# Patient Record
Sex: Male | Born: 1935 | Race: White | Hispanic: No | Marital: Married | Smoking: Former smoker
Health system: Southern US, Community
[De-identification: ages and names within clinical notes are randomized; demographics above are authoritative.]

## PROBLEM LIST (undated history)

## (undated) DIAGNOSIS — N183 Chronic kidney disease, stage 3 unspecified: Secondary | ICD-10-CM

## (undated) DIAGNOSIS — E119 Type 2 diabetes mellitus without complications: Secondary | ICD-10-CM

## (undated) DIAGNOSIS — I2510 Atherosclerotic heart disease of native coronary artery without angina pectoris: Secondary | ICD-10-CM

## (undated) DIAGNOSIS — IMO0001 Reserved for inherently not codable concepts without codable children: Secondary | ICD-10-CM

## (undated) DIAGNOSIS — I10 Essential (primary) hypertension: Secondary | ICD-10-CM

## (undated) DIAGNOSIS — I499 Cardiac arrhythmia, unspecified: Secondary | ICD-10-CM

## (undated) DIAGNOSIS — M2550 Pain in unspecified joint: Secondary | ICD-10-CM

## (undated) DIAGNOSIS — G8929 Other chronic pain: Secondary | ICD-10-CM

## (undated) DIAGNOSIS — I4891 Unspecified atrial fibrillation: Secondary | ICD-10-CM

## (undated) DIAGNOSIS — I25119 Atherosclerotic heart disease of native coronary artery with unspecified angina pectoris: Secondary | ICD-10-CM

## (undated) DIAGNOSIS — D649 Anemia, unspecified: Secondary | ICD-10-CM

## (undated) DIAGNOSIS — M2540 Effusion, unspecified joint: Secondary | ICD-10-CM

## (undated) DIAGNOSIS — I509 Heart failure, unspecified: Secondary | ICD-10-CM

## (undated) DIAGNOSIS — H9190 Unspecified hearing loss, unspecified ear: Secondary | ICD-10-CM

## (undated) DIAGNOSIS — J45909 Unspecified asthma, uncomplicated: Secondary | ICD-10-CM

## (undated) DIAGNOSIS — Z8679 Personal history of other diseases of the circulatory system: Secondary | ICD-10-CM

## (undated) DIAGNOSIS — J449 Chronic obstructive pulmonary disease, unspecified: Secondary | ICD-10-CM

## (undated) DIAGNOSIS — M069 Rheumatoid arthritis, unspecified: Secondary | ICD-10-CM

## (undated) DIAGNOSIS — Z951 Presence of aortocoronary bypass graft: Secondary | ICD-10-CM

## (undated) DIAGNOSIS — G629 Polyneuropathy, unspecified: Secondary | ICD-10-CM

## (undated) DIAGNOSIS — E785 Hyperlipidemia, unspecified: Secondary | ICD-10-CM

## (undated) DIAGNOSIS — Z9889 Other specified postprocedural states: Secondary | ICD-10-CM

## (undated) DIAGNOSIS — J189 Pneumonia, unspecified organism: Secondary | ICD-10-CM

## (undated) DIAGNOSIS — K219 Gastro-esophageal reflux disease without esophagitis: Secondary | ICD-10-CM

## (undated) DIAGNOSIS — E039 Hypothyroidism, unspecified: Secondary | ICD-10-CM

## (undated) DIAGNOSIS — K5900 Constipation, unspecified: Secondary | ICD-10-CM

## (undated) DIAGNOSIS — Z9289 Personal history of other medical treatment: Secondary | ICD-10-CM

## (undated) HISTORY — PX: COLONOSCOPY: SHX174

## (undated) HISTORY — DX: Hyperlipidemia, unspecified: E78.5

## (undated) HISTORY — PX: SHOULDER ARTHROSCOPY W/ ROTATOR CUFF REPAIR: SHX2400

## (undated) HISTORY — PX: REPAIR TENDONS FOOT: SUR1209

## (undated) HISTORY — DX: Atherosclerotic heart disease of native coronary artery with unspecified angina pectoris: I25.119

## (undated) HISTORY — PX: JOINT REPLACEMENT: SHX530

## (undated) HISTORY — PX: CARPAL TUNNEL RELEASE: SHX101

## (undated) HISTORY — DX: Anemia, unspecified: D64.9

## (undated) HISTORY — PX: CATARACT EXTRACTION W/ INTRAOCULAR LENS  IMPLANT, BILATERAL: SHX1307

## (undated) HISTORY — DX: Heart failure, unspecified: I50.9

## (undated) HISTORY — PX: TONSILLECTOMY: SUR1361

---

## 1997-07-26 ENCOUNTER — Encounter: Admission: RE | Admit: 1997-07-26 | Discharge: 1997-10-24 | Payer: Self-pay | Admitting: Internal Medicine

## 1997-12-26 ENCOUNTER — Encounter: Admission: RE | Admit: 1997-12-26 | Discharge: 1998-02-02 | Payer: Self-pay | Admitting: Internal Medicine

## 1998-03-30 ENCOUNTER — Encounter: Admission: RE | Admit: 1998-03-30 | Discharge: 1998-04-05 | Payer: Self-pay | Admitting: Orthopedic Surgery

## 1998-07-10 ENCOUNTER — Encounter: Admission: RE | Admit: 1998-07-10 | Discharge: 1998-08-13 | Payer: Self-pay | Admitting: Orthopedic Surgery

## 1998-12-24 ENCOUNTER — Encounter: Admission: RE | Admit: 1998-12-24 | Discharge: 1999-03-24 | Payer: Self-pay | Admitting: Orthopedic Surgery

## 1999-04-03 ENCOUNTER — Encounter: Admission: RE | Admit: 1999-04-03 | Discharge: 1999-07-02 | Payer: Self-pay | Admitting: Internal Medicine

## 1999-09-04 ENCOUNTER — Encounter: Admission: RE | Admit: 1999-09-04 | Discharge: 1999-12-03 | Payer: Self-pay | Admitting: Orthopedic Surgery

## 1999-12-25 ENCOUNTER — Encounter: Admission: RE | Admit: 1999-12-25 | Discharge: 2000-03-24 | Payer: Self-pay | Admitting: Internal Medicine

## 2000-04-15 ENCOUNTER — Encounter: Admission: RE | Admit: 2000-04-15 | Discharge: 2000-04-20 | Payer: Self-pay | Admitting: Internal Medicine

## 2000-06-10 ENCOUNTER — Encounter: Admission: RE | Admit: 2000-06-10 | Discharge: 2000-06-29 | Payer: Self-pay | Admitting: Internal Medicine

## 2000-10-27 ENCOUNTER — Encounter: Admission: RE | Admit: 2000-10-27 | Discharge: 2001-01-25 | Payer: Self-pay | Admitting: Orthopedic Surgery

## 2001-02-13 ENCOUNTER — Encounter: Admission: RE | Admit: 2001-02-13 | Discharge: 2001-03-01 | Payer: Self-pay | Admitting: Orthopedic Surgery

## 2001-05-05 ENCOUNTER — Encounter (HOSPITAL_BASED_OUTPATIENT_CLINIC_OR_DEPARTMENT_OTHER): Admission: RE | Admit: 2001-05-05 | Discharge: 2001-05-21 | Payer: Self-pay | Admitting: Orthopedic Surgery

## 2001-08-30 ENCOUNTER — Encounter (HOSPITAL_BASED_OUTPATIENT_CLINIC_OR_DEPARTMENT_OTHER): Admission: RE | Admit: 2001-08-30 | Discharge: 2001-09-03 | Payer: Self-pay | Admitting: Internal Medicine

## 2001-12-06 ENCOUNTER — Encounter (HOSPITAL_BASED_OUTPATIENT_CLINIC_OR_DEPARTMENT_OTHER): Admission: RE | Admit: 2001-12-06 | Discharge: 2002-03-06 | Payer: Self-pay | Admitting: Internal Medicine

## 2002-03-15 ENCOUNTER — Encounter (HOSPITAL_BASED_OUTPATIENT_CLINIC_OR_DEPARTMENT_OTHER): Admission: RE | Admit: 2002-03-15 | Discharge: 2002-06-13 | Payer: Self-pay | Admitting: Internal Medicine

## 2002-06-16 ENCOUNTER — Encounter (HOSPITAL_BASED_OUTPATIENT_CLINIC_OR_DEPARTMENT_OTHER): Admission: RE | Admit: 2002-06-16 | Discharge: 2002-09-14 | Payer: Self-pay | Admitting: Internal Medicine

## 2002-09-22 ENCOUNTER — Encounter (HOSPITAL_BASED_OUTPATIENT_CLINIC_OR_DEPARTMENT_OTHER): Admission: RE | Admit: 2002-09-22 | Discharge: 2002-10-05 | Payer: Self-pay | Admitting: Internal Medicine

## 2003-03-07 ENCOUNTER — Encounter (HOSPITAL_BASED_OUTPATIENT_CLINIC_OR_DEPARTMENT_OTHER): Admission: RE | Admit: 2003-03-07 | Discharge: 2003-03-09 | Payer: Self-pay | Admitting: Internal Medicine

## 2003-06-08 ENCOUNTER — Encounter (HOSPITAL_BASED_OUTPATIENT_CLINIC_OR_DEPARTMENT_OTHER): Admission: RE | Admit: 2003-06-08 | Discharge: 2003-06-21 | Payer: Self-pay | Admitting: Internal Medicine

## 2003-08-22 ENCOUNTER — Encounter: Admission: RE | Admit: 2003-08-22 | Discharge: 2003-08-22 | Payer: Self-pay | Admitting: Orthopedic Surgery

## 2003-08-23 ENCOUNTER — Encounter: Admission: RE | Admit: 2003-08-23 | Discharge: 2003-08-23 | Payer: Self-pay | Admitting: Orthopedic Surgery

## 2003-08-24 ENCOUNTER — Ambulatory Visit (HOSPITAL_COMMUNITY): Admission: RE | Admit: 2003-08-24 | Discharge: 2003-08-24 | Payer: Self-pay | Admitting: Orthopedic Surgery

## 2003-08-24 ENCOUNTER — Ambulatory Visit (HOSPITAL_BASED_OUTPATIENT_CLINIC_OR_DEPARTMENT_OTHER): Admission: RE | Admit: 2003-08-24 | Discharge: 2003-08-24 | Payer: Self-pay | Admitting: Orthopedic Surgery

## 2003-09-14 ENCOUNTER — Encounter (HOSPITAL_BASED_OUTPATIENT_CLINIC_OR_DEPARTMENT_OTHER): Admission: RE | Admit: 2003-09-14 | Discharge: 2003-09-29 | Payer: Self-pay | Admitting: Internal Medicine

## 2003-12-11 ENCOUNTER — Encounter: Admission: RE | Admit: 2003-12-11 | Discharge: 2003-12-11 | Payer: Self-pay | Admitting: *Deleted

## 2004-02-13 ENCOUNTER — Encounter (HOSPITAL_BASED_OUTPATIENT_CLINIC_OR_DEPARTMENT_OTHER): Admission: RE | Admit: 2004-02-13 | Discharge: 2004-02-19 | Payer: Self-pay | Admitting: Internal Medicine

## 2004-07-08 ENCOUNTER — Encounter: Admission: RE | Admit: 2004-07-08 | Discharge: 2004-07-08 | Payer: Self-pay | Admitting: *Deleted

## 2004-11-14 ENCOUNTER — Ambulatory Visit (HOSPITAL_COMMUNITY): Admission: RE | Admit: 2004-11-14 | Discharge: 2004-11-14 | Payer: Self-pay | Admitting: *Deleted

## 2005-04-24 ENCOUNTER — Ambulatory Visit: Payer: Self-pay | Admitting: Critical Care Medicine

## 2005-06-16 ENCOUNTER — Ambulatory Visit: Payer: Self-pay | Admitting: Internal Medicine

## 2005-10-23 ENCOUNTER — Ambulatory Visit: Payer: Self-pay | Admitting: Critical Care Medicine

## 2006-05-20 ENCOUNTER — Ambulatory Visit: Payer: Self-pay | Admitting: Critical Care Medicine

## 2006-12-07 ENCOUNTER — Encounter: Admission: RE | Admit: 2006-12-07 | Discharge: 2006-12-07 | Payer: Self-pay | Admitting: Orthopedic Surgery

## 2010-02-02 ENCOUNTER — Encounter: Payer: Self-pay | Admitting: *Deleted

## 2010-02-03 ENCOUNTER — Encounter: Payer: Self-pay | Admitting: *Deleted

## 2010-03-14 HISTORY — PX: TOTAL SHOULDER REPLACEMENT: SUR1217

## 2010-03-19 ENCOUNTER — Encounter (HOSPITAL_COMMUNITY)
Admission: RE | Admit: 2010-03-19 | Discharge: 2010-03-19 | Disposition: A | Discharge status: Home / Self Care | Payer: Medicare Other | Source: Ambulatory Visit | Attending: Orthopedic Surgery | Admitting: Orthopedic Surgery

## 2010-03-19 LAB — COMPREHENSIVE METABOLIC PANEL
ALT: 23 U/L (ref 0–53)
AST: 21 U/L (ref 0–37)
Albumin: 3.7 g/dL (ref 3.5–5.2)
Alkaline Phosphatase: 65 U/L (ref 39–117)
BUN: 21 mg/dL (ref 6–23)
CO2: 27 mEq/L (ref 19–32)
Calcium: 9.5 mg/dL (ref 8.4–10.5)
Chloride: 106 mEq/L (ref 96–112)
Creatinine, Ser: 1.53 mg/dL — ABNORMAL HIGH (ref 0.4–1.5)
GFR calc Af Amer: 54 mL/min — ABNORMAL LOW (ref >60)
GFR calc non Af Amer: 44 mL/min — ABNORMAL LOW (ref >60)
Glucose, Bld: 157 mg/dL — ABNORMAL HIGH (ref 70–99)
Potassium: 4 mEq/L (ref 3.5–5.1)
Sodium: 139 mEq/L (ref 135–145)
Total Bilirubin: 0.6 mg/dL (ref 0.3–1.2)
Total Protein: 6 g/dL (ref 6.0–8.3)

## 2010-03-19 LAB — CBC
HCT: 35.3 % — ABNORMAL LOW (ref 39.0–52.0)
Hemoglobin: 12.5 g/dL — ABNORMAL LOW (ref 13.0–17.0)
MCH: 32.2 pg (ref 26.0–34.0)
MCHC: 35.4 g/dL (ref 30.0–36.0)
MCV: 91 fL (ref 78.0–100.0)
Platelets: 192 10*3/uL (ref 150–400)
RBC: 3.88 MIL/uL — ABNORMAL LOW (ref 4.22–5.81)
RDW: 14.4 % (ref 11.5–15.5)
WBC: 6.8 10*3/uL (ref 4.0–10.5)

## 2010-03-19 LAB — PROTIME-INR
INR: 0.97 (ref 0.00–1.49)
Prothrombin Time: 13.1 seconds (ref 11.6–15.2)

## 2010-03-19 LAB — ABO/RH: ABO/RH(D): A POS

## 2010-03-19 LAB — URINALYSIS, ROUTINE W REFLEX MICROSCOPIC
Bilirubin Urine: NEGATIVE
Glucose, UA: NEGATIVE mg/dL
Hgb urine dipstick: NEGATIVE
Ketones, ur: NEGATIVE mg/dL
Nitrite: NEGATIVE
Protein, ur: NEGATIVE mg/dL
Specific Gravity, Urine: 1.012 (ref 1.005–1.030)
Urobilinogen, UA: 0.2 mg/dL (ref 0.0–1.0)
pH: 5.5 (ref 5.0–8.0)

## 2010-03-19 LAB — DIFFERENTIAL
Basophils Absolute: 0 10*3/uL (ref 0.0–0.1)
Basophils Relative: 0 % (ref 0–1)
Eosinophils Absolute: 0.1 10*3/uL (ref 0.0–0.7)
Eosinophils Relative: 2 % (ref 0–5)
Lymphocytes Relative: 35 % (ref 12–46)
Lymphs Abs: 2.4 10*3/uL (ref 0.7–4.0)
Monocytes Absolute: 0.6 10*3/uL (ref 0.1–1.0)
Monocytes Relative: 8 % (ref 3–12)
Neutro Abs: 3.7 10*3/uL (ref 1.7–7.7)
Neutrophils Relative %: 54 % (ref 43–77)

## 2010-03-19 LAB — CROSSMATCH
ABO/RH(D): A POS
Antibody Screen: NEGATIVE

## 2010-03-19 LAB — SURGICAL PCR SCREEN
MRSA, PCR: NEGATIVE
Staphylococcus aureus: NEGATIVE

## 2010-03-19 LAB — APTT: aPTT: 28 seconds (ref 24–37)

## 2010-03-20 LAB — URINE CULTURE
Colony Count: NO GROWTH
Culture  Setup Time: 201203061647
Culture: NO GROWTH

## 2010-03-25 ENCOUNTER — Other Ambulatory Visit (HOSPITAL_COMMUNITY): Payer: Self-pay | Admitting: Orthopedic Surgery

## 2010-03-25 ENCOUNTER — Inpatient Hospital Stay (HOSPITAL_COMMUNITY)
Admission: RE | Admit: 2010-03-25 | Discharge: 2010-03-26 | DRG: 483 | Disposition: A | Discharge status: Home Health | Payer: Medicare Other | Source: Ambulatory Visit | Attending: Orthopedic Surgery | Admitting: Orthopedic Surgery

## 2010-03-25 ENCOUNTER — Ambulatory Visit (HOSPITAL_COMMUNITY)
Admission: RE | Admit: 2010-03-25 | Discharge: 2010-03-25 | Disposition: A | Discharge status: Home / Self Care | Payer: Medicare Other | Source: Ambulatory Visit | Attending: Orthopedic Surgery | Admitting: Orthopedic Surgery

## 2010-03-25 DIAGNOSIS — M19012 Primary osteoarthritis, left shoulder: Secondary | ICD-10-CM

## 2010-03-25 DIAGNOSIS — D62 Acute posthemorrhagic anemia: Secondary | ICD-10-CM | POA: Diagnosis not present

## 2010-03-25 DIAGNOSIS — I10 Essential (primary) hypertension: Secondary | ICD-10-CM | POA: Diagnosis present

## 2010-03-25 DIAGNOSIS — M19019 Primary osteoarthritis, unspecified shoulder: Principal | ICD-10-CM | POA: Diagnosis present

## 2010-03-25 DIAGNOSIS — E119 Type 2 diabetes mellitus without complications: Secondary | ICD-10-CM | POA: Diagnosis present

## 2010-03-25 LAB — GLUCOSE, CAPILLARY
Glucose-Capillary: 170 mg/dL — ABNORMAL HIGH (ref 70–99)
Glucose-Capillary: 194 mg/dL — ABNORMAL HIGH (ref 70–99)
Glucose-Capillary: 185 mg/dL — ABNORMAL HIGH (ref 70–99)
Glucose-Capillary: 154 mg/dL — ABNORMAL HIGH (ref 70–99)

## 2010-03-26 LAB — BASIC METABOLIC PANEL
BUN: 18 mg/dL (ref 6–23)
CO2: 27 mEq/L (ref 19–32)
Calcium: 8.5 mg/dL (ref 8.4–10.5)
Chloride: 102 mEq/L (ref 96–112)
Creatinine, Ser: 1.26 mg/dL (ref 0.4–1.5)
GFR calc Af Amer: >60 mL/min (ref >60)
GFR calc non Af Amer: 56 mL/min — ABNORMAL LOW (ref >60)
Glucose, Bld: 178 mg/dL — ABNORMAL HIGH (ref 70–99)
Potassium: 3.6 mEq/L (ref 3.5–5.1)
Sodium: 136 mEq/L (ref 135–145)

## 2010-03-26 LAB — CBC
HCT: 29.3 % — ABNORMAL LOW (ref 39.0–52.0)
Hemoglobin: 10.2 g/dL — ABNORMAL LOW (ref 13.0–17.0)
MCH: 31.5 pg (ref 26.0–34.0)
MCHC: 34.8 g/dL (ref 30.0–36.0)
MCV: 90.4 fL (ref 78.0–100.0)
Platelets: 145 10*3/uL — ABNORMAL LOW (ref 150–400)
RBC: 3.24 MIL/uL — ABNORMAL LOW (ref 4.22–5.81)
RDW: 14.4 % (ref 11.5–15.5)
WBC: 8.3 10*3/uL (ref 4.0–10.5)

## 2010-03-26 LAB — GLUCOSE, CAPILLARY: Glucose-Capillary: 166 mg/dL — ABNORMAL HIGH (ref 70–99)

## 2010-03-26 LAB — HEMOGLOBIN A1C: Hgb A1c MFr Bld: 6.1 % — ABNORMAL HIGH (ref <5.7)

## 2010-03-27 NOTE — Op Note (Signed)
  NAME:  [name], [name] NO.:  [id number]  MEDICAL RECORD NO.:  [id number]           PATIENT TYPE:  I  LOCATION:  5016                         FACILITY:  [facility]  PHYSICIAN:  Mila Homer. Sherlean Foot, M.D. DATE OF BIRTH:  05-12-1930  DATE OF PROCEDURE:  03/25/2010 DATE OF DISCHARGE:                              OPERATIVE REPORT   SURGEON:  Mila Homer. Sherlean Foot, MD  ASSISTANT:  Altamese Cabal, PA-C  ANESTHESIA:  General.  PREOPERATIVE DIAGNOSIS:  Left shoulder arthritis.  POSTOPERATIVE DIAGNOSIS:  Left shoulder arthritis.  PROCEDURE:  Left shoulder hemiarthroplasty.  INDICATION FOR PROCEDURE:  The patient is a 77 year old white male with failure to conservative measures.  Informed consent was obtained.  DESCRIPTION OF THE PROCEDURE:  The patient was laid supine, administered general anesthesia and placed in beach-chair position.  Left shoulder was prepped and draped in usual sterile fashion.  Anterior incision was made from the [facility] joint down to the insertion of the deltoid.  Cautery dissection was continued through the deltopectoral interval.  I then placed the Kolbel retractor in place.  I then incised the subscap and supraspinatus through the rotator cuff interval and tagged with stay sutures of #2 Tevdek.  I externally rotated and removed the femoral head out of the socket.  I then used the intramedullary canal finder and reamed to 16 and then cut the head in 30 degrees of inversion.  This was a 26-mm thick piece.  I then turned my attention to the glenoid.  The glenoid was very large, very smooth.  I do think some of it was eburnated bone; however, it was much larger than the largest prosthetic option.  I elected therefore to leave it alone.  I then turned my attention back to the humeral side.  I broached to a size 16, and then trialed with various head sizes choosing a +27 option eccentric head which covered the cut surface nicely.  This afforded  excellent stability.  I chose these components, copiously irrigated, and then tamped down a size 16 stem, placed and tamped onto a __________ Lattie Corns taper a +27 x 46 head and then irrigated.  I closed the rotator cuff with figure-of-eight #2 Tevdek.  I then irrigated again and closed the deltopectoral interval with 0 Vicryl, subcuticular with 2-0 Vicryls, and skin with staples.  I dressed with Xeroform, dry sponges, sterile Webril, and shoulder dressings.  COMPLICATIONS:  None.  DRAINS:  None.          ______________________________ Mila Homer. Sherlean Foot, M.D.     SDL/MEDQ  D:  03/25/2010  T:  03/26/2010  Job:  [id number]  Electronically Signed by Georgena Spurling M.D. on 03/27/2010 11:29:18 AM

## 2010-05-31 NOTE — Assessment & Plan Note (Signed)
[facility]                               PULMONARY OFFICE NOTE   NAME:[name]                         MRN:          [id number]  DATE:10/23/2005                            DOB:          1931-01-03   [name] is a 77 year old white male with history of chronic dyspnea on the  basis of restrictive lung disease. Has bilateral pulmonary nodules which  were benign in nature. This did not change since 2005.  The last CT scan in  June 2007 was unchanged.  Pulmonary functions have shown restrictive defect.  He had been on a p.r.n. nebulizer for treatment but does not use this at  this time.   CURRENT MEDICATIONS:  1. Medrol 2 mg daily.  2. Metformini 1000 mg daily.  3. Methotrexate 2.5 mg daily.  4. Folic acid 1 mg daily.  5. Terazosin 5 mg daily.  6. Diltiazem 260 mg daily.  7. Hydrochlorothiazide 25 mg daily.  8. Lisinopril 20 mg daily.  9. Lovastatin 20 mg daily.  10.Ranitidine 150 mg b.i.d.  11.Glipizide 5 mg daily.  12.Hydroxychlorazine 200 mg daily.   PHYSICAL EXAMINATION:  VITAL SIGNS:  Temperature  97.5, blood pressure  115/68, pulse 77, saturation 95% on room air.  CHEST:  Distant breath sounds.  No wheeze or rhonchi noted.  CARDIAC:  Regular rate and rhythm without S3.  Normal S1 and S2.  ABDOMEN:  Soft, nontender.  EXTREMITIES:  No edema or clubbing.  SKIN:  Clear.  NEUROLOGIC:  Intact.   LABORATORY DATA:  None.   IMPRESSION:  Stable pulmonary nodules with rheumatoid arthritis and  restrictive defect with morbid obesity.   PLAN:  Maintain current medication profile without change.  The patient will  receive a flu vaccine today in the office.  Will see the patient back in six  months time for followup.      Charlcie Cradle Delford Field, MD, FCCP   PEW/MedQ DD:  10/23/2005 DT:  10/25/2005 Job #:  [id number]

## 2010-05-31 NOTE — Op Note (Signed)
NAME:  [name], [name]                            ACCOUNT NO.:  [id number]   MEDICAL RECORD NO.:  [id number]                   PATIENT TYPE:  AMB   LOCATION:  DSC                                  FACILITY:  [facility]   PHYSICIAN:  Rodney A. Chaney Malling, M.D.           DATE OF BIRTH:  27-Mar-1930   DATE OF PROCEDURE:  08/24/2003  DATE OF DISCHARGE:                                 OPERATIVE REPORT   PREOPERATIVE DIAGNOSIS:  Torn rotator cuff tear, right shoulder.   POSTOPERATIVE DIAGNOSIS:  Frayed biceps right shoulder, frayed anterior  labrum, full thickness retracted rotator cuff tear, right shoulder.   OPERATION:  Arthroscopic evaluation, arthroscopic debridement of the biceps,  anterior glenoid labrum, and supraspinatus; open acromioplasty and open  repair of retracted rotator cuff tear using one Mitek suture.   SURGEON:  Lenard Galloway. Chaney Malling, M.D.   ANESTHESIA:  General.   PROCEDURE:  The patient was placed on the operating table in supine  position.  After satisfactory general anesthesia, the patient was placed in  a semi-seated position.  The right upper extremity was then prepped with  DuraPrep and draped out in the usual manner.  Through a posterior portal,  the arthroscope was introduced and the findings were as follows:  The  patient had fraying of the anterior labrum and significant fraying of the  biceps and a great deal of tearing of the supraspinatus with a full  thickness tear of the supraspinatus.  Through an anterior operative portal,  intra-articular shaver was introduced.  The frayed biceps was debrided back  to fairly normal looking tissue and the frayed portion of the labrum was  also debrided.  The under surface of the supraspinatus was aggressively  debrided with the intra-articular shaver.  The arthroscope was then removed  and introduced in the subacromial space.  In the subacromial space, the  retracted full thickness tear of the rotator cuff could clearly be  seen.  There was exposed bone from the subacromial side where the distal attachment  of the supraspinatus had torn off.  The arthroscope was then removed.   A saber cut incision was made over the anterolateral aspect of the right  shoulder.  The skin edges were retracted.  Bleeders were coagulated.  The  deltoid was released off the anterior aspect of the acromion only for about  1 to 1.5 cm.  Access was then made to the subacromial space.  A small saw  was used to do a small anterior acromioplasty.  This gave excellent access  to the tear of the rotator cuff.  The area of the foot print was debrided  with a rongeur to bleeding point.  A Mitek stapler was inserted in this area  and sutures passed through the rotator cuff.  The supraspinatus was then  moved down to its normal anatomic position and sutured down in place.  An  excellent watertight closure was  achieved.  Throughout the procedure, the  shoulder was irrigated with large amounts of saline.  The deltoids were  reattached using 0 Vicryl, 2-0 Vicryl was used to close the subcutaneous  tissues, stainless steel staples were used to close the skin.  Technically,  this procedure went extremely well.  I was very pleased with the result  technically.  Drains were none.  Complications none.                                               Rodney A. Chaney Malling, M.D.    RAM/MEDQ  D:  08/24/2003  T:  08/24/2003  Job:  [id number]

## 2010-05-31 NOTE — Assessment & Plan Note (Signed)
[facility]                             PULMONARY OFFICE NOTE   NAME:JONESDre, Gamino                         MRN:          [id number]  DATE:05/20/2006                            DOB:          1930-02-04    Mr. Leinen returns today in followup with restrictive lung disease with  rheumatoid arthritis, diaphragm elevation, and chronic pulmonary nodules  with morbid obesity.  He has been doing quite well since his last visit  in October 2007.  He is having satisfactory breath volume and no  significant degree of shortness of breath.  He maintains Medrol 2 mg  daily.  He is on no inhaled medications.  His medication profile is  reviewed and is correct as reviewed.   PHYSICAL EXAMINATION:  VITAL SIGNS:  Temp 97.5, blood pressure 118/60,  pulse 75, saturation 96% on room air.  CHEST:  Showed diminished breath sounds with no evidence of wheeze or  rhonchi.  CARDIAC:  Showed a regular rate and rhythm without S3, normal S1 S2.   IMPRESSION:  Restrictive disease on the basis of rheumatoid arthritis.   PLAN:  Maintain current medication profile as-is and no change in  pulmonary plan of care is made.  We will see the patient back as needed.     Charlcie Cradle Delford Field, MD, [facility]  Electronically Signed    PEW/MedQ  DD: 05/21/2006  DT: 05/21/2006  Job #: [id number]

## 2010-12-02 ENCOUNTER — Encounter (HOSPITAL_COMMUNITY): Payer: Self-pay | Admitting: Pharmacy Technician

## 2010-12-02 ENCOUNTER — Other Ambulatory Visit: Payer: Self-pay | Admitting: Orthopedic Surgery

## 2010-12-02 NOTE — H&P (Signed)
[name] MRN:  [id number] DOB/SEX:  Jul 15, 1930/male  CHIEF COMPLAINT:  Painful right Knee  HISTORY: Patient is a 77 y.o. male presented with a history of pain in the right knee. Onset of symptoms was gradual starting several years ago with gradually worsening course since that time. The patient noted no past surgery on the right knee. Patient has been treated conservatively with over-the-counter NSAIDs and activity modification. Patient currently rates pain in the knee at 8 out of 10 with activity. There is pain at night.  PAST MEDICAL HISTORY: There are no active problems to display for this patient.  No past medical history on file. No past surgical history on file.   MEDICATIONS:   (Not in a hospital admission)  ALLERGIES:  Allergies not on file  REVIEW OF SYSTEMS:  Pertinent items are noted in HPI.   FAMILY HISTORY:  No family history on file.  SOCIAL HISTORY:   History  Substance Use Topics  . Smoking status: Not on file  . Smokeless tobacco: Not on file  . Alcohol Use: Not on file     EXAMINATION:  Vital signs in last 24 hours: @VSRANGES @  There were no vitals taken for this visit.  General Appearance:    Alert, cooperative, no distress, appears stated age  Head:    Normocephalic, without obvious abnormality, atraumatic  Eyes:    PERRLA   Ears:    Nose:   Nares normal, septum midline, mucosa normal, no drainage    or sinus tenderness  Throat:   Lips, mucosa, and tongue normal; teeth and gums normal  Neck:   Supple, symmetrical, trachea midline, no adenopathy;       thyroid:  No enlargement/tenderness/nodules; no carotid   bruit or JVD  Back:     Symmetric, no curvature, ROM normal, no CVA tenderness  Lungs:     Clear to auscultation bilaterally, respirations unlabored  Chest wall:    No tenderness or deformity  Heart:    Regular rate and rhythm, S1 and S2 normal, no murmur, rub   or gallop  Abdomen:     Soft, non-tender, bowel sounds active all four  quadrants,    no masses, no organomegaly  Genitalia:    Rectal:    Extremities:   Extremities normal, atraumatic, no cyanosis   Pulses:   2+ and symmetric all extremities  Skin:   Skin color, texture, turgor normal, no rashes or lesions  Lymph nodes:   Cervical, supraclavicular, and axillary nodes normal  Neurologic:   CNII-XII intact. Normal strength, sensation and reflexes      throughout    Musculoskeletal:  ROM 0-105, Ligaments intact, positive crepitus Imaging Review Plain radiographs demonstrate severe degenerative joint disease of the right knee. The overall alignment is mild varus. The bone quality appears to be fair for age and reported activity level.  Assessment/Plan: End stage arthritis, right knee   The patient history, physical examination and imaging studies are consistent with advanced degenerative joint disease of the right knee. The patient has failed conservative treatment.  The clearance notes were reviewed.  After discussion with the patient it was felt that Total Knee Replacement was indicated. The procedure,  risks, and benefits of total knee arthroplasty were presented and reviewed. The risks including but not limited to aseptic loosening, infection, blood clots, vascular injury, stiffness, patella tracking problems complications among others were discussed. The patient acknowledged the explanation, agreed to proceed with the plan.  Scheurich,Lyndsy Gilberto 12/02/2010, 11:37 AM

## 2010-12-11 ENCOUNTER — Encounter (HOSPITAL_COMMUNITY): Payer: Self-pay

## 2010-12-11 ENCOUNTER — Encounter (HOSPITAL_COMMUNITY)
Admission: RE | Admit: 2010-12-11 | Discharge: 2010-12-11 | Disposition: A | Discharge status: Home / Self Care | Payer: Medicare Other | Source: Ambulatory Visit | Attending: Orthopedic Surgery | Admitting: Orthopedic Surgery

## 2010-12-11 HISTORY — DX: Essential (primary) hypertension: I10

## 2010-12-11 HISTORY — DX: Pneumonia, unspecified organism: J18.9

## 2010-12-11 HISTORY — DX: Gastro-esophageal reflux disease without esophagitis: K21.9

## 2010-12-11 LAB — DIFFERENTIAL
Basophils Absolute: 0 10*3/uL (ref 0.0–0.1)
Basophils Relative: 0 % (ref 0–1)
Eosinophils Absolute: 0.2 10*3/uL (ref 0.0–0.7)
Eosinophils Relative: 3 % (ref 0–5)
Monocytes Absolute: 0.7 10*3/uL (ref 0.1–1.0)
Monocytes Relative: 10 % (ref 3–12)

## 2010-12-11 LAB — CBC
HCT: 36.2 % — ABNORMAL LOW (ref 39.0–52.0)
MCV: 88.1 fL (ref 78.0–100.0)
Platelets: 154 10*3/uL (ref 150–400)
RBC: 4.11 MIL/uL — ABNORMAL LOW (ref 4.22–5.81)
RDW: 14.1 % (ref 11.5–15.5)
WBC: 7 10*3/uL (ref 4.0–10.5)

## 2010-12-11 LAB — SURGICAL PCR SCREEN: MRSA, PCR: NEGATIVE

## 2010-12-11 LAB — COMPREHENSIVE METABOLIC PANEL
AST: 17 U/L (ref 0–37)
Albumin: 3.4 g/dL — ABNORMAL LOW (ref 3.5–5.2)
Alkaline Phosphatase: 85 U/L (ref 39–117)
BUN: 26 mg/dL — ABNORMAL HIGH (ref 6–23)
CO2: 25 mEq/L (ref 19–32)
Chloride: 102 mEq/L (ref 96–112)
GFR calc non Af Amer: 50 mL/min — ABNORMAL LOW (ref >90)
Potassium: 3.8 mEq/L (ref 3.5–5.1)
Total Bilirubin: 0.3 mg/dL (ref 0.3–1.2)

## 2010-12-11 LAB — URINALYSIS, ROUTINE W REFLEX MICROSCOPIC
Ketones, ur: NEGATIVE mg/dL
Leukocytes, UA: NEGATIVE
Nitrite: NEGATIVE
Specific Gravity, Urine: 1.014 (ref 1.005–1.030)
pH: 5.5 (ref 5.0–8.0)

## 2010-12-11 LAB — APTT: aPTT: 27 seconds (ref 24–37)

## 2010-12-11 NOTE — Progress Notes (Signed)
Ekg and CXR requested.

## 2010-12-11 NOTE — Progress Notes (Signed)
Patient states EKG was done at Dr. Trula Ore Cox's office 2-3 weeks ago and CXR was done at [facility] 2-3 weeks ago.

## 2010-12-11 NOTE — Pre-Procedure Instructions (Signed)
20 [name]  12/11/2010   Your procedure is scheduled on:  Monday December 16, 2010  Report to [facility] at 10am AM.  Call this number if you have problems the morning of surgery: [phone]   Remember:   Do not eat food:After Midnight.  May have clear liquids: up to 4 Hours before arrival.6:00am  Clear liquids include soda, tea, black coffee, apple or grape juice, broth.  Take these medicines the morning of surgery with A SIP OF WATER: cardizem, zantac   Do not wear jewelry, make-up or nail polish.  Do not wear lotions, powders, or perfumes. You may wear deodorant.  Do not shave 48 hours prior to surgery.  Do not bring valuables to the hospital.  Contacts, dentures or bridgework may not be worn into surgery.  Leave suitcase in the car. After surgery it may be brought to your room.  For patients admitted to the hospital, checkout time is 11:00 AM the day of discharge.   Patients discharged the day of surgery will not be allowed to drive home.  Name and phone number of your driver: [name] [phone]  Special Instructions: CHG Shower Use Special Wash: 1/2 bottle night before surgery and 1/2 bottle morning of surgery.   Please read over the following fact sheets that you were given: Pain Booklet, Coughing and Deep Breathing, Blood Transfusion Information, MRSA Information and Surgical Site Infection Prevention

## 2010-12-12 LAB — URINE CULTURE: Culture  Setup Time: 201211281149

## 2010-12-15 MED ORDER — CEFAZOLIN SODIUM-DEXTROSE 2-3 GM-% IV SOLR
2.0000 g | INTRAVENOUS | Status: AC
  Administered 2010-12-16: 2 g via INTRAVENOUS
  Filled 2010-12-15: qty 50

## 2010-12-16 ENCOUNTER — Other Ambulatory Visit: Payer: Self-pay

## 2010-12-16 ENCOUNTER — Encounter (HOSPITAL_COMMUNITY): Payer: Self-pay | Admitting: Anesthesiology

## 2010-12-16 ENCOUNTER — Encounter (HOSPITAL_COMMUNITY)
Admission: RE | Disposition: A | Discharge status: Home Health | Payer: Self-pay | Source: Ambulatory Visit | Attending: Orthopedic Surgery

## 2010-12-16 ENCOUNTER — Inpatient Hospital Stay (HOSPITAL_COMMUNITY): Payer: Medicare Other | Admitting: Anesthesiology

## 2010-12-16 ENCOUNTER — Encounter (HOSPITAL_COMMUNITY): Payer: Self-pay | Admitting: *Deleted

## 2010-12-16 ENCOUNTER — Inpatient Hospital Stay (HOSPITAL_COMMUNITY)
Admission: RE | Admit: 2010-12-16 | Discharge: 2010-12-20 | DRG: 470 | Disposition: A | Discharge status: Home Health | Payer: Medicare Other | Source: Ambulatory Visit | Attending: Orthopedic Surgery | Admitting: Orthopedic Surgery

## 2010-12-16 DIAGNOSIS — Z961 Presence of intraocular lens: Secondary | ICD-10-CM

## 2010-12-16 DIAGNOSIS — E119 Type 2 diabetes mellitus without complications: Secondary | ICD-10-CM | POA: Diagnosis present

## 2010-12-16 DIAGNOSIS — I10 Essential (primary) hypertension: Secondary | ICD-10-CM | POA: Diagnosis present

## 2010-12-16 DIAGNOSIS — K219 Gastro-esophageal reflux disease without esophagitis: Secondary | ICD-10-CM | POA: Diagnosis present

## 2010-12-16 DIAGNOSIS — Z79899 Other long term (current) drug therapy: Secondary | ICD-10-CM

## 2010-12-16 DIAGNOSIS — M1710 Unilateral primary osteoarthritis, unspecified knee: Principal | ICD-10-CM | POA: Diagnosis present

## 2010-12-16 DIAGNOSIS — D62 Acute posthemorrhagic anemia: Secondary | ICD-10-CM | POA: Diagnosis not present

## 2010-12-16 DIAGNOSIS — M1711 Unilateral primary osteoarthritis, right knee: Secondary | ICD-10-CM

## 2010-12-16 DIAGNOSIS — M069 Rheumatoid arthritis, unspecified: Secondary | ICD-10-CM | POA: Diagnosis present

## 2010-12-16 HISTORY — PX: TOTAL KNEE ARTHROPLASTY: SHX125

## 2010-12-16 LAB — HEMOGLOBIN A1C
Hgb A1c MFr Bld: 6.7 % — ABNORMAL HIGH (ref <5.7)
Mean Plasma Glucose: 146 mg/dL — ABNORMAL HIGH (ref <117)

## 2010-12-16 LAB — GLUCOSE, CAPILLARY
Glucose-Capillary: 152 mg/dL — ABNORMAL HIGH (ref 70–99)
Glucose-Capillary: 212 mg/dL — ABNORMAL HIGH (ref 70–99)

## 2010-12-16 SURGERY — ARTHROPLASTY, KNEE, TOTAL
Anesthesia: Regional | Site: Knee | Laterality: Right | Wound class: Clean

## 2010-12-16 MED ORDER — ONDANSETRON HCL 4 MG/2ML IJ SOLN
4.0000 mg | Freq: Four times a day (QID) | INTRAMUSCULAR | Status: DC | PRN
  Administered 2010-12-17: 4 mg via INTRAVENOUS
  Filled 2010-12-16: qty 2

## 2010-12-16 MED ORDER — INSULIN ASPART 100 UNIT/ML ~~LOC~~ SOLN
0.0000 [IU] | Freq: Three times a day (TID) | SUBCUTANEOUS | Status: DC
  Administered 2010-12-17: 2 [IU] via SUBCUTANEOUS
  Administered 2010-12-17 – 2010-12-20 (×9): 3 [IU] via SUBCUTANEOUS
  Administered 2010-12-20: 2 [IU] via SUBCUTANEOUS
  Filled 2010-12-16: qty 3

## 2010-12-16 MED ORDER — OXYCODONE HCL 5 MG PO TABS
5.0000 mg | ORAL_TABLET | ORAL | Status: DC | PRN
  Administered 2010-12-16 (×2): 5 mg via ORAL
  Administered 2010-12-16 – 2010-12-17 (×4): 10 mg via ORAL
  Filled 2010-12-16: qty 1
  Filled 2010-12-16 (×3): qty 2
  Filled 2010-12-16: qty 1
  Filled 2010-12-16: qty 2

## 2010-12-16 MED ORDER — BUPIVACAINE 0.25 % ON-Q PUMP SINGLE CATH 300ML
300.0000 mL | INJECTION | Status: DC
  Filled 2010-12-16: qty 300

## 2010-12-16 MED ORDER — HYDROMORPHONE HCL PF 1 MG/ML IJ SOLN
0.5000 mg | INTRAMUSCULAR | Status: DC | PRN
  Administered 2010-12-17 (×3): 1 mg via INTRAVENOUS
  Filled 2010-12-16 (×3): qty 1

## 2010-12-16 MED ORDER — BUPIVACAINE-EPINEPHRINE 0.25% -1:200000 IJ SOLN
INTRAMUSCULAR | Status: DC | PRN
  Administered 2010-12-16: 20 mL

## 2010-12-16 MED ORDER — CHLORHEXIDINE GLUCONATE 4 % EX LIQD: 60.0000 mL | Freq: Once | CUTANEOUS | Status: DC

## 2010-12-16 MED ORDER — ONDANSETRON HCL 4 MG PO TABS: 4.0000 mg | ORAL_TABLET | Freq: Four times a day (QID) | ORAL | Status: DC | PRN

## 2010-12-16 MED ORDER — ONDANSETRON HCL 4 MG/2ML IJ SOLN: 4.0000 mg | Freq: Once | INTRAMUSCULAR | Status: DC | PRN

## 2010-12-16 MED ORDER — METHOCARBAMOL 500 MG PO TABS
500.0000 mg | ORAL_TABLET | Freq: Four times a day (QID) | ORAL | Status: DC | PRN
  Administered 2010-12-16 – 2010-12-18 (×2): 500 mg via ORAL
  Filled 2010-12-16 (×2): qty 1

## 2010-12-16 MED ORDER — METOCLOPRAMIDE HCL 5 MG/ML IJ SOLN
5.0000 mg | Freq: Three times a day (TID) | INTRAMUSCULAR | Status: DC | PRN
  Filled 2010-12-16: qty 2

## 2010-12-16 MED ORDER — HYDROXYCHLOROQUINE SULFATE 200 MG PO TABS
200.0000 mg | ORAL_TABLET | Freq: Every day | ORAL | Status: DC
  Administered 2010-12-16 – 2010-12-20 (×4): 200 mg via ORAL
  Filled 2010-12-16 (×5): qty 1

## 2010-12-16 MED ORDER — DIPHENHYDRAMINE HCL 12.5 MG/5ML PO ELIX
12.5000 mg | ORAL_SOLUTION | ORAL | Status: DC | PRN
  Administered 2010-12-17 (×2): 12.5 mg via ORAL
  Filled 2010-12-16: qty 10

## 2010-12-16 MED ORDER — ROCURONIUM BROMIDE 100 MG/10ML IV SOLN
INTRAVENOUS | Status: DC | PRN
  Administered 2010-12-16: 50 mg via INTRAVENOUS

## 2010-12-16 MED ORDER — FENTANYL CITRATE 0.05 MG/ML IJ SOLN
INTRAMUSCULAR | Status: DC | PRN
  Administered 2010-12-16 (×3): 50 ug via INTRAVENOUS
  Administered 2010-12-16: 100 ug via INTRAVENOUS

## 2010-12-16 MED ORDER — TEMAZEPAM 15 MG PO CAPS
15.0000 mg | ORAL_CAPSULE | Freq: Every day | ORAL | Status: DC
  Administered 2010-12-16 – 2010-12-19 (×3): 15 mg via ORAL
  Filled 2010-12-16 (×3): qty 1

## 2010-12-16 MED ORDER — OXYCODONE HCL 20 MG PO TB12
20.0000 mg | ORAL_TABLET | Freq: Two times a day (BID) | ORAL | Status: DC
  Administered 2010-12-16: 20 mg via ORAL
  Filled 2010-12-16 (×2): qty 1

## 2010-12-16 MED ORDER — LISINOPRIL 20 MG PO TABS
20.0000 mg | ORAL_TABLET | Freq: Every day | ORAL | Status: DC
  Administered 2010-12-16 – 2010-12-20 (×3): 20 mg via ORAL
  Filled 2010-12-16 (×5): qty 1

## 2010-12-16 MED ORDER — ACETAMINOPHEN 10 MG/ML IV SOLN
1000.0000 mg | Freq: Four times a day (QID) | INTRAVENOUS | Status: DC
  Administered 2010-12-16: 1000 mg via INTRAVENOUS
  Filled 2010-12-16 (×4): qty 100

## 2010-12-16 MED ORDER — LACTATED RINGERS IV SOLN
INTRAVENOUS | Status: DC
  Administered 2010-12-16: 10:00:00 via INTRAVENOUS

## 2010-12-16 MED ORDER — SENNOSIDES-DOCUSATE SODIUM 8.6-50 MG PO TABS
1.0000 | ORAL_TABLET | Freq: Every evening | ORAL | Status: DC | PRN
  Administered 2010-12-18: 1 via ORAL
  Filled 2010-12-16: qty 1

## 2010-12-16 MED ORDER — ZOLPIDEM TARTRATE 5 MG PO TABS
5.0000 mg | ORAL_TABLET | Freq: Every evening | ORAL | Status: DC | PRN
  Administered 2010-12-18: 5 mg via ORAL
  Filled 2010-12-16: qty 1

## 2010-12-16 MED ORDER — BUPIVACAINE ON-Q PAIN PUMP (FOR ORDER SET NO CHG)
INJECTION | Status: DC
  Filled 2010-12-16: qty 1

## 2010-12-16 MED ORDER — DILTIAZEM HCL ER COATED BEADS 360 MG PO CP24
360.0000 mg | ORAL_CAPSULE | Freq: Every day | ORAL | Status: DC
  Administered 2010-12-16 – 2010-12-20 (×3): 360 mg via ORAL
  Filled 2010-12-16 (×5): qty 1

## 2010-12-16 MED ORDER — SODIUM CHLORIDE 0.9 % IV SOLN
INTRAVENOUS | Status: DC
  Administered 2010-12-16: 13:00:00 via INTRAVENOUS

## 2010-12-16 MED ORDER — FLEET ENEMA 7-19 GM/118ML RE ENEM: 1.0000 | ENEMA | Freq: Once | RECTAL | Status: AC | PRN

## 2010-12-16 MED ORDER — DOCUSATE SODIUM 100 MG PO CAPS
100.0000 mg | ORAL_CAPSULE | Freq: Two times a day (BID) | ORAL | Status: DC
  Administered 2010-12-16 – 2010-12-20 (×5): 100 mg via ORAL
  Filled 2010-12-16 (×9): qty 1

## 2010-12-16 MED ORDER — BISACODYL 5 MG PO TBEC: 5.0000 mg | DELAYED_RELEASE_TABLET | Freq: Every day | ORAL | Status: DC | PRN

## 2010-12-16 MED ORDER — TERAZOSIN HCL 5 MG PO CAPS
5.0000 mg | ORAL_CAPSULE | Freq: Every day | ORAL | Status: DC
  Administered 2010-12-16 – 2010-12-19 (×4): 5 mg via ORAL
  Filled 2010-12-16 (×5): qty 1

## 2010-12-16 MED ORDER — ONDANSETRON HCL 4 MG/2ML IJ SOLN
INTRAMUSCULAR | Status: DC | PRN
  Administered 2010-12-16: 4 mg via INTRAVENOUS

## 2010-12-16 MED ORDER — METOCLOPRAMIDE HCL 5 MG PO TABS
5.0000 mg | ORAL_TABLET | Freq: Three times a day (TID) | ORAL | Status: DC | PRN
  Filled 2010-12-16: qty 2

## 2010-12-16 MED ORDER — METHOCARBAMOL 100 MG/ML IJ SOLN
500.0000 mg | Freq: Four times a day (QID) | INTRAVENOUS | Status: DC | PRN
  Filled 2010-12-16: qty 5

## 2010-12-16 MED ORDER — PROPOFOL 10 MG/ML IV EMUL
INTRAVENOUS | Status: DC | PRN
  Administered 2010-12-16: 200 mg via INTRAVENOUS

## 2010-12-16 MED ORDER — NEOSTIGMINE METHYLSULFATE 1 MG/ML IJ SOLN
INTRAMUSCULAR | Status: DC | PRN
  Administered 2010-12-16: 4 mg via INTRAVENOUS

## 2010-12-16 MED ORDER — ACETAMINOPHEN 325 MG PO TABS: 650.0000 mg | ORAL_TABLET | Freq: Four times a day (QID) | ORAL | Status: DC | PRN

## 2010-12-16 MED ORDER — METHYLPREDNISOLONE 2 MG PO TABS: 2.5000 mg | ORAL_TABLET | ORAL | Status: DC

## 2010-12-16 MED ORDER — FAMOTIDINE 20 MG PO TABS
20.0000 mg | ORAL_TABLET | Freq: Two times a day (BID) | ORAL | Status: DC
  Administered 2010-12-16 – 2010-12-20 (×7): 20 mg via ORAL
  Filled 2010-12-16 (×9): qty 1

## 2010-12-16 MED ORDER — SODIUM CHLORIDE 0.9 % IR SOLN
Status: DC | PRN
  Administered 2010-12-16: 3000 mL
  Administered 2010-12-16: 1000 mL

## 2010-12-16 MED ORDER — CEFAZOLIN SODIUM-DEXTROSE 2-3 GM-% IV SOLR
2.0000 g | Freq: Four times a day (QID) | INTRAVENOUS | Status: AC
  Administered 2010-12-16 – 2010-12-17 (×3): 2 g via INTRAVENOUS
  Filled 2010-12-16 (×3): qty 50

## 2010-12-16 MED ORDER — HYDROMORPHONE HCL PF 1 MG/ML IJ SOLN
0.2500 mg | INTRAMUSCULAR | Status: DC | PRN
  Administered 2010-12-16: 0.25 mg via INTRAVENOUS
  Administered 2010-12-16 (×2): 0.5 mg via INTRAVENOUS

## 2010-12-16 MED ORDER — SIMVASTATIN 10 MG PO TABS
10.0000 mg | ORAL_TABLET | Freq: Every day | ORAL | Status: DC
  Administered 2010-12-16 – 2010-12-19 (×4): 10 mg via ORAL
  Filled 2010-12-16 (×5): qty 1

## 2010-12-16 MED ORDER — GLYCOPYRROLATE 0.2 MG/ML IJ SOLN
INTRAMUSCULAR | Status: DC | PRN
  Administered 2010-12-16: .5 mg via INTRAVENOUS

## 2010-12-16 MED ORDER — MENTHOL 3 MG MT LOZG: 1.0000 | LOZENGE | OROMUCOSAL | Status: DC | PRN

## 2010-12-16 MED ORDER — LACTATED RINGERS IV SOLN
INTRAVENOUS | Status: DC | PRN
  Administered 2010-12-16: 09:00:00 via INTRAVENOUS

## 2010-12-16 MED ORDER — SODIUM CHLORIDE 0.9 % IV SOLN: INTRAVENOUS | Status: DC

## 2010-12-16 MED ORDER — METFORMIN HCL 500 MG PO TABS
1000.0000 mg | ORAL_TABLET | Freq: Two times a day (BID) | ORAL | Status: DC
  Administered 2010-12-16 – 2010-12-18 (×4): 1000 mg via ORAL
  Filled 2010-12-16 (×6): qty 2

## 2010-12-16 MED ORDER — METHOCARBAMOL 100 MG/ML IJ SOLN
500.0000 mg | Freq: Once | INTRAMUSCULAR | Status: AC
  Administered 2010-12-16: 500 mg via INTRAVENOUS
  Filled 2010-12-16: qty 5

## 2010-12-16 MED ORDER — ACETAMINOPHEN 650 MG RE SUPP: 650.0000 mg | Freq: Four times a day (QID) | RECTAL | Status: DC | PRN

## 2010-12-16 MED ORDER — PHENOL 1.4 % MT LIQD
1.0000 | OROMUCOSAL | Status: DC | PRN
  Filled 2010-12-16: qty 177

## 2010-12-16 MED ORDER — EPHEDRINE SULFATE 50 MG/ML IJ SOLN
INTRAMUSCULAR | Status: DC | PRN
  Administered 2010-12-16: 10 mg via INTRAVENOUS

## 2010-12-16 MED ORDER — ENOXAPARIN SODIUM 30 MG/0.3ML ~~LOC~~ SOLN
30.0000 mg | Freq: Two times a day (BID) | SUBCUTANEOUS | Status: DC
  Administered 2010-12-17 – 2010-12-20 (×7): 30 mg via SUBCUTANEOUS
  Filled 2010-12-16 (×9): qty 0.3

## 2010-12-16 MED ORDER — BUPIVACAINE 0.25 % ON-Q PUMP SINGLE CATH 300ML
INJECTION | Status: DC | PRN
  Administered 2010-12-16: 300 mL

## 2010-12-16 MED ORDER — BUPIVACAINE-EPINEPHRINE PF 0.5-1:200000 % IJ SOLN
INTRAMUSCULAR | Status: DC | PRN
  Administered 2010-12-16: 20 mL

## 2010-12-16 MED ORDER — ALUM & MAG HYDROXIDE-SIMETH 200-200-20 MG/5ML PO SUSP: 30.0000 mL | ORAL | Status: DC | PRN

## 2010-12-16 SURGICAL SUPPLY — 62 items
BANDAGE ELASTIC 6 VELCRO ST LF (GAUZE/BANDAGES/DRESSINGS) ×1 IMPLANT
BANDAGE ESMARK 6X9 LF (GAUZE/BANDAGES/DRESSINGS) ×1 IMPLANT
BLADE SAGITTAL 13X1.27X60 (BLADE) ×2 IMPLANT
BLADE SAW SGTL 83.5X18.5 (BLADE) ×2 IMPLANT
BNDG CMPR 9X6 STRL LF SNTH (GAUZE/BANDAGES/DRESSINGS) ×1
BNDG ESMARK 6X9 LF (GAUZE/BANDAGES/DRESSINGS) ×2
BOWL SMART MIX CTS (DISPOSABLE) ×2 IMPLANT
CATH KIT ON Q 10IN SLV (PAIN MANAGEMENT) ×2 IMPLANT
CEMENT BONE SIMPLEX SPEEDSET (Cement) ×4 IMPLANT
CLOTH BEACON ORANGE TIMEOUT ST (SAFETY) ×2 IMPLANT
COVER BACK TABLE 24X17X13 BIG (DRAPES) ×1 IMPLANT
COVER SURGICAL LIGHT HANDLE (MISCELLANEOUS) ×2 IMPLANT
CUFF TOURNIQUET SINGLE 34IN LL (TOURNIQUET CUFF) ×2 IMPLANT
DRAPE EXTREMITY T 121X128X90 (DRAPE) ×2 IMPLANT
DRAPE INCISE IOBAN 66X45 STRL (DRAPES) ×4 IMPLANT
DRAPE PROXIMA HALF (DRAPES) ×2 IMPLANT
DRAPE U-SHAPE 47X51 STRL (DRAPES) ×2 IMPLANT
DRSG ADAPTIC 3X8 NADH LF (GAUZE/BANDAGES/DRESSINGS) ×2 IMPLANT
DRSG PAD ABDOMINAL 8X10 ST (GAUZE/BANDAGES/DRESSINGS) ×2 IMPLANT
DURAPREP 26ML APPLICATOR (WOUND CARE) ×4 IMPLANT
ELECT REM PT RETURN 9FT ADLT (ELECTROSURGICAL) ×2
ELECTRODE REM PT RTRN 9FT ADLT (ELECTROSURGICAL) ×1 IMPLANT
EVACUATOR 1/8 PVC DRAIN (DRAIN) ×2 IMPLANT
GAUZE SPONGE 4X4 12PLY STRL LF (GAUZE/BANDAGES/DRESSINGS) ×1 IMPLANT
GLOVE BIOGEL M 7.0 STRL (GLOVE) ×1 IMPLANT
GLOVE BIOGEL PI IND STRL 7.5 (GLOVE) IMPLANT
GLOVE BIOGEL PI IND STRL 8.5 (GLOVE) ×2 IMPLANT
GLOVE BIOGEL PI INDICATOR 7.5 (GLOVE) ×2
GLOVE BIOGEL PI INDICATOR 8.5 (GLOVE) ×1
GLOVE ECLIPSE 7.0 STRL STRAW (GLOVE) ×1 IMPLANT
GLOVE SURG ORTHO 8.0 STRL STRW (GLOVE) ×3 IMPLANT
GLOVE SURG SS PI 7.5 STRL IVOR (GLOVE) ×3 IMPLANT
GOWN PREVENTION PLUS XLARGE (GOWN DISPOSABLE) ×5 IMPLANT
GOWN STRL NON-REIN LRG LVL3 (GOWN DISPOSABLE) ×3 IMPLANT
HANDPIECE INTERPULSE COAX TIP (DISPOSABLE) ×2
HOOD PEEL AWAY FACE SHEILD DIS (HOOD) ×8 IMPLANT
IV NS IRRIG 3000ML ARTHROMATIC (IV SOLUTION) ×1 IMPLANT
KIT BASIN OR (CUSTOM PROCEDURE TRAY) ×2 IMPLANT
KIT ROOM TURNOVER OR (KITS) ×2 IMPLANT
MANIFOLD NEPTUNE II (INSTRUMENTS) ×2 IMPLANT
NEEDLE 22X1 1/2 (OR ONLY) (NEEDLE) IMPLANT
NS IRRIG 1000ML POUR BTL (IV SOLUTION) ×2 IMPLANT
PACK TOTAL JOINT (CUSTOM PROCEDURE TRAY) ×2 IMPLANT
PAD ARMBOARD 7.5X6 YLW CONV (MISCELLANEOUS) ×4 IMPLANT
PADDING CAST COTTON 6X4 STRL (CAST SUPPLIES) ×2 IMPLANT
PADDING WEBRIL 6 STERILE (GAUZE/BANDAGES/DRESSINGS) ×1 IMPLANT
POSITIONER HEAD PRONE TRACH (MISCELLANEOUS) ×2 IMPLANT
SET HNDPC FAN SPRY TIP SCT (DISPOSABLE) ×1 IMPLANT
SPONGE GAUZE 4X4 12PLY (GAUZE/BANDAGES/DRESSINGS) ×2 IMPLANT
STAPLER VISISTAT 35W (STAPLE) ×2 IMPLANT
SUCTION FRAZIER TIP 10 FR DISP (SUCTIONS) ×2 IMPLANT
SUT BONE WAX W31G (SUTURE) ×2 IMPLANT
SUT VIC AB 0 CTB1 27 (SUTURE) ×4 IMPLANT
SUT VIC AB 1 CT1 27 (SUTURE) ×8
SUT VIC AB 1 CT1 27XBRD ANBCTR (SUTURE) ×4 IMPLANT
SUT VIC AB 2-0 CT1 27 (SUTURE) ×2
SUT VIC AB 2-0 CT1 TAPERPNT 27 (SUTURE) ×2 IMPLANT
SYR CONTROL 10ML LL (SYRINGE) IMPLANT
TOWEL OR 17X24 6PK STRL BLUE (TOWEL DISPOSABLE) ×2 IMPLANT
TOWEL OR 17X26 10 PK STRL BLUE (TOWEL DISPOSABLE) ×2 IMPLANT
TRAY FOLEY CATH 14FR (SET/KITS/TRAYS/PACK) ×1 IMPLANT
WATER STERILE IRR 1000ML POUR (IV SOLUTION) ×6 IMPLANT

## 2010-12-16 NOTE — Anesthesia Preprocedure Evaluation (Addendum)
Anesthesia Evaluation  Patient identified by MRN, date of birth, ID band  Reviewed: Allergy & Precautions, H&P , NPO status , Patient's Chart, lab work & pertinent test results  Airway Mallampati: I TM Distance: >3 FB Neck ROM: full    Dental  (+) Edentulous Upper and Edentulous Lower   Pulmonary    Pulmonary exam normal       Cardiovascular hypertension, regular Normal    Neuro/Psych Negative Neurological ROS     GI/Hepatic Neg liver ROS, GERD-  ,  Endo/Other  Type 2  Renal/GU negative Renal ROS  Genitourinary negative   Musculoskeletal   Abdominal   Peds  Hematology   Anesthesia Other Findings   Reproductive/Obstetrics                           Anesthesia Physical Anesthesia Plan  ASA: II  Anesthesia Plan: General   Post-op Pain Management:    Induction: Intravenous  Airway Management Planned: Oral ETT  Additional Equipment:   Intra-op Plan:   Post-operative Plan: Extubation in OR  Informed Consent:   Plan Discussed with: CRNA, Anesthesiologist and Surgeon  Anesthesia Plan Comments:         Anesthesia Quick Evaluation

## 2010-12-16 NOTE — Anesthesia Procedure Notes (Addendum)
Performed by: Iona Hansen   Anesthesia Regional Block:  Femoral nerve block  Pre-Anesthetic Checklist: ,, timeout performed, Correct Patient, Correct Site, Correct Laterality, Correct Procedure, Correct Position, site marked, Risks and benefits discussed,  Surgical consent,  Pre-op evaluation,  At surgeon's request and post-op pain management  Laterality: Right  Prep: chloraprep       Needles:  Injection technique: Single-shot  Needle Type: Stimulator Needle - 80        Needle insertion depth: 6 cm   Additional Needles:  Procedures: nerve stimulator Femoral nerve block Narrative:  Start time: 12/16/2010 10:01 AM End time: 12/16/2010 10:08 AM Injection made incrementally with aspirations every 5 mL.  Performed by: Personally   Additional Notes: 20 cc 0.5% Marcaine w/o difficulty or discomfort    GES

## 2010-12-16 NOTE — Plan of Care (Signed)
Problem: Consults Goal: Diagnosis- Total Joint Replacement Primary Total Knee     

## 2010-12-16 NOTE — H&P (Signed)
  [name] MRN:  [id number] DOB/SEX:  06/16/1930/male  CHIEF COMPLAINT:  Painful right Knee  HISTORY: Patient is a 77 y.o. male presented with a history of pain in the right knee. Onset of symptoms was gradual starting several years ago with gradually worsening course since that time. The patient noted no past surgery on the right knee. Patient has been treated conservatively with over-the-counter NSAIDs and activity modification. Patient currently rates pain in the knee at 8 out of 10 with activity. There is pain at night.  PAST MEDICAL HISTORY: There are no active problems to display for this patient.  Past Medical History  Diagnosis Date  . Hypertension   . Pneumonia   . Diabetes mellitus   . GERD (gastroesophageal reflux disease)   . Arthritis     rheumatoid  . Cataracts, bilateral    Past Surgical History  Procedure Date  . Shoulder surgery     left, done in March 2012  . Rotator cuff repair     right side  . Foot surgery     left foot surgery at 77 year old then again to left foot 1984  . Eye surgery     bilateral cataract surgery lens inplant  . Tonsillectomy      MEDICATIONS:   No prescriptions prior to admission    ALLERGIES:  No Known Allergies  REVIEW OF SYSTEMS:  Pertinent items are noted in HPI.   FAMILY HISTORY:  No family history on file.  SOCIAL HISTORY:   History  Substance Use Topics  . Smoking status: Former Smoker -- 1.0 packs/day for 15 years    Types: Cigarettes  . Smokeless tobacco: Not on file   Comment: quit smoking in 1967  . Alcohol Use:      ocassional wine, beer or liquor     EXAMINATION:  Vital signs in last 24 hours:    There were no vitals taken for this visit. General appearance: alert, cooperative and no distress Head: Normocephalic, without obvious abnormality, atraumatic Neck: supple, symmetrical, trachea midline and thyroid not enlarged, symmetric, no tenderness/mass/nodules Lungs: clear to auscultation  bilaterally Heart: regular rate and rhythm, S1, S2 normal, no murmur, click, rub or gallop Abdomen: soft, non-tender; bowel sounds normal; no masses,  no organomegaly Extremities: extremities normal, atraumatic, no cyanosis or edema and Homans sign is negative, no sign of DVT Pulses: 2+ and symmetric Skin: Skin color, texture, turgor normal. No rashes or lesions Neurologic: Alert and oriented X 3, normal strength and tone. Normal symmetric reflexes. Normal coordination and gait  Musculoskeletal:  ROM 0-120, Ligaments intact,  Imaging Review Plain radiographs demonstrate severe degenerative joint disease of the right knee. The overall alignment is mild varus. The bone quality appears to be good for age and reported activity level.  Assessment/Plan: End stage arthritis, right knee   The patient history, physical examination and imaging studies are consistent with advanced degenerative joint disease of the right knee. The patient has failed conservative treatment.  The clearance notes were reviewed.  After discussion with the patient it was felt that Total Knee Replacement was indicated. The procedure,  risks, and benefits of total knee arthroplasty were presented and reviewed. The risks including but not limited to aseptic loosening, infection, blood clots, vascular injury, stiffness, patella tracking problems complications among others were discussed. The patient acknowledged the explanation, agreed to proceed with the plan.  Touchette,Crystalle Popwell 12/16/2010, 7:04 AM

## 2010-12-16 NOTE — Interval H&P Note (Signed)
History and Physical Interval Note:  12/16/2010 9:52 AM  [name]  has presented today for surgery, with the diagnosis of OA Right Knee   The various methods of treatment have been discussed with the patient and family. After consideration of risks, benefits and other options for treatment, the patient has consented to  Procedure(s): TOTAL KNEE ARTHROPLASTY as a surgical intervention .  The patients' history has been reviewed, patient examined, no change in status, stable for surgery.  I have reviewed the patients' chart and labs.  Questions were answered to the patient's satisfaction.     Keylen Eckenrode,STEPHEN D

## 2010-12-16 NOTE — Op Note (Signed)
TOTAL KNEE REPLACEMENT OPERATIVE NOTE:  12/16/2010  2:11 PM  PATIENT:  [name]  77 y.o. male  PRE-OPERATIVE DIAGNOSIS:  osteoarthritis Right Knee   POST-OPERATIVE DIAGNOSIS:  osteoarthritis Right Knee   PROCEDURE:  Procedure(s): TOTAL KNEE ARTHROPLASTY  SURGEON:  Surgeon(s): Raymon Mutton, MD  PHYSICIAN ASSISTANT: Altamese Cabal, [facility]  ANESTHESIA:   general  DRAINS: Hemovac and On-Q Marcaine Pain Pump  SPECIMEN: None  COUNTS:  Correct  TOURNIQUET:   Total Tourniquet Time Documented: Thigh (Right) - 44 minutes  DICTATION:  Indication for procedure:    The patient is a 77 y.o. male who has failed conservative treatment for osteoarthritis Right Knee .  Informed consent was obtained prior to anesthesia. The risks versus benefits of the operation were explain and in a way the patient can, and did, understand.   Description of procedure:     The patient was taken to the operating room and placed under anesthesia.  The patient was positioned in the usual fashion taking care that all body parts were adequately padded and/or protected.  I foley catheter was not placed.  A tourniquet was applied and the leg prepped and draped in the usual sterile fashion.  The extremity was exsanguinated with the esmarch and tourniquet inflated to 350 mmHg.  Pre-operative range of motion was 0-120 degrees flexion.  The knee was in 5 degree of mild varus.  A midline incision approximately 6-7 inches long was made with a #10 blade.  A new blade was used to make a parapatellar arthrotomy going 2-3 cm into the quadriceps tendon, over the patella, and alongside the medial aspect of the patellar tendon.  A synovectomy was then performed with the #10 blade and forceps. I then elevated the deep MCL off the medial tibial metaphysis subperiosteally around to the semimembranosus attachment.    I everted the patella and used calipers to measure patellar thickness, which was ###.  I used the reamer to ream  down to appropriate thickness to recreated the native thickness.  I then removed excess bone with the rongeur and sagittal saw.  I used the ## mm template and drilled the three lug holes.  I then put the trial in place and measured the thickness with the calipers to ensure recreation of the native thickness.  The trial was then removed and the patella subluxed and the knee brought into flexion.  A homan retractor was place to retract and protect the patella and lateral structures.  A Z-retractor was place medially to protect the medial structures.  The extra-medullary alignment system was used to make cut the tibial articular surface perpendicular to the anamotic axis of the tibia and in 3 degrees of posterior slope.  The cut surface and alignment jig was removed.  I then used the intramedullary alignment guide to make a 5 valgus cut on the distal femur.  I then marked out the epicondylar axis on the distal femur.  The posterior condylar axis measured 5 degrees.  I then used the anterior referencing sizer and measured the femur to be a size E.  The 4-In-1 cutting block was screwed into place in external rotation matching the posterior condylar angle, making our cuts perpendicular to the epicondylar axis.  Anterior, posterior and chamfer cuts were made with the sagittal saw.  The cutting block and cut pieces were removed.  A lamina spreader was placed in 90 degrees of flexion.  The ACL, PCL, menisci, and posterior condylar osteophytes were removed.  A 12  mm spacer blocked was found to offer good flexion and extension gap balance after minimal in degree releasing.   The scoop retractor was then placed and the femoral finishing block was pinned in place.  The small sagittal saw was used as well as the lug drill to finish the femur.  The block and cut surfaces were removed and the medullary canal hole filled with autograft bone from the cut pieces.  The tibia was delivered forward in deep flexion and external  rotation.  A size 5 tray was selected and pinned into place centered on the medial 1/3 of the tibial tubercle.  The reamer and keel was used to prepare the tibia through the tray.    I then trialed with the size E femur, size 5 tibia, a 12 mm insert and the 35 patella.  I had excellent flexion/extension gap balance, excellent patella tracking.  Flexion was full and beyond 120 degrees; extension was zero.  These components were chosen and the staff opened them to me on the back table while the knee was lavaged copiously and the cement mixed.  I cemented in the components and removed all excess cement.  The polyethylene tibial component was snapped into place and the knee placed in extension while cement was hardening.  The capsule was infilltrated with 20cc of .25% Marcaine with epinephrine.  A hemovac was place in the joint exiting superolaterally.  A pain pump was place superomedially superficial to the arthrotomy.  Once the cement was hard, the tourniquet was let down.  Hemostasis was obtained.  The arthrotomy was closed with figure-8 #1 vicryl sutures.  The deep soft tissues were closed with #0 vicryls and the subcuticular layer closed with a running #2-0 vicryl.  The skin was reapproximated and closed with skin staples.  The wound was dressed with xeroform, 4 x4's, 2 ABD sponges, a single layer of webril and a TED stocking.   The patient was then awakened, extubated, and taken to the recovery room in stable condition.  BLOOD LOSS:  300cc DRAINS: 1 hemovac, 1 pain catheter COMPLICATIONS:  None.  PLAN OF CARE: Admit to inpatient   PATIENT DISPOSITION:  PACU - hemodynamically stable.   Delay start of Pharmacological VTE agent (>24hrs) due to surgical blood loss or risk of bleeding:  not applicable

## 2010-12-16 NOTE — Anesthesia Postprocedure Evaluation (Signed)
  Anesthesia Post-op Note  Patient: [name]  Procedure(s) Performed:  TOTAL KNEE ARTHROPLASTY  Patient Location: PACU  Anesthesia Type: GA combined with regional for post-op pain  Level of Consciousness: awake, oriented, sedated and patient cooperative  Airway and Oxygen Therapy: Patient Spontanous Breathing and Patient connected to nasal cannula oxygen  Post-op Pain: mild  Post-op Assessment: Post-op Vital signs reviewed, Patient's Cardiovascular Status Stable, Respiratory Function Stable, Patent Airway, No signs of Nausea or vomiting and Pain level controlled  Post-op Vital Signs: stable  Complications: No apparent anesthesia complications

## 2010-12-16 NOTE — H&P (View-Only) (Signed)
[name] MRN:  [id number] DOB/SEX:  08/05/1930/male  CHIEF COMPLAINT:  Painful right Knee  HISTORY: Patient is a 77 y.o. male presented with a history of pain in the right knee. Onset of symptoms was gradual starting several years ago with gradually worsening course since that time. The patient noted no past surgery on the right knee. Patient has been treated conservatively with over-the-counter NSAIDs and activity modification. Patient currently rates pain in the knee at 8 out of 10 with activity. There is pain at night.  PAST MEDICAL HISTORY: There are no active problems to display for this patient.  No past medical history on file. No past surgical history on file.   MEDICATIONS:   (Not in a hospital admission)  ALLERGIES:  Allergies not on file  REVIEW OF SYSTEMS:  Pertinent items are noted in HPI.   FAMILY HISTORY:  No family history on file.  SOCIAL HISTORY:   History  Substance Use Topics  . Smoking status: Not on file  . Smokeless tobacco: Not on file  . Alcohol Use: Not on file     EXAMINATION:  Vital signs in last 24 hours: @VSRANGES@  There were no vitals taken for this visit.  General Appearance:    Alert, cooperative, no distress, appears stated age  Head:    Normocephalic, without obvious abnormality, atraumatic  Eyes:    PERRLA   Ears:    Nose:   Nares normal, septum midline, mucosa normal, no drainage    or sinus tenderness  Throat:   Lips, mucosa, and tongue normal; teeth and gums normal  Neck:   Supple, symmetrical, trachea midline, no adenopathy;       thyroid:  No enlargement/tenderness/nodules; no carotid   bruit or JVD  Back:     Symmetric, no curvature, ROM normal, no CVA tenderness  Lungs:     Clear to auscultation bilaterally, respirations unlabored  Chest wall:    No tenderness or deformity  Heart:    Regular rate and rhythm, S1 and S2 normal, no murmur, rub   or gallop  Abdomen:     Soft, non-tender, bowel sounds active all four  quadrants,    no masses, no organomegaly  Genitalia:    Rectal:    Extremities:   Extremities normal, atraumatic, no cyanosis   Pulses:   2+ and symmetric all extremities  Skin:   Skin color, texture, turgor normal, no rashes or lesions  Lymph nodes:   Cervical, supraclavicular, and axillary nodes normal  Neurologic:   CNII-XII intact. Normal strength, sensation and reflexes      throughout    Musculoskeletal:  ROM 0-105, Ligaments intact, positive crepitus Imaging Review Plain radiographs demonstrate severe degenerative joint disease of the right knee. The overall alignment is mild varus. The bone quality appears to be fair for age and reported activity level.  Assessment/Plan: End stage arthritis, right knee   The patient history, physical examination and imaging studies are consistent with advanced degenerative joint disease of the right knee. The patient has failed conservative treatment.  The clearance notes were reviewed.  After discussion with the patient it was felt that Total Knee Replacement was indicated. The procedure,  risks, and benefits of total knee arthroplasty were presented and reviewed. The risks including but not limited to aseptic loosening, infection, blood clots, vascular injury, stiffness, patella tracking problems complications among others were discussed. The patient acknowledged the explanation, agreed to proceed with the plan.  Tieu,Damoni Causby 12/02/2010, 11:37 AM    

## 2010-12-16 NOTE — Transfer of Care (Signed)
Immediate Anesthesia Transfer of Care Note  Patient: [name]  Procedure(s) Performed:  TOTAL KNEE ARTHROPLASTY  Patient Location: PACU  Anesthesia Type: General  Level of Consciousness: awake, alert , oriented and sedated  Airway & Oxygen Therapy: Patient Spontanous Breathing and Patient connected to nasal cannula oxygen  Post-op Assessment: Report given to PACU RN  Post vital signs: Reviewed and stable  Complications: No apparent anesthesia complications

## 2010-12-16 NOTE — Preoperative (Signed)
Beta Blockers   Reason not to administer Beta Blockers:Not Applicable 

## 2010-12-17 LAB — GLUCOSE, CAPILLARY
Glucose-Capillary: 153 mg/dL — ABNORMAL HIGH (ref 70–99)
Glucose-Capillary: 162 mg/dL — ABNORMAL HIGH (ref 70–99)
Glucose-Capillary: 131 mg/dL — ABNORMAL HIGH (ref 70–99)

## 2010-12-17 LAB — BASIC METABOLIC PANEL
CO2: 25 mEq/L (ref 19–32)
Chloride: 101 mEq/L (ref 96–112)
Creatinine, Ser: 1.33 mg/dL (ref 0.50–1.35)
GFR calc Af Amer: 57 mL/min — ABNORMAL LOW (ref >90)
Potassium: 3.7 mEq/L (ref 3.5–5.1)
Sodium: 136 mEq/L (ref 135–145)

## 2010-12-17 LAB — CBC
Platelets: 157 10*3/uL (ref 150–400)
RBC: 3.12 MIL/uL — ABNORMAL LOW (ref 4.22–5.81)
RDW: 14.4 % (ref 11.5–15.5)
WBC: 6.5 10*3/uL (ref 4.0–10.5)

## 2010-12-17 MED ORDER — HYDROCODONE-ACETAMINOPHEN 5-325 MG PO TABS
1.0000 | ORAL_TABLET | ORAL | Status: DC | PRN
  Administered 2010-12-18 (×4): 1 via ORAL
  Administered 2010-12-19 – 2010-12-20 (×5): 2 via ORAL
  Filled 2010-12-17 (×2): qty 2
  Filled 2010-12-17: qty 1
  Filled 2010-12-17 (×4): qty 2
  Filled 2010-12-17: qty 1
  Filled 2010-12-17: qty 2
  Filled 2010-12-17: qty 1

## 2010-12-17 NOTE — Progress Notes (Signed)
Physical Therapy Evaluation Patient Details Name: [name] MRN: [id number] DOB: August 17, 1930 Today's Date: 12/17/2010  Problem List: There is no problem list on file for this patient.   Past Medical History:  Past Medical History  Diagnosis Date  . Hypertension   . Pneumonia   . Diabetes mellitus   . GERD (gastroesophageal reflux disease)   . Arthritis     rheumatoid  . Cataracts, bilateral    Past Surgical History:  Past Surgical History  Procedure Date  . Shoulder surgery     left, done in March 2012  . Rotator cuff repair     right side  . Foot surgery     left foot surgery at 77 year old then again to left foot 1984  . Eye surgery     bilateral cataract surgery lens inplant  . Tonsillectomy     PT Assessment/Plan/Recommendation PT Assessment Clinical Impression Statement: Pt presents with a medical diagnosis of Right TKA along with the following impairements/deficits listed below. Pt will benefit from skilled PT in the acute care setting in order to maximize functional mobility for safety upon d/c PT Recommendation/Assessment: Patient will need skilled PT in the acute care venue PT Problem List: Decreased strength;Decreased range of motion;Decreased activity tolerance;Decreased balance;Decreased mobility;Decreased knowledge of use of DME;Decreased knowledge of precautions;Pain PT Plan PT Frequency: 7X/week PT Treatment/Interventions: DME instruction;Gait training;Stair training;Functional mobility training;Therapeutic activities;Therapeutic exercise;Balance training;Patient/family education PT Recommendation Follow Up Recommendations: Home health PT Equipment Recommended: None recommended by PT PT Goals  Acute Rehab PT Goals PT Goal Formulation: With patient Time For Goal Achievement: 7 days Pt will go Supine/Side to Sit: with modified independence PT Goal: Supine/Side to Sit - Progress: Progressing toward goal Pt will go Sit to Supine/Side: with modified  independence PT Goal: Sit to Supine/Side - Progress: Progressing toward goal Pt will go Sit to Stand: with supervision PT Goal: Sit to Stand - Progress: Progressing toward goal Pt will go Stand to Sit: with supervision PT Goal: Stand to Sit - Progress: Progressing toward goal Pt will Transfer Bed to Chair/Chair to Bed: with supervision PT Transfer Goal: Bed to Chair/Chair to Bed - Progress: Progressing toward goal Pt will Ambulate: >150 feet;with supervision;with rolling walker PT Goal: Ambulate - Progress: Progressing toward goal Pt will Go Up / Down Stairs: 3-5 stairs;with rolling walker;with min assist PT Goal: Up/Down Stairs - Progress: Other (comment) (Unassessed today)  PT Evaluation Precautions/Restrictions  Restrictions Weight Bearing Restrictions: No RLE Weight Bearing: Weight bearing as tolerated Prior Functioning  Home Living Lives With: Spouse Receives Help From: Family Type of Home: House Home Layout: One level Home Access: Ramped entrance Bathroom Shower/Tub: Walk-in shower;Door [employer] Toilet: Handicapped height Bathroom Accessibility: Yes How Accessible: Accessible via walker Home Adaptive Equipment: Walker - rolling;Straight cane Prior Function Level of Independence: Independent with basic ADLs;Independent with gait;Independent with transfers;Independent with homemaking with ambulation (uses walker at night) Able to Take Stairs?: Yes Driving: Yes Vocation: Retired Financial risk analyst Arousal/Alertness: Awake/alert Overall Cognitive Status: Appears within functional limits for tasks assessed Orientation Level: Oriented X4 Sensation/Coordination Sensation Light Touch: Appears Intact Extremity Assessment RLE Assessment RLE Assessment: Exceptions to [facility] RLE AROM (degrees) Overall AROM Right Lower Extremity: Deficits;Due to pain (Hip and Ankle WFL) RLE Strength RLE Overall Strength: Deficits;Due to pain (Hip and Ankle WFL) LLE Assessment LLE Assessment:  Within Functional Limits (Previous ankle injuries) Mobility (including Balance) Bed Mobility Bed Mobility: Yes Supine to Sit: 3: Mod assist Supine to Sit Details (indicate cue type and  reason): VC for sequencing and hand placement. Assist with trunk control and RLE Sitting - Scoot to Edge of Bed: 4: Min assist Sitting - Scoot to [employer] of Bed Details (indicate cue type and reason): VC for sequencing and hand placement Transfers Transfers: Yes Sit to Stand: 3: Mod assist Sit to Stand Details (indicate cue type and reason): Assist for balance into standing. VC for hand placement and sequencing Stand to Sit: 4: Min assist Stand to Sit Details: VC for hand placement and leg placement into sitting. Pt with uncontrolled descent Ambulation/Gait Ambulation/Gait: Yes Ambulation/Gait Assistance: 4: Min assist Ambulation/Gait Assistance Details (indicate cue type and reason): VC for proper sequencing and postural cues. Slight right knee buckling with increased fatigue. Ambulation Distance (Feet): 15 Feet Assistive device: Rolling walker Gait Pattern: Step-to pattern;Decreased step length - left;Decreased stance time - right;Decreased hip/knee flexion - right;Trunk flexed Gait velocity: Decreased gait speed Stairs: No Wheelchair Mobility Wheelchair Mobility: No    Exercise  Total Joint Exercises Ankle Circles/Pumps: AROM;Strengthening;Both;10 reps;Supine Quad Sets: Right;Strengthening;AAROM;10 reps;Supine End of Session PT - End of Session Equipment Utilized During Treatment: Gait belt Activity Tolerance: Patient tolerated treatment well Patient left: in chair;with call bell in reach;with family/visitor present Nurse Communication: Mobility status for transfers;Mobility status for ambulation General Behavior During Session: [facility] for tasks performed Cognition: [facility] for tasks performed  Milana Kidney 12/17/2010, 10:05 AM  12/17/2010 Milana Kidney DPT PAGER: [phone] OFFICE:  [phone]

## 2010-12-17 NOTE — Progress Notes (Signed)
PATIENT ID:      [name]  MRN:     [id number] DOB/AGE:    [birth year] / 77 y.o.    PROGRESS NOTE Subjective:  negative for Chest Pain  negative for Shortness of Breath  negative for Nausea/Vomiting   negative for Calf Pain  negative for Bowel Movement   Tolerating Diet: yes         Patient reports pain as 3 on 0-10 scale.    Objective: Vital signs in last 24 hours:  Patient Vitals for the past 24 hrs:  BP Temp Pulse Resp SpO2  12/17/10 0649 95/56 mmHg 97.3 F (36.3 C) 111  19  92 %  12/17/10 0231 96/61 mmHg 97.8 F (36.6 C) 102  19  98 %  12/16/10 2243 122/71 mmHg 97.1 F (36.2 C) 93  19  96 %  12/16/10 1953 133/77 mmHg 97 F (36.1 C) 102  20  96 %  12/16/10 1514 106/63 mmHg 96.9 F (36.1 C) 91  16  96 %  12/16/10 1500 - - 84  11  99 %  12/16/10 1448 127/58 mmHg - - - -  12/16/10 1445 - - 85  15  99 %  12/16/10 1430 - - 87  14  99 %  12/16/10 1415 - - 90  13  99 %  12/16/10 1403 127/64 mmHg - - - -  12/16/10 1400 - - 92  25  98 %  12/16/10 1349 121/54 mmHg - - - -  12/16/10 1345 - - 89  15  99 %  12/16/10 1333 134/54 mmHg - - - -  12/16/10 1330 - - 88  12  98 %  12/16/10 1318 132/56 mmHg - - - -  12/16/10 1315 - - 85  13  96 %  12/16/10 1309 - - 87  12  98 %  12/16/10 1303 138/61 mmHg - - - -  12/16/10 1300 - - 86  10  98 %  12/16/10 1248 146/59 mmHg - - - -  12/16/10 1245 - - 90  12  98 %  12/16/10 1233 149/55 mmHg - - - -  12/16/10 1230 - - 92  14  96 %  12/16/10 1218 144/57 mmHg - - - -  12/16/10 1217 - 98.6 F (37 C) 101  15  92 %  12/16/10 1009 - - 88  - 97 %  12/16/10 1005 - - 86  - 99 %  12/16/10 1004 - - 84  - 98 %  12/16/10 1003 - - 86  - 97 %  12/16/10 1000 - - 86  - 96 %  12/16/10 0858 101/65 mmHg 98.2 F (36.8 C) 94  18  97 %      Intake/Output from previous day:   12/03 0701 - 12/04 0700 In: 1900 [I.V.:1900] Out: 400 [Drains:350]   Intake/Output this shift:       Intake/Output      12/03 0701 - 12/04 0700 12/04 0701 - 12/05 0700   I.V. 1900    Total Intake 1900    Drains 350    Blood 50    Total Output 400    Net +1500            LABORATORY DATA:  Basename 12/17/10 0630 12/11/10 1046  WBC 6.5 7.0  HGB 9.6* 12.7*  HCT 27.5* 36.2*  PLT 157 154    Basename 12/11/10 1046  NA 139  K 3.8  CL 102  CO2 25  BUN 26*  CREATININE 1.32  GLUCOSE 101*  CALCIUM 8.7   Lab Results  Component Value Date   INR 0.93 12/11/2010   INR 0.97 03/19/2010    Examination:  General appearance: alert, cooperative and no distress Extremities: extremities normal, atraumatic, no cyanosis or edema and Homans sign is negative, no sign of DVT  Wound Exam: clean, dry, intact   Drainage:  None: wound tissue dry  Motor Exam EHL and FHL Intact  Sensory Exam Deep Peroneal normal  Assessment:    1 Day Post-Op  Procedure(s) (LRB): TOTAL KNEE ARTHROPLASTY (Right)  ADDITIONAL DIAGNOSIS:  Active Problems:  * No active hospital problems. *   Acute Blood Loss Anemia   Plan: Physical Therapy as ordered Weight Bearing as Tolerated (WBAT)  DVT Prophylaxis:  Lovenox  DISCHARGE PLAN: Home  DISCHARGE NEEDS: HHPT, CPM, Walker and 3-in-1 comode seat         Bisig,Taylia Berber 12/17/2010, 8:10 AM

## 2010-12-17 NOTE — Progress Notes (Signed)
Physical Therapy Treatment Patient Details Name: [name] MRN: [id number] DOB: 08-14-1930 Today's Date: 12/17/2010  PT Assessment/Plan  PT - Assessment/Plan Comments on Treatment Session: Pt stated feelings of nausea and vomitting throughout the day, therefore he only wanted to get back to bed. Once sitting on edge of bed, pt got sick. RN notified and aware. Pending progress, attempt increased ambulation distance and stairs tomorrow. PT Plan: Discharge plan remains appropriate PT Frequency: 7X/week Follow Up Recommendations: Home health PT Equipment Recommended: None recommended by PT PT Goals  Acute Rehab PT Goals PT Goal Formulation: With patient Time For Goal Achievement: 7 days PT Goal: Supine/Side to Sit - Progress: Progressing toward goal PT Goal: Sit to Supine/Side - Progress: Progressing toward goal PT Goal: Sit to Stand - Progress: Progressing toward goal PT Goal: Stand to Sit - Progress: Progressing toward goal PT Transfer Goal: Bed to Chair/Chair to Bed - Progress: Progressing toward goal PT Goal: Ambulate - Progress: Progressing toward goal PT Goal: Up/Down Stairs - Progress: Other (comment) (Unassessed today)  PT Treatment Precautions/Restrictions  Restrictions Weight Bearing Restrictions: Yes RLE Weight Bearing: Weight bearing as tolerated Mobility (including Balance) Bed Mobility Bed Mobility: Yes Sit to Supine - Right: 4: Min assist;HOB flat Sit to Supine - Right Details (indicate cue type and reason): Assist with RLE into bed. VC for sequencing Scooting to [facility]: With trapeze;4: Min assist Scooting to [facility] Details (indicate cue type and reason): VC for sequencing. pt used trapeze and left lower extremity to get to head of bed Transfers Transfers: Yes Sit to Stand: 3: Mod assist;From chair/3-in-1;With upper extremity assist Sit to Stand Details (indicate cue type and reason): VC for hand placement. Assist to mantain stability in to standing Stand to Sit: 4: Min  assist Stand to Sit Details: VC for sequencing and hand placement Stand Pivot Transfers: 3: Mod assist Stand Pivot Transfer Details (indicate cue type and reason): Mod assist for stability throughout transfer from chair to bed Ambulation/Gait Ambulation/Gait: No (secondary to nausea)    Exercise    End of Session PT - End of Session Equipment Utilized During Treatment: Gait belt Activity Tolerance: Treatment limited secondary to medical complications (Comment) (nausea and vomitting throughout session) Patient left: in bed;in CPM;with family/visitor present;with call bell in reach Nurse Communication: Mobility status for transfers;Mobility status for ambulation General Behavior During Session: [facility] for tasks performed Cognition: [facility] for tasks performed  Milana Kidney 12/17/2010, 3:25 PM  12/17/2010 Milana Kidney DPT PAGER: [phone] OFFICE: [phone]

## 2010-12-17 NOTE — Plan of Care (Signed)
Problem: Phase I Progression Outcomes Goal: Initial discharge plan identified Plan discharge home with wife. Equipment and ramp in place.  Problem: Phase II Progression Outcomes Goal: Tolerating diet Tolerates carbohydrate modified diet

## 2010-12-18 ENCOUNTER — Encounter (HOSPITAL_COMMUNITY): Payer: Self-pay | Admitting: Orthopedic Surgery

## 2010-12-18 LAB — GLUCOSE, CAPILLARY
Glucose-Capillary: 191 mg/dL — ABNORMAL HIGH (ref 70–99)
Glucose-Capillary: 181 mg/dL — ABNORMAL HIGH (ref 70–99)
Glucose-Capillary: 160 mg/dL — ABNORMAL HIGH (ref 70–99)

## 2010-12-18 LAB — CBC
HCT: 23.6 % — ABNORMAL LOW (ref 39.0–52.0)
Hemoglobin: 9.7 g/dL — ABNORMAL LOW (ref 13.0–17.0)
MCHC: 35.2 g/dL (ref 30.0–36.0)
Platelets: 150 10*3/uL (ref 150–400)
Platelets: 142 10*3/uL — ABNORMAL LOW (ref 150–400)
RBC: 3.17 MIL/uL — ABNORMAL LOW (ref 4.22–5.81)
RDW: 14.5 % (ref 11.5–15.5)
WBC: 7.4 10*3/uL (ref 4.0–10.5)
WBC: 8.8 10*3/uL (ref 4.0–10.5)

## 2010-12-18 LAB — BASIC METABOLIC PANEL
BUN: 31 mg/dL — ABNORMAL HIGH (ref 6–23)
GFR calc Af Amer: 31 mL/min — ABNORMAL LOW (ref >90)
GFR calc non Af Amer: 27 mL/min — ABNORMAL LOW (ref >90)
Potassium: 3.6 mEq/L (ref 3.5–5.1)

## 2010-12-18 LAB — PREPARE RBC (CROSSMATCH)

## 2010-12-18 MED ORDER — HYDROCODONE-ACETAMINOPHEN 5-325 MG PO TABS: 1.0000 | ORAL_TABLET | ORAL | Status: AC | PRN

## 2010-12-18 MED ORDER — ENOXAPARIN SODIUM 40 MG/0.4ML ~~LOC~~ SOLN: 40.0000 mg | SUBCUTANEOUS | Status: DC

## 2010-12-18 MED ORDER — CELECOXIB 200 MG PO CAPS: 200.0000 mg | ORAL_CAPSULE | Freq: Two times a day (BID) | ORAL | Status: AC

## 2010-12-18 MED ORDER — LACTATED RINGERS IV BOLUS (SEPSIS)
500.0000 mL | Freq: Once | INTRAVENOUS | Status: AC
  Administered 2010-12-18: 500 mL via INTRAVENOUS

## 2010-12-18 MED ORDER — SODIUM CHLORIDE 0.9 % IV SOLN
INTRAVENOUS | Status: DC
  Administered 2010-12-18: 17:00:00 via INTRAVENOUS

## 2010-12-18 MED ORDER — METHOCARBAMOL 500 MG PO TABS: 500.0000 mg | ORAL_TABLET | Freq: Four times a day (QID) | ORAL | Status: AC | PRN

## 2010-12-18 NOTE — Progress Notes (Signed)
OT Cancellation Note   Attempted OT eval earlier today at approximately 10:30, however pt receiving blood at that time and requested to wait until later.  Noted pt participated in PT today but during second session required total +2/3 for transfer back to bed from chair.  Will hold OT eval today and re-attempt 12/19/10  Based on pt's abrupt physical change and also receiving second unit of blood.  Perrin Maltese, OTR/L Pager number [phone] 12/18/2010

## 2010-12-18 NOTE — Progress Notes (Signed)
Physical Therapy Treatment Patient Details Name: [name] MRN: [id number] DOB: 09-05-1930 Today's Date: 12/18/2010  PT Assessment/Plan  PT - Assessment/Plan Comments on Treatment Session: Pt has had a functional decline today; he required +3 for all mobility. RN aware of changes as well as spoke with PA this morning regarding pt progress. Spoke with family regarding potential SNF placement pending pt progress tomorrow; pt upset but agreeable if necessary. Will reassess tomorrow. PT Plan: Discharge plan needs to be updated;Frequency remains appropriate PT Frequency: 7X/week Follow Up Recommendations: Skilled nursing facility (SNF vs [facility] pending progress) Equipment Recommended: None recommended by PT PT Goals  Acute Rehab PT Goals PT Goal Formulation: With patient Time For Goal Achievement: 7 days PT Goal: Supine/Side to Sit - Progress: Other (comment) (Pt not progressing) PT Goal: Sit to Supine/Side - Progress: Other (comment) (Pt not progressing) PT Goal: Sit to Stand - Progress: Other (comment) (Pt not progressing) PT Goal: Stand to Sit - Progress: Other (comment) (Pt not progressing) PT Transfer Goal: Bed to Chair/Chair to Bed - Progress: Other (comment) (Pt not progressing) PT Goal: Ambulate - Progress:  (Pt not progressing; unable to ambulate today) PT Goal: Up/Down Stairs - Progress:  (Pt not progressing; unable to attempt today)  PT Treatment Precautions/Restrictions  Restrictions Weight Bearing Restrictions: Yes RLE Weight Bearing: Weight bearing as tolerated Mobility (including Balance) Bed Mobility Bed Mobility: Yes Sit to Supine - Right: 3: Mod assist Sit to Supine - Right Details (indicate cue type and reason): VC for sequencing. Assist with bilateral LEs Scooting to [facility]: With trapeze;4: Min assist Scooting to [facility] Details (indicate cue type and reason): VC for hand placement. Assisted pt with trapeze and drawsheet towards head of bed Transfers Transfers: Yes Sit to  Stand: From chair/3-in-1;1: +2 Total assist;Patient percentage (comment);With upper extremity assist (Pt 20%; +3) Sit to Stand Details (indicate cue type and reason): Pt required +3 to stand from recliner this session as well as increased assistance to balance to RW once in standing. Stand to Sit: 2: Max assist;To bed;With upper extremity assist Stand to Sit Details: VC for hand placement. Pt unable to control descent into bed Stand Pivot Transfers: 1: +2 Total assist;Patient percentage (comment) (20%; +3 assist) Stand Pivot Transfer Details (indicate cue type and reason): Pt required +3 total assist to pivot to bed from chair. Pt required assist to advance RUE as well as +2 assist to balance in standing throughout transfer. Ambulation/Gait Ambulation/Gait: No    Exercise    End of Session PT - End of Session Equipment Utilized During Treatment: Gait belt Activity Tolerance: Patient limited by fatigue Patient left: in bed;with call bell in reach;with family/visitor present Nurse Communication: Mobility status for transfers;Mobility status for ambulation General Behavior During Session: [facility] for tasks performed Cognition: [facility] for tasks performed  Milana Kidney 12/18/2010, 4:10 PM  12/18/2010 Milana Kidney DPT PAGER: [phone] OFFICE: [phone]

## 2010-12-18 NOTE — Plan of Care (Signed)
Problem: Phase II Progression Outcomes Goal: Ambulates Outcome: Not Progressing Pt too weak today to ambulate; +3 assist to transfer from chair to bed

## 2010-12-18 NOTE — Progress Notes (Signed)
PATIENT ID:      [name]  MRN:     [id number] DOB/AGE:    1930-12-19 / 77 y.o.    PROGRESS NOTE Subjective:  negative for Chest Pain  negative for Shortness of Breath  negative for Nausea/Vomiting   negative for Calf Pain  negative for Bowel Movement   Tolerating Diet: yes         Patient reports pain as 3 on 0-10 scale.    Objective: Vital signs in last 24 hours:  Patient Vitals for the past 24 hrs:  BP Temp Pulse Resp SpO2  12/18/10 0633 90/49 mmHg 96.8 F (36 C) 93  20  93 %  12/17/10 2204 106/55 mmHg 98.4 F (36.9 C) 102  20  92 %  12/17/10 1500 97/40 mmHg 99.4 F (37.4 C) 103  16  93 %      Intake/Output from previous day:   12/04 0701 - 12/05 0700 In: 1740 [P.O.:840; I.V.:900] Out: 400 [Urine:400]   Intake/Output this shift:       Intake/Output      12/04 0701 - 12/05 0700 12/05 0701 - 12/06 0700   P.O. 840    I.V. 900    Total Intake 1740    Urine 400    Drains     Blood     Total Output 400    Net +1340            LABORATORY DATA:  Basename 12/18/10 0604 12/17/10 0630 12/11/10 1046  WBC 7.4 6.5 7.0  HGB 8.3* 9.6* 12.7*  HCT 23.6* 27.5* 36.2*  PLT 150 157 154    Basename 12/18/10 0604 12/17/10 0630 12/11/10 1046  NA 133* 136 139  K 3.6 3.7 3.8  CL 99 101 102  CO2 27 25 25   BUN 31* 20 26*  CREATININE 2.22* 1.33 1.32  GLUCOSE 176* 157* 101*  CALCIUM 7.9* 8.1* 8.7   Lab Results  Component Value Date   INR 0.93 12/11/2010   INR 0.97 03/19/2010    Examination:  General appearance: alert, cooperative and no distress Extremities: extremities normal, atraumatic, no cyanosis or edema and Homans sign is negative, no sign of DVT  Wound Exam: clean, dry, intact   Drainage:  None: wound tissue dry  Motor Exam EHL and FHL Intact  Sensory Exam Deep Peroneal normal  Assessment:    2 Days Post-Op  Procedure(s) (LRB): TOTAL KNEE ARTHROPLASTY (Right)  ADDITIONAL DIAGNOSIS:  Active Problems:  * No active hospital problems. *   Acute  Blood Loss Anemia   Plan: Physical Therapy as ordered Weight Bearing as Tolerated (WBAT)  DVT Prophylaxis:  Lovenox  DISCHARGE PLAN: Home  DISCHARGE NEEDS: HHPT, CPM, Walker and 3-in-1 comode seat         Cravey,Magaby Rumberger 12/18/2010, 7:28 AM

## 2010-12-18 NOTE — Progress Notes (Signed)
PHARMACIST - PHYSICIAN COMMUNICATION DR:  Orthopedics CONCERNING:  METFORMIN SAFE ADMINISTRATION POLICY  RECOMMENDATION: Metformin has been placed on DISCONTINUE (rejected order) STATUS and should be reordered only after any of the conditions below are ruled out.  Current safety recommendations include avoiding metformin for a minimum of 48 hours after the patient's exposure to intravenous contrast media.  DESCRIPTION:  The Pharmacy Committee has adopted a policy that restricts the use of metformin in hospitalized patients until all the contraindications to administration have been ruled out. Specific contraindications are: [x]  Serum creatinine ? 1.5 for males []  Serum creatinine ? 1.4 for females []  Shock, acute MI, sepsis, hypoxemia, dehydration []  Planned administration of intravenous iodinated contrast media []  Heart Failure patients with low EF []  Acute or chronic metabolic acidosis (including DKA)

## 2010-12-18 NOTE — Progress Notes (Signed)
Physical Therapy Treatment Patient Details Name: [name] MRN: [id number] DOB: [birth year] Today's Date: 12/18/2010  PT Assessment/Plan  PT - Assessment/Plan Comments on Treatment Session: Pt with increased fatigue this session. Attempted ambulation, but pt with loss of balance and increased fatigue with transfer. Pt left in chair. PA notified and aware of pt fatigue and progress. Will attempt stairs this afternoon pending pt progress PT Plan: Discharge plan remains appropriate PT Frequency: 7X/week Follow Up Recommendations: Home health PT Equipment Recommended: None recommended by PT PT Goals  Acute Rehab PT Goals PT Goal Formulation: With patient Time For Goal Achievement: 7 days PT Goal: Sit to Supine/Side - Progress:  (Not progressing- fatigued) PT Goal: Sit to Stand - Progress: Other (comment) (Not progressing- fatigued) PT Goal: Stand to Sit - Progress: Other (comment) (Not progressing- fatigued) PT Transfer Goal: Bed to Chair/Chair to Bed - Progress: Other (comment) (Not progressing- fatigued) PT Goal: Ambulate - Progress: Other (comment) (Not progressing- fatigued) PT Goal: Up/Down Stairs - Progress: Other (comment) (unassesed due to fatigue)  PT Treatment Precautions/Restrictions  Restrictions Weight Bearing Restrictions: Yes RLE Weight Bearing: Weight bearing as tolerated Mobility (including Balance) Bed Mobility Bed Mobility: Yes Supine to Sit: 3: Mod assist Supine to Sit Details (indicate cue type and reason): VC for sequencing. Assist with RLE and trunk control Sitting - Scoot to Edge of Bed: 3: Mod assist Sitting - Scoot to Edge of Bed Details (indicate cue type and reason): Pt required consistent cueing for weight shifting to scoot to EOB Transfers Transfers: Yes Sit to Stand: 2: Max assist;From bed;With upper extremity assist Sit to Stand Details (indicate cue type and reason): Pt required increased assistance today secondary to fatigue. VC for hand placement  throughout. Stand to Sit: 3: Mod assist;With armrests;To chair/3-in-1 Stand to Sit Details: Pt required consistent cueing for hand placement to chair. Pt unable to follow directions, uncontrolled descent with assistance. Stand Pivot Transfers: 2: Max Actuary Details (indicate cue type and reason): Assistance given for balance and support secondary to pt fatigue and balance loss with transfers. VC for sequencing of feet and weight shifting Ambulation/Gait Ambulation/Gait: No (pt too fatigued on transfer)    Exercise  Total Joint Exercises Heel Slides: Seated;10 reps;Right;AROM;Strengthening Straight Leg Raises: Supine;AAROM;Strengthening;Right;10 reps Long Arc Quad: Right;Strengthening;AAROM;10 reps;Seated End of Session PT - End of Session Equipment Utilized During Treatment: Gait belt Activity Tolerance: Patient limited by fatigue Patient left: in chair;with call bell in reach;with family/visitor present Nurse Communication: Mobility status for transfers;Mobility status for ambulation;Other (comment) (Pt fatigue) General Behavior During Session: Lethargic Cognition: WFL for tasks performed  Milana Kidney 12/18/2010, 8:52 AM  12/18/2010 Milana Kidney DPT PAGER: [phone] OFFICE: [phone]

## 2010-12-18 NOTE — Discharge Summary (Signed)
PATIENT ID:      [name]  MRN:     [id number] DOB/AGE:    75-15-1932 / 77 y.o.     DISCHARGE SUMMARY  ADMISSION DATE:    12/16/2010 DISCHARGE DATE:   12/18/2010   ADMISSION DIAGNOSIS: OA Right Knee   (osteoarthritis Right Knee )  DISCHARGE DIAGNOSIS:  osteoarthritis Right Knee     ADDITIONAL DIAGNOSIS: Active Problems:  * No active hospital problems. *   Past Medical History  Diagnosis Date  . Hypertension   . Pneumonia   . Diabetes mellitus   . GERD (gastroesophageal reflux disease)   . Arthritis     rheumatoid  . Cataracts, bilateral     PROCEDURE: Procedure(s): TOTAL KNEE ARTHROPLASTY on 12/16/2010  CONSULTS:     HISTORY See H&P in chart  HOSPITAL COURSE:  [name] is a 77 y.o. admitted on 12/16/2010 and found to have a diagnosis of osteoarthritis Right Knee .  After appropriate laboratory studies were obtained  they were taken to the operating room on 12/16/2010 and underwent Procedure(s): TOTAL KNEE ARTHROPLASTY.   They were given perioperative antibiotics:  Anti-infectives     Start     Dose/Rate Route Frequency Ordered Stop   12/16/10 1754   hydroxychloroquine (PLAQUENIL) tablet 200 mg        200 mg Oral Daily 12/16/10 1544     12/16/10 1656   ceFAZolin (ANCEF) IVPB 2 g/50 mL premix        2 g 100 mL/hr over 30 Minutes Intravenous Every 6 hours 12/16/10 1544 12/21/10 0546   12/15/10 0915   ceFAZolin (ANCEF) IVPB 2 g/50 mL premix        2 g 100 mL/hr over 30 Minutes Intravenous 60 min pre-op 12/15/10 0908 12/16/10 1028        . Blood products given:none  The remainder of the hospital course was dedicated to ambulation and strengthening.   The patient was discharged on 2 Days Post-Op in  Good condition.   DIAGNOSTIC STUDIES: Recent vital signs: Patient Vitals for the past 24 hrs:  BP Temp Pulse Resp SpO2  12/18/10 0633 90/49 mmHg 96.8 F (36 C) 93  20  93 %  Dec 21, 2010 2204 106/55 mmHg 98.4 F (36.9 C) 102  20  92 %  2010/12/21 1500 97/40 mmHg  99.4 F (37.4 C) 103  16  93 %       Recent laboratory studies:  Basename 12/18/10 0604 December 21, 2010 0630 12/11/10 1046  WBC 7.4 6.5 7.0  HGB 8.3* 9.6* 12.7*  HCT 23.6* 27.5* 36.2*  PLT 150 157 154    Basename 12/18/10 0604 12/21/2010 0630 12/11/10 1046  NA 133* 136 139  K 3.6 3.7 3.8  CL 99 101 102  CO2 27 25 25   BUN 31* 20 26*  CREATININE 2.22* 1.33 1.32  GLUCOSE 176* 157* 101*  CALCIUM 7.9* 8.1* 8.7   Lab Results  Component Value Date   INR 0.93 12/11/2010   INR 0.97 03/19/2010     Recent Radiographic Studies :  No results found.  DISCHARGE INSTRUCTIONS: Discharge Orders    Future Orders Please Complete By Expires   Diet - low sodium heart healthy      Call MD / Call 911      Comments:   If you experience chest pain or shortness of breath, CALL 911 and be transported to the hospital emergency room.  If you develope a fever above 101 F, pus (white drainage) or  increased drainage or redness at the wound, or calf pain, call your surgeon's office.   Constipation Prevention      Comments:   Drink plenty of fluids.  Prune juice may be helpful.  You may use a stool softener, such as Colace (over the counter) 100 mg twice a day.  Use MiraLax (over the counter) for constipation as needed.   Increase activity slowly as tolerated      Weight Bearing as taught in Physical Therapy      Comments:   Use a walker or crutches as instructed.   Patient may shower      Comments:   You may shower without a dressing once there is no drainage.  Do not wash over the wound.  If drainage remains, cover wound with plastic wrap and then shower.   Bed to Chair Transfer      Driving restrictions      Comments:   No driving for 6 weeks   Lifting restrictions      Comments:   No lifting for 6 weeks   CPM      Comments:   Continuous passive motion machine (CPM):      Use the CPM from 0 to 90 for 6-8 hours per day.      You may increase by 10 per day.  You may break it up into 2 or 3 sessions per  day.      Use CPM for 2 weeks or until you are told to stop.   TED hose      Comments:   Use stockings (TED hose) for 3 weeks on both leg(s).  You may remove them at night for sleeping.   Change dressing      Comments:   Change dressing on thursday, then change the dressing daily with sterile 4 x 4 inch gauze dressing and apply TED hose.  You may clean the incision with alcohol prior to redressing.   Do not put a pillow under the knee. Place it under the heel.         DISCHARGE MEDICATIONS:  Current Discharge Medication List    START taking these medications   Details  celecoxib (CELEBREX) 200 MG capsule Take 1 capsule (200 mg total) by mouth 2 (two) times daily. Qty: 30 capsule, Refills: 0    enoxaparin (LOVENOX) 40 MG/0.4ML SOLN Inject 0.4 mLs (40 mg total) into the skin daily. Qty: 10 Syringe, Refills: 0    HYDROcodone-acetaminophen (NORCO) 5-325 MG per tablet Take 1-2 tablets by mouth every 4 (four) hours as needed. Qty: 60 tablet, Refills: 0    methocarbamol (ROBAXIN) 500 MG tablet Take 1 tablet (500 mg total) by mouth every 6 (six) hours as needed. Qty: 60 tablet, Refills: 0      CONTINUE these medications which have NOT CHANGED   Details  diltiazem (CARDIZEM CD) 360 MG 24 hr capsule Take 360 mg by mouth daily.      folic acid (FOLVITE) 1 MG tablet Take 1 mg by mouth daily.      hydroxychloroquine (PLAQUENIL) 200 MG tablet Take 200 mg by mouth daily.      lisinopril (PRINIVIL,ZESTRIL) 20 MG tablet Take 20 mg by mouth daily.      lovastatin (MEVACOR) 20 MG tablet Take 20 mg by mouth at bedtime.      metFORMIN (GLUCOPHAGE) 1000 MG tablet Take 1,000 mg by mouth 2 (two) times daily with a meal.      methylPREDNISolone (MEDROL) 2 MG  tablet Take 2.5 mg by mouth once a week.      ranitidine (ZANTAC) 150 MG tablet Take 150 mg by mouth 2 (two) times daily.      temazepam (RESTORIL) 15 MG capsule Take 15 mg by mouth at bedtime.      terazosin (HYTRIN) 5 MG capsule Take 5  mg by mouth at bedtime.      inFLIXimab (REMICADE) 100 MG injection Inject 100 mg into the vein. 10 units every 7 weeks         FOLLOW UP VISIT:   Follow-up Information    Follow up with Raymon Mutton, MD. Call on 12/31/2010.   Contact information:   [address] [place] [address] [phone]          DISPOSITION:  Home  Home-Health Care Svc  CONDITION:  Good   Berko,Jeovanny Cuadros 12/18/2010, 7:39 AM

## 2010-12-19 LAB — TYPE AND SCREEN
ABO/RH(D): A POS
Antibody Screen: NEGATIVE
Unit division: 0
Unit division: 0

## 2010-12-19 LAB — CBC
HCT: 25.9 % — ABNORMAL LOW (ref 39.0–52.0)
MCHC: 35.1 g/dL (ref 30.0–36.0)
Platelets: 146 10*3/uL — ABNORMAL LOW (ref 150–400)
RDW: 14.7 % (ref 11.5–15.5)
WBC: 6.9 10*3/uL (ref 4.0–10.5)

## 2010-12-19 LAB — BASIC METABOLIC PANEL
BUN: 32 mg/dL — ABNORMAL HIGH (ref 6–23)
Chloride: 102 mEq/L (ref 96–112)
GFR calc Af Amer: 40 mL/min — ABNORMAL LOW (ref >90)
GFR calc non Af Amer: 35 mL/min — ABNORMAL LOW (ref >90)
Potassium: 3.6 mEq/L (ref 3.5–5.1)

## 2010-12-19 LAB — GLUCOSE, CAPILLARY
Glucose-Capillary: 187 mg/dL — ABNORMAL HIGH (ref 70–99)
Glucose-Capillary: 190 mg/dL — ABNORMAL HIGH (ref 70–99)

## 2010-12-19 NOTE — Progress Notes (Signed)
Discharge planning. Patient preoperatively setup with [facility], DME has been delivered to pt's home. At this time he may need SNF for shortterm rehab,. Social worker is aware. will follow

## 2010-12-19 NOTE — Progress Notes (Signed)
Physical Therapy Treatment Patient Details Name: [name] MRN: [id number] DOB: 14-Feb-1930 Today's Date: 12/19/2010  PT Assessment/Plan  PT - Assessment/Plan Comments on Treatment Session: Pt has made great improvement since last session, although still requires +2 assist for sit to stand and max Assist throughout ambulation. Pt would benefit from short term SNF for increased rehab for safety at home for the patient and his family. PT Plan: Discharge plan remains appropriate;Frequency remains appropriate PT Frequency: 7X/week Follow Up Recommendations: Skilled nursing facility Equipment Recommended: Defer to next venue PT Goals  Acute Rehab PT Goals PT Goal Formulation: With patient Time For Goal Achievement: 7 days PT Goal: Supine/Side to Sit - Progress: Progressing toward goal PT Goal: Sit to Supine/Side - Progress: Progressing toward goal PT Goal: Sit to Stand - Progress: Progressing toward goal PT Goal: Stand to Sit - Progress: Progressing toward goal PT Transfer Goal: Bed to Chair/Chair to Bed - Progress: Progressing toward goal PT Goal: Ambulate - Progress: Progressing toward goal PT Goal: Up/Down Stairs - Progress: Other (comment) (unassessed today)  PT Treatment Precautions/Restrictions  Precautions Precautions: Knee Required Braces or Orthoses: No Restrictions Weight Bearing Restrictions: Yes RLE Weight Bearing: Weight bearing as tolerated Mobility (including Balance) Bed Mobility Bed Mobility: Yes Supine to Sit: 3: Mod assist;HOB flat Supine to Sit Details (indicate cue type and reason): VC for hand placement. Pt able to move RLE with LLE in flexion bridging. Pt required assist for trunk control into sitting. Sitting - Scoot to Edge of Bed: 3: Mod assist Sitting - Scoot to Edge of Bed Details (indicate cue type and reason): VC for hand placement and weight shifting to get to edge of bed Scooting to [facility]: 3: Mod assist (pt demonstrates weight shift to advance  RLE) Transfers Transfers: Yes Sit to Stand: From chair/3-in-1;With upper extremity assist;1: +2 Total assist;Patient percentage (comment) (Pt 50%) Sit to Stand Details (indicate cue type and reason): VC for hand placement. Assist with standing balance and stability. Pt leaning posteriorly upon standing and required continuous cueing to tuck his bottom under. Stand to Sit: 2: Max assist;To chair/3-in-1;With upper extremity assist Stand to Sit Details: VC for hand placement. Pt unable to control descent. Ambulation/Gait Ambulation/Gait: Yes Ambulation/Gait Assistance: 2: Max assist Ambulation/Gait Assistance Details (indicate cue type and reason): VC throughout session for increased heel strike and for proper sequencing and safety to RW. Pt posterior leaned throughout ambulation and required increased cueing to stand upright for safety. Max A for balance and stability throughout gait. Ambulation Distance (Feet): 10 Feet Assistive device: Rolling walker Gait Pattern: Step-to pattern;Decreased step length - left;Decreased stance time - right;Decreased hip/knee flexion - right;Trunk flexed Gait velocity: Decreased gait speed Stairs: No    Exercise  Total Joint Exercises Quad Sets: AROM;10 reps;Strengthening;Right Heel Slides: Supine;10 reps;AAROM;Strengthening;Right Straight Leg Raises: AAROM;Strengthening;Right;10 reps;Supine End of Session PT - End of Session Equipment Utilized During Treatment: Gait belt Activity Tolerance: Patient limited by fatigue Patient left: with call bell in reach;with family/visitor present;in chair Nurse Communication: Mobility status for transfers;Mobility status for ambulation General Behavior During Session: [facility] for tasks performed Cognition: [facility] for tasks performed  Milana Kidney 12/19/2010, 10:32 AM 12/19/2010 Milana Kidney DPT PAGER: [phone] OFFICE: [phone]

## 2010-12-19 NOTE — Progress Notes (Signed)
PATIENT ID:      [name]  MRN:     [id number] DOB/AGE:    1930-11-26 / 77 y.o.    PROGRESS NOTE Subjective:  negative for Chest Pain  negative for Shortness of Breath  negative for Nausea/Vomiting   negative for Calf Pain  negative for Bowel Movement   Tolerating Diet: yes         Patient reports pain as 4 on 0-10 scale.    Objective: Vital signs in last 24 hours:  Patient Vitals for the past 24 hrs:  BP Temp Pulse Resp SpO2 Height Weight  12/19/10 0952 113/67 mmHg - - - - - -  12/19/10 0949 113/67 mmHg - - - - - -  12/19/10 5621 - - - - - 5' 8.9" (1.75 m) 91.5 kg (201 lb 11.5 oz)  12/19/10 0522 133/55 mmHg 98.5 F (36.9 C) 96  18  97 % - -  12/18/10 2200 118/68 mmHg 98.5 F (36.9 C) 91  18  97 % - -  12/18/10 1715 102/61 mmHg 98.8 F (37.1 C) 99  18  - - -  12/18/10 1624 101/62 mmHg 98.3 F (36.8 C) 91  18  - - -  12/18/10 1527 108/67 mmHg 99.3 F (37.4 C) 97  18  - - -  12/18/10 1455 110/50 mmHg - - - - - -  12/18/10 1435 105/61 mmHg 98.5 F (36.9 C) 96  18  - - -  12/18/10 1345 83/39 mmHg 97.8 F (36.6 C) 82  16  - - -  12/18/10 1245 90/40 mmHg 97.8 F (36.6 C) 86  18  - - -  12/18/10 1140 86/40 mmHg 97.5 F (36.4 C) 90  18  - - -      Intake/Output from previous day:   12/05 0701 - 12/06 0700 In: 1740 [P.O.:240; I.V.:800] Out: 553 [Urine:550]   Intake/Output this shift:       Intake/Output      12/05 0701 - 12/06 0700 12/06 0701 - 12/07 0700   P.O. 240    I.V. (mL/kg) 800    Blood 700    Total Intake(mL/kg) 1740    Urine (mL/kg/hr) 550    Stool 3    Total Output 553    Net +1187         Urine Occurrence 2 x       LABORATORY DATA:  Basename 12/19/10 0640 12/18/10 1948 12/18/10 0604 12/17/10 0630  WBC 6.9 8.8 7.4 6.5  HGB 9.1* 9.7* 8.3* 9.6*  HCT 25.9* 27.1* 23.6* 27.5*  PLT 146* 142* 150 157    Basename 12/19/10 0640 12/18/10 0604 12/17/10 0630  NA 134* 133* 136  K 3.6 3.6 3.7  CL 102 99 101  CO2 25 27 25   BUN 32* 31* 20  CREATININE  1.79* 2.22* 1.33  GLUCOSE 172* 176* 157*  CALCIUM 8.0* 7.9* 8.1*   Lab Results  Component Value Date   INR 0.93 12/11/2010   INR 0.97 03/19/2010    Examination:  General appearance: alert, cooperative and no distress Extremities: extremities normal, atraumatic, no cyanosis or edema and Homans sign is negative, no sign of DVT  Wound Exam: clean, dry, intact   Drainage:  None: wound tissue dry  Motor Exam EHL and FHL Intact  Sensory Exam Deep Peroneal normal  Assessment:    3 Days Post-Op  Procedure(s) (LRB): TOTAL KNEE ARTHROPLASTY (Right)  ADDITIONAL DIAGNOSIS:  Active Problems:  * No active hospital problems. *  Acute Blood Loss Anemia   Plan: Physical Therapy as ordered Weight Bearing as Tolerated (WBAT)  DVT Prophylaxis:  Lovenox  DISCHARGE PLAN: skilled nursing facility  DISCHARGE NEEDS: HHPT, CPM, Walker and 3-in-1 comode seat         [name],[name] 12/19/2010, 10:40 AM

## 2010-12-19 NOTE — Plan of Care (Signed)
Problem: Phase II Progression Outcomes Goal: Discharge plan established Outcome: Progressing Pt requires total+2 assist with sit<>stand due to posterior lean. Pt unsafe to d/c home. Recommend SNF

## 2010-12-19 NOTE — Progress Notes (Signed)
Occupational Therapy Evaluation Patient Details Name: [name] MRN: [id number] DOB: 14-Feb-1930 Today's Date: 12/19/2010  Problem List: There is no problem list on file for this patient.   Past Medical History:  Past Medical History  Diagnosis Date  . Hypertension   . Pneumonia   . Diabetes mellitus   . GERD (gastroesophageal reflux disease)   . Arthritis     rheumatoid  . Cataracts, bilateral    Past Surgical History:  Past Surgical History  Procedure Date  . Shoulder surgery     left, done in March 2012  . Rotator cuff repair     right side  . Foot surgery     left foot surgery at 77 year old then again to left foot 1984  . Eye surgery     bilateral cataract surgery lens inplant  . Tonsillectomy   . Total knee arthroplasty 12/16/2010    Procedure: TOTAL KNEE ARTHROPLASTY;  Surgeon: Raymon Mutton, MD;  Location: [facility] OR;  Service: Orthopedics;  Laterality: Right;    OT Assessment/Plan/Recommendation OT Assessment Clinical Impression Statement: 77 yo male s/p Rt TKA presents with decr bed mobility, decr activity tolerance, decr independence with BADL and could benefit from acute OT to maximize independence for d/c planning OT Recommendation/Assessment: Patient will need skilled OT in the acute care venue OT Problem List: Decreased strength;Decreased activity tolerance;Impaired balance (sitting and/or standing);Decreased safety awareness;Decreased knowledge of use of DME or AE;Decreased knowledge of precautions;Pain Barriers to Discharge: Other (comment) (wife with limited ability to A) Barriers to Discharge Comments: wife with cognition deficits minor OT Therapy Diagnosis : Generalized weakness;Acute pain OT Plan OT Frequency: Min 2X/week OT Treatment/Interventions: Self-care/ADL training;Energy conservation;DME and/or AE instruction;Therapeutic activities;Patient/family education;Balance training OT Recommendation Follow Up Recommendations: Skilled nursing  facility Equipment Recommended: Defer to next venue Individuals Consulted Consulted and Agree with Results and Recommendations: Patient;Family member/caregiver OT Goals Acute Rehab OT Goals OT Goal Formulation: With patient/family Time For Goal Achievement: 2 weeks ADL Goals Pt Will Perform Grooming: with set-up;Sitting, chair;Supported Pt Will Perform Upper Body Bathing: with set-up;Sitting, chair;Supported Pt Will Perform Lower Body Bathing: with min assist;Supported;Sit to stand from chair (AE PRN) Pt Will Perform Upper Body Dressing: with set-up;Sitting, chair;Standing Pt Will Perform Lower Body Dressing: with min assist;Sit to stand from chair;Supported;with adaptive equipment (PRN) Pt Will Transfer to Toilet: with max assist;3-in-1;Stand pivot transfer Pt Will Perform Toileting - Clothing Manipulation: with min assist;Sitting on 3-in-1 or toilet Pt Will Perform Toileting - Hygiene: with min assist;Sit to stand from 3-in-1/toilet Miscellaneous OT Goals Miscellaneous OT Goal #1: Pt will perform bed mobility hob <20 degrees no bed rail S level as precursor to ADLS and d/c home  OT Evaluation Precautions/Restrictions  Precautions Precautions: Knee Required Braces or Orthoses: No Restrictions Weight Bearing Restrictions: Yes RLE Weight Bearing: Weight bearing as tolerated Prior Functioning Home Living Lives With: Spouse Receives Help From: Family Type of Home: House Home Layout: One level Home Access: Ramped entrance Bathroom Shower/Tub: Walk-in shower;Door [employer] Toilet: Handicapped height Bathroom Accessibility: Yes How Accessible: Accessible via walker Home Adaptive Equipment: Walker - rolling;Straight cane Prior Function Level of Independence: Independent with basic ADLs;Independent with gait;Independent with transfers;Independent with homemaking with ambulation Able to Take Stairs?: Yes Driving: Yes Vocation: Retired ADL ADL Lower Body Bathing: Simulated;Maximal  assistance Where Assessed - Lower Body Bathing: Sitting, chair;Shower (pt able to reach just below the knee on RLE, mid calf LLE) Toilet Transfer: Simulated;+2 Total assistance;Comment for patient % Toilet Transfer Details (indicate cue  type and reason): pt= 50% mod v/c for safety and hand placement. Pt with posterior lean initially sit<>stand with BIL LE braced against bed for stability. Pt provided facilitation at sternum and sacrum for upright posture. Pt educated on RW sequence mod v/c. Toilet Transfer Method: Other (comment) (sit<>stand EOB<>chair) Toileting - Clothing Manipulation: Maximal assistance;Simulated Where Assessed - Toileting Clothing Manipulation: Sit to stand from 3-in-1 or toilet Toileting - Hygiene: Simulated;Minimal assistance Where Assessed - Toileting Hygiene: Sit to stand from 3-in-1 or toilet Equipment Used: Rolling walker ADL Comments: Pt with previous BIL shoulder surgs. pt shoulder ROM WFL BIL able to touch top of head and able to touch small of back. Pt with fair balance EOB sitting reach backward. Pt with balanace deficits and unsafe to perform hyigene or toilet transfer alone. Vision/Perception  Vision - History Baseline Vision: Bifocals Patient Visual Report: No change from baseline Cognition Cognition Arousal/Alertness: Awake/alert Overall Cognitive Status: Appears within functional limits for tasks assessed Orientation Level: Oriented X4 Sensation/Coordination Coordination Gross Motor Movements are Fluid and Coordinated: Yes Fine Motor Movements are Fluid and Coordinated: Yes Extremity Assessment RUE Assessment RUE Assessment: Within Functional Limits LUE Assessment LUE Assessment: Within Functional Limits Mobility  Bed Mobility Bed Mobility: Yes Supine to Sit: 3: Mod assist;HOB flat Supine to Sit Details (indicate cue type and reason): Pt pulling on mattress and extending RUE for A. Pt using LLE knee flexion to bridge toward EOB. Pt advanced BIL LE  to eob S level. Pt required A supine<>sit eob. Sitting - Scoot to Edge of Bed: 3: Mod assist Scooting to [facility]: 3: Mod assist (pt demonstrates weight shift to advance RLE) Transfers Transfers: Yes Sit to Stand: From chair/3-in-1;With upper extremity assist;1: +2 Total assist;Patient percentage (comment) (pt 50%) Stand to Sit: 2: Max assist;To chair/3-in-1;With upper extremity assist Exercises   End of Session OT - End of Session Equipment Utilized During Treatment: Gait belt Activity Tolerance: Patient tolerated treatment well Patient left: in chair;with call bell in reach;with family/visitor present (wife) Nurse Communication: Mobility status for transfers;Mobility status for ambulation General Behavior During Session: [facility] for tasks performed Cognition: [facility] for tasks performed  Next session: Bed mobility, sit<>stand transfer, toilet transfer  RECOMMEND: 3N1 bedside with RN staff = total +2 A due to posterior lean with initial sit<>stand, stand pivot min A with mod v/c   Olander, Friedl [facility] 12/19/2010, 9:54 AM  Pager: [phone]

## 2010-12-19 NOTE — Progress Notes (Signed)
Met with patient who tells me he may need SNF rehab or he may be able to go home- "we'll know tomorrow". Patient agreed to SNF search to look at possible SNF options- FL2 placed in shadow chart in [facility] for MD review and signature. Will proceed with SNF search and advise. Reece Levy, MSW, Theresia Majors [phone]

## 2010-12-20 LAB — BASIC METABOLIC PANEL
BUN: 25 mg/dL — ABNORMAL HIGH (ref 6–23)
CO2: 27 mEq/L (ref 19–32)
Chloride: 104 mEq/L (ref 96–112)
GFR calc Af Amer: 48 mL/min — ABNORMAL LOW (ref >90)
Potassium: 4 mEq/L (ref 3.5–5.1)

## 2010-12-20 LAB — GLUCOSE, CAPILLARY: Glucose-Capillary: 124 mg/dL — ABNORMAL HIGH (ref 70–99)

## 2010-12-20 NOTE — Progress Notes (Signed)
Met with patient, wife, daughter and son-in-law. Patient has elected to return home with DME and Home Health arrangements. Patient will be followed by [facility]. Patient discussed with Vance Peper, RNCM. Patient states he has a ramp to get into the home; denies any further SW needs. SW signing of.  Darylene Price, BSW,  12/20/2010 2:51 PM

## 2010-12-20 NOTE — Progress Notes (Signed)
Occupational Therapy Treatment Patient Details Name: [name] MRN: [id number] DOB: 10-01-1930 Today's Date: 12/20/2010  OT Assessment/Plan OT Assessment/Plan Comments on Treatment Session: Pt making progress towards goals.  Appropriate to plan d/c home with family assist. OT Plan: Discharge plan needs to be updated OT Frequency: Min 2X/week Follow Up Recommendations: 24 hour supervision/assistance Equipment Recommended: 3 in 1 bedside comode OT Goals Acute Rehab OT Goals OT Goal Formulation: With patient/family Time For Goal Achievement: 2 weeks ADL Goals Pt Will Perform Grooming: with set-up;Sitting, chair;Supported ADL Goal: Grooming - Progress: Not addressed Pt Will Perform Upper Body Bathing: with set-up;Sitting, chair;Supported ADL Goal: Product manager - Progress: Not addressed Pt Will Perform Lower Body Bathing: with min assist;Supported;Sit to stand from chair ADL Goal: Lower Body Bathing - Progress: Not addressed Pt Will Perform Upper Body Dressing: with set-up;Sitting, chair;Standing ADL Goal: Upper Body Dressing - Progress: Not addressed Pt Will Perform Lower Body Dressing: with min assist;Sit to stand from chair;Supported;with adaptive equipment ADL Goal: Lower Body Dressing - Progress: Not addressed Pt Will Transfer to Toilet: with max assist;3-in-1;Stand pivot transfer ADL Goal: Toilet Transfer - Progress: Not addressed Pt Will Perform Toileting - Clothing Manipulation: with min assist;Sitting on 3-in-1 or toilet ADL Goal: Toileting - Clothing Manipulation - Progress: Not addressed Pt Will Perform Toileting - Hygiene: with min assist;Sit to stand from 3-in-1/toilet ADL Goal: Toileting - Hygiene - Progress: Not addressed Miscellaneous OT Goals Miscellaneous OT Goal #1: Pt will perform bed mobility hob <20 degrees no bed rail S level as precursor to ADLS and d/c home  OT Treatment Precautions/Restrictions  Restrictions Weight Bearing Restrictions: Yes RLE  Weight Bearing: Weight bearing as tolerated   ADL ADL Toilet Transfer: Performed;Minimal assistance Toilet Transfer Details (indicate cue type and reason): VC for hand placement and sequencing Toilet Transfer Method: Freight forwarder Equipment: Raised toilet seat with arms (or 3-in-1 over toilet) Toileting - Clothing Manipulation: Performed;Supervision/safety Toileting - Clothing Manipulation Details (indicate cue type and reason): Pt pulled gown up with supervision for safety  Where Assessed - Toileting Clothing Manipulation: Standing Toileting - Hygiene: Performed;Independent Where Assessed - Toileting Hygiene: Sit to stand from 3-in-1 or toilet Equipment Used: Rolling walker Mobility  Bed Mobility Bed Mobility: Yes Supine to Sit: 4: Min assist;HOB flat Supine to Sit Details (indicate cue type and reason): VC for sequencing and hand placement. Min assist to RLE Sitting - Scoot to [place] of Bed: 5: Supervision Sitting - Scoot to [place] of Bed Details (indicate cue type and reason): VC for weight shifting to move R hip closer to EOB Transfers Transfers: Yes Sit to Stand: 4: Min assist;From bed;From chair/3-in-1;With upper extremity assist;With armrests;3: Mod assist Sit to Stand Details (indicate cue type and reason): VC for hand placement and sequencing. Pt required mod assist for first sit to stand from bed. Mod assist from chair. Min asist from 3n1 over toilet. Stand to Sit: 3: Mod assist;With upper extremity assist;To chair/3-in-1;With armrests Stand to Sit Details: VC for hand placement and sequencing. Pt with uncontrolled descent. Exercises    End of Session OT - End of Session Equipment Utilized During Treatment: Gait belt Activity Tolerance: Patient tolerated treatment well Patient left: in chair;with call bell in reach;with family/visitor present General Behavior During Session: [facility] for tasks performed Cognition: [facility] for tasks performed  [name], [name]    12/20/2010, 10:18 AM 12/20/2010 [name] OTR/L Pager [phone] Office [phone]

## 2010-12-20 NOTE — Discharge Summary (Signed)
PATIENT ID:      [name]  MRN:     [id number] DOB/AGE:    June 22, 1930 / 77 y.o.     DISCHARGE SUMMARY  ADMISSION DATE:    12/16/2010 DISCHARGE DATE:   12/20/2010   ADMISSION DIAGNOSIS: OA Right Knee   (osteoarthritis Right Knee )  DISCHARGE DIAGNOSIS:  osteoarthritis Right Knee     ADDITIONAL DIAGNOSIS: Active Problems:  * No active hospital problems. *   Past Medical History  Diagnosis Date  . Hypertension   . Pneumonia   . Diabetes mellitus   . GERD (gastroesophageal reflux disease)   . Arthritis     rheumatoid  . Cataracts, bilateral     PROCEDURE: Procedure(s): TOTAL KNEE ARTHROPLASTY on 12/16/2010  CONSULTS:     HISTORY:  See H&P in chart  HOSPITAL COURSE:  [name] is a 77 y.o. admitted on 12/16/2010 and found to have a diagnosis of osteoarthritis Right Knee .  After appropriate laboratory studies were obtained  they were taken to the operating room on 12/16/2010 and underwent Procedure(s): TOTAL KNEE ARTHROPLASTY.   They were given perioperative antibiotics:  Anti-infectives     Start     Dose/Rate Route Frequency Ordered Stop   12/16/10 1754   hydroxychloroquine (PLAQUENIL) tablet 200 mg        200 mg Oral Daily 12/16/10 1544     12/16/10 1656   ceFAZolin (ANCEF) IVPB 2 g/50 mL premix        2 g 100 mL/hr over 30 Minutes Intravenous Every 6 hours 12/16/10 1544 12/17/10 0546   12/15/10 0915   ceFAZolin (ANCEF) IVPB 2 g/50 mL premix        2 g 100 mL/hr over 30 Minutes Intravenous 60 min pre-op 12/15/10 0908 12/16/10 1028        . Blood products given:2 units CC PRBC   The remainder of the hospital course was dedicated to ambulation and strengthening.   The patient was discharged on 4 Days Post-Op in  Good condition.   DIAGNOSTIC STUDIES: Recent vital signs: Patient Vitals for the past 24 hrs:  BP Temp Temp src Pulse Resp SpO2  12/20/10 0530 120/62 mmHg 97.6 F (36.4 C) - 72  18  96 %  12/19/10 2100 118/77 mmHg 98.6 F (37 C) Oral 88  18  98  %  12/19/10 1400 117/70 mmHg 97.8 F (36.6 C) - 87  18  95 %       Recent laboratory studies:  Basename 12/19/10 0640 01-05-2011 1948 Jan 05, 2011 0604 12/17/10 0630  WBC 6.9 8.8 7.4 6.5  HGB 9.1* 9.7* 8.3* 9.6*  HCT 25.9* 27.1* 23.6* 27.5*  PLT 146* 142* 150 157    Basename 12/20/10 0503 12/19/10 0640 2011-01-05 0604 12/17/10 0630  NA 137 134* 133* 136  K 4.0 3.6 3.6 3.7  CL 104 102 99 101  CO2 27 25 27 25   BUN 25* 32* 31* 20  CREATININE 1.55* 1.79* 2.22* 1.33  GLUCOSE 143* 172* 176* 157*  CALCIUM 8.1* 8.0* 7.9* 8.1*   Lab Results  Component Value Date   INR 0.93 12/11/2010   INR 0.97 03/19/2010     Recent Radiographic Studies :  No results found.  DISCHARGE INSTRUCTIONS: Discharge Orders    Future Orders Please Complete By Expires   Diet - low sodium heart healthy      Call MD / Call 911      Comments:   If you experience chest pain or  shortness of breath, CALL 911 and be transported to the hospital emergency room.  If you develope a fever above 101 F, pus (white drainage) or increased drainage or redness at the wound, or calf pain, call your surgeon's office.   Constipation Prevention      Comments:   Drink plenty of fluids.  Prune juice may be helpful.  You may use a stool softener, such as Colace (over the counter) 100 mg twice a day.  Use MiraLax (over the counter) for constipation as needed.   Increase activity slowly as tolerated      Weight Bearing as taught in Physical Therapy      Comments:   Use a walker or crutches as instructed.   Patient may shower      Comments:   You may shower without a dressing once there is no drainage.  Do not wash over the wound.  If drainage remains, cover wound with plastic wrap and then shower.   Bed to Chair Transfer      Driving restrictions      Comments:   No driving for 6 weeks   Lifting restrictions      Comments:   No lifting for 6 weeks   CPM      Comments:   Continuous passive motion machine (CPM):      Use the CPM  from 0 to 90 for 6-8 hours per day.      You may increase by 10 per day.  You may break it up into 2 or 3 sessions per day.      Use CPM for 2 weeks or until you are told to stop.   TED hose      Comments:   Use stockings (TED hose) for 3 weeks on both leg(s).  You may remove them at night for sleeping.   Change dressing      Comments:   Change dressing on thursday, then change the dressing daily with sterile 4 x 4 inch gauze dressing and apply TED hose.  You may clean the incision with alcohol prior to redressing.   Do not put a pillow under the knee. Place it under the heel.         DISCHARGE MEDICATIONS:  Current Discharge Medication List    START taking these medications   Details  celecoxib (CELEBREX) 200 MG capsule Take 1 capsule (200 mg total) by mouth 2 (two) times daily. Qty: 30 capsule, Refills: 0    enoxaparin (LOVENOX) 40 MG/0.4ML SOLN Inject 0.4 mLs (40 mg total) into the skin daily. Qty: 10 Syringe, Refills: 0    HYDROcodone-acetaminophen (NORCO) 5-325 MG per tablet Take 1-2 tablets by mouth every 4 (four) hours as needed. Qty: 60 tablet, Refills: 0    methocarbamol (ROBAXIN) 500 MG tablet Take 1 tablet (500 mg total) by mouth every 6 (six) hours as needed. Qty: 60 tablet, Refills: 0      CONTINUE these medications which have NOT CHANGED   Details  diltiazem (CARDIZEM CD) 360 MG 24 hr capsule Take 360 mg by mouth daily.      folic acid (FOLVITE) 1 MG tablet Take 1 mg by mouth daily.      hydroxychloroquine (PLAQUENIL) 200 MG tablet Take 200 mg by mouth daily.      lisinopril (PRINIVIL,ZESTRIL) 20 MG tablet Take 20 mg by mouth daily.      lovastatin (MEVACOR) 20 MG tablet Take 20 mg by mouth at bedtime.  metFORMIN (GLUCOPHAGE) 1000 MG tablet Take 1,000 mg by mouth 2 (two) times daily with a meal.      methylPREDNISolone (MEDROL) 2 MG tablet Take 2.5 mg by mouth once a week.      ranitidine (ZANTAC) 150 MG tablet Take 150 mg by mouth 2 (two) times daily.       temazepam (RESTORIL) 15 MG capsule Take 15 mg by mouth at bedtime.      terazosin (HYTRIN) 5 MG capsule Take 5 mg by mouth at bedtime.      inFLIXimab (REMICADE) 100 MG injection Inject 100 mg into the vein. 10 units every 7 weeks         FOLLOW UP VISIT:   Follow-up Information    Follow up with Raymon Mutton, MD. Call on 12/31/2010.   Contact information:   [address] [place] [address] [phone]          DISPOSITION:  Home  Home-Health Care Svc  CONDITION:  Good   Older,Lashawnda Hancox 12/20/2010, 1:54 PM

## 2010-12-20 NOTE — Progress Notes (Signed)
Physical Therapy Treatment Patient Details Name: [name] MRN: [id number] DOB: [birth year] Today's Date: 12/20/2010  PT Assessment/Plan  PT - Assessment/Plan Comments on Treatment Session: Pt has improved since last session. He was able to stand with assist of 1 as well as complete bed mobility on a flat bed without rails. Ambulation distance and technique improved dramatically. Pt safe to go home with min/mod assist from family members and 24//7 supervision for safety. PT Plan: Discharge plan needs to be updated;Frequency remains appropriate PT Frequency: 7X/week Follow Up Recommendations: Home health PT Equipment Recommended: 3 in 1 bedside comode PT Goals  Acute Rehab PT Goals PT Goal Formulation: With patient Time For Goal Achievement: 7 days PT Goal: Supine/Side to Sit - Progress: Progressing toward goal PT Goal: Sit to Supine/Side - Progress: Progressing toward goal PT Goal: Sit to Stand - Progress: Progressing toward goal PT Goal: Stand to Sit - Progress: Progressing toward goal PT Transfer Goal: Bed to Chair/Chair to Bed - Progress: Progressing toward goal PT Goal: Ambulate - Progress: Progressing toward goal PT Goal: Up/Down Stairs - Progress: Discontinued (comment) (no stairs at home)  PT Treatment Precautions/Restrictions  Precautions Precautions: Knee Required Braces or Orthoses: No Restrictions Weight Bearing Restrictions: Yes RLE Weight Bearing: Weight bearing as tolerated Mobility (including Balance) Bed Mobility Bed Mobility: Yes Supine to Sit: 4: Min assist;HOB flat Supine to Sit Details (indicate cue type and reason): Assist with RLE. VC for proper sequencing and hand placement with head of bed flat. (no rails used) Sitting - Scoot to [place] of Bed: 5: Supervision Sitting - Scoot to [place] of Bed Details (indicate cue type and reason): VC for hand placement and weight shifting continuously to move R hip to EOB Transfers Transfers: Yes Sit to Stand: 4: Min  assist;From bed;From chair/3-in-1;With upper extremity assist;With armrests;3: Mod assist Sit to Stand Details (indicate cue type and reason): VC for hand placement and safety to RW. Mod assist needed from chair(lower surface) and Min assist from 3-1. Pt improved with sit to stand throughout session(3 completed) Stand to Sit: 3: Mod assist;With upper extremity assist;To chair/3-in-1;With armrests Stand to Sit Details: Pt with uncontrolled descent with cueing for proper sequencing and hand placement for a controlled descent. Ambulation/Gait Ambulation/Gait: Yes Ambulation/Gait Assistance: 4: Min assist Ambulation/Gait Assistance Details (indicate cue type and reason): Pt improved ambulation with increased distance. Assist given for steadying. VC for upright posture and sequencing for safety. Ambulation Distance (Feet): 45 Feet Assistive device: Rolling walker Gait Pattern: Step-to pattern;Decreased step length - left;Decreased stance time - right;Decreased hip/knee flexion - right;Trunk flexed Gait velocity: Decreased gait speed Stairs: No (pt has ramp)    Exercise    End of Session PT - End of Session Equipment Utilized During Treatment: Gait belt Activity Tolerance: Patient tolerated treatment well Patient left: in chair;with call bell in reach;with family/visitor present Nurse Communication: Mobility status for transfers;Mobility status for ambulation General Behavior During Session: [facility] for tasks performed Cognition: [facility] for tasks performed  Milana Kidney 12/20/2010, 10:29 AM  12/20/2010 Milana Kidney DPT PAGER: [phone] OFFICE: [phone]

## 2011-01-15 DIAGNOSIS — M25569 Pain in unspecified knee: Secondary | ICD-10-CM | POA: Diagnosis not present

## 2011-01-15 DIAGNOSIS — Z96659 Presence of unspecified artificial knee joint: Secondary | ICD-10-CM | POA: Diagnosis not present

## 2011-01-15 DIAGNOSIS — M25669 Stiffness of unspecified knee, not elsewhere classified: Secondary | ICD-10-CM | POA: Diagnosis not present

## 2011-01-15 DIAGNOSIS — M6281 Muscle weakness (generalized): Secondary | ICD-10-CM | POA: Diagnosis not present

## 2011-01-15 DIAGNOSIS — Z471 Aftercare following joint replacement surgery: Secondary | ICD-10-CM | POA: Diagnosis not present

## 2011-01-15 DIAGNOSIS — R269 Unspecified abnormalities of gait and mobility: Secondary | ICD-10-CM | POA: Diagnosis not present

## 2011-01-15 DIAGNOSIS — IMO0001 Reserved for inherently not codable concepts without codable children: Secondary | ICD-10-CM | POA: Diagnosis not present

## 2011-01-16 DIAGNOSIS — R269 Unspecified abnormalities of gait and mobility: Secondary | ICD-10-CM | POA: Diagnosis not present

## 2011-01-16 DIAGNOSIS — M25569 Pain in unspecified knee: Secondary | ICD-10-CM | POA: Diagnosis not present

## 2011-01-16 DIAGNOSIS — Z471 Aftercare following joint replacement surgery: Secondary | ICD-10-CM | POA: Diagnosis not present

## 2011-01-16 DIAGNOSIS — M6281 Muscle weakness (generalized): Secondary | ICD-10-CM | POA: Diagnosis not present

## 2011-01-16 DIAGNOSIS — IMO0001 Reserved for inherently not codable concepts without codable children: Secondary | ICD-10-CM | POA: Diagnosis not present

## 2011-01-16 DIAGNOSIS — M25669 Stiffness of unspecified knee, not elsewhere classified: Secondary | ICD-10-CM | POA: Diagnosis not present

## 2011-01-20 DIAGNOSIS — IMO0001 Reserved for inherently not codable concepts without codable children: Secondary | ICD-10-CM | POA: Diagnosis not present

## 2011-01-20 DIAGNOSIS — M6281 Muscle weakness (generalized): Secondary | ICD-10-CM | POA: Diagnosis not present

## 2011-01-20 DIAGNOSIS — Z471 Aftercare following joint replacement surgery: Secondary | ICD-10-CM | POA: Diagnosis not present

## 2011-01-20 DIAGNOSIS — M25669 Stiffness of unspecified knee, not elsewhere classified: Secondary | ICD-10-CM | POA: Diagnosis not present

## 2011-01-20 DIAGNOSIS — R269 Unspecified abnormalities of gait and mobility: Secondary | ICD-10-CM | POA: Diagnosis not present

## 2011-01-20 DIAGNOSIS — M25569 Pain in unspecified knee: Secondary | ICD-10-CM | POA: Diagnosis not present

## 2011-01-21 DIAGNOSIS — IMO0001 Reserved for inherently not codable concepts without codable children: Secondary | ICD-10-CM | POA: Diagnosis not present

## 2011-01-21 DIAGNOSIS — M25669 Stiffness of unspecified knee, not elsewhere classified: Secondary | ICD-10-CM | POA: Diagnosis not present

## 2011-01-21 DIAGNOSIS — M25569 Pain in unspecified knee: Secondary | ICD-10-CM | POA: Diagnosis not present

## 2011-01-21 DIAGNOSIS — M6281 Muscle weakness (generalized): Secondary | ICD-10-CM | POA: Diagnosis not present

## 2011-01-21 DIAGNOSIS — R269 Unspecified abnormalities of gait and mobility: Secondary | ICD-10-CM | POA: Diagnosis not present

## 2011-01-21 DIAGNOSIS — Z471 Aftercare following joint replacement surgery: Secondary | ICD-10-CM | POA: Diagnosis not present

## 2011-01-23 DIAGNOSIS — R269 Unspecified abnormalities of gait and mobility: Secondary | ICD-10-CM | POA: Diagnosis not present

## 2011-01-23 DIAGNOSIS — IMO0001 Reserved for inherently not codable concepts without codable children: Secondary | ICD-10-CM | POA: Diagnosis not present

## 2011-01-23 DIAGNOSIS — M25669 Stiffness of unspecified knee, not elsewhere classified: Secondary | ICD-10-CM | POA: Diagnosis not present

## 2011-01-23 DIAGNOSIS — M6281 Muscle weakness (generalized): Secondary | ICD-10-CM | POA: Diagnosis not present

## 2011-01-23 DIAGNOSIS — M25569 Pain in unspecified knee: Secondary | ICD-10-CM | POA: Diagnosis not present

## 2011-01-23 DIAGNOSIS — Z471 Aftercare following joint replacement surgery: Secondary | ICD-10-CM | POA: Diagnosis not present

## 2011-01-27 DIAGNOSIS — Z471 Aftercare following joint replacement surgery: Secondary | ICD-10-CM | POA: Diagnosis not present

## 2011-01-27 DIAGNOSIS — Z79899 Other long term (current) drug therapy: Secondary | ICD-10-CM | POA: Diagnosis not present

## 2011-01-27 DIAGNOSIS — M25669 Stiffness of unspecified knee, not elsewhere classified: Secondary | ICD-10-CM | POA: Diagnosis not present

## 2011-01-27 DIAGNOSIS — IMO0001 Reserved for inherently not codable concepts without codable children: Secondary | ICD-10-CM | POA: Diagnosis not present

## 2011-01-27 DIAGNOSIS — M6281 Muscle weakness (generalized): Secondary | ICD-10-CM | POA: Diagnosis not present

## 2011-01-27 DIAGNOSIS — R269 Unspecified abnormalities of gait and mobility: Secondary | ICD-10-CM | POA: Diagnosis not present

## 2011-01-27 DIAGNOSIS — M069 Rheumatoid arthritis, unspecified: Secondary | ICD-10-CM | POA: Diagnosis not present

## 2011-01-27 DIAGNOSIS — M25569 Pain in unspecified knee: Secondary | ICD-10-CM | POA: Diagnosis not present

## 2011-01-28 DIAGNOSIS — IMO0001 Reserved for inherently not codable concepts without codable children: Secondary | ICD-10-CM | POA: Diagnosis not present

## 2011-01-28 DIAGNOSIS — Z471 Aftercare following joint replacement surgery: Secondary | ICD-10-CM | POA: Diagnosis not present

## 2011-01-28 DIAGNOSIS — R269 Unspecified abnormalities of gait and mobility: Secondary | ICD-10-CM | POA: Diagnosis not present

## 2011-01-28 DIAGNOSIS — M6281 Muscle weakness (generalized): Secondary | ICD-10-CM | POA: Diagnosis not present

## 2011-01-28 DIAGNOSIS — M25569 Pain in unspecified knee: Secondary | ICD-10-CM | POA: Diagnosis not present

## 2011-01-28 DIAGNOSIS — M25669 Stiffness of unspecified knee, not elsewhere classified: Secondary | ICD-10-CM | POA: Diagnosis not present

## 2011-01-30 DIAGNOSIS — R269 Unspecified abnormalities of gait and mobility: Secondary | ICD-10-CM | POA: Diagnosis not present

## 2011-01-30 DIAGNOSIS — IMO0001 Reserved for inherently not codable concepts without codable children: Secondary | ICD-10-CM | POA: Diagnosis not present

## 2011-01-30 DIAGNOSIS — M6281 Muscle weakness (generalized): Secondary | ICD-10-CM | POA: Diagnosis not present

## 2011-01-30 DIAGNOSIS — M25569 Pain in unspecified knee: Secondary | ICD-10-CM | POA: Diagnosis not present

## 2011-01-30 DIAGNOSIS — Z471 Aftercare following joint replacement surgery: Secondary | ICD-10-CM | POA: Diagnosis not present

## 2011-01-30 DIAGNOSIS — M25669 Stiffness of unspecified knee, not elsewhere classified: Secondary | ICD-10-CM | POA: Diagnosis not present

## 2011-02-04 DIAGNOSIS — R269 Unspecified abnormalities of gait and mobility: Secondary | ICD-10-CM | POA: Diagnosis not present

## 2011-02-04 DIAGNOSIS — M25669 Stiffness of unspecified knee, not elsewhere classified: Secondary | ICD-10-CM | POA: Diagnosis not present

## 2011-02-04 DIAGNOSIS — M6281 Muscle weakness (generalized): Secondary | ICD-10-CM | POA: Diagnosis not present

## 2011-02-04 DIAGNOSIS — Z471 Aftercare following joint replacement surgery: Secondary | ICD-10-CM | POA: Diagnosis not present

## 2011-02-04 DIAGNOSIS — M25569 Pain in unspecified knee: Secondary | ICD-10-CM | POA: Diagnosis not present

## 2011-02-04 DIAGNOSIS — IMO0001 Reserved for inherently not codable concepts without codable children: Secondary | ICD-10-CM | POA: Diagnosis not present

## 2011-02-05 DIAGNOSIS — M069 Rheumatoid arthritis, unspecified: Secondary | ICD-10-CM | POA: Diagnosis not present

## 2011-02-05 DIAGNOSIS — M1990 Unspecified osteoarthritis, unspecified site: Secondary | ICD-10-CM | POA: Diagnosis not present

## 2011-02-06 DIAGNOSIS — M25669 Stiffness of unspecified knee, not elsewhere classified: Secondary | ICD-10-CM | POA: Diagnosis not present

## 2011-02-06 DIAGNOSIS — IMO0001 Reserved for inherently not codable concepts without codable children: Secondary | ICD-10-CM | POA: Diagnosis not present

## 2011-02-06 DIAGNOSIS — Z471 Aftercare following joint replacement surgery: Secondary | ICD-10-CM | POA: Diagnosis not present

## 2011-02-06 DIAGNOSIS — R269 Unspecified abnormalities of gait and mobility: Secondary | ICD-10-CM | POA: Diagnosis not present

## 2011-02-06 DIAGNOSIS — M6281 Muscle weakness (generalized): Secondary | ICD-10-CM | POA: Diagnosis not present

## 2011-02-06 DIAGNOSIS — M1710 Unilateral primary osteoarthritis, unspecified knee: Secondary | ICD-10-CM | POA: Diagnosis not present

## 2011-02-06 DIAGNOSIS — M25569 Pain in unspecified knee: Secondary | ICD-10-CM | POA: Diagnosis not present

## 2011-02-10 DIAGNOSIS — IMO0001 Reserved for inherently not codable concepts without codable children: Secondary | ICD-10-CM | POA: Diagnosis not present

## 2011-02-10 DIAGNOSIS — R269 Unspecified abnormalities of gait and mobility: Secondary | ICD-10-CM | POA: Diagnosis not present

## 2011-02-10 DIAGNOSIS — Z471 Aftercare following joint replacement surgery: Secondary | ICD-10-CM | POA: Diagnosis not present

## 2011-02-10 DIAGNOSIS — M6281 Muscle weakness (generalized): Secondary | ICD-10-CM | POA: Diagnosis not present

## 2011-02-10 DIAGNOSIS — M25569 Pain in unspecified knee: Secondary | ICD-10-CM | POA: Diagnosis not present

## 2011-02-10 DIAGNOSIS — M25669 Stiffness of unspecified knee, not elsewhere classified: Secondary | ICD-10-CM | POA: Diagnosis not present

## 2011-02-13 DIAGNOSIS — IMO0001 Reserved for inherently not codable concepts without codable children: Secondary | ICD-10-CM | POA: Diagnosis not present

## 2011-02-13 DIAGNOSIS — M6281 Muscle weakness (generalized): Secondary | ICD-10-CM | POA: Diagnosis not present

## 2011-02-13 DIAGNOSIS — R269 Unspecified abnormalities of gait and mobility: Secondary | ICD-10-CM | POA: Diagnosis not present

## 2011-02-13 DIAGNOSIS — M25569 Pain in unspecified knee: Secondary | ICD-10-CM | POA: Diagnosis not present

## 2011-02-13 DIAGNOSIS — M25669 Stiffness of unspecified knee, not elsewhere classified: Secondary | ICD-10-CM | POA: Diagnosis not present

## 2011-02-13 DIAGNOSIS — Z471 Aftercare following joint replacement surgery: Secondary | ICD-10-CM | POA: Diagnosis not present

## 2011-02-17 DIAGNOSIS — M6281 Muscle weakness (generalized): Secondary | ICD-10-CM | POA: Diagnosis not present

## 2011-02-17 DIAGNOSIS — IMO0001 Reserved for inherently not codable concepts without codable children: Secondary | ICD-10-CM | POA: Diagnosis not present

## 2011-02-17 DIAGNOSIS — Z471 Aftercare following joint replacement surgery: Secondary | ICD-10-CM | POA: Diagnosis not present

## 2011-02-17 DIAGNOSIS — M25669 Stiffness of unspecified knee, not elsewhere classified: Secondary | ICD-10-CM | POA: Diagnosis not present

## 2011-02-17 DIAGNOSIS — Z96659 Presence of unspecified artificial knee joint: Secondary | ICD-10-CM | POA: Diagnosis not present

## 2011-02-17 DIAGNOSIS — R269 Unspecified abnormalities of gait and mobility: Secondary | ICD-10-CM | POA: Diagnosis not present

## 2011-02-17 DIAGNOSIS — M25569 Pain in unspecified knee: Secondary | ICD-10-CM | POA: Diagnosis not present

## 2011-03-03 DIAGNOSIS — M1710 Unilateral primary osteoarthritis, unspecified knee: Secondary | ICD-10-CM | POA: Diagnosis not present

## 2011-03-12 DIAGNOSIS — R21 Rash and other nonspecific skin eruption: Secondary | ICD-10-CM | POA: Diagnosis not present

## 2011-03-17 DIAGNOSIS — Z79899 Other long term (current) drug therapy: Secondary | ICD-10-CM | POA: Diagnosis not present

## 2011-03-17 DIAGNOSIS — M069 Rheumatoid arthritis, unspecified: Secondary | ICD-10-CM | POA: Diagnosis not present

## 2011-04-04 DIAGNOSIS — M25579 Pain in unspecified ankle and joints of unspecified foot: Secondary | ICD-10-CM | POA: Diagnosis not present

## 2011-04-04 DIAGNOSIS — M19079 Primary osteoarthritis, unspecified ankle and foot: Secondary | ICD-10-CM | POA: Diagnosis not present

## 2011-05-05 DIAGNOSIS — M069 Rheumatoid arthritis, unspecified: Secondary | ICD-10-CM | POA: Diagnosis not present

## 2011-06-02 DIAGNOSIS — M1710 Unilateral primary osteoarthritis, unspecified knee: Secondary | ICD-10-CM | POA: Diagnosis not present

## 2011-06-05 DIAGNOSIS — M069 Rheumatoid arthritis, unspecified: Secondary | ICD-10-CM | POA: Diagnosis not present

## 2011-06-05 DIAGNOSIS — R21 Rash and other nonspecific skin eruption: Secondary | ICD-10-CM | POA: Diagnosis not present

## 2011-06-10 DIAGNOSIS — L0390 Cellulitis, unspecified: Secondary | ICD-10-CM | POA: Diagnosis not present

## 2011-06-10 DIAGNOSIS — L0291 Cutaneous abscess, unspecified: Secondary | ICD-10-CM | POA: Diagnosis not present

## 2011-06-11 DIAGNOSIS — B351 Tinea unguium: Secondary | ICD-10-CM | POA: Diagnosis not present

## 2011-06-11 DIAGNOSIS — L259 Unspecified contact dermatitis, unspecified cause: Secondary | ICD-10-CM | POA: Diagnosis not present

## 2011-06-23 DIAGNOSIS — M069 Rheumatoid arthritis, unspecified: Secondary | ICD-10-CM | POA: Diagnosis not present

## 2011-07-07 DIAGNOSIS — M19079 Primary osteoarthritis, unspecified ankle and foot: Secondary | ICD-10-CM | POA: Diagnosis not present

## 2011-07-14 DIAGNOSIS — B359 Dermatophytosis, unspecified: Secondary | ICD-10-CM | POA: Diagnosis not present

## 2011-07-14 DIAGNOSIS — B369 Superficial mycosis, unspecified: Secondary | ICD-10-CM | POA: Diagnosis not present

## 2011-08-21 DIAGNOSIS — M1710 Unilateral primary osteoarthritis, unspecified knee: Secondary | ICD-10-CM | POA: Diagnosis not present

## 2011-08-26 DIAGNOSIS — M76899 Other specified enthesopathies of unspecified lower limb, excluding foot: Secondary | ICD-10-CM | POA: Diagnosis not present

## 2011-08-26 DIAGNOSIS — M25569 Pain in unspecified knee: Secondary | ICD-10-CM | POA: Diagnosis not present

## 2011-08-27 DIAGNOSIS — E782 Mixed hyperlipidemia: Secondary | ICD-10-CM | POA: Diagnosis not present

## 2011-08-27 DIAGNOSIS — Z01810 Encounter for preprocedural cardiovascular examination: Secondary | ICD-10-CM | POA: Diagnosis not present

## 2011-08-27 DIAGNOSIS — R233 Spontaneous ecchymoses: Secondary | ICD-10-CM | POA: Diagnosis not present

## 2011-08-27 DIAGNOSIS — M25579 Pain in unspecified ankle and joints of unspecified foot: Secondary | ICD-10-CM | POA: Diagnosis not present

## 2011-08-27 DIAGNOSIS — E119 Type 2 diabetes mellitus without complications: Secondary | ICD-10-CM | POA: Diagnosis not present

## 2011-08-28 DIAGNOSIS — Z01810 Encounter for preprocedural cardiovascular examination: Secondary | ICD-10-CM | POA: Diagnosis not present

## 2011-09-02 DIAGNOSIS — M76899 Other specified enthesopathies of unspecified lower limb, excluding foot: Secondary | ICD-10-CM | POA: Diagnosis not present

## 2011-09-02 DIAGNOSIS — M25569 Pain in unspecified knee: Secondary | ICD-10-CM | POA: Diagnosis not present

## 2011-09-04 DIAGNOSIS — M069 Rheumatoid arthritis, unspecified: Secondary | ICD-10-CM | POA: Diagnosis not present

## 2011-09-22 ENCOUNTER — Encounter (HOSPITAL_COMMUNITY): Payer: Self-pay | Admitting: Pharmacy Technician

## 2011-09-26 ENCOUNTER — Encounter (HOSPITAL_COMMUNITY): Payer: Self-pay

## 2011-09-26 ENCOUNTER — Encounter (HOSPITAL_COMMUNITY)
Admission: RE | Admit: 2011-09-26 | Discharge: 2011-09-26 | Disposition: A | Discharge status: Home / Self Care | Payer: Medicare Other | Source: Ambulatory Visit | Attending: Orthopedic Surgery | Admitting: Orthopedic Surgery

## 2011-09-26 LAB — BASIC METABOLIC PANEL
BUN: 22 mg/dL (ref 6–23)
CO2: 26 mEq/L (ref 19–32)
Chloride: 102 mEq/L (ref 96–112)
Creatinine, Ser: 1.39 mg/dL — ABNORMAL HIGH (ref 0.50–1.35)
Glucose, Bld: 124 mg/dL — ABNORMAL HIGH (ref 70–99)

## 2011-09-26 LAB — CBC
HCT: 37.7 % — ABNORMAL LOW (ref 39.0–52.0)
MCV: 86.5 fL (ref 78.0–100.0)
RDW: 12.5 % (ref 11.5–15.5)
WBC: 8 10*3/uL (ref 4.0–10.5)

## 2011-09-26 LAB — SURGICAL PCR SCREEN: Staphylococcus aureus: NEGATIVE

## 2011-09-26 NOTE — Pre-Procedure Instructions (Addendum)
20 VERDIE CHAM  09/26/2011   Your procedure is scheduled on:  10/02/11  Report to [facility] at 1015AM.  Call this number if you have problems the morning of surgery: [phone]   Remember:   Do not eat food or drink:After Midnight.    Take these medicines the morning of surgery with A SIP OF WATER: pain med, cardizem (diltiazem), ranitidine, terazosin  STOP plaquenel now    Do not wear jewelry, make-up or nail polish.  Do not wear lotions, powders, or perfumes. You may wear deodorant.  Do not shave 48 hours prior to surgery. Men may shave face and neck.  Do not bring valuables to the hospital.  Contacts, dentures or bridgework may not be worn into surgery.  Leave suitcase in the car. After surgery it may be brought to your room.  For patients admitted to the hospital, checkout time is 11:00 AM the day of discharge.   Patients discharged the day of surgery will not be allowed to drive home.  Name and phone number of your driver:pauline wife [phone]  Special Instructions: CHG Shower Use Special Wash: 1/2 bottle night before surgery and 1/2 bottle morning of surgery.   Please read over the following fact sheets that you were given: Pain Booklet, Coughing and Deep Breathing, MRSA Information and Surgical Site Infection Prevention

## 2011-09-26 NOTE — Progress Notes (Signed)
[facility] req'd ekg, cxr from dr Trula Ore cox  [place] No orders in epic at preadmit. Office called  By Carver Fila times

## 2011-09-29 NOTE — Progress Notes (Signed)
Spoke with Medical Records at [facility] [place] and requested EKG/ CXR be faxed over

## 2011-09-29 NOTE — Progress Notes (Signed)
Spoke with [name] in office about no orders and she stated she was going to send a message about this. She stated she saw where we called on 09/19/11 about this also.

## 2011-10-01 MED ORDER — DEXTROSE 5 % IV SOLN
3.0000 g | INTRAVENOUS | Status: AC
  Administered 2011-10-02: 3 g via INTRAVENOUS
  Filled 2011-10-01: qty 3000

## 2011-10-02 ENCOUNTER — Encounter (HOSPITAL_COMMUNITY): Payer: Self-pay | Admitting: Anesthesiology

## 2011-10-02 ENCOUNTER — Encounter (HOSPITAL_COMMUNITY): Payer: Self-pay | Admitting: *Deleted

## 2011-10-02 ENCOUNTER — Ambulatory Visit (HOSPITAL_COMMUNITY): Payer: Medicare Other

## 2011-10-02 ENCOUNTER — Ambulatory Visit (HOSPITAL_COMMUNITY): Payer: Medicare Other | Admitting: Anesthesiology

## 2011-10-02 ENCOUNTER — Encounter (HOSPITAL_COMMUNITY)
Admission: RE | Disposition: A | Discharge status: Home Health | Payer: Self-pay | Source: Ambulatory Visit | Attending: Orthopedic Surgery

## 2011-10-02 ENCOUNTER — Inpatient Hospital Stay (HOSPITAL_COMMUNITY)
Admission: RE | Admit: 2011-10-02 | Discharge: 2011-10-03 | DRG: 494 | Disposition: A | Discharge status: Home Health | Payer: Medicare Other | Source: Ambulatory Visit | Attending: Orthopedic Surgery | Admitting: Orthopedic Surgery

## 2011-10-02 DIAGNOSIS — I10 Essential (primary) hypertension: Secondary | ICD-10-CM | POA: Diagnosis present

## 2011-10-02 DIAGNOSIS — M19079 Primary osteoarthritis, unspecified ankle and foot: Secondary | ICD-10-CM | POA: Diagnosis not present

## 2011-10-02 DIAGNOSIS — Z87891 Personal history of nicotine dependence: Secondary | ICD-10-CM

## 2011-10-02 DIAGNOSIS — M069 Rheumatoid arthritis, unspecified: Secondary | ICD-10-CM | POA: Diagnosis not present

## 2011-10-02 DIAGNOSIS — Z4789 Encounter for other orthopedic aftercare: Secondary | ICD-10-CM | POA: Diagnosis not present

## 2011-10-02 DIAGNOSIS — G8918 Other acute postprocedural pain: Secondary | ICD-10-CM | POA: Diagnosis not present

## 2011-10-02 LAB — GLUCOSE, CAPILLARY: Glucose-Capillary: 125 mg/dL — ABNORMAL HIGH (ref 70–99)

## 2011-10-02 SURGERY — ANKLE FUSION
Anesthesia: General | Site: Ankle | Laterality: Left | Wound class: Clean

## 2011-10-02 MED ORDER — FAMOTIDINE IN NACL 20-0.9 MG/50ML-% IV SOLN
20.0000 mg | Freq: Two times a day (BID) | INTRAVENOUS | Status: DC
  Administered 2011-10-03: 20 mg via INTRAVENOUS
  Filled 2011-10-02 (×4): qty 50

## 2011-10-02 MED ORDER — INSULIN ASPART 100 UNIT/ML ~~LOC~~ SOLN
0.0000 [IU] | Freq: Three times a day (TID) | SUBCUTANEOUS | Status: DC
  Administered 2011-10-03 (×2): 3 [IU] via SUBCUTANEOUS

## 2011-10-02 MED ORDER — METFORMIN HCL 500 MG PO TABS
1000.0000 mg | ORAL_TABLET | Freq: Two times a day (BID) | ORAL | Status: DC
  Administered 2011-10-03: 1000 mg via ORAL
  Filled 2011-10-02 (×3): qty 2

## 2011-10-02 MED ORDER — DIPHENHYDRAMINE HCL 12.5 MG/5ML PO ELIX: 12.5000 mg | ORAL_SOLUTION | ORAL | Status: DC | PRN

## 2011-10-02 MED ORDER — METHOCARBAMOL 500 MG PO TABS
500.0000 mg | ORAL_TABLET | Freq: Four times a day (QID) | ORAL | Status: DC | PRN
  Administered 2011-10-02: 500 mg via ORAL
  Filled 2011-10-02: qty 1

## 2011-10-02 MED ORDER — LACTATED RINGERS IV SOLN
INTRAVENOUS | Status: DC | PRN
  Administered 2011-10-02 (×2): via INTRAVENOUS

## 2011-10-02 MED ORDER — BACITRACIN ZINC 500 UNIT/GM EX OINT
TOPICAL_OINTMENT | CUTANEOUS | Status: AC
  Filled 2011-10-02: qty 15

## 2011-10-02 MED ORDER — ONDANSETRON HCL 4 MG/2ML IJ SOLN
INTRAMUSCULAR | Status: DC | PRN
  Administered 2011-10-02: 4 mg via INTRAVENOUS

## 2011-10-02 MED ORDER — SODIUM CHLORIDE 0.9 % IV SOLN
INTRAVENOUS | Status: DC
  Administered 2011-10-02: 19:00:00 via INTRAVENOUS

## 2011-10-02 MED ORDER — LACTATED RINGERS IV SOLN
INTRAVENOUS | Status: DC
  Administered 2011-10-02: 12:00:00 via INTRAVENOUS

## 2011-10-02 MED ORDER — LIDOCAINE HCL (CARDIAC) 20 MG/ML IV SOLN
INTRAVENOUS | Status: DC | PRN
  Administered 2011-10-02: 100 mg via INTRAVENOUS

## 2011-10-02 MED ORDER — DILTIAZEM HCL ER COATED BEADS 360 MG PO CP24
360.0000 mg | ORAL_CAPSULE | Freq: Every day | ORAL | Status: DC
  Administered 2011-10-03 (×2): 360 mg via ORAL
  Filled 2011-10-02 (×2): qty 1

## 2011-10-02 MED ORDER — CEFAZOLIN SODIUM-DEXTROSE 2-3 GM-% IV SOLR
2.0000 g | Freq: Four times a day (QID) | INTRAVENOUS | Status: AC
  Administered 2011-10-02 – 2011-10-03 (×3): 2 g via INTRAVENOUS
  Filled 2011-10-02 (×3): qty 50

## 2011-10-02 MED ORDER — METOCLOPRAMIDE HCL 10 MG PO TABS: 5.0000 mg | ORAL_TABLET | Freq: Three times a day (TID) | ORAL | Status: DC | PRN

## 2011-10-02 MED ORDER — FENTANYL CITRATE 0.05 MG/ML IJ SOLN
50.0000 ug | INTRAMUSCULAR | Status: DC | PRN
  Administered 2011-10-02: 100 ug via INTRAVENOUS

## 2011-10-02 MED ORDER — ONDANSETRON HCL 4 MG/2ML IJ SOLN: 4.0000 mg | Freq: Four times a day (QID) | INTRAMUSCULAR | Status: DC | PRN

## 2011-10-02 MED ORDER — ONDANSETRON HCL 4 MG PO TABS: 4.0000 mg | ORAL_TABLET | Freq: Four times a day (QID) | ORAL | Status: DC | PRN

## 2011-10-02 MED ORDER — PROPOFOL 10 MG/ML IV BOLUS
INTRAVENOUS | Status: DC | PRN
  Administered 2011-10-02: 250 mg via INTRAVENOUS

## 2011-10-02 MED ORDER — LISINOPRIL 20 MG PO TABS
20.0000 mg | ORAL_TABLET | Freq: Every day | ORAL | Status: DC
  Administered 2011-10-02 – 2011-10-03 (×2): 20 mg via ORAL
  Filled 2011-10-02 (×2): qty 1

## 2011-10-02 MED ORDER — DEXTROSE 5 % IV SOLN
500.0000 mg | Freq: Four times a day (QID) | INTRAVENOUS | Status: DC | PRN
  Filled 2011-10-02: qty 5

## 2011-10-02 MED ORDER — 0.9 % SODIUM CHLORIDE (POUR BTL) OPTIME
TOPICAL | Status: DC | PRN
  Administered 2011-10-02: 1000 mL

## 2011-10-02 MED ORDER — CHLORHEXIDINE GLUCONATE 4 % EX LIQD: 60.0000 mL | Freq: Once | CUTANEOUS | Status: DC

## 2011-10-02 MED ORDER — FOLIC ACID 1 MG PO TABS
1.0000 mg | ORAL_TABLET | Freq: Every day | ORAL | Status: DC
  Administered 2011-10-02 – 2011-10-03 (×2): 1 mg via ORAL
  Filled 2011-10-02 (×2): qty 1

## 2011-10-02 MED ORDER — DOCUSATE SODIUM 100 MG PO CAPS
100.0000 mg | ORAL_CAPSULE | Freq: Two times a day (BID) | ORAL | Status: DC
  Administered 2011-10-03 (×2): 100 mg via ORAL
  Filled 2011-10-02 (×3): qty 1

## 2011-10-02 MED ORDER — SODIUM CHLORIDE 0.9 % IV SOLN: INTRAVENOUS | Status: DC

## 2011-10-02 MED ORDER — HYDROMORPHONE HCL PF 1 MG/ML IJ SOLN
0.2500 mg | INTRAMUSCULAR | Status: DC | PRN
  Administered 2011-10-02 (×4): 0.5 mg via INTRAVENOUS

## 2011-10-02 MED ORDER — HYDROXYCHLOROQUINE SULFATE 200 MG PO TABS
200.0000 mg | ORAL_TABLET | Freq: Every day | ORAL | Status: DC
  Administered 2011-10-02 – 2011-10-03 (×2): 200 mg via ORAL
  Filled 2011-10-02 (×2): qty 1

## 2011-10-02 MED ORDER — TERAZOSIN HCL 5 MG PO CAPS
5.0000 mg | ORAL_CAPSULE | Freq: Every day | ORAL | Status: DC
  Administered 2011-10-03: 5 mg via ORAL
  Filled 2011-10-02 (×2): qty 1

## 2011-10-02 MED ORDER — FENTANYL CITRATE 0.05 MG/ML IJ SOLN
INTRAMUSCULAR | Status: AC
  Filled 2011-10-02: qty 2

## 2011-10-02 MED ORDER — HYDROMORPHONE HCL PF 1 MG/ML IJ SOLN
0.5000 mg | INTRAMUSCULAR | Status: DC | PRN
  Administered 2011-10-02 – 2011-10-03 (×4): 1 mg via INTRAVENOUS
  Filled 2011-10-02 (×5): qty 1

## 2011-10-02 MED ORDER — ONDANSETRON HCL 4 MG/2ML IJ SOLN: 4.0000 mg | Freq: Once | INTRAMUSCULAR | Status: DC | PRN

## 2011-10-02 MED ORDER — HYDROMORPHONE HCL PF 1 MG/ML IJ SOLN
INTRAMUSCULAR | Status: AC
  Filled 2011-10-02: qty 1

## 2011-10-02 MED ORDER — OXYCODONE HCL 5 MG PO TABS
5.0000 mg | ORAL_TABLET | ORAL | Status: DC | PRN
  Administered 2011-10-02 – 2011-10-03 (×3): 10 mg via ORAL
  Filled 2011-10-02 (×3): qty 2

## 2011-10-02 MED ORDER — METOCLOPRAMIDE HCL 5 MG/ML IJ SOLN: 5.0000 mg | Freq: Three times a day (TID) | INTRAMUSCULAR | Status: DC | PRN

## 2011-10-02 MED ORDER — BACITRACIN ZINC 500 UNIT/GM EX OINT
TOPICAL_OINTMENT | CUTANEOUS | Status: DC | PRN
  Administered 2011-10-02: 1 via TOPICAL

## 2011-10-02 MED ORDER — FENTANYL CITRATE 0.05 MG/ML IJ SOLN
INTRAMUSCULAR | Status: DC | PRN
  Administered 2011-10-02: 25 ug via INTRAVENOUS
  Administered 2011-10-02: 50 ug via INTRAVENOUS

## 2011-10-02 MED ORDER — LOVASTATIN 20 MG PO TABS
20.0000 mg | ORAL_TABLET | Freq: Every day | ORAL | Status: DC
  Administered 2011-10-02: 20 mg via ORAL
  Filled 2011-10-02 (×2): qty 1

## 2011-10-02 MED ORDER — TEMAZEPAM 15 MG PO CAPS
15.0000 mg | ORAL_CAPSULE | Freq: Every day | ORAL | Status: DC
  Administered 2011-10-03: 15 mg via ORAL
  Filled 2011-10-02: qty 1

## 2011-10-02 MED ORDER — SENNA 8.6 MG PO TABS
1.0000 | ORAL_TABLET | Freq: Two times a day (BID) | ORAL | Status: DC
  Administered 2011-10-03 (×2): 8.6 mg via ORAL
  Filled 2011-10-02 (×3): qty 1

## 2011-10-02 MED ORDER — SIMVASTATIN 10 MG PO TABS: 10.0000 mg | ORAL_TABLET | Freq: Every day | ORAL | Status: DC

## 2011-10-02 SURGICAL SUPPLY — 72 items
BANDAGE ESMARK 6X9 LF (GAUZE/BANDAGES/DRESSINGS) ×1 IMPLANT
BIT DRILL 2.5X2.75 QC CALB (BIT) ×1 IMPLANT
BIT DRILL 3.5X5.5 QC CALB (BIT) ×1 IMPLANT
BLADE SAW SGTL HD 18.5X60.5X1. (BLADE) ×1 IMPLANT
BLADE SAW SGTL NAR THIN XSHT (BLADE) ×1 IMPLANT
BLADE SURG 10 STRL SS (BLADE) ×2 IMPLANT
BLADE SURG 15 STRL LF DISP TIS (BLADE) IMPLANT
BLADE SURG 15 STRL SS (BLADE) ×4
BNDG CMPR 9X6 STRL LF SNTH (GAUZE/BANDAGES/DRESSINGS) ×1
BNDG COHESIVE 4X5 TAN STRL (GAUZE/BANDAGES/DRESSINGS) ×2 IMPLANT
BNDG COHESIVE 6X5 TAN STRL LF (GAUZE/BANDAGES/DRESSINGS) ×2 IMPLANT
BNDG ESMARK 6X9 LF (GAUZE/BANDAGES/DRESSINGS) ×2
CHLORAPREP W/TINT 10.5 ML (MISCELLANEOUS) ×1 IMPLANT
CHLORAPREP W/TINT 26ML (MISCELLANEOUS) ×2 IMPLANT
CLOTH BEACON ORANGE TIMEOUT ST (SAFETY) ×2 IMPLANT
COVER SURGICAL LIGHT HANDLE (MISCELLANEOUS) ×2 IMPLANT
CUFF TOURNIQUET SINGLE 34IN LL (TOURNIQUET CUFF) ×2 IMPLANT
CUFF TOURNIQUET SINGLE 44IN (TOURNIQUET CUFF) IMPLANT
DRAPE C-ARM 42X72 X-RAY (DRAPES) ×2 IMPLANT
DRAPE C-ARMOR (DRAPES) ×2 IMPLANT
DRAPE INCISE IOBAN 66X45 STRL (DRAPES) ×2 IMPLANT
DRAPE U-SHAPE 47X51 STRL (DRAPES) ×2 IMPLANT
DRSG ADAPTIC 3X8 NADH LF (GAUZE/BANDAGES/DRESSINGS) ×2 IMPLANT
DRSG PAD ABDOMINAL 8X10 ST (GAUZE/BANDAGES/DRESSINGS) ×2 IMPLANT
DRSG TEGADERM 4X4.75 (GAUZE/BANDAGES/DRESSINGS) ×2 IMPLANT
ELECT REM PT RETURN 9FT ADLT (ELECTROSURGICAL)
ELECTRODE REM PT RTRN 9FT ADLT (ELECTROSURGICAL) ×1 IMPLANT
GAUZE SPONGE 2X2 8PLY STRL LF (GAUZE/BANDAGES/DRESSINGS) ×1 IMPLANT
GLOVE BIO SURGEON STRL SZ8 (GLOVE) ×2 IMPLANT
GLOVE BIOGEL PI IND STRL 8 (GLOVE) ×1 IMPLANT
GLOVE BIOGEL PI INDICATOR 8 (GLOVE) ×1
GOWN PREVENTION PLUS XLARGE (GOWN DISPOSABLE) ×2 IMPLANT
GOWN STRL NON-REIN LRG LVL3 (GOWN DISPOSABLE) ×4 IMPLANT
K-WIRE ACE 1.6X6 (WIRE) ×2
KIT BASIN OR (CUSTOM PROCEDURE TRAY) ×2 IMPLANT
KIT ROOM TURNOVER OR (KITS) ×2 IMPLANT
KWIRE ACE 1.6X6 (WIRE) IMPLANT
MANIFOLD NEPTUNE II (INSTRUMENTS) ×2 IMPLANT
NEEDLE 22X1 1/2 (OR ONLY) (NEEDLE) IMPLANT
NS IRRIG 1000ML POUR BTL (IV SOLUTION) ×2 IMPLANT
PACK ORTHO EXTREMITY (CUSTOM PROCEDURE TRAY) ×2 IMPLANT
PAD ARMBOARD 7.5X6 YLW CONV (MISCELLANEOUS) ×4 IMPLANT
PAD CAST 4YDX4 CTTN HI CHSV (CAST SUPPLIES) ×1 IMPLANT
PADDING CAST COTTON 4X4 STRL (CAST SUPPLIES) ×2
PADDING CAST COTTON 6X4 STRL (CAST SUPPLIES) ×1 IMPLANT
PIN THREADED GUIDE ACE (PIN) ×3 IMPLANT
SCREW ACE CAN 4.0 44M (Screw) ×1 IMPLANT
SCREW CANN 6.5 40MM (Screw) ×2 IMPLANT
SCREW CANN 6.5 50MM (Screw) ×2 IMPLANT
SCREW CANN 6.5 60MM (Screw) ×1 IMPLANT
SCREW CANN 6.5 CUT FLUT 40X22X (Screw) IMPLANT
SCREW CANN 6.5 FLUT 50X22X (Screw) IMPLANT
SCREW CORTICAL 3.5MM  44MM (Screw) ×1 IMPLANT
SCREW CORTICAL 3.5MM 44MM (Screw) IMPLANT
SCREW CORTICAL 3.5MM 50MM (Screw) ×1 IMPLANT
SOAP 2 % CHG 4 OZ (WOUND CARE) ×1 IMPLANT
SPONGE GAUZE 2X2 STER 10/PKG (GAUZE/BANDAGES/DRESSINGS)
SPONGE GAUZE 4X4 12PLY (GAUZE/BANDAGES/DRESSINGS) ×3 IMPLANT
SPONGE LAP 18X18 X RAY DECT (DISPOSABLE) ×2 IMPLANT
STAPLER VISISTAT 35W (STAPLE) IMPLANT
STRIP CLOSURE SKIN 1/2X4 (GAUZE/BANDAGES/DRESSINGS) ×1 IMPLANT
SUCTION FRAZIER TIP 10 FR DISP (SUCTIONS) ×2 IMPLANT
SUT PROLENE 3 0 PS 2 (SUTURE) ×3 IMPLANT
SUT VIC AB 2-0 CT1 27 (SUTURE) ×2
SUT VIC AB 2-0 CT1 TAPERPNT 27 (SUTURE) ×2 IMPLANT
SUT VIC AB 3-0 PS2 18 (SUTURE) ×4
SUT VIC AB 3-0 PS2 18XBRD (SUTURE) ×1 IMPLANT
SYR CONTROL 10ML LL (SYRINGE) IMPLANT
TOWEL OR 17X24 6PK STRL BLUE (TOWEL DISPOSABLE) ×2 IMPLANT
TOWEL OR 17X26 10 PK STRL BLUE (TOWEL DISPOSABLE) ×2 IMPLANT
TUBE CONNECTING 12X1/4 (SUCTIONS) ×2 IMPLANT
WATER STERILE IRR 1000ML POUR (IV SOLUTION) ×1 IMPLANT

## 2011-10-02 NOTE — Anesthesia Postprocedure Evaluation (Signed)
  Anesthesia Post-op Note  Patient: [name]  Procedure(s) Performed: Procedure(s) (LRB) with comments: ANKLE FUSION (Left) - LEFT ANKLE ARTHRODESIS/LEFT HALLUX IPJ ARTHRODESIS  Patient Location: PACU  Anesthesia Type: General  Level of Consciousness: awake, alert , oriented and patient cooperative  Airway and Oxygen Therapy: Patient Spontanous Breathing  Post-op Pain: none  Post-op Assessment: Post-op Vital signs reviewed, Patient's Cardiovascular Status Stable, Respiratory Function Stable, Patent Airway, No signs of Nausea or vomiting and Pain level controlled  Post-op Vital Signs: stable  Complications: No apparent anesthesia complications

## 2011-10-02 NOTE — H&P (Signed)
[name] is an 78 y.o. male.   Chief Complaint: left ankle pain HPI: 78 y/o male with PMH of diabetes c/o left ankle pain for many years.  He has failed treatment with bracing, activity modification, steroid injections and NSAIDs.  He presents now for left ankle arthrodesis.  He also has a h/o left hallux IP joint arthritis and presents for arthrodesis of that joint as well.  Past Medical History  Diagnosis Date  . Hypertension   . Pneumonia   . Diabetes mellitus   . GERD (gastroesophageal reflux disease)   . Arthritis     rheumatoid  . Cataracts, bilateral     Past Surgical History  Procedure Date  . Shoulder surgery     left, done in March 2012  . Rotator cuff repair     right side  . Foot surgery     left foot surgery at 78 year old then again to left foot 1984  . Eye surgery     bilateral cataract surgery lens inplant  . Tonsillectomy   . Total knee arthroplasty 12/16/2010    Procedure: TOTAL KNEE ARTHROPLASTY; rt Surgeon: Raymon Mutton, MD;  Location: [facility] OR;  Service: Orthopedics;  Laterality: Right;    No family history on file. Social History:  reports that he quit smoking about 46 years ago. His smoking use included Cigarettes. He has a 15 pack-year smoking history. He does not have any smokeless tobacco history on file. He reports that he does not use illicit drugs. His alcohol history not on file.  Allergies: No Known Allergies  No prescriptions prior to admission    No results found for this or any previous visit (from the past 48 hour(s)). No results found.  ROS  No recent f/c/n/ wt loss.  There were no vitals taken for this visit. Physical Exam  wn wd male in nad.  A and O x 4.  Mood and affect normal.  EOMI.  Respirations unlabored.  Left ankle with transverse laceration across the anterior surface.  Trophic changes of the skin.  Pulses are 2+ at [facility] and PT.  Effusion is present.  Skin laterally is intact.  Normal sens to LT  plantarly.  Assessment/Plan Left ankle arthritis and hallux IP joint arthritis and malposition - to OR for left ankle and hallux IPJ arthrodeses.  The risks and benefits of the alternative treatment options have been discussed in detail.  The patient wishes to proceed with surgery and specifically understands risks of bleeding, infection, nerve damage, blood clots, need for additional surgery, amputation and death.   [name], [name] 10-09-11, 8:44 AM

## 2011-10-02 NOTE — Anesthesia Procedure Notes (Signed)
Anesthesia Regional Block:  Popliteal block  Pre-Anesthetic Checklist: ,, timeout performed, Correct Patient, Correct Site, Correct Laterality, Correct Procedure, Correct Position, site marked, Risks and benefits discussed,  Surgical consent,  Pre-op evaluation,  At surgeon's request and post-op pain management  Laterality: Left  Prep: Maximum Sterile Barrier Precautions used, chloraprep and alcohol swabs       Needles:   Needle Type: Stimulator Needle - 80        Needle insertion depth: 8 cm   Additional Needles:  Procedures: nerve stimulator Popliteal block  Nerve Stimulator or Paresthesia:  Response: 0.5 mA, 0.1 ms, 8 cm  Additional Responses:   Narrative:  Start time: 10/02/2011 12:15 PM End time: 10/02/2011 12:25 PM Injection made incrementally with aspirations every 5 mL.  Performed by: Personally  Anesthesiologist: Maren Beach MD     Additional Notes: Pt accepts procedure and risks. 30cc ( 25 cc 0.5% Marcaine w/ epi w/o difficulty or discomfort. GES

## 2011-10-02 NOTE — Anesthesia Preprocedure Evaluation (Signed)
Anesthesia Evaluation  Patient identified by MRN, date of birth, ID band Patient awake    Reviewed: Allergy & Precautions, H&P , NPO status , Patient's Chart, lab work & pertinent test results  Airway Mallampati: I TM Distance: >3 FB Neck ROM: full    Dental   Pulmonary          Cardiovascular hypertension, Rhythm:regular Rate:Normal     Neuro/Psych    GI/Hepatic GERD-  ,  Endo/Other  diabetes, Type 2  Renal/GU      Musculoskeletal   Abdominal   Peds  Hematology   Anesthesia Other Findings   Reproductive/Obstetrics                           Anesthesia Physical Anesthesia Plan  ASA: III  Anesthesia Plan: General   Post-op Pain Management:    Induction: Intravenous  Airway Management Planned: LMA  Additional Equipment:   Intra-op Plan:   Post-operative Plan: Extubation in OR  Informed Consent: I have reviewed the patients History and Physical, chart, labs and discussed the procedure including the risks, benefits and alternatives for the proposed anesthesia with the patient or authorized representative who has indicated his/her understanding and acceptance.     Plan Discussed with: CRNA, Anesthesiologist and Surgeon  Anesthesia Plan Comments:         Anesthesia Quick Evaluation

## 2011-10-02 NOTE — Brief Op Note (Signed)
10/02/2011  3:46 PM  PATIENT:  [name]  78 y.o. male  PRE-OPERATIVE DIAGNOSIS:  1. Left Ankle Arthritis 2. Left Hallux IPJ Arthritis  POST-OPERATIVE DIAGNOSIS:  Same  Procedure(s): 1.  Left ankle arthrodesis 2.  Major bone graft from fibula to ankle joint 3.  Fluoro > 1 hour 4.  Left hallux interphalangeal joint arthrodesis  SURGEON:  Toni Arthurs, MD  ASSISTANT: n/a  ANESTHESIA:   General, regional  EBL:  minimal   TOURNIQUET:  2:00 at 225 mm Hg  COMPLICATIONS:  None apparent  DISPOSITION:  Extubated, awake and stable to recovery.  DICTATION ID:  [id number]

## 2011-10-02 NOTE — Transfer of Care (Signed)
Immediate Anesthesia Transfer of Care Note  Patient: [name]  Procedure(s) Performed: Procedure(s) (LRB) with comments: ANKLE FUSION (Left) - LEFT ANKLE ARTHRODESIS/LEFT HALLUX IPJ ARTHRODESIS  Patient Location: PACU  Anesthesia Type: General and Regional  Level of Consciousness: awake, alert  and oriented  Airway & Oxygen Therapy: Patient Spontanous Breathing and Patient connected to nasal cannula oxygen  Post-op Assessment: Report given to PACU RN  Post vital signs: Reviewed and stable  Complications: No apparent anesthesia complications

## 2011-10-02 NOTE — Preoperative (Signed)
Beta Blockers   Reason not to administer Beta Blockers:Not Applicable 

## 2011-10-03 ENCOUNTER — Encounter (HOSPITAL_COMMUNITY): Payer: Self-pay | Admitting: *Deleted

## 2011-10-03 MED ORDER — DSS 100 MG PO CAPS: 100.0000 mg | ORAL_CAPSULE | Freq: Two times a day (BID) | ORAL | Status: DC

## 2011-10-03 MED ORDER — SENNA 8.6 MG PO TABS: 1.0000 | ORAL_TABLET | Freq: Two times a day (BID) | ORAL | Status: DC

## 2011-10-03 MED ORDER — OXYCODONE HCL 5 MG PO TABS: 5.0000 mg | ORAL_TABLET | ORAL | Status: DC | PRN

## 2011-10-03 NOTE — Progress Notes (Signed)
Occupational Therapy Evaluation Patient Details Name: [name] MRN: [id number] DOB: 1930/12/29 Today's Date: 10/03/2011 Time: 1015-1100 OT Time Calculation (min): 45 min  OT Assessment / Plan / Recommendation Clinical Impression  78 yo s/p L ankle surgery secondary to arthritis. NWB. Pt will need HHOT for ADL and functional mobility for ADL. Pt needs w/c for home use secondary to limited mobility with use of RW. Pt will be more independent and safer @ w/c level and allow for healing of L ankle.     OT Assessment  All further OT needs can be met in the next venue of care    Follow Up Recommendations  Home health OT    Barriers to Discharge  none    Equipment Recommendations  Rolling walker with 5" wheels;3 in 1 bedside comode;Wheelchair (measurements);Wheelchair cushion (measurements) (20x22. basic cushion. lightweight w/c)    Recommendations for Other Services  none  Frequency    eval only   Precautions / Restrictions Precautions Precautions: Fall Restrictions Weight Bearing Restrictions: Yes LLE Weight Bearing: Non weight bearing   Pertinent Vitals/Pain No c/o pain    ADL  Toilet Transfer: Performed;Min guard Toilet Transfer Method: Sit to stand;Stand pivot Acupuncturist: Materials engineer and Hygiene: Performed;Min guard Where Assessed - Engineer, mining and Hygiene: Standing Tub/Shower Transfer: Paramedic Method: Science writer: Other (comment) (used 3 in 1) Equipment Used: Gait belt;Reacher;Rolling walker;Sock aid;Long-handled sponge;Long-handled shoe horn Transfers/Ambulation Related to ADLs: min guard ADL Comments: limited by WBS and endurance    OT Diagnosis: Generalized weakness;Acute pain  OT Problem List: Decreased strength;Decreased range of motion;Decreased activity tolerance;Impaired balance (sitting and/or standing);Decreased  safety awareness;Decreased knowledge of use of DME or AE;Decreased knowledge of precautions;Obesity;Pain OT Treatment Interventions:     OT Goals Acute Rehab OT Goals OT Goal Formulation: With patient (eval only)  Visit Information  Last OT Received On: 10/03/11    Subjective Data   I know I will need a w/c   Prior Functioning  Vision/Perception  Home Living Lives With: Spouse Available Help at Discharge: Available 24 hours/day Type of Home: House Home Access: Ramped entrance Home Layout: One level Bathroom Shower/Tub: Walk-in shower;Door [employer]: Standard (has riser) Bathroom Accessibility: Yes How Accessible: Accessible via wheelchair;Accessible via walker Home Adaptive Equipment: Built-in shower seat;Walker - rolling Prior Function Level of Independence: Independent Able to Take Stairs?: Yes Driving: Yes Vocation: Retired Musician: No difficulties Dominant Hand: Left      Cognition  Overall Cognitive Status: Appears within functional limits for tasks assessed/performed Arousal/Alertness: Awake/alert Orientation Level: Appears intact for tasks assessed Behavior During Session: [facility] for tasks performed    Extremity/Trunk Assessment Right Upper Extremity Assessment RUE ROM/Strength/Tone: Deficits Left Upper Extremity Assessment LUE ROM/Strength/Tone: Deficits (hx of ) LUE ROM/Strength/Tone Deficits: TSA Right Lower Extremity Assessment RLE ROM/Strength/Tone: WFL for tasks assessed (hx of TSA) Left Lower Extremity Assessment LLE ROM/Strength/Tone: Deficits Trunk Assessment Trunk Assessment: Normal   Mobility    Transfers Transfers: Sit to Stand;Stand to Sit Sit to Stand: 4: Min guard;With upper extremity assist;From chair/3-in-1;From toilet Stand to Sit: 4: Min guard;With upper extremity assist;To chair/3-in-1 Details for Transfer Assistance: limited by weakness and poor endurnace       Exercise     Balance  Min A with  dynamic task @ times.   End of Session OT - End of Session Equipment Utilized During Treatment: Gait belt Activity Tolerance: Patient limited by fatigue Patient left: in chair;with  call bell/phone within reach;with family/visitor present Nurse Communication: Other (comment) (equipment a nd D/C needs)  GO     Shrihaan Porzio,HILLARY 10/03/2011, 12:03 PM [facility], OTR/L  [phone] 10/03/2011

## 2011-10-03 NOTE — Plan of Care (Signed)
Problem: Phase II Progression Outcomes Goal: Vital signs stable Outcome: Completed/Met Date Met:  10/03/11 98/42.  Pt asymptomatic of low bp.   Goal: Surgical site without signs of infection Outcome: Adequate for Discharge Surgical site covered.  Goal: Sutures/staples intact Outcome: Adequate for Discharge Surgical site covered.  Problem: Phase III Progression Outcomes Goal: IV changed to normal saline lock Outcome: Adequate for Discharge Iv dc'd

## 2011-10-03 NOTE — Evaluation (Signed)
Physical Therapy Evaluation Patient Details Name: [name] MRN: [id number] DOB: 06-23-1930 Today's Date: 10/03/2011 Time: 4098-1191 PT Time Calculation (min): 35 min  PT Assessment / Plan / Recommendation Clinical Impression  Pt is a pleasent and cooperative 78 y.o. male who presents with deficits in functional mobility and safety secondary to pain, weakness, decreased activity tolerance, NWBing restriction and precautions.  Attempted mobility using rw. Patient had difficulty advancing while maintaining NWBing.  Do not feel patient will be able to ambulate safely utilizing rw, recommend w/c to ensure patient safety and NWBing restrictions.  Patient's home is equipped with ramp.  Rec continued home PT evaluation upon discharge for follow-up.    PT Assessment  Patient needs continued PT services    Follow Up Recommendations  Home health PT    Barriers to Discharge        Equipment Recommendations  Rolling walker with 5" wheels;3 in 1 bedside comode;Wheelchair (measurements);Wheelchair cushion (measurements) (20x22. basic cushion. lightweight w/c)    Recommendations for Other Services OT consult   Frequency Min 5X/week    Precautions / Restrictions Precautions Precautions: Fall Restrictions Weight Bearing Restrictions: Yes LLE Weight Bearing: Non weight bearing   Pertinent Vitals/Pain 4/10      Mobility  Bed Mobility Bed Mobility: Supine to Sit;Sitting - Scoot to Edge of Bed Supine to Sit: 4: Min assist Sitting - Scoot to [place] of Bed: 4: Min assist Details for Bed Mobility Assistance: Assist to LLE Transfers Sit to Stand: 4: Min guard;With upper extremity assist;From chair/3-in-1;From toilet Stand to Sit: 4: Min guard;With upper extremity assist;To chair/3-in-1 Details for Transfer Assistance: limited by weakness and poor endurnace Ambulation/Gait Ambulation/Gait Assistance: 4: Min assist Ambulation Distance (Feet): 12 Feet Assistive device: Rolling  walker Ambulation/Gait Assistance Details: Min assist secondary to UE weakness and poor endurance. VCs for NWBing and sequencing Gait Pattern: Step-to pattern Gait velocity: decreased General Gait Details: Pt required assist multiple times secondary to weakness. Pt also continued to land traumatically on heel of uninvolved extremity secondary to inability to maintain constant support using rw.  Questionable safety with rw under current restrictions.  Stairs: No    Exercises     PT Diagnosis: Difficulty walking;Abnormality of gait;Generalized weakness;Acute pain  PT Problem List: Decreased strength;Decreased range of motion;Decreased activity tolerance;Decreased balance;Decreased mobility;Pain PT Treatment Interventions: Gait training;DME instruction;Functional mobility training;Therapeutic activities;Therapeutic exercise;Wheelchair mobility training;Patient/family education   PT Goals Acute Rehab PT Goals PT Goal Formulation: With patient/family Time For Goal Achievement: 10/08/11 Potential to Achieve Goals: Good Pt will go Supine/Side to Sit: with supervision PT Goal: Supine/Side to Sit - Progress: Goal set today Pt will Sit at Edge of Bed: with supervision PT Goal: Sit at Edge Of Bed - Progress: Goal set today Pt will Stand: with supervision PT Goal: Stand - Progress: Goal set today Pt will Ambulate: 1 - 15 feet;with supervision;with rolling walker PT Goal: Ambulate - Progress: Goal set today  Visit Information  Last PT Received On: 10/03/11 Assistance Needed: +1    Subjective Data  Subjective: I've got the hiccups but the pain isn't too bad Patient Stated Goal: to go home today   Prior Functioning  Home Living Lives With: Spouse Available Help at Discharge: Available 24 hours/day Type of Home: House Home Access: Ramped entrance Home Layout: One level Bathroom Shower/Tub: Walk-in shower;Door Bathroom Toilet: Standard (has riser) Bathroom Accessibility: Yes How  Accessible: Accessible via wheelchair;Accessible via walker Home Adaptive Equipment: Built-in shower seat;Walker - rolling Prior Function Level of Independence: Independent Able  to Take Stairs?: Yes Driving: Yes Vocation: Retired Musician: No difficulties Dominant Hand: Left    Cognition  Overall Cognitive Status: Appears within functional limits for tasks assessed/performed Arousal/Alertness: Awake/alert Orientation Level: Appears intact for tasks assessed Behavior During Session: [facility] for tasks performed    Extremity/Trunk Assessment Right Upper Extremity Assessment RUE ROM/Strength/Tone: Deficits Left Upper Extremity Assessment LUE ROM/Strength/Tone: Deficits (hx of ) LUE ROM/Strength/Tone Deficits: TSA Right Lower Extremity Assessment RLE ROM/Strength/Tone: WFL for tasks assessed (hx of TSA) Left Lower Extremity Assessment LLE ROM/Strength/Tone: Deficits Trunk Assessment Trunk Assessment: Normal   Balance    End of Session PT - End of Session Equipment Utilized During Treatment: Gait belt Activity Tolerance: Patient limited by pain;Patient limited by fatigue Patient left: in chair;with call bell/phone within reach;with family/visitor present Nurse Communication: Mobility status;Other (comment) (need for wc upon discharge for safety with mobilization)  GP     Fabio Asa 10/03/2011, 12:23 PM Charlotte Crumb, PT DPT  [phone]

## 2011-10-03 NOTE — Op Note (Signed)
NAME:  [name], [name] NO.:  [id number]  MEDICAL RECORD NO.:  [id number]  LOCATION:  [id number]                        FACILITY:  [facility]  PHYSICIAN:  Toni Arthurs, MD        DATE OF BIRTH:  Jul 24, 1930  DATE OF PROCEDURE:  10/02/2011 DATE OF DISCHARGE:                              OPERATIVE REPORT   PREOPERATIVE DIAGNOSES: 1. Left ankle arthritis. 2. Left hallux interphalangeal joint arthritis.  POSTOPERATIVE DIAGNOSES: 1. Left ankle arthritis. 2. Left hallux interphalangeal joint arthritis.  PROCEDURE: 1. Left ankle arthrodesis. 2. Major bone graft from the fibula to the ankle joint. 3. Intraoperative interpretation of fluoroscopic imaging greater than     1 hour. 4. Left hallux interphalangeal joint arthrodesis.  SURGEON:  Toni Arthurs, MD  ANESTHESIA:  General, regional.  ESTIMATED BLOOD LOSS:  Minimal.  TOURNIQUET TIME:  120 minutes at 225 mmHg.  COMPLICATIONS:  None apparent.  DISPOSITION:  Extubated awake and stable to recovery.  INDICATION FOR PROCEDURE:  The patient is a 78 year old male with past medical history significant for an anterior ankle joint laceration as a child.  He has had several procedures to the ankle joint over the years leading to compromised anterior ankle joint, skin as well as ankle arthritis.  He also has a severe flexion contracture of the hallux at the IP joint with resultant arthritis.  He has failed treatment with activity modification, bracing, shoe wear modification, and oral pain medicines.  He desires surgical treatment.  He is presenting today for ankle arthrodesis as well as correction of his flexion contracture with IP joint arthrodesis of the left hallux.  He understands risks and benefits, the alternative treatment options, and elects surgical treatment.  He specifically understands risks of bleeding, infection, nerve damage, blood clots, need for additional surgery, amputation, and death.  PROCEDURE IN  DETAIL:  After preoperative consent was obtained, the correct operative sites were identified.  The patient was brought to the operating room and placed supine on the operating table.  General anesthesia was induced.  Preoperative antibiotics were administered. Surgical time-out was taken.  The left lower extremity was prepped and draped in standard sterile fashion with tourniquet around the thigh. The extremity was exsanguinated.  The tourniquet was inflated to 225 mmHg.  A longitudinal incision was marked over the fibula.  The incision was made.  Sharp dissection was carried down through the skin and subcutaneous tissue to the level of the bone.  The periosteum was incised.  The fibula was dissected anteriorly and distally.  A saw was then used to cut through the fibula several cm proximal from the tibial plafond.  A small wafer of bone was removed in its entirety.  The fibula was then hinged on a posterior flap of soft tissue.  The medial half of the fibula was resected with a sagittal plane cut.  The medial half was then morselized on the back table as major bone graft.  The ankle joint was exposed.  A lamina spreader was used to open it.  It was noted to have severe arthritis with nearly full-thickness loss of cartilage over at least 3/4 of the joint.  The  remaining cartilage was removed with curette and rongeur.  Once all the cartilage was removed, wound was irrigated copiously, removing all small bits of cartilage.  A 3.5 mm drill bit was then used to perforate the subchondral bone over the tibial and talar sides of the joint.  The resultant bone graft was left in the joint.  The quarter-inch osteotome was used to break up the remaining subchondral bone between the drill holes, creating an exposed fusion bed.  The bone graft from the morselized fibula was then packed in the joint as well.  The ankle joint was then reduced.  Guidewires for the 6.5 mm Biomet cannulated screws were  then advanced across the ankle joint.  Three screws were placed, one was posterior lateral, one was anteromedial, and one was anterolateral.  The appropriate position of the guide pins were verified on AP and lateral fluoroscopic views. Cannulated screws were then placed over the 3 guide pins and were all noted to have appropriate purchase.  A washer was required on anteromedial guide pin due to the soft metaphyseal bone.  The guide pins were all removed.  The remaining fragment of the fibula was reduced to the lateral side of the ankle joint.  It was fixed with a 3.5 mm screw proximally and a 3.5 mm screw distally.  The remaining bone graft was then packed in the interval between the fibular strut graft and the tibiotalar joint.  Wound was irrigated copiously.  The subcutaneous tissue as well as periosteum were approximated over the lateral side of the joint with figure-of-eight sutures of 2-0 Vicryl.  Subcutaneous tissue was approximated with inverted simple sutures of 3-0 Monocryl.  A running 3-0 Prolene was used to close the skin incision.  The stab incisions were closed with horizontal mattress sutures of 3-0 Prolene.  Attention was then turned to the hallux.  A transverse incision was made over the IP joint dorsally.  The collateral ligaments were released exposing the head of the proximal phalanx.  An oscillating saw was used to resect the subchondral bone.  The base of the distal phalanx was then exposed and again an oscillating saw was used to resect the articular cartilage to the level of subchondral bone.  The joint was reduced.  A 1.6 mm K-wire was inserted from the tip of the toe across the reduced IP joint.  AP and lateral fluoroscopic views showed appropriate reduction of the joint and appropriate position of the guide pin.  A 4 mm partially threaded cannulated screw was then inserted through a stab incision at the tip of the toe.  The joint was noted to  compress appropriately and be stable.  A guide pin was reduced.  Final AP and lateral views of the ankle and AP and lateral views of the hallux were obtained showing appropriate position length of all hardware and appropriate reduction of both the ankle and the hallux IP joints.  The hallux wound was then irrigated copiously.  The extensor tendon and subcutaneous tissues were approximated with 3-0 Monocryl simple sutures. The skin incision was closed with horizontal mattress 3-0 Prolene suture.  The tip of the toe was closed in similar fashion.  Sterile dressings were applied followed by a well-padded short-leg splint.  The tourniquet was released at 2 hours with application of the dressings. The patient was awakened from anesthesia and transported to recovery room in stable condition.  FOLLOWUP PLAN:  The patient will be observed overnight for pain control. He will follow up with  me in 2 weeks for suture removal and conversion to a cast.     Toni Arthurs, MD     JH/MEDQ  D:  10/02/2011  T:  10/03/2011  Job:  [id number]

## 2011-10-03 NOTE — Care Management Note (Signed)
    Page 1 of 2   10/03/2011     11:57:24 AM   CARE MANAGEMENT NOTE 10/03/2011  Patient:  [name], [name]   Account Number:  [id number]  Date Initiated:  10/03/2011  Documentation initiated by:  Anette Guarneri  Subjective/Objective Assessment:   POD#1 s/p left ankle fusion  HH service needs and DME     Action/Plan:   Home with [facility] services   Anticipated DC Date:  10/03/2011   Anticipated DC Plan:  HOME W HOME HEALTH SERVICES      DC Planning Services  CM consult      [facility] Choice  DURABLE MEDICAL EQUIPMENT  HOME HEALTH   Choice offered to / List presented to:  C-1 Patient   DME arranged  3-N-1  WHEELCHAIR - MANUAL      DME agency  [facility]     HH arranged  HH-2 PT  HH-3 OT      [facility] agency  [facility]   Status of service:  Completed, signed off Medicare Important Message given?   (If response is "NO", the following Medicare IM given date fields will be blank) Date Medicare IM given:   Date Additional Medicare IM given:    Discharge Disposition:  HOME W HOME HEALTH SERVICES  Per UR Regulation:  Reviewed for med. necessity/level of care/duration of stay  If discussed at Long Length of Stay Meetings, dates discussed:    Comments:  10/03/11  11:54 Anette Guarneri RN/CM HHPT/OT ordered, spoke with patient, per Mr. Kennemer he has used [facility] in past and would like to use them this admission when d/cd [facility] with elevated, removable arms and legs and w/c cushion as well as 3n1 ordered Contacted [facility] regarding HH services, HHPT/OT to be started after discharge WC/cushion and 3n1 to be delivered by [facility] to room prior to discharge.

## 2011-10-03 NOTE — Discharge Summary (Signed)
Physician Discharge Summary  Patient ID: [name] MRN: [id number] DOB/AGE: 01-22-1930 78 y.o.  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  [name] tolerated the procedure well and was discharged to home after PT the following day.  His hospital course was otherwise unremarkable.  Consults: None  Significant Diagnostic Studies: none  Treatments: therapies: PT  Discharge Exam: Blood pressure 124/60, pulse 94, temperature 98.7 F (37.1 C), temperature source Oral, resp. rate 18, SpO2 99.00%. wn wd male in nad.  A and O x 4.  Mood and affect normal.  EOMI.  Respirations unlabored.  L ankle splinted.  Brisk cap refill at toes.  Active PF adn DF.  Disposition: to home  Discharge Orders    Future Orders Please Complete By Expires   Diet - low sodium heart healthy      Call MD / Call 911      Comments:   If you experience chest pain or shortness of breath, CALL 911 and be transported to the hospital emergency room.  If you develope a fever above 101 F, pus (white drainage) or increased drainage or redness at the wound, or calf pain, call your surgeon's office.   Constipation Prevention      Comments:   Drink plenty of fluids.  Prune juice may be helpful.  You may use a stool softener, such as Colace (over the counter) 100 mg twice a day.  Use MiraLax (over the counter) for constipation as needed.   Increase activity slowly as tolerated      Non weight bearing          Medication List     As of 10/03/2011  7:35 AM    STOP taking these medications         HYDROcodone-acetaminophen 5-500 MG per tablet   Commonly known as: VICODIN      inFLIXimab 100 MG injection     Commonly known as: REMICADE      TAKE these medications         diltiazem 360 MG 24 hr capsule   Commonly known as: CARDIZEM CD   Take 360 mg by mouth daily.      DSS 100 MG Caps   Take 100 mg by mouth 2 (two) times daily.      folic acid 1 MG tablet   Commonly known as: FOLVITE   Take 1 mg by mouth daily.      lisinopril 20 MG tablet   Commonly known as: PRINIVIL,ZESTRIL   Take 20 mg by mouth daily.      lovastatin 20 MG tablet   Commonly known as: MEVACOR   Take 20 mg by mouth at bedtime.      metFORMIN 1000 MG tablet   Commonly known as: GLUCOPHAGE   Take 1,000 mg by mouth 2 (two) times daily with a meal.      oxyCODONE 5 MG immediate release tablet   Commonly known as: Oxy IR/ROXICODONE   Take 1-2 tablets (5-10 mg total) by mouth every 4 (four) hours as needed for pain.      PLAQUENIL 200 MG tablet   Generic drug: hydroxychloroquine   Take 200 mg by mouth daily.  ranitidine 150 MG tablet   Commonly known as: ZANTAC   Take 150 mg by mouth 2 (two) times daily.      senna 8.6 MG Tabs   Commonly known as: SENOKOT   Take 1 tablet (8.6 mg total) by mouth 2 (two) times daily.      temazepam 15 MG capsule   Commonly known as: RESTORIL   Take 15 mg by mouth at bedtime.      terazosin 5 MG capsule   Commonly known as: HYTRIN   Take 5 mg by mouth at bedtime.           Follow-up Information    Follow up with Isadore Bokhari, Jonny Ruiz, MD. Schedule an appointment as soon as possible for a visit in 2 weeks.   Contact information:   [address] [place] [address] [phone]          Signed: SINCERE, GAYE 10/03/2011, 7:35 AM

## 2011-10-03 NOTE — Progress Notes (Signed)
DC instructions given to pt at this time re:  F/u appts, activity restrictions, diet, new meds, continued home meds, s/s of problems to report to md, and community resources.  Pt and family verbalized understanding of all instructions.  No s/s of any acute distress noted.  Medicated for pain prior to leaving to prevent pain from travel.

## 2011-10-05 DIAGNOSIS — Z4789 Encounter for other orthopedic aftercare: Secondary | ICD-10-CM | POA: Diagnosis not present

## 2011-10-05 DIAGNOSIS — I10 Essential (primary) hypertension: Secondary | ICD-10-CM | POA: Diagnosis not present

## 2011-10-05 DIAGNOSIS — IMO0001 Reserved for inherently not codable concepts without codable children: Secondary | ICD-10-CM | POA: Diagnosis not present

## 2011-10-05 DIAGNOSIS — E119 Type 2 diabetes mellitus without complications: Secondary | ICD-10-CM | POA: Diagnosis not present

## 2011-10-05 DIAGNOSIS — M069 Rheumatoid arthritis, unspecified: Secondary | ICD-10-CM | POA: Diagnosis not present

## 2011-10-05 DIAGNOSIS — R269 Unspecified abnormalities of gait and mobility: Secondary | ICD-10-CM | POA: Diagnosis not present

## 2011-10-07 DIAGNOSIS — M069 Rheumatoid arthritis, unspecified: Secondary | ICD-10-CM | POA: Diagnosis not present

## 2011-10-07 DIAGNOSIS — I10 Essential (primary) hypertension: Secondary | ICD-10-CM | POA: Diagnosis not present

## 2011-10-07 DIAGNOSIS — Z4789 Encounter for other orthopedic aftercare: Secondary | ICD-10-CM | POA: Diagnosis not present

## 2011-10-07 DIAGNOSIS — IMO0001 Reserved for inherently not codable concepts without codable children: Secondary | ICD-10-CM | POA: Diagnosis not present

## 2011-10-07 DIAGNOSIS — E119 Type 2 diabetes mellitus without complications: Secondary | ICD-10-CM | POA: Diagnosis not present

## 2011-10-07 DIAGNOSIS — R269 Unspecified abnormalities of gait and mobility: Secondary | ICD-10-CM | POA: Diagnosis not present

## 2011-10-09 DIAGNOSIS — IMO0001 Reserved for inherently not codable concepts without codable children: Secondary | ICD-10-CM | POA: Diagnosis not present

## 2011-10-09 DIAGNOSIS — R269 Unspecified abnormalities of gait and mobility: Secondary | ICD-10-CM | POA: Diagnosis not present

## 2011-10-09 DIAGNOSIS — Z4789 Encounter for other orthopedic aftercare: Secondary | ICD-10-CM | POA: Diagnosis not present

## 2011-10-09 DIAGNOSIS — I10 Essential (primary) hypertension: Secondary | ICD-10-CM | POA: Diagnosis not present

## 2011-10-09 DIAGNOSIS — E119 Type 2 diabetes mellitus without complications: Secondary | ICD-10-CM | POA: Diagnosis not present

## 2011-10-09 DIAGNOSIS — M069 Rheumatoid arthritis, unspecified: Secondary | ICD-10-CM | POA: Diagnosis not present

## 2011-10-13 DIAGNOSIS — M069 Rheumatoid arthritis, unspecified: Secondary | ICD-10-CM | POA: Diagnosis not present

## 2011-10-13 DIAGNOSIS — I10 Essential (primary) hypertension: Secondary | ICD-10-CM | POA: Diagnosis not present

## 2011-10-13 DIAGNOSIS — E119 Type 2 diabetes mellitus without complications: Secondary | ICD-10-CM | POA: Diagnosis not present

## 2011-10-13 DIAGNOSIS — R269 Unspecified abnormalities of gait and mobility: Secondary | ICD-10-CM | POA: Diagnosis not present

## 2011-10-13 DIAGNOSIS — IMO0001 Reserved for inherently not codable concepts without codable children: Secondary | ICD-10-CM | POA: Diagnosis not present

## 2011-10-13 DIAGNOSIS — Z4789 Encounter for other orthopedic aftercare: Secondary | ICD-10-CM | POA: Diagnosis not present

## 2011-10-15 DIAGNOSIS — Z4789 Encounter for other orthopedic aftercare: Secondary | ICD-10-CM | POA: Diagnosis not present

## 2011-10-15 DIAGNOSIS — M069 Rheumatoid arthritis, unspecified: Secondary | ICD-10-CM | POA: Diagnosis not present

## 2011-10-15 DIAGNOSIS — IMO0001 Reserved for inherently not codable concepts without codable children: Secondary | ICD-10-CM | POA: Diagnosis not present

## 2011-10-15 DIAGNOSIS — E119 Type 2 diabetes mellitus without complications: Secondary | ICD-10-CM | POA: Diagnosis not present

## 2011-10-15 DIAGNOSIS — I10 Essential (primary) hypertension: Secondary | ICD-10-CM | POA: Diagnosis not present

## 2011-10-15 DIAGNOSIS — R269 Unspecified abnormalities of gait and mobility: Secondary | ICD-10-CM | POA: Diagnosis not present

## 2011-10-17 DIAGNOSIS — E119 Type 2 diabetes mellitus without complications: Secondary | ICD-10-CM | POA: Diagnosis not present

## 2011-10-17 DIAGNOSIS — M19079 Primary osteoarthritis, unspecified ankle and foot: Secondary | ICD-10-CM | POA: Diagnosis not present

## 2011-10-17 DIAGNOSIS — Z4789 Encounter for other orthopedic aftercare: Secondary | ICD-10-CM | POA: Diagnosis not present

## 2011-10-17 DIAGNOSIS — I10 Essential (primary) hypertension: Secondary | ICD-10-CM | POA: Diagnosis not present

## 2011-10-17 DIAGNOSIS — M069 Rheumatoid arthritis, unspecified: Secondary | ICD-10-CM | POA: Diagnosis not present

## 2011-10-17 DIAGNOSIS — R269 Unspecified abnormalities of gait and mobility: Secondary | ICD-10-CM | POA: Diagnosis not present

## 2011-10-17 DIAGNOSIS — IMO0001 Reserved for inherently not codable concepts without codable children: Secondary | ICD-10-CM | POA: Diagnosis not present

## 2011-11-06 DIAGNOSIS — M069 Rheumatoid arthritis, unspecified: Secondary | ICD-10-CM | POA: Diagnosis not present

## 2011-11-17 DIAGNOSIS — M19079 Primary osteoarthritis, unspecified ankle and foot: Secondary | ICD-10-CM | POA: Diagnosis not present

## 2011-11-20 DIAGNOSIS — M1710 Unilateral primary osteoarthritis, unspecified knee: Secondary | ICD-10-CM | POA: Diagnosis not present

## 2011-11-30 ENCOUNTER — Emergency Department (HOSPITAL_COMMUNITY): Payer: Medicare Other

## 2011-11-30 ENCOUNTER — Encounter (HOSPITAL_COMMUNITY): Payer: Self-pay | Admitting: Emergency Medicine

## 2011-11-30 ENCOUNTER — Emergency Department (HOSPITAL_COMMUNITY)
Admission: EM | Admit: 2011-11-30 | Discharge: 2011-11-30 | Disposition: A | Discharge status: Home / Self Care | Payer: Medicare Other | Attending: Emergency Medicine | Admitting: Emergency Medicine

## 2011-11-30 DIAGNOSIS — Z8739 Personal history of other diseases of the musculoskeletal system and connective tissue: Secondary | ICD-10-CM | POA: Insufficient documentation

## 2011-11-30 DIAGNOSIS — I10 Essential (primary) hypertension: Secondary | ICD-10-CM | POA: Diagnosis not present

## 2011-11-30 DIAGNOSIS — M25579 Pain in unspecified ankle and joints of unspecified foot: Secondary | ICD-10-CM | POA: Diagnosis not present

## 2011-11-30 DIAGNOSIS — Z981 Arthrodesis status: Secondary | ICD-10-CM | POA: Insufficient documentation

## 2011-11-30 DIAGNOSIS — Z87891 Personal history of nicotine dependence: Secondary | ICD-10-CM | POA: Insufficient documentation

## 2011-11-30 DIAGNOSIS — Z961 Presence of intraocular lens: Secondary | ICD-10-CM | POA: Insufficient documentation

## 2011-11-30 DIAGNOSIS — Z9849 Cataract extraction status, unspecified eye: Secondary | ICD-10-CM | POA: Insufficient documentation

## 2011-11-30 DIAGNOSIS — K219 Gastro-esophageal reflux disease without esophagitis: Secondary | ICD-10-CM | POA: Diagnosis not present

## 2011-11-30 DIAGNOSIS — M7989 Other specified soft tissue disorders: Secondary | ICD-10-CM | POA: Diagnosis not present

## 2011-11-30 DIAGNOSIS — M79609 Pain in unspecified limb: Secondary | ICD-10-CM | POA: Diagnosis not present

## 2011-11-30 DIAGNOSIS — Z96659 Presence of unspecified artificial knee joint: Secondary | ICD-10-CM | POA: Diagnosis not present

## 2011-11-30 DIAGNOSIS — Z8701 Personal history of pneumonia (recurrent): Secondary | ICD-10-CM | POA: Diagnosis not present

## 2011-11-30 DIAGNOSIS — E119 Type 2 diabetes mellitus without complications: Secondary | ICD-10-CM | POA: Diagnosis not present

## 2011-11-30 MED ORDER — GABAPENTIN 300 MG PO CAPS: 300.0000 mg | ORAL_CAPSULE | Freq: Three times a day (TID) | ORAL | Status: DC

## 2011-11-30 NOTE — ED Notes (Signed)
Pt reports pain and swelling to left foot and ankle onset 3 days ago. Pt describes pain as burning sensation. Pt had ankle fusion done 10/02/2011. Pt presents with cam walker on affected foot.

## 2011-11-30 NOTE — ED Notes (Signed)
Patient discharged with instructions using the teach back method. Patient verbalizes an understanding. 

## 2011-11-30 NOTE — ED Notes (Signed)
Back from X-ray , Venous Duplex now in progress

## 2011-11-30 NOTE — Progress Notes (Signed)
78 yo man had fusion of the left ankle on 10/02/2011.  Now he has prominent swelling.  Exam shows 3+ swelling of left foot and ankle, extending 1/3 of way to left knee.  Venous dopplers showed no DVT.  Awaiting results of x-rays of the left foot and ankle. X-rays were negative.  Advised followup with Dr. Victorino Dike, his surgeon.

## 2011-11-30 NOTE — ED Notes (Signed)
Patient transported to X-ray 

## 2011-11-30 NOTE — ED Provider Notes (Signed)
History     CSN: [id number]  Arrival date & time 11/30/11  1122   First MD Initiated Contact with Patient 11/30/11 1201      Chief Complaint  Patient presents with  . Ankle Pain    Left foot    (Consider location/radiation/quality/duration/timing/severity/associated sxs/prior treatment) HPI  Patient presents to the emergency department with complaints of pain and swelling with burning to the left foot and ankle for 3 days. In September he had an ankle fusion and since then he has been in a Personal assistant or wheelchair. He denies any wounds or color change in the foot. He is a diabetic. He denies his foot feeling cold or being unable to move his foot less than his baseline. He denies an injury or fall causing the symptoms. His wife is with him and agrees with his story. nad vss  Past Medical History  Diagnosis Date  . Hypertension   . Pneumonia   . Diabetes mellitus   . GERD (gastroesophageal reflux disease)   . Arthritis     rheumatoid  . Cataracts, bilateral     Past Surgical History  Procedure Date  . Shoulder surgery     left, done in March 2012  . Rotator cuff repair     right side  . Foot surgery     left foot surgery at 78 year old then again to left foot 1984  . Eye surgery     bilateral cataract surgery lens inplant  . Tonsillectomy   . Total knee arthroplasty 12/16/2010    Procedure: TOTAL KNEE ARTHROPLASTY; rt Surgeon: Raymon Mutton, MD;  Location: [facility] OR;  Service: Orthopedics;  Laterality: Right;  . Ankle fusion left    No family history on file.  History  Substance Use Topics  . Smoking status: Former Smoker -- 1.0 packs/day for 15 years    Types: Cigarettes    Quit date: 09/25/1965  . Smokeless tobacco: Not on file     Comment: quit smoking in 1967  . Alcohol Use: Yes     Comment: ocassional wine, beer or liquor      Review of Systems   Review of Systems  Gen: no weight loss, fevers, chills, night sweats  Neck: no neck pain  Lungs:No  wheezing, coughing or hemoptysis CV: no chest pain, palpitations, dependent edema or orthopnea  Abd: no abdominal pain, nausea, vomiting  GU: no dysuria or gross hematuria  MSK:  Left foot pain and swelling  Neuro: no headache, no focal neurologic deficits  Skin: no abnormalities Psyche: negative.     Allergies  Review of patient's allergies indicates no known allergies.  Home Medications   Current Outpatient Rx  Name  Route  Sig  Dispense  Refill  . DILTIAZEM HCL ER COATED BEADS 360 MG PO CP24   Oral   Take 360 mg by mouth daily.           . DSS 100 MG PO CAPS   Oral   Take 100 mg by mouth 2 (two) times daily.         [name] FINASTERIDE 5 MG PO TABS   Oral   Take 5 mg by mouth daily.         [name] FOLIC ACID 1 MG PO TABS   Oral   Take 1 mg by mouth daily.           . FUROSEMIDE 40 MG PO TABS   Oral   Take 40 mg  by mouth daily.         [name] HYDROCODONE-ACETAMINOPHEN 5-500 MG PO TABS   Oral   Take 1 tablet by mouth every 6 (six) hours as needed. For pain         . LISINOPRIL 20 MG PO TABS   Oral   Take 20 mg by mouth daily.           [name] METFORMIN HCL 1000 MG PO TABS   Oral   Take 1,000 mg by mouth 2 (two) times daily with a meal.           . METHOTREXATE (ANTI-RHEUMATIC) 2.5 MG PO TABS   Oral   Take 25 mg by mouth once a week.         [name] PRAVASTATIN SODIUM 40 MG PO TABS   Oral   Take 40 mg by mouth daily.         [name] RANITIDINE HCL 150 MG PO TABS   Oral   Take 150 mg by mouth 2 (two) times daily.           . SENNA 8.6 MG PO TABS   Oral   Take 1 tablet by mouth 2 (two) times daily.         [name] TAMSULOSIN HCL 0.4 MG PO CAPS   Oral   Take 0.4 mg by mouth daily.         [name] TEMAZEPAM 15 MG PO CAPS   Oral   Take 15 mg by mouth at bedtime.           . TERAZOSIN HCL 5 MG PO CAPS   Oral   Take 5 mg by mouth at bedtime.           [name] GABAPENTIN 300 MG PO CAPS   Oral   Take 1 capsule (300 mg total) by mouth 3 (three) times daily.   15  capsule   0     BP 151/77  Pulse 115  Temp 97.9 F (36.6 C) (Oral)  Resp 16  SpO2 99%  Physical Exam  Nursing note and vitals reviewed. Constitutional: He appears well-developed and well-nourished. No distress.  HENT:  Head: Normocephalic and atraumatic.  Eyes: Pupils are equal, round, and reactive to light.  Neck: Normal range of motion. Neck supple.  Cardiovascular: Normal rate and regular rhythm.   Pulmonary/Chest: Effort normal.  Abdominal: Soft.  Musculoskeletal:       Left ankle: He exhibits decreased range of motion and swelling. He exhibits no ecchymosis, no deformity, no laceration and normal pulse. tenderness (diffuse).       Left foot: He exhibits decreased range of motion, tenderness and swelling. He exhibits no bony tenderness, normal capillary refill, no crepitus, no deformity and no laceration.  Neurological: He is alert.  Skin: Skin is warm and dry.    ED Course  Procedures (including critical care time)  Labs Reviewed - No data to display Dg Ankle Complete Left  11/30/2011  *RADIOLOGY REPORT*  Clinical Data: Ankle pain.  LEFT ANKLE COMPLETE - 3+ VIEW  Comparison: 10/02/2011.  Findings: Postoperative changes of arthrodesis at the tibiotalar joint are again noted.  Status post resection of the distal fibula. No obvious lucency surrounding any of the fixation screws is identified to suggest the presence of loosening or definite infection.  Soft tissue swelling surrounding the ankle joint.  No definite acute displaced fracture identified.  Degenerative changes of osteoarthritis throughout the visualized hind foot midfoot.  IMPRESSION: 1.  Postoperative  changes of prior tibiotalar joint arthrodesis and distal fibular resection, as above. 2.  Soft tissue swelling diffusely around the ankle joint.   Original Report Authenticated By: Trudie Reed, M.D.    Dg Foot Complete Left  11/30/2011  *RADIOLOGY REPORT*  Clinical Data: Pain/swelling, prior fusion  LEFT FOOT -  COMPLETE 3+ VIEW  Comparison: None.  Findings: No fracture or dislocation is seen.  Left great toe arthrodesis.  Prior tibiotalar fusion.  Degenerative changes involving the first MTP joint and the dorsal midfoot.  Associated moderate diffuse soft tissue swelling.  IMPRESSION: No acute osseous abnormality is seen.  Postsurgical changes, as described above.  Moderate diffuse soft tissue swelling.   Original Report Authenticated By: Charline Bills, M.D.      1. Foot swelling       MDM  Dr. Ignacia Palma has seen patient as well. Ultrasound Duplex of the foot negative. Pt now going to get xrays of ankle and foot.   Work-up negative for any concerning findings. Gabapentin PO 300 TID started enough given for 5 days. Be advised that he needs to get back in to see Dr. Victorino Dike as soon as possible for follow-up and further evaluation of his swelling. Before discharge the patient does inform me that he only started walking on his foot again this week  Pt has been advised of the symptoms that warrant their return to the ED. Patient has voiced understanding and has agreed to follow-up with the PCP or specialist.      Dorthula Matas, PA 11/30/11 1431

## 2011-11-30 NOTE — Progress Notes (Signed)
VASCULAR LAB PRELIMINARY  PRELIMINARY  PRELIMINARY  PRELIMINARY  Left lower extremity venous Doppler completed.    Preliminary report:  There is no DVT or SVT noted in the left lower extremity.  Caridad Silveira, 11/30/2011, 12:50 PM

## 2011-11-30 NOTE — ED Notes (Signed)
Pt back from radiology 

## 2011-12-01 NOTE — ED Provider Notes (Signed)
Medical screening examination/treatment/procedure(s) were conducted as a shared visit with non-physician practitioner(s) and myself.  I personally evaluated the patient during the encounter 78 yo pt with HIV, swollen right thumb that has not responded to oral antibiotics. Lab workup did not show any other source of fever. Call to [facility] to admit her for IV antibiotics for her thumb infection.       Carleene Cooper III, MD 12/01/11 [phone]

## 2011-12-04 DIAGNOSIS — E7800 Pure hypercholesterolemia, unspecified: Secondary | ICD-10-CM | POA: Diagnosis not present

## 2011-12-04 DIAGNOSIS — M069 Rheumatoid arthritis, unspecified: Secondary | ICD-10-CM | POA: Diagnosis not present

## 2011-12-04 DIAGNOSIS — E782 Mixed hyperlipidemia: Secondary | ICD-10-CM | POA: Diagnosis not present

## 2011-12-04 DIAGNOSIS — E119 Type 2 diabetes mellitus without complications: Secondary | ICD-10-CM | POA: Diagnosis not present

## 2011-12-04 DIAGNOSIS — M25476 Effusion, unspecified foot: Secondary | ICD-10-CM | POA: Diagnosis not present

## 2011-12-04 DIAGNOSIS — Z79899 Other long term (current) drug therapy: Secondary | ICD-10-CM | POA: Diagnosis not present

## 2011-12-04 DIAGNOSIS — M25473 Effusion, unspecified ankle: Secondary | ICD-10-CM | POA: Diagnosis not present

## 2011-12-04 DIAGNOSIS — I10 Essential (primary) hypertension: Secondary | ICD-10-CM | POA: Diagnosis not present

## 2011-12-17 DIAGNOSIS — Z4789 Encounter for other orthopedic aftercare: Secondary | ICD-10-CM | POA: Diagnosis not present

## 2011-12-25 DIAGNOSIS — M069 Rheumatoid arthritis, unspecified: Secondary | ICD-10-CM | POA: Diagnosis not present

## 2011-12-31 DIAGNOSIS — M069 Rheumatoid arthritis, unspecified: Secondary | ICD-10-CM | POA: Diagnosis not present

## 2012-01-14 HISTORY — PX: ANKLE FUSION: SHX881

## 2012-01-14 HISTORY — PX: BIOPSY THYROID: PRO38

## 2012-02-05 DIAGNOSIS — M069 Rheumatoid arthritis, unspecified: Secondary | ICD-10-CM | POA: Diagnosis not present

## 2012-02-09 DIAGNOSIS — H1130 Conjunctival hemorrhage, unspecified eye: Secondary | ICD-10-CM | POA: Diagnosis not present

## 2012-02-12 DIAGNOSIS — M25569 Pain in unspecified knee: Secondary | ICD-10-CM | POA: Diagnosis not present

## 2012-03-15 DIAGNOSIS — M069 Rheumatoid arthritis, unspecified: Secondary | ICD-10-CM | POA: Diagnosis not present

## 2012-04-29 DIAGNOSIS — M069 Rheumatoid arthritis, unspecified: Secondary | ICD-10-CM | POA: Diagnosis not present

## 2012-04-29 DIAGNOSIS — M2550 Pain in unspecified joint: Secondary | ICD-10-CM | POA: Diagnosis not present

## 2012-05-18 ENCOUNTER — Ambulatory Visit (INDEPENDENT_AMBULATORY_CARE_PROVIDER_SITE_OTHER): Payer: Medicare Other | Admitting: Internal Medicine

## 2012-05-18 ENCOUNTER — Encounter: Payer: Self-pay | Admitting: Internal Medicine

## 2012-05-18 VITALS — BP 122/70 | HR 105 | Temp 97.6°F | Resp 12 | Ht 68.0 in | Wt 209.0 lb

## 2012-05-18 DIAGNOSIS — I10 Essential (primary) hypertension: Secondary | ICD-10-CM

## 2012-05-18 DIAGNOSIS — R7989 Other specified abnormal findings of blood chemistry: Secondary | ICD-10-CM | POA: Insufficient documentation

## 2012-05-18 DIAGNOSIS — E119 Type 2 diabetes mellitus without complications: Secondary | ICD-10-CM | POA: Insufficient documentation

## 2012-05-18 DIAGNOSIS — R946 Abnormal results of thyroid function studies: Secondary | ICD-10-CM | POA: Diagnosis not present

## 2012-05-18 DIAGNOSIS — E1129 Type 2 diabetes mellitus with other diabetic kidney complication: Secondary | ICD-10-CM | POA: Diagnosis not present

## 2012-05-18 DIAGNOSIS — K219 Gastro-esophageal reflux disease without esophagitis: Secondary | ICD-10-CM | POA: Insufficient documentation

## 2012-05-18 DIAGNOSIS — E785 Hyperlipidemia, unspecified: Secondary | ICD-10-CM | POA: Diagnosis not present

## 2012-05-18 DIAGNOSIS — E041 Nontoxic single thyroid nodule: Secondary | ICD-10-CM | POA: Diagnosis not present

## 2012-05-18 DIAGNOSIS — M069 Rheumatoid arthritis, unspecified: Secondary | ICD-10-CM | POA: Insufficient documentation

## 2012-05-18 DIAGNOSIS — D649 Anemia, unspecified: Secondary | ICD-10-CM | POA: Insufficient documentation

## 2012-05-18 NOTE — Progress Notes (Signed)
Patient ID: [name], male   DOB: 1930-09-06, 79 y.o.   MRN: [id number]  HPI: [name] is a 79 y.o.-year-old male, referred by [facility], Dr. Viviann Spare C. Lund, for management of right thyroid nodule. He also had a low TSH, unclear if this is a new occurrence. He is here accompanied by his wife and his daughter.  He was first told he had thyroid nodules in 2004. At that time, he had a thyroid U/S and then had a right thyroid nodule drained by Dr. Thomasena Edis (who since then retired). He remembers that he had neck compression symptoms at that time and he got symptomatic relief after having the nodule drained. It is unclear how large was the nodule or if this was a cyst. He did not follow up with Dr. Thomasena Edis afterwards, and also does not remember having thyroid tests checked since. He also did not have a repeat ultrasound in the last 10 years. He restarted to feel the nodule again ~1.5 years ago, but got worse in the last 6 mo. He feels that he gets chocked, has cough with eating. He also has hoarseness. No problems with SOB with laying down. He has problems turning the head towards the right side.  He goes to [facility] in [place] and brings his lab results from his latest check - which include a TSH: 0.248 (04/08/2012). There is no associated free T3 and free T4. It is unclear if patient had a low TSH previously.  No FH of thyroid cancer. No RxTx to head or neck.  Last steroid injection in the 1980s. No recent contrast studies. No OTC meds.   Denies weight changes, palpitations, tremors, anxiety. No heat intolerance. No diarrhea.   He has RA - he is on Remicade q6w.   Other labs (04/08/2012): - lipid panel: 126/136/40/82 - CBC normal except, hemoglobin 10.7 (13.11-16.01), hematocrit 30.6% (40.5-48.5), RBCs 3.46 (4.38-5.43). - CMP normal except glucose 152, BUN 24 (5-23), creatinine 1.7 (0.6-1.4) - Hemoglobin A1c 6.2%  Past Medical History  Diagnosis Date  . Hypertension   .  Pneumonia   . Diabetes mellitus   . GERD (gastroesophageal reflux disease)   . Arthritis     rheumatoid  . Cataracts, bilateral    Past Surgical History  Procedure Laterality Date  . Shoulder surgery      left, done in March 2012  . Rotator cuff repair      right side  . Foot surgery      left foot surgery at 79 year old then again to left foot 1984  . Eye surgery      bilateral cataract surgery lens inplant  . Tonsillectomy    . Total knee arthroplasty  12/16/2010    Procedure: TOTAL KNEE ARTHROPLASTY; rt Surgeon: Raymon Mutton, MD;  Location: [facility] OR;  Service: Orthopedics;  Laterality: Right;  . Ankle fusion  left   History   Social History  . Marital Status: Married    Spouse Name: N/A     Children: Yes, adults  . Years of Education: N/A   Occupational History  . retired   Social History Main Topics  . Smoking status: Former Smoker -- 1.00 packs/day for 15 years    Types: Cigarettes    Quit date: 09/25/1965  . Smokeless tobacco: No     Comment: quit smoking in 1967  . Alcohol Use: Yes     Comment: ocassional wine, beer or liquor  . Drug Use: No  Current Outpatient Prescriptions on File Prior to Visit  Medication Sig Dispense Refill  . diltiazem (CARDIZEM CD) 360 MG 24 hr capsule Take 360 mg by mouth daily.        Tery Sanfilippo Sodium (DSS) 100 MG CAPS Take 100 mg by mouth 2 (two) times daily.      . finasteride (PROSCAR) 5 MG tablet Take 5 mg by mouth daily.      . folic acid (FOLVITE) 1 MG tablet Take 1 mg by mouth daily.        . furosemide (LASIX) 40 MG tablet Take 40 mg by mouth daily.      [name] gabapentin (NEURONTIN) 300 MG capsule Take 1 capsule (300 mg total) by mouth 3 (three) times daily.  15 capsule  0  . HYDROcodone-acetaminophen (VICODIN) 5-500 MG per tablet Take 1 tablet by mouth every 6 (six) hours as needed. For pain      . lisinopril (PRINIVIL,ZESTRIL) 20 MG tablet Take 20 mg by mouth daily.        . metFORMIN (GLUCOPHAGE) 1000 MG tablet Take 1,000  mg by mouth 2 (two) times daily with a meal.        . methotrexate (RHEUMATREX) 2.5 MG tablet Take 25 mg by mouth once a week.      . pravastatin (PRAVACHOL) 40 MG tablet Take 40 mg by mouth daily.      . ranitidine (ZANTAC) 150 MG tablet Take 150 mg by mouth 2 (two) times daily.        [name] senna (SENOKOT) 8.6 MG TABS Take 1 tablet by mouth 2 (two) times daily.      . Tamsulosin HCl (FLOMAX) 0.4 MG CAPS Take 0.4 mg by mouth daily.      . temazepam (RESTORIL) 15 MG capsule Take 15 mg by mouth at bedtime.        [name] terazosin (HYTRIN) 5 MG capsule Take 5 mg by mouth at bedtime.         No current facility-administered medications on file prior to visit.   No Known Allergies  History reviewed. No pertinent family history.  ROS: Constitutional: no weight gain/loss, + increased appetite, no fatigue, no subjective hyperthermia/hypothermia Eyes: no blurry vision, no xerophthalmia ENT: no sore throat, + nodules palpated in throat, + dysphagia/no odynophagia, + hoarseness; hypoacusis - has hearing aids Cardiovascular: no CP/SOB/palpitations/+ leg swelling - Left ankle since his ankle surgery in 09/2011 Respiratory: + cough/no SOB/+ wheezing Gastrointestinal: no N/V/D/C Musculoskeletal: + muscle/+ joint aches and swelling Skin: no rashes Neurological: no tremors/numbness/tingling/dizziness Psychiatric: no depression/anxiety  PE: BP 122/70  Pulse 105  Temp(Src) 97.6 F (36.4 C) (Oral)  Resp 12  Ht 5\' 8"  (1.727 m)  Wt 209 lb (94.802 kg)  BMI 31.79 kg/m2  SpO2 96% Wt Readings from Last 3 Encounters:  05/18/12 209 lb (94.802 kg)  10/03/11 200 lb (90.719 kg)  10/03/11 200 lb (90.719 kg)   Constitutional: overweight, in NAD Eyes: PERRLA, EOMI, no exophthalmos ENT: moist mucous membranes, thyromegaly - R>L (large R thyroid mass, tense), no cervical lymphadenopathy Cardiovascular: RRR, No MRG Respiratory: CTA B Gastrointestinal: abdomen soft, NT, ND, BS+ Musculoskeletal: no deformities,  strength intact in all 4; left ankle edema  Skin: moist, warm, no rashes Neurological: very fine tremor with outstretched hands, DTR normal in all 4  ASSESSMENT: 1. Right thyroid nodule - diagnosed in 2004, previously drained - Associated with neck compression symptoms, except shortness of breath  2. Low TSH - 04/08/2012: TSH 0.248 (0.34-3.77) -  Patient clinically euthyroid  PLAN:  1. Right thyroid nodule - I discussed with the patient and his family that we would need to obtain a new thyroid ultrasound the evaluate the nodule. However, because of his low TSH, we might need to check an uptake and scan first. To clarify whether we need to do this, I will obtain a new set of thyroid tests, to include a TSH, free T4, and free T3. Will proceed in the following way:  If his TSH is normal, will go ahead with the thyroid ultrasound  If his TSH is low, we'll likely go ahead with the uptake and scan, and then do the ultrasound if there is a cold defect associated with the right thyroid nodule - we did discuss about further course of action which most likely would include   an FNA if the nodule is mixed or solid, or   drainage if the nodule is a cyst - I encouraged the patient to join my chart, and I will send him the results through there - He and his family agreed with the plan  ! he is immune suppressed due to his Remicade   2. Low TSH - I would like to confirm this finding by repeating his thyroid tests - see above  Office Visit on 05/18/2012  Component Date Value Range Status  . TSH 05/18/2012 0.37  0.35 - 5.50 uIU/mL Final  . Free T4 05/18/2012 1.28  0.60 - 1.60 ng/dL Final  . T3, Free 16/10/9602 2.7  2.3 - 4.2 pg/mL Final   Labs normal >> Proceed with thyroid U/S.  *RADIOLOGY REPORT*  Clinical Data: Enlarging right thyroid nodule   THYROID ULTRASOUND  Technique: Ultrasound examination of the thyroid gland and adjacent  soft tissues was performed.   Comparison: None    Findings:  Right thyroid lobe: 7.5 x 3.5 x 4.2 cm. Heterogeneous echogenicity.  Left thyroid lobe: 4.5 x 1.4 x 1.7 cm. Heterogeneous echogenicity.  Isthmus: 4 mm thick  Focal nodules: Dominant mass occupying majority of the mid to  inferior right lobe 5.2 x 3.5 x 3.8 cm. Mass contains tiny  calcifications and is heterogeneous in echogenicity.  Additional solid nodule at upper pole right lobe 1.7 x 2.2 x 1.4 cm.  Additional sub centimeter nodules in left lobe and isthmus.  Lymphadenopathy: None identified   IMPRESSION:  Right lobe of thyroid gland is enlarged by a complex heterogeneous  partially calcified mass 5.2 x 3.5 x 3.8 cm in size with an  additional with 1.7 x 2.2 x 1.4 cm diameter nodule upper pole right  lobe.  Findings meet consensus criteria for biopsy.  Will schedule FNA of both nodules.  06/04/2012: FNA results:  Adequacy Reason Satisfactory For Evaluation. Diagnosis THYROID, FINE NEEDLE ASPIRATION, MID TO INFERIOR RIGHT POLE: BENIGN. FINDINGS CONSISTENT WITH A BENIGN FOLLICULAR NODULE. Pecola Leisure MD Pathologist, Electronic Signature (Case signed 06/04/2012) Specimen Clinical Information Dominant mass occupying majority of the mid to inferior right lobe 5.2 x 2.5 x 3.8cm contains tiny calcifications and is heterogeneous, Enlarging right thyroid nodule  ________________________________  Adequacy Reason Satisfactory For Evaluation. Diagnosis THYROID, FINE NEEDLE ASPIRATION, RIGHT UPPER POLE: BENIGN. FINDINGS CONSISTENT WITH A BENIGN FOLLICULAR NODULE. Pecola Leisure MD Pathologist, Electronic Signature (Case signed 06/04/2012) Specimen Clinical Information Solid right upper pole nodule 2.2 cm, Enlarging thyroid nodule  ________________________________  Will send message through MyChart.

## 2012-05-18 NOTE — Patient Instructions (Signed)
Please join MyChart. I will send you the labs and further plan through MyChart.

## 2012-05-20 LAB — T4, FREE: Free T4: 1.28 ng/dL (ref 0.60–1.60)

## 2012-05-20 LAB — TSH: TSH: 0.37 u[IU]/mL (ref 0.35–5.50)

## 2012-05-25 ENCOUNTER — Ambulatory Visit (HOSPITAL_COMMUNITY)
Admission: RE | Admit: 2012-05-25 | Discharge: 2012-05-25 | Disposition: A | Discharge status: Home / Self Care | Payer: Medicare Other | Source: Ambulatory Visit | Attending: Internal Medicine | Admitting: Internal Medicine

## 2012-05-25 DIAGNOSIS — E0789 Other specified disorders of thyroid: Secondary | ICD-10-CM | POA: Diagnosis not present

## 2012-05-25 DIAGNOSIS — E041 Nontoxic single thyroid nodule: Secondary | ICD-10-CM | POA: Insufficient documentation

## 2012-05-27 ENCOUNTER — Telehealth: Payer: Self-pay | Admitting: *Deleted

## 2012-05-27 ENCOUNTER — Other Ambulatory Visit: Payer: Self-pay | Admitting: Internal Medicine

## 2012-05-27 DIAGNOSIS — E041 Nontoxic single thyroid nodule: Secondary | ICD-10-CM

## 2012-05-27 NOTE — Telephone Encounter (Signed)
Called pt and spoke with pt's wife. I told her that has 2 thyroid nodules and Dr Elvera Lennox recommends biopsy. Dr Elvera Lennox has sent him the results. If he agrees with doing the biopsy, she will place the order for the Fine Needle Aspiration/biopsy. Wife stated she and pt would discuss and call us back today.

## 2012-05-27 NOTE — Telephone Encounter (Signed)
OK - I placed the order for FNA both nodules.

## 2012-05-27 NOTE — Telephone Encounter (Signed)
Pt called back and would like to proceed with doing the biopsy. Pt asked to be called at this number with the appt. #[phone]. Please advise.

## 2012-06-01 DIAGNOSIS — E119 Type 2 diabetes mellitus without complications: Secondary | ICD-10-CM | POA: Diagnosis not present

## 2012-06-01 DIAGNOSIS — I10 Essential (primary) hypertension: Secondary | ICD-10-CM | POA: Diagnosis not present

## 2012-06-01 DIAGNOSIS — M069 Rheumatoid arthritis, unspecified: Secondary | ICD-10-CM | POA: Diagnosis not present

## 2012-06-01 DIAGNOSIS — E041 Nontoxic single thyroid nodule: Secondary | ICD-10-CM | POA: Diagnosis not present

## 2012-06-01 DIAGNOSIS — E782 Mixed hyperlipidemia: Secondary | ICD-10-CM | POA: Diagnosis not present

## 2012-06-01 DIAGNOSIS — Z79899 Other long term (current) drug therapy: Secondary | ICD-10-CM | POA: Diagnosis not present

## 2012-06-03 ENCOUNTER — Other Ambulatory Visit (HOSPITAL_COMMUNITY)
Admission: RE | Admit: 2012-06-03 | Discharge: 2012-06-03 | Disposition: A | Discharge status: Home / Self Care | Payer: Medicare Other | Source: Ambulatory Visit | Attending: Interventional Radiology | Admitting: Interventional Radiology

## 2012-06-03 ENCOUNTER — Ambulatory Visit
Admission: RE | Admit: 2012-06-03 | Discharge: 2012-06-03 | Disposition: A | Discharge status: Home / Self Care | Payer: Medicare Other | Source: Ambulatory Visit | Attending: Internal Medicine | Admitting: Internal Medicine

## 2012-06-03 DIAGNOSIS — E041 Nontoxic single thyroid nodule: Secondary | ICD-10-CM

## 2012-06-03 DIAGNOSIS — E049 Nontoxic goiter, unspecified: Secondary | ICD-10-CM | POA: Insufficient documentation

## 2012-06-03 DIAGNOSIS — E042 Nontoxic multinodular goiter: Secondary | ICD-10-CM | POA: Diagnosis not present

## 2012-06-08 ENCOUNTER — Telehealth: Payer: Self-pay | Admitting: *Deleted

## 2012-06-08 NOTE — Telephone Encounter (Signed)
Faxed note to Dr Hendricks Limes at [facility] in [place] for Dr Elvera Lennox. # [phone].

## 2012-06-09 ENCOUNTER — Telehealth: Payer: Self-pay | Admitting: Internal Medicine

## 2012-06-09 NOTE — Telephone Encounter (Signed)
daug in law called, what is next step in treatment of thyroid nodule since bx was benign. CB# [phone] / Sherri S.

## 2012-06-10 ENCOUNTER — Telehealth: Payer: Self-pay | Admitting: *Deleted

## 2012-06-10 DIAGNOSIS — M069 Rheumatoid arthritis, unspecified: Secondary | ICD-10-CM | POA: Diagnosis not present

## 2012-06-10 NOTE — Telephone Encounter (Signed)
Please read note below and advise. Thank you.  

## 2012-06-10 NOTE — Telephone Encounter (Signed)
We would clinically monitor for growth (he needs to let me know if he starts feeling SOB, if her has problems swallowing, feels it growing, etc.). If these symptoms become very bothersome, we would refer him to surgery. Otherwise, I will see him back in a year and I will reevaluate the nodule then. Carollee Herter, can you advise them about the above?

## 2012-06-10 NOTE — Telephone Encounter (Signed)
Called pt's daughter, Lin Givens [phone]) and lvm that Dr Elvera Lennox said she would clinically monitor for growth. Of course if he (or a family member needs to let me know if he starts feeling SOB, if her has problems swallowing, feels it growing, etc. If these symptoms become very bothersome, we would refer him to surgery. Otherwise, I will see him back in a year and I will reevaluate the nodule then. If they have any further questions or concerns please call us back.

## 2012-07-05 DIAGNOSIS — D649 Anemia, unspecified: Secondary | ICD-10-CM | POA: Diagnosis not present

## 2012-07-05 DIAGNOSIS — R944 Abnormal results of kidney function studies: Secondary | ICD-10-CM | POA: Diagnosis not present

## 2012-07-22 DIAGNOSIS — M069 Rheumatoid arthritis, unspecified: Secondary | ICD-10-CM | POA: Diagnosis not present

## 2012-07-26 DIAGNOSIS — Z79899 Other long term (current) drug therapy: Secondary | ICD-10-CM | POA: Diagnosis not present

## 2012-07-26 DIAGNOSIS — I10 Essential (primary) hypertension: Secondary | ICD-10-CM | POA: Diagnosis not present

## 2012-07-29 DIAGNOSIS — M069 Rheumatoid arthritis, unspecified: Secondary | ICD-10-CM | POA: Diagnosis not present

## 2012-08-18 ENCOUNTER — Other Ambulatory Visit: Payer: Self-pay

## 2012-08-18 DIAGNOSIS — R944 Abnormal results of kidney function studies: Secondary | ICD-10-CM | POA: Diagnosis not present

## 2012-09-02 DIAGNOSIS — M069 Rheumatoid arthritis, unspecified: Secondary | ICD-10-CM | POA: Diagnosis not present

## 2012-09-30 ENCOUNTER — Other Ambulatory Visit: Payer: Self-pay | Admitting: Nephrology

## 2012-09-30 DIAGNOSIS — R7989 Other specified abnormal findings of blood chemistry: Secondary | ICD-10-CM

## 2012-09-30 DIAGNOSIS — Z79899 Other long term (current) drug therapy: Secondary | ICD-10-CM | POA: Diagnosis not present

## 2012-09-30 DIAGNOSIS — N179 Acute kidney failure, unspecified: Secondary | ICD-10-CM | POA: Diagnosis not present

## 2012-09-30 DIAGNOSIS — N2581 Secondary hyperparathyroidism of renal origin: Secondary | ICD-10-CM | POA: Diagnosis not present

## 2012-09-30 DIAGNOSIS — I10 Essential (primary) hypertension: Secondary | ICD-10-CM | POA: Diagnosis not present

## 2012-09-30 DIAGNOSIS — E119 Type 2 diabetes mellitus without complications: Secondary | ICD-10-CM | POA: Diagnosis not present

## 2012-09-30 DIAGNOSIS — D509 Iron deficiency anemia, unspecified: Secondary | ICD-10-CM | POA: Diagnosis not present

## 2012-10-05 ENCOUNTER — Ambulatory Visit
Admission: RE | Admit: 2012-10-05 | Discharge: 2012-10-05 | Disposition: A | Discharge status: Home / Self Care | Payer: Medicare Other | Source: Ambulatory Visit | Attending: Nephrology | Admitting: Nephrology

## 2012-10-05 DIAGNOSIS — IMO0002 Reserved for concepts with insufficient information to code with codable children: Secondary | ICD-10-CM

## 2012-10-05 DIAGNOSIS — N189 Chronic kidney disease, unspecified: Secondary | ICD-10-CM | POA: Diagnosis not present

## 2012-10-05 DIAGNOSIS — R7989 Other specified abnormal findings of blood chemistry: Secondary | ICD-10-CM

## 2012-10-06 ENCOUNTER — Other Ambulatory Visit: Payer: Medicare Other

## 2012-10-12 DIAGNOSIS — M069 Rheumatoid arthritis, unspecified: Secondary | ICD-10-CM | POA: Diagnosis not present

## 2012-10-22 DIAGNOSIS — M25579 Pain in unspecified ankle and joints of unspecified foot: Secondary | ICD-10-CM | POA: Diagnosis not present

## 2012-10-22 DIAGNOSIS — M069 Rheumatoid arthritis, unspecified: Secondary | ICD-10-CM | POA: Diagnosis not present

## 2012-11-02 DIAGNOSIS — E7800 Pure hypercholesterolemia, unspecified: Secondary | ICD-10-CM | POA: Diagnosis not present

## 2012-11-02 DIAGNOSIS — N400 Enlarged prostate without lower urinary tract symptoms: Secondary | ICD-10-CM | POA: Diagnosis not present

## 2012-11-02 DIAGNOSIS — Z79899 Other long term (current) drug therapy: Secondary | ICD-10-CM | POA: Diagnosis not present

## 2012-11-02 DIAGNOSIS — M069 Rheumatoid arthritis, unspecified: Secondary | ICD-10-CM | POA: Diagnosis not present

## 2012-11-02 DIAGNOSIS — E782 Mixed hyperlipidemia: Secondary | ICD-10-CM | POA: Diagnosis not present

## 2012-11-02 DIAGNOSIS — I10 Essential (primary) hypertension: Secondary | ICD-10-CM | POA: Diagnosis not present

## 2012-11-02 DIAGNOSIS — E119 Type 2 diabetes mellitus without complications: Secondary | ICD-10-CM | POA: Diagnosis not present

## 2012-11-02 DIAGNOSIS — Z23 Encounter for immunization: Secondary | ICD-10-CM | POA: Diagnosis not present

## 2012-11-18 ENCOUNTER — Other Ambulatory Visit: Payer: Self-pay

## 2012-11-18 DIAGNOSIS — E119 Type 2 diabetes mellitus without complications: Secondary | ICD-10-CM | POA: Diagnosis not present

## 2012-11-18 DIAGNOSIS — Z79899 Other long term (current) drug therapy: Secondary | ICD-10-CM | POA: Diagnosis not present

## 2012-11-22 DIAGNOSIS — M069 Rheumatoid arthritis, unspecified: Secondary | ICD-10-CM | POA: Diagnosis not present

## 2012-11-22 DIAGNOSIS — M25579 Pain in unspecified ankle and joints of unspecified foot: Secondary | ICD-10-CM | POA: Diagnosis not present

## 2012-11-23 ENCOUNTER — Encounter: Payer: Self-pay | Admitting: Internal Medicine

## 2012-11-23 ENCOUNTER — Ambulatory Visit (INDEPENDENT_AMBULATORY_CARE_PROVIDER_SITE_OTHER): Payer: Medicare Other | Admitting: Internal Medicine

## 2012-11-23 VITALS — BP 124/62 | HR 96 | Temp 97.9°F | Resp 10 | Wt 217.7 lb

## 2012-11-23 DIAGNOSIS — E042 Nontoxic multinodular goiter: Secondary | ICD-10-CM | POA: Diagnosis not present

## 2012-11-23 NOTE — Progress Notes (Signed)
Patient ID: [name], male   DOB: December 06, 1930, 79 y.o.   MRN: [id number]  HPI: [name] is a 79 y.o.-year-old male, returning for f/u for 2 right thyroid nodules. He also had a low TSH, which subsequently resolved. He is here with his wife and daughter.  Reviewed hx: Pt was first found to have thyroid nodules in 2004. At that time, he had a thyroid U/S and then had a right thyroid nodule drained by Dr. Thomasena Edis (who since then retired). He remembers that he had neck compression symptoms at that time and he got symptomatic relief after having the nodule drained. It is unclear how large was the nodule or if this was a cyst.  He restarted to feel the nodule again ~2 years ago.  Approx. 3 mo ago started to feel that the food gets stuck in his throat and occasionally he needs to spit it out/vomit; also has occasional hoarseness, especially when he talks more.   Reviewed latest thyroid tests: Lab Results  Component Value Date   TSH 0.37 05/18/2012   FREET4 1.28 05/18/2012  FT3 also normal  Denies weight changes, palpitations, tremors, anxiety. No heat intolerance. No diarrhea.   He goes to [facility] in [place].  Labs (04/08/2012): - lipid panel: 126/136/40/82 - CBC normal except, hemoglobin 10.7 (13.11-16.01), hematocrit 30.6% (40.5-48.5), RBCs 3.46 (4.38-5.43). - CMP normal except glucose 152, BUN 24 (5-23), creatinine 1.7 (0.6-1.4) - Hemoglobin A1c 6.2%  He has RA - he is on Remicade q6w.  I reviewed pt's medications, allergies, PMH, social hx, family hx and no changes required, except as mentioned above, except stopped Lasix.   ROS: Constitutional: + weight gain, + fatigue, no subjective hyperthermia/hypothermia, + nocturia Eyes: no blurry vision, no xerophthalmia ENT: + sore throat, + nodules palpated in throat, + dysphagia/no odynophagia, + hoarseness; hypoacusis - has hearing aids Cardiovascular: no CP/SOB/palpitations/+ leg swelling - Left ankle since his ankle surgery in  09/2011 Respiratory: + cough/no SOB/+ wheezing Gastrointestinal: no N/V/D/C Musculoskeletal: + muscle/+ joint aches and swelling Skin: no rashes  PE: BP 124/62  Pulse 96  Temp(Src) 97.9 F (36.6 C) (Oral)  Resp 10  Wt 217 lb 11.2 oz (98.748 kg)  SpO2 96% Wt Readings from Last 3 Encounters:  11/23/12 217 lb 11.2 oz (98.748 kg)  05/18/12 209 lb (94.802 kg)  10/03/11 200 lb (90.719 kg)   Constitutional: overweight, in NAD Eyes: PERRLA, EOMI, no exophthalmos ENT: moist mucous membranes, thyromegaly - R>L (large R thyroid mass, tense), no cervical lymphadenopathy Cardiovascular: RRR, No MRG Respiratory: CTA B Gastrointestinal: abdomen soft, NT, ND, BS+ Musculoskeletal: no deformities, strength intact in all 4; left ankle edema  Skin: moist, warm, no rashes Neurological: very fine tremor with outstretched hands, DTR normal in all 4  ASSESSMENT: 1. Right thyroid nodule - diagnosed in 2004, previously drained - Associated with neck compression symptoms - 05/25/2012: Thyroid U/S:  Right thyroid lobe: 7.5 x 3.5 x 4.2 cm. Heterogeneous echogenicity.   Left thyroid lobe: 4.5 x 1.4 x 1.7 cm. Heterogeneous echogenicity.   Isthmus: 4 mm thick   Focal nodules: Dominant mass occupying majority of the mid to inferior right lobe 5.2 x 3.5 x 3.8 cm. Mass contains tiny calcifications and is heterogeneous in echogenicity. Additional solid nodule at upper pole right lobe 1.7 x 2.2 x 1.4 cm. Additional sub centimeter nodules in left lobe and isthmus.   Lymphadenopathy: None identified  - 06/04/2012: FNA results: both nodules benign  2. Low TSH -  04/08/2012: TSH 0.248 (0.34-3.77) - 05/18/2012: TSH 0.37, fT4 1.28, fT3 2.7- Patient clinically euthyroid  PLAN:  1. Right thyroid nodules - Pt continues to be bothered and have neck compression sxs and, based on the size of her right thyroid nodules, it is conceivable that these may be related. - patient and family agrees that when he to do a  barium swallow to see if the compression is from the right thyroid lobe or excuse from esophageal stricture or other intraesophageal pathology - I ordered a barium swallow - We discussed about the fact that if the compression is from the thyroid, I recommend hemithyroidectomy - they agree with this - I explained briefly what the surgery entails, and the hospitalization that he should expect - We also discussed about the fact that there is a 24% risk of hypothyroidism after hemithyroidectomy - I will see the patient back in a year, but will await the results of the barium swallow and will refer to surgery if needed  2. Low TSH - resolved as per last check 6 mo ago.  12/07/2012 CLINICAL DATA: Goiter with right-sided thyroid nodules. Dysphagia  to solids and liquids. Feeling of food getting stuck in the throat  to the right of midline with occasional regurgitation.  EXAM:  ESOPHOGRAM / BARIUM SWALLOW / BARIUM TABLET STUDY  TECHNIQUE:  Combined double contrast and single contrast examination performed  using effervescent crystals, thick barium liquid, and thin barium  liquid. The patient was observed with fluoroscopy swallowing a 13mm  barium sulphate tablet.  COMPARISON: None.  FLUOROSCOPY TIME: 4 min 34 seconds  FINDINGS:  There was an episode of small volume aspiration early in the study,  with residual coating of the larynx and trachea seen throughout the  remainder of the examination. There may have also been an additional  episode of trace aspiration versus flash penetration during the  lateral cervical esophageal evaluation. No fixed esophageal  narrowing/ stricture was identified. No filling defect was seen.  Prominent tertiary waves were seen. There is a small sliding-type  hiatal hernia. No gastroesophageal reflux was identified  spontaneously or with provocative maneuvers.  On AP images of the cervical esophagus, a mild impression was seen  on the right lateral lower cervical  esophagus without narrowing.  Barium tablet passed without delay.  IMPRESSION:  1. Episode of aspiration.  2. Mild impression on the right lateral cervical esophagus, likely  secondary to known goiter. No evidence of significant fixed  esophageal narrowing.  3. Small sliding hiatal hernia.  4. Prominent tertiary waves.  Electronically Signed  By: Sebastian Ache  On: 12/07/2012 12:38  Msg sent: Dear Mr Cokley, The Barium swallow test shows that there is mild compression of your esophagus by the thyroid nodule.  It depends on the severity of your symptoms whether to refer you to surgery to take half of the thyroid out or not. I would be happy to do this if you decide for it. If the symptoms are not severe, we can always wait and continue to follow your symptoms. Please let me know what you think. Sincerely, William Pavlov MD  Pt decided for the surgery >> will refer.

## 2012-11-23 NOTE — Patient Instructions (Addendum)
You will be called to schedule the barium swallowing test. Please send me a message or call me if you are not called about this in 5 working days. Please return in 1 year.

## 2012-12-01 ENCOUNTER — Encounter: Payer: Self-pay | Admitting: Internal Medicine

## 2012-12-02 NOTE — Telephone Encounter (Signed)
Can you check and see if this pt's Barium Swallow has been taken care of?

## 2012-12-06 DIAGNOSIS — R3915 Urgency of urination: Secondary | ICD-10-CM | POA: Diagnosis not present

## 2012-12-06 DIAGNOSIS — E119 Type 2 diabetes mellitus without complications: Secondary | ICD-10-CM | POA: Diagnosis not present

## 2012-12-06 DIAGNOSIS — I10 Essential (primary) hypertension: Secondary | ICD-10-CM | POA: Diagnosis not present

## 2012-12-06 NOTE — Telephone Encounter (Signed)
I sent a message to [facility] on Thurs. She called me on Friday to let me know that it was scheduled, not sure why this took so long. He is scheduled for 11/25. Be advised.

## 2012-12-07 ENCOUNTER — Ambulatory Visit (HOSPITAL_COMMUNITY)
Admission: RE | Admit: 2012-12-07 | Discharge: 2012-12-07 | Disposition: A | Discharge status: Home / Self Care | Payer: Medicare Other | Source: Ambulatory Visit | Attending: Internal Medicine | Admitting: Internal Medicine

## 2012-12-07 DIAGNOSIS — R1310 Dysphagia, unspecified: Secondary | ICD-10-CM | POA: Diagnosis not present

## 2012-12-07 DIAGNOSIS — E042 Nontoxic multinodular goiter: Secondary | ICD-10-CM | POA: Insufficient documentation

## 2012-12-07 DIAGNOSIS — K449 Diaphragmatic hernia without obstruction or gangrene: Secondary | ICD-10-CM | POA: Insufficient documentation

## 2012-12-11 ENCOUNTER — Encounter: Payer: Self-pay | Admitting: Internal Medicine

## 2013-01-04 DIAGNOSIS — M069 Rheumatoid arthritis, unspecified: Secondary | ICD-10-CM | POA: Diagnosis not present

## 2013-01-11 ENCOUNTER — Ambulatory Visit (INDEPENDENT_AMBULATORY_CARE_PROVIDER_SITE_OTHER): Payer: Medicare Other | Admitting: Surgery

## 2013-01-11 ENCOUNTER — Encounter (INDEPENDENT_AMBULATORY_CARE_PROVIDER_SITE_OTHER): Payer: Self-pay | Admitting: Surgery

## 2013-01-11 VITALS — BP 142/74 | HR 88 | Temp 97.6°F | Resp 16 | Ht 68.0 in | Wt 214.4 lb

## 2013-01-11 DIAGNOSIS — E041 Nontoxic single thyroid nodule: Secondary | ICD-10-CM

## 2013-01-11 NOTE — Progress Notes (Signed)
General Surgery - [facility]  Chief Complaint  Patient presents with  . New Evaluation    thyroid goiter with compressive symptoms - referral from Dr. Cristina Gherghe    HISTORY: Patient is an 79-year-old male referred by his endocrinologist for evaluation for right thyroid lobectomy. Patient was diagnosed in 2004 with right-sided thyroid nodules. He had developed dysphagia. Apparently, the dominant nodule was cystic it that time and responded to aspiration.  However, in recent months, the patient has noted progressive dysphagia, particularly of solid foods. Repeat ultrasound showed a 5.2 cm dominant nodule in the right lobe and a 2.2 cm nodule in the upper pole of the right lobe. Patient underwent fine-needle aspiration biopsy which showed findings consistent with benign follicular nodule. No evidence of malignancy was identified.  Patient underwent barium swallow. This did demonstrate an extrinsic compression of the cervical esophagus, suspicious for compression by the thyroid.  Patient has had no prior head or neck surgery. He has never been on thyroid medication. His most recent TSH level was 0.37. There is no family history of thyroid disease and specifically no history of thyroid cancer. There is no family history of endocrine neoplasm.  Past Medical History  Diagnosis Date  . Hypertension   . Pneumonia   . Diabetes mellitus   . GERD (gastroesophageal reflux disease)   . Arthritis     rheumatoid  . Cataracts, bilateral   . Anemia   . Hyperlipidemia   . Chronic kidney disease     Current Outpatient Prescriptions  Medication Sig Dispense Refill  . diltiazem (CARDIZEM CD) 360 MG 24 hr capsule Take 360 mg by mouth daily.        . Docusate Sodium (DSS) 100 MG CAPS Take 100 mg by mouth 2 (two) times daily.      . finasteride (PROSCAR) 5 MG tablet Take 5 mg by mouth daily.      . folic acid (FOLVITE) 1 MG tablet Take 1 mg by mouth daily.        . furosemide  (LASIX) 40 MG tablet Take 40 mg by mouth daily.      . gabapentin (NEURONTIN) 300 MG capsule Take 1 capsule (300 mg total) by mouth 3 (three) times daily.  15 capsule  0  . HYDROcodone-acetaminophen (VICODIN) 5-500 MG per tablet Take 1 tablet by mouth every 6 (six) hours as needed. For pain      . lisinopril (PRINIVIL,ZESTRIL) 20 MG tablet Take 20 mg by mouth daily.        . metFORMIN (GLUCOPHAGE) 1000 MG tablet Take 1,000 mg by mouth 2 (two) times daily with a meal.        . methotrexate (RHEUMATREX) 2.5 MG tablet Take 25 mg by mouth once a week.      . pravastatin (PRAVACHOL) 40 MG tablet Take 40 mg by mouth daily.      . ranitidine (ZANTAC) 150 MG tablet Take 150 mg by mouth 2 (two) times daily.        . senna (SENOKOT) 8.6 MG TABS Take 1 tablet by mouth 2 (two) times daily.      . Tamsulosin HCl (FLOMAX) 0.4 MG CAPS Take 0.4 mg by mouth daily.      . temazepam (RESTORIL) 15 MG capsule Take 15 mg by mouth at bedtime.        . terazosin (HYTRIN) 5 MG capsule Take 5 mg by mouth at bedtime.         No current   facility-administered medications for this visit.    No Known Allergies  Family History  Problem Relation Age of Onset  . Cancer Mother     breast    History   Social History  . Marital Status: Married    Spouse Name: N/A    Number of Children: N/A  . Years of Education: N/A   Social History Main Topics  . Smoking status: Former Smoker -- 1.00 packs/day for 15 years    Types: Cigarettes    Quit date: 09/25/1965  . Smokeless tobacco: None     Comment: quit smoking in 1967  . Alcohol Use: No     Comment: ocassional wine, beer or liquor  . Drug Use: No  . Sexual Activity: None   Other Topics Concern  . None   Social History Narrative  . None    REVIEW OF SYSTEMS - PERTINENT POSITIVES ONLY: Solid food dysphagia, moderate to severe. Mild dysphagia with liquids. No history of aspiration. No pain. Denies tremor. Denies palpitations.  EXAM: Filed Vitals:   01/11/13  1352  BP: 142/74  Pulse: 88  Temp: 97.6 F (36.4 C)  Resp: 16    GENERAL: well-developed, well-nourished, no acute distress HEENT: normocephalic; pupils equal and reactive; sclerae clear; dentition good; mucous membranes moist NECK:  Palpable dominant mass in the mid and lower portion of the right thyroid lobe extending beneath the clavicle; left lobe without palpable nodules; asymmetric on extension; no palpable anterior or posterior cervical lymphadenopathy; no supraclavicular masses; no tenderness CHEST: clear to auscultation bilaterally without rales, rhonchi, or wheezes CARDIAC: regular rate and rhythm without significant murmur; peripheral pulses are full EXT:  non-tender without edema; no deformity NEURO: no gross focal deficits; no sign of tremor   LABORATORY RESULTS: See [facility] (CHL-Epic) for most recent results  RADIOLOGY RESULTS: See [facility] (CHL-Epic) for most recent results  IMPRESSION: Multinodular thyroid goiter, right lobe, with compressive symptoms  PLAN: I discussed the above findings at length with the patient and his daughter. We discussed right thyroid lobectomy for removal of the dominant nodules and relief of his compressive symptoms. We discussed the potential need for thyroid hormone supplementation. We discussed potential complications including injury to recurrent laryngeal nerves or to parathyroid glands. We discussed the potential need for total thyroidectomy if there were any suspicious findings on the left side of the gland at the time of surgery. We discussed his hospital stay and his postoperative recovery. They understand and wish to proceed with surgery. They request [place]. We will make arrangements at a time convenient for the patient in the near future.  The risks and benefits of the procedure have been discussed at length with the patient.  The patient understands the proposed procedure, potential alternative treatments, and  the course of recovery to be expected.  All of the patient's questions have been answered at this time.  The patient wishes to proceed with surgery.  Gabriellia Rempel M. Rolla Kedzierski, MD, FACS General & Endocrine Surgery [facility]  Primary Care Physician: COX,KIRSTEN, MD   

## 2013-01-11 NOTE — Patient Instructions (Signed)

## 2013-01-21 ENCOUNTER — Encounter (HOSPITAL_COMMUNITY): Payer: Self-pay | Admitting: Pharmacy Technician

## 2013-01-25 ENCOUNTER — Other Ambulatory Visit (HOSPITAL_COMMUNITY): Payer: Self-pay | Admitting: *Deleted

## 2013-01-25 DIAGNOSIS — Z79899 Other long term (current) drug therapy: Secondary | ICD-10-CM | POA: Diagnosis not present

## 2013-01-25 NOTE — Pre-Procedure Instructions (Signed)
DUSTON SMOLENSKI  01/25/2013   Your procedure is scheduled on:  Tuesday, February 01, 2013 at 7:30 AM.   Report to [facility] Entrance "A" Admitting Office at 5:30 AM.   Call this number if you have problems the morning of surgery: [phone]   Remember:   Do not eat food or drink liquids after midnight Monday, 01/31/13.   Take these medicines the morning of surgery with A SIP OF WATER: diltiazem (CARDIZEM CD), gabapentin (NEURONTIN), ranitidine (ZANTAC), Tamsulosin HCl (FLOMAX), HYDROcodone-acetaminophen (VICODIN) - if needed.     Do not wear jewelry.  Do not wear lotions, powders, or cologne. You may wear deodorant.  Men may shave face and neck.  Do not bring valuables to the hospital.  [facility] is not responsible                  for any belongings or valuables.               Contacts, dentures or bridgework may not be worn into surgery.  Leave suitcase in the car. After surgery it may be brought to your room.  For patients admitted to the hospital, discharge time is determined by your                treatment team.               Special Instructions: Shower using CHG 2 nights before surgery and the night before surgery.  If you shower the day of surgery use CHG.  Use special wash - you have one bottle of CHG for all showers.  You should use approximately 1/3 of the bottle for each shower.   Please read over the following fact sheets that you were given: Pain Booklet, Coughing and Deep Breathing and Surgical Site Infection Prevention

## 2013-01-26 ENCOUNTER — Encounter (HOSPITAL_COMMUNITY)
Admission: RE | Admit: 2013-01-26 | Discharge: 2013-01-26 | Disposition: A | Discharge status: Home / Self Care | Payer: Medicare Other | Source: Ambulatory Visit | Attending: Anesthesiology | Admitting: Anesthesiology

## 2013-01-26 ENCOUNTER — Encounter (HOSPITAL_COMMUNITY)
Admission: RE | Admit: 2013-01-26 | Discharge: 2013-01-26 | Disposition: A | Discharge status: Home / Self Care | Payer: Medicare Other | Source: Ambulatory Visit | Attending: Surgery | Admitting: Surgery

## 2013-01-26 ENCOUNTER — Encounter (HOSPITAL_COMMUNITY): Payer: Self-pay

## 2013-01-26 DIAGNOSIS — Z01811 Encounter for preprocedural respiratory examination: Secondary | ICD-10-CM | POA: Insufficient documentation

## 2013-01-26 DIAGNOSIS — Z01818 Encounter for other preprocedural examination: Secondary | ICD-10-CM | POA: Insufficient documentation

## 2013-01-26 DIAGNOSIS — Z01812 Encounter for preprocedural laboratory examination: Secondary | ICD-10-CM | POA: Diagnosis not present

## 2013-01-26 DIAGNOSIS — Z01810 Encounter for preprocedural cardiovascular examination: Secondary | ICD-10-CM | POA: Diagnosis not present

## 2013-01-26 DIAGNOSIS — J9819 Other pulmonary collapse: Secondary | ICD-10-CM | POA: Diagnosis not present

## 2013-01-26 HISTORY — DX: Personal history of other medical treatment: Z92.89

## 2013-01-26 HISTORY — DX: Unspecified hearing loss, unspecified ear: H91.90

## 2013-01-26 HISTORY — DX: Polyneuropathy, unspecified: G62.9

## 2013-01-26 HISTORY — DX: Constipation, unspecified: K59.00

## 2013-01-26 HISTORY — DX: Effusion, unspecified joint: M25.40

## 2013-01-26 HISTORY — DX: Reserved for inherently not codable concepts without codable children: IMO0001

## 2013-01-26 LAB — CBC
HCT: 39.8 % (ref 39.0–52.0)
Hemoglobin: 14.7 g/dL (ref 13.0–17.0)
MCH: 31.1 pg (ref 26.0–34.0)
MCHC: 36.9 g/dL — AB (ref 30.0–36.0)
MCV: 84.3 fL (ref 78.0–100.0)
Platelets: 229 10*3/uL (ref 150–400)
RBC: 4.72 MIL/uL (ref 4.22–5.81)
RDW: 12.8 % (ref 11.5–15.5)
WBC: 7.7 10*3/uL (ref 4.0–10.5)

## 2013-01-26 LAB — BASIC METABOLIC PANEL
BUN: 22 mg/dL (ref 6–23)
CO2: 28 mEq/L (ref 19–32)
Calcium: 9.4 mg/dL (ref 8.4–10.5)
Chloride: 99 mEq/L (ref 96–112)
Creatinine, Ser: 1.63 mg/dL — ABNORMAL HIGH (ref 0.50–1.35)
GFR calc Af Amer: 44 mL/min — ABNORMAL LOW (ref >90)
GFR, EST NON AFRICAN AMERICAN: 38 mL/min — AB (ref >90)
GLUCOSE: 233 mg/dL — AB (ref 70–99)
Potassium: 3.4 mEq/L — ABNORMAL LOW (ref 3.7–5.3)
Sodium: 139 mEq/L (ref 137–147)

## 2013-01-26 NOTE — Pre-Procedure Instructions (Signed)
ELYJAH HAZAN  01/26/2013   Your procedure is scheduled on:  Tues, Jan 20 @ 7:30 AM  Report to [facility] Entrance A  at 5:30 AM.  Call this number if you have problems the morning of surgery: [phone]   Remember:   Do not eat food or drink liquids after midnight.   Take these medicines the morning of surgery with A SIP OF WATER: Cardizem(Diltiazem),Proscar(Finasteride),Gabapentin(Neurontin),Pain Pill(if needed),Zantac(Ranitidine),and Tamsulosin(Flomax)               No Goody's,BC's,Aleve,Aspirin,Ibuprofen,Fish Oil,or any Herbal Medications   Do not wear jewelry  Do not wear lotions, powders, or colognes. You may wear deodorant.  Men may shave face and neck.  Do not bring valuables to the hospital.  [facility] is not responsible                  for any belongings or valuables.               Contacts, dentures or bridgework may not be worn into surgery.  Leave suitcase in the car. After surgery it may be brought to your room.  For patients admitted to the hospital, discharge time is determined by your                treatment team.               Patients discharged the day of surgery will not be allowed to drive  home.  Special Instructions: Shower using CHG 2 nights before surgery and the night before surgery.  If you shower the day of surgery use CHG.  Use special wash - you have one bottle of CHG for all showers.  You should use approximately 1/3 of the bottle for each shower.   Please read over the following fact sheets that you were given: Pain Booklet, Coughing and Deep Breathing and Surgical Site Infection Prevention

## 2013-01-26 NOTE — Progress Notes (Signed)
Quick Note:  These results are acceptable for scheduled surgery.  [name] M. William Moorefield, MD, FACS [facility] Office: [phone]   ______ 

## 2013-01-26 NOTE — Progress Notes (Signed)
Anesthesia Chart Review:  Patient is a 80 year old male scheduled for right thyroid lobectomy for multinodular goiter with compressive symptoms (dysphagia) on 02/01/13 by Dr. Harlow Asa.  Other history includes former smoker, HLD, GERD, impaired hearing, DM2 with peripheral neuropathy, HTN, anemia, RA, CKD, cataract extraction, left ankle fusion '13, right TKA '12, left shoulder hemiarthroplasty. BMI 32. PCP is listed as Dr. [name].  He is also followed at the [facility]) by Dr. Cathlyn Parsons. Endocrinologist is Dr. Philemon Kingdom.  EKG on 01/26/13 showed NSR, non-specific ST abnormality. Overall, I think it appears stable when compared to prior tracing on 12/16/10. He reported a routine stress test at [facility] > 5 years ago and a cardiac cath ~ 30 years ago.  CXR on 01/26/13 showed subsegmental atelectasis at the lung bases.  Preoperative labs noted.  Cr 1.63 (1.39-2.2 since 12/2010).  Non-fasting glucose 233.  He will get a fasting CBG on arrival. His last TSH, free T4 and T3 were WNL on 05/18/12.  I think his pre-operative testing results appear stable.  He will be evaluated by his assigned anesthesiologist on the day of surgery, but if no acute changes then I would anticipate that he could proceed as planned.  George Hugh [facility] Short Stay Center/Anesthesiology Phone [phone] 01/26/2013 12:37 PM

## 2013-01-26 NOTE — Progress Notes (Signed)
Pt doesn't have a cardiologist  Stress test done at least 40yrs ago at [facility] as part of routine checkup  Heart cath done about 50yrs ago  Denies ever having an echo  Dr.Kirsten Cox is Medical Md  Denies EKG or CXR in past yr

## 2013-01-31 MED ORDER — CEFAZOLIN SODIUM-DEXTROSE 2-3 GM-% IV SOLR
2.0000 g | INTRAVENOUS | Status: AC
  Administered 2013-02-01: 2 g via INTRAVENOUS
  Filled 2013-01-31: qty 50

## 2013-02-01 ENCOUNTER — Encounter (HOSPITAL_COMMUNITY): Payer: Self-pay | Admitting: *Deleted

## 2013-02-01 ENCOUNTER — Encounter (HOSPITAL_COMMUNITY): Payer: Medicare Other | Admitting: Vascular Surgery

## 2013-02-01 ENCOUNTER — Observation Stay (HOSPITAL_COMMUNITY)
Admission: RE | Admit: 2013-02-01 | Discharge: 2013-02-02 | Disposition: A | Discharge status: Home / Self Care | Payer: Medicare Other | Source: Ambulatory Visit | Attending: Surgery | Admitting: Surgery

## 2013-02-01 ENCOUNTER — Ambulatory Visit (HOSPITAL_COMMUNITY): Payer: Medicare Other | Admitting: Anesthesiology

## 2013-02-01 ENCOUNTER — Encounter (HOSPITAL_COMMUNITY)
Admission: RE | Disposition: A | Discharge status: Home / Self Care | Payer: Self-pay | Source: Ambulatory Visit | Attending: Surgery

## 2013-02-01 DIAGNOSIS — I739 Peripheral vascular disease, unspecified: Secondary | ICD-10-CM | POA: Diagnosis not present

## 2013-02-01 DIAGNOSIS — E049 Nontoxic goiter, unspecified: Secondary | ICD-10-CM | POA: Diagnosis present

## 2013-02-01 DIAGNOSIS — M069 Rheumatoid arthritis, unspecified: Secondary | ICD-10-CM | POA: Insufficient documentation

## 2013-02-01 DIAGNOSIS — Z87891 Personal history of nicotine dependence: Secondary | ICD-10-CM | POA: Insufficient documentation

## 2013-02-01 DIAGNOSIS — D34 Benign neoplasm of thyroid gland: Secondary | ICD-10-CM

## 2013-02-01 DIAGNOSIS — H269 Unspecified cataract: Secondary | ICD-10-CM | POA: Insufficient documentation

## 2013-02-01 DIAGNOSIS — I129 Hypertensive chronic kidney disease with stage 1 through stage 4 chronic kidney disease, or unspecified chronic kidney disease: Secondary | ICD-10-CM | POA: Insufficient documentation

## 2013-02-01 DIAGNOSIS — K219 Gastro-esophageal reflux disease without esophagitis: Secondary | ICD-10-CM | POA: Insufficient documentation

## 2013-02-01 DIAGNOSIS — N189 Chronic kidney disease, unspecified: Secondary | ICD-10-CM | POA: Insufficient documentation

## 2013-02-01 DIAGNOSIS — E119 Type 2 diabetes mellitus without complications: Secondary | ICD-10-CM | POA: Diagnosis not present

## 2013-02-01 DIAGNOSIS — E785 Hyperlipidemia, unspecified: Secondary | ICD-10-CM | POA: Diagnosis not present

## 2013-02-01 DIAGNOSIS — E041 Nontoxic single thyroid nodule: Secondary | ICD-10-CM | POA: Diagnosis not present

## 2013-02-01 DIAGNOSIS — E042 Nontoxic multinodular goiter: Secondary | ICD-10-CM | POA: Diagnosis not present

## 2013-02-01 DIAGNOSIS — I10 Essential (primary) hypertension: Secondary | ICD-10-CM | POA: Diagnosis not present

## 2013-02-01 HISTORY — DX: Type 2 diabetes mellitus without complications: E11.9

## 2013-02-01 HISTORY — DX: Rheumatoid arthritis, unspecified: M06.9

## 2013-02-01 HISTORY — PX: THYROIDECTOMY: SHX17

## 2013-02-01 LAB — GLUCOSE, CAPILLARY
GLUCOSE-CAPILLARY: 213 mg/dL — AB (ref 70–99)
GLUCOSE-CAPILLARY: 334 mg/dL — AB (ref 70–99)
Glucose-Capillary: 192 mg/dL — ABNORMAL HIGH (ref 70–99)
Glucose-Capillary: 124 mg/dL — ABNORMAL HIGH (ref 70–99)

## 2013-02-01 SURGERY — THYROIDECTOMY
Anesthesia: General | Site: Neck | Laterality: Right

## 2013-02-01 MED ORDER — LIDOCAINE HCL 4 % MT SOLN
OROMUCOSAL | Status: DC | PRN
  Administered 2013-02-01: 3 mL via TOPICAL

## 2013-02-01 MED ORDER — KCL IN DEXTROSE-NACL 20-5-0.45 MEQ/L-%-% IV SOLN
INTRAVENOUS | Status: DC
  Administered 2013-02-01: 12:00:00 via INTRAVENOUS
  Filled 2013-02-01 (×2): qty 1000

## 2013-02-01 MED ORDER — FINASTERIDE 5 MG PO TABS
5.0000 mg | ORAL_TABLET | Freq: Every day | ORAL | Status: DC
  Administered 2013-02-01 – 2013-02-02 (×2): 5 mg via ORAL
  Filled 2013-02-01 (×2): qty 1

## 2013-02-01 MED ORDER — HYDROMORPHONE HCL PF 1 MG/ML IJ SOLN
1.0000 mg | INTRAMUSCULAR | Status: DC | PRN
  Administered 2013-02-01: 1 mg via INTRAVENOUS
  Filled 2013-02-01: qty 1

## 2013-02-01 MED ORDER — GLYCOPYRROLATE 0.2 MG/ML IJ SOLN
INTRAMUSCULAR | Status: DC | PRN
  Administered 2013-02-01: 0.4 mg via INTRAVENOUS

## 2013-02-01 MED ORDER — METFORMIN HCL 500 MG PO TABS
1000.0000 mg | ORAL_TABLET | Freq: Two times a day (BID) | ORAL | Status: DC
Start: 2013-02-01 — End: 2013-02-01

## 2013-02-01 MED ORDER — NEOSTIGMINE METHYLSULFATE 1 MG/ML IJ SOLN
INTRAMUSCULAR | Status: DC | PRN
  Administered 2013-02-01: 3 mg via INTRAVENOUS

## 2013-02-01 MED ORDER — DEXTROSE 5 % IV SOLN
10.0000 mg | INTRAVENOUS | Status: DC | PRN
  Administered 2013-02-01: 10 ug/min via INTRAVENOUS

## 2013-02-01 MED ORDER — ONDANSETRON HCL 4 MG/2ML IJ SOLN: 4.0000 mg | Freq: Once | INTRAMUSCULAR | Status: DC | PRN

## 2013-02-01 MED ORDER — ACETAMINOPHEN 325 MG PO TABS: 650.0000 mg | ORAL_TABLET | ORAL | Status: DC | PRN

## 2013-02-01 MED ORDER — FUROSEMIDE 40 MG PO TABS
40.0000 mg | ORAL_TABLET | Freq: Every day | ORAL | Status: DC
  Administered 2013-02-01 – 2013-02-02 (×2): 40 mg via ORAL
  Filled 2013-02-01 (×2): qty 1

## 2013-02-01 MED ORDER — FENTANYL CITRATE 0.05 MG/ML IJ SOLN
INTRAMUSCULAR | Status: DC | PRN
  Administered 2013-02-01 (×4): 50 ug via INTRAVENOUS

## 2013-02-01 MED ORDER — TERAZOSIN HCL 5 MG PO CAPS
5.0000 mg | ORAL_CAPSULE | Freq: Every day | ORAL | Status: DC
  Administered 2013-02-01: 5 mg via ORAL
  Filled 2013-02-01 (×2): qty 1

## 2013-02-01 MED ORDER — LIDOCAINE HCL (CARDIAC) 20 MG/ML IV SOLN
INTRAVENOUS | Status: DC | PRN
Start: 2013-02-01 — End: 2013-02-01
  Administered 2013-02-01: 80 mg via INTRAVENOUS

## 2013-02-01 MED ORDER — DILTIAZEM HCL ER COATED BEADS 360 MG PO CP24
360.0000 mg | ORAL_CAPSULE | Freq: Every day | ORAL | Status: DC
  Administered 2013-02-02: 360 mg via ORAL
  Filled 2013-02-01: qty 1

## 2013-02-01 MED ORDER — ONDANSETRON HCL 4 MG/2ML IJ SOLN: 4.0000 mg | Freq: Four times a day (QID) | INTRAMUSCULAR | Status: DC | PRN

## 2013-02-01 MED ORDER — 0.9 % SODIUM CHLORIDE (POUR BTL) OPTIME
TOPICAL | Status: DC | PRN
  Administered 2013-02-01: 1000 mL

## 2013-02-01 MED ORDER — HEMOSTATIC AGENTS (NO CHARGE) OPTIME
TOPICAL | Status: DC | PRN
  Administered 2013-02-01: 1 via TOPICAL

## 2013-02-01 MED ORDER — ONDANSETRON HCL 4 MG/2ML IJ SOLN
INTRAMUSCULAR | Status: DC | PRN
  Administered 2013-02-01 (×2): 4 mg via INTRAVENOUS

## 2013-02-01 MED ORDER — HYDROCODONE-ACETAMINOPHEN 5-325 MG PO TABS
ORAL_TABLET | ORAL | Status: AC
  Filled 2013-02-01: qty 1

## 2013-02-01 MED ORDER — FENTANYL CITRATE 0.05 MG/ML IJ SOLN
25.0000 ug | INTRAMUSCULAR | Status: DC | PRN
  Administered 2013-02-01 (×2): 50 ug via INTRAVENOUS
  Administered 2013-02-01 (×2): 25 ug via INTRAVENOUS

## 2013-02-01 MED ORDER — FAMOTIDINE 20 MG PO TABS
20.0000 mg | ORAL_TABLET | Freq: Every day | ORAL | Status: DC
  Administered 2013-02-01 – 2013-02-02 (×2): 20 mg via ORAL
  Filled 2013-02-01 (×2): qty 1

## 2013-02-01 MED ORDER — FOLIC ACID 1 MG PO TABS
1.0000 mg | ORAL_TABLET | Freq: Every day | ORAL | Status: DC
  Administered 2013-02-01 – 2013-02-02 (×2): 1 mg via ORAL
  Filled 2013-02-01 (×2): qty 1

## 2013-02-01 MED ORDER — SENNA 8.6 MG PO TABS
1.0000 | ORAL_TABLET | Freq: Two times a day (BID) | ORAL | Status: DC
  Administered 2013-02-01 – 2013-02-02 (×3): 8.6 mg via ORAL
  Filled 2013-02-01 (×4): qty 1

## 2013-02-01 MED ORDER — INSULIN ASPART 100 UNIT/ML ~~LOC~~ SOLN
0.0000 [IU] | Freq: Three times a day (TID) | SUBCUTANEOUS | Status: DC
Start: 2013-02-01 — End: 2013-02-02
  Administered 2013-02-01: 11 [IU] via SUBCUTANEOUS
  Administered 2013-02-02: 5 [IU] via SUBCUTANEOUS

## 2013-02-01 MED ORDER — TAMSULOSIN HCL 0.4 MG PO CAPS
0.4000 mg | ORAL_CAPSULE | Freq: Every day | ORAL | Status: DC
Start: 2013-02-01 — End: 2013-02-02
  Administered 2013-02-01 – 2013-02-02 (×2): 0.4 mg via ORAL
  Filled 2013-02-01 (×2): qty 1

## 2013-02-01 MED ORDER — ONDANSETRON HCL 4 MG PO TABS: 4.0000 mg | ORAL_TABLET | Freq: Four times a day (QID) | ORAL | Status: DC | PRN

## 2013-02-01 MED ORDER — LACTATED RINGERS IV SOLN
INTRAVENOUS | Status: DC | PRN
  Administered 2013-02-01: 07:00:00 via INTRAVENOUS

## 2013-02-01 MED ORDER — ARTIFICIAL TEARS OP OINT
TOPICAL_OINTMENT | OPHTHALMIC | Status: DC | PRN
Start: 2013-02-01 — End: 2013-02-01
  Administered 2013-02-01: 1 via OPHTHALMIC

## 2013-02-01 MED ORDER — FENTANYL CITRATE 0.05 MG/ML IJ SOLN
INTRAMUSCULAR | Status: AC
  Filled 2013-02-01: qty 2

## 2013-02-01 MED ORDER — KCL IN DEXTROSE-NACL 20-5-0.45 MEQ/L-%-% IV SOLN
INTRAVENOUS | Status: AC
  Filled 2013-02-01: qty 1000

## 2013-02-01 MED ORDER — TEMAZEPAM 15 MG PO CAPS
15.0000 mg | ORAL_CAPSULE | Freq: Every day | ORAL | Status: DC
  Administered 2013-02-01: 15 mg via ORAL
  Filled 2013-02-01: qty 1

## 2013-02-01 MED ORDER — HYDROCODONE-ACETAMINOPHEN 5-325 MG PO TABS
1.0000 | ORAL_TABLET | ORAL | Status: DC | PRN
  Administered 2013-02-01: 2 via ORAL
  Administered 2013-02-01: 1 via ORAL
  Administered 2013-02-01: 2 via ORAL
  Filled 2013-02-01 (×2): qty 2

## 2013-02-01 MED ORDER — LISINOPRIL 20 MG PO TABS
20.0000 mg | ORAL_TABLET | Freq: Every day | ORAL | Status: DC
  Administered 2013-02-01 – 2013-02-02 (×2): 20 mg via ORAL
  Filled 2013-02-01 (×2): qty 1

## 2013-02-01 MED ORDER — ROCURONIUM BROMIDE 100 MG/10ML IV SOLN
INTRAVENOUS | Status: DC | PRN
  Administered 2013-02-01: 50 mg via INTRAVENOUS

## 2013-02-01 MED ORDER — GABAPENTIN 300 MG PO CAPS
300.0000 mg | ORAL_CAPSULE | Freq: Three times a day (TID) | ORAL | Status: DC
  Administered 2013-02-01 – 2013-02-02 (×3): 300 mg via ORAL
  Filled 2013-02-01 (×5): qty 1

## 2013-02-01 MED ORDER — PROPOFOL 10 MG/ML IV BOLUS
INTRAVENOUS | Status: DC | PRN
  Administered 2013-02-01: 120 mg via INTRAVENOUS

## 2013-02-01 SURGICAL SUPPLY — 50 items
APL SKNCLS STERI-STRIP NONHPOA (GAUZE/BANDAGES/DRESSINGS) ×1
BENZOIN TINCTURE PRP APPL 2/3 (GAUZE/BANDAGES/DRESSINGS) ×3 IMPLANT
BLADE SURG 10 STRL SS (BLADE) ×3 IMPLANT
BLADE SURG 15 STRL LF DISP TIS (BLADE) ×1 IMPLANT
BLADE SURG 15 STRL SS (BLADE) ×3
BLADE SURG ROTATE 9660 (MISCELLANEOUS) ×2 IMPLANT
CANISTER SUCTION 2500CC (MISCELLANEOUS) ×3 IMPLANT
CHLORAPREP W/TINT 10.5 ML (MISCELLANEOUS) ×3 IMPLANT
CLIP TI MEDIUM 24 (CLIP) ×3 IMPLANT
CLIP TI WIDE RED SMALL 24 (CLIP) ×3 IMPLANT
CLOSURE WOUND 1/2 X4 (GAUZE/BANDAGES/DRESSINGS) ×1
CONT SPEC 4OZ CLIKSEAL STRL BL (MISCELLANEOUS) IMPLANT
COVER SURGICAL LIGHT HANDLE (MISCELLANEOUS) ×3 IMPLANT
CRADLE DONUT ADULT HEAD (MISCELLANEOUS) ×3 IMPLANT
DRAPE PED LAPAROTOMY (DRAPES) ×3 IMPLANT
DRAPE UTILITY 15X26 W/TAPE STR (DRAPE) ×6 IMPLANT
ELECT CAUTERY BLADE 6.4 (BLADE) ×3 IMPLANT
ELECT REM PT RETURN 9FT ADLT (ELECTROSURGICAL) ×3
ELECTRODE REM PT RTRN 9FT ADLT (ELECTROSURGICAL) ×1 IMPLANT
GAUZE SPONGE 4X4 16PLY XRAY LF (GAUZE/BANDAGES/DRESSINGS) ×3 IMPLANT
GLOVE BIOGEL PI IND STRL 7.0 (GLOVE) IMPLANT
GLOVE BIOGEL PI IND STRL 7.5 (GLOVE) IMPLANT
GLOVE BIOGEL PI INDICATOR 7.0 (GLOVE) ×6
GLOVE BIOGEL PI INDICATOR 7.5 (GLOVE) ×2
GLOVE SURG ORTHO 8.0 STRL STRW (GLOVE) ×3 IMPLANT
GLOVE SURG SS PI 7.0 STRL IVOR (GLOVE) ×4 IMPLANT
GOWN STRL NON-REIN LRG LVL3 (GOWN DISPOSABLE) ×6 IMPLANT
GOWN STRL REIN XL XLG (GOWN DISPOSABLE) ×3 IMPLANT
HEMOSTAT SURGICEL 2X4 FIBR (HEMOSTASIS) ×3 IMPLANT
KIT BASIN OR (CUSTOM PROCEDURE TRAY) ×3 IMPLANT
KIT ROOM TURNOVER OR (KITS) ×3 IMPLANT
NS IRRIG 1000ML POUR BTL (IV SOLUTION) ×3 IMPLANT
PACK SURGICAL SETUP 50X90 (CUSTOM PROCEDURE TRAY) ×3 IMPLANT
PAD ARMBOARD 7.5X6 YLW CONV (MISCELLANEOUS) ×3 IMPLANT
PENCIL BUTTON HOLSTER BLD 10FT (ELECTRODE) ×3 IMPLANT
SHEARS HARMONIC 9CM CVD (BLADE) ×3 IMPLANT
SPECIMEN JAR MEDIUM (MISCELLANEOUS) ×2 IMPLANT
SPONGE GAUZE 4X4 12PLY (GAUZE/BANDAGES/DRESSINGS) ×3 IMPLANT
SPONGE GAUZE 4X4 12PLY STER LF (GAUZE/BANDAGES/DRESSINGS) ×2 IMPLANT
SPONGE INTESTINAL PEANUT (DISPOSABLE) ×3 IMPLANT
STRIP CLOSURE SKIN 1/2X4 (GAUZE/BANDAGES/DRESSINGS) ×2 IMPLANT
SUT MNCRL AB 4-0 PS2 18 (SUTURE) ×3 IMPLANT
SUT SILK 2 0 (SUTURE) ×3
SUT SILK 2-0 18XBRD TIE 12 (SUTURE) ×1 IMPLANT
SUT VIC AB 3-0 SH 18 (SUTURE) ×5 IMPLANT
SYR BULB 3OZ (MISCELLANEOUS) ×3 IMPLANT
TOWEL OR 17X24 6PK STRL BLUE (TOWEL DISPOSABLE) ×3 IMPLANT
TOWEL OR 17X26 10 PK STRL BLUE (TOWEL DISPOSABLE) ×3 IMPLANT
TUBE CONNECTING 12'X1/4 (SUCTIONS) ×1
TUBE CONNECTING 12X1/4 (SUCTIONS) ×2 IMPLANT

## 2013-02-01 NOTE — Anesthesia Postprocedure Evaluation (Signed)
  Anesthesia Post-op Note  Patient: [name]  Procedure(s) Performed: Procedure(s): RIGHT THYROIDECTOMY LOBECTOMY (Right)  Patient Location: PACU  Anesthesia Type:General  Level of Consciousness: awake, alert  and oriented  Airway and Oxygen Therapy: Patient Spontanous Breathing and Patient connected to nasal cannula oxygen  Post-op Pain: mild  Post-op Assessment: Post-op Vital signs reviewed  Post-op Vital Signs: Reviewed  Complications: No apparent anesthesia complications

## 2013-02-01 NOTE — Preoperative (Signed)
Beta Blockers   Reason not to administer Beta Blockers:Not Applicable 

## 2013-02-01 NOTE — Transfer of Care (Signed)
Immediate Anesthesia Transfer of Care Note  Patient: [name]  Procedure(s) Performed: Procedure(s): RIGHT THYROIDECTOMY LOBECTOMY (Right)  Patient Location: PACU  Anesthesia Type:General  Level of Consciousness: awake, alert  and oriented  Airway & Oxygen Therapy: Patient Spontanous Breathing and Patient connected to nasal cannula oxygen  Post-op Assessment: Report given to PACU RN, Post -op Vital signs reviewed and stable and Patient moving all extremities X 4  Post vital signs: Reviewed and stable  Complications: No apparent anesthesia complications

## 2013-02-01 NOTE — Progress Notes (Signed)
PHARMACIST - PHYSICIAN COMMUNICATION  CONCERNING:  METFORMIN SAFE ADMINISTRATION POLICY  RECOMMENDATION: Metformin has been placed on DISCONTINUE (rejected order) STATUS and should be reordered only after any of the conditions below are ruled out.  Current safety recommendations include avoiding metformin for a minimum of 48 hours after the patient's exposure to intravenous contrast media.  DESCRIPTION:  The Pharmacy Committee has adopted a policy that restricts the use of metformin in hospitalized patients until all the contraindications to administration have been ruled out. Specific contraindications are: [x]  Serum creatinine ? 1.5 for males []  Serum creatinine ? 1.4 for females []  Shock, acute MI, sepsis, hypoxemia, dehydration []  Planned administration of intravenous iodinated contrast media []  Heart Failure patients with low EF []  Acute or chronic metabolic acidosis (including DKA)      Hughes Better, PharmD, BCPS Clinical Pharmacist 02/01/2013 1:08 PM

## 2013-02-01 NOTE — Brief Op Note (Signed)
02/01/2013  9:35 AM  PATIENT:  Irena Cords  80 y.o. male  PRE-OPERATIVE DIAGNOSIS:  multinodular thyroid   POST-OPERATIVE DIAGNOSIS:  multinodular thyroid   PROCEDURE:  Procedure(s): RIGHT THYROIDECTOMY LOBECTOMY (Right)  SURGEON:  Surgeon(s) and Role:    * Earnstine Regal, MD - Primary  ANESTHESIA:   general  EBL:  Total I/O In: 700 [I.V.:700] Out: 50 [Blood:50]  BLOOD ADMINISTERED:none  DRAINS: none   LOCAL MEDICATIONS USED:  NONE  SPECIMEN:  Excision  DISPOSITION OF SPECIMEN:  PATHOLOGY  COUNTS:  YES  TOURNIQUET:  * No tourniquets in log *  DICTATION: .Other Dictation: Dictation Number [id number]  PLAN OF CARE: Admit for overnight observation  PATIENT DISPOSITION:  PACU - hemodynamically stable.   Delay start of Pharmacological VTE agent (>24hrs) due to surgical blood loss or risk of bleeding: yes  Earnstine Regal, MD, [facility] Office: [phone]

## 2013-02-01 NOTE — Interval H&P Note (Signed)
History and Physical Interval Note:  02/01/2013 7:20 AM  [name]  has presented today for surgery, with the diagnosis of multinodular thyroid.   The various methods of treatment have been discussed with the patient and family. After consideration of risks, benefits and other options for treatment, the patient has consented to    Procedure(s): THYROIDECTOMY lobectomy (Right) as a surgical intervention .    The patient's history has been reviewed, patient examined, no change in status, stable for surgery.  I have reviewed the patient's chart and labs.  Questions were answered to the patient's satisfaction.    Earnstine Regal, MD, [facility] Office: [place]

## 2013-02-01 NOTE — Anesthesia Preprocedure Evaluation (Signed)
Anesthesia Evaluation  Patient identified by MRN, date of birth, ID band Patient awake    Reviewed: Allergy & Precautions, H&P , NPO status , Patient's Chart, lab work & pertinent test results  Airway Mallampati: I TM Distance: >3 FB Neck ROM: Full    Dental  (+) Upper Dentures, Lower Dentures and Dental Advisory Given   Pulmonary former smoker,  breath sounds clear to auscultation        Cardiovascular hypertension, Pt. on medications Rhythm:Regular Rate:Normal     Neuro/Psych    GI/Hepatic GERD-  Medicated and Controlled,  Endo/Other  diabetes, Well Controlled, Type 2, Oral Hypoglycemic AgentsMorbid obesity  Renal/GU      Musculoskeletal   Abdominal   Peds  Hematology   Anesthesia Other Findings   Reproductive/Obstetrics                           Anesthesia Physical Anesthesia Plan  ASA: III  Anesthesia Plan: General   Post-op Pain Management:    Induction: Intravenous  Airway Management Planned: Oral ETT  Additional Equipment:   Intra-op Plan:   Post-operative Plan: Extubation in OR  Informed Consent: I have reviewed the patients History and Physical, chart, labs and discussed the procedure including the risks, benefits and alternatives for the proposed anesthesia with the patient or authorized representative who has indicated his/her understanding and acceptance.   Dental advisory given  Plan Discussed with: CRNA, Anesthesiologist and Surgeon  Anesthesia Plan Comments:         Anesthesia Quick Evaluation

## 2013-02-01 NOTE — Anesthesia Procedure Notes (Signed)
Procedure Name: Intubation Date/Time: 02/01/2013 7:43 AM Performed by: Erik Obey Pre-anesthesia Checklist: Patient identified, Timeout performed, Emergency Drugs available, Suction available and Patient being monitored Patient Re-evaluated:Patient Re-evaluated prior to inductionOxygen Delivery Method: Circle system utilized Preoxygenation: Pre-oxygenation with 100% oxygen Intubation Type: IV induction Ventilation: Mask ventilation without difficulty and Oral airway inserted - appropriate to patient size Laryngoscope Size: Mac and 3 Grade View: Grade I Tube type: Oral Tube size: 7.5 mm Number of attempts: 1 Airway Equipment and Method: Stylet Placement Confirmation: ETT inserted through vocal cords under direct vision,  positive ETCO2 and breath sounds checked- equal and bilateral Secured at: 22 cm Tube secured with: Tape Dental Injury: Teeth and Oropharynx as per pre-operative assessment

## 2013-02-01 NOTE — Op Note (Signed)
NAME:  BLAIR, LUNDEEN NO.:  [id number]  MEDICAL RECORD NO.:  [id number]  LOCATION:  [facility]                         FACILITY:  [facility]  PHYSICIAN:  Earnstine Regal, MD      DATE OF BIRTH:  1930-09-19  DATE OF PROCEDURE:  02/01/2013                               OPERATIVE REPORT   PREOPERATIVE DIAGNOSIS:  Right thyroid nodule.  POSTOPERATIVE DIAGNOSIS:  Right thyroid nodule.  PROCEDURE:  Right thyroid lobectomy.  SURGEON:  Earnstine Regal, MD, FACS  ANESTHESIA:  General per Lorrene Reid, MD  ESTIMATED BLOOD LOSS:  Minimal.  PREPARATION:  ChloraPrep.  COMPLICATIONS:  None.  INDICATIONS:  The patient is an 80 year old male referred by his endocrinologist for dominant right-sided thyroid nodule with compressive symptoms.  Ultrasound demonstrated a 5.2-cm dominant nodule, as well as a second 2.2-cm nodule, both in the right thyroid lobe.  The patient had a barium swallow showing extrinsic compression of the cervical esophagus.  He has had moderate dysphagia, particularly with solid foods.  He now comes to Surgery for resection for definitive diagnosis.  BODY OF REPORT:  Procedures done in OR #2 at the [place].  The patient was brought to the operating room, placed in supine position on the operating room table.  Following administration of general anesthesia, the patient was positioned and then prepped and draped in the usual aseptic fashion.  After ascertaining that an adequate level of anesthesia had been achieved, a Kocher incision was made with a #10 blade.  Dissection was carried through subcutaneous tissues and platysma.  Hemostasis was achieved with electrocautery. Skin flaps were elevated cephalad and caudad from the thyroid notch to the sternal notch.  The Mahorner self-retaining retractor was placed for exposure.  Strap muscles were incised in the midline.  Left thyroid lobe was briefly exposed.  It was palpated.  It appears  grossly normal.  Next, strap muscles were reflected to the right exposing an enlarged right thyroid lobe.  Venous tributaries were divided between Ligaclips with the Harmonic scalpel.  Superior pole vessels were dissected out individually and divided between small and medium Ligaclips with the Harmonic scalpel.  Gland was mobilized and rolled anteriorly.  Inferior venous tributaries were divided between Ligaclips with the Harmonic scalpel.  Branches of the inferior thyroid artery were divided between small Ligaclips with the Harmonic scalpel.  Gland was rolled anteriorly. Parathyroid tissue in the recurrent nerve are preserved.  Ligament of Gwenlyn Found was released with the electrocautery.  Gland was mobilized onto the anterior trachea.  A small pyramidal lobe was resected with the isthmus.  The thyroid tissue was divided at the junction of the isthmus and left thyroid lobe using the Harmonic scalpel for hemostasis. Specimen was submitted to Pathology for review.  Neck was irrigated with warm saline.  Good hemostasis was achieved throughout.  Fibrillar was placed throughout the operative field.  Strap muscles were reapproximated in the midline with interrupted 3-0 Vicryl sutures.  Platysma was closed with interrupted 3-0 Vicryl sutures.  Skin was closed with a running 4-0 Monocryl subcuticular suture.  Wound was washed and dried.  Benzoin and Steri-Strips were applied.  Sterile dressings were applied.  The patient was awakened from anesthesia and brought to the recovery room.  The patient tolerated the procedure well.   Earnstine Regal, MD, [facility] Office: [phone]   TMG/MEDQ  D:  02/01/2013  T:  02/01/2013  Job:  [id number]  cc:   Philemon Kingdom, M.D. Dr. [name]

## 2013-02-01 NOTE — H&P (View-Only) (Signed)
General Surgery [facility]  Chief Complaint  Patient presents with  . New Evaluation    thyroid goiter with compressive symptoms - referral from Dr. Philemon Kingdom    HISTORY: Patient is an 80 year old male referred by his endocrinologist for evaluation for right thyroid lobectomy. Patient was diagnosed in 2004 with right-sided thyroid nodules. He had developed dysphagia. Apparently, the dominant nodule was cystic it that time and responded to aspiration.  However, in recent months, the patient has noted progressive dysphagia, particularly of solid foods. Repeat ultrasound showed a 5.2 cm dominant nodule in the right lobe and a 2.2 cm nodule in the upper pole of the right lobe. Patient underwent fine-needle aspiration biopsy which showed findings consistent with benign follicular nodule. No evidence of malignancy was identified.  Patient underwent barium swallow. This did demonstrate an extrinsic compression of the cervical esophagus, suspicious for compression by the thyroid.  Patient has had no prior head or neck surgery. He has never been on thyroid medication. His most recent TSH level was 0.37. There is no family history of thyroid disease and specifically no history of thyroid cancer. There is no family history of endocrine neoplasm.  Past Medical History  Diagnosis Date  . Hypertension   . Pneumonia   . Diabetes mellitus   . GERD (gastroesophageal reflux disease)   . Arthritis     rheumatoid  . Cataracts, bilateral   . Anemia   . Hyperlipidemia   . Chronic kidney disease     Current Outpatient Prescriptions  Medication Sig Dispense Refill  . diltiazem (CARDIZEM CD) 360 MG 24 hr capsule Take 360 mg by mouth daily.        [name] Sodium (DSS) 100 MG CAPS Take 100 mg by mouth 2 (two) times daily.      . finasteride (PROSCAR) 5 MG tablet Take 5 mg by mouth daily.      . folic acid (FOLVITE) 1 MG tablet Take 1 mg by mouth daily.        . furosemide  (LASIX) 40 MG tablet Take 40 mg by mouth daily.      [name] gabapentin (NEURONTIN) 300 MG capsule Take 1 capsule (300 mg total) by mouth 3 (three) times daily.  15 capsule  0  . HYDROcodone-acetaminophen (VICODIN) 5-500 MG per tablet Take 1 tablet by mouth every 6 (six) hours as needed. For pain      . lisinopril (PRINIVIL,ZESTRIL) 20 MG tablet Take 20 mg by mouth daily.        . metFORMIN (GLUCOPHAGE) 1000 MG tablet Take 1,000 mg by mouth 2 (two) times daily with a meal.        . methotrexate (RHEUMATREX) 2.5 MG tablet Take 25 mg by mouth once a week.      . pravastatin (PRAVACHOL) 40 MG tablet Take 40 mg by mouth daily.      . ranitidine (ZANTAC) 150 MG tablet Take 150 mg by mouth 2 (two) times daily.        [name] senna (SENOKOT) 8.6 MG TABS Take 1 tablet by mouth 2 (two) times daily.      . Tamsulosin HCl (FLOMAX) 0.4 MG CAPS Take 0.4 mg by mouth daily.      . temazepam (RESTORIL) 15 MG capsule Take 15 mg by mouth at bedtime.        [name] terazosin (HYTRIN) 5 MG capsule Take 5 mg by mouth at bedtime.         No current  facility-administered medications for this visit.    No Known Allergies  Family History  Problem Relation Age of Onset  . Cancer Mother     breast    History   Social History  . Marital Status: Married    Spouse Name: N/A    Number of Children: N/A  . Years of Education: N/A   Social History Main Topics  . Smoking status: Former Smoker -- 1.00 packs/day for 15 years    Types: Cigarettes    Quit date: 09/25/1965  . Smokeless tobacco: None     Comment: quit smoking in 1967  . Alcohol Use: No     Comment: ocassional wine, beer or liquor  . Drug Use: No  . Sexual Activity: None   Other Topics Concern  . None   Social History Narrative  . None    REVIEW OF SYSTEMS - PERTINENT POSITIVES ONLY: Solid food dysphagia, moderate to severe. Mild dysphagia with liquids. No history of aspiration. No pain. Denies tremor. Denies palpitations.  EXAM: Filed Vitals:   01/11/13  1352  BP: 142/74  Pulse: 88  Temp: 97.6 F (36.4 C)  Resp: 16    GENERAL: well-developed, well-nourished, no acute distress HEENT: normocephalic; pupils equal and reactive; sclerae clear; dentition good; mucous membranes moist NECK:  Palpable dominant mass in the mid and lower portion of the right thyroid lobe extending beneath the clavicle; left lobe without palpable nodules; asymmetric on extension; no palpable anterior or posterior cervical lymphadenopathy; no supraclavicular masses; no tenderness CHEST: clear to auscultation bilaterally without rales, rhonchi, or wheezes CARDIAC: regular rate and rhythm without significant murmur; peripheral pulses are full EXT:  non-tender without edema; no deformity NEURO: no gross focal deficits; no sign of tremor   LABORATORY RESULTS: See [facility] (CHL-Epic) for most recent results  RADIOLOGY RESULTS: See [facility] (CHL-Epic) for most recent results  IMPRESSION: Multinodular thyroid goiter, right lobe, with compressive symptoms  PLAN: I discussed the above findings at length with the patient and his daughter. We discussed right thyroid lobectomy for removal of the dominant nodules and relief of his compressive symptoms. We discussed the potential need for thyroid hormone supplementation. We discussed potential complications including injury to recurrent laryngeal nerves or to parathyroid glands. We discussed the potential need for total thyroidectomy if there were any suspicious findings on the left side of the gland at the time of surgery. We discussed his hospital stay and his postoperative recovery. They understand and wish to proceed with surgery. They request [facility]. We will make arrangements at a time convenient for the patient in the near future.  The risks and benefits of the procedure have been discussed at length with the patient.  The patient understands the proposed procedure, potential alternative treatments, and  the course of recovery to be expected.  All of the patient's questions have been answered at this time.  The patient wishes to proceed with surgery.  Earnstine Regal, MD, [facility]  Primary Care Physician: [name], MD

## 2013-02-02 LAB — GLUCOSE, CAPILLARY: GLUCOSE-CAPILLARY: 248 mg/dL — AB (ref 70–99)

## 2013-02-02 MED ORDER — HYDROCODONE-ACETAMINOPHEN 5-500 MG PO TABS: 1.0000 | ORAL_TABLET | Freq: Four times a day (QID) | ORAL | Status: DC | PRN

## 2013-02-02 MED ORDER — HYDROCODONE-ACETAMINOPHEN 5-325 MG PO TABS: 1.0000 | ORAL_TABLET | ORAL | Status: DC | PRN

## 2013-02-02 NOTE — Progress Notes (Signed)
Discharge instructions reviewed with patient. Wife and and daughter present. Family able to answer teach-back questions. RN answered question re: next dose of pain medicine. Printed AVS and prescription given to family per patient's request. Patient discharged to home via wheelchair.

## 2013-02-02 NOTE — Progress Notes (Signed)
Prescription for Vicodin found in patients room after discharge. Wife notified. She will call daughter to arrange pick-up of prescription.

## 2013-02-02 NOTE — Discharge Summary (Signed)
Physician Discharge Summary [facility]  Patient ID: [name] MRN: [id number] DOB/AGE: Mar 28, 1930 80 y.o.  Admit date: 02/01/2013 Discharge date: 02/02/2013  Admission Diagnoses:  Thyroid nodules with compressive symptoms  Discharge Diagnoses:  Principal Problem:   Right thyroid nodule Active Problems:   Thyroid goiter   Discharged Condition: good  Hospital Course: Patient was admitted for observation following thyroid surgery.  Post op course was uncomplicated.  Pain was well controlled.  Tolerated diet.  Post op calcium level on morning following surgery was not required.  Patient was prepared for discharge home on POD#1.  Consults: None  Significant Diagnostic Studies: labs: none  Treatments: surgery: right thyroid lobectomy  Discharge Exam: Blood pressure 91/47, pulse 91, temperature 97.5 F (36.4 C), temperature source Oral, resp. rate 18, height 5\' 8"  (1.727 m), weight 216 lb 4.3 oz (98.1 kg), SpO2 93.00%. HEENT - clear Neck - soft, mild ecchymosis; wound clear and dry; voice slightly hoarse, no stridor Chest - clear bilaterally Cor - RRR  Disposition: Home with family  Discharge Orders   Future Appointments Provider Department Dept Phone   02/09/2013 11:30 AM Earnstine Regal, MD [facility] [place] [phone]   Future Orders Complete By Expires   Apply dressing  As directed    Scheduling Instructions:     Apply light gauze dressing to wound before discharge home today.   Diet - low sodium heart healthy  As directed    Discharge instructions  As directed    Comments:     THYROID & PARATHYROID SURGERY - POST OP INSTRUCTIONS  Always review your discharge instruction sheet from the facility where your surgery was performed.  A prescription for pain medication may be given to you upon discharge.  Take your pain medication as prescribed.  If narcotic pain medicine is not needed, then you may take acetaminophen (Tylenol) or ibuprofen  (Advil) as needed.  Take your usually prescribed medications unless otherwise directed.  If you need a refill on your pain medication, please contact your pharmacy. They will contact our office to request authorization.  Prescriptions will not be processed after 5 pm or on weekends.  Start with a light diet upon arrival home, such as soup and crackers or toast.  Be sure to drink plenty of fluids daily.  Resume your normal diet the day after surgery.  Most patients will experience some swelling and bruising on the chest and neck area.  Ice packs will help.  Swelling and bruising can take several days to resolve.   It is common to experience some constipation if taking pain medication after surgery.  Increasing fluid intake and taking a stool softener will usually help or prevent this problem.  A mild laxative (Milk of Magnesia or Miralax) should be taken according to package directions if there are no bowel movements after 48 hours.  You may remove your bandages 24-48 hours after surgery, and you may shower at that time.  You have steri-strips (small skin tapes) in place directly over the incision.  These strips should be left on the skin for 7-10 days and then removed.  You may resume regular (light) daily activities beginning the next day-such as daily self-care, walking, climbing stairs-gradually increasing activities as tolerated.  You may have sexual intercourse when it is comfortable.  Refrain from any heavy lifting or straining until approved by your doctor.  You may drive when you no longer are taking prescription pain medication, you can comfortably wear a  seatbelt, and you can safely maneuver your car and apply brakes.  You should see your doctor in the office for a follow-up appointment approximately two to three weeks after your surgery.  Make sure that you call for this appointment within a day or two after you arrive home to insure a convenient appointment time.  WHEN TO CALL YOUR  DOCTOR: -- Fever greater than 101.5 -- Inability to urinate -- Nausea and/or vomiting - persistent -- Extreme swelling or bruising -- Continued bleeding from incision -- Increased pain, redness, or drainage from the incision -- Difficulty swallowing or breathing -- Muscle cramping or spasms -- Numbness or tingling in hands or around lips  The clinic staff is available to answer your questions during regular business hours.  Please don't hesitate to call and ask to speak to one of the nurses if you have concerns.  Earnstine Regal, MD, [facility] Office: [phone]   Increase activity slowly  As directed    Remove dressing in 24 hours  As directed        Medication List         diltiazem 360 MG 24 hr capsule  Commonly known as:  CARDIZEM CD  Take 360 mg by mouth daily.     DSS 100 MG Caps  Take 100 mg by mouth 2 (two) times daily.     finasteride 5 MG tablet  Commonly known as:  PROSCAR  Take 5 mg by mouth daily.     folic acid 1 MG tablet  Commonly known as:  FOLVITE  Take 1 mg by mouth daily.     furosemide 40 MG tablet  Commonly known as:  LASIX  Take 40 mg by mouth daily.     gabapentin 300 MG capsule  Commonly known as:  NEURONTIN  Take 1 capsule (300 mg total) by mouth 3 (three) times daily.     HYDROcodone-acetaminophen 5-325 MG per tablet  Commonly known as:  NORCO/VICODIN  Take 1-2 tablets by mouth every 4 (four) hours as needed for moderate pain.     HYDROcodone-acetaminophen 5-500 MG per tablet  Commonly known as:  VICODIN  Take 1 tablet by mouth every 6 (six) hours as needed. For pain     lisinopril 20 MG tablet  Commonly known as:  PRINIVIL,ZESTRIL  Take 20 mg by mouth daily.     metFORMIN 1000 MG tablet  Commonly known as:  GLUCOPHAGE  Take 1,000 mg by mouth 2 (two) times daily with a meal.     Methotrexate (PF) 25 MG/0.4ML Soaj  Inject 25 mg into the skin once a week.     pravastatin  40 MG tablet  Commonly known as:  PRAVACHOL  Take 40 mg by mouth daily.     ranitidine 150 MG tablet  Commonly known as:  ZANTAC  Take 150 mg by mouth 2 (two) times daily.     REMICADE IV  Inject into the vein every 6 (six) weeks. Dr Ouida Sills.     senna 8.6 MG Tabs tablet  Commonly known as:  SENOKOT  Take 1 tablet by mouth 2 (two) times daily.     tamsulosin 0.4 MG Caps capsule  Commonly known as:  FLOMAX  Take 0.4 mg by mouth daily.     temazepam 15 MG capsule  Commonly known as:  RESTORIL  Take 15 mg by mouth at bedtime.     terazosin 5 MG capsule  Commonly known as:  HYTRIN  Take 5 mg by mouth at bedtime.           Follow-up Information   Follow up with Earnstine Regal, MD. Schedule an appointment as soon as possible for a visit in 2 weeks.   Specialty:  General Surgery   Contact information:   [address] [place] [address] [phone]       Earnstine Regal, MD, [facility] Office: [phone]   Signed: Earnstine Regal 02/02/2013, 8:23 AM

## 2013-02-03 ENCOUNTER — Telehealth (INDEPENDENT_AMBULATORY_CARE_PROVIDER_SITE_OTHER): Payer: Self-pay

## 2013-02-03 ENCOUNTER — Encounter (HOSPITAL_COMMUNITY): Payer: Self-pay | Admitting: Surgery

## 2013-02-03 NOTE — Telephone Encounter (Signed)
Pt home doing well. PO appt made. 

## 2013-02-04 ENCOUNTER — Telehealth (INDEPENDENT_AMBULATORY_CARE_PROVIDER_SITE_OTHER): Payer: Self-pay

## 2013-02-04 NOTE — Telephone Encounter (Signed)
Pt notified path is benign adenoma. Pt will keep PO appt.

## 2013-02-07 NOTE — Progress Notes (Signed)
Quick Note:  Please contact patient and notify of benign pathology results.  Breylin Dom M. Sly Parlee, MD, FACS [facility] Office: [phone]   ______ 

## 2013-02-09 ENCOUNTER — Ambulatory Visit (INDEPENDENT_AMBULATORY_CARE_PROVIDER_SITE_OTHER): Payer: Medicare Other | Admitting: Surgery

## 2013-02-09 ENCOUNTER — Encounter (INDEPENDENT_AMBULATORY_CARE_PROVIDER_SITE_OTHER): Payer: Self-pay | Admitting: Surgery

## 2013-02-09 VITALS — BP 160/92 | HR 97 | Temp 98.2°F | Resp 15 | Ht 68.0 in | Wt 218.6 lb

## 2013-02-09 DIAGNOSIS — E041 Nontoxic single thyroid nodule: Secondary | ICD-10-CM

## 2013-02-09 NOTE — Patient Instructions (Signed)
  COCOA BUTTER & VITAMIN E CREAM  (Palmer's or other brand)  Apply cocoa butter/vitamin E cream to your incision 2 - 3 times daily.  Massage cream into incision for one minute with each application.  Use sunscreen (50 SPF or higher) for first 6 months after surgery if area is exposed to sun.  You may substitute Mederma or other scar reducing creams as desired.   

## 2013-02-09 NOTE — Progress Notes (Signed)
General Surgery [facility]  Chief Complaint  Patient presents with  . Routine Post Op    right thyroid lobectomy 02/01/2013    HISTORY: Patient is an 80 year old gentleman who underwent right thyroid lobectomy. 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 There was no evidence of malignancy. Postoperative course has been uneventful. He is doing very well.  EXAM: Incision is healing nicely. Mild soft tissue swelling. No sign of infection. Steri-Strips remain in place. Voice quality is normal.  IMPRESSION: Status post right thyroid lobectomy for follicular adenoma  PLAN: Patient and I reviewed the pathology report and I gave him a copy of the report to take with him. He will begin applying topical creams to his incision. He will return in 6 weeks for final wound check. We will check a TSH level prior to that office visit.  Earnstine Regal, MD, [facility]   Visit Diagnoses: 1. Right thyroid nodule

## 2013-02-09 NOTE — Addendum Note (Signed)
Addended by: Dois Davenport on: 02/09/2013 12:05 PM   Modules accepted: Orders

## 2013-02-16 DIAGNOSIS — M069 Rheumatoid arthritis, unspecified: Secondary | ICD-10-CM | POA: Diagnosis not present

## 2013-03-02 DIAGNOSIS — M069 Rheumatoid arthritis, unspecified: Secondary | ICD-10-CM | POA: Diagnosis not present

## 2013-03-11 DIAGNOSIS — E041 Nontoxic single thyroid nodule: Secondary | ICD-10-CM | POA: Diagnosis not present

## 2013-03-11 LAB — TSH: TSH: 2.934 u[IU]/mL (ref 0.350–4.500)

## 2013-03-14 NOTE — Progress Notes (Signed)
Quick Note:  TSH within normal range. No need to start supplemental thyroid hormone at this time.  Earnstine Regal, MD, [facility] Office: [phone]    ______

## 2013-03-18 ENCOUNTER — Telehealth (INDEPENDENT_AMBULATORY_CARE_PROVIDER_SITE_OTHER): Payer: Self-pay

## 2013-03-18 NOTE — Telephone Encounter (Signed)
Pt notified of lab results per Dr Gala Lewandowsky request..

## 2013-03-23 ENCOUNTER — Ambulatory Visit (INDEPENDENT_AMBULATORY_CARE_PROVIDER_SITE_OTHER): Payer: Medicare Other | Admitting: Surgery

## 2013-03-23 ENCOUNTER — Encounter (INDEPENDENT_AMBULATORY_CARE_PROVIDER_SITE_OTHER): Payer: Self-pay | Admitting: Surgery

## 2013-03-23 VITALS — BP 136/70 | HR 72 | Temp 98.8°F | Resp 14 | Ht 68.0 in | Wt 218.4 lb

## 2013-03-23 DIAGNOSIS — E041 Nontoxic single thyroid nodule: Secondary | ICD-10-CM

## 2013-03-23 NOTE — Progress Notes (Signed)
General Surgery [facility]  Chief Complaint  Patient presents with  . Routine Post Op    right thyroid nodule, lobectomy    HISTORY: Patient is an 80 year old male who underwent right thyroid lobectomy for a right thyroid nodule. Postoperatively he has done well. He has not been started on thyroid hormone supplementation. TSH level is in the normal range at 2.934.  EXAM: Surgical incision is healed nicely. Mild soft tissue edema remains. This will resolve. No sign of infection. No sign of seroma. Voice quality is normal.  IMPRESSION: Status post right thyroid lobectomy with benign final pathology  PLAN: Patient is doing well. He will continue to apply topical creams to his incision. He does not need to take thyroid hormone supplementation at this point. He should have his TSH level checked by his primary care physician at his next office visit.  Patient will return for surgical care as needed.  Earnstine Regal, MD, [facility]   Visit Diagnoses: 1. Right thyroid nodule

## 2013-03-23 NOTE — Patient Instructions (Signed)
  COCOA BUTTER & VITAMIN E CREAM  (Palmer's or other brand)  Apply cocoa butter/vitamin E cream to your incision 2 - 3 times daily.  Massage cream into incision for one minute with each application.  Use sunscreen (50 SPF or higher) for first 6 months after surgery if area is exposed to sun.  You may substitute Mederma or other scar reducing creams as desired.   

## 2013-03-30 DIAGNOSIS — M069 Rheumatoid arthritis, unspecified: Secondary | ICD-10-CM | POA: Diagnosis not present

## 2013-04-12 DIAGNOSIS — N183 Chronic kidney disease, stage 3 unspecified: Secondary | ICD-10-CM | POA: Diagnosis not present

## 2013-04-12 DIAGNOSIS — I129 Hypertensive chronic kidney disease with stage 1 through stage 4 chronic kidney disease, or unspecified chronic kidney disease: Secondary | ICD-10-CM | POA: Diagnosis not present

## 2013-04-12 DIAGNOSIS — D638 Anemia in other chronic diseases classified elsewhere: Secondary | ICD-10-CM | POA: Diagnosis not present

## 2013-04-12 DIAGNOSIS — E1129 Type 2 diabetes mellitus with other diabetic kidney complication: Secondary | ICD-10-CM | POA: Diagnosis not present

## 2013-04-26 DIAGNOSIS — I129 Hypertensive chronic kidney disease with stage 1 through stage 4 chronic kidney disease, or unspecified chronic kidney disease: Secondary | ICD-10-CM | POA: Diagnosis not present

## 2013-05-11 DIAGNOSIS — M069 Rheumatoid arthritis, unspecified: Secondary | ICD-10-CM | POA: Diagnosis not present

## 2013-06-13 ENCOUNTER — Ambulatory Visit (INDEPENDENT_AMBULATORY_CARE_PROVIDER_SITE_OTHER): Payer: Medicare Other | Admitting: Internal Medicine

## 2013-06-13 ENCOUNTER — Encounter: Payer: Self-pay | Admitting: Internal Medicine

## 2013-06-13 VITALS — BP 110/62 | HR 82 | Temp 98.1°F | Resp 12 | Wt 220.0 lb

## 2013-06-13 DIAGNOSIS — E041 Nontoxic single thyroid nodule: Secondary | ICD-10-CM | POA: Diagnosis not present

## 2013-06-13 DIAGNOSIS — R946 Abnormal results of thyroid function studies: Secondary | ICD-10-CM | POA: Diagnosis not present

## 2013-06-13 DIAGNOSIS — E049 Nontoxic goiter, unspecified: Secondary | ICD-10-CM | POA: Diagnosis not present

## 2013-06-13 DIAGNOSIS — R7989 Other specified abnormal findings of blood chemistry: Secondary | ICD-10-CM

## 2013-06-13 LAB — T3, FREE: T3, Free: 2.9 pg/mL (ref 2.3–4.2)

## 2013-06-13 LAB — TSH: TSH: 1.42 u[IU]/mL (ref 0.35–4.50)

## 2013-06-13 LAB — T4, FREE: Free T4: 1.19 ng/dL (ref 0.60–1.60)

## 2013-06-13 NOTE — Patient Instructions (Signed)
Please return in 1 year, but we will most likely need to check labs before that >> I will let you know about the labs through my chart.  Please stop at the lab.

## 2013-06-13 NOTE — Progress Notes (Signed)
Patient ID: [name], male   DOB: Aug 26, 1930, 80 y.o.   MRN: [id number]  HPI: [name] is a 80 y.o.-year-old male, returning for f/u for for his MNG. He is here with his wife and daughter. Last visit 6 mo ago  Reviewed hx: Pt was first found to have thyroid nodules in 2004. At that time, he had a thyroid U/S and then had a right thyroid nodule drained by Dr. Theda Sers (who since then retired). He remembers that he had neck compression symptoms at that time and he got symptomatic relief after having the nodule drained. It is unclear how large was the nodule or if this was a cyst.  He restarted to feel the nodule again ~2 years ago.  He started to feel that the food gets stuck in his throat and occasionally he needs to spit it out/vomit; also has occasional hoarseness, especially when he talks more >> Ba swallow shoed R thyroid compression on Es >> I referred him to Sx >> Right thyroidectomy 02/01/2013  Reviewed latest thyroid tests - they remained normal after his lobectomy: Lab Results  Component Value Date   TSH 2.934 03/11/2013   TSH 0.37 05/18/2012   FREET4 1.28 05/18/2012  FT3 also normal  Denies: - no weight changes - no palpitations - no tremors - no anxiety - no heat/cold intolerance   He has RA - he is on Remicade q6w.  I reviewed pt's medications, allergies, PMH, social hx, family hx and no changes required, except as mentioned above, except stopped Lasix.   ROS: Constitutional: no weight gain/loss, no fatigue, no subjective hyperthermia/hypothermia, + nocturia Eyes: no blurry vision, no xerophthalmia ENT: no sore throat, no nodules palpated in throat, + dysphagia/no odynophagia, + hoarseness; hypoacusis - has hearing aids Cardiovascular: no CP/SOB/palpitations/+ leg swelling - Left ankle since his ankle surgery in 09/2011 Respiratory: + cough/no SOB/+ wheezing Gastrointestinal: no N/V/D/C Musculoskeletal: no muscle pain/+ joint aches and swelling Skin: no  rashes  PE: BP 110/62  Pulse 82  Temp(Src) 98.1 F (36.7 C) (Oral)  Resp 12  Wt 220 lb (99.791 kg)  SpO2 97% Wt Readings from Last 3 Encounters:  06/13/13 220 lb (99.791 kg)  03/23/13 218 lb 6.4 oz (99.066 kg)  02/09/13 218 lb 9.6 oz (99.156 kg)   Constitutional: overweight, in NAD Eyes: PERRLA, EOMI, no exophthalmos ENT: moist mucous membranes, thyroid scar healed, no masses palpable, no cervical lymphadenopathy Cardiovascular: RRR, No MRG Respiratory: CTA B Gastrointestinal: abdomen soft, NT, ND, BS+ Musculoskeletal: no deformities, strength intact in all 4; left ankle edema  Skin: moist, warm, no rashes Neurological: very fine tremor with outstretched hands, DTR normal in all 4  ASSESSMENT: 1. Right thyroid nodule - diagnosed in 2004, previously drained - Associated with neck compression symptoms - 05/25/2012: Thyroid U/S:  Right thyroid lobe: 7.5 x 3.5 x 4.2 cm. Heterogeneous echogenicity.   Left thyroid lobe: 4.5 x 1.4 x 1.7 cm. Heterogeneous echogenicity.   Isthmus: 4 mm thick   Focal nodules: Dominant mass occupying majority of the mid to inferior right lobe 5.2 x 3.5 x 3.8 cm. Mass contains tiny calcifications and is heterogeneous in echogenicity. Additional solid nodule at upper pole right lobe 1.7 x 2.2 x 1.4 cm. Additional sub centimeter nodules in left lobe and isthmus.   Lymphadenopathy: None identified  - 06/04/2012: FNA results: both nodules benign - 12/07/2012: Ba esophagogram:   1. Episode of aspiration.  2. Mild impression on the right lateral cervical esophagus, likely  secondary to known goiter. No evidence of significant fixed  esophageal narrowing.  3. Small sliding hiatal hernia.  4. Prominent tertiary waves.  - Right thyroidectomy 02/01/2013  2. Low TSH - resolved - 04/08/2012: TSH 0.248 (0.34-3.77) - 05/18/2012: TSH 0.37, fT4 1.28, fT3 2.7- Patient clinically euthyroid  He goes to the [facility] in [place].  Labs (04/08/2012): - lipid  panel: 126/136/40/82 - CBC normal except, hemoglobin 10.7 (13.11-16.01), hematocrit 30.6% (40.5-48.5), RBCs 3.46 (4.38-5.43). - CMP normal except glucose 152, BUN 24 (5-23), creatinine 1.7 (0.6-1.4) - Hemoglobin A1c 6.2%  PLAN:  1. Right thyroid nodules - Pt had neck compression sxs and a barium swallow showed compression is from the right thyroid lobe >> he had R lobectomy 4 mo ago. TFTs 1 mo later were normal. - We discussed about the fact that there is a 24% risk of hypothyroidism after hemithyroidectomy >> will check labs today and discussed that we may need to start levothyroxine in the future >> explained how to take this correctly - I will see the patient back in a year, but will likely come to the lab sooner  2. Low TSH - resolved - check TFTs today  Office Visit on 06/13/2013  Component Date Value Ref Range Status  . TSH 06/13/2013 1.42  0.35 - 4.50 uIU/mL Final  . Free T4 06/13/2013 1.19  0.60 - 1.60 ng/dL Final  . T3, Free 06/13/2013 2.9  2.3 - 4.2 pg/mL Final   Excellent thyroid tests!  RTC in 6 months (12/2013) for labs and in 1 year for a visit.

## 2013-06-22 DIAGNOSIS — M069 Rheumatoid arthritis, unspecified: Secondary | ICD-10-CM | POA: Diagnosis not present

## 2013-06-30 DIAGNOSIS — M069 Rheumatoid arthritis, unspecified: Secondary | ICD-10-CM | POA: Diagnosis not present

## 2013-06-30 DIAGNOSIS — M25579 Pain in unspecified ankle and joints of unspecified foot: Secondary | ICD-10-CM | POA: Diagnosis not present

## 2013-07-11 DIAGNOSIS — D485 Neoplasm of uncertain behavior of skin: Secondary | ICD-10-CM | POA: Diagnosis not present

## 2013-07-11 DIAGNOSIS — L820 Inflamed seborrheic keratosis: Secondary | ICD-10-CM | POA: Diagnosis not present

## 2013-07-11 DIAGNOSIS — L570 Actinic keratosis: Secondary | ICD-10-CM | POA: Diagnosis not present

## 2013-07-11 DIAGNOSIS — L821 Other seborrheic keratosis: Secondary | ICD-10-CM | POA: Diagnosis not present

## 2013-07-20 DIAGNOSIS — N183 Chronic kidney disease, stage 3 unspecified: Secondary | ICD-10-CM | POA: Diagnosis not present

## 2013-07-20 DIAGNOSIS — I129 Hypertensive chronic kidney disease with stage 1 through stage 4 chronic kidney disease, or unspecified chronic kidney disease: Secondary | ICD-10-CM | POA: Diagnosis not present

## 2013-07-20 DIAGNOSIS — E1129 Type 2 diabetes mellitus with other diabetic kidney complication: Secondary | ICD-10-CM | POA: Diagnosis not present

## 2013-07-20 DIAGNOSIS — D638 Anemia in other chronic diseases classified elsewhere: Secondary | ICD-10-CM | POA: Diagnosis not present

## 2013-07-25 DIAGNOSIS — N401 Enlarged prostate with lower urinary tract symptoms: Secondary | ICD-10-CM | POA: Diagnosis not present

## 2013-07-25 DIAGNOSIS — N529 Male erectile dysfunction, unspecified: Secondary | ICD-10-CM | POA: Diagnosis not present

## 2013-07-25 DIAGNOSIS — N138 Other obstructive and reflux uropathy: Secondary | ICD-10-CM | POA: Diagnosis not present

## 2013-08-05 DIAGNOSIS — S61209A Unspecified open wound of unspecified finger without damage to nail, initial encounter: Secondary | ICD-10-CM | POA: Diagnosis not present

## 2013-08-05 DIAGNOSIS — Z1889 Other specified retained foreign body fragments: Secondary | ICD-10-CM | POA: Diagnosis not present

## 2013-08-05 DIAGNOSIS — S60459A Superficial foreign body of unspecified finger, initial encounter: Secondary | ICD-10-CM | POA: Diagnosis not present

## 2013-08-05 DIAGNOSIS — E7800 Pure hypercholesterolemia, unspecified: Secondary | ICD-10-CM | POA: Diagnosis not present

## 2013-08-05 DIAGNOSIS — I10 Essential (primary) hypertension: Secondary | ICD-10-CM | POA: Diagnosis not present

## 2013-08-05 DIAGNOSIS — W3400XA Accidental discharge from unspecified firearms or gun, initial encounter: Secondary | ICD-10-CM | POA: Diagnosis not present

## 2013-08-05 DIAGNOSIS — Z79899 Other long term (current) drug therapy: Secondary | ICD-10-CM | POA: Diagnosis not present

## 2013-08-24 DIAGNOSIS — S61409A Unspecified open wound of unspecified hand, initial encounter: Secondary | ICD-10-CM | POA: Diagnosis not present

## 2013-08-25 ENCOUNTER — Other Ambulatory Visit: Payer: Self-pay | Admitting: Orthopedic Surgery

## 2013-09-01 ENCOUNTER — Encounter (HOSPITAL_BASED_OUTPATIENT_CLINIC_OR_DEPARTMENT_OTHER): Payer: Self-pay | Admitting: *Deleted

## 2013-09-01 NOTE — Progress Notes (Signed)
09/01/13 1056  OBSTRUCTIVE SLEEP APNEA  Have you ever been diagnosed with sleep apnea through a sleep study? No  Do you snore loudly (loud enough to be heard through closed doors)?  0  Do you often feel tired, fatigued, or sleepy during the daytime? 0  Has anyone observed you stop breathing during your sleep? 0  Do you have, or are you being treated for high blood pressure? 1  BMI more than 35 kg/m2? 0  Age over 78 years old? 1  Neck circumference greater than 40 cm/16 inches? 1  Gender: 1  Obstructive Sleep Apnea Score 4  Score 4 or greater  Results sent to PCP

## 2013-09-01 NOTE — Progress Notes (Signed)
Will need istat- 

## 2013-09-06 ENCOUNTER — Ambulatory Visit (HOSPITAL_BASED_OUTPATIENT_CLINIC_OR_DEPARTMENT_OTHER)
Admission: RE | Admit: 2013-09-06 | Discharge: 2013-09-06 | Disposition: A | Discharge status: Home / Self Care | Payer: Medicare Other | Source: Ambulatory Visit | Attending: Orthopedic Surgery | Admitting: Orthopedic Surgery

## 2013-09-06 ENCOUNTER — Ambulatory Visit (HOSPITAL_BASED_OUTPATIENT_CLINIC_OR_DEPARTMENT_OTHER): Payer: Medicare Other | Admitting: Anesthesiology

## 2013-09-06 ENCOUNTER — Encounter (HOSPITAL_BASED_OUTPATIENT_CLINIC_OR_DEPARTMENT_OTHER): Payer: Medicare Other | Admitting: Anesthesiology

## 2013-09-06 ENCOUNTER — Encounter (HOSPITAL_BASED_OUTPATIENT_CLINIC_OR_DEPARTMENT_OTHER): Payer: Self-pay | Admitting: Orthopedic Surgery

## 2013-09-06 ENCOUNTER — Encounter (HOSPITAL_BASED_OUTPATIENT_CLINIC_OR_DEPARTMENT_OTHER)
Admission: RE | Disposition: A | Discharge status: Home / Self Care | Payer: Self-pay | Source: Ambulatory Visit | Attending: Orthopedic Surgery

## 2013-09-06 DIAGNOSIS — Y240XXA Airgun discharge, undetermined intent, initial encounter: Secondary | ICD-10-CM | POA: Insufficient documentation

## 2013-09-06 DIAGNOSIS — Y929 Unspecified place or not applicable: Secondary | ICD-10-CM | POA: Diagnosis not present

## 2013-09-06 DIAGNOSIS — Z87891 Personal history of nicotine dependence: Secondary | ICD-10-CM | POA: Insufficient documentation

## 2013-09-06 DIAGNOSIS — S60459A Superficial foreign body of unspecified finger, initial encounter: Secondary | ICD-10-CM | POA: Diagnosis not present

## 2013-09-06 DIAGNOSIS — D649 Anemia, unspecified: Secondary | ICD-10-CM | POA: Insufficient documentation

## 2013-09-06 DIAGNOSIS — G609 Hereditary and idiopathic neuropathy, unspecified: Secondary | ICD-10-CM | POA: Diagnosis not present

## 2013-09-06 DIAGNOSIS — K219 Gastro-esophageal reflux disease without esophagitis: Secondary | ICD-10-CM | POA: Diagnosis not present

## 2013-09-06 DIAGNOSIS — Z79899 Other long term (current) drug therapy: Secondary | ICD-10-CM | POA: Diagnosis not present

## 2013-09-06 DIAGNOSIS — K5900 Constipation, unspecified: Secondary | ICD-10-CM | POA: Diagnosis not present

## 2013-09-06 DIAGNOSIS — M069 Rheumatoid arthritis, unspecified: Secondary | ICD-10-CM | POA: Diagnosis not present

## 2013-09-06 DIAGNOSIS — I10 Essential (primary) hypertension: Secondary | ICD-10-CM | POA: Diagnosis not present

## 2013-09-06 DIAGNOSIS — E785 Hyperlipidemia, unspecified: Secondary | ICD-10-CM | POA: Insufficient documentation

## 2013-09-06 DIAGNOSIS — E119 Type 2 diabetes mellitus without complications: Secondary | ICD-10-CM | POA: Insufficient documentation

## 2013-09-06 DIAGNOSIS — S61409A Unspecified open wound of unspecified hand, initial encounter: Secondary | ICD-10-CM | POA: Diagnosis not present

## 2013-09-06 DIAGNOSIS — M795 Residual foreign body in soft tissue: Secondary | ICD-10-CM | POA: Diagnosis not present

## 2013-09-06 HISTORY — PX: FOREIGN BODY REMOVAL: SHX962

## 2013-09-06 LAB — POCT I-STAT, CHEM 8
BUN: 22 mg/dL (ref 6–23)
CALCIUM ION: 1.15 mmol/L (ref 1.13–1.30)
Chloride: 103 mEq/L (ref 96–112)
Creatinine, Ser: 1.7 mg/dL — ABNORMAL HIGH (ref 0.50–1.35)
Glucose, Bld: 160 mg/dL — ABNORMAL HIGH (ref 70–99)
HEMATOCRIT: 41 % (ref 39.0–52.0)
HEMOGLOBIN: 13.9 g/dL (ref 13.0–17.0)
Potassium: 3.3 mEq/L — ABNORMAL LOW (ref 3.7–5.3)
Sodium: 138 mEq/L (ref 137–147)
TCO2: 24 mmol/L (ref 0–100)

## 2013-09-06 LAB — GLUCOSE, CAPILLARY: Glucose-Capillary: 146 mg/dL — ABNORMAL HIGH (ref 70–99)

## 2013-09-06 SURGERY — REMOVAL FOREIGN BODY EXTREMITY
Anesthesia: Monitor Anesthesia Care | Site: Finger | Laterality: Right

## 2013-09-06 MED ORDER — ONDANSETRON HCL 4 MG/2ML IJ SOLN
INTRAMUSCULAR | Status: DC | PRN
  Administered 2013-09-06: 4 mg via INTRAVENOUS

## 2013-09-06 MED ORDER — MIDAZOLAM HCL 2 MG/2ML IJ SOLN: 1.0000 mg | INTRAMUSCULAR | Status: DC | PRN

## 2013-09-06 MED ORDER — PROPOFOL 10 MG/ML IV BOLUS
INTRAVENOUS | Status: AC
  Filled 2013-09-06: qty 20

## 2013-09-06 MED ORDER — FENTANYL CITRATE 0.05 MG/ML IJ SOLN
INTRAMUSCULAR | Status: AC
  Filled 2013-09-06: qty 4

## 2013-09-06 MED ORDER — FENTANYL CITRATE 0.05 MG/ML IJ SOLN
INTRAMUSCULAR | Status: DC | PRN
  Administered 2013-09-06 (×2): 50 ug via INTRAVENOUS

## 2013-09-06 MED ORDER — BUPIVACAINE HCL (PF) 0.25 % IJ SOLN
INTRAMUSCULAR | Status: DC | PRN
  Administered 2013-09-06: 6 mL

## 2013-09-06 MED ORDER — LACTATED RINGERS IV SOLN
INTRAVENOUS | Status: DC
  Administered 2013-09-06: 11:00:00 via INTRAVENOUS

## 2013-09-06 MED ORDER — CEFAZOLIN SODIUM-DEXTROSE 2-3 GM-% IV SOLR
INTRAVENOUS | Status: AC
  Filled 2013-09-06: qty 50

## 2013-09-06 MED ORDER — CHLORHEXIDINE GLUCONATE 4 % EX LIQD: 60.0000 mL | Freq: Once | CUTANEOUS | Status: DC

## 2013-09-06 MED ORDER — BUPIVACAINE HCL (PF) 0.25 % IJ SOLN
INTRAMUSCULAR | Status: AC
  Filled 2013-09-06: qty 30

## 2013-09-06 MED ORDER — PROPOFOL INFUSION 10 MG/ML OPTIME
INTRAVENOUS | Status: DC | PRN
  Administered 2013-09-06: 100 ug/kg/min via INTRAVENOUS

## 2013-09-06 MED ORDER — HYDROCODONE-ACETAMINOPHEN 5-325 MG PO TABS
1.0000 | ORAL_TABLET | Freq: Four times a day (QID) | ORAL | Status: DC | PRN
Start: 2013-09-06 — End: 2016-11-03

## 2013-09-06 MED ORDER — FENTANYL CITRATE 0.05 MG/ML IJ SOLN: 50.0000 ug | INTRAMUSCULAR | Status: DC | PRN

## 2013-09-06 MED ORDER — CEFAZOLIN SODIUM-DEXTROSE 2-3 GM-% IV SOLR
2.0000 g | INTRAVENOUS | Status: AC
  Administered 2013-09-06: 2 g via INTRAVENOUS

## 2013-09-06 MED ORDER — CEFAZOLIN SODIUM-DEXTROSE 2-3 GM-% IV SOLR: 2.0000 g | INTRAVENOUS | Status: DC

## 2013-09-06 SURGICAL SUPPLY — 47 items
BLADE MINI RND TIP GREEN BEAV (BLADE) IMPLANT
BLADE SURG 15 STRL LF DISP TIS (BLADE) ×1 IMPLANT
BLADE SURG 15 STRL SS (BLADE) ×2
BNDG CMPR 9X4 STRL LF SNTH (GAUZE/BANDAGES/DRESSINGS)
BNDG COHESIVE 1X5 TAN STRL LF (GAUZE/BANDAGES/DRESSINGS) ×1 IMPLANT
BNDG COHESIVE 3X5 TAN STRL LF (GAUZE/BANDAGES/DRESSINGS) ×2 IMPLANT
BNDG ESMARK 4X9 LF (GAUZE/BANDAGES/DRESSINGS) IMPLANT
BNDG GAUZE ELAST 4 BULKY (GAUZE/BANDAGES/DRESSINGS) ×1 IMPLANT
CHLORAPREP W/TINT 26ML (MISCELLANEOUS) ×2 IMPLANT
CORDS BIPOLAR (ELECTRODE) ×1 IMPLANT
COVER MAYO STAND STRL (DRAPES) ×2 IMPLANT
COVER TABLE BACK 60X90 (DRAPES) ×2 IMPLANT
CUFF TOURNIQUET SINGLE 18IN (TOURNIQUET CUFF) ×1 IMPLANT
DECANTER SPIKE VIAL GLASS SM (MISCELLANEOUS) IMPLANT
DRAPE EXTREMITY T 121X128X90 (DRAPE) ×2 IMPLANT
DRAPE OEC MINIVIEW 54X84 (DRAPES) ×2 IMPLANT
DRAPE SURG 17X23 STRL (DRAPES) ×2 IMPLANT
DRSG KUZMA FLUFF (GAUZE/BANDAGES/DRESSINGS) ×1 IMPLANT
GAUZE SPONGE 4X4 12PLY STRL (GAUZE/BANDAGES/DRESSINGS) ×2 IMPLANT
GAUZE XEROFORM 1X8 LF (GAUZE/BANDAGES/DRESSINGS) ×2 IMPLANT
GLOVE BIOGEL PI IND STRL 7.0 (GLOVE) IMPLANT
GLOVE BIOGEL PI IND STRL 8.5 (GLOVE) ×2 IMPLANT
GLOVE BIOGEL PI INDICATOR 7.0 (GLOVE) ×2
GLOVE BIOGEL PI INDICATOR 8.5 (GLOVE) ×1
GLOVE ECLIPSE 6.5 STRL STRAW (GLOVE) ×1 IMPLANT
GLOVE SURG ORTHO 8.0 STRL STRW (GLOVE) ×2 IMPLANT
GOWN STRL REUS W/ TWL LRG LVL3 (GOWN DISPOSABLE) IMPLANT
GOWN STRL REUS W/TWL LRG LVL3 (GOWN DISPOSABLE) ×2
GOWN STRL REUS W/TWL XL LVL3 (GOWN DISPOSABLE) ×3 IMPLANT
NEEDLE HYPO 22GX1.5 SAFETY (NEEDLE) IMPLANT
NS IRRIG 1000ML POUR BTL (IV SOLUTION) ×2 IMPLANT
PACK BASIN DAY SURGERY FS (CUSTOM PROCEDURE TRAY) ×2 IMPLANT
PAD CAST 3X4 CTTN HI CHSV (CAST SUPPLIES) ×1 IMPLANT
PADDING CAST ABS 3INX4YD NS (CAST SUPPLIES)
PADDING CAST ABS 4INX4YD NS (CAST SUPPLIES) ×1
PADDING CAST ABS COTTON 3X4 (CAST SUPPLIES) IMPLANT
PADDING CAST ABS COTTON 4X4 ST (CAST SUPPLIES) ×1 IMPLANT
PADDING CAST COTTON 3X4 STRL (CAST SUPPLIES) ×2
SPLINT PLASTER CAST XFAST 3X15 (CAST SUPPLIES) IMPLANT
SPLINT PLASTER XTRA FASTSET 3X (CAST SUPPLIES)
STOCKINETTE 4X48 STRL (DRAPES) ×2 IMPLANT
SUT ETHILON 5 0 P 3 18 (SUTURE)
SUT ETHILON 5 0 PS 2 18 (SUTURE) IMPLANT
SUT NYLON ETHILON 5-0 P-3 1X18 (SUTURE) IMPLANT
SYR CONTROL 10ML LL (SYRINGE) ×1 IMPLANT
TOWEL OR 17X24 6PK STRL BLUE (TOWEL DISPOSABLE) ×2 IMPLANT
UNDERPAD 30X30 INCONTINENT (UNDERPADS AND DIAPERS) ×2 IMPLANT

## 2013-09-06 NOTE — Anesthesia Postprocedure Evaluation (Signed)
  Anesthesia Post-op Note  Patient: [name]  Procedure(s) Performed: Procedure(s) with comments: REMOVAL FOREIGN BODY RIGHT INDEX FINGER (Right) - ANESTHESIA: IV REGIONAL FAB  Patient Location: PACU  Anesthesia Type: MAC  Level of Consciousness: awake and alert   Airway and Oxygen Therapy: Patient Spontanous Breathing  Post-op Pain: mild  Post-op Assessment: Post-op Vital signs reviewed, Patient's Cardiovascular Status Stable and Respiratory Function Stable  Post-op Vital Signs: Reviewed  Filed Vitals:   09/06/13 1230  BP: 137/64  Pulse: 80  Temp:   Resp: 17    Complications: No apparent anesthesia complications

## 2013-09-06 NOTE — Anesthesia Preprocedure Evaluation (Signed)
Anesthesia Evaluation  Patient identified by MRN, date of birth, ID band Patient awake    Reviewed: Allergy & Precautions, H&P , NPO status , Patient's Chart, lab work & pertinent test results  Airway Mallampati: II TM Distance: >3 FB Neck ROM: Full    Dental no notable dental hx. (+) Upper Dentures, Lower Dentures, Dental Advisory Given   Pulmonary neg pulmonary ROS, former smoker,  breath sounds clear to auscultation  Pulmonary exam normal       Cardiovascular hypertension, On Medications negative cardio ROS  Rhythm:Regular Rate:Normal     Neuro/Psych negative neurological ROS  negative psych ROS   GI/Hepatic Neg liver ROS, GERD-  Controlled and Medicated,  Endo/Other  diabetes, Type 2, Oral Hypoglycemic Agents  Renal/GU negative Renal ROS  negative genitourinary   Musculoskeletal   Abdominal   Peds  Hematology negative hematology ROS (+) anemia ,   Anesthesia Other Findings   Reproductive/Obstetrics negative OB ROS                           Anesthesia Physical Anesthesia Plan  ASA: III  Anesthesia Plan: MAC and Bier Block   Post-op Pain Management:    Induction: Intravenous  Airway Management Planned: Simple Face Mask  Additional Equipment:   Intra-op Plan:   Post-operative Plan:   Informed Consent: I have reviewed the patients History and Physical, chart, labs and discussed the procedure including the risks, benefits and alternatives for the proposed anesthesia with the patient or authorized representative who has indicated his/her understanding and acceptance.   Dental advisory given  Plan Discussed with: CRNA  Anesthesia Plan Comments:         Anesthesia Quick Evaluation
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
## 2013-09-06 NOTE — Op Note (Signed)
Dictation Number [id number]

## 2013-09-06 NOTE — Discharge Instructions (Addendum)

## 2013-09-06 NOTE — Transfer of Care (Signed)
Immediate Anesthesia Transfer of Care Note  Patient: [name]  Procedure(s) Performed: Procedure(s) with comments: REMOVAL FOREIGN BODY RIGHT INDEX FINGER (Right) - ANESTHESIA: IV REGIONAL FAB  Patient Location: PACU  Anesthesia Type:Bier block  Level of Consciousness: awake, alert , oriented and patient cooperative  Airway & Oxygen Therapy: Patient Spontanous Breathing and Patient connected to face mask oxygen  Post-op Assessment: Report given to PACU RN and Post -op Vital signs reviewed and stable  Post vital signs: Reviewed and stable  Complications: No apparent anesthesia complications

## 2013-09-06 NOTE — Brief Op Note (Signed)
09/06/2013  11:42 AM  PATIENT:  [name]  80 y.o. male  PRE-OPERATIVE DIAGNOSIS:  F B RIGHT INDEX PROXIMAL PHALANX  POST-OPERATIVE DIAGNOSIS:  foreign body right index finger  PROCEDURE:  Procedure(s) with comments: REMOVAL FOREIGN BODY RIGHT INDEX FINGER (Right) - ANESTHESIA: IV REGIONAL FAB  SURGEON:  Surgeon(s) and Role:    * Daryll Brod, MD - Primary    * Leanora Cover, MD - Assisting  PHYSICIAN ASSISTANT:   ASSISTANTS: K Janis Cuffe,MD   ANESTHESIA:   local and regional  EBL:     BLOOD ADMINISTERED:none  DRAINS: none   LOCAL MEDICATIONS USED:  BUPIVICAINE   SPECIMEN:  No Specimen  DISPOSITION OF SPECIMEN:  N/A  COUNTS:  YES  TOURNIQUET:   Total Tourniquet Time Documented: Forearm (Right) - 21 minutes Total: Forearm (Right) - 21 minutes   DICTATION: .Other Dictation: Dictation Number [id number]  PLAN OF CARE: Discharge to home after PACU  PATIENT DISPOSITION:  PACU - hemodynamically stable.

## 2013-09-07 ENCOUNTER — Encounter (HOSPITAL_BASED_OUTPATIENT_CLINIC_OR_DEPARTMENT_OTHER): Payer: Self-pay | Admitting: Orthopedic Surgery
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
## 2013-09-23 DIAGNOSIS — M069 Rheumatoid arthritis, unspecified: Secondary | ICD-10-CM | POA: Diagnosis not present

## 2013-09-28 DIAGNOSIS — M25579 Pain in unspecified ankle and joints of unspecified foot: Secondary | ICD-10-CM | POA: Diagnosis not present

## 2013-09-28 DIAGNOSIS — M069 Rheumatoid arthritis, unspecified: Secondary | ICD-10-CM | POA: Diagnosis not present

## 2013-10-10 DIAGNOSIS — M069 Rheumatoid arthritis, unspecified: Secondary | ICD-10-CM | POA: Diagnosis not present

## 2013-10-18 DIAGNOSIS — M25571 Pain in right ankle and joints of right foot: Secondary | ICD-10-CM | POA: Diagnosis not present

## 2013-10-18 DIAGNOSIS — M0589 Other rheumatoid arthritis with rheumatoid factor of multiple sites: Secondary | ICD-10-CM | POA: Diagnosis not present

## 2013-10-24 DIAGNOSIS — M0579 Rheumatoid arthritis with rheumatoid factor of multiple sites without organ or systems involvement: Secondary | ICD-10-CM | POA: Diagnosis not present

## 2013-10-26 DIAGNOSIS — R339 Retention of urine, unspecified: Secondary | ICD-10-CM | POA: Diagnosis not present

## 2013-10-26 DIAGNOSIS — N401 Enlarged prostate with lower urinary tract symptoms: Secondary | ICD-10-CM | POA: Diagnosis not present

## 2013-11-16 DIAGNOSIS — Z125 Encounter for screening for malignant neoplasm of prostate: Secondary | ICD-10-CM | POA: Diagnosis not present

## 2013-11-16 DIAGNOSIS — N401 Enlarged prostate with lower urinary tract symptoms: Secondary | ICD-10-CM | POA: Diagnosis not present

## 2013-11-16 DIAGNOSIS — R339 Retention of urine, unspecified: Secondary | ICD-10-CM | POA: Diagnosis not present

## 2013-11-17 DIAGNOSIS — J189 Pneumonia, unspecified organism: Secondary | ICD-10-CM | POA: Diagnosis not present

## 2013-11-17 DIAGNOSIS — J209 Acute bronchitis, unspecified: Secondary | ICD-10-CM | POA: Diagnosis not present

## 2013-11-17 DIAGNOSIS — E119 Type 2 diabetes mellitus without complications: Secondary | ICD-10-CM | POA: Diagnosis not present

## 2013-11-21 DIAGNOSIS — M0589 Other rheumatoid arthritis with rheumatoid factor of multiple sites: Secondary | ICD-10-CM | POA: Diagnosis not present

## 2013-11-23 DIAGNOSIS — C44329 Squamous cell carcinoma of skin of other parts of face: Secondary | ICD-10-CM | POA: Diagnosis not present

## 2013-11-23 DIAGNOSIS — L570 Actinic keratosis: Secondary | ICD-10-CM | POA: Diagnosis not present

## 2013-11-25 DIAGNOSIS — E780 Pure hypercholesterolemia: Secondary | ICD-10-CM | POA: Diagnosis not present

## 2013-11-25 DIAGNOSIS — M150 Primary generalized (osteo)arthritis: Secondary | ICD-10-CM | POA: Diagnosis not present

## 2013-11-25 DIAGNOSIS — I5031 Acute diastolic (congestive) heart failure: Secondary | ICD-10-CM | POA: Diagnosis not present

## 2013-11-25 DIAGNOSIS — J440 Chronic obstructive pulmonary disease with acute lower respiratory infection: Secondary | ICD-10-CM | POA: Diagnosis not present

## 2013-11-25 DIAGNOSIS — J811 Chronic pulmonary edema: Secondary | ICD-10-CM | POA: Diagnosis not present

## 2013-11-25 DIAGNOSIS — I480 Paroxysmal atrial fibrillation: Secondary | ICD-10-CM | POA: Diagnosis not present

## 2013-11-25 DIAGNOSIS — K219 Gastro-esophageal reflux disease without esophagitis: Secondary | ICD-10-CM | POA: Diagnosis present

## 2013-11-25 DIAGNOSIS — I10 Essential (primary) hypertension: Secondary | ICD-10-CM | POA: Diagnosis present

## 2013-11-25 DIAGNOSIS — M1990 Unspecified osteoarthritis, unspecified site: Secondary | ICD-10-CM | POA: Diagnosis not present

## 2013-11-25 DIAGNOSIS — Z79899 Other long term (current) drug therapy: Secondary | ICD-10-CM | POA: Diagnosis not present

## 2013-11-25 DIAGNOSIS — R0602 Shortness of breath: Secondary | ICD-10-CM | POA: Diagnosis not present

## 2013-11-25 DIAGNOSIS — R262 Difficulty in walking, not elsewhere classified: Secondary | ICD-10-CM | POA: Diagnosis not present

## 2013-11-25 DIAGNOSIS — N179 Acute kidney failure, unspecified: Secondary | ICD-10-CM | POA: Diagnosis not present

## 2013-11-25 DIAGNOSIS — T380X5A Adverse effect of glucocorticoids and synthetic analogues, initial encounter: Secondary | ICD-10-CM | POA: Diagnosis not present

## 2013-11-25 DIAGNOSIS — E119 Type 2 diabetes mellitus without complications: Secondary | ICD-10-CM | POA: Diagnosis present

## 2013-11-25 DIAGNOSIS — I361 Nonrheumatic tricuspid (valve) insufficiency: Secondary | ICD-10-CM | POA: Diagnosis not present

## 2013-11-25 DIAGNOSIS — M069 Rheumatoid arthritis, unspecified: Secondary | ICD-10-CM | POA: Diagnosis not present

## 2013-11-25 DIAGNOSIS — I509 Heart failure, unspecified: Secondary | ICD-10-CM | POA: Diagnosis not present

## 2013-11-25 DIAGNOSIS — N281 Cyst of kidney, acquired: Secondary | ICD-10-CM | POA: Diagnosis not present

## 2013-11-25 DIAGNOSIS — I5030 Unspecified diastolic (congestive) heart failure: Secondary | ICD-10-CM | POA: Diagnosis not present

## 2013-11-25 DIAGNOSIS — R0789 Other chest pain: Secondary | ICD-10-CM | POA: Diagnosis not present

## 2013-11-25 DIAGNOSIS — R072 Precordial pain: Secondary | ICD-10-CM | POA: Diagnosis not present

## 2013-11-25 DIAGNOSIS — M05741 Rheumatoid arthritis with rheumatoid factor of right hand without organ or systems involvement: Secondary | ICD-10-CM | POA: Diagnosis not present

## 2013-12-02 DIAGNOSIS — M069 Rheumatoid arthritis, unspecified: Secondary | ICD-10-CM | POA: Diagnosis not present

## 2013-12-02 DIAGNOSIS — J440 Chronic obstructive pulmonary disease with acute lower respiratory infection: Secondary | ICD-10-CM | POA: Diagnosis not present

## 2013-12-02 DIAGNOSIS — E119 Type 2 diabetes mellitus without complications: Secondary | ICD-10-CM | POA: Diagnosis not present

## 2013-12-02 DIAGNOSIS — I5030 Unspecified diastolic (congestive) heart failure: Secondary | ICD-10-CM | POA: Diagnosis not present

## 2013-12-07 ENCOUNTER — Inpatient Hospital Stay (HOSPITAL_COMMUNITY)
Admission: EM | Admit: 2013-12-07 | Discharge: 2013-12-13 | DRG: 291 | Disposition: A | Discharge status: Home / Self Care | Payer: Medicare Other | Attending: Oncology | Admitting: Oncology

## 2013-12-07 ENCOUNTER — Encounter (HOSPITAL_COMMUNITY): Payer: Self-pay | Admitting: *Deleted

## 2013-12-07 ENCOUNTER — Inpatient Hospital Stay (HOSPITAL_COMMUNITY): Payer: Medicare Other

## 2013-12-07 ENCOUNTER — Emergency Department (HOSPITAL_COMMUNITY): Payer: Medicare Other

## 2013-12-07 DIAGNOSIS — E860 Dehydration: Secondary | ICD-10-CM | POA: Diagnosis present

## 2013-12-07 DIAGNOSIS — Z9842 Cataract extraction status, left eye: Secondary | ICD-10-CM | POA: Diagnosis not present

## 2013-12-07 DIAGNOSIS — R918 Other nonspecific abnormal finding of lung field: Secondary | ICD-10-CM | POA: Diagnosis not present

## 2013-12-07 DIAGNOSIS — I480 Paroxysmal atrial fibrillation: Secondary | ICD-10-CM | POA: Diagnosis present

## 2013-12-07 DIAGNOSIS — Z87891 Personal history of nicotine dependence: Secondary | ICD-10-CM

## 2013-12-07 DIAGNOSIS — J440 Chronic obstructive pulmonary disease with acute lower respiratory infection: Secondary | ICD-10-CM | POA: Diagnosis not present

## 2013-12-07 DIAGNOSIS — N401 Enlarged prostate with lower urinary tract symptoms: Secondary | ICD-10-CM | POA: Diagnosis present

## 2013-12-07 DIAGNOSIS — R05 Cough: Secondary | ICD-10-CM

## 2013-12-07 DIAGNOSIS — G629 Polyneuropathy, unspecified: Secondary | ICD-10-CM | POA: Diagnosis present

## 2013-12-07 DIAGNOSIS — Z96651 Presence of right artificial knee joint: Secondary | ICD-10-CM | POA: Diagnosis present

## 2013-12-07 DIAGNOSIS — Z96611 Presence of right artificial shoulder joint: Secondary | ICD-10-CM | POA: Diagnosis present

## 2013-12-07 DIAGNOSIS — E119 Type 2 diabetes mellitus without complications: Secondary | ICD-10-CM | POA: Diagnosis not present

## 2013-12-07 DIAGNOSIS — I2510 Atherosclerotic heart disease of native coronary artery without angina pectoris: Secondary | ICD-10-CM | POA: Diagnosis present

## 2013-12-07 DIAGNOSIS — I10 Essential (primary) hypertension: Secondary | ICD-10-CM | POA: Diagnosis not present

## 2013-12-07 DIAGNOSIS — I059 Rheumatic mitral valve disease, unspecified: Secondary | ICD-10-CM | POA: Diagnosis not present

## 2013-12-07 DIAGNOSIS — Z96612 Presence of left artificial shoulder joint: Secondary | ICD-10-CM | POA: Diagnosis present

## 2013-12-07 DIAGNOSIS — E1122 Type 2 diabetes mellitus with diabetic chronic kidney disease: Secondary | ICD-10-CM | POA: Diagnosis present

## 2013-12-07 DIAGNOSIS — J40 Bronchitis, not specified as acute or chronic: Secondary | ICD-10-CM | POA: Diagnosis not present

## 2013-12-07 DIAGNOSIS — I509 Heart failure, unspecified: Secondary | ICD-10-CM

## 2013-12-07 DIAGNOSIS — J4489 Other specified chronic obstructive pulmonary disease: Secondary | ICD-10-CM | POA: Diagnosis present

## 2013-12-07 DIAGNOSIS — Z961 Presence of intraocular lens: Secondary | ICD-10-CM | POA: Diagnosis present

## 2013-12-07 DIAGNOSIS — R0602 Shortness of breath: Secondary | ICD-10-CM

## 2013-12-07 DIAGNOSIS — M069 Rheumatoid arthritis, unspecified: Secondary | ICD-10-CM | POA: Diagnosis present

## 2013-12-07 DIAGNOSIS — I119 Hypertensive heart disease without heart failure: Secondary | ICD-10-CM | POA: Clinically undetermined

## 2013-12-07 DIAGNOSIS — J449 Chronic obstructive pulmonary disease, unspecified: Secondary | ICD-10-CM | POA: Diagnosis present

## 2013-12-07 DIAGNOSIS — I4891 Unspecified atrial fibrillation: Secondary | ICD-10-CM | POA: Diagnosis present

## 2013-12-07 DIAGNOSIS — R3911 Hesitancy of micturition: Secondary | ICD-10-CM | POA: Diagnosis present

## 2013-12-07 DIAGNOSIS — Z9841 Cataract extraction status, right eye: Secondary | ICD-10-CM

## 2013-12-07 DIAGNOSIS — H9190 Unspecified hearing loss, unspecified ear: Secondary | ICD-10-CM | POA: Diagnosis present

## 2013-12-07 DIAGNOSIS — E079 Disorder of thyroid, unspecified: Secondary | ICD-10-CM | POA: Diagnosis present

## 2013-12-07 DIAGNOSIS — Z7901 Long term (current) use of anticoagulants: Secondary | ICD-10-CM | POA: Diagnosis not present

## 2013-12-07 DIAGNOSIS — N179 Acute kidney failure, unspecified: Secondary | ICD-10-CM | POA: Diagnosis present

## 2013-12-07 DIAGNOSIS — E785 Hyperlipidemia, unspecified: Secondary | ICD-10-CM | POA: Diagnosis present

## 2013-12-07 DIAGNOSIS — Z794 Long term (current) use of insulin: Secondary | ICD-10-CM | POA: Diagnosis not present

## 2013-12-07 DIAGNOSIS — R0600 Dyspnea, unspecified: Secondary | ICD-10-CM | POA: Diagnosis not present

## 2013-12-07 DIAGNOSIS — I481 Persistent atrial fibrillation: Secondary | ICD-10-CM | POA: Diagnosis not present

## 2013-12-07 DIAGNOSIS — I130 Hypertensive heart and chronic kidney disease with heart failure and stage 1 through stage 4 chronic kidney disease, or unspecified chronic kidney disease: Secondary | ICD-10-CM | POA: Diagnosis present

## 2013-12-07 DIAGNOSIS — K219 Gastro-esophageal reflux disease without esophagitis: Secondary | ICD-10-CM | POA: Diagnosis present

## 2013-12-07 DIAGNOSIS — N184 Chronic kidney disease, stage 4 (severe): Secondary | ICD-10-CM | POA: Diagnosis present

## 2013-12-07 DIAGNOSIS — R000 Tachycardia, unspecified: Secondary | ICD-10-CM | POA: Diagnosis not present

## 2013-12-07 DIAGNOSIS — J471 Bronchiectasis with (acute) exacerbation: Secondary | ICD-10-CM | POA: Diagnosis present

## 2013-12-07 DIAGNOSIS — I5041 Acute combined systolic (congestive) and diastolic (congestive) heart failure: Secondary | ICD-10-CM | POA: Diagnosis present

## 2013-12-07 DIAGNOSIS — I5033 Acute on chronic diastolic (congestive) heart failure: Secondary | ICD-10-CM | POA: Diagnosis not present

## 2013-12-07 DIAGNOSIS — I5030 Unspecified diastolic (congestive) heart failure: Secondary | ICD-10-CM | POA: Diagnosis not present

## 2013-12-07 DIAGNOSIS — E039 Hypothyroidism, unspecified: Secondary | ICD-10-CM | POA: Diagnosis present

## 2013-12-07 DIAGNOSIS — R053 Chronic cough: Secondary | ICD-10-CM

## 2013-12-07 DIAGNOSIS — I517 Cardiomegaly: Secondary | ICD-10-CM | POA: Diagnosis not present

## 2013-12-07 HISTORY — DX: Unspecified atrial fibrillation: I48.91

## 2013-12-07 LAB — TROPONIN I

## 2013-12-07 LAB — COMPREHENSIVE METABOLIC PANEL
ALT: 19 U/L (ref 0–53)
AST: 14 U/L (ref 0–37)
Albumin: 3.3 g/dL — ABNORMAL LOW (ref 3.5–5.2)
Alkaline Phosphatase: 63 U/L (ref 39–117)
Anion gap: 20 — ABNORMAL HIGH (ref 5–15)
BUN: 55 mg/dL — ABNORMAL HIGH (ref 6–23)
CALCIUM: 8.7 mg/dL (ref 8.4–10.5)
CO2: 22 meq/L (ref 19–32)
CREATININE: 2.09 mg/dL — AB (ref 0.50–1.35)
Chloride: 85 mEq/L — ABNORMAL LOW (ref 96–112)
GFR, EST AFRICAN AMERICAN: 33 mL/min — AB (ref >90)
GFR, EST NON AFRICAN AMERICAN: 28 mL/min — AB (ref >90)
GLUCOSE: 356 mg/dL — AB (ref 70–99)
Potassium: 3.3 mEq/L — ABNORMAL LOW (ref 3.7–5.3)
Sodium: 127 mEq/L — ABNORMAL LOW (ref 137–147)
Total Bilirubin: 0.6 mg/dL (ref 0.3–1.2)
Total Protein: 6.2 g/dL (ref 6.0–8.3)

## 2013-12-07 LAB — CBC WITH DIFFERENTIAL/PLATELET
BASOS ABS: 0 10*3/uL (ref 0.0–0.1)
Basophils Relative: 0 % (ref 0–1)
EOS PCT: 1 % (ref 0–5)
Eosinophils Absolute: 0.1 10*3/uL (ref 0.0–0.7)
HCT: 37.2 % — ABNORMAL LOW (ref 39.0–52.0)
Hemoglobin: 13.4 g/dL (ref 13.0–17.0)
LYMPHS ABS: 1.2 10*3/uL (ref 0.7–4.0)
Lymphocytes Relative: 6 % — ABNORMAL LOW (ref 12–46)
MCH: 30.3 pg (ref 26.0–34.0)
MCHC: 36 g/dL (ref 30.0–36.0)
MCV: 84.2 fL (ref 78.0–100.0)
MONO ABS: 0.8 10*3/uL (ref 0.1–1.0)
Monocytes Relative: 4 % (ref 3–12)
Neutro Abs: 16.3 10*3/uL — ABNORMAL HIGH (ref 1.7–7.7)
Neutrophils Relative %: 89 % — ABNORMAL HIGH (ref 43–77)
PLATELETS: 152 10*3/uL (ref 150–400)
RBC: 4.42 MIL/uL (ref 4.22–5.81)
RDW: 12.4 % (ref 11.5–15.5)
WBC: 18.4 10*3/uL — ABNORMAL HIGH (ref 4.0–10.5)

## 2013-12-07 LAB — URINALYSIS, ROUTINE W REFLEX MICROSCOPIC
BILIRUBIN URINE: NEGATIVE
Glucose, UA: 250 mg/dL — AB
Hgb urine dipstick: NEGATIVE
KETONES UR: NEGATIVE mg/dL
LEUKOCYTES UA: NEGATIVE
Nitrite: NEGATIVE
PH: 6 (ref 5.0–8.0)
PROTEIN: NEGATIVE mg/dL
Specific Gravity, Urine: 1.009 (ref 1.005–1.030)
UROBILINOGEN UA: 0.2 mg/dL (ref 0.0–1.0)

## 2013-12-07 LAB — PROCALCITONIN: Procalcitonin: <0.1 ng/mL

## 2013-12-07 LAB — PRO B NATRIURETIC PEPTIDE: PRO B NATRI PEPTIDE: 4485 pg/mL — AB (ref 0–450)

## 2013-12-07 MED ORDER — POTASSIUM CHLORIDE IN NACL 20-0.9 MEQ/L-% IV SOLN
INTRAVENOUS | Status: DC
  Administered 2013-12-07: 23:00:00 via INTRAVENOUS
  Filled 2013-12-07 (×3): qty 1000

## 2013-12-07 MED ORDER — IPRATROPIUM-ALBUTEROL 0.5-2.5 (3) MG/3ML IN SOLN: 3.0000 mL | Freq: Four times a day (QID) | RESPIRATORY_TRACT | Status: DC | PRN

## 2013-12-07 MED ORDER — FAMOTIDINE 20 MG PO TABS
20.0000 mg | ORAL_TABLET | Freq: Two times a day (BID) | ORAL | Status: DC
Start: 2013-12-07 — End: 2013-12-09
  Administered 2013-12-07 – 2013-12-08 (×3): 20 mg via ORAL
  Filled 2013-12-07 (×5): qty 1

## 2013-12-07 MED ORDER — METFORMIN HCL 500 MG PO TABS: 1000.0000 mg | ORAL_TABLET | Freq: Two times a day (BID) | ORAL | Status: DC

## 2013-12-07 MED ORDER — PIPERACILLIN-TAZOBACTAM 3.375 G IVPB
3.3750 g | Freq: Three times a day (TID) | INTRAVENOUS | Status: DC
  Administered 2013-12-08: 3.375 g via INTRAVENOUS
  Filled 2013-12-07 (×2): qty 50

## 2013-12-07 MED ORDER — VANCOMYCIN HCL 10 G IV SOLR
1250.0000 mg | INTRAVENOUS | Status: DC
  Filled 2013-12-07: qty 1250

## 2013-12-07 MED ORDER — FINASTERIDE 5 MG PO TABS
5.0000 mg | ORAL_TABLET | Freq: Every day | ORAL | Status: DC
  Administered 2013-12-07 – 2013-12-13 (×7): 5 mg via ORAL
  Filled 2013-12-07 (×7): qty 1

## 2013-12-07 MED ORDER — TAMSULOSIN HCL 0.4 MG PO CAPS
0.4000 mg | ORAL_CAPSULE | Freq: Every day | ORAL | Status: DC
  Administered 2013-12-07 – 2013-12-13 (×7): 0.4 mg via ORAL
  Filled 2013-12-07 (×7): qty 1

## 2013-12-07 MED ORDER — MONTELUKAST SODIUM 10 MG PO TABS
10.0000 mg | ORAL_TABLET | Freq: Every day | ORAL | Status: DC
  Administered 2013-12-07 – 2013-12-12 (×6): 10 mg via ORAL
  Filled 2013-12-07 (×7): qty 1

## 2013-12-07 MED ORDER — TETANUS-DIPHTH-ACELL PERTUSSIS 5-2.5-18.5 LF-MCG/0.5 IM SUSP: 0.5000 mL | Freq: Once | INTRAMUSCULAR | Status: DC

## 2013-12-07 MED ORDER — METHOTREXATE (PF) 25 MG/0.4ML ~~LOC~~ SOAJ: 25.0000 mg | SUBCUTANEOUS | Status: DC

## 2013-12-07 MED ORDER — DILTIAZEM HCL ER COATED BEADS 360 MG PO CP24
360.0000 mg | ORAL_CAPSULE | Freq: Every day | ORAL | Status: DC
  Filled 2013-12-07: qty 1

## 2013-12-07 MED ORDER — LISINOPRIL 20 MG PO TABS: 20.0000 mg | ORAL_TABLET | Freq: Every day | ORAL | Status: DC

## 2013-12-07 MED ORDER — SENNA 8.6 MG PO TABS
1.0000 | ORAL_TABLET | Freq: Two times a day (BID) | ORAL | Status: DC
  Administered 2013-12-07 – 2013-12-13 (×11): 8.6 mg via ORAL
  Filled 2013-12-07 (×13): qty 1

## 2013-12-07 MED ORDER — TEMAZEPAM 15 MG PO CAPS
15.0000 mg | ORAL_CAPSULE | Freq: Every evening | ORAL | Status: DC | PRN
  Administered 2013-12-07 – 2013-12-12 (×6): 15 mg via ORAL
  Filled 2013-12-07 (×6): qty 1

## 2013-12-07 MED ORDER — DILTIAZEM HCL 30 MG PO TABS
30.0000 mg | ORAL_TABLET | Freq: Four times a day (QID) | ORAL | Status: DC
  Administered 2013-12-07 – 2013-12-09 (×6): 30 mg via ORAL
  Filled 2013-12-07 (×10): qty 1

## 2013-12-07 MED ORDER — HEPARIN SODIUM (PORCINE) 5000 UNIT/ML IJ SOLN
5000.0000 [IU] | Freq: Three times a day (TID) | INTRAMUSCULAR | Status: DC
  Administered 2013-12-07 – 2013-12-10 (×8): 5000 [IU] via SUBCUTANEOUS
  Filled 2013-12-07 (×9): qty 1

## 2013-12-07 MED ORDER — INSULIN ASPART 100 UNIT/ML ~~LOC~~ SOLN
0.0000 [IU] | Freq: Three times a day (TID) | SUBCUTANEOUS | Status: DC
  Administered 2013-12-08: 7 [IU] via SUBCUTANEOUS

## 2013-12-07 MED ORDER — PIPERACILLIN-TAZOBACTAM 3.375 G IVPB 30 MIN
3.3750 g | Freq: Once | INTRAVENOUS | Status: AC
  Administered 2013-12-07: 3.375 g via INTRAVENOUS
  Filled 2013-12-07: qty 50

## 2013-12-07 MED ORDER — HYDROCODONE-ACETAMINOPHEN 5-325 MG PO TABS
1.0000 | ORAL_TABLET | Freq: Four times a day (QID) | ORAL | Status: DC | PRN
Start: 2013-12-07 — End: 2013-12-13

## 2013-12-07 MED ORDER — GABAPENTIN 300 MG PO CAPS
300.0000 mg | ORAL_CAPSULE | Freq: Three times a day (TID) | ORAL | Status: DC
  Administered 2013-12-07 – 2013-12-10 (×8): 300 mg via ORAL
  Filled 2013-12-07 (×11): qty 1

## 2013-12-07 MED ORDER — PRAVASTATIN SODIUM 40 MG PO TABS
40.0000 mg | ORAL_TABLET | Freq: Every day | ORAL | Status: DC
  Administered 2013-12-07 – 2013-12-13 (×7): 40 mg via ORAL
  Filled 2013-12-07 (×7): qty 1

## 2013-12-07 MED ORDER — ASPIRIN EC 81 MG PO TBEC
81.0000 mg | DELAYED_RELEASE_TABLET | Freq: Every day | ORAL | Status: DC
  Administered 2013-12-07 – 2013-12-10 (×4): 81 mg via ORAL
  Filled 2013-12-07 (×4): qty 1

## 2013-12-07 MED ORDER — VANCOMYCIN HCL 10 G IV SOLR
1750.0000 mg | Freq: Once | INTRAVENOUS | Status: AC
Start: 2013-12-07 — End: 2013-12-08
  Administered 2013-12-07: 1750 mg via INTRAVENOUS
  Filled 2013-12-07: qty 1750

## 2013-12-07 NOTE — ED Provider Notes (Signed)
CSN: [id number]     Arrival date & time 12/07/13  1328 History   First MD Initiated Contact with Patient 12/07/13 1345     Chief Complaint  Patient presents with  . Shortness of Breath     (Consider location/radiation/quality/duration/timing/severity/associated sxs/prior Treatment) Patient is a 80 y.o. male presenting with shortness of breath.  Shortness of Breath Severity:  Moderate Onset quality:  Gradual Timing:  Constant Progression:  Unchanged Chronicity:  Recurrent Context comment:  Recently admitted to [facility] for PNA Relieved by:  Nothing Worsened by:  Nothing tried Ineffective treatments:  None tried Associated symptoms: chest pain (pressure) and cough (chronically)   Associated symptoms: no abdominal pain, no fever and no sputum production     Past Medical History  Diagnosis Date  . Anemia   . Hyperlipidemia     takes Pravastatin daily  . Hypertension     takes Cardizem,Proscar,and Lisinopril daily  . Peripheral neuropathy     takes Furosemide daily  . Joint pain   . Joint swelling   . GERD (gastroesophageal reflux disease)     takes Zantac daily   . Constipation     takes stool softener daily  . History of blood transfusion     "related to one of my ORs"  . Impaired hearing     wears hearing aids  . Type II diabetes mellitus     takes Metformin daily  . Rheumatoid arthritis     "all over; take injections q 6 wks" (02/01/2013)   Past Surgical History  Procedure Laterality Date  . Total shoulder replacement Left 03/2010  . Shoulder arthroscopy w/ rotator cuff repair Right 1990's  . Repair tendons foot Left 1946; 1984    "farming accident; repaired tendons both times"  . Cataract extraction w/ intraocular lens  implant, bilateral Bilateral 1990's  . Total knee arthroplasty Right 12/16/2010    Procedure: TOTAL KNEE ARTHROPLASTY; rt Surgeon: Rudean Haskell, MD;  Location: [facility];  Service: Orthopedics;  Laterality: Right;  . Ankle fusion Left 2014  .  Tonsillectomy  1930's  . Colonoscopy    . Thyroidectomy, partial Right 02/01/2013  . Carpal tunnel release Bilateral 1990's  . Biopsy thyroid  2014  . Thyroidectomy Right 02/01/2013    Procedure: RIGHT THYROIDECTOMY LOBECTOMY;  Surgeon: Earnstine Regal, MD;  Location: [facility];  Service: General;  Laterality: Right;  . Foreign body removal Right 09/06/2013    Procedure: REMOVAL FOREIGN BODY RIGHT INDEX FINGER;  Surgeon: Daryll Brod, MD;  Location: [facility];  Service: Orthopedics;  Laterality: Right;  ANESTHESIA: IV REGIONAL FAB   Family History  Problem Relation Age of Onset  . Cancer Mother     breast   History  Substance Use Topics  . Smoking status: Former Smoker -- 1.00 packs/day for 15 years    Types: Cigarettes    Quit date: 09/01/1965  . Smokeless tobacco: Never Used     Comment: quit smoking in 1967  . 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  Respiratory: Positive for cough (chronically) and shortness of breath. Negative for sputum production.   Cardiovascular: Positive for chest pain (pressure).  Gastrointestinal: Negative for abdominal pain.  All other systems reviewed and are negative.     Allergies  Review of patient's allergies indicates no known allergies.  Home Medications   Prior to  Admission medications   Medication Sig Start Date End Date Taking? Authorizing Provider  diltiazem (CARDIZEM CD) 360 MG 24 hr capsule Take 360 mg by mouth daily.    Yes Historical Provider, MD  Docusate Sodium (DSS) 100 MG CAPS Take 100 mg by mouth 2 (two) times daily. 10/03/11  Yes Wylene Simmer, MD  finasteride (PROSCAR) 5 MG tablet Take 5 mg by mouth daily.   Yes Historical Provider, MD  folic acid (FOLVITE) 1 MG tablet Take 1 mg by mouth daily.     Yes Historical Provider, MD  furosemide (LASIX) 40 MG tablet Take 40 mg by mouth daily.   Yes Historical  Provider, MD  HYDROcodone-acetaminophen (NORCO) 5-325 MG per tablet Take 1 tablet by mouth every 6 (six) hours as needed for moderate pain. 09/06/13  Yes Daryll Brod, MD  InFLIXimab (REMICADE IV) Inject into the vein every 6 (six) weeks. Dr Ouida Sills.   Yes Historical Provider, MD  ipratropium-albuterol (DUONEB) 0.5-2.5 (3) MG/3ML SOLN Take 3 mLs by nebulization every 6 (six) hours as needed (shortness of breath).   Yes Historical Provider, MD  lisinopril (PRINIVIL,ZESTRIL) 20 MG tablet Take 20 mg by mouth daily.     Yes Historical Provider, MD  metFORMIN (GLUCOPHAGE) 1000 MG tablet Take 1,000 mg by mouth 2 (two) times daily with a meal.     Yes Historical Provider, MD  Methotrexate, PF, 25 MG/0.4ML SOAJ Inject 25 mg into the skin once a week. On Sunday   Yes Historical Provider, MD  montelukast (SINGULAIR) 10 MG tablet Take 10 mg by mouth at bedtime.   Yes Historical Provider, MD  pravastatin (PRAVACHOL) 40 MG tablet Take 40 mg by mouth daily.   Yes Historical Provider, MD  ranitidine (ZANTAC) 150 MG tablet Take 150 mg by mouth 2 (two) times daily.     Yes Historical Provider, MD  senna (SENOKOT) 8.6 MG TABS Take 1 tablet by mouth 2 (two) times daily. 10/03/11  Yes Wylene Simmer, MD  Tamsulosin HCl (FLOMAX) 0.4 MG CAPS Take 0.4 mg by mouth daily after supper.    Yes Historical Provider, MD  temazepam (RESTORIL) 15 MG capsule Take 15 mg by mouth at bedtime.     Yes Historical Provider, MD  terazosin (HYTRIN) 5 MG capsule Take 5 mg by mouth at bedtime.     Yes Historical Provider, MD  traMADol (ULTRAM) 50 MG tablet Take by mouth every 6 (six) hours as needed.   Yes Historical Provider, MD  gabapentin (NEURONTIN) 300 MG capsule Take 1 capsule (300 mg total) by mouth 3 (three) times daily. Patient not taking: Reported on 12/07/2013 11/30/11   Linus Mako, PA-C   BP 110/67 mmHg  Pulse 77  Temp(Src) 97.7 F (36.5 C) (Oral)  Resp 20  Ht 5\' 8"  (1.727 m)  Wt 199 lb 8.3 oz (90.5 kg)  BMI 30.34 kg/m2   SpO2 97% Physical Exam  Constitutional: He is oriented to person, place, and time. He appears well-developed and well-nourished.  HENT:  Head: Normocephalic and atraumatic.  Eyes: Conjunctivae and EOM are normal.  Neck: Normal range of motion. Neck supple.  Cardiovascular: Normal heart sounds.  An irregularly irregular rhythm present. Tachycardia present.   Pulmonary/Chest: Effort normal. No respiratory distress. He has rales in the right lower field and the left lower field.  Abdominal: He exhibits no distension. There is no tenderness. There is no rebound and no guarding.  Musculoskeletal: Normal range of motion.  Neurological: He is alert and oriented to person,  place, and time.  Skin: Skin is warm and dry.  Vitals reviewed.   ED Course  Procedures (including critical care time) Labs Review Labs Reviewed  CBC WITH DIFFERENTIAL - Abnormal; Notable for the following:    WBC 18.4 (*)    HCT 37.2 (*)    Neutrophils Relative % 89 (*)    Neutro Abs 16.3 (*)    Lymphocytes Relative 6 (*)    All other components within normal limits  COMPREHENSIVE METABOLIC PANEL - Abnormal; Notable for the following:    Sodium 127 (*)    Potassium 3.3 (*)    Chloride 85 (*)    Glucose, Bld 356 (*)    BUN 55 (*)    Creatinine, Ser 2.09 (*)    Albumin 3.3 (*)    GFR calc non Af Amer 28 (*)    GFR calc Af Amer 33 (*)    Anion gap 20 (*)    All other components within normal limits  PRO B NATRIURETIC PEPTIDE - Abnormal; Notable for the following:    Pro B Natriuretic peptide (BNP) 4485.0 (*)    All other components within normal limits  URINALYSIS, ROUTINE W REFLEX MICROSCOPIC - Abnormal; Notable for the following:    Glucose, UA 250 (*)    All other components within normal limits  BASIC METABOLIC PANEL - Abnormal; Notable for the following:    Sodium 130 (*)    Potassium 3.4 (*)    Chloride 89 (*)    Glucose, Bld 359 (*)    BUN 54 (*)    Creatinine, Ser 2.19 (*)    Calcium 8.3 (*)     GFR calc non Af Amer 27 (*)    GFR calc Af Amer 31 (*)    Anion gap 16 (*)    All other components within normal limits  CBC - Abnormal; Notable for the following:    RBC 4.10 (*)    Hemoglobin 12.3 (*)    HCT 35.2 (*)    Platelets 144 (*)    All other components within normal limits  GLUCOSE, CAPILLARY - Abnormal; Notable for the following:    Glucose-Capillary 331 (*)    All other components within normal limits  CULTURE, BLOOD (ROUTINE X 2)  CULTURE, BLOOD (ROUTINE X 2)  TROPONIN I  PROCALCITONIN  TROPONIN I  LIPID PANEL    Imaging Review Dg Chest 2 View  12/07/2013   CLINICAL DATA:  Two weeks of shortness of breath; history of chronic cough ; remote history of tobacco use.  EXAM: CHEST  2 VIEW  COMPARISON:  PA and lateral chest x-ray of January 26, 2013  FINDINGS: The lungs are adequately inflated. There is no focal infiltrate. There are coarse infrahilar lung markings on the right. There is no pleural effusion. The cardiac silhouette and pulmonary vascularity are normal. The trachea is midline. The observed bony thorax exhibits no acute abnormality.  IMPRESSION: Bronchitic changes without evidence of pneumonia, CHF, nor other acute cardiopulmonary abnormality.   Electronically Signed   By: David  Martinique   On: 12/07/2013 15:38   Ct Chest Wo Contrast  12/07/2013   CLINICAL DATA:  Three-week history of shortness of breath and dry cough  EXAM: CT CHEST WITHOUT CONTRAST  TECHNIQUE: Multidetector CT imaging of the chest was performed following the standard protocol without IV contrast material administration.  COMPARISON:  Chest radiograph December 07, 2013  FINDINGS: There is a small area of infiltrate in the lateral segment of the  left lower lobe. There is some associated atelectasis in this area. There is mild scarring in the inferior lung bases bilaterally. There is mild lower lobe bronchiectatic change bilaterally as well.  There is a total shoulder replacement on the left causing  artifact. There is no appreciable thoracic adenopathy. There is atherosclerotic change in the aorta but no aneurysm. There is no appreciable pulmonary embolus on this noncontrast enhanced study. There are multiple foci of coronary artery calcification. The pericardium is not thickened.  The  In the visualized upper abdomen, there is a parapelvic cyst in the right kidney measuring 3.2 x 2.6 cm.  There is degenerative change in the thoracic spine. There are no blastic or lytic bone lesions. There is spinal stenosis at L1-2 and L2-3, due to diffuse disc protrusion and bony hypertrophy. Thyroid appears unremarkable.  IMPRESSION: Small area of infiltrate in the lateral segment of the left lower lobe. There is associated subsegmental atelectasis in this area. Mild bibasilar lung scarring. There is relatively mild lower lobe bronchiectasis bilaterally.  No demonstrable adenopathy.  Multiple foci of coronary artery calcification.  Spinal stenosis at L1-2 and L2-3, multifactorial.   Electronically Signed   By: Lowella Grip M.D.   On: 12/07/2013 20:35     EKG Interpretation   Date/Time:  Wednesday December 07 2013 13:34:02 EST Ventricular Rate:  115 PR Interval:    QRS Duration: 102 QT Interval:  296 QTC Calculation: 409 R Axis:   50 Text Interpretation:  Atrial fibrillation with rapid ventricular response  Nonspecific ST abnormality Abnormal QRS-T angle, consider primary T wave  abnormality Abnormal ECG Atrial fibrillation is new since prior Confirmed  by Debby Freiberg [phone]) on 12/07/2013 2:01:42 PM      MDM   Final diagnoses:  Chronic cough  Shortness of breath    80 y.o. male with pertinent PMH of recent PNA CHF, DM, RA presents with continued and worsening dyspnea with concern for irregularly HR.  On arrival vitals and physical exam as above.  HR 110-140, afib with rvr.  Pt has no ho afib, and this was not evident on prior EKG.  No leg swelling or recent surgeries.  No recent fevers.     Labs with elevated cr.  RVR spontaneously resolved.  Consulted medicine for admission for new onset CHF and rvr.    1. Chronic cough   2. Shortness of breath         Debby Freiberg, MD 12/08/13 [phone]

## 2013-12-07 NOTE — ED Notes (Signed)
Pt reports having sob, recently admitted at [facility] for pneumonia. spo2 96%. Pt has home health nurse who sent him here due to irregular HR recently, EKG done at triage.

## 2013-12-07 NOTE — ED Notes (Signed)
Patient transported to X-ray 

## 2013-12-07 NOTE — Progress Notes (Signed)
Patient came to the floor around 1730, alert and oriented, no c/o pain, v/s stable. Afib on monitor rate 99-110. Will continue to monitor.

## 2013-12-07 NOTE — H&P (Signed)
Date: 12/07/2013               Patient Name:  [name] MRN: [id number]  DOB: July 18, 1930 Age / Sex: 80 y.o., male   PCP: [name], MD         Medical Service: Internal Medicine Teaching Service         Attending Physician: Dr. Annia Belt, MD    First Contact: Dr. Hulen Luster Pager: [phone]  Second Contact: Dr. Denton Brick Pager: [phone]       After Hours (After 5p/  First Contact Pager: [phone]  weekends / holidays): Second Contact Pager: [phone]   Chief Complaint: "can't catch full breath"  History of Present Illness: Pt is an 80 y/o male w/ PMHx of DM, HLD, HTN, hx of rt thyroid lobectomy, and RA who presented to the ED / w/ SOB and coughing. SOB has been present for the past 2-3 months and dry cough has been present for 3 weeks. He was recently admitted on 11/13 for PNA at [facility] and given IV abx and steroids. He was sent home on a Zpack, steroid dose pack, and cefdinir 300mg  BID x 5 days which he states he completed. Pt states his breathing did not improve on discharge and when the home health RN checked on him today she arranged for him to come to the hospital. Pt came to [facility] b/c he did not like [facility] received 3 breathing treatments w/o improvement in breathing. Pt denies fever, sore throat, weight gain, n/v, HA, joint pain, and pedal edema. States he has a hx of lung disease and was suppose to have left lung removed in the 1950's for unclear reasons. He has hx of thoracentesis in the distant past. He quit smoking in 1967 and previously smoked 1PPD for 15 years.   Pt was noted to have Afib w/ RVR (HR110-140) in the ED. Pt has been told by his family doctor that he has an irregular HR and has never been placed on aspirin. Pt is on cardizem 360mg  daily but does not know when it was started. Denies chest pain, chest palpitations. Endorses chest fullness b/l. He states he lost 13lbs during his admission for PNA 2/2 lasix and decreased appetite.     Meds: Current Facility-Administered Medications  Medication Dose Route Frequency Provider Last Rate Last Dose  . 0.9 % NaCl with KCl 20 mEq/ L  infusion   Intravenous Continuous Ejiroghene Arlyce Dice, MD      . aspirin EC tablet 81 mg  81 mg Oral Daily Julious Oka, MD      . diltiazem (CARDIZEM CD) 24 hr capsule 360 mg  360 mg Oral Daily Ejiroghene E Emokpae, MD      . famotidine (PEPCID) tablet 20 mg  20 mg Oral BID Ejiroghene E Emokpae, MD      . finasteride (PROSCAR) tablet 5 mg  5 mg Oral Daily Ejiroghene E Emokpae, MD      . gabapentin (NEURONTIN) capsule 300 mg  300 mg Oral TID Ejiroghene E Emokpae, MD      . heparin injection 5,000 Units  5,000 Units Subcutaneous 3 times per day Ejiroghene Arlyce Dice, MD      . HYDROcodone-acetaminophen (NORCO/VICODIN) 5-325 MG per tablet 1 tablet  1 tablet Oral Q6H PRN Ejiroghene Arlyce Dice, MD      . Derrill Memo ON 12/08/2013] insulin aspart (novoLOG) injection 0-9 Units  0-9 Units Subcutaneous TID WC Ejiroghene Arlyce Dice, MD      .  ipratropium-albuterol (DUONEB) 0.5-2.5 (3) MG/3ML nebulizer solution 3 mL  3 mL Nebulization Q6H PRN Ejiroghene E Emokpae, MD      . Methotrexate (PF) SOAJ 25 mg  25 mg Subcutaneous Weekly Ejiroghene E Emokpae, MD      . montelukast (SINGULAIR) tablet 10 mg  10 mg Oral QHS Ejiroghene E Emokpae, MD      . pravastatin (PRAVACHOL) tablet 40 mg  40 mg Oral Daily Ejiroghene E Emokpae, MD      . senna (SENOKOT) tablet 8.6 mg  1 tablet Oral BID Ejiroghene E Emokpae, MD      . tamsulosin (FLOMAX) capsule 0.4 mg  0.4 mg Oral QPC supper Ejiroghene Arlyce Dice, MD        Allergies: Allergies as of 12/07/2013  . (No Known Allergies)   Past Medical History  Diagnosis Date  . Anemia   . Hyperlipidemia     takes Pravastatin daily  . Hypertension     takes Cardizem,Proscar,and Lisinopril daily  . Peripheral neuropathy     takes Furosemide daily  . Joint pain   . Joint swelling   . GERD (gastroesophageal reflux disease)     takes  Zantac daily   . Constipation     takes stool softener daily  . History of blood transfusion     "related to one of my ORs"  . Impaired hearing     wears hearing aids  . Type II diabetes mellitus     takes Metformin daily  . Rheumatoid arthritis     "all over; take injections q 6 wks" (02/01/2013)   Past Surgical History  Procedure Laterality Date  . Total shoulder replacement Left 03/2010  . Shoulder arthroscopy w/ rotator cuff repair Right 1990's  . Repair tendons foot Left 1946; 1984    "farming accident; repaired tendons both times"  . Cataract extraction w/ intraocular lens  implant, bilateral Bilateral 1990's  . Total knee arthroplasty Right 12/16/2010    Procedure: TOTAL KNEE ARTHROPLASTY; rt Surgeon: Rudean Haskell, MD;  Location: [place];  Service: Orthopedics;  Laterality: Right;  . Ankle fusion Left 2014  . Tonsillectomy  1930's  . Colonoscopy    . Thyroidectomy, partial Right 02/01/2013  . Carpal tunnel release Bilateral 1990's  . Biopsy thyroid  2014  . Thyroidectomy Right 02/01/2013    Procedure: RIGHT THYROIDECTOMY LOBECTOMY;  Surgeon: Earnstine Regal, MD;  Location: [place];  Service: General;  Laterality: Right;  . Foreign body removal Right 09/06/2013    Procedure: REMOVAL FOREIGN BODY RIGHT INDEX FINGER;  Surgeon: Daryll Brod, MD;  Location: [place];  Service: Orthopedics;  Laterality: Right;  ANESTHESIA: IV REGIONAL FAB   Family History  Problem Relation Age of Onset  . Cancer Mother     breast   History   Social History  . Marital Status: Married    Spouse Name: N/A    Number of Children: N/A  . Years of Education: N/A   Occupational History  . Not on file.   Social History Main Topics  . Smoking status: Former Smoker -- 1.00 packs/day for 15 years    Types: Cigarettes    Quit date: 09/01/1965  . Smokeless tobacco: Never Used     Comment: quit smoking in 1967  . Alcohol Use: No     Comment: 02/01/2013 "stopped drinking in 1967; about an  alcoholic when I quit"  . Drug Use: No  . Sexual Activity: No   Other Topics Concern  .  Not on file   Social History Narrative    Review of Systems: Pertinent items are noted in HPI.  Physical Exam: Blood pressure 124/70, pulse 63, temperature 97.5 F (36.4 C), temperature source Oral, resp. rate 20, height 5\' 8"  (1.727 m), weight 199 lb 4.8 oz (90.402 kg), SpO2 99 %. General: NAD, hard of hearing, pleasant HEENT: dry mucous membranes Lungs: increase airway movement on rt vs left, egophany b/l lung bases, rt wheezing on anterior auscultation  Cardiac: irregular rate and rhythm, no murmurs GI: active bowel sounds, soft, nontender MSK: 5/5 UE and LE strength, lt ankle swollen and non tender Ext: negative pedal edema Neuro: CN 2-12 grossly intact   Lab results: Basic Metabolic Panel:  Recent Labs  12/07/13 1403  NA 127*  K 3.3*  CL 85*  CO2 22  GLUCOSE 356*  BUN 55*  CREATININE 2.09*  CALCIUM 8.7   Liver Function Tests:  Recent Labs  12/07/13 1403  AST 14  ALT 19  ALKPHOS 63  BILITOT 0.6  PROT 6.2  ALBUMIN 3.3*   CBC:  Recent Labs  12/07/13 1403  WBC 18.4*  NEUTROABS 16.3*  HGB 13.4  HCT 37.2*  MCV 84.2  PLT 152   Cardiac Enzymes:  Recent Labs  12/07/13 1403  TROPONINI <0.30   BNP:  Recent Labs  12/07/13 1405  PROBNP 4485.0*  Urinalysis:  Recent Labs  12/07/13 1415  COLORURINE YELLOW  LABSPEC 1.009  PHURINE 6.0  GLUCOSEU 250*  HGBUR NEGATIVE  BILIRUBINUR NEGATIVE  KETONESUR NEGATIVE  PROTEINUR NEGATIVE  UROBILINOGEN 0.2  NITRITE NEGATIVE  LEUKOCYTESUR NEGATIVE     Imaging results:  Dg Chest 2 View  12/07/2013   CLINICAL DATA:  Two weeks of shortness of breath; history of chronic cough ; remote history of tobacco use.  EXAM: CHEST  2 VIEW  COMPARISON:  PA and lateral chest x-ray of January 26, 2013  FINDINGS: The lungs are adequately inflated. There is no focal infiltrate. There are coarse infrahilar lung markings on the  right. There is no pleural effusion. The cardiac silhouette and pulmonary vascularity are normal. The trachea is midline. The observed bony thorax exhibits no acute abnormality.  IMPRESSION: Bronchitic changes without evidence of pneumonia, CHF, nor other acute cardiopulmonary abnormality.   Electronically Signed   By: David  Martinique   On: 12/07/2013 15:38   Ct Chest Wo Contrast  12/07/2013   CLINICAL DATA:  Three-week history of shortness of breath and dry cough  EXAM: CT CHEST WITHOUT CONTRAST  TECHNIQUE: Multidetector CT imaging of the chest was performed following the standard protocol without IV contrast material administration.  COMPARISON:  Chest radiograph December 07, 2013  FINDINGS: There is a small area of infiltrate in the lateral segment of the left lower lobe. There is some associated atelectasis in this area. There is mild scarring in the inferior lung bases bilaterally. There is mild lower lobe bronchiectatic change bilaterally as well.  There is a total shoulder replacement on the left causing artifact. There is no appreciable thoracic adenopathy. There is atherosclerotic change in the aorta but no aneurysm. There is no appreciable pulmonary embolus on this noncontrast enhanced study. There are multiple foci of coronary artery calcification. The pericardium is not thickened.  The  In the visualized upper abdomen, there is a parapelvic cyst in the right kidney measuring 3.2 x 2.6 cm.  There is degenerative change in the thoracic spine. There are no blastic or lytic bone lesions. There is spinal stenosis at  L1-2 and L2-3, due to diffuse disc protrusion and bony hypertrophy. Thyroid appears unremarkable.  IMPRESSION: Small area of infiltrate in the lateral segment of the left lower lobe. There is associated subsegmental atelectasis in this area. Mild bibasilar lung scarring. There is relatively mild lower lobe bronchiectasis bilaterally.  No demonstrable adenopathy.  Multiple foci of coronary artery  calcification.  Spinal stenosis at L1-2 and L2-3, multifactorial.   Electronically Signed   By: Lowella Grip M.D.   On: 12/07/2013 20:35    Other results: EKG: Afib w/ RVR, Ventricular rate 115, QTc 409  Assessment & Plan by Problem: Active Problems:   Diabetes   Essential hypertension   CHF (congestive heart failure)   Shortness of breath   Afib w/ RVR- triggers possibly thyroid disease- has hx of hemithyroidectomy on 02/01/13 (however would by hypothyroid), acute CHF, CHD- on pravastatin, valvular abnormalities - denies having an ECHO done. Pt does not know if cardizem is a new medicine that was started on d/c from [facility] or if has been on this in the past.  CHADS-VASc score 5 - continue cardizem 360mg  daily, cardizem was not reported filled by pharmacist - aspirin 81 - requesting records from [facility], will review lipid panel, if not done will order - will consider starting coumadin due to CHADS-VASc score of 5, will call PCP tomorrow to find out why pt was not on anticoagulation - trend one troponin  - ECHO ordered  Acute CHF- BNP on admission 4485( which could be elevated in setting of CKD4). CXR reveals bronchitic changes w/o PNA and acute cardiopulmonary abnormalitis. Denies cardiac hx, swelling in legs. Had a negative stress test 30 years ago, denies having a ECHO. Was recently started on lasix 40mg  BID during recent hospitalization  - troponin x 1 negative, will trend one more set - daily weights, strict I/O's - hold lasix, pt appears dehydrated  ?PNA- leukocytosis (WBC 18.4) which could also be 2/2 to steroid use, decreased breath sounds on the left vs rt on physical exam - CT chest w/o contrast  - will consider PFTs if CT chest negative - procalcitonin, CBC ordered  AKI on CKD4-- BUN 55, Cr 2.09, GFR 28, alb 3.3. Na 127 and K+ 3.3 State he was referred to [facility] but never required dialysis. Likely prerenal as pt only ate crackers since  arriving to the ED and BUN/Cr >20 - NS w/ KCL 20 mEq at 75cc/hr - will continue to monitor, BMET in the am  DM- called pharmacy and pt recently got metformin/glipitin 1000-2.5mg  filled - gabapentin 300mg  TID - hold home med, SSI sensitive   RA- home med listed as infliximab q6 weeks by Dr. Ouida Sills. Per pt he was recently taken off infliximab and started on surincia q30 days- will confirm once get medical records. - norco 5-325 q6h prn for pain  Urinary hesitancy- denies hx of BPH - continue home meds finasteride 5mg  and flomax 0.4mg   Diet: carb mod  DVT prophylaxis: heparin   Dispo: Disposition is deferred at this time, awaiting improvement of current medical problems. Anticipated discharge in approximately 2-3 day(s).   The patient does have a current PCP [name], MD) and does not need an [facility] hospital follow-up appointment after discharge.  The patient does not have transportation limitations that hinder transportation to clinic appointments.  Signed: Julious Oka, MD 12/07/2013, 8:40 PM

## 2013-12-07 NOTE — Progress Notes (Signed)
ANTIBIOTIC CONSULT NOTE - INITIAL  Pharmacy Consult for vancomycin and zosyn Indication: PNA   No Known Allergies  Patient Measurements: Height: 5\' 8"  (172.7 cm) Weight: 199 lb 4.8 oz (90.402 kg) (scale A) IBW/kg (Calculated) : 68.4   Vital Signs: Temp: 97.5 F (36.4 C) (11/25 1734) Temp Source: Oral (11/25 1734) BP: 124/70 mmHg (11/25 1734) Pulse Rate: 63 (11/25 1734) Intake/Output from previous day:   Intake/Output from this shift: Total I/O In: -  Out: 500 [Urine:500]  Labs:  Recent Labs  12/07/13 1403  WBC 18.4*  HGB 13.4  PLT 152  CREATININE 2.09*   Estimated Creatinine Clearance: 30.3 mL/min (by C-G formula based on Cr of 2.09). No results for input(s): VANCOTROUGH, VANCOPEAK, VANCORANDOM, GENTTROUGH, GENTPEAK, GENTRANDOM, TOBRATROUGH, TOBRAPEAK, TOBRARND, AMIKACINPEAK, AMIKACINTROU, AMIKACIN in the last 72 hours.   Microbiology: No results found for this or any previous visit (from the past 720 hour(s)).  Medical History: Past Medical History  Diagnosis Date  . Anemia   . Hyperlipidemia     takes Pravastatin daily  . Hypertension     takes Cardizem,Proscar,and Lisinopril daily  . Peripheral neuropathy     takes Furosemide daily  . Joint pain   . Joint swelling   . GERD (gastroesophageal reflux disease)     takes Zantac daily   . Constipation     takes stool softener daily  . History of blood transfusion     "related to one of my ORs"  . Impaired hearing     wears hearing aids  . Type II diabetes mellitus     takes Metformin daily  . Rheumatoid arthritis     "all over; take injections q 6 wks" (02/01/2013)    Medications:  Prescriptions prior to admission  Medication Sig Dispense Refill Last Dose  . diltiazem (CARDIZEM CD) 360 MG 24 hr capsule Take 360 mg by mouth daily.    11/30/2013  . Docusate Sodium (DSS) 100 MG CAPS Take 100 mg by mouth 2 (two) times daily.   11/30/2013  . finasteride (PROSCAR) 5 MG tablet Take 5 mg by mouth daily.    11/15/1592  . folic acid (FOLVITE) 1 MG tablet Take 1 mg by mouth daily.     11/30/2013  . furosemide (LASIX) 40 MG tablet Take 40 mg by mouth daily.   11/30/2013  . HYDROcodone-acetaminophen (NORCO) 5-325 MG per tablet Take 1 tablet by mouth every 6 (six) hours as needed for moderate pain. 30 tablet 0 2-3 weeks  . InFLIXimab (REMICADE IV) Inject into the vein every 6 (six) weeks. Dr Ouida Sills.   unknown  . ipratropium-albuterol (DUONEB) 0.5-2.5 (3) MG/3ML SOLN Take 3 mLs by nebulization every 6 (six) hours as needed (shortness of breath).     [name] lisinopril (PRINIVIL,ZESTRIL) 20 MG tablet Take 20 mg by mouth daily.     12/06/2013 at Unknown time  . metFORMIN (GLUCOPHAGE) 1000 MG tablet Take 1,000 mg by mouth 2 (two) times daily with a meal.     12/06/2013 at Unknown time  . Methotrexate, PF, 25 MG/0.4ML SOAJ Inject 25 mg into the skin once a week. On Sunday   12/04/2013  . montelukast (SINGULAIR) 10 MG tablet Take 10 mg by mouth at bedtime.     . pravastatin (PRAVACHOL) 40 MG tablet Take 40 mg by mouth daily.   12/06/2013 at Unknown time  . ranitidine (ZANTAC) 150 MG tablet Take 150 mg by mouth 2 (two) times daily.     11 /24/2015 at Unknown time  .  senna (SENOKOT) 8.6 MG TABS Take 1 tablet by mouth 2 (two) times daily.   12/06/2013 at Unknown time  . Tamsulosin HCl (FLOMAX) 0.4 MG CAPS Take 0.4 mg by mouth daily after supper.    12/06/2013 at Unknown time  . temazepam (RESTORIL) 15 MG capsule Take 15 mg by mouth at bedtime.     12/06/2013 at Unknown time  . terazosin (HYTRIN) 5 MG capsule Take 5 mg by mouth at bedtime.     12/06/2013 at Unknown time  . traMADol (ULTRAM) 50 MG tablet Take by mouth every 6 (six) hours as needed.   2-3 weeks  . gabapentin (NEURONTIN) 300 MG capsule Take 1 capsule (300 mg total) by mouth 3 (three) times daily. (Patient not taking: Reported on 12/07/2013) 15 capsule 0 09/05/2013 at Unknown time   Assessment: 80 yo man with recent PNA to start broad spectrum antibiotics.   His CrCl ~ 30 ml/min.  Goal of Therapy:  Vancomycin trough level 15-20 mcg/ml  Plan:  Zosyn 3.375 gm IV X 1 over 30 min then 3.375 gm IV q8 hours EI. Vancomycin 1750 mg IV X 1 then 1250 mg IV q24 hours F/u renal function, cultures and clinical course  Thanks for allowing pharmacy to be a part of this patient's care.  Excell Seltzer, PharmD Clinical Pharmacist, [phone] 12/07/2013,9:38 PM

## 2013-12-08 ENCOUNTER — Encounter (HOSPITAL_COMMUNITY): Payer: Self-pay | Admitting: *Deleted

## 2013-12-08 DIAGNOSIS — I509 Heart failure, unspecified: Secondary | ICD-10-CM

## 2013-12-08 DIAGNOSIS — R0600 Dyspnea, unspecified: Secondary | ICD-10-CM

## 2013-12-08 DIAGNOSIS — I4891 Unspecified atrial fibrillation: Secondary | ICD-10-CM

## 2013-12-08 HISTORY — DX: Unspecified atrial fibrillation: I48.91

## 2013-12-08 LAB — BASIC METABOLIC PANEL
Anion gap: 16 — ABNORMAL HIGH (ref 5–15)
BUN: 54 mg/dL — ABNORMAL HIGH (ref 6–23)
CO2: 25 meq/L (ref 19–32)
Calcium: 8.3 mg/dL — ABNORMAL LOW (ref 8.4–10.5)
Chloride: 89 mEq/L — ABNORMAL LOW (ref 96–112)
Creatinine, Ser: 2.19 mg/dL — ABNORMAL HIGH (ref 0.50–1.35)
GFR calc non Af Amer: 27 mL/min — ABNORMAL LOW (ref >90)
GFR, EST AFRICAN AMERICAN: 31 mL/min — AB (ref >90)
Glucose, Bld: 359 mg/dL — ABNORMAL HIGH (ref 70–99)
POTASSIUM: 3.4 meq/L — AB (ref 3.7–5.3)
SODIUM: 130 meq/L — AB (ref 137–147)

## 2013-12-08 LAB — CBC
HEMATOCRIT: 35.2 % — AB (ref 39.0–52.0)
HEMOGLOBIN: 12.3 g/dL — AB (ref 13.0–17.0)
MCH: 30 pg (ref 26.0–34.0)
MCHC: 34.9 g/dL (ref 30.0–36.0)
MCV: 85.9 fL (ref 78.0–100.0)
Platelets: 144 10*3/uL — ABNORMAL LOW (ref 150–400)
RBC: 4.1 MIL/uL — AB (ref 4.22–5.81)
RDW: 12.5 % (ref 11.5–15.5)
WBC: 9.1 10*3/uL (ref 4.0–10.5)

## 2013-12-08 LAB — GLUCOSE, CAPILLARY
Glucose-Capillary: 331 mg/dL — ABNORMAL HIGH (ref 70–99)
Glucose-Capillary: 147 mg/dL — ABNORMAL HIGH (ref 70–99)
Glucose-Capillary: 247 mg/dL — ABNORMAL HIGH (ref 70–99)
Glucose-Capillary: 168 mg/dL — ABNORMAL HIGH (ref 70–99)

## 2013-12-08 LAB — LIPID PANEL
CHOL/HDL RATIO: 2.8 ratio
Cholesterol: 142 mg/dL (ref 0–200)
HDL: 50 mg/dL (ref >39)
LDL Cholesterol: 63 mg/dL (ref 0–99)
Triglycerides: 144 mg/dL (ref <150)
VLDL: 29 mg/dL (ref 0–40)

## 2013-12-08 MED ORDER — POTASSIUM CHLORIDE CRYS ER 20 MEQ PO TBCR
20.0000 meq | EXTENDED_RELEASE_TABLET | Freq: Two times a day (BID) | ORAL | Status: DC
  Administered 2013-12-08 – 2013-12-10 (×6): 20 meq via ORAL
  Filled 2013-12-08 (×10): qty 1

## 2013-12-08 MED ORDER — INSULIN ASPART 100 UNIT/ML ~~LOC~~ SOLN
0.0000 [IU] | Freq: Three times a day (TID) | SUBCUTANEOUS | Status: DC
  Administered 2013-12-08: 5 [IU] via SUBCUTANEOUS
  Administered 2013-12-08: 2 [IU] via SUBCUTANEOUS
  Administered 2013-12-09: 3 [IU] via SUBCUTANEOUS
  Administered 2013-12-09: 11 [IU] via SUBCUTANEOUS
  Administered 2013-12-09: 8 [IU] via SUBCUTANEOUS
  Administered 2013-12-10: 15 [IU] via SUBCUTANEOUS
  Administered 2013-12-10: 5 [IU] via SUBCUTANEOUS

## 2013-12-08 NOTE — Plan of Care (Signed)
Problem: Phase I Progression Outcomes Goal: Dyspnea controlled at rest (HF) Outcome: Completed/Met Date Met:  12/08/13 Goal: Pain controlled with appropriate interventions Outcome: Completed/Met Date Met:  12/08/13 Goal: Up in chair, BRP Outcome: Completed/Met Date Met:  12/08/13 Goal: Voiding-avoid urinary catheter unless indicated Outcome: Completed/Met Date Met:  12/08/13

## 2013-12-08 NOTE — Progress Notes (Addendum)
Subjective: Pt states breathing is better than yesterday. States he has been continued on MTX despite being switched to [place] for RA. Use to work in the [place] but denies significant exposure to cleaning chemicals.   Objective: Vital signs in last 24 hours: Filed Vitals:   12/07/13 1645 12/07/13 1734 12/08/13 0210 12/08/13 0451  BP: 107/53 124/70 119/63 110/67  Pulse: 64 63 60 77  Temp:  97.5 F (36.4 C) 97.3 F (36.3 C) 97.7 F (36.5 C)  TempSrc:  Oral Oral Oral  Resp: 20 20 20 20   Height:  5\' 8"  (1.727 m)    Weight:  199 lb 4.8 oz (90.402 kg)  199 lb 8.3 oz (90.5 kg)  SpO2: 97% 99% 99% 97%   Weight change:   Intake/Output Summary (Last 24 hours) at 12/08/13 0959 Last data filed at 12/08/13 0800  Gross per 24 hour  Intake 1952.5 ml  Output   1950 ml  Net    2.5 ml   General: NAD, hard of hearing, pleasant HEENT: moist mucous membranes Lungs:lt basilar crackles, no wheezing  Cardiac: irregular rate and rhythm, no murmurs GI: active bowel sounds, soft, nontender Ext: negative pedal edema Neuro: CN 2-12 grossly intact   Lab Results: Basic Metabolic Panel:  Recent Labs Lab 12/07/13 1403 12/08/13 0348  NA 127* 130*  K 3.3* 3.4*  CL 85* 89*  CO2 22 25  GLUCOSE 356* 359*  BUN 55* 54*  CREATININE 2.09* 2.19*  CALCIUM 8.7 8.3*   Liver Function Tests:  Recent Labs Lab 12/07/13 1403  AST 14  ALT 19  ALKPHOS 63  BILITOT 0.6  PROT 6.2  ALBUMIN 3.3*    CBC:  Recent Labs Lab 12/07/13 1403 12/08/13 0348  WBC 18.4* 9.1  NEUTROABS 16.3*  --   HGB 13.4 12.3*  HCT 37.2* 35.2*  MCV 84.2 85.9  PLT 152 144*   Cardiac Enzymes:  Recent Labs Lab 12/07/13 1403 12/07/13 2042  TROPONINI <0.30 <0.30   BNP:  Recent Labs Lab 12/07/13 1405  PROBNP 4485.0*   CBG:  Recent Labs Lab 12/08/13 0452  GLUCAP 331*   Fasting Lipid Panel:  Recent Labs Lab 12/08/13 0348  CHOL 142  HDL 50  LDLCALC 63  TRIG 144  CHOLHDL 2.8    Urinalysis:  Recent Labs Lab 12/07/13 1415  COLORURINE YELLOW  LABSPEC 1.009  PHURINE 6.0  GLUCOSEU 250*  HGBUR NEGATIVE  BILIRUBINUR NEGATIVE  KETONESUR NEGATIVE  PROTEINUR NEGATIVE  UROBILINOGEN 0.2  NITRITE NEGATIVE  LEUKOCYTESUR NEGATIVE    Studies/Results: Dg Chest 2 View  12/07/2013   CLINICAL DATA:  Two weeks of shortness of breath; history of chronic cough ; remote history of tobacco use.  EXAM: CHEST  2 VIEW  COMPARISON:  PA and lateral chest x-ray of January 26, 2013  FINDINGS: The lungs are adequately inflated. There is no focal infiltrate. There are coarse infrahilar lung markings on the right. There is no pleural effusion. The cardiac silhouette and pulmonary vascularity are normal. The trachea is midline. The observed bony thorax exhibits no acute abnormality.  IMPRESSION: Bronchitic changes without evidence of pneumonia, CHF, nor other acute cardiopulmonary abnormality.   Electronically Signed   By: David  Martinique   On: 12/07/2013 15:38   Ct Chest Wo Contrast  12/07/2013   CLINICAL DATA:  Three-week history of shortness of breath and dry cough  EXAM: CT CHEST WITHOUT CONTRAST  TECHNIQUE: Multidetector CT imaging of the chest was performed following the standard protocol without  IV contrast material administration.  COMPARISON:  Chest radiograph December 07, 2013  FINDINGS: There is a small area of infiltrate in the lateral segment of the left lower lobe. There is some associated atelectasis in this area. There is mild scarring in the inferior lung bases bilaterally. There is mild lower lobe bronchiectatic change bilaterally as well.  There is a total shoulder replacement on the left causing artifact. There is no appreciable thoracic adenopathy. There is atherosclerotic change in the aorta but no aneurysm. There is no appreciable pulmonary embolus on this noncontrast enhanced study. There are multiple foci of coronary artery calcification. The pericardium is not thickened.   The  In the visualized upper abdomen, there is a parapelvic cyst in the right kidney measuring 3.2 x 2.6 cm.  There is degenerative change in the thoracic spine. There are no blastic or lytic bone lesions. There is spinal stenosis at L1-2 and L2-3, due to diffuse disc protrusion and bony hypertrophy. Thyroid appears unremarkable.  IMPRESSION: Small area of infiltrate in the lateral segment of the left lower lobe. There is associated subsegmental atelectasis in this area. Mild bibasilar lung scarring. There is relatively mild lower lobe bronchiectasis bilaterally.  No demonstrable adenopathy.  Multiple foci of coronary artery calcification.  Spinal stenosis at L1-2 and L2-3, multifactorial.   Electronically Signed   By: Lowella Grip M.D.   On: 12/07/2013 20:35   Medications: I have reviewed the patient's current medications. Scheduled Meds: . aspirin EC  81 mg Oral Daily  . diltiazem  30 mg Oral 4 times per day  . famotidine  20 mg Oral BID  . finasteride  5 mg Oral Daily  . gabapentin  300 mg Oral TID  . heparin  5,000 Units Subcutaneous 3 times per day  . insulin aspart  0-9 Units Subcutaneous TID WC  . montelukast  10 mg Oral QHS  . pravastatin  40 mg Oral Daily  . senna  1 tablet Oral BID  . tamsulosin  0.4 mg Oral QPC supper   Continuous Infusions:  PRN Meds:.HYDROcodone-acetaminophen, ipratropium-albuterol, temazepam Assessment/Plan: Active Problems:   Diabetes   Essential hypertension   CHF (congestive heart failure)   Shortness of breath   Afib w/ RVR- triggers possibly thyroid disease- has hx of hemithyroidectomy on 02/01/13 (however would be hypothyroid), acute CHF, CHD- on pravastatin, valvular abnormalities - denies having an ECHO done. Pt does not know if cardizem is a new medicine that was started on d/c from [facility] or if has been on this in the past. CHADS-VASc score 5 - continue cardizem 30mg  daily QID, HR in the high 90's to 110's. will need to verify dose  with daughter or records from [facility] - aspirin 81 - requesting records from [facility] - will consider starting coumadin due to CHADS-VASc score of 5, will call PCP tomorrow to find out why pt was not on anticoagulation, holding off initiating coumadin today - ECHO ordered  Acute CHF- BNP on admission 4485( which could be elevated in setting of CKD4). CXR reveals bronchitic changes w/o PNA and acute cardiopulmonary abnormalitis. Denies cardiac hx, swelling in legs. Had a negative stress test 30 years ago, denies having a ECHO. Was recently started on lasix 40mg  BID during recent hospitalization  - troponin x 2 negative - daily weights, strict I/O's, pt 199lbs down from 200 yesterday - hold lasix, pt appears dehydrated  Lt lower lobe infiltrate- leukocytosis (WBC 18.4) which could also be 2/2 to steroid use, decreased  breath sounds on the left vs rt on physical exam. Pt received dose of vanc/zosyn on admission.  - CT chest w/o contrast revealed lower left area of infiltrate not felt to be PNA - ordered PFTs if CT chest negative - procalcitonin WNL, WBCs trended down to 9.1 from 18.4 yesterday - blood cultures x 2 pending  AKI on CKD4-- BUN 55, Cr 2.09, GFR 28, alb 3.3. Na 127 and K+ 3.3 State he was referred to [facility] but never required dialysis. Likely prerenal - BUN/Cr >20 - d/c'd NS w/ KCL 20 mEq at 75cc/hr - Cr increased to 2.19 to 2.09, unsure what patient's b/l Cr is. Possible that lasix increased creatinine.Will need to get records from [facility].  - BMET in the am, will continue to monitor.  - supplemented low K+ w/ KDur 30mEq BID  DM- called pharmacy and pt recently got metformin/glipitin 1000-2.5mg  filled - hold home med, SSI sensitive   RA- home med listed as infliximab q6 weeks by Dr. Ouida Sills. Per pt he was recently taken off infliximab and started on orencia q30 days- will confirm once get medical records. - norco 5-325 q6h prn for pain -  stop MTX 2/2 pulmonary and renal toxicity  Urinary hesitancy- denies hx of BPH - continue home meds finasteride 5mg  and flomax 0.4mg   Diet: carb mod  DVT prophylaxis: heparin   Dispo: Disposition is deferred at this time, awaiting improvement of current medical problems. Anticipated discharge in approximately 2-3 day(s).   The patient does have a current PCP [name], MD) and does not need an [facility] hospital follow-up appointment after discharge.  The patient does not have transportation limitations that hinder transportation to clinic appointments. .Services Needed at time of discharge: Y = Yes, Blank = No PT:   OT:   RN:   Equipment:   Other:     LOS: 1 day   Julious Oka, MD 12/08/2013, 9:59 AM

## 2013-12-09 ENCOUNTER — Inpatient Hospital Stay (HOSPITAL_COMMUNITY): Payer: Medicare Other

## 2013-12-09 DIAGNOSIS — I059 Rheumatic mitral valve disease, unspecified: Secondary | ICD-10-CM

## 2013-12-09 LAB — PULMONARY FUNCTION TEST
DL/VA % PRED: 115 %
DL/VA: 5.16 ml/min/mmHg/L
DLCO COR: 26.76 ml/min/mmHg
DLCO UNC % PRED: 85 %
DLCO cor % pred: 90 %
DLCO unc: 25.21 ml/min/mmHg
FEF 25-75 Post: 2.82 L/sec
FEF 25-75 Pre: 2.05 L/sec
FEF2575-%CHANGE-POST: 37 %
FEF2575-%PRED-POST: 163 %
FEF2575-%PRED-PRE: 118 %
FEV1-%CHANGE-POST: 9 %
FEV1-%PRED-PRE: 90 %
FEV1-%Pred-Post: 98 %
FEV1-PRE: 2.32 L
FEV1-Post: 2.54 L
FEV1FVC-%Change-Post: 4 %
FEV1FVC-%Pred-Pre: 111 %
FEV6-%Change-Post: 5 %
FEV6-%Pred-Post: 91 %
FEV6-%Pred-Pre: 86 %
FEV6-Post: 3.08 L
FEV6-Pre: 2.91 L
FEV6FVC-%Change-Post: 0 %
FEV6FVC-%Pred-Post: 107 %
FEV6FVC-%Pred-Pre: 106 %
FVC-%CHANGE-POST: 4 %
FVC-%Pred-Post: 84 %
FVC-%Pred-Pre: 80 %
FVC-POST: 3.08 L
FVC-Pre: 2.94 L
POST FEV1/FVC RATIO: 82 %
PRE FEV1/FVC RATIO: 79 %
Post FEV6/FVC ratio: 100 %
Pre FEV6/FVC Ratio: 99 %
RV % pred: 114 %
RV: 2.95 L
TLC % pred: 89 %
TLC: 5.95 L

## 2013-12-09 LAB — CBC
HEMATOCRIT: 36.4 % — AB (ref 39.0–52.0)
Hemoglobin: 12.7 g/dL — ABNORMAL LOW (ref 13.0–17.0)
MCH: 30.2 pg (ref 26.0–34.0)
MCHC: 34.9 g/dL (ref 30.0–36.0)
MCV: 86.5 fL (ref 78.0–100.0)
PLATELETS: 141 10*3/uL — AB (ref 150–400)
RBC: 4.21 MIL/uL — ABNORMAL LOW (ref 4.22–5.81)
RDW: 12.8 % (ref 11.5–15.5)
WBC: 10.6 10*3/uL — ABNORMAL HIGH (ref 4.0–10.5)

## 2013-12-09 LAB — BASIC METABOLIC PANEL
ANION GAP: 13 (ref 5–15)
BUN: 48 mg/dL — ABNORMAL HIGH (ref 6–23)
CO2: 27 meq/L (ref 19–32)
Calcium: 8.8 mg/dL (ref 8.4–10.5)
Chloride: 98 mEq/L (ref 96–112)
Creatinine, Ser: 2.24 mg/dL — ABNORMAL HIGH (ref 0.50–1.35)
GFR calc Af Amer: 30 mL/min — ABNORMAL LOW (ref >90)
GFR, EST NON AFRICAN AMERICAN: 26 mL/min — AB (ref >90)
Glucose, Bld: 150 mg/dL — ABNORMAL HIGH (ref 70–99)
Potassium: 3.4 mEq/L — ABNORMAL LOW (ref 3.7–5.3)
SODIUM: 138 meq/L (ref 137–147)

## 2013-12-09 LAB — PROCALCITONIN

## 2013-12-09 LAB — GLUCOSE, CAPILLARY
GLUCOSE-CAPILLARY: 191 mg/dL — AB (ref 70–99)
Glucose-Capillary: 314 mg/dL — ABNORMAL HIGH (ref 70–99)
Glucose-Capillary: 262 mg/dL — ABNORMAL HIGH (ref 70–99)

## 2013-12-09 MED ORDER — ALBUTEROL SULFATE (2.5 MG/3ML) 0.083% IN NEBU
2.5000 mg | INHALATION_SOLUTION | Freq: Once | RESPIRATORY_TRACT | Status: AC
  Administered 2013-12-09: 2.5 mg via RESPIRATORY_TRACT

## 2013-12-09 MED ORDER — GUAIFENESIN 200 MG PO TABS
200.0000 mg | ORAL_TABLET | ORAL | Status: DC
  Administered 2013-12-09 – 2013-12-13 (×20): 200 mg via ORAL
  Filled 2013-12-09 (×33): qty 1

## 2013-12-09 MED ORDER — FAMOTIDINE 20 MG PO TABS
20.0000 mg | ORAL_TABLET | Freq: Every day | ORAL | Status: DC
  Administered 2013-12-09 – 2013-12-13 (×5): 20 mg via ORAL
  Filled 2013-12-09 (×5): qty 1

## 2013-12-09 MED ORDER — DILTIAZEM HCL 90 MG PO TABS
90.0000 mg | ORAL_TABLET | Freq: Four times a day (QID) | ORAL | Status: DC
  Administered 2013-12-09 – 2013-12-10 (×2): 90 mg via ORAL
  Filled 2013-12-09 (×6): qty 1

## 2013-12-09 MED ORDER — DILTIAZEM HCL 30 MG PO TABS: 30.0000 mg | ORAL_TABLET | Freq: Four times a day (QID) | ORAL | Status: DC

## 2013-12-09 MED ORDER — DILTIAZEM HCL 60 MG PO TABS: 60.0000 mg | ORAL_TABLET | Freq: Four times a day (QID) | ORAL | Status: DC

## 2013-12-09 MED ORDER — DILTIAZEM HCL 60 MG PO TABS
60.0000 mg | ORAL_TABLET | Freq: Four times a day (QID) | ORAL | Status: DC
  Administered 2013-12-09 (×2): 60 mg via ORAL
  Filled 2013-12-09 (×4): qty 1

## 2013-12-09 NOTE — Progress Notes (Signed)
  Echocardiogram 2D Echocardiogram has been performed.  [name] 12/09/2013, 11:09 AM

## 2013-12-09 NOTE — Care Management Note (Signed)
    Page 1 of 2   12/12/2013     11:25:42 AM CARE MANAGEMENT NOTE 12/12/2013  Patient:  [name], [name]   Account Number:  [id number]  Date Initiated:  12/09/2013  Documentation initiated by:  AMERSON,JULIE  Subjective/Objective Assessment:   Pt adm on 12/07/13 with afib with RVR, LLL infiltrate, and AKI.  PTA, pt resided at home with spouse, who has dementia.     Action/Plan:   Will follow for dc needs as pt progresses.   Anticipated DC Date:  12/11/2013   Anticipated DC Plan:  [place]  CM consult      [facility] Choice  HOME HEALTH   Choice offered to / List presented to:  C-1 Patient        [place] arranged  HH-1 RN  [place] [facility]      [place].   Status of service:  Completed, signed off Medicare Important Message given?  YES (If response is "NO", the following Medicare IM given date fields will be blank) Date Medicare IM given:  12/12/2013 Medicare IM given by:  Geovonni Meyerhoff Date Additional Medicare IM given:   Additional Medicare IM given by:    Discharge Disposition:  [place]  Per UR Regulation:  Reviewed for med. necessity/level of care/duration of stay  If discussed at [place] of Stay Meetings, dates discussed:   12/13/2013    Comments:  Mariann Laster RN, BSN, MSHL, CCM  Nurse - Case Manager,  ([facility])  [phone]  12/12/2013 Social: From home with wife.  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 PT RECS:  HH PT Dispo Plan:  HHS:  RN, PT ([place] / [facility] notified)

## 2013-12-09 NOTE — Evaluation (Addendum)
Physical Therapy Evaluation Patient Details Name: [name] MRN: [id number] DOB: October 05, 1930 Today's Date: 12/09/2013   History of Present Illness  Patient is a 80 y/o male who presents with persistent dyspnea and found to have afib with RVR in ED. CT chest- LLL infiltrate. Recently admitted 11/13 to [facility] w/ a three-week history of cough and a 2 to three-month history of increasing dyspnea. He was treated with bronchodilators, parenteral antibiotics, and steroids. PMH of essential hypertension, type 2 diabetes, chronic renal insufficiency, rheumatoid arthritis on chronic methotrexate therapy and anti-TNF therapy. He has a history of a hemithyroidectomy    Clinical Impression  Patient presents with functional limitations due to deficits listed in PT problem list (see below). Pt with dyspnea on exertion, abnormally elevated HR, balance deficits and impaired cardiovascular endurance. Pt with 1 LOB during gait requiring Min A to prevent fall. Education provided to perform short bouts of activity for energy conservation. Pt would benefit from skilled PT to improve gait, balance, endurance and safe mobility so pt can maximize independence and return to PLOF.     Follow Up Recommendations Home health PT;Supervision for mobility/OOB    Equipment Recommendations  None recommended by PT    Recommendations for Other Services       Precautions / Restrictions Precautions Precautions: Fall Precaution Comments: monitor HR Restrictions Weight Bearing Restrictions: No      Mobility  Bed Mobility               General bed mobility comments: Received sitting in chair upon PT arrival.   Transfers Overall transfer level: Needs assistance Equipment used: None Transfers: Sit to/from Stand Sit to Stand: Supervision         General transfer comment: Supervision for safety.   Ambulation/Gait Ambulation/Gait assistance: Min assist Ambulation Distance (Feet): 200  Feet Assistive device: None Gait Pattern/deviations: Step-through pattern;Wide base of support;Decreased stride length Gait velocity: 1.5 ft/sec Gait velocity interpretation: <1.8 ft/sec, indicative of risk for recurrent falls General Gait Details: Pt with waddling like gait pattern with wide BoS and increased hip flexion to advance BLEs. 1 LOB veering to right when distracted using rail/therapist to prevent fall. Dyspnea present. Elevated HR ranging from 120s-150s. RN aware. Just given cardizem prior to PT arrival.   Science writer    Modified Rankin (Stroke Patients Only)       Balance Overall balance assessment: Needs assistance Sitting-balance support: Feet supported;No upper extremity supported Sitting balance-Leahy Scale: Good     Standing balance support: During functional activity Standing balance-Leahy Scale: Fair Standing balance comment: MIldly unsteady during gait with 1 LOB requiring Min A to prevent fall.                             Pertinent Vitals/Pain Pain Assessment: No/denies pain    Home Living Family/patient expects to be discharged to:: Private residence Living Arrangements: Spouse/significant other Available Help at Discharge: Available 24 hours/day Type of Home: House Home Access: Ramped entrance     Home Layout: One level Home Equipment: Walker - 2 wheels      Prior Function Level of Independence: Independent         Comments: Pt very active PTA. Does all household chores, cooking, driving. Wife has dementia.      Hand Dominance        Extremity/Trunk Assessment   Upper Extremity Assessment: Overall  WFL for tasks assessed           Lower Extremity Assessment: Generalized weakness         Communication   Communication: HOH  Cognition Arousal/Alertness: Awake/alert Behavior During Therapy: WFL for tasks assessed/performed Overall Cognitive Status: Within Functional Limits for tasks  assessed                      General Comments      Exercises        Assessment/Plan    PT Assessment Patient needs continued PT services  PT Diagnosis Difficulty walking;Generalized weakness   PT Problem List Cardiopulmonary status limiting activity;Decreased strength;Decreased activity tolerance;Decreased balance;Decreased mobility  PT Treatment Interventions Balance training;Gait training;Patient/family education;Functional mobility training;Therapeutic activities;Therapeutic exercise;DME instruction   PT Goals (Current goals can be found in the Care Plan section) Acute Rehab PT Goals Patient Stated Goal: to get stronger PT Goal Formulation: With patient Time For Goal Achievement: 12/23/13 Potential to Achieve Goals: Good    Frequency Min 3X/week   Barriers to discharge Decreased caregiver support      Co-evaluation               End of Session Equipment Utilized During Treatment: Gait belt Activity Tolerance: Patient tolerated treatment well Patient left: in bed;with call bell/phone within reach;with family/visitor present Nurse Communication: Mobility status;Precautions         Time: 9379-0240 PT Time Calculation (min) (ACUTE ONLY): 14 min   Charges:   PT Evaluation $Initial PT Evaluation Tier I: 1 Procedure PT Treatments $Gait Training: 8-22 mins   PT G CodesCandy Sledge A 12/09/2013, 3:13 PM  Candy Sledge, [place], DPT [phone]

## 2013-12-09 NOTE — Progress Notes (Addendum)
Subjective: Pt feeling better. Complaining of not being able to cough up sputum.   Overnight tele revealed HR up to 147.   Update: Records from [facility] received, pertinent information noted under assessment and plan.   Objective: Vital signs in last 24 hours: Filed Vitals:   12/08/13 2111 12/08/13 2311 12/09/13 0203 12/09/13 0538  BP: 112/68 113/51 116/72 152/71  Pulse: 69  71 52  Temp: 97.5 F (36.4 C)  97.8 F (36.6 C) 97.9 F (36.6 C)  TempSrc: Oral  Oral Oral  Resp: 18  18 18   Height:      Weight:    200 lb 11.2 oz (91.037 kg)  SpO2: 100%  100% 98%   Weight change: 11.2 oz (0.318 kg)  Intake/Output Summary (Last 24 hours) at 12/09/13 0854 Last data filed at 12/09/13 0842  Gross per 24 hour  Intake    530 ml  Output   2275 ml  Net  -1745 ml   General: NAD, hard of hearing, pleasant HEENT: moist mucous membranes Lungs: CTAB Cardiac: irregular rate and rhythm, no murmurs GI: active bowel sounds, soft, nontender Ext: negative pedal edema Neuro: CN 2-12 grossly intact   Lab Results: Basic Metabolic Panel:  Recent Labs Lab 12/07/13 1403 12/08/13 0348  NA 127* 130*  K 3.3* 3.4*  CL 85* 89*  CO2 22 25  GLUCOSE 356* 359*  BUN 55* 54*  CREATININE 2.09* 2.19*  CALCIUM 8.7 8.3*   Liver Function Tests:  Recent Labs Lab 12/07/13 1403  AST 14  ALT 19  ALKPHOS 63  BILITOT 0.6  PROT 6.2  ALBUMIN 3.3*    CBC:  Recent Labs Lab 12/07/13 1403 12/08/13 0348  WBC 18.4* 9.1  NEUTROABS 16.3*  --   HGB 13.4 12.3*  HCT 37.2* 35.2*  MCV 84.2 85.9  PLT 152 144*   Cardiac Enzymes:  Recent Labs Lab 12/07/13 1403 12/07/13 2042  TROPONINI <0.30 <0.30   BNP:  Recent Labs Lab 12/07/13 1405  PROBNP 4485.0*   CBG:  Recent Labs Lab 12/08/13 0452 12/08/13 1222 12/08/13 1639 12/08/13 2115 12/09/13 0559  GLUCAP 331* 147* 247* 168* 314*   Fasting Lipid Panel:  Recent Labs Lab 12/08/13 0348  CHOL 142  HDL 50  LDLCALC 63  TRIG  144  CHOLHDL 2.8   Urinalysis:  Recent Labs Lab 12/07/13 1415  COLORURINE YELLOW  LABSPEC 1.009  PHURINE 6.0  GLUCOSEU 250*  HGBUR NEGATIVE  BILIRUBINUR NEGATIVE  KETONESUR NEGATIVE  PROTEINUR NEGATIVE  UROBILINOGEN 0.2  NITRITE NEGATIVE  LEUKOCYTESUR NEGATIVE    Studies/Results: Dg Chest 2 View  12/07/2013   CLINICAL DATA:  Two weeks of shortness of breath; history of chronic cough ; remote history of tobacco use.  EXAM: CHEST  2 VIEW  COMPARISON:  PA and lateral chest x-ray of January 26, 2013  FINDINGS: The lungs are adequately inflated. There is no focal infiltrate. There are coarse infrahilar lung markings on the right. There is no pleural effusion. The cardiac silhouette and pulmonary vascularity are normal. The trachea is midline. The observed bony thorax exhibits no acute abnormality.  IMPRESSION: Bronchitic changes without evidence of pneumonia, CHF, nor other acute cardiopulmonary abnormality.   Electronically Signed   By: David  Martinique   On: 12/07/2013 15:38   Ct Chest Wo Contrast  12/07/2013   CLINICAL DATA:  Three-week history of shortness of breath and dry cough  EXAM: CT CHEST WITHOUT CONTRAST  TECHNIQUE: Multidetector CT imaging of the chest was performed  following the standard protocol without IV contrast material administration.  COMPARISON:  Chest radiograph December 07, 2013  FINDINGS: There is a small area of infiltrate in the lateral segment of the left lower lobe. There is some associated atelectasis in this area. There is mild scarring in the inferior lung bases bilaterally. There is mild lower lobe bronchiectatic change bilaterally as well.  There is a total shoulder replacement on the left causing artifact. There is no appreciable thoracic adenopathy. There is atherosclerotic change in the aorta but no aneurysm. There is no appreciable pulmonary embolus on this noncontrast enhanced study. There are multiple foci of coronary artery calcification. The pericardium  is not thickened.  The  In the visualized upper abdomen, there is a parapelvic cyst in the right kidney measuring 3.2 x 2.6 cm.  There is degenerative change in the thoracic spine. There are no blastic or lytic bone lesions. There is spinal stenosis at L1-2 and L2-3, due to diffuse disc protrusion and bony hypertrophy. Thyroid appears unremarkable.  IMPRESSION: Small area of infiltrate in the lateral segment of the left lower lobe. There is associated subsegmental atelectasis in this area. Mild bibasilar lung scarring. There is relatively mild lower lobe bronchiectasis bilaterally.  No demonstrable adenopathy.  Multiple foci of coronary artery calcification.  Spinal stenosis at L1-2 and L2-3, multifactorial.   Electronically Signed   By: Lowella Grip M.D.   On: 12/07/2013 20:35   Medications: I have reviewed the patient's current medications. Scheduled Meds: . aspirin EC  81 mg Oral Daily  . diltiazem  30 mg Oral 4 times per day  . famotidine  20 mg Oral BID  . finasteride  5 mg Oral Daily  . gabapentin  300 mg Oral TID  . heparin  5,000 Units Subcutaneous 3 times per day  . insulin aspart  0-15 Units Subcutaneous TID WC  . montelukast  10 mg Oral QHS  . potassium chloride  20 mEq Oral BID  . pravastatin  40 mg Oral Daily  . senna  1 tablet Oral BID  . tamsulosin  0.4 mg Oral QPC supper   Continuous Infusions:  PRN Meds:.HYDROcodone-acetaminophen, ipratropium-albuterol, temazepam Assessment/Plan: Active Problems:   Diabetes   Essential hypertension   CHF (congestive heart failure)   Shortness of breath   Atrial fibrillation   Afib w/ RVR-CHADS-VASc score 5, prior EKG on 01/2013 and 12/2010 revealed normal sinus rhythm, however pt was told in the past he has an irregular heart beat. Paroxysmal vs new onset A fib unclear at this time.  - cardizem increased from 30 QID to 60mg  QID 2/2 increased HR up to the 140's. HR 102 during exam today  - pt was noted to be in Afib during ECHO on  11/29/13 at [facility] ECHO further revealed severe concentric lt ventricular hypertrophy, EF 50-55%, rt ventricular septal wall flattened in diastole consistent w/ rt ventricular vol overload. Was noted to be on cardizem 360mg  QD since 08/05/13. BNP on admission to [place] [facility] on 11/13 was 10600, BNP was 4485 here on admission on 12/07/13. Pt was started on lasix during that admission w/ 2.5L diuresed w/in 24hrs. - aspirin 81 - will need coumadin due to CHADS-VASc score of 5, likely will hold off on initiating coumadin while inpatient as patient will need f/u appts for INR checks and may not be possible over the weekend to set these and ensure appropriate f/u. Will try to get in touch w/ PCP to find out why pt was not  on anticoagulation previously.  - repeat ECHO done today, waiting on read.  - will refer to Dr. Bettina Gavia with [facility] in [place], [place]. Office number [phone]  Lt lower lobe infiltrate- leukocytosis (WBC 18.4) which could also be 2/2 to steroid use, decreased breath sounds on the left vs rt on physical exam. Pt received dose of vanc/zosyn on admission.  - CT chest w/o contrast revealed lower left area of infiltrate not felt to be PNA -  PFTs and 15min walk today, if abnormal will have pt f/u w/ pulmonology - has been referred to Dr. Joya Gaskins (also who his wife sees) while at [place].  - blood cultures x 2 NGTD  AKI on CKD4-- BUN 55, Cr 2.09, GFR 28, alb 3.3. Na 127 and K+ 3.3 State he was referred to [facility] but never required dialysis. Likely prerenal - BUN/Cr >20 - d/c'd NS w/ KCL 20 mEq at 75cc/hr - Cr trending up since admission (2.09-->2.19-->2.24 today) unsure what patient's b/l Cr is.  - records from [facility] reveal Cr of 1.6 while admitted, pt got a renal u/s due to elevated Cr which revealed lt>rt renal cortical thinning and increased renal parenchymal echogenicity b/l consistent w/ medical renal disease, simple cyst b/l, prostatic  enlargement.  - will have pt f/u with nephrology outpatient  - KDur 71mEq BID for K+ on 3.4  DM- called pharmacy and pt recently got metformin/glipitin 1000-2.5mg  filled - hold home med, SSI sensitive   RA- home med listed as infliximab q6 weeks by Dr. Ouida Sills. Per pt he was recently taken off infliximab and started on orencia q30 days- will confirm once get medical records. - norco 5-325 q6h prn for pain  Urinary hesitancy- denies hx of BPH - continue home meds finasteride 5mg  and flomax 0.4mg   Diet: carb mod  DVT prophylaxis: heparin   Dispo: Disposition is deferred at this time, awaiting improvement of current medical problems. Anticipated discharge in approximately 2-3 day(s).   The patient does have a current PCP [name], MD) and does not need an [facility] hospital follow-up appointment after discharge.  The patient does not have transportation limitations that hinder transportation to clinic appointments. .Services Needed at time of discharge: Y = Yes, Blank = No PT:   OT:   RN:   Equipment:   Other:     LOS: 2 days   Julious Oka, MD 12/09/2013, 8:54 AM

## 2013-12-09 NOTE — Plan of Care (Signed)
Problem: Phase I Progression Outcomes Goal: EF % per last Echo/documented,Core Reminder form on chart Outcome: Progressing Goal: Initial discharge plan identified Outcome: Progressing Goal: Hemodynamically stable Outcome: Completed/Met Date Met:  12/09/13  Problem: Phase II Progression Outcomes Goal: Pain controlled Outcome: Completed/Met Date Met:  12/09/13 Goal: Dyspnea controlled with activity Outcome: Completed/Met Date Met:  12/09/13

## 2013-12-10 DIAGNOSIS — I119 Hypertensive heart disease without heart failure: Secondary | ICD-10-CM | POA: Clinically undetermined

## 2013-12-10 LAB — GLUCOSE, CAPILLARY
Glucose-Capillary: 229 mg/dL — ABNORMAL HIGH (ref 70–99)
Glucose-Capillary: 420 mg/dL — ABNORMAL HIGH (ref 70–99)
Glucose-Capillary: 129 mg/dL — ABNORMAL HIGH (ref 70–99)
Glucose-Capillary: 189 mg/dL — ABNORMAL HIGH (ref 70–99)

## 2013-12-10 LAB — BASIC METABOLIC PANEL
ANION GAP: 14 (ref 5–15)
BUN: 46 mg/dL — ABNORMAL HIGH (ref 6–23)
CO2: 26 mEq/L (ref 19–32)
Calcium: 8.5 mg/dL (ref 8.4–10.5)
Chloride: 94 mEq/L — ABNORMAL LOW (ref 96–112)
Creatinine, Ser: 2.05 mg/dL — ABNORMAL HIGH (ref 0.50–1.35)
GFR calc Af Amer: 33 mL/min — ABNORMAL LOW (ref >90)
GFR calc non Af Amer: 29 mL/min — ABNORMAL LOW (ref >90)
Glucose, Bld: 210 mg/dL — ABNORMAL HIGH (ref 70–99)
POTASSIUM: 3.7 meq/L (ref 3.7–5.3)
Sodium: 134 mEq/L — ABNORMAL LOW (ref 137–147)

## 2013-12-10 LAB — CBC WITH DIFFERENTIAL/PLATELET
BASOS ABS: 0 10*3/uL (ref 0.0–0.1)
Basophils Relative: 0 % (ref 0–1)
Eosinophils Absolute: 0.3 10*3/uL (ref 0.0–0.7)
Eosinophils Relative: 3 % (ref 0–5)
HCT: 34.1 % — ABNORMAL LOW (ref 39.0–52.0)
Hemoglobin: 11.7 g/dL — ABNORMAL LOW (ref 13.0–17.0)
Lymphocytes Relative: 26 % (ref 12–46)
Lymphs Abs: 2.5 10*3/uL (ref 0.7–4.0)
MCH: 29.9 pg (ref 26.0–34.0)
MCHC: 34.3 g/dL (ref 30.0–36.0)
MCV: 87.2 fL (ref 78.0–100.0)
Monocytes Absolute: 0.8 10*3/uL (ref 0.1–1.0)
Monocytes Relative: 9 % (ref 3–12)
NEUTROS ABS: 5.9 10*3/uL (ref 1.7–7.7)
Neutrophils Relative %: 62 % (ref 43–77)
Platelets: 139 10*3/uL — ABNORMAL LOW (ref 150–400)
RBC: 3.91 MIL/uL — ABNORMAL LOW (ref 4.22–5.81)
RDW: 12.9 % (ref 11.5–15.5)
WBC: 9.6 10*3/uL (ref 4.0–10.5)

## 2013-12-10 LAB — HEMOGLOBIN A1C
HEMOGLOBIN A1C: 8.8 % — AB (ref <5.7)
Mean Plasma Glucose: 206 mg/dL — ABNORMAL HIGH (ref <117)

## 2013-12-10 LAB — PRO B NATRIURETIC PEPTIDE: PRO B NATRI PEPTIDE: 2284 pg/mL — AB (ref 0–450)

## 2013-12-10 MED ORDER — CARVEDILOL 6.25 MG PO TABS
6.2500 mg | ORAL_TABLET | Freq: Two times a day (BID) | ORAL | Status: DC
  Administered 2013-12-10 – 2013-12-12 (×4): 6.25 mg via ORAL
  Filled 2013-12-10 (×6): qty 1

## 2013-12-10 MED ORDER — INSULIN GLARGINE 100 UNIT/ML ~~LOC~~ SOLN
10.0000 [IU] | Freq: Every day | SUBCUTANEOUS | Status: DC
  Administered 2013-12-10 – 2013-12-13 (×4): 10 [IU] via SUBCUTANEOUS
  Filled 2013-12-10 (×5): qty 0.1

## 2013-12-10 MED ORDER — GABAPENTIN 300 MG PO CAPS
300.0000 mg | ORAL_CAPSULE | Freq: Two times a day (BID) | ORAL | Status: DC
  Administered 2013-12-10 – 2013-12-13 (×6): 300 mg via ORAL
  Filled 2013-12-10 (×7): qty 1

## 2013-12-10 MED ORDER — RIVAROXABAN 15 MG PO TABS
15.0000 mg | ORAL_TABLET | Freq: Every day | ORAL | Status: DC
  Administered 2013-12-10: 15 mg via ORAL
  Filled 2013-12-10 (×2): qty 1

## 2013-12-10 MED ORDER — INSULIN ASPART 100 UNIT/ML ~~LOC~~ SOLN
0.0000 [IU] | Freq: Three times a day (TID) | SUBCUTANEOUS | Status: DC
  Administered 2013-12-10: 3 [IU] via SUBCUTANEOUS
  Administered 2013-12-11: 5 [IU] via SUBCUTANEOUS
  Administered 2013-12-11: 11 [IU] via SUBCUTANEOUS
  Administered 2013-12-12 – 2013-12-13 (×4): 4 [IU] via SUBCUTANEOUS
  Administered 2013-12-13: 3 [IU] via SUBCUTANEOUS

## 2013-12-10 MED ORDER — DILTIAZEM HCL 100 MG IV SOLR
10.0000 mg/h | INTRAVENOUS | Status: DC
  Administered 2013-12-10 – 2013-12-11 (×2): 10 mg/h via INTRAVENOUS
  Filled 2013-12-10 (×2): qty 100

## 2013-12-10 MED ORDER — AMIODARONE HCL 200 MG PO TABS
400.0000 mg | ORAL_TABLET | Freq: Three times a day (TID) | ORAL | Status: DC
  Administered 2013-12-10 – 2013-12-13 (×9): 400 mg via ORAL
  Filled 2013-12-10 (×12): qty 2

## 2013-12-10 MED ORDER — FUROSEMIDE 10 MG/ML IJ SOLN
40.0000 mg | Freq: Two times a day (BID) | INTRAMUSCULAR | Status: AC
  Administered 2013-12-10 – 2013-12-11 (×4): 40 mg via INTRAVENOUS
  Filled 2013-12-10 (×2): qty 4

## 2013-12-10 MED ORDER — ISOSORB DINITRATE-HYDRALAZINE 20-37.5 MG PO TABS
1.0000 | ORAL_TABLET | Freq: Three times a day (TID) | ORAL | Status: DC
  Administered 2013-12-10 – 2013-12-13 (×11): 1 via ORAL
  Filled 2013-12-10 (×12): qty 1

## 2013-12-10 NOTE — Consult Note (Signed)
CARDIOLOGY CONSULT NOTE  Patient ID: [name] MRN: [id number] DOB/AGE: [birth year] 80 y.o.  Admit date: 12/07/2013 Referring Physician  Murriel Hopper, MD Primary Physician:  [name], MD Reason for Consultation  A. fib  HPI: [name] is an 80 year old Caucasian male with a history of HTN, DM Type II, chronic renal insuffiency, RA, and s/p hemithyroidectomy who was admitted on 12/07/2013 for SOB. He was recently admitted last week to [facility] for same and was treated for PNA and d/c home without improvement in symptoms.  He presented to [facility] ED for continued SOB and was noted to be in a.fib with RVR.  Rate improved spontaneously. He states he has chronic lung disease but has noticed decline in activity tolerance and increased SOB with exertion over the past 2-3 months.  He denies chest pain or palpitations.  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  He is completely fatigued by 2 or 3 in the afternoon.  He denies PND or orthopnea, he wife states she often notices him gasp for breath at night but denies snoring otherwise.  He has never been evaluated for sleep apnea.  He has been told he has had an irregular heart beat in the past but has never been told he has a. Fib. He was noted to be in Afib during Echo on 11/29/13 at [facility], Echo further revealed severe concentric lt ventricular hypertrophy, EF 50-55%, rt ventricular septal wall flattened in diastole consistent w/ rt ventricular vol overload.  Repeat echo today shows LVEF 35-40%, global hypokinesis, severe LVH, mild MR and TR.  Past Medical History  Diagnosis Date  . Anemia   . Hyperlipidemia     takes Pravastatin daily  . Hypertension     takes Cardizem,Proscar,and Lisinopril daily  . Peripheral neuropathy     takes Furosemide daily  . Joint pain   . Joint swelling   . GERD  (gastroesophageal reflux disease)     takes Zantac daily   . Constipation     takes stool softener daily  . History of blood transfusion     "related to one of my ORs"  . Impaired hearing     wears hearing aids  . Type II diabetes mellitus     takes Metformin daily  . Rheumatoid arthritis     "all over; take injections q 6 wks" (02/01/2013)  . Atrial fibrillation 12/08/2013     Past Surgical History  Procedure Laterality Date  . Total shoulder replacement Left 03/2010  . Shoulder arthroscopy w/ rotator cuff repair Right 1990's  . Repair tendons foot Left 1946; 1984    "farming accident; repaired tendons both times"  . Cataract extraction w/ intraocular lens  implant, bilateral Bilateral 1990's  . Total knee arthroplasty Right 12/16/2010    Procedure: TOTAL KNEE ARTHROPLASTY; rt Surgeon: Rudean Haskell, MD;  Location: [place];  Service: Orthopedics;  Laterality: Right;  . Ankle fusion Left 2014  . Tonsillectomy  1930's  . Colonoscopy    . Thyroidectomy, partial Right 02/01/2013  . Carpal tunnel release Bilateral 1990's  . Biopsy thyroid  2014  . Thyroidectomy Right 02/01/2013    Procedure: RIGHT THYROIDECTOMY LOBECTOMY;  Surgeon: Earnstine Regal, MD;  Location: [place];  Service: General;  Laterality: Right;  . Foreign body removal Right 09/06/2013    Procedure: REMOVAL FOREIGN  BODY RIGHT INDEX FINGER;  Surgeon: Daryll Brod, MD;  Location: [place];  Service: Orthopedics;  Laterality: Right;  ANESTHESIA: IV REGIONAL FAB     Family History  Problem Relation Age of Onset  . Cancer Mother     breast     Social History: History   Social History  . Marital Status: Married    Spouse Name: N/A    Number of Children: N/A  . Years of Education: N/A   Occupational History  . Not on file.   Social History Main Topics  . Smoking status: Former Smoker -- 1.00 packs/day for 15 years    Types: Cigarettes    Quit date: 09/01/1965  . Smokeless tobacco: Never Used      Comment: quit smoking in 1967  . 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 Drug Use: No  . Sexual Activity: No   Other Topics Concern  . Not on file   Social History Narrative     Prescriptions prior to admission  Medication Sig Dispense Refill Last Dose  . diltiazem (CARDIZEM CD) 360 MG 24 hr capsule Take 360 mg by mouth daily.    11/30/2013  . Docusate Sodium (DSS) 100 MG CAPS Take 100 mg by mouth 2 (two) times daily.   11/30/2013  . finasteride (PROSCAR) 5 MG tablet Take 5 mg by mouth daily.   16/10/9602  . folic acid (FOLVITE) 1 MG tablet Take 1 mg by mouth daily.     11/30/2013  . furosemide (LASIX) 40 MG tablet Take 40 mg by mouth daily.   11/30/2013  . HYDROcodone-acetaminophen (NORCO) 5-325 MG per tablet Take 1 tablet by mouth every 6 (six) hours as needed for moderate pain. 30 tablet 0 2-3 weeks  . ipratropium-albuterol (DUONEB) 0.5-2.5 (3) MG/3ML SOLN Take 3 mLs by nebulization every 6 (six) hours as needed (shortness of breath).     [name] lisinopril (PRINIVIL,ZESTRIL) 20 MG tablet Take 20 mg by mouth daily.     12/06/2013 at Unknown time  . metFORMIN (GLUCOPHAGE) 1000 MG tablet Take 1,000 mg by mouth 2 (two) times daily with a meal.     12/06/2013 at Unknown time  . Methotrexate, PF, 25 MG/0.4ML SOAJ Inject 25 mg into the skin once a week. On Sunday   12/04/2013  . montelukast (SINGULAIR) 10 MG tablet Take 10 mg by mouth at bedtime.     . pravastatin (PRAVACHOL) 40 MG tablet Take 40 mg by mouth daily.   12/06/2013 at Unknown time  . ranitidine (ZANTAC) 150 MG tablet Take 150 mg by mouth 2 (two) times daily.     12/06/2013 at Unknown time  . senna (SENOKOT) 8.6 MG TABS Take 1 tablet by mouth 2 (two) times daily.   12/06/2013 at Unknown time  . Tamsulosin HCl (FLOMAX) 0.4 MG CAPS Take 0.4 mg by mouth daily after supper.    12/06/2013 at Unknown time  . temazepam (RESTORIL) 15 MG capsule Take 15 mg by mouth at bedtime.      12/06/2013 at Unknown time  . terazosin (HYTRIN) 5 MG capsule Take 5 mg by mouth at bedtime.     11 /24/2015 at Unknown time  . traMADol (ULTRAM) 50 MG tablet Take by mouth every 6 (six) hours as needed.   2-3 weeks  . gabapentin (NEURONTIN) 300 MG capsule Take 1 capsule (300 mg total) by mouth 3 (three) times daily. (Patient not  taking: Reported on 12/07/2013) 15 capsule 0 09/05/2013 at Unknown time  . InFLIXimab (REMICADE IV) Inject into the vein every 6 (six) weeks. Dr Ouida Sills.   Not Taking at Unknown time    Scheduled Meds: . aspirin EC  81 mg Oral Daily  . diltiazem  90 mg Oral 4 times per day  . famotidine  20 mg Oral Daily  . finasteride  5 mg Oral Daily  . gabapentin  300 mg Oral TID  . guaiFENesin  200 mg Oral 6 times per day  . heparin  5,000 Units Subcutaneous 3 times per day  . insulin aspart  0-15 Units Subcutaneous TID WC  . montelukast  10 mg Oral QHS  . potassium chloride  20 mEq Oral BID  . pravastatin  40 mg Oral Daily  . senna  1 tablet Oral BID  . tamsulosin  0.4 mg Oral QPC supper   Continuous Infusions:  PRN Meds:.HYDROcodone-acetaminophen, ipratropium-albuterol, temazepam  ROS: General: no fevers/chills/night sweats Eyes: no blurry vision, diplopia, or amaurosis ENT: no sore throat or hearing loss Resp: chronic cough and wheezing, no hemoptysis. Increased SOB. CV: no edema or palpitations or chest pain GI: no abdominal pain, nausea, vomiting, diarrhea, or constipation GU: no dysuria, frequency, or hematuria Skin: no rash Neuro: no headache, numbness, tingling, or weakness of extremities Musculoskeletal: no joint pain or swelling Heme: no bleeding, DVT, or easy bruising Endo: no polydipsia or polyuria    Physical Exam: Blood pressure 106/60, pulse 80, temperature 98.3 F (36.8 C), temperature source Oral, resp. rate 18, height 5\' 8"  (1.727 m), weight 91 kg (200 lb 9.9 oz), SpO2 100 %.   BP 106/60 mmHg  Pulse 80  Temp(Src) 98.3 F (36.8 C) (Oral)   Resp 18  Ht 5\' 8"  (1.727 m)  Wt 91 kg (200 lb 9.9 oz)  BMI 30.51 kg/m2  SpO2 100% General appearance: alert, cooperative, no distress and appears younger than stated age Throat: lips, mucosa, and tongue normal; teeth and gums normal Neck: no adenopathy, no carotid bruit, no JVD, supple, symmetrical, trachea midline and thyroid not enlarged, symmetric, no tenderness/mass/nodules Lungs: coarse lung sounds throughout Chest wall: no tenderness Heart: irregularly irregular rhythm, S1: variable and S2: normal, no gallops, no murmurs Extremities: chronic edema in left lower ankle and foot, trace edema in right LE Pulses: radial pulses 2+, popliteal pulses 2+, pedal pulses feeble bilaterally  Labs:   Lab Results  Component Value Date   WBC 9.6 12/10/2013   HGB 11.7* 12/10/2013   HCT 34.1* 12/10/2013   MCV 87.2 12/10/2013   PLT 139* 12/10/2013    Recent Labs Lab 12/07/13 1403  12/10/13 0243  NA 127*  < > 134*  K 3.3*  < > 3.7  CL 85*  < > 94*  CO2 22  < > 26  BUN 55*  < > 46*  CREATININE 2.09*  < > 2.05*  CALCIUM 8.7  < > 8.5  PROT 6.2  --   --   BILITOT 0.6  --   --   ALKPHOS 63  --   --   ALT 19  --   --   AST 14  --   --   GLUCOSE 356*  < > 210*  < > = values in this interval not displayed. Lab Results  Component Value Date   TROPONINI <0.30 12/07/2013    Lipid Panel     Component Value Date/Time   CHOL 142 12/08/2013 0348   TRIG 144  12/08/2013 0348   HDL 50 12/08/2013 0348   CHOLHDL 2.8 12/08/2013 0348   VLDL 29 12/08/2013 0348   LDLCALC 63 12/08/2013 0348   BNP (last 3 results)  Recent Labs  12/07/13 1405  PROBNP 4485.0*    EKG: atrial fibrillation, rate 70s-80s.  CT angiogram chest 12/07/2013:  Small area of infiltrate in the lateral segment of the left lower lobe. There is associated subsegmental atelectasis in this area. Mild bibasilar lung scarring. There is relatively mild lower lobe bronchiectasis bilaterally. No demonstrable adenopathy. Multiple  foci of coronary artery calcification. Spinal stenosis at L1-2 and L2-3, multifactorial.  ASSESSMENT AND PLAN:  1. Atrial fibrillation with moderate RVR: symptomatic, CHA2DS2-VASCScore: Risk Score  4,  Yearly risk of stroke  4.0.  Talpa Has Bled: Score 2. Estimated bleeding risk: 1.88-3.2%/year. Start Xarelto 15mg  due to decreased CrCl.   2. Acute systolic and diastolic Heart Failure: LVEF 35-40% on repeat echocardiogram.  Change cardizem to carvedilol.   Start Bidil 20-37.5mg  tid. Echocardiogram performed 2 weeks ago [facility] had revealed ejection fraction of 50-55%. Not sure if this was not an error. 3. Diabetes mellitus type 2 4. Hypertension 5. Hyperlipidemia 6. Diabetes mellitus with chronic kidney disease, stage III. Creatinine clearance between 29-36 mL per minute. 7. Coronary calcification by CT angiogram of the chest. 8. Bilateral basilar bronchiectasis  Recommendation: Patient will be started on long-term anticoagulation, patient also started on beta blocker, no active wheezing present, but will try carvedilol for his CHF and cardiomyopathy. Also started him on BiDil one by mouth 3 times a day. No ACE inhibitors or ARB at this point due to chronic renal failure, recent contrast exposure by CT angiogram of the chest.  Patient's presentation appears to be chronic, shortness of breath is related to both congestive heart failure, atrial fibrillation and also underlying bronchiectasis. Patient in need of ablation 2 weeks ago while he was in [facility], but this was not addressed at that point.  I will also start him on furosemide 40 mg IV twice a day, repeat BNP in the morning and baseline today, BMP in the morning. I'll consider direct current cardioversion after 2-3 weeks of anticoagulation therapy. He will also need cardiac risk stratification with regard to CAD, this can be performed in the outpatient basis.  Thank for the consultation, I will continue to follow the  patient.  Rachel Bo, NP-C 12/10/2013, 10:27 AM [facility] [place] Pager: [phone] Office: [phone] If no answer Cell [phone] I have personally reviewed, examined the patient, and updated the consult note performed by Ms. Ebony Hail, [facility].

## 2013-12-10 NOTE — Progress Notes (Signed)
Patient ID: [name], male   DOB: [birth year], 80 y.o.   MRN: [id number] Medicine attending: I examined the patient together with resident physician Dr. Denton Brick and I concur with her evaluation and management plan. Pulmonary function tests including DLCO surprisingly good for his age and his history of presumed chronic lung disease. Echocardiogram shows a depressed left ventricular ejection fraction of 35-40 percent with increased ventricular wall thickness consistent with severe left ventricular hypertrophy. There is diffuse hypokinesis. He remains in atrial fibrillation. At this point we feel that his symptoms of persistent dyspnea are cardiac and not pulmonary related both secondary to a new atrial arrhythmia and a decreased ejection fraction with what appears to be hypertensive cardiomyopathy. We have asked cardiology to consult.

## 2013-12-10 NOTE — Progress Notes (Signed)
Subjective: Pt about the same today. Family present.    Objective: Vital signs in last 24 hours: Filed Vitals:   12/09/13 0538 12/09/13 1356 12/09/13 2035 12/10/13 0548  BP: 152/71 100/65 102/53 106/60  Pulse: 52 58 82 80  Temp: 97.9 F (36.6 C) 98.4 F (36.9 C) 98.6 F (37 C) 98.3 F (36.8 C)  TempSrc: Oral Oral Oral Oral  Resp: 18 20 18 18   Height:      Weight: 200 lb 11.2 oz (91.037 kg)   200 lb 9.9 oz (91 kg)  SpO2: 98% 96% 99% 100%   Weight change: -1.3 oz (-0.037 kg)  Intake/Output Summary (Last 24 hours) at 12/10/13 1305 Last data filed at 12/10/13 0905  Gross per 24 hour  Intake    720 ml  Output   1600 ml  Net   -880 ml   Physical Exam-  General: Sitting on bed, pleasant, awake and alert. HEENT: moist mucous membranes Lungs: No added sounds Cardiac: irregular rate and irregular rhythm, no murmurs GI: active bowel sounds, soft, nontender Ext: negative pedal edema Neuro: No obvious cranial nerve abnormalities.  Lab Results: Basic Metabolic Panel:  Recent Labs Lab 12/09/13 0951 12/10/13 0243  NA 138 134*  K 3.4* 3.7  CL 98 94*  CO2 27 26  GLUCOSE 150* 210*  BUN 48* 46*  CREATININE 2.24* 2.05*  CALCIUM 8.8 8.5   Liver Function Tests:  Recent Labs Lab 12/07/13 1403  AST 14  ALT 19  ALKPHOS 63  BILITOT 0.6  PROT 6.2  ALBUMIN 3.3*    CBC:  Recent Labs Lab 12/07/13 1403  12/09/13 0951 12/10/13 0243  WBC 18.4*  < > 10.6* 9.6  NEUTROABS 16.3*  --   --  5.9  HGB 13.4  < > 12.7* 11.7*  HCT 37.2*  < > 36.4* 34.1*  MCV 84.2  < > 86.5 87.2  PLT 152  < > 141* 139*  < > = values in this interval not displayed. Cardiac Enzymes:  Recent Labs Lab 12/07/13 1403 12/07/13 2042  TROPONINI <0.30 <0.30   BNP:  Recent Labs Lab 12/07/13 1405 12/10/13 0243  PROBNP 4485.0* 2284.0*   CBG:  Recent Labs Lab 12/08/13 2115 12/09/13 0559 12/09/13 1328 12/09/13 1607 12/10/13 0622 12/10/13 1126  GLUCAP 168* 314* 262* 191* 229* 420*    Fasting Lipid Panel:  Recent Labs Lab 12/08/13 0348  CHOL 142  HDL 50  LDLCALC 63  TRIG 144  CHOLHDL 2.8   Urinalysis:  Recent Labs Lab 12/07/13 1415  COLORURINE YELLOW  LABSPEC 1.009  PHURINE 6.0  GLUCOSEU 250*  HGBUR NEGATIVE  BILIRUBINUR NEGATIVE  KETONESUR NEGATIVE  PROTEINUR NEGATIVE  UROBILINOGEN 0.2  NITRITE NEGATIVE  LEUKOCYTESUR NEGATIVE   Studies/Results: No results found. Medications: I have reviewed the patient's current medications. Scheduled Meds: . carvedilol  6.25 mg Oral BID WC  . famotidine  20 mg Oral Daily  . finasteride  5 mg Oral Daily  . furosemide  40 mg Intravenous BID  . gabapentin  300 mg Oral TID  . guaiFENesin  200 mg Oral 6 times per day  . insulin aspart  0-20 Units Subcutaneous TID WC  . insulin glargine  10 Units Subcutaneous Daily  . isosorbide-hydrALAZINE  1 tablet Oral TID  . montelukast  10 mg Oral QHS  . potassium chloride  20 mEq Oral BID  . pravastatin  40 mg Oral Daily  . rivaroxaban  15 mg Oral Q supper  . senna  1 tablet Oral BID  . tamsulosin  0.4 mg Oral QPC supper   Continuous Infusions:  PRN Meds:.HYDROcodone-acetaminophen, ipratropium-albuterol, temazepam Assessment/Plan:  Paroxysmal Afib w/ RVR- CHADS-VASc score 5, prior EKG on 01/2013 and 12/2010 revealed normal sinus rhythm, however pt was told in the past he has an irregular heart beat. Paroxysmal vs new onset A fib unclear at this time. Echo revealed EF reduced- 35-35%, with diffuse hypokinesis. Unsure if this is related to Atria fib. ECHO done at [facility] 2 weeks ago- EF- 50-55%. - aspirin 81 - Cards Consulted, recs appreciated. Switched from Cardizem to [employer] - Started on [place]- 15mg  daily - Pts PCP Dr. Bettina Gavia with [facility] in [place], [place]. Office number [phone], can get records on Monday if still needed.  Diastolic heart failure . Severe LVH on ECHO- 12/09/2013- EF- 35-40%. With Global hypokinesis. Different from Last ECHo ~2  weeks ago. - Cards Consulted, recs appreciated.  - Pt started on IV LAsix 40mg  BID, BIDIL 20-37.5mg  TID, Xalrelto 15mg  daily , coreg 6.25mg  daily - Daily weights, strict in an and out.  SOB- Likely due to AtriaSmall Lt lower lobe infiltrate with bronchiectasis on CT- Likely contributing to pts SOB with Normal PFTs. Leukocytosis (WBC 18.4) trended down to  9.6 today. Pt was on steroids from last admission at [facility]. SOB likely due to diastoic heart failure and Atria fib.  AKI on CKD4-- BUN 55, Cr 2.09, GFR 28, alb 3.3. Na 127 and K+ 3.3 State he was referred to [facility] but never required dialysis. Cr stable at 2.05 today, Cr at [place]- ~1.6 - Records from [facility] reveal Cr of 1.6 while admitted, pt got a renal u/s due to elevated Cr which revealed lt>rt renal cortical thinning and increased renal parenchymal echogenicity b/l consistent w/ medical renal disease, simple cyst b/l, prostatic enlargement.  - will have pt f/u with nephrology outpatient  - KDur 81mEq BID for K+ on 3.4  DM- called pharmacy and pt recently got metformin/glipitin 1000-2.5mg  filled. Blood sugars- 420 this morning. - Hold home med, pending HgBA1c home meds might need to be switched.  - No Metformin on discharge as pt had CKD4. - HgbA1c - Start Lantus 10u daily with SSI-R.  RA- home med listed as infliximab q6 weeks by Dr. Ouida Sills. Per pt he was recently taken off infliximab and started on orencia q30 days- will confirm once get medical records. PFTs no evidence of interstial lung disease. - Cont home norco 5-325 q6h prn for pain  Urinary hesitancy- denies hx of BPH - continue home meds finasteride 5mg  and flomax 0.4mg   Diet: carb mod  DVT prophylaxis: heparin   Dispo: Disposition is deferred at this time, awaiting improvement of current medical problems.   The patient does have a current PCP [name], MD) and does not need an [facility] hospital follow-up appointment after discharge.  The  patient does not have transportation limitations that hinder transportation to clinic appointments. .Services Needed at time of discharge: Y = Yes, Blank = No PT:   OT:   RN:   Equipment:   Other:     LOS: 3 days   Bethena Roys, MD 12/10/2013, 1:05 PM

## 2013-12-10 NOTE — Progress Notes (Signed)
Cardizem drip begun at 10 mg at 1540. Hr in110's a-fib.

## 2013-12-10 NOTE — Progress Notes (Signed)
Dr Einar Gip aware of above. Orders recieved

## 2013-12-10 NOTE — Progress Notes (Addendum)
Hr noted to be in the 140-150's a-fib sustained. Bp 121/67, pt voices no complaints.

## 2013-12-11 LAB — BASIC METABOLIC PANEL
Anion gap: 13 (ref 5–15)
BUN: 50 mg/dL — AB (ref 6–23)
CALCIUM: 8.5 mg/dL (ref 8.4–10.5)
CHLORIDE: 95 meq/L — AB (ref 96–112)
CO2: 27 mEq/L (ref 19–32)
Creatinine, Ser: 2.42 mg/dL — ABNORMAL HIGH (ref 0.50–1.35)
GFR calc Af Amer: 27 mL/min — ABNORMAL LOW (ref >90)
GFR calc non Af Amer: 24 mL/min — ABNORMAL LOW (ref >90)
GLUCOSE: 263 mg/dL — AB (ref 70–99)
Potassium: 4.4 mEq/L (ref 3.7–5.3)
Sodium: 135 mEq/L — ABNORMAL LOW (ref 137–147)

## 2013-12-11 LAB — GLUCOSE, CAPILLARY
GLUCOSE-CAPILLARY: 130 mg/dL — AB (ref 70–99)
GLUCOSE-CAPILLARY: 255 mg/dL — AB (ref 70–99)
Glucose-Capillary: 205 mg/dL — ABNORMAL HIGH (ref 70–99)
Glucose-Capillary: 271 mg/dL — ABNORMAL HIGH (ref 70–99)

## 2013-12-11 LAB — PROTIME-INR
INR: 1.09 (ref 0.00–1.49)
PROTHROMBIN TIME: 14.2 s (ref 11.6–15.2)

## 2013-12-11 LAB — CBC
HCT: 31.9 % — ABNORMAL LOW (ref 39.0–52.0)
HEMOGLOBIN: 10.9 g/dL — AB (ref 13.0–17.0)
MCH: 30 pg (ref 26.0–34.0)
MCHC: 34.2 g/dL (ref 30.0–36.0)
MCV: 87.9 fL (ref 78.0–100.0)
Platelets: 143 10*3/uL — ABNORMAL LOW (ref 150–400)
RBC: 3.63 MIL/uL — ABNORMAL LOW (ref 4.22–5.81)
RDW: 13 % (ref 11.5–15.5)
WBC: 9.4 10*3/uL (ref 4.0–10.5)

## 2013-12-11 LAB — PRO B NATRIURETIC PEPTIDE: Pro B Natriuretic peptide (BNP): 2480 pg/mL — ABNORMAL HIGH (ref 0–450)

## 2013-12-11 MED ORDER — APIXABAN 2.5 MG PO TABS
2.5000 mg | ORAL_TABLET | Freq: Two times a day (BID) | ORAL | Status: DC
  Administered 2013-12-11: 2.5 mg via ORAL
  Filled 2013-12-11 (×2): qty 1

## 2013-12-11 MED ORDER — WARFARIN SODIUM 7.5 MG PO TABS
7.5000 mg | ORAL_TABLET | Freq: Once | ORAL | Status: AC
  Administered 2013-12-11: 7.5 mg via ORAL
  Filled 2013-12-11: qty 1

## 2013-12-11 MED ORDER — WARFARIN - PHARMACIST DOSING INPATIENT
Freq: Every day | Status: DC
  Administered 2013-12-11: 22:00:00

## 2013-12-11 NOTE — Progress Notes (Signed)
Patient ID: [name], male   DOB: 02-22-1930, 80 y.o.   MRN: [id number] Medicine attending: Cardiology consultation greatly appreciated. We will follow recommendations except as noted below. Cardiology agrees, current symptoms likely reflect a combination of uncontrolled atrial fibrillation and acute systolic and diastolic heart failure. Anticoagulation recommended. Initial Cardizem infusion. Now changed to oral Coreg 6.25 mg twice a day. Current status reviewed with resident physician Dr. Denton Brick and I concur with her evaluation and management plan. We discussed the optimal anticoagulant for this man. His estimated GFR is borderline at 30 mL/m. He was started on amiodarone to control his atrial fibrillation. This is one of the few drugs that increases serum levels of the new oral anticoagulants.  . For both of these reasons,  warfarin would be a safer drug for him.

## 2013-12-11 NOTE — Progress Notes (Addendum)
Subjective: Pt feeling better. Denies SOB, chest pain, and chest palpitations. When asked if pt takes DM medication he said no, when informed he just filled a prescription for jentodueta he then remember that he does take DM medications.   Per nurse report, pt's HR went up to 140-150's and cardiology was called who started pt on a cardizem 10cc/hr gtt. Nurse recently decreased gtt to 5cc/hr for HR in the 75's   Objective: Vital signs in last 24 hours: Filed Vitals:   12/10/13 1355 12/10/13 1700 12/10/13 2051 12/11/13 0526  BP: 99/59 105/59 97/50 104/55  Pulse: 117  48 62  Temp: 97.7 F (36.5 C)  97.8 F (36.6 C) 97.6 F (36.4 C)  TempSrc: Oral  Oral Oral  Resp: 20  18 16   Height:      Weight:    204 lb 3.2 oz (92.625 kg)  SpO2: 97%  97% 99%   Weight change: 3 lb 9.3 oz (1.624 kg)  Intake/Output Summary (Last 24 hours) at 12/11/13 0740 Last data filed at 12/11/13 0316  Gross per 24 hour  Intake    720 ml  Output   3340 ml  Net  -2620 ml   General: NAD, hard of hearing, pleasant HEENT: moist mucous membranes Lungs: CTAB Cardiac: irregular rate and rhythm, no murmurs GI: active bowel sounds, soft, nontender Ext: negative pedal edema Neuro: CN 2-12 grossly intact   Lab Results: Basic Metabolic Panel:  Recent Labs Lab 12/10/13 0243 12/11/13 0320  NA 134* 135*  K 3.7 4.4  CL 94* 95*  CO2 26 27  GLUCOSE 210* 263*  BUN 46* 50*  CREATININE 2.05* 2.42*  CALCIUM 8.5 8.5   Liver Function Tests:  Recent Labs Lab 12/07/13 1403  AST 14  ALT 19  ALKPHOS 63  BILITOT 0.6  PROT 6.2  ALBUMIN 3.3*    CBC:  Recent Labs Lab 12/07/13 1403  12/10/13 0243 12/11/13 0320  WBC 18.4*  < > 9.6 9.4  NEUTROABS 16.3*  --  5.9  --   HGB 13.4  < > 11.7* 10.9*  HCT 37.2*  < > 34.1* 31.9*  MCV 84.2  < > 87.2 87.9  PLT 152  < > 139* 143*  < > = values in this interval not displayed. Cardiac Enzymes:  Recent Labs Lab 12/07/13 1403 12/07/13 2042  TROPONINI <0.30  <0.30   BNP:  Recent Labs Lab 12/07/13 1405 12/10/13 0243 12/11/13 0320  PROBNP 4485.0* 2284.0* 2480.0*   CBG:  Recent Labs Lab 12/09/13 1607 12/10/13 0622 12/10/13 1126 12/10/13 1609 12/10/13 2055 12/11/13 0650  GLUCAP 191* 229* 420* 129* 189* 205*   Fasting Lipid Panel:  Recent Labs Lab 12/08/13 0348  CHOL 142  HDL 50  LDLCALC 63  TRIG 144  CHOLHDL 2.8   Urinalysis:  Recent Labs Lab 12/07/13 1415  COLORURINE YELLOW  LABSPEC 1.009  PHURINE 6.0  GLUCOSEU 250*  HGBUR NEGATIVE  BILIRUBINUR NEGATIVE  KETONESUR NEGATIVE  PROTEINUR NEGATIVE  UROBILINOGEN 0.2  NITRITE NEGATIVE  LEUKOCYTESUR NEGATIVE    Medications: I have reviewed the patient's current medications. Scheduled Meds: . amiodarone  400 mg Oral TID  . carvedilol  6.25 mg Oral BID WC  . famotidine  20 mg Oral Daily  . finasteride  5 mg Oral Daily  . furosemide  40 mg Intravenous BID  . gabapentin  300 mg Oral BID  . guaiFENesin  200 mg Oral 6 times per day  . insulin aspart  0-20 Units Subcutaneous  TID WC  . insulin glargine  10 Units Subcutaneous Daily  . isosorbide-hydrALAZINE  1 tablet Oral TID  . montelukast  10 mg Oral QHS  . potassium chloride  20 mEq Oral BID  . pravastatin  40 mg Oral Daily  . rivaroxaban  15 mg Oral Q supper  . senna  1 tablet Oral BID  . tamsulosin  0.4 mg Oral QPC supper   Continuous Infusions: . diltiazem (CARDIZEM) infusion 10 mg/hr (12/11/13 0132)   PRN Meds:.HYDROcodone-acetaminophen, ipratropium-albuterol, temazepam Assessment/Plan: Active Problems:   Diabetes   Essential hypertension   CHF (congestive heart failure)   Shortness of breath   Atrial fibrillation   LVH (left ventricular hypertrophy) due to hypertensive disease   Afib w/ RVR-CHADS-VASc score 5, pt was noted to be in Afib during ECHO on 11/29/13 at [facility], ECHO further revealed severe concentric lt ventricular hypertrophy, EF 50-55%, rt ventricular septal wall flattened in  diastole consistent w/ rt ventricular vol overload. Was noted to be on cardizem 360mg  QD since 08/05/13. ECHO performed 11/27 revealed EF of 35-40%, global hypokinesis, severe LVH, mild MR and TR - xarelto 15mg  daily transitioned to eliquis per pharm - cardizem gtt 5 cc/hr, coreg 6.25mg  BID, bidil 20-37.5mg  TID, amiodarone 400mg  TID - cardioversion in 2-3 weeks.  - will refer to Dr. Bettina Gavia with [facility] in [place], [place]. Office number [phone]  Acute systolic and diastolic CHF-- EF of 45-40% on ECHO 11/27.  BNP on admission to [place] [facility] on 11/13 was 10600, BNP was 4485 here on admission now down to 2480 on 11/29. - given lasix 40mg  IV BID yesterday, pt net intake/output over 24hrs -2.6, net - 5.7 since admission w/ improvement in SOB - will continue to monitor  SOB-- CT chest w/o contrast revealed lower left area of infiltrate not felt to be PNA -  PFTs and 70min walk WNL -  Resolving, likely from afib and acute CHF, will continue to monitor  AKI on CKD4-- BUN 55, Cr 2.09, GFR 28, alb 3.3. Na 127 and K+ 3.3 State he goes to [facility] but never required dialysis. Records from [facility] reveal Cr of 1.6 while admitted, pt got a renal u/s due to elevated Cr which revealed lt>rt renal cortical thinning and increased renal parenchymal echogenicity b/l consistent w/ medical renal disease, simple cyst b/l, prostatic enlargement. - Cr trending up since admission; 2.09-->2.19-->2.24--> 2.05-->2.24 today  - will have pt f/u with nephrology outpatient  - d/c'd KDur 76mEq BID, K+ 4.4  DM- called pharmacy and pt recently got jentadueto 1000-2.5mg  filled - hold home med, SSI sensitive  -will d/c metformin - can consider linagliptin 5mg  PO daily and glimepride 1mg  daily for DM control on discharge  RA- home med listed as infliximab q6 weeks by Dr. Ouida Sills. Per pt he was recently taken off infliximab and started on orencia q30 days - norco 5-325 q6h prn for  pain  Urinary hesitancy- denies hx of BPH - continue home meds finasteride 5mg  and flomax 0.4mg  - stopped home terazosin  Polypharmacy - will try and condense pt's medication, pt has limited understanding of what meds he is on - d/c'd xytrin as he is already on flomax - decreased gabapentin to 300mg  BID - can continue MTX on d/c in combination with orencia - will change metformin combo pill to another agent as metformin in contraindicated in CKD4  Diet: carb mod  DVT prophylaxis: eliquis per pharm  Dispo: Disposition is deferred at this time, awaiting  improvement of current medical problems. Anticipated discharge in approximately 2-3 day(s).   The patient does have a current PCP [name], MD) and does not need an [facility] hospital follow-up appointment after discharge.  The patient does not have transportation limitations that hinder transportation to clinic appointments. .Services Needed at time of discharge: Y = Yes, Blank = No PT:  Home health PT;Supervision for mobility/OOB  OT:   RN:   Equipment:   Other:     LOS: 4 days   Julious Oka, MD 12/11/2013, 7:40 AM

## 2013-12-11 NOTE — Progress Notes (Signed)
Subjective:  Feels much improved, shortness of breath also improved. Patient ambulated in the hallway without any problems. Patient had atrial fibrillation with rapid ventricle response yesterday leading to starting IV Cardizem and also started him on amiodarone 400 mg 2 3 times a day.  Objective:  Vital Signs in the last 24 hours: Temp:  [97.6 F (36.4 C)-97.8 F (36.6 C)] 97.6 F (36.4 C) (11/29 0526) Pulse Rate:  [48-117] 62 (11/29 0526) Resp:  [16-20] 16 (11/29 0526) BP: (97-105)/(50-59) 104/55 mmHg (11/29 0526) SpO2:  [97 %-99 %] 99 % (11/29 0526) Weight:  [92.625 kg (204 lb 3.2 oz)] 92.625 kg (204 lb 3.2 oz) (11/29 0526)  Intake/Output from previous day: 11/28 0701 - 11/29 0700 In: 720 [P.O.:720] Out: 3340 [Urine:3240; Stool:100]  Physical Exam: General appearance: alert, cooperative, no distress and appears younger than stated age Throat: lips, mucosa, and tongue normal; teeth and gums normal Neck: no adenopathy, no carotid bruit, no JVD, supple, symmetrical, trachea midline and thyroid not enlarged, symmetric, no tenderness/mass/nodules Lungs: coarse lung sounds throughout Chest wall: no tenderness Heart: irregularly irregular rhythm, S1: variable and S2: normal, no gallops, no murmurs Extremities: chronic edema in left lower ankle and foot, 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 There is trace edema in right LE Pulses: radial pulses 2+, femoral pulses 2+, popliteal pulses 2+, pedal pulses feeble bilaterally. No carotid bruit.  Lab Results: BMP  Recent Labs  12/09/13 0951 12/10/13 0243 12/11/13 0320  NA 138 134* 135*  K 3.4* 3.7 4.4  CL 98 94* 95*  CO2 27 26 27   GLUCOSE 150* 210* 263*  BUN 48* 46* 50*  CREATININE 2.24* 2.05* 2.42*  CALCIUM 8.8 8.5 8.5  GFRNONAA 26* 29* 24*  GFRAA 30* 33* 27*    CBC  Recent Labs Lab 12/10/13 0243 12/11/13 0320  WBC 9.6 9.4  RBC 3.91* 3.63*  HGB 11.7* 10.9*  HCT 34.1* 31.9*  PLT 139* 143*  MCV 87.2 87.9  MCH 29.9  30.0  MCHC 34.3 34.2  RDW 12.9 13.0  LYMPHSABS 2.5  --   MONOABS 0.8  --   EOSABS 0.3  --   BASOSABS 0.0  --     HEMOGLOBIN A1C Lab Results  Component Value Date   HGBA1C 8.8* 12/10/2013   MPG 206* 12/10/2013    Cardiac Panel (last 3 results)  Recent Labs  12/07/13 1403 12/07/13 2042  TROPONINI <0.30 <0.30    BNP (last 3 results)  Recent Labs  12/07/13 1405 12/10/13 0243 12/11/13 0320  PROBNP 4485.0* 2284.0* 2480.0*    TSH  Recent Labs  03/11/13 1313 06/13/13 1330  TSH 2.934 1.42    CHOLESTEROL  Recent Labs  12/08/13 0348  CHOL 142    Hepatic Function Panel  Recent Labs  12/07/13 1403  PROT 6.2  ALBUMIN 3.3*  AST 14  ALT 19  ALKPHOS 63  BILITOT 0.6   BNP (last 3 results)  Recent Labs  12/07/13 1405 12/10/13 0243 12/11/13 0320  PROBNP 4485.0* 2284.0* 2480.0*     Cardiac Studies: Telemetry: Atrial fibrillation with controlled ventricular response. Echocardiogram 12/09/2013: Left ventricle: The cavity size was normal. Wall thickness was increased in a pattern of severe LVH. Systolic function was moderately reduced. The estimated ejection fraction was in the range of 35% to 40%. Diffuse hypokinesis. The study is not technically sufficient to allow evaluation of LV diastolic function. - Mitral valve: Mildly thickened leaflets . There was mildregurgitation. - Left atrium: The atrium was at the upper  limits of normal insize. - Right atrium: Moderately dilated. - Tricuspid valve: There was mild regurgitation. - Pulmonary arteries: PA peak pressure: 30 mm Hg (S).  CT angiogram chest 12/07/2013:  Small area of infiltrate in the lateral segment of the left lower lobe. There is associated subsegmental atelectasis in this area. Mild bibasilar lung scarring. There is relatively mild lower lobe bronchiectasis bilaterally. No demonstrable adenopathy. Multiple foci of coronary artery calcification. Spinal stenosis at L1-2 and L2-3,  multifactorial.  Assessment/Plan:  1. Atrial fibrillation with moderate RVR: symptomatic, CHA2DS2-VASCScore: Risk Score 4, Yearly risk of stroke 4.0. [place] Has Bled: Score 2. Estimated bleeding risk: 1.88-3.2%/year. Start Xarelto 15mg  due to decreased CrCl.   2. Acute systolic and diastolic Heart Failure: LVEF 35-40% on repeat echocardiogram. Change cardizem to carvedilol.  Start Bidil 20-37.5mg  tid. Echocardiogram performed 2 weeks ago [facility] had revealed ejection fraction of 50-55%. Not sure if this was not an error. 3. Diabetes mellitus type 2 4. Hypertension 5. Hyperlipidemia 6. Diabetes mellitus with chronic kidney disease, stage III. Creatinine clearance between 29-36 mL per minute. 7. Coronary calcification by CT angiogram of the chest. 8. Bilateral basilar bronchiectasis  Recommendation: I have discontinued artisan drip, patient will be continued on amiodarone 400 mg by mouth 3 times a day. I'll watch him for another day today and probably discharge him home in the morning from cardiac standpoint. He will benefit from outpatient cardiac rehabilitation. He will also need outpatient stress testing. Patient otherwise feels well and his dyspnea has improved. Continue IV diuresis for today. Labs are stable. Although CT scan shows coronary calcification,   Laverda Page, M.D. 12/11/2013, 10:53 AM [facility] Pager: [phone] Office: [phone] If no answer: [phone]

## 2013-12-11 NOTE — Progress Notes (Signed)
Pt off Cardizem drip since this am with HR maintained in the 80=-90's a fib

## 2013-12-11 NOTE — Progress Notes (Addendum)
ANTICOAGULATION CONSULT NOTE - Initial Consult  Pharmacy Consult for apixaban Indication: atrial fibrillation  No Known Allergies  Patient Measurements: Height: 5\' 8"  (172.7 cm) Weight: 204 lb 3.2 oz (92.625 kg) IBW/kg (Calculated) : 68.4  Vital Signs: Temp: 97.6 F (36.4 C) (11/29 0526) Temp Source: Oral (11/29 0526) BP: 104/55 mmHg (11/29 0526) Pulse Rate: 62 (11/29 0526)  Labs:  Recent Labs  12/09/13 0951 12/10/13 0243 12/11/13 0320  HGB 12.7* 11.7* 10.9*  HCT 36.4* 34.1* 31.9*  PLT 141* 139* 143*  CREATININE 2.24* 2.05* 2.42*    Estimated Creatinine Clearance: 26.4 mL/min (by C-G formula based on Cr of 2.42).   Medical History: Past Medical History  Diagnosis Date  . Anemia   . Hyperlipidemia     takes Pravastatin daily  . Hypertension     takes Cardizem,Proscar,and Lisinopril daily  . Peripheral neuropathy     takes Furosemide daily  . Joint pain   . Joint swelling   . GERD (gastroesophageal reflux disease)     takes Zantac daily   . Constipation     takes stool softener daily  . History of blood transfusion     "related to one of my ORs"  . Impaired hearing     wears hearing aids  . Type II diabetes mellitus     takes Metformin daily  . Rheumatoid arthritis     "all over; take injections q 6 wks" (02/01/2013)  . Atrial fibrillation 12/08/2013    Medications:  Scheduled:  . amiodarone  400 mg Oral TID  . apixaban  2.5 mg Oral BID  . carvedilol  6.25 mg Oral BID WC  . famotidine  20 mg Oral Daily  . finasteride  5 mg Oral Daily  . furosemide  40 mg Intravenous BID  . gabapentin  300 mg Oral BID  . guaiFENesin  200 mg Oral 6 times per day  . insulin aspart  0-20 Units Subcutaneous TID WC  . insulin glargine  10 Units Subcutaneous Daily  . isosorbide-hydrALAZINE  1 tablet Oral TID  . montelukast  10 mg Oral QHS  . pravastatin  40 mg Oral Daily  . senna  1 tablet Oral BID  . tamsulosin  0.4 mg Oral QPC supper   Infusions:  . diltiazem  (CARDIZEM) infusion 10 mg/hr (12/11/13 0132)    Assessment: [name] is a 74 yom with paroxysmal atrial fibrillation with RVR. He was not anticoagulation PTA.  CHADS-VASc score 5.  Patient received one 15 mg dose of xarelto.  Pharmacy has been consulted to start apixaban.  Patient has a/c CKD IV, Scr 2.42, ~Crcl 26.4 mL/min.  Hemoglobin trending down to 10.9 (13.4 admit), and platelets are wnl.  No overt s/sx of bleeding at this time.  Patient meets 2/3 criteria for dose reduction (> 80 years, Scr > 1.5).  In addition, diltiazem and amiodarone may increase serum levels of apixaban, will monitor closely for s/sx of bleeding.    Plan:  - Start apixaban 2.5 mg BID - Monitor CBC and s/sx of bleeding  Hassie Bruce, Pharm. D. Clinical Pharmacy Resident Pager: [phone] Ph: [phone] 12/11/2013 9:37 AM     Addendum: Graciella Freer now requests patient to start warfarin as anticoagulant of choice. Xarelto and Eliquis have been discontinued. Amiodarone also has a significant drug interaction with warfarin - will need to watch INR closely. Baseline INR is 1.09.  Plan: Warfarin 7.5 mg PO tonight INR daily  [facility], Pharm.D., BCPS Clinical Pharmacist Pager: [phone] 12/11/2013 8:37  PM

## 2013-12-12 DIAGNOSIS — R0602 Shortness of breath: Secondary | ICD-10-CM

## 2013-12-12 DIAGNOSIS — R000 Tachycardia, unspecified: Secondary | ICD-10-CM

## 2013-12-12 DIAGNOSIS — N184 Chronic kidney disease, stage 4 (severe): Secondary | ICD-10-CM

## 2013-12-12 DIAGNOSIS — R3911 Hesitancy of micturition: Secondary | ICD-10-CM

## 2013-12-12 DIAGNOSIS — N179 Acute kidney failure, unspecified: Secondary | ICD-10-CM

## 2013-12-12 DIAGNOSIS — E119 Type 2 diabetes mellitus without complications: Secondary | ICD-10-CM

## 2013-12-12 DIAGNOSIS — I4891 Unspecified atrial fibrillation: Secondary | ICD-10-CM

## 2013-12-12 DIAGNOSIS — M069 Rheumatoid arthritis, unspecified: Secondary | ICD-10-CM

## 2013-12-12 DIAGNOSIS — I5041 Acute combined systolic (congestive) and diastolic (congestive) heart failure: Secondary | ICD-10-CM

## 2013-12-12 LAB — CBC
HCT: 31.9 % — ABNORMAL LOW (ref 39.0–52.0)
HEMOGLOBIN: 10.7 g/dL — AB (ref 13.0–17.0)
MCH: 29.4 pg (ref 26.0–34.0)
MCHC: 33.5 g/dL (ref 30.0–36.0)
MCV: 87.6 fL (ref 78.0–100.0)
PLATELETS: 136 10*3/uL — AB (ref 150–400)
RBC: 3.64 MIL/uL — AB (ref 4.22–5.81)
RDW: 13 % (ref 11.5–15.5)
WBC: 8.9 10*3/uL (ref 4.0–10.5)

## 2013-12-12 LAB — BASIC METABOLIC PANEL
ANION GAP: 11 (ref 5–15)
BUN: 48 mg/dL — ABNORMAL HIGH (ref 6–23)
CHLORIDE: 97 meq/L (ref 96–112)
CO2: 29 mEq/L (ref 19–32)
Calcium: 8.5 mg/dL (ref 8.4–10.5)
Creatinine, Ser: 2.33 mg/dL — ABNORMAL HIGH (ref 0.50–1.35)
GFR, EST AFRICAN AMERICAN: 29 mL/min — AB (ref >90)
GFR, EST NON AFRICAN AMERICAN: 25 mL/min — AB (ref >90)
Glucose, Bld: 216 mg/dL — ABNORMAL HIGH (ref 70–99)
Potassium: 3.9 mEq/L (ref 3.7–5.3)
SODIUM: 137 meq/L (ref 137–147)

## 2013-12-12 LAB — PROTIME-INR
INR: 1.06 (ref 0.00–1.49)
Prothrombin Time: 14 seconds (ref 11.6–15.2)

## 2013-12-12 LAB — GLUCOSE, CAPILLARY
Glucose-Capillary: 180 mg/dL — ABNORMAL HIGH (ref 70–99)
Glucose-Capillary: 177 mg/dL — ABNORMAL HIGH (ref 70–99)
Glucose-Capillary: 197 mg/dL — ABNORMAL HIGH (ref 70–99)

## 2013-12-12 LAB — PRO B NATRIURETIC PEPTIDE: Pro B Natriuretic peptide (BNP): 3116 pg/mL — ABNORMAL HIGH (ref 0–450)

## 2013-12-12 MED ORDER — METOPROLOL TARTRATE 25 MG PO TABS
25.0000 mg | ORAL_TABLET | Freq: Three times a day (TID) | ORAL | Status: DC
  Administered 2013-12-12 – 2013-12-13 (×5): 25 mg via ORAL
  Filled 2013-12-12 (×6): qty 1

## 2013-12-12 MED ORDER — FUROSEMIDE 10 MG/ML IJ SOLN
80.0000 mg | Freq: Once | INTRAMUSCULAR | Status: AC
  Administered 2013-12-12: 80 mg via INTRAVENOUS
  Filled 2013-12-12: qty 8

## 2013-12-12 MED ORDER — WARFARIN SODIUM 7.5 MG PO TABS
7.5000 mg | ORAL_TABLET | Freq: Once | ORAL | Status: AC
  Administered 2013-12-12: 7.5 mg via ORAL
  Filled 2013-12-12: qty 1

## 2013-12-12 NOTE — Progress Notes (Signed)
Physical Therapy Discharge Patient Details Name: [name] MRN: [id number] DOB: 10/14/1930 Today's Date: 12/12/2013 Time: 1152-1200 PT Time Calculation (min) (ACUTE ONLY): 8 min  Patient discharged from PT services secondary to goals met and no further PT needs identified.  Please see latest therapy progress note for current level of functioning and progress toward goals.    Progress and discharge plan discussed with patient and/or caregiver: Patient/Caregiver agrees with plan  GP     [facility] 12/12/2013, 12:55 PM

## 2013-12-12 NOTE — Progress Notes (Signed)
Pt seen and examined with Dr. Hulen Luster. Please refer to resident note for details. I agree with findings and plan as outlined in her note.   Afib with RVR: - Cardio recommendations appreciated - c/w coumadin till INR is at goal (2-3) - Xarelto dc'd secondary to borderline GFR - c/w amiodarone and metoprolol (coreg dc'd secondary to bronchospasm) - Outpatient cardio f/u with possible cardioversion in 2-3 weeks - Lasix 80 mg given once this AM secondary to increasing BNP - Pt is improving and able to ambulate without SOB  DM; - BS well controlled. C/w SSI  Pt stable for d/c home once INR is therapeutic. Dr. Jillene Bucks to assume care in AM

## 2013-12-12 NOTE — Progress Notes (Addendum)
UR completed Gelena Klosinski K. Tsuyako Jolley, RN, BSN, [place], CCM  12/12/2013 11:34 AM

## 2013-12-12 NOTE — Progress Notes (Addendum)
Subjective: Pt feeling great today. Denies CP, chest palpitations, and SOB.   Spoke w/ daughter who reports that pt has had increasing difficulty with taking his own meds. Agreeable to home health RN for med assistance. States that pt would like to get his medications from the [place] since it will be free.    Objective: Vital signs in last 24 hours: Filed Vitals:   12/11/13 0526 12/11/13 1408 12/11/13 2121 12/12/13 0548  BP: 104/55 109/90 105/71 100/54  Pulse: 62 49 50 95  Temp: 97.6 F (36.4 C) 97.7 F (36.5 C) 98 F (36.7 C) 97.6 F (36.4 C)  TempSrc: Oral Oral Oral Oral  Resp: 16 18 18 18   Height:      Weight: 204 lb 3.2 oz (92.625 kg)   204 lb 9.6 oz (92.806 kg)  SpO2: 99% 95% 100% 100%   Weight change: 6.4 oz (0.181 kg)  Intake/Output Summary (Last 24 hours) at 12/12/13 1145 Last data filed at 12/12/13 0553  Gross per 24 hour  Intake 908.33 ml  Output   1750 ml  Net -841.67 ml   General: NAD, hard of hearing, pleasant HEENT: moist mucous membranes Lungs: CTAB Cardiac: irregular rate and rhythm, no murmurs GI: active bowel sounds, soft, nontender Ext: negative pedal edema Neuro: CN 2-12 grossly intact   Lab Results: Basic Metabolic Panel:  Recent Labs Lab 12/11/13 0320 12/12/13 0435  NA 135* 137  K 4.4 3.9  CL 95* 97  CO2 27 29  GLUCOSE 263* 216*  BUN 50* 48*  CREATININE 2.42* 2.33*  CALCIUM 8.5 8.5   Liver Function Tests:  Recent Labs Lab 12/07/13 1403  AST 14  ALT 19  ALKPHOS 63  BILITOT 0.6  PROT 6.2  ALBUMIN 3.3*    CBC:  Recent Labs Lab 12/07/13 1403  12/10/13 0243 12/11/13 0320 12/12/13 0435  WBC 18.4*  < > 9.6 9.4 8.9  NEUTROABS 16.3*  --  5.9  --   --   HGB 13.4  < > 11.7* 10.9* 10.7*  HCT 37.2*  < > 34.1* 31.9* 31.9*  MCV 84.2  < > 87.2 87.9 87.6  PLT 152  < > 139* 143* 136*  < > = values in this interval not displayed. Cardiac Enzymes:  Recent Labs Lab 12/07/13 1403 12/07/13 2042  TROPONINI <0.30 <0.30    BNP:  Recent Labs Lab 12/10/13 0243 12/11/13 0320 12/12/13 0435  PROBNP 2284.0* 2480.0* 3116.0*   CBG:  Recent Labs Lab 12/10/13 2055 12/11/13 0650 12/11/13 1109 12/11/13 1638 12/11/13 2127 12/12/13 0557  GLUCAP 189* 205* 271* 130* 255* 180*   Fasting Lipid Panel:  Recent Labs Lab 12/08/13 0348  CHOL 142  HDL 50  LDLCALC 63  TRIG 144  CHOLHDL 2.8    Medications: I have reviewed the patient's current medications. Scheduled Meds: . amiodarone  400 mg Oral TID  . carvedilol  6.25 mg Oral BID WC  . famotidine  20 mg Oral Daily  . finasteride  5 mg Oral Daily  . gabapentin  300 mg Oral BID  . guaiFENesin  200 mg Oral 6 times per day  . insulin aspart  0-20 Units Subcutaneous TID WC  . insulin glargine  10 Units Subcutaneous Daily  . isosorbide-hydrALAZINE  1 tablet Oral TID  . montelukast  10 mg Oral QHS  . pravastatin  40 mg Oral Daily  . senna  1 tablet Oral BID  . tamsulosin  0.4 mg Oral QPC supper  . warfarin  7.5 mg Oral ONCE-1800  . Warfarin - Pharmacist Dosing Inpatient   Does not apply q1800   Continuous Infusions:   PRN Meds:.HYDROcodone-acetaminophen, ipratropium-albuterol, temazepam Assessment/Plan: Active Problems:   Diabetes   Essential hypertension   CHF (congestive heart failure)   Shortness of breath   Atrial fibrillation   LVH (left ventricular hypertrophy) due to hypertensive disease   Afib w/ RVR-CHADS-VASc score 5, pt was noted to be in Afib during ECHO on 11/29/13 at [facility], ECHO further revealed severe concentric lt ventricular hypertrophy, EF 50-55%, rt ventricular septal wall flattened in diastole consistent w/ rt ventricular vol overload. Was noted to be on cardizem 360mg  QD since 08/05/13. ECHO performed 11/27 revealed EF of 35-40%, global hypokinesis, severe LVH, mild MR and TR - xarelto 15mg  (lower dose 2/2 to CKD) daily transitioned to eliquis and subsequently changed to coumadin as amiodarone will increase levels  of oral Xa inhibitors. INR 1.06 today. Will d/c pt home once INR therapeutic. Will call his PCP today to arrange f/u for INR checks and hospital f/u - Cardiology following, appreciate recommendations, conitnue bidil 20-37.5mg  TID and amiodarone 400mg  TID - d/c'd coreg 6.25mg  BID 2/2 bronchospasm and started lopressor 25mg  TID for rate control - cardioversion in 2-3 weeks.  - will refer to Dr. Bettina Gavia with [facility] in [place], [place]. Office number [phone] [facility] consulted  Acute systolic and diastolic CHF-- EF of 30-86% on ECHO 11/27.  BNP on admission to [facility] on 11/13 was 10600, BNP was 4485 -->2480-->3116 today. Net -6.3L since admission.  - cardiology following, IV lasix 80mg  once this morning - will continue to monitor - outpt stress test recommended - cardiac rehab initiated  SOB-- CT chest w/o contrast revealed lower left area of infiltrate not felt to be PNA -  PFTs and 52min walk WNL -  Resolving, likely from afib and acute CHF, will continue to monitor  AKI on CKD4-- BUN 55, Cr 2.09, GFR 28, alb 3.3. Na 127 and K+ 3.3 State he goes to [facility] but never required dialysis. Records from [facility] reveal Cr of 1.6 while admitted, pt got a renal u/s due to elevated Cr which revealed lt>rt renal cortical thinning and increased renal parenchymal echogenicity b/l consistent w/ medical renal disease, simple cyst b/l, prostatic enlargement. - Cr trending up since admission; 2.09-->2.19-->2.24--> 2.05-->2.24 today  - will have pt f/u with nephrology outpatient  - d/c'd KDur 4mEq BID, K+ 4.4  DM- called pharmacy and pt recently got jentadueto 1000-2.5mg  filled - hold home med, SSI sensitive  -will d/c metformin - can consider linagliptin 5mg  PO daily and glimepride 1mg  daily for DM control on discharge  RA- home med listed as infliximab q6 weeks by Dr. Ouida Sills. Per pt he was recently taken off infliximab and started on orencia q30 days - norco 5-325  q6h prn for pain  Urinary hesitancy- denies hx of BPH - continue home meds finasteride 5mg  and flomax 0.4mg  - stopped home terazosin  Polypharmacy- placed face to face order for home health RN/PT today - will try and condense pt's medication, pt has limited understanding of what meds he is on - d/c'd xytrin as he is already on flomax - decreased gabapentin to 300mg  BID - can continue MTX on d/c in combination with orencia - will change metformin combo pill to another agent as metformin in contraindicated in CKD4 - will need to print prescriptions out on d/c for patient to take to the [place] with him  Diet: carb mod  DVT prophylaxis: coumadin per pharm  Dispo: Disposition is deferred at this time, awaiting improvement of current medical problems. Anticipated discharge in approximately 1-2 day(s).   The patient does have a current PCP [name], MD) and does not need an [facility] hospital follow-up appointment after discharge.  The patient does not have transportation limitations that hinder transportation to clinic appointments. .Services Needed at time of discharge: Y = Yes, Blank = No PT:  Home health PT;Supervision for mobility/OOB  OT:   RN:   Equipment:   Other:     LOS: 5 days   Julious Oka, MD 12/12/2013, 11:45 AM

## 2013-12-12 NOTE — Plan of Care (Signed)
Problem: Acute Rehab PT Goals(only PT should resolve) Goal: Pt Will Go Supine/Side To Sit Outcome: Completed/Met Date Met:  12/12/13 Goal: Pt Will Go Sit To Supine/Side Outcome: Completed/Met Date Met:  12/12/13 Goal: Patient Will Transfer Sit To/From Stand Outcome: Completed/Met Date Met:  12/12/13 Goal: Pt Will Ambulate Outcome: Completed/Met Date Met:  12/12/13     

## 2013-12-12 NOTE — Progress Notes (Signed)
CARDIAC REHAB PHASE I   PRE:  Rate/Rhythm: 93 afib  BP:  Supine:   Sitting: 106/49  Standing:    SaO2: 100%RA  MODE:  Ambulation: 460 ft   POST:  Rate/Rhythm: 100 afib  BP:  Supine:   Sitting: 114/71  Standing:    SaO2: 100%RA 1305-1336 Pt walked 460 ft with hand held asst. Tolerated well. No complaints. Gait fairly steady. To chair after walk. Walked with two of [place] holding to his hands but can be x 1 asst.   Graylon Good, RN BSN  12/12/2013 1:31 PM

## 2013-12-12 NOTE — Progress Notes (Signed)
Physical Therapy Treatment Patient Details Name: [name] MRN: [id number] DOB: 1930/02/03 Today's Date: 01/05/2014    History of Present Illness Patient is a 80 y/o male who presents with persistent dyspnea and found to have afib with RVR in ED. CT chest- LLL infiltrate. Recently admitted 11/13 to [facility] w/ a three-week history of cough and a 2 to three-month history of increasing dyspnea. He was treated with bronchodilators, parenteral antibiotics, and steroids. PMH of essential hypertension, type 2 diabetes, chronic renal insufficiency, rheumatoid arthritis on chronic methotrexate therapy and anti-TNF therapy. He has a history of a hemithyroidectomy    PT Comments    Pt doing well. No further acute PT needs.  Follow Up Recommendations  Home health PT     Equipment Recommendations  None recommended by PT    Recommendations for Other Services       Precautions / Restrictions Precautions Precautions: Fall    Mobility  Bed Mobility               General bed mobility comments: Independent per pt.  Transfers Overall transfer level: Modified independent   Transfers: Sit to/from Stand Sit to Stand: Modified independent (Device/Increase time)            Ambulation/Gait Ambulation/Gait assistance: Modified independent (Device/Increase time) Ambulation Distance (Feet): 300 Feet Assistive device: None Gait Pattern/deviations: Step-through pattern;Wide base of support         Stairs            Wheelchair Mobility    Modified Rankin (Stroke Patients Only)       Balance   Sitting-balance support: No upper extremity supported Sitting balance-Leahy Scale: Normal     Standing balance support: No upper extremity supported Standing balance-Leahy Scale: Good                      Cognition Arousal/Alertness: Awake/alert Behavior During Therapy: WFL for tasks assessed/performed Overall Cognitive Status: Within Functional Limits  for tasks assessed                      Exercises      General Comments        Pertinent Vitals/Pain Pain Assessment: No/denies pain    Home Living                      Prior Function            PT Goals (current goals can now be found in the care plan section) Progress towards PT goals: Goals met/education completed, patient discharged from PT    Frequency       PT Plan Current plan remains appropriate    Co-evaluation             End of Session   Activity Tolerance: Patient tolerated treatment well Patient left: in bed;with call bell/phone within reach;with family/visitor present     Time: 1152-1200 PT Time Calculation (min) (ACUTE ONLY): 8 min  Charges:  $Gait Training: 8-22 mins                    G Codes:      [name] 01/05/14, 12:54 PM  [employer] PT [phone]

## 2013-12-12 NOTE — Progress Notes (Signed)
ANTICOAGULATION CONSULT NOTE - Follow Up Consult  Pharmacy Consult:  Coumadin Indication: atrial fibrillation  No Known Allergies  Patient Measurements: Height: 5\' 8"  (172.7 cm) Weight: 204 lb 9.6 oz (92.806 kg) (Scale A) IBW/kg (Calculated) : 68.4  Vital Signs: Temp: 97.6 F (36.4 C) (11/30 0548) Temp Source: Oral (11/30 0548) BP: 100/54 mmHg (11/30 0548) Pulse Rate: 95 (11/30 0548)  Labs:  Recent Labs  12/10/13 0243 12/11/13 0320 12/11/13 1933 12/12/13 0435  HGB 11.7* 10.9*  --  10.7*  HCT 34.1* 31.9*  --  31.9*  PLT 139* 143*  --  136*  LABPROT  --   --  14.2 14.0  INR  --   --  1.09 1.06  CREATININE 2.05* 2.42*  --  2.33*    Estimated Creatinine Clearance: 27.5 mL/min (by C-G formula based on Cr of 2.33).    Assessment: 46 YOM with AFib to continue on Coumadin.  INR is sub-therapeutic as expected.  No bleeding reported.  Noted amiodarone is new to patient.   Goal of Therapy:  INR 2-3    Plan:  - Coumadin 7.5mg  PO today - Daily PT / INR - Consider increasing Lantus for better glycemic control    Wania Longstreth D. Mina Marble, PharmD, BCPS Pager:  [phone] 12/12/2013, 8:37 AM

## 2013-12-12 NOTE — Progress Notes (Addendum)
Subjective:  Feels much improved, shortness of breath also improved. Patient ambulated in the hallway without any problems. Patient remains in a. Fib with rate 90-100.   Objective:  Vital Signs in the last 24 hours: Temp:  [97.6 F (36.4 C)-98 F (36.7 C)] 97.6 F (36.4 C) (11/30 0548) Pulse Rate:  [49-95] 95 (11/30 0548) Resp:  [18] 18 (11/30 0548) BP: (100-109)/(54-90) 100/54 mmHg (11/30 0548) SpO2:  [95 %-100 %] 100 % (11/30 0548) Weight:  [92.806 kg (204 lb 9.6 oz)] 92.806 kg (204 lb 9.6 oz) (11/30 0548)  Intake/Output from previous day: 11/29 0701 - 11/30 0700 In: 1148.3 [P.O.:960; I.V.:188.3] Out: 1750 [Urine:1750]  Physical Exam: General appearance: alert, cooperative, no distress and appears younger than stated age Throat: lips, mucosa, and tongue normal; teeth and gums normal Neck: no adenopathy, no carotid bruit, no JVD, supple, symmetrical, trachea midline and thyroid not enlarged, symmetric, no tenderness/mass/nodules Lungs: coarse lung sounds throughout wit expiratory wheeze Chest wall: no tenderness Heart: irregularly irregular rhythm, S1: variable and S2: normal, no gallops, no murmurs Extremities: chronic edema in left lower ankle and foot, 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 There is trace edema in right LE Pulses: radial pulses 2+, femoral pulses 2+, popliteal pulses 2+, pedal pulses feeble bilaterally. No carotid bruit.  Lab Results: BMP  Recent Labs  12/10/13 0243 12/11/13 0320 12/12/13 0435  NA 134* 135* 137  K 3.7 4.4 3.9  CL 94* 95* 97  CO2 26 27 29   GLUCOSE 210* 263* 216*  BUN 46* 50* 48*  CREATININE 2.05* 2.42* 2.33*  CALCIUM 8.5 8.5 8.5  GFRNONAA 29* 24* 25*  GFRAA 33* 27* 29*    CBC  Recent Labs Lab 12/10/13 0243  12/12/13 0435  WBC 9.6  < > 8.9  RBC 3.91*  < > 3.64*  HGB 11.7*  < > 10.7*  HCT 34.1*  < > 31.9*  PLT 139*  < > 136*  MCV 87.2  < > 87.6  MCH 29.9  < > 29.4  MCHC 34.3  < > 33.5  RDW 12.9  < > 13.0  LYMPHSABS  2.5  --   --   MONOABS 0.8  --   --   EOSABS 0.3  --   --   BASOSABS 0.0  --   --   < > = values in this interval not displayed.  HEMOGLOBIN A1C Lab Results  Component Value Date   HGBA1C 8.8* 12/10/2013   MPG 206* 12/10/2013    Cardiac Panel (last 3 results)  Recent Labs  12/07/13 1403 12/07/13 2042  TROPONINI <0.30 <0.30    BNP (last 3 results)  Recent Labs  12/10/13 0243 12/11/13 0320 12/12/13 0435  PROBNP 2284.0* 2480.0* 3116.0*    TSH  Recent Labs  03/11/13 1313 06/13/13 1330  TSH 2.934 1.42    CHOLESTEROL  Recent Labs  12/08/13 0348  CHOL 142    Hepatic Function Panel  Recent Labs  12/07/13 1403  PROT 6.2  ALBUMIN 3.3*  AST 14  ALT 19  ALKPHOS 63  BILITOT 0.6   BNP (last 3 results)  Recent Labs  12/10/13 0243 12/11/13 0320 12/12/13 0435  PROBNP 2284.0* 2480.0* 3116.0*     Cardiac Studies: Telemetry: Atrial fibrillation with controlled ventricular response. Echocardiogram 12/09/2013: Left ventricle: The cavity size was normal. Wall thickness was increased in a pattern of severe LVH. Systolic function was moderately reduced. The estimated ejection fraction was in the range of 35% to 40%. Diffuse  hypokinesis. The study is not technically sufficient to allow evaluation of LV diastolic function. - Mitral valve: Mildly thickened leaflets . There was mildregurgitation. - Left atrium: The atrium was at the upper limits of normal insize. - Right atrium: Moderately dilated. - Tricuspid valve: There was mild regurgitation. - Pulmonary arteries: PA peak pressure: 30 mm Hg (S).  CT angiogram chest 12/07/2013:  Small area of infiltrate in the lateral segment of the left lower lobe. There is associated subsegmental atelectasis in this area. Mild bibasilar lung scarring. There is relatively mild lower lobe bronchiectasis bilaterally. No demonstrable adenopathy. Multiple foci of coronary artery calcification. Spinal stenosis at L1-2  and L2-3, multifactorial.  Assessment/Plan:  1. Atrial fibrillation with moderate RVR: symptomatic, CHA2DS2-VASCScore: Risk Score 4, Yearly risk of stroke 4.0. [place] Has Bled: Score 2. Estimated bleeding risk: 1.88-3.2%/year.  Now on Warfarin. He lives in [place] and need to set up INR check with his PCP. I will be happy to do it in our office, depending upon patient preference.  2. Acute systolic and diastolic Heart Failure: LVEF 35-40% on repeat echocardiogram.  Echocardiogram performed 2 weeks ago [facility] had revealed ejection fraction of 50-55%. Not sure if this was not an error. 3. Diabetes mellitus type 2 4. Hypertension 5. Hyperlipidemia 6. Diabetes mellitus with chronic kidney disease, stage III. Creatinine clearance between 29-36 mL per minute. 7. Coronary calcification by CT angiogram of the chest. 8. Bilateral basilar bronchiectasis  Recommendation: Patient will be continued on amiodarone 400 mg by mouth 3 times a day. D/C coreg due to bronchospasm and start Metoprolol 25 mg po tid for rate control of A. Fib. Wait for INR to be therapeutic prior to discharge  BNP is increasing and no significant change in admission weight. Will give one dose of Lasix 80mg  IV today. Stress testing may be performed on an outpatient basis.  Cardiac rehabilitation has been initiated.  Patient otherwise feels well and his dyspnea has improved.  Labs are stable. Fine with Coumadin. Hoping once amio level builds up, HR will be better controlled.   Rachel Bo, NP-C 12/12/2013, 8:51 AM [facility] Pager: [phone] Office: [phone] If no answer: [phone]  I have personally supervised Ms. Ebony Hail and seen examined the patient

## 2013-12-13 DIAGNOSIS — Z87891 Personal history of nicotine dependence: Secondary | ICD-10-CM

## 2013-12-13 DIAGNOSIS — I10 Essential (primary) hypertension: Secondary | ICD-10-CM

## 2013-12-13 DIAGNOSIS — Z794 Long term (current) use of insulin: Secondary | ICD-10-CM

## 2013-12-13 DIAGNOSIS — Z7901 Long term (current) use of anticoagulants: Secondary | ICD-10-CM

## 2013-12-13 DIAGNOSIS — I517 Cardiomegaly: Secondary | ICD-10-CM

## 2013-12-13 LAB — GLUCOSE, CAPILLARY
GLUCOSE-CAPILLARY: 151 mg/dL — AB (ref 70–99)
Glucose-Capillary: 121 mg/dL — ABNORMAL HIGH (ref 70–99)
Glucose-Capillary: 166 mg/dL — ABNORMAL HIGH (ref 70–99)
Glucose-Capillary: 165 mg/dL — ABNORMAL HIGH (ref 70–99)

## 2013-12-13 LAB — PROTIME-INR
INR: 1.1 (ref 0.00–1.49)
Prothrombin Time: 14.3 seconds (ref 11.6–15.2)

## 2013-12-13 LAB — BASIC METABOLIC PANEL
Anion gap: 14 (ref 5–15)
BUN: 44 mg/dL — ABNORMAL HIGH (ref 6–23)
CHLORIDE: 95 meq/L — AB (ref 96–112)
CO2: 29 meq/L (ref 19–32)
Calcium: 8.7 mg/dL (ref 8.4–10.5)
Creatinine, Ser: 2.35 mg/dL — ABNORMAL HIGH (ref 0.50–1.35)
GFR calc Af Amer: 28 mL/min — ABNORMAL LOW (ref >90)
GFR calc non Af Amer: 24 mL/min — ABNORMAL LOW (ref >90)
GLUCOSE: 105 mg/dL — AB (ref 70–99)
POTASSIUM: 3.5 meq/L — AB (ref 3.7–5.3)
SODIUM: 138 meq/L (ref 137–147)

## 2013-12-13 LAB — CBC
HCT: 33 % — ABNORMAL LOW (ref 39.0–52.0)
HEMOGLOBIN: 11.2 g/dL — AB (ref 13.0–17.0)
MCH: 29.9 pg (ref 26.0–34.0)
MCHC: 33.9 g/dL (ref 30.0–36.0)
MCV: 88 fL (ref 78.0–100.0)
PLATELETS: 157 10*3/uL (ref 150–400)
RBC: 3.75 MIL/uL — AB (ref 4.22–5.81)
RDW: 13.1 % (ref 11.5–15.5)
WBC: 9.7 10*3/uL (ref 4.0–10.5)

## 2013-12-13 LAB — PRO B NATRIURETIC PEPTIDE: PRO B NATRI PEPTIDE: 3799 pg/mL — AB (ref 0–450)

## 2013-12-13 MED ORDER — AMIODARONE HCL 200 MG PO TABS
400.0000 mg | ORAL_TABLET | Freq: Two times a day (BID) | ORAL | Status: DC
  Filled 2013-12-13: qty 2

## 2013-12-13 MED ORDER — LINAGLIPTIN 5 MG PO TABS: 5.0000 mg | ORAL_TABLET | Freq: Every day | ORAL | Status: DC

## 2013-12-13 MED ORDER — LINAGLIPTIN 5 MG PO TABS
5.0000 mg | ORAL_TABLET | Freq: Every day | ORAL | Status: DC
Start: 2013-12-13 — End: 2013-12-13
  Administered 2013-12-13: 5 mg via ORAL
  Filled 2013-12-13: qty 1

## 2013-12-13 MED ORDER — AMIODARONE HCL 400 MG PO TABS: 400.0000 mg | ORAL_TABLET | Freq: Two times a day (BID) | ORAL | Status: DC

## 2013-12-13 MED ORDER — POTASSIUM CHLORIDE CRYS ER 20 MEQ PO TBCR
20.0000 meq | EXTENDED_RELEASE_TABLET | Freq: Once | ORAL | Status: AC
  Administered 2013-12-13: 20 meq via ORAL
  Filled 2013-12-13: qty 1

## 2013-12-13 MED ORDER — RIVAROXABAN 15 MG PO TABS
15.0000 mg | ORAL_TABLET | Freq: Every day | ORAL | Status: DC
  Administered 2013-12-13: 15 mg via ORAL
  Filled 2013-12-13: qty 1

## 2013-12-13 MED ORDER — WARFARIN SODIUM 10 MG PO TABS
10.0000 mg | ORAL_TABLET | Freq: Once | ORAL | Status: DC
  Filled 2013-12-13: qty 1

## 2013-12-13 MED ORDER — GABAPENTIN 300 MG PO CAPS: 300.0000 mg | ORAL_CAPSULE | Freq: Two times a day (BID) | ORAL | Status: DC

## 2013-12-13 MED ORDER — ATORVASTATIN CALCIUM 10 MG PO TABS: 10.0000 mg | ORAL_TABLET | Freq: Every day | ORAL | Status: DC

## 2013-12-13 MED ORDER — RIVAROXABAN 15 MG PO TABS: 15.0000 mg | ORAL_TABLET | Freq: Every day | ORAL | Status: DC

## 2013-12-13 MED ORDER — METOPROLOL TARTRATE 25 MG PO TABS: 25.0000 mg | ORAL_TABLET | Freq: Three times a day (TID) | ORAL | Status: DC

## 2013-12-13 MED ORDER — GLIMEPIRIDE 1 MG PO TABS: 1.0000 mg | ORAL_TABLET | Freq: Every day | ORAL | Status: DC

## 2013-12-13 MED ORDER — ATORVASTATIN CALCIUM 10 MG PO TABS
10.0000 mg | ORAL_TABLET | Freq: Every day | ORAL | Status: DC
  Administered 2013-12-13: 10 mg via ORAL
  Filled 2013-12-13: qty 1

## 2013-12-13 MED ORDER — ISOSORB DINITRATE-HYDRALAZINE 20-37.5 MG PO TABS: 1.0000 | ORAL_TABLET | Freq: Three times a day (TID) | ORAL | Status: DC

## 2013-12-13 NOTE — Progress Notes (Signed)
Discharge instructions given. Pt verbalized understanding and all questions were answered.  

## 2013-12-13 NOTE — Discharge Instructions (Addendum)
Stop taking Hytrin (terazosin).  Information on my medicine - XARELTO (Rivaroxaban)  This medication education was reviewed with me or my healthcare representative as part of my discharge preparation.  The pharmacist that spoke with me during my hospital stay was:  Saundra Shelling, [facility]  Why was Xarelto prescribed for you? Xarelto was prescribed for you to reduce the risk of a blood clot forming that can cause a stroke if you have a medical condition called atrial fibrillation (a type of irregular heartbeat).  What do you need to know about xarelto ? Take your Xarelto ONCE DAILY at the same time every day with your evening meal. If you have difficulty swallowing the tablet whole, you may crush it and mix in applesauce just prior to taking your dose.  Take Xarelto exactly as prescribed by your doctor and DO NOT stop taking Xarelto without talking to the doctor who prescribed the medication.  Stopping without other stroke prevention medication to take the place of Xarelto may increase your risk of developing a clot that causes a stroke.  Refill your prescription before you run out.  After discharge, you should have regular check-up appointments with your healthcare provider that is prescribing your Xarelto.  In the future your dose may need to be changed if your kidney function or weight changes by a significant amount.  What do you do if you miss a dose? If you are taking Xarelto ONCE DAILY and you miss a dose, take it as soon as you remember on the same day then continue your regularly scheduled once daily regimen the next day. Do not take two doses of Xarelto at the same time or on the same day.   Important Safety Information A possible side effect of Xarelto is bleeding. You should call your healthcare provider right away if you experience any of the following: ? Bleeding from an injury or your nose that does not stop. ? Unusual colored urine (red or dark brown) or unusual colored  stools (red or black). ? Unusual bruising for unknown reasons. ? A serious fall or if you hit your head (even if there is no bleeding).  Some medicines may interact with Xarelto and might increase your risk of bleeding while on Xarelto. To help avoid this, consult your healthcare provider or pharmacist prior to using any new prescription or non-prescription medications, including herbals, vitamins, non-steroidal anti-inflammatory drugs (NSAIDs) and supplements.  This website has more information on Xarelto: [email or web address]-benson.[email or web address]

## 2013-12-13 NOTE — Progress Notes (Signed)
ANTICOAGULATION CONSULT NOTE - Follow Up Consult  Pharmacy Consult:  Coumadin Indication: atrial fibrillation  No Known Allergies  Patient Measurements: Height: 5\' 8"  (172.7 cm) Weight: 204 lb 9.4 oz (92.8 kg) IBW/kg (Calculated) : 68.4  Vital Signs: Temp: 97.7 F (36.5 C) (12/01 0429) Temp Source: Oral (12/01 0429) BP: 94/61 mmHg (12/01 0429) Pulse Rate: 86 (12/01 0429)  Labs:  Recent Labs  12/11/13 0320 12/11/13 1933 12/12/13 0435 12/13/13 0320  HGB 10.9*  --  10.7* 11.2*  HCT 31.9*  --  31.9* 33.0*  PLT 143*  --  136* 157  LABPROT  --  14.2 14.0 14.3  INR  --  1.09 1.06 1.10  CREATININE 2.42*  --  2.33* 2.35*    Estimated Creatinine Clearance: 27.3 mL/min (by C-G formula based on Cr of 2.35).    Assessment: 31 YOM with AFib to continue on Coumadin.  INR is sub-therapeutic; no bleeding reported.  Noted amiodarone is new to patient.   Goal of Therapy:  INR 2-3    Plan:  - Coumadin 10mg  PO today - Daily PT / INR - Consider increasing Lantus for better glycemic control    Tula Schryver D. Mina Marble, PharmD, BCPS Pager:  [phone] 12/13/2013, 8:22 AM

## 2013-12-13 NOTE — Progress Notes (Signed)
CARDIAC REHAB PHASE I   PRE:  Rate/Rhythm: 76 afib  BP:  Supine:   Sitting: 90/53  Standing:    SaO2: 98-100%RA  MODE:  Ambulation: 540 ft   POST:  Rate/Rhythm: 101-107  BP:  Supine:   Sitting: 113/71  Standing:    SaO2: 99%RA 1015-1050 Pt walked 540 ft with hand held asst and tolerated well. No DOE noted. Gave pt and daughter OFF THE BEAT booklet for atrial fib and explained need for blood thinner. Gave CHF booklet and reviewed zones. Pt weighs self already every day and keeps record. Discussed notifying MD if 3 pound gain in l day or 5 in week. Discussed watching  Sodium. Discussed when to call MD- yellow zone. Pt stated will read later.   William Good, RN BSN  12/13/2013 10:51 AM

## 2013-12-13 NOTE — Progress Notes (Signed)
Nutrition Education Note  RD consulted for nutrition education regarding new onset CHF.  RD provided "Heart Failure Nutrition Therapy" handout from the Academy of [employer] Dietetics. Reviewed patient's dietary recall. Provided examples on ways to decrease sodium intake in diet. Discouraged intake of processed foods and use of salt shaker. Encouraged fresh fruits and vegetables as well as whole grain sources of carbohydrates to maximize fiber intake.   RD discussed why it is important for patient to adhere to diet recommendations, and emphasized the role of fluids, foods to avoid, and importance of weighing self daily. Teach back method used.  Expect good compliance.  Body mass index is 31.11 kg/(m^2). Pt meets criteria for Obesity based on current BMI.  Current diet order is Heart healthy/Carb Modified, patient is consuming approximately 100% of meals at this time. Labs and medications reviewed. No further nutrition interventions warranted at this time. RD contact information provided. If additional nutrition issues arise, please re-consult RD.   Pryor Ochoa RD, LDN Inpatient Clinical Dietitian Pager: [phone] After Hours Pager: [phone]

## 2013-12-13 NOTE — Progress Notes (Signed)
Visited patient and his wife, Vira Agar, at bedside to offer [place] [facility] services as benefit of his Medicare and Primary Care MD in network. Explained patient will  receive post hospital calls and be evaluated for home visits. Patient agreeable to services and consent form signed. Will follow up with Inpatient RNCM about services.  Of note, [facility] Care Management services does not replace or interfere with any services that are arranged by inpatient case management or social work. For additional questions or referrals please contact, Natividad Brood, RN BSN CCM, [facility] Liaison at [phone].

## 2013-12-13 NOTE — Progress Notes (Addendum)
Subjective:  Feels much improved, shortness of breath also improved. Patient ambulated in the hallway without any problems. Patient remains in a. Fib with rate well controlled. Patient states he prefers to be on Xarelto and also states he is ready for discharge.   Objective:  Vital Signs in the last 24 hours: Temp:  [97.4 F (36.3 C)-97.8 F (36.6 C)] 97.7 F (36.5 C) (12/01 0429) Pulse Rate:  [76-91] 81 (12/01 1009) Resp:  [18] 18 (12/01 0429) BP: (94-101)/(54-61) 96/54 mmHg (12/01 1009) SpO2:  [97 %-100 %] 97 % (12/01 0429) Weight:  [92.8 kg (204 lb 9.4 oz)] 92.8 kg (204 lb 9.4 oz) (12/01 0429)  Intake/Output from previous day: 11/30 0701 - 12/01 0700 In: 1200 [P.O.:1200] Out: 1076 [Urine:1075; Stool:1]  Physical Exam: General appearance: alert, cooperative, no distress and appears younger than stated age Throat: lips, mucosa, and tongue normal; teeth and gums normal Neck: no adenopathy, no carotid bruit, no JVD, supple, symmetrical, trachea midline and thyroid not enlarged, symmetric, no tenderness/mass/nodules Lungs: Clear to auscultate except at base occasional expiratory ronchi Chest wall: no tenderness Heart: irregularly irregular rhythm, S1: variable and S2: normal, no gallops, no murmurs Extremities: chronic edema in left lower ankle and foot, 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 There is trace edema in right LE Pulses: radial pulses 2+, femoral pulses 2+, popliteal pulses 2+, pedal pulses feeble bilaterally. No carotid bruit.  Lab Results: BMP  Recent Labs  12/11/13 0320 12/12/13 0435 12/13/13 0320  NA 135* 137 138  K 4.4 3.9 3.5*  CL 95* 97 95*  CO2 27 29 29   GLUCOSE 263* 216* 105*  BUN 50* 48* 44*  CREATININE 2.42* 2.33* 2.35*  CALCIUM 8.5 8.5 8.7  GFRNONAA 24* 25* 24*  GFRAA 27* 29* 28*    CBC  Recent Labs Lab 12/10/13 0243  12/13/13 0320  WBC 9.6  < > 9.7  RBC 3.91*  < > 3.75*  HGB 11.7*  < > 11.2*  HCT 34.1*  < > 33.0*  PLT 139*  < > 157   MCV 87.2  < > 88.0  MCH 29.9  < > 29.9  MCHC 34.3  < > 33.9  RDW 12.9  < > 13.1  LYMPHSABS 2.5  --   --   MONOABS 0.8  --   --   EOSABS 0.3  --   --   BASOSABS 0.0  --   --   < > = values in this interval not displayed.  HEMOGLOBIN A1C Lab Results  Component Value Date   HGBA1C 8.8* 12/10/2013   MPG 206* 12/10/2013    Cardiac Panel (last 3 results)  Recent Labs  12/07/13 1403 12/07/13 2042  TROPONINI <0.30 <0.30    BNP (last 3 results)  Recent Labs  12/11/13 0320 12/12/13 0435 12/13/13 0320  PROBNP 2480.0* 3116.0* 3799.0*    TSH  Recent Labs  03/11/13 1313 06/13/13 1330  TSH 2.934 1.42    CHOLESTEROL  Recent Labs  12/08/13 0348  CHOL 142    Hepatic Function Panel  Recent Labs  12/07/13 1403  PROT 6.2  ALBUMIN 3.3*  AST 14  ALT 19  ALKPHOS 63  BILITOT 0.6   BNP (last 3 results)  Recent Labs  12/11/13 0320 12/12/13 0435 12/13/13 0320  PROBNP 2480.0* 3116.0* 3799.0*     Cardiac Studies: Telemetry: Atrial fibrillation with controlled ventricular response. Echocardiogram 12/09/2013: Left ventricle: The cavity size was normal. Wall thickness was increased in a pattern of severe  LVH. Systolic function was moderately reduced. The estimated ejection fraction was in the range of 35% to 40%. Diffuse hypokinesis. The study is not technically sufficient to allow evaluation of LV diastolic function. - Mitral valve: Mildly thickened leaflets . There was mildregurgitation. - Left atrium: The atrium was at the upper limits of normal insize. - Right atrium: Moderately dilated. - Tricuspid valve: There was mild regurgitation. - Pulmonary arteries: PA peak pressure: 30 mm Hg (S).  CT angiogram chest 12/07/2013:  Small area of infiltrate in the lateral segment of the left lower lobe. There is associated subsegmental atelectasis in this area. Mild bibasilar lung scarring. There is relatively mild lower lobe bronchiectasis bilaterally. No  demonstrable adenopathy. Multiple foci of coronary artery calcification. Spinal stenosis at L1-2 and L2-3, multifactorial.  Assessment/Plan:  1. Atrial fibrillation with controlled V- Response on Amiodarone and Metoprolol.  CHA2DS2-VASCScore: Risk Score 4, Yearly risk of stroke 4.0. [place] Has Bled: Score 2. Estimated bleeding risk: 1.88-3.2%/year.  2. Acute systolic and diastolic Heart Failure: LVEF 35-40% on repeat echocardiogram.  Echocardiogram performed 2 weeks ago [facility] had revealed ejection fraction of 50-55%. Not sure if this was not an error. 3. Diabetes mellitus type 2 4. Hypertension 5. Hyperlipidemia 6. Diabetes mellitus with chronic kidney disease, stage III. Creatinine clearance between 29-36 mL per minute. 7. Coronary calcification by CT angiogram of the chest. 8. Bilateral basilar bronchiectasis  Recommendation:  Patient is presently doing well and states that he is back to his baseline. After review of his labs, I feel comfortable in starting him on Xarelto 15 mg by mouth every afternoon after dinner. Patient also prefers to be on this. I had a long discussion regarding the risks of bleeding associated with long-term anticoagulation. I also will be decreasing his amiodarone eventually to 200 mg by mouth daily hence the infracture will be much less.  Patient should be stable for discharge if he does go on [place], I have discussed this with the house staff, poor in agreement. I will start him on Xarelto 15 mg by mouth daily after dinner, and also decrease his amiodarone to 400 mg by mouth twice a day and add furosemide 40 mg by mouth every morning. I will see him in the office in one week to 10 days. He'll be scheduled for outpatient workup including stress testing and possible cardio version in 2-3 weeks. Placing orders as per discussions with house staff. Also changing Prava to atorva due  To renal excretion. Laverda Page, NP-C 12/13/2013, 11:36 AM [facility] Pager: [phone] Office: [phone] If no answer: [phone]

## 2013-12-13 NOTE — Progress Notes (Signed)
Subjective: Reports he is doing well this morning. Denies fevers/chills, chest pain, shortness of breath, palpitations, nausea or vomiting.  Objective: Vital signs in last 24 hours: Filed Vitals:   12/12/13 0548 12/12/13 1415 12/12/13 2111 12/13/13 0429  BP: 100/54 101/61 95/56 94/61   Pulse: 95 91 76 86  Temp: 97.6 F (36.4 C) 97.8 F (36.6 C) 97.4 F (36.3 C) 97.7 F (36.5 C)  TempSrc: Oral Oral Oral Oral  Resp: 18 18 18 18   Height:      Weight: 204 lb 9.6 oz (92.806 kg)   204 lb 9.4 oz (92.8 kg)  SpO2: 100% 100% 100% 97%   Weight change: -0.2 oz (-0.006 kg)  Intake/Output Summary (Last 24 hours) at 12/13/13 0856 Last data filed at 12/13/13 0831  Gross per 24 hour  Intake   1200 ml  Output   1676 ml  Net   -476 ml   General: NAD, hard of hearing HEENT: [place]/AT, MMM Lungs: minimal wheezes, otherwise CTAB Cardiac: irregular rate and rhythm, no murmurs GI: +BS, soft, nontender Ext: 1+ BLE edema Neuro: no gross deficits  Lab Results: Basic Metabolic Panel:  Recent Labs Lab 12/12/13 0435 12/13/13 0320  NA 137 138  K 3.9 3.5*  CL 97 95*  CO2 29 29  GLUCOSE 216* 105*  BUN 48* 44*  CREATININE 2.33* 2.35*  CALCIUM 8.5 8.7   Liver Function Tests:  Recent Labs Lab 12/07/13 1403  AST 14  ALT 19  ALKPHOS 63  BILITOT 0.6  PROT 6.2  ALBUMIN 3.3*   CBC:  Recent Labs Lab 12/07/13 1403  12/10/13 0243  12/12/13 0435 12/13/13 0320  WBC 18.4*  < > 9.6  < > 8.9 9.7  NEUTROABS 16.3*  --  5.9  --   --   --   HGB 13.4  < > 11.7*  < > 10.7* 11.2*  HCT 37.2*  < > 34.1*  < > 31.9* 33.0*  MCV 84.2  < > 87.2  < > 87.6 88.0  PLT 152  < > 139*  < > 136* 157  < > = values in this interval not displayed. Cardiac Enzymes:  Recent Labs Lab 12/07/13 1403 12/07/13 2042  TROPONINI <0.30 <0.30   BNP:  Recent Labs Lab 12/11/13 0320 12/12/13 0435 12/13/13 0320  PROBNP 2480.0* 3116.0* 3799.0*   CBG:  Recent Labs Lab 12/11/13 2127 12/12/13 0557  12/12/13 1146 12/12/13 1607 12/12/13 2126 12/13/13 0553  GLUCAP 255* 180* 177* 197* 151* 121*   Hemoglobin A1C:  Recent Labs Lab 12/10/13 0243  HGBA1C 8.8*   Fasting Lipid Panel:  Recent Labs Lab 12/08/13 0348  CHOL 142  HDL 50  LDLCALC 63  TRIG 144  CHOLHDL 2.8   Coagulation:  Recent Labs Lab 12/11/13 1933 12/12/13 0435 12/13/13 0320  LABPROT 14.2 14.0 14.3  INR 1.09 1.06 1.10   Urinalysis:  Recent Labs Lab 12/07/13 1415  COLORURINE YELLOW  LABSPEC 1.009  PHURINE 6.0  GLUCOSEU 250*  HGBUR NEGATIVE  BILIRUBINUR NEGATIVE  KETONESUR NEGATIVE  PROTEINUR NEGATIVE  UROBILINOGEN 0.2  NITRITE NEGATIVE  LEUKOCYTESUR NEGATIVE   Micro Results: Recent Results (from the past 240 hour(s))  Culture, blood (routine x 2)     Status: None (Preliminary result)   Collection Time: 12/07/13 10:02 PM  Result Value Ref Range Status   Specimen Description BLOOD LEFT ARM  Final   Special Requests BOTTLES DRAWN AEROBIC AND ANAEROBIC 10CC  Final   Culture  Setup Time   Final  12/08/2013 04:48 Performed at [facility]    Culture   Final           BLOOD CULTURE RECEIVED NO GROWTH TO DATE CULTURE WILL BE HELD FOR 5 DAYS BEFORE ISSUING A FINAL NEGATIVE REPORT Performed at [facility]    Report Status PENDING  Incomplete  Culture, blood (routine x 2)     Status: None (Preliminary result)   Collection Time: 12/07/13 10:10 PM  Result Value Ref Range Status   Specimen Description BLOOD RIGHT ARM  Final   Special Requests BOTTLES DRAWN AEROBIC AND ANAEROBIC 10CC  Final   Culture  Setup Time   Final    12/08/2013 04:48 Performed at [facility]    Culture   Final           BLOOD CULTURE RECEIVED NO GROWTH TO DATE CULTURE WILL BE HELD FOR 5 DAYS BEFORE ISSUING A FINAL NEGATIVE REPORT Performed at [facility]    Report Status PENDING  Incomplete   Studies/Results: No results found.   Medications: I have reviewed the patient's current  medications. Scheduled Meds: . amiodarone  400 mg Oral TID  . famotidine  20 mg Oral Daily  . finasteride  5 mg Oral Daily  . gabapentin  300 mg Oral BID  . guaiFENesin  200 mg Oral 6 times per day  . insulin aspart  0-20 Units Subcutaneous TID WC  . insulin glargine  10 Units Subcutaneous Daily  . isosorbide-hydrALAZINE  1 tablet Oral TID  . metoprolol tartrate  25 mg Oral TID  . montelukast  10 mg Oral QHS  . pravastatin  40 mg Oral Daily  . senna  1 tablet Oral BID  . tamsulosin  0.4 mg Oral QPC supper  . warfarin  10 mg Oral ONCE-1800  . Warfarin - Pharmacist Dosing Inpatient   Does not apply q1800   Continuous Infusions: None  PRN Meds:.HYDROcodone-acetaminophen, ipratropium-albuterol, temazepam  Assessment/Plan: Active Problems:   Diabetes   Essential hypertension   CHF (congestive heart failure)   Shortness of breath   Atrial fibrillation   LVH (left ventricular hypertrophy) due to hypertensive disease  Afib with RVR: On coumadin. INR 1.1 today. - Cardio recommendations appreciated - Switch coumadin to Xarelto 15mg  daily - continue bidil 20-37.5mg  TID - decrease amiodarone 400mg  TID to BID - continue lopressor 25mg  TID for rate control - Outpatient cardio f/u with possible cardioversion in 2-3 weeks - pt to follow up with Dr. Einar Gip [facility] consulted  Acute systolic and diastolic CHF: Lasix 80 mg given once yesterday secondary to increasing BNP. i/o +13ml. - will continue to monitor - PO Lasix 40mg  daily - outpt stress test - continue cardiac rehab  SOB: likely 2/2 a fib and acute CHF. Resolved - continue home montelukast 10mg  QHS  AKI on CKD4: Cr stable this morning 2.35 from 2.33 - will have pt f/u with nephrology outpatient  DM:  - start linagliptin 5mg  PO daily  RA:  - norco 5-325 q6h prn for pain  Urinary hesitancy:  - continue home meds finasteride 5mg  and flomax 0.4mg  daily - stopped home terazosin  Polypharmacy:  - d/c'd hytrin as he is  already on flomax - decreased gabapentin to 300mg  BID - can continue MTX on d/c in combination with orencia - will change metformin combo pill to another agent as metformin in contraindicated in CKD4 - will need to print prescriptions out on d/c for patient to take to the [place]  with him  FEN: carb modified  DVT ppx: Xarelto  Dispo: Disposition is deferred at this time, awaiting improvement of current medical problems.  Anticipated discharge in approximately 1-2 day(s).   The patient does have a current PCP [name], MD) and does not need an [facility] hospital follow-up appointment after discharge.  The patient does not have transportation limitations that hinder transportation to clinic appointments.  .Services Needed at time of discharge: Y = Yes, Blank = No PT: Home health PT  OT:   RN:   Equipment:   Other:     LOS: 6 days   Jacques Earthly, MD 12/13/2013, 8:56 AM

## 2013-12-13 NOTE — Progress Notes (Signed)
  PROGRESS NOTE MEDICINE TEACHING ATTENDING   Day 6 of stay Patient name: [name]   Medical record number: [id number] Date of birth: 1931/01/12   Brief Summary of Hospital Course: This is an 89 year caucasian old man with past medical history of diabetes type 2, CKD, hyperlipidemia, hypertension, anda chronic lung disease (unclear) and rheumatoid arthritis on methotrexate and anti-TNF therapy. Remote cigarette use - stopped 1957. He has a history of hemithyroidectomy. He presented to our ED after being treated at [facility] for pneumonia and not feeling better. He was admitted by Dr Beryle Beams who decided to hold methotrexate since it can cause pulmonary toxicity. The chest Xray revealed no infiltrates, however a CT angio chest done revealed a small area of infiltrate in the left lower lobe. His EKG revealed atrial fibrillation with moderate RVR. A 2d echo was obtained which revealed an EF of 35-40% and diffuse hypokinesis with a moderately dilated right atrium, mild MR and TR. Cardiology was involved, who classified him as a CHADS-VASC score of 4, and Has Bled score of 2 and it was decided to start the patient on long term anticoagulation.  Current Exam: I met with the patient this morning with the entire team. He appears to be doing well. He is sitting up in his bed with no acute distress. His cardiac exam has irregularly irregular rhythm, otherwise no murmur heard. He has 1+ edema bilaterally in his legs. He has clear lungs. No JVD appreciated.   Pertinent Labs:     Serum Creatinine Trend Lab 12/09/13 0951 12/10/13 0243 12/11/13 0320 12/12/13 0435 12/13/13 0320  CREATININE 2.24* 2.05* 2.42* 2.33* 2.35*    Current Plan: Reviewed Dr Irven Shelling note from today - The patient was initially started on Warfarin but was changed to Xarelto based on patient preference. He will be dosed 15 mg daily per renal dosing for non-valvular atrial fibrillation. I am comfortable with that. His rate is  well controlled. He has been informed about the risks of bleeding with anticoagulation. I would like the patient to be followed up within a week of discharge. I have reviewed this with Dr Randell Patient and Dr Redmond Pulling. I am comfortable with the patient being discharged today.   I have discussed the care of this patient with my IM team residents. Please see the resident note for details.  Madilyn Fireman MD MPH 12/13/2013, 12:33 PM.

## 2013-12-14 LAB — CULTURE, BLOOD (ROUTINE X 2)
Culture: NO GROWTH
Culture: NO GROWTH

## 2013-12-15 DIAGNOSIS — E119 Type 2 diabetes mellitus without complications: Secondary | ICD-10-CM | POA: Diagnosis not present

## 2013-12-15 DIAGNOSIS — N189 Chronic kidney disease, unspecified: Secondary | ICD-10-CM | POA: Diagnosis not present

## 2013-12-15 DIAGNOSIS — I509 Heart failure, unspecified: Secondary | ICD-10-CM | POA: Diagnosis not present

## 2013-12-15 DIAGNOSIS — I4891 Unspecified atrial fibrillation: Secondary | ICD-10-CM | POA: Diagnosis not present

## 2013-12-15 DIAGNOSIS — M069 Rheumatoid arthritis, unspecified: Secondary | ICD-10-CM | POA: Diagnosis not present

## 2013-12-17 DIAGNOSIS — I509 Heart failure, unspecified: Secondary | ICD-10-CM | POA: Diagnosis not present

## 2013-12-17 DIAGNOSIS — N189 Chronic kidney disease, unspecified: Secondary | ICD-10-CM | POA: Diagnosis not present

## 2013-12-17 DIAGNOSIS — I4891 Unspecified atrial fibrillation: Secondary | ICD-10-CM | POA: Diagnosis not present

## 2013-12-17 DIAGNOSIS — E119 Type 2 diabetes mellitus without complications: Secondary | ICD-10-CM | POA: Diagnosis not present

## 2013-12-17 DIAGNOSIS — M069 Rheumatoid arthritis, unspecified: Secondary | ICD-10-CM | POA: Diagnosis not present

## 2013-12-17 NOTE — Discharge Summary (Signed)
Name: [name] MRN: [id number] DOB: 02-Apr-1930 80 y.o. PCP: [name], MD  Date of Admission: 12/07/2013  1:33 PM Date of Discharge: 12/13/2013 Attending Physician: Annia Belt, MD  Discharge Diagnosis: Active Problems:   Diabetes   Essential hypertension   CHF (congestive heart failure)   Shortness of breath   Atrial fibrillation   LVH (left ventricular hypertrophy) due to hypertensive disease  Discharge Medications:   Medication List    STOP taking these medications        diltiazem 360 MG 24 hr capsule  Commonly known as:  CARDIZEM CD     lisinopril 20 MG tablet  Commonly known as:  PRINIVIL,ZESTRIL     metFORMIN 1000 MG tablet  Commonly known as:  GLUCOPHAGE     pravastatin 40 MG tablet  Commonly known as:  PRAVACHOL     terazosin 5 MG capsule  Commonly known as:  HYTRIN      TAKE these medications        amiodarone 400 MG tablet  Commonly known as:  PACERONE  Take 1 tablet (400 mg total) by mouth 2 (two) times daily.     atorvastatin 10 MG tablet  Commonly known as:  LIPITOR  Take 1 tablet (10 mg total) by mouth daily at 6 PM.     DSS 100 MG Caps  Take 100 mg by mouth 2 (two) times daily.     finasteride 5 MG tablet  Commonly known as:  PROSCAR  Take 5 mg by mouth daily.     folic acid 1 MG tablet  Commonly known as:  FOLVITE  Take 1 mg by mouth daily.     furosemide 40 MG tablet  Commonly known as:  LASIX  Take 40 mg by mouth daily.     gabapentin 300 MG capsule  Commonly known as:  NEURONTIN  Take 1 capsule (300 mg total) by mouth 2 (two) times daily.     HYDROcodone-acetaminophen 5-325 MG per tablet  Commonly known as:  NORCO  Take 1 tablet by mouth every 6 (six) hours as needed for moderate pain.     ipratropium-albuterol 0.5-2.5 (3) MG/3ML Soln  Commonly known as:  DUONEB  Take 3 mLs by nebulization every 6 (six) hours as needed (shortness of breath).     isosorbide-hydrALAZINE 20-37.5 MG per tablet  Commonly known  as:  BIDIL  Take 1 tablet by mouth 3 (three) times daily.     linagliptin 5 MG Tabs tablet  Commonly known as:  TRADJENTA  Take 1 tablet (5 mg total) by mouth daily.     Methotrexate (PF) 25 MG/0.4ML Soaj  Inject 25 mg into the skin once a week. On Sunday     metoprolol tartrate 25 MG tablet  Commonly known as:  LOPRESSOR  Take 1 tablet (25 mg total) by mouth 3 (three) times daily.     montelukast 10 MG tablet  Commonly known as:  SINGULAIR  Take 10 mg by mouth at bedtime.     ranitidine 150 MG tablet  Commonly known as:  ZANTAC  Take 150 mg by mouth 2 (two) times daily.     REMICADE IV  Inject into the vein every 6 (six) weeks. Dr Ouida Sills.     Rivaroxaban 15 MG Tabs tablet  Commonly known as:  XARELTO  Take 1 tablet (15 mg total) by mouth daily with supper.     senna 8.6 MG Tabs tablet  Commonly known as:  [place]  Take 1 tablet by mouth 2 (two) times daily.     tamsulosin 0.4 MG Caps capsule  Commonly known as:  FLOMAX  Take 0.4 mg by mouth daily after supper.     temazepam 15 MG capsule  Commonly known as:  RESTORIL  Take 15 mg by mouth at bedtime.     traMADol 50 MG tablet  Commonly known as:  ULTRAM  Take by mouth every 6 (six) hours as needed.        Disposition and follow-up:   Mr.[name] was discharged from [facility] in Stable condition.  At the hospital follow up visit please address:  1.  Atrial fibrillation on 15mg  daily of Xarelto: Rate controlled on discharge. Cardiology planning to decrease amiodarone as outpatient.  2.  Acute systolic and diastolic heart failure: pt will be getting outpatient stress test with cardiology.  3.  DM2: Discontinued metformin due to CKD. Now on linagliptin 5mg  PO daily. Will need adjustment of diabetes medication as outpatient.  2.  Labs / imaging needed at time of follow-up: BMP  3.  Pending labs/ test needing follow-up: none  Follow-up Appointments:     Follow-up Information     Follow up with [facility].   Why:  Registered Nurse and [facility] to start within 24-48 hours of discharge   Contact information:   [address] [place] [address] [phone]       Follow up with Laverda Page, MD On 12/26/2013.   Specialty:  Cardiology   Why:  9 am appointment. Bring all medications and arrive 15 minutes early.   Contact information:   [address] [place] [address] [phone]       Follow up with [name], MD In 1 week.   Specialty:  Family Medicine   Why:  Hospital follow up   Contact information:   [name] [id number] [phone]       Follow up with [facility].   Contact information:   [name] [id number] [phone]       Discharge Instructions: Discharge Instructions    ([place]) Call MD:  Anytime you have any of the following symptoms: 1) 3 pound weight gain in 24 hours or 5 pounds in 1 week 2) shortness of breath, with or without a dry hacking cough 3) swelling in the hands, feet or stomach 4) if you have to sleep on extra pillows at night in order to breathe.    Complete by:  As directed      Call MD for:  difficulty breathing, headache or visual disturbances    Complete by:  As directed      Call MD for:  extreme fatigue    Complete by:  As directed      Call MD for:  persistant dizziness or light-headedness    Complete by:  As directed      Call MD for:  persistant nausea and vomiting    Complete by:  As directed      Call MD for:  severe uncontrolled pain    Complete by:  As directed      Call MD for:  temperature >100.4    Complete by:  As directed      Diet - low sodium heart healthy    Complete by:  As directed      Increase activity slowly    Complete by:  As directed            Consultations: Cardiology  Procedures Performed:  Dg Chest 2 View  12/07/2013   CLINICAL DATA:  Two weeks of shortness  of breath; history of chronic cough ; remote history of tobacco use.  EXAM: CHEST  2 VIEW  COMPARISON:  PA and lateral chest x-ray of January 26, 2013  FINDINGS: The lungs are adequately inflated. There is no focal infiltrate. There are coarse infrahilar lung markings on the right. There is no pleural effusion. The cardiac silhouette and pulmonary vascularity are normal. The trachea is midline. The observed bony thorax exhibits no acute abnormality.  IMPRESSION: Bronchitic changes without evidence of pneumonia, CHF, nor other acute cardiopulmonary abnormality.   Electronically Signed   By: David  Martinique   On: 12/07/2013 15:38   Ct Chest Wo Contrast  12/07/2013   CLINICAL DATA:  Three-week history of shortness of breath and dry cough  EXAM: CT CHEST WITHOUT CONTRAST  TECHNIQUE: Multidetector CT imaging of the chest was performed following the standard protocol without IV contrast material administration.  COMPARISON:  Chest radiograph December 07, 2013  FINDINGS: There is a small area of infiltrate in the lateral segment of the left lower lobe. There is some associated atelectasis in this area. There is mild scarring in the inferior lung bases bilaterally. There is mild lower lobe bronchiectatic change bilaterally as well.  There is a total shoulder replacement on the left causing artifact. There is no appreciable thoracic adenopathy. There is atherosclerotic change in the aorta but no aneurysm. There is no appreciable pulmonary embolus on this noncontrast enhanced study. There are multiple foci of coronary artery calcification. The pericardium is not thickened.  The  In the visualized upper abdomen, there is a parapelvic cyst in the right kidney measuring 3.2 x 2.6 cm.  There is degenerative change in the thoracic spine. There are no blastic or lytic bone lesions. There is spinal stenosis at L1-2 and L2-3, due to diffuse disc protrusion and bony hypertrophy. Thyroid appears unremarkable.  IMPRESSION: Small area  of infiltrate in the lateral segment of the left lower lobe. There is associated subsegmental atelectasis in this area. Mild bibasilar lung scarring. There is relatively mild lower lobe bronchiectasis bilaterally.  No demonstrable adenopathy.  Multiple foci of coronary artery calcification.  Spinal stenosis at L1-2 and L2-3, multifactorial.   Electronically Signed   By: Lowella Grip M.D.   On: 12/07/2013 20:35    2D Echo: Study Conclusions  - Left ventricle: The cavity size was normal. Wall thickness was increased in a pattern of severe LVH. Systolic function was moderately reduced. The estimated ejection fraction was in the range of 35% to 40%. Diffuse hypokinesis. The study is not technically sufficient to allow evaluation of LV diastolic function. - Mitral valve: Mildly thickened leaflets . There was mild regurgitation. - Left atrium: The atrium was at the upper limits of normal in size. - Right atrium: Moderately dilated. - Tricuspid valve: There was mild regurgitation. - Pulmonary arteries: PA peak pressure: 30 mm Hg (S).  Impressions:  - LVEF 35-40%, global hypokinesis, severe LVH, mild MR and TR, RVSP 30 mmHg.   Cardiac Cath: None  Admission HPI: Mr. EMILIO BAYLOCK is an 80 year old man with history of DM, HLD, HTN, hx of rt thyroid lobectomy, and RA who presented to the ED / w/ SOB and coughing. SOB has been present for the past 2-3 months and dry cough has been present for 3  weeks. He was recently admitted on 11/13 for PNA at [facility] and given IV abx and steroids. He was sent home on a Zpack, steroid dose pack, and cefdinir 300mg  BID x 5 days which he states he completed. Pt states his breathing did not improve on discharge and when the home health RN checked on him today she arranged for him to come to the hospital. Pt came to [facility] b/c he did not like [facility] received 3 breathing treatments w/o improvement in breathing. Pt denies fever,  sore throat, weight gain, n/v, HA, joint pain, and pedal edema. States he has a hx of lung disease and was suppose to have left lung removed in the 1950's for unclear reasons. He has hx of thoracentesis in the distant past. He quit smoking in 1967 and previously smoked 1PPD for 15 years.   Pt was noted to have Afib w/ RVR (HR110-140) in the ED. Pt has been told by his family doctor that he has an irregular HR and has never been placed on aspirin. Pt is on cardizem 360mg  daily but does not know when it was started. Denies chest pain, chest palpitations. Endorses chest fullness b/l. He states he lost 13lbs during his admission for PNA 2/2 lasix and decreased appetite.   Hospital Course by problem list: Active Problems:   Diabetes   Essential hypertension   CHF (congestive heart failure)   Shortness of breath   Atrial fibrillation   LVH (left ventricular hypertrophy) due to hypertensive disease   1. Atrial fibrillation w/ RVR: Pt does not know if cardizem is a new medicine that was started on d/c from [facility] or if has been on this in the past. CHADS-VASc score 5. He was admitted and continued on cardizem 30mg  QID, aspirin 81mg  daily. His cardizem was increased to 60mg  QID due to heart rates in the 140s. Cardiology was consulted. He was started on Xarelto 15mg  due to decreased CrCl. He was also started on amiodarone 400mg  TID and was back on cardizem as a gtt on 11/29. The cardizem gtt was discontinued.  He was switched from coreg to metoprolol 25mg  PO TID due to bronchospasm. On discharge, his amiodarone was decreased to BID from TID. Plan for outpatient stress testing by cardiology, possible cardioversion in 2-3 weeks.  2. Acute systolic and diastolic CHF: BNP on admission 4485 (which could be elevated in setting of CKD4). CXR reveals bronchitic changes w/o PNA and acute cardiopulmonary abnormalitis. Denies cardiac hx, swelling in legs. Had a negative stress test 30 years ago, denies having  a ECHO. Was recently started on lasix 40mg  BID during recent hospitalization. Serial troponins negative. Lasix was held as patient appeared dehydrated. His echo demonstrated LVEF of 35-40%. He was changed from cardizem to carvedilol. He was also started on Bidil 20-37.5mg  TID. He was started on Lasix 40mg  BID by IV. BNP was increasing so he was given an additional dose of Lasix 80mg  once. On discharge he was continued on PO lasix 40mg  daily.  3. Shortness of breath: leukocytosis (WBC 18.4) which could also be 2/2 to steroid use, decreased breath sounds on the left vs rt on physical exam. He received vanc/zosyn on [place]. CT chest w/o contrast revealed lower left area of infiltrate not felt to be pneumonia. Procalcitonin wnl. WBC trended down to 9.1 from 18.4. Blood cultures remained with no growth to date. PFTs were wnl. Likely SOB due to heart failure and atrial fibrillation. His SOB improved with diuresis.  4. AKI on CKD4: BUN 55, Cr 2.09, GFR 28, alb 3.3. Na 127 and K+ 3.3. States he was referred to [facility] but never required dialysis. Likely prerenal as pt only ate crackers since arriving to the ED and BUN/Cr >20. He was initially placed on NS w/ KCL 20 mEq at 75cc/hr. This was discontinued the following day. His creatinine continued to trend upwards but then remained stable.. Renal u/s at outside hospital revealed left > right cortical thinning and increased renal parenchymal echogenicity bilaterally consistent with medical renal disease. Patient is to follow up with nephrology as outpatient.  5. DM2: Patient on metformin/glipitin 1000-2.5mg  at home. This was held during his hospitalization. He was started on sensitive sliding scale insulin with lantus daily and continued on gabapentin 300mg  TID. Metformin combo medication was discontinued at discharge due to [place]. He was discharged on linagliptin 5mg  PO daily.  6. Urinary hesitancy: denies hx of BPH. His home meds finasteride 5mg  and Flomax  0.4mg  were continued. His home terazosin was discontinued as he is already on Flomax.    Discharge Vitals:   BP 106/62 mmHg  Pulse 80  Temp(Src) 97.8 F (36.6 C) (Oral)  Resp 20  Ht 5\' 8"  (1.727 m)  Wt 204 lb 9.4 oz (92.8 kg)  BMI 31.11 kg/m2  SpO2 100%  Discharge Labs:  Results for orders placed or performed during the hospital encounter of 12/07/13 (from the past 24 hour(s))  Glucose, capillary     Status: Abnormal   Collection Time: 12/12/13  4:07 PM  Result Value Ref Range   Glucose-Capillary 197 (H) 70 - 99 mg/dL  Glucose, capillary     Status: Abnormal   Collection Time: 12/12/13  9:26 PM  Result Value Ref Range   Glucose-Capillary 151 (H) 70 - 99 mg/dL  Basic metabolic panel     Status: Abnormal   Collection Time: 12/13/13  3:20 AM  Result Value Ref Range   Sodium 138 137 - 147 mEq/L   Potassium 3.5 (L) 3.7 - 5.3 mEq/L   Chloride 95 (L) 96 - 112 mEq/L   CO2 29 19 - 32 mEq/L   Glucose, Bld 105 (H) 70 - 99 mg/dL   BUN 44 (H) 6 - 23 mg/dL   Creatinine, Ser 2.35 (H) 0.50 - 1.35 mg/dL   Calcium 8.7 8.4 - 10.5 mg/dL   GFR calc non Af Amer 24 (L) >90 mL/min   GFR calc Af Amer 28 (L) >90 mL/min   Anion gap 14 5 - 15  CBC     Status: Abnormal   Collection Time: 12/13/13  3:20 AM  Result Value Ref Range   WBC 9.7 4.0 - 10.5 K/uL   RBC 3.75 (L) 4.22 - 5.81 MIL/uL   Hemoglobin 11.2 (L) 13.0 - 17.0 g/dL   HCT 33.0 (L) 39.0 - 52.0 %   MCV 88.0 78.0 - 100.0 fL   MCH 29.9 26.0 - 34.0 pg   MCHC 33.9 30.0 - 36.0 g/dL   RDW 13.1 11.5 - 15.5 %   Platelets 157 150 - 400 K/uL  Protime-INR     Status: None   Collection Time: 12/13/13  3:20 AM  Result Value Ref Range   Prothrombin Time 14.3 11.6 - 15.2 seconds   INR 1.10 0.00 - 1.49  Pro b natriuretic peptide (BNP)     Status: Abnormal   Collection Time: 12/13/13  3:20 AM  Result Value Ref Range   Pro B Natriuretic peptide (BNP) 3799.0 (H) 0 -  450 pg/mL  Glucose, capillary     Status: Abnormal   Collection Time: 12/13/13   5:53 AM  Result Value Ref Range   Glucose-Capillary 121 (H) 70 - 99 mg/dL  Glucose, capillary     Status: Abnormal   Collection Time: 12/13/13 11:15 AM  Result Value Ref Range   Glucose-Capillary 166 (H) 70 - 99 mg/dL   Comment 1 Documented in Chart     Signed: Jacques Earthly, MD 12/13/2013, 1:58 PM    Services Ordered on Discharge: Home health PT Equipment Ordered on Discharge: None

## 2013-12-19 DIAGNOSIS — I480 Paroxysmal atrial fibrillation: Secondary | ICD-10-CM | POA: Diagnosis not present

## 2013-12-19 DIAGNOSIS — I5023 Acute on chronic systolic (congestive) heart failure: Secondary | ICD-10-CM | POA: Diagnosis not present

## 2013-12-20 DIAGNOSIS — M069 Rheumatoid arthritis, unspecified: Secondary | ICD-10-CM | POA: Diagnosis not present

## 2013-12-20 DIAGNOSIS — N189 Chronic kidney disease, unspecified: Secondary | ICD-10-CM | POA: Diagnosis not present

## 2013-12-20 DIAGNOSIS — E119 Type 2 diabetes mellitus without complications: Secondary | ICD-10-CM | POA: Diagnosis not present

## 2013-12-20 DIAGNOSIS — I4891 Unspecified atrial fibrillation: Secondary | ICD-10-CM | POA: Diagnosis not present

## 2013-12-20 DIAGNOSIS — I509 Heart failure, unspecified: Secondary | ICD-10-CM | POA: Diagnosis not present

## 2013-12-22 ENCOUNTER — Other Ambulatory Visit: Payer: Self-pay | Admitting: Pulmonary Disease

## 2013-12-23 DIAGNOSIS — I509 Heart failure, unspecified: Secondary | ICD-10-CM | POA: Diagnosis not present

## 2013-12-23 DIAGNOSIS — M069 Rheumatoid arthritis, unspecified: Secondary | ICD-10-CM | POA: Diagnosis not present

## 2013-12-23 DIAGNOSIS — E119 Type 2 diabetes mellitus without complications: Secondary | ICD-10-CM | POA: Diagnosis not present

## 2013-12-23 DIAGNOSIS — N189 Chronic kidney disease, unspecified: Secondary | ICD-10-CM | POA: Diagnosis not present

## 2013-12-23 DIAGNOSIS — I4891 Unspecified atrial fibrillation: Secondary | ICD-10-CM | POA: Diagnosis not present

## 2013-12-26 DIAGNOSIS — N184 Chronic kidney disease, stage 4 (severe): Secondary | ICD-10-CM | POA: Diagnosis not present

## 2013-12-26 DIAGNOSIS — I480 Paroxysmal atrial fibrillation: Secondary | ICD-10-CM | POA: Diagnosis not present

## 2013-12-26 DIAGNOSIS — I10 Essential (primary) hypertension: Secondary | ICD-10-CM | POA: Diagnosis not present

## 2013-12-26 DIAGNOSIS — E1165 Type 2 diabetes mellitus with hyperglycemia: Secondary | ICD-10-CM | POA: Diagnosis not present

## 2013-12-27 DIAGNOSIS — N183 Chronic kidney disease, stage 3 (moderate): Secondary | ICD-10-CM | POA: Diagnosis not present

## 2013-12-27 DIAGNOSIS — I4891 Unspecified atrial fibrillation: Secondary | ICD-10-CM | POA: Diagnosis not present

## 2013-12-27 DIAGNOSIS — E119 Type 2 diabetes mellitus without complications: Secondary | ICD-10-CM | POA: Diagnosis not present

## 2013-12-27 DIAGNOSIS — I509 Heart failure, unspecified: Secondary | ICD-10-CM | POA: Diagnosis not present

## 2013-12-27 DIAGNOSIS — N189 Chronic kidney disease, unspecified: Secondary | ICD-10-CM | POA: Diagnosis not present

## 2013-12-27 DIAGNOSIS — M069 Rheumatoid arthritis, unspecified: Secondary | ICD-10-CM | POA: Diagnosis not present

## 2013-12-28 ENCOUNTER — Institutional Professional Consult (permissible substitution): Payer: Medicare Other | Admitting: Critical Care Medicine

## 2013-12-28 ENCOUNTER — Other Ambulatory Visit: Payer: Self-pay | Admitting: Pulmonary Disease

## 2013-12-28 ENCOUNTER — Ambulatory Visit (INDEPENDENT_AMBULATORY_CARE_PROVIDER_SITE_OTHER): Payer: Medicare Other | Admitting: Critical Care Medicine

## 2013-12-28 ENCOUNTER — Encounter: Payer: Self-pay | Admitting: Critical Care Medicine

## 2013-12-28 VITALS — BP 130/84 | HR 77 | Ht 68.0 in | Wt 210.0 lb

## 2013-12-28 DIAGNOSIS — J479 Bronchiectasis, uncomplicated: Secondary | ICD-10-CM

## 2013-12-28 DIAGNOSIS — J449 Chronic obstructive pulmonary disease, unspecified: Secondary | ICD-10-CM | POA: Diagnosis not present

## 2013-12-28 MED ORDER — MOMETASONE FUROATE 220 MCG/INH IN AEPB: 2.0000 | INHALATION_SPRAY | Freq: Every day | RESPIRATORY_TRACT | Status: DC

## 2013-12-28 MED ORDER — FLUTTER DEVI: Status: DC

## 2013-12-28 NOTE — Patient Instructions (Signed)
Reflux diet Start asmanex two puff daily Use flutter valve 3-4 times daily Lung function test at [facility] No other changes Tell kidney MD about your xarelto dose Return 2 months [place]

## 2013-12-28 NOTE — Progress Notes (Signed)
Subjective:    Patient ID: [name], male    DOB: October 01, 1930, 80 y.o.   MRN: [id number]  HPI Comments: Pt in hosp for PNA, [place] first x 6days no better and then to [facility] and 6 d rx.  Now is some better. Had some heart issues as well.  Pt ill and adm 11/23/13 , ill at first for two weeks prior. Cardiology is Adrian Prows  Shortness of Breath This is a new problem. The current episode started more than 1 month ago. The problem occurs daily (exertion only). The problem has been gradually improving. Associated symptoms include sputum production and wheezing. Pertinent negatives include no chest pain, ear pain, fever, headaches, hemoptysis, leg pain, leg swelling, neck pain, orthopnea, PND, rash, rhinorrhea, sore throat, swollen glands, syncope or vomiting. The symptoms are aggravated by weather changes, URIs, occupational exposure and exercise. Associated symptoms comments: Could not raise any mucus.  . Risk factors include smoking (pna in [place] ). He has tried nothing for the symptoms. His past medical history is significant for allergies, a heart failure and pneumonia. There is no history of asthma, bronchiolitis, CAD, COPD, DVT, PE or a recent surgery.   Past Medical History  Diagnosis Date  . Anemia   . Hyperlipidemia     takes Pravastatin daily  . Hypertension     takes Cardizem,Proscar,and Lisinopril daily  . Peripheral neuropathy   . Joint pain   . Joint swelling   . GERD (gastroesophageal reflux disease)     takes Zantac daily   . Constipation     takes stool softener daily  . History of blood transfusion     "related to one of my ORs"  . Impaired hearing     wears hearing aids  . Type II diabetes mellitus     takes Metformin daily  . Rheumatoid arthritis     "all over; take orencia  q 4 wks" (02/01/2013)  . Atrial fibrillation 12/08/2013     Family History  Problem Relation Age of Onset  . Cancer Mother     breast  . Aneurysm Father      History   Social  History  . Marital Status: Married    Spouse Name: N/A    Number of Children: N/A  . Years of Education: N/A   Occupational History  . Not on file.   Social History Main Topics  . Smoking status: Former Smoker -- 1.00 packs/day for 15 years    Types: Cigarettes    Quit date: 09/01/1965  . Smokeless tobacco: Never Used     Comment: quit smoking in 1967  . 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 Drug Use: No  . Sexual Activity: No   Other Topics Concern  . Not on file   Social History Narrative     No Known Allergies   Outpatient Prescriptions Prior to Visit  Medication Sig Dispense Refill  . amiodarone (PACERONE) 400 MG tablet Take 1 tablet (400 mg total) by mouth 2 (two) times daily. 60 tablet 0  . atorvastatin (LIPITOR) 10 MG tablet Take 1 tablet (10 mg total) by mouth daily at 6 PM. 30 tablet 0  . Docusate Sodium (DSS) 100 MG CAPS Take 100 mg by mouth 2 (two) times daily.    . finasteride (PROSCAR) 5 MG tablet Take 5 mg by mouth daily.    . folic acid (FOLVITE)  1 MG tablet Take 1 mg by mouth daily.      . furosemide (LASIX) 40 MG tablet Take 40 mg by mouth daily.    [name] gabapentin (NEURONTIN) 300 MG capsule Take 1 capsule (300 mg total) by mouth 2 (two) times daily. 15 capsule 0  . HYDROcodone-acetaminophen (NORCO) 5-325 MG per tablet Take 1 tablet by mouth every 6 (six) hours as needed for moderate pain. 30 tablet 0  . isosorbide-hydrALAZINE (BIDIL) 20-37.5 MG per tablet Take 1 tablet by mouth 3 (three) times daily. 90 tablet 0  . linagliptin (TRADJENTA) 5 MG TABS tablet Take 1 tablet (5 mg total) by mouth daily. 30 tablet 0  . metoprolol tartrate (LOPRESSOR) 25 MG tablet Take 1 tablet (25 mg total) by mouth 3 (three) times daily. 90 tablet 0  . montelukast (SINGULAIR) 10 MG tablet Take 10 mg by mouth at bedtime.    . ranitidine (ZANTAC) 150 MG tablet Take 150 mg by mouth 2 (two) times daily.      . Rivaroxaban  (XARELTO) 15 MG TABS tablet Take 1 tablet (15 mg total) by mouth daily with supper. 30 tablet 0  . Tamsulosin HCl (FLOMAX) 0.4 MG CAPS Take 0.4 mg by mouth daily after supper.     . temazepam (RESTORIL) 15 MG capsule Take 15 mg by mouth at bedtime.      . traMADol (ULTRAM) 50 MG tablet Take by mouth every 6 (six) hours as needed.    [name] ipratropium-albuterol (DUONEB) 0.5-2.5 (3) MG/3ML SOLN Take 3 mLs by nebulization every 6 (six) hours as needed (shortness of breath).    . InFLIXimab (REMICADE IV) Inject into the vein every 6 (six) weeks. Dr Ouida Sills.    . Methotrexate, PF, 25 MG/0.4ML SOAJ Inject 25 mg into the skin once a week. On Sunday    . senna (SENOKOT) 8.6 MG TABS Take 1 tablet by mouth 2 (two) times daily.     No facility-administered medications prior to visit.       Review of Systems  Constitutional: Positive for fatigue. Negative for fever and unexpected weight change.  HENT: Negative for congestion, dental problem, ear pain, nosebleeds, postnasal drip, rhinorrhea, sinus pressure, sneezing, sore throat, tinnitus, trouble swallowing and voice change.   Eyes: Negative for redness and itching.  Respiratory: Positive for cough, sputum production and wheezing. Negative for apnea, hemoptysis, choking, chest tightness, shortness of breath and stridor.   Cardiovascular: Negative for chest pain, palpitations, orthopnea, leg swelling, syncope and PND.  Gastrointestinal: Negative.  Negative for nausea and vomiting.  Genitourinary: Negative for dysuria.  Musculoskeletal: Negative for joint swelling and neck pain.  Skin: Negative for rash.  Neurological: Negative for headaches.  Hematological: Does not bruise/bleed easily.  Psychiatric/Behavioral: Negative for dysphoric mood. The patient is not nervous/anxious.        Objective:   Physical Exam  Filed Vitals:   12/28/13 1356  BP: 130/84  Pulse: 77  Height: 5\' 8"  (1.727 m)  Weight: 210 lb (95.255 kg)  SpO2: 100%    Gen:  Pleasant, well-nourished, in no distress,  normal affect  ENT: No lesions,  mouth clear,  oropharynx clear, no postnasal drip  Neck: No JVD, no TMG, no carotid bruits  Lungs: No use of accessory muscles, no dullness to percussion, expired wheezes  Cardiovascular: RRR, heart sounds normal, no murmur or gallops, no peripheral edema  Abdomen: soft and NT, no HSM,  BS normal  Musculoskeletal: No deformities, no cyanosis or clubbing  Neuro: alert, non  focal  Skin: Warm, no lesions or rashes  No results found.  Radiographs all reviewed.  Bronchiectatic changes seen       Assessment & Plan:   Chronic obstructive airway disease with asthma Suspect airway obstruction on basis of rheumatoid lung disease with airway inflammation exacerbated by GERD Plan Reflux diet Start asmanex two puff daily Use flutter valve 3-4 times daily Lung function test at [place] No other changes   Bronchiectasis without acute exacerbation Lower lobe bronchiectasis with obstruction   Updated Medication List Outpatient Encounter Prescriptions as of 12/28/2013  Medication Sig  . Abatacept (ORENCIA IV) Inject into the vein every 30 (thirty) days.  [name] amiodarone (PACERONE) 400 MG tablet Take 1 tablet (400 mg total) by mouth 2 (two) times daily.  [name] atorvastatin (LIPITOR) 10 MG tablet Take 1 tablet (10 mg total) by mouth daily at 6 PM.  . Docusate Sodium (DSS) 100 MG CAPS Take 100 mg by mouth 2 (two) times daily.  . finasteride (PROSCAR) 5 MG tablet Take 5 mg by mouth daily.  . folic acid (FOLVITE) 1 MG tablet Take 1 mg by mouth daily.    . furosemide (LASIX) 40 MG tablet Take 40 mg by mouth daily.  [name] gabapentin (NEURONTIN) 300 MG capsule Take 1 capsule (300 mg total) by mouth 2 (two) times daily.  [name] HYDROcodone-acetaminophen (NORCO) 5-325 MG per tablet Take 1 tablet by mouth every 6 (six) hours as needed for moderate pain.  . isosorbide-hydrALAZINE (BIDIL) 20-37.5 MG per tablet Take 1 tablet by mouth 3  (three) times daily.  [name] linagliptin (TRADJENTA) 5 MG TABS tablet Take 1 tablet (5 mg total) by mouth daily.  . metoprolol tartrate (LOPRESSOR) 25 MG tablet Take 1 tablet (25 mg total) by mouth 3 (three) times daily.  . montelukast (SINGULAIR) 10 MG tablet Take 10 mg by mouth at bedtime.  . ranitidine (ZANTAC) 150 MG tablet Take 150 mg by mouth 2 (two) times daily.    . Rivaroxaban (XARELTO) 15 MG TABS tablet Take 1 tablet (15 mg total) by mouth daily with supper.  . Tamsulosin HCl (FLOMAX) 0.4 MG CAPS Take 0.4 mg by mouth daily after supper.   . temazepam (RESTORIL) 15 MG capsule Take 15 mg by mouth at bedtime.    . traMADol (ULTRAM) 50 MG tablet Take by mouth every 6 (six) hours as needed.  . [DISCONTINUED] ipratropium-albuterol (DUONEB) 0.5-2.5 (3) MG/3ML SOLN Take 3 mLs by nebulization every 6 (six) hours as needed (shortness of breath).  [name] albuterol (PROVENTIL) (2.5 MG/3ML) 0.083% nebulizer solution Inhale 1 mL into the lungs every 6 (six) hours as needed.  . mometasone (ASMANEX 120 METERED DOSES) 220 MCG/INH inhaler Inhale 2 puffs into the lungs daily.  [name] Respiratory Therapy Supplies (FLUTTER) DEVI Use 3-4 times per day  . [DISCONTINUED] InFLIXimab (REMICADE IV) Inject into the vein every 6 (six) weeks. Dr Ouida Sills.  . [DISCONTINUED] Methotrexate, PF, 25 MG/0.4ML SOAJ Inject 25 mg into the skin once a week. On Sunday  . [DISCONTINUED] senna (SENOKOT) 8.6 MG TABS Take 1 tablet by mouth 2 (two) times daily.

## 2013-12-29 DIAGNOSIS — I129 Hypertensive chronic kidney disease with stage 1 through stage 4 chronic kidney disease, or unspecified chronic kidney disease: Secondary | ICD-10-CM | POA: Diagnosis not present

## 2013-12-29 DIAGNOSIS — R339 Retention of urine, unspecified: Secondary | ICD-10-CM | POA: Diagnosis not present

## 2013-12-29 DIAGNOSIS — D638 Anemia in other chronic diseases classified elsewhere: Secondary | ICD-10-CM | POA: Diagnosis not present

## 2013-12-29 DIAGNOSIS — N183 Chronic kidney disease, stage 3 (moderate): Secondary | ICD-10-CM | POA: Diagnosis not present

## 2013-12-29 DIAGNOSIS — J479 Bronchiectasis, uncomplicated: Secondary | ICD-10-CM | POA: Insufficient documentation

## 2013-12-29 DIAGNOSIS — N179 Acute kidney failure, unspecified: Secondary | ICD-10-CM | POA: Diagnosis not present

## 2013-12-29 NOTE — Assessment & Plan Note (Signed)
Lower lobe bronchiectasis with obstruction

## 2013-12-29 NOTE — Assessment & Plan Note (Signed)
Suspect airway obstruction on basis of rheumatoid lung disease with airway inflammation exacerbated by GERD Plan Reflux diet Start asmanex two puff daily Use flutter valve 3-4 times daily Lung function test at [place] No other changes

## 2014-01-02 DIAGNOSIS — I480 Paroxysmal atrial fibrillation: Secondary | ICD-10-CM | POA: Diagnosis not present

## 2014-01-03 DIAGNOSIS — J479 Bronchiectasis, uncomplicated: Secondary | ICD-10-CM | POA: Diagnosis not present

## 2014-01-03 LAB — PULMONARY FUNCTION TEST

## 2014-01-11 DIAGNOSIS — I10 Essential (primary) hypertension: Secondary | ICD-10-CM | POA: Diagnosis not present

## 2014-01-11 DIAGNOSIS — E119 Type 2 diabetes mellitus without complications: Secondary | ICD-10-CM | POA: Diagnosis not present

## 2014-01-11 DIAGNOSIS — I5023 Acute on chronic systolic (congestive) heart failure: Secondary | ICD-10-CM | POA: Diagnosis not present

## 2014-01-11 DIAGNOSIS — M545 Low back pain: Secondary | ICD-10-CM | POA: Diagnosis not present

## 2014-01-11 DIAGNOSIS — R2689 Other abnormalities of gait and mobility: Secondary | ICD-10-CM | POA: Diagnosis not present

## 2014-01-11 DIAGNOSIS — I482 Chronic atrial fibrillation: Secondary | ICD-10-CM | POA: Diagnosis not present

## 2014-01-11 DIAGNOSIS — M069 Rheumatoid arthritis, unspecified: Secondary | ICD-10-CM | POA: Diagnosis not present

## 2014-01-16 DIAGNOSIS — M069 Rheumatoid arthritis, unspecified: Secondary | ICD-10-CM | POA: Diagnosis not present

## 2014-01-16 DIAGNOSIS — E119 Type 2 diabetes mellitus without complications: Secondary | ICD-10-CM | POA: Diagnosis not present

## 2014-01-16 DIAGNOSIS — I4891 Unspecified atrial fibrillation: Secondary | ICD-10-CM | POA: Diagnosis not present

## 2014-01-16 DIAGNOSIS — I509 Heart failure, unspecified: Secondary | ICD-10-CM | POA: Diagnosis not present

## 2014-01-16 DIAGNOSIS — N189 Chronic kidney disease, unspecified: Secondary | ICD-10-CM | POA: Diagnosis not present

## 2014-01-16 DIAGNOSIS — M0589 Other rheumatoid arthritis with rheumatoid factor of multiple sites: Secondary | ICD-10-CM | POA: Diagnosis not present

## 2014-01-31 ENCOUNTER — Telehealth: Payer: Self-pay | Admitting: Critical Care Medicine

## 2014-01-31 NOTE — Telephone Encounter (Signed)
Only PFT in the record is from 11/2013.  Called and lmom for sherry millikan to call triage back.

## 2014-01-31 NOTE — Telephone Encounter (Signed)
Spoke with Naoma Diener at [facility], needs this patient's pft report signed by PW and faxed back to [facility] hospital medical records.  Crystal please advise. Thanks!

## 2014-01-31 NOTE — Telephone Encounter (Signed)
Called, spoke with pt.  Discussed results and recs per Dr. Joya Gaskins.  Pt verbalized understanding and voiced no further questions or concerns at this time.

## 2014-01-31 NOTE — Telephone Encounter (Signed)
Tell pt pfts show mild obstruction, no change in medications Stay on asmanex

## 2014-02-01 NOTE — Telephone Encounter (Signed)
Original copy addressed by PW yesterday and left at the [facility] office per office process to be sent to [facility].  Results left on Meridith's desk.    lmomtcb to inform Sherri of above -- she will need to contact [place] to obtain original copy.  Note:  Copy of original results placed in PW's scan folder yesterday.

## 2014-02-02 NOTE — Telephone Encounter (Signed)
Spoke with [name], she will contact [place] office to obtain these original copy and will contact office back if needed.

## 2014-02-04 ENCOUNTER — Other Ambulatory Visit: Payer: Self-pay | Admitting: Pulmonary Disease

## 2014-02-06 DIAGNOSIS — I10 Essential (primary) hypertension: Secondary | ICD-10-CM | POA: Diagnosis not present

## 2014-02-06 DIAGNOSIS — I480 Paroxysmal atrial fibrillation: Secondary | ICD-10-CM | POA: Diagnosis not present

## 2014-02-06 DIAGNOSIS — N184 Chronic kidney disease, stage 4 (severe): Secondary | ICD-10-CM | POA: Diagnosis not present

## 2014-02-08 DIAGNOSIS — I4891 Unspecified atrial fibrillation: Secondary | ICD-10-CM | POA: Diagnosis not present

## 2014-02-08 DIAGNOSIS — E119 Type 2 diabetes mellitus without complications: Secondary | ICD-10-CM | POA: Diagnosis not present

## 2014-02-08 DIAGNOSIS — M069 Rheumatoid arthritis, unspecified: Secondary | ICD-10-CM | POA: Diagnosis not present

## 2014-02-08 DIAGNOSIS — N189 Chronic kidney disease, unspecified: Secondary | ICD-10-CM | POA: Diagnosis not present

## 2014-02-08 DIAGNOSIS — I509 Heart failure, unspecified: Secondary | ICD-10-CM | POA: Diagnosis not present

## 2014-02-14 ENCOUNTER — Telehealth: Payer: Self-pay | Admitting: Critical Care Medicine

## 2014-02-14 NOTE — Telephone Encounter (Signed)
Received letter from [facility] stating Asmanex is not on formulary. Per Dr. Joya Gaskins, ok to change to Flovent 110 mcg 2 puffs bid. lmomtcb for pt to advise of above and see which pharm Flovent rx should be sent to

## 2014-02-16 NOTE — Telephone Encounter (Signed)
Spoke with pt's daughter.  Reports pt is out of town and will not be back until today or tomorrow.  She will ask him to call office back.

## 2014-02-20 DIAGNOSIS — E119 Type 2 diabetes mellitus without complications: Secondary | ICD-10-CM | POA: Diagnosis not present

## 2014-02-20 DIAGNOSIS — E780 Pure hypercholesterolemia: Secondary | ICD-10-CM | POA: Diagnosis not present

## 2014-02-20 DIAGNOSIS — E782 Mixed hyperlipidemia: Secondary | ICD-10-CM | POA: Diagnosis not present

## 2014-02-20 DIAGNOSIS — M069 Rheumatoid arthritis, unspecified: Secondary | ICD-10-CM | POA: Diagnosis not present

## 2014-02-20 DIAGNOSIS — E1140 Type 2 diabetes mellitus with diabetic neuropathy, unspecified: Secondary | ICD-10-CM | POA: Diagnosis not present

## 2014-02-20 DIAGNOSIS — I10 Essential (primary) hypertension: Secondary | ICD-10-CM | POA: Diagnosis not present

## 2014-02-20 DIAGNOSIS — N400 Enlarged prostate without lower urinary tract symptoms: Secondary | ICD-10-CM | POA: Diagnosis not present

## 2014-02-21 NOTE — Telephone Encounter (Signed)
lmomtcb for pt 

## 2014-02-22 ENCOUNTER — Encounter: Payer: Self-pay | Admitting: Critical Care Medicine

## 2014-02-23 DIAGNOSIS — M0589 Other rheumatoid arthritis with rheumatoid factor of multiple sites: Secondary | ICD-10-CM | POA: Diagnosis not present

## 2014-02-28 ENCOUNTER — Encounter: Payer: Self-pay | Admitting: Critical Care Medicine

## 2014-02-28 ENCOUNTER — Ambulatory Visit (INDEPENDENT_AMBULATORY_CARE_PROVIDER_SITE_OTHER): Payer: Medicare Other | Admitting: Critical Care Medicine

## 2014-02-28 VITALS — BP 128/60 | HR 68 | Temp 97.6°F | Ht 68.0 in | Wt 215.8 lb

## 2014-02-28 DIAGNOSIS — J479 Bronchiectasis, uncomplicated: Secondary | ICD-10-CM | POA: Diagnosis not present

## 2014-02-28 MED ORDER — FLUTICASONE PROPIONATE HFA 110 MCG/ACT IN AERO: 2.0000 | INHALATION_SPRAY | Freq: Two times a day (BID) | RESPIRATORY_TRACT | Status: DC

## 2014-02-28 NOTE — Patient Instructions (Signed)
When finished with asmanex, you will start flovent hfa two puff twice daily No other changes Return 6 months

## 2014-02-28 NOTE — Progress Notes (Signed)
Subjective:    Patient ID: [name], male    DOB: 1930/11/03, 81 y.o.   MRN: [id number]  HPI Comments: Pt in hosp for PNA, [employer] first x 6days no better and then to [facility] d rx.  Now is some better. Had some heart issues as well.  Pt ill and adm 11/23/13 , ill at first for two weeks prior. Cardiology is Adrian Prows  Shortness of Breath This is a new problem. The current episode started more than 1 month ago. The problem occurs daily (exertion only). The problem has been gradually improving. Associated symptoms include sputum production and wheezing. Pertinent negatives include no chest pain, ear pain, fever, headaches, hemoptysis, leg pain, leg swelling, neck pain, orthopnea, PND, rash, rhinorrhea, sore throat, swollen glands, syncope or vomiting. The symptoms are aggravated by weather changes, URIs, occupational exposure and exercise. Associated symptoms comments: Could not raise any mucus.  . Risk factors include smoking (pna in [place] ). He has tried nothing for the symptoms. His past medical history is significant for allergies, a heart failure and pneumonia. There is no history of asthma, bronchiolitis, CAD, COPD, DVT, PE or a recent surgery.   02/28/2014 Chief Complaint  Patient presents with  . Follow-up    Sob -same,working out with bicycle,cough-dry,occass. wheezing,denies cp or tightness,no fcs   Pt is doing well now.  Notes a dry cough.  Pt is on asmanex daily.  PFTs were ok Pt exercises on bicycle. occ wheezing is noted. No cp or tightness Pt denies any significant sore throat, nasal congestion or excess secretions, fever, chills, sweats, unintended weight loss, pleurtic or exertional chest pain, orthopnea PND, or leg swelling Pt denies any increase in rescue therapy over baseline, denies waking up needing it or having any early am or nocturnal exacerbations of coughing/wheezing/or dyspnea. Pt also denies any obvious fluctuation in symptoms with  weather or environmental  change or other alleviating or aggravating factors    Review of Systems  Constitutional: Positive for fatigue. Negative for fever and unexpected weight change.  HENT: Negative for congestion, dental problem, ear pain, nosebleeds, postnasal drip, rhinorrhea, sinus pressure, sneezing, sore throat, tinnitus, trouble swallowing and voice change.   Eyes: Negative for redness and itching.  Respiratory: Positive for cough, sputum production and wheezing. Negative for apnea, hemoptysis, choking, chest tightness, shortness of breath and stridor.   Cardiovascular: Negative for chest pain, palpitations, orthopnea, leg swelling, syncope and PND.  Gastrointestinal: Negative.  Negative for nausea and vomiting.  Genitourinary: Negative for dysuria.  Musculoskeletal: Negative for joint swelling and neck pain.  Skin: Negative for rash.  Neurological: Negative for headaches.  Hematological: Does not bruise/bleed easily.  Psychiatric/Behavioral: Negative for dysphoric mood. The patient is not nervous/anxious.        Objective:   Physical Exam  Filed Vitals:   02/28/14 1444  BP: 128/60  Pulse: 68  Temp: 97.6 F (36.4 C)  TempSrc: Oral  Height: 5\' 8"  (1.727 m)  Weight: 215 lb 12.8 oz (97.886 kg)  SpO2: 96%    Gen: Pleasant, well-nourished, in no distress,  normal affect  ENT: No lesions,  mouth clear,  oropharynx clear, no postnasal drip  Neck: No JVD, no TMG, no carotid bruits  Lungs: No use of accessory muscles, no dullness to percussion, expired wheezes  Cardiovascular: RRR, heart sounds normal, no murmur or gallops, no peripheral edema  Abdomen: soft and NT, no HSM,  BS normal  Musculoskeletal: No deformities, no cyanosis or clubbing  Neuro: alert, non focal  Skin: Warm, no lesions or rashes  No results found.  Radiographs all reviewed.  Bronchiectatic changes seen   PFTs 01/03/14: FeV1 94% FVC 79% Fev1/FVC 84% TLC 86% DLCO 64%      Assessment & Plan:   Bronchiectasis  without acute exacerbation Bronchiectasis with chronic airflow obstruction secondary to asthma stable this time Plan Maintain Flovent 2 puff twice daily 110 g strength Return 6 months     Updated Medication List Outpatient Encounter Prescriptions as of 02/28/2014  Medication Sig  . Abatacept (ORENCIA IV) Inject into the vein every 30 (thirty) days.  [name] albuterol (PROVENTIL) (2.5 MG/3ML) 0.083% nebulizer solution Inhale 1 mL into the lungs every 6 (six) hours as needed.  [name] amiodarone (PACERONE) 400 MG tablet Take 1 tablet (400 mg total) by mouth 2 (two) times daily.  [name] atorvastatin (LIPITOR) 10 MG tablet Take 1 tablet (10 mg total) by mouth daily at 6 PM.  . Docusate Sodium (DSS) 100 MG CAPS Take 100 mg by mouth 2 (two) times daily.  [name] ELIQUIS 2.5 MG TABS tablet Take 2.5 mg by mouth 2 (two) times daily.  . finasteride (PROSCAR) 5 MG tablet Take 5 mg by mouth daily.  . fluticasone (FLOVENT HFA) 110 MCG/ACT inhaler Inhale 2 puffs into the lungs 2 (two) times daily.  . folic acid (FOLVITE) 1 MG tablet Take 1 mg by mouth daily.    . furosemide (LASIX) 40 MG tablet Take 40 mg by mouth daily.  [name] gabapentin (NEURONTIN) 300 MG capsule Take 1 capsule (300 mg total) by mouth 2 (two) times daily.  [name] HYDROcodone-acetaminophen (NORCO) 5-325 MG per tablet Take 1 tablet by mouth every 6 (six) hours as needed for moderate pain.  . isosorbide-hydrALAZINE (BIDIL) 20-37.5 MG per tablet Take 1 tablet by mouth 3 (three) times daily.  [name] linagliptin (TRADJENTA) 5 MG TABS tablet Take 1 tablet (5 mg total) by mouth daily.  . metoprolol tartrate (LOPRESSOR) 25 MG tablet Take 1 tablet (25 mg total) by mouth 3 (three) times daily.  . mometasone (ASMANEX 120 METERED DOSES) 220 MCG/INH inhaler Inhale 2 puffs into the lungs daily.  . montelukast (SINGULAIR) 10 MG tablet Take 10 mg by mouth at bedtime.  . ranitidine (ZANTAC) 150 MG tablet Take 150 mg by mouth 2 (two) times daily.    [name] Respiratory Therapy Supplies  (FLUTTER) DEVI Use 3-4 times per day  . Rivaroxaban (XARELTO) 15 MG TABS tablet Take 1 tablet (15 mg total) by mouth daily with supper.  . Tamsulosin HCl (FLOMAX) 0.4 MG CAPS Take 0.4 mg by mouth daily after supper.   . temazepam (RESTORIL) 15 MG capsule Take 15 mg by mouth at bedtime.    . traMADol (ULTRAM) 50 MG tablet Take by mouth every 6 (six) hours as needed.

## 2014-02-28 NOTE — Assessment & Plan Note (Signed)
Bronchiectasis with chronic airflow obstruction secondary to asthma stable this time Plan Maintain Flovent 2 puff twice daily 110 g strength Return 6 months

## 2014-02-28 NOTE — Telephone Encounter (Signed)
Spoke with pt.  Discussed below. He verbalized understanding, is in agreement to have Asmanex changed to Flovent, and is aware to call office back if he has any problems or breathing worsens with Flovent.  He verbalized understanding, is in agreement with plan, and is aware [facility].  He voiced no further questions or concerns at this time.

## 2014-03-16 DIAGNOSIS — M0589 Other rheumatoid arthritis with rheumatoid factor of multiple sites: Secondary | ICD-10-CM | POA: Diagnosis not present

## 2014-03-23 DIAGNOSIS — M0589 Other rheumatoid arthritis with rheumatoid factor of multiple sites: Secondary | ICD-10-CM | POA: Diagnosis not present

## 2014-03-23 DIAGNOSIS — M0540 Rheumatoid myopathy with rheumatoid arthritis of unspecified site: Secondary | ICD-10-CM | POA: Diagnosis not present

## 2014-04-05 DIAGNOSIS — I129 Hypertensive chronic kidney disease with stage 1 through stage 4 chronic kidney disease, or unspecified chronic kidney disease: Secondary | ICD-10-CM | POA: Diagnosis not present

## 2014-04-05 DIAGNOSIS — N179 Acute kidney failure, unspecified: Secondary | ICD-10-CM | POA: Diagnosis not present

## 2014-04-05 DIAGNOSIS — D638 Anemia in other chronic diseases classified elsewhere: Secondary | ICD-10-CM | POA: Diagnosis not present

## 2014-04-05 DIAGNOSIS — N183 Chronic kidney disease, stage 3 (moderate): Secondary | ICD-10-CM | POA: Diagnosis not present

## 2014-04-20 DIAGNOSIS — M0589 Other rheumatoid arthritis with rheumatoid factor of multiple sites: Secondary | ICD-10-CM | POA: Diagnosis not present

## 2014-04-21 DIAGNOSIS — H66009 Acute suppurative otitis media without spontaneous rupture of ear drum, unspecified ear: Secondary | ICD-10-CM | POA: Diagnosis not present

## 2014-04-21 DIAGNOSIS — H6123 Impacted cerumen, bilateral: Secondary | ICD-10-CM | POA: Diagnosis not present

## 2014-05-18 DIAGNOSIS — M0589 Other rheumatoid arthritis with rheumatoid factor of multiple sites: Secondary | ICD-10-CM | POA: Diagnosis not present

## 2014-05-22 DIAGNOSIS — Z1211 Encounter for screening for malignant neoplasm of colon: Secondary | ICD-10-CM | POA: Diagnosis not present

## 2014-05-22 DIAGNOSIS — E780 Pure hypercholesterolemia: Secondary | ICD-10-CM | POA: Diagnosis not present

## 2014-05-22 DIAGNOSIS — E782 Mixed hyperlipidemia: Secondary | ICD-10-CM | POA: Diagnosis not present

## 2014-05-22 DIAGNOSIS — Z0000 Encounter for general adult medical examination without abnormal findings: Secondary | ICD-10-CM | POA: Diagnosis not present

## 2014-05-22 DIAGNOSIS — E119 Type 2 diabetes mellitus without complications: Secondary | ICD-10-CM | POA: Diagnosis not present

## 2014-05-22 DIAGNOSIS — I10 Essential (primary) hypertension: Secondary | ICD-10-CM | POA: Diagnosis not present

## 2014-06-15 DIAGNOSIS — M0589 Other rheumatoid arthritis with rheumatoid factor of multiple sites: Secondary | ICD-10-CM | POA: Diagnosis not present

## 2014-06-16 DIAGNOSIS — M0589 Other rheumatoid arthritis with rheumatoid factor of multiple sites: Secondary | ICD-10-CM | POA: Diagnosis not present

## 2014-07-04 NOTE — Patient Outreach (Signed)
[facility] Care Management  07/04/2014  [name] 02/12/1930 [id number]   Referral from [place] [facility], assigned [name], RN to outreach patient.  [name]. [place], [place] Management [place] Assistant Phone: [phone] Fax: [phone]

## 2014-07-05 ENCOUNTER — Other Ambulatory Visit: Payer: Self-pay | Admitting: *Deleted

## 2014-07-05 NOTE — Patient Outreach (Signed)
[facility]) Care Management  07/05/2014  [name] 1930/04/26 [id number]    Telephone call to patient discussed and offered [place] Management services. Patient agrees.  Subjective : Patient  recalls last admission to the hospital for heart rhythm problems and heart failure. [name] reports that he does have a working scale but does not weigh daily and that he checks his blood sugar about once a week, he is unsure of his recent A1c [name] states that he gets along "pretty good" most of the time,lives at home with his wife, states they share the cooking and that his daughter is available to assist with transportation to out of town MD appointments.  Assessment : Patient will benefit from Heart Failure education and management. Patient also voiced concern about cost of B[facility]il [facility] because it is not available at the [place], most other medication he gets from the [facility], reports that his daughter has looked into resources/ discounts on Bidil but has been unsuccessful..  Plan : RNCM will refer to community care manager for Heart Failure education and management, and to pharmacist to  review for BiDil assistance. Will send an in basket message to [name].  William Draft, RN, [place] Care Management [phone]

## 2014-07-10 ENCOUNTER — Other Ambulatory Visit: Payer: Self-pay

## 2014-07-10 ENCOUNTER — Encounter: Payer: Self-pay | Admitting: *Deleted

## 2014-07-10 ENCOUNTER — Other Ambulatory Visit: Payer: Self-pay | Admitting: *Deleted

## 2014-07-10 DIAGNOSIS — I5020 Unspecified systolic (congestive) heart failure: Secondary | ICD-10-CM

## 2014-07-10 NOTE — Patient Outreach (Signed)
[facility]) Care Management  07/10/2014  [name] [birth year] [id number]  Initial Telephone outreach call Subjective: " I'm just resting today"  Assessment   Diabetes : [name] reports that he has not checked his blood sugar on today, states that he does have all the needed supplies, " I just didn't check it" reviewed the last result that I could find on 5/9 A1c 6.9. [name] states that he will start to check it, reports that he is taking his medication for diabetes as prescribed by MD.  Heart Failure : Daily weight this am 218, no increases in weight, denies sob, swelling.Patient voice concern over [facility] pay [facility], as he gets the remainder of his medications at the [facility].   [name] discussed his upcoming appointments at [facility] and for his monthly appointment for arthritis medication infusion this week, he states that he is able to drive himself to these appointments.  Plan: Consult to pharmacy for available resources for Bidil Initial home visit scheduled for July 19, at 1 pm. Education on Heart Failure.  [name], RN, [place] Care Management [phone]

## 2014-07-11 ENCOUNTER — Telehealth: Payer: Self-pay | Admitting: Pharmacist

## 2014-07-11 NOTE — Patient Outreach (Signed)
[facility] [facility]) Care Management  07/11/2014  [name] 03-22-1930 [id number]   Request from [name], RN to assign Pharmacy, request sent to Deanne Coffer, PharmD and Nicoletta Ba, Pharm D Resident.  [name]. [place], [place] Management [place] Assistant Phone: [phone] Fax: [phone]

## 2014-07-11 NOTE — Patient Outreach (Signed)
[place] [facility]) Care Management  [place] [place]   07/11/2014  ANH MANGANO 1930-12-13 [id number]  Subjective: [name] is a 81 y.o. male who was referred to [facility] for medication assistance. The patient is currently on BiDil (hydralazine/isosorbide dinitrate). All of the rest of his medications are through the [place].  I was unable to reach the patient over the phone.   Objective:   Current Medications: Current Outpatient Prescriptions  Medication Sig Dispense Refill  . Abatacept (ORENCIA IV) Inject into the vein every 30 (thirty) days.    [name] albuterol (PROVENTIL) (2.5 MG/3ML) 0.083% nebulizer solution Inhale 1 mL into the lungs every 6 (six) hours as needed.  3  . amiodarone (PACERONE) 400 MG tablet Take 1 tablet (400 mg total) by mouth 2 (two) times daily. 60 tablet 0  . atorvastatin (LIPITOR) 10 MG tablet Take 1 tablet (10 mg total) by mouth daily at 6 PM. 30 tablet 0  . Docusate Sodium (DSS) 100 MG CAPS Take 100 mg by mouth 2 (two) times daily.    [name] ELIQUIS 2.5 MG TABS tablet Take 2.5 mg by mouth 2 (two) times daily.  12  . finasteride (PROSCAR) 5 MG tablet Take 5 mg by mouth daily.    . fluticasone (FLOVENT HFA) 110 MCG/ACT inhaler Inhale 2 puffs into the lungs 2 (two) times daily. 1 Inhaler 12  . folic acid (FOLVITE) 1 MG tablet Take 1 mg by mouth daily.      . furosemide (LASIX) 40 MG tablet Take 40 mg by mouth daily.    [name] gabapentin (NEURONTIN) 300 MG capsule Take 1 capsule (300 mg total) by mouth 2 (two) times daily. 15 capsule 0  . HYDROcodone-acetaminophen (NORCO) 5-325 MG per tablet Take 1 tablet by mouth every 6 (six) hours as needed for moderate pain. (Patient taking differently: Take 1 tablet by mouth daily. ) 30 tablet 0  . isosorbide-hydrALAZINE (BIDIL) 20-37.5 MG per tablet Take 1 tablet by mouth 3 (three) times daily. 90 tablet 0  . linagliptin (TRADJENTA) 5 MG TABS tablet Take 1 tablet (5 mg total) by mouth daily. 30 tablet 0  . metoprolol  tartrate (LOPRESSOR) 25 MG tablet Take 1 tablet (25 mg total) by mouth 3 (three) times daily. (Patient taking differently: Take 25 mg by mouth 2 (two) times daily. ) 90 tablet 0  . mometasone (ASMANEX 120 METERED DOSES) 220 MCG/INH inhaler Inhale 2 puffs into the lungs daily. 1 Inhaler 12  . montelukast (SINGULAIR) 10 MG tablet Take 10 mg by mouth at bedtime.    . ranitidine (ZANTAC) 150 MG tablet Take 150 mg by mouth 2 (two) times daily.      [name] Respiratory Therapy Supplies (FLUTTER) DEVI Use 3-4 times per day 1 each 0  . Rivaroxaban (XARELTO) 15 MG TABS tablet Take 1 tablet (15 mg total) by mouth daily with supper. 30 tablet 0  . Tamsulosin HCl (FLOMAX) 0.4 MG CAPS Take 0.4 mg by mouth daily after supper.     . temazepam (RESTORIL) 15 MG capsule Take 30 mg by mouth at bedtime.     . traMADol (ULTRAM) 50 MG tablet Take by mouth every 6 (six) hours as needed.     No current facility-administered medications for this visit.    Functional Status: In your present state of health, do you have any difficulty performing the following activities: 07/10/2014 12/08/2013  Hearing? Y N  Vision? N N  Difficulty concentrating or making decisions? N N  Walking or climbing stairs? Y Y  Dressing or bathing? N Y  Doing errands, shopping? N N  Preparing Food and eating ? N -  Using the Toilet? N -  In the past six months, have you accidently leaked urine? N -  Do you have problems with loss of bowel control? N -  Managing your Medications? N -  Managing your Finances? N -  Housekeeping or managing your Housekeeping? N -    Fall/Depression Screening: PHQ 2/9 Scores 07/10/2014  PHQ - 2 Score 0    Assessment: 1. Medication assistance: patient unable to afford BiDil. There is a patient assistance program for BiDil but the patient does not qualify as the only eligible patients are those without insurance. You can split BiDil into the different components (hydralazine and isosorbide dinitrate) for cost  savings but it increases the pill burden and there are no clinical trials to support the use of them separately compared to the combination pill.   Plan: 1. Medication assistance: I called the patient to discuss options and I had to leave a HIPAA compliant message for patient to return my phone call.  I will reach out on 07/12/14 if the patient does not return my call today.    Nicoletta Ba, PharmD, [place] Resident [place] [phone]

## 2014-07-11 NOTE — Patient Outreach (Signed)
Patient returned my call. He is unsure of the cost of the BiDil and says that it is his daughter who picks up his medications for him.  I told him that some patients will get the hydralazine and isosorbide dinitrate separately for cost savings but that there unfortunately are no programs to help reduce or cover the cost of BiDil.  He asked that I share this information with Dr. Tobie Poet. I will send her a fax with this information. I will also recommend to her that she consider an ACE inhibitor or ARB as patient has heart failure and diabetes but is not on one and there is no allergy listed. This might be a viable and affordable alternative to using BiDil.   Patient has my contact information and will have his daughter call me back if she has any questions.  Will close out of pharmacy program.   Nicoletta Ba, PharmD, [place] Resident [place] [phone]

## 2014-07-12 DIAGNOSIS — E1129 Type 2 diabetes mellitus with other diabetic kidney complication: Secondary | ICD-10-CM | POA: Diagnosis not present

## 2014-07-12 DIAGNOSIS — N179 Acute kidney failure, unspecified: Secondary | ICD-10-CM | POA: Diagnosis not present

## 2014-07-12 DIAGNOSIS — D638 Anemia in other chronic diseases classified elsewhere: Secondary | ICD-10-CM | POA: Diagnosis not present

## 2014-07-12 DIAGNOSIS — I129 Hypertensive chronic kidney disease with stage 1 through stage 4 chronic kidney disease, or unspecified chronic kidney disease: Secondary | ICD-10-CM | POA: Diagnosis not present

## 2014-07-12 DIAGNOSIS — N183 Chronic kidney disease, stage 3 (moderate): Secondary | ICD-10-CM | POA: Diagnosis not present

## 2014-07-14 DIAGNOSIS — M0589 Other rheumatoid arthritis with rheumatoid factor of multiple sites: Secondary | ICD-10-CM | POA: Diagnosis not present

## 2014-08-01 ENCOUNTER — Other Ambulatory Visit: Payer: Self-pay | Admitting: *Deleted

## 2014-08-01 NOTE — Patient Outreach (Signed)
[name]) Care Management   08/02/2014  [name] 1930/04/08 [id number]  [name] is an 81 y.o. male  Subjective: Patient reports having a good day, denies chest pain or shortness of breath. [name] reports that he weighs every morning, but only checks his blood sugar once a week. Patient reports taking his medication as ordered his daughter fill his medication box weekly.  Objective:   Review of Systems  Constitutional: Negative.   HENT: Negative.   Eyes: Negative.   Cardiovascular: Positive for leg swelling.       Slight lower leg swelling, left leg greater than right, per patient that is normal for him.  Genitourinary: Negative.   Musculoskeletal: Positive for joint pain.       Arthritis pain in back  Skin: Negative.   Neurological: Negative.   Endo/Heme/Allergies: Negative.   Psychiatric/Behavioral: Negative.     Physical Exam  Constitutional: He is oriented to person, place, and time. Vital signs are normal. He appears well-developed.  Cardiovascular: Normal rate, regular rhythm and normal heart sounds.   Respiratory: Breath sounds normal.  GI: Soft.  Neurological: He is alert and oriented to person, place, and time.  Skin: Skin is warm and dry.  Psychiatric: He has a normal mood and affect. Cognition and memory are normal.   BP 110/64 mmHg  Pulse 74  Resp 20  SpO2 97% Current Medications:   Current Outpatient Prescriptions  Medication Sig Dispense Refill  . Abatacept (ORENCIA IV) Inject into the vein every 30 (thirty) days.    [name] albuterol (PROVENTIL) (2.5 MG/3ML) 0.083% nebulizer solution Inhale 1 mL into the lungs every 6 (six) hours as needed.  3  . atorvastatin (LIPITOR) 10 MG tablet Take 1 tablet (10 mg total) by mouth daily at 6 PM. 30 tablet 0  . cetirizine (ZYRTEC) 10 MG tablet Take 10 mg by mouth daily.    [name] Sodium (DSS) 100 MG CAPS Take 100 mg by mouth 2 (two) times daily.    [name] ELIQUIS 2.5 MG TABS tablet Take 2.5 mg by  mouth 2 (two) times daily.  12  . finasteride (PROSCAR) 5 MG tablet Take 5 mg by mouth daily.    . folic acid (FOLVITE) 1 MG tablet Take 1 mg by mouth daily.      . furosemide (LASIX) 40 MG tablet Take 40 mg by mouth daily.    [name] gabapentin (NEURONTIN) 300 MG capsule Take 1 capsule (300 mg total) by mouth 2 (two) times daily. 15 capsule 0  . HYDROcodone-acetaminophen (NORCO) 5-325 MG per tablet Take 1 tablet by mouth every 6 (six) hours as needed for moderate pain. (Patient taking differently: Take 1 tablet by mouth daily. ) 30 tablet 0  . isosorbide-hydrALAZINE (BIDIL) 20-37.5 MG per tablet Take 1 tablet by mouth 3 (three) times daily. 90 tablet 0  . Linagliptin-Metformin HCl (JENTADUETO) 2.05-998 MG TABS Take 2.5-1,000 mg by mouth. Once every morning with a meal    . metoprolol tartrate (LOPRESSOR) 25 MG tablet Take 1 tablet (25 mg total) by mouth 3 (three) times daily. 90 tablet 0  . montelukast (SINGULAIR) 10 MG tablet Take 10 mg by mouth at bedtime.    . ranitidine (ZANTAC) 150 MG tablet Take 150 mg by mouth 2 (two) times daily.      . Tamsulosin HCl (FLOMAX) 0.4 MG CAPS Take 0.4 mg by mouth daily after supper.     . traZODone (DESYREL) 50 MG tablet Take 50 mg by  mouth at bedtime.    [name] amiodarone (PACERONE) 400 MG tablet Take 1 tablet (400 mg total) by mouth 2 (two) times daily. (Patient not taking: Reported on 08/01/2014) 60 tablet 0  . fluticasone (FLOVENT HFA) 110 MCG/ACT inhaler Inhale 2 puffs into the lungs 2 (two) times daily. (Patient not taking: Reported on 08/01/2014) 1 Inhaler 12  . linagliptin (TRADJENTA) 5 MG TABS tablet Take 1 tablet (5 mg total) by mouth daily. (Patient not taking: Reported on 08/01/2014) 30 tablet 0  . mometasone (ASMANEX 120 METERED DOSES) 220 MCG/INH inhaler Inhale 2 puffs into the lungs daily. (Patient not taking: Reported on 08/01/2014) 1 Inhaler 12  . Respiratory Therapy Supplies (FLUTTER) DEVI Use 3-4 times per day (Patient not taking: Reported on 08/01/2014) 1  each 0  . Rivaroxaban (XARELTO) 15 MG TABS tablet Take 1 tablet (15 mg total) by mouth daily with supper. (Patient not taking: Reported on 08/01/2014) 30 tablet 0  . temazepam (RESTORIL) 15 MG capsule Take 30 mg by mouth at bedtime.     . traMADol (ULTRAM) 50 MG tablet Take by mouth every 6 (six) hours as needed.     No current facility-administered medications for this visit.    Functional Status:   In your present state of health, do you have any difficulty performing the following activities: 07/10/2014 12/08/2013  Hearing? Y N  Vision? N N  Difficulty concentrating or making decisions? N N  Walking or climbing stairs? Y Y  Dressing or bathing? N Y  Doing errands, shopping? N N  Preparing Food and eating ? N -  Using the Toilet? N -  In the past six months, have you accidently leaked urine? N -  Do you have problems with loss of bowel control? N -  Managing your Medications? N -  Managing your Finances? N -  Housekeeping or managing your Housekeeping? N -    Fall/Depression Screening:    PHQ 2/9 Scores 07/10/2014  PHQ - 2 Score 0    Assessment:  [name] weighs each morning but does not record it. He has all the needed supplies to check his blood sugar daily but does not do that on a regular basis. Will benefit from sooner follow up from care manager to check progress of patient self monitoring of weights and blood sugar readings.  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   Plan: Will follow up with patient in 2 weeks regarding  progress related to checking his blood sugar and weighing on regular basis. [facility] CM Care Plan Problem One        Patient Outreach from 08/01/2014 in [place] Problem One  Heart Failure self management care   Care Plan for Problem One  Active   [facility] Long Term Goal  (31-90 days)  Patient will not be admitted with Heart Failure in the next 31 days   [facility] Long Term Goal Start Date  07/10/14   Interventions for Problem One Long Term Goal  Discussed the importance of notifying provider of increase sob,swelling, weight gain of 5 pounds in 1 week or 3 pounds in a day   [facility] CM Short Term Goal #1 (0-30 days)  Patient will weigh and record daily in the next 30 days   [facility] CM Short Term Goal #1 Start Date  08/01/14 [unmet goal date restarted]   Interventions for Short Term Goal #1  discussed importance of knowing daily weight to compare for weight gain   [facility] CM Short Term Goal #2 (0-30 days)  Patient will be able to state at least 2 yellow zone symptoms in the next 30 days   [facility] CM Short Term Goal #2 Start Date  08/01/14 [name] unmet restarted date]   Interventions for Short Term Goal #2  reviewed yellow zone symptoms    [facility] CM Care Plan Problem Two        Patient Outreach from 08/01/2014 in [place] Problem Two  Diabetes self Management   Care Plan for Problem Two  Active   Interventions for Problem Two Long Term Goal   Discussed with patient the importance of knowing blood sugar on a daily basis in relation to taking medication daily    [facility]N Long Term Goal (31-90) days  Patient will check blood sugar at least 3 days per week and record in the next 31 days   [facility]N Long Term Goal Start Date  08/01/14   [facility]N CM Short Term Goal #1 (0-30 days)  Patient will  report cuttting back on starchs and sweets in the next 30 days    [facility]N CM Short Term Goal #1 Start Date  08/01/14   Interventions for Short Term Goal #2   Reviewed carb foods handout and portion sizes       [name], RN, [place] Care Management [phone]

## 2014-08-02 ENCOUNTER — Encounter: Payer: Self-pay | Admitting: *Deleted

## 2014-08-07 DIAGNOSIS — I480 Paroxysmal atrial fibrillation: Secondary | ICD-10-CM | POA: Diagnosis not present

## 2014-08-07 DIAGNOSIS — I5022 Chronic systolic (congestive) heart failure: Secondary | ICD-10-CM | POA: Diagnosis not present

## 2014-08-07 DIAGNOSIS — N184 Chronic kidney disease, stage 4 (severe): Secondary | ICD-10-CM | POA: Diagnosis not present

## 2014-08-07 DIAGNOSIS — R0989 Other specified symptoms and signs involving the circulatory and respiratory systems: Secondary | ICD-10-CM | POA: Diagnosis not present

## 2014-08-10 DIAGNOSIS — M0589 Other rheumatoid arthritis with rheumatoid factor of multiple sites: Secondary | ICD-10-CM | POA: Diagnosis not present

## 2014-08-14 ENCOUNTER — Ambulatory Visit: Payer: PRIVATE HEALTH INSURANCE | Admitting: *Deleted

## 2014-08-14 ENCOUNTER — Other Ambulatory Visit: Payer: Self-pay | Admitting: *Deleted

## 2014-08-14 NOTE — Patient Outreach (Signed)
[facility]) Care Management  08/14/2014  NASHID PELLUM 08-16-1930 [id number]   Arrived at scheduled home visit at 1230 on today, greeted at the door by, Mrs. Kirk she stated that Mr.Son went to work on today. I left my business card with her. Will await return call from patient, if not  I will reach out to patient to reschedule visit.[name], RN, [place] Care Management [phone]

## 2014-08-18 ENCOUNTER — Inpatient Hospital Stay (HOSPITAL_COMMUNITY)
Admission: EM | Admit: 2014-08-18 | Discharge: 2014-08-22 | DRG: 872 | Disposition: A | Discharge status: Home / Self Care | Payer: Medicare Other | Attending: Internal Medicine | Admitting: Internal Medicine

## 2014-08-18 ENCOUNTER — Encounter (HOSPITAL_COMMUNITY): Payer: Self-pay | Admitting: Emergency Medicine

## 2014-08-18 ENCOUNTER — Emergency Department (HOSPITAL_COMMUNITY): Payer: Medicare Other

## 2014-08-18 DIAGNOSIS — R509 Fever, unspecified: Secondary | ICD-10-CM | POA: Diagnosis not present

## 2014-08-18 DIAGNOSIS — I272 Other secondary pulmonary hypertension: Secondary | ICD-10-CM | POA: Diagnosis present

## 2014-08-18 DIAGNOSIS — N184 Chronic kidney disease, stage 4 (severe): Secondary | ICD-10-CM | POA: Diagnosis present

## 2014-08-18 DIAGNOSIS — L03116 Cellulitis of left lower limb: Secondary | ICD-10-CM | POA: Diagnosis present

## 2014-08-18 DIAGNOSIS — M069 Rheumatoid arthritis, unspecified: Secondary | ICD-10-CM | POA: Diagnosis present

## 2014-08-18 DIAGNOSIS — J438 Other emphysema: Secondary | ICD-10-CM | POA: Diagnosis present

## 2014-08-18 DIAGNOSIS — Z79899 Other long term (current) drug therapy: Secondary | ICD-10-CM

## 2014-08-18 DIAGNOSIS — R0602 Shortness of breath: Secondary | ICD-10-CM | POA: Diagnosis not present

## 2014-08-18 DIAGNOSIS — E039 Hypothyroidism, unspecified: Secondary | ICD-10-CM | POA: Diagnosis present

## 2014-08-18 DIAGNOSIS — I5023 Acute on chronic systolic (congestive) heart failure: Secondary | ICD-10-CM | POA: Diagnosis present

## 2014-08-18 DIAGNOSIS — K219 Gastro-esophageal reflux disease without esophagitis: Secondary | ICD-10-CM | POA: Diagnosis present

## 2014-08-18 DIAGNOSIS — J479 Bronchiectasis, uncomplicated: Secondary | ICD-10-CM

## 2014-08-18 DIAGNOSIS — I129 Hypertensive chronic kidney disease with stage 1 through stage 4 chronic kidney disease, or unspecified chronic kidney disease: Secondary | ICD-10-CM | POA: Diagnosis present

## 2014-08-18 DIAGNOSIS — J45909 Unspecified asthma, uncomplicated: Secondary | ICD-10-CM | POA: Diagnosis present

## 2014-08-18 DIAGNOSIS — E1140 Type 2 diabetes mellitus with diabetic neuropathy, unspecified: Secondary | ICD-10-CM | POA: Diagnosis present

## 2014-08-18 DIAGNOSIS — A419 Sepsis, unspecified organism: Secondary | ICD-10-CM | POA: Diagnosis present

## 2014-08-18 DIAGNOSIS — K5900 Constipation, unspecified: Secondary | ICD-10-CM | POA: Diagnosis present

## 2014-08-18 DIAGNOSIS — E1122 Type 2 diabetes mellitus with diabetic chronic kidney disease: Secondary | ICD-10-CM | POA: Diagnosis present

## 2014-08-18 DIAGNOSIS — E0822 Diabetes mellitus due to underlying condition with diabetic chronic kidney disease: Secondary | ICD-10-CM

## 2014-08-18 DIAGNOSIS — R0902 Hypoxemia: Secondary | ICD-10-CM | POA: Diagnosis present

## 2014-08-18 DIAGNOSIS — J449 Chronic obstructive pulmonary disease, unspecified: Secondary | ICD-10-CM | POA: Diagnosis not present

## 2014-08-18 DIAGNOSIS — I5022 Chronic systolic (congestive) heart failure: Secondary | ICD-10-CM | POA: Diagnosis present

## 2014-08-18 DIAGNOSIS — E785 Hyperlipidemia, unspecified: Secondary | ICD-10-CM | POA: Diagnosis present

## 2014-08-18 DIAGNOSIS — Z87891 Personal history of nicotine dependence: Secondary | ICD-10-CM

## 2014-08-18 DIAGNOSIS — I480 Paroxysmal atrial fibrillation: Secondary | ICD-10-CM | POA: Diagnosis present

## 2014-08-18 DIAGNOSIS — Z79891 Long term (current) use of opiate analgesic: Secondary | ICD-10-CM

## 2014-08-18 DIAGNOSIS — D649 Anemia, unspecified: Secondary | ICD-10-CM | POA: Diagnosis present

## 2014-08-18 DIAGNOSIS — E038 Other specified hypothyroidism: Secondary | ICD-10-CM | POA: Diagnosis present

## 2014-08-18 DIAGNOSIS — H9190 Unspecified hearing loss, unspecified ear: Secondary | ICD-10-CM | POA: Diagnosis present

## 2014-08-18 DIAGNOSIS — G629 Polyneuropathy, unspecified: Secondary | ICD-10-CM | POA: Diagnosis present

## 2014-08-18 DIAGNOSIS — I270 Primary pulmonary hypertension: Secondary | ICD-10-CM | POA: Diagnosis not present

## 2014-08-18 DIAGNOSIS — E119 Type 2 diabetes mellitus without complications: Secondary | ICD-10-CM

## 2014-08-18 DIAGNOSIS — R110 Nausea: Secondary | ICD-10-CM | POA: Diagnosis present

## 2014-08-18 DIAGNOSIS — I4891 Unspecified atrial fibrillation: Secondary | ICD-10-CM | POA: Diagnosis present

## 2014-08-18 DIAGNOSIS — Z7901 Long term (current) use of anticoagulants: Secondary | ICD-10-CM

## 2014-08-18 DIAGNOSIS — R069 Unspecified abnormalities of breathing: Secondary | ICD-10-CM | POA: Diagnosis not present

## 2014-08-18 DIAGNOSIS — I10 Essential (primary) hypertension: Secondary | ICD-10-CM | POA: Diagnosis present

## 2014-08-18 DIAGNOSIS — M7989 Other specified soft tissue disorders: Secondary | ICD-10-CM | POA: Diagnosis not present

## 2014-08-18 DIAGNOSIS — M25572 Pain in left ankle and joints of left foot: Secondary | ICD-10-CM | POA: Diagnosis not present

## 2014-08-18 LAB — GLUCOSE, CAPILLARY
GLUCOSE-CAPILLARY: 173 mg/dL — AB (ref 65–99)
Glucose-Capillary: 216 mg/dL — ABNORMAL HIGH (ref 65–99)
Glucose-Capillary: 191 mg/dL — ABNORMAL HIGH (ref 65–99)

## 2014-08-18 LAB — CBC
HCT: 37.1 % — ABNORMAL LOW (ref 39.0–52.0)
Hemoglobin: 12.8 g/dL — ABNORMAL LOW (ref 13.0–17.0)
MCH: 30.1 pg (ref 26.0–34.0)
MCHC: 34.5 g/dL (ref 30.0–36.0)
MCV: 87.3 fL (ref 78.0–100.0)
Platelets: 180 10*3/uL (ref 150–400)
RBC: 4.25 MIL/uL (ref 4.22–5.81)
RDW: 13.3 % (ref 11.5–15.5)
WBC: 16.1 10*3/uL — AB (ref 4.0–10.5)

## 2014-08-18 LAB — COMPREHENSIVE METABOLIC PANEL
ALBUMIN: 3.2 g/dL — AB (ref 3.5–5.0)
ALT: 12 U/L — ABNORMAL LOW (ref 17–63)
ANION GAP: 8 (ref 5–15)
AST: 16 U/L (ref 15–41)
Alkaline Phosphatase: 64 U/L (ref 38–126)
BUN: 28 mg/dL — AB (ref 6–20)
CALCIUM: 8.8 mg/dL — AB (ref 8.9–10.3)
CO2: 29 mmol/L (ref 22–32)
CREATININE: 2.14 mg/dL — AB (ref 0.61–1.24)
Chloride: 99 mmol/L — ABNORMAL LOW (ref 101–111)
GFR calc Af Amer: 32 mL/min — ABNORMAL LOW (ref >60)
GFR, EST NON AFRICAN AMERICAN: 27 mL/min — AB (ref >60)
Glucose, Bld: 181 mg/dL — ABNORMAL HIGH (ref 65–99)
Potassium: 3.6 mmol/L (ref 3.5–5.1)
Sodium: 136 mmol/L (ref 135–145)
Total Bilirubin: 0.8 mg/dL (ref 0.3–1.2)
Total Protein: 5.8 g/dL — ABNORMAL LOW (ref 6.5–8.1)

## 2014-08-18 LAB — I-STAT CG4 LACTIC ACID, ED
Lactic Acid, Venous: 1.06 mmol/L (ref 0.5–2.0)
Lactic Acid, Venous: 0.64 mmol/L (ref 0.5–2.0)
Lactic Acid, Venous: 0.54 mmol/L (ref 0.5–2.0)

## 2014-08-18 LAB — URINE MICROSCOPIC-ADD ON

## 2014-08-18 LAB — URINALYSIS, ROUTINE W REFLEX MICROSCOPIC
BILIRUBIN URINE: NEGATIVE
GLUCOSE, UA: NEGATIVE mg/dL
Hgb urine dipstick: NEGATIVE
Ketones, ur: NEGATIVE mg/dL
LEUKOCYTES UA: NEGATIVE
NITRITE: NEGATIVE
PROTEIN: 30 mg/dL — AB
SPECIFIC GRAVITY, URINE: 1.016 (ref 1.005–1.030)
UROBILINOGEN UA: 0.2 mg/dL (ref 0.0–1.0)
pH: 5.5 (ref 5.0–8.0)

## 2014-08-18 LAB — PROTIME-INR
INR: 1.25 (ref 0.00–1.49)
Prothrombin Time: 15.8 seconds — ABNORMAL HIGH (ref 11.6–15.2)

## 2014-08-18 LAB — BRAIN NATRIURETIC PEPTIDE: B Natriuretic Peptide: 563.2 pg/mL — ABNORMAL HIGH (ref 0.0–100.0)

## 2014-08-18 MED ORDER — METOPROLOL TARTRATE 25 MG PO TABS
25.0000 mg | ORAL_TABLET | Freq: Three times a day (TID) | ORAL | Status: DC
  Administered 2014-08-18 – 2014-08-22 (×11): 25 mg via ORAL
  Filled 2014-08-18 (×13): qty 1

## 2014-08-18 MED ORDER — VANCOMYCIN HCL IN DEXTROSE 1-5 GM/200ML-% IV SOLN
1000.0000 mg | INTRAVENOUS | Status: DC
  Administered 2014-08-19 – 2014-08-20 (×2): 1000 mg via INTRAVENOUS
  Filled 2014-08-18 (×2): qty 200

## 2014-08-18 MED ORDER — DSS 100 MG PO CAPS: 100.0000 mg | ORAL_CAPSULE | Freq: Two times a day (BID) | ORAL | Status: DC

## 2014-08-18 MED ORDER — TRAZODONE HCL 50 MG PO TABS
50.0000 mg | ORAL_TABLET | Freq: Every day | ORAL | Status: DC
  Administered 2014-08-18 – 2014-08-21 (×4): 50 mg via ORAL
  Filled 2014-08-18 (×5): qty 1

## 2014-08-18 MED ORDER — VANCOMYCIN HCL IN DEXTROSE 1-5 GM/200ML-% IV SOLN: 1000.0000 mg | Freq: Two times a day (BID) | INTRAVENOUS | Status: DC

## 2014-08-18 MED ORDER — PIPERACILLIN-TAZOBACTAM 3.375 G IVPB 30 MIN
3.3750 g | Freq: Once | INTRAVENOUS | Status: AC
  Administered 2014-08-18: 3.375 g via INTRAVENOUS
  Filled 2014-08-18: qty 50

## 2014-08-18 MED ORDER — INSULIN ASPART 100 UNIT/ML ~~LOC~~ SOLN
0.0000 [IU] | Freq: Three times a day (TID) | SUBCUTANEOUS | Status: DC
  Administered 2014-08-19 (×2): 2 [IU] via SUBCUTANEOUS
  Administered 2014-08-19: 1 [IU] via SUBCUTANEOUS
  Administered 2014-08-20 – 2014-08-21 (×5): 2 [IU] via SUBCUTANEOUS
  Administered 2014-08-21 (×2): 3 [IU] via SUBCUTANEOUS
  Administered 2014-08-22: 1 [IU] via SUBCUTANEOUS

## 2014-08-18 MED ORDER — APIXABAN 2.5 MG PO TABS
2.5000 mg | ORAL_TABLET | Freq: Two times a day (BID) | ORAL | Status: DC
  Administered 2014-08-18 – 2014-08-22 (×8): 2.5 mg via ORAL
  Filled 2014-08-18 (×9): qty 1

## 2014-08-18 MED ORDER — GABAPENTIN 300 MG PO CAPS
300.0000 mg | ORAL_CAPSULE | Freq: Two times a day (BID) | ORAL | Status: DC
Start: 2014-08-18 — End: 2014-08-22
  Administered 2014-08-18 – 2014-08-22 (×8): 300 mg via ORAL
  Filled 2014-08-18 (×11): qty 1

## 2014-08-18 MED ORDER — SODIUM CHLORIDE 0.9 % IV BOLUS (SEPSIS)
1000.0000 mL | Freq: Once | INTRAVENOUS | Status: AC
  Administered 2014-08-18: 1000 mL via INTRAVENOUS

## 2014-08-18 MED ORDER — ALBUTEROL SULFATE (2.5 MG/3ML) 0.083% IN NEBU: 3.0000 mL | INHALATION_SOLUTION | Freq: Four times a day (QID) | RESPIRATORY_TRACT | Status: DC | PRN

## 2014-08-18 MED ORDER — FUROSEMIDE 40 MG PO TABS
40.0000 mg | ORAL_TABLET | Freq: Every day | ORAL | Status: DC
  Administered 2014-08-19: 40 mg via ORAL
  Filled 2014-08-18: qty 1

## 2014-08-18 MED ORDER — PIPERACILLIN-TAZOBACTAM 3.375 G IVPB
3.3750 g | Freq: Three times a day (TID) | INTRAVENOUS | Status: DC
Start: 2014-08-18 — End: 2014-08-22
  Administered 2014-08-18 – 2014-08-22 (×11): 3.375 g via INTRAVENOUS
  Filled 2014-08-18 (×13): qty 50

## 2014-08-18 MED ORDER — SODIUM CHLORIDE 0.9 % IJ SOLN
3.0000 mL | Freq: Two times a day (BID) | INTRAMUSCULAR | Status: DC
  Administered 2014-08-18 – 2014-08-22 (×8): 3 mL via INTRAVENOUS

## 2014-08-18 MED ORDER — BUDESONIDE 0.5 MG/2ML IN SUSP
0.5000 mg | Freq: Two times a day (BID) | RESPIRATORY_TRACT | Status: DC
  Administered 2014-08-18 – 2014-08-22 (×8): 0.5 mg via RESPIRATORY_TRACT
  Filled 2014-08-18 (×10): qty 2

## 2014-08-18 MED ORDER — ATORVASTATIN CALCIUM 10 MG PO TABS
10.0000 mg | ORAL_TABLET | Freq: Every day | ORAL | Status: DC
  Administered 2014-08-18 – 2014-08-21 (×4): 10 mg via ORAL
  Filled 2014-08-18 (×5): qty 1

## 2014-08-18 MED ORDER — FAMOTIDINE 10 MG PO TABS
10.0000 mg | ORAL_TABLET | Freq: Two times a day (BID) | ORAL | Status: DC
  Administered 2014-08-18 – 2014-08-22 (×8): 10 mg via ORAL
  Filled 2014-08-18 (×9): qty 1

## 2014-08-18 MED ORDER — TAMSULOSIN HCL 0.4 MG PO CAPS
0.4000 mg | ORAL_CAPSULE | Freq: Every day | ORAL | Status: DC
  Administered 2014-08-18 – 2014-08-21 (×4): 0.4 mg via ORAL
  Filled 2014-08-18 (×5): qty 1

## 2014-08-18 MED ORDER — SODIUM CHLORIDE 0.9 % IV SOLN
1750.0000 mg | Freq: Once | INTRAVENOUS | Status: AC
  Administered 2014-08-18: 1750 mg via INTRAVENOUS
  Filled 2014-08-18: qty 1750

## 2014-08-18 MED ORDER — HYDROCODONE-ACETAMINOPHEN 5-325 MG PO TABS: 1.0000 | ORAL_TABLET | Freq: Four times a day (QID) | ORAL | Status: DC | PRN

## 2014-08-18 MED ORDER — LEVOTHYROXINE SODIUM 25 MCG PO TABS
25.0000 ug | ORAL_TABLET | Freq: Every day | ORAL | Status: DC
  Administered 2014-08-19 – 2014-08-22 (×4): 25 ug via ORAL
  Filled 2014-08-18 (×5): qty 1

## 2014-08-18 MED ORDER — MOMETASONE FUROATE 220 MCG/INH IN AEPB: 2.0000 | INHALATION_SPRAY | Freq: Every day | RESPIRATORY_TRACT | Status: DC

## 2014-08-18 MED ORDER — VANCOMYCIN HCL IN DEXTROSE 1-5 GM/200ML-% IV SOLN: 1000.0000 mg | Freq: Once | INTRAVENOUS | Status: DC

## 2014-08-18 NOTE — ED Notes (Signed)
Spoke with Dr. Thomasene Lot, only one bolus at this time.

## 2014-08-18 NOTE — Progress Notes (Signed)
ANTIBIOTIC CONSULT NOTE - INITIAL  Pharmacy Consult for vancomycin, zosyn  Indication: rule out sepsis  No Known Allergies  Patient Measurements:   Adjusted Body Weight:   Vital Signs: Temp: 99.9 F (37.7 C) (08/05 1302) Temp Source: Oral (08/05 1302) BP: 115/47 mmHg (08/05 1400) Pulse Rate: 95 (08/05 1400) Intake/Output from previous day:   Intake/Output from this shift:    Labs:  Recent Labs  08/18/14 1334  WBC 16.1*  HGB 12.8*  PLT 180  CREATININE 2.14*   CrCl cannot be calculated (Unknown ideal weight.). No results for input(s): VANCOTROUGH, VANCOPEAK, VANCORANDOM, GENTTROUGH, GENTPEAK, GENTRANDOM, TOBRATROUGH, TOBRAPEAK, TOBRARND, AMIKACINPEAK, AMIKACINTROU, AMIKACIN in the last 72 hours.   Microbiology: No results found for this or any previous visit (from the past 720 hour(s)).  Medical History: Past Medical History  Diagnosis Date  . Anemia   . Hyperlipidemia     takes Pravastatin daily  . Hypertension     takes Cardizem,Proscar,and Lisinopril daily  . Peripheral neuropathy   . Joint pain   . Joint swelling   . GERD (gastroesophageal reflux disease)     takes Zantac daily   . Constipation     takes stool softener daily  . History of blood transfusion     "related to one of my ORs"  . Impaired hearing     wears hearing aids  . Type II diabetes mellitus     takes Metformin daily  . Rheumatoid arthritis     "all over; take orencia  q 4 wks" (02/01/2013)  . Atrial fibrillation 12/08/2013  . Cataract   . CHF (congestive heart failure)   . Chronic kidney disease     Medications:  Anti-infectives    Start     Dose/Rate Route Frequency Ordered Stop   08/18/14 1500  vancomycin (VANCOCIN) 1,750 mg in sodium chloride 0.9 % 500 mL IVPB     1,750 mg 250 mL/hr over 120 Minutes Intravenous  Once 08/18/14 1445     08/18/14 1445  piperacillin-tazobactam (ZOSYN) IVPB 3.375 g     3.375 g 100 mL/hr over 30 Minutes Intravenous  Once 08/18/14 1436     08/18/14 1445  vancomycin (VANCOCIN) IVPB 1000 mg/200 mL premix  Status:  Discontinued     1,000 mg 200 mL/hr over 60 Minutes Intravenous  Once 08/18/14 1436 08/18/14 1445     Assessment: 81 yo male admitted after a couple days of not feeling well, has a hx of COPD, AFib amongst other comorbidities.  Reportedly febrile PTA, WBC 16.1, LA wnl, eCrCl ~ 27 ml/min.   Goal of Therapy:  Vancomycin trough level 15-20 mcg/ml   Plan:  Vancomycin 1750 mg IV x1 then 1g/24h Zosyn 3.375 g IV q8h Monitor renal fx, VT as needed, cultures, duration of therapy   Hughes Better, PharmD, BCPS Clinical Pharmacist Pager: [phone] 08/18/2014 2:53 PM

## 2014-08-18 NOTE — ED Notes (Signed)
Per  ems, pt from home. Pt feeling sick since last night. Hx of COPD and wheezing, worsening today. Possibly pneumonia. Pt had fever of 103.4, given ibuprofen 800mg  and zofran 4mg  iv by EMS. Pt also c/o temors, but no tremors upon arrival to ems. Pt 74% on RA, given breathing treatment and placed on 3L [place] and 94%. Pt has extensive hx, hx of COPD, wheezing, supposed to have lung removed but pt refusing. EKG shows sinus rhythm. Pt in NAD. Pt denies pain.

## 2014-08-18 NOTE — H&P (Addendum)
History and Physical  [name] UXY:[id number] DOB: Dec 21, 1930 DOA: 08/18/2014   Referring physician: Dr. Thomasene Lot  PCP: [name], MD  Specialists: none   Chief Complaint: fever and chills   HPI: [name] is a 81 y.o. male has a past medical history significant for chronic systolic heart failure, hypertension, hyperlipidemia, atrial fibrillation, type 2 diabetes mellitus, rheumatoid arthritis, presents to the emergency room with a chief complaint of fever and chills. Has been having fever and rigors since yesterday, felt a little better last night, however this morning started feeling bad again. He called EMS, and when they arrived his temperature was 103, was hypoxic, and he was brought to the emergency room. Patient denies any cough or breathing difficulties, he denies any chest pain, he denies any palpitations. He endorses wheezing for the past couple days. He denies any abdominal pain, no nausea, vomiting or diarrhea. He noticed an area on the left foot at the overlying skin is turning red and he has noticed swelling in his left ankle. He denies any pain in that area or any difficulties with ambulation. Has no headache, denies any lightheadedness. No sick contacts. Denies upper respiratory symptoms, denies sore throat/sinus pressure.   In the emergency room, patient had a temp of 99.9, is mildly tachycardic to 100, his blood pressure is normal and he is satting well on room air. He has a leukocytosis of 16, And his creatinine is elevated to 2.1. His urinalysis is negative for evidence of infection, his chest x-ray is without acute findings, his lactic acid was normal at 1.0. Given somewhat of a septic picture, blood cultures were obtained, he was given vancomycin and Zosyn and TRH was asked for admission.   Review of Systems:  as per history of present illness, otherwise 10 point review of systems negative   Past Medical History  Diagnosis Date  . Anemia   . Hyperlipidemia     takes  Pravastatin daily  . Hypertension     takes Cardizem,Proscar,and Lisinopril daily  . Peripheral neuropathy   . Joint pain   . Joint swelling   . GERD (gastroesophageal reflux disease)     takes Zantac daily   . Constipation     takes stool softener daily  . History of blood transfusion     "related to one of my ORs"  . Impaired hearing     wears hearing aids  . Type II diabetes mellitus     takes Metformin daily  . Rheumatoid arthritis     "all over; take orencia  q 4 wks" (02/01/2013)  . Atrial fibrillation 12/08/2013  . Cataract   . CHF (congestive heart failure)   . Chronic kidney disease    Past Surgical History  Procedure Laterality Date  . Total shoulder replacement Left 03/2010  . Shoulder arthroscopy w/ rotator cuff repair Right 1990's  . Repair tendons foot Left 1946; 1984    "farming accident; repaired tendons both times"  . Cataract extraction w/ intraocular lens  implant, bilateral Bilateral 1990's  . Total knee arthroplasty Right 12/16/2010    Procedure: TOTAL KNEE ARTHROPLASTY; rt Surgeon: Rudean Haskell, MD;  Location: [place];  Service: Orthopedics;  Laterality: Right;  . Ankle fusion Left 2014  . Tonsillectomy  1930's  . Colonoscopy    . Thyroidectomy, partial Right 02/01/2013  . Carpal tunnel release Bilateral 1990's  . Biopsy thyroid  2014  . Thyroidectomy Right 02/01/2013    Procedure: RIGHT THYROIDECTOMY LOBECTOMY;  Surgeon:  Earnstine Regal, MD;  Location: [place];  Service: General;  Laterality: Right;  . Foreign body removal Right 09/06/2013    Procedure: REMOVAL FOREIGN BODY RIGHT INDEX FINGER;  Surgeon: Daryll Brod, MD;  Location: [place];  Service: Orthopedics;  Laterality: Right;  ANESTHESIA: IV REGIONAL FAB   Social History:  reports that he quit smoking about 48 years ago. His smoking use included Cigarettes. He has a 15 pack-year smoking history. He has never used smokeless tobacco. He reports that he does not drink alcohol or use illicit  drugs.  No Known Allergies  Family History  Problem Relation Age of Onset  . Cancer Mother     breast  . Aneurysm Father    Prior to Admission medications   Medication Sig Start Date End Date Taking? Authorizing Provider  Abatacept (ORENCIA IV) Inject into the vein every 30 (thirty) days.    Historical Provider, MD  albuterol (PROVENTIL) (2.5 MG/3ML) 0.083% nebulizer solution Inhale 1 mL into the lungs every 6 (six) hours as needed. 11/21/13   Historical Provider, MD  amiodarone (PACERONE) 400 MG tablet Take 1 tablet (400 mg total) by mouth 2 (two) times daily. Patient not taking: Reported on 08/01/2014 12/13/13   Milagros Loll, MD  atorvastatin (LIPITOR) 10 MG tablet Take 1 tablet (10 mg total) by mouth daily at 6 PM. 12/13/13   Milagros Loll, MD  cetirizine (ZYRTEC) 10 MG tablet Take 10 mg by mouth daily.    Historical Provider, MD  Docusate Sodium (DSS) 100 MG CAPS Take 100 mg by mouth 2 (two) times daily. 10/03/11   Wylene Simmer, MD  ELIQUIS 2.5 MG TABS tablet Take 2.5 mg by mouth 2 (two) times daily. 01/22/14   Historical Provider, MD  finasteride (PROSCAR) 5 MG tablet Take 5 mg by mouth daily.    Historical Provider, MD  fluticasone (FLOVENT HFA) 110 MCG/ACT inhaler Inhale 2 puffs into the lungs 2 (two) times daily. Patient not taking: Reported on 08/01/2014 02/28/14   Elsie Stain, MD  folic acid (FOLVITE) 1 MG tablet Take 1 mg by mouth daily.      Historical Provider, MD  furosemide (LASIX) 40 MG tablet Take 40 mg by mouth daily.    Historical Provider, MD  gabapentin (NEURONTIN) 300 MG capsule Take 1 capsule (300 mg total) by mouth 2 (two) times daily. 12/13/13   Milagros Loll, MD  HYDROcodone-acetaminophen (NORCO) 5-325 MG per tablet Take 1 tablet by mouth every 6 (six) hours as needed for moderate pain. Patient taking differently: Take 1 tablet by mouth daily.  09/06/13   Daryll Brod, MD  isosorbide-hydrALAZINE (BIDIL) 20-37.5 MG per tablet Take 1 tablet by mouth 3 (three)  times daily. 12/13/13   Milagros Loll, MD  linagliptin (TRADJENTA) 5 MG TABS tablet Take 1 tablet (5 mg total) by mouth daily. Patient not taking: Reported on 08/01/2014 12/13/13   Milagros Loll, MD  Linagliptin-Metformin HCl (JENTADUETO) 2.05-998 MG TABS Take 2.5-1,000 mg by mouth. Once every morning with a meal    Historical Provider, MD  metoprolol tartrate (LOPRESSOR) 25 MG tablet Take 1 tablet (25 mg total) by mouth 3 (three) times daily. 12/13/13   Milagros Loll, MD  mometasone (ASMANEX 120 METERED DOSES) 220 MCG/INH inhaler Inhale 2 puffs into the lungs daily. Patient not taking: Reported on 08/01/2014 12/28/13   Elsie Stain, MD  montelukast (SINGULAIR) 10 MG tablet Take 10 mg by mouth at bedtime.  Historical Provider, MD  ranitidine (ZANTAC) 150 MG tablet Take 150 mg by mouth 2 (two) times daily.      Historical Provider, MD  Respiratory Therapy Supplies (FLUTTER) DEVI Use 3-4 times per day Patient not taking: Reported on 08/01/2014 12/28/13   Elsie Stain, MD  Rivaroxaban (XARELTO) 15 MG TABS tablet Take 1 tablet (15 mg total) by mouth daily with supper. Patient not taking: Reported on 08/01/2014 12/13/13   Milagros Loll, MD  Tamsulosin HCl (FLOMAX) 0.4 MG CAPS Take 0.4 mg by mouth daily after supper.     Historical Provider, MD  temazepam (RESTORIL) 15 MG capsule Take 30 mg by mouth at bedtime.     Historical Provider, MD  traMADol (ULTRAM) 50 MG tablet Take by mouth every 6 (six) hours as needed.    Historical Provider, MD  traZODone (DESYREL) 50 MG tablet Take 50 mg by mouth at bedtime.    Historical Provider, MD   Physical Exam: Filed Vitals:   08/18/14 1545 08/18/14 1600 08/18/14 1615 08/18/14 1630  BP: 113/49 114/57 110/58 116/59  Pulse: 87 83 85 84  Temp:      TempSrc:      Resp: 17 16 19 20   Weight:      SpO2: 98% 97% 97% 95%    GENERAL: NAD  HEENT: Head is normocephalic and atraumatic. Extraocular muscles are intact. Pupils are equal, round, and  reactive to light and accommodation. Nares appeared normal. Mouth is well hydrated and without lesions. Mucous membranes are moist. Posterior pharynx clear of any exudate or lesions.  NECK: Supple. No carotid bruits. No lymphadenopathy or thyromegaly.  LUNGS: Clear to auscultation. Scant wheezing at the bases  HEART: Regular rate and rhythm without murmur. 2+ pulses, no JVD, trace LE edema  ABDOMEN: Soft, nontender, and nondistended. Positive bowel sounds. No hepatosplenomegaly was noted.  EXTREMITIES: Without any cyanosis, clubbing, rash, lesions or edema.  NEUROLOGIC: He is alert and oriented x3. Cranial nerves II through XII are grossly intact. Strength 5/5 in all 4.  PSYCHIATRIC: Normal mood and affect  SKIN: left foot with mild erythema, warm, non tender to palpation. Margins drawn  Labs on Admission:  Basic Metabolic Panel:  Recent Labs Lab 08/18/14 1334  NA 136  K 3.6  CL 99*  CO2 29  GLUCOSE 181*  BUN 28*  CREATININE 2.14*  CALCIUM 8.8*   Liver Function Tests:  Recent Labs Lab 08/18/14 1334  AST 16  ALT 12*  ALKPHOS 64  BILITOT 0.8  PROT 5.8*  ALBUMIN 3.2*   CBC:  Recent Labs Lab 08/18/14 1334  WBC 16.1*  HGB 12.8*  HCT 37.1*  MCV 87.3  PLT 180   ProBNP (last 3 results)  Recent Labs  12/11/13 0320 12/12/13 0435 12/13/13 0320  PROBNP 2480.0* 3116.0* 3799.0*   Radiological Exams on Admission: Dg Chest 2 View  08/18/2014   CLINICAL DATA:  Shortness of breath and wheezing.  EXAM: CHEST - 2 VIEW  COMPARISON:  CT chest without contrast 12/07/2013.  FINDINGS: The heart size is exaggerated by low lung volumes. Pulmonary interstitium is also exaggerate by low lung volumes. No focal airspace disease is present. There is no edema or effusion. A left shoulder hemiarthroplasty is noted.  IMPRESSION: 1. Low lung volumes. 2. No acute cardiopulmonary disease.   Electronically Signed   By: San Morelle M.D.   On: 08/18/2014 14:29   Dg Ankle Complete  Left  08/18/2014   CLINICAL DATA:  Patient with swelling, pain  and warmth to the left ankle. Worsening for several days. Initial encounter.  EXAM: LEFT ANKLE COMPLETE - 3+ VIEW  COMPARISON:  Radiograph 11/30/2011  FINDINGS: Re- demonstrated postoperative changes of arthrodesis at the tibiotalar joint. Status post resection of the distal fibula. No significant or obvious perihardware lucency. Soft tissue swelling about the ankle joint. No evidence for acute fracture. Hindfoot degenerative changes. Midfoot degenerative changes. Soft tissue calcifications.  IMPRESSION: Grossly stable appearing postoperative appearance of prior tibiotalar joint arthrodesis and distal fibular resection.  Soft tissue swelling.   Electronically Signed   By: Lovey Newcomer M.D.   On: 08/18/2014 14:32    EKG: Independently reviewed. Sinus rhythm, poor tracing though  Assessment/Plan Principal Problem:   Sepsis Active Problems:   Diabetes   HLD (hyperlipidemia)   Rheumatoid arthritis   Essential hypertension   Anemia   Chronic obstructive airway disease with asthma   Atrial fibrillation   Fever   CKD (chronic kidney disease) stage 4, GFR 15-29 ml/min   Acute on chronic systolic heart failure   Sepsis  - patient with elevated fever to 103, tachycardia, elevated WBC and potential LLE cellulitis vs other source - started empirically on Vancomycin and Zosyn, will continue - blood and urine cultures obtained in the ED - lactic acid is normal  - has received saline bolus in the ED, will not place on standing fluids given systolic CHF and normal lactic acid  LLE cellulitis - very mild erythematous changes on his left foot, margins drawn - currently it does not look very convincing to me as the cause for his septic picture unless it is very early.  CKD stage IV - his Cr is at baseline at least for the past 1-2 years, ~2 - closely monitor  Acute on chronic systolic CHF - patient with a component of trace LE edema,  scattered wheezing  - most recent 2D echo November 2015 with EF 35-40%. Obtain BNP, if elevated will repeat 2D echo - resume po Lasix in am - daily weights, strict I&Os  A fib - Amiodarone is on his Medication list, however he has a bag with all of his prescriptions in the hospital room, I could not find a bottle for Amiodarone found. He is not sure whether he takes that or not. I have asked pharmacy tech to clarify. - for now continue Eliquis and Metoprolol - current EKG appears sinus, is regular, however plenty of artifacts. Will repeat.  DM - hold home medications - obtain A1C  COPD / bronchiectasis  - continue home nebulizer / medications - has mild wheezing but probably chronic, no significant dyspnea, moves air well, no cough, doubt acute exacerbation- SSI  Hypothyroidism - patient has synthroid in his medications bag however it does not appear in our records - will check TSH, continue home dose of synthroif  HTN - hold Bidil for now in the setting of #1  RA - outpatient management   Diet: carb modified, heart healthy Fluids: none DVT Prophylaxis: Eliquis  Code Status: Full  Family Communication: d/w wife, daughter bedside  Disposition Plan: admit to telemetry    Time spent coordinating care: 70 minutes, greater than 50% of this time was spent in counseling, explanation of diagnosis, planning of further management, coordination of care. Previous discharge summaries within the past 12 months personally reviewed.   [name] M. Cruzita Lederer, MD [facility] Pager [phone]  If 7PM-7AM, please contact night-coverage [email or web address] Password [facility] 08/18/2014, 4:39 PM

## 2014-08-18 NOTE — ED Provider Notes (Signed)
CSN: [id number]     Arrival date & time 08/18/14  1253 History   First MD Initiated Contact with Patient 08/18/14 1303     Chief Complaint  Patient presents with  . Shortness of Breath  . Fever  . Nausea     (Consider location/radiation/quality/duration/timing/severity/associated sxs/prior Treatment) HPI   Patient is an 81 year old male with history of A. fib, rheumatoid, CHF, chronic kidney disease and diabetes. Patient is presenting today with 2 days of feeling unwell. Patient reports he felt "shaky". He has also had a cough for a number of years he supposes it might have gotten worse the last couple days. Positive sputum. Fever found by EMS up to 103. Found very wheezy by EMS given breathing treatment en route to hospital.  Past Medical History  Diagnosis Date  . Anemia   . Hyperlipidemia     takes Pravastatin daily  . Hypertension     takes Cardizem,Proscar,and Lisinopril daily  . Peripheral neuropathy   . Joint pain   . Joint swelling   . GERD (gastroesophageal reflux disease)     takes Zantac daily   . Constipation     takes stool softener daily  . History of blood transfusion     "related to one of my ORs"  . Impaired hearing     wears hearing aids  . Type II diabetes mellitus     takes Metformin daily  . Rheumatoid arthritis     "all over; take orencia  q 4 wks" (02/01/2013)  . Atrial fibrillation 12/08/2013  . Cataract   . CHF (congestive heart failure)   . Chronic kidney disease    Past Surgical History  Procedure Laterality Date  . Total shoulder replacement Left 03/2010  . Shoulder arthroscopy w/ rotator cuff repair Right 1990's  . Repair tendons foot Left 1946; 1984    "farming accident; repaired tendons both times"  . Cataract extraction w/ intraocular lens  implant, bilateral Bilateral 1990's  . Total knee arthroplasty Right 12/16/2010    Procedure: TOTAL KNEE ARTHROPLASTY; rt Surgeon: Rudean Haskell, MD;  Location: [place];  Service: Orthopedics;   Laterality: Right;  . Ankle fusion Left 2014  . Tonsillectomy  1930's  . Colonoscopy    . Thyroidectomy, partial Right 02/01/2013  . Carpal tunnel release Bilateral 1990's  . Biopsy thyroid  2014  . Thyroidectomy Right 02/01/2013    Procedure: RIGHT THYROIDECTOMY LOBECTOMY;  Surgeon: Earnstine Regal, MD;  Location: [place];  Service: General;  Laterality: Right;  . Foreign body removal Right 09/06/2013    Procedure: REMOVAL FOREIGN BODY RIGHT INDEX FINGER;  Surgeon: Daryll Brod, MD;  Location: [place];  Service: Orthopedics;  Laterality: Right;  ANESTHESIA: IV REGIONAL FAB   Family History  Problem Relation Age of Onset  . Cancer Mother     breast  . Aneurysm Father    History  Substance Use Topics  . Smoking status: Former Smoker -- 1.00 packs/day for 15 years    Types: Cigarettes    Quit date: 09/01/1965  . Smokeless tobacco: Never Used     Comment: quit smoking in 1967  . Alcohol Use: No     Comment: 02/01/2013 "stopped drinking in 1967; about an alcoholic when I quit"    Review of Systems  Constitutional: Positive for fever, appetite change and fatigue. Negative for activity change.  HENT: Negative for drooling and hearing loss.   Eyes: Negative for discharge and redness.  Respiratory: Positive for cough  and shortness of breath.   Cardiovascular: Negative for chest pain.  Gastrointestinal: Negative for abdominal pain.  Genitourinary: Negative for dysuria and urgency.  Musculoskeletal: Positive for joint swelling. Negative for arthralgias.  Allergic/Immunologic: Negative for immunocompromised state.  Neurological: Negative for seizures and speech difficulty.  Psychiatric/Behavioral: Negative for behavioral problems and agitation.  All other systems reviewed and are negative.     Allergies  Review of patient's allergies indicates no known allergies.  Home Medications   Prior to Admission medications   Medication Sig Start Date End Date Taking?  Authorizing Provider  Abatacept (ORENCIA IV) Inject into the vein every 30 (thirty) days.    Historical Provider, MD  albuterol (PROVENTIL) (2.5 MG/3ML) 0.083% nebulizer solution Inhale 1 mL into the lungs every 6 (six) hours as needed. 11/21/13   Historical Provider, MD  amiodarone (PACERONE) 400 MG tablet Take 1 tablet (400 mg total) by mouth 2 (two) times daily. Patient not taking: Reported on 08/01/2014 12/13/13   Milagros Loll, MD  atorvastatin (LIPITOR) 10 MG tablet Take 1 tablet (10 mg total) by mouth daily at 6 PM. 12/13/13   Milagros Loll, MD  cetirizine (ZYRTEC) 10 MG tablet Take 10 mg by mouth daily.    Historical Provider, MD  Docusate Sodium (DSS) 100 MG CAPS Take 100 mg by mouth 2 (two) times daily. 10/03/11   Wylene Simmer, MD  ELIQUIS 2.5 MG TABS tablet Take 2.5 mg by mouth 2 (two) times daily. 01/22/14   Historical Provider, MD  finasteride (PROSCAR) 5 MG tablet Take 5 mg by mouth daily.    Historical Provider, MD  fluticasone (FLOVENT HFA) 110 MCG/ACT inhaler Inhale 2 puffs into the lungs 2 (two) times daily. Patient not taking: Reported on 08/01/2014 02/28/14   Elsie Stain, MD  folic acid (FOLVITE) 1 MG tablet Take 1 mg by mouth daily.      Historical Provider, MD  furosemide (LASIX) 40 MG tablet Take 40 mg by mouth daily.    Historical Provider, MD  gabapentin (NEURONTIN) 300 MG capsule Take 1 capsule (300 mg total) by mouth 2 (two) times daily. 12/13/13   Milagros Loll, MD  HYDROcodone-acetaminophen (NORCO) 5-325 MG per tablet Take 1 tablet by mouth every 6 (six) hours as needed for moderate pain. Patient taking differently: Take 1 tablet by mouth daily.  09/06/13   Daryll Brod, MD  isosorbide-hydrALAZINE (BIDIL) 20-37.5 MG per tablet Take 1 tablet by mouth 3 (three) times daily. 12/13/13   Milagros Loll, MD  linagliptin (TRADJENTA) 5 MG TABS tablet Take 1 tablet (5 mg total) by mouth daily. Patient not taking: Reported on 08/01/2014 12/13/13   Milagros Loll, MD   Linagliptin-Metformin HCl (JENTADUETO) 2.05-998 MG TABS Take 2.5-1,000 mg by mouth. Once every morning with a meal    Historical Provider, MD  metoprolol tartrate (LOPRESSOR) 25 MG tablet Take 1 tablet (25 mg total) by mouth 3 (three) times daily. 12/13/13   Milagros Loll, MD  mometasone (ASMANEX 120 METERED DOSES) 220 MCG/INH inhaler Inhale 2 puffs into the lungs daily. Patient not taking: Reported on 08/01/2014 12/28/13   Elsie Stain, MD  montelukast (SINGULAIR) 10 MG tablet Take 10 mg by mouth at bedtime.    Historical Provider, MD  ranitidine (ZANTAC) 150 MG tablet Take 150 mg by mouth 2 (two) times daily.      Historical Provider, MD  Respiratory Therapy Supplies (FLUTTER) DEVI Use 3-4 times per day Patient not taking: Reported on 08/01/2014 12/28/13  Elsie Stain, MD  Rivaroxaban (XARELTO) 15 MG TABS tablet Take 1 tablet (15 mg total) by mouth daily with supper. Patient not taking: Reported on 08/01/2014 12/13/13   Milagros Loll, MD  Tamsulosin HCl (FLOMAX) 0.4 MG CAPS Take 0.4 mg by mouth daily after supper.     Historical Provider, MD  temazepam (RESTORIL) 15 MG capsule Take 30 mg by mouth at bedtime.     Historical Provider, MD  traMADol (ULTRAM) 50 MG tablet Take by mouth every 6 (six) hours as needed.    Historical Provider, MD  traZODone (DESYREL) 50 MG tablet Take 50 mg by mouth at bedtime.    Historical Provider, MD   BP 131/46 mmHg  Pulse 101  Temp(Src) 99.9 F (37.7 C) (Oral)  Resp 21  SpO2 93% Physical Exam  Constitutional: He is oriented to person, place, and time. He appears well-nourished.  HENT:  Head: Normocephalic.  Mouth/Throat: Oropharynx is clear and moist.  Eyes: Conjunctivae are normal.  Neck: No tracheal deviation present.  Cardiovascular: Normal rate.   Pulmonary/Chest: No stridor. No respiratory distress. He has wheezes.  Abdominal: Soft. There is no tenderness. There is no guarding.  Musculoskeletal: Normal range of motion. He exhibits no  edema.  Left ankle erythematous and very warm to touch.  Neurological: He is oriented to person, place, and time. No cranial nerve deficit.  Skin: Skin is warm and dry. No rash noted. He is not diaphoretic.  Erythema to left ankle.  Psychiatric: His behavior is normal. Thought content normal.  Nursing note and vitals reviewed.   ED Course  Procedures (including critical care time) Labs Review Labs Reviewed  CULTURE, BLOOD (ROUTINE X 2)  CULTURE, BLOOD (ROUTINE X 2)  URINE CULTURE  COMPREHENSIVE METABOLIC PANEL  URINALYSIS, ROUTINE W REFLEX MICROSCOPIC (NOT AT [facility])  CBC  I-STAT CG4 LACTIC ACID, ED    Imaging Review No results found.   EKG Interpretation   Date/Time:  Friday August 18 2014 13:01:19 EDT Ventricular Rate:  101 PR Interval:  171 QRS Duration: 106 QT Interval:  387 QTC Calculation: 502 R Axis:   59 Text Interpretation:  Sinus tachycardia Low voltage, extremity and  precordial leads Prolonged QT interval Baseline wander in lead(s) V4 V5 no  acute ischemia.  No significant change since last tracing Confirmed by  Gerald Leitz ([id number]) on 08/18/2014 1:13:08 PM      MDM   Final diagnoses:  None    Patient is an 81 year old male presenting with fever and hypoxia per EMS. Patient had 103 fever by EMS as well as wheezing bilaterally. On arrival here his fever is decreased down to 99 and was breathing at 93% on room air. Lung sound wheezy's bilaterally. Concern for sepsis at this time. Source likely lungs. However Concern also because of left ankle having diffuse erythema and warmth. Patient unsure when that started, did not notice until provider pointed it out. No increase in pain. Could represent infected hardware, however with no pain less likely.   Will admti for fever, unsure of source  Merrisa Skorupski Julio Alm, MD 08/18/14 1657

## 2014-08-19 DIAGNOSIS — I480 Paroxysmal atrial fibrillation: Secondary | ICD-10-CM

## 2014-08-19 DIAGNOSIS — M069 Rheumatoid arthritis, unspecified: Secondary | ICD-10-CM

## 2014-08-19 DIAGNOSIS — E038 Other specified hypothyroidism: Secondary | ICD-10-CM

## 2014-08-19 DIAGNOSIS — E785 Hyperlipidemia, unspecified: Secondary | ICD-10-CM

## 2014-08-19 DIAGNOSIS — I5023 Acute on chronic systolic (congestive) heart failure: Secondary | ICD-10-CM

## 2014-08-19 DIAGNOSIS — I10 Essential (primary) hypertension: Secondary | ICD-10-CM

## 2014-08-19 LAB — CBC
HEMATOCRIT: 34 % — AB (ref 39.0–52.0)
HEMOGLOBIN: 11.5 g/dL — AB (ref 13.0–17.0)
MCH: 29.9 pg (ref 26.0–34.0)
MCHC: 33.8 g/dL (ref 30.0–36.0)
MCV: 88.3 fL (ref 78.0–100.0)
PLATELETS: 172 10*3/uL (ref 150–400)
RBC: 3.85 MIL/uL — AB (ref 4.22–5.81)
RDW: 13.6 % (ref 11.5–15.5)
WBC: 12.2 10*3/uL — AB (ref 4.0–10.5)

## 2014-08-19 LAB — COMPREHENSIVE METABOLIC PANEL
ALT: 11 U/L — ABNORMAL LOW (ref 17–63)
ANION GAP: 6 (ref 5–15)
AST: 14 U/L — AB (ref 15–41)
Albumin: 2.6 g/dL — ABNORMAL LOW (ref 3.5–5.0)
Alkaline Phosphatase: 63 U/L (ref 38–126)
BUN: 32 mg/dL — ABNORMAL HIGH (ref 6–20)
CO2: 27 mmol/L (ref 22–32)
Calcium: 8 mg/dL — ABNORMAL LOW (ref 8.9–10.3)
Chloride: 104 mmol/L (ref 101–111)
Creatinine, Ser: 2.3 mg/dL — ABNORMAL HIGH (ref 0.61–1.24)
GFR calc Af Amer: 29 mL/min — ABNORMAL LOW (ref >60)
GFR calc non Af Amer: 25 mL/min — ABNORMAL LOW (ref >60)
GLUCOSE: 173 mg/dL — AB (ref 65–99)
Potassium: 3.5 mmol/L (ref 3.5–5.1)
Sodium: 137 mmol/L (ref 135–145)
Total Bilirubin: 0.5 mg/dL (ref 0.3–1.2)
Total Protein: 5.2 g/dL — ABNORMAL LOW (ref 6.5–8.1)

## 2014-08-19 LAB — GLUCOSE, CAPILLARY
GLUCOSE-CAPILLARY: 157 mg/dL — AB (ref 65–99)
GLUCOSE-CAPILLARY: 194 mg/dL — AB (ref 65–99)
Glucose-Capillary: 146 mg/dL — ABNORMAL HIGH (ref 65–99)
Glucose-Capillary: 140 mg/dL — ABNORMAL HIGH (ref 65–99)

## 2014-08-19 LAB — TSH: TSH: 2.178 u[IU]/mL (ref 0.350–4.500)

## 2014-08-19 NOTE — Progress Notes (Signed)
[facility] PROGRESS NOTE  [name] IRW:[id number] DOB: [birth year] DOA: 08/18/2014 PCP: [name], MD   HPI/Subjective: 81 y.o. WM PMHx chronic systolic heart failure, hypertension, hyperlipidemia, atrial fibrillation, type 2 diabetes mellitus, rheumatoid arthritis,   Presents to the emergency room with a chief complaint of fever and chills. Has been having fever and rigors since yesterday, felt a little better last night, however this morning started feeling bad again. He called EMS, and when they arrived his temperature was 103, was hypoxic, and he was brought to the emergency room. Patient denies any cough or breathing difficulties, he denies any chest pain, he denies any palpitations. He endorses wheezing for the past couple days. He denies any abdominal pain, no nausea, vomiting or diarrhea. He noticed an area on the left foot at the overlying skin is turning red and he has noticed swelling in his left ankle. He denies any pain in that area or any difficulties with ambulation. Has no headache, denies any lightheadedness. No sick contacts. Denies upper respiratory symptoms, denies sore throat/sinus pressure.  8/6 A/O 4, NAD. States weighs himself daily last weight prior to admission 220 pounds (99.7 kg). Patient states December 2015 upon discharge from the hospital weight was 200 pounds (90.7 kg). States was taken off of amiodarone secondary to worsening renal function. Patient states left lower extremity damaged as a child when her foot was run over by a baler---> multiple screws, chronic swelling.    Assessment/Plan:  Sepsis  - patient with elevated fever to 103, tachycardia, elevated WBC and potential LLE cellulitis vs other source - started empirically on Vancomycin and Zosyn, however if Cr continues to increase will DC vancomycin and less cultures positive for bacteria requiring. - blood and urine cultures obtained in the ED - lactic acid is normal  -  LLE cellulitis -  very mild erythematous changes on his left foot, margins drawn; erythema receding - currently it does not look very convincing to me as the cause for his septic picture unless it is very early.  CKD stage IV (baseline Cr~2) - his Cr is at baseline at least for the past 1-2 years, ~2 - Cr trending up DC Lasix  Acute on chronic systolic CHF -Not convinced acute systolic CHF; when you compare current BNP with previous BNP levels - Echocardiogram pending -Discontinue Lasix -May have to place central line in order to obtain CVP - most recent 2D echo November 2015 with EF 35-40%. Obtain BNP, if elevated will repeat 2D echo - daily weights - strict I&Os since admission + 878 ml  Paroxysmal A- fib -currently in NSR -  for now continue Eliquis and Metoprolol - current EKG appears sinus, is regular, however plenty of artifacts. Will repeat.  HTN - hold Bidil for now  DM Type 2 -Hemoglobin A1c pending  -Lipid panel pending  COPD / bronchiectasis  - continue home nebulizer / medications - has mild wheezing but probably chronic, no significant dyspnea, moves air well, no cough, doubt acute exacerbation- SSI  Hypothyroidism -TSH WNL  RA - outpatient management    Code Status: Full  Family Communication: d/w wife, daughter bedside  Disposition Plan: admit to telemetry    Consultants:    Procedures:    Cultures   Antibiotics: Zosyn Vancomycin   DVT prophylaxis Eliquis      Objective: Filed Vitals:   08/18/14 2019 08/18/14 2214 08/19/14 0214 08/19/14 0551  BP:  100/60 102/64   Pulse:  62 60 71  Temp:  98.3  F (36.8 C) 98.1 F (36.7 C) 98.1 F (36.7 C)  TempSrc:  Oral Oral Oral  Resp:  18 18 18   Height:      Weight:    104.39 kg (230 lb 2.2 oz)  SpO2: 94% 97% 98% 97%    Intake/Output Summary (Last 24 hours) at 08/19/14 0743 Last data filed at 08/19/14 0600  Gross per 24 hour  Intake    840 ml  Output    425 ml  Net    415 ml   Filed Weights    08/18/14 1457 08/18/14 1851 08/19/14 0551  Weight: 100.699 kg (222 lb) 104.418 kg (230 lb 3.2 oz) 104.39 kg (230 lb 2.2 oz)     Exam: General: A/O 4, NAD, No acute respiratory distress Eyes: Negative headache, eye pain, double vision,negative scleral hemorrhage ENT: Negative Runny nose, negative ear pain, negative tinnitus, negative gingival bleeding, Neck:  Negative scars, masses, torticollis, lymphadenopathy, JVD Lungs: Clear to auscultation bilaterally, positive mild expiratory wheezing (baseline per patient and family), negative crackles  Cardiovascular: Regular rate and rhythm without murmur gallop or rub normal S1 and S2 Abdomen: negative abdominal pain, negative dysphagia, morbidly obese, positive soft, bowel sounds, no rebound, no ascites, no appreciable mass Extremities: No significant cyanosis, clubbing, or edema right lower extremity; left extremity multiple old surgical scars, foot swollen (baseline per patient), mildly warm, mild erythema, negative pain to palpation. Psychiatric:  Negative depression, negative anxiety, negative fatigue, negative mania  Neurologic:  Cranial nerves II through XII intact, tongue/uvula midline, all extremities muscle strength 5/5, sensation intact throughout, negative dysarthria, negative expressive aphasia, negative receptive aphasia.     Data Reviewed: Basic Metabolic Panel:  Recent Labs Lab 08/18/14 1334 08/19/14 0518  NA 136 137  K 3.6 3.5  CL 99* 104  CO2 29 27  GLUCOSE 181* 173*  BUN 28* 32*  CREATININE 2.14* 2.30*  CALCIUM 8.8* 8.0*   Liver Function Tests:  Recent Labs Lab 08/18/14 1334 08/19/14 0518  AST 16 14*  ALT 12* 11*  ALKPHOS 64 63  BILITOT 0.8 0.5  PROT 5.8* 5.2*  ALBUMIN 3.2* 2.6*   No results for input(s): LIPASE, AMYLASE in the last 168 hours. No results for input(s): AMMONIA in the last 168 hours. CBC:  Recent Labs Lab 08/18/14 1334 08/19/14 0518  WBC 16.1* 12.2*  HGB 12.8* 11.5*  HCT 37.1* 34.0*   MCV 87.3 88.3  PLT 180 172   Cardiac Enzymes: No results for input(s): CKTOTAL, CKMB, CKMBINDEX, TROPONINI in the last 168 hours. BNP (last 3 results)  Recent Labs  08/18/14 1654  BNP 563.2*    ProBNP (last 3 results)  Recent Labs  12/11/13 0320 12/12/13 0435 12/13/13 0320  PROBNP 2480.0* 3116.0* 3799.0*    CBG:  Recent Labs Lab 08/18/14 1839 08/18/14 2210 08/18/14 2237 08/19/14 0606  GLUCAP 173* 216* 191* 157*    No results found for this or any previous visit (from the past 240 hour(s)).   Studies: Dg Chest 2 View  08/18/2014   CLINICAL DATA:  Shortness of breath and wheezing.  EXAM: CHEST - 2 VIEW  COMPARISON:  CT chest without contrast 12/07/2013.  FINDINGS: The heart size is exaggerated by low lung volumes. Pulmonary interstitium is also exaggerate by low lung volumes. No focal airspace disease is present. There is no edema or effusion. A left shoulder hemiarthroplasty is noted.  IMPRESSION: 1. Low lung volumes. 2. No acute cardiopulmonary disease.   Electronically Signed   By: San Morelle  M.D.   On: 08/18/2014 14:29   Dg Ankle Complete Left  08/18/2014   CLINICAL DATA:  Patient with swelling, pain and warmth to the left ankle. Worsening for several days. Initial encounter.  EXAM: LEFT ANKLE COMPLETE - 3+ VIEW  COMPARISON:  Radiograph 11/30/2011  FINDINGS: Re- demonstrated postoperative changes of arthrodesis at the tibiotalar joint. Status post resection of the distal fibula. No significant or obvious perihardware lucency. Soft tissue swelling about the ankle joint. No evidence for acute fracture. Hindfoot degenerative changes. Midfoot degenerative changes. Soft tissue calcifications.  IMPRESSION: Grossly stable appearing postoperative appearance of prior tibiotalar joint arthrodesis and distal fibular resection.  Soft tissue swelling.   Electronically Signed   By: Lovey Newcomer M.D.   On: 08/18/2014 14:32    Scheduled Meds: . apixaban  2.5 mg Oral BID  .  atorvastatin  10 mg Oral q1800  . budesonide (PULMICORT) nebulizer solution  0.5 mg Nebulization BID  . famotidine  10 mg Oral BID  . furosemide  40 mg Oral Daily  . gabapentin  300 mg Oral BID  . insulin aspart  0-9 Units Subcutaneous TID WC  . levothyroxine  25 mcg Oral QAC breakfast  . metoprolol tartrate  25 mg Oral TID  . piperacillin-tazobactam (ZOSYN)  IV  3.375 g Intravenous 3 times per day  . sodium chloride  3 mL Intravenous Q12H  . tamsulosin  0.4 mg Oral QPC supper  . traZODone  50 mg Oral QHS  . vancomycin  1,000 mg Intravenous Q24H   Continuous Infusions:   Principal Problem:   Sepsis Active Problems:   Diabetes   HLD (hyperlipidemia)   Rheumatoid arthritis   Essential hypertension   Anemia   Chronic obstructive airway disease with asthma   Atrial fibrillation   Fever   CKD (chronic kidney disease) stage 4, GFR 15-29 ml/min   Acute on chronic systolic heart failure   Bronchiectasis   Hypothyroidism    Time spent: 40 minutes  WOODS, [place] [facility] Pager [phone]. If 7PM-7AM, please contact night-coverage at [email or web address], password [facility] 08/19/2014, 7:43 AM  LOS: 1 day    Care during the described time interval was provided by me .  I have reviewed this patient's available data, including medical history, events of note, physical examination, radiology studies and test results as part of my evaluation  Dia Crawford, MD [phone] Pager

## 2014-08-19 NOTE — Evaluation (Signed)
Physical Therapy Evaluation Patient Details Name: [name] MRN: [id number] DOB: 1930/04/20 Today's Date: 08/19/2014   History of Present Illness  Patient is an 81 yo male admitted 08/18/14 with fever, chills.  Patient with sepsis - ? LLE cellulitis.  PMH:  DM, neuropathy, RA, CHF, HTN, Afib  Clinical Impression  Patient presents with problems listed below.  Will benefit from acute PT to maximize functional independence prior to discharge home with wife.  Feel patient will return to PLOF prior to discharge.  Do not anticipate any f/u PT needs at d/c.    Follow Up Recommendations No PT follow up;Supervision/Assistance - 24 hour    Equipment Recommendations  None recommended by PT    Recommendations for Other Services       Precautions / Restrictions Precautions Precautions: Fall Precaution Comments: Patient reports he fell 2 weeks ago, catching his left leg/foot on a fence.  He fell forward onto face/head. Restrictions Weight Bearing Restrictions: No      Mobility  Bed Mobility Overal bed mobility: Modified Independent             General bed mobility comments: Increased time and use of bed rail  Transfers Overall transfer level: Needs assistance Equipment used: Rolling walker (2 wheeled) Transfers: Sit to/from Stand Sit to Stand: Supervision         General transfer comment: No physical assist needed.  Verbal cues for hand placement  Ambulation/Gait Ambulation/Gait assistance: Supervision Ambulation Distance (Feet): 180 Feet Assistive device: Rolling walker (2 wheeled) Gait Pattern/deviations: Step-through pattern;Decreased stride length Gait velocity: Decreased Gait velocity interpretation: Below normal speed for age/gender General Gait Details: Verbal cues for safe use of RW.  Good balance with RW.  Stairs            Wheelchair Mobility    Modified Rankin (Stroke Patients Only)       Balance                                             Pertinent Vitals/Pain Pain Assessment: No/denies pain    Home Living Family/patient expects to be discharged to:: Private residence Living Arrangements: Spouse/significant other Available Help at Discharge: Family;Available 24 hours/day Type of Home: House Home Access: Ramped entrance     Home Layout: One level Home Equipment: Walker - 2 wheels;Shower seat - built in;Shower seat;Wheelchair - manual;Bedside commode      Prior Function Level of Independence: Independent               Hand Dominance   Dominant Hand: Left    Extremity/Trunk Assessment   Upper Extremity Assessment: Generalized weakness (RA changes in hands)           Lower Extremity Assessment: Generalized weakness;LLE deficits/detail (RA changes in feet)   LLE Deficits / Details: Lt ankle fusion in past.  Noted reddness ankle/foot     Communication   Communication: HOH (Wears hearing aids)  Cognition Arousal/Alertness: Awake/alert Behavior During Therapy: WFL for tasks assessed/performed Overall Cognitive Status: Within Functional Limits for tasks assessed                      General Comments General comments (skin integrity, edema, etc.): Noted reddness Lt ankle/foot    Exercises        Assessment/Plan    PT Assessment Patient needs continued PT services  PT Diagnosis  Difficulty walking;Generalized weakness   PT Problem List Decreased strength;Decreased balance;Decreased mobility;Impaired sensation;Decreased skin integrity  PT Treatment Interventions DME instruction;Gait training;Functional mobility training;Therapeutic activities;Therapeutic exercise;Patient/family education   PT Goals (Current goals can be found in the Care Plan section) Acute Rehab PT Goals Patient Stated Goal: To know what is wrong PT Goal Formulation: With patient/family Time For Goal Achievement: 08/26/14 Potential to Achieve Goals: Good    Frequency Min 3X/week   Barriers to discharge         Co-evaluation               End of Session Equipment Utilized During Treatment: Gait belt Activity Tolerance: Patient tolerated treatment well Patient left: in bed;with call bell/phone within reach;with family/visitor present Nurse Communication: Mobility status         Time: 2574-9355 PT Time Calculation (min) (ACUTE ONLY): 24 min   Charges:   PT Evaluation $Initial PT Evaluation Tier I: 1 Procedure PT Treatments $Gait Training: 8-22 mins   PT G CodesDespina Pole 09-12-2014, 5:01 PM Carita Pian. Sanjuana Kava, [place] Pager [phone]

## 2014-08-20 ENCOUNTER — Inpatient Hospital Stay (HOSPITAL_COMMUNITY): Payer: Medicare Other

## 2014-08-20 DIAGNOSIS — I272 Pulmonary hypertension, unspecified: Secondary | ICD-10-CM | POA: Diagnosis present

## 2014-08-20 DIAGNOSIS — J479 Bronchiectasis, uncomplicated: Secondary | ICD-10-CM

## 2014-08-20 DIAGNOSIS — I480 Paroxysmal atrial fibrillation: Secondary | ICD-10-CM | POA: Diagnosis present

## 2014-08-20 DIAGNOSIS — I5022 Chronic systolic (congestive) heart failure: Secondary | ICD-10-CM | POA: Diagnosis present

## 2014-08-20 DIAGNOSIS — N184 Chronic kidney disease, stage 4 (severe): Secondary | ICD-10-CM | POA: Diagnosis present

## 2014-08-20 DIAGNOSIS — J438 Other emphysema: Secondary | ICD-10-CM

## 2014-08-20 DIAGNOSIS — I270 Primary pulmonary hypertension: Secondary | ICD-10-CM

## 2014-08-20 DIAGNOSIS — E038 Other specified hypothyroidism: Secondary | ICD-10-CM | POA: Diagnosis present

## 2014-08-20 DIAGNOSIS — L03116 Cellulitis of left lower limb: Secondary | ICD-10-CM

## 2014-08-20 LAB — COMPREHENSIVE METABOLIC PANEL
ALT: 10 U/L — ABNORMAL LOW (ref 17–63)
AST: 12 U/L — ABNORMAL LOW (ref 15–41)
Albumin: 2.6 g/dL — ABNORMAL LOW (ref 3.5–5.0)
Alkaline Phosphatase: 58 U/L (ref 38–126)
Anion gap: 8 (ref 5–15)
BUN: 26 mg/dL — ABNORMAL HIGH (ref 6–20)
CO2: 26 mmol/L (ref 22–32)
CREATININE: 2.15 mg/dL — AB (ref 0.61–1.24)
Calcium: 8.4 mg/dL — ABNORMAL LOW (ref 8.9–10.3)
Chloride: 104 mmol/L (ref 101–111)
GFR calc non Af Amer: 27 mL/min — ABNORMAL LOW (ref >60)
GFR, EST AFRICAN AMERICAN: 31 mL/min — AB (ref >60)
Glucose, Bld: 173 mg/dL — ABNORMAL HIGH (ref 65–99)
Potassium: 3.7 mmol/L (ref 3.5–5.1)
Sodium: 138 mmol/L (ref 135–145)
Total Bilirubin: 0.6 mg/dL (ref 0.3–1.2)
Total Protein: 5.4 g/dL — ABNORMAL LOW (ref 6.5–8.1)

## 2014-08-20 LAB — URINE CULTURE

## 2014-08-20 LAB — GLUCOSE, CAPILLARY
Glucose-Capillary: 159 mg/dL — ABNORMAL HIGH (ref 65–99)
Glucose-Capillary: 189 mg/dL — ABNORMAL HIGH (ref 65–99)
Glucose-Capillary: 171 mg/dL — ABNORMAL HIGH (ref 65–99)
Glucose-Capillary: 111 mg/dL — ABNORMAL HIGH (ref 65–99)

## 2014-08-20 LAB — CBC WITH DIFFERENTIAL/PLATELET
BASOS ABS: 0 10*3/uL (ref 0.0–0.1)
BASOS PCT: 0 % (ref 0–1)
Eosinophils Absolute: 0.3 10*3/uL (ref 0.0–0.7)
Eosinophils Relative: 4 % (ref 0–5)
HCT: 34 % — ABNORMAL LOW (ref 39.0–52.0)
Hemoglobin: 11.5 g/dL — ABNORMAL LOW (ref 13.0–17.0)
Lymphocytes Relative: 17 % (ref 12–46)
Lymphs Abs: 1.5 10*3/uL (ref 0.7–4.0)
MCH: 29.9 pg (ref 26.0–34.0)
MCHC: 33.8 g/dL (ref 30.0–36.0)
MCV: 88.3 fL (ref 78.0–100.0)
MONO ABS: 0.7 10*3/uL (ref 0.1–1.0)
Monocytes Relative: 8 % (ref 3–12)
NEUTROS ABS: 6.4 10*3/uL (ref 1.7–7.7)
NEUTROS PCT: 71 % (ref 43–77)
PLATELETS: 186 10*3/uL (ref 150–400)
RBC: 3.85 MIL/uL — ABNORMAL LOW (ref 4.22–5.81)
RDW: 13.6 % (ref 11.5–15.5)
WBC: 9 10*3/uL (ref 4.0–10.5)

## 2014-08-20 LAB — LIPID PANEL
Cholesterol: 179 mg/dL (ref 0–200)
HDL: 30 mg/dL — AB (ref >40)
LDL Cholesterol: 128 mg/dL — ABNORMAL HIGH (ref 0–99)
TRIGLYCERIDES: 105 mg/dL (ref <150)
Total CHOL/HDL Ratio: 6 RATIO
VLDL: 21 mg/dL (ref 0–40)

## 2014-08-20 LAB — T4, FREE: FREE T4: 1.35 ng/dL — AB (ref 0.61–1.12)

## 2014-08-20 LAB — MAGNESIUM: MAGNESIUM: 1.9 mg/dL (ref 1.7–2.4)

## 2014-08-20 NOTE — Progress Notes (Signed)
Utilization review completed.  

## 2014-08-20 NOTE — Progress Notes (Signed)
  Echocardiogram 2D Echocardiogram has been performed.  Darlina Sicilian M 08/20/2014, 3:16 PM

## 2014-08-20 NOTE — Progress Notes (Signed)
Pharmacist Heart Failure Core Measure Documentation  Assessment: [name] has an EF documented as 35-40% on November 2015.  Rationale: Heart failure patients with left ventricular systolic dysfunction (LVSD) and an EF < 40% should be prescribed an angiotensin converting enzyme inhibitor (ACEI) or angiotensin receptor blocker (ARB) at discharge unless a contraindication is documented in the medical record.  This patient is not currently on an ACEI or ARB for HF.  This note is being placed in the record in order to provide documentation that a contraindication to the use of these agents is present for this encounter.  ACE Inhibitor or Angiotensin Receptor Blocker is contraindicated (specify all that apply)  []   ACEI allergy AND ARB allergy []   Angioedema []   Moderate or severe aortic stenosis []   Hyperkalemia [x]   Hypotension []   Renal artery stenosis [x]   Worsening renal function, preexisting renal disease or dysfunction   [name] 08/20/2014 1:40 PM

## 2014-08-20 NOTE — Progress Notes (Signed)
[facility] PROGRESS NOTE  [name] WUJ:[id number] DOB: 1930-03-20 DOA: 08/18/2014 PCP: William Brome, MD   HPI/Subjective: 81 y.o. WM PMHx chronic systolic heart failure, hypertension, hyperlipidemia, atrial fibrillation, type 2 diabetes mellitus, rheumatoid arthritis,   Presents to the emergency room with a chief complaint of fever and chills. Has been having fever and rigors since yesterday, felt a little better last night, however this morning started feeling bad again. He called EMS, and when they arrived his temperature was 103, was hypoxic, and he was brought to the emergency room. Patient denies any cough or breathing difficulties, he denies any chest pain, he denies any palpitations. He endorses wheezing for the past couple days. He denies any abdominal pain, no nausea, vomiting or diarrhea. He noticed an area on the left foot at the overlying skin is turning red and he has noticed swelling in his left ankle. He denies any pain in that area or any difficulties with ambulation. Has no headache, denies any lightheadedness. No sick contacts. Denies upper respiratory symptoms, denies sore throat/sinus pressure.  8/6 A/O 4, NAD. States weighs himself daily last weight prior to admission 220 pounds (99.7 kg). Patient states December 2015 upon discharge from the hospital weight was 200 pounds (90.7 kg). States was taken off of amiodarone secondary to worsening renal function. Patient states left lower extremity damaged as a child when her foot was run over by a baler---> multiple screws, chronic swelling. 8/7 A/O 4, NAD   Assessment/Plan:  Sepsis  - patient with elevated fever to 103, tachycardia, elevated WBC and potential LLE cellulitis vs other source - started empirically on Vancomycin and Zosyn, however if Cr continues to increase will DC vancomycin and less cultures positive for bacteria requiring. - blood and urine cultures obtained in the ED - lactic acid is normal   LLE  cellulitis - Resolving. Not totally convinced he has cellulitis. Do not believe requires vancomycin will DC  CKD stage IV (baseline Cr~2) - his Cr is at baseline at least for the past 1-2 years, ~2 - Cr trending up DC Lasix  Chronic systolic CHF -Not convinced acute systolic CHF; when you compare current BNP with previous BNP levels - Echocardiogram; marginally improved cardiac function, when compared  2D echo November 2015. see results below -Discontinue Lasix -May have to place central line in order to obtain CVP - daily weights - strict I&Os since admission + 40 ml  Pulmonary hypertension -See chronic systolic CHF  Paroxysmal A- fib -currently in NSR -  for now continue Eliquis and Metoprolol - current EKG appears sinus, is regular, however plenty of artifacts. Will repeat.  HTN - hold Bidil for now  DM Type 2 -Hemoglobin A1c pending  -Lipid panel pending  COPD / bronchiectasis  - continue home nebulizer / medications - has mild wheezing but probably chronic, no significant dyspnea, moves air well, no cough, doubt acute exacerbation- SSI  Hypothyroidism -TSH/FREE T4 WNL  RA - outpatient management    Code Status: Full  Family Communication: d/w wife, daughter bedside  Disposition Plan: admit to telemetry    Consultants:    Procedures: 8/7 echocardiogram- Left ventricle: mild concentric hypertrophy. LVEF= 50% to 55%. -(grade 2 diastolic dysfunction). - Pulmonary arteries: PA peak pressure: 40 mm Hg (S).   Cultures 8/5 blood right AC/LEFT HAND NGTD 8/5 urine positive multiple species   Antibiotics: Zosyn 8/5>> Vancomycin 8/6>> stopped 8/7   DVT prophylaxis Eliquis      Objective: Filed Vitals:   08/19/14 2100  08/20/14 0545 08/20/14 0845 08/20/14 0956  BP: 107/49 141/62  119/55  Pulse: 77 74  68  Temp: 98.1 F (36.7 C) 97.5 F (36.4 C)  98.2 F (36.8 C)  TempSrc: Oral Oral  Oral  Resp: 18 18  20   Height:      Weight:  104.2 kg  (229 lb 11.5 oz)    SpO2: 99% 95% 96% 99%    Intake/Output Summary (Last 24 hours) at 08/20/14 1833 Last data filed at 08/20/14 1437  Gross per 24 hour  Intake    513 ml  Output   1601 ml  Net  -1088 ml   Filed Weights   08/18/14 1851 08/19/14 0551 08/20/14 0545  Weight: 104.418 kg (230 lb 3.2 oz) 104.39 kg (230 lb 2.2 oz) 104.2 kg (229 lb 11.5 oz)     Exam: General: A/O 4, NAD, No acute respiratory distress Eyes: Negative headache, eye pain, double vision,negative scleral hemorrhage ENT: Negative Runny nose, negative ear pain, negative tinnitus, negative gingival bleeding, Neck:  Negative scars, masses, torticollis, lymphadenopathy, JVD Lungs: Clear to auscultation bilaterally, positive mild expiratory wheezing (baseline per patient and family), negative crackles  Cardiovascular: Regular rate and rhythm without murmur gallop or rub normal S1 and S2 Abdomen: negative abdominal pain, negative dysphagia, morbidly obese, positive soft, bowel sounds, no rebound, no ascites, no appreciable mass Extremities: No significant cyanosis, clubbing, or edema right lower extremity; left extremity multiple old surgical scars, foot swollen (baseline per patient), mildly warm, mild erythema, negative pain to palpation. Psychiatric:  Negative depression, negative anxiety, negative fatigue, negative mania  Neurologic:  Cranial nerves II through XII intact, tongue/uvula midline, all extremities muscle strength 5/5, sensation intact throughout, negative dysarthria, negative expressive aphasia, negative receptive aphasia.     Data Reviewed: Basic Metabolic Panel:  Recent Labs Lab 08/18/14 1334 08/19/14 0518 08/20/14 0648  NA 136 137 138  K 3.6 3.5 3.7  CL 99* 104 104  CO2 29 27 26   GLUCOSE 181* 173* 173*  BUN 28* 32* 26*  CREATININE 2.14* 2.30* 2.15*  CALCIUM 8.8* 8.0* 8.4*  MG  --   --  1.9   Liver Function Tests:  Recent Labs Lab 08/18/14 1334 08/19/14 0518 08/20/14 0648  AST 16  14* 12*  ALT 12* 11* 10*  ALKPHOS 64 63 58  BILITOT 0.8 0.5 0.6  PROT 5.8* 5.2* 5.4*  ALBUMIN 3.2* 2.6* 2.6*   No results for input(s): LIPASE, AMYLASE in the last 168 hours. No results for input(s): AMMONIA in the last 168 hours. CBC:  Recent Labs Lab 08/18/14 1334 08/19/14 0518 08/20/14 0648  WBC 16.1* 12.2* 9.0  NEUTROABS  --   --  6.4  HGB 12.8* 11.5* 11.5*  HCT 37.1* 34.0* 34.0*  MCV 87.3 88.3 88.3  PLT 180 172 186   Cardiac Enzymes: No results for input(s): CKTOTAL, CKMB, CKMBINDEX, TROPONINI in the last 168 hours. BNP (last 3 results)  Recent Labs  08/18/14 1654  BNP 563.2*    ProBNP (last 3 results)  Recent Labs  12/11/13 0320 12/12/13 0435 12/13/13 0320  PROBNP 2480.0* 3116.0* 3799.0*    CBG:  Recent Labs Lab 08/19/14 1624 08/19/14 2126 08/20/14 0616 08/20/14 1116 08/20/14 1655  GLUCAP 146* 140* 159* 189* 171*    Recent Results (from the past 240 hour(s))  Culture, blood (routine x 2)     Status: None (Preliminary result)   Collection Time: 08/18/14  1:34 PM  Result Value Ref Range Status   Specimen Description  BLOOD RIGHT ANTECUBITAL  Final   Special Requests BOTTLES DRAWN AEROBIC AND ANAEROBIC 5CC  Final   Culture NO GROWTH 2 DAYS  Final   Report Status PENDING  Incomplete  Culture, blood (routine x 2)     Status: None (Preliminary result)   Collection Time: 08/18/14  1:49 PM  Result Value Ref Range Status   Specimen Description BLOOD LEFT HAND  Final   Special Requests BOTTLES DRAWN AEROBIC AND ANAEROBIC 4CC  Final   Culture NO GROWTH 2 DAYS  Final   Report Status PENDING  Incomplete  Urine culture     Status: None   Collection Time: 08/18/14  3:27 PM  Result Value Ref Range Status   Specimen Description URINE, CLEAN CATCH  Final   Special Requests NONE  Final   Culture MULTIPLE SPECIES PRESENT, SUGGEST RECOLLECTION  Final   Report Status 08/20/2014 FINAL  Final     Studies: No results found.  Scheduled Meds: . apixaban   2.5 mg Oral BID  . atorvastatin  10 mg Oral q1800  . budesonide (PULMICORT) nebulizer solution  0.5 mg Nebulization BID  . famotidine  10 mg Oral BID  . gabapentin  300 mg Oral BID  . insulin aspart  0-9 Units Subcutaneous TID WC  . levothyroxine  25 mcg Oral QAC breakfast  . metoprolol tartrate  25 mg Oral TID  . piperacillin-tazobactam (ZOSYN)  IV  3.375 g Intravenous 3 times per day  . sodium chloride  3 mL Intravenous Q12H  . tamsulosin  0.4 mg Oral QPC supper  . traZODone  50 mg Oral QHS  . vancomycin  1,000 mg Intravenous Q24H   Continuous Infusions:   Principal Problem:   Sepsis Active Problems:   Diabetes   HLD (hyperlipidemia)   Rheumatoid arthritis   Essential hypertension   Anemia   Chronic obstructive airway disease with asthma   Atrial fibrillation   Fever   CKD (chronic kidney disease) stage 4, GFR 15-29 ml/min   Acute on chronic systolic heart failure   Bronchiectasis   Hypothyroidism    Time spent: 40 minutes  Callin Ashe, [place] [facility] Pager [phone]. If 7PM-7AM, please contact night-coverage at [email or web address], password [facility] 08/20/2014, 6:33 PM  LOS: 2 days    Care during the described time interval was provided by me .  I have reviewed this patient's available data, including medical history, events of note, physical examination, radiology studies and test results as part of my evaluation  Dia Crawford, MD [phone] Pager

## 2014-08-21 DIAGNOSIS — A419 Sepsis, unspecified organism: Secondary | ICD-10-CM | POA: Diagnosis present

## 2014-08-21 LAB — COMPREHENSIVE METABOLIC PANEL WITH GFR
ALT: 11 U/L — ABNORMAL LOW (ref 17–63)
AST: 12 U/L — ABNORMAL LOW (ref 15–41)
Albumin: 2.7 g/dL — ABNORMAL LOW (ref 3.5–5.0)
Alkaline Phosphatase: 59 U/L (ref 38–126)
Anion gap: 9 (ref 5–15)
BUN: 24 mg/dL — ABNORMAL HIGH (ref 6–20)
CO2: 27 mmol/L (ref 22–32)
Calcium: 8.7 mg/dL — ABNORMAL LOW (ref 8.9–10.3)
Chloride: 103 mmol/L (ref 101–111)
Creatinine, Ser: 2.11 mg/dL — ABNORMAL HIGH (ref 0.61–1.24)
GFR calc Af Amer: 32 mL/min — ABNORMAL LOW (ref >60)
GFR calc non Af Amer: 28 mL/min — ABNORMAL LOW (ref >60)
Glucose, Bld: 184 mg/dL — ABNORMAL HIGH (ref 65–99)
Potassium: 3.6 mmol/L (ref 3.5–5.1)
Sodium: 139 mmol/L (ref 135–145)
Total Bilirubin: 0.6 mg/dL (ref 0.3–1.2)
Total Protein: 5.7 g/dL — ABNORMAL LOW (ref 6.5–8.1)

## 2014-08-21 LAB — CBC WITH DIFFERENTIAL/PLATELET
Basophils Absolute: 0 K/uL (ref 0.0–0.1)
Basophils Relative: 1 % (ref 0–1)
Eosinophils Absolute: 0.4 K/uL (ref 0.0–0.7)
Eosinophils Relative: 5 % (ref 0–5)
HCT: 34.5 % — ABNORMAL LOW (ref 39.0–52.0)
Hemoglobin: 12 g/dL — ABNORMAL LOW (ref 13.0–17.0)
Lymphocytes Relative: 24 % (ref 12–46)
Lymphs Abs: 2.1 K/uL (ref 0.7–4.0)
MCH: 30.9 pg (ref 26.0–34.0)
MCHC: 34.8 g/dL (ref 30.0–36.0)
MCV: 88.9 fL (ref 78.0–100.0)
Monocytes Absolute: 0.6 K/uL (ref 0.1–1.0)
Monocytes Relative: 7 % (ref 3–12)
Neutro Abs: 5.6 K/uL (ref 1.7–7.7)
Neutrophils Relative %: 63 % (ref 43–77)
Platelets: 210 K/uL (ref 150–400)
RBC: 3.88 MIL/uL — ABNORMAL LOW (ref 4.22–5.81)
RDW: 13.3 % (ref 11.5–15.5)
WBC: 8.7 K/uL (ref 4.0–10.5)

## 2014-08-21 LAB — HEMOGLOBIN A1C
HEMOGLOBIN A1C: 6.9 % — AB (ref 4.8–5.6)
MEAN PLASMA GLUCOSE: 151 mg/dL

## 2014-08-21 LAB — GLUCOSE, CAPILLARY
GLUCOSE-CAPILLARY: 114 mg/dL — AB (ref 65–99)
Glucose-Capillary: 155 mg/dL — ABNORMAL HIGH (ref 65–99)
Glucose-Capillary: 152 mg/dL — ABNORMAL HIGH (ref 65–99)
Glucose-Capillary: 202 mg/dL — ABNORMAL HIGH (ref 65–99)

## 2014-08-21 LAB — MAGNESIUM: MAGNESIUM: 2.1 mg/dL (ref 1.7–2.4)

## 2014-08-21 MED ORDER — ATORVASTATIN CALCIUM 20 MG PO TABS
20.0000 mg | ORAL_TABLET | Freq: Every day | ORAL | Status: DC
  Filled 2014-08-21: qty 1

## 2014-08-21 MED ORDER — ATORVASTATIN CALCIUM 20 MG PO TABS: 20.0000 mg | ORAL_TABLET | Freq: Every day | ORAL | Status: DC

## 2014-08-21 NOTE — Progress Notes (Signed)
Physical Therapy Treatment Patient Details Name: BADR PIEDRA MRN: [id number] DOB: 1930/06/03 Today's Date: 08/21/2014    History of Present Illness Patient is an 81 yo male admitted 08/18/14 with fever, chills.  Patient with sepsis - ? LLE cellulitis.  PMH:  DM, neuropathy, RA, CHF, HTN, Afib    PT Comments    Pt ambulated with and without RW and steadier with RW.  Discussed using RW initially at home and he verbalized understanding.  Follow Up Recommendations  No PT follow up;Supervision/Assistance - 24 hour     Equipment Recommendations  None recommended by PT    Recommendations for Other Services       Precautions / Restrictions Precautions Precautions: Fall Precaution Comments: Patient reports he fell 2 weeks ago, catching his left leg/foot on a fence.  He fell forward onto face/head. Restrictions Weight Bearing Restrictions: No    Mobility  Bed Mobility Overal bed mobility: Modified Independent                Transfers Overall transfer level: Modified independent Equipment used: None Transfers: Sit to/from Stand Sit to Stand: Modified independent (Device/Increase time)         General transfer comment: MOD I  Ambulation/Gait Ambulation/Gait assistance: Supervision;Min guard Ambulation Distance (Feet): 140 Feet (and then 240' without RW) Assistive device: None Gait Pattern/deviations: Step-through pattern Gait velocity: WNL   General Gait Details: Ambulated without RW with min/guard with slight unsteadiness noted with hx of L ankle fusion.  With RW, ambulated with increased steadiness for 240'  Discussed use of RW at home until he feels a little steadier.   Stairs            Wheelchair Mobility    Modified Rankin (Stroke Patients Only)       Balance                                    Cognition Arousal/Alertness: Awake/alert Behavior During Therapy: WFL for tasks assessed/performed Overall Cognitive Status: Within  Functional Limits for tasks assessed                      Exercises      General Comments        Pertinent Vitals/Pain Pain Assessment: No/denies pain    Home Living                      Prior Function            PT Goals (current goals can now be found in the care plan section) Acute Rehab PT Goals Patient Stated Goal: To know what is wrong PT Goal Formulation: With patient/family Time For Goal Achievement: 08/26/14 Potential to Achieve Goals: Good Progress towards PT goals: Progressing toward goals    Frequency  Min 3X/week    PT Plan Current plan remains appropriate    Co-evaluation             End of Session Equipment Utilized During Treatment: Gait belt Activity Tolerance: Patient tolerated treatment well Patient left: in bed;with nursing/sitter in room;with family/visitor present;with call bell/phone within reach     Time: 1225-1237 PT Time Calculation (min) (ACUTE ONLY): 12 min  Charges:  $Gait Training: 8-22 mins                    G Codes:      Alfonza Toft LUBECK  08/21/2014, 12:55 PM

## 2014-08-21 NOTE — Discharge Summary (Signed)
Physician Discharge Summary  ABSALOM ARO ERD:[id number] DOB: 1930-05-22 DOA: 08/18/2014  PCP: [name], MD  Admit date: 08/18/2014 Discharge date: 08/21/2014  Time spent: 35 minutes  Recommendations for Outpatient Follow-up:  Sepsis due to undetermined organism - patient with elevated fever to 103, tachycardia, elevated WBC and potential LLE cellulitis vs other source - started empirically on Vancomycin and Zosyn, however if Cr continues to increase will DC vancomycin  -Blood and urine cultures negative, final dose of Zosyn in the a.m. -Follow-up with PCP  LLE cellulitis - Resolving.   CKD stage IV (baseline Cr~2) - his Cr is at baseline at least for the past 1-2 years, ~2 - Cr close to baseline - -Follow-up with PCP  Chronic systolic CHF -Not convinced acute systolic CHF; when you compare current BNP with previous BNP levels - Echocardiogram; marginally improved cardiac function, when compared 2D echo November 2015. see results below -Discontinue Lasix - strict I&Os since admission + 40 ml; patient euvolemic -Weigh patient on standing scale prior to discharge this will be his new dry weight  Pulmonary hypertension -See chronic systolic CHF  Paroxysmal A- fib -currently in NSR - for now continue Eliquis and Metoprolol  HTN - hold Bidil for now  DM Type 2 controlled -8/6 Hemoglobin A1c = 6.9   -DO NOT RESTART JENTADUETO secondary to chronic kidney disease  HLD -Lipid panel not within ADA guidelines -Increase Lipitor to 20 mg day -PCP to monitor cholesterol  COPD / bronchiectasis  - continue home nebulizer / medications  Hypothyroidism -TSH/FREE T4 WNL  RA - outpatient management   Discharge Diagnoses:  Principal Problem:   Sepsis Active Problems:   Diabetes   HLD (hyperlipidemia)   Rheumatoid arthritis   Essential hypertension   Anemia   Chronic obstructive airway disease with asthma   Atrial fibrillation   Fever   CKD (chronic kidney disease)  stage 4, GFR 15-29 ml/min   Acute on chronic systolic heart failure   Bronchiectasis   Hypothyroidism   Chronic systolic CHF (congestive heart failure)   Chronic kidney disease (CKD), stage IV (severe)   Cellulitis of left lower extremity   Pulmonary hypertension   Paroxysmal atrial fibrillation   Other emphysema   Bronchiectasis without complication   Other specified hypothyroidism   Sepsis due to undetermined organism   Discharge Condition: Stable  Diet recommendation: Diabetic  Filed Weights   08/19/14 0551 08/20/14 0545 08/21/14 0453  Weight: 104.39 kg (230 lb 2.2 oz) 104.2 kg (229 lb 11.5 oz) 104.554 kg (230 lb 8 oz)    History of present illness:  81 y.o. WM PMHx chronic systolic heart failure, hypertension, hyperlipidemia, atrial fibrillation, type 2 diabetes mellitus, rheumatoid arthritis,   Presents to the emergency room with a chief complaint of fever and chills. Has been having fever and rigors since yesterday, felt a little better last night, however this morning started feeling bad again. He called EMS, and when they arrived his temperature was 103, was hypoxic, and he was brought to the emergency room. Patient denies any cough or breathing difficulties, he denies any chest pain, he denies any palpitations. He endorses wheezing for the past couple days. He denies any abdominal pain, no nausea, vomiting or diarrhea. He noticed an area on the left foot at the overlying skin is turning red and he has noticed swelling in his left ankle. He denies any pain in that area or any difficulties with ambulation. Has no headache, denies any lightheadedness. No sick contacts. Denies upper  respiratory symptoms, denies sore throat/sinus pressure.  During this admission patient was treated for unknown infection which resolved with appropriate antibiotics. In addition patient was informed that his previous dry weight of  200 pounds (90.7 kg). From December 2015 was no longer valid. His new  weight on admission  220 pounds (99.7 kg). Patient will be weighed on standing scale prior to his discharge and record this weight as his discharge dry weight.     Procedures: 8/7 echocardiogram- Left ventricle: mild concentric hypertrophy. LVEF= 50% to 55%. -(grade 2 diastolic dysfunction). - Pulmonary arteries: PA peak pressure: 40 mm Hg (S).   Cultures 8/5 blood right AC/LEFT HAND NGTD 8/5 urine positive multiple species   Antibiotics: Zosyn 8/5>> Vancomycin 8/6>> stopped 8/7   DVT prophylaxis Eliquis   Discharge Exam: Filed Vitals:   08/21/14 0816 08/21/14 1300 08/21/14 2044 08/21/14 2101  BP: 156/78 139/59 152/98   Pulse: 69 76 69   Temp: 97.8 F (36.6 C) 98.2 F (36.8 C) 97.4 F (36.3 C)   TempSrc: Oral Oral Oral   Resp: 18 18 18    Height:      Weight:      SpO2: 97% 96% 100% 98%    General: A/O 4, NAD, No acute respiratory distress Eyes: Negative headache, eye pain, double vision,negative scleral hemorrhage ENT: Negative Runny nose, negative ear pain, negative tinnitus, negative gingival bleeding, Neck: Negative scars, masses, torticollis, lymphadenopathy, JVD Lungs: Clear to auscultation bilaterally, positive mild expiratory wheezing (baseline per patient and family), negative crackles  Cardiovascular: Regular rate and rhythm without murmur gallop or rub normal S1 and S2 Abdomen: negative abdominal pain, negative dysphagia, morbidly obese, positive soft, bowel sounds, no rebound, no ascites, no appreciable mass Extremities: No significant cyanosis, clubbing, or edema right lower extremity; left extremity multiple old surgical scars, foot swollen (baseline per patient), negative warm, negative erythema, negative pain to palpation.    Discharge Instructions     Medication List    STOP taking these medications        amiodarone 400 MG tablet  Commonly known as:  PACERONE     fluticasone 110 MCG/ACT inhaler  Commonly known as:  FLOVENT HFA      FLUTTER Devi     furosemide 40 MG tablet  Commonly known as:  LASIX     isosorbide-hydrALAZINE 20-37.5 MG per tablet  Commonly known as:  BIDIL     JENTADUETO 2.05-998 MG Tabs  Generic drug:  Linagliptin-Metformin HCl      TAKE these medications        atorvastatin 20 MG tablet  Commonly known as:  LIPITOR  Take 1 tablet (20 mg total) by mouth daily at 6 PM.  Start taking on:  08/22/2014     cetirizine 10 MG tablet  Commonly known as:  ZYRTEC  Take 10 mg by mouth daily.     ELIQUIS 2.5 MG Tabs tablet  Generic drug:  apixaban  Take 2.5 mg by mouth 2 (two) times daily.     ferrous sulfate 325 (65 FE) MG tablet  Take 325 mg by mouth 2 (two) times daily with a meal.     finasteride 5 MG tablet  Commonly known as:  PROSCAR  Take 5 mg by mouth at bedtime.     folic acid 1 MG tablet  Commonly known as:  FOLVITE  Take 1 mg by mouth daily.     gabapentin 300 MG capsule  Commonly known as:  NEURONTIN  Take 1 capsule (300  mg total) by mouth 2 (two) times daily.     HYDROcodone-acetaminophen 5-325 MG per tablet  Commonly known as:  NORCO  Take 1 tablet by mouth every 6 (six) hours as needed for moderate pain.     levothyroxine 25 MCG tablet  Commonly known as:  SYNTHROID, LEVOTHROID  Take 25 mcg by mouth daily before breakfast.     linagliptin 5 MG Tabs tablet  Commonly known as:  TRADJENTA  Take 1 tablet (5 mg total) by mouth daily.     metoprolol 50 MG tablet  Commonly known as:  LOPRESSOR  Take 25 mg by mouth 3 (three) times daily.     mometasone 220 MCG/INH inhaler  Commonly known as:  ASMANEX 120 METERED DOSES  Inhale 2 puffs into the lungs daily.     ORENCIA IV  Inject into the vein every 30 (thirty) days.     ranitidine 150 MG tablet  Commonly known as:  ZANTAC  Take 150 mg by mouth 2 (two) times daily.     tamsulosin 0.4 MG Caps capsule  Commonly known as:  FLOMAX  Take 0.4 mg by mouth daily after supper.     traZODone 50 MG tablet  Commonly known  as:  DESYREL  Take 50 mg by mouth at bedtime.       No Known Allergies Follow-up Information    Follow up with [name], MD.   Specialties:  Family Medicine, Interventional Cardiology, Radiology, Anesthesiology   Why:  Follow-up with Dr. Dirk Dress in 7-14 days diabetes type 2 medication titration, chronic kidney disease stage IV chronic chronic stock CHF    Contact information:   [place] [address] [phone]        The results of significant diagnostics from this hospitalization (including imaging, microbiology, ancillary and laboratory) are listed below for reference.    Significant Diagnostic Studies: Dg Chest 2 View  08/18/2014   CLINICAL DATA:  Shortness of breath and wheezing.  EXAM: CHEST - 2 VIEW  COMPARISON:  CT chest without contrast 12/07/2013.  FINDINGS: The heart size is exaggerated by low lung volumes. Pulmonary interstitium is also exaggerate by low lung volumes. No focal airspace disease is present. There is no edema or effusion. A left shoulder hemiarthroplasty is noted.  IMPRESSION: 1. Low lung volumes. 2. No acute cardiopulmonary disease.   Electronically Signed   By: San Morelle M.D.   On: 08/18/2014 14:29   Dg Ankle Complete Left  08/18/2014   CLINICAL DATA:  Patient with swelling, pain and warmth to the left ankle. Worsening for several days. Initial encounter.  EXAM: LEFT ANKLE COMPLETE - 3+ VIEW  COMPARISON:  Radiograph 11/30/2011  FINDINGS: Re- demonstrated postoperative changes of arthrodesis at the tibiotalar joint. Status post resection of the distal fibula. No significant or obvious perihardware lucency. Soft tissue swelling about the ankle joint. No evidence for acute fracture. Hindfoot degenerative changes. Midfoot degenerative changes. Soft tissue calcifications.  IMPRESSION: Grossly stable appearing postoperative appearance of prior tibiotalar joint arthrodesis and distal fibular resection.  Soft tissue swelling.    Electronically Signed   By: Lovey Newcomer M.D.   On: 08/18/2014 14:32    Microbiology: Recent Results (from the past 240 hour(s))  Culture, blood (routine x 2)     Status: None (Preliminary result)   Collection Time: 08/18/14  1:34 PM  Result Value Ref Range Status   Specimen Description BLOOD RIGHT ANTECUBITAL  Final   Special Requests BOTTLES DRAWN AEROBIC AND  ANAEROBIC 5CC  Final   Culture NO GROWTH 3 DAYS  Final   Report Status PENDING  Incomplete  Culture, blood (routine x 2)     Status: None (Preliminary result)   Collection Time: 08/18/14  1:49 PM  Result Value Ref Range Status   Specimen Description BLOOD LEFT HAND  Final   Special Requests BOTTLES DRAWN AEROBIC AND ANAEROBIC 4CC  Final   Culture NO GROWTH 3 DAYS  Final   Report Status PENDING  Incomplete  Urine culture     Status: None   Collection Time: 08/18/14  3:27 PM  Result Value Ref Range Status   Specimen Description URINE, CLEAN CATCH  Final   Special Requests NONE  Final   Culture MULTIPLE SPECIES PRESENT, SUGGEST RECOLLECTION  Final   Report Status 08/20/2014 FINAL  Final     Labs: Basic Metabolic Panel:  Recent Labs Lab 08/18/14 1334 08/19/14 0518 08/20/14 0648 08/21/14 0409  NA 136 137 138 139  K 3.6 3.5 3.7 3.6  CL 99* 104 104 103  CO2 29 27 26 27   GLUCOSE 181* 173* 173* 184*  BUN 28* 32* 26* 24*  CREATININE 2.14* 2.30* 2.15* 2.11*  CALCIUM 8.8* 8.0* 8.4* 8.7*  MG  --   --  1.9 2.1   Liver Function Tests:  Recent Labs Lab 08/18/14 1334 08/19/14 0518 08/20/14 0648 08/21/14 0409  AST 16 14* 12* 12*  ALT 12* 11* 10* 11*  ALKPHOS 64 63 58 59  BILITOT 0.8 0.5 0.6 0.6  PROT 5.8* 5.2* 5.4* 5.7*  ALBUMIN 3.2* 2.6* 2.6* 2.7*   No results for input(s): LIPASE, AMYLASE in the last 168 hours. No results for input(s): AMMONIA in the last 168 hours. CBC:  Recent Labs Lab 08/18/14 1334 08/19/14 0518 08/20/14 0648 08/21/14 0409  WBC 16.1* 12.2* 9.0 8.7  NEUTROABS  --   --  6.4 5.6  HGB  12.8* 11.5* 11.5* 12.0*  HCT 37.1* 34.0* 34.0* 34.5*  MCV 87.3 88.3 88.3 88.9  PLT 180 172 186 210   Cardiac Enzymes: No results for input(s): CKTOTAL, CKMB, CKMBINDEX, TROPONINI in the last 168 hours. BNP: BNP (last 3 results)  Recent Labs  08/18/14 1654  BNP 563.2*    ProBNP (last 3 results)  Recent Labs  12/11/13 0320 12/12/13 0435 12/13/13 0320  PROBNP 2480.0* 3116.0* 3799.0*    CBG:  Recent Labs Lab 08/20/14 2036 08/21/14 0611 08/21/14 1128 08/21/14 1612 08/21/14 2050  GLUCAP 111* 155* 152* 202* 114*       Signed:  Dia Crawford, MD [facility] [phone] pager

## 2014-08-21 NOTE — Care Management Important Message (Signed)
Important Message  Patient Details  Name: [name] MRN: [id number] Date of Birth: November 28, 1930   Medicare Important Message Given:  Yes-second notification given    Pricilla Handler 08/21/2014, 2:53 PM

## 2014-08-21 NOTE — Progress Notes (Signed)
ANTIBIOTIC CONSULT NOTE - [place] for zosyn  Indication: rule out sepsis  No Known Allergies  Patient Measurements: Height: 5\' 8"  (172.7 cm) Weight: 230 lb 8 oz (104.554 kg) IBW/kg (Calculated) : 68.4  Vital Signs: Temp: 97.8 F (36.6 C) (08/08 0816) Temp Source: Oral (08/08 0816) BP: 156/78 mmHg (08/08 0816) Pulse Rate: 69 (08/08 0816) Intake/Output from previous day: 08/07 0701 - 08/08 0700 In: 1293 [P.O.:1140; I.V.:3; IV Piggyback:150] Out: 2251 [Urine:2250; Stool:1] Intake/Output from this shift: Total I/O In: 240 [P.O.:240] Out: -   Labs:  Recent Labs  08/19/14 0518 08/20/14 0648 08/21/14 0409  WBC 12.2* 9.0 8.7  HGB 11.5* 11.5* 12.0*  PLT 172 186 210  CREATININE 2.30* 2.15* 2.11*   Estimated Creatinine Clearance: 32.2 mL/min (by C-G formula based on Cr of 2.11). No results for input(s): VANCOTROUGH, VANCOPEAK, VANCORANDOM, GENTTROUGH, GENTPEAK, GENTRANDOM, TOBRATROUGH, TOBRAPEAK, TOBRARND, AMIKACINPEAK, AMIKACINTROU, AMIKACIN in the last 72 hours.   Microbiology: Recent Results (from the past 720 hour(s))  Culture, blood (routine x 2)     Status: None (Preliminary result)   Collection Time: 08/18/14  1:34 PM  Result Value Ref Range Status   Specimen Description BLOOD RIGHT ANTECUBITAL  Final   Special Requests BOTTLES DRAWN AEROBIC AND ANAEROBIC 5CC  Final   Culture NO GROWTH 2 DAYS  Final   Report Status PENDING  Incomplete  Culture, blood (routine x 2)     Status: None (Preliminary result)   Collection Time: 08/18/14  1:49 PM  Result Value Ref Range Status   Specimen Description BLOOD LEFT HAND  Final   Special Requests BOTTLES DRAWN AEROBIC AND ANAEROBIC 4CC  Final   Culture NO GROWTH 2 DAYS  Final   Report Status PENDING  Incomplete  Urine culture     Status: None   Collection Time: 08/18/14  3:27 PM  Result Value Ref Range Status   Specimen Description URINE, CLEAN CATCH  Final   Special Requests NONE  Final    Culture MULTIPLE SPECIES PRESENT, SUGGEST RECOLLECTION  Final   Report Status 08/20/2014 FINAL  Final    Medical History: Past Medical History  Diagnosis Date  . Anemia   . Hyperlipidemia     takes Pravastatin daily  . Hypertension     takes Cardizem,Proscar,and Lisinopril daily  . Peripheral neuropathy   . Joint pain   . Joint swelling   . GERD (gastroesophageal reflux disease)     takes Zantac daily   . Constipation     takes stool softener daily  . History of blood transfusion     "related to one of my ORs"  . Impaired hearing     wears hearing aids  . Type II diabetes mellitus     takes Metformin daily  . Rheumatoid arthritis     "all over; take orencia  q 4 wks" (02/01/2013)  . Atrial fibrillation 12/08/2013  . Cataract   . CHF (congestive heart failure)   . Chronic kidney disease     Medications:  Anti-infectives    Start     Dose/Rate Route Frequency Ordered Stop   08/19/14 1600  vancomycin (VANCOCIN) IVPB 1000 mg/200 mL premix  Status:  Discontinued     1,000 mg 200 mL/hr over 60 Minutes Intravenous Every 24 hours 08/18/14 1700 08/20/14 2006   08/19/14 0400  vancomycin (VANCOCIN) IVPB 1000 mg/200 mL premix  Status:  Discontinued     1,000 mg 200 mL/hr over 60 Minutes Intravenous Every 12  hours 08/18/14 1652 08/18/14 1700   08/18/14 2300  piperacillin-tazobactam (ZOSYN) IVPB 3.375 g     3.375 g 12.5 mL/hr over 240 Minutes Intravenous 3 times per day 08/18/14 1601     08/18/14 1500  vancomycin (VANCOCIN) 1,750 mg in sodium chloride 0.9 % 500 mL IVPB     1,750 mg 250 mL/hr over 120 Minutes Intravenous  Once 08/18/14 1445 08/18/14 1747   08/18/14 1445  piperacillin-tazobactam (ZOSYN) IVPB 3.375 g     3.375 g 100 mL/hr over 30 Minutes Intravenous  Once 08/18/14 1436 08/18/14 1546   08/18/14 1445  vancomycin (VANCOCIN) IVPB 1000 mg/200 mL premix  Status:  Discontinued     1,000 mg 200 mL/hr over 60 Minutes Intravenous  Once 08/18/14 1436 08/18/14 1445      Assessment: 81yo male admitted after a couple days of not feeling well, has a hx of COPD, AFib amongst other comorbidities.  Pharmacy initially consulted to dose vanc and zosyn to r/o sepsis, but vanc discontinued renal fxn and LLE cellulitis not convincing of requiring vanc.  Pt now afebrile, VSS, eCrCl ~ 32 mL/min, WBC 12.2 >> 8.7  8/5 BCx2 >> ngtd 8/5 UCx >> contaminated  Vanc 8/5 >> 87 Zosyn 8/5 >>  Plan:  Continue Zosyn 3.375g IV Q8h (4hr infusion) Monitor renal fxn, C&S, duration of therapy   Drucie Opitz, PharmD Clinical Pharmacist Pager: [phone] 08/21/2014 11:05 AM

## 2014-08-22 ENCOUNTER — Ambulatory Visit: Payer: PRIVATE HEALTH INSURANCE | Admitting: *Deleted

## 2014-08-22 LAB — CBC WITH DIFFERENTIAL/PLATELET
Basophils Absolute: 0.1 10*3/uL (ref 0.0–0.1)
Basophils Relative: 1 % (ref 0–1)
EOS PCT: 5 % (ref 0–5)
Eosinophils Absolute: 0.4 10*3/uL (ref 0.0–0.7)
HCT: 34.3 % — ABNORMAL LOW (ref 39.0–52.0)
HEMOGLOBIN: 11.5 g/dL — AB (ref 13.0–17.0)
Lymphocytes Relative: 24 % (ref 12–46)
Lymphs Abs: 2.1 10*3/uL (ref 0.7–4.0)
MCH: 29.3 pg (ref 26.0–34.0)
MCHC: 33.5 g/dL (ref 30.0–36.0)
MCV: 87.5 fL (ref 78.0–100.0)
Monocytes Absolute: 0.7 10*3/uL (ref 0.1–1.0)
Monocytes Relative: 8 % (ref 3–12)
Neutro Abs: 5.2 10*3/uL (ref 1.7–7.7)
Neutrophils Relative %: 62 % (ref 43–77)
Platelets: 216 10*3/uL (ref 150–400)
RBC: 3.92 MIL/uL — ABNORMAL LOW (ref 4.22–5.81)
RDW: 13.2 % (ref 11.5–15.5)
WBC: 8.4 10*3/uL (ref 4.0–10.5)

## 2014-08-22 LAB — GLUCOSE, CAPILLARY: GLUCOSE-CAPILLARY: 127 mg/dL — AB (ref 65–99)

## 2014-08-22 LAB — COMPREHENSIVE METABOLIC PANEL
ALT: 12 U/L — ABNORMAL LOW (ref 17–63)
AST: 13 U/L — AB (ref 15–41)
Albumin: 2.6 g/dL — ABNORMAL LOW (ref 3.5–5.0)
Alkaline Phosphatase: 56 U/L (ref 38–126)
Anion gap: 8 (ref 5–15)
BILIRUBIN TOTAL: 0.7 mg/dL (ref 0.3–1.2)
BUN: 22 mg/dL — AB (ref 6–20)
CALCIUM: 8.6 mg/dL — AB (ref 8.9–10.3)
CHLORIDE: 104 mmol/L (ref 101–111)
CO2: 27 mmol/L (ref 22–32)
Creatinine, Ser: 2.03 mg/dL — ABNORMAL HIGH (ref 0.61–1.24)
GFR, EST AFRICAN AMERICAN: 34 mL/min — AB (ref >60)
GFR, EST NON AFRICAN AMERICAN: 29 mL/min — AB (ref >60)
Glucose, Bld: 126 mg/dL — ABNORMAL HIGH (ref 65–99)
Potassium: 3.8 mmol/L (ref 3.5–5.1)
Sodium: 139 mmol/L (ref 135–145)
TOTAL PROTEIN: 5.6 g/dL — AB (ref 6.5–8.1)

## 2014-08-22 LAB — MAGNESIUM: MAGNESIUM: 2.1 mg/dL (ref 1.7–2.4)

## 2014-08-22 NOTE — Progress Notes (Signed)
Pt discharged home.  Discharge instructions completed.  Medication list reviewed with patient, son, daughter and wife. Med list understood.

## 2014-08-22 NOTE — Progress Notes (Signed)
Patient stable for discharge-- last dose of zosyn given this AM.  Eulogio Bear DO

## 2014-08-23 ENCOUNTER — Other Ambulatory Visit: Payer: Self-pay | Admitting: *Deleted

## 2014-08-23 LAB — CULTURE, BLOOD (ROUTINE X 2)
CULTURE: NO GROWTH
CULTURE: NO GROWTH

## 2014-08-23 NOTE — Patient Outreach (Signed)
[place] [facility]) Care Management  08/23/2014  [name] 1930/01/17 [id number]   Transition of Care telephone outreach Mr.Schartz was discharged from [facility] on 08/22/14. I spoke with patient today by telephone reports that he is doing okay. Mr.Shidler denies shortness of breath, increased temperature, his reported weight this am after his shower is 227, he has not checked his blood sugar this am yet Patient states that  his daughter is visiting him now, and they arranged family support to assist with his wife that he reports has alzheimer's. Mr.Viles reported that his daughter refilled his medication pill box based on the medications on his discharge instructions  on yesterday, he states that he is taking his medication as ordered. He has a scheduled appointment with Dr.Cox on 8/24 and will have transportation available if he does not drive himself.  I Reviewed my contact information in encouraged patient to call for concerns, notify MD of increased temperature, weight gain or shortness of breath.  Plan: Will schedule home visit on August 15 at 12 noon.  [name], RN, [place] Care Management [phone]

## 2014-08-28 ENCOUNTER — Other Ambulatory Visit: Payer: PRIVATE HEALTH INSURANCE | Admitting: *Deleted

## 2014-08-28 NOTE — Patient Outreach (Signed)
[place] [facility]) Care Management  08/28/2014  [name] November 27, 1930 [id number]   Arrived for scheduled home visit, per [name] and their daughter [name], Mr. Every is not at home at this time and will not return until later today. Will contact [name] on 8/16 to reschedule appointment.  [name], RN, [place] Care Management [phone]

## 2014-08-30 ENCOUNTER — Other Ambulatory Visit: Payer: Self-pay | Admitting: *Deleted

## 2014-08-30 NOTE — Patient Outreach (Signed)
[facility]) Care Management   08/30/2014  [name] 1930-12-22 [id number]  [name] is an 81 y.o. male  Subjective: Mr.Kotula reports that he feels good on today. Reports that he continues to weight every day and  his weight this morning is 220. He reports that he is taking his medication as prescribed, his daughter fills his pill box. Mr.Standiford denies pain or shortness of breath.  Objective:   Review of Systems  Constitutional: Negative.   HENT: Negative.   Eyes: Negative.   Respiratory: Negative.   Cardiovascular: Negative.   Gastrointestinal: Negative.   Genitourinary: Negative.   Musculoskeletal: Negative.   Skin: Negative.   Neurological: Negative.   Psychiatric/Behavioral: Negative.     Physical Exam  Constitutional: He is oriented to person, place, and time. He appears well-developed.  Cardiovascular: Normal rate and regular rhythm.   Respiratory: Effort normal and breath sounds normal.  GI: Soft.  Musculoskeletal:  Left ankle area larger than right,patient reports history of left ankle surgery. No edema, no increased redness  Neurological: He is alert and oriented to person, place, and time.  Skin: Skin is warm and dry.  Psychiatric: He has a normal mood and affect.   BP 134/80 mmHg  Pulse 69  Resp 20  SpO2 95% Current Medications:   Current Outpatient Prescriptions  Medication Sig Dispense Refill  . Abatacept (ORENCIA IV) Inject into the vein every 30 (thirty) days.    [name] atorvastatin (LIPITOR) 20 MG tablet Take 1 tablet (20 mg total) by mouth daily at 6 PM. 30 tablet 0  . cetirizine (ZYRTEC) 10 MG tablet Take 10 mg by mouth daily.    [name] ELIQUIS 2.5 MG TABS tablet Take 2.5 mg by mouth 2 (two) times daily.  12  . ferrous sulfate 325 (65 FE) MG tablet Take 325 mg by mouth 2 (two) times daily with a meal.    . finasteride (PROSCAR) 5 MG tablet Take 5 mg by mouth at bedtime.     . folic acid (FOLVITE) 1 MG tablet Take 1 mg by mouth daily.       [name] gabapentin (NEURONTIN) 300 MG capsule Take 1 capsule (300 mg total) by mouth 2 (two) times daily. 15 capsule 0  . levothyroxine (SYNTHROID, LEVOTHROID) 25 MCG tablet Take 25 mcg by mouth daily before breakfast.    . linagliptin (TRADJENTA) 5 MG TABS tablet Take 1 tablet (5 mg total) by mouth daily. 30 tablet 0  . metoprolol (LOPRESSOR) 50 MG tablet Take 25 mg by mouth 3 (three) times daily.    . ranitidine (ZANTAC) 150 MG tablet Take 150 mg by mouth 2 (two) times daily.      . Tamsulosin HCl (FLOMAX) 0.4 MG CAPS Take 0.4 mg by mouth daily after supper.     . traZODone (DESYREL) 50 MG tablet Take 50 mg by mouth at bedtime.    [name] HYDROcodone-acetaminophen (NORCO) 5-325 MG per tablet Take 1 tablet by mouth every 6 (six) hours as needed for moderate pain. (Patient not taking: Reported on 08/18/2014) 30 tablet 0  . mometasone (ASMANEX 120 METERED DOSES) 220 MCG/INH inhaler Inhale 2 puffs into the lungs daily. (Patient not taking: Reported on 08/01/2014) 1 Inhaler 12   No current facility-administered medications for this visit.    Functional Status:   In your present state of health, do you have any difficulty performing the following activities: 08/30/2014 08/19/2014  Hearing? N N  Vision? N N  Difficulty concentrating or making  decisions? N N  Walking or climbing stairs? Y -  Dressing or bathing? N N  Doing errands, shopping? N -  Preparing Food and eating ? N -  Using the Toilet? N -  In the past six months, have you accidently leaked urine? N -  Do you have problems with loss of bowel control? N -  Managing your Medications? N -  Managing your Finances? N -  Housekeeping or managing your Housekeeping? N -    Fall/Depression Screening:    PHQ 2/9 Scores 08/30/2014 07/10/2014  PHQ - 2 Score 0 0    Assessment:  Mr.Nipper states he weighs daily but does not write it down, weight at discharge from the hospital on 8/9 was 229, current weight today 220 per patient report. Encouraged Mr.Kliebert  with recording daily weights in [facility] calendar. Mr.Mainville called his daughter on the phone during the visit, and I was able to speak with her during reviewing medication, and she stated that she filled his pill box according to his discharge instruction medication list. Mr. Stencel has an appointment with his primary card doctor on 8/24 and states that his daughter usually goes with him at that appointment. Mr. Koeppen does not check his blood sugar on a regular basis, he did check during the visit reading was 182, encouraged to check blood sugar and keep a record of it. Reviewed my contact information with the patient, encouraged him to call for concerns. Reviewed to call RN, MD for increased weight gain, shortness of breath, fever concerns and 911 for emergency.   Plan:Will continue with transition of care program and place telephone call to patient on 8/25 or home visit as needed.  [facility] CM Care Plan Problem One        Patient Outreach from 08/30/2014 in [place] Problem One  Heart Failure self management care   Care Plan for Problem One  Active   THN Long Term Goal (31-90 days)  Patient will not be admitted with Heart Failure in the next 31 days   THN Long Term Goal Start Date  08/30/14 [name] date restarted pt admitted for sepsis 8/5]   Interventions for Problem One Long Term Goal  Discussed the importance of notifying provider of increase sob,swelling, weight gain of 5 pounds in 1 week or 3 pounds in a day   THN CM Short Term Goal #1 (0-30 days)  Patient will weigh and record daily in the next 30 days   THN CM Short Term Goal #1 Start Date  08/30/14   Interventions for Short Term Goal #1  review how write in thn calendar book, review importance   THN CM Short Term Goal #2 (0-30 days)  Patient will be able to state at least 2 yellow zone symptoms in the next 30 days   THN CM Short Term Goal #2 Start Date  08/01/14 [goal unmet restarted date]   [facility] CM Short Term Goal #2 Met  Date  08/30/14    [facility] CM Care Plan Problem Two        Patient Outreach from 08/30/2014 in [place] Problem Two  Diabetes self Management   Care Plan for Problem Two  Active   Interventions for Problem Two Long Term Goal   Allow patient to demostrate checking blood sugar, reviewed EMMI, taking care of self day to day   [facility] Long Term Goal (31-90) days  Patient will check blood sugar at  least 3 days per week and record in the next 31 days   THN Long Term Goal Start Date  08/01/14   THN CM Short Term Goal #1 (0-30 days)  Patient will  report cuttting back on starchs and sweets in the next 30 days    THN CM Short Term Goal #1 Start Date  08/30/14 [name] unmet restarted goal date]   Interventions for Short Term Goal #2   Reviewed EMMI handout on diabetes, diet     [name], [place], [place] Care Management [phone]

## 2014-08-31 ENCOUNTER — Encounter: Payer: Self-pay | Admitting: *Deleted

## 2014-09-07 ENCOUNTER — Other Ambulatory Visit: Payer: Self-pay | Admitting: *Deleted

## 2014-09-07 DIAGNOSIS — R5383 Other fatigue: Secondary | ICD-10-CM | POA: Diagnosis not present

## 2014-09-07 DIAGNOSIS — M0589 Other rheumatoid arthritis with rheumatoid factor of multiple sites: Secondary | ICD-10-CM | POA: Diagnosis not present

## 2014-09-07 NOTE — Patient Outreach (Signed)
[facility] [facility]) Care Management  09/07/2014  ZYSHONNE MALECHA Feb 12, 1930 [id number]   Transition of care call. Spoke with Mr.Wisner, reports that he is doing fine, going today to [facility] to get his arthritis infusion. Mr.Coronado reports that he missed his appointment with Dr.Cox on 8/24, due his wife that has alzheimer's having some medical issues and having to take her for an office visit. Discussed with Mr.Farnan regarding the importance of follow up with his doctor, states he will call this afternoon and reschedule appointment Mr. Violett reports taking all of his medications as prescribed, and weighing himself daily, today's weight 202 states he has been trying to lose weight , denies shortness of breath or increased swelling.  Denies any further needs or concerns at this time Plan: will continue with transition of care calls.[name], RN, [place] Care Management [phone]

## 2014-09-14 ENCOUNTER — Ambulatory Visit: Payer: PRIVATE HEALTH INSURANCE | Admitting: *Deleted

## 2014-09-15 ENCOUNTER — Other Ambulatory Visit: Payer: Self-pay | Admitting: *Deleted

## 2014-09-15 NOTE — Patient Outreach (Signed)
[facility]) Care Management  09/15/2014  [name] 1930-06-12 [id number]   Transition of care call  Subjective : Telephone call spoke with [name], reports that he is doing fine. Discussed his appointment on yesterday to get his hearing aides. Followed up with patient regarding whether he made his follow up appointment with Dr.Cox, as he said that he would do on last week, [name] stated that he had not made the appointment, I offered to make the appointment and he declined stated that he would do it.  [name] reports that he continues to weigh himself daily, but unable to recall weight at this time since he is not at home at present.Patient denies increased swelling, shortness of breath or pain. [name] states that he has not checked his blood sugar this week.Patient reports that he is taking medications daily,as his daughter has filled his pill box.  Plan: Notify Dr.Cox office to verify if [name] has an appointment scheduled, if not ask if they can reach out to him for post hospital follow up appointment. Placed phone call to office and staff member reports patient missed his 8/25 appointment and they will reach out to him for a follow up appointment. . Will plan follow up telephone outreach to patient on next week, then schedule appointment for next home visit.  [name], RN, [place] Care Management [phone].

## 2014-09-20 DIAGNOSIS — Z79899 Other long term (current) drug therapy: Secondary | ICD-10-CM | POA: Diagnosis not present

## 2014-09-20 DIAGNOSIS — M0589 Other rheumatoid arthritis with rheumatoid factor of multiple sites: Secondary | ICD-10-CM | POA: Diagnosis not present

## 2014-09-20 DIAGNOSIS — H1033 Unspecified acute conjunctivitis, bilateral: Secondary | ICD-10-CM | POA: Diagnosis not present

## 2014-09-21 ENCOUNTER — Other Ambulatory Visit: Payer: Self-pay | Admitting: *Deleted

## 2014-09-21 NOTE — Patient Outreach (Signed)
[facility]) Care Management  09/21/2014  NESTOR WIENEKE August 18, 1930 [id number]  Telephone call to Mr.Morton to follow regarding whether he has been able to make his post hospital primary care provider appointment. Subjective : Mr.Smolinski reports " I am fine, I'm not sick, he states that he is traveling, driving to [place] at the present time and will make the appointment after he returns home on Sunday. Plan:I will follow up with Mr.Geisel on next week.  [name], RN, [place] Care Management [phone]

## 2014-09-26 DIAGNOSIS — Z23 Encounter for immunization: Secondary | ICD-10-CM | POA: Diagnosis not present

## 2014-09-27 ENCOUNTER — Other Ambulatory Visit: Payer: Self-pay | Admitting: *Deleted

## 2014-09-27 NOTE — Patient Outreach (Signed)
[place] [facility]) Care Management  09/27/2014  [name] [birth year] [id number]   Subjective: Mr.[name] reports that he is doing fine, blood sugar 132 this am, and weight 222. He denies chest pain or shortness of breath or swelling. Mr.[name] reports that he has an appointment with Dr.Cox on September 22.Patient states that he is taking his medication daily as his daughter has filled his pill box.  Assessment: Diabetes: Patient reports that the checks his blood sugar twice weekly Heart Failure: Weight stable with no weight gain, he continues to daily weights,   Plan: Schedule home visit for September 26 at 1000.

## 2014-10-05 DIAGNOSIS — M069 Rheumatoid arthritis, unspecified: Secondary | ICD-10-CM | POA: Diagnosis not present

## 2014-10-05 DIAGNOSIS — L03116 Cellulitis of left lower limb: Secondary | ICD-10-CM | POA: Diagnosis not present

## 2014-10-05 DIAGNOSIS — L03119 Cellulitis of unspecified part of limb: Secondary | ICD-10-CM | POA: Diagnosis not present

## 2014-10-05 DIAGNOSIS — E782 Mixed hyperlipidemia: Secondary | ICD-10-CM | POA: Diagnosis not present

## 2014-10-05 DIAGNOSIS — M0589 Other rheumatoid arthritis with rheumatoid factor of multiple sites: Secondary | ICD-10-CM | POA: Diagnosis not present

## 2014-10-05 DIAGNOSIS — A419 Sepsis, unspecified organism: Secondary | ICD-10-CM | POA: Diagnosis not present

## 2014-10-05 DIAGNOSIS — E1165 Type 2 diabetes mellitus with hyperglycemia: Secondary | ICD-10-CM | POA: Diagnosis not present

## 2014-10-05 DIAGNOSIS — I10 Essential (primary) hypertension: Secondary | ICD-10-CM | POA: Diagnosis not present

## 2014-10-05 DIAGNOSIS — N400 Enlarged prostate without lower urinary tract symptoms: Secondary | ICD-10-CM | POA: Diagnosis not present

## 2014-10-05 DIAGNOSIS — E780 Pure hypercholesterolemia: Secondary | ICD-10-CM | POA: Diagnosis not present

## 2014-10-09 ENCOUNTER — Ambulatory Visit: Payer: PRIVATE HEALTH INSURANCE | Admitting: *Deleted

## 2014-10-09 ENCOUNTER — Other Ambulatory Visit: Payer: Self-pay | Admitting: *Deleted

## 2014-10-09 NOTE — Patient Outreach (Signed)
[place] [facility]) Care Management  10/09/2014  [name] January 17, 1930 [id number]   Arrived at patient home for routine scheduled visit, [name], wife greeted me at the door and stated that he was out to breakfast.  I placed a phone call to [place], he stated that he forgot about appointment, declined to reschedule visit for this month, requested home visit for October.    Assessment  Congestive Heart Failure :[name] reports that he is watching his diet and that his weight is down to 217. Denies swelling or shortness of breath.Reports taking his medication as prescribed,and his daughter fills his pill box per medication list. Patient reports that he had a recent office visit with Dr. Tobie Poet.  Diabetes: [name] reports checking his blood sugar a few times a week , reports that it is" normal  Plan: Schedule home visit for October   THN CM Care Plan Problem One        Most Recent Value   Care Plan Problem One  Heart Failure self management care   Role Documenting the Problem One  Care Management [place] for Problem One  Active   THN Long Term Goal (31-90 days)  Patient will not be admitted with Heart Failure in the next 31 days   THN Long Term Goal Start Date  08/30/14 [name] date restarted pt admitted for sepsis 8/5]   THN Long Term Goal Met Date  10/09/14   THN CM Short Term Goal #1 (0-30 days)  Patient will weigh and record daily in the next 30 days   THN CM Short Term Goal #1 Start Date  08/30/14 [patient reports weighing but not writing down,restarted]   Interventions for Short Term Goal #1  Encouraged patient to write weights down   THN CM Short Term Goal #2 (0-30 days)  Patient will be able to state at least 2 yellow zone symptoms in the next 30 days   THN CM Short Term Goal #2 Start Date  08/01/14 [name] unmet restarted date]   [facility] CM Short Term Goal #2 Met Date  08/30/14    [facility] CM Care Plan Problem Two        Most Recent Value   Care Plan Problem  Two  Diabetes self Management   Role Documenting the Problem Two  Care Management Coordinator   Care Plan for Problem Two  Active   Interventions for Problem Two Long Term Goal   Explained importance of knowing blood sugars, and the importance of checking while on medication   THN Long Term Goal (31-90) days  Patient will check blood sugar at least 3 days per week and record in the next 31 days   THN Long Term Goal Start Date  08/30/14 [goal date restarted]   [facility] CM Short Term Goal #1 (0-30 days)  Patient will  report cuttting back on starchs and sweets in the next 30 days    THN CM Short Term Goal #1 Start Date  08/30/14 [goal unmet restarted goal date]   [facility] CM Short Term Goal #1 Met Date   10/09/14      [name], [place], [place] Care Management [phone]

## 2014-10-18 DIAGNOSIS — I129 Hypertensive chronic kidney disease with stage 1 through stage 4 chronic kidney disease, or unspecified chronic kidney disease: Secondary | ICD-10-CM | POA: Diagnosis not present

## 2014-10-18 DIAGNOSIS — E1129 Type 2 diabetes mellitus with other diabetic kidney complication: Secondary | ICD-10-CM | POA: Diagnosis not present

## 2014-10-18 DIAGNOSIS — N179 Acute kidney failure, unspecified: Secondary | ICD-10-CM | POA: Diagnosis not present

## 2014-10-18 DIAGNOSIS — N184 Chronic kidney disease, stage 4 (severe): Secondary | ICD-10-CM | POA: Diagnosis not present

## 2014-10-24 ENCOUNTER — Other Ambulatory Visit: Payer: Self-pay | Admitting: *Deleted

## 2014-10-24 DIAGNOSIS — M5136 Other intervertebral disc degeneration, lumbar region: Secondary | ICD-10-CM | POA: Diagnosis not present

## 2014-10-24 DIAGNOSIS — M47816 Spondylosis without myelopathy or radiculopathy, lumbar region: Secondary | ICD-10-CM | POA: Diagnosis not present

## 2014-10-24 DIAGNOSIS — M4856XA Collapsed vertebra, not elsewhere classified, lumbar region, initial encounter for fracture: Secondary | ICD-10-CM | POA: Diagnosis not present

## 2014-10-24 DIAGNOSIS — M545 Low back pain: Secondary | ICD-10-CM | POA: Diagnosis not present

## 2014-10-24 DIAGNOSIS — M549 Dorsalgia, unspecified: Secondary | ICD-10-CM | POA: Diagnosis not present

## 2014-10-24 NOTE — Patient Outreach (Signed)
[facility] [facility]) Care Management   10/24/2014  YUSUKE BEZA 27-Oct-1930 [id number]  DAELYN MOZER is an 81 y.o. male  Subjective: "I'm doing good'  Objective:   Review of Systems  Constitutional: Negative.   HENT: Negative.   Respiratory: Negative.   Cardiovascular: Negative.  Negative for chest pain.  Gastrointestinal: Negative.   Musculoskeletal: Negative.   Skin: Negative.   Psychiatric/Behavioral: Negative.     Physical Exam  Constitutional: He is oriented to person, place, and time. He appears well-developed and well-nourished.  Cardiovascular: Normal rate and regular rhythm.   Respiratory: Effort normal and breath sounds normal.  Neurological: He is oriented to person, place, and time.  Skin: Skin is warm and dry.  Psychiatric: He has a normal mood and affect. His behavior is normal. Judgment and thought content normal.   BP 138/78 mmHg  Pulse 87  Resp 20  Wt 220 lb (99.791 kg)  SpO2 95% Current Medications:   Current Outpatient Prescriptions  Medication Sig Dispense Refill  . Abatacept (ORENCIA IV) Inject into the vein every 30 (thirty) days.    [name] atorvastatin (LIPITOR) 20 MG tablet Take 1 tablet (20 mg total) by mouth daily at 6 PM. 30 tablet 0  . cetirizine (ZYRTEC) 10 MG tablet Take 10 mg by mouth daily.    [name] ELIQUIS 2.5 MG TABS tablet Take 2.5 mg by mouth 2 (two) times daily.  12  . ferrous sulfate 325 (65 FE) MG tablet Take 325 mg by mouth 2 (two) times daily with a meal.    . finasteride (PROSCAR) 5 MG tablet Take 5 mg by mouth at bedtime.     . folic acid (FOLVITE) 1 MG tablet Take 1 mg by mouth daily.      [name] gabapentin (NEURONTIN) 300 MG capsule Take 1 capsule (300 mg total) by mouth 2 (two) times daily. 15 capsule 0  . HYDROcodone-acetaminophen (NORCO) 5-325 MG per tablet Take 1 tablet by mouth every 6 (six) hours as needed for moderate pain. 30 tablet 0  . Insulin Glargine (TOUJEO SOLOSTAR) 300 UNIT/ML SOPN Inject 10 Units into the skin at  bedtime.     [name] levothyroxine (SYNTHROID, LEVOTHROID) 25 MCG tablet Take 25 mcg by mouth daily before breakfast.    . metoprolol (LOPRESSOR) 50 MG tablet Take 25 mg by mouth 3 (three) times daily.    . ranitidine (ZANTAC) 150 MG tablet Take 150 mg by mouth 2 (two) times daily.      . Tamsulosin HCl (FLOMAX) 0.4 MG CAPS Take 0.4 mg by mouth daily after supper.     . traZODone (DESYREL) 50 MG tablet Take 50 mg by mouth at bedtime.    [name] linagliptin (TRADJENTA) 5 MG TABS tablet Take 1 tablet (5 mg total) by mouth daily. (Patient not taking: Reported on 10/24/2014) 30 tablet 0  . mometasone (ASMANEX 120 METERED DOSES) 220 MCG/INH inhaler Inhale 2 puffs into the lungs daily. (Patient not taking: Reported on 08/01/2014) 1 Inhaler 12   No current facility-administered medications for this visit.    Functional Status:   In your present state of health, do you have any difficulty performing the following activities: 10/24/2014 08/30/2014  Hearing? N N  Vision? N N  Difficulty concentrating or making decisions? N N  Walking or climbing stairs? N Y  Dressing or bathing? N N  Doing errands, shopping? N N  Preparing Food and eating ? N N  Using the Toilet? N N  In the past  six months, have you accidently leaked urine? N N  Do you have problems with loss of bowel control? N N  Managing your Medications? N N  Managing your Finances? N N  Housekeeping or managing your Housekeeping? N N    Fall/Depression Screening:    PHQ 2/9 Scores 10/24/2014 08/30/2014 07/10/2014  PHQ - 2 Score 0 0 0    Assessment:   Chronic Disease : CHF Patient reports taking his medication daily as prescribed, reports that his daughter prepares his pill box daily, using current medication list, she recently attended PCP office visit with patient. Patient weighs daily and is able to recall weighs denies weight increases greater than 3 pounds.and is able to verbalize action plan if weight increases greater than 3 pounds overnight or  5 pounds in a week. Mr.Swisher does not record daily weights but reports he weighs daily after his shower.   Chronic Disease : Diabetes Mr.Bachmeier has been started on [place] at his recent office visit. Patient reports that he has been administering insulin nightly without problems. Reviewed his current dose of 10 units nightly and patient is able to verbalize steps that he uses to administer insulin. Mr.Herrod reports to me that he checks his blood sugar 2 to 3 times a week, and states his blood sugar is always around 150 but he does not keep a record of reading , his most recent A1 c result is 7.2. Mr. 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  Plan:  Review patient care goals and progress, discuss case closure as patient  nearing goal met status Will schedule a  visit for next month, Patient will continue to weigh daily, and check blood sugar at least 2 to 3 times per week   [facility] CM Care Plan Problem One        Most Recent Value   Care Plan Problem One  Heart Failure self management care   Role Documenting the Problem One  Care Management [place] for Problem One  Active   THN Long Term Goal (31-90 days)  Patient will not be admitted with Heart Failure in the next 31 days   THN Long Term Goal Start Date  08/30/14 [name] date restarted pt admitted for sepsis 8/5]   THN Long Term Goal Met Date  10/09/14   THN CM Short Term Goal #1 (0-30 days)  Patient will weigh and record daily in the next 30 days   THN CM Short Term Goal #1 Start Date  08/30/14 [patient reports weighing but not writing down,restarted]   Interventions for Short Term Goal #1  Discussed importance of continuing to weigh daily at the  same time, as well as keeping a record of weights as a visual and a way to follow weight trends   THN CM Short Term Goal #2 (0-30 days)  Patient will be able to state at least 2 yellow zone symptoms in the next 30 days   THN CM Short Term Goal #2 Start Date  08/01/14 [goal unmet restarted date]   [facility] CM Short Term Goal #2 Met Date  08/30/14    [facility] CM Care Plan Problem Two  Most Recent Value   Care Plan Problem Two  Diabetes self Management   Role Documenting the Problem Two  Care Management Coordinator   Care Plan for Problem Two  Active   THN Long Term Goal (31-90) days  Patient will check blood sugar at least 3 days per week and record in the next 31 days   THN Long Term Goal Start Date  08/30/14 [goal date restarted]   [facility] Long Term Goal Met Date  10/24/14 [per patient report he checks blood sugar 2 to 3 times a week]   THN CM Short Term Goal #1 (0-30 days)  Patient will  report cuttting back on starchs and sweets in the next 30 days    THN CM Short Term Goal #1 Start Date  08/30/14 [goal unmet restarted goal date]   [facility] CM Short Term Goal #1 Met Date   10/09/14     [name], RN, [place] Management [phone]- Mobile [phone]- [place] Office

## 2014-11-02 DIAGNOSIS — M0589 Other rheumatoid arthritis with rheumatoid factor of multiple sites: Secondary | ICD-10-CM | POA: Diagnosis not present

## 2014-11-09 DIAGNOSIS — M170 Bilateral primary osteoarthritis of knee: Secondary | ICD-10-CM | POA: Diagnosis not present

## 2014-11-09 DIAGNOSIS — M545 Low back pain: Secondary | ICD-10-CM | POA: Diagnosis not present

## 2014-11-09 DIAGNOSIS — M0579 Rheumatoid arthritis with rheumatoid factor of multiple sites without organ or systems involvement: Secondary | ICD-10-CM | POA: Diagnosis not present

## 2014-11-09 DIAGNOSIS — M2550 Pain in unspecified joint: Secondary | ICD-10-CM | POA: Diagnosis not present

## 2014-11-09 DIAGNOSIS — Z79899 Other long term (current) drug therapy: Secondary | ICD-10-CM | POA: Diagnosis not present

## 2014-11-27 ENCOUNTER — Other Ambulatory Visit: Payer: Self-pay | Admitting: *Deleted

## 2014-11-27 NOTE — Patient Outreach (Signed)
[name] [name]) Care Management  [name]  11/27/2014   [name] 09-10-1930 [id number]  Subjective: "I am doing great". [name] discussed that he is doing well, still very independent.Patient reports that he weighs about 3 to 4 days a week , checks his blood sugar once per week, and still taking his medication and insulin as prescribed.Denies episode of increased fluid weight, or hypoglycemia. [name] request to end the [facility] care management program including phone calls and home visit.  Objective:   Current Medications:  Current Outpatient Prescriptions  Medication Sig Dispense Refill  . Abatacept (ORENCIA IV) Inject into the vein every 30 (thirty) days.    [name] atorvastatin (LIPITOR) 20 MG tablet Take 1 tablet (20 mg total) by mouth daily at 6 PM. 30 tablet 0  . cetirizine (ZYRTEC) 10 MG tablet Take 10 mg by mouth daily.    [name] ELIQUIS 2.5 MG TABS tablet Take 2.5 mg by mouth 2 (two) times daily.  12  . ferrous sulfate 325 (65 FE) MG tablet Take 325 mg by mouth 2 (two) times daily with a meal.    . finasteride (PROSCAR) 5 MG tablet Take 5 mg by mouth at bedtime.     . folic acid (FOLVITE) 1 MG tablet Take 1 mg by mouth daily.      [name] gabapentin (NEURONTIN) 300 MG capsule Take 1 capsule (300 mg total) by mouth 2 (two) times daily. 15 capsule 0  . HYDROcodone-acetaminophen (NORCO) 5-325 MG per tablet Take 1 tablet by mouth every 6 (six) hours as needed for moderate pain. 30 tablet 0  . Insulin Glargine (TOUJEO SOLOSTAR) 300 UNIT/ML SOPN Inject 10 Units into the skin at bedtime.     [name] levothyroxine (SYNTHROID, LEVOTHROID) 25 MCG tablet Take 25 mcg by mouth daily before breakfast.    . linagliptin (TRADJENTA) 5 MG TABS tablet Take 1 tablet (5 mg total) by mouth daily. (Patient not taking: Reported on 10/24/2014) 30 tablet 0  . metoprolol (LOPRESSOR) 50 MG tablet Take 25 mg by mouth 3 (three) times daily.    . mometasone (ASMANEX 120 METERED DOSES) 220 MCG/INH inhaler  Inhale 2 puffs into the lungs daily. (Patient not taking: Reported on 08/01/2014) 1 Inhaler 12  . ranitidine (ZANTAC) 150 MG tablet Take 150 mg by mouth 2 (two) times daily.      . Tamsulosin HCl (FLOMAX) 0.4 MG CAPS Take 0.4 mg by mouth daily after supper.     . traZODone (DESYREL) 50 MG tablet Take 50 mg by mouth at bedtime.     No current facility-administered medications for this visit.    Functional Status:  In your present state of health, do you have any difficulty performing the following activities: 10/24/2014 08/30/2014  Hearing? N N  Vision? N N  Difficulty concentrating or making decisions? N N  Walking or climbing stairs? N Y  Dressing or bathing? N N  Doing errands, shopping? N N  Preparing Food and eating ? N N  Using the Toilet? N N  In the past six months, have you accidently leaked urine? N N  Do you have problems with loss of bowel control? N N  Managing your Medications? N N  Managing your Finances? N N  Housekeeping or managing your Housekeeping? N N    Fall/Depression Screening: PHQ 2/9 Scores 10/24/2014 08/30/2014 07/10/2014  PHQ - 2 Score 0 0 0    Assessment:  Patient has stated that he is able to managing  his care at this time and states he is   no longer in need of [facility] community care management services ,explained to patient that  if he is in need of [facility] services to contact the [facility] office or his primary care doctor.  Heart Failure: Patient indicates that he is doing well and in the green zone.   Plan:  Encouraged patient regarding importance of continuing to weigh on a regular basis and to report weight increases greater than 3 pounds overnight or 5 pounds in a week, symptoms of shortness of breath,and to seek medical attention   II I stressed the importance of taking medication as prescribed( his daughter continues to fill his pill box) Discussed the importance of checking his blood sugar daily and reports consistent high or low blood sugars <,70 or >200  to MD. Patient able to states symptoms of hypoglycemia and action to take. Patient denies any further questions or concerns at this time. This case will be closed. Will send letter to PCP, and alert Lurline Del, CMA, at [facility].  [name], RN, [facility] [phone]- Mobile [phone]- Toll Free Main Office

## 2014-12-01 DIAGNOSIS — H6063 Unspecified chronic otitis externa, bilateral: Secondary | ICD-10-CM | POA: Diagnosis not present

## 2014-12-01 DIAGNOSIS — H903 Sensorineural hearing loss, bilateral: Secondary | ICD-10-CM | POA: Diagnosis not present

## 2014-12-04 DIAGNOSIS — M0589 Other rheumatoid arthritis with rheumatoid factor of multiple sites: Secondary | ICD-10-CM | POA: Diagnosis not present

## 2014-12-05 ENCOUNTER — Encounter: Payer: Self-pay | Admitting: *Deleted

## 2015-01-04 DIAGNOSIS — M0589 Other rheumatoid arthritis with rheumatoid factor of multiple sites: Secondary | ICD-10-CM | POA: Diagnosis not present

## 2015-01-14 DIAGNOSIS — J189 Pneumonia, unspecified organism: Secondary | ICD-10-CM

## 2015-01-14 HISTORY — DX: Pneumonia, unspecified organism: J18.9

## 2015-01-24 DIAGNOSIS — L300 Nummular dermatitis: Secondary | ICD-10-CM | POA: Diagnosis not present

## 2015-01-29 ENCOUNTER — Other Ambulatory Visit: Payer: Self-pay | Admitting: Pharmacist

## 2015-01-31 DIAGNOSIS — R339 Retention of urine, unspecified: Secondary | ICD-10-CM | POA: Diagnosis not present

## 2015-01-31 DIAGNOSIS — N2581 Secondary hyperparathyroidism of renal origin: Secondary | ICD-10-CM | POA: Diagnosis not present

## 2015-01-31 DIAGNOSIS — D638 Anemia in other chronic diseases classified elsewhere: Secondary | ICD-10-CM | POA: Diagnosis not present

## 2015-01-31 DIAGNOSIS — N179 Acute kidney failure, unspecified: Secondary | ICD-10-CM | POA: Diagnosis not present

## 2015-01-31 DIAGNOSIS — I4891 Unspecified atrial fibrillation: Secondary | ICD-10-CM | POA: Diagnosis not present

## 2015-01-31 DIAGNOSIS — N184 Chronic kidney disease, stage 4 (severe): Secondary | ICD-10-CM | POA: Diagnosis not present

## 2015-01-31 DIAGNOSIS — I129 Hypertensive chronic kidney disease with stage 1 through stage 4 chronic kidney disease, or unspecified chronic kidney disease: Secondary | ICD-10-CM | POA: Diagnosis not present

## 2015-01-31 DIAGNOSIS — E1129 Type 2 diabetes mellitus with other diabetic kidney complication: Secondary | ICD-10-CM | POA: Diagnosis not present

## 2015-01-31 DIAGNOSIS — I509 Heart failure, unspecified: Secondary | ICD-10-CM | POA: Diagnosis not present

## 2015-02-01 DIAGNOSIS — Z79899 Other long term (current) drug therapy: Secondary | ICD-10-CM | POA: Diagnosis not present

## 2015-02-01 DIAGNOSIS — M0589 Other rheumatoid arthritis with rheumatoid factor of multiple sites: Secondary | ICD-10-CM | POA: Diagnosis not present

## 2015-02-19 DIAGNOSIS — R0989 Other specified symptoms and signs involving the circulatory and respiratory systems: Secondary | ICD-10-CM | POA: Diagnosis not present

## 2015-02-19 DIAGNOSIS — I480 Paroxysmal atrial fibrillation: Secondary | ICD-10-CM | POA: Diagnosis not present

## 2015-02-19 DIAGNOSIS — I5022 Chronic systolic (congestive) heart failure: Secondary | ICD-10-CM | POA: Diagnosis not present

## 2015-02-19 DIAGNOSIS — N184 Chronic kidney disease, stage 4 (severe): Secondary | ICD-10-CM | POA: Diagnosis not present

## 2015-03-02 DIAGNOSIS — M0589 Other rheumatoid arthritis with rheumatoid factor of multiple sites: Secondary | ICD-10-CM | POA: Diagnosis not present

## 2015-03-12 DIAGNOSIS — M0579 Rheumatoid arthritis with rheumatoid factor of multiple sites without organ or systems involvement: Secondary | ICD-10-CM | POA: Diagnosis not present

## 2015-03-12 DIAGNOSIS — M170 Bilateral primary osteoarthritis of knee: Secondary | ICD-10-CM | POA: Diagnosis not present

## 2015-03-12 DIAGNOSIS — M545 Low back pain: Secondary | ICD-10-CM | POA: Diagnosis not present

## 2015-03-12 DIAGNOSIS — Z79899 Other long term (current) drug therapy: Secondary | ICD-10-CM | POA: Diagnosis not present

## 2015-03-12 DIAGNOSIS — M2550 Pain in unspecified joint: Secondary | ICD-10-CM | POA: Diagnosis not present

## 2015-03-26 DIAGNOSIS — L821 Other seborrheic keratosis: Secondary | ICD-10-CM | POA: Diagnosis not present

## 2015-03-26 DIAGNOSIS — B351 Tinea unguium: Secondary | ICD-10-CM | POA: Diagnosis not present

## 2015-03-26 DIAGNOSIS — L300 Nummular dermatitis: Secondary | ICD-10-CM | POA: Diagnosis not present

## 2015-04-03 DIAGNOSIS — M0589 Other rheumatoid arthritis with rheumatoid factor of multiple sites: Secondary | ICD-10-CM | POA: Diagnosis not present

## 2015-05-01 DIAGNOSIS — M0589 Other rheumatoid arthritis with rheumatoid factor of multiple sites: Secondary | ICD-10-CM | POA: Diagnosis not present

## 2015-05-24 DIAGNOSIS — D638 Anemia in other chronic diseases classified elsewhere: Secondary | ICD-10-CM | POA: Diagnosis not present

## 2015-05-24 DIAGNOSIS — N184 Chronic kidney disease, stage 4 (severe): Secondary | ICD-10-CM | POA: Diagnosis not present

## 2015-05-24 DIAGNOSIS — R339 Retention of urine, unspecified: Secondary | ICD-10-CM | POA: Diagnosis not present

## 2015-05-24 DIAGNOSIS — N390 Urinary tract infection, site not specified: Secondary | ICD-10-CM | POA: Diagnosis not present

## 2015-05-24 DIAGNOSIS — E1129 Type 2 diabetes mellitus with other diabetic kidney complication: Secondary | ICD-10-CM | POA: Diagnosis not present

## 2015-05-24 DIAGNOSIS — I509 Heart failure, unspecified: Secondary | ICD-10-CM | POA: Diagnosis not present

## 2015-05-24 DIAGNOSIS — I4891 Unspecified atrial fibrillation: Secondary | ICD-10-CM | POA: Diagnosis not present

## 2015-05-24 DIAGNOSIS — N2581 Secondary hyperparathyroidism of renal origin: Secondary | ICD-10-CM | POA: Diagnosis not present

## 2015-05-24 DIAGNOSIS — N179 Acute kidney failure, unspecified: Secondary | ICD-10-CM | POA: Diagnosis not present

## 2015-05-24 DIAGNOSIS — I129 Hypertensive chronic kidney disease with stage 1 through stage 4 chronic kidney disease, or unspecified chronic kidney disease: Secondary | ICD-10-CM | POA: Diagnosis not present

## 2015-05-29 DIAGNOSIS — M0589 Other rheumatoid arthritis with rheumatoid factor of multiple sites: Secondary | ICD-10-CM | POA: Diagnosis not present

## 2015-06-20 DIAGNOSIS — H353131 Nonexudative age-related macular degeneration, bilateral, early dry stage: Secondary | ICD-10-CM | POA: Diagnosis not present

## 2015-06-20 DIAGNOSIS — H524 Presbyopia: Secondary | ICD-10-CM | POA: Diagnosis not present

## 2015-06-20 DIAGNOSIS — I10 Essential (primary) hypertension: Secondary | ICD-10-CM | POA: Diagnosis not present

## 2015-06-20 DIAGNOSIS — H52223 Regular astigmatism, bilateral: Secondary | ICD-10-CM | POA: Diagnosis not present

## 2015-06-20 DIAGNOSIS — H35373 Puckering of macula, bilateral: Secondary | ICD-10-CM | POA: Diagnosis not present

## 2015-06-20 DIAGNOSIS — Z9842 Cataract extraction status, left eye: Secondary | ICD-10-CM | POA: Diagnosis not present

## 2015-06-20 DIAGNOSIS — Z961 Presence of intraocular lens: Secondary | ICD-10-CM | POA: Diagnosis not present

## 2015-06-20 DIAGNOSIS — H5203 Hypermetropia, bilateral: Secondary | ICD-10-CM | POA: Diagnosis not present

## 2015-06-20 DIAGNOSIS — Z9841 Cataract extraction status, right eye: Secondary | ICD-10-CM | POA: Diagnosis not present

## 2015-06-20 DIAGNOSIS — E119 Type 2 diabetes mellitus without complications: Secondary | ICD-10-CM | POA: Diagnosis not present

## 2015-06-26 DIAGNOSIS — M0589 Other rheumatoid arthritis with rheumatoid factor of multiple sites: Secondary | ICD-10-CM | POA: Diagnosis not present

## 2015-07-13 DIAGNOSIS — M170 Bilateral primary osteoarthritis of knee: Secondary | ICD-10-CM | POA: Diagnosis not present

## 2015-07-13 DIAGNOSIS — Z79899 Other long term (current) drug therapy: Secondary | ICD-10-CM | POA: Diagnosis not present

## 2015-07-13 DIAGNOSIS — M0579 Rheumatoid arthritis with rheumatoid factor of multiple sites without organ or systems involvement: Secondary | ICD-10-CM | POA: Diagnosis not present

## 2015-07-13 DIAGNOSIS — M2550 Pain in unspecified joint: Secondary | ICD-10-CM | POA: Diagnosis not present

## 2015-07-13 DIAGNOSIS — M545 Low back pain: Secondary | ICD-10-CM | POA: Diagnosis not present

## 2015-07-26 DIAGNOSIS — Z79899 Other long term (current) drug therapy: Secondary | ICD-10-CM | POA: Diagnosis not present

## 2015-07-26 DIAGNOSIS — M0589 Other rheumatoid arthritis with rheumatoid factor of multiple sites: Secondary | ICD-10-CM | POA: Diagnosis not present

## 2015-08-07 DIAGNOSIS — R1319 Other dysphagia: Secondary | ICD-10-CM | POA: Diagnosis not present

## 2015-08-07 DIAGNOSIS — R918 Other nonspecific abnormal finding of lung field: Secondary | ICD-10-CM | POA: Diagnosis not present

## 2015-08-07 DIAGNOSIS — M05741 Rheumatoid arthritis with rheumatoid factor of right hand without organ or systems involvement: Secondary | ICD-10-CM | POA: Diagnosis not present

## 2015-08-07 DIAGNOSIS — E782 Mixed hyperlipidemia: Secondary | ICD-10-CM | POA: Diagnosis not present

## 2015-08-07 DIAGNOSIS — L408 Other psoriasis: Secondary | ICD-10-CM | POA: Diagnosis not present

## 2015-08-07 DIAGNOSIS — D631 Anemia in chronic kidney disease: Secondary | ICD-10-CM | POA: Diagnosis not present

## 2015-08-07 DIAGNOSIS — J158 Pneumonia due to other specified bacteria: Secondary | ICD-10-CM | POA: Diagnosis not present

## 2015-08-07 DIAGNOSIS — M05742 Rheumatoid arthritis with rheumatoid factor of left hand without organ or systems involvement: Secondary | ICD-10-CM | POA: Diagnosis not present

## 2015-08-07 DIAGNOSIS — N184 Chronic kidney disease, stage 4 (severe): Secondary | ICD-10-CM | POA: Diagnosis not present

## 2015-08-07 DIAGNOSIS — R0602 Shortness of breath: Secondary | ICD-10-CM | POA: Diagnosis not present

## 2015-08-07 DIAGNOSIS — I10 Essential (primary) hypertension: Secondary | ICD-10-CM | POA: Diagnosis not present

## 2015-08-07 DIAGNOSIS — E1165 Type 2 diabetes mellitus with hyperglycemia: Secondary | ICD-10-CM | POA: Diagnosis not present

## 2015-08-07 DIAGNOSIS — N400 Enlarged prostate without lower urinary tract symptoms: Secondary | ICD-10-CM | POA: Diagnosis not present

## 2015-08-21 DIAGNOSIS — I480 Paroxysmal atrial fibrillation: Secondary | ICD-10-CM | POA: Diagnosis not present

## 2015-08-21 DIAGNOSIS — I5022 Chronic systolic (congestive) heart failure: Secondary | ICD-10-CM | POA: Diagnosis not present

## 2015-08-21 DIAGNOSIS — N184 Chronic kidney disease, stage 4 (severe): Secondary | ICD-10-CM | POA: Diagnosis not present

## 2015-08-21 DIAGNOSIS — R0989 Other specified symptoms and signs involving the circulatory and respiratory systems: Secondary | ICD-10-CM | POA: Diagnosis not present

## 2015-08-23 DIAGNOSIS — M0589 Other rheumatoid arthritis with rheumatoid factor of multiple sites: Secondary | ICD-10-CM | POA: Diagnosis not present

## 2015-09-04 DIAGNOSIS — L03116 Cellulitis of left lower limb: Secondary | ICD-10-CM | POA: Diagnosis not present

## 2015-09-04 DIAGNOSIS — L03119 Cellulitis of unspecified part of limb: Secondary | ICD-10-CM | POA: Diagnosis not present

## 2015-09-04 DIAGNOSIS — E119 Type 2 diabetes mellitus without complications: Secondary | ICD-10-CM | POA: Diagnosis not present

## 2015-09-04 DIAGNOSIS — L408 Other psoriasis: Secondary | ICD-10-CM | POA: Diagnosis not present

## 2015-09-04 DIAGNOSIS — J158 Pneumonia due to other specified bacteria: Secondary | ICD-10-CM | POA: Diagnosis not present

## 2015-09-04 DIAGNOSIS — I5023 Acute on chronic systolic (congestive) heart failure: Secondary | ICD-10-CM | POA: Diagnosis not present

## 2015-09-04 DIAGNOSIS — M25579 Pain in unspecified ankle and joints of unspecified foot: Secondary | ICD-10-CM | POA: Diagnosis not present

## 2015-09-12 ENCOUNTER — Encounter: Payer: Self-pay | Admitting: Pulmonary Disease

## 2015-09-12 ENCOUNTER — Ambulatory Visit (INDEPENDENT_AMBULATORY_CARE_PROVIDER_SITE_OTHER): Payer: Medicare Other | Admitting: Pulmonary Disease

## 2015-09-12 DIAGNOSIS — R0600 Dyspnea, unspecified: Secondary | ICD-10-CM | POA: Diagnosis not present

## 2015-09-12 DIAGNOSIS — R0609 Other forms of dyspnea: Secondary | ICD-10-CM

## 2015-09-12 DIAGNOSIS — J45909 Unspecified asthma, uncomplicated: Secondary | ICD-10-CM

## 2015-09-12 DIAGNOSIS — J479 Bronchiectasis, uncomplicated: Secondary | ICD-10-CM | POA: Diagnosis not present

## 2015-09-12 DIAGNOSIS — J449 Chronic obstructive pulmonary disease, unspecified: Secondary | ICD-10-CM | POA: Diagnosis not present

## 2015-09-12 MED ORDER — FLUTTER DEVI: 0 refills | Status: DC

## 2015-09-12 MED ORDER — FLUTICASONE FUROATE-VILANTEROL 100-25 MCG/INH IN AEPB: 1.0000 | INHALATION_SPRAY | Freq: Every day | RESPIRATORY_TRACT | 0 refills | Status: AC

## 2015-09-12 MED ORDER — ALBUTEROL SULFATE (2.5 MG/3ML) 0.083% IN NEBU: 2.5000 mg | INHALATION_SOLUTION | Freq: Four times a day (QID) | RESPIRATORY_TRACT | 11 refills | Status: DC | PRN

## 2015-09-12 NOTE — Assessment & Plan Note (Signed)
He has been experiencing increasing shortness of breath recently which I believe is related to asthma based on his wheezing on exam today. He is no longer taking a controller medicine. His bronchiectasis is also contributing.  Plan: Breo sample provided today Use albuterol twice a day for bronchiectasis, see below Repeat PFT

## 2015-09-12 NOTE — Patient Instructions (Signed)
Use the flutter valve 4-5 breaths, 4-5 times a day Provide [place] with a sample of your mucus We will schedule a lung function test and chest CT Use Breo daily Use albuterol twice a day I will see you back in 6-8 weeks or sooner if needed

## 2015-09-12 NOTE — Assessment & Plan Note (Signed)
Worsening, see above 

## 2015-09-12 NOTE — Assessment & Plan Note (Signed)
I believe that his worsening dyspnea recently has been directly related to his bronchiectasis and asthma, but it's also possible that he may have an underlying interstitial lung disease considering his known diagnosis of rheumatoid arthritis.  We need to work on enhancing his mucociliary clearance measures.  Plan: Pulmonary function test High-resolution CT scan of the chest Add albuterol twice a day Add flutter valve Sputum culture for AFB, fungal, bacterial Sample of Breo provided today If no improvement in the next 6-8 weeks with these interventions or if there is any sort of evidence of worsening bronchiectasis on his CT chest then we need to consider adding a therapy vest. Follow-up 6-8 weeks

## 2015-09-12 NOTE — Progress Notes (Signed)
Subjective:    Patient ID: [name], male    DOB: Mar 23, 1930, 82 y.o.   MRN: [id number]  Synopsis: Former patient of Dr. Joya Gaskins who has bronchiectasis and asthma. Also has rheumatoid arthritis. PFTs 01/03/14: FeV1 94% FVC 79% Fev1/FVC 84% TLC 86% DLCO 64% He notes that he had severe pneumonia when he fought in the [place] war and ended up coming home form the war and he had to be admitted to [facility] where he was cared for several months.   HPI Chief Complaint  Patient presents with  . Follow-up    former PW pt being treated for bronchiectasis, COPD.  Pt c/o nonprod cough, sob with exertion.     [name] is a former patient of Dr. Joya Gaskins who has asthma and bronchiectasis. He notes some dyspnea despite saying active.  He continues to work in a non-strenuous job one day a week. He notes that he can walk about 1000 feet on level before dyspnea: no pain, feels like he can't get enough air in.  He notes very rare chest pain in the left side.  He notes that he had severe pneumonia when he fought in the [place] war and ended up coming home form the war and he had to be admitted to [facility] where he was cared for several months.    His daughter says that he had a "touch of pneumonia" around January 2017, and most recently he took another round of antibiotics for a chest infection.  He still has the dyspnea we mentioned.  This has progressed.   He is no longer taking any inhaled medicines because he says he thinks they didn't help.  He produces mucus on a daily basis in the morning.  His family notes wheezing.   Past Medical History:  Diagnosis Date  . Anemia   . Atrial fibrillation ([place]) 12/08/2013  . Cataract   . CHF (congestive heart failure) ([place])   . Chronic kidney disease   . Constipation    takes stool softener daily  . GERD (gastroesophageal reflux disease)    takes Zantac daily   . History of blood transfusion    "related to one of  my ORs"  . Hyperlipidemia    takes Pravastatin daily  . Hypertension    takes Cardizem,Proscar,and Lisinopril daily  . Impaired hearing    wears hearing aids  . Joint pain   . Joint swelling   . Peripheral neuropathy ([place])   . Rheumatoid arthritis ([place])    "all over; take orencia  q 4 wks" (02/01/2013)  . Type II diabetes mellitus (H[facility])    takes Metformin daily      Review of Systems  Constitutional: Negative for diaphoresis, fatigue and fever.  HENT: Negative for nosebleeds, postnasal drip and rhinorrhea.   Respiratory: Positive for shortness of breath. Negative for chest tightness and stridor.   Cardiovascular: Negative for chest pain, palpitations and leg swelling.       Objective:   Physical Exam Vitals:   09/12/15 1405  BP: 122/60  Pulse: 67  SpO2: 97%  Weight: 214 lb (97.1 kg)  Height: 5\' 8"  (1.727 m)  RA  Gen: well appearing, no acute distress HENT: NCAT, OP clear, neck supple without masses Eyes: PERRL, EOMi Lymph: no cervical lymphadenopathy PULM: wheezing bilaterally, crackles CV: Irreg irreg, no mgr, no JVD GI: BS+, soft, nontender, no hsm Derm: no rash or skin breakdown MSK: normal bulk and tone Neuro: A&Ox4,  CN II-XII intact, strength 5/5 in all 4 extremities Psyche: normal mood and affect   Records from Dr. Joya Gaskins reviewed were he was previously treated for bronchiectasis  Chest x-ray from 2016 images personally reviewed showing mild hyperinflation and questionable scarring in the lower lobes bilaterally      Assessment & Plan:  Chronic obstructive airway disease with asthma ([place]) He has been experiencing increasing shortness of breath recently which I believe is related to asthma based on his wheezing on exam today. He is no longer taking a controller medicine. His bronchiectasis is also contributing.  Plan: Breo sample provided today Use albuterol twice a day for bronchiectasis, see below Repeat PFT  Bronchiectasis without acute  exacerbation ([place]) I believe that his worsening dyspnea recently has been directly related to his bronchiectasis and asthma, but it's also possible that he may have an underlying interstitial lung disease considering his known diagnosis of rheumatoid arthritis.  We need to work on enhancing his mucociliary clearance measures.  Plan: Pulmonary function test High-resolution CT scan of the chest Add albuterol twice a day Add flutter valve Sputum culture for AFB, fungal, bacterial Sample of Breo provided today If no improvement in the next 6-8 weeks with these interventions or if there is any sort of evidence of worsening bronchiectasis on his CT chest then we need to consider adding a therapy vest. Follow-up 6-8 weeks  Dyspnea Worsening, see above    Current Outpatient Prescriptions:  .  Abatacept (ORENCIA IV), Inject into the vein every 30 (thirty) days., Disp: , Rfl:  .  atorvastatin (LIPITOR) 20 MG tablet, Take 1 tablet (20 mg total) by mouth daily at 6 PM. (Patient taking differently: Take 40 mg by mouth daily at 6 PM. ), Disp: 30 tablet, Rfl: 0 .  cetirizine (ZYRTEC) 10 MG tablet, Take 10 mg by mouth daily., Disp: , Rfl:  .  ELIQUIS 2.5 MG TABS tablet, Take 2.5 mg by mouth 2 (two) times daily., Disp: , Rfl: 12 .  ferrous sulfate 325 (65 FE) MG tablet, Take 325 mg by mouth daily with breakfast. , Disp: , Rfl:  .  finasteride (PROSCAR) 5 MG tablet, Take 5 mg by mouth at bedtime. , Disp: , Rfl:  .  folic acid (FOLVITE) 1 MG tablet, Take 1 mg by mouth daily.  , Disp: , Rfl:  .  furosemide (LASIX) 80 MG tablet, Take 80 mg by mouth 2 (two) times daily., Disp: , Rfl:  .  gabapentin (NEURONTIN) 300 MG capsule, Take 1 capsule (300 mg total) by mouth 2 (two) times daily., Disp: 15 capsule, Rfl: 0 .  HYDROcodone-acetaminophen (NORCO) 5-325 MG per tablet, Take 1 tablet by mouth every 6 (six) hours as needed for moderate pain., Disp: 30 tablet, Rfl: 0 .  Insulin Glargine (TOUJEO SOLOSTAR) 300  UNIT/ML SOPN, Inject 10 Units into the skin at bedtime. , Disp: , Rfl:  .  isosorbide-hydrALAZINE (BIDIL) 20-37.5 MG tablet, Take 1 tablet by mouth 3 (three) times daily., Disp: , Rfl:  .  metoprolol (LOPRESSOR) 50 MG tablet, Take 25 mg by mouth 3 (three) times daily., Disp: , Rfl:  .  ranitidine (ZANTAC) 150 MG tablet, Take 150 mg by mouth 2 (two) times daily.  , Disp: , Rfl:  .  Tamsulosin HCl (FLOMAX) 0.4 MG CAPS, Take 0.4 mg by mouth daily after supper. , Disp: , Rfl:  .  levothyroxine (SYNTHROID, LEVOTHROID) 25 MCG tablet, Take 25 mcg by mouth daily before breakfast., Disp: , Rfl:

## 2015-09-24 DIAGNOSIS — M0589 Other rheumatoid arthritis with rheumatoid factor of multiple sites: Secondary | ICD-10-CM | POA: Diagnosis not present

## 2015-09-25 ENCOUNTER — Ambulatory Visit (INDEPENDENT_AMBULATORY_CARE_PROVIDER_SITE_OTHER)
Admission: RE | Admit: 2015-09-25 | Discharge: 2015-09-25 | Disposition: A | Discharge status: Home / Self Care | Payer: Medicare Other | Source: Ambulatory Visit | Attending: Pulmonary Disease | Admitting: Pulmonary Disease

## 2015-09-25 DIAGNOSIS — J479 Bronchiectasis, uncomplicated: Secondary | ICD-10-CM

## 2015-09-25 DIAGNOSIS — R0600 Dyspnea, unspecified: Secondary | ICD-10-CM | POA: Diagnosis not present

## 2015-10-09 DIAGNOSIS — M2550 Pain in unspecified joint: Secondary | ICD-10-CM | POA: Diagnosis not present

## 2015-10-09 DIAGNOSIS — M545 Low back pain: Secondary | ICD-10-CM | POA: Diagnosis not present

## 2015-10-09 DIAGNOSIS — N183 Chronic kidney disease, stage 3 (moderate): Secondary | ICD-10-CM | POA: Diagnosis not present

## 2015-10-09 DIAGNOSIS — M170 Bilateral primary osteoarthritis of knee: Secondary | ICD-10-CM | POA: Diagnosis not present

## 2015-10-09 DIAGNOSIS — Z79899 Other long term (current) drug therapy: Secondary | ICD-10-CM | POA: Diagnosis not present

## 2015-10-09 DIAGNOSIS — M0579 Rheumatoid arthritis with rheumatoid factor of multiple sites without organ or systems involvement: Secondary | ICD-10-CM | POA: Diagnosis not present

## 2015-10-10 DIAGNOSIS — D638 Anemia in other chronic diseases classified elsewhere: Secondary | ICD-10-CM | POA: Diagnosis not present

## 2015-10-10 DIAGNOSIS — E1129 Type 2 diabetes mellitus with other diabetic kidney complication: Secondary | ICD-10-CM | POA: Diagnosis not present

## 2015-10-10 DIAGNOSIS — M7989 Other specified soft tissue disorders: Secondary | ICD-10-CM | POA: Diagnosis not present

## 2015-10-10 DIAGNOSIS — R339 Retention of urine, unspecified: Secondary | ICD-10-CM | POA: Diagnosis not present

## 2015-10-10 DIAGNOSIS — N184 Chronic kidney disease, stage 4 (severe): Secondary | ICD-10-CM | POA: Diagnosis not present

## 2015-10-10 DIAGNOSIS — N3946 Mixed incontinence: Secondary | ICD-10-CM | POA: Diagnosis not present

## 2015-10-10 DIAGNOSIS — N179 Acute kidney failure, unspecified: Secondary | ICD-10-CM | POA: Diagnosis not present

## 2015-10-10 DIAGNOSIS — M25572 Pain in left ankle and joints of left foot: Secondary | ICD-10-CM | POA: Diagnosis not present

## 2015-10-10 DIAGNOSIS — N2581 Secondary hyperparathyroidism of renal origin: Secondary | ICD-10-CM | POA: Diagnosis not present

## 2015-10-10 DIAGNOSIS — I129 Hypertensive chronic kidney disease with stage 1 through stage 4 chronic kidney disease, or unspecified chronic kidney disease: Secondary | ICD-10-CM | POA: Diagnosis not present

## 2015-10-10 DIAGNOSIS — I509 Heart failure, unspecified: Secondary | ICD-10-CM | POA: Diagnosis not present

## 2015-10-10 DIAGNOSIS — I4891 Unspecified atrial fibrillation: Secondary | ICD-10-CM | POA: Diagnosis not present

## 2015-10-12 ENCOUNTER — Emergency Department (HOSPITAL_COMMUNITY)
Admission: EM | Admit: 2015-10-12 | Discharge: 2015-10-12 | Disposition: A | Discharge status: Home / Self Care | Payer: Medicare Other | Attending: Emergency Medicine | Admitting: Emergency Medicine

## 2015-10-12 ENCOUNTER — Emergency Department (HOSPITAL_COMMUNITY): Payer: Medicare Other

## 2015-10-12 ENCOUNTER — Encounter (HOSPITAL_COMMUNITY): Payer: Self-pay | Admitting: Nurse Practitioner

## 2015-10-12 DIAGNOSIS — Z96612 Presence of left artificial shoulder joint: Secondary | ICD-10-CM | POA: Insufficient documentation

## 2015-10-12 DIAGNOSIS — J449 Chronic obstructive pulmonary disease, unspecified: Secondary | ICD-10-CM | POA: Insufficient documentation

## 2015-10-12 DIAGNOSIS — I130 Hypertensive heart and chronic kidney disease with heart failure and stage 1 through stage 4 chronic kidney disease, or unspecified chronic kidney disease: Secondary | ICD-10-CM | POA: Insufficient documentation

## 2015-10-12 DIAGNOSIS — Z7984 Long term (current) use of oral hypoglycemic drugs: Secondary | ICD-10-CM | POA: Diagnosis not present

## 2015-10-12 DIAGNOSIS — I5023 Acute on chronic systolic (congestive) heart failure: Secondary | ICD-10-CM | POA: Diagnosis not present

## 2015-10-12 DIAGNOSIS — Z96651 Presence of right artificial knee joint: Secondary | ICD-10-CM | POA: Insufficient documentation

## 2015-10-12 DIAGNOSIS — N184 Chronic kidney disease, stage 4 (severe): Secondary | ICD-10-CM | POA: Insufficient documentation

## 2015-10-12 DIAGNOSIS — E039 Hypothyroidism, unspecified: Secondary | ICD-10-CM | POA: Diagnosis not present

## 2015-10-12 DIAGNOSIS — E1122 Type 2 diabetes mellitus with diabetic chronic kidney disease: Secondary | ICD-10-CM | POA: Insufficient documentation

## 2015-10-12 DIAGNOSIS — Z87891 Personal history of nicotine dependence: Secondary | ICD-10-CM | POA: Diagnosis not present

## 2015-10-12 DIAGNOSIS — Z79899 Other long term (current) drug therapy: Secondary | ICD-10-CM | POA: Insufficient documentation

## 2015-10-12 DIAGNOSIS — Z794 Long term (current) use of insulin: Secondary | ICD-10-CM | POA: Diagnosis not present

## 2015-10-12 DIAGNOSIS — M79672 Pain in left foot: Secondary | ICD-10-CM | POA: Diagnosis not present

## 2015-10-12 DIAGNOSIS — E1140 Type 2 diabetes mellitus with diabetic neuropathy, unspecified: Secondary | ICD-10-CM | POA: Insufficient documentation

## 2015-10-12 DIAGNOSIS — M7989 Other specified soft tissue disorders: Secondary | ICD-10-CM | POA: Diagnosis not present

## 2015-10-12 DIAGNOSIS — M25572 Pain in left ankle and joints of left foot: Secondary | ICD-10-CM | POA: Diagnosis not present

## 2015-10-12 LAB — BASIC METABOLIC PANEL
ANION GAP: 12 (ref 5–15)
BUN: 36 mg/dL — ABNORMAL HIGH (ref 6–20)
CALCIUM: 8.8 mg/dL — AB (ref 8.9–10.3)
CO2: 27 mmol/L (ref 22–32)
Chloride: 98 mmol/L — ABNORMAL LOW (ref 101–111)
Creatinine, Ser: 2.22 mg/dL — ABNORMAL HIGH (ref 0.61–1.24)
GFR calc non Af Amer: 26 mL/min — ABNORMAL LOW (ref >60)
GFR, EST AFRICAN AMERICAN: 30 mL/min — AB (ref >60)
Glucose, Bld: 128 mg/dL — ABNORMAL HIGH (ref 65–99)
Potassium: 3.1 mmol/L — ABNORMAL LOW (ref 3.5–5.1)
SODIUM: 137 mmol/L (ref 135–145)

## 2015-10-12 LAB — CBC WITH DIFFERENTIAL/PLATELET
BASOS ABS: 0.1 10*3/uL (ref 0.0–0.1)
BASOS PCT: 1 %
EOS ABS: 0.2 10*3/uL (ref 0.0–0.7)
Eosinophils Relative: 2 %
HCT: 36.5 % — ABNORMAL LOW (ref 39.0–52.0)
Hemoglobin: 12.1 g/dL — ABNORMAL LOW (ref 13.0–17.0)
Lymphocytes Relative: 29 %
Lymphs Abs: 2.9 10*3/uL (ref 0.7–4.0)
MCH: 29.1 pg (ref 26.0–34.0)
MCHC: 33.2 g/dL (ref 30.0–36.0)
MCV: 87.7 fL (ref 78.0–100.0)
MONO ABS: 0.7 10*3/uL (ref 0.1–1.0)
Monocytes Relative: 7 %
Neutro Abs: 5.9 10*3/uL (ref 1.7–7.7)
Neutrophils Relative %: 61 %
Platelets: 292 10*3/uL (ref 150–400)
RBC: 4.16 MIL/uL — ABNORMAL LOW (ref 4.22–5.81)
RDW: 13.9 % (ref 11.5–15.5)
WBC: 9.8 10*3/uL (ref 4.0–10.5)

## 2015-10-12 LAB — C-REACTIVE PROTEIN: CRP: 0.9 mg/dL (ref <1.0)

## 2015-10-12 LAB — SEDIMENTATION RATE: Sed Rate: 60 mm/hr — ABNORMAL HIGH (ref 0–16)

## 2015-10-12 MED ORDER — CLINDAMYCIN HCL 150 MG PO CAPS: 300.0000 mg | ORAL_CAPSULE | Freq: Four times a day (QID) | ORAL | 0 refills | Status: AC

## 2015-10-12 NOTE — ED Notes (Signed)
Pt and family understood dc material and follow up. Scripts given at [employer]

## 2015-10-12 NOTE — ED Triage Notes (Signed)
Pt presents with c/o L foot pain. He c/o 1 month history of increasing pain to L great toe. He had surgery with pins to this toe about 3 years ago without complications. He went to his PCP who started him on oral abx and steroids for possible cellulitis of the toe last month. When symptoms did not improve, his pcp ordered an xray and referred him to ortho. He has not seen ortho MD yet but the xray resulted today indicating possible osteomyelitis so his PCP referred to ED for further management. The pt is alert and breathing easily.

## 2015-10-12 NOTE — ED Provider Notes (Signed)
I saw and evaluated the patient, reviewed the resident's note and I agree with the findings and plan.  82 year old male who had multiple piece of hardware put into his left foot proximal to 4 years ago however last Monday is a progressively worsening swelling and pain to his left big toe where he has a screw in place. We did see his primary doctor didn't x-ray him Wednesday that showed ostial lysis around the screws in here for further evaluation. Here his toe does appear quite a bit larger than other one is certainly tender to palpation. No erythema, warmth or fluctuance. Concern for possible osteomyelitis will evaluate appropriately and likely discussed with orthopedics about further management.   Merrily Pew, MD 10/15/15 1049

## 2015-10-12 NOTE — ED Provider Notes (Signed)
[facility] DEPT Provider Note   CSN: [id number] Arrival date & time: 10/12/15  1423     History   Chief Complaint Chief Complaint  Patient presents with  . Foot Pain    HPI [name] is a 82 y.o. male.  Patient is s/p left foot orthopedic surgery. Seen by PCP 2 days ago for foot pain. XR ordered, concerning for osteomyelitis.  Sent for further evaluation.    Foot Pain  This is a new problem. Episode onset: 3 weeks. The problem occurs constantly. The problem has been gradually worsening. Pertinent negatives include no chest pain, no abdominal pain, no headaches and no shortness of breath. The symptoms are aggravated by walking.    Past Medical History:  Diagnosis Date  . Anemia   . Atrial fibrillation ([place]) 12/08/2013  . Cataract   . CHF (congestive heart failure) ([place])   . Chronic kidney disease   . Constipation    takes stool softener daily  . GERD (gastroesophageal reflux disease)    takes Zantac daily   . History of blood transfusion    "related to one of my ORs"  . Hyperlipidemia    takes Pravastatin daily  . Hypertension    takes Cardizem,Proscar,and Lisinopril daily  . Impaired hearing    wears hearing aids  . Joint pain   . Joint swelling   . Peripheral neuropathy ([place])   . Rheumatoid arthritis ([place])    "all over; take orencia  q 4 wks" (02/01/2013)  . Type II diabetes mellitus (HCC)    takes Metformin daily    Patient Active Problem List   Diagnosis Date Noted  . Dyspnea 09/12/2015  . Sepsis due to undetermined organism ([place])   . Chronic systolic CHF (congestive heart failure) ([place])   . Chronic kidney disease (CKD), stage IV (severe) ([place])   . Cellulitis of left lower extremity   . Pulmonary hypertension ([place])   . Paroxysmal atrial fibrillation (HCC)   . Other emphysema ([place])   . Bronchiectasis without complication ([place])   . Other specified hypothyroidism   . Fever 08/18/2014  . Sepsis ([place]) 08/18/2014  . CKD (chronic kidney disease)  stage 4, GFR 15-29 ml/min (HCC) 08/18/2014  . Acute on chronic systolic heart failure ([place]) 08/18/2014  . Bronchiectasis ([place]) 08/18/2014  . Hypothyroidism 08/18/2014  . Bronchiectasis without acute exacerbation ([place]) 12/29/2013  . LVH (left ventricular hypertrophy) due to hypertensive disease 12/10/2013  . Atrial fibrillation ([place]) 12/08/2013  . CHF (congestive heart failure) ([place]) 12/07/2013  . Chronic obstructive airway disease with asthma ([place]) 12/07/2013  . Thyroid goiter 02/01/2013  . Low TSH level 05/18/2012  . Right thyroid nodule 05/18/2012  . Diabetes ([place]) 05/18/2012  . HLD (hyperlipidemia) 05/18/2012  . Rheumatoid arthritis ([place]) 05/18/2012  . Essential hypertension 05/18/2012  . GERD (gastroesophageal reflux disease) 05/18/2012  . Anemia 05/18/2012    Past Surgical History:  Procedure Laterality Date  . ANKLE FUSION Left 2014  . BIOPSY THYROID  2014  . CARPAL TUNNEL RELEASE Bilateral 1990's  . CATARACT EXTRACTION W/ INTRAOCULAR LENS  IMPLANT, BILATERAL Bilateral 1990's  . COLONOSCOPY    . FOREIGN BODY REMOVAL Right 09/06/2013   Procedure: REMOVAL FOREIGN BODY RIGHT INDEX FINGER;  Surgeon: Daryll Brod, MD;  Location: [facility];  Service: Orthopedics;  Laterality: Right;  ANESTHESIA: IV REGIONAL FAB  . REPAIR TENDONS FOOT Left 1946; 1984   "farming accident; repaired tendons both times"  . SHOULDER ARTHROSCOPY W/ ROTATOR CUFF REPAIR Right 1990's  .  THYROIDECTOMY Right 02/01/2013   Procedure: RIGHT THYROIDECTOMY LOBECTOMY;  Surgeon: Earnstine Regal, MD;  Location: [facility];  Service: General;  Laterality: Right;  . THYROIDECTOMY, PARTIAL Right 02/01/2013  . TONSILLECTOMY  1930's  . TOTAL KNEE ARTHROPLASTY Right 12/16/2010   Procedure: TOTAL KNEE ARTHROPLASTY; rt Surgeon: Rudean Haskell, MD;  Location: [facility];  Service: Orthopedics;  Laterality: Right;  . TOTAL SHOULDER REPLACEMENT Left 03/2010       Home Medications    Prior to Admission medications     Medication Sig Start Date End Date Taking? Authorizing Provider  Abatacept (ORENCIA IV) Inject into the vein every 30 (thirty) days.    Historical Provider, MD  albuterol (PROVENTIL) (2.5 MG/3ML) 0.083% nebulizer solution Take 3 mLs (2.5 mg total) by nebulization every 6 (six) hours as needed for wheezing or shortness of breath. DX: J44.9. 09/12/15   Juanito Doom, MD  atorvastatin (LIPITOR) 20 MG tablet Take 1 tablet (20 mg total) by mouth daily at 6 PM. Patient taking differently: Take 40 mg by mouth daily at 6 PM.  08/22/14   Allie Bossier, MD  cetirizine (ZYRTEC) 10 MG tablet Take 10 mg by mouth daily.    Historical Provider, MD  ELIQUIS 2.5 MG TABS tablet Take 2.5 mg by mouth 2 (two) times daily. 01/22/14   Historical Provider, MD  ferrous sulfate 325 (65 FE) MG tablet Take 325 mg by mouth daily with breakfast.     Historical Provider, MD  finasteride (PROSCAR) 5 MG tablet Take 5 mg by mouth at bedtime.     Historical Provider, MD  folic acid (FOLVITE) 1 MG tablet Take 1 mg by mouth daily.      Historical Provider, MD  furosemide (LASIX) 80 MG tablet Take 80 mg by mouth 2 (two) times daily.    Historical Provider, MD  gabapentin (NEURONTIN) 300 MG capsule Take 1 capsule (300 mg total) by mouth 2 (two) times daily. 12/13/13   Milagros Loll, MD  HYDROcodone-acetaminophen (NORCO) 5-325 MG per tablet Take 1 tablet by mouth every 6 (six) hours as needed for moderate pain. 09/06/13   Daryll Brod, MD  Insulin Glargine (TOUJEO SOLOSTAR) 300 UNIT/ML SOPN Inject 10 Units into the skin at bedtime.     Historical Provider, MD  isosorbide-hydrALAZINE (BIDIL) 20-37.5 MG tablet Take 1 tablet by mouth 3 (three) times daily.    Historical Provider, MD  levothyroxine (SYNTHROID, LEVOTHROID) 25 MCG tablet Take 25 mcg by mouth daily before breakfast.    Historical Provider, MD  metoprolol (LOPRESSOR) 50 MG tablet Take 25 mg by mouth 3 (three) times daily.    Historical Provider, MD  ranitidine (ZANTAC) 150 MG  tablet Take 150 mg by mouth 2 (two) times daily.      Historical Provider, MD  Respiratory Therapy Supplies (FLUTTER) DEVI Use as directed 09/12/15   Juanito Doom, MD  Tamsulosin HCl (FLOMAX) 0.4 MG CAPS Take 0.4 mg by mouth daily after supper.     Historical Provider, MD    Family History Family History  Problem Relation Age of Onset  . Cancer Mother     breast  . Aneurysm Father     Social History Social History  Substance Use Topics  . Smoking status: Former Smoker    Packs/day: 1.00    Years: 15.00    Types: Cigarettes    Quit date: 09/01/1965  . Smokeless tobacco: Never Used     Comment: quit smoking in 1967  . Alcohol  use No     Comment: 02/01/2013 "stopped drinking in 1967; about an alcoholic when I quit"     Allergies   Review of patient's allergies indicates no known allergies.   Review of Systems Review of Systems  Constitutional: Negative for chills and fever.  HENT: Negative for ear pain and sore throat.   Eyes: Negative for pain and visual disturbance.  Respiratory: Negative for cough and shortness of breath.   Cardiovascular: Negative for chest pain and palpitations.  Gastrointestinal: Negative for abdominal pain and vomiting.  Genitourinary: Negative for dysuria and hematuria.  Musculoskeletal: Negative for arthralgias and back pain.  Skin: Negative for color change and rash.  Neurological: Negative for seizures, syncope and headaches.  All other systems reviewed and are negative.    Physical Exam Updated Vital Signs BP 123/86 (BP Location: Right Arm)   Pulse 72   Temp 98.7 F (37.1 C) (Oral)   Resp 18   SpO2 97%   Physical Exam  Constitutional: He is oriented to person, place, and time. He appears well-developed and well-nourished.  HENT:  Head: Normocephalic and atraumatic.  Eyes: Conjunctivae are normal.  Neck: Neck supple.  Cardiovascular: Normal rate and regular rhythm.   No murmur heard. Pulmonary/Chest: Effort normal and breath  sounds normal. No respiratory distress.  Abdominal: Soft. There is no tenderness.  Musculoskeletal: He exhibits tenderness (left medial foot. Overlying mild erythema, no streaking. Good cap refill.). He exhibits no edema.  Neurological: He is alert and oriented to person, place, and time.  Skin: Skin is warm and dry.  Psychiatric: He has a normal mood and affect.  Nursing note and vitals reviewed.    ED Treatments / Results  Labs (all labs ordered are listed, but only abnormal results are displayed) Labs Reviewed  CBC WITH DIFFERENTIAL/PLATELET - Abnormal; Notable for the following:       Result Value   RBC 4.16 (*)    Hemoglobin 12.1 (*)    HCT 36.5 (*)    All other components within normal limits  BASIC METABOLIC PANEL - Abnormal; Notable for the following:    Potassium 3.1 (*)    Chloride 98 (*)    Glucose, Bld 128 (*)    BUN 36 (*)    Creatinine, Ser 2.22 (*)    Calcium 8.8 (*)    GFR calc non Af Amer 26 (*)    GFR calc Af Amer 30 (*)    All other components within normal limits  SEDIMENTATION RATE - Abnormal; Notable for the following:    Sed Rate 60 (*)    All other components within normal limits  C-REACTIVE PROTEIN    EKG  EKG Interpretation None       Radiology Dg Ankle Complete Left  Result Date: 10/12/2015 CLINICAL DATA:  Foot/ankle pain and swelling EXAM: LEFT ANKLE COMPLETE - 3+ VIEW COMPARISON:  None. FINDINGS: Status post distal fibular resection. Status post ORIF with tibiotalar fusion. Degenerative changes of the dorsal midfoot. No fracture or dislocation is seen. The visualized soft tissues are unremarkable. IMPRESSION: Postsurgical changes, as described above. No acute osseus abnormality is seen. Electronically Signed   By: Julian Hy M.D.   On: 10/12/2015 18:08   Dg Foot Complete Left  Result Date: 10/12/2015 CLINICAL DATA:  Foot/ankle pain and swelling EXAM: LEFT FOOT - COMPLETE 3+ VIEW COMPARISON:  11/30/2011 FINDINGS: Status post ORIF  with fusion at the 1st interphalangeal joint. Associated osteolysis involving the 1st distal phalanx. Mild associated soft tissue  swelling. Degenerative changes of the 1st and second MTP joints. Degenerative changes the dorsal midfoot. Status post ORIF of the tibiotalar joint with tibiotalar fusion. IMPRESSION: Postsurgical changes of the tibiotalar joint and 1st IP joint. Associated osteolysis of the 1st distal phalanx with overlying soft tissue swelling. Osteomyelitis is difficult to exclude. Electronically Signed   By: Julian Hy M.D.   On: 10/12/2015 18:08    Procedures Procedures (including critical care time)  Medications Ordered in ED Medications - No data to display   Initial Impression / Assessment and Plan / ED Course  I have reviewed the triage vital signs and the nursing notes.  Pertinent labs & imaging results that were available during my care of the patient were reviewed by me and considered in my medical decision making (see chart for details).  Clinical Course   Patient had orthopedic surgery to left foot 4 years ago. No pain or problems until 3 weeks ago.  Patient presents for left foot pain, swelling, and erythema.  He was previously evaluated by PCP and XR obtained was concerning for osteomyelitis.  XR foot and XR ankle obtained, personally reviewed by me, demonstrates post surgical changes with osteolysis; cant exclude osteomyelitis.  Labs ordered, including CBC, BMP, ESR, and CRP.  Inflammatory markers elevated, also possibly consistent with osteomyelitis.  No systemic signs of infection; no fevers.  Patient is stable and not septic.  Doubt other cellulitis, gout.  [facility] as they had done the initial orthopedic repairs to the foot.  They feel the patient is safe for discharge to home with antibiotics and follow up in their clinic on Monday.  Patient is discharged to home with strict return precautions, follow up instructions,  educational materials, and prescription for clindamycin.  Final Clinical Impressions(s) / ED Diagnoses   Final diagnoses:  Foot pain, left    New Prescriptions Discharge Medication List as of 10/12/2015  8:51 PM    START taking these medications   Details  clindamycin (CLEOCIN) 150 MG capsule Take 2 capsules (300 mg total) by mouth every 6 (six) hours., Starting Fri 10/12/2015, Until Fri 10/19/2015, Print         Elveria Rising, MD 10/13/15 0017    Merrily Pew, MD 10/13/15 4944

## 2015-10-17 DIAGNOSIS — M8668 Other chronic osteomyelitis, other site: Secondary | ICD-10-CM | POA: Diagnosis not present

## 2015-10-17 DIAGNOSIS — M79672 Pain in left foot: Secondary | ICD-10-CM | POA: Diagnosis not present

## 2015-10-24 ENCOUNTER — Telehealth: Payer: Self-pay | Admitting: Pulmonary Disease

## 2015-10-24 DIAGNOSIS — Q2546 Tortuous aortic arch: Secondary | ICD-10-CM | POA: Diagnosis not present

## 2015-10-24 DIAGNOSIS — I10 Essential (primary) hypertension: Secondary | ICD-10-CM | POA: Diagnosis not present

## 2015-10-24 DIAGNOSIS — E119 Type 2 diabetes mellitus without complications: Secondary | ICD-10-CM | POA: Diagnosis not present

## 2015-10-24 DIAGNOSIS — I482 Chronic atrial fibrillation: Secondary | ICD-10-CM | POA: Diagnosis not present

## 2015-10-24 DIAGNOSIS — L03116 Cellulitis of left lower limb: Secondary | ICD-10-CM | POA: Diagnosis not present

## 2015-10-24 DIAGNOSIS — M05742 Rheumatoid arthritis with rheumatoid factor of left hand without organ or systems involvement: Secondary | ICD-10-CM | POA: Diagnosis not present

## 2015-10-24 DIAGNOSIS — Z01810 Encounter for preprocedural cardiovascular examination: Secondary | ICD-10-CM | POA: Diagnosis not present

## 2015-10-24 DIAGNOSIS — M05741 Rheumatoid arthritis with rheumatoid factor of right hand without organ or systems involvement: Secondary | ICD-10-CM | POA: Diagnosis not present

## 2015-10-24 DIAGNOSIS — I5023 Acute on chronic systolic (congestive) heart failure: Secondary | ICD-10-CM | POA: Diagnosis not present

## 2015-10-24 NOTE — Telephone Encounter (Signed)
Spoke with Dr Cox's nurse Narda Rutherford Dr Tobie Poet is requesting to speak with BQ about pt [name] that BQ is not in the office this afternoon - Dr Tobie Poet requests BQ call her "first thing in the morning" @ [phone]  Routing high priority to BQ

## 2015-10-25 NOTE — Telephone Encounter (Signed)
I spoke to her, questions answered.

## 2015-10-29 ENCOUNTER — Other Ambulatory Visit: Payer: Self-pay | Admitting: Orthopedic Surgery

## 2015-11-05 ENCOUNTER — Encounter (HOSPITAL_BASED_OUTPATIENT_CLINIC_OR_DEPARTMENT_OTHER): Payer: Self-pay | Admitting: *Deleted

## 2015-11-06 ENCOUNTER — Encounter: Payer: Self-pay | Admitting: Pulmonary Disease

## 2015-11-06 ENCOUNTER — Ambulatory Visit (INDEPENDENT_AMBULATORY_CARE_PROVIDER_SITE_OTHER): Payer: Medicare Other | Admitting: Pulmonary Disease

## 2015-11-06 ENCOUNTER — Ambulatory Visit (HOSPITAL_COMMUNITY)
Admission: RE | Admit: 2015-11-06 | Discharge: 2015-11-06 | Disposition: A | Discharge status: Home / Self Care | Payer: Medicare Other | Source: Ambulatory Visit | Attending: Pulmonary Disease | Admitting: Pulmonary Disease

## 2015-11-06 VITALS — BP 130/60 | HR 95 | Ht 68.0 in | Wt 212.2 lb

## 2015-11-06 DIAGNOSIS — J479 Bronchiectasis, uncomplicated: Secondary | ICD-10-CM | POA: Diagnosis not present

## 2015-11-06 DIAGNOSIS — R0600 Dyspnea, unspecified: Secondary | ICD-10-CM | POA: Insufficient documentation

## 2015-11-06 DIAGNOSIS — R0602 Shortness of breath: Secondary | ICD-10-CM

## 2015-11-06 LAB — PULMONARY FUNCTION TEST
DL/VA % PRED: 105 %
DL/VA: 4.69 ml/min/mmHg/L
DLCO UNC: 23.4 ml/min/mmHg
DLCO unc % pred: 78 %
FEF 25-75 POST: 3.27 L/s
FEF 25-75 Pre: 2.7 L/sec
FEF2575-%Change-Post: 21 %
FEF2575-%PRED-POST: 199 %
FEF2575-%PRED-PRE: 164 %
FEV1-%CHANGE-POST: 4 %
FEV1-%Pred-Post: 104 %
FEV1-%Pred-Pre: 100 %
FEV1-Post: 2.6 L
FEV1-Pre: 2.49 L
FEV1FVC-%Change-Post: 2 %
FEV1FVC-%PRED-PRE: 119 %
FEV6-%Change-Post: 2 %
FEV6-%Pred-Post: 90 %
FEV6-%Pred-Pre: 88 %
FEV6-POST: 2.99 L
FEV6-Pre: 2.92 L
FEV6FVC-%CHANGE-POST: 0 %
FEV6FVC-%PRED-POST: 108 %
FEV6FVC-%Pred-Pre: 108 %
FVC-%Change-Post: 1 %
FVC-%PRED-POST: 83 %
FVC-%PRED-PRE: 82 %
FVC-POST: 2.99 L
FVC-PRE: 2.93 L
POST FEV1/FVC RATIO: 87 %
PRE FEV1/FVC RATIO: 85 %
PRE FEV6/FVC RATIO: 100 %
Post FEV6/FVC ratio: 100 %
RV % pred: 110 %
RV: 2.88 L
TLC % PRED: 91 %
TLC: 6.08 L

## 2015-11-06 MED ORDER — ALBUTEROL SULFATE (2.5 MG/3ML) 0.083% IN NEBU: 2.5000 mg | INHALATION_SOLUTION | Freq: Four times a day (QID) | RESPIRATORY_TRACT | 11 refills | Status: DC | PRN

## 2015-11-06 MED ORDER — FLUTICASONE FUROATE-VILANTEROL 100-25 MCG/INH IN AEPB: 1.0000 | INHALATION_SPRAY | Freq: Every day | RESPIRATORY_TRACT | 5 refills | Status: DC

## 2015-11-06 MED ORDER — ALBUTEROL SULFATE (2.5 MG/3ML) 0.083% IN NEBU
2.5000 mg | INHALATION_SOLUTION | Freq: Once | RESPIRATORY_TRACT | Status: AC
  Administered 2015-11-06: 2.5 mg via RESPIRATORY_TRACT

## 2015-11-06 NOTE — Assessment & Plan Note (Signed)
He says that Breo helped significantly. Today he is wheezing on exam today. I think this is due primarily to his bronchiectasis in his asthma.  Plan: Breo 1 puff daily Albuterol as detailed below

## 2015-11-06 NOTE — Patient Instructions (Signed)
Use the therapy vest twice a day, no matter how you feel If you find that it is not helping you produce more mucus then take albuterol with the therapy vest Try taking Mucinex over-the-counter (guaifenesin 600 mg extended-release) twice a day Take Breo 1 puff daily, no matter how you feel Provide [place] with a sample of your mucus We will see you back in 6 weeks with either myself or the nurse practitioner

## 2015-11-06 NOTE — Assessment & Plan Note (Signed)
Though he has rheumatoid arthritis I cannot see convincing evidence of interstitial lung disease on his CT chest. The radiologist did question this however. I think most of the changes are related to his prior severe episode of pneumonia.  Plan: To be safe, we'll plan on doing and PFT and CT scan of his chest on an annual basis, next in September 2018

## 2015-11-06 NOTE — Progress Notes (Signed)
Subjective:    Patient ID: [name], male    DOB: 08/02/1930, 82 y.o.   MRN: [id number]  Synopsis: Former patient of Dr. Joya Gaskins who has bronchiectasis and asthma. Also has rheumatoid arthritis. PFTs 01/03/14: FeV1 94% FVC 79% Fev1/FVC 84% TLC 86% DLCO 64% He notes that he had severe pneumonia when he fought in the [place] war and ended up coming home form the war and he had to be admitted to [facility] where he was cared for several months.  October 2017 pulmonary function testing FVC 2.99 L 83% predicted, FEV1 2.60 L 104% predicted, normal ratio, total lung capacity 6.08 L 91% predicted, DLCO 23.4 78% predicted September 2017 CT chest shows predominantly lower lobe bronchiectasis changes with nonspecific fibrotic changes most prominent near and osteophyte in the right lower lobe as well as some nonspecific interstitial Septal thickening, some groundglass but very mild area  HPI Chief Complaint  Patient presents with  . Follow-up    pt had pft today. pt has wheezing with activity. and pt has shortness of breath.     HE is still bringing up a lot of mucus.  He has been using his flutter valve 2-3 times per day but he still feels congestion and has some wheezing despite this.  He says that he still has some dyspnea and wheezing when he exerts himself like when he gets up in the middle of the night.  He will have dyspnea when he walks short distances and he still has congestion in his chest.  Doesn't any form of an expectorant.  The Breo helped when he took it.  He uses albuterol nebulizer 2-3 times per week.      Past Medical History:  Diagnosis Date  . Anemia   . Atrial fibrillation ([place]) 12/08/2013  . Cataract   . CHF (congestive heart failure) ([place])   . Chronic kidney disease   . Constipation    takes stool softener daily  . Dysrhythmia    a-fib  . GERD (gastroesophageal reflux disease)    takes Zantac daily   . History of blood transfusion    "related  to one of my ORs"  . Hyperlipidemia    takes Pravastatin daily  . Hypertension    takes Cardizem,Proscar,and Lisinopril daily  . Hypothyroidism   . Impaired hearing    wears hearing aids  . Joint pain   . Joint swelling   . Peripheral neuropathy ([place])   . Rheumatoid arthritis ([place])    "all over; take orencia  q 4 wks" (02/01/2013)  . Type II diabetes mellitus (HCC)    takes Metformin daily      Review of Systems  Constitutional: Negative for diaphoresis, fatigue and fever.  HENT: Negative for nosebleeds, postnasal drip and rhinorrhea.   Respiratory: Positive for chest tightness, shortness of breath and wheezing. Negative for stridor.   Cardiovascular: Negative for chest pain, palpitations and leg swelling.       Objective:   Physical Exam Vitals:   11/06/15 1556 11/06/15 1600  BP: (!) 150/70 130/60  Pulse: 95   SpO2: 94%   Weight: 212 lb 3.2 oz (96.3 kg)   Height: 5\' 8"  (1.727 m)   RA  Gen: well appearing HENT: OP clear, neck supple PULM: Wheezing lower lobes bilaterally left greater than right, normal percussion CV: RRR, systolic murmur noted, trace edema GI: BS+, soft, nontender Derm: no cyanosis or rash Psyche: normal mood and affect   CT chest September  2017 images personally reviewed, see my description above PFTs from today reviewed, see summarization above     Assessment & Plan:  Chronic obstructive airway disease with asthma ([place]) He says that Breo helped significantly. Today he is wheezing on exam today. I think this is due primarily to his bronchiectasis in his asthma.  Plan: Breo 1 puff daily Albuterol as detailed below  Bronchiectasis without acute exacerbation ([place]) I have first reviewed the images from his CT chest which shows lower lobe bronchiectasis bilaterally. He's been using his flutter valve a continues to carry a significant burden of chest congestion and shortness of breath and wheezing which is due to his ongoing mucus burden.  I  think the best approach is to enhance his mucociliary clearance with a therapy vest. I will also ask him to use guaifenesin to help thin mucus.  Plan: Start therapy vest twice a day Use albuterol twice a day Use guaifenesin twice a day Use Breo Flu shot is up-to-date Sputum culture for bacteria, fungal, AFB Follow-up in 6 weeks with either myself or the nurse practitioner  Dyspnea Though he has rheumatoid arthritis I cannot see convincing evidence of interstitial lung disease on his CT chest. The radiologist did question this however. I think most of the changes are related to his prior severe episode of pneumonia.  Plan: To be safe, we'll plan on doing and PFT and CT scan of his chest on an annual basis, next in September 2018    Current Outpatient Prescriptions:  .  Abatacept (ORENCIA IV), Inject into the vein every 30 (thirty) days., Disp: , Rfl:  .  atorvastatin (LIPITOR) 20 MG tablet, Take 1 tablet (20 mg total) by mouth daily at 6 PM. (Patient taking differently: Take 40 mg by mouth daily at 6 PM. ), Disp: 30 tablet, Rfl: 0 .  cetirizine (ZYRTEC) 10 MG tablet, Take 10 mg by mouth daily., Disp: , Rfl:  .  ELIQUIS 2.5 MG TABS tablet, Take 2.5 mg by mouth 2 (two) times daily., Disp: , Rfl: 12 .  ferrous sulfate 325 (65 FE) MG tablet, Take 325 mg by mouth daily with breakfast. , Disp: , Rfl:  .  finasteride (PROSCAR) 5 MG tablet, Take 5 mg by mouth at bedtime. , Disp: , Rfl:  .  folic acid (FOLVITE) 1 MG tablet, Take 1 mg by mouth daily.  , Disp: , Rfl:  .  furosemide (LASIX) 80 MG tablet, Take 80 mg by mouth 2 (two) times daily., Disp: , Rfl:  .  gabapentin (NEURONTIN) 300 MG capsule, Take 1 capsule (300 mg total) by mouth 2 (two) times daily., Disp: 15 capsule, Rfl: 0 .  HYDROcodone-acetaminophen (NORCO) 5-325 MG per tablet, Take 1 tablet by mouth every 6 (six) hours as needed for moderate pain., Disp: 30 tablet, Rfl: 0 .  Insulin Glargine (TOUJEO SOLOSTAR) 300 UNIT/ML SOPN, Inject  10 Units into the skin at bedtime. , Disp: , Rfl:  .  isosorbide-hydrALAZINE (BIDIL) 20-37.5 MG tablet, Take 1 tablet by mouth 3 (three) times daily., Disp: , Rfl:  .  levothyroxine (SYNTHROID, LEVOTHROID) 25 MCG tablet, Take 25 mcg by mouth daily before breakfast., Disp: , Rfl:  .  metoprolol (LOPRESSOR) 50 MG tablet, Take 25 mg by mouth 3 (three) times daily., Disp: , Rfl:  .  ranitidine (ZANTAC) 150 MG tablet, Take 150 mg by mouth 2 (two) times daily.  , Disp: , Rfl:  .  Tamsulosin HCl (FLOMAX) 0.4 MG CAPS, Take 0.4 mg by  mouth daily after supper. , Disp: , Rfl:  .  albuterol (PROVENTIL) (2.5 MG/3ML) 0.083% nebulizer solution, Take 3 mLs (2.5 mg total) by nebulization every 6 (six) hours as needed for wheezing or shortness of breath., Disp: 360 mL, Rfl: 11 .  fluticasone furoate-vilanterol (BREO ELLIPTA) 100-25 MCG/INH AEPB, Inhale 1 puff into the lungs daily., Disp: 60 each, Rfl: 5

## 2015-11-06 NOTE — Assessment & Plan Note (Addendum)
I have first reviewed the images from his CT chest which shows lower lobe bronchiectasis bilaterally. He's been using his flutter valve a continues to carry a significant burden of chest congestion and shortness of breath and wheezing which is due to his ongoing mucus burden.  He has noted daily mucus production for over 12 months, and had symptoms prior that dating back prior to 2015.  I think the best approach is to enhance his mucociliary clearance with a therapy vest. I will also ask him to use guaifenesin to help thin mucus.  Plan: Start therapy vest twice a day Use albuterol twice a day Use guaifenesin twice a day Use Breo Flu shot is up-to-date Sputum culture for bacteria, fungal, AFB Follow-up in 6 weeks with either myself or the nurse practitioner

## 2015-11-08 ENCOUNTER — Ambulatory Visit (HOSPITAL_BASED_OUTPATIENT_CLINIC_OR_DEPARTMENT_OTHER): Payer: Medicare Other | Admitting: Anesthesiology

## 2015-11-08 ENCOUNTER — Encounter (HOSPITAL_BASED_OUTPATIENT_CLINIC_OR_DEPARTMENT_OTHER)
Admission: RE | Disposition: A | Discharge status: Home / Self Care | Payer: Self-pay | Source: Ambulatory Visit | Attending: Orthopedic Surgery

## 2015-11-08 ENCOUNTER — Telehealth: Payer: Self-pay

## 2015-11-08 ENCOUNTER — Encounter (HOSPITAL_BASED_OUTPATIENT_CLINIC_OR_DEPARTMENT_OTHER): Payer: Self-pay | Admitting: *Deleted

## 2015-11-08 ENCOUNTER — Ambulatory Visit (HOSPITAL_BASED_OUTPATIENT_CLINIC_OR_DEPARTMENT_OTHER)
Admission: RE | Admit: 2015-11-08 | Discharge: 2015-11-08 | Disposition: A | Discharge status: Home / Self Care | Payer: Medicare Other | Source: Ambulatory Visit | Attending: Orthopedic Surgery | Admitting: Orthopedic Surgery

## 2015-11-08 DIAGNOSIS — M869 Osteomyelitis, unspecified: Secondary | ICD-10-CM

## 2015-11-08 DIAGNOSIS — Z7901 Long term (current) use of anticoagulants: Secondary | ICD-10-CM | POA: Insufficient documentation

## 2015-11-08 DIAGNOSIS — M868X7 Other osteomyelitis, ankle and foot: Secondary | ICD-10-CM | POA: Insufficient documentation

## 2015-11-08 DIAGNOSIS — I4891 Unspecified atrial fibrillation: Secondary | ICD-10-CM | POA: Insufficient documentation

## 2015-11-08 DIAGNOSIS — Z8489 Family history of other specified conditions: Secondary | ICD-10-CM | POA: Diagnosis not present

## 2015-11-08 DIAGNOSIS — I509 Heart failure, unspecified: Secondary | ICD-10-CM | POA: Diagnosis not present

## 2015-11-08 DIAGNOSIS — E1169 Type 2 diabetes mellitus with other specified complication: Secondary | ICD-10-CM | POA: Diagnosis not present

## 2015-11-08 DIAGNOSIS — J449 Chronic obstructive pulmonary disease, unspecified: Secondary | ICD-10-CM | POA: Insufficient documentation

## 2015-11-08 DIAGNOSIS — Z803 Family history of malignant neoplasm of breast: Secondary | ICD-10-CM | POA: Diagnosis not present

## 2015-11-08 DIAGNOSIS — Z87891 Personal history of nicotine dependence: Secondary | ICD-10-CM | POA: Diagnosis not present

## 2015-11-08 DIAGNOSIS — N189 Chronic kidney disease, unspecified: Secondary | ICD-10-CM | POA: Insufficient documentation

## 2015-11-08 DIAGNOSIS — Z794 Long term (current) use of insulin: Secondary | ICD-10-CM | POA: Insufficient documentation

## 2015-11-08 DIAGNOSIS — K219 Gastro-esophageal reflux disease without esophagitis: Secondary | ICD-10-CM | POA: Diagnosis not present

## 2015-11-08 DIAGNOSIS — J45909 Unspecified asthma, uncomplicated: Secondary | ICD-10-CM | POA: Diagnosis not present

## 2015-11-08 DIAGNOSIS — E039 Hypothyroidism, unspecified: Secondary | ICD-10-CM | POA: Insufficient documentation

## 2015-11-08 DIAGNOSIS — E1142 Type 2 diabetes mellitus with diabetic polyneuropathy: Secondary | ICD-10-CM | POA: Insufficient documentation

## 2015-11-08 DIAGNOSIS — E1122 Type 2 diabetes mellitus with diabetic chronic kidney disease: Secondary | ICD-10-CM | POA: Insufficient documentation

## 2015-11-08 DIAGNOSIS — H9190 Unspecified hearing loss, unspecified ear: Secondary | ICD-10-CM | POA: Insufficient documentation

## 2015-11-08 DIAGNOSIS — I10 Essential (primary) hypertension: Secondary | ICD-10-CM | POA: Diagnosis not present

## 2015-11-08 DIAGNOSIS — M069 Rheumatoid arthritis, unspecified: Secondary | ICD-10-CM | POA: Diagnosis not present

## 2015-11-08 DIAGNOSIS — Z7984 Long term (current) use of oral hypoglycemic drugs: Secondary | ICD-10-CM | POA: Insufficient documentation

## 2015-11-08 DIAGNOSIS — E119 Type 2 diabetes mellitus without complications: Secondary | ICD-10-CM | POA: Diagnosis not present

## 2015-11-08 DIAGNOSIS — I130 Hypertensive heart and chronic kidney disease with heart failure and stage 1 through stage 4 chronic kidney disease, or unspecified chronic kidney disease: Secondary | ICD-10-CM | POA: Diagnosis not present

## 2015-11-08 DIAGNOSIS — E785 Hyperlipidemia, unspecified: Secondary | ICD-10-CM | POA: Diagnosis not present

## 2015-11-08 DIAGNOSIS — M86172 Other acute osteomyelitis, left ankle and foot: Secondary | ICD-10-CM | POA: Diagnosis not present

## 2015-11-08 DIAGNOSIS — D649 Anemia, unspecified: Secondary | ICD-10-CM | POA: Diagnosis not present

## 2015-11-08 DIAGNOSIS — M86672 Other chronic osteomyelitis, left ankle and foot: Secondary | ICD-10-CM | POA: Diagnosis not present

## 2015-11-08 HISTORY — DX: Hypothyroidism, unspecified: E03.9

## 2015-11-08 HISTORY — PX: AMPUTATION TOE: SHX6595

## 2015-11-08 HISTORY — DX: Cardiac arrhythmia, unspecified: I49.9

## 2015-11-08 LAB — POCT I-STAT, CHEM 8
BUN: 38 mg/dL — AB (ref 6–20)
CALCIUM ION: 0.9 mmol/L — AB (ref 1.15–1.40)
CHLORIDE: 99 mmol/L — AB (ref 101–111)
Creatinine, Ser: 2.2 mg/dL — ABNORMAL HIGH (ref 0.61–1.24)
Glucose, Bld: 166 mg/dL — ABNORMAL HIGH (ref 65–99)
HCT: 38 % — ABNORMAL LOW (ref 39.0–52.0)
Hemoglobin: 12.9 g/dL — ABNORMAL LOW (ref 13.0–17.0)
POTASSIUM: 2.8 mmol/L — AB (ref 3.5–5.1)
SODIUM: 139 mmol/L (ref 135–145)
TCO2: 26 mmol/L (ref 0–100)

## 2015-11-08 LAB — GLUCOSE, CAPILLARY: GLUCOSE-CAPILLARY: 158 mg/dL — AB (ref 65–99)

## 2015-11-08 SURGERY — AMPUTATION, TOE
Anesthesia: General | Site: Foot | Laterality: Left

## 2015-11-08 MED ORDER — CHLORHEXIDINE GLUCONATE 4 % EX LIQD: 60.0000 mL | Freq: Once | CUTANEOUS | Status: DC

## 2015-11-08 MED ORDER — MIDAZOLAM HCL 2 MG/2ML IJ SOLN: 1.0000 mg | INTRAMUSCULAR | 0 refills | Status: DC | PRN

## 2015-11-08 MED ORDER — HYDROCODONE-ACETAMINOPHEN 5-325 MG PO TABS
ORAL_TABLET | ORAL | Status: AC
  Filled 2015-11-08: qty 1

## 2015-11-08 MED ORDER — FENTANYL CITRATE (PF) 100 MCG/2ML IJ SOLN
INTRAMUSCULAR | Status: AC
  Filled 2015-11-08: qty 2

## 2015-11-08 MED ORDER — HYDROCODONE-ACETAMINOPHEN 7.5-325 MG PO TABS: 1.0000 | ORAL_TABLET | Freq: Once | ORAL | Status: DC | PRN

## 2015-11-08 MED ORDER — MIDAZOLAM HCL 2 MG/2ML IJ SOLN: 1.0000 mg | INTRAMUSCULAR | Status: DC | PRN

## 2015-11-08 MED ORDER — SCOPOLAMINE 1 MG/3DAYS TD PT72: 1.0000 | MEDICATED_PATCH | Freq: Once | TRANSDERMAL | Status: DC | PRN

## 2015-11-08 MED ORDER — LIDOCAINE 2% (20 MG/ML) 5 ML SYRINGE
INTRAMUSCULAR | Status: AC
  Filled 2015-11-08: qty 5

## 2015-11-08 MED ORDER — SCOPOLAMINE 1 MG/3DAYS TD PT72: 1.0000 | MEDICATED_PATCH | Freq: Once | TRANSDERMAL | 12 refills | Status: DC | PRN

## 2015-11-08 MED ORDER — FENTANYL CITRATE (PF) 100 MCG/2ML IJ SOLN: 50.0000 ug | INTRAMUSCULAR | Status: DC | PRN

## 2015-11-08 MED ORDER — ONDANSETRON HCL 4 MG/2ML IJ SOLN: 4.0000 mg | Freq: Once | INTRAMUSCULAR | Status: DC | PRN

## 2015-11-08 MED ORDER — FENTANYL CITRATE (PF) 100 MCG/2ML IJ SOLN
INTRAMUSCULAR | Status: DC | PRN
  Administered 2015-11-08: 100 ug via INTRAVENOUS

## 2015-11-08 MED ORDER — FENTANYL CITRATE (PF) 100 MCG/2ML IJ SOLN
25.0000 ug | INTRAMUSCULAR | Status: DC | PRN
  Administered 2015-11-08: 25 ug via INTRAVENOUS
  Administered 2015-11-08: 50 ug via INTRAVENOUS

## 2015-11-08 MED ORDER — VANCOMYCIN HCL 500 MG IV SOLR
INTRAVENOUS | Status: DC | PRN
  Administered 2015-11-08: 500 mg via TOPICAL

## 2015-11-08 MED ORDER — PROPOFOL 10 MG/ML IV BOLUS
INTRAVENOUS | Status: DC | PRN
  Administered 2015-11-08: 150 mg via INTRAVENOUS

## 2015-11-08 MED ORDER — ONDANSETRON HCL 4 MG/2ML IJ SOLN
INTRAMUSCULAR | Status: AC
  Filled 2015-11-08: qty 2

## 2015-11-08 MED ORDER — LACTATED RINGERS IV SOLN
INTRAVENOUS | Status: DC
  Administered 2015-11-08: 13:00:00 via INTRAVENOUS

## 2015-11-08 MED ORDER — SODIUM CHLORIDE 0.9 % IV SOLN: INTRAVENOUS | Status: DC

## 2015-11-08 MED ORDER — GLYCOPYRROLATE 0.2 MG/ML IJ SOLN: 0.2000 mg | Freq: Once | INTRAMUSCULAR | Status: DC | PRN

## 2015-11-08 MED ORDER — CEFAZOLIN SODIUM-DEXTROSE 2-4 GM/100ML-% IV SOLN
2.0000 g | INTRAVENOUS | Status: AC
  Administered 2015-11-08: 2 g via INTRAVENOUS

## 2015-11-08 MED ORDER — FENTANYL CITRATE (PF) 100 MCG/2ML IJ SOLN: 50.0000 ug | INTRAMUSCULAR | 0 refills | Status: DC | PRN

## 2015-11-08 MED ORDER — DEXAMETHASONE SODIUM PHOSPHATE 10 MG/ML IJ SOLN
INTRAMUSCULAR | Status: AC
  Filled 2015-11-08: qty 1

## 2015-11-08 MED ORDER — LIDOCAINE HCL (CARDIAC) 20 MG/ML IV SOLN
INTRAVENOUS | Status: DC | PRN
  Administered 2015-11-08: 30 mg via INTRAVENOUS

## 2015-11-08 MED ORDER — CEFAZOLIN SODIUM-DEXTROSE 2-4 GM/100ML-% IV SOLN
INTRAVENOUS | Status: AC
  Filled 2015-11-08: qty 100

## 2015-11-08 MED ORDER — DEXAMETHASONE SODIUM PHOSPHATE 10 MG/ML IJ SOLN
INTRAMUSCULAR | Status: DC | PRN
  Administered 2015-11-08: 10 mg via INTRAVENOUS

## 2015-11-08 MED ORDER — PROPOFOL 10 MG/ML IV BOLUS
INTRAVENOUS | Status: AC
  Filled 2015-11-08: qty 20

## 2015-11-08 MED ORDER — HYDROCODONE-ACETAMINOPHEN 5-325 MG PO TABS
1.0000 | ORAL_TABLET | Freq: Once | ORAL | Status: AC
Start: 2015-11-08 — End: 2015-11-08
  Administered 2015-11-08: 1 via ORAL

## 2015-11-08 MED ORDER — VANCOMYCIN HCL 500 MG IV SOLR
INTRAVENOUS | Status: AC
  Filled 2015-11-08: qty 500

## 2015-11-08 SURGICAL SUPPLY — 63 items
BLADE AVERAGE 25MMX9MM (BLADE)
BLADE AVERAGE 25X9 (BLADE) IMPLANT
BLADE OSC/SAG .038X5.5 CUT EDG (BLADE) IMPLANT
BLADE SURG 10 STRL SS (BLADE) IMPLANT
BLADE SURG 15 STRL LF DISP TIS (BLADE) ×1 IMPLANT
BLADE SURG 15 STRL SS (BLADE) ×3
BNDG CMPR 9X4 STRL LF SNTH (GAUZE/BANDAGES/DRESSINGS) ×1
BNDG COHESIVE 4X5 TAN STRL (GAUZE/BANDAGES/DRESSINGS) ×3 IMPLANT
BNDG CONFORM 3 STRL LF (GAUZE/BANDAGES/DRESSINGS) IMPLANT
BNDG ESMARK 4X9 LF (GAUZE/BANDAGES/DRESSINGS) ×3 IMPLANT
CHLORAPREP W/TINT 26ML (MISCELLANEOUS) ×3 IMPLANT
COVER BACK TABLE 60X90IN (DRAPES) ×3 IMPLANT
DECANTER SPIKE VIAL GLASS SM (MISCELLANEOUS) IMPLANT
DRAPE EXTREMITY T 121X128X90 (DRAPE) ×3 IMPLANT
DRAPE SURG 17X23 STRL (DRAPES) ×3 IMPLANT
DRAPE U-SHAPE 47X51 STRL (DRAPES) ×3 IMPLANT
DRSG EMULSION OIL 3X3 NADH (GAUZE/BANDAGES/DRESSINGS) IMPLANT
DRSG MEPITEL 4X7.2 (GAUZE/BANDAGES/DRESSINGS) ×3 IMPLANT
DRSG PAD ABDOMINAL 8X10 ST (GAUZE/BANDAGES/DRESSINGS) ×2 IMPLANT
ELECT REM PT RETURN 9FT ADLT (ELECTROSURGICAL) ×3
ELECTRODE REM PT RTRN 9FT ADLT (ELECTROSURGICAL) ×1 IMPLANT
GAUZE SPONGE 4X4 12PLY STRL (GAUZE/BANDAGES/DRESSINGS) ×3 IMPLANT
GLOVE BIO SURGEON STRL SZ7.5 (GLOVE) ×2 IMPLANT
GLOVE BIO SURGEON STRL SZ8 (GLOVE) ×5 IMPLANT
GLOVE BIOGEL PI IND STRL 8 (GLOVE) ×2 IMPLANT
GLOVE BIOGEL PI INDICATOR 8 (GLOVE) ×6
GLOVE ECLIPSE 7.5 STRL STRAW (GLOVE) ×3 IMPLANT
GLOVE EXAM NITRILE MD LF STRL (GLOVE) IMPLANT
GLOVE SURG SS PI 7.5 STRL IVOR (GLOVE) ×2 IMPLANT
GOWN STRL REUS W/ TWL LRG LVL3 (GOWN DISPOSABLE) ×1 IMPLANT
GOWN STRL REUS W/ TWL XL LVL3 (GOWN DISPOSABLE) ×2 IMPLANT
GOWN STRL REUS W/TWL LRG LVL3 (GOWN DISPOSABLE) ×3
GOWN STRL REUS W/TWL XL LVL3 (GOWN DISPOSABLE) ×6
NDL HYPO 25X1 1.5 SAFETY (NEEDLE) IMPLANT
NDL SAFETY ECLIPSE 18X1.5 (NEEDLE) IMPLANT
NEEDLE HYPO 18GX1.5 SHARP (NEEDLE)
NEEDLE HYPO 25X1 1.5 SAFETY (NEEDLE) IMPLANT
NS IRRIG 1000ML POUR BTL (IV SOLUTION) ×3 IMPLANT
PACK BASIN DAY SURGERY FS (CUSTOM PROCEDURE TRAY) ×3 IMPLANT
PAD CAST 4YDX4 CTTN HI CHSV (CAST SUPPLIES) ×1 IMPLANT
PADDING CAST ABS 4INX4YD NS (CAST SUPPLIES)
PADDING CAST ABS COTTON 4X4 ST (CAST SUPPLIES) IMPLANT
PADDING CAST COTTON 4X4 STRL (CAST SUPPLIES) ×3
PENCIL BUTTON HOLSTER BLD 10FT (ELECTRODE) ×3 IMPLANT
SANITIZER HAND PURELL 535ML FO (MISCELLANEOUS) ×3 IMPLANT
SHEET MEDIUM DRAPE 40X70 STRL (DRAPES) ×3 IMPLANT
SLEEVE SCD COMPRESS KNEE MED (MISCELLANEOUS) ×3 IMPLANT
SPONGE LAP 18X18 X RAY DECT (DISPOSABLE) ×3 IMPLANT
STOCKINETTE 6  STRL (DRAPES) ×2
STOCKINETTE 6 STRL (DRAPES) ×1 IMPLANT
SUCTION FRAZIER HANDLE 10FR (MISCELLANEOUS)
SUCTION TUBE FRAZIER 10FR DISP (MISCELLANEOUS) IMPLANT
SUT ETHILON 2 0 FS 18 (SUTURE) ×4 IMPLANT
SUT ETHILON 2 0 FSLX (SUTURE) IMPLANT
SUT ETHILON 3 0 PS 1 (SUTURE) ×3 IMPLANT
SUT MNCRL AB 3-0 PS2 18 (SUTURE) IMPLANT
SWAB COLLECTION DEVICE MRSA (MISCELLANEOUS) IMPLANT
SYR BULB 3OZ (MISCELLANEOUS) ×3 IMPLANT
SYR CONTROL 10ML LL (SYRINGE) IMPLANT
TOWEL OR 17X24 6PK STRL BLUE (TOWEL DISPOSABLE) ×3 IMPLANT
TUBE CONNECTING 20'X1/4 (TUBING)
TUBE CONNECTING 20X1/4 (TUBING) IMPLANT
UNDERPAD 30X30 (UNDERPADS AND DIAPERS) ×3 IMPLANT

## 2015-11-08 NOTE — Transfer of Care (Signed)
Immediate Anesthesia Transfer of Care Note  Patient: [name]  Procedure(s) Performed: Procedure(s): LEFT HALLUX AMPUTATION AT THE METATARSOPHALANGEAL JOINT (Left)  Patient Location: PACU  Anesthesia Type:General  Level of Consciousness: awake and patient cooperative  Airway & Oxygen Therapy: Patient Spontanous Breathing and Patient connected to face mask oxygen  Post-op Assessment: Report given to RN and Post -op Vital signs reviewed and stable  Post vital signs: Reviewed and stable  Last Vitals:  Vitals:   11/08/15 1233  BP: (!) 164/72  Pulse: (!) 105  Resp: 18  Temp: 36.5 C    Last Pain:  Vitals:   11/08/15 1233  TempSrc: Oral  PainSc: 5       Patients Stated Pain Goal: 3 ([id number])  Complications: No apparent anesthesia complications

## 2015-11-08 NOTE — Brief Op Note (Signed)
11/08/2015  2:24 PM  PATIENT:  [name]  82 y.o. male  PRE-OPERATIVE DIAGNOSIS:  LEFT HALLUX OSTEOMELITIS  POST-OPERATIVE DIAGNOSIS:  LEFT HALLUX OSTEOMELITIS  Procedure(s): LEFT HALLUX AMPUTATION AT THE METATARSOPHALANGEAL JOINT  SURGEON:  Wylene Simmer, MD  ASSISTANT: n/a  ANESTHESIA:   General  EBL:  minimal   TOURNIQUET:   Total Tourniquet Time Documented: Thigh (Left) - 12 minutes Total: Thigh (Left) - 12 minutes  COMPLICATIONS:  None apparent  DISPOSITION:  Extubated, awake and stable to recovery.  DICTATION ID:  [id number]

## 2015-11-08 NOTE — Telephone Encounter (Signed)
lmtcb X1 for pt to see how long he has been coughing.

## 2015-11-08 NOTE — Anesthesia Procedure Notes (Addendum)
Procedure Name: LMA Insertion Date/Time: 11/08/2015 1:50 PM Performed by: Toula Moos L Pre-anesthesia Checklist: Patient identified, Emergency Drugs available, Suction available, Patient being monitored and Timeout performed Patient Re-evaluated:Patient Re-evaluated prior to inductionOxygen Delivery Method: Circle system utilized Preoxygenation: Pre-oxygenation with 100% oxygen Intubation Type: IV induction Ventilation: Mask ventilation without difficulty LMA: LMA inserted LMA Size: 5.0 Number of attempts: 1 Airway Equipment and Method: Bite block Placement Confirmation: positive ETCO2 Tube secured with: Tape Dental Injury: Teeth and Oropharynx as per pre-operative assessment

## 2015-11-08 NOTE — Anesthesia Postprocedure Evaluation (Signed)
Anesthesia Post Note  Patient: [name]  Procedure(s) Performed: Procedure(s) (LRB): LEFT HALLUX AMPUTATION AT THE METATARSOPHALANGEAL JOINT (Left)  Patient location during evaluation: PACU Anesthesia Type: General Level of consciousness: awake and alert Pain management: pain level controlled Vital Signs Assessment: post-procedure vital signs reviewed and stable Respiratory status: spontaneous breathing, nonlabored ventilation, respiratory function stable and patient connected to nasal cannula oxygen Cardiovascular status: blood pressure returned to baseline and stable Postop Assessment: no signs of nausea or vomiting Anesthetic complications: no    Last Vitals:  Vitals:   11/08/15 1500 11/08/15 1515  BP: (!) 145/75 (!) 143/78  Pulse: 74 74  Resp: 15 (!) 9  Temp:      Last Pain:  Vitals:   11/08/15 1500  TempSrc:   PainSc: 5                  Zenaida Deed

## 2015-11-08 NOTE — Anesthesia Preprocedure Evaluation (Addendum)
Anesthesia Evaluation  Patient identified by MRN, date of birth, ID band Patient awake    Reviewed: Allergy & Precautions, H&P , NPO status , Patient's Chart, lab work & pertinent test results  Airway Mallampati: II  TM Distance: >3 FB Neck ROM: Full    Dental no notable dental hx. (+) Upper Dentures, Lower Dentures, Dental Advisory Given   Pulmonary asthma , COPD, former smoker,    Pulmonary exam normal breath sounds clear to auscultation       Cardiovascular hypertension, On Medications + dysrhythmias Atrial Fibrillation  Rhythm:Regular Rate:Normal  2016 Echo with normal EF 50%, elevated pulmonary pressures per echo, no cath confirmation   Neuro/Psych  Neuromuscular disease negative psych ROS   GI/Hepatic Neg liver ROS, GERD  Controlled and Medicated,  Endo/Other  diabetes, Type 2, Oral Hypoglycemic Agents  Renal/GU Renal InsufficiencyRenal disease  negative genitourinary   Musculoskeletal  (+) Arthritis , Rheumatoid disorders,    Abdominal   Peds  Hematology  (+) anemia ,   Anesthesia Other Findings   Reproductive/Obstetrics negative OB ROS                            Anesthesia Physical  Anesthesia Plan  ASA: III  Anesthesia Plan: General   Post-op Pain Management:    Induction: Intravenous  Airway Management Planned: LMA  Additional Equipment:   Intra-op Plan:   Post-operative Plan:   Informed Consent: I have reviewed the patients History and Physical, chart, labs and discussed the procedure including the risks, benefits and alternatives for the proposed anesthesia with the patient or authorized representative who has indicated his/her understanding and acceptance.   Dental advisory given  Plan Discussed with: CRNA  Anesthesia Plan Comments:        Anesthesia Quick Evaluation

## 2015-11-08 NOTE — H&P (Signed)
[name] is an 82 y.o. male.   Chief Complaint: left forefoot pain and swelling HPI:  82 y/o male with PMH of RA c/o chronic left hallux swelling.  He has a h/o a fusion at the IP joint.  He has been on abx and took his last dose this morning.  Past Medical History:  Diagnosis Date  . Anemia   . Atrial fibrillation ([place]) 12/08/2013  . Cataract   . CHF (congestive heart failure) ([place])   . Chronic kidney disease   . Constipation    takes stool softener daily  . Dysrhythmia    a-fib  . GERD (gastroesophageal reflux disease)    takes Zantac daily   . History of blood transfusion    "related to one of my ORs"  . Hyperlipidemia    takes Pravastatin daily  . Hypertension    takes Cardizem,Proscar,and Lisinopril daily  . Hypothyroidism   . Impaired hearing    wears hearing aids  . Joint pain   . Joint swelling   . Peripheral neuropathy ([place])   . Rheumatoid arthritis ([place])    "all over; take orencia  q 4 wks" (02/01/2013)  . Type II diabetes mellitus ([facility])    takes Metformin daily    Past Surgical History:  Procedure Laterality Date  . ANKLE FUSION Left 2014  . BIOPSY THYROID  2014  . CARPAL TUNNEL RELEASE Bilateral 1990's  . CATARACT EXTRACTION W/ INTRAOCULAR LENS  IMPLANT, BILATERAL Bilateral 1990's  . COLONOSCOPY    . FOREIGN BODY REMOVAL Right 09/06/2013   Procedure: REMOVAL FOREIGN BODY RIGHT INDEX FINGER;  Surgeon: Daryll Brod, MD;  Location: [facility];  Service: Orthopedics;  Laterality: Right;  ANESTHESIA: IV REGIONAL FAB  . REPAIR TENDONS FOOT Left 1946; 1984   "farming accident; repaired tendons both times"  . SHOULDER ARTHROSCOPY W/ ROTATOR CUFF REPAIR Right 1990's  . THYROIDECTOMY Right 02/01/2013   Procedure: RIGHT THYROIDECTOMY LOBECTOMY;  Surgeon: Earnstine Regal, MD;  Location: [facility];  Service: General;  Laterality: Right;  . THYROIDECTOMY, PARTIAL Right 02/01/2013  . TONSILLECTOMY  1930's  . TOTAL KNEE ARTHROPLASTY Right 12/16/2010   Procedure:  TOTAL KNEE ARTHROPLASTY; rt Surgeon: Rudean Haskell, MD;  Location: [facility];  Service: Orthopedics;  Laterality: Right;  . TOTAL SHOULDER REPLACEMENT Left 03/2010    Family History  Problem Relation Age of Onset  . Cancer Mother     breast  . Aneurysm Father    Social History:  reports that he quit smoking about 50 years ago. His smoking use included Cigarettes. He has a 15.00 pack-year smoking history. He has never used smokeless tobacco. He reports that he does not drink alcohol or use drugs.  Allergies: No Known Allergies  Medications Prior to Admission  Medication Sig Dispense Refill  . Abatacept (ORENCIA IV) Inject into the vein every 30 (thirty) days.    [name] albuterol (PROVENTIL) (2.5 MG/3ML) 0.083% nebulizer solution Take 3 mLs (2.5 mg total) by nebulization every 6 (six) hours as needed for wheezing or shortness of breath. 360 mL 11  . atorvastatin (LIPITOR) 20 MG tablet Take 1 tablet (20 mg total) by mouth daily at 6 PM. (Patient taking differently: Take 40 mg by mouth daily at 6 PM. ) 30 tablet 0  . cetirizine (ZYRTEC) 10 MG tablet Take 10 mg by mouth daily.    [name] ELIQUIS 2.5 MG TABS tablet Take 2.5 mg by mouth 2 (two) times daily.  12  . ferrous  sulfate 325 (65 FE) MG tablet Take 325 mg by mouth daily with breakfast.     . finasteride (PROSCAR) 5 MG tablet Take 5 mg by mouth at bedtime.     . fluticasone furoate-vilanterol (BREO ELLIPTA) 100-25 MCG/INH AEPB Inhale 1 puff into the lungs daily. 60 each 5  . folic acid (FOLVITE) 1 MG tablet Take 1 mg by mouth daily.      . furosemide (LASIX) 80 MG tablet Take 80 mg by mouth 2 (two) times daily.    [name] gabapentin (NEURONTIN) 300 MG capsule Take 1 capsule (300 mg total) by mouth 2 (two) times daily. 15 capsule 0  . HYDROcodone-acetaminophen (NORCO) 5-325 MG per tablet Take 1 tablet by mouth every 6 (six) hours as needed for moderate pain. 30 tablet 0  . Insulin Glargine (TOUJEO SOLOSTAR) 300 UNIT/ML SOPN Inject 10 Units into the skin at  bedtime.     . isosorbide-hydrALAZINE (BIDIL) 20-37.5 MG tablet Take 1 tablet by mouth 3 (three) times daily.    [name] levothyroxine (SYNTHROID, LEVOTHROID) 25 MCG tablet Take 25 mcg by mouth daily before breakfast.    . metoprolol (LOPRESSOR) 50 MG tablet Take 25 mg by mouth 3 (three) times daily.    . ranitidine (ZANTAC) 150 MG tablet Take 150 mg by mouth 2 (two) times daily.      . Tamsulosin HCl (FLOMAX) 0.4 MG CAPS Take 0.4 mg by mouth daily after supper.       Results for orders placed or performed during the hospital encounter of 11/09/2015 (from the past 48 hour(s))  I-STAT, chem 8     Status: Abnormal   Collection Time: 2015-11-09 12:57 PM  Result Value Ref Range   Sodium 139 135 - 145 mmol/L   Potassium 2.8 (L) 3.5 - 5.1 mmol/L   Chloride 99 (L) 101 - 111 mmol/L   BUN 38 (H) 6 - 20 mg/dL   Creatinine, Ser 2.20 (H) 0.61 - 1.24 mg/dL   Glucose, Bld 166 (H) 65 - 99 mg/dL   Calcium, Ion 0.90 (L) 1.15 - 1.40 mmol/L   TCO2 26 0 - 100 mmol/L   Hemoglobin 12.9 (L) 13.0 - 17.0 g/dL   HCT 38.0 (L) 39.0 - 52.0 %   No results found.  ROS  No recent f/c/n/v/wt loss  Blood pressure (!) 164/72, pulse (!) 105, temperature 97.7 F (36.5 C), temperature source Oral, resp. rate 18, height 5\' 8"  (1.727 m), weight 94.8 kg (209 lb), SpO2 100 %. Physical Exam  wn wd elderly male in nad.  A and O x 4.  Mood and affect normal.  EOMI.  Resp unlabored.  L hallux swollen.  No ulceration.  Skin intact.  Mild erythema.  Sens to LT intact.    Xray shows osteolysis of the tuft of the left hallux  Assessment/Plan L hallux osteomyelitis - to OR for left hallux amputation at the MP joint.  The risks and benefits of the alternative treatment options have been discussed in detail.  The patient wishes to proceed with surgery and specifically understands risks of bleeding, infection, nerve damage, blood clots, need for additional surgery, amputation and death.   Wylene Simmer, MD 11/09/2015, 1:37 PM

## 2015-11-08 NOTE — Discharge Instructions (Addendum)
William Simmer, MD [facility]  Please read the following information regarding your care after surgery.  Medications  You only need a prescription for the narcotic pain medicine (ex. oxycodone, Percocet, Norco).  All of the other medicines listed below are available over the counter. acetominophen (Tylenol) 650 mg every 4-6 hours as you need for minor pain OR Hydrocodone for moderate to severe pain  X To help prevent blood clots, resume your blood thinner.  You should also get up every hour while you are awake to move around.    Weight Bearing X Bear weight when you are able on your operated leg or foot in the post-op shoe.  Cast / Splint / Dressing X Keep your dressing clean and dry.  Dont put anything (coat hanger, pencil, etc) down inside of it.  If it gets damp, use a hair dryer on the cool setting to dry it.  If it gets soaked, call the office to schedule an appointment for a dressing change.  After your dressing, cast or splint is removed; you may shower, but do not soak or scrub the wound.  Allow the water to run over it, and then gently pat it dry.  Swelling It is normal for you to have swelling where you had surgery.  To reduce swelling and pain, keep your toes above your nose for at least 3 days after surgery.  It may be necessary to keep your foot or leg elevated for several weeks.  If it hurts, it should be elevated.  Follow Up Call my office at [phone] when you are discharged from the hospital or surgery center to schedule an appointment to be seen two weeks after surgery.  Call my office at [phone] if you develop a fever >101.5 F, nausea, vomiting, bleeding from the surgical site or severe pain.       Post Anesthesia Home Care Instructions  Activity: Get plenty of rest for the remainder of the day. A responsible adult should stay with you for 24 hours following the procedure.  For the next 24 hours, DO NOT: -Drive a car -Conservation officer, nature -Drink alcoholic beverages -Take any medication unless instructed by your physician -Make any legal decisions or sign important papers.  Meals: Start with liquid foods such as gelatin or soup. Progress to regular foods as tolerated. Avoid greasy, spicy, heavy foods. If nausea and/or vomiting occur, drink only clear liquids until the nausea and/or vomiting subsides. Call your physician if vomiting continues.  Special Instructions/Symptoms: Your throat may feel dry or sore from the anesthesia or the breathing tube placed in your throat during surgery. If this causes discomfort, gargle with warm salt water. The discomfort should disappear within 24 hours.  If you had a scopolamine patch placed behind your ear for the management of post- operative nausea and/or vomiting:  1. The medication in the patch is effective for 72 hours, after which it should be removed.  Wrap patch in a tissue and discard in the trash. Wash hands thoroughly with soap and water. 2. You may remove the patch earlier than 72 hours if you experience unpleasant side effects which may include dry mouth, dizziness or visual disturbances. 3. Avoid touching the patch. Wash your hands with soap and water after contact with the patch.

## 2015-11-08 NOTE — Telephone Encounter (Signed)
-----   Message from Juanito Doom, MD sent at 11/08/2015  1:26 PM EDT ----- [name] you mind calling him and asking him how long he has been coughing up mucus like this?  Thanks B ----- Message ----- From: Darlina Guys Sent: 11/08/2015   9:16 AM To: Juanito Doom, MD  Good morning Dr. [place] Bells.  We are working on your vest referral for this patient and we're needing one additional piece of information.  Medicare needs to see how long the patient has been having a daily productive cough or daily production of mucus.  They are looking for at least 6 continuous months.   Your last two office notes from August and October clearly document daily production but there are no notes prior to August in 2017.  Would you be able to reach out to the patient to ask him how long he has been able to produce daily mucus and then add that information to your most recent office visit note?  Everything else looks great for this referral.  We are ready to go once we get this documentation. Thanks so much!  [place] [phone]

## 2015-11-09 ENCOUNTER — Encounter (HOSPITAL_BASED_OUTPATIENT_CLINIC_OR_DEPARTMENT_OTHER): Payer: Self-pay | Admitting: Orthopedic Surgery

## 2015-11-09 NOTE — Op Note (Signed)
NAME:  [name], BAKEMAN NO.:  [id number]  MEDICAL RECORD NO.:  [id number]  LOCATION:                                 FACILITY:  PHYSICIAN:  Wylene Simmer, MD        DATE OF BIRTH:  05/14/1930  DATE OF PROCEDURE:  11/08/2015 DATE OF DISCHARGE:                              OPERATIVE REPORT   PREOPERATIVE DIAGNOSIS:  Left hallux osteomyelitis.  POSTOPERATIVE DIAGNOSIS:  Left hallux osteomyelitis.  PROCEDURE:  Left hallux amputation at the metatarsophalangeal joint.  SURGEON:  Wylene Simmer, M.D.  ANESTHESIA:  General.  ESTIMATED BLOOD LOSS:  Minimal.  TOURNIQUET TIME:  12 minutes with an ankle Esmarch.  COMPLICATIONS:  None apparent.  DISPOSITION:  Extubated, awake, and stable to recovery.  INDICATIONS FOR PROCEDURE:  The patient is an 82 year old male with past medical history significant for rheumatoid arthritis and diabetes.  He underwent interphalangeal joint arthrodesis several years ago.  He has developed chronic swelling and pain at the hallux.  Radiographs show osteolysis at the distal phalanx, consistent with osteomyelitis.  He presents today for amputation of the hallux through the MTP joint.  He has been on oral antibiotics.  He understands the risks and benefits of the alternative treatment options and elects surgical treatment.  He specifically understands risks of bleeding, infection, nerve damage, blood clots, need for additional surgery, continued pain, revision, amputation, and death.  PROCEDURE IN DETAIL:  After preoperative consent was obtained, the correct operative site was identified, the patient was brought to the operating room and placed supine on the operating table.  General anesthesia was induced.  Preoperative antibiotics were administered. Surgical time-out was taken.  Left lower extremity was prepped and draped in standard sterile fashion.  Foot was exsanguinated and a 4-inch Esmarch tourniquet was wrapped around the ankle.  A  racket style incision was marked on the skin adjacent to the base of the left hallux. The incision was made and sharp dissection was carried down through the skin, subcutaneous tissues to the level of the bone of the proximal phalanx.  Subperiosteal dissection was then carried down along the base of the proximal phalanx to the level of the MTP joint.  The toe was disarticulated through the MTP joint and passed off the field. Neurovascular bundles were all cauterized.  The soft tissue was carefully examined and there was no evidence of any purulent fluid. There was no evidence of any necrotic material.  The remainder of the wound appeared healthy.  The wound was irrigated copiously.  Vancomycin powder was sprinkled in the wound and the incision was closed with simple and horizontal mattress sutures of 2-0 nylon.  Sterile dressings were applied followed by a compression wrap.  Tourniquet was released after application of the dressings at 12 minutes.  The patient was awakened from anesthesia and transported to the recovery room in stable condition.  FOLLOWUP PLAN:  The patient will be weightbearing as tolerated on his left foot in a flat postop shoe.  He will follow up with me in the office in 2 weeks for suture removal.     Wylene Simmer, MD     JH/MEDQ  D:  11/08/2015  T:  11/09/2015  Job:  [id number]

## 2015-11-12 NOTE — Telephone Encounter (Signed)
I believe I was updating his last note with me when you typed this!

## 2015-11-12 NOTE — Telephone Encounter (Signed)
Edreen returned call Pt has been experiencing these symptoms since first seen PW 02/2014 = 1.54yrs but his symptoms have worsened with dyspnea since April 2017.  Will route back to BQ to ensure this information can be relayed to [facility]

## 2015-11-12 NOTE — Telephone Encounter (Signed)
lmtcb X2 for pt to check on symptoms

## 2015-11-21 DIAGNOSIS — Z4789 Encounter for other orthopedic aftercare: Secondary | ICD-10-CM | POA: Diagnosis not present

## 2015-11-28 DIAGNOSIS — Z4789 Encounter for other orthopedic aftercare: Secondary | ICD-10-CM | POA: Diagnosis not present

## 2015-11-28 DIAGNOSIS — M8660 Other chronic osteomyelitis, unspecified site: Secondary | ICD-10-CM | POA: Diagnosis not present

## 2015-12-01 ENCOUNTER — Emergency Department (HOSPITAL_COMMUNITY): Payer: Medicare Other

## 2015-12-01 ENCOUNTER — Inpatient Hospital Stay (HOSPITAL_COMMUNITY): Payer: Medicare Other

## 2015-12-01 ENCOUNTER — Encounter (HOSPITAL_COMMUNITY): Payer: Self-pay | Admitting: *Deleted

## 2015-12-01 ENCOUNTER — Inpatient Hospital Stay (HOSPITAL_COMMUNITY)
Admission: EM | Admit: 2015-12-01 | Discharge: 2015-12-02 | DRG: 291 | Disposition: A | Discharge status: Home / Self Care | Payer: Medicare Other | Attending: Student in an Organized Health Care Education/Training Program | Admitting: Student in an Organized Health Care Education/Training Program

## 2015-12-01 DIAGNOSIS — J449 Chronic obstructive pulmonary disease, unspecified: Secondary | ICD-10-CM | POA: Diagnosis present

## 2015-12-01 DIAGNOSIS — I4891 Unspecified atrial fibrillation: Secondary | ICD-10-CM | POA: Diagnosis not present

## 2015-12-01 DIAGNOSIS — I5033 Acute on chronic diastolic (congestive) heart failure: Secondary | ICD-10-CM | POA: Diagnosis not present

## 2015-12-01 DIAGNOSIS — R0609 Other forms of dyspnea: Secondary | ICD-10-CM

## 2015-12-01 DIAGNOSIS — M069 Rheumatoid arthritis, unspecified: Secondary | ICD-10-CM | POA: Diagnosis present

## 2015-12-01 DIAGNOSIS — E039 Hypothyroidism, unspecified: Secondary | ICD-10-CM | POA: Diagnosis present

## 2015-12-01 DIAGNOSIS — Z87891 Personal history of nicotine dependence: Secondary | ICD-10-CM

## 2015-12-01 DIAGNOSIS — Z808 Family history of malignant neoplasm of other organs or systems: Secondary | ICD-10-CM

## 2015-12-01 DIAGNOSIS — I130 Hypertensive heart and chronic kidney disease with heart failure and stage 1 through stage 4 chronic kidney disease, or unspecified chronic kidney disease: Secondary | ICD-10-CM | POA: Diagnosis present

## 2015-12-01 DIAGNOSIS — G629 Polyneuropathy, unspecified: Secondary | ICD-10-CM | POA: Diagnosis present

## 2015-12-01 DIAGNOSIS — I480 Paroxysmal atrial fibrillation: Secondary | ICD-10-CM | POA: Diagnosis present

## 2015-12-01 DIAGNOSIS — E785 Hyperlipidemia, unspecified: Secondary | ICD-10-CM | POA: Diagnosis present

## 2015-12-01 DIAGNOSIS — I509 Heart failure, unspecified: Secondary | ICD-10-CM | POA: Diagnosis not present

## 2015-12-01 DIAGNOSIS — Z89412 Acquired absence of left great toe: Secondary | ICD-10-CM

## 2015-12-01 DIAGNOSIS — K219 Gastro-esophageal reflux disease without esophagitis: Secondary | ICD-10-CM | POA: Diagnosis present

## 2015-12-01 DIAGNOSIS — E1122 Type 2 diabetes mellitus with diabetic chronic kidney disease: Secondary | ICD-10-CM | POA: Diagnosis present

## 2015-12-01 DIAGNOSIS — Z794 Long term (current) use of insulin: Secondary | ICD-10-CM

## 2015-12-01 DIAGNOSIS — D638 Anemia in other chronic diseases classified elsewhere: Secondary | ICD-10-CM | POA: Diagnosis present

## 2015-12-01 DIAGNOSIS — N184 Chronic kidney disease, stage 4 (severe): Secondary | ICD-10-CM | POA: Diagnosis present

## 2015-12-01 DIAGNOSIS — I5043 Acute on chronic combined systolic (congestive) and diastolic (congestive) heart failure: Secondary | ICD-10-CM | POA: Diagnosis present

## 2015-12-01 DIAGNOSIS — Z7952 Long term (current) use of systemic steroids: Secondary | ICD-10-CM

## 2015-12-01 DIAGNOSIS — Z7901 Long term (current) use of anticoagulants: Secondary | ICD-10-CM

## 2015-12-01 DIAGNOSIS — R0602 Shortness of breath: Secondary | ICD-10-CM | POA: Diagnosis not present

## 2015-12-01 DIAGNOSIS — Z8709 Personal history of other diseases of the respiratory system: Secondary | ICD-10-CM | POA: Diagnosis not present

## 2015-12-01 DIAGNOSIS — I110 Hypertensive heart disease with heart failure: Secondary | ICD-10-CM | POA: Diagnosis not present

## 2015-12-01 DIAGNOSIS — R000 Tachycardia, unspecified: Secondary | ICD-10-CM | POA: Diagnosis present

## 2015-12-01 DIAGNOSIS — J189 Pneumonia, unspecified organism: Secondary | ICD-10-CM | POA: Diagnosis not present

## 2015-12-01 LAB — ECHOCARDIOGRAM COMPLETE
FS: 20 % — AB (ref 28–44)
Height: 68 in
IV/PV OW: 0.88
LA diam end sys: 40 mm
LA diam index: 1.79 cm/m2
LA vol A4C: 68.9 ml
LA vol index: 34.2 mL/m2
LA vol: 76.4 mL
LASIZE: 40 mm
LDCA: 3.14 cm2
LVOTD: 20 mm
PW: 14.1 mm — AB (ref 0.6–1.1)
RV LATERAL S' VELOCITY: 16.1 cm/s
TAPSE: 21.9 mm
Weight: 3556.8 oz

## 2015-12-01 LAB — COMPREHENSIVE METABOLIC PANEL
ALK PHOS: 83 U/L (ref 38–126)
ALT: 16 U/L — ABNORMAL LOW (ref 17–63)
ANION GAP: 7 (ref 5–15)
AST: 22 U/L (ref 15–41)
Albumin: 3.3 g/dL — ABNORMAL LOW (ref 3.5–5.0)
BILIRUBIN TOTAL: 1 mg/dL (ref 0.3–1.2)
BUN: 15 mg/dL (ref 6–20)
CALCIUM: 8.6 mg/dL — AB (ref 8.9–10.3)
CO2: 24 mmol/L (ref 22–32)
Chloride: 108 mmol/L (ref 101–111)
Creatinine, Ser: 1.75 mg/dL — ABNORMAL HIGH (ref 0.61–1.24)
GFR, EST AFRICAN AMERICAN: 40 mL/min — AB (ref >60)
GFR, EST NON AFRICAN AMERICAN: 34 mL/min — AB (ref >60)
GLUCOSE: 198 mg/dL — AB (ref 65–99)
Potassium: 3.8 mmol/L (ref 3.5–5.1)
Sodium: 139 mmol/L (ref 135–145)
TOTAL PROTEIN: 5.6 g/dL — AB (ref 6.5–8.1)

## 2015-12-01 LAB — TROPONIN I
TROPONIN I: 0.1 ng/mL — AB (ref <0.03)
Troponin I: 0.11 ng/mL (ref <0.03)

## 2015-12-01 LAB — I-STAT TROPONIN, ED: TROPONIN I, POC: 0.09 ng/mL — AB (ref 0.00–0.08)

## 2015-12-01 LAB — GLUCOSE, CAPILLARY
GLUCOSE-CAPILLARY: 168 mg/dL — AB (ref 65–99)
Glucose-Capillary: 194 mg/dL — ABNORMAL HIGH (ref 65–99)
Glucose-Capillary: 274 mg/dL — ABNORMAL HIGH (ref 65–99)

## 2015-12-01 LAB — CBC WITH DIFFERENTIAL/PLATELET
Basophils Absolute: 0.1 10*3/uL (ref 0.0–0.1)
Basophils Relative: 1 %
Eosinophils Absolute: 0.3 10*3/uL (ref 0.0–0.7)
Eosinophils Relative: 3 %
HEMATOCRIT: 31.9 % — AB (ref 39.0–52.0)
HEMOGLOBIN: 10.5 g/dL — AB (ref 13.0–17.0)
LYMPHS ABS: 0.8 10*3/uL (ref 0.7–4.0)
Lymphocytes Relative: 10 %
MCH: 30.2 pg (ref 26.0–34.0)
MCHC: 32.9 g/dL (ref 30.0–36.0)
MCV: 91.7 fL (ref 78.0–100.0)
MONO ABS: 0.6 10*3/uL (ref 0.1–1.0)
MONOS PCT: 8 %
NEUTROS ABS: 6.2 10*3/uL (ref 1.7–7.7)
NEUTROS PCT: 78 %
Platelets: 189 10*3/uL (ref 150–400)
RBC: 3.48 MIL/uL — ABNORMAL LOW (ref 4.22–5.81)
RDW: 14.4 % (ref 11.5–15.5)
WBC: 7.9 10*3/uL (ref 4.0–10.5)

## 2015-12-01 LAB — D-DIMER, QUANTITATIVE (NOT AT ARMC): D DIMER QUANT: 0.92 ug{FEU}/mL — AB (ref 0.00–0.50)

## 2015-12-01 LAB — BRAIN NATRIURETIC PEPTIDE: B NATRIURETIC PEPTIDE 5: 429.8 pg/mL — AB (ref 0.0–100.0)

## 2015-12-01 LAB — PROCALCITONIN: Procalcitonin: <0.1 ng/mL

## 2015-12-01 LAB — STREP PNEUMONIAE URINARY ANTIGEN: Strep Pneumo Urinary Antigen: NEGATIVE

## 2015-12-01 LAB — PROTIME-INR
INR: 1.12
Prothrombin Time: 14.5 seconds (ref 11.4–15.2)

## 2015-12-01 LAB — INFLUENZA PANEL BY PCR (TYPE A & B)
INFLBPCR: NEGATIVE
Influenza A By PCR: NEGATIVE

## 2015-12-01 LAB — TSH: TSH: 2.103 u[IU]/mL (ref 0.350–4.500)

## 2015-12-01 MED ORDER — DEXTROSE 5 % IV SOLN
1.0000 g | Freq: Three times a day (TID) | INTRAVENOUS | Status: DC
  Administered 2015-12-01: 1 g via INTRAVENOUS
  Filled 2015-12-01 (×3): qty 1

## 2015-12-01 MED ORDER — TAMSULOSIN HCL 0.4 MG PO CAPS
0.4000 mg | ORAL_CAPSULE | Freq: Every day | ORAL | Status: DC
  Administered 2015-12-01: 0.4 mg via ORAL
  Filled 2015-12-01: qty 1

## 2015-12-01 MED ORDER — ALBUTEROL SULFATE (2.5 MG/3ML) 0.083% IN NEBU
5.0000 mg | INHALATION_SOLUTION | Freq: Once | RESPIRATORY_TRACT | Status: AC
  Administered 2015-12-01: 5 mg via RESPIRATORY_TRACT
  Filled 2015-12-01: qty 6

## 2015-12-01 MED ORDER — INSULIN GLARGINE 100 UNIT/ML ~~LOC~~ SOLN
10.0000 [IU] | Freq: Every day | SUBCUTANEOUS | Status: DC
  Administered 2015-12-01: 10 [IU] via SUBCUTANEOUS
  Filled 2015-12-01 (×2): qty 0.1

## 2015-12-01 MED ORDER — ALBUTEROL SULFATE (2.5 MG/3ML) 0.083% IN NEBU: 2.5000 mg | INHALATION_SOLUTION | Freq: Four times a day (QID) | RESPIRATORY_TRACT | Status: DC

## 2015-12-01 MED ORDER — ONDANSETRON HCL 4 MG PO TABS: 4.0000 mg | ORAL_TABLET | Freq: Four times a day (QID) | ORAL | Status: DC | PRN

## 2015-12-01 MED ORDER — INSULIN ASPART 100 UNIT/ML ~~LOC~~ SOLN
0.0000 [IU] | Freq: Three times a day (TID) | SUBCUTANEOUS | Status: DC
  Administered 2015-12-01: 5 [IU] via SUBCUTANEOUS
  Administered 2015-12-02: 2 [IU] via SUBCUTANEOUS

## 2015-12-01 MED ORDER — INSULIN ASPART 100 UNIT/ML ~~LOC~~ SOLN
0.0000 [IU] | SUBCUTANEOUS | Status: DC
  Administered 2015-12-01: 2 [IU] via SUBCUTANEOUS

## 2015-12-01 MED ORDER — FUROSEMIDE 10 MG/ML IJ SOLN
80.0000 mg | Freq: Two times a day (BID) | INTRAMUSCULAR | Status: DC
  Administered 2015-12-01 – 2015-12-02 (×2): 80 mg via INTRAVENOUS
  Filled 2015-12-01 (×2): qty 8

## 2015-12-01 MED ORDER — ONDANSETRON HCL 4 MG/2ML IJ SOLN: 4.0000 mg | Freq: Four times a day (QID) | INTRAMUSCULAR | Status: DC | PRN

## 2015-12-01 MED ORDER — SENNA 8.6 MG PO TABS
1.0000 | ORAL_TABLET | Freq: Two times a day (BID) | ORAL | Status: DC
  Administered 2015-12-01 – 2015-12-02 (×2): 8.6 mg via ORAL
  Filled 2015-12-01 (×2): qty 1

## 2015-12-01 MED ORDER — FUROSEMIDE 10 MG/ML IJ SOLN: 40.0000 mg | Freq: Two times a day (BID) | INTRAMUSCULAR | Status: DC

## 2015-12-01 MED ORDER — FLUTICASONE FUROATE-VILANTEROL 100-25 MCG/INH IN AEPB
1.0000 | INHALATION_SPRAY | Freq: Every day | RESPIRATORY_TRACT | Status: DC
  Administered 2015-12-02: 1 via RESPIRATORY_TRACT
  Filled 2015-12-01: qty 28

## 2015-12-01 MED ORDER — ACETAMINOPHEN 650 MG RE SUPP: 650.0000 mg | Freq: Four times a day (QID) | RECTAL | Status: DC | PRN

## 2015-12-01 MED ORDER — DEXTROSE 5 % IV SOLN: 2.0000 g | Freq: Once | INTRAVENOUS | Status: DC

## 2015-12-01 MED ORDER — APIXABAN 2.5 MG PO TABS
2.5000 mg | ORAL_TABLET | Freq: Two times a day (BID) | ORAL | Status: DC
  Administered 2015-12-01 – 2015-12-02 (×2): 2.5 mg via ORAL
  Filled 2015-12-01 (×2): qty 1

## 2015-12-01 MED ORDER — VANCOMYCIN HCL 10 G IV SOLR: 1250.0000 mg | INTRAVENOUS | Status: DC

## 2015-12-01 MED ORDER — ATORVASTATIN CALCIUM 20 MG PO TABS: 20.0000 mg | ORAL_TABLET | Freq: Every day | ORAL | Status: DC

## 2015-12-01 MED ORDER — LEVOTHYROXINE SODIUM 25 MCG PO TABS
25.0000 ug | ORAL_TABLET | Freq: Every day | ORAL | Status: DC
  Administered 2015-12-02: 25 ug via ORAL
  Filled 2015-12-01: qty 1

## 2015-12-01 MED ORDER — SODIUM CHLORIDE 0.9 % IV SOLN
2000.0000 mg | Freq: Once | INTRAVENOUS | Status: DC
  Filled 2015-12-01: qty 2000

## 2015-12-01 MED ORDER — FINASTERIDE 5 MG PO TABS
5.0000 mg | ORAL_TABLET | Freq: Every day | ORAL | Status: DC
  Administered 2015-12-01: 5 mg via ORAL
  Filled 2015-12-01: qty 1

## 2015-12-01 MED ORDER — FUROSEMIDE 10 MG/ML IJ SOLN
40.0000 mg | Freq: Once | INTRAMUSCULAR | Status: AC
  Administered 2015-12-01: 40 mg via INTRAVENOUS
  Filled 2015-12-01: qty 4

## 2015-12-01 MED ORDER — ALBUTEROL SULFATE (2.5 MG/3ML) 0.083% IN NEBU: 2.5000 mg | INHALATION_SOLUTION | Freq: Four times a day (QID) | RESPIRATORY_TRACT | Status: DC | PRN

## 2015-12-01 MED ORDER — ALBUTEROL SULFATE (2.5 MG/3ML) 0.083% IN NEBU: 2.5000 mg | INHALATION_SOLUTION | RESPIRATORY_TRACT | Status: DC | PRN

## 2015-12-01 MED ORDER — TRAZODONE HCL 50 MG PO TABS
50.0000 mg | ORAL_TABLET | Freq: Every day | ORAL | Status: DC
  Administered 2015-12-01: 50 mg via ORAL
  Filled 2015-12-01: qty 1

## 2015-12-01 MED ORDER — ISOSORB DINITRATE-HYDRALAZINE 20-37.5 MG PO TABS
1.0000 | ORAL_TABLET | Freq: Three times a day (TID) | ORAL | Status: DC
  Administered 2015-12-01 – 2015-12-02 (×3): 1 via ORAL
  Filled 2015-12-01 (×3): qty 1

## 2015-12-01 MED ORDER — AZITHROMYCIN 500 MG PO TABS
500.0000 mg | ORAL_TABLET | Freq: Every day | ORAL | Status: DC
  Administered 2015-12-02: 500 mg via ORAL
  Filled 2015-12-01: qty 1

## 2015-12-01 MED ORDER — DEXTROSE 5 % IV SOLN
1.0000 g | Freq: Once | INTRAVENOUS | Status: DC
  Administered 2015-12-01: 1 g via INTRAVENOUS
  Filled 2015-12-01: qty 1

## 2015-12-01 MED ORDER — GABAPENTIN 300 MG PO CAPS
300.0000 mg | ORAL_CAPSULE | Freq: Two times a day (BID) | ORAL | Status: DC
  Administered 2015-12-01 – 2015-12-02 (×2): 300 mg via ORAL
  Filled 2015-12-01 (×2): qty 1

## 2015-12-01 MED ORDER — DEXTROSE 5 % IV SOLN: 2.0000 g | INTRAVENOUS | Status: DC

## 2015-12-01 MED ORDER — PREDNISONE 20 MG PO TABS
60.0000 mg | ORAL_TABLET | Freq: Once | ORAL | Status: AC
  Administered 2015-12-01: 60 mg via ORAL
  Filled 2015-12-01: qty 3

## 2015-12-01 MED ORDER — METOPROLOL TARTRATE 25 MG PO TABS
25.0000 mg | ORAL_TABLET | Freq: Three times a day (TID) | ORAL | Status: DC
  Administered 2015-12-01 – 2015-12-02 (×3): 25 mg via ORAL
  Filled 2015-12-01 (×3): qty 1

## 2015-12-01 MED ORDER — ACETAMINOPHEN 325 MG PO TABS
650.0000 mg | ORAL_TABLET | Freq: Four times a day (QID) | ORAL | Status: DC | PRN
Start: 2015-12-01 — End: 2015-12-02

## 2015-12-01 MED ORDER — LORATADINE 10 MG PO TABS
10.0000 mg | ORAL_TABLET | Freq: Every day | ORAL | Status: DC
  Administered 2015-12-02: 10 mg via ORAL
  Filled 2015-12-01: qty 1

## 2015-12-01 MED ORDER — PREDNISONE 20 MG PO TABS: 40.0000 mg | ORAL_TABLET | Freq: Every day | ORAL | Status: DC

## 2015-12-01 NOTE — Progress Notes (Signed)
Patient educated about safety and bed alarm during the night but he refused bed alarm. Will continue to monitor.   Sanjay Broadfoot, RN

## 2015-12-01 NOTE — ED Notes (Signed)
Patient taken to Xray  

## 2015-12-01 NOTE — ED Provider Notes (Signed)
[facility] DEPT Provider Note   CSN: [id number] Arrival date & time: 12/01/15  0711     History   Chief Complaint Chief Complaint  Patient presents with  . Shortness of Breath    HPI [name] is a 82 y.o. male.  HPI   82 year old male presents today complaining of increasing dyspnea and cough over one week. He has a history of anemia, atrial fibrillation, congestive heart failure, and had a toe amputation at the end of last month. He describes the cough and dyspnea as gradually increasing. He may have had some chills but has not definitively had a fever. He has not noted whether he has had any increase in swelling or fluid. He states that he has had lung disease since a pneumonia he had incurred. In the 1950s. He states he uses a best for physical therapy for his lungs. He does not endorse using any inhalers or other breathing medications. He reports taking the rest of his medications as prescribed including eliquis. He has not noted any bleeding. He denies any headache or head injury, neck pain, or chest pain. He is increasingly dyspneic with any exertion and is unable at this point to get out of bed without being extremely short of breath.  Past Medical History:  Diagnosis Date  . Anemia   . Atrial fibrillation ([place]) 12/08/2013  . Cataract   . CHF (congestive heart failure) ([place])   . Chronic kidney disease   . Constipation    takes stool softener daily  . Dysrhythmia    a-fib  . GERD (gastroesophageal reflux disease)    takes Zantac daily   . History of blood transfusion    "related to one of my ORs"  . Hyperlipidemia    takes Pravastatin daily  . Hypertension    takes Cardizem,Proscar,and Lisinopril daily  . Hypothyroidism   . Impaired hearing    wears hearing aids  . Joint pain   . Joint swelling   . Peripheral neuropathy ([place])   . Rheumatoid arthritis ([place])    "all over; take orencia  q 4 wks" (02/01/2013)  . Type II diabetes mellitus (HCC)    takes  Metformin daily    Patient Active Problem List   Diagnosis Date Noted  . Dyspnea 09/12/2015  . Sepsis due to undetermined organism ([place])   . Chronic systolic CHF (congestive heart failure) ([place])   . Chronic kidney disease (CKD), stage IV (severe) ([place])   . Cellulitis of left lower extremity   . Pulmonary hypertension   . Paroxysmal atrial fibrillation (HCC)   . Other emphysema ([place])   . Bronchiectasis without complication ([place])   . Other specified hypothyroidism   . Fever 08/18/2014  . Sepsis ([place]) 08/18/2014  . CKD (chronic kidney disease) stage 4, GFR 15-29 ml/min (HCC) 08/18/2014  . Acute on chronic systolic heart failure ([place]) 08/18/2014  . Bronchiectasis ([place]) 08/18/2014  . Hypothyroidism 08/18/2014  . Bronchiectasis without acute exacerbation ([place]) 12/29/2013  . LVH (left ventricular hypertrophy) due to hypertensive disease 12/10/2013  . Atrial fibrillation ([place]) 12/08/2013  . CHF (congestive heart failure) ([place]) 12/07/2013  . Chronic obstructive airway disease with asthma ([place]) 12/07/2013  . Thyroid goiter 02/01/2013  . Low TSH level 05/18/2012  . Right thyroid nodule 05/18/2012  . Diabetes ([place]) 05/18/2012  . HLD (hyperlipidemia) 05/18/2012  . Rheumatoid arthritis ([place]) 05/18/2012  . Essential hypertension 05/18/2012  . GERD (gastroesophageal reflux disease) 05/18/2012  . Anemia 05/18/2012    Past Surgical History:  Procedure Laterality Date  . AMPUTATION TOE Left 11/08/2015   Procedure: LEFT HALLUX AMPUTATION AT THE METATARSOPHALANGEAL JOINT;  Surgeon: Wylene Simmer, MD;  Location: [facility];  Service: Orthopedics;  Laterality: Left;  . ANKLE FUSION Left 2014  . BIOPSY THYROID  2014  . CARPAL TUNNEL RELEASE Bilateral 1990's  . CATARACT EXTRACTION W/ INTRAOCULAR LENS  IMPLANT, BILATERAL Bilateral 1990's  . COLONOSCOPY    . FOREIGN BODY REMOVAL Right 09/06/2013   Procedure: REMOVAL FOREIGN BODY RIGHT INDEX FINGER;  Surgeon: Daryll Brod, MD;  Location:  [facility];  Service: Orthopedics;  Laterality: Right;  ANESTHESIA: IV REGIONAL FAB  . REPAIR TENDONS FOOT Left 1946; 1984   "farming accident; repaired tendons both times"  . SHOULDER ARTHROSCOPY W/ ROTATOR CUFF REPAIR Right 1990's  . THYROIDECTOMY Right 02/01/2013   Procedure: RIGHT THYROIDECTOMY LOBECTOMY;  Surgeon: Earnstine Regal, MD;  Location: [facility];  Service: General;  Laterality: Right;  . THYROIDECTOMY, PARTIAL Right 02/01/2013  . TONSILLECTOMY  1930's  . TOTAL KNEE ARTHROPLASTY Right 12/16/2010   Procedure: TOTAL KNEE ARTHROPLASTY; rt Surgeon: Rudean Haskell, MD;  Location: [facility];  Service: Orthopedics;  Laterality: Right;  . TOTAL SHOULDER REPLACEMENT Left 03/2010       Home Medications    Prior to Admission medications   Medication Sig Start Date End Date Taking? Authorizing Provider  Abatacept (ORENCIA IV) Inject into the vein every 30 (thirty) days.    Historical Provider, MD  albuterol (PROVENTIL) (2.5 MG/3ML) 0.083% nebulizer solution Take 3 mLs (2.5 mg total) by nebulization every 6 (six) hours as needed for wheezing or shortness of breath. 11/06/15   Juanito Doom, MD  atorvastatin (LIPITOR) 20 MG tablet Take 1 tablet (20 mg total) by mouth daily at 6 PM. Patient taking differently: Take 40 mg by mouth daily at 6 PM.  08/22/14   Allie Bossier, MD  cetirizine (ZYRTEC) 10 MG tablet Take 10 mg by mouth daily.    Historical Provider, MD  ELIQUIS 2.5 MG TABS tablet Take 2.5 mg by mouth 2 (two) times daily. 01/22/14   Historical Provider, MD  ferrous sulfate 325 (65 FE) MG tablet Take 325 mg by mouth daily with breakfast.     Historical Provider, MD  finasteride (PROSCAR) 5 MG tablet Take 5 mg by mouth at bedtime.     Historical Provider, MD  fluticasone furoate-vilanterol (BREO ELLIPTA) 100-25 MCG/INH AEPB Inhale 1 puff into the lungs daily. 11/06/15   Juanito Doom, MD  folic acid (FOLVITE) 1 MG tablet Take 1 mg by mouth daily.      Historical Provider, MD    furosemide (LASIX) 80 MG tablet Take 80 mg by mouth 2 (two) times daily.    Historical Provider, MD  gabapentin (NEURONTIN) 300 MG capsule Take 1 capsule (300 mg total) by mouth 2 (two) times daily. 12/13/13   Milagros Loll, MD  HYDROcodone-acetaminophen (NORCO) 5-325 MG per tablet Take 1 tablet by mouth every 6 (six) hours as needed for moderate pain. 09/06/13   Daryll Brod, MD  Insulin Glargine (TOUJEO SOLOSTAR) 300 UNIT/ML SOPN Inject 10 Units into the skin at bedtime.     Historical Provider, MD  isosorbide-hydrALAZINE (BIDIL) 20-37.5 MG tablet Take 1 tablet by mouth 3 (three) times daily.    Historical Provider, MD  levothyroxine (SYNTHROID, LEVOTHROID) 25 MCG tablet Take 25 mcg by mouth daily before breakfast.    Historical Provider, MD  metoprolol (LOPRESSOR) 50 MG tablet Take  25 mg by mouth 3 (three) times daily.    Historical Provider, MD  ranitidine (ZANTAC) 150 MG tablet Take 150 mg by mouth 2 (two) times daily.      Historical Provider, MD  scopolamine (TRANSDERM-SCOP) 1 MG/3DAYS Place 1 patch (1.5 mg total) onto the skin once as needed (none in pt > 65. Consult anesthesiologist for pts with psychiatric disorders). 11/08/15   Wylene Simmer, MD  Tamsulosin HCl (FLOMAX) 0.4 MG CAPS Take 0.4 mg by mouth daily after supper.     Historical Provider, MD    Family History Family History  Problem Relation Age of Onset  . Cancer Mother     breast  . Aneurysm Father     Social History Social History  Substance Use Topics  . Smoking status: Former Smoker    Packs/day: 1.00    Years: 15.00    Types: Cigarettes    Quit date: 09/01/1965  . Smokeless tobacco: Never Used     Comment: quit smoking in 1967  . Alcohol use No     Comment: 02/01/2013 "stopped drinking in 1967; about an alcoholic when I quit"     Allergies   Patient has no known allergies.   Review of Systems Review of Systems  All other systems reviewed and are negative.    Physical Exam Updated Vital Signs BP  145/84 (BP Location: Right Arm)   Pulse 106   Temp 97.8 F (36.6 C) (Rectal)   Resp 22   Ht 5\' 8"  (1.727 m)   Wt 98 kg   SpO2 93%   BMI 32.84 kg/m   Physical Exam  Constitutional: He is oriented to person, place, and time. He appears well-developed and well-nourished. No distress.  HENT:  Head: Normocephalic and atraumatic.  Right Ear: External ear normal.  Left Ear: External ear normal.  Mouth/Throat: Oropharynx is clear and moist.  Eyes: Conjunctivae and EOM are normal. Pupils are equal, round, and reactive to light.  Neck: Normal range of motion.  Cardiovascular: An irregular rhythm present. Tachycardia present.   Pulmonary/Chest: Effort normal. He has wheezes.  Abdominal: Soft. Bowel sounds are normal.  Musculoskeletal: Normal range of motion.  Dressing in place left great toe  Neurological: He is alert and oriented to person, place, and time.  Skin: Skin is warm and dry. Capillary refill takes less than 2 seconds.  Psychiatric: He has a normal mood and affect.  Nursing note and vitals reviewed.    ED Treatments / Results  Labs (all labs ordered are listed, but only abnormal results are displayed) Labs Reviewed  I-STAT TROPOININ, ED - Abnormal; Notable for the following:       Result Value   Troponin i, poc 0.09 (*)    All other components within normal limits  CBC WITH DIFFERENTIAL/PLATELET  COMPREHENSIVE METABOLIC PANEL  D-DIMER, QUANTITATIVE (NOT AT [facility])  [place]    EKG  EKG Interpretation  Date/Time:  Saturday December 01 2015 07:20:28 EST Ventricular Rate:  110 PR Interval:    QRS Duration: 106 QT Interval:  353 QTC Calculation: 478 R Axis:   124 Text Interpretation:  Sinus tachycardia Right axis deviation Probable anteroseptal infarct, old Borderline repolarization abnormality Baseline wander in lead(s) V5 Confirmed by Elnora Quizon MD, Kalany Diekmann ([id number]) on 12/01/2015 8:02:06 AM       Radiology Dg Chest 2 View  Result  Date: 12/01/2015 CLINICAL DATA:  Shortness of breath for 1 week EXAM: CHEST  2 VIEW COMPARISON:  08/18/2014 FINDINGS:  There is mild cardiomegaly with vascular congestion. Bibasilar opacities noted, atelectasis versus infiltrates, left greater than right. Small left pleural effusion. No acute bony abnormality. IMPRESSION: Cardiomegaly with vascular congestion. Small left pleural effusion. Bibasilar atelectasis or infiltrates, left greater than right. Electronically Signed   By: Rolm Baptise M.D.   On: 12/01/2015 08:07    Procedures Procedures (including critical care time)  Medications Ordered in ED Medications  albuterol (PROVENTIL) (2.5 MG/3ML) 0.083% nebulizer solution 5 mg (not administered)  predniSONE (DELTASONE) tablet 60 mg (not administered)     Initial Impression / Assessment and Plan / ED Course  I have reviewed the triage vital signs and the nursing notes.  Pertinent labs & imaging results that were available during my care of the patient were reviewed by me and considered in my medical decision making (see chart for details).  Clinical Course     Patient given albuterol 5 mg and prednisone as he is wheezing. Remainder of workup pending. 9:43 AM Discussed results with patient. He agrees with plan to admit. He is comfortable sitting on bed with oxygen on but becomes very dyspneic with any exertion including sitting up and moving around in bed.  1- dyspnea- ? hcap patient with bilateral effusion /infiltrates- treating as per hcap guidelines with maxipim and vanc.  2- possible volume overload- patient with chf and some cardiomegaly/vascular congestion- this could be causing symptoms.  No peripheral edema, patient with slighlty elevated bnp.  Plan small dose lasix and monitor ins and outs/ 3- d-dimer elevated- patient on eliquis- d-dimer mildly elevated but could represent renal insufficiency and age related increase.  4- anemia- hgb  10.5, no obvious source of blood loss,  patient with some trend down but recent surgery 5- renal insufficiency- previous creatinine 2.2 now 1.75- could represent volume overload 6- diabetes- bs 198, no evidence of dka 7-hypertension- mildly elevate bp here at 145/84  8- tachycardia   Discussed with DR. Tiburcio Pea and will place in tele bed.  Final Clinical Impressions(s) / ED Diagnoses   Final diagnoses:  Acute on chronic congestive heart failure, unspecified congestive heart failure type ([place])  HCAP (healthcare-associated pneumonia)  Dyspnea on exertion    New Prescriptions New Prescriptions   No medications on file     Pattricia Boss, MD 12/01/15 [phone]

## 2015-12-01 NOTE — Progress Notes (Signed)
MD paged about critical troponin 0.10 called by Zelda in lab. No new orders at this time. RN will continue to monitor

## 2015-12-01 NOTE — Progress Notes (Signed)
  Echocardiogram 2D Echocardiogram has been performed.  [name] 12/01/2015, 3:37 PM

## 2015-12-01 NOTE — ED Notes (Signed)
Attempted report 

## 2015-12-01 NOTE — Progress Notes (Signed)
Pharmacy Antibiotic Note  [name] is a 82 y.o. male admitted on 12/01/2015 with pneumonia.  Pharmacy has been consulted for vancomycin and cefepime dosing. Wt 98 kg, creat 1.75, past creat 2.01 - 2.22.  AF. CXR: bibasilar atelectasis for infiltrates L>R. Cefepime 1 gm given in ED at 1011 am.   Plan: vancomycin 2 gm loading dose followed by 1250 mg IV q24 - creatinine borderline for 1 gm q24 regimen;  for vanc trough goal 15-20 mcg/ml Cefepime 1 gm 2nd dose to make a 2 gm dose today then change 1 gm q8h to 2 gm q24 F/u renal fxn, wbc, temp, culture data vanc level as needed  Height: 5\' 8"  (172.7 cm) Weight: 216 lb (98 kg) IBW/kg (Calculated) : 68.4  Temp (24hrs), Avg:97.8 F (36.6 C), Min:97.7 F (36.5 C), Max:97.8 F (36.6 C)   Recent Labs Lab 12/01/15 0756  WBC 7.9  CREATININE 1.75*    Estimated Creatinine Clearance: 36.3 mL/min (by C-G formula based on SCr of 1.75 mg/dL (H)).    No Known Allergies  Thank you for allowing pharmacy to be a part of this patient's care.  Eudelia Bunch, Pharm.D. [id number] 12/01/2015 10:55 AM

## 2015-12-01 NOTE — ED Triage Notes (Signed)
Pt states increasing sob x 1 week.  States unable to take deep breath and lay flat.  Sats of 93% on RA with labored breathing.  Denies pain.  Amputation to toe of L foot in Oct.  Denies chest pain.  Placed on 2L [place] upon arrival.

## 2015-12-01 NOTE — H&P (Signed)
Date: 12/01/2015               Patient Name:  [name] MRN: [id number]  DOB: 1930-09-29 Age / Sex: 82 y.o., male   PCP: [name], MD         Medical Service: Internal Medicine Teaching Service         Attending Physician: Dr. Axel Filler, MD    First Contact: Dr. Jari Favre Pager: [phone]  Second Contact: Dr. Tiburcio Pea Pager: [phone]       After Hours (After 5p/  First Contact Pager: [phone]  weekends / holidays): Second Contact Pager: [phone]   Chief Complaint: dyspnea for one week  History of Present Illness: [name] is a 82yo male with PMH of anemia, A fib on chronic anticoagulation, chronic systolic and diastolic congestive heart failure, insulin-dependent DM, HTN, CKD stage 4, COPD with asthma, rheumatoid arthritis presenting to the [facility] with progressive dyspnea for one week that is exacerbated by exertion and lying down. When asked directly he says his watch is fitting tighter on his arm today and his abdomen is more distended than usual. He endorses chronic allergies with rhinorrhea and some congestion that mainly occurs in the morning. He denies change in cough, denies sputum production except first thing in the morning, denies chest pain, hemoptysis, headaches, fevers, chills, and sick contacts. He endorses medication compliance, including lasix and has taken all of his medicines this morning before coming to the ED. He reports his goal weight is at 216lb, weighs himself regularly and has not noticed change.    Meds:  Current Meds  Medication Sig  . Abatacept (ORENCIA IV) Inject into the vein every 30 (thirty) days.  [name] albuterol (PROVENTIL) (2.5 MG/3ML) 0.083% nebulizer solution Take 3 mLs (2.5 mg total) by nebulization every 6 (six) hours as needed for wheezing or shortness of breath.  [name] atorvastatin (LIPITOR) 20 MG tablet Take 1 tablet (20 mg total) by mouth daily at 6 PM. (Patient taking differently: Take 40 mg by mouth daily at 6 PM. )  . cetirizine  (ZYRTEC) 10 MG tablet Take 10 mg by mouth daily.  [name] ELIQUIS 2.5 MG TABS tablet Take 2.5 mg by mouth 2 (two) times daily.  . ferrous sulfate 325 (65 FE) MG tablet Take 325 mg by mouth daily with breakfast.   . finasteride (PROSCAR) 5 MG tablet Take 5 mg by mouth at bedtime.   . fluticasone furoate-vilanterol (BREO ELLIPTA) 100-25 MCG/INH AEPB Inhale 1 puff into the lungs daily.  . folic acid (FOLVITE) 1 MG tablet Take 1 mg by mouth daily.    . furosemide (LASIX) 80 MG tablet Take 80 mg by mouth 2 (two) times daily.  [name] gabapentin (NEURONTIN) 300 MG capsule Take 1 capsule (300 mg total) by mouth 2 (two) times daily.  [name] HYDROcodone-acetaminophen (NORCO) 5-325 MG per tablet Take 1 tablet by mouth every 6 (six) hours as needed for moderate pain.  . Insulin Glargine (TOUJEO SOLOSTAR) 300 UNIT/ML SOPN Inject 10 Units into the skin at bedtime.   . isosorbide-hydrALAZINE (BIDIL) 20-37.5 MG tablet Take 1 tablet by mouth 3 (three) times daily.  [name] levothyroxine (SYNTHROID, LEVOTHROID) 25 MCG tablet Take 25 mcg by mouth daily before breakfast.  . metoprolol (LOPRESSOR) 50 MG tablet Take 25 mg by mouth 3 (three) times daily.  . ranitidine (ZANTAC) 150 MG tablet Take 150 mg by mouth 2 (two) times daily.    [name] scopolamine (TRANSDERM-SCOP) 1 MG/3DAYS Place 1 patch (  1.5 mg total) onto the skin once as needed (none in pt > 65. Consult anesthesiologist for pts with psychiatric disorders).  . Tamsulosin HCl (FLOMAX) 0.4 MG CAPS Take 0.4 mg by mouth daily after supper.      Allergies: Allergies as of 12/01/2015  . (No Known Allergies)   Past Medical History:  Diagnosis Date  . Anemia   . Atrial fibrillation ([place]) 12/08/2013  . Cataract   . CHF (congestive heart failure) ([place])   . Chronic kidney disease   . Constipation    takes stool softener daily  . Dysrhythmia    a-fib  . GERD (gastroesophageal reflux disease)    takes Zantac daily   . History of blood transfusion    "related to one of my ORs"  .  Hyperlipidemia    takes Pravastatin daily  . Hypertension    takes Cardizem,Proscar,and Lisinopril daily  . Hypothyroidism   . Impaired hearing    wears hearing aids  . Joint pain   . Joint swelling   . Peripheral neuropathy ([place])   . Rheumatoid arthritis ([place])    "all over; take orencia  q 4 wks" (02/01/2013)  . Type II diabetes mellitus (HCC)    takes Metformin daily    Family History: Mother died at age 58 with throat cancer; father died at age 32 of cerebral hemorrage.  Social History: Lives in [place] with his wife of 68 years. He is a former smoker; quin in [place]. Formerly drank alcohol while in [employer] but quit in 1962. He denies current or past illicit drug use.   Review of Systems: A complete ROS was negative except as per HPI.   Physical Exam: Blood pressure 171/98, pulse 119, temperature 97.8 F (36.6 C), temperature source Rectal, resp. rate (!) 32, height 5\' 8"  (1.727 m), weight 98 kg (216 lb), SpO2 95 %. Constitutional: Sitting up in bed, NAD, able to converse in complete sentences Eyes: no scleral icterus, noninjected ENT: moist mucosa, no erythema or exudates appreciated CV: tachycardic, regular rhythm, no murmurs, rubs or gallops appreciated, +JVD, 1+ LE edema L>R (at ankle), pulses intact throughout Resp: Bibasilar crackles L>R, mild intermittent wheezing, no increased work of breathing Abd: distended, soft, +BS, nontender, no organomegaly appreciated MSK: Left great toe amputation in dressing, left ankle edematous more than right (chronic) with skin changes, nontender LE, no change in temp Neuro: CN 2-12 grossly intact, strength intact Psych: Alert and oriented x3  Labs:  WBC 7.9, Hgb 10.5, Hct 31.9, Plts 189 Na 139, K 3.8, Cl 108, CO2 24, BUN 15, Cr 1.75, Glu 198. Alb 3.3, Ca 8.6 iStat 0.09 BNP 430 Ddimer 0.92  EKG: I personally reviewed the EKG: Sinus tachycardia, Twave inversion in V6, otherwise unchanged from previous  CXR: I personally reviewed the  CXR and agree with report: Cardiomegatly with vascular congestion. Small left pleural effusion. Basilar atelectasis vs infiltrates L>R.   Assessment & Plan by Problem: Active Problems:   Dyspnea  Acute on chronic combined CHF exacerbation: Patient with history of systolic and diastolic CHF presenting with one week of progressive dyspnea on exertion, orthopnea and swelling. Last ECHO 8/16 shows EF 50-55% with Grade 2 diastolic dysfunction. Reported dry weight 216, while weight on admission is 222lbs. Patient was mildly hypertensive, tachycardic and tachypneic on arrival. Well's score of 3 with tachycardia and recent surgery (toe amputation), however his clinical picture fits a CHF exacerbation more than PE. We will continue monitoring his O2 requirement and response to lasix,  and will pursue further studies if PE remains high on our differential.  --IV lasix 80mg  BID --strict ins and outs; daily weights --trend troponins --continue Bidil 20-37.5mg  TID --f/u AM Bmet  Bronchiectasis:  Patient with diagnosis of bronchiectasis, followed by Dr. Lake Bells. Last PFT's 10/17 were unremarkable and did not fit obstructive picture. High res chest CT in Sept consistent with bronchiectasis, bronchomalacia.  Patient does have Rheumatoid Arthritis, which is associated with bronchiectasis. Patient with questionable mild bibasilar infiltrates on CXR.  --prednisone 40mg  daily --azithromycin x3 days --albuterol neb q6 hrs, Breo daily  --pulm vest  PAF: Patient with PAF on Eliquis.  --continue Eliquis 2.5mg  BID --continue metoprolol tartrate 25mg  TID  DM: Patient with history of DM on lantus 10U qhs. Last A1c 6.9 08/19/2014.  --Lantus 10U qhs --SSI - S TID wc --f/u HbA1c  CKD, stage 4: B/l Cr ~2, on admission was 1.75. GFR also improved to 34 from high 20's previously. Followed by [employer].  --monitor with Bmet  Anemia: Patient with chronic anemia, likely 2/2 chronic disease. Hgb 10.5  on admission (b/l 11-12). No evidence of bleeding and patient denies hemoptysis, hematuria, and hematochezia. He is s/p L great toe amputation. --f/u am CBC  HTN: Patient with HTN managed with metoprolol 25mg  TID and Bidil 20-37.5mg  TID --continue home regimen  Hypothyroidism: Patient s/p right thyroidectomy for benign follicular adenoma. Currently on Synthroid 7mcg daily. --continue synthroid 30mcg daily  IVF: none Diet: HH/carb modified DVT ppx: Eliquis Code: Discussed and patient's wishes consistent with FULL Code; HPOA is daughter Cheri Kearns.   Dispo: Admit patient to Observation with expected length of stay less than 2 midnights.  Signed: Alphonzo Grieve, MD 12/01/2015, 11:56 AM  Pager [phone]

## 2015-12-02 ENCOUNTER — Other Ambulatory Visit: Payer: Self-pay

## 2015-12-02 DIAGNOSIS — I5033 Acute on chronic diastolic (congestive) heart failure: Secondary | ICD-10-CM

## 2015-12-02 DIAGNOSIS — Z8709 Personal history of other diseases of the respiratory system: Secondary | ICD-10-CM | POA: Diagnosis not present

## 2015-12-02 LAB — COMPREHENSIVE METABOLIC PANEL
ALK PHOS: 86 U/L (ref 38–126)
ALT: 16 U/L — AB (ref 17–63)
AST: 21 U/L (ref 15–41)
Albumin: 3.2 g/dL — ABNORMAL LOW (ref 3.5–5.0)
Anion gap: 9 (ref 5–15)
BUN: 23 mg/dL — ABNORMAL HIGH (ref 6–20)
CALCIUM: 8.5 mg/dL — AB (ref 8.9–10.3)
CO2: 28 mmol/L (ref 22–32)
CREATININE: 2.07 mg/dL — AB (ref 0.61–1.24)
Chloride: 103 mmol/L (ref 101–111)
GFR, EST AFRICAN AMERICAN: 32 mL/min — AB (ref >60)
GFR, EST NON AFRICAN AMERICAN: 28 mL/min — AB (ref >60)
Glucose, Bld: 167 mg/dL — ABNORMAL HIGH (ref 65–99)
Potassium: 3.5 mmol/L (ref 3.5–5.1)
Sodium: 140 mmol/L (ref 135–145)
TOTAL PROTEIN: 5.8 g/dL — AB (ref 6.5–8.1)
Total Bilirubin: 0.5 mg/dL (ref 0.3–1.2)

## 2015-12-02 LAB — CBC
HCT: 31 % — ABNORMAL LOW (ref 39.0–52.0)
HEMOGLOBIN: 10.4 g/dL — AB (ref 13.0–17.0)
MCH: 30.5 pg (ref 26.0–34.0)
MCHC: 33.5 g/dL (ref 30.0–36.0)
MCV: 90.9 fL (ref 78.0–100.0)
Platelets: 197 10*3/uL (ref 150–400)
RBC: 3.41 MIL/uL — AB (ref 4.22–5.81)
RDW: 14.2 % (ref 11.5–15.5)
WBC: 7.2 10*3/uL (ref 4.0–10.5)

## 2015-12-02 LAB — HEMOGLOBIN A1C
HEMOGLOBIN A1C: 7.5 % — AB (ref 4.8–5.6)
MEAN PLASMA GLUCOSE: 169 mg/dL

## 2015-12-02 LAB — GLUCOSE, CAPILLARY
GLUCOSE-CAPILLARY: 151 mg/dL — AB (ref 65–99)
Glucose-Capillary: 99 mg/dL (ref 65–99)

## 2015-12-02 LAB — TROPONIN I
TROPONIN I: 0.13 ng/mL — AB (ref <0.03)
TROPONIN I: 0.15 ng/mL — AB (ref <0.03)

## 2015-12-02 MED ORDER — AZITHROMYCIN 500 MG PO TABS: 500.0000 mg | ORAL_TABLET | Freq: Every day | ORAL | 0 refills | Status: DC

## 2015-12-02 NOTE — Discharge Summary (Signed)
Name: [name] MRN: [id number] DOB: [birth year] 82 y.o. PCP: William Brome, MD  Date of Admission: 12/01/2015  7:12 AM Date of Discharge: 12/02/2015 Attending Physician: Lalla Brothers  Discharge Diagnosis: 1. Acute on chronic diastolic heart failure Active Problems:   Dyspnea   Discharge Medications:   Medication List    TAKE these medications   albuterol (2.5 MG/3ML) 0.083% nebulizer solution Commonly known as:  PROVENTIL Take 3 mLs (2.5 mg total) by nebulization every 6 (six) hours as needed for wheezing or shortness of breath.   atorvastatin 20 MG tablet Commonly known as:  LIPITOR Take 1 tablet (20 mg total) by mouth daily at 6 PM. What changed:  how much to take   azithromycin 500 MG tablet Commonly known as:  ZITHROMAX Take 1 tablet (500 mg total) by mouth daily. For 4 more days Start taking on:  12/03/2015   cetirizine 10 MG tablet Commonly known as:  ZYRTEC Take 10 mg by mouth daily.   ELIQUIS 2.5 MG Tabs tablet Generic drug:  apixaban Take 2.5 mg by mouth 2 (two) times daily.   ferrous sulfate 325 (65 FE) MG tablet Take 325 mg by mouth daily with breakfast.   finasteride 5 MG tablet Commonly known as:  PROSCAR Take 5 mg by mouth at bedtime.   fluticasone furoate-vilanterol 100-25 MCG/INH Aepb Commonly known as:  BREO ELLIPTA Inhale 1 puff into the lungs daily.   folic acid 1 MG tablet Commonly known as:  FOLVITE Take 1 mg by mouth daily.   furosemide 80 MG tablet Commonly known as:  LASIX Take 80 mg by mouth 2 (two) times daily.   gabapentin 300 MG capsule Commonly known as:  NEURONTIN Take 1 capsule (300 mg total) by mouth 2 (two) times daily.   HYDROcodone-acetaminophen 5-325 MG tablet Commonly known as:  NORCO Take 1 tablet by mouth every 6 (six) hours as needed for moderate pain.   isosorbide-hydrALAZINE 20-37.5 MG tablet Commonly known as:  BIDIL Take 1 tablet by mouth 3 (three) times daily.   levothyroxine 25 MCG  tablet Commonly known as:  SYNTHROID, LEVOTHROID Take 25 mcg by mouth daily before breakfast.   metoprolol 50 MG tablet Commonly known as:  LOPRESSOR Take 25 mg by mouth 3 (three) times daily.   ORENCIA IV Inject into the vein every 30 (thirty) days.   ranitidine 150 MG tablet Commonly known as:  ZANTAC Take 150 mg by mouth 2 (two) times daily.   scopolamine 1 MG/3DAYS Commonly known as:  TRANSDERM-SCOP Place 1 patch (1.5 mg total) onto the skin once as needed (none in pt > 65. Consult anesthesiologist for pts with psychiatric disorders).   tamsulosin 0.4 MG Caps capsule Commonly known as:  FLOMAX Take 0.4 mg by mouth daily after supper.   TOUJEO SOLOSTAR 300 UNIT/ML Sopn Generic drug:  Insulin Glargine Inject 10 Units into the skin at bedtime.       Disposition and follow-up:   Mr.[name] was discharged from [facility] in Good condition.  At the hospital follow up visit please address:  1.   --Has he been able to take his Lasix 80mg  bid regularly? --Any increased swelling, shortness of breath? --A1c was 7.5; consider adjusting insulin dosing as seen appropriate  2.  Labs / imaging needed at time of follow-up:  --CBC to monitor anemia (hgb 10.5 during admission, b/l from our records is 12)  3.  Pending labs/ test needing follow-up: none  Follow-up Appointments: Follow-up  Information    William Brome, MD.   Specialties:  Family Medicine, Interventional Cardiology, Radiology, Anesthesiology Contact information: [place] [address] [address] [facility] Course by problem list: Active Problems:   Dyspnea   Acute on chronic diastolic heart failure: Patient with one week history of progressive dyspnea on exertion, orthopnea and swelling. He is usually compliant with his diuretic regimen of lasix 80mg  twice daily, but did miss about 3 days a couple of weeks ago due to running out of medications. He reports his  dry weight at 216lbs; at admission he weighed 222lbs. In the ED, he was placed on 2L [place] due to increased work of breathing. On exam he had mild bibasilar crackles, mildly elevated JVD, 1+ bilateral LE edema, swelling of his UE (per patient, watch fit tighter than usual). He had an elevated BMP in the 400's. Troponins were trended and remained at 0.1 x 3, but patient without chest pain and elevation likely due to heart strain with volume overload. He received IV lasix with good response; discharge weight was 214lbs. At discharge, patient was oxygenating well on room air, no increased work of breathing, chest was CTAB, and extremity swelling was back to normal per patient. He was continued on is BIDIL 20-37.5mg  TID, metoprolol tartrate 25mg  TID, and discharge on his home dose of lasix 80mg  BID.   Bronchiectasis: Patient with diagnosis of bronchiectasis, followed by Dr. Lake Bells. Last PFT's 10/17 were unremarkable and did not fit obstructive picture. High res chest CT in Sept consistent with bronchiectasis, bronchomalacia. Patient does have Rheumatoid Arthritis, which is associated with bronchiectasis. Patient with questionable mild bibasilar infiltrates on CXR and increase in sputum. Patient was given azithromycin 500mg  daily  x 5 days total. He was continued on his home dose of Breo.  Paroxysmal Atrial Fibrillation: Patient with history of PAF on Eliquis for coagulation and metroprolol tartrate 25mg  TID for rate control; these were continued.  T2DM Patient with history of DM controlled on Lantus 10U qhs. A1c during admission was 7.5.  CKD, stage 4 and anemia: Patient followed by [employer]. Cr remained stable and at around baseline during hospitalization. Anemia likely 2/2 chronic disease. Hgb 10.5 (b/l 12) but he is s/p recent toe amputation.   HTN: Patient was continued on his home regimen of metoprolol 25mg  TID and Bidil 20-37.5mg  TID during  hospitalization.  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 His synthroid 27mcg was continued.   Discharge Vitals:   BP 139/77 (BP Location: Right Arm)   Pulse (!) 101   Temp 98.4 F (36.9 C) (Oral)   Resp 16   Ht 5\' 8"  (1.727 m)   Wt 97.1 kg (214 lb 1.6 oz) Comment: c scale  SpO2 100%   BMI 32.55 kg/m   Pertinent Labs, Studies, and Procedures:  CBC Latest Ref Rng & Units 12/02/2015 12/01/2015 11/08/2015  WBC 4.0 - 10.5 K/uL 7.2 7.9 -  Hemoglobin 13.0 - 17.0 g/dL 10.4(L) 10.5(L) 12.9(L)  Hematocrit 39.0 - 52.0 % 31.0(L) 31.9(L) 38.0(L)  Platelets 150 - 400 K/uL 197 189 -   CMP Latest Ref Rng & Units 12/02/2015 12/01/2015 11/08/2015  Glucose 65 - 99 mg/dL 167(H) 198(H) 166(H)  BUN 6 - 20 mg/dL 23(H) 15 38(H)  Creatinine 0.61 - 1.24 mg/dL 2.07(H) 1.75(H) 2.20(H)  Sodium 135 - 145 mmol/L 140 139 139  Potassium 3.5 - 5.1 mmol/L 3.5 3.8 2.8(L)  Chloride 101 -  111 mmol/L 103 108 99(L)  CO2 22 - 32 mmol/L 28 24 -  Calcium 8.9 - 10.3 mg/dL 8.5(L) 8.6(L) -  Total Protein 6.5 - 8.1 g/dL 5.8(L) 5.6(L) -  Total Bilirubin 0.3 - 1.2 mg/dL 0.5 1.0 -  Alkaline Phos 38 - 126 U/L 86 83 -  AST 15 - 41 U/L 21 22 -  ALT 17 - 63 U/L 16(L) 16(L) -   CXR 12/11/2015: Cardiomegaly with vascular congestion. Small left pleural effusion. Bibasilar atelectasis or infiltrates, L>R.   ECHO 12/01/2015: - Left ventricle: The cavity size was normal. Wall thickness was increased in a pattern of mild LVH. Systolic function was normal. The estimated ejection fraction was in the range of 50% to 55%. Wall motion was normal; there were no regional wall motion abnormalities. Features are consistent with a pseudonormal left ventricular filling pattern, with concomitant abnormal relaxation and increased filling pressure (grade 2 diastolic dysfunction). - Mitral valve: There was mild to moderate regurgitation. - Left atrium: The atrium was mildly dilated. - Pericardium,  extracardiac: There was a left pleural effusion.  Discharge Instructions: Discharge Instructions    Diet - low sodium heart healthy    Complete by:  As directed    Increase activity slowly    Complete by:  As directed       Signed: Alphonzo Grieve, MD 12/02/2015, 1:01 PM   Pager [phone]

## 2015-12-02 NOTE — Progress Notes (Signed)
Spoke with patient about CPT that was ordered. Pt stated that he does Flutter valve and Chest vest at home BID. RT explained they had CPT vest order Q4 and pt stated he would rather do the Flutter valve instead of chest vest. Pt performed Flutter with great effort. Had a strong non-productive cough. Patient has stated that he has not been coughing or had anything to cough up lately. BBS were clear; diminished. RT will continue to work with patient at CPT Flutter.

## 2015-12-02 NOTE — Consult Note (Signed)
[facility] Nurse wound consult note Reason for Consult: post op care for surgical incision until seen again by surgeon in 2 weeks.  Wound type:Surgical Pressure Ulcer POA: No Measurement:4.4cm closely approximated incision at site of right great toe removal. No erythema, no edema, do fluctuance. Steri-strips are intact, overlapping, dry. Wound bed:See above Drainage (amount, consistency, odor) None Periwound:See above Dressing procedure/placement/frequency:Supplies provided for 6 days of dressing changes and bedside RN is asked to teach patient how to perform this dry dressing. Patient aware of reportable signs and symptoms (fever, induraton, erythema, increased warmth, drainage) and plans to keep next scheduled appointment with surgeon in 2 weeks. [place] nursing team will not follow, but will remain available to this patient, the nursing and medical teams.  Please re-consult if needed. Thanks, Maudie Flakes, MSN, RN, [place], Arther Abbott  Pager# [phone]

## 2015-12-02 NOTE — Progress Notes (Signed)
   Subjective:  No acute events overnight. Feeling well.  Has been using the flutter valve. Having some sputum production.   Objective:  Vital signs in last 24 hours: Vitals:   12/02/15 0613 12/02/15 0735 12/02/15 1037 12/02/15 1041  BP: 139/77     Pulse: 74  89 (!) 101  Resp: 17  16 16   Temp: 98.4 F (36.9 C)     TempSrc: Oral     SpO2: 98% 96% 100% 100%  Weight: 214 lb 1.6 oz (97.1 kg)     Height:       General: Vital signs reviewed. Patient in no acute distress Cardiovascular: regular rate, rhythm, no murmur appreciated  Pulmonary/Chest: Clear to auscultation bilaterally, no wheezes, rales, or rhonchi. Abdominal: Soft, non-tender, non-distended, BS + Extremities: Mild pitting edema bilaterally. Left ankle still mildly swollen from the surgery few weeks ago.  Skin: Warm, dry and intact. No rashes or erythema.    Assessment/Plan:  Active Problems:   Dyspnea  Acute on chronic diastolic heart failure: Patient presented with one week history of progressive dyspnea on exertion, orthopnea and swelling. Says his dry weight is around 216 lbs. Says he missed 3 days of his chronic diuretic regimen 2 weeks ago, which is likely what precipitated this episode. He has had chronically elevated BNPs. He responded well to IV diuresis, and net output was 3700 mL  And weight is 214 pounds today. Echo was performed on this admission which showed EF of 50-55%  And normal wall motion, but grade 2 diastolic dysfunction with some mitral valve regurgitation.   This morning he does not feel dyspneic, and on ambulation, his O2 sats maintained at 100% without oxygen.   -resume home dose of lasix 80 mg BID on discharge -continue Bidil    History of bronchiectasis: Pt follows outpatient pulmonology and follows up regularly. PFTs were done recently which were unremarkable. He uses vest therapy at home. He did have some increase in sputum production so we will treat with 5 days total of  azithromycin.  -azithromycin for 5 days  -stop prednisone  -continue breo -flutter valve   PAF:  Currently rate controlled and regular rate -continue eliquis and metoprolol   Dispo: Anticipated discharge in approximately 0 day(s).   Burgess Estelle, MD 12/02/2015, 11:44 AM

## 2015-12-02 NOTE — Discharge Instructions (Signed)
Please take the antibiotic daily for 4 more days  Please continue your regular lasix twice a day as you originally take. Please try to not miss any doses.   Please follow up with your primary doctor.  It was a pleasure taking care of you

## 2015-12-02 NOTE — Progress Notes (Signed)
Troponin level 0.13. Previous level was 0.11. MD notified.  Patient is resting without any complaints at this time. Will continue to monitor.  Paisley Grajeda, RN

## 2015-12-03 LAB — VITAMIN D 25 HYDROXY (VIT D DEFICIENCY, FRACTURES): Vit D, 25-Hydroxy: 27.9 ng/mL — ABNORMAL LOW (ref 30.0–100.0)

## 2015-12-06 LAB — CULTURE, BLOOD (ROUTINE X 2)
Culture: NO GROWTH
Culture: NO GROWTH

## 2015-12-12 DIAGNOSIS — Z79899 Other long term (current) drug therapy: Secondary | ICD-10-CM | POA: Diagnosis not present

## 2015-12-12 DIAGNOSIS — M0589 Other rheumatoid arthritis with rheumatoid factor of multiple sites: Secondary | ICD-10-CM | POA: Diagnosis not present

## 2015-12-18 ENCOUNTER — Ambulatory Visit: Payer: PRIVATE HEALTH INSURANCE | Admitting: Adult Health

## 2015-12-25 DIAGNOSIS — H353131 Nonexudative age-related macular degeneration, bilateral, early dry stage: Secondary | ICD-10-CM | POA: Diagnosis not present

## 2015-12-25 DIAGNOSIS — I10 Essential (primary) hypertension: Secondary | ICD-10-CM | POA: Diagnosis not present

## 2015-12-27 ENCOUNTER — Other Ambulatory Visit: Payer: Medicare Other

## 2015-12-27 DIAGNOSIS — B488 Other specified mycoses: Secondary | ICD-10-CM

## 2015-12-27 DIAGNOSIS — J479 Bronchiectasis, uncomplicated: Secondary | ICD-10-CM

## 2015-12-28 DIAGNOSIS — Z89412 Acquired absence of left great toe: Secondary | ICD-10-CM | POA: Diagnosis not present

## 2015-12-28 DIAGNOSIS — Z4781 Encounter for orthopedic aftercare following surgical amputation: Secondary | ICD-10-CM | POA: Diagnosis not present

## 2015-12-29 LAB — RESPIRATORY CULTURE OR RESPIRATORY AND SPUTUM CULTURE
GRAM STAIN: NONE SEEN
Organism ID, Bacteria: NORMAL

## 2016-01-01 ENCOUNTER — Ambulatory Visit (INDEPENDENT_AMBULATORY_CARE_PROVIDER_SITE_OTHER): Payer: Medicare Other | Admitting: Adult Health

## 2016-01-01 ENCOUNTER — Encounter: Payer: Self-pay | Admitting: Adult Health

## 2016-01-01 DIAGNOSIS — Z23 Encounter for immunization: Secondary | ICD-10-CM | POA: Diagnosis not present

## 2016-01-01 DIAGNOSIS — I5033 Acute on chronic diastolic (congestive) heart failure: Secondary | ICD-10-CM | POA: Diagnosis not present

## 2016-01-01 DIAGNOSIS — I5020 Unspecified systolic (congestive) heart failure: Secondary | ICD-10-CM | POA: Diagnosis not present

## 2016-01-01 DIAGNOSIS — E1129 Type 2 diabetes mellitus with other diabetic kidney complication: Secondary | ICD-10-CM | POA: Diagnosis not present

## 2016-01-01 DIAGNOSIS — J449 Chronic obstructive pulmonary disease, unspecified: Secondary | ICD-10-CM | POA: Diagnosis not present

## 2016-01-01 DIAGNOSIS — J479 Bronchiectasis, uncomplicated: Secondary | ICD-10-CM

## 2016-01-01 DIAGNOSIS — Z1389 Encounter for screening for other disorder: Secondary | ICD-10-CM | POA: Diagnosis not present

## 2016-01-01 DIAGNOSIS — E113393 Type 2 diabetes mellitus with moderate nonproliferative diabetic retinopathy without macular edema, bilateral: Secondary | ICD-10-CM | POA: Diagnosis not present

## 2016-01-01 DIAGNOSIS — E119 Type 2 diabetes mellitus without complications: Secondary | ICD-10-CM | POA: Diagnosis not present

## 2016-01-01 DIAGNOSIS — N184 Chronic kidney disease, stage 4 (severe): Secondary | ICD-10-CM | POA: Diagnosis not present

## 2016-01-01 MED ORDER — FLUTICASONE FUROATE-VILANTEROL 100-25 MCG/INH IN AEPB: 1.0000 | INHALATION_SPRAY | Freq: Every day | RESPIRATORY_TRACT | 0 refills | Status: DC

## 2016-01-01 MED ORDER — FLUTICASONE FUROATE-VILANTEROL 100-25 MCG/INH IN AEPB: 1.0000 | INHALATION_SPRAY | Freq: Every day | RESPIRATORY_TRACT | 5 refills | Status: DC

## 2016-01-01 NOTE — Patient Instructions (Signed)
Continue on BREO daily, rinse after use.  Continue on Mucinex Twice daily   Continue on VEST and Flutter Valve .  Follow up Dr. Lake Bells in 3-4 months and As needed

## 2016-01-01 NOTE — Addendum Note (Signed)
Addended by: Doroteo Glassman D on: 01/01/2016 03:39 PM   Modules accepted: Orders

## 2016-01-01 NOTE — Assessment & Plan Note (Signed)
Controlled on BREO   Plan  No change

## 2016-01-01 NOTE — Progress Notes (Addendum)
@Patient  ID: [name], male    DOB: 1930-06-07, 82 y.o.   MRN: [id number]  Chief Complaint  Patient presents with  . Follow-up    Bronchiectasis     Referring provider: [name], MD  HPI: 82 year old male followed for bronchiectasis and asthma Patient does have underlying rheumatoid arthritis.  Test December 2015 PFT showed FEV1 at 94%, FVC 79%, ratio 84%, DLCO 64%. FT October 2017 showed FVC of 83%, FEV1 104%, normal ratio, DLCO 78%. CT chest September 2017 showed lower lobe bronchiectasis with nonspecific fibrotic changes   01/01/2016 Follow up : Bronchiectasis  Patient returns for a two-month follow-up. He is followed for chronic asthma and bronchiectasis. She was having more difficulty with mucociliary clearance. Last visit. He was started on the VEST therapy  And mucinex .  Sputum cx last week was normal . AFB /Fungal final pending .  Really likes VEST , uses it Twice daily  . Feels it really helps to get up the mucus.  On BREO daily . Rare SABA .   Was admitted last month for [facility] improved with diuresis . Hosptai notes reviewed.  2-D echo on 12/01/2015 showed EF of 99991111, grade 2 diastolic dysfunction He is feeling better w/ less dyspnea /edema.    No Known Allergies  Immunization History  Administered Date(s) Administered  . Influenza Split 09/28/2013, 08/29/2015  . Influenza-Unspecified 09/13/2012  . Pneumococcal Conjugate-13 01/01/2016  . Pneumococcal-Unspecified 09/13/2012    Past Medical History:  Diagnosis Date  . Anemia   . Atrial fibrillation ([place]) 12/08/2013  . Cataract   . CHF (congestive heart failure) ([place])   . Chronic kidney disease   . Constipation    takes stool softener daily  . Dysrhythmia    a-fib  . GERD (gastroesophageal reflux disease)    takes Zantac daily   . History of blood transfusion    "related to one of my ORs"  . Hyperlipidemia    takes Pravastatin daily  . Hypertension    takes Cardizem,Proscar,and Lisinopril  daily  . Hypothyroidism   . Impaired hearing    wears hearing aids  . Joint pain   . Joint swelling   . Peripheral neuropathy ([place])   . Rheumatoid arthritis ([place])    "all over; take orencia  q 4 wks" (02/01/2013)  . Type II diabetes mellitus (HCC)    takes Metformin daily    Tobacco History: History  Smoking Status  . Former Smoker  . Packs/day: 1.00  . Years: 15.00  . Types: Cigarettes  . Quit date: 09/01/1965  Smokeless Tobacco  . Never Used    Comment: quit smoking in 1967   Counseling given: Not Answered   Outpatient Encounter Prescriptions as of 01/01/2016  Medication Sig  . Abatacept (ORENCIA IV) Inject into the vein every 30 (thirty) days.  [name] albuterol (PROVENTIL) (2.5 MG/3ML) 0.083% nebulizer solution Take 3 mLs (2.5 mg total) by nebulization every 6 (six) hours as needed for wheezing or shortness of breath.  [name] atorvastatin (LIPITOR) 20 MG tablet Take 1 tablet (20 mg total) by mouth daily at 6 PM. (Patient taking differently: Take 40 mg by mouth daily at 6 PM. )  . cetirizine (ZYRTEC) 10 MG tablet Take 10 mg by mouth daily.  [name] ELIQUIS 2.5 MG TABS tablet Take 2.5 mg by mouth 2 (two) times daily.  . ferrous sulfate 325 (65 FE) MG tablet Take 325 mg by mouth daily with breakfast.   . finasteride (PROSCAR) 5 MG tablet  Take 5 mg by mouth at bedtime.   . fluticasone furoate-vilanterol (BREO ELLIPTA) 100-25 MCG/INH AEPB Inhale 1 puff into the lungs daily.  . folic acid (FOLVITE) 1 MG tablet Take 1 mg by mouth daily.    . furosemide (LASIX) 80 MG tablet Take 80 mg by mouth 2 (two) times daily.  [name] gabapentin (NEURONTIN) 300 MG capsule Take 1 capsule (300 mg total) by mouth 2 (two) times daily.  [name] guaiFENesin (MUCINEX) 600 MG 12 hr tablet Take by mouth 2 (two) times daily.  [name] HYDROcodone-acetaminophen (NORCO) 5-325 MG per tablet Take 1 tablet by mouth every 6 (six) hours as needed for moderate pain.  . Insulin Glargine (TOUJEO SOLOSTAR) 300 UNIT/ML SOPN Inject 10 Units into the  skin at bedtime.   . isosorbide-hydrALAZINE (BIDIL) 20-37.5 MG tablet Take 1 tablet by mouth 3 (three) times daily.  [name] levothyroxine (SYNTHROID, LEVOTHROID) 25 MCG tablet Take 25 mcg by mouth daily before breakfast.  . metoprolol (LOPRESSOR) 50 MG tablet Take 25 mg by mouth 3 (three) times daily.  . Multiple Vitamins-Minerals (CENTRUM SILVER PO) Take 1 tablet by mouth daily.  . potassium chloride (K-DUR) 10 MEQ tablet Take 10 mEq by mouth daily.  . ranitidine (ZANTAC) 150 MG tablet Take 150 mg by mouth 2 (two) times daily.    [name] scopolamine (TRANSDERM-SCOP) 1 MG/3DAYS Place 1 patch (1.5 mg total) onto the skin once as needed (none in pt > 65. Consult anesthesiologist for pts with psychiatric disorders).  . Tamsulosin HCl (FLOMAX) 0.4 MG CAPS Take 0.4 mg by mouth daily after supper.   . [DISCONTINUED] azithromycin (ZITHROMAX) 500 MG tablet Take 1 tablet (500 mg total) by mouth daily. For 4 more days   No facility-administered encounter medications on file as of 01/01/2016.      Review of Systems  Constitutional:   No  weight loss, night sweats,  Fevers, chills, fatigue, or  lassitude.  HEENT:   No headaches,  Difficulty swallowing,  Tooth/dental problems, or  Sore throat,                No sneezing, itching, ear ache,  +nasal congestion, post nasal drip,   CV:  No chest pain,  Orthopnea, PND, swelling in lower extremities, anasarca, dizziness, palpitations, syncope.   GI  No heartburn, indigestion, abdominal pain, nausea, vomiting, diarrhea, change in bowel habits, loss of appetite, bloody stools.   Resp:    No chest wall deformity  Skin: no rash or lesions.  GU: no dysuria, change in color of urine, no urgency or frequency.  No flank pain, no hematuria   MS:  No joint pain or swelling.  No decreased range of motion.  No back pain.    Physical Exam  BP 138/72   Pulse 83   Temp 98.2 F (36.8 C) (Oral)   Ht 5\' 8"  (1.727 m)   Wt 217 lb 9.6 oz (98.7 kg)   SpO2 96%   BMI 33.09  kg/m   GEN: A/Ox3; pleasant , NAD, elderly    HEENT:  [place]/AT,  EACs-clear, TMs-wnl, NOSE-clear, THROAT-clear, no lesions, no postnasal drip or exudate noted.   NECK:  Supple w/ fair ROM; no JVD; normal carotid impulses w/o bruits; no thyromegaly or nodules palpated; no lymphadenopathy.    RESP  Few trace rhonchi ,  no accessory muscle use, no dullness to percussion  CARD:  RRR, no m/r/g, tr -1+  peripheral edema, pulses intact, no cyanosis or clubbing.  GI:   Soft &  nt; nml bowel sounds; no organomegaly or masses detected.   Musco: Warm bil, no deformities or joint swelling noted.   Neuro: alert, no focal deficits noted.    Skin: Warm, no lesions or rashes  Psych:  No change in mood or affect. No depression or anxiety.  No memory loss.  Lab Results:  CBC    Component Value Date/Time   WBC 7.2 12/02/2015 0102   RBC 3.41 (L) 12/02/2015 0102   HGB 10.4 (L) 12/02/2015 0102   HCT 31.0 (L) 12/02/2015 0102   PLT 197 12/02/2015 0102   MCV 90.9 12/02/2015 0102   MCH 30.5 12/02/2015 0102   MCHC 33.5 12/02/2015 0102   RDW 14.2 12/02/2015 0102   LYMPHSABS 0.8 12/01/2015 0756   MONOABS 0.6 12/01/2015 0756   EOSABS 0.3 12/01/2015 0756   BASOSABS 0.1 12/01/2015 0756    BMET    Component Value Date/Time   NA 140 12/02/2015 0102   K 3.5 12/02/2015 0102   CL 103 12/02/2015 0102   CO2 28 12/02/2015 0102   GLUCOSE 167 (H) 12/02/2015 0102   BUN 23 (H) 12/02/2015 0102   CREATININE 2.07 (H) 12/02/2015 0102   CALCIUM 8.5 (L) 12/02/2015 0102   GFRNONAA 28 (L) 12/02/2015 0102   GFRAA 32 (L) 12/02/2015 0102    BNP    Component Value Date/Time   BNP 429.8 (H) 12/01/2015 0756    ProBNP    Component Value Date/Time   PROBNP 3,799.0 (H) 12/13/2013 0320    Imaging: No results found.   Assessment & Plan:   CHF (congestive heart failure) Recent decompensation now improved  Cont on current regimen    Chronic obstructive airway disease with asthma ([place]) Controlled on BREO    Plan  No change   Bronchiectasis without acute exacerbation (HCC) Improved control with Vest and Flutter  Cont on BREO  Prelim sputum cx Simonne Maffucci /Fung neg  Follow final cx       Rexene Edison, NP 01/01/2016

## 2016-01-01 NOTE — Assessment & Plan Note (Signed)
Recent decompensation now improved  Cont on current regimen

## 2016-01-01 NOTE — Assessment & Plan Note (Signed)
Improved control with Vest and Flutter  Cont on BREO  Prelim sputum cx /AFB /Fung neg  Follow final cx

## 2016-01-02 NOTE — Progress Notes (Signed)
Reviewed, agree 

## 2016-01-10 ENCOUNTER — Encounter: Payer: Self-pay | Admitting: Pulmonary Disease

## 2016-01-10 DIAGNOSIS — M0589 Other rheumatoid arthritis with rheumatoid factor of multiple sites: Secondary | ICD-10-CM | POA: Diagnosis not present

## 2016-01-10 DIAGNOSIS — B488 Other specified mycoses: Secondary | ICD-10-CM | POA: Insufficient documentation

## 2016-01-15 DIAGNOSIS — N183 Chronic kidney disease, stage 3 (moderate): Secondary | ICD-10-CM | POA: Diagnosis not present

## 2016-01-15 DIAGNOSIS — Z79899 Other long term (current) drug therapy: Secondary | ICD-10-CM | POA: Diagnosis not present

## 2016-01-15 DIAGNOSIS — M0579 Rheumatoid arthritis with rheumatoid factor of multiple sites without organ or systems involvement: Secondary | ICD-10-CM | POA: Diagnosis not present

## 2016-01-15 DIAGNOSIS — M2550 Pain in unspecified joint: Secondary | ICD-10-CM | POA: Diagnosis not present

## 2016-01-15 DIAGNOSIS — M545 Low back pain: Secondary | ICD-10-CM | POA: Diagnosis not present

## 2016-01-15 DIAGNOSIS — E669 Obesity, unspecified: Secondary | ICD-10-CM | POA: Diagnosis not present

## 2016-01-15 DIAGNOSIS — M170 Bilateral primary osteoarthritis of knee: Secondary | ICD-10-CM | POA: Diagnosis not present

## 2016-01-15 DIAGNOSIS — Z6832 Body mass index (BMI) 32.0-32.9, adult: Secondary | ICD-10-CM | POA: Diagnosis not present

## 2016-01-21 DIAGNOSIS — E782 Mixed hyperlipidemia: Secondary | ICD-10-CM | POA: Diagnosis not present

## 2016-01-25 ENCOUNTER — Telehealth: Payer: Self-pay | Admitting: Pulmonary Disease

## 2016-01-25 NOTE — Telephone Encounter (Signed)
Spoke with pt's daughter (dpr on file) , shared sputum results/recs. With her as pt could not remember what exactly was said when Irene Shipper discussed results with him as documented on 12/29.  Nothing further needed at this time.

## 2016-01-29 LAB — FUNGUS CULTURE W SMEAR

## 2016-02-01 DIAGNOSIS — N2581 Secondary hyperparathyroidism of renal origin: Secondary | ICD-10-CM | POA: Diagnosis not present

## 2016-02-01 DIAGNOSIS — I509 Heart failure, unspecified: Secondary | ICD-10-CM | POA: Diagnosis not present

## 2016-02-01 DIAGNOSIS — E1129 Type 2 diabetes mellitus with other diabetic kidney complication: Secondary | ICD-10-CM | POA: Diagnosis not present

## 2016-02-01 DIAGNOSIS — N184 Chronic kidney disease, stage 4 (severe): Secondary | ICD-10-CM | POA: Diagnosis not present

## 2016-02-01 DIAGNOSIS — I129 Hypertensive chronic kidney disease with stage 1 through stage 4 chronic kidney disease, or unspecified chronic kidney disease: Secondary | ICD-10-CM | POA: Diagnosis not present

## 2016-02-01 DIAGNOSIS — D638 Anemia in other chronic diseases classified elsewhere: Secondary | ICD-10-CM | POA: Diagnosis not present

## 2016-02-07 DIAGNOSIS — M0589 Other rheumatoid arthritis with rheumatoid factor of multiple sites: Secondary | ICD-10-CM | POA: Diagnosis not present

## 2016-02-11 DIAGNOSIS — L03113 Cellulitis of right upper limb: Secondary | ICD-10-CM | POA: Diagnosis not present

## 2016-02-11 DIAGNOSIS — L03114 Cellulitis of left upper limb: Secondary | ICD-10-CM | POA: Diagnosis not present

## 2016-02-14 ENCOUNTER — Telehealth: Payer: Self-pay | Admitting: Pulmonary Disease

## 2016-02-14 NOTE — Telephone Encounter (Signed)
Spoke with pt's daughter, Leavy Cella. Advised her that we do not have any documentation where anyone from our office contacted the pt. She thinks he might have an old message on his answering machine. Nothing further was needed.

## 2016-02-15 DIAGNOSIS — L03129 Acute lymphangitis of unspecified part of limb: Secondary | ICD-10-CM | POA: Diagnosis not present

## 2016-02-15 LAB — AFB CULTURE WITH SMEAR (NOT AT ARMC)

## 2016-03-04 DIAGNOSIS — Z0000 Encounter for general adult medical examination without abnormal findings: Secondary | ICD-10-CM | POA: Diagnosis not present

## 2016-03-06 DIAGNOSIS — Z79899 Other long term (current) drug therapy: Secondary | ICD-10-CM | POA: Diagnosis not present

## 2016-03-06 DIAGNOSIS — M0589 Other rheumatoid arthritis with rheumatoid factor of multiple sites: Secondary | ICD-10-CM | POA: Diagnosis not present

## 2016-03-13 DIAGNOSIS — H10022 Other mucopurulent conjunctivitis, left eye: Secondary | ICD-10-CM | POA: Diagnosis not present

## 2016-03-13 DIAGNOSIS — L218 Other seborrheic dermatitis: Secondary | ICD-10-CM | POA: Diagnosis not present

## 2016-03-13 DIAGNOSIS — J158 Pneumonia due to other specified bacteria: Secondary | ICD-10-CM | POA: Diagnosis not present

## 2016-03-18 DIAGNOSIS — R05 Cough: Secondary | ICD-10-CM | POA: Diagnosis not present

## 2016-04-07 DIAGNOSIS — M0589 Other rheumatoid arthritis with rheumatoid factor of multiple sites: Secondary | ICD-10-CM | POA: Diagnosis not present

## 2016-04-08 ENCOUNTER — Ambulatory Visit: Payer: PRIVATE HEALTH INSURANCE | Admitting: Pulmonary Disease

## 2016-04-18 DIAGNOSIS — L219 Seborrheic dermatitis, unspecified: Secondary | ICD-10-CM | POA: Diagnosis not present

## 2016-04-22 DIAGNOSIS — M545 Low back pain: Secondary | ICD-10-CM | POA: Diagnosis not present

## 2016-04-22 DIAGNOSIS — E669 Obesity, unspecified: Secondary | ICD-10-CM | POA: Diagnosis not present

## 2016-04-22 DIAGNOSIS — Z79899 Other long term (current) drug therapy: Secondary | ICD-10-CM | POA: Diagnosis not present

## 2016-04-22 DIAGNOSIS — M0579 Rheumatoid arthritis with rheumatoid factor of multiple sites without organ or systems involvement: Secondary | ICD-10-CM | POA: Diagnosis not present

## 2016-04-22 DIAGNOSIS — N183 Chronic kidney disease, stage 3 (moderate): Secondary | ICD-10-CM | POA: Diagnosis not present

## 2016-04-22 DIAGNOSIS — M79672 Pain in left foot: Secondary | ICD-10-CM | POA: Diagnosis not present

## 2016-04-22 DIAGNOSIS — M2550 Pain in unspecified joint: Secondary | ICD-10-CM | POA: Diagnosis not present

## 2016-04-22 DIAGNOSIS — M170 Bilateral primary osteoarthritis of knee: Secondary | ICD-10-CM | POA: Diagnosis not present

## 2016-04-22 DIAGNOSIS — Z6832 Body mass index (BMI) 32.0-32.9, adult: Secondary | ICD-10-CM | POA: Diagnosis not present

## 2016-05-05 DIAGNOSIS — M0589 Other rheumatoid arthritis with rheumatoid factor of multiple sites: Secondary | ICD-10-CM | POA: Diagnosis not present

## 2016-05-08 ENCOUNTER — Ambulatory Visit (INDEPENDENT_AMBULATORY_CARE_PROVIDER_SITE_OTHER): Payer: Medicare Other | Admitting: Podiatry

## 2016-05-08 ENCOUNTER — Ambulatory Visit (INDEPENDENT_AMBULATORY_CARE_PROVIDER_SITE_OTHER): Payer: Medicare Other

## 2016-05-08 ENCOUNTER — Encounter: Payer: Self-pay | Admitting: Podiatry

## 2016-05-08 DIAGNOSIS — M79672 Pain in left foot: Principal | ICD-10-CM

## 2016-05-08 DIAGNOSIS — R2681 Unsteadiness on feet: Secondary | ICD-10-CM

## 2016-05-08 DIAGNOSIS — M1990 Unspecified osteoarthritis, unspecified site: Secondary | ICD-10-CM | POA: Diagnosis not present

## 2016-05-08 DIAGNOSIS — M79671 Pain in right foot: Secondary | ICD-10-CM

## 2016-05-08 NOTE — Progress Notes (Signed)
   Subjective:    Patient ID: [name], male    DOB: [birth year], 83 y.o.   MRN: [id number]  HPI: He presents today with his daughter with a chief complaint of pain from his ankle through his midfoot. He states that 5 years ago had a fusion of his left ankle due to arthritis. Not long after that he lost the left great toe. He states that he feels like he has unbalanced he feels that his forefoot is striking before his heel he has. He feels that his midfoot and forefoot are starting to swing out laterally.    Review of Systems  HENT: Positive for hearing loss.   Respiratory: Positive for apnea and shortness of breath.   Genitourinary: Positive for frequency and urgency.  Musculoskeletal: Positive for arthralgias and gait problem.  Neurological: Positive for weakness.  Hematological: Bruises/bleeds easily.  All other systems reviewed and are negative.      Objective:   Physical Exam: Vital signs are stable he is alert and oriented 3 pulses are palpable bilateral. Moderate to severe edema left foot which has been present since the time of surgery. Right foot appears to be relatively normal. Neurologic sensorium is intact muscle strength is normal he has no range of motion at the ankle joint left. He has some eversion and abduction within the subtalar joint and midfoot that is resulting in a forefoot valgus type position. He does have a forefoot stance with more pressure on the forefoot than on the rear with strike. He does appear to have some balance issue. Radiographically fusion of the left ankle appears to be good amputation at the first metatarsophalangeal joint of the hallux appears to have gone heal uneventfully no signs of osteomyelitis and signs of infection bone stock actually looks pretty good with minimal osteoarthritic changes of the midfoot.        Assessment & Plan:  Assessment: Painful forefoot status post fusion left. Painful ankle status post fusion left. Amputation of the  hallux left.  Plan: At this point I think an AFO will be his best bet I had initially thought an [place] brace may help hold the foot in better position but I have a feeling that the swelling may be problematic. At this point I'm going to refer him to [facility] for evaluation and AFO advice. I feel that possibly even a more balanced brace with a toe filler may be an option for Mr. [name]. Obviously he does not need anything articulated at the ankle.

## 2016-05-14 ENCOUNTER — Other Ambulatory Visit: Payer: Medicare Other

## 2016-05-15 DIAGNOSIS — I129 Hypertensive chronic kidney disease with stage 1 through stage 4 chronic kidney disease, or unspecified chronic kidney disease: Secondary | ICD-10-CM | POA: Diagnosis not present

## 2016-05-15 DIAGNOSIS — I509 Heart failure, unspecified: Secondary | ICD-10-CM | POA: Diagnosis not present

## 2016-05-15 DIAGNOSIS — N179 Acute kidney failure, unspecified: Secondary | ICD-10-CM | POA: Diagnosis not present

## 2016-05-15 DIAGNOSIS — I4891 Unspecified atrial fibrillation: Secondary | ICD-10-CM | POA: Diagnosis not present

## 2016-05-15 DIAGNOSIS — D638 Anemia in other chronic diseases classified elsewhere: Secondary | ICD-10-CM | POA: Diagnosis not present

## 2016-05-15 DIAGNOSIS — N2581 Secondary hyperparathyroidism of renal origin: Secondary | ICD-10-CM | POA: Diagnosis not present

## 2016-05-15 DIAGNOSIS — E1129 Type 2 diabetes mellitus with other diabetic kidney complication: Secondary | ICD-10-CM | POA: Diagnosis not present

## 2016-05-15 DIAGNOSIS — N3946 Mixed incontinence: Secondary | ICD-10-CM | POA: Diagnosis not present

## 2016-05-15 DIAGNOSIS — R339 Retention of urine, unspecified: Secondary | ICD-10-CM | POA: Diagnosis not present

## 2016-05-15 DIAGNOSIS — N184 Chronic kidney disease, stage 4 (severe): Secondary | ICD-10-CM | POA: Diagnosis not present

## 2016-05-23 DIAGNOSIS — D485 Neoplasm of uncertain behavior of skin: Secondary | ICD-10-CM | POA: Diagnosis not present

## 2016-05-23 DIAGNOSIS — L239 Allergic contact dermatitis, unspecified cause: Secondary | ICD-10-CM | POA: Diagnosis not present

## 2016-05-28 ENCOUNTER — Other Ambulatory Visit: Payer: Medicare Other

## 2016-05-29 DIAGNOSIS — L245 Irritant contact dermatitis due to other chemical products: Secondary | ICD-10-CM | POA: Diagnosis not present

## 2016-05-29 DIAGNOSIS — L219 Seborrheic dermatitis, unspecified: Secondary | ICD-10-CM | POA: Diagnosis not present

## 2016-06-02 DIAGNOSIS — Z79899 Other long term (current) drug therapy: Secondary | ICD-10-CM | POA: Diagnosis not present

## 2016-06-02 DIAGNOSIS — M0589 Other rheumatoid arthritis with rheumatoid factor of multiple sites: Secondary | ICD-10-CM | POA: Diagnosis not present

## 2016-06-06 ENCOUNTER — Ambulatory Visit: Payer: Medicare Other | Admitting: Pulmonary Disease

## 2016-06-18 ENCOUNTER — Ambulatory Visit: Payer: Medicare Other | Admitting: Orthotics

## 2016-06-18 DIAGNOSIS — H01025 Squamous blepharitis left lower eyelid: Secondary | ICD-10-CM | POA: Diagnosis not present

## 2016-06-18 DIAGNOSIS — H01024 Squamous blepharitis left upper eyelid: Secondary | ICD-10-CM | POA: Diagnosis not present

## 2016-06-24 ENCOUNTER — Ambulatory Visit: Payer: Medicare Other | Admitting: Orthotics

## 2016-06-30 DIAGNOSIS — M0589 Other rheumatoid arthritis with rheumatoid factor of multiple sites: Secondary | ICD-10-CM | POA: Diagnosis not present

## 2016-06-30 DIAGNOSIS — Z79899 Other long term (current) drug therapy: Secondary | ICD-10-CM | POA: Diagnosis not present

## 2016-07-01 ENCOUNTER — Ambulatory Visit (INDEPENDENT_AMBULATORY_CARE_PROVIDER_SITE_OTHER): Payer: Medicare Other | Admitting: Pulmonary Disease

## 2016-07-01 ENCOUNTER — Other Ambulatory Visit (INDEPENDENT_AMBULATORY_CARE_PROVIDER_SITE_OTHER): Payer: Medicare Other

## 2016-07-01 ENCOUNTER — Encounter: Payer: Self-pay | Admitting: Pulmonary Disease

## 2016-07-01 VITALS — BP 126/62 | HR 74 | Ht 68.0 in | Wt 213.2 lb

## 2016-07-01 DIAGNOSIS — J479 Bronchiectasis, uncomplicated: Secondary | ICD-10-CM

## 2016-07-01 DIAGNOSIS — R0600 Dyspnea, unspecified: Secondary | ICD-10-CM

## 2016-07-01 DIAGNOSIS — I110 Hypertensive heart disease with heart failure: Secondary | ICD-10-CM | POA: Diagnosis not present

## 2016-07-01 DIAGNOSIS — J449 Chronic obstructive pulmonary disease, unspecified: Secondary | ICD-10-CM | POA: Diagnosis not present

## 2016-07-01 DIAGNOSIS — B488 Other specified mycoses: Secondary | ICD-10-CM

## 2016-07-01 LAB — CBC WITH DIFFERENTIAL/PLATELET
BASOS ABS: 0.1 10*3/uL (ref 0.0–0.1)
Basophils Relative: 0.9 % (ref 0.0–3.0)
Eosinophils Absolute: 0.2 10*3/uL (ref 0.0–0.7)
Eosinophils Relative: 2.3 % (ref 0.0–5.0)
HCT: 36.6 % — ABNORMAL LOW (ref 39.0–52.0)
Hemoglobin: 12.2 g/dL — ABNORMAL LOW (ref 13.0–17.0)
Lymphocytes Relative: 18.1 % (ref 12.0–46.0)
Lymphs Abs: 1.7 10*3/uL (ref 0.7–4.0)
MCHC: 33.3 g/dL (ref 30.0–36.0)
MCV: 90.2 fl (ref 78.0–100.0)
MONO ABS: 0.7 10*3/uL (ref 0.1–1.0)
Monocytes Relative: 7.4 % (ref 3.0–12.0)
NEUTROS PCT: 71.3 % (ref 43.0–77.0)
Neutro Abs: 6.6 10*3/uL (ref 1.4–7.7)
Platelets: 240 10*3/uL (ref 150.0–400.0)
RBC: 4.06 Mil/uL — AB (ref 4.22–5.81)
RDW: 15.3 % (ref 11.5–15.5)
WBC: 9.2 10*3/uL (ref 4.0–10.5)

## 2016-07-01 MED ORDER — ALBUTEROL SULFATE (2.5 MG/3ML) 0.083% IN NEBU: 2.5000 mg | INHALATION_SOLUTION | Freq: Four times a day (QID) | RESPIRATORY_TRACT | 11 refills | Status: DC | PRN

## 2016-07-01 NOTE — Patient Instructions (Signed)
We will call you with the results of your hemoglobin value Use the albuterol twice a day for the next few weeks Exercise regularly Follow-up with Dr. Einar Gip with cardiology We will see you back in 4 weeks to see how you are doing

## 2016-07-01 NOTE — Assessment & Plan Note (Signed)
Continue therapy vest An albuterol twice a day Continue 3

## 2016-07-01 NOTE — Assessment & Plan Note (Signed)
As above.

## 2016-07-01 NOTE — Assessment & Plan Note (Signed)
He has LVH was diastolic dysfunction but it's not clear to me on exam today that he is volume overloaded as a cause of his dyspnea. I would like for him to follow-up with cardiology to discuss further.

## 2016-07-01 NOTE — Progress Notes (Signed)
Subjective:    Patient ID: [name], male    DOB: Dec 04, 1930, 83 y.o.   MRN: [id number]  Synopsis: Former patient of Dr. Joya Gaskins who has bronchiectasis and asthma. Also has rheumatoid arthritis. He notes that he had severe pneumonia when he fought in the [place] war and ended up coming home form the war and he had to be admitted to [facility] where he was cared for several months.   HPI Chief Complaint  Patient presents with  . Follow-up    pt c/o increased sob with any exertion, sometimes prod cough worse qam.  Needs new nebulizer as well.    [name] says taht he is having more trouble breathing lately.  He has been told that this oxygen level is normal but just walking a few feet makes him dyspneic.  He feels that dyspnea is worsening.  He feels more chest tightness and feels like he can't enough air in.  He denies chest pain.  He has leg swelling which is maybe the same as it always has been, he doesn't think its worse.  It worsens throughout the daytime.  He still takes [employer].  He says taht he works once a week which is harder to do now because it involves a lot of walking.  He has to climb a hill which is 25-30 yards whic his hard.    His weight has been the same.    He has not use albuterol in a few months.  He can take a deep breath when he uses his therapy vest.  He doesn't really feel like he has a lot of chest congestion.  He has throat mucus.   He believes that his heart disease is the same.  He sees Dr. Einar Gip.   Past Medical History:  Diagnosis Date  . Anemia   . Atrial fibrillation ([place]) 12/08/2013  . Cataract   . CHF (congestive heart failure) ([place])   . Chronic kidney disease   . Constipation    takes stool softener daily  . Dysrhythmia    a-fib  . GERD (gastroesophageal reflux disease)    takes Zantac daily   . History of blood transfusion    "related to one of my ORs"  . Hyperlipidemia    takes Pravastatin daily  . Hypertension    takes  Cardizem,Proscar,and Lisinopril daily  . Hypothyroidism   . Impaired hearing    wears hearing aids  . Joint pain   . Joint swelling   . Peripheral neuropathy   . Rheumatoid arthritis ([place])    "all over; take orencia  q 4 wks" (02/01/2013)  . Type II diabetes mellitus ([facility])    takes Metformin daily      Review of Systems  Constitutional: Negative for diaphoresis, fatigue and fever.  HENT: Negative for nosebleeds, postnasal drip and rhinorrhea.   Respiratory: Positive for cough and shortness of breath. Negative for chest tightness and stridor.   Cardiovascular: Negative for chest pain, palpitations and leg swelling.       Objective:   Physical Exam Vitals:   07/01/16 1346  BP: 126/62  Pulse: 74  SpO2: 96%  Weight: 213 lb 3.2 oz (96.7 kg)  Height: 5\' 8"  (1.727 m)  RA  Gen: overweight but well appearing HENT: OP clear, TM's clear, neck supple PULM: CTA B, normal percussion CV: RRR, no mgr, trace edema GI: BS+, soft, nontender Derm: no cyanosis or rash Psyche: normal mood and affect   Chest  imaging: Chest x-ray from 2016 images personally reviewed showing mild hyperinflation and questionable scarring in the lower lobes bilaterally September 2017 CT chest findings minimal traction bronchiolectasis in the lower lobes unchanged compared to 2007 no honeycombing, mild patchy subpleural reticulation suggestive of NSIP not consistent with UIP. Some air trapping noted.  PFT PFTs 01/03/14: FeV1 94% FVC 79% Fev1/FVC 84% TLC 86% DLCO 64% October 2017 lung function testing: Ratio normal, FVC 2.99 L 83% predicted, total lung capacity 6.08 L 91% predicted, DLCO 23.40 78% predicted  Micro: 12/2015 AFB: negative 12/2015 Sputum: negative December 2017 sputum culture positive for Fusarium   Echo: November 2017 echocardiogram shows a normal LVEF, left atrial enlargement and grade 2 diastolic dysfunction      Assessment & Plan:  LVH (left ventricular hypertrophy) due to  hypertensive disease He has LVH was diastolic dysfunction but it's not clear to me on exam today that he is volume overloaded as a cause of his dyspnea. I would like for him to follow-up with cardiology to discuss further.  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 We will continue to monitor.  Dyspnea This is worsening. It's not clear to me why because his lung function testing is relatively stable compared to 2 years ago and his CT scan late 2017 did not show severe or worsening lung disease. Further, he does not have signs or symptoms of active bronchiectasis. He is compliant with his medical therapy.  So I wonder about the possibility of worsening anemia or a neuromuscular weakness. Cardiology should probably weigh in as well.  Plan: Check CBC See cardiology Continue Breo Add albuterol to use twice a day given his diagnosis of asthma but I doubt this is very active If no improvement by the next visit consider a neurology evaluation for his reported history of neuromuscular weakness  Chronic obstructive airway disease with asthma ([place]) As above  Bronchiectasis without acute exacerbation (H[facility]) Continue therapy vest An albuterol twice a day Continue 3    Current Outpatient Prescriptions:  .  Abatacept (ORENCIA IV), Inject into the vein every 30 (thirty) days., Disp: , Rfl:  .  albuterol (PROVENTIL) (2.5 MG/3ML) 0.083% nebulizer solution, Take 3 mLs (2.5 mg total) by nebulization every 6 (six) hours as needed for wheezing or shortness of breath., Disp: 360 mL, Rfl: 11 .  atorvastatin (LIPITOR) 20 MG tablet, Take 1 tablet (20 mg total) by mouth daily at 6 PM. (Patient taking differently: Take 40 mg by mouth daily at 6 PM. ), Disp: 30 tablet, Rfl: 0 .  cetirizine (ZYRTEC) 10 MG tablet, Take 10 mg by mouth daily., Disp: , Rfl:  .  ELIQUIS 2.5 MG TABS tablet, Take 2.5 mg by  mouth 2 (two) times daily., Disp: , Rfl: 12 .  ferrous sulfate 325 (65 FE) MG tablet, Take 325 mg by mouth daily with breakfast. , Disp: , Rfl:  .  finasteride (PROSCAR) 5 MG tablet, Take 5 mg by mouth at bedtime. , Disp: , Rfl:  .  fluticasone furoate-vilanterol (BREO ELLIPTA) 100-25 MCG/INH AEPB, Inhale 1 puff into the lungs daily., Disp: 60 each, Rfl: 5 .  folic acid (FOLVITE) 1 MG tablet, Take 1 mg by mouth daily.  , Disp: , Rfl:  .  furosemide (LASIX) 80 MG tablet, Take 80 mg by mouth 2 (two) times daily., Disp: , Rfl:  .  gabapentin (NEURONTIN) 300  MG capsule, Take 1 capsule (300 mg total) by mouth 2 (two) times daily., Disp: 15 capsule, Rfl: 0 .  guaiFENesin (MUCINEX) 600 MG 12 hr tablet, Take by mouth 2 (two) times daily., Disp: , Rfl:  .  HYDROcodone-acetaminophen (NORCO) 5-325 MG per tablet, Take 1 tablet by mouth every 6 (six) hours as needed for moderate pain., Disp: 30 tablet, Rfl: 0 .  Insulin Glargine (TOUJEO SOLOSTAR) 300 UNIT/ML SOPN, Inject 10 Units into the skin at bedtime. , Disp: , Rfl:  .  isosorbide-hydrALAZINE (BIDIL) 20-37.5 MG tablet, Take 1 tablet by mouth 3 (three) times daily., Disp: , Rfl:  .  KLOR-CON M20 20 MEQ tablet, , Disp: , Rfl:  .  leflunomide (ARAVA) 20 MG tablet, , Disp: , Rfl:  .  levothyroxine (SYNTHROID, LEVOTHROID) 25 MCG tablet, Take 25 mcg by mouth daily before breakfast., Disp: , Rfl:  .  metoprolol (LOPRESSOR) 50 MG tablet, Take 25 mg by mouth 3 (three) times daily., Disp: , Rfl:  .  Multiple Vitamins-Minerals (CENTRUM SILVER PO), Take 1 tablet by mouth daily., Disp: , Rfl:  .  potassium chloride (K-DUR) 10 MEQ tablet, Take 10 mEq by mouth daily., Disp: , Rfl:  .  ranitidine (ZANTAC) 150 MG tablet, Take 150 mg by mouth 2 (two) times daily.  , Disp: , Rfl:  .  scopolamine (TRANSDERM-SCOP) 1 MG/3DAYS, Place 1 patch (1.5 mg total) onto the skin once as needed (none in pt > 65. Consult anesthesiologist for pts with psychiatric disorders)., Disp: 10 patch,  Rfl: 12 .  Tamsulosin HCl (FLOMAX) 0.4 MG CAPS, Take 0.4 mg by mouth daily after supper. , Disp: , Rfl:

## 2016-07-01 NOTE — Assessment & Plan Note (Signed)
This is worsening. It's not clear to me why because his lung function testing is relatively stable compared to 2 years ago and his CT scan late 2017 did not show severe or worsening lung disease. Further, he does not have signs or symptoms of active bronchiectasis. He is compliant with his medical therapy.  So I wonder about the possibility of worsening anemia or a neuromuscular weakness. Cardiology should probably weigh in as well.  Plan: Check CBC See cardiology Continue Breo Add albuterol to use twice a day given his diagnosis of asthma but I doubt this is very active If no improvement by the next visit consider a neurology evaluation for his reported history of neuromuscular weakness

## 2016-07-01 NOTE — Assessment & Plan Note (Signed)
This grew from his lung function testing but he has no signs or symptoms on exam to suggest that he's got active disease right now. We will continue to monitor.

## 2016-07-22 DIAGNOSIS — E669 Obesity, unspecified: Secondary | ICD-10-CM | POA: Diagnosis not present

## 2016-07-22 DIAGNOSIS — M0579 Rheumatoid arthritis with rheumatoid factor of multiple sites without organ or systems involvement: Secondary | ICD-10-CM | POA: Diagnosis not present

## 2016-07-22 DIAGNOSIS — H60532 Acute contact otitis externa, left ear: Secondary | ICD-10-CM | POA: Diagnosis not present

## 2016-07-22 DIAGNOSIS — M545 Low back pain: Secondary | ICD-10-CM | POA: Diagnosis not present

## 2016-07-22 DIAGNOSIS — N183 Chronic kidney disease, stage 3 (moderate): Secondary | ICD-10-CM | POA: Diagnosis not present

## 2016-07-22 DIAGNOSIS — Z79899 Other long term (current) drug therapy: Secondary | ICD-10-CM | POA: Diagnosis not present

## 2016-07-22 DIAGNOSIS — M79672 Pain in left foot: Secondary | ICD-10-CM | POA: Diagnosis not present

## 2016-07-22 DIAGNOSIS — Z6832 Body mass index (BMI) 32.0-32.9, adult: Secondary | ICD-10-CM | POA: Diagnosis not present

## 2016-07-22 DIAGNOSIS — M2550 Pain in unspecified joint: Secondary | ICD-10-CM | POA: Diagnosis not present

## 2016-07-22 DIAGNOSIS — M170 Bilateral primary osteoarthritis of knee: Secondary | ICD-10-CM | POA: Diagnosis not present

## 2016-07-25 ENCOUNTER — Telehealth: Payer: Self-pay | Admitting: *Deleted

## 2016-07-25 NOTE — Telephone Encounter (Signed)
PA denied thru Part D Case Ref # [id number] Coverage determination [id number] will take up to 7 days. Patient already has PA on file approved thru 09/17/2016 Nothing further needed at this time.

## 2016-07-25 NOTE — Telephone Encounter (Signed)
Received PA request to renew PA for Albuterol sulfate 0.083%.  PA initiated on cover my meds. [id number]

## 2016-07-28 DIAGNOSIS — M0589 Other rheumatoid arthritis with rheumatoid factor of multiple sites: Secondary | ICD-10-CM | POA: Diagnosis not present

## 2016-08-14 ENCOUNTER — Ambulatory Visit: Payer: Medicare Other | Admitting: Pulmonary Disease

## 2016-08-18 DIAGNOSIS — N184 Chronic kidney disease, stage 4 (severe): Secondary | ICD-10-CM | POA: Diagnosis not present

## 2016-08-18 DIAGNOSIS — N390 Urinary tract infection, site not specified: Secondary | ICD-10-CM | POA: Diagnosis not present

## 2016-08-18 DIAGNOSIS — D631 Anemia in chronic kidney disease: Secondary | ICD-10-CM | POA: Diagnosis not present

## 2016-08-18 DIAGNOSIS — N2581 Secondary hyperparathyroidism of renal origin: Secondary | ICD-10-CM | POA: Diagnosis not present

## 2016-08-19 DIAGNOSIS — N184 Chronic kidney disease, stage 4 (severe): Secondary | ICD-10-CM | POA: Diagnosis not present

## 2016-08-19 DIAGNOSIS — I5022 Chronic systolic (congestive) heart failure: Secondary | ICD-10-CM | POA: Diagnosis not present

## 2016-08-19 DIAGNOSIS — R0989 Other specified symptoms and signs involving the circulatory and respiratory systems: Secondary | ICD-10-CM | POA: Diagnosis not present

## 2016-08-19 DIAGNOSIS — I480 Paroxysmal atrial fibrillation: Secondary | ICD-10-CM | POA: Diagnosis not present

## 2016-08-21 DIAGNOSIS — I10 Essential (primary) hypertension: Secondary | ICD-10-CM | POA: Diagnosis not present

## 2016-08-21 DIAGNOSIS — R0602 Shortness of breath: Secondary | ICD-10-CM | POA: Diagnosis not present

## 2016-08-25 DIAGNOSIS — I10 Essential (primary) hypertension: Secondary | ICD-10-CM | POA: Diagnosis not present

## 2016-08-25 DIAGNOSIS — R0602 Shortness of breath: Secondary | ICD-10-CM | POA: Diagnosis not present

## 2016-08-25 DIAGNOSIS — M0589 Other rheumatoid arthritis with rheumatoid factor of multiple sites: Secondary | ICD-10-CM | POA: Diagnosis not present

## 2016-08-25 DIAGNOSIS — Z79899 Other long term (current) drug therapy: Secondary | ICD-10-CM | POA: Diagnosis not present

## 2016-09-10 DIAGNOSIS — N184 Chronic kidney disease, stage 4 (severe): Secondary | ICD-10-CM | POA: Diagnosis not present

## 2016-09-10 DIAGNOSIS — I480 Paroxysmal atrial fibrillation: Secondary | ICD-10-CM | POA: Diagnosis not present

## 2016-09-10 DIAGNOSIS — E1165 Type 2 diabetes mellitus with hyperglycemia: Secondary | ICD-10-CM | POA: Diagnosis not present

## 2016-09-10 DIAGNOSIS — R0602 Shortness of breath: Secondary | ICD-10-CM | POA: Diagnosis not present

## 2016-09-22 DIAGNOSIS — M0589 Other rheumatoid arthritis with rheumatoid factor of multiple sites: Secondary | ICD-10-CM | POA: Diagnosis not present

## 2016-09-24 DIAGNOSIS — R0602 Shortness of breath: Secondary | ICD-10-CM | POA: Diagnosis not present

## 2016-09-28 NOTE — H&P (Signed)
OFFICE VISIT NOTES COPIED TO EPIC FOR DOCUMENTATION  . History of Present Illness Laverda Page MD; 09/11/2016 5:57 AM) Patient words: Last o/v 08/19/2016; f/u nuc & echo results.  The patient is a 83 year old male who presents for a Follow-up for Dyspnea.  Additional reasons for visit:  Follow-up for Atrial fibrillation is described as the following: Patient with paroxysmal atrial fibrillation, history of rheumatoid arthritis, stage IV chronic kidney disease, hypertension and mild hyperlipidemia presents here for follow-up of dyspnea on exertion. He was found to have lower LVEF with severe LV systolic dysfunction in 7371 and was aggressively treated with medical therapy without angiography due to renal failure but had undergone stress testing which had revealed decreased EF at 44% but no ischemia.  Over the past 4-5 months, due to worsening symptoms of dyspnea, class 3-4, patient unable to do activity of daily living without limitation. He saw Dr. Lake Bells and underwent evaluation that showed stable PFTs and CT scan. It was recommended that he follow-up with [place] for further evaluation. I saw him on 08/19/2016, underwent nuclear stress test and echocardiogram and presents for follow-up. No change in symptoms.  He is been maintaining sinus rhythm and he is oriented correlation with Eliquis without bleeding diathesis. Has chronic mild fatigue. Denies leg edema, PND or orthopnea. No chest pain. States that his diabetes is much improved.   Problem List/Past Medical Laverda Page, MD; 09/11/2016 5:52 AM) Peripheral neuropathy (G62.9)  GERD (gastroesophageal reflux disease) (K21.9)  HOH (hard of hearing), bilateral (H91.93)  wears hearing aids Rheumatoid arthritis (M06.9)  all over- takes injections Lung disease (J98.4)  Paroxysmal atrial fibrillation (I48.0)  CHA2DS2-VASc Score is 4 with yearly risk of stroke of 4.0%. HAS-Bled score is 2 and estimated major bleeding in one year is  1.88-3.2% Uncontrolled type 2 diabetes mellitus without complication, without long-term current use of insulin (E11.65)  Essential hypertension (I10)  CKD (chronic kidney disease), stage 4 (severe) (N18.4)  Hyperlipidemia, mild (E78.5)  Anemia of unknown etiology (D64.9)  LVEF <40% (R09.89)  Echocardiogram (hospital) 12/09/2013 LVEF 35-40%, global hypokinesis, severe LVH, mild MR and TR, RVSP 30 mmHg. NOrmal EF in 2016 Congestive heart failure, NYHA class 3, chronic, systolic (G62.69)  Malaise and fatigue (R53.81, R53.83)  Shortness of breath on exertion (R06.02)  Lexiscan myoview stress test 08/25/2016: 1. The patient had myocardial perfusion imaging performed using a rest Tc-17mstressTc-24m one day protocol. 2. The resting electrocardiogram demonstrated normal sinus rhythm, normal resting conduction, no resting arrhythmias and normal rest repolarization. Stress EKG is non diagnostic for ischemia as it is a pharmacologic stress. However, incomplete left bundle branch block and ventricular bigeminy was noted during stress and recover phase. Stress symptoms included dyspnea, dizziness. 3. Left ventricular cavity is noted to be normal on the rest and stress studies. The left ventricular ejection fraction was calculated to be 34%. This may be due to incomplete left bundle branch block and ventricular bigeminy noted during stress and recover phase.SPECT images demonstrate Medium perfusion abnormality of mild intensity in the basal inferolateral, mid inferior and mid inferolateral myocardial wall(s) on the stress images. The defect partially improves with stress, suggesting soft tissue attenuation. Small area of ischemia cannot be excluded. 4. This is an intermediate risk study. Echocardiogram 08/21/2016: 1. Left ventricle cavity is normal in size. Moderate concentric hypertrophy of the left ventricle. Normal global wall motion. Visual EF is 55-60%. 2. Mild (Grade I) mitral regurgitation. 3. Mild  tricuspid regurgitation. Pulmonary artery systolic pressure is estimated at  30-35 mm Hg. 4. Moderate pulmonic regurgitation.  Allergies (April Garrison; 2016-09-30 11:18 AM) No Known Drug Allergies [12/23/2013]:  Family History (April Louretta Shorten; 2016-09-30 11:18 AM) Mother  Deceased. at age 81 breast cancer Father  Deceased. at age 47 from aneurism of the brain Sister 3  52- 82 years older, 53- 12 years older: No known heart conditions  Social History (April Garrison; September 30, 2016 11:18 AM) Current tobacco use  Former smoker. quit in 1967 at age 76 Non Drinker/No Alcohol Use  Marital status  Married. Number of Children  6. Living Situation  Lives with spouse.  Past Surgical History (April Garrison; September 30, 2016 11:18 AM) TONSILLECTOMY [1930]: REPAIR TENDONS FOOT [1946]: Left. 66 - due to farming accident SHOULDER ARTHROSCOPY W/ ROTATOR CUFF REPAIR [1990]: Right. CATARACT EXTRACTION W/ INTRAOCULAR LENS IMPLANT, BILATERAL [1990]: Bilateral. CARPAL TUNNEL RELEASE [1990]: Bilateral. ANKLE FUSION [2004]: Left. TOTAL SHOULDER REPLACEMENT [03/2010]: Left. TOTAL KNEE ARTHROPLASTY [12/2010]: Right. THYROIDECTOMY, PARTIAL [02/01/2013]: Right.  Medication History Laverda Page, MD; Sep 30, 2016 11:52 AM) BiDil (20-37.5MG Tablet, 1 Tablet Tablet Oral three times daily, Taken starting 06/24/2016) Active. Metoprolol Tartrate (50MG Tablet, 1 Oral two times daily, Taken starting 02/06/2014) Active. (02/06/2014: increased to 63m bid, d/c amiodarone) Eliquis (2.5MG Tablet, 1 (one) Tablet Tablet Tablet T Oral two times daily, Taken starting 12/29/2013) Active. (discontinue Xarelto) Hydrocodone-Acetaminophen (5-325MG Tablet, 1 Oral as needed) Active. Albuterol Sulfate ((2.5 MG/3ML)0.083% Nebulized Soln, 1 Inhalation as needed) Active. Atorvastatin Calcium (40MG Tablet, 1 Oral in the evening) Active. Furosemide (80MG Tablet, 1 Oral two times daily) Active. Temazepam (15MG Capsule, 1  Oral at bedtime) Active. Finasteride (5MG Tablet, 1 Oral daily) Active. Montelukast Sodium (10MG Tablet, 1 Oral at bedtime) Active. Folic Acid (1MG Tablet, 1 Oral daily) Active. Tamsulosin HCl (0.4MG Capsule, 1 Oral two times daily) Active. Orencia (250MG For Solution, 1 Intravenous once a month) Active. Gabapentin (Once-Daily) (300MG Tablet, 1 Oral two times daily) Active. Cetirizine HCl (10MG Tablet, 1 Oral daily) Active. RaNITidine HCl (150MG Capsule, 1 Oral two times daily) Active. Toujeo SoloStar (300UNIT/ML Soln Pen-inj, 15 Units Subcutaneous at night) Active. Leflunomide (20MG Tablet, 1 Oral daily) Active. Breo Ellipta (100-25MCG/INH Aero Pow Br Act, Inhalation daily) Active. Mucinex (600MG Tablet ER, 1 Oral two times daily) Active. Medications Reconciled (verbally with pt/ list present; Fill Hx verified)  Diagnostic Studies History (Laverda Page MD; 8Sep 18, 201811:45 AM) Echocardiogram [08/21/2016]: 1. Left ventricle cavity is normal in size. Moderate concentric hypertrophy of the left ventricle. Normal global wall motion. Visual EF is 55-60%. 2. Mild (Grade I) mitral regurgitation. 3. Mild tricuspid regurgitation. Pulmonary artery systolic pressure is estimated at 30-35 mm Hg. 4. Moderate pulmonic regurgitation. Nuclear stress test [08/25/2016]: 1. The patient had myocardial perfusion imaging performed using a rest Tc-21mtressTc-21m one day protocol. 2. The resting electrocardiogram demonstrated normal sinus rhythm, normal resting conduction, no resting arrhythmias and normal rest repolarization. Stress EKG is non diagnostic for ischemia as it is a pharmacologic stress. However, incomplete left bundle branch block and ventricular bigeminy was noted during stress and recover phase. Stress symptoms included dyspnea, dizziness. 3. Left ventricular cavity is noted to be normal on the rest and stress studies. The left ventricular ejection fraction was calculated to be  34%. This may be due to incomplete left bundle branch block and ventricular bigeminy noted during stress and recover phase.SPECT images demonstrate Medium perfusion abnormality of mild intensity in the basal inferolateral, mid inferior and mid inferolateral myocardial wall(s) on the stress images. The defect partially improves with stress, suggesting soft  tissue attenuation. Small area of ischemia cannot be excluded. 4. This is an intermediate risk study.    Review of Systems Laverda Page MD; 09/11/2016 5:58 AM) General Present- Fatigue. Not Present- Anorexia, Feeling well and Fever. Respiratory Present- Dyspnea (recently worsened). Not Present- Cough and Decreased Exercise Tolerance. Cardiovascular Present- Difficulty Breathing On Exertion. Not Present- Chest Pain, Claudications, Edema, Orthopnea, Palpitations, Paroxysmal Nocturnal Dyspnea and Shortness of Breath. Gastrointestinal Not Present- Change in Bowel Habits, Constipation and Nausea. Musculoskeletal Present- Backache and Joint Pain (diffuse DJD). Neurological Not Present- Focal Neurological Symptoms. Endocrine Not Present- Appetite Changes, Cold Intolerance and Heat Intolerance. Hematology Not Present- Anemia, Petechiae and Prolonged Bleeding. All other systems negative  Vitals (April Garrison; 09/10/2016 11:29 AM) 09/10/2016 11:20 AM Weight: 222.13 lb Height: 68in Body Surface Area: 2.14 m Body Mass Index: 33.77 kg/m  Pulse: 74 (Regular)  P.OX: 97% (Room air) BP: 118/72 (Sitting, Left Arm, Standard)       Physical Exam Laverda Page MD; 09/11/2016 5:58 AM) General Mental Status-Alert. General Appearance-Cooperative, Appears younger than stated age, Not in acute distress. Orientation-Oriented X3. Build & Nutrition-Moderately built and Mildly obese.  Head and Neck Thyroid Gland Characteristics - no palpable nodules, no palpable enlargement.  Chest and Lung Exam Chest and lung exam reveals  -quiet, even and easy respiratory effort with no use of accessory muscles and on auscultation, normal breath sounds, no adventitious sounds.  Cardiovascular Cardiovascular examination reveals -normal heart sounds, regular rate and rhythm with no murmurs, carotid auscultation reveals no bruits, abdominal aorta auscultation reveals no bruits and no prominent pulsation, femoral artery auscultation bilaterally reveals normal pulses, no bruits, no thrills, normal pedal pulses bilaterally and no digital clubbing, cyanosis, edema, increased warmth or tenderness.  Abdomen Palpation/Percussion Normal exam - Non Tender and No hepatosplenomegaly. Auscultation Normal exam - Bowel sounds normal.  Neurologic Motor-Grossly intact without any focal deficits.  Musculoskeletal Global Assessment Left Lower Extremity - normal range of motion without pain. Right Lower Extremity - normal range of motion without pain.    Assessment & Plan Laverda Page MD; 09/11/2016 6:10 AM) Shortness of breath on exertion (R06.02) Story: Lexiscan myoview stress test 08/25/2016: 1. The patient had myocardial perfusion imaging performed using a rest Tc-53mstressTc-20m one day protocol. 2. The resting electrocardiogram demonstrated normal sinus rhythm, normal resting conduction, no resting arrhythmias and normal rest repolarization. Stress EKG is non diagnostic for ischemia as it is a pharmacologic stress. However, incomplete left bundle branch block and ventricular bigeminy was noted during stress and recover phase. Stress symptoms included dyspnea, dizziness. 3. Left ventricular cavity is noted to be normal on the rest and stress studies. The left ventricular ejection fraction was calculated to be 34%. This may be due to incomplete left bundle branch block and ventricular bigeminy noted during stress and recover phase.SPECT images demonstrate Medium perfusion abnormality of mild intensity in the basal inferolateral,  mid inferior and mid inferolateral myocardial wall(s) on the stress images. The defect partially improves with stress, suggesting soft tissue attenuation. Small area of ischemia cannot be excluded. 4. This is an intermediate risk study.  Echocardiogram 08/21/2016: 1. Left ventricle cavity is normal in size. Moderate concentric hypertrophy of the left ventricle. Normal global wall motion. Visual EF is 55-60%. 2. Mild (Grade I) mitral regurgitation. 3. Mild tricuspid regurgitation. Pulmonary artery systolic pressure is estimated at 30-35 mm Hg. 4. Moderate pulmonic regurgitation. Future Plans 91/61/0960 METABOLIC PANEL, BASIC (845409 - one time 09/22/2016: CBC & PLATELETS (AUTO) ((81191 - one time  09/22/2016: PT (PROTHROMBIN TIME) (16109) - one time Paroxysmal atrial fibrillation (I48.0) Story: CHA2DS2-VASc Score is 4 with yearly risk of stroke of 4.0%. HAS-Bled score is 2 and estimated major bleeding in one year is 1.88-3.2% Impression: EKG 08/19/2016: Sinus rhythm at a rate of 69 bpm, normal axis, left atrial enlargement. Nonspecific ST and T wave abnormality in inferior leads. Normal QT interval. No significant change from EKG on 08/22/2015. CKD (chronic kidney disease), stage 4 (severe) (N18.4) Uncontrolled type 2 diabetes mellitus without complication, without long-term current use of insulin (E11.65) Malaise and fatigue (R53.81) Essential hypertension (I10) Labwork Story: 09/25/2016: Glucose 152, albumin 34, creatinine 2.34, EGFR 25/29, potassium 3.9, BMP otherwise normal.  RBC 3.9, hemoglobin 9.8, hematocrit 33.9, CBC otherwise normal.  INR 1.0.  Prothrombin time 10.3.  05/15/2016: Creatinine 2.2, EGFR 27, potassium 4.0, CMP otherwise normal. RBC 3.89, hemoglobin 11.5, hematocrit 33.0, CBC otherwise normal. TIBC 246, otherwise iron studies normal.  01/21/2016: Cholesterol 152, Triglycerides 94, HDL 35, LDL 98. HgbA1c 7.2%. Creatinine 2.09, Potassium 3.8, eGFR 28, CMP otherwise normal. RBC 3.84,  Hgb 11.7, Hct 34.8, CBC otherwise normal  10/10/2015: Glucose 189, BUN 36, creatinine 2.06, eGFR 29, potassium 3.5, CMP otherwise normal  05/24/2015: Glucose 247, BUN 29, creatinine 2.07, eGFR 33, potassium 4.2, CMP normal, CBC normal  02/01/2015: Creatinine 2.03, eGFR 30, potassium 3.9, CMP otherwise normal, CBC normal, vitamin D 32.6  08/22/2014: HB 11.5/HCT 34.3 with normocytic indices  07/12/2014: Serum glucose 197, BUN 33, creatinine 2.09, eGFR 29, potassium 4.3, CBC normal  12/29/2013 HB 12.6/HCT 35.9. Normal indicis. BUN 29, serum creatinine 2.08, eGFR 29 mL. CMP otherwise was within normal limits.  12/26/2013: serum glucose 164, BUN 37, creatinine 2.27, eGFR 26, CrCl 23.42, potassium 3.8, hepatic function normal, HB 12.3/HCT 35.5 with normocytic indices  12/12/2013: BUN 48, creatinine 2.33, eGFR 25, potassium 3.9, proBNP 3116, HB 11.2/HCT 33.0, with normocytic indices, total cholesterol 142, triglycerides 144, HDL 50, LDL 63, hepatic function normal  Note:Recommendations:  Patient presenting for evaluation of worsening dyspnea on exertion, daughter is present at the bedside. Patient has severely limited activity and states that his lifestyle is affected by inability to perform any activity of daily living without limitations. I have discussed findings with echocardiogram and nuclear stress test and would recommend coronary angiography due to persistent symptoms. Balanced ischemia cannot be excluded in view of reduced LVEF. She will also need right heart catheterization.  I have specifically discussed with the patient regarding renal failure. We will try to reduce contrast exposure to the minimum and staged procedure also explained in detail. He will hold his Eliquis a day prior to procedure. Maintains sinus rhythm. His labs were reviewed including diabetes which is improved now. Blood pressure is well controlled. Hence no changes in the medications were done today. Office visit  after heart catheterization. Patient's daughter present at the bedside and all questions answered.  CC: Dr. [name]  Signed by Laverda Page, MD (09/11/2016 6:11 AM)

## 2016-09-30 ENCOUNTER — Observation Stay (HOSPITAL_COMMUNITY)
Admission: RE | Admit: 2016-09-30 | Discharge: 2016-10-01 | Disposition: A | Discharge status: Home / Self Care | Payer: Medicare Other | Source: Ambulatory Visit | Attending: Cardiology | Admitting: Cardiology

## 2016-09-30 ENCOUNTER — Encounter (HOSPITAL_COMMUNITY)
Admission: RE | Disposition: A | Discharge status: Home / Self Care | Payer: Self-pay | Source: Ambulatory Visit | Attending: Cardiology

## 2016-09-30 DIAGNOSIS — I25708 Atherosclerosis of coronary artery bypass graft(s), unspecified, with other forms of angina pectoris: Secondary | ICD-10-CM | POA: Diagnosis present

## 2016-09-30 DIAGNOSIS — E785 Hyperlipidemia, unspecified: Secondary | ICD-10-CM | POA: Insufficient documentation

## 2016-09-30 DIAGNOSIS — M069 Rheumatoid arthritis, unspecified: Secondary | ICD-10-CM | POA: Diagnosis not present

## 2016-09-30 DIAGNOSIS — Z794 Long term (current) use of insulin: Secondary | ICD-10-CM | POA: Diagnosis not present

## 2016-09-30 DIAGNOSIS — E039 Hypothyroidism, unspecified: Secondary | ICD-10-CM | POA: Diagnosis not present

## 2016-09-30 DIAGNOSIS — H9190 Unspecified hearing loss, unspecified ear: Secondary | ICD-10-CM | POA: Insufficient documentation

## 2016-09-30 DIAGNOSIS — I081 Rheumatic disorders of both mitral and tricuspid valves: Secondary | ICD-10-CM | POA: Diagnosis not present

## 2016-09-30 DIAGNOSIS — J449 Chronic obstructive pulmonary disease, unspecified: Secondary | ICD-10-CM | POA: Diagnosis not present

## 2016-09-30 DIAGNOSIS — I25119 Atherosclerotic heart disease of native coronary artery with unspecified angina pectoris: Secondary | ICD-10-CM | POA: Diagnosis present

## 2016-09-30 DIAGNOSIS — I2584 Coronary atherosclerosis due to calcified coronary lesion: Secondary | ICD-10-CM | POA: Insufficient documentation

## 2016-09-30 DIAGNOSIS — K219 Gastro-esophageal reflux disease without esophagitis: Secondary | ICD-10-CM | POA: Diagnosis not present

## 2016-09-30 DIAGNOSIS — R9439 Abnormal result of other cardiovascular function study: Secondary | ICD-10-CM | POA: Diagnosis not present

## 2016-09-30 DIAGNOSIS — I2510 Atherosclerotic heart disease of native coronary artery without angina pectoris: Secondary | ICD-10-CM | POA: Diagnosis not present

## 2016-09-30 DIAGNOSIS — D631 Anemia in chronic kidney disease: Secondary | ICD-10-CM | POA: Insufficient documentation

## 2016-09-30 DIAGNOSIS — I272 Pulmonary hypertension, unspecified: Secondary | ICD-10-CM | POA: Insufficient documentation

## 2016-09-30 DIAGNOSIS — Z7901 Long term (current) use of anticoagulants: Secondary | ICD-10-CM | POA: Diagnosis not present

## 2016-09-30 DIAGNOSIS — I447 Left bundle-branch block, unspecified: Secondary | ICD-10-CM | POA: Insufficient documentation

## 2016-09-30 DIAGNOSIS — I480 Paroxysmal atrial fibrillation: Secondary | ICD-10-CM | POA: Insufficient documentation

## 2016-09-30 DIAGNOSIS — E1122 Type 2 diabetes mellitus with diabetic chronic kidney disease: Secondary | ICD-10-CM | POA: Insufficient documentation

## 2016-09-30 DIAGNOSIS — I5032 Chronic diastolic (congestive) heart failure: Secondary | ICD-10-CM | POA: Insufficient documentation

## 2016-09-30 DIAGNOSIS — E1142 Type 2 diabetes mellitus with diabetic polyneuropathy: Secondary | ICD-10-CM | POA: Insufficient documentation

## 2016-09-30 DIAGNOSIS — Z87891 Personal history of nicotine dependence: Secondary | ICD-10-CM | POA: Diagnosis not present

## 2016-09-30 DIAGNOSIS — I130 Hypertensive heart and chronic kidney disease with heart failure and stage 1 through stage 4 chronic kidney disease, or unspecified chronic kidney disease: Secondary | ICD-10-CM | POA: Diagnosis not present

## 2016-09-30 DIAGNOSIS — R0609 Other forms of dyspnea: Secondary | ICD-10-CM | POA: Diagnosis present

## 2016-09-30 DIAGNOSIS — N184 Chronic kidney disease, stage 4 (severe): Secondary | ICD-10-CM | POA: Diagnosis not present

## 2016-09-30 DIAGNOSIS — R0602 Shortness of breath: Secondary | ICD-10-CM | POA: Diagnosis not present

## 2016-09-30 DIAGNOSIS — R0600 Dyspnea, unspecified: Secondary | ICD-10-CM

## 2016-09-30 HISTORY — PX: CARDIAC CATHETERIZATION: SHX172

## 2016-09-30 HISTORY — DX: Unspecified asthma, uncomplicated: J45.909

## 2016-09-30 HISTORY — DX: Other chronic pain: G89.29

## 2016-09-30 HISTORY — DX: Atherosclerotic heart disease of native coronary artery without angina pectoris: I25.10

## 2016-09-30 HISTORY — DX: Chronic obstructive pulmonary disease, unspecified: J44.9

## 2016-09-30 HISTORY — DX: Chronic kidney disease, stage 3 (moderate): N18.3

## 2016-09-30 HISTORY — PX: RIGHT/LEFT HEART CATH AND CORONARY ANGIOGRAPHY: CATH118266

## 2016-09-30 HISTORY — DX: Chronic kidney disease, stage 3 unspecified: N18.30

## 2016-09-30 HISTORY — DX: Atherosclerotic heart disease of native coronary artery with unspecified angina pectoris: I25.119

## 2016-09-30 HISTORY — DX: Pain in unspecified joint: M25.50

## 2016-09-30 LAB — POCT ACTIVATED CLOTTING TIME
ACTIVATED CLOTTING TIME: 136 s
Activated Clotting Time: 136 seconds

## 2016-09-30 LAB — GLUCOSE, CAPILLARY
GLUCOSE-CAPILLARY: 216 mg/dL — AB (ref 65–99)
Glucose-Capillary: 186 mg/dL — ABNORMAL HIGH (ref 65–99)
Glucose-Capillary: 170 mg/dL — ABNORMAL HIGH (ref 65–99)
Glucose-Capillary: 181 mg/dL — ABNORMAL HIGH (ref 65–99)
Glucose-Capillary: 142 mg/dL — ABNORMAL HIGH (ref 65–99)

## 2016-09-30 SURGERY — RIGHT/LEFT HEART CATH AND CORONARY ANGIOGRAPHY
Anesthesia: LOCAL

## 2016-09-30 MED ORDER — SODIUM CHLORIDE 0.9 % IV SOLN: 250.0000 mL | INTRAVENOUS | Status: DC | PRN

## 2016-09-30 MED ORDER — FOLIC ACID 1 MG PO TABS
1.0000 mg | ORAL_TABLET | Freq: Every day | ORAL | Status: DC
  Administered 2016-10-01: 1 mg via ORAL
  Filled 2016-09-30: qty 1

## 2016-09-30 MED ORDER — HEPARIN (PORCINE) IN NACL 2-0.9 UNIT/ML-% IJ SOLN
INTRAMUSCULAR | Status: AC
  Filled 2016-09-30: qty 1000

## 2016-09-30 MED ORDER — HEPARIN SODIUM (PORCINE) 1000 UNIT/ML IJ SOLN: INTRAMUSCULAR | Status: DC | PRN

## 2016-09-30 MED ORDER — ASPIRIN 81 MG PO CHEW
CHEWABLE_TABLET | ORAL | Status: AC
  Administered 2016-09-30: 81 mg via ORAL
  Filled 2016-09-30: qty 1

## 2016-09-30 MED ORDER — MIDAZOLAM HCL 2 MG/2ML IJ SOLN
INTRAMUSCULAR | Status: AC
  Filled 2016-09-30: qty 2

## 2016-09-30 MED ORDER — SODIUM CHLORIDE 0.9 % IV SOLN: INTRAVENOUS | Status: AC

## 2016-09-30 MED ORDER — SODIUM CHLORIDE 0.9% FLUSH: 3.0000 mL | INTRAVENOUS | Status: DC | PRN

## 2016-09-30 MED ORDER — LEVOTHYROXINE SODIUM 25 MCG PO TABS
25.0000 ug | ORAL_TABLET | Freq: Every day | ORAL | Status: DC
  Administered 2016-10-01: 07:00:00 25 ug via ORAL
  Filled 2016-09-30: qty 1

## 2016-09-30 MED ORDER — IOPAMIDOL (ISOVUE-370) INJECTION 76%
INTRAVENOUS | Status: AC
  Filled 2016-09-30: qty 100

## 2016-09-30 MED ORDER — HEPARIN (PORCINE) IN NACL 2-0.9 UNIT/ML-% IJ SOLN
INTRAMUSCULAR | Status: AC | PRN
  Administered 2016-09-30: 1000 mL

## 2016-09-30 MED ORDER — FLUTICASONE FUROATE-VILANTEROL 100-25 MCG/INH IN AEPB
1.0000 | INHALATION_SPRAY | Freq: Every day | RESPIRATORY_TRACT | Status: DC
  Filled 2016-09-30: qty 28

## 2016-09-30 MED ORDER — ATORVASTATIN CALCIUM 40 MG PO TABS
40.0000 mg | ORAL_TABLET | Freq: Every day | ORAL | Status: DC
  Administered 2016-09-30 – 2016-10-01 (×2): 40 mg via ORAL
  Filled 2016-09-30 (×2): qty 1

## 2016-09-30 MED ORDER — LIDOCAINE HCL (PF) 1 % IJ SOLN
INTRAMUSCULAR | Status: DC | PRN
  Administered 2016-09-30: 18:00:00 via INTRA_ARTERIAL

## 2016-09-30 MED ORDER — SODIUM CHLORIDE 0.9 % WEIGHT BASED INFUSION: 1.0000 mL/kg/h | INTRAVENOUS | Status: DC

## 2016-09-30 MED ORDER — ALBUTEROL SULFATE (2.5 MG/3ML) 0.083% IN NEBU: 2.5000 mg | INHALATION_SOLUTION | Freq: Four times a day (QID) | RESPIRATORY_TRACT | Status: DC | PRN

## 2016-09-30 MED ORDER — FINASTERIDE 5 MG PO TABS
5.0000 mg | ORAL_TABLET | Freq: Every day | ORAL | Status: DC
  Administered 2016-09-30: 5 mg via ORAL
  Filled 2016-09-30: qty 1

## 2016-09-30 MED ORDER — INSULIN GLARGINE 100 UNIT/ML ~~LOC~~ SOLN
10.0000 [IU] | Freq: Every day | SUBCUTANEOUS | Status: DC
  Administered 2016-09-30: 22:00:00 10 [IU] via SUBCUTANEOUS
  Filled 2016-09-30 (×2): qty 0.1

## 2016-09-30 MED ORDER — LIDOCAINE HCL 2 % IJ SOLN
INTRAMUSCULAR | Status: AC
  Filled 2016-09-30: qty 10

## 2016-09-30 MED ORDER — ONDANSETRON HCL 4 MG/2ML IJ SOLN: 4.0000 mg | Freq: Four times a day (QID) | INTRAMUSCULAR | Status: DC | PRN

## 2016-09-30 MED ORDER — GUAIFENESIN ER 600 MG PO TB12
1200.0000 mg | ORAL_TABLET | Freq: Two times a day (BID) | ORAL | Status: DC | PRN
  Administered 2016-09-30: 1200 mg via ORAL
  Filled 2016-09-30: qty 2

## 2016-09-30 MED ORDER — SODIUM CHLORIDE 0.9 % WEIGHT BASED INFUSION
3.0000 mL/kg/h | INTRAVENOUS | Status: AC
  Administered 2016-09-30: 3 mL/kg/h via INTRAVENOUS

## 2016-09-30 MED ORDER — TRAZODONE HCL 50 MG PO TABS
50.0000 mg | ORAL_TABLET | Freq: Every evening | ORAL | Status: DC | PRN
  Administered 2016-09-30: 50 mg via ORAL
  Filled 2016-09-30: qty 1

## 2016-09-30 MED ORDER — TAMSULOSIN HCL 0.4 MG PO CAPS
0.4000 mg | ORAL_CAPSULE | Freq: Every day | ORAL | Status: DC
  Administered 2016-09-30: 0.4 mg via ORAL
  Filled 2016-09-30: qty 1

## 2016-09-30 MED ORDER — HEPARIN SODIUM (PORCINE) 1000 UNIT/ML IJ SOLN
INTRAMUSCULAR | Status: AC
  Filled 2016-09-30: qty 1

## 2016-09-30 MED ORDER — MIDAZOLAM HCL 2 MG/2ML IJ SOLN
INTRAMUSCULAR | Status: DC | PRN
  Administered 2016-09-30: 1 mg via INTRAVENOUS

## 2016-09-30 MED ORDER — LEFLUNOMIDE 20 MG PO TABS
20.0000 mg | ORAL_TABLET | Freq: Every day | ORAL | Status: DC
  Administered 2016-10-01: 12:00:00 20 mg via ORAL
  Filled 2016-09-30: qty 1

## 2016-09-30 MED ORDER — INSULIN ASPART 100 UNIT/ML ~~LOC~~ SOLN
0.0000 [IU] | Freq: Three times a day (TID) | SUBCUTANEOUS | Status: DC
  Administered 2016-10-01: 4 [IU] via SUBCUTANEOUS

## 2016-09-30 MED ORDER — METOPROLOL TARTRATE 25 MG PO TABS
25.0000 mg | ORAL_TABLET | Freq: Every day | ORAL | Status: DC
Start: 2016-09-30 — End: 2016-10-01
  Administered 2016-09-30 – 2016-10-01 (×2): 25 mg via ORAL
  Filled 2016-09-30 (×2): qty 1

## 2016-09-30 MED ORDER — GABAPENTIN 300 MG PO CAPS
300.0000 mg | ORAL_CAPSULE | Freq: Two times a day (BID) | ORAL | Status: DC
  Administered 2016-09-30 – 2016-10-01 (×2): 300 mg via ORAL
  Filled 2016-09-30 (×2): qty 1

## 2016-09-30 MED ORDER — ASPIRIN 81 MG PO CHEW
81.0000 mg | CHEWABLE_TABLET | ORAL | Status: AC
  Administered 2016-09-30: 81 mg via ORAL

## 2016-09-30 MED ORDER — ISOSORB DINITRATE-HYDRALAZINE 20-37.5 MG PO TABS
1.0000 | ORAL_TABLET | Freq: Three times a day (TID) | ORAL | Status: DC
  Administered 2016-09-30 – 2016-10-01 (×2): 1 via ORAL
  Filled 2016-09-30 (×3): qty 1

## 2016-09-30 MED ORDER — HYDROCODONE-ACETAMINOPHEN 5-325 MG PO TABS
1.0000 | ORAL_TABLET | Freq: Four times a day (QID) | ORAL | Status: DC | PRN
Start: 2016-09-30 — End: 2016-10-01

## 2016-09-30 MED ORDER — NITROGLYCERIN 1 MG/10 ML FOR IR/CATH LAB
INTRA_ARTERIAL | Status: AC
  Filled 2016-09-30: qty 10

## 2016-09-30 MED ORDER — FENTANYL CITRATE (PF) 100 MCG/2ML IJ SOLN
INTRAMUSCULAR | Status: AC
  Filled 2016-09-30: qty 2

## 2016-09-30 MED ORDER — IOPAMIDOL (ISOVUE-370) INJECTION 76%
INTRAVENOUS | Status: DC | PRN
  Administered 2016-09-30: 60 mL via INTRA_ARTERIAL

## 2016-09-30 MED ORDER — SODIUM BICARBONATE 8.4 % IV SOLN
INTRAVENOUS | Status: DC
  Filled 2016-09-30: qty 1000

## 2016-09-30 MED ORDER — ACETAMINOPHEN 325 MG PO TABS: 650.0000 mg | ORAL_TABLET | ORAL | Status: DC | PRN

## 2016-09-30 MED ORDER — SODIUM CHLORIDE 0.9% FLUSH
3.0000 mL | Freq: Two times a day (BID) | INTRAVENOUS | Status: DC
  Administered 2016-09-30: 3 mL via INTRAVENOUS

## 2016-09-30 MED ORDER — VERAPAMIL HCL 2.5 MG/ML IV SOLN
INTRAVENOUS | Status: AC
  Filled 2016-09-30: qty 2

## 2016-09-30 MED ORDER — SODIUM CHLORIDE 0.9% FLUSH: 3.0000 mL | Freq: Two times a day (BID) | INTRAVENOUS | Status: DC

## 2016-09-30 MED ORDER — FENTANYL CITRATE (PF) 100 MCG/2ML IJ SOLN
INTRAMUSCULAR | Status: DC | PRN
  Administered 2016-09-30: 25 ug via INTRAVENOUS

## 2016-09-30 MED ORDER — SODIUM BICARBONATE BOLUS VIA INFUSION
INTRAVENOUS | Status: AC
  Administered 2016-09-30: 150 meq via INTRAVENOUS
  Filled 2016-09-30: qty 1

## 2016-09-30 MED ORDER — LIDOCAINE HCL (PF) 1 % IJ SOLN
INTRAMUSCULAR | Status: DC | PRN
  Administered 2016-09-30: 2 mL
  Administered 2016-09-30: 15 mL
  Administered 2016-09-30: 2 mL

## 2016-09-30 SURGICAL SUPPLY — 24 items
CATH BALLN WEDGE 5F 110CM (CATHETERS) ×1 IMPLANT
CATH INFINITI 5FR AL1 (CATHETERS) ×1 IMPLANT
CATH INFINITI 5FR JL4 (CATHETERS) ×1 IMPLANT
CATH INFINITI JR4 5F (CATHETERS) ×1 IMPLANT
CATH LAUNCHER 5F 3DRIGHT (CATHETERS) IMPLANT
CATH LAUNCHER 5F AR1 (CATHETERS) ×1 IMPLANT
CATH LAUNCHER 5F NOTO (CATHETERS) IMPLANT
CATHETER LAUNCHER 5F 3DRIGHT (CATHETERS) ×2
CATHETER LAUNCHER 5F NOTO (CATHETERS) ×2
COVER PRB 48X5XTLSCP FOLD TPE (BAG) IMPLANT
COVER PROBE 5X48 (BAG)
DEVICE RAD COMP TR BAND LRG (VASCULAR PRODUCTS) ×1 IMPLANT
GLIDESHEATH SLEND A-KIT 6F 22G (SHEATH) ×1 IMPLANT
GUIDEWIRE ANGLED .035X150CM (WIRE) ×1 IMPLANT
GUIDEWIRE INQWIRE 1.5J.035X260 (WIRE) IMPLANT
INQWIRE 1.5J .035X260CM (WIRE) ×2
KIT HEART LEFT (KITS) ×2 IMPLANT
PACK CARDIAC CATHETERIZATION (CUSTOM PROCEDURE TRAY) ×2 IMPLANT
SHEATH GLIDE SLENDER 4/5FR (SHEATH) ×1 IMPLANT
SHEATH PINNACLE 5F 10CM (SHEATH) ×1 IMPLANT
TRANSDUCER W/STOPCOCK (MISCELLANEOUS) ×2 IMPLANT
TUBING CIL FLEX 10 FLL-RA (TUBING) ×2 IMPLANT
WIRE EMERALD 3MM-J .025X260CM (WIRE) ×1 IMPLANT
WIRE EMERALD 3MM-J .035X150CM (WIRE) ×1 IMPLANT

## 2016-09-30 NOTE — Progress Notes (Signed)
Spoke with Dr. Irven Shelling partner, informed him that pt took Eliquis at 0800 on 09/29/16. Okay for pt to proceed with bicarb infusion and heart catheterization per Dr. Irven Shelling partner.

## 2016-09-30 NOTE — Interval H&P Note (Signed)
History and Physical Interval Note:  09/30/2016 5:49 PM  [name]  has presented today for surgery, with the diagnosis of abnormal stress - shortness of breath  The various methods of treatment have been discussed with the patient and family. After consideration of risks, benefits and other options for treatment, the patient has consented to  Procedure(s): RIGHT/LEFT HEART CATH AND CORONARY ANGIOGRAPHY (N/A) as a surgical intervention .  The patient's history has been reviewed, patient examined, no change in status, stable for surgery.  I have reviewed the patient's chart and labs.  Questions were answered to the patient's satisfaction.    1 Vessel Disease PCI CABG   No proximal LAD involvement, No proximal left dominant LCX involvement A (8); Indication 2 M (6); Indication 2   Proximal left dominant LCX involvement A (8); Indication 5 A (8); Indication 5   Proximal LAD involvement A (8); Indication 5 A (8); Indication 5   newline 2 Vessel Disease  No proximal LAD involvement A (8); Indication 8 A (7); Indication 8   Proximal LAD involvement A (8); Indication 14 A (9); Indication 14   newline 3 Vessel Disease  Low disease complexity (e.g., focal stenoses, SYNTAX <=22) A (7); Indication 19 A (9); Indication 19   Intermediate or high disease complexity (e.g., SYNTAX >=23) M (6); Indication 23 A (9); Indication 23   newline Left Main Disease  Isolated LMCA disease: ostial or midshaft A (7); Indication 24 A (9); Indication 24   Isolated LMCA disease: bifurcation involvement M (6); Indication 25 A (9); Indication 25   LMCA ostial or midshaft, concurrent low disease burden multivessel disease (e.g., 1-2 additional focal stenoses, SYNTAX <=22) A (7); Indication 26 A (9); Indication 26   LMCA ostial or midshaft, concurrent intermediate or high disease burden multivessel disease (e.g., 1-2 additional bifurcation stenoses, long stenoses, SYNTAX >=23) M (4); Indication 27 A (9); Indication 27   LMCA  bifurcation involvement, concurrent low disease burden multivessel disease (e.g., 1-2 additional focal stenoses, SYNTAX <=22) M (6); Indication 28 A (9); Indication 28   LMCA bifurcation involvement, concurrent intermediate or high disease burden multivessel disease (e.g., 1-2 additional bifurcation stenoses, long stenoses, SYNTAX >=23) R (3); Indication 29 A (9); Indication [place]

## 2016-10-01 ENCOUNTER — Observation Stay (HOSPITAL_COMMUNITY): Payer: Medicare Other

## 2016-10-01 ENCOUNTER — Encounter (HOSPITAL_COMMUNITY): Payer: Self-pay | Admitting: Thoracic Surgery (Cardiothoracic Vascular Surgery)

## 2016-10-01 ENCOUNTER — Observation Stay (HOSPITAL_BASED_OUTPATIENT_CLINIC_OR_DEPARTMENT_OTHER): Payer: Medicare Other

## 2016-10-01 ENCOUNTER — Other Ambulatory Visit: Payer: Self-pay | Admitting: *Deleted

## 2016-10-01 DIAGNOSIS — I371 Nonrheumatic pulmonary valve insufficiency: Secondary | ICD-10-CM

## 2016-10-01 DIAGNOSIS — I130 Hypertensive heart and chronic kidney disease with heart failure and stage 1 through stage 4 chronic kidney disease, or unspecified chronic kidney disease: Secondary | ICD-10-CM | POA: Diagnosis not present

## 2016-10-01 DIAGNOSIS — I272 Pulmonary hypertension, unspecified: Secondary | ICD-10-CM | POA: Diagnosis not present

## 2016-10-01 DIAGNOSIS — M069 Rheumatoid arthritis, unspecified: Secondary | ICD-10-CM | POA: Diagnosis not present

## 2016-10-01 DIAGNOSIS — I480 Paroxysmal atrial fibrillation: Secondary | ICD-10-CM | POA: Diagnosis not present

## 2016-10-01 DIAGNOSIS — N184 Chronic kidney disease, stage 4 (severe): Secondary | ICD-10-CM | POA: Diagnosis not present

## 2016-10-01 DIAGNOSIS — I447 Left bundle-branch block, unspecified: Secondary | ICD-10-CM | POA: Diagnosis not present

## 2016-10-01 DIAGNOSIS — I2584 Coronary atherosclerosis due to calcified coronary lesion: Secondary | ICD-10-CM | POA: Diagnosis not present

## 2016-10-01 DIAGNOSIS — I081 Rheumatic disorders of both mitral and tricuspid valves: Secondary | ICD-10-CM | POA: Diagnosis not present

## 2016-10-01 DIAGNOSIS — I2510 Atherosclerotic heart disease of native coronary artery without angina pectoris: Secondary | ICD-10-CM | POA: Diagnosis not present

## 2016-10-01 DIAGNOSIS — R0602 Shortness of breath: Secondary | ICD-10-CM | POA: Diagnosis not present

## 2016-10-01 DIAGNOSIS — I5032 Chronic diastolic (congestive) heart failure: Secondary | ICD-10-CM | POA: Diagnosis not present

## 2016-10-01 DIAGNOSIS — E1122 Type 2 diabetes mellitus with diabetic chronic kidney disease: Secondary | ICD-10-CM | POA: Diagnosis not present

## 2016-10-01 DIAGNOSIS — R9439 Abnormal result of other cardiovascular function study: Secondary | ICD-10-CM | POA: Diagnosis not present

## 2016-10-01 DIAGNOSIS — E785 Hyperlipidemia, unspecified: Secondary | ICD-10-CM | POA: Diagnosis not present

## 2016-10-01 DIAGNOSIS — I481 Persistent atrial fibrillation: Secondary | ICD-10-CM | POA: Diagnosis not present

## 2016-10-01 LAB — COMPREHENSIVE METABOLIC PANEL
ALBUMIN: 3.3 g/dL — AB (ref 3.5–5.0)
ALT: 16 U/L — ABNORMAL LOW (ref 17–63)
AST: 19 U/L (ref 15–41)
Alkaline Phosphatase: 92 U/L (ref 38–126)
Anion gap: 7 (ref 5–15)
BUN: 24 mg/dL — AB (ref 6–20)
CHLORIDE: 104 mmol/L (ref 101–111)
CO2: 30 mmol/L (ref 22–32)
Calcium: 8.7 mg/dL — ABNORMAL LOW (ref 8.9–10.3)
Creatinine, Ser: 2.24 mg/dL — ABNORMAL HIGH (ref 0.61–1.24)
GFR calc Af Amer: 29 mL/min — ABNORMAL LOW (ref >60)
GFR, EST NON AFRICAN AMERICAN: 25 mL/min — AB (ref >60)
Glucose, Bld: 177 mg/dL — ABNORMAL HIGH (ref 65–99)
POTASSIUM: 3.1 mmol/L — AB (ref 3.5–5.1)
Sodium: 141 mmol/L (ref 135–145)
Total Bilirubin: 0.5 mg/dL (ref 0.3–1.2)
Total Protein: 5.7 g/dL — ABNORMAL LOW (ref 6.5–8.1)

## 2016-10-01 LAB — ECHOCARDIOGRAM COMPLETE
Height: 69 in
WEIGHTICAEL: 3506.2 [oz_av]

## 2016-10-01 LAB — BLOOD GAS, ARTERIAL
ACID-BASE EXCESS: 4.3 mmol/L — AB (ref 0.0–2.0)
BICARBONATE: 28.5 mmol/L — AB (ref 20.0–28.0)
Drawn by: 441661
O2 SAT: 94.6 %
Patient temperature: 98.6
pCO2 arterial: 44 mmHg (ref 32.0–48.0)
pH, Arterial: 7.427 (ref 7.350–7.450)
pO2, Arterial: 74.4 mmHg — ABNORMAL LOW (ref 83.0–108.0)

## 2016-10-01 LAB — CBC
HEMATOCRIT: 35.5 % — AB (ref 39.0–52.0)
Hemoglobin: 11.4 g/dL — ABNORMAL LOW (ref 13.0–17.0)
MCH: 29.1 pg (ref 26.0–34.0)
MCHC: 32.1 g/dL (ref 30.0–36.0)
MCV: 90.6 fL (ref 78.0–100.0)
PLATELETS: 194 10*3/uL (ref 150–400)
RBC: 3.92 MIL/uL — ABNORMAL LOW (ref 4.22–5.81)
RDW: 14.1 % (ref 11.5–15.5)
WBC: 7.8 10*3/uL (ref 4.0–10.5)

## 2016-10-01 LAB — GLUCOSE, CAPILLARY
Glucose-Capillary: 177 mg/dL — ABNORMAL HIGH (ref 65–99)
Glucose-Capillary: 180 mg/dL — ABNORMAL HIGH (ref 65–99)

## 2016-10-01 LAB — HEMOGLOBIN A1C
HEMOGLOBIN A1C: 7.3 % — AB (ref 4.8–5.6)
Mean Plasma Glucose: 162.81 mg/dL

## 2016-10-01 LAB — PREALBUMIN: PREALBUMIN: 23.9 mg/dL (ref 18–38)

## 2016-10-01 LAB — HEPARIN LEVEL (UNFRACTIONATED): Heparin Unfractionated: 0.15 IU/mL — ABNORMAL LOW (ref 0.30–0.70)

## 2016-10-01 LAB — PROTIME-INR
INR: 1
PROTHROMBIN TIME: 13.1 s (ref 11.4–15.2)

## 2016-10-01 LAB — TSH: TSH: 6.623 u[IU]/mL — ABNORMAL HIGH (ref 0.350–4.500)

## 2016-10-01 LAB — APTT: APTT: 29 s (ref 24–36)

## 2016-10-01 MED ORDER — ASPIRIN 81 MG PO CHEW
81.0000 mg | CHEWABLE_TABLET | Freq: Every day | ORAL | Status: DC
  Administered 2016-10-01: 12:00:00 81 mg via ORAL
  Filled 2016-10-01: qty 1

## 2016-10-01 MED ORDER — ASPIRIN 81 MG PO CHEW: 81.0000 mg | CHEWABLE_TABLET | Freq: Every day | ORAL | Status: AC

## 2016-10-01 MED ORDER — PERFLUTREN LIPID MICROSPHERE
1.0000 mL | INTRAVENOUS | Status: AC | PRN
  Filled 2016-10-01: qty 10

## 2016-10-01 MED ORDER — HEPARIN (PORCINE) IN NACL 100-0.45 UNIT/ML-% IJ SOLN
1100.0000 [IU]/h | INTRAMUSCULAR | Status: DC
  Administered 2016-10-01: 08:00:00 1100 [IU]/h via INTRAVENOUS
  Filled 2016-10-01: qty 250

## 2016-10-01 NOTE — Care Management Note (Signed)
Case Management Note  Patient Details  Name: [name] MRN: [id number] Date of Birth: [birth year]  Subjective/Objective:   From home, presents with chest pain, s/p right and left heart cath/ coronary angiography, results of cath- Ostial 90% calcified left main stenosis. 50% proximal LAD calcified stenosis. CT surgery consultation regarding CABG.                   Action/Plan: NCM will follow for dc needs.  Expected Discharge Date:                  Expected Discharge Plan:  Home/Self Care  In-House Referral:     Discharge planning Services  CM Consult  Post Acute Care Choice:    Choice offered to:     DME Arranged:    DME Agency:     HH Arranged:    HH Agency:     Status of Service:  In process, will continue to follow  If discussed at Long Length of Stay Meetings, dates discussed:    Additional Comments:  Zenon Mayo, RN 10/01/2016, 8:48 AM

## 2016-10-01 NOTE — Consult Note (Signed)
[address]       [place] [address]             [phone]          CARDIOTHORACIC SURGERY CONSULTATION REPORT  PCP is Cox, Elnita Maxwell, MD Referring Provider is Adrian Prows, MD  Reason for consultation:  Left Main Coronary Artery Disease  HPI:  Patient is an 83 year old male with history of hypertension, chronic diastolic congestive heart failure, stage IV chronic kidney disease, hyperlipidemia, recurrent paroxysmal atrial fibrillation on long-term anticoagulation, bronchiectasis with chronic dyspnea on exertion, COPD with remote history of tobacco use, asthma, rheumatoid arthritis, type 2 diabetes mellitus, and anemia who has been referred for surgical consultation to discuss treatment options for management of left main coronary artery disease.  The patient has no previous history of coronary artery disease.  He has history of paroxysmal atrial fibrillation dating back several years for which she has been followed by Dr. Einar Gip.  The patient states that he first began to experience significant exertional shortness of breath several years ago.  In 2015 an echocardiogram was performed demonstrating severe diastolic dysfunction.  Noninvasive stress testing revealed resting ejection fraction 44%. No signs of ischemia. Medical therapy was adjusted. In November 2017 he was hospitalized with acute exacerbation of symptoms of dyspnea on exertion, orthopnea, and lower extremity edema. Immediately prior to admission he had run out of his diuretic medications.  Symptoms improved with diuresis.  Over the past year the patient states that symptoms have progressed such that he now gets short of breath with very low level activity and occasionally at rest. He has episodes of orthopnea without PND. He has had chronic lower extremity edema.  He was subsequently scheduled for elective diagnostic cardiac catheterization which was performed yesterday evening and notable for 90% stenosis of the  left main coronary artery.  Cardiothoracic surgical consultation was requested.  The patient is married and lives locally in [place] with his wife. 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 He states that his primary physical limitation is that of exertional shortness of breath. He does have long-standing rheumatoid arthritis which affects his mobility to some degree, but he is able to ambulate without using a cane or walker. He states that he cannot do much of anything without getting short of breath. He has never had any chest pain or chest tightness. He has not had dizzy spells or syncope. He has history of paroxysmal atrial fibrillation but no history of palpitations and he cannot tell whether or not he is in or out of rhythm. He has been anticoagulated for several years using Eliquis without any bleeding complications. He has no history of TIA or stroke. He has long-standing type 2 diabetes mellitus for which he is now using insulin. He claims that glycemic control has been good.  Past Medical History:  Diagnosis Date  . Anemia   . Atrial fibrillation ([facility]) 12/08/2013  . Cataract   . CHF (congestive heart failure) ([place])   . Chronic kidney disease   . Constipation    takes stool softener daily  . Dysrhythmia    a-fib  . GERD (gastroesophageal reflux disease)    takes Zantac daily   . History of blood transfusion    "related to one of my ORs"  .  Hyperlipidemia    takes Pravastatin daily  . Hypertension    takes Cardizem,Proscar,and Lisinopril daily  . Hypothyroidism   . Impaired hearing    wears hearing aids  . Joint pain   . Joint swelling   . Peripheral neuropathy   . Rheumatoid arthritis ([facility])    "all over; take orencia  q 4 wks" (02/01/2013)  . Type II diabetes mellitus ([facility])    takes Metformin  daily    Past Surgical History:  Procedure Laterality Date  . AMPUTATION TOE Left 11/08/2015   Procedure: LEFT HALLUX AMPUTATION AT THE METATARSOPHALANGEAL JOINT;  Surgeon: Wylene Simmer, MD;  Location: [facility];  Service: Orthopedics;  Laterality: Left;  . ANKLE FUSION Left 2014  . BIOPSY THYROID  2014  . CARPAL TUNNEL RELEASE Bilateral 1990's  . CATARACT EXTRACTION W/ INTRAOCULAR LENS  IMPLANT, BILATERAL Bilateral 1990's  . COLONOSCOPY    . FOREIGN BODY REMOVAL Right 09/06/2013   Procedure: REMOVAL FOREIGN BODY RIGHT INDEX FINGER;  Surgeon: Daryll Brod, MD;  Location: [facility];  Service: Orthopedics;  Laterality: Right;  ANESTHESIA: IV REGIONAL FAB  . REPAIR TENDONS FOOT Left 1946; 1984   "farming accident; repaired tendons both times"  . RIGHT/LEFT HEART CATH AND CORONARY ANGIOGRAPHY N/A 09/30/2016   Procedure: RIGHT/LEFT HEART CATH AND CORONARY ANGIOGRAPHY;  Surgeon: Nigel Mormon, MD;  Location: [facility];  Service: Cardiovascular;  Laterality: N/A;  . SHOULDER ARTHROSCOPY W/ ROTATOR CUFF REPAIR Right 1990's  . THYROIDECTOMY Right 02/01/2013   Procedure: RIGHT THYROIDECTOMY LOBECTOMY;  Surgeon: Earnstine Regal, MD;  Location: [facility];  Service: General;  Laterality: Right;  . THYROIDECTOMY, PARTIAL Right 02/01/2013  . TONSILLECTOMY  1930's  . TOTAL KNEE ARTHROPLASTY Right 12/16/2010   Procedure: TOTAL KNEE ARTHROPLASTY; rt Surgeon: Rudean Haskell, MD;  Location: [facility];  Service: Orthopedics;  Laterality: Right;  . TOTAL SHOULDER REPLACEMENT Left 03/2010    Family History  Problem Relation Age of Onset  . Cancer Mother        breast  . Aneurysm Father     Social History   Social History  . Marital status: Married    Spouse name: N/A  . Number of children: N/A  . Years of education: N/A   Occupational History  . Not on file.   Social History Main Topics  . Smoking status: Former Smoker    Packs/day: 1.00    Years: 15.00     Types: Cigarettes    Quit date: 09/01/1965  . Smokeless tobacco: Never Used     Comment: quit smoking in 1967  . Alcohol use No     Comment: 02/01/2013 "stopped drinking in 1967; about an alcoholic when I quit"  . Drug use: No  . Sexual activity: No   Other Topics Concern  . Not on file   Social History Narrative  . No narrative on file    Prior to Admission medications   Medication Sig Start Date End Date Taking? Authorizing Provider  Abatacept (ORENCIA IV) Inject into the vein every 30 (thirty) days.   Yes [provider]  albuterol (PROVENTIL) (2.5 MG/3ML) 0.083% nebulizer solution Take 3 mLs (2.5 mg total) by nebulization every 6 (six) hours as needed for wheezing or shortness of breath. 07/01/16  Yes Juanito Doom, MD  atorvastatin (LIPITOR) 40 MG tablet Take 40 mg by mouth daily.   Yes [provider]  ELIQUIS 2.5 MG TABS tablet Take 2.5 mg by  mouth 2 (two) times daily. 01/22/14  Yes [provider]  ferrous sulfate 325 (65 FE) MG tablet Take 325 mg by mouth daily with breakfast.    Yes [provider]  finasteride (PROSCAR) 5 MG tablet Take 5 mg by mouth at bedtime.    Yes [provider]  fluticasone furoate-vilanterol (BREO ELLIPTA) 100-25 MCG/INH AEPB Inhale 1 puff into the lungs daily. 01/01/16  Yes Parrett, Tammy S, NP  folic acid (FOLVITE) 1 MG tablet Take 1 mg by mouth daily.     Yes [provider]  furosemide (LASIX) 80 MG tablet Take 80 mg by mouth 2 (two) times daily.   Yes [provider]  gabapentin (NEURONTIN) 300 MG capsule Take 1 capsule (300 mg total) by mouth 2 (two) times daily. 12/13/13  Yes Milagros Loll, MD  guaiFENesin (MUCINEX) 600 MG 12 hr tablet Take 1,200 mg by mouth 2 (two) times daily as needed for cough or to loosen phlegm.   Yes [provider]  HYDROcodone-acetaminophen (NORCO) 5-325 MG per tablet Take 1 tablet by mouth every 6 (six) hours as needed for moderate pain.  09/06/13  Yes Daryll Brod, MD  Insulin Glargine (TOUJEO SOLOSTAR) 300 UNIT/ML SOPN Inject 14 Units into the skin at bedtime.    Yes [provider]  isosorbide-hydrALAZINE (BIDIL) 20-37.5 MG tablet Take 1 tablet by mouth 3 (three) times daily.   Yes [provider]  leflunomide (ARAVA) 20 MG tablet Take 20 mg by mouth daily.  05/07/16  Yes [provider]  levothyroxine (SYNTHROID, LEVOTHROID) 25 MCG tablet Take 25 mcg by mouth daily before breakfast.   Yes [provider]  metoprolol (LOPRESSOR) 50 MG tablet Take 25 mg by mouth daily.    Yes [provider]  ranitidine (ZANTAC) 150 MG tablet Take 150 mg by mouth 2 (two) times daily.     Yes [provider]  Tamsulosin HCl (FLOMAX) 0.4 MG CAPS Take 0.4 mg by mouth at bedtime.    Yes [provider]  traZODone (DESYREL) 50 MG tablet Take 50 mg by mouth at bedtime as needed for sleep.   Yes [provider]    Current Facility-Administered Medications  Medication Dose Route Frequency Provider Last Rate Last Dose  . 0.9 %  sodium chloride infusion  250 mL Intravenous PRN Adrian Prows, MD      . 0.9 %  sodium chloride infusion  250 mL Intravenous PRN Patwardhan, Manish J, MD      . acetaminophen (TYLENOL) tablet 650 mg  650 mg Oral Q4H PRN Patwardhan, Manish J, MD      . albuterol (PROVENTIL) (2.5 MG/3ML) 0.083% nebulizer solution 2.5 mg  2.5 mg Nebulization Q6H PRN Patwardhan, Manish J, MD      . aspirin chewable tablet 81 mg  81 mg Oral Daily Patwardhan, Manish J, MD      . atorvastatin (LIPITOR) tablet 40 mg  40 mg Oral Daily Patwardhan, Manish J, MD   40 mg at 09/30/16 2142  . finasteride (PROSCAR) tablet 5 mg  5 mg Oral QHS Patwardhan, Manish J, MD   5 mg at 09/30/16 2142  . fluticasone furoate-vilanterol (BREO ELLIPTA) 100-25 MCG/INH 1 puff  1 puff Inhalation Daily Patwardhan, Manish J, MD      . folic acid (FOLVITE) tablet 1 mg  1 mg Oral Daily Patwardhan, Manish J, MD      .  gabapentin (NEURONTIN) capsule 300 mg  300 mg Oral BID Patwardhan, Manish  J, MD   300 mg at 09/30/16 2142  . guaiFENesin (MUCINEX) 12 hr tablet 1,200 mg  1,200 mg Oral BID PRN Patwardhan, Manish J, MD   1,200 mg at 09/30/16 2142  . heparin ADULT infusion 100 units/mL (25000 units/274mL sodium chloride 0.45%)  1,100 Units/hr Intravenous Continuous Patwardhan, Manish J, MD 11 mL/hr at 10/01/16 0815 1,100 Units/hr at 10/01/16 0815  . HYDROcodone-acetaminophen (NORCO/VICODIN) 5-325 MG per tablet 1 tablet  1 tablet Oral Q6H PRN Patwardhan, Manish J, MD      . insulin aspart (novoLOG) injection 0-20 Units  0-20 Units Subcutaneous TID WC Patwardhan, Reynold Bowen, MD   4 Units at 10/01/16 0731  . insulin glargine (LANTUS) injection 10 Units  10 Units Subcutaneous QHS Nigel Mormon, MD   10 Units at 09/30/16 2215  . isosorbide-hydrALAZINE (BIDIL) 20-37.5 MG per tablet 1 tablet  1 tablet Oral TID Nigel Mormon, MD   1 tablet at 09/30/16 2142  . leflunomide (ARAVA) tablet 20 mg  20 mg Oral Daily Patwardhan, Manish J, MD      . levothyroxine (SYNTHROID, LEVOTHROID) tablet 25 mcg  25 mcg Oral QAC breakfast Patwardhan, Reynold Bowen, MD   25 mcg at 10/01/16 [phone]  . metoprolol tartrate (LOPRESSOR) tablet 25 mg  25 mg Oral Daily Patwardhan, Manish J, MD   25 mg at 09/30/16 2143  . ondansetron (ZOFRAN) injection 4 mg  4 mg Intravenous Q6H PRN Patwardhan, Manish J, MD      . sodium chloride flush (NS) 0.9 % injection 3 mL  3 mL Intravenous Q12H Patwardhan, Manish J, MD   3 mL at 09/30/16 2330  . sodium chloride flush (NS) 0.9 % injection 3 mL  3 mL Intravenous PRN Patwardhan, Manish J, MD      . tamsulosin (FLOMAX) capsule 0.4 mg  0.4 mg Oral QHS Patwardhan, Manish J, MD   0.4 mg at 09/30/16 2142  . traZODone (DESYREL) tablet 50 mg  50 mg Oral QHS PRN Patwardhan, Manish J, MD   50 mg at 09/30/16 2340    No Known Allergies    Review of Systems:   General:  normal appetite, decreased energy, no weight gain,  no weight loss, no fever  Cardiac:  no chest pain with exertion, no chest pain at rest, + SOB with exertion, occasional resting SOB, no PND, + orthopnea, no palpitations, + arrhythmia, + atrial fibrillation, + LE edema, no dizzy spells, no syncope  Respiratory:  + shortness of breath, no home oxygen, no productive cough, + chronic dry cough, no bronchitis, no wheezing, no hemoptysis, + asthma, no pain with inspiration or cough, no sleep apnea, no CPAP at night  GI:   no difficulty swallowing, no reflux, no frequent heartburn, no hiatal hernia, no abdominal pain, no constipation, no diarrhea, no hematochezia, no hematemesis, no melena  GU:   no dysuria,  no frequency, no urinary tract infection, no hematuria, no enlarged prostate, no kidney stones, + kidney disease  Vascular:  no pain suggestive of claudication, no pain in feet, no leg cramps, no varicose veins, no DVT, no non-healing foot ulcer  Neuro:   no stroke, no TIA's, no seizures, no headaches, no temporary blindness one eye,  no slurred speech, + peripheral neuropathy, + chronic pain, mild instability of gait, no memory/cognitive dysfunction  Musculoskeletal: + arthritis, + joint swelling, no myalgias, no difficulty walking, somewhat decreased mobility   Skin:   no rash, no itching, no skin infections, no pressure sores or  ulcerations  Psych:   no anxiety, no depression, no nervousness, no unusual recent stress  Eyes:   no blurry vision, no floaters, no recent vision changes, + wears glasses or contacts  ENT:   no hearing loss, edentulous with full set dentures,  Hematologic:  + easy bruising, no abnormal bleeding, no clotting disorder, no frequent epistaxis  Endocrine:  + diabetes, does check CBG's at home     Physical Exam:   BP 126/66   Pulse 68   Temp (!) 97.5 F (36.4 C) (Oral)   Resp 16   Ht 5\' 9"  (1.753 m)   Wt 219 lb 2.2 oz (99.4 kg)   SpO2 95%   BMI 32.36 kg/m   General:  Moderately obese,   well-appearing  HEENT:  Unremarkable   Neck:   no JVD, no bruits, no adenopathy   Chest:   clear to auscultation, symmetrical breath sounds, no wheezes, no rhonchi   CV:   RRR, no  murmur   Abdomen:  soft, non-tender, no masses   Extremities:  warm, well-perfused, pulses diminished but palpable, mild lower extremity edema  Rectal/GU  Deferred  Neuro:   Grossly non-focal and symmetrical throughout  Skin:   Clean and dry, no rashes, no breakdown  Diagnostic Tests:  RIGHT/LEFT HEART CATH AND CORONARY ANGIOGRAPHY  Conclusion   Ostial 90% calcified left main stenosis. 50% proximal LAD calcified stenosis. Rest mild nonobstructive disease. Mild pulmonary hypertension.   CT surgery consultation regarding CABG Patient will be admitted to telemetry overnight.  Nigel Mormon, MD [facility] [place] Pager: [phone] Office: [phone] If no answer Cell [phone]     Indications   Shortness of breath on exertion [R06.02 (ICD-10-CM)]  Abnormal stress test [R94.39 (ICD-10-CM)]  Procedural Details/Technique   Technical Details Indication: Exertional shortness of breath Intermediate risk stress test  History:  83 y/o Caucasian male with paroxysmal atrial fibrillation, history of rheumatoid arthritis, stage IV chronic kidney disease, hypertension and mild hyperlipidemia presents here for follow-up of dyspnea on exertion. He was found to have lower LVEF with severe LV systolic dysfunction in 3614 and was aggressively treated with medical therapy without angiography due to renal failure but had undergone stress testing which had revealed decreased EF at 44% but no ischemia.  Over the past 4-5 months, due to worsening symptoms of dyspnea, class 3-4, patient unable to do activity of daily living without limitation. He saw Dr. Lake Bells and underwent evaluation that showed stable PFTs and CT scan. It was recommended that he follow-up with [place] for further evaluation. I saw  him on 08/19/2016, underwent nuclear stress test and echocardiogram and presents for follow-up. No change in symptoms.  He is been maintaining sinus rhythm and he is oriented correlation with Eliquis without bleeding diathesis. Has chronic mild fatigue and. Denies leg edema, PND or orthopnea. No chest pain. States that his diabetes is much improved.  Lexiscan myoview stress test 08/25/2016: 1. The patient had myocardial perfusion imaging performed using a rest Tc-35m/stressTc-41m one day protocol. 2. The resting electrocardiogram demonstrated normal sinus rhythm, normal resting conduction, no resting arrhythmias and normal rest repolarization. Stress EKG is non diagnostic for ischemia as it is a pharmacologic stress. However, incomplete left bundle branch block and ventricular bigeminy was noted during stress and recover phase. Stress symptoms included dyspnea, dizziness. 3. Left ventricular cavity is noted to be normal on the rest and stress studies. The left ventricular ejection fraction was calculated to be 34%. This may be due to incomplete  left bundle branch block and ventricular bigeminy noted during stress and recover phase.SPECT images demonstrate Medium perfusion abnormality of mild intensity in the basal inferolateral, mid inferior and mid inferolateral myocardial wall(s) on the stress images. The defect partially improves with stress, suggesting soft tissue attenuation. Small area of ischemia cannot be excluded. 4. This is an intermediate risk study.   Echocardiogram 08/21/2016: 1. Left ventricle cavity is normal in size. Moderate concentric hypertrophy of the left ventricle. Normal global wall motion. Visual EF is 55-60%. 2. Mild (Grade I) mitral regurgitation. 3. Mild tricuspid regurgitation. Pulmonary artery systolic pressure is estimated at 30-35 mm Hg. 4. Moderate pulmonic regurgitation.    Procedures: 1. Left and right heart catheterization 2. Selective left and right coronary  angiography  Right AC venous axis Right radial arterial access Right common femoral arterial access  Right heart catheterization performed through the right AC vein using a 4 Fr balloon tipped wedged catheter. Right radial 6 French sheath placed. Significant radial loop encountered which was conquered using a Glidewire. However significant spasm noted. At this point access was switched to right femoral.  5 [place] JL 3.5 catheter used for left carotid artery. Multiple catheterizations for right coronary artery, eventually engaged using a 5 [place] the [facility] catheter  Hemostasis: Rt radial artery: TR band Rt common femoral artery: Manual compression Rt AC vein: Manual compression  No complications   Estimated blood loss <50 mL.  During this procedure the patient was administered the following to achieve and maintain moderate conscious sedation: Versed 1 mg, Fentanyl 25 mcg, while the patient's heart rate, blood pressure, and oxygen saturation were continuously monitored. The period of conscious sedation was 68 minutes, of which I was present face-to-face 100% of this time.    Complications   Complications documented before study signed (09/30/2016 7:37 PM EDT)    RIGHT/LEFT HEART CATH AND CORONARY ANGIOGRAPHY   None Documented by Nigel Mormon, MD 09/30/2016 7:36 PM EDT  Time Range: Intra-procedure      Coronary Findings   Dominance: Right  Left Main  LM lesion, 90% stenosed. The lesion is calcified.  Left Anterior Descending  Ost LAD to Prox LAD lesion, 50% stenosed. The lesion is calcified.  Prox LAD to Mid LAD lesion, 50% stenosed. The lesion is calcified.  Right Coronary Artery  Vessel is moderate in size.  Prox RCA lesion, 30% stenosed.  Mid RCA lesion, 30% stenosed.  Right Posterior Descending Artery  Vessel is small in size.  RPDA lesion, 50% stenosed.  Right Heart   Right Heart Pressures Hemodynamic findings consistent with pulmonary hypertension. Elevated  LV EDP consistent with volume overload. Mild pulmonary hypertension    Coronary Diagrams   Diagnostic Diagram       Implants     No implant documentation for this case.  PACS Images   Show images for CARDIAC CATHETERIZATION   Link to Procedure Log   Procedure Log    Hemo Data    Most Recent Value  RA A Wave 5 mmHg  RA V Wave 4 mmHg  RA Mean 4 mmHg  RV Systolic Pressure 38 mmHg  RV Diastolic Pressure 0 mmHg  RV EDP 5 mmHg  PA Systolic Pressure 36 mmHg  PA Diastolic Pressure 12 mmHg  PA Mean 21 mmHg  PW A Wave 13 mmHg  PW V Wave 19 mmHg  PW Mean 13 mmHg  AO Systolic Pressure 315 mmHg  AO Diastolic Pressure 69 mmHg  AO Mean 176 mmHg  LV Systolic  Pressure 308 mmHg  LV Diastolic Pressure 3 mmHg  LV EDP 14 mmHg  Arterial Occlusion Pressure Extended Systolic Pressure 657 mmHg  Arterial Occlusion Pressure Extended Diastolic Pressure 68 mmHg  Arterial Occlusion Pressure Extended Mean Pressure 99 mmHg  Left Ventricular Apex Extended Systolic Pressure 846 mmHg  Left Ventricular Apex Extended Diastolic Pressure 1 mmHg  Left Ventricular Apex Extended EDP Pressure 10 mmHg    STS Risk Calculator Procedure: CAB Only  Risk of Mortality: 11.229%  Morbidity or Mortality: 45.775%  Long Length of Stay: 32.165%  Short Length of Stay: 6.437%  Permanent Stroke: 2.087%  Prolonged Ventilation: 24.554%  DSW Infection: 1.51%  Renal Failure: 30.265%  Reoperation: 15.93%      Impression:  Patient has severe left main coronary artery disease with what appears to be preserved left ventricular systolic function. He presents with a long history of dyspnea on exertion and other symptoms consistent with chronic combined systolic and diastolic congestive heart failure, [place] Heart Association function class III. He denies any history of symptoms of exertional chest pain or chest tightness and there is no history of previous myocardial infarction. Symptoms are unquestionably  multifactorial and related to long-standing hypertension with diastolic dysfunction and chronic kidney disease.  I have personally reviewed the patient's diagnostic cardiac catheterization which reveals ostial 80-90% stenosis of the left main coronary artery. There is otherwise diffuse nonobstructive coronary artery disease throughout the remainder of the epicardial coronary vessels. Left ventricular systolic function appears mildly reduced. I agree the patient would benefit from surgical revascularization. Risks associated with surgery will be somewhat elevated because of the patient's age and numerous comorbid medical problems. Nevertheless, the patient seems to be doing reasonably well for a gentleman his age and I do not feel that surgical risks are prohibitive. In addition, he has history of recurrent paroxysmal atrial fibrillation and might benefit from concomitant maze procedure.    Plan:  I have reviewed the indications, risks, and potential benefits of coronary artery bypass grafting with the patient and his family.  Alternative treatment strategies have been discussed, including the relative risks, benefits and long term prognosis associated with medical therapy, percutaneous coronary intervention, and surgical revascularization.  The patient understands and accepts all potential associated risks of surgery including but not limited to risk of death, stroke or other neurologic complication, myocardial infarction, congestive heart failure, respiratory failure, renal failure, bleeding requiring blood transfusion and/or reexploration, aortic dissection or other major vascular complication, arrhythmia, heart block or bradycardia requiring permanent pacemaker, pneumonia, pleural effusion, wound infection, pulmonary embolus or other thromboembolic complication, chronic pain or other delayed complications related to median sternotomy, or the late recurrence of symptomatic ischemic heart disease and/or  congestive heart failure.  The relative risks and benefits of performing a maze procedure at the time of his surgery was discussed at length, including the expected likelihood of long term freedom from recurrent symptomatic atrial fibrillation and/or atrial flutter.  The importance of long term risk modification have been emphasized.  All questions answered.  We tentatively plan to proceed with surgery on 10/09/2016. The patient should not resume anticoagulation using Eliquis until after his surgery.    I spent in excess of 120 minutes during the conduct of this hospital consultation and >50% of this time involved direct face-to-face encounter for counseling and/or coordination of the patient's care.    Valentina Gu. Roxy Manns, MD 10/01/2016 9:10 AM

## 2016-10-01 NOTE — Progress Notes (Signed)
ANTICOAGULATION CONSULT NOTE - Initial Consult  Pharmacy Consult for heparin Indication: chest pain/ACS  No Known Allergies  Patient Measurements: Height: 5\' 9"  (175.3 cm) Weight: 219 lb 2.2 oz (99.4 kg) IBW/kg (Calculated) : 70.7 Heparin Dosing Weight: 91.7 kg  Vital Signs: Temp: 97.5 F (36.4 C) (09/19 0600) Temp Source: Oral (09/19 0600) BP: 108/44 (09/19 0600) Pulse Rate: 67 (09/19 0600)  Labs:  Recent Labs  10/01/16 0653  HGB 11.4*  HCT 35.5*  PLT 194  APTT 29  LABPROT 13.1  INR 1.00    CrCl cannot be calculated (Patient's most recent lab result is older than the maximum 21 days allowed.).   Medical History: Past Medical History:  Diagnosis Date  . Anemia   . Atrial fibrillation ([place]) 12/08/2013  . Cataract   . CHF (congestive heart failure) ([place])   . Chronic kidney disease   . Constipation    takes stool softener daily  . Dysrhythmia    a-fib  . GERD (gastroesophageal reflux disease)    takes Zantac daily   . History of blood transfusion    "related to one of my ORs"  . Hyperlipidemia    takes Pravastatin daily  . Hypertension    takes Cardizem,Proscar,and Lisinopril daily  . Hypothyroidism   . Impaired hearing    wears hearing aids  . Joint pain   . Joint swelling   . Peripheral neuropathy   . Rheumatoid arthritis ([place])    "all over; take orencia  q 4 wks" (02/01/2013)  . Type II diabetes mellitus ([facility])    takes Metformin daily    Medications:  Scheduled:  . atorvastatin  40 mg Oral Daily  . finasteride  5 mg Oral QHS  . fluticasone furoate-vilanterol  1 puff Inhalation Daily  . folic acid  1 mg Oral Daily  . gabapentin  300 mg Oral BID  . insulin aspart  0-20 Units Subcutaneous TID WC  . insulin glargine  10 Units Subcutaneous QHS  . isosorbide-hydrALAZINE  1 tablet Oral TID  . leflunomide  20 mg Oral Daily  . levothyroxine  25 mcg Oral QAC breakfast  . metoprolol tartrate  25 mg Oral Daily  . sodium chloride flush  3 mL  Intravenous Q12H  . tamsulosin  0.4 mg Oral QHS    Assessment: [name] found to have 90% left main stenosis and William% LAD. Awaiting CT surgery consultation regarding CABG. Last took Eliquis on 9/17 at 0800.   Goal of Therapy:  Heparin level 0.3-0.7 units/ml aPTT 66-102 seconds Monitor platelets by anticoagulation protocol: Yes   Plan:  Start heparin infusion at 1100 units/hr Check anti-Xa level in 8 hours and daily while on heparin Continue to monitor H&H and platelets  Doylene Canard, PharmD Clinical Pharmacist  Phone: [phone] 10/01/2016,7:54 AM

## 2016-10-01 NOTE — Discharge Summary (Signed)
Physician Discharge Summary  Patient ID: [name] MRN: [id number] DOB/AGE: 09-04-1930 83 y.o.  Admit date: 09/30/2016 Discharge date: 10/01/2016  Primary Discharge Diagnosis CAD  Secondary Discharge Diagnosis Paroxysmal Afib Hypertension Type 2 diabetes meliitus CKD 4  Significant Diagnostic Studies: Cardiac catheterization  Hospital Course:  Patient underwent digastric artery catheterization for in risk stress test in the setting of ex  He has 90% ostial left main stenosis a  Mild nonobstructive CAD.  CT surgery were consulted.  He will be undergoing coronary artery bypass surgery next week.  Recommendation holding eliquis as per cardiac surgery.  Recommendations on discharge:  Avoid strenuous physical activity.  Discharge Exam: Blood pressure 126/66, pulse 68, temperature (!) 97.5 F (36.4 C), temperature source Oral, resp. rate 16, height 5\' 9"  (1.753 m), weight 99.4 kg (219 lb 2.2 oz), SpO2 95 %.   General Mental Status-Alert. General Appearance-Cooperative, Appears younger than stated age, Not in acute distress. Orientation-Oriented X3. Build & Nutrition-Moderately built and Mildly obese.  Head and Neck Thyroid Gland Characteristics - no palpable nodules, no palpable enlargement.  Chest and Lung Exam Chest and lung exam reveals -quiet, even and easy respiratory effort with no use of accessory muscles and on auscultation, normal breath sounds, no adventitious sounds.  Cardiovascular Cardiovascular examination reveals -normal heart sounds, regular rate and rhythm with no murmurs, carotid auscultation reveals no bruits, abdominal aorta auscultation reveals no bruits and no prominent pulsation, femoral artery auscultation bilaterally reveals normal pulses, no bruits, no thrills, normal pedal pulses bilaterally and no digital clubbing, cyanosis, edema, increased warmth or tenderness. Right radial and right common femoral cath site with no  hematoma.  Abdomen Palpation/Percussion Normal exam - Non Tender and No hepatosplenomegaly. Auscultation Normal exam - Bowel sounds normal.  Neurologic Motor-Grossly intact without any focal deficits.  Musculoskeletal Global Assessment Left Lower Extremity - normal range of motion without pain. Right Lower Extremity - normal range of motion without pain.  Labs:   Lab Results  Component Value Date   WBC 7.8 10/01/2016   HGB 11.4 (L) 10/01/2016   HCT 35.5 (L) 10/01/2016   MCV 90.6 10/01/2016   PLT 194 10/01/2016    Recent Labs Lab 10/01/16 0653  NA 141  K 3.1*  CL 104  CO2 30  BUN 24*  CREATININE 2.24*  CALCIUM 8.7*  PROT 5.7*  BILITOT 0.5  ALKPHOS 92  ALT 16*  AST 19  GLUCOSE 177*    Lipid Panel     Component Value Date/Time   CHOL 179 08/20/2014 0648   TRIG 105 08/20/2014 0648   HDL 30 (L) 08/20/2014 0648   CHOLHDL 6.0 08/20/2014 0648   VLDL 21 08/20/2014 0648   LDLCALC 128 (H) 08/20/2014 0648    BNP (last 3 results)  Recent Labs  12/01/15 0756  BNP 429.8*    HEMOGLOBIN A1C Lab Results  Component Value Date   HGBA1C 7.3 (H) 10/01/2016   MPG 162.81 10/01/2016    Cardiac Panel (last 3 results)  Recent Labs  12/01/15 1805 12/02/15 0102 12/02/15 0653  TROPONINI 0.11* 0.13* 0.15*    Lab Results  Component Value Date   TROPONINI 0.15 ([place]) 12/02/2015     TSH  Recent Labs  12/01/15 1247 10/01/16 0653  TSH 2.103 6.623*    EKG 10/02/18 18/07/13: Sinus rhythm 69 bpm.  Left bundle branch block.  Not new.   Radiology: Dg Chest 2 View  Result Date: 10/01/2016 CLINICAL DATA:  Shortness of breath. EXAM: CHEST  2 VIEW  COMPARISON:  12/01/2015 and CT chest 09/25/2015. FINDINGS: Trachea is midline. Heart size normal. Mild interstitial coarsening at the lung bases, as on prior exams. Lungs are otherwise clear. No pleural fluid. Degenerative changes in the spine. IMPRESSION: 1. No acute findings. 2. Mild basilar interstitial  coarsening, as on prior exams, indicative of mild fibrotic interstitial lung disease. Electronically Signed   By: Lorin Picket M.D.   On: 10/01/2016 09:32      FOLLOW UP PLANS AND APPOINTMENTS  Allergies as of 10/01/2016   No Known Allergies     Medication List    STOP taking these medications   ELIQUIS 2.5 MG Tabs tablet Generic drug:  apixaban     TAKE these medications   albuterol (2.5 MG/3ML) 0.083% nebulizer solution Commonly known as:  PROVENTIL Take 3 mLs (2.5 mg total) by nebulization every 6 (six) hours as needed for wheezing or shortness of breath.   aspirin 81 MG chewable tablet Chew 1 tablet (81 mg total) by mouth daily.   atorvastatin 40 MG tablet Commonly known as:  LIPITOR Take 40 mg by mouth daily.   ferrous sulfate 325 (65 FE) MG tablet Take 325 mg by mouth daily with breakfast.   finasteride 5 MG tablet Commonly known as:  PROSCAR Take 5 mg by mouth at bedtime.   fluticasone furoate-vilanterol 100-25 MCG/INH Aepb Commonly known as:  BREO ELLIPTA Inhale 1 puff into the lungs daily.   folic acid 1 MG tablet Commonly known as:  FOLVITE Take 1 mg by mouth daily.   furosemide 80 MG tablet Commonly known as:  LASIX Take 80 mg by mouth 2 (two) times daily.   gabapentin 300 MG capsule Commonly known as:  NEURONTIN Take 1 capsule (300 mg total) by mouth 2 (two) times daily.   HYDROcodone-acetaminophen 5-325 MG tablet Commonly known as:  NORCO Take 1 tablet by mouth every 6 (six) hours as needed for moderate pain.   isosorbide-hydrALAZINE 20-37.5 MG tablet Commonly known as:  BIDIL Take 1 tablet by mouth 3 (three) times daily.   leflunomide 20 MG tablet Commonly known as:  ARAVA Take 20 mg by mouth daily.   levothyroxine 25 MCG tablet Commonly known as:  SYNTHROID, LEVOTHROID Take 25 mcg by mouth daily before breakfast.   metoprolol tartrate 50 MG tablet Commonly known as:  LOPRESSOR Take 25 mg by mouth daily.   MUCINEX 600 MG 12 hr  tablet Generic drug:  guaiFENesin Take 1,200 mg by mouth 2 (two) times daily as needed for cough or to loosen phlegm.   ORENCIA IV Inject into the vein every 30 (thirty) days.   ranitidine 150 MG tablet Commonly known as:  ZANTAC Take 150 mg by mouth 2 (two) times daily.   tamsulosin 0.4 MG Caps capsule Commonly known as:  FLOMAX Take 0.4 mg by mouth at bedtime.   TOUJEO SOLOSTAR 300 UNIT/ML Sopn Generic drug:  Insulin Glargine Inject 14 Units into the skin at bedtime.   traZODone 50 MG tablet Commonly known as:  DESYREL Take 50 mg by mouth at bedtime as needed for sleep.            Discharge Care Instructions        Start     Ordered   10/01/16 0000  aspirin 81 MG chewable tablet  Daily     10/01/16 1610     Follow-up Information    Adrian Prows, MD Follow up on 10/08/2016.   Specialty:  Cardiology Why:  11:30 AM Contact  information: [address] [place] [address] [phone]        Rexene Alberts, MD Follow up.   Specialty:  Cardiothoracic Surgery Why:  Their office will make arrangements for your bypass surgery Contact information: [address] [place] [address] [place], [place] [facility] [place] Pager: [phone] Office: [phone] If no answer Cell [phone]

## 2016-10-01 NOTE — Progress Notes (Signed)
Subjective:  Feeling well. Wanting to know about the next options regarding coronary revascularization.  Objective:  Vital Signs in the last 24 hours: Temp:  [97.5 F (36.4 C)-97.8 F (36.6 C)] 97.5 F (36.4 C) (09/19 0600) Pulse Rate:  [67-141] 67 (09/19 0600) Resp:  [11-23] 20 (09/18 2330) BP: (108-177)/(44-132) 108/44 (09/19 0600) SpO2:  [94 %-100 %] 94 % (09/18 2330) Weight:  [99.4 kg (219 lb 2.2 oz)] 99.4 kg (219 lb 2.2 oz) (09/19 0600)  Intake/Output from previous day: 09/18 0701 - 09/19 0700 In: 328 [I.V.:328] Out: 1250 [Urine:1250] Intake/Output from this shift: No intake/output data recorded.  Physical Exam:  General Mental Status-Alert. General Appearance-Cooperative, Appears younger than stated age, Not in acute distress. Orientation-Oriented X3. Build & Nutrition-Moderately built and Mildly obese.  Head and Neck Thyroid Gland Characteristics - no palpable nodules, no palpable enlargement.  Chest and Lung Exam Chest and lung exam reveals -quiet, even and easy respiratory effort with no use of accessory muscles and on auscultation, normal breath sounds, no adventitious sounds.  Cardiovascular Cardiovascular examination reveals -normal heart sounds, regular rate and rhythm with no murmurs, carotid auscultation reveals no bruits, abdominal aorta auscultation reveals no bruits and no prominent pulsation, femoral artery auscultation bilaterally reveals normal pulses, no bruits, no thrills, normal pedal pulses bilaterally and no digital clubbing, cyanosis, edema, increased warmth or tenderness. Right radial and right common femoral cath site with no hematoma.  Abdomen Palpation/Percussion Normal exam - Non Tender and No hepatosplenomegaly. Auscultation Normal exam - Bowel sounds normal.  Neurologic Motor-Grossly intact without any focal deficits.  Musculoskeletal Global Assessment Left Lower Extremity - normal range of motion without pain.  Right Lower Extremity - normal range of motion without pain.   Lab Results:  Recent Labs  10/01/16 0653  WBC 7.8  HGB 11.4*  PLT 194    Recent Labs  10/01/16 0653  NA 141  K 3.1*  CL 104  CO2 30  GLUCOSE 177*  BUN 24*  CREATININE 2.24*   No results for input(s): TROPONINI in the last 72 hours.  Invalid input(s): CK, MB Hepatic Function Panel  Recent Labs  10/01/16 0653  PROT 5.7*  ALBUMIN 3.3*  AST 19  ALT 16*  ALKPHOS 92  BILITOT 0.5    Cardiac Studies: Ostial 90% calcified left main stenosis. 50% proximal LAD calcified stenosis. Rest mild nonobstructive disease. Mild pulmonary hypertension.   CT surgery consultation regarding CABG Patient will be admitted to telemetry overnight.  Echocardiogram pending  Assessment/Plan:  CAD Paroxysmal Afib Hypertension Type 2 diabetes meliitus CKD 4  Plan: Admutted to telemety Keep on heparin Awaiting CT surgery consult for CABG.  Continue cardiac meds including  Lipitor, bidil, metoprolol. Add ASA 81 mg    LOS: 0 days    Jayley Hustead J Laruth Hanger 10/01/2016, 8:21 AM  Shaquavia Whisonant Esther Hardy, MD [facility] [place] Pager: [phone] Office: [phone] If no answer Cell [phone]

## 2016-10-01 NOTE — Progress Notes (Signed)
  Echocardiogram 2D Echocardiogram has been performed.  Jennette Dubin 10/01/2016, 10:12 AM

## 2016-10-01 NOTE — Progress Notes (Signed)
TR BAND REMOVAL  LOCATION:    R radial  DEFLATED PER PROTOCOL:    Yes  TIME BAND OFF / DRESSING APPLIED:    20:00 SITE UPON ARRIVAL:   0  SITE AFTER BAND REMOVAL:    Level 0  CIRCULATION SENSATION AND MOVEMENT:    Within Normal Limits   Yes  COMMENTS:  Pt tolerated procedure well. VSS. Post band removal instructions given to pt and family.

## 2016-10-01 NOTE — Care Management Obs Status (Signed)
[facility] NOTIFICATION   Patient Details  Name: [name] MRN: [id number] Date of Birth: 1931/01/09   Medicare Observation Status Notification Given:  Yes    Zenon Mayo, RN 10/01/2016, 10:10 AM

## 2016-10-01 NOTE — Care Management Note (Signed)
Case Management Note  Patient Details  Name: [name] MRN: [id number] Date of Birth: [birth year]  Subjective/Objective:  From home, presents with chest pain, s/p right and left heart cath/ coronary angiography, results of cath- Ostial 90% calcified left main stenosis. 50% proximal LAD calcified stenosis. CT surgery consultation regarding CABG. For dc today.                    Action/Plan:   Expected Discharge Date:  10/01/16               Expected Discharge Plan:  Home/Self Care  In-House Referral:     Discharge planning Services  CM Consult  Post Acute Care Choice:    Choice offered to:     DME Arranged:    DME Agency:     HH Arranged:    HH Agency:     Status of Service:  Completed, signed off  If discussed at [employer] of Stay Meetings, dates discussed:    Additional Comments:  Zenon Mayo, RN 10/01/2016, 3:01 PM

## 2016-10-02 ENCOUNTER — Other Ambulatory Visit (HOSPITAL_COMMUNITY): Payer: Self-pay | Admitting: Pharmacy Technician

## 2016-10-06 ENCOUNTER — Ambulatory Visit (HOSPITAL_BASED_OUTPATIENT_CLINIC_OR_DEPARTMENT_OTHER)
Admit: 2016-10-06 | Discharge: 2016-10-06 | Disposition: A | Discharge status: Home / Self Care | Payer: Medicare Other | Attending: Thoracic Surgery (Cardiothoracic Vascular Surgery) | Admitting: Thoracic Surgery (Cardiothoracic Vascular Surgery)

## 2016-10-06 ENCOUNTER — Encounter (HOSPITAL_COMMUNITY)
Admit: 2016-10-06 | Discharge: 2016-10-06 | Disposition: A | Discharge status: Home / Self Care | Payer: Medicare Other | Attending: Thoracic Surgery (Cardiothoracic Vascular Surgery) | Admitting: Thoracic Surgery (Cardiothoracic Vascular Surgery)

## 2016-10-06 ENCOUNTER — Encounter: Payer: Self-pay | Admitting: Thoracic Surgery (Cardiothoracic Vascular Surgery)

## 2016-10-06 ENCOUNTER — Other Ambulatory Visit (HOSPITAL_COMMUNITY): Payer: Medicare Other

## 2016-10-06 ENCOUNTER — Encounter (HOSPITAL_COMMUNITY): Payer: Self-pay

## 2016-10-06 ENCOUNTER — Ambulatory Visit (INDEPENDENT_AMBULATORY_CARE_PROVIDER_SITE_OTHER): Payer: Medicare Other | Admitting: Thoracic Surgery (Cardiothoracic Vascular Surgery)

## 2016-10-06 VITALS — BP 98/59 | HR 74 | Resp 16 | Ht 68.0 in | Wt 212.0 lb

## 2016-10-06 DIAGNOSIS — Z01812 Encounter for preprocedural laboratory examination: Secondary | ICD-10-CM | POA: Insufficient documentation

## 2016-10-06 DIAGNOSIS — Z01810 Encounter for preprocedural cardiovascular examination: Secondary | ICD-10-CM | POA: Insufficient documentation

## 2016-10-06 DIAGNOSIS — I5042 Chronic combined systolic (congestive) and diastolic (congestive) heart failure: Secondary | ICD-10-CM

## 2016-10-06 DIAGNOSIS — I5022 Chronic systolic (congestive) heart failure: Secondary | ICD-10-CM | POA: Diagnosis not present

## 2016-10-06 DIAGNOSIS — I2510 Atherosclerotic heart disease of native coronary artery without angina pectoris: Secondary | ICD-10-CM

## 2016-10-06 DIAGNOSIS — I209 Angina pectoris, unspecified: Secondary | ICD-10-CM

## 2016-10-06 DIAGNOSIS — I480 Paroxysmal atrial fibrillation: Secondary | ICD-10-CM

## 2016-10-06 DIAGNOSIS — I6523 Occlusion and stenosis of bilateral carotid arteries: Secondary | ICD-10-CM

## 2016-10-06 DIAGNOSIS — I25119 Atherosclerotic heart disease of native coronary artery with unspecified angina pectoris: Secondary | ICD-10-CM | POA: Diagnosis not present

## 2016-10-06 LAB — COMPREHENSIVE METABOLIC PANEL
ALT: 15 U/L — ABNORMAL LOW (ref 17–63)
ANION GAP: 11 (ref 5–15)
AST: 19 U/L (ref 15–41)
Albumin: 3.6 g/dL (ref 3.5–5.0)
Alkaline Phosphatase: 94 U/L (ref 38–126)
BUN: 37 mg/dL — ABNORMAL HIGH (ref 6–20)
CO2: 22 mmol/L (ref 22–32)
Calcium: 9 mg/dL (ref 8.9–10.3)
Chloride: 105 mmol/L (ref 101–111)
Creatinine, Ser: 2.5 mg/dL — ABNORMAL HIGH (ref 0.61–1.24)
GFR calc Af Amer: 26 mL/min — ABNORMAL LOW (ref >60)
GFR, EST NON AFRICAN AMERICAN: 22 mL/min — AB (ref >60)
Glucose, Bld: 163 mg/dL — ABNORMAL HIGH (ref 65–99)
POTASSIUM: 3.5 mmol/L (ref 3.5–5.1)
Sodium: 138 mmol/L (ref 135–145)
TOTAL PROTEIN: 6.4 g/dL — AB (ref 6.5–8.1)
Total Bilirubin: 0.7 mg/dL (ref 0.3–1.2)

## 2016-10-06 LAB — BLOOD GAS, ARTERIAL
ACID-BASE EXCESS: 0.8 mmol/L (ref 0.0–2.0)
BICARBONATE: 24.4 mmol/L (ref 20.0–28.0)
DRAWN BY: 470591
FIO2: 21
O2 Saturation: 98.3 %
PCO2 ART: 35.2 mmHg (ref 32.0–48.0)
Patient temperature: 98.6
pH, Arterial: 7.454 — ABNORMAL HIGH (ref 7.350–7.450)
pO2, Arterial: 123 mmHg — ABNORMAL HIGH (ref 83.0–108.0)

## 2016-10-06 LAB — URINALYSIS, ROUTINE W REFLEX MICROSCOPIC
BILIRUBIN URINE: NEGATIVE
Glucose, UA: NEGATIVE mg/dL
HGB URINE DIPSTICK: NEGATIVE
KETONES UR: NEGATIVE mg/dL
Nitrite: NEGATIVE
PH: 6 (ref 5.0–8.0)
Protein, ur: NEGATIVE mg/dL
Specific Gravity, Urine: 1.009 (ref 1.005–1.030)

## 2016-10-06 LAB — VAS US DOPPLER PRE CABG
LCCADDIAS: -10 cm/s
LCCAPSYS: 85 cm/s
LEFT ECA DIAS: -8 cm/s
LEFT VERTEBRAL DIAS: -7 cm/s
LICADSYS: -51 cm/s
Left CCA dist sys: -102 cm/s
Left CCA prox dias: 7 cm/s
Left ICA dist dias: -13 cm/s
Left ICA prox dias: 8 cm/s
Left ICA prox sys: 44 cm/s
RCCAPDIAS: 13 cm/s
RIGHT ECA DIAS: -14 cm/s
RIGHT VERTEBRAL DIAS: -14 cm/s
Right CCA prox sys: 84 cm/s

## 2016-10-06 LAB — CBC
HCT: 34.2 % — ABNORMAL LOW (ref 39.0–52.0)
Hemoglobin: 11.5 g/dL — ABNORMAL LOW (ref 13.0–17.0)
MCH: 30.1 pg (ref 26.0–34.0)
MCHC: 33.6 g/dL (ref 30.0–36.0)
MCV: 89.5 fL (ref 78.0–100.0)
Platelets: 210 10*3/uL (ref 150–400)
RBC: 3.82 MIL/uL — ABNORMAL LOW (ref 4.22–5.81)
RDW: 14.5 % (ref 11.5–15.5)
WBC: 10.3 10*3/uL (ref 4.0–10.5)

## 2016-10-06 LAB — PROTIME-INR
INR: 1
PROTHROMBIN TIME: 13.1 s (ref 11.4–15.2)

## 2016-10-06 LAB — SURGICAL PCR SCREEN
MRSA, PCR: NEGATIVE
STAPHYLOCOCCUS AUREUS: POSITIVE — AB

## 2016-10-06 LAB — HEMOGLOBIN A1C
HEMOGLOBIN A1C: 7.2 % — AB (ref 4.8–5.6)
Mean Plasma Glucose: 159.94 mg/dL

## 2016-10-06 LAB — GLUCOSE, CAPILLARY: Glucose-Capillary: 191 mg/dL — ABNORMAL HIGH (ref 65–99)

## 2016-10-06 LAB — APTT: APTT: 29 s (ref 24–36)

## 2016-10-06 NOTE — Progress Notes (Signed)
[address]       [place] [address]             [phone]     CARDIOTHORACIC SURGERY OFFICE NOTE  Referring Provider is Adrian Prows, MD PCP is Cox, Elnita Maxwell, MD   HPI:  Patient is an 83 year old male with history of hypertension, chronic diastolic congestive heart failure, stage IV chronic kidney disease, hyperlipidemia, recurrent paroxysmal atrial fibrillation on long-term anticoagulation, bronchiectasis with chronic dyspnea on exertion, COPD with remote history of tobacco use, asthma, rheumatoid arthritis, type 2 diabetes mellitus, and anemia who returns to the office today for follow-up of left main coronary artery disease with tentative plans to proceed with coronary artery bypass grafting and Maze procedure later this week. He was seen in consultation during his recent hospitalization on 10/01/2016.  Since hospital discharge the patient reports that he has remained clinically stable. He states that he did have one episode of substernal chest pressure over the weekend which was very brief and resolved within a few minutes. He gets short of breath with low-level activity. Symptoms remained quite stable. He reports no new problems or complaints.  He has not been taking Eliquis since hospital discharge.   Current Outpatient Prescriptions  Medication Sig Dispense Refill  . Abatacept (ORENCIA IV) Inject into the vein every 30 (thirty) days. Infusion due again October 2018    . albuterol (PROVENTIL) (2.5 MG/3ML) 0.083% nebulizer solution Take 3 mLs (2.5 mg total) by nebulization every 6 (six) hours as needed for wheezing or shortness of breath. 360 mL 11  . aspirin 81 MG chewable tablet Chew 1 tablet (81 mg total) by mouth daily.    [name] atorvastatin (LIPITOR) 40 MG tablet Take 40 mg by mouth daily.    . ferrous sulfate 325 (65 FE) MG tablet Take 325 mg by mouth daily with breakfast.     . finasteride (PROSCAR) 5 MG tablet Take 5 mg by mouth at bedtime.     .  fluticasone furoate-vilanterol (BREO ELLIPTA) 100-25 MCG/INH AEPB Inhale 1 puff into the lungs daily. 60 each 5  . folic acid (FOLVITE) 1 MG tablet Take 1 mg by mouth daily.      . furosemide (LASIX) 80 MG tablet Take 80 mg by mouth 2 (two) times daily.    [name] gabapentin (NEURONTIN) 300 MG capsule Take 1 capsule (300 mg total) by mouth 2 (two) times daily. 15 capsule 0  . guaiFENesin (MUCINEX) 600 MG 12 hr tablet Take 1,200 mg by mouth 2 (two) times daily.     [name] HYDROcodone-acetaminophen (NORCO) 5-325 MG per tablet Take 1 tablet by mouth every 6 (six) hours as needed for moderate pain. (Patient taking differently: Take 1 tablet by mouth every 6 (six) hours as needed for moderate pain. [facility]) 30 tablet 0  . Insulin Glargine (TOUJEO SOLOSTAR) 300 UNIT/ML SOPN Inject 14 Units into the skin at bedtime.     . isosorbide-hydrALAZINE (BIDIL) 20-37.5 MG tablet Take 1 tablet by mouth 3 (three) times daily.    [name] leflunomide (ARAVA) 20 MG tablet Take 20 mg by mouth daily.     [name] levothyroxine (SYNTHROID, LEVOTHROID) 25 MCG tablet Take 25 mcg by mouth daily before breakfast.    . metoprolol (LOPRESSOR) 50 MG tablet Take 25 mg by mouth 2 (two) times daily.     . ranitidine (ZANTAC) 150 MG tablet Take 150 mg by mouth 2 (two) times daily.      [name]  Tamsulosin HCl (FLOMAX) 0.4 MG CAPS Take 0.4 mg by mouth at bedtime.     . traZODone (DESYREL) 50 MG tablet Take 50 mg by mouth at bedtime as needed for sleep.     No current facility-administered medications for this visit.       Physical Exam:   BP (!) 98/59 (BP Location: Right Arm, Patient Position: Sitting, Cuff Size: Large)   Pulse 74   Resp 16   Ht 5\' 8"  (1.727 m)   Wt 212 lb (96.2 kg)   SpO2 98% Comment: ON RA  BMI 32.23 kg/m   General:  Well-appearing  Chest:   Clear to auscultation with slightly diminished breath sounds left lung base  CV:   Regular rate and rhythm without murmur  Incisions:  n/a  Abdomen:  Soft  nontender  Extremities:  Warm well-perfused, mild lower extremity edema  Diagnostic Tests:  n/a   Impression:  Patient has severe left main coronary artery disease with what appears to be preserved left ventricular systolic function. He presents with a long history of dyspnea on exertion and other symptoms consistent with chronic combined systolic and diastolic congestive heart failure, [place] Heart Association function class III. He denies any history of symptoms of exertional chest pain or chest tightness and there is no history of previous myocardial infarction. Symptoms are unquestionably multifactorial and related to long-standing hypertension with diastolic dysfunction and chronic kidney disease.  I have personally reviewed the patient's diagnostic cardiac catheterization which reveals ostial 80-90% stenosis of the left main coronary artery. There is otherwise diffuse nonobstructive coronary artery disease throughout the remainder of the epicardial coronary vessels. Left ventricular systolic function appears mildly reduced. I agree the patient would benefit from surgical revascularization. Risks associated with surgery will be somewhat elevated because of the patient's age and numerous comorbid medical problems. Nevertheless, the patient seems to be doing reasonably well for a gentleman his age and I do not feel that surgical risks are prohibitive. In addition, he has history of recurrent paroxysmal atrial fibrillation and might benefit from concomitant maze procedure.    Plan:  I have again reviewed the indications, risks, and potential benefits of coronary artery bypass grafting with the patient and his entire family in the office today.  Alternative treatment strategies have been discussed, including the relative risks, benefits and long term prognosis associated with medical therapy, percutaneous coronary intervention, and surgical revascularization.  The patient understands and accepts  all potential associated risks of surgery including but not limited to risk of death, stroke or other neurologic complication, myocardial infarction, congestive heart failure, respiratory failure, renal failure, bleeding requiring blood transfusion and/or reexploration, aortic dissection or other major vascular complication, arrhythmia, heart block or bradycardia requiring permanent pacemaker, pneumonia, pleural effusion, wound infection, pulmonary embolus or other thromboembolic complication, chronic pain or other delayed complications related to median sternotomy, or the late recurrence of symptomatic ischemic heart disease and/or congestive heart failure.  The relative risks and benefits of performing a maze procedure at the time of his surgery was discussed at length, including the expected likelihood of long term freedom from recurrent symptomatic atrial fibrillation and/or atrial flutter.  The importance of long term risk modification have been emphasized.  All questions answered.  We tentatively plan to proceed with surgery on 10/09/2016.  His renal function will be checked later today when he undergoes routine preoperative blood work. The patient has been instructed to continue taking his current medications through Wednesday night. On the morning of  surgery he has been instructed to take only metoprolol, Synthroid, and Zantac with a sip of water.   I spent in excess of 15 minutes during the conduct of this office consultation and >50% of this time involved direct face-to-face encounter with the patient for counseling and/or coordination of their care.    Valentina Gu. Roxy Manns, MD 10/06/2016 12:16 PM

## 2016-10-06 NOTE — Progress Notes (Addendum)
Pre-op Cardiac Surgery  Carotid Findings:  1-39% ICA stenosis.  Vertebral artery flow is antegrade.   Upper Extremity Right Left  Brachial Pressures 128T 114T  Radial Waveforms T T  Ulnar Waveforms B T  Palmar Arch (Allen's Test) WNL WNL   Findings:      Lower  Extremity Right Left  Dorsalis Pedis 148T 129T  Anterior Tibial    Posterior Tibial 142T 174T  Ankle/Brachial Indices 1.16 1.36    Findings:  ABIs and waveforms are within normal limits.   [facility], RVT

## 2016-10-06 NOTE — Pre-Procedure Instructions (Addendum)
[name]  10/06/2016      [id number] - [place], [place] - [place] [address] [place] [address] Phone: [phone] Fax: [phone]    Your procedure is scheduled on Thursday September 27.  Report to [facility] Admitting at 5:30 A.M.  Call this number if you have problems the morning of surgery:  [phone]   Remember:  Do not eat food or drink liquids after midnight.  Take these medicines the morning of surgery with A SIP OF WATER: gabapentin (neurontin), hydrocodone (norco) if needed, isosorbide-hydralazine (BIDIL), levothyrozine (synthroid), metoprolol (lopressor), ranitidine (zantac), BREO Ellipta, albuterol if needed (please bring inhaler to hospital with you)  7 days prior to surgery STOP taking any Aleve, Naproxen, Ibuprofen, Motrin, Advil, Goody's, BC's, all herbal medications, fish oil, and all vitamins  FOLLOW MD's instructions on stopping Aspirin   WHAT DO I DO ABOUT MY DIABETES MEDICATION?   . THE NIGHT BEFORE SURGERY, take 7 units of insulin Glargine (Toujeo)        How to Manage Your Diabetes Before and After Surgery  Why is it important to control my blood sugar before and after surgery? . Improving blood sugar levels before and after surgery helps healing and can limit problems. . A way of improving blood sugar control is eating a healthy diet by: o  Eating less sugar and carbohydrates o  Increasing activity/exercise o  Talking with your doctor about reaching your blood sugar goals . High blood sugars (greater than 180 mg/dL) can raise your risk of infections and slow your recovery, so you will need to focus on controlling your diabetes during the weeks before surgery. . Make sure that the doctor who takes care of your diabetes knows about your planned surgery including the date and location.  How do I manage my blood sugar before surgery? . Check your blood sugar at least 4 times a day,  starting 2 days before surgery, to make sure that the level is not too high or low. o Check your blood sugar the morning of your surgery when you wake up and every 2 hours until you get to the Short Stay unit. . If your blood sugar is less than 70 mg/dL, you will need to treat for low blood sugar: o Do not take insulin. o Treat a low blood sugar (less than 70 mg/dL) with  cup of clear juice (cranberry or apple), 4 glucose tablets, OR glucose gel. o Recheck blood sugar in 15 minutes after treatment (to make sure it is greater than 70 mg/dL). If your blood sugar is not greater than 70 mg/dL on recheck, call [phone] for further instructions. . Report your blood sugar to the short stay nurse when you get to Short Stay.  . If you are admitted to the hospital after surgery: o Your blood sugar will be checked by the staff and you will probably be given insulin after surgery (instead of oral diabetes medicines) to make sure you have good blood sugar levels. o The goal for blood sugar control after surgery is 80-180 mg/dL.                 Do not wear jewelry, make-up or nail polish.  Do not wear lotions, powders, or perfumes, or deoderant.  Do not shave 48 hours prior to surgery.  Men may shave face and neck.  Do not bring valuables to the hospital.  [facility]  is not responsible for any belongings or valuables.  Contacts, dentures or bridgework may not be worn into surgery.  Leave your suitcase in the car.  After surgery it may be brought to your room.  For patients admitted to the hospital, discharge time will be determined by your treatment team.  Patients discharged the day of surgery will not be allowed to drive home.    Special instructions:    [place]- Preparing For Surgery  Before surgery, you can play an important role. Because skin is not sterile, your skin needs to be as free of germs as possible. You can reduce the number of germs on your skin by washing with  CHG (chlorahexidine gluconate) Soap before surgery.  CHG is an antiseptic cleaner which kills germs and bonds with the skin to continue killing germs even after washing.  Please do not use if you have an allergy to CHG or antibacterial soaps. If your skin becomes reddened/irritated stop using the CHG.  Do not shave (including legs and underarms) for at least 48 hours prior to first CHG shower. It is OK to shave your face.  Please follow these instructions carefully.   1. Shower the NIGHT BEFORE SURGERY and the MORNING OF SURGERY with CHG.   2. If you chose to wash your hair, wash your hair first as usual with your normal shampoo.  3. After you shampoo, rinse your hair and body thoroughly to remove the shampoo.  4. Use CHG as you would any other liquid soap. You can apply CHG directly to the skin and wash gently with a scrungie or a clean washcloth.   5. Apply the CHG Soap to your body ONLY FROM THE NECK DOWN.  Do not use on open wounds or open sores. Avoid contact with your eyes, ears, mouth and genitals (private parts). Wash genitals (private parts) with your normal soap.  6. Wash thoroughly, paying special attention to the area where your surgery will be performed.  7. Thoroughly rinse your body with warm water from the neck down.  8. DO NOT shower/wash with your normal soap after using and rinsing off the CHG Soap.  9. Pat yourself dry with a CLEAN TOWEL.   10. Wear CLEAN PAJAMAS   11. Place CLEAN SHEETS on your bed the night of your first shower and DO NOT SLEEP WITH PETS.    Day of Surgery: Do not apply any deodorants/lotions. Please wear clean clothes to the hospital/surgery center.      Please read over the following fact sheets that you were given. Coughing and Deep Breathing and MRSA Information

## 2016-10-06 NOTE — Progress Notes (Signed)
PCP - [name] Cardiologist - Dr. Einar Gip Pulmonary- Dr Lake Bells  Chest x-ray - 10/01/16 EKG - 10/01/16 ECHO - 10/01/16 Cardiac Cath - 09/30/16  Pt reports CBG to be 130-150 when he checks it at home. Pt reports having chest pain Saturday that went away on its own. Pt seen by Dr. Roxy Manns today prior to PAT appointment and MD aware.   Patient denies shortness of breath, fever, cough and chest pain at PAT appointment   Patient verbalized understanding of instructions that were given to them at the PAT appointment. Patient was also instructed that they will need to review over the PAT instructions again at home before surgery.

## 2016-10-06 NOTE — Patient Instructions (Signed)
   Continue taking all current medications without change through the day before surgery.  Have nothing to eat or drink after midnight the night before surgery.  On the morning of surgery take only Synthroid, Metoprolol and Zantac with a sip of water.

## 2016-10-07 DIAGNOSIS — R0602 Shortness of breath: Secondary | ICD-10-CM | POA: Diagnosis not present

## 2016-10-08 DIAGNOSIS — I480 Paroxysmal atrial fibrillation: Secondary | ICD-10-CM | POA: Diagnosis not present

## 2016-10-08 DIAGNOSIS — I2510 Atherosclerotic heart disease of native coronary artery without angina pectoris: Secondary | ICD-10-CM | POA: Diagnosis not present

## 2016-10-08 DIAGNOSIS — N184 Chronic kidney disease, stage 4 (severe): Secondary | ICD-10-CM | POA: Diagnosis not present

## 2016-10-08 DIAGNOSIS — R0602 Shortness of breath: Secondary | ICD-10-CM | POA: Diagnosis not present

## 2016-10-08 MED ORDER — DEXTROSE 5 % IV SOLN
1.5000 g | INTRAVENOUS | Status: AC
  Administered 2016-10-09: 1.5 g via INTRAVENOUS
  Administered 2016-10-09: .75 g via INTRAVENOUS
  Filled 2016-10-08: qty 1.5

## 2016-10-08 MED ORDER — KENNESTONE BLOOD CARDIOPLEGIA (KBC) MANNITOL SYRINGE (20%, 32ML)
32.0000 mL | Freq: Once | INTRAVENOUS | Status: DC
  Filled 2016-10-08: qty 1

## 2016-10-08 MED ORDER — KENNESTONE BLOOD CARDIOPLEGIA VIAL
13.0000 mL | Freq: Once | Status: DC
  Filled 2016-10-08: qty 1

## 2016-10-08 MED ORDER — TRANEXAMIC ACID (OHS) PUMP PRIME SOLUTION
2.0000 mg/kg | INTRAVENOUS | Status: DC
  Filled 2016-10-08: qty 1.98

## 2016-10-08 MED ORDER — KENNESTONE BLOOD CARDIOPLEGIA VIAL
13.0000 mL | Freq: Once | Status: DC
  Filled 2016-10-08 (×2): qty 1

## 2016-10-08 MED ORDER — DOPAMINE-DEXTROSE 3.2-5 MG/ML-% IV SOLN
0.0000 ug/kg/min | INTRAVENOUS | Status: AC
  Administered 2016-10-09: 3 ug/kg/min via INTRAVENOUS
  Filled 2016-10-08: qty 250

## 2016-10-08 MED ORDER — EPINEPHRINE PF 1 MG/ML IJ SOLN
0.0000 ug/min | INTRAVENOUS | Status: DC
  Filled 2016-10-08: qty 4

## 2016-10-08 MED ORDER — VANCOMYCIN HCL 1000 MG IV SOLR
INTRAVENOUS | Status: AC
  Administered 2016-10-09: 1000 mL
  Filled 2016-10-08: qty 1000

## 2016-10-08 MED ORDER — SODIUM CHLORIDE 0.9 % IV SOLN
INTRAVENOUS | Status: DC
  Filled 2016-10-08: qty 30

## 2016-10-08 MED ORDER — DEXTROSE 5 % IV SOLN
750.0000 mg | INTRAVENOUS | Status: DC
  Filled 2016-10-08: qty 750

## 2016-10-08 MED ORDER — MAGNESIUM SULFATE 50 % IJ SOLN
40.0000 meq | INTRAMUSCULAR | Status: DC
  Filled 2016-10-08: qty 10

## 2016-10-08 MED ORDER — SODIUM CHLORIDE 0.9 % IV SOLN
INTRAVENOUS | Status: AC
  Administered 2016-10-09: 1.7 [IU]/h via INTRAVENOUS
  Filled 2016-10-08: qty 1

## 2016-10-08 MED ORDER — NITROGLYCERIN IN D5W 200-5 MCG/ML-% IV SOLN
2.0000 ug/min | INTRAVENOUS | Status: DC
  Filled 2016-10-08: qty 250

## 2016-10-08 MED ORDER — VANCOMYCIN HCL 10 G IV SOLR
1500.0000 mg | INTRAVENOUS | Status: AC
  Administered 2016-10-09: 1500 mg via INTRAVENOUS
  Filled 2016-10-08 (×2): qty 1500

## 2016-10-08 MED ORDER — PLASMA-LYTE 148 IV SOLN
INTRAVENOUS | Status: AC
  Administered 2016-10-09: 500 mL
  Filled 2016-10-08: qty 2.5

## 2016-10-08 MED ORDER — DEXMEDETOMIDINE HCL IN NACL 400 MCG/100ML IV SOLN
0.1000 ug/kg/h | INTRAVENOUS | Status: AC
Start: 2016-10-09 — End: 2016-10-09
  Administered 2016-10-09: .3 ug/kg/h via INTRAVENOUS
  Filled 2016-10-08: qty 100

## 2016-10-08 MED ORDER — POTASSIUM CHLORIDE 2 MEQ/ML IV SOLN
80.0000 meq | INTRAVENOUS | Status: DC
  Filled 2016-10-08: qty 40

## 2016-10-08 MED ORDER — TRANEXAMIC ACID 1000 MG/10ML IV SOLN
1.5000 mg/kg/h | INTRAVENOUS | Status: AC
  Administered 2016-10-09: 1.5 mg/kg/h via INTRAVENOUS
  Filled 2016-10-08: qty 25

## 2016-10-08 MED ORDER — SODIUM CHLORIDE 0.9 % IV SOLN
30.0000 ug/min | INTRAVENOUS | Status: AC
  Administered 2016-10-09: 10 ug/min via INTRAVENOUS
  Filled 2016-10-08: qty 2

## 2016-10-08 MED ORDER — TRANEXAMIC ACID (OHS) BOLUS VIA INFUSION
15.0000 mg/kg | INTRAVENOUS | Status: AC
  Administered 2016-10-09: 1485 mg via INTRAVENOUS
  Filled 2016-10-08: qty 1485

## 2016-10-08 NOTE — H&P (Signed)
[address]       [place] [address]             [phone]          CARDIOTHORACIC SURGERY HISTORY AND PHYSICAL EXAM  PCP is Cox, Kirsten, MD Referring Provider is Adrian Prows, MD  Reason for consultation:  Left Main Coronary Artery Disease  HPI:  Patient is an 83 year old male with history of hypertension, chronic diastolic congestive heart failure, stage IV chronic kidney disease, hyperlipidemia, recurrent paroxysmal atrial fibrillation on long-term anticoagulation, bronchiectasis with chronic dyspnea on exertion, COPD with remote history of tobacco use, asthma, rheumatoid arthritis, type 2 diabetes mellitus, and anemia who has been referred for surgical consultation to discuss treatment options for management of left main coronary artery disease.  The patient has no previous history of coronary artery disease.  He has history of paroxysmal atrial fibrillation dating back several years for which she has been followed by Dr. Einar Gip.  The patient states that he first began to experience significant exertional shortness of breath several years ago.  In 2015 an echocardiogram was performed demonstrating severe diastolic dysfunction.  Noninvasive stress testing revealed resting ejection fraction 44%. No signs of ischemia. Medical therapy was adjusted. In November 2017 he was hospitalized with acute exacerbation of symptoms of dyspnea on exertion, orthopnea, and lower extremity edema. Immediately prior to admission he had run out of his diuretic medications.  Symptoms improved with diuresis.  Over the past year the patient states that symptoms have progressed such that he now gets short of breath with very low level activity and occasionally at rest. He has episodes of orthopnea without PND. He has had chronic lower extremity edema.  He was subsequently scheduled for elective diagnostic cardiac catheterization which was performed yesterday evening and notable for 90% stenosis  of the left main coronary artery.  Cardiothoracic surgical consultation was requested.  The patient is married and lives locally in [place] with his wife. 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 He states that his primary physical limitation is that of exertional shortness of breath. He does have long-standing rheumatoid arthritis which affects his mobility to some degree, but he is able to ambulate without using a cane or walker. He states that he cannot do much of anything without getting short of breath. He has never had any chest pain or chest tightness. He has not had dizzy spells or syncope. He has history of paroxysmal atrial fibrillation but no history of palpitations and he cannot tell whether or not he is in or out of rhythm. He has been anticoagulated for several years using Eliquis without any bleeding complications. He has no history of TIA or stroke. He has long-standing type 2 diabetes mellitus for which he is now using insulin. He claims that glycemic control has been good.  Patient is an 83 year old male with history of hypertension, chronic diastolic congestive heart failure, stage IV chronic kidney disease, hyperlipidemia, recurrent paroxysmal atrial fibrillation on long-term anticoagulation, bronchiectasis with chronic dyspnea on exertion, COPD with remote history of tobacco use, asthma, rheumatoid arthritis, type 2 diabetes mellitus, and anemia who returns to the office today for follow-up of left main coronary artery disease with tentative plans to proceed with coronary artery bypass grafting and Maze procedure later this  week. He was seen in consultation during his recent hospitalization on 10/01/2016.  Since hospital discharge the patient reports that he has remained clinically stable. He states that he  did have one episode of substernal chest pressure over the weekend which was very brief and resolved within a few minutes. He gets short of breath with low-level activity. Symptoms remained quite stable. He reports no new problems or complaints.  He has not been taking Eliquis since hospital discharge.       Past Medical History:  Diagnosis Date  . Anemia   . Atrial fibrillation ([facility]) 12/08/2013  . Cataract   . CHF (congestive heart failure) ([facility])   . Chronic kidney disease   . Constipation    takes stool softener daily  . Dysrhythmia    a-fib  . GERD (gastroesophageal reflux disease)    takes Zantac daily   . History of blood transfusion    "related to one of my ORs"  . Hyperlipidemia    takes Pravastatin daily  . Hypertension    takes Cardizem,Proscar,and Lisinopril daily  . Hypothyroidism   . Impaired hearing    wears hearing aids  . Joint pain   . Joint swelling   . Peripheral neuropathy   . Rheumatoid arthritis ([facility])    "all over; take orencia  q 4 wks" (02/01/2013)  . Type II diabetes mellitus ([facility])    takes Metformin daily         Past Surgical History:  Procedure Laterality Date  . AMPUTATION TOE Left 11/08/2015   Procedure: LEFT HALLUX AMPUTATION AT THE METATARSOPHALANGEAL JOINT;  Surgeon: Wylene Simmer, MD;  Location: [facility];  Service: Orthopedics;  Laterality: Left;  . ANKLE FUSION Left 2014  . BIOPSY THYROID  2014  . CARPAL TUNNEL RELEASE Bilateral 1990's  . CATARACT EXTRACTION W/ INTRAOCULAR LENS  IMPLANT, BILATERAL Bilateral 1990's  . COLONOSCOPY    . FOREIGN BODY REMOVAL Right 09/06/2013   Procedure: REMOVAL FOREIGN BODY RIGHT INDEX FINGER;  Surgeon: Daryll Brod, MD;  Location: [facility];  Service: Orthopedics;  Laterality: Right;  ANESTHESIA: IV REGIONAL FAB  . REPAIR TENDONS FOOT Left 1946; 1984   "farming accident; repaired tendons both times"  . RIGHT/LEFT HEART CATH AND CORONARY  ANGIOGRAPHY N/A 09/30/2016   Procedure: RIGHT/LEFT HEART CATH AND CORONARY ANGIOGRAPHY;  Surgeon: Nigel Mormon, MD;  Location: [facility];  Service: Cardiovascular;  Laterality: N/A;  . SHOULDER ARTHROSCOPY W/ ROTATOR CUFF REPAIR Right 1990's  . THYROIDECTOMY Right 02/01/2013   Procedure: RIGHT THYROIDECTOMY LOBECTOMY;  Surgeon: Earnstine Regal, MD;  Location: [facility];  Service: General;  Laterality: Right;  . THYROIDECTOMY, PARTIAL Right 02/01/2013  . TONSILLECTOMY  1930's  . TOTAL KNEE ARTHROPLASTY Right 12/16/2010   Procedure: TOTAL KNEE ARTHROPLASTY; rt Surgeon: Rudean Haskell, MD;  Location: [facility];  Service: Orthopedics;  Laterality: Right;  . TOTAL SHOULDER REPLACEMENT Left 03/2010         Family History  Problem Relation Age of Onset  . Cancer Mother        breast  . Aneurysm Father     Social History        Social History  . Marital status: Married    Spouse name: N/A  . Number of children: N/A  . Years of education: N/A      Occupational History  . Not on file.         Social History Main Topics  . Smoking  status: Former Smoker    Packs/day: 1.00    Years: 15.00    Types: Cigarettes    Quit date: 09/01/1965  . Smokeless tobacco: Never Used     Comment: quit smoking in 1967  . Alcohol use No     Comment: 02/01/2013 "stopped drinking in 1967; about an alcoholic when I quit"  . Drug use: No  . Sexual activity: No       Other Topics Concern  . Not on file      Social History Narrative  . No narrative on file           Prior to Admission medications   Medication Sig Start Date End Date Taking? Authorizing Provider  Abatacept (ORENCIA IV) Inject into the vein every 30 (thirty) days.   Yes [provider]  albuterol (PROVENTIL) (2.5 MG/3ML) 0.083% nebulizer solution Take 3 mLs (2.5 mg total) by nebulization every 6 (six) hours as needed for wheezing or shortness of breath. 07/01/16  Yes Juanito Doom, MD  atorvastatin (LIPITOR) 40 MG tablet Take 40 mg by mouth daily.   Yes [provider]  ELIQUIS 2.5 MG TABS tablet Take 2.5 mg by mouth 2 (two) times daily. 01/22/14  Yes [provider]  ferrous sulfate 325 (65 FE) MG tablet Take 325 mg by mouth daily with breakfast.    Yes [provider]  finasteride (PROSCAR) 5 MG tablet Take 5 mg by mouth at bedtime.    Yes [provider]  fluticasone furoate-vilanterol (BREO ELLIPTA) 100-25 MCG/INH AEPB Inhale 1 puff into the lungs daily. 01/01/16  Yes Parrett, Tammy S, NP  folic acid (FOLVITE) 1 MG tablet Take 1 mg by mouth daily.     Yes [provider]  furosemide (LASIX) 80 MG tablet Take 80 mg by mouth 2 (two) times daily.   Yes [provider]  gabapentin (NEURONTIN) 300 MG capsule Take 1 capsule (300 mg total) by mouth 2 (two) times daily. 12/13/13  Yes Milagros Loll, MD  guaiFENesin (MUCINEX) 600 MG 12 hr tablet Take 1,200 mg by mouth 2 (two) times daily as needed for cough or to loosen phlegm.   Yes [provider]  HYDROcodone-acetaminophen (NORCO) 5-325 MG per tablet Take 1 tablet by mouth every 6 (six) hours as needed for moderate pain. 09/06/13  Yes Daryll Brod, MD  Insulin Glargine (TOUJEO SOLOSTAR) 300 UNIT/ML SOPN Inject 14 Units into the skin at bedtime.    Yes [provider]  isosorbide-hydrALAZINE (BIDIL) 20-37.5 MG tablet Take 1 tablet by mouth 3 (three) times daily.   Yes [provider]  leflunomide (ARAVA) 20 MG tablet Take 20 mg by mouth daily.  05/07/16  Yes [provider]  levothyroxine (SYNTHROID, LEVOTHROID) 25 MCG tablet Take 25 mcg by mouth daily before breakfast.   Yes [provider]  metoprolol (LOPRESSOR) 50 MG tablet Take 25 mg by mouth daily.    Yes [provider]  ranitidine (ZANTAC) 150 MG tablet Take 150 mg by mouth 2 (two) times daily.     Yes [provider]  Tamsulosin  HCl (FLOMAX) 0.4 MG CAPS Take 0.4 mg by mouth at bedtime.    Yes [provider]  traZODone (DESYREL) 50 MG tablet Take 50 mg by mouth at bedtime as needed for sleep.   Yes [provider]             Current Facility-Administered Medications  Medication Dose Route Frequency Provider Last Rate  Last Dose  . 0.9 %  sodium chloride infusion  250 mL Intravenous PRN Adrian Prows, MD      . 0.9 %  sodium chloride infusion  250 mL Intravenous PRN Patwardhan, Manish J, MD      . acetaminophen (TYLENOL) tablet 650 mg  650 mg Oral Q4H PRN Patwardhan, Manish J, MD      . albuterol (PROVENTIL) (2.5 MG/3ML) 0.083% nebulizer solution 2.5 mg  2.5 mg Nebulization Q6H PRN Patwardhan, Manish J, MD      . aspirin chewable tablet 81 mg  81 mg Oral Daily Patwardhan, Manish J, MD      . atorvastatin (LIPITOR) tablet 40 mg  40 mg Oral Daily Patwardhan, Manish J, MD   40 mg at 09/30/16 2142  . finasteride (PROSCAR) tablet 5 mg  5 mg Oral QHS Patwardhan, Manish J, MD   5 mg at 09/30/16 2142  . fluticasone furoate-vilanterol (BREO ELLIPTA) 100-25 MCG/INH 1 puff  1 puff Inhalation Daily Patwardhan, Manish J, MD      . folic acid (FOLVITE) tablet 1 mg  1 mg Oral Daily Patwardhan, Manish J, MD      . gabapentin (NEURONTIN) capsule 300 mg  300 mg Oral BID Patwardhan, Manish J, MD   300 mg at 09/30/16 2142  . guaiFENesin (MUCINEX) 12 hr tablet 1,200 mg  1,200 mg Oral BID PRN Patwardhan, Manish J, MD   1,200 mg at 09/30/16 2142  . heparin ADULT infusion 100 units/mL (25000 units/227mL sodium chloride 0.45%)  1,100 Units/hr Intravenous Continuous Patwardhan, Manish J, MD 11 mL/hr at 10/01/16 0815 1,100 Units/hr at 10/01/16 0815  . HYDROcodone-acetaminophen (NORCO/VICODIN) 5-325 MG per tablet 1 tablet  1 tablet Oral Q6H PRN Patwardhan, Manish J, MD      . insulin aspart (novoLOG) injection 0-20 Units  0-20 Units Subcutaneous TID WC Patwardhan, Reynold Bowen, MD   4 Units at 10/01/16 0731  . insulin glargine  (LANTUS) injection 10 Units  10 Units Subcutaneous QHS Nigel Mormon, MD   10 Units at 09/30/16 2215  . isosorbide-hydrALAZINE (BIDIL) 20-37.5 MG per tablet 1 tablet  1 tablet Oral TID Nigel Mormon, MD   1 tablet at 09/30/16 2142  . leflunomide (ARAVA) tablet 20 mg  20 mg Oral Daily Patwardhan, Manish J, MD      . levothyroxine (SYNTHROID, LEVOTHROID) tablet 25 mcg  25 mcg Oral QAC breakfast Patwardhan, Reynold Bowen, MD   25 mcg at 10/01/16 [phone]  . metoprolol tartrate (LOPRESSOR) tablet 25 mg  25 mg Oral Daily Patwardhan, Manish J, MD   25 mg at 09/30/16 2143  . ondansetron (ZOFRAN) injection 4 mg  4 mg Intravenous Q6H PRN Patwardhan, Manish J, MD      . sodium chloride flush (NS) 0.9 % injection 3 mL  3 mL Intravenous Q12H Patwardhan, Manish J, MD   3 mL at 09/30/16 2330  . sodium chloride flush (NS) 0.9 % injection 3 mL  3 mL Intravenous PRN Patwardhan, Manish J, MD      . tamsulosin (FLOMAX) capsule 0.4 mg  0.4 mg Oral QHS Patwardhan, Manish J, MD   0.4 mg at 09/30/16 2142  . traZODone (DESYREL) tablet 50 mg  50 mg Oral QHS PRN Patwardhan, Manish J, MD   50 mg at 09/30/16 2340    No Known Allergies    Review of Systems:              General:  normal appetite, decreased energy, no weight gain, no weight loss, no fever             Cardiac:                       no chest pain with exertion, no chest pain at rest, + SOB with exertion, occasional resting SOB, no PND, + orthopnea, no palpitations, + arrhythmia, + atrial fibrillation, + LE edema, no dizzy spells, no syncope             Respiratory:                 + shortness of breath, no home oxygen, no productive cough, + chronic dry cough, no bronchitis, no wheezing, no hemoptysis, + asthma, no pain with inspiration or cough, no sleep apnea, no CPAP at night             GI:                               no difficulty swallowing, no reflux, no frequent heartburn, no hiatal hernia, no abdominal pain, no  constipation, no diarrhea, no hematochezia, no hematemesis, no melena             GU:                              no dysuria,  no frequency, no urinary tract infection, no hematuria, no enlarged prostate, no kidney stones, + kidney disease             Vascular:                     no pain suggestive of claudication, no pain in feet, no leg cramps, no varicose veins, no DVT, no non-healing foot ulcer             Neuro:                         no stroke, no TIA's, no seizures, no headaches, no temporary blindness one eye,  no slurred speech, + peripheral neuropathy, + chronic pain, mild instability of gait, no memory/cognitive dysfunction             Musculoskeletal:         + arthritis, + joint swelling, no myalgias, no difficulty walking, somewhat decreased mobility              Skin:                            no rash, no itching, no skin infections, no pressure sores or ulcerations             Psych:                         no anxiety, no depression, no nervousness, no unusual recent stress             Eyes:                           no blurry vision, no floaters, no recent vision changes, + wears glasses or contacts             ENT:  no hearing loss, edentulous with full set dentures,             Hematologic:               + easy bruising, no abnormal bleeding, no clotting disorder, no frequent epistaxis             Endocrine:                   + diabetes, does check CBG's at home                           Physical Exam:              BP 126/66   Pulse 68   Temp (!) 97.5 F (36.4 C) (Oral)   Resp 16   Ht 5\' 9"  (1.753 m)   Wt 219 lb 2.2 oz (99.4 kg)   SpO2 95%   BMI 32.36 kg/m              General:                      Moderately obese,  well-appearing             HEENT:                       Unremarkable              Neck:                           no JVD, no bruits, no adenopathy              Chest:                          clear to auscultation,  symmetrical breath sounds, no wheezes, no rhonchi              CV:                              RRR, no  murmur              Abdomen:                    soft, non-tender, no masses              Extremities:                 warm, well-perfused, pulses diminished but palpable, mild lower extremity edema             Rectal/GU                   Deferred             Neuro:                         Grossly non-focal and symmetrical throughout             Skin:                            Clean and dry, no rashes, no breakdown  Diagnostic Tests:  RIGHT/LEFT HEART CATH AND CORONARY ANGIOGRAPHY  Conclusion  Ostial 90% calcified left main stenosis. 50% proximal LAD calcified stenosis. Rest mild nonobstructive disease. Mild pulmonary hypertension.  CT surgery consultation regarding CABG Patient will be admitted to telemetry overnight.  Nigel Mormon, MD [facility] Pager: [phone] Office: [phone] If no answer Cell [phone]     Indications   Shortness of breath on exertion [R06.02 (ICD-10-CM)]  Abnormal stress test [R94.39 (ICD-10-CM)]  Procedural Details/Technique   Technical Details Indication: Exertional shortness of breath Intermediate risk stress test  History:  83 y/o Caucasian male with paroxysmal atrial fibrillation, history of rheumatoid arthritis, stage IV chronic kidney disease, hypertension and mild hyperlipidemia presents here for follow-up of dyspnea on exertion. He was found to have lower LVEF with severe LV systolic dysfunction in 7616 and was aggressively treated with medical therapy without angiography due to renal failure but had undergone stress testing which had revealed decreased EF at 44% but no ischemia.  Over the past 4-5 months, due to worsening symptoms of dyspnea, class 3-4, patient unable to do activity of daily living without limitation. He saw Dr. Lake Bells and underwent evaluation that showed stable PFTs and CT  scan. It was recommended that he follow-up with [place] for further evaluation. I saw him on 08/19/2016, underwent nuclear stress test and echocardiogram and presents for follow-up. No change in symptoms.  He is been maintaining sinus rhythm and he is oriented correlation with Eliquis without bleeding diathesis. Has chronic mild fatigue and. Denies leg edema, PND or orthopnea. No chest pain. States that his diabetes is much improved.  Lexiscan myoview stress test 08/25/2016: 1. The patient had myocardial perfusion imaging performed using a rest Tc-102m/stressTc-52m one day protocol. 2. The resting electrocardiogram demonstrated normal sinus rhythm, normal resting conduction, no resting arrhythmias and normal rest repolarization. Stress EKG is non diagnostic for ischemia as it is a pharmacologic stress. However, incomplete left bundle branch block and ventricular bigeminy was noted during stress and recover phase. Stress symptoms included dyspnea, dizziness. 3. Left ventricular cavity is noted to be normal on the rest and stress studies. The left ventricular ejection fraction was calculated to be 34%. This may be due to incomplete left bundle branch block and ventricular bigeminy noted during stress and recover phase.SPECT images demonstrate Medium perfusion abnormality of mild intensity in the basal inferolateral, mid inferior and mid inferolateral myocardial wall(s) on the stress images. The defect partially improves with stress, suggesting soft tissue attenuation. Small area of ischemia cannot be excluded. 4. This is an intermediate risk study.   Echocardiogram 08/21/2016: 1. Left ventricle cavity is normal in size. Moderate concentric hypertrophy of the left ventricle. Normal global wall motion. Visual EF is 55-60%. 2. Mild (Grade I) mitral regurgitation. 3. Mild tricuspid regurgitation. Pulmonary artery systolic pressure is estimated at 30-35 mm Hg. 4. Moderate pulmonic regurgitation.     Procedures: 1. Left and right heart catheterization 2. Selective left and right coronary angiography  Right AC venous axis Right radial arterial access Right common femoral arterial access  Right heart catheterization performed through the right AC vein using a 4 Fr balloon tipped wedged catheter. Right radial 6 French sheath placed. Significant radial loop encountered which was conquered using a Glidewire. However significant spasm noted. At this point access was switched to right femoral.  5 [place] JL 3.5 catheter used for left carotid artery. Multiple catheterizations for right coronary artery, eventually engaged using a 5 [place] [facility] catheter  Hemostasis: Rt radial artery: TR band Rt common femoral artery: Manual compression Rt AC  vein: Manual compression  No complications   Estimated blood loss <50 mL.  During this procedure the patient was administered the following to achieve and maintain moderate conscious sedation: Versed 1 mg, Fentanyl 25 mcg, while the patient's heart rate, blood pressure, and oxygen saturation were continuously monitored. The period of conscious sedation was 68 minutes, of which I was present face-to-face 100% of this time.    Complications   Complications documented before study signed (09/30/2016 7:37 PM EDT)    RIGHT/LEFT HEART CATH AND CORONARY ANGIOGRAPHY   None Documented by Nigel Mormon, MD 09/30/2016 7:36 PM EDT  Time Range: Intra-procedure      Coronary Findings   Dominance: Right  Left Main  LM lesion, 90% stenosed. The lesion is calcified.  Left Anterior Descending  Ost LAD to Prox LAD lesion, 50% stenosed. The lesion is calcified.  Prox LAD to Mid LAD lesion, 50% stenosed. The lesion is calcified.  Right Coronary Artery  Vessel is moderate in size.  Prox RCA lesion, 30% stenosed.  Mid RCA lesion, 30% stenosed.  Right Posterior Descending Artery  Vessel is small in size.  RPDA lesion, 50% stenosed.  Right  Heart   Right Heart Pressures Hemodynamic findings consistent with pulmonary hypertension. Elevated LV EDP consistent with volume overload. Mild pulmonary hypertension    Coronary Diagrams   Diagnostic Diagram       Implants        No implant documentation for this case.  PACS Images   Show images for CARDIAC CATHETERIZATION   Link to Procedure Log   Procedure Log    Hemo Data    Most Recent Value  RA A Wave 5 mmHg  RA V Wave 4 mmHg  RA Mean 4 mmHg  RV Systolic Pressure 38 mmHg  RV Diastolic Pressure 0 mmHg  RV EDP 5 mmHg  PA Systolic Pressure 36 mmHg  PA Diastolic Pressure 12 mmHg  PA Mean 21 mmHg  PW A Wave 13 mmHg  PW V Wave 19 mmHg  PW Mean 13 mmHg  AO Systolic Pressure 409 mmHg  AO Diastolic Pressure 69 mmHg  AO Mean 811 mmHg  LV Systolic Pressure 914 mmHg  LV Diastolic Pressure 3 mmHg  LV EDP 14 mmHg  Arterial Occlusion Pressure Extended Systolic Pressure 782 mmHg  Arterial Occlusion Pressure Extended Diastolic Pressure 68 mmHg  Arterial Occlusion Pressure Extended Mean Pressure 99 mmHg  Left Ventricular Apex Extended Systolic Pressure 956 mmHg  Left Ventricular Apex Extended Diastolic Pressure 1 mmHg  Left Ventricular Apex Extended EDP Pressure 10 mmHg    STS Risk Calculator Procedure: CAB Only  Risk of Mortality: 11.229%  Morbidity or Mortality: 45.775%  Long Length of Stay: 32.165%  Short Length of Stay: 6.437%  Permanent Stroke: 2.087%  Prolonged Ventilation: 24.554%  DSW Infection: 1.51%  Renal Failure: 30.265%  Reoperation: 15.93%      Impression:  Patient has severe left main coronary artery disease with what appears to be preserved left ventricular systolic function. He presents with a long history of dyspnea on exertion and other symptoms consistent with chronic combined systolic and diastolic congestive heart failure, [place] Heart Association function class III. He denies any history of symptoms of exertional chest  pain or chest tightness and there is no history of previous myocardial infarction. Symptoms are unquestionably multifactorial and related to long-standing hypertension withdiastolic dysfunction and chronic kidney disease. I have personally reviewed the patient's diagnostic cardiac catheterization which reveals ostial 80-90% stenosis of the left  main coronary artery. There is otherwise diffuse nonobstructive coronary artery disease throughout the remainder of the epicardial coronary vessels. Left ventricular systolic function appears mildly reduced. I agree the patient would benefit from surgical revascularization. Risks associated with surgery will be somewhat elevated because of the patient's age and numerous comorbid medical problems. Nevertheless, the patient seems to be doing reasonably well for a gentleman his age and I do not feel that surgical risks are prohibitive. In addition, he has history of recurrent paroxysmal atrial fibrillation and might benefit from concomitant maze procedure.    Plan:  I have again reviewed the indications, risks, and potential benefits of coronary artery bypass grafting with the patient and his entire family in the office today. Alternative treatment strategies have been discussed, including the relative risks, benefits and long term prognosis associated with medical therapy, percutaneous coronary intervention, and surgical revascularization. The patient understands and accepts all potential associated risks of surgery including but not limited to risk of death, stroke or other neurologic complication, myocardial infarction, congestive heart failure, respiratory failure, renal failure, bleeding requiring blood transfusion and/or reexploration, aortic dissection or other major vascular complication, arrhythmia, heart block or bradycardia requiring permanent pacemaker, pneumonia, pleural effusion, wound infection, pulmonary embolus or other thromboembolic complication,  chronic pain or other delayed complications related to median sternotomy, or the late recurrence of symptomatic ischemic heart disease and/or congestive heart failure. The relative risks and benefits of performing a maze procedure at the time of hissurgery was discussed at length, including the expected likelihood of long term freedom from recurrent symptomatic atrial fibrillation and/or atrial flutter. The importance of long term risk modification have been emphasized. All questions answered. We tentatively plan to proceed with surgery on 10/09/2016.  His renal function will be checked later today when he undergoes routine preoperative blood work. The patient has been instructed to continue taking his current medications through Wednesday night. On the morning of surgery he has been instructed to take only metoprolol, Synthroid, and Zantac with a sip of water.   I spent in excess of 15 minutes during the conduct of this office consultation and >50% of this time involved direct face-to-face encounter with the patient for counseling and/or coordination of their care.    Valentina Gu. Roxy Manns, MD 10/06/2016 12:16 PM

## 2016-10-09 ENCOUNTER — Encounter (HOSPITAL_COMMUNITY): Payer: Self-pay | Admitting: Certified Registered Nurse Anesthetist

## 2016-10-09 ENCOUNTER — Inpatient Hospital Stay (HOSPITAL_COMMUNITY): Payer: Medicare Other | Admitting: Emergency Medicine

## 2016-10-09 ENCOUNTER — Inpatient Hospital Stay (HOSPITAL_COMMUNITY): Payer: Medicare Other

## 2016-10-09 ENCOUNTER — Inpatient Hospital Stay (HOSPITAL_COMMUNITY)
Admission: RE | Admit: 2016-10-09 | Discharge: 2016-11-03 | DRG: 228 | Disposition: A | Discharge status: Rehabilitation Facility | Payer: Medicare Other | Source: Ambulatory Visit | Attending: Thoracic Surgery (Cardiothoracic Vascular Surgery) | Admitting: Thoracic Surgery (Cardiothoracic Vascular Surgery)

## 2016-10-09 ENCOUNTER — Inpatient Hospital Stay (HOSPITAL_COMMUNITY): Payer: Medicare Other | Admitting: Certified Registered Nurse Anesthetist

## 2016-10-09 ENCOUNTER — Encounter (HOSPITAL_COMMUNITY)
Admission: RE | Disposition: A | Discharge status: Rehabilitation Facility | Payer: Self-pay | Source: Ambulatory Visit | Attending: Thoracic Surgery (Cardiothoracic Vascular Surgery)

## 2016-10-09 DIAGNOSIS — I10 Essential (primary) hypertension: Secondary | ICD-10-CM | POA: Diagnosis present

## 2016-10-09 DIAGNOSIS — N2581 Secondary hyperparathyroidism of renal origin: Secondary | ICD-10-CM | POA: Diagnosis present

## 2016-10-09 DIAGNOSIS — E871 Hypo-osmolality and hyponatremia: Secondary | ICD-10-CM | POA: Diagnosis not present

## 2016-10-09 DIAGNOSIS — D638 Anemia in other chronic diseases classified elsewhere: Secondary | ICD-10-CM

## 2016-10-09 DIAGNOSIS — I4891 Unspecified atrial fibrillation: Secondary | ICD-10-CM | POA: Diagnosis present

## 2016-10-09 DIAGNOSIS — R0609 Other forms of dyspnea: Secondary | ICD-10-CM | POA: Diagnosis present

## 2016-10-09 DIAGNOSIS — E8779 Other fluid overload: Secondary | ICD-10-CM | POA: Diagnosis not present

## 2016-10-09 DIAGNOSIS — H9190 Unspecified hearing loss, unspecified ear: Secondary | ICD-10-CM | POA: Diagnosis present

## 2016-10-09 DIAGNOSIS — E0822 Diabetes mellitus due to underlying condition with diabetic chronic kidney disease: Secondary | ICD-10-CM | POA: Diagnosis not present

## 2016-10-09 DIAGNOSIS — Z96612 Presence of left artificial shoulder joint: Secondary | ICD-10-CM | POA: Diagnosis present

## 2016-10-09 DIAGNOSIS — Z89412 Acquired absence of left great toe: Secondary | ICD-10-CM

## 2016-10-09 DIAGNOSIS — K7200 Acute and subacute hepatic failure without coma: Secondary | ICD-10-CM | POA: Diagnosis not present

## 2016-10-09 DIAGNOSIS — Z09 Encounter for follow-up examination after completed treatment for conditions other than malignant neoplasm: Secondary | ICD-10-CM

## 2016-10-09 DIAGNOSIS — R1310 Dysphagia, unspecified: Secondary | ICD-10-CM | POA: Diagnosis present

## 2016-10-09 DIAGNOSIS — J189 Pneumonia, unspecified organism: Secondary | ICD-10-CM | POA: Diagnosis not present

## 2016-10-09 DIAGNOSIS — Z992 Dependence on renal dialysis: Secondary | ICD-10-CM | POA: Diagnosis not present

## 2016-10-09 DIAGNOSIS — E890 Postprocedural hypothyroidism: Secondary | ICD-10-CM | POA: Diagnosis present

## 2016-10-09 DIAGNOSIS — R110 Nausea: Secondary | ICD-10-CM | POA: Diagnosis not present

## 2016-10-09 DIAGNOSIS — Z87891 Personal history of nicotine dependence: Secondary | ICD-10-CM

## 2016-10-09 DIAGNOSIS — K567 Ileus, unspecified: Secondary | ICD-10-CM | POA: Diagnosis not present

## 2016-10-09 DIAGNOSIS — K219 Gastro-esophageal reflux disease without esophagitis: Secondary | ICD-10-CM | POA: Diagnosis present

## 2016-10-09 DIAGNOSIS — D509 Iron deficiency anemia, unspecified: Secondary | ICD-10-CM | POA: Diagnosis present

## 2016-10-09 DIAGNOSIS — R5381 Other malaise: Secondary | ICD-10-CM | POA: Diagnosis not present

## 2016-10-09 DIAGNOSIS — I5022 Chronic systolic (congestive) heart failure: Secondary | ICD-10-CM | POA: Diagnosis present

## 2016-10-09 DIAGNOSIS — I272 Pulmonary hypertension, unspecified: Secondary | ICD-10-CM | POA: Diagnosis not present

## 2016-10-09 DIAGNOSIS — R7309 Other abnormal glucose: Secondary | ICD-10-CM | POA: Diagnosis not present

## 2016-10-09 DIAGNOSIS — M79644 Pain in right finger(s): Secondary | ICD-10-CM | POA: Diagnosis not present

## 2016-10-09 DIAGNOSIS — J440 Chronic obstructive pulmonary disease with acute lower respiratory infection: Secondary | ICD-10-CM | POA: Diagnosis not present

## 2016-10-09 DIAGNOSIS — N179 Acute kidney failure, unspecified: Secondary | ICD-10-CM | POA: Diagnosis not present

## 2016-10-09 DIAGNOSIS — E162 Hypoglycemia, unspecified: Secondary | ICD-10-CM | POA: Diagnosis not present

## 2016-10-09 DIAGNOSIS — N171 Acute kidney failure with acute cortical necrosis: Secondary | ICD-10-CM | POA: Diagnosis not present

## 2016-10-09 DIAGNOSIS — J9622 Acute and chronic respiratory failure with hypercapnia: Secondary | ICD-10-CM | POA: Diagnosis not present

## 2016-10-09 DIAGNOSIS — R197 Diarrhea, unspecified: Secondary | ICD-10-CM | POA: Diagnosis not present

## 2016-10-09 DIAGNOSIS — Z452 Encounter for adjustment and management of vascular access device: Secondary | ICD-10-CM

## 2016-10-09 DIAGNOSIS — R34 Anuria and oliguria: Secondary | ICD-10-CM | POA: Diagnosis not present

## 2016-10-09 DIAGNOSIS — Z4682 Encounter for fitting and adjustment of non-vascular catheter: Secondary | ICD-10-CM | POA: Diagnosis not present

## 2016-10-09 DIAGNOSIS — J810 Acute pulmonary edema: Secondary | ICD-10-CM | POA: Diagnosis not present

## 2016-10-09 DIAGNOSIS — D631 Anemia in chronic kidney disease: Secondary | ICD-10-CM | POA: Diagnosis present

## 2016-10-09 DIAGNOSIS — Z9889 Other specified postprocedural states: Secondary | ICD-10-CM | POA: Diagnosis not present

## 2016-10-09 DIAGNOSIS — J9690 Respiratory failure, unspecified, unspecified whether with hypoxia or hypercapnia: Secondary | ICD-10-CM | POA: Diagnosis not present

## 2016-10-09 DIAGNOSIS — I119 Hypertensive heart disease without heart failure: Secondary | ICD-10-CM | POA: Diagnosis present

## 2016-10-09 DIAGNOSIS — I5023 Acute on chronic systolic (congestive) heart failure: Secondary | ICD-10-CM | POA: Diagnosis present

## 2016-10-09 DIAGNOSIS — R6521 Severe sepsis with septic shock: Secondary | ICD-10-CM | POA: Diagnosis not present

## 2016-10-09 DIAGNOSIS — J9601 Acute respiratory failure with hypoxia: Secondary | ICD-10-CM

## 2016-10-09 DIAGNOSIS — M069 Rheumatoid arthritis, unspecified: Secondary | ICD-10-CM | POA: Diagnosis present

## 2016-10-09 DIAGNOSIS — I2510 Atherosclerotic heart disease of native coronary artery without angina pectoris: Secondary | ICD-10-CM

## 2016-10-09 DIAGNOSIS — R570 Cardiogenic shock: Secondary | ICD-10-CM

## 2016-10-09 DIAGNOSIS — J9621 Acute and chronic respiratory failure with hypoxia: Secondary | ICD-10-CM | POA: Diagnosis not present

## 2016-10-09 DIAGNOSIS — E213 Hyperparathyroidism, unspecified: Secondary | ICD-10-CM | POA: Diagnosis present

## 2016-10-09 DIAGNOSIS — E119 Type 2 diabetes mellitus without complications: Secondary | ICD-10-CM | POA: Diagnosis not present

## 2016-10-09 DIAGNOSIS — E876 Hypokalemia: Secondary | ICD-10-CM | POA: Diagnosis not present

## 2016-10-09 DIAGNOSIS — D696 Thrombocytopenia, unspecified: Secondary | ICD-10-CM | POA: Diagnosis present

## 2016-10-09 DIAGNOSIS — R0603 Acute respiratory distress: Secondary | ICD-10-CM

## 2016-10-09 DIAGNOSIS — I480 Paroxysmal atrial fibrillation: Secondary | ICD-10-CM | POA: Diagnosis present

## 2016-10-09 DIAGNOSIS — R0600 Dyspnea, unspecified: Secondary | ICD-10-CM | POA: Diagnosis present

## 2016-10-09 DIAGNOSIS — E1122 Type 2 diabetes mellitus with diabetic chronic kidney disease: Secondary | ICD-10-CM | POA: Diagnosis present

## 2016-10-09 DIAGNOSIS — E785 Hyperlipidemia, unspecified: Secondary | ICD-10-CM | POA: Diagnosis present

## 2016-10-09 DIAGNOSIS — E872 Acidosis: Secondary | ICD-10-CM | POA: Diagnosis not present

## 2016-10-09 DIAGNOSIS — N185 Chronic kidney disease, stage 5: Secondary | ICD-10-CM | POA: Diagnosis not present

## 2016-10-09 DIAGNOSIS — I509 Heart failure, unspecified: Secondary | ICD-10-CM | POA: Diagnosis not present

## 2016-10-09 DIAGNOSIS — N184 Chronic kidney disease, stage 4 (severe): Secondary | ICD-10-CM | POA: Diagnosis not present

## 2016-10-09 DIAGNOSIS — R791 Abnormal coagulation profile: Secondary | ICD-10-CM | POA: Diagnosis not present

## 2016-10-09 DIAGNOSIS — K559 Vascular disorder of intestine, unspecified: Secondary | ICD-10-CM | POA: Diagnosis not present

## 2016-10-09 DIAGNOSIS — Z951 Presence of aortocoronary bypass graft: Secondary | ICD-10-CM

## 2016-10-09 DIAGNOSIS — I2584 Coronary atherosclerosis due to calcified coronary lesion: Secondary | ICD-10-CM | POA: Diagnosis present

## 2016-10-09 DIAGNOSIS — R918 Other nonspecific abnormal finding of lung field: Secondary | ICD-10-CM | POA: Diagnosis not present

## 2016-10-09 DIAGNOSIS — Z7901 Long term (current) use of anticoagulants: Secondary | ICD-10-CM

## 2016-10-09 DIAGNOSIS — E11649 Type 2 diabetes mellitus with hypoglycemia without coma: Secondary | ICD-10-CM | POA: Diagnosis not present

## 2016-10-09 DIAGNOSIS — I5032 Chronic diastolic (congestive) heart failure: Secondary | ICD-10-CM

## 2016-10-09 DIAGNOSIS — R140 Abdominal distension (gaseous): Secondary | ICD-10-CM | POA: Diagnosis not present

## 2016-10-09 DIAGNOSIS — E669 Obesity, unspecified: Secondary | ICD-10-CM | POA: Diagnosis present

## 2016-10-09 DIAGNOSIS — M79631 Pain in right forearm: Secondary | ICD-10-CM | POA: Diagnosis not present

## 2016-10-09 DIAGNOSIS — A419 Sepsis, unspecified organism: Secondary | ICD-10-CM | POA: Diagnosis not present

## 2016-10-09 DIAGNOSIS — K9189 Other postprocedural complications and disorders of digestive system: Secondary | ICD-10-CM | POA: Diagnosis not present

## 2016-10-09 DIAGNOSIS — I25119 Atherosclerotic heart disease of native coronary artery with unspecified angina pectoris: Secondary | ICD-10-CM | POA: Diagnosis present

## 2016-10-09 DIAGNOSIS — E1140 Type 2 diabetes mellitus with diabetic neuropathy, unspecified: Secondary | ICD-10-CM | POA: Diagnosis present

## 2016-10-09 DIAGNOSIS — Z96651 Presence of right artificial knee joint: Secondary | ICD-10-CM | POA: Diagnosis present

## 2016-10-09 DIAGNOSIS — K5900 Constipation, unspecified: Secondary | ICD-10-CM | POA: Diagnosis present

## 2016-10-09 DIAGNOSIS — N186 End stage renal disease: Secondary | ICD-10-CM | POA: Diagnosis not present

## 2016-10-09 DIAGNOSIS — R079 Chest pain, unspecified: Secondary | ICD-10-CM | POA: Diagnosis not present

## 2016-10-09 DIAGNOSIS — J9811 Atelectasis: Secondary | ICD-10-CM

## 2016-10-09 DIAGNOSIS — I517 Cardiomegaly: Secondary | ICD-10-CM | POA: Diagnosis not present

## 2016-10-09 DIAGNOSIS — J811 Chronic pulmonary edema: Secondary | ICD-10-CM | POA: Diagnosis not present

## 2016-10-09 DIAGNOSIS — I953 Hypotension of hemodialysis: Secondary | ICD-10-CM | POA: Diagnosis not present

## 2016-10-09 DIAGNOSIS — N400 Enlarged prostate without lower urinary tract symptoms: Secondary | ICD-10-CM | POA: Diagnosis present

## 2016-10-09 DIAGNOSIS — Z794 Long term (current) use of insulin: Secondary | ICD-10-CM

## 2016-10-09 DIAGNOSIS — N200 Calculus of kidney: Secondary | ICD-10-CM | POA: Diagnosis not present

## 2016-10-09 DIAGNOSIS — J80 Acute respiratory distress syndrome: Secondary | ICD-10-CM | POA: Diagnosis not present

## 2016-10-09 DIAGNOSIS — I482 Chronic atrial fibrillation: Secondary | ICD-10-CM | POA: Diagnosis present

## 2016-10-09 DIAGNOSIS — G4700 Insomnia, unspecified: Secondary | ICD-10-CM | POA: Diagnosis not present

## 2016-10-09 DIAGNOSIS — Z01810 Encounter for preprocedural cardiovascular examination: Secondary | ICD-10-CM | POA: Diagnosis not present

## 2016-10-09 DIAGNOSIS — Z8679 Personal history of other diseases of the circulatory system: Secondary | ICD-10-CM

## 2016-10-09 DIAGNOSIS — M7989 Other specified soft tissue disorders: Secondary | ICD-10-CM | POA: Diagnosis not present

## 2016-10-09 DIAGNOSIS — I110 Hypertensive heart disease with heart failure: Secondary | ICD-10-CM | POA: Diagnosis not present

## 2016-10-09 DIAGNOSIS — I5020 Unspecified systolic (congestive) heart failure: Secondary | ICD-10-CM | POA: Diagnosis not present

## 2016-10-09 DIAGNOSIS — Y95 Nosocomial condition: Secondary | ICD-10-CM | POA: Diagnosis not present

## 2016-10-09 DIAGNOSIS — D62 Acute posthemorrhagic anemia: Secondary | ICD-10-CM | POA: Diagnosis not present

## 2016-10-09 DIAGNOSIS — E1142 Type 2 diabetes mellitus with diabetic polyneuropathy: Secondary | ICD-10-CM | POA: Diagnosis not present

## 2016-10-09 DIAGNOSIS — J4489 Other specified chronic obstructive pulmonary disease: Secondary | ICD-10-CM | POA: Diagnosis present

## 2016-10-09 DIAGNOSIS — Z6835 Body mass index (BMI) 35.0-35.9, adult: Secondary | ICD-10-CM

## 2016-10-09 DIAGNOSIS — I447 Left bundle-branch block, unspecified: Secondary | ICD-10-CM | POA: Diagnosis present

## 2016-10-09 DIAGNOSIS — D684 Acquired coagulation factor deficiency: Secondary | ICD-10-CM | POA: Diagnosis not present

## 2016-10-09 DIAGNOSIS — I132 Hypertensive heart and chronic kidney disease with heart failure and with stage 5 chronic kidney disease, or end stage renal disease: Secondary | ICD-10-CM | POA: Diagnosis present

## 2016-10-09 DIAGNOSIS — M19021 Primary osteoarthritis, right elbow: Secondary | ICD-10-CM | POA: Diagnosis not present

## 2016-10-09 DIAGNOSIS — J9383 Other pneumothorax: Secondary | ICD-10-CM | POA: Diagnosis not present

## 2016-10-09 DIAGNOSIS — D72829 Elevated white blood cell count, unspecified: Secondary | ICD-10-CM | POA: Diagnosis not present

## 2016-10-09 DIAGNOSIS — N183 Chronic kidney disease, stage 3 (moderate): Secondary | ICD-10-CM | POA: Diagnosis not present

## 2016-10-09 DIAGNOSIS — R0602 Shortness of breath: Secondary | ICD-10-CM | POA: Diagnosis not present

## 2016-10-09 DIAGNOSIS — I25810 Atherosclerosis of coronary artery bypass graft(s) without angina pectoris: Secondary | ICD-10-CM | POA: Diagnosis not present

## 2016-10-09 DIAGNOSIS — Z9289 Personal history of other medical treatment: Secondary | ICD-10-CM

## 2016-10-09 DIAGNOSIS — E039 Hypothyroidism, unspecified: Secondary | ICD-10-CM | POA: Diagnosis present

## 2016-10-09 DIAGNOSIS — Z9689 Presence of other specified functional implants: Secondary | ICD-10-CM

## 2016-10-09 DIAGNOSIS — Z7951 Long term (current) use of inhaled steroids: Secondary | ICD-10-CM

## 2016-10-09 DIAGNOSIS — I5033 Acute on chronic diastolic (congestive) heart failure: Secondary | ICD-10-CM | POA: Diagnosis not present

## 2016-10-09 DIAGNOSIS — J449 Chronic obstructive pulmonary disease, unspecified: Secondary | ICD-10-CM

## 2016-10-09 DIAGNOSIS — Z79899 Other long term (current) drug therapy: Secondary | ICD-10-CM

## 2016-10-09 DIAGNOSIS — J939 Pneumothorax, unspecified: Secondary | ICD-10-CM | POA: Diagnosis not present

## 2016-10-09 DIAGNOSIS — D649 Anemia, unspecified: Secondary | ICD-10-CM | POA: Diagnosis present

## 2016-10-09 DIAGNOSIS — J9602 Acute respiratory failure with hypercapnia: Secondary | ICD-10-CM | POA: Diagnosis not present

## 2016-10-09 DIAGNOSIS — R062 Wheezing: Secondary | ICD-10-CM

## 2016-10-09 DIAGNOSIS — E8770 Fluid overload, unspecified: Secondary | ICD-10-CM | POA: Diagnosis not present

## 2016-10-09 DIAGNOSIS — Z4901 Encounter for fitting and adjustment of extracorporeal dialysis catheter: Secondary | ICD-10-CM | POA: Diagnosis not present

## 2016-10-09 DIAGNOSIS — I120 Hypertensive chronic kidney disease with stage 5 chronic kidney disease or end stage renal disease: Secondary | ICD-10-CM | POA: Diagnosis not present

## 2016-10-09 DIAGNOSIS — G253 Myoclonus: Secondary | ICD-10-CM | POA: Diagnosis not present

## 2016-10-09 DIAGNOSIS — Z4659 Encounter for fitting and adjustment of other gastrointestinal appliance and device: Secondary | ICD-10-CM

## 2016-10-09 DIAGNOSIS — Z978 Presence of other specified devices: Secondary | ICD-10-CM

## 2016-10-09 DIAGNOSIS — R6520 Severe sepsis without septic shock: Secondary | ICD-10-CM | POA: Diagnosis not present

## 2016-10-09 HISTORY — DX: Other specified postprocedural states: Z98.890

## 2016-10-09 HISTORY — DX: Personal history of other diseases of the circulatory system: Z86.79

## 2016-10-09 HISTORY — PX: CLIPPING OF ATRIAL APPENDAGE: SHX5773

## 2016-10-09 HISTORY — PX: CORONARY ARTERY BYPASS GRAFT: SHX141

## 2016-10-09 HISTORY — DX: Presence of aortocoronary bypass graft: Z95.1

## 2016-10-09 HISTORY — PX: MAZE: SHX5063

## 2016-10-09 HISTORY — PX: TEE WITHOUT CARDIOVERSION: SHX5443

## 2016-10-09 LAB — POCT I-STAT 3, ART BLOOD GAS (G3+)
ACID-BASE DEFICIT: 7 mmol/L — AB (ref 0.0–2.0)
ACID-BASE EXCESS: 7 mmol/L — AB (ref 0.0–2.0)
ACID-BASE EXCESS: 2 mmol/L (ref 0.0–2.0)
Acid-Base Excess: 4 mmol/L — ABNORMAL HIGH (ref 0.0–2.0)
BICARBONATE: 29.3 mmol/L — AB (ref 20.0–28.0)
BICARBONATE: 23.8 mmol/L (ref 20.0–28.0)
Bicarbonate: 30.2 mmol/L — ABNORMAL HIGH (ref 20.0–28.0)
Bicarbonate: 25.9 mmol/L (ref 20.0–28.0)
Bicarbonate: 25.5 mmol/L (ref 20.0–28.0)
O2 SAT: 100 %
O2 SAT: 100 %
O2 Saturation: 100 %
O2 Saturation: 99 %
O2 Saturation: 90 %
PCO2 ART: 48.3 mmHg — AB (ref 32.0–48.0)
PCO2 ART: 36.7 mmHg (ref 32.0–48.0)
PH ART: 7.39 (ref 7.350–7.450)
PO2 ART: 579 mmHg — AB (ref 83.0–108.0)
PO2 ART: 294 mmHg — AB (ref 83.0–108.0)
Patient temperature: 36.4
TCO2: 31 mmol/L (ref 22–32)
TCO2: 31 mmol/L (ref 22–32)
TCO2: 27 mmol/L (ref 22–32)
TCO2: 27 mmol/L (ref 22–32)
TCO2: 26 mmol/L (ref 22–32)
pCO2 arterial: 38.3 mmHg (ref 32.0–48.0)
pCO2 arterial: 45 mmHg (ref 32.0–48.0)
pCO2 arterial: 74.1 mmHg (ref 32.0–48.0)
pH, Arterial: 7.523 — ABNORMAL HIGH (ref 7.350–7.450)
pH, Arterial: 7.439 (ref 7.350–7.450)
pH, Arterial: 7.358 (ref 7.350–7.450)
pH, Arterial: 7.11 — CL (ref 7.350–7.450)
pO2, Arterial: 434 mmHg — ABNORMAL HIGH (ref 83.0–108.0)
pO2, Arterial: 163 mmHg — ABNORMAL HIGH (ref 83.0–108.0)
pO2, Arterial: 77 mmHg — ABNORMAL LOW (ref 83.0–108.0)

## 2016-10-09 LAB — CBC
HEMATOCRIT: 28.9 % — AB (ref 39.0–52.0)
HEMATOCRIT: 32.1 % — AB (ref 39.0–52.0)
HEMOGLOBIN: 10.5 g/dL — AB (ref 13.0–17.0)
Hemoglobin: 10 g/dL — ABNORMAL LOW (ref 13.0–17.0)
MCH: 30.6 pg (ref 26.0–34.0)
MCH: 28.9 pg (ref 26.0–34.0)
MCHC: 34.6 g/dL (ref 30.0–36.0)
MCHC: 32.7 g/dL (ref 30.0–36.0)
MCV: 88.4 fL (ref 78.0–100.0)
MCV: 88.4 fL (ref 78.0–100.0)
Platelets: 101 10*3/uL — ABNORMAL LOW (ref 150–400)
Platelets: 114 10*3/uL — ABNORMAL LOW (ref 150–400)
RBC: 3.27 MIL/uL — ABNORMAL LOW (ref 4.22–5.81)
RBC: 3.63 MIL/uL — ABNORMAL LOW (ref 4.22–5.81)
RDW: 14.8 % (ref 11.5–15.5)
RDW: 14.9 % (ref 11.5–15.5)
WBC: 18.2 10*3/uL — ABNORMAL HIGH (ref 4.0–10.5)
WBC: 18.4 10*3/uL — ABNORMAL HIGH (ref 4.0–10.5)

## 2016-10-09 LAB — POCT I-STAT, CHEM 8
BUN: 34 mg/dL — ABNORMAL HIGH (ref 6–20)
BUN: 26 mg/dL — AB (ref 6–20)
BUN: 33 mg/dL — AB (ref 6–20)
BUN: 30 mg/dL — AB (ref 6–20)
BUN: 36 mg/dL — AB (ref 6–20)
BUN: 29 mg/dL — AB (ref 6–20)
CALCIUM ION: 1.05 mmol/L — AB (ref 1.15–1.40)
CALCIUM ION: 0.97 mmol/L — AB (ref 1.15–1.40)
CALCIUM ION: 1.12 mmol/L — AB (ref 1.15–1.40)
CHLORIDE: 102 mmol/L (ref 101–111)
CHLORIDE: 101 mmol/L (ref 101–111)
CHLORIDE: 102 mmol/L (ref 101–111)
CREATININE: 2 mg/dL — AB (ref 0.61–1.24)
CREATININE: 2.3 mg/dL — AB (ref 0.61–1.24)
CREATININE: 2.1 mg/dL — AB (ref 0.61–1.24)
CREATININE: 2.2 mg/dL — AB (ref 0.61–1.24)
Calcium, Ion: 1.07 mmol/L — ABNORMAL LOW (ref 1.15–1.40)
Calcium, Ion: 0.83 mmol/L — CL (ref 1.15–1.40)
Calcium, Ion: 1.09 mmol/L — ABNORMAL LOW (ref 1.15–1.40)
Chloride: 103 mmol/L (ref 101–111)
Chloride: 103 mmol/L (ref 101–111)
Chloride: 106 mmol/L (ref 101–111)
Creatinine, Ser: 2.3 mg/dL — ABNORMAL HIGH (ref 0.61–1.24)
Creatinine, Ser: 2.4 mg/dL — ABNORMAL HIGH (ref 0.61–1.24)
GLUCOSE: 124 mg/dL — AB (ref 65–99)
GLUCOSE: 229 mg/dL — AB (ref 65–99)
Glucose, Bld: 143 mg/dL — ABNORMAL HIGH (ref 65–99)
Glucose, Bld: 121 mg/dL — ABNORMAL HIGH (ref 65–99)
Glucose, Bld: 89 mg/dL (ref 65–99)
Glucose, Bld: 156 mg/dL — ABNORMAL HIGH (ref 65–99)
HCT: 28 % — ABNORMAL LOW (ref 39.0–52.0)
HCT: 23 % — ABNORMAL LOW (ref 39.0–52.0)
HCT: 21 % — ABNORMAL LOW (ref 39.0–52.0)
HEMATOCRIT: 18 % — AB (ref 39.0–52.0)
HEMATOCRIT: 27 % — AB (ref 39.0–52.0)
HEMATOCRIT: 30 % — AB (ref 39.0–52.0)
HEMOGLOBIN: 7.8 g/dL — AB (ref 13.0–17.0)
HEMOGLOBIN: 9.2 g/dL — AB (ref 13.0–17.0)
Hemoglobin: 9.5 g/dL — ABNORMAL LOW (ref 13.0–17.0)
Hemoglobin: 6.1 g/dL — CL (ref 13.0–17.0)
Hemoglobin: 7.1 g/dL — ABNORMAL LOW (ref 13.0–17.0)
Hemoglobin: 10.2 g/dL — ABNORMAL LOW (ref 13.0–17.0)
POTASSIUM: 3 mmol/L — AB (ref 3.5–5.1)
Potassium: 3.1 mmol/L — ABNORMAL LOW (ref 3.5–5.1)
Potassium: 3.2 mmol/L — ABNORMAL LOW (ref 3.5–5.1)
Potassium: 3.3 mmol/L — ABNORMAL LOW (ref 3.5–5.1)
Potassium: 3.3 mmol/L — ABNORMAL LOW (ref 3.5–5.1)
Potassium: 4.1 mmol/L (ref 3.5–5.1)
SODIUM: 140 mmol/L (ref 135–145)
SODIUM: 139 mmol/L (ref 135–145)
Sodium: 140 mmol/L (ref 135–145)
Sodium: 142 mmol/L (ref 135–145)
Sodium: 140 mmol/L (ref 135–145)
Sodium: 140 mmol/L (ref 135–145)
TCO2: 30 mmol/L (ref 22–32)
TCO2: 28 mmol/L (ref 22–32)
TCO2: 29 mmol/L (ref 22–32)
TCO2: 29 mmol/L (ref 22–32)
TCO2: 28 mmol/L (ref 22–32)
TCO2: 23 mmol/L (ref 22–32)

## 2016-10-09 LAB — GLUCOSE, CAPILLARY
GLUCOSE-CAPILLARY: 162 mg/dL — AB (ref 65–99)
GLUCOSE-CAPILLARY: 164 mg/dL — AB (ref 65–99)
GLUCOSE-CAPILLARY: 136 mg/dL — AB (ref 65–99)
Glucose-Capillary: 143 mg/dL — ABNORMAL HIGH (ref 65–99)
Glucose-Capillary: 140 mg/dL — ABNORMAL HIGH (ref 65–99)
Glucose-Capillary: 160 mg/dL — ABNORMAL HIGH (ref 65–99)
Glucose-Capillary: 148 mg/dL — ABNORMAL HIGH (ref 65–99)
Glucose-Capillary: 153 mg/dL — ABNORMAL HIGH (ref 65–99)

## 2016-10-09 LAB — PROTIME-INR
INR: 1.38
Prothrombin Time: 16.9 seconds — ABNORMAL HIGH (ref 11.4–15.2)

## 2016-10-09 LAB — PREPARE RBC (CROSSMATCH)

## 2016-10-09 LAB — PLATELET COUNT: PLATELETS: 113 10*3/uL — AB (ref 150–400)

## 2016-10-09 LAB — CREATININE, SERUM
Creatinine, Ser: 2.33 mg/dL — ABNORMAL HIGH (ref 0.61–1.24)
GFR calc Af Amer: 28 mL/min — ABNORMAL LOW (ref >60)
GFR calc non Af Amer: 24 mL/min — ABNORMAL LOW (ref >60)

## 2016-10-09 LAB — HEMOGLOBIN AND HEMATOCRIT, BLOOD
HCT: 24 % — ABNORMAL LOW (ref 39.0–52.0)
HEMOGLOBIN: 8 g/dL — AB (ref 13.0–17.0)

## 2016-10-09 LAB — POCT I-STAT 4, (NA,K, GLUC, HGB,HCT)
GLUCOSE: 163 mg/dL — AB (ref 65–99)
HEMATOCRIT: 28 % — AB (ref 39.0–52.0)
HEMOGLOBIN: 9.5 g/dL — AB (ref 13.0–17.0)
POTASSIUM: 3.3 mmol/L — AB (ref 3.5–5.1)
SODIUM: 142 mmol/L (ref 135–145)

## 2016-10-09 LAB — APTT: aPTT: 32 seconds (ref 24–36)

## 2016-10-09 LAB — MAGNESIUM: Magnesium: 3.1 mg/dL — ABNORMAL HIGH (ref 1.7–2.4)

## 2016-10-09 SURGERY — CORONARY ARTERY BYPASS GRAFTING (CABG)
Anesthesia: General | Site: Chest

## 2016-10-09 MED ORDER — PROTAMINE SULFATE 10 MG/ML IV SOLN
INTRAVENOUS | Status: DC | PRN
  Administered 2016-10-09: 350 mg via INTRAVENOUS

## 2016-10-09 MED ORDER — LEVOTHYROXINE SODIUM 25 MCG PO TABS
25.0000 ug | ORAL_TABLET | Freq: Every day | ORAL | Status: DC
  Administered 2016-10-10 – 2016-10-14 (×5): 25 ug via ORAL
  Filled 2016-10-09 (×5): qty 1

## 2016-10-09 MED ORDER — CALCIUM CHLORIDE 10 % IV SOLN
INTRAVENOUS | Status: DC | PRN
  Administered 2016-10-09: 100 mg via INTRAVENOUS
  Administered 2016-10-09: 200 mg via INTRAVENOUS

## 2016-10-09 MED ORDER — MORPHINE SULFATE (PF) 4 MG/ML IV SOLN: 1.0000 mg | INTRAVENOUS | Status: DC | PRN

## 2016-10-09 MED ORDER — MORPHINE SULFATE (PF) 4 MG/ML IV SOLN
1.0000 mg | INTRAVENOUS | Status: DC | PRN
  Administered 2016-10-12 – 2016-10-16 (×2): 2 mg via INTRAVENOUS
  Filled 2016-10-09 (×2): qty 1

## 2016-10-09 MED ORDER — TRAMADOL HCL 50 MG PO TABS
50.0000 mg | ORAL_TABLET | ORAL | Status: DC | PRN
  Administered 2016-10-13: 50 mg via ORAL
  Filled 2016-10-09: qty 1

## 2016-10-09 MED ORDER — NITROGLYCERIN IN D5W 200-5 MCG/ML-% IV SOLN: 0.0000 ug/min | INTRAVENOUS | Status: DC

## 2016-10-09 MED ORDER — HEPARIN SODIUM (PORCINE) 1000 UNIT/ML IJ SOLN
INTRAMUSCULAR | Status: AC
  Filled 2016-10-09: qty 1

## 2016-10-09 MED ORDER — MUPIROCIN 2 % EX OINT
1.0000 "application " | TOPICAL_OINTMENT | Freq: Two times a day (BID) | CUTANEOUS | Status: AC
  Administered 2016-10-09 – 2016-10-13 (×10): 1 via NASAL
  Filled 2016-10-09 (×2): qty 22

## 2016-10-09 MED ORDER — SODIUM CHLORIDE 0.9 % IV SOLN: INTRAVENOUS | Status: DC

## 2016-10-09 MED ORDER — PROTAMINE SULFATE 10 MG/ML IV SOLN
INTRAVENOUS | Status: AC
  Filled 2016-10-09: qty 25

## 2016-10-09 MED ORDER — LACTATED RINGERS IV SOLN
INTRAVENOUS | Status: DC | PRN
  Administered 2016-10-09: 07:00:00 via INTRAVENOUS

## 2016-10-09 MED ORDER — ROCURONIUM BROMIDE 10 MG/ML (PF) SYRINGE
PREFILLED_SYRINGE | INTRAVENOUS | Status: DC | PRN
  Administered 2016-10-09 (×4): 50 mg via INTRAVENOUS

## 2016-10-09 MED ORDER — SODIUM CHLORIDE 0.9 % IV SOLN
0.0000 ug/kg/h | INTRAVENOUS | Status: DC
  Administered 2016-10-09: 0.4 ug/kg/h via INTRAVENOUS
  Administered 2016-10-09: 0.7 ug/kg/h via INTRAVENOUS
  Filled 2016-10-09 (×2): qty 2

## 2016-10-09 MED ORDER — 0.9 % SODIUM CHLORIDE (POUR BTL) OPTIME
TOPICAL | Status: DC | PRN
  Administered 2016-10-09: 6000 mL

## 2016-10-09 MED ORDER — PROPOFOL 10 MG/ML IV BOLUS
INTRAVENOUS | Status: AC
  Filled 2016-10-09: qty 20

## 2016-10-09 MED ORDER — METOPROLOL TARTRATE 12.5 MG HALF TABLET
12.5000 mg | ORAL_TABLET | Freq: Two times a day (BID) | ORAL | Status: DC
  Administered 2016-10-10 – 2016-10-12 (×6): 12.5 mg via ORAL
  Filled 2016-10-09 (×6): qty 1

## 2016-10-09 MED ORDER — EPINEPHRINE PF 1 MG/10ML IJ SOSY
PREFILLED_SYRINGE | INTRAMUSCULAR | Status: AC
  Filled 2016-10-09: qty 10

## 2016-10-09 MED ORDER — PROTAMINE SULFATE 10 MG/ML IV SOLN
INTRAVENOUS | Status: AC
  Filled 2016-10-09: qty 15

## 2016-10-09 MED ORDER — MAGNESIUM SULFATE 4 GM/100ML IV SOLN
4.0000 g | Freq: Once | INTRAVENOUS | Status: AC
  Administered 2016-10-09: 4 g via INTRAVENOUS
  Filled 2016-10-09: qty 100

## 2016-10-09 MED ORDER — ONDANSETRON HCL 4 MG/2ML IJ SOLN
4.0000 mg | Freq: Four times a day (QID) | INTRAMUSCULAR | Status: DC | PRN
  Administered 2016-10-14 – 2016-10-31 (×11): 4 mg via INTRAVENOUS
  Filled 2016-10-09 (×9): qty 2

## 2016-10-09 MED ORDER — ORAL CARE MOUTH RINSE
15.0000 mL | Freq: Four times a day (QID) | OROMUCOSAL | Status: DC
  Administered 2016-10-10: 15 mL via OROMUCOSAL

## 2016-10-09 MED ORDER — DOCUSATE SODIUM 100 MG PO CAPS
200.0000 mg | ORAL_CAPSULE | Freq: Every day | ORAL | Status: DC
  Administered 2016-10-10 – 2016-10-14 (×5): 200 mg via ORAL
  Filled 2016-10-09 (×6): qty 2

## 2016-10-09 MED ORDER — ALBUMIN HUMAN 5 % IV SOLN
250.0000 mL | INTRAVENOUS | Status: DC | PRN
  Administered 2016-10-09 – 2016-10-10 (×3): 250 mL via INTRAVENOUS
  Filled 2016-10-09: qty 250

## 2016-10-09 MED ORDER — ASPIRIN 81 MG PO CHEW: 324.0000 mg | CHEWABLE_TABLET | Freq: Every day | ORAL | Status: DC

## 2016-10-09 MED ORDER — SODIUM CHLORIDE 0.9% FLUSH
10.0000 mL | INTRAVENOUS | Status: DC | PRN
  Administered 2016-10-22: 20 mL
  Filled 2016-10-09: qty 40

## 2016-10-09 MED ORDER — MIDAZOLAM HCL 2 MG/2ML IJ SOLN: 2.0000 mg | INTRAMUSCULAR | Status: DC | PRN

## 2016-10-09 MED ORDER — ALBUMIN HUMAN 5 % IV SOLN
INTRAVENOUS | Status: DC | PRN
  Administered 2016-10-09 (×2): via INTRAVENOUS

## 2016-10-09 MED ORDER — METOPROLOL TARTRATE 5 MG/5ML IV SOLN
2.5000 mg | INTRAVENOUS | Status: DC | PRN
  Administered 2016-10-13 (×2): 2.5 mg via INTRAVENOUS
  Filled 2016-10-09: qty 5

## 2016-10-09 MED ORDER — SODIUM CHLORIDE 0.9 % IJ SOLN
INTRAMUSCULAR | Status: DC | PRN
  Administered 2016-10-09 (×5): 4 mL via TOPICAL

## 2016-10-09 MED ORDER — SODIUM CHLORIDE 0.9 % IV SOLN
INTRAVENOUS | Status: DC | PRN
  Administered 2016-10-09: 14:00:00 via INTRAVENOUS

## 2016-10-09 MED ORDER — DEXTROSE 5 % IV SOLN
0.0000 ug/min | INTRAVENOUS | Status: DC
  Administered 2016-10-09: 10 ug/min via INTRAVENOUS
  Administered 2016-10-09: 20 ug/min via INTRAVENOUS
  Filled 2016-10-09 (×2): qty 4

## 2016-10-09 MED ORDER — PANTOPRAZOLE SODIUM 40 MG PO TBEC
40.0000 mg | DELAYED_RELEASE_TABLET | Freq: Every day | ORAL | Status: DC
  Administered 2016-10-11 – 2016-10-14 (×4): 40 mg via ORAL
  Filled 2016-10-09 (×4): qty 1

## 2016-10-09 MED ORDER — CHLORHEXIDINE GLUCONATE CLOTH 2 % EX PADS
6.0000 | MEDICATED_PAD | Freq: Every day | CUTANEOUS | Status: AC
  Administered 2016-10-09 – 2016-10-13 (×5): 6 via TOPICAL

## 2016-10-09 MED ORDER — SODIUM CHLORIDE 0.9 % IV SOLN: Freq: Once | INTRAVENOUS | Status: AC

## 2016-10-09 MED ORDER — LACTATED RINGERS IV SOLN: INTRAVENOUS | Status: DC

## 2016-10-09 MED ORDER — ACETAMINOPHEN 500 MG PO TABS
1000.0000 mg | ORAL_TABLET | Freq: Four times a day (QID) | ORAL | Status: AC
  Administered 2016-10-10 – 2016-10-14 (×17): 1000 mg via ORAL
  Filled 2016-10-09 (×17): qty 2

## 2016-10-09 MED ORDER — DOPAMINE-DEXTROSE 3.2-5 MG/ML-% IV SOLN: 3.0000 ug/kg/min | INTRAVENOUS | Status: DC

## 2016-10-09 MED ORDER — DEXTROSE 5 % IV SOLN
1.5000 g | Freq: Two times a day (BID) | INTRAVENOUS | Status: AC
  Administered 2016-10-09 – 2016-10-11 (×4): 1.5 g via INTRAVENOUS
  Filled 2016-10-09 (×4): qty 1.5

## 2016-10-09 MED ORDER — CHLORHEXIDINE GLUCONATE 0.12% ORAL RINSE (MEDLINE KIT)
15.0000 mL | Freq: Two times a day (BID) | OROMUCOSAL | Status: DC
  Administered 2016-10-09: 15 mL via OROMUCOSAL

## 2016-10-09 MED ORDER — POTASSIUM CHLORIDE 10 MEQ/50ML IV SOLN
10.0000 meq | INTRAVENOUS | Status: AC
  Administered 2016-10-09 (×3): 10 meq via INTRAVENOUS

## 2016-10-09 MED ORDER — HEPARIN SODIUM (PORCINE) 1000 UNIT/ML IJ SOLN
INTRAMUSCULAR | Status: DC | PRN
  Administered 2016-10-09: 34000 [IU] via INTRAVENOUS

## 2016-10-09 MED ORDER — PHENYLEPHRINE HCL 10 MG/ML IJ SOLN
INTRAMUSCULAR | Status: DC | PRN
  Administered 2016-10-09: 40 ug via INTRAVENOUS
  Administered 2016-10-09: 80 ug via INTRAVENOUS
  Administered 2016-10-09: 40 ug via INTRAVENOUS
  Administered 2016-10-09: 80 ug via INTRAVENOUS

## 2016-10-09 MED ORDER — METOPROLOL TARTRATE 12.5 MG HALF TABLET: 12.5000 mg | ORAL_TABLET | Freq: Once | ORAL | Status: DC

## 2016-10-09 MED ORDER — SODIUM CHLORIDE 0.45 % IV SOLN
INTRAVENOUS | Status: DC | PRN
  Administered 2016-10-09: 15:00:00 via INTRAVENOUS

## 2016-10-09 MED ORDER — SODIUM CHLORIDE 0.9 % IV SOLN: 250.0000 mL | INTRAVENOUS | Status: DC

## 2016-10-09 MED ORDER — METOPROLOL TARTRATE 25 MG/10 ML ORAL SUSPENSION
12.5000 mg | Freq: Two times a day (BID) | ORAL | Status: DC
  Administered 2016-10-09: 12.5 mg
  Filled 2016-10-09: qty 5

## 2016-10-09 MED ORDER — ACETAMINOPHEN 650 MG RE SUPP
650.0000 mg | Freq: Once | RECTAL | Status: AC
  Administered 2016-10-09: 650 mg via RECTAL

## 2016-10-09 MED ORDER — PROPOFOL 10 MG/ML IV BOLUS
INTRAVENOUS | Status: DC | PRN
Start: 2016-10-09 — End: 2016-10-09
  Administered 2016-10-09: 30 mg via INTRAVENOUS

## 2016-10-09 MED ORDER — SODIUM CHLORIDE 0.9% FLUSH
10.0000 mL | Freq: Two times a day (BID) | INTRAVENOUS | Status: DC
  Administered 2016-10-10 – 2016-10-23 (×16): 10 mL
  Administered 2016-10-23: 30 mL
  Administered 2016-10-24: 10 mL
  Administered 2016-10-24: 30 mL
  Administered 2016-10-25 – 2016-10-26 (×4): 10 mL
  Administered 2016-10-27: 30 mL
  Administered 2016-10-27 – 2016-10-28 (×3): 10 mL

## 2016-10-09 MED ORDER — SODIUM CHLORIDE 0.9 % IV SOLN
INTRAVENOUS | Status: DC
  Administered 2016-10-09: 16:00:00 via INTRAVENOUS

## 2016-10-09 MED ORDER — SODIUM CHLORIDE 0.9% FLUSH
3.0000 mL | INTRAVENOUS | Status: DC | PRN
  Administered 2016-10-13: 3 mL via INTRAVENOUS
  Filled 2016-10-09: qty 3

## 2016-10-09 MED ORDER — CALCIUM CHLORIDE 10 % IV SOLN
INTRAVENOUS | Status: AC
  Filled 2016-10-09: qty 10

## 2016-10-09 MED ORDER — FENTANYL CITRATE (PF) 250 MCG/5ML IJ SOLN
INTRAMUSCULAR | Status: AC
  Filled 2016-10-09: qty 25

## 2016-10-09 MED ORDER — ACETAMINOPHEN 160 MG/5ML PO SOLN
1000.0000 mg | Freq: Four times a day (QID) | ORAL | Status: DC
  Administered 2016-10-09: 1000 mg
  Filled 2016-10-09: qty 40.6

## 2016-10-09 MED ORDER — CHLORHEXIDINE GLUCONATE 0.12 % MT SOLN
15.0000 mL | Freq: Once | OROMUCOSAL | Status: AC
  Administered 2016-10-09: 15 mL via OROMUCOSAL

## 2016-10-09 MED ORDER — CHLORHEXIDINE GLUCONATE 0.12 % MT SOLN
15.0000 mL | OROMUCOSAL | Status: AC
  Administered 2016-10-09: 15 mL via OROMUCOSAL

## 2016-10-09 MED ORDER — MILRINONE LACTATE IN DEXTROSE 20-5 MG/100ML-% IV SOLN
0.2500 ug/kg/min | INTRAVENOUS | Status: DC
Start: 2016-10-09 — End: 2016-10-09
  Administered 2016-10-09: .3 ug/kg/min via INTRAVENOUS
  Filled 2016-10-09 (×2): qty 100

## 2016-10-09 MED ORDER — PHENYLEPHRINE HCL 10 MG/ML IJ SOLN
0.0000 ug/min | INTRAMUSCULAR | Status: DC
  Filled 2016-10-09: qty 2

## 2016-10-09 MED ORDER — ASPIRIN EC 325 MG PO TBEC
325.0000 mg | DELAYED_RELEASE_TABLET | Freq: Every day | ORAL | Status: DC
  Administered 2016-10-10 – 2016-10-14 (×5): 325 mg via ORAL
  Filled 2016-10-09 (×5): qty 1

## 2016-10-09 MED ORDER — ROCURONIUM BROMIDE 10 MG/ML (PF) SYRINGE
PREFILLED_SYRINGE | INTRAVENOUS | Status: AC
  Filled 2016-10-09: qty 10

## 2016-10-09 MED ORDER — VANCOMYCIN HCL IN DEXTROSE 1-5 GM/200ML-% IV SOLN
1000.0000 mg | Freq: Once | INTRAVENOUS | Status: AC
  Administered 2016-10-09: 1000 mg via INTRAVENOUS
  Filled 2016-10-09: qty 200

## 2016-10-09 MED ORDER — CHLORHEXIDINE GLUCONATE 4 % EX LIQD: 30.0000 mL | CUTANEOUS | Status: DC

## 2016-10-09 MED ORDER — INSULIN REGULAR HUMAN 100 UNIT/ML IJ SOLN
INTRAMUSCULAR | Status: DC
  Administered 2016-10-10: 2.6 [IU]/h via INTRAVENOUS
  Filled 2016-10-09: qty 1

## 2016-10-09 MED ORDER — ACETAMINOPHEN 160 MG/5ML PO SOLN
650.0000 mg | Freq: Once | ORAL | Status: AC
Start: 2016-10-09 — End: 2016-10-09

## 2016-10-09 MED ORDER — LACTATED RINGERS IV SOLN: 500.0000 mL | Freq: Once | INTRAVENOUS | Status: DC | PRN

## 2016-10-09 MED ORDER — SODIUM CHLORIDE 0.9% FLUSH
3.0000 mL | Freq: Two times a day (BID) | INTRAVENOUS | Status: DC
  Administered 2016-10-11 – 2016-10-28 (×12): 3 mL via INTRAVENOUS

## 2016-10-09 MED ORDER — AMIODARONE HCL IN DEXTROSE 360-4.14 MG/200ML-% IV SOLN
30.0000 mg/h | INTRAVENOUS | Status: DC
  Administered 2016-10-09 – 2016-10-11 (×5): 30 mg/h via INTRAVENOUS
  Filled 2016-10-09 (×5): qty 200

## 2016-10-09 MED ORDER — INSULIN REGULAR BOLUS VIA INFUSION
0.0000 [IU] | Freq: Three times a day (TID) | INTRAVENOUS | Status: DC
  Filled 2016-10-09: qty 10

## 2016-10-09 MED ORDER — FAMOTIDINE IN NACL 20-0.9 MG/50ML-% IV SOLN
20.0000 mg | Freq: Two times a day (BID) | INTRAVENOUS | Status: AC
  Administered 2016-10-09 – 2016-10-10 (×2): 20 mg via INTRAVENOUS
  Filled 2016-10-09: qty 50

## 2016-10-09 MED ORDER — MILRINONE LACTATE IN DEXTROSE 20-5 MG/100ML-% IV SOLN
0.0000 ug/kg/min | INTRAVENOUS | Status: DC
  Administered 2016-10-09: 0.3 ug/kg/min via INTRAVENOUS
  Administered 2016-10-10 – 2016-10-13 (×6): 0.2 ug/kg/min via INTRAVENOUS
  Filled 2016-10-09 (×7): qty 100

## 2016-10-09 MED ORDER — BISACODYL 10 MG RE SUPP
10.0000 mg | Freq: Every day | RECTAL | Status: DC
  Administered 2016-10-13 – 2016-10-20 (×5): 10 mg via RECTAL
  Filled 2016-10-09 (×6): qty 1

## 2016-10-09 MED ORDER — FENTANYL CITRATE (PF) 250 MCG/5ML IJ SOLN
INTRAMUSCULAR | Status: DC | PRN
  Administered 2016-10-09: 150 ug via INTRAVENOUS
  Administered 2016-10-09: 200 ug via INTRAVENOUS
  Administered 2016-10-09: 50 ug via INTRAVENOUS
  Administered 2016-10-09: 150 ug via INTRAVENOUS
  Administered 2016-10-09 (×2): 100 ug via INTRAVENOUS
  Administered 2016-10-09: 150 ug via INTRAVENOUS
  Administered 2016-10-09: 100 ug via INTRAVENOUS
  Administered 2016-10-09: 50 ug via INTRAVENOUS

## 2016-10-09 MED ORDER — OXYCODONE HCL 5 MG PO TABS
5.0000 mg | ORAL_TABLET | ORAL | Status: DC | PRN
  Administered 2016-10-10 – 2016-10-13 (×13): 5 mg via ORAL
  Filled 2016-10-09 (×13): qty 1

## 2016-10-09 MED ORDER — MIDAZOLAM HCL 5 MG/5ML IJ SOLN
INTRAMUSCULAR | Status: DC | PRN
  Administered 2016-10-09 (×2): 1 mg via INTRAVENOUS
  Administered 2016-10-09: 4 mg via INTRAVENOUS
  Administered 2016-10-09: 1 mg via INTRAVENOUS

## 2016-10-09 MED ORDER — BISACODYL 5 MG PO TBEC
10.0000 mg | DELAYED_RELEASE_TABLET | Freq: Every day | ORAL | Status: DC
  Administered 2016-10-10 – 2016-10-14 (×4): 10 mg via ORAL
  Filled 2016-10-09 (×5): qty 2

## 2016-10-09 MED ORDER — MIDAZOLAM HCL 10 MG/2ML IJ SOLN
INTRAMUSCULAR | Status: AC
  Filled 2016-10-09: qty 2

## 2016-10-09 MED ORDER — SODIUM BICARBONATE 8.4 % IV SOLN
50.0000 meq | Freq: Once | INTRAVENOUS | Status: AC
  Administered 2016-10-09: 50 meq via INTRAVENOUS

## 2016-10-09 SURGICAL SUPPLY — 154 items
ADAPTER CARDIO PERF ANTE/RETRO (ADAPTER) ×4 IMPLANT
ADH SKN CLS APL DERMABOND .7 (GAUZE/BANDAGES/DRESSINGS) ×3
ADPR PRFSN 84XANTGRD RTRGD (ADAPTER) ×3
ARTICLIP LAA PROCLIP II 40 (Clip) ×4 IMPLANT
ATTRACTOMAT 16X20 MAGNETIC DRP (DRAPES) ×3 IMPLANT
BAG DECANTER FOR FLEXI CONT (MISCELLANEOUS) ×12 IMPLANT
BANDAGE ACE 4X5 VEL STRL LF (GAUZE/BANDAGES/DRESSINGS) ×4 IMPLANT
BANDAGE ACE 6X5 VEL STRL LF (GAUZE/BANDAGES/DRESSINGS) ×4 IMPLANT
BASKET HEART (ORDER IN 25'S) (MISCELLANEOUS) ×1
BASKET HEART (ORDER IN 25S) (MISCELLANEOUS) ×3 IMPLANT
BLADE CLIPPER SURG (BLADE) IMPLANT
BLADE STERNUM SYSTEM 6 (BLADE) ×8 IMPLANT
BLADE SURG 11 STRL SS (BLADE) ×4 IMPLANT
BNDG GAUZE ELAST 4 BULKY (GAUZE/BANDAGES/DRESSINGS) ×4 IMPLANT
CANISTER SUCT 3000ML PPV (MISCELLANEOUS) ×7 IMPLANT
CANN PRFSN 3/8X14X24FR PCFC (MISCELLANEOUS)
CANN PRFSN 3/8XCNCT ST RT ANG (MISCELLANEOUS)
CANNULA EZ GLIDE AORTIC 21FR (CANNULA) ×8 IMPLANT
CANNULA FEM VENOUS REMOTE 22FR (CANNULA) ×1 IMPLANT
CANNULA GUNDRY RCSP 15FR (MISCELLANEOUS) ×4 IMPLANT
CANNULA PRFSN 3/8X14X24FR PCFC (MISCELLANEOUS) IMPLANT
CANNULA PRFSN 3/8XCNCT RT ANG (MISCELLANEOUS) IMPLANT
CANNULA SUMP PERICARDIAL (CANNULA) ×1 IMPLANT
CANNULA VEN MTL TIP RT (MISCELLANEOUS)
CANNULA VENNOUS METAL TIP 20FR (CANNULA) ×1 IMPLANT
CATH CPB KIT OWEN (MISCELLANEOUS) ×4 IMPLANT
CATH THORACIC 28FR RT ANG (CATHETERS) IMPLANT
CATH THORACIC 36FR (CATHETERS) ×4 IMPLANT
CLAMP ISOLATOR SYNERGY LG (MISCELLANEOUS) ×4 IMPLANT
CLAMP OLL ABLATION (MISCELLANEOUS) ×1 IMPLANT
CLIP FOGARTY SPRING 6M (CLIP) IMPLANT
CLIP RETRACTION 3.0MM CORONARY (MISCELLANEOUS) ×1 IMPLANT
CLIP VESOCCLUDE MED 24/CT (CLIP) IMPLANT
CLIP VESOCCLUDE SM WIDE 24/CT (CLIP) IMPLANT
CONN 1/2X1/2X1/2  BEN (MISCELLANEOUS) ×1
CONN 1/2X1/2X1/2 BEN (MISCELLANEOUS) ×3 IMPLANT
CONN 3/8X1/2 ST GISH (MISCELLANEOUS) ×8 IMPLANT
CONN ST 1/4X3/8  BEN (MISCELLANEOUS) ×1
CONN ST 1/4X3/8 BEN (MISCELLANEOUS) IMPLANT
CONNECTOR 1/2X3/8X1/2 3 WAY (MISCELLANEOUS) ×1
CONNECTOR 1/2X3/8X1/2 3WAY (MISCELLANEOUS) IMPLANT
COVER SURGICAL LIGHT HANDLE (MISCELLANEOUS) ×4 IMPLANT
CRADLE DONUT ADULT HEAD (MISCELLANEOUS) ×8 IMPLANT
DERMABOND ADVANCED (GAUZE/BANDAGES/DRESSINGS) ×1
DERMABOND ADVANCED .7 DNX12 (GAUZE/BANDAGES/DRESSINGS) IMPLANT
DEVICE ATRICLIP LAA PRCLPII 40 (Clip) IMPLANT
DRAIN CHANNEL 32F RND 10.7 FF (WOUND CARE) ×12 IMPLANT
DRAPE CARDIOVASCULAR INCISE (DRAPES) ×4
DRAPE INCISE IOBAN 66X45 STRL (DRAPES) ×4 IMPLANT
DRAPE SLUSH/WARMER DISC (DRAPES) ×8 IMPLANT
DRAPE SRG 135X102X78XABS (DRAPES) ×3 IMPLANT
DRAPE SURG 17X23 STRL (DRAPES) ×1 IMPLANT
DRSG COVADERM 4X14 (GAUZE/BANDAGES/DRESSINGS) ×7 IMPLANT
ELECT BLADE 4.0 EZ CLEAN MEGAD (MISCELLANEOUS) ×4
ELECT REM PT RETURN 9FT ADLT (ELECTROSURGICAL) ×8
ELECTRODE BLDE 4.0 EZ CLN MEGD (MISCELLANEOUS) ×3 IMPLANT
ELECTRODE REM PT RTRN 9FT ADLT (ELECTROSURGICAL) ×12 IMPLANT
FELT TEFLON 1X6 (MISCELLANEOUS) ×8 IMPLANT
GAUZE SPONGE 4X4 12PLY STRL (GAUZE/BANDAGES/DRESSINGS) ×14 IMPLANT
GLOVE BIO SURGEON STRL SZ 6 (GLOVE) IMPLANT
GLOVE BIO SURGEON STRL SZ 6.5 (GLOVE) ×3 IMPLANT
GLOVE BIO SURGEON STRL SZ7 (GLOVE) IMPLANT
GLOVE BIO SURGEON STRL SZ7.5 (GLOVE) ×5 IMPLANT
GLOVE BIOGEL PI IND STRL 6 (GLOVE) IMPLANT
GLOVE BIOGEL PI INDICATOR 6 (GLOVE) ×3
GLOVE ORTHO TXT STRL SZ7.5 (GLOVE) ×8 IMPLANT
GOWN STRL REUS W/ TWL LRG LVL3 (GOWN DISPOSABLE) ×24 IMPLANT
GOWN STRL REUS W/TWL LRG LVL3 (GOWN DISPOSABLE) ×56
HEMOSTAT POWDER SURGIFOAM 1G (HEMOSTASIS) ×24 IMPLANT
INSERT FOGARTY XLG (MISCELLANEOUS) ×8 IMPLANT
IV CATH 18G X1.75 CATHLON (IV SOLUTION) ×1 IMPLANT
KIT BASIN OR (CUSTOM PROCEDURE TRAY) ×8 IMPLANT
KIT DILATOR VASC 18G NDL (KITS) ×1 IMPLANT
KIT ROOM TURNOVER OR (KITS) ×8 IMPLANT
KIT SUCTION CATH 14FR (SUCTIONS) ×25 IMPLANT
KIT VASOVIEW HEMOPRO VH 3000 (KITS) ×4 IMPLANT
LEAD PACING MYOCARDI (MISCELLANEOUS) ×4 IMPLANT
LINE VENT (MISCELLANEOUS) ×1 IMPLANT
LOOP VESSEL SUPERMAXI WHITE (MISCELLANEOUS) ×5 IMPLANT
MARKER GRAFT CORONARY BYPASS (MISCELLANEOUS) ×10 IMPLANT
MASK FACE 3 LYR ANT FOG FR FLM (MASK) IMPLANT
MASK SURG FACE ANTI FOG ADLT (MASK) ×4
NS IRRIG 1000ML POUR BTL (IV SOLUTION) ×36 IMPLANT
PACK OPEN HEART (CUSTOM PROCEDURE TRAY) ×7 IMPLANT
PAD ARMBOARD 7.5X6 YLW CONV (MISCELLANEOUS) ×16 IMPLANT
PAD ELECT DEFIB RADIOL ZOLL (MISCELLANEOUS) ×4 IMPLANT
PENCIL BUTTON HOLSTER BLD 10FT (ELECTRODE) ×4 IMPLANT
PROBE CRYO2-ABLATION MALLABLE (MISCELLANEOUS) ×1 IMPLANT
PUNCH AORTIC ROTATE  4.5MM 8IN (MISCELLANEOUS) ×1 IMPLANT
PUNCH AORTIC ROTATE 4.0MM (MISCELLANEOUS) IMPLANT
PUNCH AORTIC ROTATE 4.5MM 8IN (MISCELLANEOUS) IMPLANT
PUNCH AORTIC ROTATE 5MM 8IN (MISCELLANEOUS) IMPLANT
SET CARDIOPLEGIA MPS 5001102 (MISCELLANEOUS) ×1 IMPLANT
SET IRRIG TUBING LAPAROSCOPIC (IRRIGATION / IRRIGATOR) ×4 IMPLANT
SOLUTION ANTI FOG 6CC (MISCELLANEOUS) IMPLANT
SPONGE LAP 18X18 X RAY DECT (DISPOSABLE) ×2 IMPLANT
SPONGE LAP 4X18 X RAY DECT (DISPOSABLE) ×1 IMPLANT
SUCKER INTRACARDIAC WEIGHTED (SUCKER) ×4 IMPLANT
SUT BONE WAX W31G (SUTURE) ×8 IMPLANT
SUT ETHIBOND 2 0 SH (SUTURE) ×24
SUT ETHIBOND 2 0 SH 36X2 (SUTURE) ×6 IMPLANT
SUT ETHIBOND X763 2 0 SH 1 (SUTURE) ×17 IMPLANT
SUT MNCRL AB 3-0 PS2 18 (SUTURE) ×16 IMPLANT
SUT MNCRL AB 4-0 PS2 18 (SUTURE) IMPLANT
SUT PDS AB 1 CTX 36 (SUTURE) ×16 IMPLANT
SUT PROLENE 2 0 SH DA (SUTURE) IMPLANT
SUT PROLENE 3 0 SH 1 (SUTURE) ×4 IMPLANT
SUT PROLENE 3 0 SH DA (SUTURE) ×6 IMPLANT
SUT PROLENE 3 0 SH1 36 (SUTURE) IMPLANT
SUT PROLENE 4 0 RB 1 (SUTURE) ×20
SUT PROLENE 4 0 SH DA (SUTURE) ×9 IMPLANT
SUT PROLENE 4-0 RB1 .5 CRCL 36 (SUTURE) ×6 IMPLANT
SUT PROLENE 5 0 C 1 36 (SUTURE) ×2 IMPLANT
SUT PROLENE 6 0 C 1 30 (SUTURE) ×4 IMPLANT
SUT PROLENE 7.0 RB 3 (SUTURE) ×14 IMPLANT
SUT PROLENE 8 0 BV175 6 (SUTURE) ×2 IMPLANT
SUT PROLENE BLUE 7 0 (SUTURE) ×4 IMPLANT
SUT PROLENE POLY MONO (SUTURE) ×3 IMPLANT
SUT SILK  1 MH (SUTURE) ×6
SUT SILK 1 MH (SUTURE) ×6 IMPLANT
SUT SILK 1 TIES 10X30 (SUTURE) ×1 IMPLANT
SUT SILK 2 0 SH CR/8 (SUTURE) ×2 IMPLANT
SUT SILK 2 0 TIES 10X30 (SUTURE) ×1 IMPLANT
SUT SILK 2 0 TIES 17X18 (SUTURE) ×4
SUT SILK 2-0 18XBRD TIE BLK (SUTURE) IMPLANT
SUT SILK 3 0 SH CR/8 (SUTURE) ×1 IMPLANT
SUT SILK 4 0 TIE 10X30 (SUTURE) ×2 IMPLANT
SUT STEEL 6MS V (SUTURE) ×1 IMPLANT
SUT STEEL STERNAL CCS#1 18IN (SUTURE) IMPLANT
SUT STEEL SZ 6 DBL 3X14 BALL (SUTURE) ×2 IMPLANT
SUT TEM PAC WIRE 2 0 SH (SUTURE) ×2 IMPLANT
SUT VIC AB 1 CTX 36 (SUTURE)
SUT VIC AB 1 CTX36XBRD ANBCTR (SUTURE) IMPLANT
SUT VIC AB 2-0 CT1 27 (SUTURE) ×4
SUT VIC AB 2-0 CT1 TAPERPNT 27 (SUTURE) IMPLANT
SUT VIC AB 2-0 CTX 27 (SUTURE) ×2 IMPLANT
SUT VIC AB 3-0 SH 27 (SUTURE)
SUT VIC AB 3-0 SH 27X BRD (SUTURE) IMPLANT
SUT VIC AB 3-0 X1 27 (SUTURE) ×2 IMPLANT
SUT VICRYL 4-0 PS2 18IN ABS (SUTURE) IMPLANT
SUTURE E-PAK OPEN HEART (SUTURE) ×3 IMPLANT
SYSTEM SAHARA CHEST DRAIN ATS (WOUND CARE) ×7 IMPLANT
TAPE CLOTH SURG 4X10 WHT LF (GAUZE/BANDAGES/DRESSINGS) ×1 IMPLANT
TAPE PAPER 2X10 WHT MICROPORE (GAUZE/BANDAGES/DRESSINGS) ×1 IMPLANT
TOWEL GREEN STERILE (TOWEL DISPOSABLE) ×13 IMPLANT
TOWEL GREEN STERILE FF (TOWEL DISPOSABLE) ×7 IMPLANT
TOWEL OR 17X24 6PK STRL BLUE (TOWEL DISPOSABLE) ×15 IMPLANT
TOWEL OR 17X26 10 PK STRL BLUE (TOWEL DISPOSABLE) ×15 IMPLANT
TRAY FOLEY SILVER 14FR TEMP (SET/KITS/TRAYS/PACK) ×3 IMPLANT
TRAY FOLEY SILVER 16FR TEMP (SET/KITS/TRAYS/PACK) ×4 IMPLANT
TUBING INSUFFLATION (TUBING) ×4 IMPLANT
UNDERPAD 30X30 (UNDERPADS AND DIAPERS) ×7 IMPLANT
WATER STERILE IRR 1000ML POUR (IV SOLUTION) ×14 IMPLANT
YANKAUER SUCT BULB TIP NO VENT (SUCTIONS) ×1 IMPLANT

## 2016-10-09 NOTE — Brief Op Note (Addendum)
10/09/2016  2:35 PM  PATIENT:  [name]  83 y.o. male  PRE-OPERATIVE DIAGNOSIS:  1. CAD 2. Atrial fibrillation  POST-OPERATIVE DIAGNOSIS:  1. CAD 2. Atrial fibrillation  PROCEDURE: TRANSESOPHAGEAL ECHOCARDIOGRAM (TEE), MEDIAN STERNOTOMY for CORONARY ARTERY BYPASS GRAFTING (CABG) x 2 (LIMA to LAD, SVG to OM), MAZE, CLIPPING OF ATRIAL APPENDAGE  SURGEON:  Surgeon(s) and Role:    Rexene Alberts, MD - Primary  PHYSICIAN ASSISTANT: Otho Ket PA-S and Lars Pinks PA-C  ANESTHESIA:   general  EBL:  Total I/O In: 4008 [I.V.:1800; Blood:400; IV Piggyback:250] Out: 1800 [Urine:1000; Blood:800]  DRAINS: Chest tubes placed in the mediastinal and pleural spaces   COUNTS CORRECT:  YES  DICTATION: .Dragon Dictation  PLAN OF CARE: Admit to inpatient   PATIENT DISPOSITION:  ICU - intubated and hemodynamically stable.   Delay start of Pharmacological VTE agent (>24hrs) due to surgical blood loss or risk of bleeding: yes  BASELINE WEIGHT: 98.9 kg  Rexene Alberts, MD 10/09/2016 2:35 PM

## 2016-10-09 NOTE — OR Nursing (Signed)
13:35 - 45 minute call to SICU

## 2016-10-09 NOTE — Transfer of Care (Signed)
Immediate Anesthesia Transfer of Care Note  Patient: [name]  Procedure(s) Performed: Procedure(s): CORONARY ARTERY BYPASS GRAFTING (CABG) x two, using left internal mammary artery and right leg greater saphenous vein harvested endoscopically (N/A) MAZE (N/A) TRANSESOPHAGEAL ECHOCARDIOGRAM (TEE) (N/A) CLIPPING OF ATRIAL APPENDAGE  Patient Location: ICU  Anesthesia Type:General  Level of Consciousness: sedated and Patient remains intubated per anesthesia plan  Airway & Oxygen Therapy: Patient remains intubated per anesthesia plan and Patient placed on Ventilator (see vital sign flow sheet for setting)  Post-op Assessment: Report given to RN and Post -op Vital signs reviewed and stable  Post vital signs: Reviewed and stable  Last Vitals:  Vitals:   10/09/16 0545 10/09/16 0630  BP: 136/76   Pulse: 68 64  Resp: 18 13  Temp: 36.6 C   SpO2: 100% 99%    Last Pain:  Vitals:   10/09/16 0545  TempSrc: Oral      Patients Stated Pain Goal: 3 (77/03/40 3524)  Complications: No apparent anesthesia complications

## 2016-10-09 NOTE — OR Nursing (Signed)
14:30 - 20 minute call to SICU 

## 2016-10-09 NOTE — Progress Notes (Signed)
While on CPAP/PS during RWP, pt became acutely diaphoretic, sats dropped from 96% to 90% on monitor. ABG drawn, showed respiratory acidosis, pH 7.11, pCO2 74.1, pO2 77, BE -7, bicarb 23.8, TCO2 26, sO2% 90%. Respiratory came, put pt back on full support (FiO2 50%/ 5 PEEP / rate 18).

## 2016-10-09 NOTE — Progress Notes (Signed)
Echocardiogram Echocardiogram Transesophageal has been performed.  [name] 10/09/2016, 8:47 AM

## 2016-10-09 NOTE — Progress Notes (Signed)
ABG drawn 1-hour after 1amp bicarb given. Results within parameters. Pt lethargic still, will reassess in 1 hour to see if ready to attempt wean again.

## 2016-10-09 NOTE — Anesthesia Procedure Notes (Addendum)
Procedure Name: Intubation Date/Time: 10/09/2016 8:12 AM Performed by: Candis Shine Pre-anesthesia Checklist: Patient identified, Emergency Drugs available, Suction available and Patient being monitored Patient Re-evaluated:Patient Re-evaluated prior to induction Oxygen Delivery Method: Circle System Utilized Preoxygenation: Pre-oxygenation with 100% oxygen Induction Type: IV induction Ventilation: Oral airway inserted - appropriate to patient size and Mask ventilation without difficulty Laryngoscope Size: Mac and 4 Grade View: Grade I Tube type: Oral Tube size: 7.5 mm Number of attempts: 1 Airway Equipment and Method: Stylet and Oral airway Placement Confirmation: ETT inserted through vocal cords under direct vision,  positive ETCO2 and breath sounds checked- equal and bilateral Secured at: 21 cm Tube secured with: Tape Dental Injury: Teeth and Oropharynx as per pre-operative assessment

## 2016-10-09 NOTE — Progress Notes (Signed)
Pink tape holding ETT noted to be peeling off pt's skin d/t sweat. Commercial tube holder applied. Respiratory in room, checked positioning. ETT at 21 at lip as it was prior to movement. Bilateral breath sounds auscultated. No acute changes to pt's respiratory status or sO2%. Will continue to monitor.

## 2016-10-09 NOTE — Progress Notes (Signed)
Patient ID: [name], male   DOB: [birth year], 83 y.o.   MRN: [id number]  SICU Evening Rounds:   Hemodynamically labile on milrinone 0.3 and levophed 18 mcg. Looks better after some volume and receiving a unit of PRBC's CI = 2.3 now Sinus with atrial pacing 80  Still on vent  Urine output good  CT output low  CBC    Component Value Date/Time   WBC 18.2 (H) 10/09/2016 1520   RBC 3.27 (L) 10/09/2016 1520   HGB 9.5 (L) 10/09/2016 1526   HCT 28.0 (L) 10/09/2016 1526   PLT 101 (L) 10/09/2016 1520   MCV 88.4 10/09/2016 1520   MCH 30.6 10/09/2016 1520   MCHC 34.6 10/09/2016 1520   RDW 14.8 10/09/2016 1520   LYMPHSABS 1.7 07/01/2016 1417   MONOABS 0.7 07/01/2016 1417   EOSABS 0.2 07/01/2016 1417   BASOSABS 0.1 07/01/2016 1417     BMET    Component Value Date/Time   NA 142 10/09/2016 1526   K 3.3 (L) 10/09/2016 1526   CL 103 10/09/2016 1339   CO2 22 10/06/2016 1352   GLUCOSE 163 (H) 10/09/2016 1526   BUN 36 (H) 10/09/2016 1339   CREATININE 2.10 (H) 10/09/2016 1339   CALCIUM 9.0 10/06/2016 1352   GFRNONAA 22 (L) 10/06/2016 1352   GFRAA 26 (L) 10/06/2016 1352     A/P:  Moderate to severe LVH with diastolic dysfunction but normal systolic function. Use volume as needed and wean levophed. Wean vent as tolerated.

## 2016-10-09 NOTE — Anesthesia Preprocedure Evaluation (Addendum)
Anesthesia Evaluation  Patient identified by MRN, date of birth, ID band Patient awake    Reviewed: Allergy & Precautions, NPO status , Patient's Chart, lab work & pertinent test results  Airway Mallampati: II  TM Distance: >3 FB Neck ROM: Full    Dental no notable dental hx.    Pulmonary asthma , COPD, former smoker,    breath sounds clear to auscultation       Cardiovascular hypertension, + angina with exertion + CAD and +CHF  + dysrhythmias  Rhythm:Regular Rate:Normal     Neuro/Psych    GI/Hepatic GERD  ,  Endo/Other  diabetesHypothyroidism   Renal/GU Renal InsufficiencyRenal disease     Musculoskeletal   Abdominal   Peds  Hematology   Anesthesia Other Findings   Reproductive/Obstetrics                            Anesthesia Physical Anesthesia Plan  ASA: III  Anesthesia Plan: General   Post-op Pain Management:    Induction: Intravenous  PONV Risk Score and Plan: 3 and Ondansetron, Dexamethasone, Midazolam, Propofol infusion and Treatment may vary due to age or medical condition  Airway Management Planned: Oral ETT  Additional Equipment: Arterial line, PA Cath and TEE  Intra-op Plan:   Post-operative Plan: Post-operative intubation/ventilation  Informed Consent: I have reviewed the patients History and Physical, chart, labs and discussed the procedure including the risks, benefits and alternatives for the proposed anesthesia with the patient or authorized representative who has indicated his/her understanding and acceptance.     Plan Discussed with: CRNA  Anesthesia Plan Comments:         Anesthesia Quick Evaluation

## 2016-10-09 NOTE — Op Note (Signed)
CARDIOTHORACIC SURGERY OPERATIVE NOTE  Date of Procedure: 10/09/2016  Preoperative Diagnosis:   Severe Left Main Coronary Artery Disease  Recurrent Paroxysmal Atrial Fibrillation  Postoperative Diagnosis:   Same  Procedure:    Coronary Artery Bypass Grafting x 2  Left Internal Mammary Artery to Distal Left Anterior Descending Coronary Artery Saphenous Vein Graft to Obtuse Marginal Branch of Left Circumflex Coronary Artery Endoscopic Vein Harvest from Right Thigh    Maze Procedure  Complete bilateral atrial lesion set using bipolar radiofrequency and cryothermy ablation Clipping of LA appendage (Atriclip, size 38mm)  Surgeon: Valentina Gu. Roxy Manns, MD  Assistant: Nani Skillern, PA-C and Otho Ket, PA-S  Anesthesia: Rica Koyanagi, MD  Operative Findings:  Normal left ventricular systolic function  Moderate-severe LV hypertrophy with diastolic dysfunction  Good quality left internal mammary artery conduit  Good quality saphenous vein conduit  Fair to good quality target vessels for grafting    BRIEF CLINICAL NOTE AND INDICATIONS FOR SURGERY  Patient is an 83 year old male with history of hypertension, chronic diastolic congestive heart failure, stage IV chronic kidney disease, hyperlipidemia, recurrent paroxysmal atrial fibrillation on long-term anticoagulation, bronchiectasis with chronic dyspnea on exertion, COPD with remote history of tobacco use, asthma, rheumatoid arthritis, type 2 diabetes mellitus, and anemia who has been referred for surgical consultation to discuss treatment options for management of left main coronary artery disease. The patient has no previous history of coronary artery disease. He has history of paroxysmal atrial fibrillation dating back several years for which she has been followed by Dr. Einar Gip. The patient states that he first began to experience significant exertional shortness of breath several yearsago. In 2015 an  echocardiogram was performed demonstrating severe diastolic dysfunction. Noninvasive stress testing revealed resting ejection fraction 44%. No signs of ischemia. Medical therapy was adjusted. In November 2017 he was hospitalized with acute exacerbation of symptoms of dyspnea on exertion, orthopnea, and lower extremity edema. Immediately prior to admission he had run out of his diuretic medications. Symptoms improved with diuresis. Over the past year the patient states that symptoms have progressed such that he now gets short of breath with very low level activity and occasionally at rest. He has episodes of orthopnea without PND. He has had chronic lower extremity edema. He was subsequently scheduled for elective diagnostic cardiac catheterization which was notable for 90% stenosis of the left main coronary artery. Cardiothoracic surgical consultation was requested.  The patient has been seen in consultation and counseled at length regarding the indications, risks and potential benefits of surgery.  All questions have been answered, and the patient provides full informed consent for the operation as described.     DETAILS OF THE OPERATIVE PROCEDURE  Preparation:  The patient is brought to the operating room on the above mentioned date and central monitoring was established by the anesthesia team including placement of Swan-Ganz catheter and radial arterial line. The patient is placed in the supine position on the operating table.  Intravenous antibiotics are administered. General endotracheal anesthesia is induced uneventfully. A Foley catheter is placed.  Baseline transesophageal echocardiogram was performed.  Findings were notable for normal LV systolic function with moderate-severe LV hypertrophy.  There was mild central mitral regurgitation.  The patient's chest, abdomen, both groins, and both lower extremities are prepared and draped in a sterile manner. A time out procedure is  performed.   Surgical Approach and Conduit Harvest:  A median sternotomy incision was performed and the left internal mammary artery is dissected from the chest wall  and prepared for bypass grafting. The left internal mammary artery is notably good quality conduit. Simultaneously, saphenous vein is obtained from the patient's right thigh using endoscopic vein harvest technique. The saphenous vein is notably good quality conduit. After removal of the saphenous vein, the small surgical incisions in the lower extremity are closed with absorbable suture. Following systemic heparinization, the left internal mammary artery was transected distally noted to have excellent flow.   Extracorporeal Cardiopulmonary Bypass and Myocardial Protection:  The pericardium is opened. The ascending aorta is normal in appearance. The right common femoral vein is cannulated using the Seldinger technique and a guidewire advanced into the right atrium using TEE guidance.  The patient is heparinized systemically and the femoral vein cannulated using a 22 Fr long femoral venous cannula.  The ascending aorta is cannulated for cardiopulmonary bypass.  Adequate heparinization is verified.   A retrograde cardioplegia cannula is placed through the right atrium into the coronary sinus.  The operative field is continuously flooded with carbon dioxide gas.   The entire pre-bypass portion of the operation was notable for stable hemodynamics.  Cardiopulmonary bypass was begun and the surface of the heart is inspected.  Distal target vessels are inspected for coronary artery bypass.  A second venous cannula is placed directly into the superior vena cava.   A cardioplegia cannula is placed in the ascending aorta.  A temperature probe was placed in the interventricular septum.  The patient is cooled to 32C systemic temperature.   The aortic cross clamp is applied and cardioplegia is delivered initially in an antegrade fashion through the  aortic root using modified [place] cold blood cardioplegia (Kennestone blood cardioplegia protocol).   The initial cardioplegic arrest is rapid with early diastolic arrest.  Myocardial protection was felt to be excellent.   Coronary Artery Bypass Grafting:   The obtuse marginal branch of the left circumflex coronary artery was grafted using a reversed saphenous vein graft in an end-to-side fashion.  At the site of distal anastomosis the target vessel was good fair to good quality and measured approximately 1.5 mm in diameter.  The distal left anterior coronary artery was grafted with the left internal mammary artery in an end-to-side fashion.  At the site of distal anastomosis the target vessel was fairly good quality and measured approximately 2.0 mm in diameter.   Maze Procedure:  The Atricure Synergy ablation system (bipolar radiofrequency ablation clamp) is used for all radiofrequency ablation lesions for the maze procedure.  The Atricure CryoICE nitrous oxide cryothermy ablation system is utilized for all cryothermy lesions.  The heart is retracted towards the surgeon's side and the left sided pulmonary veins exposed.  An elliptical ablation lesion is created around the base of the left sided pulmonary veins.  A similar elliptical lesion was created around the base of the left atrial appendage.  The left atrial appendage was obliterated by deploying a 40 mm Atricure left atrial appendage clip (Atriclip) across the appendage.  The heart was replaced into the pericardial sac.  A left atriotomy incision was performed through the interatrial groove and extended partially across the back wall of the left atrium after opening the oblique sinus inferiorly.  The floor of the left atrium and the mitral valve were exposed using a self-retaining retractor.   An ablation lesion was placed around the right sided pulmonary veins using the bipolar clamp with one limb of the clamp along the endocardial  surface and one along the epicardial surface posteriorly.  A  bipolar ablation lesion was placed across the dome of the left atrium from the cephalad apex of the atriotomy incision to reach the cephalad apex of the elliptical lesion around the left sided pulmonary veins.  A similar bipolar lesion was placed across the back wall of the left atrium from the caudad apex of the atriotomy incision to reach the caudad apex of the elliptical lesion around the left sided pulmonary veins, thereby completing a box.  Finally another bipolar lesion was placed across the back wall of the left atrium from the caudad apex of the atriotomy incision towards the posterior mitral valve annulus.  This lesion was completed along the endocardial surface onto the posterior mitral annulus with a 3 minute duration cryothermy lesion, followed by a second cryothermy lesion along the posterior epicardial surface of the left atrium to the coronary sinus.  This completes the entire left side lesion set of the Cox maze procedure.  The left atriotomy incision is closed using a 2 layer closure of running 3-0 Prolene suture after placing a sump drain across the mitral valve to serve as a left ventricular vent.  The single proximal vein graft anastomosis was placed directly to the ascending aorta prior to removal of the aortic cross clamp.  The septal myocardial temperature rose rapidly after reperfusion of the left internal mammary artery graft.  All air was evacuated through the aortic root.  The aortic cross clamp was removed after a total cross clamp time of 100 minutes.  The heart began to be spontaneously without any for cardioversion. The retrograde cardioplegia cannula is removed and the small hole in the right atrium is extended for several centimeters towards the lateral wall of the right atrium. The right side lesion set of the Cox maze procedure is performed using the bipolar radiofrequency ablation clamp to create longitudinal lesion  extending from the posterior apex of the right atriotomy incision along the lateral wall the right atrium to reach the lateral wall of the superior vena cava. A second lesion is placed in the opposite direction to reach the lateral wall of the inferior vena cava. A lesion is then placed extending away from the right atriotomy incision along the lateral wall of the right atrium to reach the apex of the right atrial appendage. One final lesion is performed using cryothermy along the endocardial surface of the right atrium extending from the anterior apex of the right atriotomy incision at the acute margin to reach the tricuspid annulus at the 2 o'clock position. The right atriotomy incision is closed using a 2 layer closure of running 4-0 Prolene suture.   Procedure Completion:  All proximal and distal coronary anastomoses were inspected for hemostasis and appropriate graft orientation. The vein graft to the obtuse marginal branch was notably slightly too short when the right heart was allowed to fill.  Subsequently a short interposition segment of greater saphenous vein is used to lengthen the vein graft.  Epicardial pacing wires are fixed to the right ventricular outflow tract and to the right atrial appendage. The patient is rewarmed to 37C temperature. The left ventricular vent is removed. The superior vena cava cannula was removed.  The patient is weaned and disconnected from cardiopulmonary bypass.  The patient's rhythm at separation from bypass was AV paced.    With initial attempt to wean from bypass the patient's inferior wall appeared akinetic and EKG revealed ST segment elevation in the inferior leads.  TEE revealed air within the left ventricle and ascending aorta.  Cardiopulmonary bypass was resumed and air evacuated through the aortic root and through an 18 gauge angiocath placed in the LV apex.  Ultimately the patient was weaned from cardioplegic bypass without difficulty on low dose dopamine.  Total cardiopulmonary bypass time for the operation was 185 minutes.  Followup transesophageal echocardiogram performed after separation from bypass revealed mild residual inferior wall hypokinesis and moderate mitral regurgitation.  There was no residual air.  The inferior hypokinesis and mitral regurgitation completely resolved prior to completion of the procedure.  The aortic cannula was removed uneventfully. Protamine was administered to reverse the anticoagulation. The femoral venous cannula is removed and manual pressure held on the groin for 30 minutes.  The mediastinum and pleural space were inspected for hemostasis and irrigated with saline solution. The mediastinum and left pleural space were drained using 3 chest tubes placed through separate stab incisions inferiorly.  The soft tissues anterior to the aorta were reapproximated loosely. The sternum is closed with double strength sternal wire. The soft tissues anterior to the sternum were closed in multiple layers and the skin is closed with a running subcuticular skin closure.  The post-bypass portion of the operation was notable for stable rhythm and hemodynamics.  The patient received 2 units packed red blood cells during the procedure due to anemia which was present preoperatively and exacerbated by acute blood loss and hemodilution during cardiopulmonary bypass.   Disposition:  The patient tolerated the procedure well and is transported to the surgical intensive care in stable condition. There are no intraoperative complications. All sponge instrument and needle counts are verified correct at completion of the operation.    Valentina Gu. Roxy Manns MD 10/09/2016 2:40 PM

## 2016-10-09 NOTE — Progress Notes (Signed)
Spoke with Dr. Cyndia Bent regarding ABG results. Gave orders for 1 amp bicarb now, check ABG in an hour. If base excess is <-3, give another amp of bicarb.  Will administer and continue to monitor.

## 2016-10-09 NOTE — Interval H&P Note (Signed)
History and Physical Interval Note:  10/09/2016 5:18 AM  [name]  has presented today for surgery, with the diagnosis of CAD  The various methods of treatment have been discussed with the patient and family. After consideration of risks, benefits and other options for treatment, the patient has consented to  Procedure(s): CORONARY ARTERY BYPASS GRAFTING (CABG) (N/A) MAZE (N/A) TRANSESOPHAGEAL ECHOCARDIOGRAM (TEE) (N/A) as a surgical intervention .  The patient's history has been reviewed, patient examined, no change in status, stable for surgery.  I have reviewed the patient's chart and labs.  Questions were answered to the patient's satisfaction.     Rexene Alberts

## 2016-10-09 NOTE — Progress Notes (Signed)
Respiratory notified readiness to wean.

## 2016-10-09 NOTE — Anesthesia Procedure Notes (Signed)
Arterial Line Insertion Start/End9/27/2018 7:15 AM, 10/09/2016 7:57 AM Performed by: Garrison Columbus T, CRNA  Patient location: Pre-op. Preanesthetic checklist: patient identified, IV checked, site marked, risks and benefits discussed, surgical consent, monitors and equipment checked, pre-op evaluation, timeout performed and anesthesia consent Lidocaine 1% used for infiltration Left, radial was placed Catheter size: 20 Fr Hand hygiene performed  and maximum sterile barriers used   Attempts: 3 Procedure performed without using ultrasound guided technique. Following insertion, dressing applied. Post procedure assessment: normal and unchanged  Patient tolerated the procedure well with no immediate complications.

## 2016-10-09 NOTE — Anesthesia Procedure Notes (Signed)
Central Venous Catheter Insertion Performed by: Roderic Palau, anesthesiologist Start/End9/27/2018 6:48 AM, 10/09/2016 6:58 AM Patient location: Pre-op. Preanesthetic checklist: patient identified, IV checked, site marked, risks and benefits discussed, surgical consent, monitors and equipment checked, pre-op evaluation, timeout performed and anesthesia consent Position: Trendelenburg Lidocaine 1% used for infiltration and patient sedated Hand hygiene performed , maximum sterile barriers used  and Seldinger technique used Catheter size: 8.5 Fr Total catheter length 10. Central line and PA cath was placed.Sheath introducer Swan type:thermodilution PA Cath depth:50 Procedure performed using ultrasound guided technique. Ultrasound Notes:anatomy identified, needle tip was noted to be adjacent to the nerve/plexus identified, no ultrasound evidence of intravascular and/or intraneural injection and image(s) printed for medical record Attempts: 1 Following insertion, line sutured and dressing applied. Post procedure assessment: blood return through all ports, free fluid flow and no air  Patient tolerated the procedure well with no immediate complications.

## 2016-10-10 ENCOUNTER — Inpatient Hospital Stay (HOSPITAL_COMMUNITY): Payer: Medicare Other

## 2016-10-10 ENCOUNTER — Encounter (HOSPITAL_COMMUNITY): Payer: Self-pay | Admitting: Thoracic Surgery (Cardiothoracic Vascular Surgery)

## 2016-10-10 LAB — GLUCOSE, CAPILLARY
GLUCOSE-CAPILLARY: 163 mg/dL — AB (ref 65–99)
GLUCOSE-CAPILLARY: 127 mg/dL — AB (ref 65–99)
GLUCOSE-CAPILLARY: 116 mg/dL — AB (ref 65–99)
GLUCOSE-CAPILLARY: 104 mg/dL — AB (ref 65–99)
GLUCOSE-CAPILLARY: 91 mg/dL (ref 65–99)
GLUCOSE-CAPILLARY: 146 mg/dL — AB (ref 65–99)
GLUCOSE-CAPILLARY: 137 mg/dL — AB (ref 65–99)
GLUCOSE-CAPILLARY: 155 mg/dL — AB (ref 65–99)
GLUCOSE-CAPILLARY: 197 mg/dL — AB (ref 65–99)
Glucose-Capillary: 109 mg/dL — ABNORMAL HIGH (ref 65–99)
Glucose-Capillary: 109 mg/dL — ABNORMAL HIGH (ref 65–99)
Glucose-Capillary: 129 mg/dL — ABNORMAL HIGH (ref 65–99)
Glucose-Capillary: 130 mg/dL — ABNORMAL HIGH (ref 65–99)
Glucose-Capillary: 155 mg/dL — ABNORMAL HIGH (ref 65–99)
Glucose-Capillary: 189 mg/dL — ABNORMAL HIGH (ref 65–99)
Glucose-Capillary: 88 mg/dL (ref 65–99)
Glucose-Capillary: 91 mg/dL (ref 65–99)
Glucose-Capillary: 96 mg/dL (ref 65–99)
Glucose-Capillary: 96 mg/dL (ref 65–99)

## 2016-10-10 LAB — CBC
HCT: 29.5 % — ABNORMAL LOW (ref 39.0–52.0)
HCT: 30.1 % — ABNORMAL LOW (ref 39.0–52.0)
Hemoglobin: 10.1 g/dL — ABNORMAL LOW (ref 13.0–17.0)
Hemoglobin: 9.9 g/dL — ABNORMAL LOW (ref 13.0–17.0)
MCH: 30.1 pg (ref 26.0–34.0)
MCH: 28.9 pg (ref 26.0–34.0)
MCHC: 34.2 g/dL (ref 30.0–36.0)
MCHC: 32.9 g/dL (ref 30.0–36.0)
MCV: 88.1 fL (ref 78.0–100.0)
MCV: 88 fL (ref 78.0–100.0)
PLATELETS: 106 10*3/uL — AB (ref 150–400)
PLATELETS: 105 10*3/uL — AB (ref 150–400)
RBC: 3.35 MIL/uL — AB (ref 4.22–5.81)
RBC: 3.42 MIL/uL — ABNORMAL LOW (ref 4.22–5.81)
RDW: 15.2 % (ref 11.5–15.5)
RDW: 15.5 % (ref 11.5–15.5)
WBC: 14.8 10*3/uL — AB (ref 4.0–10.5)
WBC: 17 10*3/uL — AB (ref 4.0–10.5)

## 2016-10-10 LAB — POCT I-STAT 3, ART BLOOD GAS (G3+)
ACID-BASE DEFICIT: 2 mmol/L (ref 0.0–2.0)
ACID-BASE DEFICIT: 3 mmol/L — AB (ref 0.0–2.0)
ACID-BASE DEFICIT: 4 mmol/L — AB (ref 0.0–2.0)
BICARBONATE: 23.8 mmol/L (ref 20.0–28.0)
BICARBONATE: 23.8 mmol/L (ref 20.0–28.0)
Bicarbonate: 23.3 mmol/L (ref 20.0–28.0)
O2 SAT: 98 %
O2 SAT: 97 %
O2 Saturation: 99 %
PCO2 ART: 42.9 mmHg (ref 32.0–48.0)
PH ART: 7.281 — AB (ref 7.350–7.450)
PH ART: 7.301 — AB (ref 7.350–7.450)
PO2 ART: 104 mmHg (ref 83.0–108.0)
Patient temperature: 36.5
Patient temperature: 36.5
TCO2: 25 mmol/L (ref 22–32)
TCO2: 25 mmol/L (ref 22–32)
TCO2: 25 mmol/L (ref 22–32)
pCO2 arterial: 49.2 mmHg — ABNORMAL HIGH (ref 32.0–48.0)
pCO2 arterial: 48.1 mmHg — ABNORMAL HIGH (ref 32.0–48.0)
pH, Arterial: 7.349 — ABNORMAL LOW (ref 7.350–7.450)
pO2, Arterial: 116 mmHg — ABNORMAL HIGH (ref 83.0–108.0)
pO2, Arterial: 147 mmHg — ABNORMAL HIGH (ref 83.0–108.0)

## 2016-10-10 LAB — BASIC METABOLIC PANEL
ANION GAP: 7 (ref 5–15)
BUN: 31 mg/dL — AB (ref 6–20)
CO2: 25 mmol/L (ref 22–32)
Calcium: 7.8 mg/dL — ABNORMAL LOW (ref 8.9–10.3)
Chloride: 108 mmol/L (ref 101–111)
Creatinine, Ser: 2.37 mg/dL — ABNORMAL HIGH (ref 0.61–1.24)
GFR calc Af Amer: 28 mL/min — ABNORMAL LOW (ref >60)
GFR, EST NON AFRICAN AMERICAN: 24 mL/min — AB (ref >60)
Glucose, Bld: 109 mg/dL — ABNORMAL HIGH (ref 65–99)
POTASSIUM: 3.4 mmol/L — AB (ref 3.5–5.1)
SODIUM: 140 mmol/L (ref 135–145)

## 2016-10-10 LAB — TYPE AND SCREEN
ABO/RH(D): A POS
Antibody Screen: NEGATIVE
UNIT DIVISION: 0
UNIT DIVISION: 0
Unit division: 0
Unit division: 0

## 2016-10-10 LAB — BPAM RBC
BLOOD PRODUCT EXPIRATION DATE: 201810112359
BLOOD PRODUCT EXPIRATION DATE: 201810162359
Blood Product Expiration Date: 201810112359
Blood Product Expiration Date: 201810162359
ISSUE DATE / TIME: 201809270759
ISSUE DATE / TIME: 201809271646
ISSUE DATE / TIME: 201809270759
UNIT TYPE AND RH: 6200
UNIT TYPE AND RH: 6200
UNIT TYPE AND RH: 6200
Unit Type and Rh: 6200

## 2016-10-10 LAB — CREATININE, SERUM
Creatinine, Ser: 2.49 mg/dL — ABNORMAL HIGH (ref 0.61–1.24)
GFR calc non Af Amer: 22 mL/min — ABNORMAL LOW (ref >60)
GFR, EST AFRICAN AMERICAN: 26 mL/min — AB (ref >60)

## 2016-10-10 LAB — POCT I-STAT, CHEM 8
BUN: 29 mg/dL — ABNORMAL HIGH (ref 6–20)
CHLORIDE: 103 mmol/L (ref 101–111)
Calcium, Ion: 1.03 mmol/L — ABNORMAL LOW (ref 1.15–1.40)
Creatinine, Ser: 2.4 mg/dL — ABNORMAL HIGH (ref 0.61–1.24)
GLUCOSE: 153 mg/dL — AB (ref 65–99)
HCT: 28 % — ABNORMAL LOW (ref 39.0–52.0)
Hemoglobin: 9.5 g/dL — ABNORMAL LOW (ref 13.0–17.0)
POTASSIUM: 3.9 mmol/L (ref 3.5–5.1)
Sodium: 137 mmol/L (ref 135–145)
TCO2: 23 mmol/L (ref 22–32)

## 2016-10-10 LAB — COOXEMETRY PANEL
CARBOXYHEMOGLOBIN: 1.4 % (ref 0.5–1.5)
METHEMOGLOBIN: 1.2 % (ref 0.0–1.5)
O2 SAT: 73.2 %
TOTAL HEMOGLOBIN: 10 g/dL — AB (ref 12.0–16.0)

## 2016-10-10 LAB — MAGNESIUM
MAGNESIUM: 2.9 mg/dL — AB (ref 1.7–2.4)
Magnesium: 2.6 mg/dL — ABNORMAL HIGH (ref 1.7–2.4)

## 2016-10-10 MED ORDER — WARFARIN SODIUM 2.5 MG PO TABS
2.5000 mg | ORAL_TABLET | Freq: Every day | ORAL | Status: DC
  Administered 2016-10-10 – 2016-10-12 (×3): 2.5 mg via ORAL
  Filled 2016-10-10 (×3): qty 1

## 2016-10-10 MED ORDER — FERROUS SULFATE 325 (65 FE) MG PO TABS
325.0000 mg | ORAL_TABLET | Freq: Every day | ORAL | Status: DC
  Administered 2016-10-11 – 2016-10-14 (×4): 325 mg via ORAL
  Filled 2016-10-10 (×5): qty 1

## 2016-10-10 MED ORDER — GABAPENTIN 300 MG PO CAPS
300.0000 mg | ORAL_CAPSULE | Freq: Two times a day (BID) | ORAL | Status: DC
  Administered 2016-10-10 – 2016-10-14 (×9): 300 mg via ORAL
  Filled 2016-10-10 (×10): qty 1

## 2016-10-10 MED ORDER — SODIUM BICARBONATE 8.4 % IV SOLN
50.0000 meq | Freq: Once | INTRAVENOUS | Status: AC
  Administered 2016-10-10: 50 meq via INTRAVENOUS

## 2016-10-10 MED ORDER — POTASSIUM CHLORIDE 10 MEQ/50ML IV SOLN
10.0000 meq | INTRAVENOUS | Status: AC
  Administered 2016-10-10 (×3): 10 meq via INTRAVENOUS
  Filled 2016-10-10 (×3): qty 50

## 2016-10-10 MED ORDER — TRAZODONE HCL 50 MG PO TABS
50.0000 mg | ORAL_TABLET | Freq: Every evening | ORAL | Status: DC | PRN
  Administered 2016-10-13 – 2016-10-14 (×2): 50 mg via ORAL
  Filled 2016-10-10 (×3): qty 1

## 2016-10-10 MED ORDER — INSULIN DETEMIR 100 UNIT/ML ~~LOC~~ SOLN
20.0000 [IU] | Freq: Every day | SUBCUTANEOUS | Status: DC
  Administered 2016-10-10 – 2016-10-14 (×5): 20 [IU] via SUBCUTANEOUS
  Filled 2016-10-10 (×7): qty 0.2

## 2016-10-10 MED ORDER — TAMSULOSIN HCL 0.4 MG PO CAPS
0.4000 mg | ORAL_CAPSULE | Freq: Every day | ORAL | Status: DC
  Administered 2016-10-10 – 2016-10-14 (×5): 0.4 mg via ORAL
  Filled 2016-10-10 (×6): qty 1

## 2016-10-10 MED ORDER — ATORVASTATIN CALCIUM 40 MG PO TABS
40.0000 mg | ORAL_TABLET | Freq: Every day | ORAL | Status: DC
  Administered 2016-10-11 – 2016-10-14 (×4): 40 mg via ORAL
  Filled 2016-10-10 (×4): qty 1

## 2016-10-10 MED ORDER — FINASTERIDE 5 MG PO TABS
5.0000 mg | ORAL_TABLET | Freq: Every day | ORAL | Status: DC
  Administered 2016-10-11 – 2016-10-14 (×4): 5 mg via ORAL
  Filled 2016-10-10 (×6): qty 1

## 2016-10-10 MED ORDER — INSULIN ASPART 100 UNIT/ML ~~LOC~~ SOLN
0.0000 [IU] | SUBCUTANEOUS | Status: DC
  Administered 2016-10-10: 2 [IU] via SUBCUTANEOUS
  Administered 2016-10-10 – 2016-10-11 (×3): 4 [IU] via SUBCUTANEOUS
  Administered 2016-10-11: 2 [IU] via SUBCUTANEOUS
  Administered 2016-10-11 (×2): 4 [IU] via SUBCUTANEOUS
  Administered 2016-10-11 – 2016-10-12 (×3): 2 [IU] via SUBCUTANEOUS
  Administered 2016-10-12: 8 [IU] via SUBCUTANEOUS
  Administered 2016-10-13: 2 [IU] via SUBCUTANEOUS
  Administered 2016-10-13 (×2): 8 [IU] via SUBCUTANEOUS
  Administered 2016-10-14: 2 [IU] via SUBCUTANEOUS
  Administered 2016-10-14 (×2): 4 [IU] via SUBCUTANEOUS
  Administered 2016-10-15 (×3): 2 [IU] via SUBCUTANEOUS
  Administered 2016-10-16: 8 [IU] via SUBCUTANEOUS
  Administered 2016-10-16: 2 [IU] via SUBCUTANEOUS
  Administered 2016-10-17: 4 [IU] via SUBCUTANEOUS
  Administered 2016-10-17: 2 [IU] via SUBCUTANEOUS
  Administered 2016-10-17 (×2): 4 [IU] via SUBCUTANEOUS
  Administered 2016-10-17 (×2): 2 [IU] via SUBCUTANEOUS
  Administered 2016-10-18: 4 [IU] via SUBCUTANEOUS
  Administered 2016-10-18 – 2016-10-19 (×3): 2 [IU] via SUBCUTANEOUS
  Administered 2016-10-19 (×2): 4 [IU] via SUBCUTANEOUS
  Administered 2016-10-19: 2 [IU] via SUBCUTANEOUS
  Administered 2016-10-20 (×4): 4 [IU] via SUBCUTANEOUS
  Administered 2016-10-20 – 2016-10-21 (×3): 2 [IU] via SUBCUTANEOUS
  Administered 2016-10-21: 4 [IU] via SUBCUTANEOUS
  Administered 2016-10-21: 8 [IU] via SUBCUTANEOUS
  Administered 2016-10-21 – 2016-10-22 (×2): 2 [IU] via SUBCUTANEOUS
  Administered 2016-10-22 (×3): 4 [IU] via SUBCUTANEOUS
  Administered 2016-10-22: 12 [IU] via SUBCUTANEOUS
  Administered 2016-10-22: 4 [IU] via SUBCUTANEOUS
  Administered 2016-10-23: 8 [IU] via SUBCUTANEOUS
  Administered 2016-10-23: 12 [IU] via SUBCUTANEOUS
  Administered 2016-10-23 (×2): 4 [IU] via SUBCUTANEOUS
  Administered 2016-10-23: 20 [IU] via SUBCUTANEOUS
  Administered 2016-10-24: 2 [IU] via SUBCUTANEOUS
  Administered 2016-10-24: 4 [IU] via SUBCUTANEOUS
  Administered 2016-10-24: 16 [IU] via SUBCUTANEOUS
  Administered 2016-10-24: 2 [IU] via SUBCUTANEOUS
  Administered 2016-10-24 – 2016-10-25 (×2): 12 [IU] via SUBCUTANEOUS
  Administered 2016-10-25 (×2): 2 [IU] via SUBCUTANEOUS
  Administered 2016-10-25 – 2016-10-26 (×4): 8 [IU] via SUBCUTANEOUS
  Administered 2016-10-26: 4 [IU] via SUBCUTANEOUS
  Administered 2016-10-26: 2 [IU] via SUBCUTANEOUS
  Administered 2016-10-27: 8 [IU] via SUBCUTANEOUS
  Administered 2016-10-27: 2 [IU] via SUBCUTANEOUS
  Administered 2016-10-27: 4 [IU] via SUBCUTANEOUS
  Administered 2016-10-27: 2 [IU] via SUBCUTANEOUS
  Administered 2016-10-28 – 2016-10-29 (×2): 4 [IU] via SUBCUTANEOUS
  Administered 2016-10-29: 20 [IU] via SUBCUTANEOUS
  Administered 2016-10-29: 12 [IU] via SUBCUTANEOUS
  Administered 2016-10-29: 8 [IU] via SUBCUTANEOUS
  Administered 2016-10-29: 4 [IU] via SUBCUTANEOUS
  Administered 2016-10-30: 12 [IU] via SUBCUTANEOUS
  Administered 2016-10-30: 8 [IU] via SUBCUTANEOUS
  Administered 2016-10-30: 2 [IU] via SUBCUTANEOUS
  Administered 2016-10-30: 4 [IU] via SUBCUTANEOUS
  Administered 2016-10-30: 2 [IU] via SUBCUTANEOUS
  Administered 2016-10-30: 8 [IU] via SUBCUTANEOUS
  Administered 2016-10-31: 4 [IU] via SUBCUTANEOUS
  Administered 2016-10-31: 2 [IU] via SUBCUTANEOUS

## 2016-10-10 MED ORDER — FUROSEMIDE 10 MG/ML IJ SOLN
15.0000 mg/h | INTRAVENOUS | Status: DC
  Administered 2016-10-10 – 2016-10-12 (×3): 10 mg/h via INTRAVENOUS
  Administered 2016-10-14 – 2016-10-15 (×2): 15 mg/h via INTRAVENOUS
  Filled 2016-10-10 (×2): qty 25
  Filled 2016-10-10: qty 21
  Filled 2016-10-10 (×12): qty 25

## 2016-10-10 MED ORDER — WARFARIN - PHYSICIAN DOSING INPATIENT
Freq: Every day | Status: DC
  Administered 2016-10-15: 18:00:00

## 2016-10-10 MED ORDER — ENOXAPARIN SODIUM 30 MG/0.3ML ~~LOC~~ SOLN
30.0000 mg | Freq: Every day | SUBCUTANEOUS | Status: DC
  Administered 2016-10-11 – 2016-10-14 (×4): 30 mg via SUBCUTANEOUS
  Filled 2016-10-10 (×4): qty 0.3

## 2016-10-10 MED ORDER — PATIENT'S GUIDE TO USING COUMADIN BOOK
Freq: Once | Status: DC
  Filled 2016-10-10 (×2): qty 1

## 2016-10-10 MED ORDER — LACTATED RINGERS IV SOLN: INTRAVENOUS | Status: DC

## 2016-10-10 MED ORDER — FOLIC ACID 1 MG PO TABS
1.0000 mg | ORAL_TABLET | Freq: Every day | ORAL | Status: DC
  Administered 2016-10-10 – 2016-10-14 (×5): 1 mg via ORAL
  Filled 2016-10-10 (×6): qty 1

## 2016-10-10 MED ORDER — ORAL CARE MOUTH RINSE: 15.0000 mL | Freq: Two times a day (BID) | OROMUCOSAL | Status: DC

## 2016-10-10 MED FILL — Magnesium Sulfate Inj 50%: INTRAMUSCULAR | Qty: 10 | Status: AC

## 2016-10-10 MED FILL — Potassium Chloride Inj 2 mEq/ML: INTRAVENOUS | Qty: 40 | Status: AC

## 2016-10-10 MED FILL — Heparin Sodium (Porcine) Inj 1000 Unit/ML: INTRAMUSCULAR | Qty: 30 | Status: AC

## 2016-10-10 NOTE — Progress Notes (Signed)
Respiratory notified readiness to wean.

## 2016-10-10 NOTE — Procedures (Signed)
Extubation Procedure Note  Patient Details:   Name: [name] DOB: 02-29-1930 MRN: [id number]   Airway Documentation:  Airway 7.5 mm (Active)  Secured at (cm) 21 cm 10/09/2016 11:44 PM  Measured From Lips 10/09/2016 11:44 PM  Secured Location Left 10/09/2016 11:44 PM  Secured By [employer] 10/09/2016 11:44 PM  Tube Holder Repositioned Yes 10/09/2016 11:44 PM  Cuff Pressure (cm H2O) 24 cm H2O 10/09/2016 11:44 PM  Site Condition Dry 10/09/2016 11:44 PM    Evaluation  O2 sats: stable throughout Complications: No apparent complications Patient did tolerate procedure well. Bilateral Breath Sounds: Clear   Yes   Pulmonary mechanics done prior to extubation. NIF-22, and VC 1.6. Extubated pt to 4L [place] per MD(Bartle), RN at bedside and Pt able to pull 500ML on IS with good pt Effort. Pt also able to vocalize after extubation. RT will cont to monitor.   Ikechukwu Cerny, Kerri Perches F 10/10/2016, 2:56 AM

## 2016-10-10 NOTE — Care Management Note (Signed)
Case Management Note  Patient Details  Name: [name] MRN: [id number] Date of Birth: 1930/09/27  Subjective/Objective:  From home with spouse , POD 1 CABG, weaning drips.                 Action/Plan: NCM will follow for dc needs.   Expected Discharge Date:   (TBD)               Expected Discharge Plan:  [place]  In-House Referral:     Discharge planning Services  CM Consult  Post Acute Care Choice:    Choice offered to:     DME Arranged:    DME Agency:     HH Arranged:    HH Agency:     Status of Service:  In process, will continue to follow  If discussed at Long Length of Stay Meetings, dates discussed:    Additional Comments:  Zenon Mayo, RN 10/10/2016, 10:56 AM

## 2016-10-10 NOTE — Progress Notes (Signed)
Patient ID: [name], male   DOB: [birth year], 83 y.o.   MRN: [id number] EVENING ROUNDS NOTE :     [address]       [place] [address]             [phone]                 1 Day Post-Op Procedure(s) (LRB): CORONARY ARTERY BYPASS GRAFTING (CABG) x two, using left internal mammary artery and right leg greater saphenous vein harvested endoscopically (N/A) MAZE (N/A) TRANSESOPHAGEAL ECHOCARDIOGRAM (TEE) (N/A) CLIPPING OF ATRIAL APPENDAGE  Total Length of Stay:  LOS: 1 day  BP 139/62 (BP Location: Right Arm)   Pulse 80   Temp 97.7 F (36.5 C) (Oral)   Resp 13   Ht 5\' 8"  (1.727 m)   Wt 235 lb 10.8 oz (106.9 kg)   SpO2 97%   BMI 35.83 kg/m   .Intake/Output      09/28 0701 - 09/29 0700   P.O. 1040   I.V. (mL/kg) 645.5 (6)   Total Intake(mL/kg) 1685.5 (15.8)   Urine (mL/kg/hr) 1005 (0.6)   Chest Tube 330   Total Output 1335   Net +350.5         . sodium chloride    . amiodarone 30 mg/hr (10/10/16 2119)  . cefUROXime (ZINACEF)  IV 1.5 g (10/10/16 2230)  . furosemide (LASIX) infusion 10 mg/hr (10/10/16 2000)  . lactated ringers    . lactated ringers Stopped (10/09/16 1708)  . lactated ringers 20 mL/hr at 10/10/16 2000  . milrinone 0.2 mcg/kg/min (10/10/16 2000)     Lab Results  Component Value Date   WBC 17.0 (H) 10/10/2016   HGB 9.9 (L) 10/10/2016   HCT 30.1 (L) 10/10/2016   PLT 105 (L) 10/10/2016   GLUCOSE 153 (H) 10/10/2016   CHOL 179 08/20/2014   TRIG 105 08/20/2014   HDL 30 (L) 08/20/2014   LDLCALC 128 (H) 08/20/2014   ALT 15 (L) 10/06/2016   AST 19 10/06/2016   NA 137 10/10/2016   K 3.9 10/10/2016   CL 103 10/10/2016   CREATININE 2.49 (H) 10/10/2016   BUN 29 (H) 10/10/2016   CO2 25 10/10/2016   TSH 6.623 (H) 10/01/2016   INR 1.38 10/09/2016   HGBA1C 7.2 (H) 10/06/2016   uop ok, cr at baseline   Grace Isaac MD  Beeper [phone] Office [phone] 10/10/2016 10:37 PM

## 2016-10-10 NOTE — Discharge Summary (Signed)
Physician Discharge Summary  Patient ID: JOFFRE LUCKS MRN: [id number] DOB/AGE: [birth year] 83 y.o.  Admit date: 10/09/2016 Discharge date: 11/03/2016  Admission Diagnoses:  Patient Active Problem List   Diagnosis Date Noted  . Coronary artery disease involving native coronary artery of native heart with angina pectoris ([place]) 09/30/2016  . Fusarium infection ([place]) 01/10/2016  . Dyspnea on exertion 09/12/2015  . Sepsis due to undetermined organism ([place])   . Chronic systolic CHF (congestive heart failure) ([place])   . Chronic kidney disease (CKD), stage IV (severe) ([place])   . Cellulitis of left lower extremity   . Pulmonary hypertension ([place])   . Paroxysmal atrial fibrillation (HCC)   . Other emphysema ([place])   . Bronchiectasis without complication ([place])   . Other specified hypothyroidism   . Fever 08/18/2014  . Sepsis ([place]) 08/18/2014  . CKD (chronic kidney disease) stage 4, GFR 15-29 ml/min (HCC) 08/18/2014  . Acute on chronic systolic heart failure ([place]) 08/18/2014  . Bronchiectasis ([place]) 08/18/2014  . Hypothyroidism 08/18/2014  . Bronchiectasis without acute exacerbation ([place]) 12/29/2013  . LVH (left ventricular hypertrophy) due to hypertensive disease 12/10/2013  . Atrial fibrillation ([place]) 12/08/2013  . CHF (congestive heart failure) ([place]) 12/07/2013  . Chronic obstructive airway disease with asthma ([place]) 12/07/2013  . Thyroid goiter 02/01/2013  . Low TSH level 05/18/2012  . Right thyroid nodule 05/18/2012  . Diabetes ([place]) 05/18/2012  . HLD (hyperlipidemia) 05/18/2012  . Rheumatoid arthritis ([place]) 05/18/2012  . Essential hypertension 05/18/2012  . GERD (gastroesophageal reflux disease) 05/18/2012  . Anemia 05/18/2012  . Knee pain 02/12/2012   Discharge Diagnoses:   Patient Active Problem List   Diagnosis Date Noted  . Coronary artery disease involving native heart without angina pectoris   . AKI (acute kidney injury) ([place])   . Dysphagia   . Chronic diastolic heart  failure ([place])   . Benign prostatic hyperplasia   . Hx of CABG   . S/P CABG (coronary artery bypass graft)   . Acute blood loss anemia   . Anemia of chronic disease   . Chronic obstructive pulmonary disease ([place])   . Cardiogenic shock ([place])   . Spontaneous pneumothorax   . Acute respiratory failure with hypercapnia ([place])   . Acute renal failure ([place])   . S/P CABG x 2 + maze procedure 10/09/2016  . S/P Maze operation for atrial fibrillation 10/09/2016  . Coronary artery disease involving native coronary artery of native heart with angina pectoris ([place]) 09/30/2016  . Fusarium infection ([place]) 01/10/2016  . Dyspnea on exertion 09/12/2015  . Sepsis due to undetermined organism ([place])   . Chronic systolic CHF (congestive heart failure) ([place])   . Chronic kidney disease (CKD), stage IV (severe) ([place])   . Cellulitis of left lower extremity   . Pulmonary hypertension ([place])   . Paroxysmal atrial fibrillation (HCC)   . Other emphysema ([place])   . Bronchiectasis without complication ([place])   . Other specified hypothyroidism   . Fever 08/18/2014  . Sepsis ([place]) 08/18/2014  . CKD (chronic kidney disease) stage 4, GFR 15-29 ml/min (HCC) 08/18/2014  . Acute on chronic systolic heart failure ([place]) 08/18/2014  . Bronchiectasis ([place]) 08/18/2014  . Hypothyroidism 08/18/2014  . Bronchiectasis without acute exacerbation ([place]) 12/29/2013  . LVH (left ventricular hypertrophy) due to hypertensive disease 12/10/2013  . Atrial fibrillation ([place]) 12/08/2013  . CHF (congestive heart failure) ([place]) 12/07/2013  . Chronic obstructive airway disease with asthma ([place]) 12/07/2013  . Thyroid goiter 02/01/2013  . Low  TSH level 05/18/2012  . Right thyroid nodule 05/18/2012  . Diabetes ([place]) 05/18/2012  . HLD (hyperlipidemia) 05/18/2012  . Rheumatoid arthritis ([place]) 05/18/2012  . Essential hypertension 05/18/2012  . GERD (gastroesophageal reflux disease) 05/18/2012  . Anemia 05/18/2012  . Knee pain 02/12/2012    Discharged Condition: good  History of Present Illness:  [name] is an 83 yo male with known history of HTN, Chronic diastolic CHF, Stage IV CKD, Hyperlipidemia, COPD, Type 2 DM, and PAF on long term anticoagulation. The patient has no previous history of coronary artery disease. He has history of paroxysmal atrial fibrillation dating back several years for which she has been followed by Dr. Einar Gip. The patient states that he first began to experience significant exertional shortness of breath several yearsago. In 2015 an echocardiogram was performed demonstrating severe diastolic dysfunction. Noninvasive stress testing revealed resting ejection fraction 44%. No signs of ischemia. Medical therapy was adjusted. In November 2017 he was hospitalized with acute exacerbation of symptoms of dyspnea on exertion, orthopnea, and lower extremity edema. Immediately prior to admission he had run out of his diuretic medications. Symptoms improved with diuresis. Over the past year the patient states that symptoms have progressed such that he now gets short of breath with very low level activity and occasionally at rest. He has episodes of orthopnea without PND. He has had chronic lower extremity edema. He was subsequently scheduled for elective diagnostic cardiac catheterization which was performed 09/30/2016 and was notable for 90% stenosis of the left main coronary artery. Cardiothoracic surgical consultation was requested.  He presented to [facility] and was evaluated by Dr. Roxy Manns who felt coronary bypass procedure and MAZE procedure would be indicated.  The risks and benefits of the procedure were explained to the patient and he was agreeable to proceed.     Hospital Course:   [name] presented to [facility] on 10/09/2016.  He was taken to the operating room and underwent CABG x 2 utilizing LIMA to LAD, SVG to OM.  MAZE procedure with Clipping of Left Atrial Appendage.  Endoscopic harvest of greater  saphenous vein from the right thigh.  He tolerated the procedure without difficulty and was taken to the SICU in stable condition.  Post operatively he was transfused a unit of packed cells for post operative blood loss anemia.  During his stay in the SICU the patient was weaned and extubated.  He was weaned off levophed and milrinone as tolerated.  He was started on Lasix drip for hypervolemia.  He was started on low dose coumadin for his MAZE procedure.  He remained on IV amiodarone post operatively until POD #2.  His chest tubes and arterial lines were removed without difficulty.  He was maintaining NSR and his Amiodarone was converted to an oral regimen.  However he ultimately converted into rate controlled Atrial Fibrillation.  He was started on Coumadin for this.    He had history of CKD Stage 3/4 and was placed on a lasix drip with marginal urinary output.  He became more weakened, fatigued with increasing creatinine and poor urinary output.  Ultimately Nephrology consult was obtained.  They felt that if pressors did not increase patient's blood pressure response he would likely require CRRT. Patient did not progress with pressors.  He required CRRT which was implemented on POD # 6.  The patients status continued to decline.  He went into respiratory failure due to pulmonary edema.  He was started on BIPAP and CCM was consulted.  The patient required intubation.  He had suffered bowel distention which worsened.  KUB was obtained and confirmed ileus.  He was started on trickle tube feeds due to this and re-intubation.  The patient developed hypotension despite increase in pressors and hypoxia despite adjustment of vent.  Workup showed elevated lactate level and pneumothorax on the right.  Chest tube placement was indicated.  However during placement patient became more unstable requiring emergent needle decompression.  This remained in place until chest tube was in place.  Patient tolerated procedure.  He was  placed on empiric ABX for possible HCAP.  He developed shock liver with resulting elevation in INR and coags.  He was taken off Heparin and Coumadin and watched closely.  He remained on Amiodarone for now to aid in maintaining NSR.  The patient remained on pressors and CRRT for several days which were weaned off as tolerated.  The patient became more alert and responded appropriately on vent.  He was started on short breathing trials on POD #11.  He tolerated these well and was extubated later that day.  His INR and liver functions had improved.  He was restarted on low dose Coumadin therapy for Atrial Fibrillation.  His pacing wires were removed on POD #12.  He developed multiple loose stools felt to be related to tube feedings.  C. Diff was checked and was negative.  Speech and Language pathology evaluated patient and he was felt to be an increased risk for aspiration and he was to remain NPO.  FEES study was done and patient was cleared to start a dysphagia diet on POD #14.  He was taken off CRRT on 10/24/2016.  Unfortunately there was no urinary output and patient ultimately was started on dialysis on 10/25/2016.  He continued to have poor oral intake despite start of diet.  His tube feeds were adjusted to nightly administration.  He remained weak and was found to be anemic.  He was transfused 2 units of packed cells on POD #15.  Vascular surgery consult was obtained for placement of tunneled dialysis catheter.  This was completed on 10/28/2016.  The patient continued to make slow progress.  He remains on tube feeds which can hopefully be stopped as patients oral intake improves.  He is maintaining NSR. He remains on 5 mg of Coumadin with an up to INR of 1.65 as of 11/03/2016.  He will be discharged on 2.5 mg daily. He is actively participating with PT/OT who have recommended CIR placement.  He remains on dialysis and is tolerating without difficulty he will continue dialysis every MWF.  He continues to have  liquid stools which have been improving with time.  These should subside once tube feeds are discontinued.  Regarding his diet, he is currently on dysphagia 3 (mechanical soft, honey thick liquids, with fluid restriction of 1200 ml and whole medications with puree).He is felt to be medically stable for discharge to CIR today.                                  Significant Diagnostic Studies: angiography:   Ostial 90% calcified left main stenosis. 50% proximal LAD calcified stenosis. Rest mild nonobstructive disease. Mild pulmonary hypertension.  Treatments: surgery:    Coronary Artery Bypass Grafting x 2  Left Internal Mammary Artery to Distal Left Anterior Descending Coronary Artery Saphenous Vein Graft to Obtuse Marginal Branch of Left Circumflex Coronary Artery Endoscopic Vein  Harvest from Right Thigh    Maze Procedure  Complete bilateral atrial lesion set using bipolar radiofrequency and cryothermy ablation Clipping of LA appendage (Atriclip, size 6mm)  Disposition:To CIR  Discharge Medications:  The patient has been discharged on:   1.Beta Blocker:  Yes [ x  ]                              No   [   ]                              If No, reason:  2.Ace Inhibitor/ARB: Yes [   ]                                     No  [  X  ]                                     If No, reason: ESRD 3.Statin:   Yes [ x  ]                  No  [   ]                  If No, reason:  4.Ecasa:  Yes  [ x  ]                  No   [   ]                  If No, reason:    Allergies as of 11/03/2016   No Known Allergies     Medication List    STOP taking these medications   ferrous sulfate 325 (65 FE) MG tablet   folic acid 1 MG tablet Commonly known as:  FOLVITE   furosemide 80 MG tablet Commonly known as:  LASIX   HYDROcodone-acetaminophen 5-325 MG tablet Commonly known as:  NORCO   isosorbide-hydrALAZINE 20-37.5 MG tablet Commonly known as:  BIDIL   leflunomide 20 MG  tablet Commonly known as:  ARAVA   MUCINEX 600 MG 12 hr tablet Generic drug:  guaiFENesin Replaced by:  guaiFENesin 100 MG/5ML Soln     TAKE these medications   abatacept 250 MG injection Commonly known as:  ORENCIA Infusion due again October 2018 but with recent heart surgery, would wait until at least November What changed:  medication strength  how to take this  when to take this  additional instructions   acetaminophen 325 MG tablet Commonly known as:  TYLENOL Take 2 tablets (650 mg total) by mouth every 6 (six) hours as needed for mild pain.   albuterol (2.5 MG/3ML) 0.083% nebulizer solution Commonly known as:  PROVENTIL Take 3 mLs (2.5 mg total) by nebulization every 6 (six) hours as needed for wheezing or shortness of breath.   amiodarone 200 MG tablet Commonly known as:  PACERONE Take 1 tablet (200 mg total) by mouth every 12 (twelve) hours. For one week then take Amiodarone 200 mg by mouth daily thereafter   aspirin 81 MG chewable tablet Chew 1 tablet (81 mg total) by mouth daily.   atorvastatin 40 MG tablet Commonly known as:  LIPITOR Take  1 tablet (40 mg total) by mouth daily at 6 PM. What changed:  when to take this   camphor-menthol lotion Commonly known as:  SARNA Apply topically as needed for itching.   diphenhydrAMINE 25 mg capsule Commonly known as:  BENADRYL Take 1 capsule (25 mg total) by mouth every 6 (six) hours as needed for itching or sleep.   feeding supplement (GLUCERNA SHAKE) Liqd Take 237 mLs by mouth 2 (two) times daily between meals.   finasteride 5 MG tablet Commonly known as:  PROSCAR Take 5 mg by mouth at bedtime.   fluticasone furoate-vilanterol 100-25 MCG/INH Aepb Commonly known as:  BREO ELLIPTA Inhale 1 puff into the lungs daily.   gabapentin 300 MG capsule Commonly known as:  NEURONTIN Take 1 capsule (300 mg total) by mouth 2 (two) times daily.   guaiFENesin 100 MG/5ML Soln Commonly known as:  ROBITUSSIN Take 20  mLs (400 mg total) by mouth every 4 (four) hours as needed for cough or to loosen phlegm. Replaces:  MUCINEX 600 MG 12 hr tablet   hydrocortisone cream 0.5 % Apply topically 2 (two) times daily.   levothyroxine 25 MCG tablet Commonly known as:  SYNTHROID, LEVOTHROID Take 25 mcg by mouth daily before breakfast.   metoprolol tartrate 25 MG tablet Commonly known as:  LOPRESSOR Take 0.5 tablets (12.5 mg total) by mouth 2 (two) times daily. What changed:  medication strength  how much to take   multivitamin Tabs tablet Take 1 tablet by mouth at bedtime.   ranitidine 150 MG tablet Commonly known as:  ZANTAC Take 1 tablet (150 mg total) by mouth daily. What changed:  when to take this   tamsulosin 0.4 MG Caps capsule Commonly known as:  FLOMAX Take 0.4 mg by mouth at bedtime.   TOUJEO SOLOSTAR 300 UNIT/ML Sopn Generic drug:  Insulin Glargine Inject 14 Units into the skin at bedtime.   traMADol 50 MG tablet Commonly known as:  ULTRAM Take 1 tablet (50 mg total) by mouth every 12 (twelve) hours as needed for severe pain.   traZODone 50 MG tablet Commonly known as:  DESYREL Take 0.5 tablets (25 mg total) by mouth at bedtime as needed for sleep. What changed:  how much to take   warfarin 2.5 MG tablet Commonly known as:  COUMADIN Take 1 tablet (2.5 mg total) by mouth daily at 6 PM.      Follow-up Information    Adrian Prows, MD Follow up on 11/18/2016.   Specialty:  Cardiology Why:  Appointment is at 4:00 Contact information: [address] [place] [address] [phone]        Rexene Alberts, MD Follow up on 11/24/2016.   Specialty:  Cardiothoracic Surgery Why:  Appointment is at 10:00, please get CXR at 9:30 at [facility] located on first floor of our office building  Contact information: [place] [address] [phone]        Angelia Mould, MD Follow up in 6 week(s).   Specialties:   Vascular Surgery, Cardiology Why:  office will call Contact information: [address] [place] [address] [phone]           Signed: Nani Skillern 11/03/2016, 1:25 PM

## 2016-10-10 NOTE — Progress Notes (Signed)
Pt flipped to CPAP/PS by respiratory therapist per RWP. Will continue to monitor closely.

## 2016-10-10 NOTE — Progress Notes (Signed)
Dr. Cyndia Bent called, notified of pt's ABG and mechanics after 58min on CPAP/PS. Gave orders to give 1amp bicarb and extubate. Respiratory notified.

## 2016-10-10 NOTE — Progress Notes (Signed)
Extubated to 4L [place] with respiratory and this RN per Dr. Vivi Martens order. Pt able to vocalize name and cough on command. Practiced IS w/pt, able to pull 500 x7. Will continue pulmonary toilet. Will continue to monitor closely.

## 2016-10-10 NOTE — Progress Notes (Signed)
Respiratory flipped pt's vent settings to 40/4 per RWP at 01:30. Pt appears to be tolerating well so far. Will continue to monitor closely.

## 2016-10-10 NOTE — Progress Notes (Signed)
[address]       [place] [address]             [phone]        CARDIOTHORACIC SURGERY PROGRESS NOTE   R1 Day Post-Op Procedure(s) (LRB): CORONARY ARTERY BYPASS GRAFTING (CABG) x two, using left internal mammary artery and right leg greater saphenous vein harvested endoscopically (N/A) MAZE (N/A) TRANSESOPHAGEAL ECHOCARDIOGRAM (TEE) (N/A) CLIPPING OF ATRIAL APPENDAGE  Subjective: Alert and awake in bed this morning. Complaints of mild incisional pain.No SOB or CP.  Objective: Vital signs: BP Readings from Last 1 Encounters:  10/10/16 (!) 124/57   Pulse Readings from Last 1 Encounters:  10/10/16 80   Resp Readings from Last 1 Encounters:  10/10/16 13   Temp Readings from Last 1 Encounters:  10/10/16 (!) 97.5 F (36.4 C)    Hemodynamics: PAP: (30-60)/(16-28) 47/18 CO:  [2.8 L/min-5.9 L/min] 5 L/min CI:  [1.3 L/min/m2-2.8 L/min/m2] 2.4 L/min/m2  Physical Exam:  Rhythm:   Atrial Paced at 80 bpm  Breath sounds: Diminished bilaterally  Heart sounds:  RRR, no m/r/g  Incisions:  Surgical dressing over median sternotomy incision  Abdomen:  Soft, Non-tender, obese  Extremities:  1+ pitting edema bilaterally, SCI in place   Intake/Output from previous day: 09/27 0701 - 09/28 0700 In: 7344.2 [P.O.:90; I.V.:4589.2; Blood:715; NG/GT:100; IV Piggyback:1850] Out: 5035 [Urine:1706; Emesis/NG output:50; Blood:800; Chest Tube:580] Intake/Output this shift: No intake/output data recorded.  Lab Results:  CBC:  Recent Labs  10/09/16 2054 10/09/16 2101 10/10/16 0349  WBC 18.4*  --  14.8*  HGB 10.5* 10.2* 10.1*  HCT 32.1* 30.0* 29.5*  PLT 114*  --  106*    BMET:   Recent Labs  10/09/16 2101 10/10/16 0349  NA 140 140  K 4.1 3.4*  CL 106 108  CO2  --  25  GLUCOSE 156* 109*  BUN 29* 31*  CREATININE 2.20* 2.37*  CALCIUM  --  7.8*     PT/INR:    Recent Labs  10/09/16 1520  LABPROT 16.9*  INR 1.38    CBG (last 3)   Recent  Labs  10/10/16 0459 10/10/16 0559 10/10/16 0654  GLUCAP 96 88 104*    ABG    Component Value Date/Time   PHART 7.301 (L) 10/10/2016 0354   PCO2ART 48.1 (H) 10/10/2016 0354   PO2ART 104.0 10/10/2016 0354   HCO3 23.8 10/10/2016 0354   TCO2 25 10/10/2016 0354   ACIDBASEDEF 3.0 (H) 10/10/2016 0354   O2SAT 73.2 10/10/2016 0410    CXR: PORTABLE CHEST 1 VIEW  COMPARISON:  10/09/2016  FINDINGS: Cardiac shadow is enlarged. Postsurgical changes are again seen. Nasogastric catheter has been removed. Swan-Ganz catheter, mediastinal drain and left thoracostomy catheter remain in place. Endotracheal tube is also been removed. The lungs are well aerated with minimal bibasilar atelectasis. No pleural effusion or pneumothorax is seen.  IMPRESSION: Mild bibasilar atelectasis.  Tubes and lines as described above.  No pneumothorax is noted.   Electronically Signed   By: Inez Catalina M.D.   On: 10/10/2016 07:59  Assessment/Plan: S/P Procedure(s) (LRB): CORONARY ARTERY BYPASS GRAFTING (CABG) x two, using left internal mammary artery and right leg greater saphenous vein harvested endoscopically (N/A) MAZE (N/A) TRANSESOPHAGEAL ECHOCARDIOGRAM (TEE) (N/A) CLIPPING OF ATRIAL APPENDAGE  Doing well POD1  Cardiovascular - Hemodynamically stable currently. BP 120/55 still on 0.2 mcg/min Milrinone, Atrial Paced currently on 30mg /min amiodarone. Will wean drips as tolerated. Will start on 2.5mg  Warfarin and  monitor INR.   Pulmonary - Sp02 99% on 2L [place] without complaints of SOB. CXR shows mild bibasilar atelectasis. 580 ccs from CT in last 24 hours, these will remain. Encouraged IS use and mobilization.   Anemia - This is expected in the post-operative period. H&H 10.1 and 29.5 this morning.   Fluid Overload - This is expected in the post-operative period. Hew is currently 8kg above his pre-operative weight of 98.9kg. Can begin to diuresis once BP stable. I/O +42008 in last 24 hours.  Encouraged mobilization.   CBG - 96/88/104, Continue ACHS glucose checks and SSI PRN. Pre-operative HgbA1c 7.2  CKD - History of Stage 3/4 CKD at baseline. BUN and sCr are 31 and 2.37 today, which is around his baseline. Will continue to monitor and adjust medications appropriately.   Wean drips as tolerated Mobilize D/C Gordy Councilman, ART/Central Line should remain while on Milrinone/Amiodarone   Edison Simon, PA-S 10/10/2016 8:25 AM  I have seen and examined the patient and agree with the assessment and plan as outlined.  Overall doing well POD1.  Maintaining NSR w/ stable hemodynamics off all drips except low dose milrinone @ 0.3.  Mobilize.  D/C Swan-ganz.  Wean milrinone slowly.  Continue amiodarone IV today.  Start lasix drip.  Start Coumadin slowly.  Rexene Alberts, MD 10/10/2016 8:32 AM

## 2016-10-10 NOTE — Progress Notes (Signed)
Per cardiac wean protocol

## 2016-10-11 ENCOUNTER — Inpatient Hospital Stay (HOSPITAL_COMMUNITY): Payer: Medicare Other

## 2016-10-11 LAB — CBC
HCT: 28.3 % — ABNORMAL LOW (ref 39.0–52.0)
HEMOGLOBIN: 9.4 g/dL — AB (ref 13.0–17.0)
MCH: 29.2 pg (ref 26.0–34.0)
MCHC: 33.2 g/dL (ref 30.0–36.0)
MCV: 87.9 fL (ref 78.0–100.0)
PLATELETS: 99 10*3/uL — AB (ref 150–400)
RBC: 3.22 MIL/uL — AB (ref 4.22–5.81)
RDW: 15.5 % (ref 11.5–15.5)
WBC: 15.2 10*3/uL — AB (ref 4.0–10.5)

## 2016-10-11 LAB — GLUCOSE, CAPILLARY
GLUCOSE-CAPILLARY: 174 mg/dL — AB (ref 65–99)
GLUCOSE-CAPILLARY: 106 mg/dL — AB (ref 65–99)
Glucose-Capillary: 152 mg/dL — ABNORMAL HIGH (ref 65–99)
Glucose-Capillary: 174 mg/dL — ABNORMAL HIGH (ref 65–99)
Glucose-Capillary: 161 mg/dL — ABNORMAL HIGH (ref 65–99)
Glucose-Capillary: 110 mg/dL — ABNORMAL HIGH (ref 65–99)
Glucose-Capillary: 130 mg/dL — ABNORMAL HIGH (ref 65–99)

## 2016-10-11 LAB — BASIC METABOLIC PANEL
ANION GAP: 9 (ref 5–15)
BUN: 30 mg/dL — ABNORMAL HIGH (ref 6–20)
CALCIUM: 7.9 mg/dL — AB (ref 8.9–10.3)
CO2: 25 mmol/L (ref 22–32)
Chloride: 101 mmol/L (ref 101–111)
Creatinine, Ser: 2.63 mg/dL — ABNORMAL HIGH (ref 0.61–1.24)
GFR calc Af Amer: 24 mL/min — ABNORMAL LOW (ref >60)
GFR, EST NON AFRICAN AMERICAN: 21 mL/min — AB (ref >60)
Glucose, Bld: 157 mg/dL — ABNORMAL HIGH (ref 65–99)
Potassium: 3.7 mmol/L (ref 3.5–5.1)
SODIUM: 135 mmol/L (ref 135–145)

## 2016-10-11 LAB — PROTIME-INR
INR: 1.23
PROTHROMBIN TIME: 15.4 s — AB (ref 11.4–15.2)

## 2016-10-11 MED ORDER — AMIODARONE HCL 200 MG PO TABS
200.0000 mg | ORAL_TABLET | Freq: Two times a day (BID) | ORAL | Status: DC
  Administered 2016-10-11 – 2016-10-12 (×3): 200 mg via ORAL
  Filled 2016-10-11 (×3): qty 1

## 2016-10-11 MED ORDER — AMIODARONE HCL 200 MG PO TABS: 200.0000 mg | ORAL_TABLET | Freq: Every day | ORAL | Status: DC
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
## 2016-10-11 NOTE — Progress Notes (Signed)
Patient ID: KAIROS PANETTA, male   DOB: 01/06/1931, 83 y.o.   MRN: [id number] T[facility] DAILY ICU PROGRESS NOTE                   [address]            [place] [address]          [phone]   2 Days Post-Op Procedure(s) (LRB): CORONARY ARTERY BYPASS GRAFTING (CABG) x two, using left internal mammary artery and right leg greater saphenous vein harvested endoscopically (N/A) MAZE (N/A) TRANSESOPHAGEAL ECHOCARDIOGRAM (TEE) (N/A) CLIPPING OF ATRIAL APPENDAGE  Total Length of Stay:  LOS: 2 days   Subjective: Up to chair ,alert and neuro intact  Objective: Vital signs in last 24 hours: Temp:  [97.5 F (36.4 C)-98.2 F (36.8 C)] 97.9 F (36.6 C) (09/29 0734) Pulse Rate:  [79-82] 81 (09/29 0700) Cardiac Rhythm: Atrial paced (09/29 0400) Resp:  [11-25] 23 (09/29 0700) BP: (107-141)/(54-70) 141/66 (09/29 0700) SpO2:  [96 %-98 %] 96 % (09/29 0700) Arterial Line BP: (143)/(47) 143/47 (09/28 0945) Weight:  [238 lb 4.8 oz (108.1 kg)] 238 lb 4.8 oz (108.1 kg) (09/29 0500)  Filed Weights   10/09/16 0619 10/10/16 0500 10/11/16 0500  Weight: 218 lb (98.9 kg) 235 lb 10.8 oz (106.9 kg) 238 lb 4.8 oz (108.1 kg)    Weight change: 2 lb 10.1 oz (1.192 kg)   Hemodynamic parameters for last 24 hours: PAP: (47)/(18) 47/18  Intake/Output from previous day: 09/28 0701 - 09/29 0700 In: 2434.1 [P.O.:1280; I.V.:1004.1; IV Piggyback:150] Out: 2270 [Urine:1790; Chest Tube:480]  Intake/Output this shift: Total I/O In: -  Out: 155 [Urine:125; Chest Tube:30]  Current Meds: Scheduled Meds: . acetaminophen  1,000 mg Oral Q6H  . aspirin EC  325 mg Oral Daily  . atorvastatin  40 mg Oral q1800  . bisacodyl  10 mg Oral Daily   Or  . bisacodyl  10 mg Rectal Daily  . Chlorhexidine Gluconate Cloth  6 each Topical Daily  . docusate sodium  200 mg Oral Daily  . enoxaparin (LOVENOX) injection  30 mg Subcutaneous QHS  . ferrous sulfate  325 mg Oral Q breakfast  . finasteride  5 mg Oral  QHS  . folic acid  1 mg Oral Daily  . gabapentin  300 mg Oral BID  . insulin aspart  0-24 Units Subcutaneous Q4H  . insulin detemir  20 Units Subcutaneous Daily  . levothyroxine  25 mcg Oral QAC breakfast  . metoprolol tartrate  12.5 mg Oral BID  . mupirocin ointment  1 application Nasal BID  . pantoprazole  40 mg Oral Daily  . patient's guide to using coumadin book   Does not apply Once  . sodium chloride flush  10-40 mL Intracatheter Q12H  . sodium chloride flush  3 mL Intravenous Q12H  . tamsulosin  0.4 mg Oral QHS  . warfarin  2.5 mg Oral q1800  . Warfarin - Physician Dosing Inpatient   Does not apply q1800   Continuous Infusions: . sodium chloride    . amiodarone 30 mg/hr (10/10/16 2119)  . cefUROXime (ZINACEF)  IV Stopped (10/10/16 2300)  . furosemide (LASIX) infusion 10 mg/hr (10/11/16 0936)  . lactated ringers    . lactated ringers Stopped (10/09/16 1708)  . lactated ringers 20 mL/hr at 10/10/16 2000  . milrinone 0.2 mcg/kg/min (10/11/16 0222)   PRN Meds:.metoprolol tartrate, morphine injection, ondansetron (ZOFRAN) IV, oxyCODONE, sodium chloride flush, sodium chloride flush, traMADol, traZODone  General appearance: alert, cooperative and no distress Neurologic: intact Heart: regular rate and rhythm, S1, S2 normal, no murmur, click, rub or gallop Lungs: diminished breath sounds bibasilar Abdomen: soft, non-tender; bowel sounds normal; no masses,  no organomegaly Extremities: extremities normal, atraumatic, no cyanosis or edema and Homans sign is negative, no sign of DVT Wound: intact  Lab Results: CBC: Recent Labs  10/10/16 1559 10/11/16 0405  WBC 17.0* 15.2*  HGB 9.9* 9.4*  HCT 30.1* 28.3*  PLT 105* 99*   BMET:  Recent Labs  10/10/16 0349 10/10/16 1537 10/10/16 1559 10/11/16 0405  NA 140 137  --  135  K 3.4* 3.9  --  3.7  CL 108 103  --  101  CO2 25  --   --  25  GLUCOSE 109* 153*  --  157*  BUN 31* 29*  --  30*  CREATININE 2.37* 2.40* 2.49* 2.63*    CALCIUM 7.8*  --   --  7.9*    CMET: Lab Results  Component Value Date   WBC 15.2 (H) 10/11/2016   HGB 9.4 (L) 10/11/2016   HCT 28.3 (L) 10/11/2016   PLT 99 (L) 10/11/2016   GLUCOSE 157 (H) 10/11/2016   CHOL 179 08/20/2014   TRIG 105 08/20/2014   HDL 30 (L) 08/20/2014   LDLCALC 128 (H) 08/20/2014   ALT 15 (L) 10/06/2016   AST 19 10/06/2016   NA 135 10/11/2016   K 3.7 10/11/2016   CL 101 10/11/2016   CREATININE 2.63 (H) 10/11/2016   BUN 30 (H) 10/11/2016   CO2 25 10/11/2016   TSH 6.623 (H) 10/01/2016   INR 1.23 10/11/2016   HGBA1C 7.2 (H) 10/06/2016      PT/INR:  Recent Labs  10/11/16 0405  LABPROT 15.4*  INR 1.23   Radiology: Dg Chest Port 1 View  Result Date: 10/11/2016 CLINICAL DATA:  Status post cardiac surgery. EXAM: PORTABLE CHEST 1 VIEW COMPARISON:  10/10/2016 and prior radiographs FINDINGS: Cardiomegaly, cardiac postsurgical changes and left atrial clip again noted. Mediastinal and left thoracostomy tubes are again noted. Swan-Ganz catheter has been removed with right IJ central venous catheter sheath remaining. Mild pulmonary vascular congestion and scattered areas of atelectasis again noted. There is no evidence of pneumothorax. IMPRESSION: Removal of Swan-Ganz catheter, otherwise no significant change. No evidence of pneumothorax. Electronically Signed   By: Margarette Canada M.D.   On: 10/11/2016 08:16    Assessment/Plan: S/P Procedure(s) (LRB): CORONARY ARTERY BYPASS GRAFTING (CABG) x two, using left internal mammary artery and right leg greater saphenous vein harvested endoscopically (N/A) MAZE (N/A) TRANSESOPHAGEAL ECHOCARDIOGRAM (TEE) (N/A) CLIPPING OF ATRIAL APPENDAGE Mobilize Diuresis Diabetes control d/c tubes/lines On low dose milrinone and lasix drip Cr 2.6 close to baseline D/c chest tubes  Leave  foley to monitor uop on lasix drip  Convert to  po amiodarone   Grace Isaac 10/11/2016 9:42 AM

## 2016-10-11 NOTE — Progress Notes (Signed)
Patient ID: [name], male   DOB: 1930/03/11, 83 y.o.   MRN: [id number] EVENING ROUNDS NOTE :     [address]       [place] [address]             [phone]                 2 Days Post-Op Procedure(s) (LRB): CORONARY ARTERY BYPASS GRAFTING (CABG) x two, using left internal mammary artery and right leg greater saphenous vein harvested endoscopically (N/A) MAZE (N/A) TRANSESOPHAGEAL ECHOCARDIOGRAM (TEE) (N/A) CLIPPING OF ATRIAL APPENDAGE  Total Length of Stay:  LOS: 2 days  BP (!) 109/53   Pulse 83   Temp 98 F (36.7 C) (Oral)   Resp 13   Ht 5\' 8"  (1.727 m)   Wt 238 lb 4.8 oz (108.1 kg)   SpO2 99%   BMI 36.23 kg/m   .Intake/Output      09/29 0701 - 09/30 0700   P.O. 340   I.V. (mL/kg) 478.4 (4.4)   Total Intake(mL/kg) 818.4 (7.6)   Urine (mL/kg/hr) 475 (0.3)   Chest Tube 80   Total Output 555   Net +263.4         . sodium chloride    . furosemide (LASIX) infusion 10 mg/hr (10/11/16 0936)  . lactated ringers    . lactated ringers Stopped (10/09/16 1708)  . lactated ringers 20 mL/hr (10/11/16 0600)  . milrinone 0.2 mcg/kg/min (10/11/16 1738)     Lab Results  Component Value Date   WBC 15.2 (H) 10/11/2016   HGB 9.4 (L) 10/11/2016   HCT 28.3 (L) 10/11/2016   PLT 99 (L) 10/11/2016   GLUCOSE 157 (H) 10/11/2016   CHOL 179 08/20/2014   TRIG 105 08/20/2014   HDL 30 (L) 08/20/2014   LDLCALC 128 (H) 08/20/2014   ALT 15 (L) 10/06/2016   AST 19 10/06/2016   NA 135 10/11/2016   K 3.7 10/11/2016   CL 101 10/11/2016   CREATININE 2.63 (H) 10/11/2016   BUN 30 (H) 10/11/2016   CO2 25 10/11/2016   TSH 6.623 (H) 10/01/2016   INR 1.23 10/11/2016   HGBA1C 7.2 (H) 10/06/2016   Lab Results  Component Value Date   INR 1.23 10/11/2016   INR 1.38 10/09/2016   INR 1.00 10/06/2016    Moved rooms for staffing assignment  Stable day  In afib controlled rate   Grace Isaac MD  Beeper [phone] Office [phone] 10/11/2016 8:07 PM

## 2016-10-12 ENCOUNTER — Inpatient Hospital Stay (HOSPITAL_COMMUNITY): Payer: Medicare Other

## 2016-10-12 LAB — GLUCOSE, CAPILLARY
GLUCOSE-CAPILLARY: 156 mg/dL — AB (ref 65–99)
GLUCOSE-CAPILLARY: 221 mg/dL — AB (ref 65–99)
GLUCOSE-CAPILLARY: 212 mg/dL — AB (ref 65–99)
GLUCOSE-CAPILLARY: 112 mg/dL — AB (ref 65–99)
Glucose-Capillary: 100 mg/dL — ABNORMAL HIGH (ref 65–99)
Glucose-Capillary: 179 mg/dL — ABNORMAL HIGH (ref 65–99)
Glucose-Capillary: 132 mg/dL — ABNORMAL HIGH (ref 65–99)
Glucose-Capillary: 102 mg/dL — ABNORMAL HIGH (ref 65–99)

## 2016-10-12 LAB — BASIC METABOLIC PANEL
Anion gap: 8 (ref 5–15)
BUN: 33 mg/dL — ABNORMAL HIGH (ref 6–20)
CO2: 26 mmol/L (ref 22–32)
Calcium: 8 mg/dL — ABNORMAL LOW (ref 8.9–10.3)
Chloride: 100 mmol/L — ABNORMAL LOW (ref 101–111)
Creatinine, Ser: 3.1 mg/dL — ABNORMAL HIGH (ref 0.61–1.24)
GFR calc Af Amer: 20 mL/min — ABNORMAL LOW (ref >60)
GFR calc non Af Amer: 17 mL/min — ABNORMAL LOW (ref >60)
Glucose, Bld: 110 mg/dL — ABNORMAL HIGH (ref 65–99)
Potassium: 3 mmol/L — ABNORMAL LOW (ref 3.5–5.1)
Sodium: 134 mmol/L — ABNORMAL LOW (ref 135–145)

## 2016-10-12 LAB — PROTIME-INR
INR: 1.14
Prothrombin Time: 14.5 seconds (ref 11.4–15.2)

## 2016-10-12 LAB — MAGNESIUM: Magnesium: 2.2 mg/dL (ref 1.7–2.4)

## 2016-10-12 MED ORDER — AMIODARONE IV BOLUS ONLY 150 MG/100ML
150.0000 mg | Freq: Once | INTRAVENOUS | Status: AC
  Administered 2016-10-12: 150 mg via INTRAVENOUS

## 2016-10-12 MED ORDER — AMIODARONE HCL IN DEXTROSE 360-4.14 MG/200ML-% IV SOLN
INTRAVENOUS | Status: AC
Start: 2016-10-12 — End: 2016-10-12
  Filled 2016-10-12: qty 200

## 2016-10-12 MED ORDER — POTASSIUM CHLORIDE 10 MEQ/50ML IV SOLN
10.0000 meq | Freq: Once | INTRAVENOUS | Status: AC
  Administered 2016-10-12: 10 meq via INTRAVENOUS
  Filled 2016-10-12: qty 50

## 2016-10-12 NOTE — Progress Notes (Deleted)
Right groin sheath removed by Idelia Salm RN and Coletta Memos RN at [phone]. Pt educated prior to sheath removal and emergency equipment at the beside. Pressure held for 20 minutes, vital signs stable. After removal site remained a level 0. Pt educated about post sheath care. Will pass information to primary nurse and be available to assist if needed.

## 2016-10-12 NOTE — Progress Notes (Addendum)
[facility] DAILY ICU PROGRESS NOTE                   [address]            [place] [address]          [phone]   3 Days Post-Op Procedure(s) (LRB): CORONARY ARTERY BYPASS GRAFTING (CABG) x two, using left internal mammary artery and right leg greater saphenous vein harvested endoscopically (N/A) MAZE (N/A) TRANSESOPHAGEAL ECHOCARDIOGRAM (TEE) (N/A) CLIPPING OF ATRIAL APPENDAGE  Total Length of Stay:  LOS: 3 days   Subjective:  Mr. Caraveo is doing okay.  Complains of swelling in his hands.  Denies nausea, good pain control  Objective: Vital signs in last 24 hours: Temp:  [97.8 F (36.6 C)-98.6 F (37 C)] 98.6 F (37 C) (09/30 0700) Pulse Rate:  [79-107] 101 (09/30 0900) Cardiac Rhythm: Atrial fibrillation (09/30 0800) Resp:  [9-22] 15 (09/30 0900) BP: (100-138)/(53-80) 131/80 (09/30 0900) SpO2:  [93 %-99 %] 96 % (09/30 0900) Weight:  [232 lb 6.4 oz (105.4 kg)] 232 lb 6.4 oz (105.4 kg) (09/30 0500)  Filed Weights   10/10/16 0500 10/11/16 0500 10/12/16 0500  Weight: 235 lb 10.8 oz (106.9 kg) 238 lb 4.8 oz (108.1 kg) 232 lb 6.4 oz (105.4 kg)    Weight change: -5 lb 14.4 oz (-2.676 kg)   Intake/Output from previous day: 09/29 0701 - 09/30 0700 In: 1145.1 [P.O.:340; I.V.:805.1] Out: 2951 [Urine:1570; Chest Tube:260]  Intake/Output this shift: Total I/O In: 51.8 [I.V.:51.8] Out: -   Current Meds: Scheduled Meds: . acetaminophen  1,000 mg Oral Q6H  . amiodarone  200 mg Oral Q12H   Followed by  . [START ON 10/19/2016] amiodarone  200 mg Oral Daily  . aspirin EC  325 mg Oral Daily  . atorvastatin  40 mg Oral q1800  . bisacodyl  10 mg Oral Daily   Or  . bisacodyl  10 mg Rectal Daily  . Chlorhexidine Gluconate Cloth  6 each Topical Daily  . docusate sodium  200 mg Oral Daily  . enoxaparin (LOVENOX) injection  30 mg Subcutaneous QHS  . ferrous sulfate  325 mg Oral Q breakfast  . finasteride  5 mg Oral QHS  . folic acid  1 mg Oral Daily  .  gabapentin  300 mg Oral BID  . insulin aspart  0-24 Units Subcutaneous Q4H  . insulin detemir  20 Units Subcutaneous Daily  . levothyroxine  25 mcg Oral QAC breakfast  . metoprolol tartrate  12.5 mg Oral BID  . mupirocin ointment  1 application Nasal BID  . pantoprazole  40 mg Oral Daily  . patient's guide to using coumadin book   Does not apply Once  . sodium chloride flush  10-40 mL Intracatheter Q12H  . sodium chloride flush  3 mL Intravenous Q12H  . tamsulosin  0.4 mg Oral QHS  . warfarin  2.5 mg Oral q1800  . Warfarin - Physician Dosing Inpatient   Does not apply q1800   Continuous Infusions: . sodium chloride    . furosemide (LASIX) infusion 10 mg/hr (10/12/16 0700)  . lactated ringers    . lactated ringers Stopped (10/09/16 1708)  . lactated ringers 20 mL/hr (10/11/16 0600)  . milrinone 0.2 mcg/kg/min (10/12/16 0700)   PRN Meds:.metoprolol tartrate, morphine injection, ondansetron (ZOFRAN) IV, oxyCODONE, sodium chloride flush, sodium chloride flush, traMADol, traZODone  General appearance: alert, cooperative and no distress Heart: irregularly irregular rhythm Lungs: diminished breath sounds bibasilar  Abdomen: soft, non-tender; bowel sounds normal; no masses,  no organomegaly Extremities: edema trace in LE, + bilateral hands Wound: clean and dry  Lab Results: CBC: Recent Labs  10/10/16 1559 10/11/16 0405  WBC 17.0* 15.2*  HGB 9.9* 9.4*  HCT 30.1* 28.3*  PLT 105* 99*   BMET:  Recent Labs  10/11/16 0405 10/12/16 0414  NA 135 134*  K 3.7 3.0*  CL 101 100*  CO2 25 26  GLUCOSE 157* 110*  BUN 30* 33*  CREATININE 2.63* 3.10*  CALCIUM 7.9* 8.0*    CMET: Lab Results  Component Value Date   WBC 15.2 (H) 10/11/2016   HGB 9.4 (L) 10/11/2016   HCT 28.3 (L) 10/11/2016   PLT 99 (L) 10/11/2016   GLUCOSE 110 (H) 10/12/2016   CHOL 179 08/20/2014   TRIG 105 08/20/2014   HDL 30 (L) 08/20/2014   LDLCALC 128 (H) 08/20/2014   ALT 15 (L) 10/06/2016   AST 19  10/06/2016   NA 134 (L) 10/12/2016   K 3.0 (L) 10/12/2016   CL 100 (L) 10/12/2016   CREATININE 3.10 (H) 10/12/2016   BUN 33 (H) 10/12/2016   CO2 26 10/12/2016   TSH 6.623 (H) 10/01/2016   INR 1.14 10/12/2016   HGBA1C 7.2 (H) 10/06/2016      PT/INR:  Recent Labs  10/12/16 0414  LABPROT 14.5  INR 1.14   Radiology: Dg Chest Port 1 View  Result Date: 10/12/2016 CLINICAL DATA:  Chest tube in place EXAM: PORTABLE CHEST 1 VIEW COMPARISON:  10/11/2016 FINDINGS: Left chest tube has been removed.  No pneumothorax. Status post median sternotomy. Left atrial appendage clip appears stable in position. Right IJ sheath remains in place. There is minimal bibasilar atelectasis. No overt edema or new consolidation. IMPRESSION: Interval removal of left-sided chest tube.  No pneumothorax. Electronically Signed   By: Nolon Nations M.D.   On: 10/12/2016 10:05     Assessment/Plan: S/P Procedure(s) (LRB): CORONARY ARTERY BYPASS GRAFTING (CABG) x two, using left internal mammary artery and right leg greater saphenous vein harvested endoscopically (N/A) MAZE (N/A) TRANSESOPHAGEAL ECHOCARDIOGRAM (TEE) (N/A) CLIPPING OF ATRIAL APPENDAGE  1. CV- Chronic A. Fib, BP controlled- on Milrinone at 0.2- will wean as tolerated, continue oral Amiodarone 2. INR 1.14, continue coumadin at 2.5 if no response will increase to 5 mg tomorrow 3. Pulm- wean oxygen as tolerated, CXR with bilateral atelectasis, instructed on good use of IS 4. Renal- creatinine up to 3.10, on lasix drip currently good U/O 5. Hypokalemic- at 3.0, supplementation ordered, but must be careful with elevation in creatinine 6. Expected post operative blood loss anemia- mild Hgb 9.4 7. Thrombocytopenia- plt count at 99, monitor 8. Dispo- patien stable, wean Milrinone as tolerated, continue coumadin, Lasix gtt for now, watch creatinine continue current care     BARRETT, [place] 10/12/2016 10:31 AM   Cr up to 3.10 on lasix drip Hypoactive  bowel sounds , increased abdomnial distention from yesterday , will decease diet to clear liquids only for now  On dopamine  Chest lubes out  I have seen and examined Irena Cords and agree with the above assessment  and plan.  Grace Isaac MD [facility] [phone] Office [phone] 10/12/2016 6:02 PM

## 2016-10-12 NOTE — Progress Notes (Signed)
Patient having bursts of afib RVR up into the 130s-140s. Dr. Servando Snare made aware. Order for Amiodarone 150mg  IV bolus received. Will continue to closely monitor. Richarda Blade RN

## 2016-10-13 ENCOUNTER — Inpatient Hospital Stay (HOSPITAL_COMMUNITY): Payer: Medicare Other

## 2016-10-13 LAB — CBC
HCT: 25.4 % — ABNORMAL LOW (ref 39.0–52.0)
Hemoglobin: 8.6 g/dL — ABNORMAL LOW (ref 13.0–17.0)
MCH: 30.1 pg (ref 26.0–34.0)
MCHC: 33.9 g/dL (ref 30.0–36.0)
MCV: 88.8 fL (ref 78.0–100.0)
PLATELETS: 123 10*3/uL — AB (ref 150–400)
RBC: 2.86 MIL/uL — ABNORMAL LOW (ref 4.22–5.81)
RDW: 15 % (ref 11.5–15.5)
WBC: 15.3 10*3/uL — ABNORMAL HIGH (ref 4.0–10.5)

## 2016-10-13 LAB — GLUCOSE, CAPILLARY
GLUCOSE-CAPILLARY: 238 mg/dL — AB (ref 65–99)
GLUCOSE-CAPILLARY: 79 mg/dL (ref 65–99)
GLUCOSE-CAPILLARY: 60 mg/dL — AB (ref 65–99)
Glucose-Capillary: 103 mg/dL — ABNORMAL HIGH (ref 65–99)
Glucose-Capillary: 131 mg/dL — ABNORMAL HIGH (ref 65–99)
Glucose-Capillary: 238 mg/dL — ABNORMAL HIGH (ref 65–99)

## 2016-10-13 LAB — BASIC METABOLIC PANEL
Anion gap: 12 (ref 5–15)
BUN: 39 mg/dL — ABNORMAL HIGH (ref 6–20)
CO2: 23 mmol/L (ref 22–32)
Calcium: 7.7 mg/dL — ABNORMAL LOW (ref 8.9–10.3)
Chloride: 96 mmol/L — ABNORMAL LOW (ref 101–111)
Creatinine, Ser: 3.41 mg/dL — ABNORMAL HIGH (ref 0.61–1.24)
GFR calc Af Amer: 18 mL/min — ABNORMAL LOW (ref >60)
GFR calc non Af Amer: 15 mL/min — ABNORMAL LOW (ref >60)
Glucose, Bld: 92 mg/dL (ref 65–99)
Potassium: 3 mmol/L — ABNORMAL LOW (ref 3.5–5.1)
Sodium: 131 mmol/L — ABNORMAL LOW (ref 135–145)

## 2016-10-13 LAB — COOXEMETRY PANEL
CARBOXYHEMOGLOBIN: 0.9 % (ref 0.5–1.5)
Methemoglobin: 1.2 % (ref 0.0–1.5)
O2 Saturation: 57.2 %
TOTAL HEMOGLOBIN: 15.2 g/dL (ref 12.0–16.0)

## 2016-10-13 LAB — PROTIME-INR
INR: 1.15
PROTHROMBIN TIME: 14.7 s (ref 11.4–15.2)

## 2016-10-13 MED ORDER — GUAIFENESIN ER 600 MG PO TB12
1200.0000 mg | ORAL_TABLET | Freq: Two times a day (BID) | ORAL | Status: DC
  Administered 2016-10-13 – 2016-10-24 (×14): 1200 mg via ORAL
  Filled 2016-10-13 (×16): qty 2

## 2016-10-13 MED ORDER — DEXTROSE 50 % IV SOLN
INTRAVENOUS | Status: AC
  Administered 2016-10-13: 50 mL
  Filled 2016-10-13: qty 50

## 2016-10-13 MED ORDER — POTASSIUM CHLORIDE 10 MEQ/50ML IV SOLN
10.0000 meq | INTRAVENOUS | Status: AC
  Administered 2016-10-13 (×6): 10 meq via INTRAVENOUS

## 2016-10-13 MED ORDER — WARFARIN SODIUM 5 MG PO TABS
5.0000 mg | ORAL_TABLET | Freq: Every day | ORAL | Status: DC
  Administered 2016-10-14 – 2016-10-15 (×2): 5 mg via ORAL
  Filled 2016-10-13 (×2): qty 1

## 2016-10-13 MED ORDER — SODIUM CHLORIDE 0.9% FLUSH
3.0000 mL | Freq: Two times a day (BID) | INTRAVENOUS | Status: DC
  Administered 2016-10-13 – 2016-10-23 (×14): 3 mL via INTRAVENOUS
  Administered 2016-10-24: 10 mL via INTRAVENOUS
  Administered 2016-10-24 – 2016-11-02 (×16): 3 mL via INTRAVENOUS

## 2016-10-13 MED ORDER — AMIODARONE HCL IN DEXTROSE 360-4.14 MG/200ML-% IV SOLN
30.0000 mg/h | INTRAVENOUS | Status: DC
  Administered 2016-10-13 – 2016-10-20 (×12): 30 mg/h via INTRAVENOUS
  Filled 2016-10-13 (×14): qty 200

## 2016-10-13 MED ORDER — METOPROLOL TARTRATE 25 MG PO TABS
25.0000 mg | ORAL_TABLET | Freq: Two times a day (BID) | ORAL | Status: DC
  Administered 2016-10-13 – 2016-10-14 (×2): 25 mg via ORAL
  Filled 2016-10-13 (×4): qty 1

## 2016-10-13 MED ORDER — SODIUM CHLORIDE 0.9% FLUSH
3.0000 mL | INTRAVENOUS | Status: DC | PRN
  Administered 2016-10-13: 3 mL via INTRAVENOUS
  Filled 2016-10-13: qty 3

## 2016-10-13 MED ORDER — SODIUM CHLORIDE 0.9 % IV SOLN: 250.0000 mL | INTRAVENOUS | Status: DC | PRN

## 2016-10-13 MED ORDER — AMIODARONE LOAD VIA INFUSION
150.0000 mg | Freq: Once | INTRAVENOUS | Status: AC
  Administered 2016-10-12: via INTRAVENOUS
  Filled 2016-10-13: qty 83.34

## 2016-10-13 MED ORDER — MOVING RIGHT ALONG BOOK
Freq: Once | Status: DC
  Filled 2016-10-13: qty 1

## 2016-10-13 MED ORDER — AMIODARONE HCL IN DEXTROSE 360-4.14 MG/200ML-% IV SOLN
60.0000 mg/h | INTRAVENOUS | Status: AC
  Administered 2016-10-13: 60 mg/h via INTRAVENOUS
  Filled 2016-10-13: qty 200

## 2016-10-13 MED ORDER — WARFARIN - PHARMACIST DOSING INPATIENT: Freq: Every day | Status: DC

## 2016-10-13 MED FILL — Lidocaine HCl IV Inj 20 MG/ML: INTRAVENOUS | Qty: 30 | Status: AC

## 2016-10-13 MED FILL — Mannitol IV Soln 20%: INTRAVENOUS | Qty: 1000 | Status: AC

## 2016-10-13 MED FILL — Heparin Sodium (Porcine) Inj 1000 Unit/ML: INTRAMUSCULAR | Qty: 20 | Status: AC

## 2016-10-13 MED FILL — Electrolyte-R (PH 7.4) Solution: INTRAVENOUS | Qty: 4000 | Status: AC

## 2016-10-13 MED FILL — Sodium Chloride IV Soln 0.9%: INTRAVENOUS | Qty: 2000 | Status: AC

## 2016-10-13 NOTE — Progress Notes (Signed)
[address]       [place] [address]             [phone]        CARDIOTHORACIC SURGERY PROGRESS NOTE   R4 Days Post-Op Procedure(s) (LRB): CORONARY ARTERY BYPASS GRAFTING (CABG) x two, using left internal mammary artery and right leg greater saphenous vein harvested endoscopically (N/A) MAZE (N/A) TRANSESOPHAGEAL ECHOCARDIOGRAM (TEE) (N/A) CLIPPING OF ATRIAL APPENDAGE  Subjective: Feeling good this morning. Notes some weakness, but no SOB or CP. Still not having a BM or Flatus.   Objective: Vital signs: BP Readings from Last 1 Encounters:  10/13/16 (!) 115/59   Pulse Readings from Last 1 Encounters:  10/13/16 91   Resp Readings from Last 1 Encounters:  10/13/16 (!) 9   Temp Readings from Last 1 Encounters:  10/13/16 98.2 F (36.8 C) (Oral)    Hemodynamics:    Physical Exam:  Rhythm:   Atrial Fibrillation at 100 bpm  Breath sounds: Crackles in the bases bilaterally  Heart sounds:  Irregularly Irregular  Incisions:  Median sternotomy incision is clean, dry, and intact without erythema or edema  Abdomen:  Non-tender, slightly distended, normoactive bowel sounds all 4 quadrants  Extremities:  2+ pitting edema bilateral LE   Intake/Output from previous day: 09/30 0701 - 10/01 0700 In: 585.7 [I.V.:585.7] Out: 1920 [Urine:1920] Intake/Output this shift: No intake/output data recorded.  Lab Results:  CBC:  Recent Labs  10/11/16 0405 10/13/16 0341  WBC 15.2* 15.3*  HGB 9.4* 8.6*  HCT 28.3* 25.4*  PLT 99* 123*    BMET:   Recent Labs  10/12/16 0414 10/13/16 0341  NA 134* 131*  K 3.0* 3.0*  CL 100* 96*  CO2 26 23  GLUCOSE 110* 92  BUN 33* 39*  CREATININE 3.10* 3.41*  CALCIUM 8.0* 7.7*     PT/INR:    Recent Labs  10/13/16 0341  LABPROT 14.7  INR 1.15    CBG (last 3)   Recent Labs  10/12/16 2023 10/12/16 2305 10/13/16 0352  GLUCAP 132* 102* 103*    ABG    Component Value Date/Time   PHART 7.301 (L)  10/10/2016 0354   PCO2ART 48.1 (H) 10/10/2016 0354   PO2ART 104.0 10/10/2016 0354   HCO3 23.8 10/10/2016 0354   TCO2 23 10/10/2016 1537   ACIDBASEDEF 3.0 (H) 10/10/2016 0354   O2SAT 73.2 10/10/2016 0410    CXR: PORTABLE CHEST 1 VIEW  COMPARISON:  Portable chest x-ray of October 12, 2016  FINDINGS: The lungs are reasonably well inflated. The interstitial markings are more conspicuous today. The left hemidiaphragm is now obscured in the right hemidiaphragm partially obscured. There is fluid in the minor fissure. The cardiac silhouette remains enlarged. The pulmonary vascularity is engorged and indistinct. There is calcification in the wall of the thoracic aorta. The sternal wires and left atrial appendage appear intact and in stable position. The right internal jugular Cordis sheath tip projects approximately 3-4 cm above the expected junction with the subclavian vein.  IMPRESSION: Worsening appearance of the chest consistent with CHF with interstitial edema. No pneumothorax.  Thoracic aortic atherosclerosis.   Electronically Signed   By: David  Martinique M.D.   On: 10/13/2016 07:41ORTABLE CHEST 1 VIEW   Assessment/Plan: S/P Procedure(s) (LRB): CORONARY ARTERY BYPASS GRAFTING (CABG) x two, using left internal mammary artery and right leg greater saphenous vein harvested endoscopically (N/A) MAZE (N/A) TRANSESOPHAGEAL ECHOCARDIOGRAM (TEE) (N/A) CLIPPING OF ATRIAL APPENDAGE  Cardiovascular - Currently  in Atrial Fibrillation, rate controlled at 95. Will switch back to IV amiodarone due to no BM, metoprolol dose increase as well. BP 115/59 on 0.2 mcg/min of Milrinone which will be weaned as tolerated tday.   INR - INR this morning is 1.15 after three doses of 2.5 mg Warfarin. Will increase dose to 5mg  this evening   Pulmonary - SpO2 64% on 2L [place], wean O2 as tolerated. CXR this morning shows a worsening appearance of the chest consistent with CHF with interstitial edema.  Encouraged IS and mobilization as tolerable.   Anemia - Expected post-operative. H&H 8.6 and 25.4. Continue ferrous sulfate and monitor.   Fluid Overload - Expected post-operative. Still remains 5kg above pre-operative weight. Increased to 15 mg/hr of Lasix IV this morning. I/O - 1334.3 in the last 25 hours. Following closely due to renal function.   Renal Impairement - sCr elevated to 3.41 this morning. Urine output 1920 in last 24 hours.    Hypokalemia - K+ 3.0 this morning. Supplementation ordered, Will need to closely monitor with renal impairment   CBG - 132/102/103 - Continue ACHS glucose checks and SSI PRN.   Wean Milrinone IV Amiodarone/PO Metoprolol for rate control Mobilize Diuresis Supplement K+ and monitor sCr, Urine output Increase warfarin dose, monitor INR  Edison Simon, PA-S 10/13/2016 8:39 AM   I have seen and examined the patient and agree with the assessment and plan as outlined.  Rexene Alberts, MD 10/13/2016 9:04 AM

## 2016-10-13 NOTE — Care Management Note (Signed)
Case Management Note Original Note Created Zenon Mayo, RN 10/10/2016, 10:56 AM  Patient Details  Name: [name] MRN: [id number] Date of Birth: Jun 07, 1930  Subjective/Objective:  From home with spouse , POD 1 CABG, weaning drips.                 Action/Plan: NCM will follow for dc needs.   Expected Discharge Date:   (TBD)               Expected Discharge Plan:  Skilled Nursing Facility  In-House Referral:  Clinical Social Work  Discharge planning Services  CM Consult  Post Acute Care Choice:    Choice offered to:     DME Arranged:    DME Agency:     HH Arranged:    [place] Agency:     Status of Service:  In process, will continue to follow  If discussed at Long Length of Stay Meetings, dates discussed:    Discharge Disposition:   Additional Comments:  10/13/16- 1000- Marvetta Gibbons RN, CM- pt progressing - plan to wean milrinone today- per PT note recommendation for SNF- will consult CSW for possible placement.   Dahlia Client [place], RN 10/13/2016, 10:02 AM [phone]

## 2016-10-13 NOTE — Progress Notes (Signed)
CT surgery p.m. Rounds  Patient resting but responsive Junctional rhythm with stable blood pressure Urine output 30 cc per hour on Lasix drip with elevated creatinine Continuing IV amiodarone, IV milrinone

## 2016-10-13 NOTE — Progress Notes (Signed)
Physical Therapy Treatment Patient Details Name: [name] MRN: [id number] DOB: 29-Dec-1930 Today's Date: 10/13/2016    History of Present Illness Pt admit for CABGx2 and MAZE procedure.     PT Comments    Pt with increased fatigue today limiting his ability for sustained ambulation. Pt currently, minAx2 for sit<>stand, and modA for steadying ambulation with EVA walker and a close chair follow.Pt required 4 seated rest breaks while walking 150 feet due to 3/4 DoE and possible O2 desaturation. Pt requires skilled PT to continue to progress mobility and to improve strength and endurance to safely navigate in his discharge environment.    Follow Up Recommendations  SNF;Supervision/Assistance - 24 hour     Equipment Recommendations  Other (comment) (TBA)    Recommendations for Other Services       Precautions / Restrictions Precautions Precautions: Fall;Sternal Restrictions Weight Bearing Restrictions: No    Mobility  Bed Mobility               General bed mobility comments: NT in chair on arrival  Transfers Overall transfer level: Needs assistance Equipment used:  (EVA walker) Transfers: Sit to/from Stand Sit to Stand: Min assist;+2 physical assistance         General transfer comment: Pt needed vc for not pulling on armrests to move forward in chair but then requested pillow for sit<>stand with out reminder,   Ambulation/Gait Ambulation/Gait assistance: Mod assist (2nd person for chair follow) Ambulation Distance (Feet): 150 Feet Assistive device:  (EVA walker) Gait Pattern/deviations: Step-through pattern;Decreased stride length;Trunk flexed;Wide base of support;Drifts right/left     General Gait Details: minA for controlling Eva walker and vc for maintaining upright and slowing gait. Pt fatigues easily and required 4x seated rest breaks due to 3/4 DoE and possible desaturation (poor wave form on poratable monitor).       Balance Overall balance  assessment: Needs assistance;History of Falls         Standing balance support: Bilateral upper extremity supported;During functional activity Standing balance-Leahy Scale: Poor Standing balance comment: relies on UE support.                             Cognition Arousal/Alertness: Awake/alert Behavior During Therapy: WFL for tasks assessed/performed Overall Cognitive Status: Within Functional Limits for tasks assessed                                           General Comments General comments (skin integrity, edema, etc.): Pt on 2L O2 via nasal cannula, prior to ambualtion SaO2 98%O2, BP 106/63, HR 88bpm, RR 15, with ambulation SaO2 dropped to 70%O2 with very poor wave form, vc for pursed lipped breathing and when waveform returned 95%O2, HR max 123bpm, after ambulation SaO2 95%O2, BP 109/56, HR 91bpm, RR 23        Pertinent Vitals/Pain Pain Assessment: Faces Faces Pain Scale: Hurts little more Pain Location: chest incision Pain Descriptors / Indicators: Discomfort Pain Intervention(s): Monitored during session;Limited activity within patient's tolerance           PT Goals (current goals can now be found in the care plan section) Acute Rehab PT Goals Patient Stated Goal: to get better PT Goal Formulation: With patient Time For Goal Achievement: 10/25/16 Potential to Achieve Goals: Good Progress towards PT goals: Progressing toward goals    Frequency  Min 3X/week      PT Plan Current plan remains appropriate       AM-PAC PT "6 Clicks" Daily Activity  Outcome Measure  Difficulty turning over in bed (including adjusting bedclothes, sheets and blankets)?: Unable Difficulty moving from lying on back to sitting on the side of the bed? : Unable Difficulty sitting down on and standing up from a chair with arms (e.g., wheelchair, bedside commode, etc,.)?: A Lot Help needed moving to and from a bed to chair (including a wheelchair)?: A  Lot Help needed walking in hospital room?: A Lot Help needed climbing 3-5 steps with a railing? : Total 6 Click Score: 9    End of Session Equipment Utilized During Treatment: Gait belt;Oxygen Activity Tolerance: Patient limited by fatigue;Patient limited by pain Patient left: in chair;with call bell/phone within reach;with chair alarm set;with family/visitor present Nurse Communication: Mobility status PT Visit Diagnosis: Unsteadiness on feet (R26.81);Muscle weakness (generalized) (M62.81);Pain Pain - part of body:  (chest)     Time: 2637-8588 PT Time Calculation (min) (ACUTE ONLY): 32 min  Charges:  $Gait Training: 8-22 mins $Therapeutic Activity: 8-22 mins                    G Codes:       Tykiera Raven B. Migdalia Dk PT, DPT Acute Rehabilitation  [phone] Pager [phone]     [place] 10/13/2016, 1:07 PM

## 2016-10-14 ENCOUNTER — Inpatient Hospital Stay (HOSPITAL_COMMUNITY): Payer: Medicare Other

## 2016-10-14 LAB — COMPREHENSIVE METABOLIC PANEL
ALBUMIN: 2.4 g/dL — AB (ref 3.5–5.0)
ALT: 18 U/L (ref 17–63)
ANION GAP: 10 (ref 5–15)
AST: 22 U/L (ref 15–41)
Alkaline Phosphatase: 75 U/L (ref 38–126)
BUN: 50 mg/dL — ABNORMAL HIGH (ref 6–20)
CHLORIDE: 94 mmol/L — AB (ref 101–111)
CO2: 23 mmol/L (ref 22–32)
Calcium: 7.5 mg/dL — ABNORMAL LOW (ref 8.9–10.3)
Creatinine, Ser: 3.98 mg/dL — ABNORMAL HIGH (ref 0.61–1.24)
GFR calc Af Amer: 15 mL/min — ABNORMAL LOW (ref >60)
GFR calc non Af Amer: 13 mL/min — ABNORMAL LOW (ref >60)
GLUCOSE: 105 mg/dL — AB (ref 65–99)
POTASSIUM: 3.9 mmol/L (ref 3.5–5.1)
Sodium: 127 mmol/L — ABNORMAL LOW (ref 135–145)
TOTAL PROTEIN: 4.9 g/dL — AB (ref 6.5–8.1)
Total Bilirubin: 0.9 mg/dL (ref 0.3–1.2)

## 2016-10-14 LAB — GLUCOSE, CAPILLARY
GLUCOSE-CAPILLARY: 134 mg/dL — AB (ref 65–99)
GLUCOSE-CAPILLARY: 118 mg/dL — AB (ref 65–99)
GLUCOSE-CAPILLARY: 176 mg/dL — AB (ref 65–99)
GLUCOSE-CAPILLARY: 179 mg/dL — AB (ref 65–99)
Glucose-Capillary: 102 mg/dL — ABNORMAL HIGH (ref 65–99)
Glucose-Capillary: 111 mg/dL — ABNORMAL HIGH (ref 65–99)
Glucose-Capillary: 149 mg/dL — ABNORMAL HIGH (ref 65–99)

## 2016-10-14 LAB — BLOOD GAS, ARTERIAL
Acid-base deficit: 3.7 mmol/L — ABNORMAL HIGH (ref 0.0–2.0)
Bicarbonate: 22.1 mmol/L (ref 20.0–28.0)
Drawn by: 270271
O2 Content: 2 L/min
O2 Saturation: 96 %
Patient temperature: 98.6
pCO2 arterial: 49.2 mmHg — ABNORMAL HIGH (ref 32.0–48.0)
pH, Arterial: 7.275 — ABNORMAL LOW (ref 7.350–7.450)
pO2, Arterial: 83.7 mmHg (ref 83.0–108.0)

## 2016-10-14 LAB — PROTIME-INR
INR: 1.2
Prothrombin Time: 15.1 seconds (ref 11.4–15.2)

## 2016-10-14 LAB — CBC
HCT: 26.2 % — ABNORMAL LOW (ref 39.0–52.0)
Hemoglobin: 8.5 g/dL — ABNORMAL LOW (ref 13.0–17.0)
MCH: 28.8 pg (ref 26.0–34.0)
MCHC: 32.4 g/dL (ref 30.0–36.0)
MCV: 88.8 fL (ref 78.0–100.0)
PLATELETS: 143 10*3/uL — AB (ref 150–400)
RBC: 2.95 MIL/uL — AB (ref 4.22–5.81)
RDW: 14.6 % (ref 11.5–15.5)
WBC: 16.6 10*3/uL — ABNORMAL HIGH (ref 4.0–10.5)

## 2016-10-14 LAB — ECHOCARDIOGRAM COMPLETE
Height: 68 in
WEIGHTICAEL: 3693.15 [oz_av]

## 2016-10-14 LAB — COOXEMETRY PANEL
CARBOXYHEMOGLOBIN: 1 % (ref 0.5–1.5)
METHEMOGLOBIN: 1.3 % (ref 0.0–1.5)
O2 Saturation: 75.6 %
Total hemoglobin: 8.7 g/dL — ABNORMAL LOW (ref 12.0–16.0)

## 2016-10-14 LAB — PREALBUMIN: Prealbumin: 8.7 mg/dL — ABNORMAL LOW (ref 18–38)

## 2016-10-14 MED ORDER — DOPAMINE-DEXTROSE 3.2-5 MG/ML-% IV SOLN
3.0000 ug/kg/min | INTRAVENOUS | Status: DC
  Administered 2016-10-14 – 2016-10-16 (×2): 3 ug/kg/min via INTRAVENOUS
  Filled 2016-10-14 (×2): qty 250

## 2016-10-14 MED ORDER — PERFLUTREN LIPID MICROSPHERE
1.0000 mL | INTRAVENOUS | Status: AC | PRN
  Filled 2016-10-14: qty 10

## 2016-10-14 MED ORDER — PERFLUTREN LIPID MICROSPHERE: 1.0000 mL | INTRAVENOUS | Status: DC | PRN

## 2016-10-14 MED ORDER — DARBEPOETIN ALFA 150 MCG/0.3ML IJ SOSY
150.0000 ug | PREFILLED_SYRINGE | Freq: Once | INTRAMUSCULAR | Status: AC
  Administered 2016-10-15: 150 ug via SUBCUTANEOUS
  Filled 2016-10-14 (×2): qty 0.3

## 2016-10-14 MED ORDER — FUROSEMIDE 10 MG/ML IJ SOLN
160.0000 mg | Freq: Once | INTRAVENOUS | Status: AC
  Administered 2016-10-14: 160 mg via INTRAVENOUS
  Filled 2016-10-14: qty 16

## 2016-10-14 MED ORDER — PHENYLEPHRINE HCL 10 MG/ML IJ SOLN
0.0000 ug/min | INTRAMUSCULAR | Status: DC
  Administered 2016-10-15 (×3): 50 ug/min via INTRAVENOUS
  Filled 2016-10-14 (×4): qty 1

## 2016-10-14 MED ORDER — PERFLUTREN LIPID MICROSPHERE
INTRAVENOUS | Status: AC
  Administered 2016-10-14: 2 mL
  Filled 2016-10-14: qty 10

## 2016-10-14 NOTE — Progress Notes (Signed)
  Echocardiogram 2D Echocardiogram with Definity has been performed.  Tresa Res 10/14/2016, 9:24 PM

## 2016-10-14 NOTE — Social Work (Signed)
CSW met with daughter and patient at bedside and discussed SNF's. CSW continuing to follow up for SNF placement. Daughter indicated [facility] or [facility].  CSW will continue to follow up.Elissa Hefty, LCSW Clinical Social Worker [phone]

## 2016-10-14 NOTE — Progress Notes (Signed)
      [address]       [place] [address]             [phone]      POD # 5 CABG maze  C/o nausea earlier  BP (!) 106/59 (BP Location: Left Arm)   Pulse 83   Temp 97.7 F (36.5 C) (Oral)   Resp (!) 9   Ht 5\' 8"  (1.727 m)   Wt 230 lb 13.2 oz (104.7 kg)   SpO2 92%   BMI 35.10 kg/m    Intake/Output Summary (Last 24 hours) at 10/14/16 1907 Last data filed at 10/14/16 1600  Gross per 24 hour  Intake          1438.75 ml  Output              610 ml  Net           828.75 ml   UOP still marginal  Abdomen distended but nontender  Remo Lipps C. Roxan Hockey, MD [facility] [phone]

## 2016-10-14 NOTE — Progress Notes (Signed)
[address]       [place] [address]             [phone]        CARDIOTHORACIC SURGERY PROGRESS NOTE   R5 Days Post-Op Procedure(s) (LRB): CORONARY ARTERY BYPASS GRAFTING (CABG) x two, using left internal mammary artery and right leg greater saphenous vein harvested endoscopically (N/A) MAZE (N/A) TRANSESOPHAGEAL ECHOCARDIOGRAM (TEE) (N/A) CLIPPING OF ATRIAL APPENDAGE  Subjective: Alert and awake this morning. Still notes increasing weakness, SOB, and peripheral edema. No complaints of CP.   Objective: Vital signs: BP Readings from Last 1 Encounters:  10/14/16 (!) 110/59   Pulse Readings from Last 1 Encounters:  10/14/16 86   Resp Readings from Last 1 Encounters:  10/14/16 (!) 9   Temp Readings from Last 1 Encounters:  10/14/16 97.7 F (36.5 C) (Oral)    Hemodynamics:    Physical Exam:  Rhythm:   Junctional at 88 bpm  Breath sounds: Crackles in the bases bilaterally  Heart sounds:  RRR, no m/r/g  Incisions:  Median sternotomy incision is clean, dry, intact without erythema or drainage  Abdomen:  Soft, non-tender, positive BS all 4 quadrants  Extremities:  RUE and B/L LE are edematous   Intake/Output from previous day: 10/01 0701 - 10/02 0700 In: 1750.7 [I.V.:1550.7; IV Piggyback:200] Out: 730 [Urine:730] Intake/Output this shift: No intake/output data recorded.  Lab Results:  CBC: Recent Labs  10/13/16 0341 10/14/16 0520  WBC 15.3* 16.6*  HGB 8.6* 8.5*  HCT 25.4* 26.2*  PLT 123* 143*    BMET:  Recent Labs  10/13/16 0341 10/14/16 0520  NA 131* 127*  K 3.0* 3.9  CL 96* 94*  CO2 23 23  GLUCOSE 92 105*  BUN 39* 50*  CREATININE 3.41* 3.98*  CALCIUM 7.7* 7.5*     PT/INR:   Recent Labs  10/14/16 0520  LABPROT 15.1  INR 1.20    CBG (last 3)   Recent Labs  10/13/16 2304 10/14/16 0114 10/14/16 0308  GLUCAP 60* 134* 102*    ABG    Component Value Date/Time   PHART 7.275 (L) 10/14/2016 0604   PCO2ART 49.2 (H) 10/14/2016 0604   PO2ART 83.7 10/14/2016 0604   HCO3 22.1 10/14/2016 0604   TCO2 23 10/10/2016 1537   ACIDBASEDEF 3.7 (H) 10/14/2016 0604   O2SAT 96.0 10/14/2016 0604    CXR: PORTABLE CHEST 1 VIEW  COMPARISON:  10/13/2016.  FINDINGS: Cardiomegaly, with sequelae of previous CABG. Worsening aeration, with pulmonary edema and bibasilar atelectasis as well as effusions. No pneumothorax. Consolidation cannot be excluded in the dense retrocardiac region. Swan-Ganz introducer unchanged.  IMPRESSION: Worsening aeration.  Increasing pulmonary edema.  Assessment/Plan: S/P Procedure(s) (LRB): CORONARY ARTERY BYPASS GRAFTING (CABG) x two, using left internal mammary artery and right leg greater saphenous vein harvested endoscopically (N/A) MAZE (N/A) TRANSESOPHAGEAL ECHOCARDIOGRAM (TEE) (N/A) CLIPPING OF ATRIAL APPENDAGE  Cardiovascular - Currently in Junctional Rhythm at 88 bpm on 30 mg/hr Amiodarone and 25 mg Metoprolol. BP 119/67 on 0.2 mcg/min of Milrinone which will be weaned as tolerated tday.   INR - INR this morning is 1.20. Warfarin dose increased to 5 mg yesterday, will continue to monitor.   Pulmonary - SpO2 97% on 4L [place], wean O2 as tolerated. Still has bilateral crackles on examination. CXR this morning shows a worsening aeration and increasing pulmonary edema. Encouraged IS and mobilization as tolerable.   Anemia - Expected post-operative. H&H 8.5 and 26.2. Continue ferrous sulfate  and monitor.   Fluid Overload - Expected post-operative. Still remains 5kg above pre-operative weight. I/O +1020.7 in the last 24 hours on 15 mg/hr Lasix drip, concern that this is no longer sufficient.   Renal Impairement - sCr elevated to 3.98 this morning. Urine output slowed to 730 in last 24 hours. Will add Dopamine to help increase renal perfusion. 160 mg Lasix push will be given. Will consult nephrology   Hypokalemia - K+ 3.9 this morning. Supplementation ordered,  Will need to closely monitor with renal impairment   CBG - 60/134/102 - Continue ACHS glucose checks and SSI PRN.   Stop Milrinone, Add Dopamine 33mcg IV Amiodarone/PO Metoprolol for rate control With worsening edema and renal function, Dopamine added to help kidney perfusion.  Will consult Nephrology  Mobilize as tolerated Supplement K+ and monitor sCr, Urine output Monitor INR  Edison Simon, PA-S 10/14/2016 7:39 AM   I have seen and examined the patient and agree with the assessment and plan as outlined.  [name] is more short of breath and weak today.  Rhythm and hemodynamics stable, but renal function has not improved.  He remains oliguric on lasix drip.  Will restart low dose dopamine, try 1 bolus of lasix and ask the Nephrology team to see in consult.  Hopefully UOP will increase but may have to consider RRT if renal function doesn't improve soon.  Rexene Alberts, MD 10/14/2016 11:00 AM

## 2016-10-14 NOTE — NC FL2 (Signed)
[place] LEVEL OF CARE SCREENING TOOL     IDENTIFICATION  Patient Name: [name] Birthdate: 07-01-1930 Sex: male Admission Date (Current Location): 10/09/2016  [facility] and [place] Number:  [employer] and Address:  The [place]. [facility], [place] [address] [place], [place] [address]      Provider Number: [id number]  Attending Physician Name and Address:  Rexene Alberts, MD  Relative Name and Phone Number:  Mike Hamre, [phone], spouse    Current Level of Care: Hospital Recommended Level of Care: [place] Prior Approval Number:    Date Approved/Denied: 10/14/16 PASRR Number: [id number]  Discharge Plan: SNF    Current Diagnoses: Patient Active Problem List   Diagnosis Date Noted  . S/P CABG x 2 + maze procedure 10/09/2016  . S/P Maze operation for atrial fibrillation 10/09/2016  . Coronary artery disease involving native coronary artery of native heart with angina pectoris ([place]) 09/30/2016  . Fusarium infection ([place]) 01/10/2016  . Dyspnea on exertion 09/12/2015  . Sepsis due to undetermined organism ([place])   . Chronic systolic CHF (congestive heart failure) ([place])   . Chronic kidney disease (CKD), stage IV (severe) ([place])   . Cellulitis of left lower extremity   . Pulmonary hypertension ([place])   . Paroxysmal atrial fibrillation (HCC)   . Other emphysema ([place])   . Bronchiectasis without complication ([place])   . Other specified hypothyroidism   . Fever 08/18/2014  . Sepsis ([place]) 08/18/2014  . CKD (chronic kidney disease) stage 4, GFR 15-29 ml/min (HCC) 08/18/2014  . Acute on chronic systolic heart failure ([place]) 08/18/2014  . Bronchiectasis ([place]) 08/18/2014  . Hypothyroidism 08/18/2014  . Bronchiectasis without acute exacerbation ([place]) 12/29/2013  . LVH (left ventricular hypertrophy) due to hypertensive disease 12/10/2013  . Atrial fibrillation ([place]) 12/08/2013  . CHF (congestive heart failure) ([place]) 12/07/2013   . Chronic obstructive airway disease with asthma ([place]) 12/07/2013  . Thyroid goiter 02/01/2013  . Low TSH level 05/18/2012  . Right thyroid nodule 05/18/2012  . Diabetes ([place]) 05/18/2012  . HLD (hyperlipidemia) 05/18/2012  . Rheumatoid arthritis ([place]) 05/18/2012  . Essential hypertension 05/18/2012  . GERD (gastroesophageal reflux disease) 05/18/2012  . Anemia 05/18/2012  . Knee pain 02/12/2012    Orientation RESPIRATION BLADDER Height & Weight     Time, Situation, Place, Self  Normal External catheter Weight:  (unable to get accurate weight at thsi time, md aware) Height:  5\' 8"  (172.7 cm)  BEHAVIORAL SYMPTOMS/MOOD NEUROLOGICAL BOWEL NUTRITION STATUS      Continent Diet  AMBULATORY STATUS COMMUNICATION OF NEEDS Skin   Extensive Assist Verbally Surgical wounds                       Personal Care Assistance Level of Assistance  Bathing, Dressing, Feeding Bathing Assistance: Limited assistance Feeding assistance: Limited assistance Dressing Assistance: Limited assistance     Functional Limitations Info             SPECIAL CARE FACTORS FREQUENCY  PT (By licensed PT)     PT Frequency: 3x week              Contractures Contractures Info: Not present    Additional Factors Info  Code Status, Allergies, Insulin Sliding Scale Code Status Info: Full code Allergies Info: No Known Allergies   Insulin Sliding Scale Info: Insulin Daily       Current Medications (10/14/2016):  This is the current hospital active medication  list Current Facility-Administered Medications  Medication Dose Route Frequency Provider Last Rate Last Dose  . 0.9 %  sodium chloride infusion  250 mL Intravenous Continuous Rexene Alberts, MD      . 0.9 %  sodium chloride infusion  250 mL Intravenous PRN Rexene Alberts, MD      . acetaminophen (TYLENOL) tablet 1,000 mg  1,000 mg Oral Q6H Rexene Alberts, MD   1,000 mg at 10/14/16 0451  . amiodarone (NEXTERONE PREMIX) 360-4.14  MG/200ML-% (1.8 mg/mL) IV infusion  30 mg/hr Intravenous Continuous Rexene Alberts, MD 16.7 mL/hr at 10/14/16 0900 30 mg/hr at 10/14/16 0900  . aspirin EC tablet 325 mg  325 mg Oral Daily Rexene Alberts, MD   325 mg at 10/14/16 1025  . atorvastatin (LIPITOR) tablet 40 mg  40 mg Oral q1800 Rexene Alberts, MD   40 mg at 10/13/16 1818  . bisacodyl (DULCOLAX) EC tablet 10 mg  10 mg Oral Daily Rexene Alberts, MD   10 mg at 10/14/16 1025   Or  . bisacodyl (DULCOLAX) suppository 10 mg  10 mg Rectal Daily Rexene Alberts, MD   10 mg at 10/13/16 0925  . docusate sodium (COLACE) capsule 200 mg  200 mg Oral Daily Rexene Alberts, MD   200 mg at 10/14/16 1025  . DOPamine (INTROPIN) 800 mg in dextrose 5 % 250 mL (3.2 mg/mL) infusion  3 mcg/kg/min Intravenous Titrated Rexene Alberts, MD      . enoxaparin (LOVENOX) injection 30 mg  30 mg Subcutaneous QHS Rexene Alberts, MD   30 mg at 10/13/16 2347  . ferrous sulfate tablet 325 mg  325 mg Oral Q breakfast Rexene Alberts, MD   325 mg at 10/14/16 1032  . finasteride (PROSCAR) tablet 5 mg  5 mg Oral QHS Rexene Alberts, MD   5 mg at 10/13/16 2122  . folic acid (FOLVITE) tablet 1 mg  1 mg Oral Daily Rexene Alberts, MD   1 mg at 10/14/16 1025  . furosemide (LASIX) 160 mg in dextrose 5 % 50 mL IVPB  160 mg Intravenous Once Rexene Alberts, MD      . furosemide (LASIX) 250 mg in dextrose 5 % 250 mL (1 mg/mL) infusion  15 mg/hr Intravenous Continuous Rexene Alberts, MD 15 mL/hr at 10/14/16 0900 15 mg/hr at 10/14/16 0900  . gabapentin (NEURONTIN) capsule 300 mg  300 mg Oral BID Rexene Alberts, MD   300 mg at 10/14/16 1025  . guaiFENesin (MUCINEX) 12 hr tablet 1,200 mg  1,200 mg Oral BID Rexene Alberts, MD   1,200 mg at 10/14/16 1025  . insulin aspart (novoLOG) injection 0-24 Units  0-24 Units Subcutaneous Q4H Rexene Alberts, MD   8 Units at 10/13/16 1630  . insulin detemir (LEVEMIR) injection 20 Units  20 Units Subcutaneous Daily Rexene Alberts, MD   20 Units at 10/14/16 1026  . lactated ringers infusion   Intravenous Continuous Rexene Alberts, MD      . lactated ringers infusion   Intravenous Continuous Rexene Alberts, MD   Stopped at 10/09/16 1708  . lactated ringers infusion   Intravenous Continuous Rexene Alberts, MD 20 mL/hr at 10/14/16 0900    . levothyroxine (SYNTHROID, LEVOTHROID) tablet 25 mcg  25 mcg Oral QAC breakfast Rexene Alberts, MD   25 mcg at 10/14/16 1024  . metoprolol tartrate (LOPRESSOR) injection 2.5-5 mg  2.5-5  mg Intravenous Q2H PRN Rexene Alberts, MD   2.5 mg at 10/13/16 0458  . metoprolol tartrate (LOPRESSOR) tablet 25 mg  25 mg Oral BID Rexene Alberts, MD   25 mg at 10/14/16 1032  . morphine 4 MG/ML injection 1-2 mg  1-2 mg Intravenous Q1H PRN Rexene Alberts, MD   2 mg at 10/12/16 2113  . moving right along book   Does not apply Once Rexene Alberts, MD      . ondansetron [facility]) injection 4 mg  4 mg Intravenous Q6H PRN Rexene Alberts, MD      . oxyCODONE (Oxy IR/ROXICODONE) immediate release tablet 5-10 mg  5-10 mg Oral Q3H PRN Rexene Alberts, MD   5 mg at 10/13/16 2122  . pantoprazole (PROTONIX) EC tablet 40 mg  40 mg Oral Daily Rexene Alberts, MD   40 mg at 10/14/16 1025  . patient's guide to using coumadin book   Does not apply Once Lyndee Leo, [facility]      . sodium chloride flush (NS) 0.9 % injection 10-40 mL  10-40 mL Intracatheter Q12H Rexene Alberts, MD   10 mL at 10/11/16 1100  . sodium chloride flush (NS) 0.9 % injection 10-40 mL  10-40 mL Intracatheter PRN Rexene Alberts, MD      . sodium chloride flush (NS) 0.9 % injection 3 mL  3 mL Intravenous Q12H Rexene Alberts, MD   3 mL at 10/13/16 1100  . sodium chloride flush (NS) 0.9 % injection 3 mL  3 mL Intravenous PRN Rexene Alberts, MD   3 mL at 10/13/16 1100  . sodium chloride flush (NS) 0.9 % injection 3 mL  3 mL Intravenous Q12H Rexene Alberts, MD   3 mL at 10/13/16 1100  . sodium chloride flush (NS) 0.9 % injection  3 mL  3 mL Intravenous PRN Rexene Alberts, MD   3 mL at 10/13/16 1100  . tamsulosin (FLOMAX) capsule 0.4 mg  0.4 mg Oral QHS Rexene Alberts, MD   0.4 mg at 10/13/16 2348  . traMADol (ULTRAM) tablet 50-100 mg  50-100 mg Oral Q4H PRN Rexene Alberts, MD   50 mg at 10/13/16 2951  . traZODone (DESYREL) tablet 50 mg  50 mg Oral QHS PRN Rexene Alberts, MD   50 mg at 10/13/16 2122  . warfarin (COUMADIN) tablet 5 mg  5 mg Oral q1800 Rexene Alberts, MD      . Warfarin - Physician Dosing Inpatient   Does not apply q1800 Rexene Alberts, MD         Discharge Medications: Please see discharge summary for a list of discharge medications.  Relevant Imaging Results:  Relevant Lab Results:   Additional Information SS#:[id number] [place], LCSW

## 2016-10-14 NOTE — Consult Note (Signed)
[facility] Consultation Note Requesting Physician:  Dr. Roxy Manns Primary Nephrologist: Dr. Marval Regal Reason for Consult:  AKI on CKD  HPI: The patient is a 83 y.o. year-old with PMH DM, HTN, HLD, chronic dCHF, COPD, PAF, RA, secondary HPT. Known CKD3/4 (DM, HTN, prior NSAID use; followed by Dr. Marval Regal). Last creatinine in our office 2.61 08/28/16. Had heart cath 10/07/16 showing 90% LM ds. Had 2V CABG, MAZE procedure, clipping of atrial appendage by dr. Roxy Manns on 10/09/16. Has had insidious worsening of renal function since that time with creatinine today up to 3.98. UOP has trailed off past 48 hours from 1.5-2L range, 730 cc yesterday, only 255 cc today. On lasix drip 9/20-10/2 at 15 mg/hour. Weight about 5-6 kg over pre op weight with CXR showing worsening pulmonary edema today. Blood pressures low side with many systolics in 22-02 range. We are asked to see.   Creatinine summary Date/Time Value  10/14/2016 05:20 AM 3.98   10/13/2016 03:41 AM 3.41   10/12/2016 04:14 AM 3.10   10/11/2016 04:05 AM 2.63  10/10/2016 03:59 PM 2.49   10/10/2016 03:37 PM 2.40   10/10/2016 03:49 AM 2.37   10/09/2016 09:01 PM 2.20   10/09/2016 08:54 PM 2.33   10/09/2016 01:39 PM 2.10   10/09/2016 11:09 AM 2.30   10/09/2016 10:14 AM 2.00   10/09/2016 09:38 AM 2.40   10/09/2016 08:27 AM 2.30   10/06/2016 01:52 PM 2.50   10/01/2016 06:53 AM 2.24  12/02/2015 01:02 AM 2.07   12/01/2015 07:56 AM 1.75   11/08/2015 12:57 PM 2.20     Past Medical History:  Diagnosis Date  . Anemia   . Asthma   . Atrial fibrillation ([place]) 12/08/2013  . CHF (congestive heart failure) ([place])   . Chronic joint pain    "from RA" (10/01/2016)  . CKD (chronic kidney disease), stage III   . Constipation    takes stool softener daily  . COPD (chronic obstructive pulmonary disease) ([place])   . Coronary artery disease   . Coronary artery disease involving native coronary artery of native heart with angina pectoris ([place]) 09/30/2016  . Dysrhythmia     a-fib  . GERD (gastroesophageal reflux disease)    takes Zantac daily   . History of blood transfusion    "related to one of my ORs"  . Hyperlipidemia    takes Pravastatin daily  . Hypertension    takes Cardizem,Proscar,and Lisinopril daily  . Hypothyroidism   . Impaired hearing    wears hearing aids  . Joint swelling    "from RA" (10/01/2016)  . Peripheral neuropathy   . Pneumonia 2017  . Rheumatoid arthritis ([place])    "all over; take orencia  q 4 wks" (10/01/2016)  . S/P CABG x 2 10/09/2016   LIMA to LAD, SVG to OM, EVH via right thigh  . S/P Maze operation for atrial fibrillation 10/09/2016   Complete bilateral atrial lesion set using bipolar radiofrequency and cryothermy ablation with clipping of LA appendage  . Type II diabetes mellitus ([place])      Past Surgical History:  Procedure Laterality Date  . AMPUTATION TOE Left 11/08/2015   Procedure: LEFT HALLUX AMPUTATION AT THE METATARSOPHALANGEAL JOINT;  Surgeon: Wylene Simmer, MD;  Location: [facility];  Service: Orthopedics;  Laterality: Left;  . ANKLE FUSION Left 2014  . BIOPSY THYROID  2014  . CARDIAC CATHETERIZATION  09/30/2016  . CARPAL TUNNEL RELEASE Bilateral 1990's  . CATARACT EXTRACTION W/ INTRAOCULAR LENS  IMPLANT, BILATERAL Bilateral  1990's  . CLIPPING OF ATRIAL APPENDAGE  10/09/2016   Procedure: CLIPPING OF ATRIAL APPENDAGE;  Surgeon: Rexene Alberts, MD;  Location: [facility];  Service: Open Heart Surgery;;  . COLONOSCOPY    . CORONARY ARTERY BYPASS GRAFT N/A 10/09/2016   Procedure: CORONARY ARTERY BYPASS GRAFTING (CABG) x two, using left internal mammary artery and right leg greater saphenous vein harvested endoscopically;  Surgeon: Rexene Alberts, MD;  Location: [facility];  Service: Open Heart Surgery;  Laterality: N/A;  . FOREIGN BODY REMOVAL Right 09/06/2013   Procedure: REMOVAL FOREIGN BODY RIGHT INDEX FINGER;  Surgeon: Daryll Brod, MD;  Location: [facility] [facility];  Service: Orthopedics;   Laterality: Right;  ANESTHESIA: IV REGIONAL FAB  . JOINT REPLACEMENT    . MAZE N/A 10/09/2016   Procedure: MAZE;  Surgeon: Rexene Alberts, MD;  Location: [facility];  Service: Open Heart Surgery;  Laterality: N/A;  . REPAIR TENDONS FOOT Left 1946; 1984   "farming accident; repaired tendons both times"  . RIGHT/LEFT HEART CATH AND CORONARY ANGIOGRAPHY N/A 09/30/2016   Procedure: RIGHT/LEFT HEART CATH AND CORONARY ANGIOGRAPHY;  Surgeon: Nigel Mormon, MD;  Location: [facility];  Service: Cardiovascular;  Laterality: N/A;  . SHOULDER ARTHROSCOPY W/ ROTATOR CUFF REPAIR Right 1990's  . TEE WITHOUT CARDIOVERSION N/A 10/09/2016   Procedure: TRANSESOPHAGEAL ECHOCARDIOGRAM (TEE);  Surgeon: Rexene Alberts, MD;  Location: [facility];  Service: Open Heart Surgery;  Laterality: N/A;  . THYROIDECTOMY Right 02/01/2013   Procedure: RIGHT THYROIDECTOMY LOBECTOMY;  Surgeon: Earnstine Regal, MD;  Location: [facility];  Service: General;  Laterality: Right;  . TONSILLECTOMY  1930's  . TOTAL KNEE ARTHROPLASTY Right 12/16/2010   Procedure: TOTAL KNEE ARTHROPLASTY; rt Surgeon: Rudean Haskell, MD;  Location: [facility];  Service: Orthopedics;  Laterality: Right;  . TOTAL SHOULDER REPLACEMENT Left 03/2010     Family History  Problem Relation Age of Onset  . Cancer Mother        breast  . Aneurysm Father    Social History:  reports that he quit smoking about 51 years ago. His smoking use included Cigarettes. He has a 15.00 pack-year smoking history. He has never used smokeless tobacco. He reports that he does not drink alcohol or use drugs.  Allergies: No Known Allergies  Home medications: Prior to Admission medications   Medication Sig Start Date End Date Taking? Authorizing Provider  Abatacept (ORENCIA IV) Inject into the vein every 30 (thirty) days. Infusion due again October 2018   Yes [provider]  albuterol (PROVENTIL) (2.5 MG/3ML) 0.083% nebulizer solution Take 3 mLs (2.5 mg total) by nebulization  every 6 (six) hours as needed for wheezing or shortness of breath. 07/01/16  Yes Juanito Doom, MD  aspirin 81 MG chewable tablet Chew 1 tablet (81 mg total) by mouth daily. 10/01/16  Yes Adrian Prows, MD  atorvastatin (LIPITOR) 40 MG tablet Take 40 mg by mouth daily.   Yes [provider]  ferrous sulfate 325 (65 FE) MG tablet Take 325 mg by mouth daily with breakfast.    Yes [provider]  finasteride (PROSCAR) 5 MG tablet Take 5 mg by mouth at bedtime.    Yes [provider]  fluticasone furoate-vilanterol (BREO ELLIPTA) 100-25 MCG/INH AEPB Inhale 1 puff into the lungs daily. 01/01/16  Yes Parrett, Tammy S, NP  folic acid (FOLVITE) 1 MG tablet Take 1 mg by mouth daily.     Yes [provider]  furosemide (  LASIX) 80 MG tablet Take 80 mg by mouth 2 (two) times daily.   Yes [provider]  gabapentin (NEURONTIN) 300 MG capsule Take 1 capsule (300 mg total) by mouth 2 (two) times daily. 12/13/13  Yes Milagros Loll, MD  guaiFENesin (MUCINEX) 600 MG 12 hr tablet Take 1,200 mg by mouth 2 (two) times daily.    Yes [provider]  HYDROcodone-acetaminophen (NORCO) 5-325 MG per tablet Take 1 tablet by mouth every 6 (six) hours as needed for moderate pain. Patient taking differently: Take 1 tablet by mouth every 6 (six) hours as needed for moderate pain. [facility] 09[facility]06[facility]13  Yes Daryll Brod, MD  Insulin Glargine (TOUJEO SOLOSTAR) 300 UNIT/ML SOPN Inject 14 Units into the skin at bedtime.    Yes [provider]  isosorbide-hydrALAZINE (BIDIL) 20-37.5 MG tablet Take 1 tablet by mouth 3 (three) times daily.   Yes [provider]  leflunomide (ARAVA) 20 MG tablet Take 20 mg by mouth daily.  05/07/16  Yes [provider]  levothyroxine (SYNTHROID, LEVOTHROID) 25 MCG tablet Take 25 mcg by mouth daily before breakfast.   Yes [provider]  metoprolol (LOPRESSOR) 50 MG tablet Take 25 mg by mouth 2 (two)  times daily.    Yes [provider]  ranitidine (ZANTAC) 150 MG tablet Take 150 mg by mouth 2 (two) times daily.     Yes [provider]  Tamsulosin HCl (FLOMAX) 0.4 MG CAPS Take 0.4 mg by mouth at bedtime.    Yes [provider]  traZODone (DESYREL) 50 MG tablet Take 50 mg by mouth at bedtime as needed for sleep.   Yes [provider]    Inpatient medications: . acetaminophen  1,000 mg Oral Q6H  . aspirin EC  325 mg Oral Daily  . atorvastatin  40 mg Oral q1800  . bisacodyl  10 mg Oral Daily   Or  . bisacodyl  10 mg Rectal Daily  . docusate sodium  200 mg Oral Daily  . enoxaparin (LOVENOX) injection  30 mg Subcutaneous QHS  . ferrous sulfate  325 mg Oral Q breakfast  . finasteride  5 mg Oral QHS  . folic acid  1 mg Oral Daily  . gabapentin  300 mg Oral BID  . guaiFENesin  1,200 mg Oral BID  . insulin aspart  0-24 Units Subcutaneous Q4H  . insulin detemir  20 Units Subcutaneous Daily  . levothyroxine  25 mcg Oral QAC breakfast  . metoprolol tartrate  25 mg Oral BID  . moving right along book   Does not apply Once  . pantoprazole  40 mg Oral Daily  . patient's guide to using coumadin book   Does not apply Once  . sodium chloride flush  10-40 mL Intracatheter Q12H  . sodium chloride flush  3 mL Intravenous Q12H  . sodium chloride flush  3 mL Intravenous Q12H  . tamsulosin  0.4 mg Oral QHS  . warfarin  5 mg Oral q1800  . Warfarin - Physician Dosing Inpatient   Does not apply q1800   . sodium chloride    . sodium chloride    . amiodarone 30 mg/hr (10/14/16 1100)  . DOPamine 3 mcg/kg/min (10/14/16 1204)  . furosemide (LASIX) infusion 15 mg/hr (10/14/16 1300)  . lactated ringers    . lactated ringers Stopped (10/09/16 1708)  . lactated ringers 20 mL/hr at 10/14/16 1100    Review of Systems  + Weak + SOB with  minimal exertion + sternal pain from surgery + abdominal distension more so than usual Not presently nauseous but + episode vomiting  earlier today  Physical Exam:  Blood pressure (!) 94/49, pulse 87, temperature 97.7 F (36.5 C), temperature source Oral, resp. rate (!) 7, height 5\' 8"  (1.727 m), weight 104.7 kg (230 lb 13.2 oz), SpO2 93 %.  Gen: Obese pale appearing WM Sitting in the chair Anasarca Cannot see neck veins well d/t size of neck BS's markedly diminished. + exp wheeze Distant heart sounds  S1S2 Can't appreciate murmur Sternotomy incision dressed Abdomen distended No focal tenderness  + BS Pitting edema of arms/hands 2+ pitting LE edema Absent toe left foot/worse edema on that side Dark yellow urine in foley Alert, oriented, just slow to speak Mild myoclonus   Recent Labs Lab 10/09/16 2101 10/10/16 0349 10/10/16 1537 10/10/16 1559 10/11/16 0405 10/12/16 0414 10/13/16 0341 10/14/16 0520  NA 140 140 137  --  135 134* 131* 127*  K 4.1 3.4* 3.9  --  3.7 3.0* 3.0* 3.9  CL 106 108 103  --  101 100* 96* 94*  CO2  --  25  --   --  25 26 23 23   GLUCOSE 156* 109* 153*  --  157* 110* 92 105*  BUN 29* 31* 29*  --  30* 33* 39* 50*  CREATININE 2.20* 2.37* 2.40* 2.49* 2.63* 3.10* 3.41* 3.98*  CALCIUM  --  7.8*  --   --  7.9* 8.0* 7.7* 7.5*     Recent Labs Lab 10/14/16 0520  AST 22  ALT 18  ALKPHOS 75  BILITOT 0.9  PROT 4.9*  ALBUMIN 2.4*     Recent Labs Lab 10/10/16 1559 10/11/16 0405 10/13/16 0341 10/14/16 0520  WBC 17.0* 15.2* 15.3* 16.6*  HGB 9.9* 9.4* 8.6* 8.5*  HCT 30.1* 28.3* 25.4* 26.2*  MCV 88.0 87.9 88.8 88.8  PLT 105* 99* 123* 143*     Recent Labs Lab 10/14/16 0114 10/14/16 0308 10/14/16 0817 10/14/16 1206 10/14/16 1527  GLUCAP 134* 102* 118* 176* 149*    Xrays/Other Studies: Dg Chest Port 1 View  Result Date: 10/14/2016 CLINICAL DATA:  Wheezing R06.2 (ICD-10-CM) EXAM: PORTABLE CHEST 1 VIEW COMPARISON:  10/13/2016. FINDINGS: Cardiomegaly, with sequelae of previous CABG. Worsening aeration, with pulmonary edema and bibasilar atelectasis as well as  effusions. No pneumothorax. Consolidation cannot be excluded in the dense retrocardiac region. Swan-Ganz introducer unchanged. IMPRESSION: Worsening aeration.  Increasing pulmonary edema. Electronically Signed   By: Staci Righter M.D.   On: 10/14/2016 07:18   Dg Chest Port 1 View  Result Date: 10/13/2016 CLINICAL DATA:  Status post CABG and Maze procedure 4 days ago. EXAM: PORTABLE CHEST 1 VIEW COMPARISON:  Portable chest x-ray of October 12, 2016 FINDINGS: The lungs are reasonably well inflated. The interstitial markings are more conspicuous today. The left hemidiaphragm is now obscured in the right hemidiaphragm partially obscured. There is fluid in the minor fissure. The cardiac silhouette remains enlarged. The pulmonary vascularity is engorged and indistinct. There is calcification in the wall of the thoracic aorta. The sternal wires and left atrial appendage appear intact and in stable position. The right internal jugular Cordis sheath tip projects approximately 3-4 cm above the expected junction with the subclavian vein. IMPRESSION: Worsening appearance of the chest consistent with CHF with interstitial edema. No pneumothorax. Thoracic aortic atherosclerosis. Electronically Signed   By: David  Martinique M.D.   On: 10/13/2016 07:41    Background:  83 y.o. year-old  with PMH DM, HTN, HLD, chronic dCHF, COPD, PAF, RA, secondary HPT. Known CKD3/4 (DM, HTN, prior NSAID use; followed by Dr. Marval Regal). Last creatinine in our office 2.61 08/28/16. Had heart cath 10/07/16 showing 90% LM ds. Had 2V CABG, MAZE procedure, clipping of atrial appendage by Dr. Roxy Manns  10/09/16. Has had insidious worsening of renal function since that time with creatinine today up to 3.98. UOP has trailed off past 48 hours from 1.5-2L range, 730 cc yesterday, only 255 cc today. On lasix drip since 9/28. UOP trailing off. Weight about 5-6 kg over pre op weight with CXR showing worsening pulmonary edema. Blood pressures low side with many  systolics in 57-26 range. We are asked to see.   Assessment/Recommendations  1. AKI on CKD3/4 - post CABG and MAZE procedure. Steady rise in creatinine, UOP has dropped off despite lasix drip. I believe hemodynamically driven (low blood pressures) in kidneys with little reserve. No appreciable response to bolus dose 160 mg of lasix earlier on top opf furosemide drip.  1. I think if measures implemented to raise BP (Dr. Roxy Manns planning addition of vasopressors) do not result in better diuretic responsiveness and/or if creatinine continues to rise, we are looking at CRRT in the next 24 hours. 2. Pressors per Dr. Roxy Manns 2. Anasarca/volume overload - around 5 kg over preop weight. CXR wet. Symptomatically SOB.  1. Continue diuretics for now  2. CRRT if no response 3. S/P CABG/MAZE procedure - low BP's post. Milrinone off. Co-ox high earlier today.  1. Decision about resumption of inotropes per Dr. Roxy Manns 4. Anemia - ABLA. Has not had appreciable CKD related anemia and has not required ESA's in the past 1. Transfuse for Hb <7 2. Will check Fe studies and dose Aranesp 150 today 5. Diabetes with neuropathy - on mod dose of gabapentin. Some mild myoclonus. 1. Hold gabapentin for now as may be seeing early side effects from drug accumulation  Discussed above with family and with Dr. Roxy Manns. Patient aware of possible need for CRRT next 12-18 hours.  Jamal Maes,  MD [facility] [phone] pager 10/14/2016, 4:35 PM

## 2016-10-14 NOTE — Clinical Social Work Note (Addendum)
Clinical Social Work Assessment  Patient Details  Name: [name] MRN: [id number] Date of Birth: April 17, 1930  Date of referral:  10/13/16               Reason for consult:  Facility Placement                Permission sought to share information with:  Case Manager Permission granted to share information::  Yes, Verbal Permission Granted  Name::     [name], [phone], spouse  Agency::  SNF  Relationship::     Contact Information:     Housing/Transportation Living arrangements for the past 2 months:  Single Family Home Source of Information:  Patient Patient Interpreter Needed:  None Criminal Activity/Legal Involvement Pertinent to Current Situation/Hospitalization:  No - Comment as needed Significant Relationships:  Adult Children, Spouse Lives with:  Spouse Do you feel safe going back to the place where you live?  No Need for family participation in patient care:  Yes (Comment)  Care giving concerns:  Pt from home with spouse. Pt was independent at home with ADL's prior to hospitalization.  Pt indicated that his wife cannot care for him with new impairment and he will need short term rehab placement at SNF.  Spouse is dealing with dementia so adult daughters provide support as well.   Social Worker assessment / plan:  CSW met with patient at bedside to discuss the clinical teams's recommendation for SNF placement at discharge. Patient in agreement with SNF. CSw explained the SNF process and options. Pt encouraged CSW to speak with spouse. CSW will f/u. Pt gave CSW permission to send out to SNF's in [place]. Pt wants to go to [place].  Employment status:  Retired Forensic scientist:  Medicare PT Recommendations:  [place] / Referral to community resources:  [place]  Patient/Family's Response to care:  Pt appreciative of CSW assistance with SNF placement/options. No issues or concerns identified at this  time.  Patient/Family's Understanding of and Emotional Response to Diagnosis, Current Treatment, and Prognosis:  Patient has good understanding of diagnosis, current treatment and prognosis. Pt hopeful he will continue to improve with SNF and will return to his home independence prior to impairment. No issues or concerns identified at this time.  Emotional Assessment Appearance:  Appears stated age Attitude/Demeanor/Rapport:   (Cooperative) Affect (typically observed):  Accepting, Appropriate Orientation:  Oriented to Self, Oriented to Place, Oriented to  Time, Oriented to Situation Alcohol / Substance use:  Not Applicable Psych involvement (Current and /or in the community):  No (Comment)  Discharge Needs  Concerns to be addressed:  Care Coordination Readmission within the last 30 days:  No Current discharge risk:  Dependent with Mobility, Physical Impairment Barriers to Discharge:  No Barriers Identified   Normajean Baxter, LCSW 10/14/2016, 10:55 AM

## 2016-10-14 NOTE — Progress Notes (Addendum)
RN assisting patient to get up to side of the bed, pt very weak, unable to hold himself up. Crackles and wheezes auscultated across all lung fields. Pt too weak to stand safely on scale or ambulate at this time. Dr. Prescott Gum paged and made aware, orders for stat ABG. Xray in the room at this time. RN inquired about breathing treatment, as pt has no orders for any, no orders for this at this time. Pt sats 93% on 2L [place], RR WNL, BP stable and unchanged from overnight trends. Pt states he feels short of breath, O2 increased for patient comfort. Pt assisted back to bed in high-fowlers position. Respiratory aware of stat orders, RN will continue to monitor closely.

## 2016-10-15 ENCOUNTER — Inpatient Hospital Stay (HOSPITAL_COMMUNITY): Payer: Medicare Other

## 2016-10-15 DIAGNOSIS — N179 Acute kidney failure, unspecified: Secondary | ICD-10-CM

## 2016-10-15 DIAGNOSIS — J9601 Acute respiratory failure with hypoxia: Secondary | ICD-10-CM

## 2016-10-15 DIAGNOSIS — I5023 Acute on chronic systolic (congestive) heart failure: Secondary | ICD-10-CM

## 2016-10-15 DIAGNOSIS — J9602 Acute respiratory failure with hypercapnia: Secondary | ICD-10-CM

## 2016-10-15 DIAGNOSIS — J810 Acute pulmonary edema: Secondary | ICD-10-CM

## 2016-10-15 LAB — RENAL FUNCTION PANEL
ALBUMIN: 2.2 g/dL — AB (ref 3.5–5.0)
Albumin: 2.4 g/dL — ABNORMAL LOW (ref 3.5–5.0)
Anion gap: 15 (ref 5–15)
Anion gap: 15 (ref 5–15)
BUN: 55 mg/dL — AB (ref 6–20)
BUN: 54 mg/dL — AB (ref 6–20)
CHLORIDE: 88 mmol/L — AB (ref 101–111)
CHLORIDE: 92 mmol/L — AB (ref 101–111)
CO2: 22 mmol/L (ref 22–32)
CO2: 19 mmol/L — ABNORMAL LOW (ref 22–32)
CREATININE: 4.29 mg/dL — AB (ref 0.61–1.24)
CREATININE: 4.09 mg/dL — AB (ref 0.61–1.24)
Calcium: 7.5 mg/dL — ABNORMAL LOW (ref 8.9–10.3)
Calcium: 7.1 mg/dL — ABNORMAL LOW (ref 8.9–10.3)
GFR calc Af Amer: 13 mL/min — ABNORMAL LOW (ref >60)
GFR calc Af Amer: 14 mL/min — ABNORMAL LOW (ref >60)
GFR calc non Af Amer: 12 mL/min — ABNORMAL LOW (ref >60)
GFR, EST NON AFRICAN AMERICAN: 12 mL/min — AB (ref >60)
GLUCOSE: 125 mg/dL — AB (ref 65–99)
GLUCOSE: 144 mg/dL — AB (ref 65–99)
POTASSIUM: 4.2 mmol/L (ref 3.5–5.1)
Phosphorus: 7.3 mg/dL — ABNORMAL HIGH (ref 2.5–4.6)
Phosphorus: 7.1 mg/dL — ABNORMAL HIGH (ref 2.5–4.6)
Potassium: 4.5 mmol/L (ref 3.5–5.1)
Sodium: 125 mmol/L — ABNORMAL LOW (ref 135–145)
Sodium: 126 mmol/L — ABNORMAL LOW (ref 135–145)

## 2016-10-15 LAB — PROTIME-INR
INR: 1.23
PROTHROMBIN TIME: 15.4 s — AB (ref 11.4–15.2)

## 2016-10-15 LAB — GLUCOSE, CAPILLARY
GLUCOSE-CAPILLARY: 91 mg/dL (ref 65–99)
Glucose-Capillary: 134 mg/dL — ABNORMAL HIGH (ref 65–99)
Glucose-Capillary: 116 mg/dL — ABNORMAL HIGH (ref 65–99)
Glucose-Capillary: 153 mg/dL — ABNORMAL HIGH (ref 65–99)
Glucose-Capillary: 120 mg/dL — ABNORMAL HIGH (ref 65–99)
Glucose-Capillary: 88 mg/dL (ref 65–99)

## 2016-10-15 LAB — POCT ACTIVATED CLOTTING TIME
ACTIVATED CLOTTING TIME: 158 s
ACTIVATED CLOTTING TIME: 153 s
ACTIVATED CLOTTING TIME: 158 s
ACTIVATED CLOTTING TIME: 158 s
ACTIVATED CLOTTING TIME: 175 s
Activated Clotting Time: 131 seconds
Activated Clotting Time: 142 seconds
Activated Clotting Time: 158 seconds
Activated Clotting Time: 158 seconds
Activated Clotting Time: 169 seconds

## 2016-10-15 LAB — CBC
HCT: 24.9 % — ABNORMAL LOW (ref 39.0–52.0)
Hemoglobin: 8.5 g/dL — ABNORMAL LOW (ref 13.0–17.0)
MCH: 30.1 pg (ref 26.0–34.0)
MCHC: 34.1 g/dL (ref 30.0–36.0)
MCV: 88.3 fL (ref 78.0–100.0)
PLATELETS: 183 10*3/uL (ref 150–400)
RBC: 2.82 MIL/uL — ABNORMAL LOW (ref 4.22–5.81)
RDW: 14.6 % (ref 11.5–15.5)
WBC: 15 10*3/uL — AB (ref 4.0–10.5)

## 2016-10-15 LAB — IRON AND TIBC
Iron: 8 ug/dL — ABNORMAL LOW (ref 45–182)
Saturation Ratios: 6 % — ABNORMAL LOW (ref 17.9–39.5)
TIBC: 140 ug/dL — ABNORMAL LOW (ref 250–450)
UIBC: 132 ug/dL

## 2016-10-15 LAB — PHOSPHORUS: Phosphorus: 7 mg/dL — ABNORMAL HIGH (ref 2.5–4.6)

## 2016-10-15 LAB — MAGNESIUM: MAGNESIUM: 2.2 mg/dL (ref 1.7–2.4)

## 2016-10-15 MED ORDER — FENTANYL CITRATE (PF) 100 MCG/2ML IJ SOLN: 100.0000 ug | Freq: Once | INTRAMUSCULAR | Status: DC

## 2016-10-15 MED ORDER — METOPROLOL TARTRATE 25 MG/10 ML ORAL SUSPENSION
25.0000 mg | Freq: Two times a day (BID) | ORAL | Status: DC
  Filled 2016-10-15: qty 10

## 2016-10-15 MED ORDER — PRO-STAT SUGAR FREE PO LIQD
30.0000 mL | Freq: Two times a day (BID) | ORAL | Status: DC
  Administered 2016-10-17 – 2016-10-20 (×6): 30 mL
  Filled 2016-10-15 (×5): qty 30

## 2016-10-15 MED ORDER — ASPIRIN 300 MG RE SUPP
300.0000 mg | Freq: Every day | RECTAL | Status: DC
  Administered 2016-10-15 – 2016-10-18 (×4): 300 mg via RECTAL
  Filled 2016-10-15 (×5): qty 1

## 2016-10-15 MED ORDER — HEPARIN (PORCINE) 2000 UNITS/L FOR CRRT
INTRAVENOUS_CENTRAL | Status: DC | PRN
  Administered 2016-10-19 – 2016-10-21 (×2): via INTRAVENOUS_CENTRAL
  Filled 2016-10-15: qty 1000

## 2016-10-15 MED ORDER — PANTOPRAZOLE SODIUM 40 MG IV SOLR
40.0000 mg | INTRAVENOUS | Status: DC
  Administered 2016-10-15: 40 mg via INTRAVENOUS
  Filled 2016-10-15: qty 40

## 2016-10-15 MED ORDER — ORAL CARE MOUTH RINSE: 15.0000 mL | Freq: Two times a day (BID) | OROMUCOSAL | Status: DC

## 2016-10-15 MED ORDER — FOLIC ACID 1 MG PO TABS
1.0000 mg | ORAL_TABLET | Freq: Every day | ORAL | Status: DC
  Administered 2016-10-16 – 2016-10-28 (×13): 1 mg
  Filled 2016-10-15 (×13): qty 1

## 2016-10-15 MED ORDER — ETOMIDATE 2 MG/ML IV SOLN
20.0000 mg | Freq: Once | INTRAVENOUS | Status: AC
  Administered 2016-10-15: 20 mg via INTRAVENOUS

## 2016-10-15 MED ORDER — ATORVASTATIN CALCIUM 40 MG PO TABS
40.0000 mg | ORAL_TABLET | Freq: Every day | ORAL | Status: DC
  Administered 2016-10-15: 40 mg
  Filled 2016-10-15: qty 1

## 2016-10-15 MED ORDER — MIDAZOLAM HCL 2 MG/2ML IJ SOLN: 2.0000 mg | Freq: Once | INTRAMUSCULAR | Status: DC

## 2016-10-15 MED ORDER — FENTANYL CITRATE (PF) 100 MCG/2ML IJ SOLN
INTRAMUSCULAR | Status: AC
  Administered 2016-10-15: 100 ug
  Filled 2016-10-15: qty 2

## 2016-10-15 MED ORDER — LEVOTHYROXINE SODIUM 25 MCG PO TABS
25.0000 ug | ORAL_TABLET | Freq: Every day | ORAL | Status: DC
  Administered 2016-10-16 – 2016-11-03 (×18): 25 ug
  Filled 2016-10-15 (×18): qty 1

## 2016-10-15 MED ORDER — SODIUM CHLORIDE 0.9 % IV SOLN
0.0000 ug/min | INTRAVENOUS | Status: DC
  Administered 2016-10-15: 100 ug/min via INTRAVENOUS
  Administered 2016-10-15 (×3): 200 ug/min via INTRAVENOUS
  Administered 2016-10-16: 125 ug/min via INTRAVENOUS
  Administered 2016-10-16: 160 ug/min via INTRAVENOUS
  Administered 2016-10-16: 180 ug/min via INTRAVENOUS
  Administered 2016-10-16: 150 ug/min via INTRAVENOUS
  Administered 2016-10-16: 180 ug/min via INTRAVENOUS
  Administered 2016-10-17 (×3): 130 ug/min via INTRAVENOUS
  Administered 2016-10-17 (×2): 200 ug/min via INTRAVENOUS
  Administered 2016-10-18: 110 ug/min via INTRAVENOUS
  Administered 2016-10-18: 50 ug/min via INTRAVENOUS
  Filled 2016-10-15 (×17): qty 4

## 2016-10-15 MED ORDER — CHLORHEXIDINE GLUCONATE 0.12% ORAL RINSE (MEDLINE KIT)
15.0000 mL | Freq: Two times a day (BID) | OROMUCOSAL | Status: DC
  Administered 2016-10-15 – 2016-10-18 (×7): 15 mL via OROMUCOSAL

## 2016-10-15 MED ORDER — ORAL CARE MOUTH RINSE
15.0000 mL | OROMUCOSAL | Status: DC
Start: 2016-10-15 — End: 2016-10-19
  Administered 2016-10-15 – 2016-10-19 (×34): 15 mL via OROMUCOSAL

## 2016-10-15 MED ORDER — SODIUM CHLORIDE 0.9 % IJ SOLN
250.0000 [IU]/h | INTRAMUSCULAR | Status: DC
  Administered 2016-10-15: 1250 [IU]/h via INTRAVENOUS_CENTRAL
  Administered 2016-10-15: 250 [IU]/h via INTRAVENOUS_CENTRAL
  Administered 2016-10-16 (×3): 1350 [IU]/h via INTRAVENOUS_CENTRAL
  Administered 2016-10-17: 1150 [IU]/h via INTRAVENOUS_CENTRAL
  Administered 2016-10-19: 250 [IU]/h via INTRAVENOUS_CENTRAL
  Administered 2016-10-20: 500 [IU]/h via INTRAVENOUS_CENTRAL
  Administered 2016-10-20: 250 [IU]/h via INTRAVENOUS_CENTRAL
  Administered 2016-10-21 (×2): 750 [IU]/h via INTRAVENOUS_CENTRAL
  Administered 2016-10-22: 950 [IU]/h via INTRAVENOUS_CENTRAL
  Administered 2016-10-22: 750 [IU]/h via INTRAVENOUS_CENTRAL
  Administered 2016-10-23: 950 [IU]/h via INTRAVENOUS_CENTRAL
  Filled 2016-10-15 (×14): qty 2

## 2016-10-15 MED ORDER — PRISMASOL BGK 4/2.5 32-4-2.5 MEQ/L IV SOLN
INTRAVENOUS | Status: DC
  Administered 2016-10-15 – 2016-10-22 (×12): via INTRAVENOUS_CENTRAL
  Filled 2016-10-15 (×16): qty 5000

## 2016-10-15 MED ORDER — ROCURONIUM BROMIDE 50 MG/5ML IV SOLN: 50.0000 mg | Freq: Once | INTRAVENOUS | Status: DC

## 2016-10-15 MED ORDER — HEPARIN BOLUS VIA INFUSION (CRRT)
1000.0000 [IU] | INTRAVENOUS | Status: DC | PRN
  Filled 2016-10-15: qty 1000

## 2016-10-15 MED ORDER — PRISMASOL BGK 4/2.5 32-4-2.5 MEQ/L IV SOLN
INTRAVENOUS | Status: DC
  Administered 2016-10-15 – 2016-10-23 (×33): via INTRAVENOUS_CENTRAL
  Filled 2016-10-15 (×52): qty 5000

## 2016-10-15 MED ORDER — MIDAZOLAM HCL 2 MG/2ML IJ SOLN
INTRAMUSCULAR | Status: AC
  Administered 2016-10-15: 2 mg
  Filled 2016-10-15: qty 2

## 2016-10-15 MED ORDER — FENTANYL CITRATE (PF) 100 MCG/2ML IJ SOLN
25.0000 ug | INTRAMUSCULAR | Status: DC | PRN
  Administered 2016-10-15 – 2016-10-16 (×3): 50 ug via INTRAVENOUS
  Filled 2016-10-15 (×3): qty 2

## 2016-10-15 MED ORDER — HEPARIN SODIUM (PORCINE) 1000 UNIT/ML DIALYSIS
1000.0000 [IU] | INTRAMUSCULAR | Status: DC | PRN
  Administered 2016-10-16: 3000 [IU] via INTRAVENOUS_CENTRAL
  Administered 2016-10-23: 2000 [IU] via INTRAVENOUS_CENTRAL
  Filled 2016-10-15: qty 2
  Filled 2016-10-15 (×3): qty 6
  Filled 2016-10-15: qty 2
  Filled 2016-10-15: qty 3

## 2016-10-15 MED ORDER — MIDAZOLAM HCL 2 MG/2ML IJ SOLN
1.0000 mg | INTRAMUSCULAR | Status: DC | PRN
  Administered 2016-10-15 – 2016-10-18 (×9): 2 mg via INTRAVENOUS
  Administered 2016-10-18: 1 mg via INTRAVENOUS
  Administered 2016-10-18 – 2016-10-19 (×4): 2 mg via INTRAVENOUS
  Administered 2016-10-19: 1 mg via INTRAVENOUS
  Administered 2016-10-19 (×2): 2 mg via INTRAVENOUS
  Administered 2016-10-19: 1 mg via INTRAVENOUS
  Administered 2016-10-19 – 2016-10-20 (×3): 2 mg via INTRAVENOUS
  Filled 2016-10-15 (×22): qty 2

## 2016-10-15 MED ORDER — CHLORHEXIDINE GLUCONATE 0.12 % MT SOLN: 15.0000 mL | Freq: Two times a day (BID) | OROMUCOSAL | Status: DC

## 2016-10-15 MED ORDER — VITAL HIGH PROTEIN PO LIQD
1000.0000 mL | ORAL | Status: DC
Start: 2016-10-15 — End: 2016-10-16

## 2016-10-15 MED ORDER — PRISMASOL BGK 4/2.5 32-4-2.5 MEQ/L IV SOLN
INTRAVENOUS | Status: DC
  Administered 2016-10-15 – 2016-10-22 (×7): via INTRAVENOUS_CENTRAL
  Filled 2016-10-15 (×12): qty 5000

## 2016-10-15 MED ORDER — SODIUM CHLORIDE 0.9 % IV SOLN
510.0000 mg | INTRAVENOUS | Status: AC
  Administered 2016-10-15 – 2016-10-22 (×2): 510 mg via INTRAVENOUS
  Filled 2016-10-15 (×3): qty 17

## 2016-10-15 NOTE — Progress Notes (Signed)
RN asked RT to place patient on BiPAP per MD order. Placed on 16/8, 60%. Seems to be tolerating well. SAT 94%, BBS rhonchi. RT to monitor

## 2016-10-15 NOTE — Progress Notes (Signed)
Upon walking into to pt's room to greet pt's family, pt's wife was feeding him.  O2 sats were slowly dropping to the mid 80s.  Pt placed on non-rebreather and pt started saturating better.  Pt was not coughing at all.  I educated pt & family on importance of making sure RN/tech is in room to ensure he is sitting up properly and mentating approprietly before eating/drinking. Emotional support given. Will continue to monitor closely and update as needed.

## 2016-10-15 NOTE — Procedures (Signed)
Arterial Catheter Insertion Procedure Note [name] [id number] 1930-06-12  Procedure: Insertion of Arterial Catheter  Indications: Blood pressure monitoring and Frequent blood sampling  Procedure Details Consent: Unable to obtain consent because of emergent medical necessity. Time Out: Verified patient identification, verified procedure, site/side was marked, verified correct patient position, special equipment/implants available, medications/allergies/relevent history reviewed, required imaging and test results available.  Performed  Maximum sterile technique was used including antiseptics, cap, gloves, gown, hand hygiene, mask and sheet. Skin prep: Chlorhexidine; local anesthetic administered 20 gauge catheter was inserted into left radial artery using the Seldinger technique.  Evaluation Blood flow good; BP tracing good. Complications: No apparent complications.   [name] 10/15/2016

## 2016-10-15 NOTE — Progress Notes (Signed)
Brief Nutrition Note  Consult received for enteral/tube feeding initiation and management.  Adult Enteral Nutrition Protocol initiated. Full assessment to follow.  Admitting Dx: CAD  Body mass index is 37.71 kg/m. Pt meets criteria for obesity class II based on current BMI.  Labs:   Recent Labs Lab 10/10/16 0349  10/10/16 1559  10/12/16 0414 10/13/16 0341 10/14/16 0520 10/15/16 0433  NA 140  < >  --   < > 134* 131* 127* 125*  K 3.4*  < >  --   < > 3.0* 3.0* 3.9 4.5  CL 108  < >  --   < > 100* 96* 94* 88*  CO2 25  --   --   < > 26 23 23 22   BUN 31*  < >  --   < > 33* 39* 50* 55*  CREATININE 2.37*  < > 2.49*  < > 3.10* 3.41* 3.98* 4.29*  CALCIUM 7.8*  --   --   < > 8.0* 7.7* 7.5* 7.5*  MG 2.9*  --  2.6*  --  2.2  --   --   --   PHOS  --   --   --   --   --   --   --  7.3*  GLUCOSE 109*  < >  --   < > 110* 92 105* 125*  < > = values in this interval not displayed.  [place], [place], Kathleen Pager [phone] After Hours Pager

## 2016-10-15 NOTE — Progress Notes (Addendum)
PT Cancellation Note  Patient Details Name: [name] MRN: [id number] DOB: 1930-05-23   Cancelled Treatment:    Reason Eval/Treat Not Completed: Medical issues which prohibited therapy (Pt on HOLD today due to placedon BIPAP. For CRRT as well).  Will check back tomorrow.     Denice Paradise 10/15/2016, 10:47 AM [facility] [phone] (pager)

## 2016-10-15 NOTE — Progress Notes (Signed)
Pulled ET tube back from 25 cm to 23 cm per order. Bilateral breath sounds noted. Vitals are stable and sats are 97%. RT will continue to monitor.

## 2016-10-15 NOTE — Procedures (Signed)
OGT Placement BY MD  OGT placed under direct laryngoscopy and verified by auscultation  Rush Farmer, M.D. [facility]. Pager: [phone]. After hours pager: [phone].

## 2016-10-15 NOTE — Social Work (Signed)
CSW discussed patient's current health with admission staff Jinger Neighbors of [place]. She was asking about a chest tube and patient's chronic kidney disease. CSW advised that patient does not have a chest tube and he is not receiving hemodialysis at this time. CSW assured that patient's plan is to return home once short term rehab is complete.  CSW will continue to follow up for disposition.   Elissa Hefty, LCSW Clinical Social Worker [phone]

## 2016-10-15 NOTE — Progress Notes (Signed)
[address]       ,[place] [address]             [phone]        CARDIOTHORACIC SURGERY PROGRESS NOTE   R6 Days Post-Op Procedure(s) (LRB): CORONARY ARTERY BYPASS GRAFTING (CABG) x two, using left internal mammary artery and right leg greater saphenous vein harvested endoscopically (N/A) MAZE (N/A) TRANSESOPHAGEAL ECHOCARDIOGRAM (TEE) (N/A) CLIPPING OF ATRIAL APPENDAGE  Subjective: Patient appears more weak and fatigued this morning. He is somnolent in bed, but arouses to verbal and painful stimuli.   Objective: Vital signs: BP Readings from Last 1 Encounters:  10/15/16 (!) 108/57   Pulse Readings from Last 1 Encounters:  10/15/16 82   Resp Readings from Last 1 Encounters:  10/15/16 11   Temp Readings from Last 1 Encounters:  10/15/16 98.3 F (36.8 C) (Oral)    Hemodynamics:    Physical Exam:  Rhythm:   Junctional at 80  Breath sounds: Crackles bilaterally  Heart sounds:  RRR, no m/r/g  Incisions:  Median Sternotomy incision is clean, dry, intact without erythema or drainage  Abdomen:  Abdomen is mildly distended, non-tender, +BS all 4 quadrants  Extremities:  2+ pitting edema to the bilateral LE, Bilateral UE grossly edematous   Intake/Output from previous day: 10/02 0701 - 10/03 0700 In: 2002.3 [P.O.:120; I.V.:1882.3] Out: 420 [Urine:420] Intake/Output this shift: No intake/output data recorded.  Lab Results:  CBC:  Recent Labs  10/14/16 0520 10/15/16 0433  WBC 16.6* 15.0*  HGB 8.5* 8.5*  HCT 26.2* 24.9*  PLT 143* 183    BMET:   Recent Labs  10/14/16 0520 10/15/16 0433  NA 127* 125*  K 3.9 4.5  CL 94* 88*  CO2 23 22  GLUCOSE 105* 125*  BUN 50* 55*  CREATININE 3.98* 4.29*  CALCIUM 7.5* 7.5*     PT/INR:    Recent Labs  10/15/16 0433  LABPROT 15.4*  INR 1.23    CBG (last 3)   Recent Labs  10/14/16 2044 10/14/16 2339 10/15/16 0327  GLUCAP 179* 111* 134*    ABG    Component Value Date/Time     PHART 7.275 (L) 10/14/2016 0604   PCO2ART 49.2 (H) 10/14/2016 0604   PO2ART 83.7 10/14/2016 0604   HCO3 22.1 10/14/2016 0604   TCO2 23 10/10/2016 1537   ACIDBASEDEF 3.7 (H) 10/14/2016 0604   O2SAT 96.0 10/14/2016 0604    CXR: PORTABLE CHEST 1 VIEW  COMPARISON:  October 14, 2016  FINDINGS: Cordis tip is in the right jugular vein, stable. No pneumothorax. There is cardiomegaly with pulmonary venous hypertension. There is relatively mild interstitial edema with minimal pleural effusions bilaterally. There is midlung atelectatic change bilaterally.  Patient is status post coronary artery bypass grafting. There is an atrial appendage clamp on the left. There is aortic atherosclerosis.  Postoperative changes noted in the right cervical-thoracic junction region. There is a total shoulder replacement on the left.  IMPRESSION: Findings indicative of a degree of congestive heart failure. There appears to be slightly less edema compared to 1 day prior. No new opacity evident. Stable cardiac enlargement. There is aortic atherosclerosis. No pneumothorax.   Assessment/Plan: S/P Procedure(s) (LRB): CORONARY ARTERY BYPASS GRAFTING (CABG) x two, using left internal mammary artery and right leg greater saphenous vein harvested endoscopically (N/A) MAZE (N/A) TRANSESOPHAGEAL ECHOCARDIOGRAM (TEE) (N/A) CLIPPING OF ATRIAL APPENDAGE  Cardiovascular - Hemodynamically stable. Currently in Junctional Rhythm at 81 bpm on 30 mg/hr Amiodarone and  25 mg Metoprolol. BP 108/57 on 0.3 mcg/Hr of Dopamine and on 75 mcg/min of phenylephrine   INR - INR this morning is 1.23 on 5mg  Warfarin. Continue to monitor INR.   Pulmonary -SpO2 96% on 4L [place], wean O2 as tolerated. Still has bilateral crackles on examination. CXR this morning shows congestive heart failure with slightly less edema than 1 day prior. Encouraged IS and mobilization as tolerable.   Anemia- Expected post-operative. H&H 8.5 and  24.9. Continue ferrous sulfate and monitor.   Fluid Overload- Expected post-operative. Patient is now 13 kg above pre-operative weight. I/O +1449.7 in the last 24 hours on 15 mg/hr Lasix drip, concern that this is no longer sufficient. May need CRRT  Renal Impairement -sCr elevated to 4.29 this morning. Urine output slowed to 420 in last 24 hours. On 0.88mcg/hr Dopamine to help increase renal perfusion. 160 mg Lasix push will be given. Nephrology agreeable with plan for inotropes and if that fails consider CRRT.   Hypokalemia -K+ 3.9 this morning. Supplementation ordered, Will need to closely monitor with renal impairment   CBG -179/111/134 - Continue ACHS glucose checks and SSI PRN.   Continue Dopamine, Phenylephrine for BP control and renal perfusion (MAP Goal 70-90)  IV Amiodarone/PO Metoprolol for rate control Kidney function continues to decline, edema increased, nephrololgy on board, likely needs CRRT Mobilize as tolerated Supplement K+ and monitor sCr, Urine output Monitor INR  Edison Simon, PA-S 10/15/2016 7:56 AM  I have seen and examined the patient and agree with the assessment and plan as outlined.  Renal function has not improved.  Patient remains in sinus rhythm and hemodynamically stable, and ECHO reveals stable (low normal) LV systolic function, normal valve function and no pericardial effusion.  I agree that patient probably needs CRRT initiated due to worsening volume overload and electrolyte imbalance.  Rexene Alberts, MD 10/15/2016 9:11 AM

## 2016-10-15 NOTE — Procedures (Signed)
Intubation Procedure Note RYLYNN SCHONEMAN [id number] [birth year]  Procedure: Intubation Indications: Airway protection and maintenance  Procedure Details Consent: Risks of procedure as well as the alternatives and risks of each were explained to the (patient/caregiver).  Consent for procedure obtained. Time Out: Verified patient identification, verified procedure, site/side was marked, verified correct patient position, special equipment/implants available, medications/allergies/relevent history reviewed, required imaging and test results available.  Performed  Maximum sterile technique was used including gloves, hand hygiene and mask.  MAC    Evaluation Hemodynamic Status: BP stable throughout; O2 sats: stable throughout Patient's Current Condition: stable Complications: No apparent complications Patient did tolerate procedure well. Chest X-ray ordered to verify placement.  CXR: pending.   Montford Barg 10/15/2016

## 2016-10-15 NOTE — Progress Notes (Signed)
Patient ID: [name], male   DOB: October 25, 1930, 83 y.o.   MRN: [id number]  TCTS Evening Rounds:   Intubated today and placed on CRRT  BP ok on neo 200 mcg, dopamine.      CBC    Component Value Date/Time   WBC 15.0 (H) 10/15/2016 0433   RBC 2.82 (L) 10/15/2016 0433   HGB 8.5 (L) 10/15/2016 0433   HCT 24.9 (L) 10/15/2016 0433   PLT 183 10/15/2016 0433   MCV 88.3 10/15/2016 0433   MCH 30.1 10/15/2016 0433   MCHC 34.1 10/15/2016 0433   RDW 14.6 10/15/2016 0433   LYMPHSABS 1.7 07/01/2016 1417   MONOABS 0.7 07/01/2016 1417   EOSABS 0.2 07/01/2016 1417   BASOSABS 0.1 07/01/2016 1417     BMET    Component Value Date/Time   NA 126 (L) 10/15/2016 1600   K 4.2 10/15/2016 1600   CL 92 (L) 10/15/2016 1600   CO2 19 (L) 10/15/2016 1600   GLUCOSE 144 (H) 10/15/2016 1600   BUN 54 (H) 10/15/2016 1600   CREATININE 4.09 (H) 10/15/2016 1600   CALCIUM 7.1 (L) 10/15/2016 1600   GFRNONAA 12 (L) 10/15/2016 1600   GFRAA 14 (L) 10/15/2016 1600     A/P:  Acute on chronic renal failure now on CRRT          Acute respiratory failure due to the above. Continue vent support.

## 2016-10-15 NOTE — Procedures (Signed)
HD Central Venous Catheter Insertion Procedure Note [name] [id number] Aug 26, 1930  Procedure: Insertion of Central Venous Catheter Indications: Renal failure Need HD   Procedure Details Consent: Risks of procedure as well as the alternatives and risks of each were explained to the (patient/caregiver).  Consent for procedure obtained. Time Out: Verified patient identification, verified procedure, site/side was marked, verified correct patient position, special equipment/implants available, medications/allergies/relevent history reviewed, required imaging and test results available.  Performed  Maximum sterile technique was used including antiseptics, cap, gloves, gown, hand hygiene, mask and sheet. Skin prep: Chlorhexidine; local anesthetic administered A antimicrobial bonded/coated triple lumen catheter was placed in the left subclavian vein using the Seldinger technique.  Evaluation Blood flow good Complications: No apparent complications Patient did tolerate procedure well. Chest X-ray ordered to verify placement.  CXR: pending.  U/S used in placement.  [name] 10/15/2016, 12:44 PM

## 2016-10-15 NOTE — Consult Note (Signed)
PULMONARY / CRITICAL CARE MEDICINE   Name: [name] MRN: [id number] DOB: July 05, 1930    ADMISSION DATE:  10/09/2016 CONSULTATION DATE:  10/15/2016  REFERRING MD:  Ricard Dillon  CHIEF COMPLAINT:  Respiratory failure and acute pulmonary edema  HISTORY OF PRESENT ILLNESS:   83 year old male with extensive PMH who presents to PCCM after a CABG and MAZE.  In the ICU, renal function continued to deteriorate and patient stopped responding to lasix.  Renal service was consulted and CRRT was recommended.  Patient developed respiratory failure and BiPAP was started.  PCCM was consulted.  Patient in respiratory failure.  PAST MEDICAL HISTORY :  He  has a past medical history of Anemia; Asthma; Atrial fibrillation ([place]) (12/08/2013); CHF (congestive heart failure) ([place]); Chronic joint pain; CKD (chronic kidney disease), stage III; Constipation; COPD (chronic obstructive pulmonary disease) ([place]); Coronary artery disease; Coronary artery disease involving native coronary artery of native heart with angina pectoris ([place]) (09/30/2016); Dysrhythmia; GERD (gastroesophageal reflux disease); History of blood transfusion; Hyperlipidemia; Hypertension; Hypothyroidism; Impaired hearing; Joint swelling; Peripheral neuropathy; Pneumonia (2017); Rheumatoid arthritis ([place]); S/P CABG x 2 (10/09/2016); S/P Maze operation for atrial fibrillation (10/09/2016); and Type II diabetes mellitus ([place]).  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  No Known Allergies  No current facility-administered medications on file prior to encounter.    Current Outpatient Prescriptions on File Prior to Encounter  Medication Sig  . Abatacept (ORENCIA IV) Inject into the vein every 30 (thirty) days. Infusion due again October 2018  . albuterol (PROVENTIL) (2.5 MG/3ML) 0.083% nebulizer solution Take 3 mLs (2.5 mg total) by nebulization every 6 (six) hours as needed for wheezing or shortness of breath.  [name] aspirin 81 MG chewable tablet Chew 1 tablet (81 mg total) by mouth daily.  [name] atorvastatin (LIPITOR) 40 MG tablet Take 40 mg by mouth daily.  . ferrous sulfate 325 (65 FE) MG tablet Take 325 mg by mouth daily with breakfast.   . finasteride (PROSCAR) 5 MG tablet Take 5 mg by mouth at bedtime.   . fluticasone furoate-vilanterol (BREO ELLIPTA) 100-25 MCG/INH AEPB Inhale 1 puff into the lungs daily.  . folic acid (FOLVITE) 1 MG tablet Take 1 mg by mouth daily.    . furosemide (LASIX) 80 MG tablet Take 80 mg by mouth 2 (two) times daily.  [name] gabapentin (NEURONTIN) 300 MG capsule Take 1 capsule (300 mg total) by mouth 2 (two) times daily.  [name] guaiFENesin (MUCINEX) 600 MG 12 hr tablet Take 1,200 mg by mouth 2 (two) times daily.   [name] HYDROcodone-acetaminophen (NORCO) 5-325 MG per tablet Take 1 tablet by mouth every 6 (six) hours as needed for moderate pain. (Patient taking differently: Take 1 tablet by mouth every 6 (six) hours as needed for moderate pain. [facility])  . Insulin Glargine (TOUJEO SOLOSTAR) 300 UNIT/ML SOPN Inject 14 Units into the skin at bedtime.   [name]  isosorbide-hydrALAZINE (BIDIL) 20-37.5 MG tablet Take 1 tablet by mouth  3 (three) times daily.  [name] leflunomide (ARAVA) 20 MG tablet Take 20 mg by mouth daily.   [name] levothyroxine (SYNTHROID, LEVOTHROID) 25 MCG tablet Take 25 mcg by mouth daily before breakfast.  . metoprolol (LOPRESSOR) 50 MG tablet Take 25 mg by mouth 2 (two) times daily.   . ranitidine (ZANTAC) 150 MG tablet Take 150 mg by mouth 2 (two) times daily.    . Tamsulosin HCl (FLOMAX) 0.4 MG CAPS Take 0.4 mg by mouth at bedtime.   . traZODone (DESYREL) 50 MG tablet Take 50 mg by mouth at bedtime as needed for sleep.    FAMILY HISTORY:  His indicated that his mother is deceased. He indicated that his father is deceased.    SOCIAL HISTORY: He  reports that he quit smoking about 51 years ago. His smoking use included Cigarettes. He has a 15.00 pack-year smoking history. He has never used smokeless tobacco. He reports that he does not drink alcohol or use drugs.  REVIEW OF SYSTEMS:   Unattainable, patient is unresponsive.  SUBJECTIVE:  Can't breath  VITAL SIGNS: BP (!) 103/52   Pulse 78   Temp 98.3 F (36.8 C) (Oral)   Resp (!) 9   Ht 5\' 8"  (1.727 m)   Wt 112.5 kg (248 lb)   SpO2 92%   BMI 37.71 kg/m   HEMODYNAMICS:    VENTILATOR SETTINGS:    INTAKE / OUTPUT: I/O last 3 completed shifts: In: 3216.6 [P.O.:120; I.V.:3096.6] Out: 775 [Urine:775]  PHYSICAL EXAMINATION: General:  Chronically ill appearing, severe respiratory distress. Neuro:  Awake, confused but interactive and following commands on all 4 ext HEENT:  [place]/AT, PERRL, EOM-I and MMM Cardiovascular:  RRR, Nl S1/S2, -M/R/G. Lungs:  Diffuse crackles in all lung fields Abdomen:  Soft, NT, ND and +BS Musculoskeletal:  2+ edema and -tenderness Skin:  Intact  LABS:  BMET  Recent Labs Lab 10/13/16 0341 10/14/16 0520 10/15/16 0433  NA 131* 127* 125*  K 3.0* 3.9 4.5  CL 96* 94* 88*  CO2 23 23 22   BUN 39* 50* 55*  CREATININE 3.41* 3.98* 4.29*  GLUCOSE 92 105* 125*    Electrolytes  Recent Labs Lab 10/10/16 0349  10/10/16 1559  10/12/16 0414 10/13/16 0341 10/14/16 0520 10/15/16 0433  CALCIUM 7.8*  --   < > 8.0* 7.7* 7.5* 7.5*  MG 2.9* 2.6*  --  2.2  --   --   --   PHOS  --   --   --   --   --   --  7.3*  < > = values in this interval not displayed.  CBC  Recent Labs Lab 10/13/16 0341 10/14/16 0520 10/15/16 0433  WBC 15.3* 16.6* 15.0*  HGB 8.6* 8.5* 8.5*  HCT 25.4* 26.2* 24.9*  PLT 123* 143* 183    Coag's  Recent Labs Lab 10/09/16 1520  10/13/16 0341 10/14/16 0520 10/15/16 0433  APTT 32  --   --   --   --   INR 1.38  < > 1.15 1.20 1.23  < > = values in this interval not displayed.  Sepsis Markers No results for input(s): LATICACIDVEN, PROCALCITON, O2SATVEN in the last 168 hours.  ABG  Recent Labs Lab 10/10/16 0212 10/10/16 0354 10/14/16 0604  PHART 7.281* 7.301* 7.275*  PCO2ART 49.2* 48.1* 49.2*  PO2ART 116.0* 104.0 83.7    Liver Enzymes  Recent Labs Lab 10/14/16 0520 10/15/16 0433  AST 22  --  ALT 18  --   ALKPHOS 75  --   BILITOT 0.9  --   ALBUMIN 2.4* 2.4*    Cardiac Enzymes No results for input(s): TROPONINI, PROBNP in the last 168 hours.  Glucose  Recent Labs Lab 10/14/16 1206 10/14/16 1527 10/14/16 2044 10/14/16 2339 10/15/16 0327 10/15/16 0755  GLUCAP 176* 149* 179* 111* 134* 116*    Imaging Dg Chest Port 1 View  Result Date: 10/15/2016 CLINICAL DATA:  Congestive heart failure EXAM: PORTABLE CHEST 1 VIEW COMPARISON:  October 14, 2016 FINDINGS: Cordis tip is in the right jugular vein, stable. No pneumothorax. There is cardiomegaly with pulmonary venous hypertension. There is relatively mild interstitial edema with minimal pleural effusions bilaterally. There is midlung atelectatic change bilaterally. Patient is status post coronary artery bypass grafting. There is an atrial appendage clamp on the left. There is aortic atherosclerosis. Postoperative changes noted in the right cervical-thoracic junction region. There is a total shoulder  replacement on the left. IMPRESSION: Findings indicative of a degree of congestive heart failure. There appears to be slightly less edema compared to 1 day prior. No new opacity evident. Stable cardiac enlargement. There is aortic atherosclerosis. No pneumothorax. Aortic Atherosclerosis (ICD10-I70.0). Electronically Signed   By: Lowella Grip III M.D.   On: 10/15/2016 07:16     STUDIES:  CXR with pulmonary edema 10/3  CULTURES: None  ANTIBIOTICS: None  SIGNIFICANT EVENTS: 10/3 hypoxemic respiratory failure requiring intubation  LINES/TUBES: ETT 10/3>>> L [place] catheter 10/3>>> L radial a-line 10/3>>> R IJ single lumen catheter 10/3>>>  DISCUSSION: 83 year old male with extensive PMH with pulmonary edema, renal failure, respiratory failure needing CRRT.  ASSESSMENT / PLAN:  PULMONARY A: Acute hypoxemic respiratory failure from acute pulmonary edema P:   - Intubate - Full vent support  CARDIOVASCULAR A:  CHF Cardiogenic shock P:  - Levo - Lasix - CVTS primary  RENAL A:   Acute renal failure due to hypotension P:   - HD catheter placement - CRRT per renal - Replace electrolyte as indicated  GASTROINTESTINAL A:   No active issue P:   - TF per nutrition   HEMATOLOGIC A:   No acute disease P:  - CBC in AM - Transfuse per ICU protocol  INFECTIOUS A:   No active issues P:   - Monitor WBC and fever curve - Hold off abx.  ENDOCRINE A:   No active issues   P:   - Monitor  NEUROLOGIC A:   AMS due to respiratory failure, non-focal exam P:   RASS goal: 0 - PRN fentanyl - PRN versed   FAMILY  - Updates: Family updated bedside  - Inter-disciplinary family meet or Palliative Care meeting due by:  day 7  The patient is critically ill with multiple organ systems failure and requires high complexity decision making for assessment and support, frequent evaluation and titration of therapies, application of advanced monitoring  technologies and extensive interpretation of multiple databases.   Critical Care Time devoted to patient care services described in this note is  35  Minutes. This time reflects time of care of this signee Dr Jennet Maduro. This critical care time does not reflect procedure time, or teaching time or supervisory time of PA/NP/Med student/Med Resident etc but could involve care discussion time.  Rush Farmer, M.D. [facility] Pulmonary/Critical Care Medicine. Pager: [phone]. After hours pager: [phone].  10/15/2016, 12:10 PM

## 2016-10-15 NOTE — Progress Notes (Signed)
[facility] Rounding Note  Subjective/Interval History:  More somnolent, more SOB No improvement in creatinine or UOP (still dropping off) On neo at 75 w/systolics now over 595  Objective Vital signs in last 24 hours: Vitals:   10/15/16 0600 10/15/16 0700 10/15/16 0730 10/15/16 0800  BP: 110/76 (!) 107/57 (!) 108/57 (!) 105/55  Pulse: 82 83 82 83  Resp: 11 16 11 18   Temp:   98.3 F (36.8 C)   TempSrc:   Oral   SpO2: 98% 93% 96% 94%  Weight: 112.5 kg (248 lb)     Height:       Weight change:   Intake/Output Summary (Last 24 hours) at 10/15/16 0852 Last data filed at 10/15/16 0800  Gross per 24 hour  Intake          2062.93 ml  Output              385 ml  Net          1677.93 ml   Physical Exam:  Blood pressure (!) 105/55, pulse 83, temperature 98.3 F (36.8 C), temperature source Oral, resp. rate 18, height 5\' 8"  (1.727 m), weight 112.5 kg (248 lb), SpO2 94 %.  Gen: Obese pale appearing WM More somnolent, but awakens Anasarca Cannot see neck veins well d/t size of neck BS's markedly diminished. Audible exp wheeze Distant heart sounds  S1S2 Can't appreciate murmur Sternotomy incision dressed Abdomen distended No focal tenderness  + BS Pitting edema of arms/hands 2+ pitting LE edema Absent toe left foot/worse edema on that side Mild myoclonus persists   Recent Labs Lab 10/10/16 0349 10/10/16 1537 10/10/16 1559 10/11/16 0405 10/12/16 0414 10/13/16 0341 10/14/16 0520 10/15/16 0433  NA 140 137  --  135 134* 131* 127* 125*  K 3.4* 3.9  --  3.7 3.0* 3.0* 3.9 4.5  CL 108 103  --  101 100* 96* 94* 88*  CO2 25  --   --  25 26 23 23 22   GLUCOSE 109* 153*  --  157* 110* 92 105* 125*  BUN 31* 29*  --  30* 33* 39* 50* 55*  CREATININE 2.37* 2.40* 2.49* 2.63* 3.10* 3.41* 3.98* 4.29*  CALCIUM 7.8*  --   --  7.9* 8.0* 7.7* 7.5* 7.5*  PHOS  --   --   --   --   --   --   --  7.3*    Recent Labs Lab 10/14/16 0520 10/15/16 0433  AST 22  --   ALT 18  --   ALKPHOS 75  --    BILITOT 0.9  --   PROT 4.9*  --   ALBUMIN 2.4* 2.4*    Recent Labs Lab 10/11/16 0405 10/13/16 0341 10/14/16 0520 10/15/16 0433  WBC 15.2* 15.3* 16.6* 15.0*  HGB 9.4* 8.6* 8.5* 8.5*  HCT 28.3* 25.4* 26.2* 24.9*  MCV 87.9 88.8 88.8 88.3  PLT 99* 123* 143* 183   Lab Results  Component Value Date   INR 1.23 10/15/2016   INR 1.20 10/14/2016   INR 1.15 10/13/2016    Recent Labs Lab 10/14/16 1527 10/14/16 2044 10/14/16 2339 10/15/16 0327 10/15/16 0755  GLUCAP 149* 179* 111* 134* 116*     Iron/TIBC/Ferritin/ %Sat    Component Value Date/Time   IRON 8 (L) 10/15/2016 0433   TIBC 140 (L) 10/15/2016 0433   IRONPCTSAT 6 (L) 10/15/2016 0433   Studies/Results: Dg Chest Port 1 View  Result Date: 10/15/2016 CLINICAL DATA:  Congestive heart failure  EXAM: PORTABLE CHEST 1 VIEW COMPARISON:  October 14, 2016 FINDINGS: Cordis tip is in the right jugular vein, stable. No pneumothorax. There is cardiomegaly with pulmonary venous hypertension. There is relatively mild interstitial edema with minimal pleural effusions bilaterally. There is midlung atelectatic change bilaterally. Patient is status post coronary artery bypass grafting. There is an atrial appendage clamp on the left. There is aortic atherosclerosis. Postoperative changes noted in the right cervical-thoracic junction region. There is a total shoulder replacement on the left. IMPRESSION: Findings indicative of a degree of congestive heart failure. There appears to be slightly less edema compared to 1 day prior. No new opacity evident. Stable cardiac enlargement. There is aortic atherosclerosis. No pneumothorax. Aortic Atherosclerosis (ICD10-I70.0). Electronically Signed   By: Lowella Grip III M.D.   On: 10/15/2016 07:16   Dg Chest Port 1 View  Result Date: 10/14/2016 CLINICAL DATA:  Wheezing R06.2 (ICD-10-CM) EXAM: PORTABLE CHEST 1 VIEW COMPARISON:  10/13/2016. FINDINGS: Cardiomegaly, with sequelae of previous CABG. Worsening  aeration, with pulmonary edema and bibasilar atelectasis as well as effusions. No pneumothorax. Consolidation cannot be excluded in the dense retrocardiac region. Swan-Ganz introducer unchanged. IMPRESSION: Worsening aeration.  Increasing pulmonary edema. Electronically Signed   By: Staci Righter M.D.   On: 10/14/2016 07:18   Medications: . sodium chloride    . sodium chloride    . amiodarone 30 mg/hr (10/15/16 0700)  . DOPamine 3 mcg/kg/min (10/15/16 0700)  . furosemide (LASIX) infusion 15 mg/hr (10/15/16 0700)  . lactated ringers    . lactated ringers Stopped (10/09/16 1708)  . lactated ringers Stopped (10/15/16 0700)  . phenylephrine (NEO-SYNEPHRINE) Adult infusion 75 mcg/min (10/15/16 0751)   . aspirin EC  325 mg Oral Daily  . atorvastatin  40 mg Oral q1800  . bisacodyl  10 mg Oral Daily   Or  . bisacodyl  10 mg Rectal Daily  . chlorhexidine  15 mL Mouth Rinse BID  . darbepoetin (ARANESP) injection - NON-DIALYSIS  150 mcg Subcutaneous Once  . docusate sodium  200 mg Oral Daily  . enoxaparin (LOVENOX) injection  30 mg Subcutaneous QHS  . ferrous sulfate  325 mg Oral Q breakfast  . finasteride  5 mg Oral QHS  . folic acid  1 mg Oral Daily  . guaiFENesin  1,200 mg Oral BID  . insulin aspart  0-24 Units Subcutaneous Q4H  . insulin detemir  20 Units Subcutaneous Daily  . levothyroxine  25 mcg Oral QAC breakfast  . mouth rinse  15 mL Mouth Rinse q12n4p  . metoprolol tartrate  25 mg Oral BID  . moving right along book   Does not apply Once  . pantoprazole  40 mg Oral Daily  . patient's guide to using coumadin book   Does not apply Once  . sodium chloride flush  10-40 mL Intracatheter Q12H  . sodium chloride flush  3 mL Intravenous Q12H  . sodium chloride flush  3 mL Intravenous Q12H  . tamsulosin  0.4 mg Oral QHS  . warfarin  5 mg Oral q1800  . Warfarin - Physician Dosing Inpatient   Does not apply q1800    Background: 83 y.o. year-old with PMH DM, HTN, HLD, chronic dCHF,  COPD, PAF, RA, secondary HPT. Known CKD3/4 (DM, HTN, prior NSAID use; followed by Dr. Marval Regal). Last creatinine in our office 2.61 08/28/16. Had heart cath 10/07/16 showing 90% LM ds. Had 2V CABG, MAZE procedure, clipping of atrial appendage by Dr. Roxy Manns  10/09/16. Has had insidious worsening  of renal function since that time with creatinine today up to 3.98. UOP has trailed off past 48 hours from 1.5-2L range, 730 cc yesterday, only 255 cc today. On lasix drip since 9/28. UOP trailing off. Weight about 5-6 kg over pre op weight with CXR showing worsening pulmonary edema. Blood pressures low side with many systolics in 49-44 range. We were asked to see.   Assessment/Recommendations  1. AKI on CKD3/4 - post CABG and MAZE procedure. Steady rise in creatinine, UOP dropped off despite lasix drip. I believe hemodynamically driven (low blood pressures) in kidneys with little reserve. No appreciable response to bolus dose 160 mg of lasix earlier on top of furosemide drip. No renal improvement with addition of neo though BP up some 1. CCM to place line 2. CRRT for fluid/azotemia - neg 50-100/hour as BP allows 2. Anasarca/volume overload  1. Stop lasix when CRRT starts 3. S/P CABG/MAZE procedure - low BP's post. ECHO 10/2 no pericardial effusion. 1. Decision about resumption of inotropes per Dr. Roxy Manns 4. Anemia - ABLA. Has not had appreciable CKD related anemia and has not required ESA's in the past 1. Transfuse for Hb <7 2. Dosed Aranesp 150 10/2. TSat quite low. Feraheme. 5. Diabetes with neuropathy - on mod dose of gabapentin. Some mild myoclonus yesterday.Holding gabapentin for now as may be seeing early side effects from drug accumulation 6. Hyponatremia - fluid overload. Should correct with CRRT   Jamal Maes, MD [place] pager 10/15/2016, 8:52 AM

## 2016-10-16 ENCOUNTER — Inpatient Hospital Stay (HOSPITAL_COMMUNITY): Payer: Medicare Other

## 2016-10-16 DIAGNOSIS — J9383 Other pneumothorax: Secondary | ICD-10-CM

## 2016-10-16 DIAGNOSIS — R570 Cardiogenic shock: Secondary | ICD-10-CM

## 2016-10-16 DIAGNOSIS — J80 Acute respiratory distress syndrome: Secondary | ICD-10-CM

## 2016-10-16 DIAGNOSIS — Z951 Presence of aortocoronary bypass graft: Secondary | ICD-10-CM

## 2016-10-16 LAB — BLOOD GAS, ARTERIAL
ACID-BASE DEFICIT: 4.3 mmol/L — AB (ref 0.0–2.0)
BICARBONATE: 21.2 mmol/L (ref 20.0–28.0)
Drawn by: 252031
FIO2: 100
LHR: 16 {breaths}/min
MECHVT: 680 mL
O2 Saturation: 90.5 %
PEEP: 5 cmH2O
PO2 ART: 64.3 mmHg — AB (ref 83.0–108.0)
Patient temperature: 98.6
pCO2 arterial: 44.9 mmHg (ref 32.0–48.0)
pH, Arterial: 7.295 — ABNORMAL LOW (ref 7.350–7.450)

## 2016-10-16 LAB — RENAL FUNCTION PANEL
ALBUMIN: 2.1 g/dL — AB (ref 3.5–5.0)
ANION GAP: 12 (ref 5–15)
Albumin: 2.1 g/dL — ABNORMAL LOW (ref 3.5–5.0)
Anion gap: 16 — ABNORMAL HIGH (ref 5–15)
BUN: 44 mg/dL — ABNORMAL HIGH (ref 6–20)
BUN: 35 mg/dL — AB (ref 6–20)
CALCIUM: 7 mg/dL — AB (ref 8.9–10.3)
CALCIUM: 7.3 mg/dL — AB (ref 8.9–10.3)
CO2: 21 mmol/L — ABNORMAL LOW (ref 22–32)
CO2: 18 mmol/L — ABNORMAL LOW (ref 22–32)
CREATININE: 3.34 mg/dL — AB (ref 0.61–1.24)
CREATININE: 2.95 mg/dL — AB (ref 0.61–1.24)
Chloride: 96 mmol/L — ABNORMAL LOW (ref 101–111)
Chloride: 97 mmol/L — ABNORMAL LOW (ref 101–111)
GFR calc Af Amer: 21 mL/min — ABNORMAL LOW (ref >60)
GFR, EST AFRICAN AMERICAN: 18 mL/min — AB (ref >60)
GFR, EST NON AFRICAN AMERICAN: 16 mL/min — AB (ref >60)
GFR, EST NON AFRICAN AMERICAN: 18 mL/min — AB (ref >60)
GLUCOSE: 126 mg/dL — AB (ref 65–99)
Glucose, Bld: 81 mg/dL (ref 65–99)
PHOSPHORUS: 4.7 mg/dL — AB (ref 2.5–4.6)
PHOSPHORUS: 5.2 mg/dL — AB (ref 2.5–4.6)
Potassium: 4.1 mmol/L (ref 3.5–5.1)
Potassium: 4.9 mmol/L (ref 3.5–5.1)
SODIUM: 129 mmol/L — AB (ref 135–145)
SODIUM: 131 mmol/L — AB (ref 135–145)

## 2016-10-16 LAB — POCT ACTIVATED CLOTTING TIME
ACTIVATED CLOTTING TIME: 191 s
ACTIVATED CLOTTING TIME: 197 s
ACTIVATED CLOTTING TIME: 202 s
ACTIVATED CLOTTING TIME: 202 s
ACTIVATED CLOTTING TIME: 252 s
ACTIVATED CLOTTING TIME: 274 s
Activated Clotting Time: 180 seconds
Activated Clotting Time: 180 seconds
Activated Clotting Time: 191 seconds
Activated Clotting Time: 191 seconds
Activated Clotting Time: 208 seconds

## 2016-10-16 LAB — MAGNESIUM
MAGNESIUM: 2.2 mg/dL (ref 1.7–2.4)
Magnesium: 2.5 mg/dL — ABNORMAL HIGH (ref 1.7–2.4)

## 2016-10-16 LAB — CBC
HEMATOCRIT: 27.6 % — AB (ref 39.0–52.0)
HEMATOCRIT: 29.6 % — AB (ref 39.0–52.0)
HEMOGLOBIN: 9.9 g/dL — AB (ref 13.0–17.0)
Hemoglobin: 9.2 g/dL — ABNORMAL LOW (ref 13.0–17.0)
MCH: 29.1 pg (ref 26.0–34.0)
MCH: 29.3 pg (ref 26.0–34.0)
MCHC: 33.3 g/dL (ref 30.0–36.0)
MCHC: 33.4 g/dL (ref 30.0–36.0)
MCV: 87.3 fL (ref 78.0–100.0)
MCV: 87.6 fL (ref 78.0–100.0)
PLATELETS: 225 10*3/uL (ref 150–400)
Platelets: 208 10*3/uL (ref 150–400)
RBC: 3.16 MIL/uL — ABNORMAL LOW (ref 4.22–5.81)
RBC: 3.38 MIL/uL — ABNORMAL LOW (ref 4.22–5.81)
RDW: 14.5 % (ref 11.5–15.5)
RDW: 14.8 % (ref 11.5–15.5)
WBC: 18.8 10*3/uL — AB (ref 4.0–10.5)
WBC: 19.1 10*3/uL — ABNORMAL HIGH (ref 4.0–10.5)

## 2016-10-16 LAB — LACTIC ACID, PLASMA: LACTIC ACID, VENOUS: 4.6 mmol/L — AB (ref 0.5–1.9)

## 2016-10-16 LAB — POCT I-STAT 3, ART BLOOD GAS (G3+)
ACID-BASE DEFICIT: 6 mmol/L — AB (ref 0.0–2.0)
Acid-base deficit: 8 mmol/L — ABNORMAL HIGH (ref 0.0–2.0)
BICARBONATE: 20.2 mmol/L (ref 20.0–28.0)
Bicarbonate: 18.4 mmol/L — ABNORMAL LOW (ref 20.0–28.0)
O2 SAT: 91 %
O2 Saturation: 87 %
PCO2 ART: 40.3 mmHg (ref 32.0–48.0)
PCO2 ART: 36.9 mmHg (ref 32.0–48.0)
PH ART: 7.261 — AB (ref 7.350–7.450)
PO2 ART: 59 mmHg — AB (ref 83.0–108.0)
Patient temperature: 35.9
Patient temperature: 35.5
TCO2: 20 mmol/L — ABNORMAL LOW (ref 22–32)
TCO2: 21 mmol/L — AB (ref 22–32)
pH, Arterial: 7.338 — ABNORMAL LOW (ref 7.350–7.450)
pO2, Arterial: 57 mmHg — ABNORMAL LOW (ref 83.0–108.0)

## 2016-10-16 LAB — GLUCOSE, CAPILLARY
GLUCOSE-CAPILLARY: 84 mg/dL (ref 65–99)
GLUCOSE-CAPILLARY: 82 mg/dL (ref 65–99)
GLUCOSE-CAPILLARY: 94 mg/dL (ref 65–99)
GLUCOSE-CAPILLARY: 150 mg/dL — AB (ref 65–99)
Glucose-Capillary: 201 mg/dL — ABNORMAL HIGH (ref 65–99)
Glucose-Capillary: 181 mg/dL — ABNORMAL HIGH (ref 65–99)

## 2016-10-16 LAB — COMPREHENSIVE METABOLIC PANEL
ALBUMIN: 2.1 g/dL — AB (ref 3.5–5.0)
ALK PHOS: 170 U/L — AB (ref 38–126)
ALT: 21 U/L (ref 17–63)
AST: 43 U/L — ABNORMAL HIGH (ref 15–41)
Anion gap: 16 — ABNORMAL HIGH (ref 5–15)
BILIRUBIN TOTAL: 1.2 mg/dL (ref 0.3–1.2)
BUN: 35 mg/dL — AB (ref 6–20)
CHLORIDE: 96 mmol/L — AB (ref 101–111)
CO2: 18 mmol/L — ABNORMAL LOW (ref 22–32)
Calcium: 7.3 mg/dL — ABNORMAL LOW (ref 8.9–10.3)
Creatinine, Ser: 2.98 mg/dL — ABNORMAL HIGH (ref 0.61–1.24)
GFR calc Af Amer: 21 mL/min — ABNORMAL LOW (ref >60)
GFR, EST NON AFRICAN AMERICAN: 18 mL/min — AB (ref >60)
GLUCOSE: 126 mg/dL — AB (ref 65–99)
POTASSIUM: 4.9 mmol/L (ref 3.5–5.1)
Sodium: 130 mmol/L — ABNORMAL LOW (ref 135–145)
Total Protein: 5.1 g/dL — ABNORMAL LOW (ref 6.5–8.1)

## 2016-10-16 LAB — COOXEMETRY PANEL
CARBOXYHEMOGLOBIN: 1 % (ref 0.5–1.5)
METHEMOGLOBIN: 1 % (ref 0.0–1.5)
O2 Saturation: 64.9 %
Total hemoglobin: 13.7 g/dL (ref 12.0–16.0)

## 2016-10-16 LAB — APTT: APTT: 132 s — AB (ref 24–36)

## 2016-10-16 LAB — PHOSPHORUS: Phosphorus: 5.2 mg/dL — ABNORMAL HIGH (ref 2.5–4.6)

## 2016-10-16 LAB — LIPASE, BLOOD: Lipase: 23 U/L (ref 11–51)

## 2016-10-16 LAB — PROTIME-INR
INR: 1.94
Prothrombin Time: 22 seconds — ABNORMAL HIGH (ref 11.4–15.2)

## 2016-10-16 LAB — AMYLASE: Amylase: 25 U/L — ABNORMAL LOW (ref 28–100)

## 2016-10-16 MED ORDER — FENTANYL 2500MCG IN NS 250ML (10MCG/ML) PREMIX INFUSION
25.0000 ug/h | INTRAVENOUS | Status: DC
  Administered 2016-10-16 – 2016-10-17 (×2): 25 ug/h via INTRAVENOUS
  Administered 2016-10-19: 150 ug/h via INTRAVENOUS
  Administered 2016-10-20: 75 ug/h via INTRAVENOUS
  Filled 2016-10-16 (×5): qty 250

## 2016-10-16 MED ORDER — CHLORHEXIDINE GLUCONATE CLOTH 2 % EX PADS
6.0000 | MEDICATED_PAD | Freq: Every day | CUTANEOUS | Status: DC
  Administered 2016-10-17 – 2016-10-29 (×12): 6 via TOPICAL

## 2016-10-16 MED ORDER — HEPARIN (PORCINE) IN NACL 100-0.45 UNIT/ML-% IJ SOLN: 1300.0000 [IU]/h | INTRAMUSCULAR | Status: DC

## 2016-10-16 MED ORDER — HEPARIN (PORCINE) IN NACL 100-0.45 UNIT/ML-% IJ SOLN
1300.0000 [IU]/h | INTRAMUSCULAR | Status: DC
  Administered 2016-10-16: 1300 [IU]/h via INTRAVENOUS
  Filled 2016-10-16: qty 250

## 2016-10-16 MED ORDER — VANCOMYCIN HCL IN DEXTROSE 1-5 GM/200ML-% IV SOLN
1000.0000 mg | INTRAVENOUS | Status: DC
  Administered 2016-10-17 – 2016-10-19 (×3): 1000 mg via INTRAVENOUS
  Filled 2016-10-16 (×3): qty 200

## 2016-10-16 MED ORDER — PANTOPRAZOLE SODIUM 40 MG PO PACK
40.0000 mg | PACK | Freq: Every day | ORAL | Status: DC
  Administered 2016-10-16 – 2016-10-17 (×2): 40 mg
  Filled 2016-10-16 (×2): qty 20

## 2016-10-16 MED ORDER — VITAL HIGH PROTEIN PO LIQD
1000.0000 mL | ORAL | Status: DC
  Administered 2016-10-16: 1000 mL

## 2016-10-16 MED ORDER — FENTANYL BOLUS VIA INFUSION
25.0000 ug | INTRAVENOUS | Status: DC | PRN
  Administered 2016-10-16 – 2016-10-19 (×11): 25 ug via INTRAVENOUS
  Filled 2016-10-16: qty 25

## 2016-10-16 MED ORDER — SODIUM BICARBONATE 8.4 % IV SOLN
INTRAVENOUS | Status: AC
  Administered 2016-10-16: 50 meq
  Filled 2016-10-16: qty 50

## 2016-10-16 MED ORDER — VANCOMYCIN HCL 10 G IV SOLR
1750.0000 mg | Freq: Once | INTRAVENOUS | Status: AC
  Administered 2016-10-16: 1750 mg via INTRAVENOUS
  Filled 2016-10-16: qty 1750

## 2016-10-16 MED ORDER — VASOPRESSIN 20 UNIT/ML IV SOLN
0.0300 [IU]/min | INTRAVENOUS | Status: DC
  Administered 2016-10-16 – 2016-10-19 (×4): 0.03 [IU]/min via INTRAVENOUS
  Filled 2016-10-16 (×3): qty 2

## 2016-10-16 MED ORDER — DOPAMINE-DEXTROSE 3.2-5 MG/ML-% IV SOLN
0.0000 ug/kg/min | INTRAVENOUS | Status: DC
  Administered 2016-10-17 – 2016-10-20 (×3): 3 ug/kg/min via INTRAVENOUS
  Filled 2016-10-16 (×3): qty 250

## 2016-10-16 MED ORDER — DEXTROSE 5 % IV SOLN
INTRAVENOUS | Status: DC
  Administered 2016-10-16 – 2016-10-18 (×6): via INTRAVENOUS
  Filled 2016-10-16 (×13): qty 150

## 2016-10-16 MED ORDER — PIPERACILLIN-TAZOBACTAM 3.375 G IVPB
3.3750 g | Freq: Four times a day (QID) | INTRAVENOUS | Status: DC
  Administered 2016-10-16 – 2016-10-19 (×12): 3.375 g via INTRAVENOUS
  Filled 2016-10-16 (×15): qty 50

## 2016-10-16 NOTE — Progress Notes (Signed)
Called to bedside by RN for worsening hypotension despite increasing pressors, progressive hypoxia despite 100% FiO2 and increasing peep.    Pt dusky, somnolent, does open eyes to voice  resps even, slightly labored, +bilat breath sounds  abd more distended, hypoactive BS  Anasarca    Recent Labs Lab 10/14/16 0520 10/15/16 0433 10/16/16 0306  HGB 8.5* 8.5* 9.2*  HCT 26.2* 24.9* 27.6*  WBC 16.6* 15.0* 18.8*  PLT 143* 183 225    Recent Labs Lab 10/10/16 0349  10/10/16 1559  10/12/16 0414 10/13/16 0341 10/14/16 0520 10/15/16 0433 10/15/16 1600 10/15/16 1610 10/16/16 0306  NA 140  < >  --   < > 134* 131* 127* 125* 126*  --  129*  K 3.4*  < >  --   < > 3.0* 3.0* 3.9 4.5 4.2  --  4.1  CL 108  < >  --   < > 100* 96* 94* 88* 92*  --  96*  CO2 25  --   --   < > 26 23 23 22  19*  --  21*  GLUCOSE 109*  < >  --   < > 110* 92 105* 125* 144*  --  81  BUN 31*  < >  --   < > 33* 39* 50* 55* 54*  --  44*  CREATININE 2.37*  < > 2.49*  < > 3.10* 3.41* 3.98* 4.29* 4.09*  --  3.34*  CALCIUM 7.8*  --   --   < > 8.0* 7.7* 7.5* 7.5* 7.1*  --  7.0*  MG 2.9*  --  2.6*  --  2.2  --   --   --   --  2.2 2.2  PHOS  --   --   --   --   --   --   --  7.3* 7.1* 7.0* 4.7*  < > = values in this interval not displayed.  PLAN -  Vasopressin added.  Broad spectrum abx added.  TF stopped.  ABG with PO2 60's on 606% FiO2 and metabolic acidosis. HCO3 pushed and HCO3 gtt ordered.  Cbc, cmet, lactate, amylase, lipase ordered  Stat bedside CXR ultimately revealed mod R ptx.   Dr. Nelda Marseille at bedside for chest tube placement.  Repeat ABG - may be able to hold off on HCO3 gtt CVTS notified.  Discussed with family.   Nickolas Madrid, NP 10/16/2016  3:46 PM Pager: [phone] or [phone]  Rush Farmer, M.D. [facility] Pulmonary/Critical Care Medicine. Pager: [phone]. After hours pager: [phone].

## 2016-10-16 NOTE — Progress Notes (Signed)
Patient ID: [name], male   DOB: Sep 28, 1930, 83 y.o.   MRN: [id number] EVENING ROUNDS NOTE :     [address]       [place] [address]             [phone]                 7 Days Post-Op Procedure(s) (LRB): CORONARY ARTERY BYPASS GRAFTING (CABG) x two, using left internal mammary artery and right leg greater saphenous vein harvested endoscopically (N/A) MAZE (N/A) TRANSESOPHAGEAL ECHOCARDIOGRAM (TEE) (N/A) CLIPPING OF ATRIAL APPENDAGE  Total Length of Stay:  LOS: 7 days  BP (!) 99/44   Pulse 99   Temp (!) 96.1 F (35.6 C)   Resp (!) 23   Ht 5\' 8"  (1.727 m)   Wt 249 lb 1.9 oz (113 kg)   SpO2 97%   BMI 37.88 kg/m   .Intake/Output      10/04 0701 - 10/05 0700   I.V. (mL/kg) 1576.8 (14)   NG/GT 100   IV Piggyback 50   Total Intake(mL/kg) 1726.8 (15.3)   Urine (mL/kg/hr) 145 (0.1)   Emesis/NG output 0   Other 1673   Chest Tube 500   Total Output 2318   Net -591.2         . sodium chloride    . sodium chloride    . amiodarone 30 mg/hr (10/16/16 0700)  . DOPamine 3 mcg/kg/min (10/16/16 0830)  . fentaNYL infusion INTRAVENOUS 25 mcg/hr (10/16/16 1700)  . ferumoxytol Stopped (10/15/16 1407)  . heparin 10,000 units/ 20 mL infusion syringe 1,350 Units/hr (10/16/16 1900)  . heparin 1,300 Units/hr (10/16/16 1905)  . heparin    . lactated ringers    . lactated ringers Stopped (10/09/16 1708)  . lactated ringers Stopped (10/15/16 0700)  . phenylephrine (NEO-SYNEPHRINE) Adult infusion 180 mcg/min (10/16/16 1730)  . piperacillin-tazobactam (ZOSYN)  IV 3.375 g (10/16/16 1829)  . dialysis replacement fluid (prismasate) 300 mL/hr at 10/16/16 0620  . dialysis replacement fluid (prismasate) 200 mL/hr at 10/15/16 1330  . dialysate (PRISMASATE) 1,000 mL/hr at 10/16/16 1400  .  sodium bicarbonate  infusion 1000 mL 150 mL/hr at 10/16/16 1530  . vancomycin 1,750 mg (10/16/16 1808)  . [START ON 10/17/2016] vancomycin    . vasopressin (PITRESSIN) infusion - *FOR  SHOCK* 0.03 Units/min (10/16/16 1545)     Lab Results  Component Value Date   WBC 19.1 (H) 10/16/2016   HGB 9.9 (L) 10/16/2016   HCT 29.6 (L) 10/16/2016   PLT 208 10/16/2016   GLUCOSE 126 (H) 10/16/2016   GLUCOSE 126 (H) 10/16/2016   CHOL 179 08/20/2014   TRIG 105 08/20/2014   HDL 30 (L) 08/20/2014   LDLCALC 128 (H) 08/20/2014   ALT 21 10/16/2016   AST 43 (H) 10/16/2016   NA 131 (L) 10/16/2016   NA 130 (L) 10/16/2016   K 4.9 10/16/2016   K 4.9 10/16/2016   CL 97 (L) 10/16/2016   CL 96 (L) 10/16/2016   CREATININE 2.95 (H) 10/16/2016   CREATININE 2.98 (H) 10/16/2016   BUN 35 (H) 10/16/2016   BUN 35 (H) 10/16/2016   CO2 18 (L) 10/16/2016   CO2 18 (L) 10/16/2016   TSH 6.623 (H) 10/01/2016   INR 1.94 10/16/2016   HGBA1C 7.2 (H) 10/06/2016   Now critically ill on cvvh , vent and new right chest tube for ptx   Grace Isaac MD  Beeper [phone] Office [phone] 10/16/2016 7:32  PM   

## 2016-10-16 NOTE — Progress Notes (Signed)
Initial Nutrition Assessment  DOCUMENTATION CODES:   Obesity unspecified  INTERVENTION:   As able recommend:   Increase Vital High Protein to 30 ml/hr 60 ml Prostat TID Provides: 1320 kcal, 153 grams protein, and 601 ml free water.    NUTRITION DIAGNOSIS:   Increased nutrient needs related to acute illness (MSOF) as evidenced by estimated needs.  GOAL:   Provide needs based on ASPEN/SCCM guidelines  MONITOR:   I & O's, TF tolerance, Skin, Weight trends  REASON FOR ASSESSMENT:   Consult, Ventilator Enteral/tube feeding initiation and management  ASSESSMENT:   Pt with PMH of HTN, CHF, stage IV CKD, hyperlipidemia, afib, COPD, DM admitted for CABG x 2 and MAZE. Post op pt had renal and respiratory failure required intubation and CRRT 10/3.    Admission weight 218 lb now 249 lb, pt is 4.5 L positive  Patient is currently intubated on ventilator support TF ordered 10/3 but held due to ileus. Vital High Protein started 10/4 @ 20 ml/hr (trickle)  Medications reviewed and include: dulcolax, colace, folic acid, novolog 6-29 units every 4 hours, synthroid, loevophed @ 46.9 MAP 65-72-61 Labs reviewed: Na 129 (L), PO4 4.7 (H) CBG's: 197-82-202  Diet Order:     Skin:  Reviewed, no issues  Last BM:  10/4  Height:   Ht Readings from Last 1 Encounters:  10/10/16 5\' 8"  (1.727 m)    Weight:   Wt Readings from Last 1 Encounters:  10/16/16 249 lb 1.9 oz (113 kg)  Admission weight 218 l b(99 kg)   Ideal Body Weight:  70 kg  BMI:  Body mass index is 37.88 kg/m.  Estimated Nutritional Needs:   Kcal:  5284-1324  Protein:  >/= 140 grams  Fluid:  > 1.5 L/day  EDUCATION NEEDS:   No education needs identified at this time  Diggins, [place], [place] Pager [phone] After Hours Pager

## 2016-10-16 NOTE — Progress Notes (Signed)
[address]       [place] [id number]             [phone]        CARDIOTHORACIC SURGERY PROGRESS NOTE   R7 Days Post-Op Procedure(s) (LRB): CORONARY ARTERY BYPASS GRAFTING (CABG) x two, using left internal mammary artery and right leg greater saphenous vein harvested endoscopically (N/A) MAZE (N/A) TRANSESOPHAGEAL ECHOCARDIOGRAM (TEE) (N/A) CLIPPING OF ATRIAL APPENDAGE  Subjective: On ventilator this morning, arouses to verbal stimuli.   Objective: Vital signs: BP Readings from Last 1 Encounters:  10/16/16 100/64   Pulse Readings from Last 1 Encounters:  10/16/16 93   Resp Readings from Last 1 Encounters:  10/16/16 17   Temp Readings from Last 1 Encounters:  10/16/16 98.1 F (36.7 C)    Hemodynamics:    Physical Exam:  Rhythm:   NSR at 97  Breath sounds: Coarse rhonci bilaterally, on vent  Heart sounds:  RRR, no m/r/g  Incisions:  Median sternotomy incision is clean, dry, intact  Abdomen:  Soft, slightly distended  Extremities:  Gross edema to upper and lower extremities bilaterally   Intake/Output from previous day: 10/03 0701 - 10/04 0700 In: 2501.6 [I.V.:2314.6; NG/GT:70; IV Piggyback:117] Out: 2890 [Urine:320] Intake/Output this shift: No intake/output data recorded.  Lab Results:  CBC: Recent Labs  10/15/16 0433 10/16/16 0306  WBC 15.0* 18.8*  HGB 8.5* 9.2*  HCT 24.9* 27.6*  PLT 183 225    BMET:  Recent Labs  10/15/16 1600 10/16/16 0306  NA 126* 129*  K 4.2 4.1  CL 92* 96*  CO2 19* 21*  GLUCOSE 144* 81  BUN 54* 44*  CREATININE 4.09* 3.34*  CALCIUM 7.1* 7.0*     PT/INR:   Recent Labs  10/16/16 0306  LABPROT 22.0*  INR 1.94    CBG (last 3)   Recent Labs  10/15/16 2004 10/15/16 2310 10/16/16 0353  GLUCAP 120* 88 84    ABG    Component Value Date/Time   PHART 7.295 (L) 10/16/2016 0335   PCO2ART 44.9 10/16/2016 0335   PO2ART 64.3 (L) 10/16/2016 0335   HCO3 21.2 10/16/2016 0335   TCO2  23 10/10/2016 1537   ACIDBASEDEF 4.3 (H) 10/16/2016 0335   O2SAT 64.9 10/16/2016 0340    CXR: PORTABLE CHEST 1 VIEW  COMPARISON:  Chest x-ray 10/15/2016.  FINDINGS: An endotracheal tube is in place with tip 5.2 cm above the carina. A nasogastric tube is seen extending into the stomach, however, the tip of the nasogastric tube extends below the lower margin of the image. There is a right-sided internal jugular central venous catheter with tip terminating in the internal jugular vein. There is a left-sided subclavian Vas-Cath with tip terminating in the superior cavoatrial junction. Lung volumes are low. Worsening patchy multifocal interstitial and airspace disease throughout the lungs bilaterally concerning for worsening pulmonary edema. Small bilateral pleural effusions (right greater than left). Mild cardiomegaly. The patient is rotated to the right on today's exam, resulting in distortion of the mediastinal contours and reduced diagnostic sensitivity and specificity for mediastinal pathology. Status post median sternotomy for CABG. Left atrial appendage ligation clip also noted.  IMPRESSION: 1. Support apparatus and postoperative changes, as above. 2. The appearance the chest suggests worsening congestive heart failure, as above.   Electronically Signed   By: Vinnie Langton M.D.   On: 10/16/2016 07:45  Assessment/Plan: S/P Procedure(s) (LRB): CORONARY ARTERY BYPASS GRAFTING (CABG) x two, using left internal mammary artery  and right leg greater saphenous vein harvested endoscopically (N/A) MAZE (N/A) TRANSESOPHAGEAL ECHOCARDIOGRAM (TEE) (N/A) CLIPPING OF ATRIAL APPENDAGE  Cardiovascular - Hemodynamically stable. Currently in NSR Rhythm at 81 bpmon 30 mg/hr Amiodarone. BP 107/75on 0.3 mcg/Hr of Dopamine and on 125 mcg/min of phenylephrine   INR - INR this morning is 1.94 on 5mg  Warfarin. Continue to monitor INR.   Pulmonary -SpO2 97% on on ventilation, which  he required yesterday for HD line placement. Still has bilateral crackles on examination. CXR this morning shows worseningcongestive heart failure. He will likely require ventilation for a few days while receiving CRRT  Anemia- Expected post-operative. H&H 9.2and 27.6. Continue ferrous sulfate and monitor.   Fluid Overload- Expected post-operative. Patient is now 12 kg above pre-operative weight. I/O -388.4in the last 24hours. No longer on Lasix drip. Currently receiving CRRT.   Renal Impairement -sCr improved to 3.34 this morning. Currently on CRRT pulling about 80 an hour.  Nephrology following  Hypokalemia -K+ 4.1this morning. Supplementation ordered, Will need to closely monitor with renal impairment   Post-operative Ileus - Per nursing staff, he was able to have a small BM yesterday. KUB showed distended loops of small bowel. Abdomen feels softer and less distended on examination this morning. Will continue to monitor this.   CBG -120/88/84 - Continue ACHS glucose checks and SSI PRN.   Continue Dopamine, amiodarone, wean neo as tolerated Continue CRRT Will likely need to stay on ventilator for a few days while on CRRT Monitor INR Monitor H&H Monitor K+ and supplement as needed  Edison Simon, PA-S 10/16/2016 7:50 AM  I have seen and examined the patient and agree with the assessment and plan as outlined.    Clinically stable since intubation and initiation of CRRT for acute on chronic hypoxemic respiratory failure and acute renal failure.  Remains in sinus rhythm w/ stable BP on dopamine @ 3 and somewhat lower dose Neo for BP support.  Oxygenation stable but still on 100% FiO2 and CXR looks worse, c/w CHF +/- ARDS.  Tolerating volume removal 50-100 mL/hr.  WBC up today but no fevers, copious thick airway secretions.  May need to add empiric ABx for possible HCAP.  Check sputum culture.  Bowel sounds remain hypoactive but abdomen somewhat less distended and patient  reportedly had small BM overnight. Probably should continue to hold off on tube feeds for now but start trickle feeds soon if abdominal exam remains stable/improves.  Recheck KUB tomorrow f/u possible ileus.  Rexene Alberts, MD 10/16/2016 8:37 AM

## 2016-10-16 NOTE — Progress Notes (Signed)
RN observed pt's BP trending down requiring up titration of Neo. SpO2 dropping to mid 80% despite FiO2 set to 100%. Lungs auscultated. Rhonchi heard over left lung fields. Diminished to no lung sounds heard over right lung fields. RT notified. Josefina Do NP rounded on unit and asked to assess. ABG and STAT CXR obtained. ABG 7.2/40.3/57/18.4. Dr. Nelda Marseille paged and at the bedside. Bicarb drip started after one amp given. Vasopressin added for pressure support. CXR showed R pneumothorax. Pt prepped for chest tube placement. Dr. Roxy Manns paged to update. Dr. Prescott Gum to answer page while rounding on floor, and agreed with Dr. Nelda Marseille to place tube. Vent changed made per CCM with RT.   Pt's abdomen became more distended as events progressed. Abd xray obtained and stat CT ordered. Pt transported to CT once hypoxia improved and BP stabilized.   CRRT machine paused for CT transport. During events patient fluid removal changed to even to help with hemodynamic instability. Restarted with new filter at ~1800

## 2016-10-16 NOTE — Progress Notes (Signed)
[facility] Rounding Note  Subjective/Interval History:  Pt intubated 10/3, CRRT initiated No technical issues 300/200/1000/heparin/all 4K fluids UFR net neg 50-100+/hour On pressors  Objective Vital signs in last 24 hours: Vitals:   10/16/16 0645 10/16/16 0700 10/16/16 0800 10/16/16 0804  BP:  100/64 (!) 79/68 (!) 126/51  Pulse: 93 93 98   Resp: _0 Temp: 98.1 F (36.7 C) 98.1 F (36.7 C) 98.2 F (36.8 C)   TempSrc:      SpO2: 98% 98% 97%   Weight:   113 kg (249 lb 1.9 oz)   Height:       Weight change: 0.408 kg (14.4 oz)  Intake/Output Summary (Last 24 hours) at 10/16/16 0939 Last data filed at 10/16/16 0900  Gross per 24 hour  Intake          2522.64 ml  Output             3268 ml  Net          -745.36 ml   Weight summary Date/Time  Weight                       10/16/16 0800  113 kg (249 lb 1.9 oz)   10/15/16 0600  112.5 kg (248 lb) CRRT initiated  10/13/16 0600  104.7 kg (230 lb 13.2 oz)   10/12/16 0500  105.4 kg (232 lb 6.4 oz)   10/11/16 0500  108.1 kg (238 lb 4.8 oz)   10/10/16 0500  106.9 kg (235 lb 10.8 oz)   10/09/16 0619  98.9 kg (218 lb)   10/09/16 0545  98.9 kg (218 lb)      Physical Exam:  Blood pressure (!) 126/51, pulse 98, temperature 98.2 F (36.8 C), resp. rate 20, height _1  (1.727 m), weight 113 kg (249 lb 1.9 oz), SpO2 97 %.  Gen: Obese pale appearing WM On vent, awakens, nods R IJ cordis just removed New left IJ trialysis catheter (10/3 CCM) Cannot see neck veins at all d/t lines, etc BS's markedly diminished. Distant heart sounds  S1S2 NO S3 Can't appreciate murmur Sternotomy incision dressed Abdomen distended No focal tenderness  + BS Massive pitting edema of arms/hands 2+ pitting LE edema Absent toe left foot/worse edema on that side   Recent Labs Lab 10/11/16 0405 10/12/16 0414 10/13/16 0341 10/14/16 0520 10/15/16 0433 10/15/16 1600 10/15/16 1610 10/16/16 0306  NA 135 134* 131* 127* 125* 126*  --  129*  K  3.7 3.0* 3.0* 3.9 4.5 4.2  --  4.1  CL 101 100* 96* 94* 88* 92*  --  96*  CO2 _2 19*  --  21*  GLUCOSE 157* 110* 92 105* 125* 144*  --  81  BUN 30* 33* 39* 50* 55* 54*  --  44*  CREATININE 2.63* 3.10* 3.41* 3.98* 4.29* 4.09*  --  3.34*  CALCIUM 7.9* 8.0* 7.7* 7.5* 7.5* 7.1*  --  7.0*  PHOS  --   --   --   --  7.3* 7.1* 7.0* 4.7*    Recent Labs Lab 10/14/16 0520 10/15/16 0433 10/15/16 1600 10/16/16 0306  AST 22  --   --   --   ALT 18  --   --   --   ALKPHOS 75  --   --   --   BILITOT 0.9  --   --   --   PROT 4.9*  --   --   --  ALBUMIN 2.4* 2.4* 2.2* 2.1*    Recent Labs Lab 10/13/16 0341 10/14/16 0520 10/15/16 0433 10/16/16 0306  WBC 15.3* 16.6* 15.0* 18.8*  HGB 8.6* 8.5* 8.5* 9.2*  HCT 25.4* 26.2* 24.9* 27.6*  MCV 88.8 88.8 88.3 87.3  PLT 123* 143* 183 225   Lab Results  Component Value Date   INR 1.94 10/16/2016   INR 1.23 10/15/2016   INR 1.20 10/14/2016    Recent Labs Lab 10/15/16 1558 10/15/16 2004 10/15/16 2310 10/16/16 0353 10/16/16 0807  GLUCAP 153* 120* 88 84 82     Iron/TIBC/Ferritin/ %Sat    Component Value Date/Time   IRON 8 (L) 10/15/2016 0433   TIBC 140 (L) 10/15/2016 0433   IRONPCTSAT 6 (L) 10/15/2016 0433   Studies/Results: Dg Chest Port 1 View  Result Date: 10/16/2016 CLINICAL DATA:  83 year old male with history of respiratory failure. EXAM: PORTABLE CHEST 1 VIEW COMPARISON:  Chest x-ray 10/15/2016. FINDINGS: An endotracheal tube is in place with tip 5.2 cm above the carina. A nasogastric tube is seen extending into the stomach, however, the tip of the nasogastric tube extends below the lower margin of the image. There is a right-sided internal jugular central venous catheter with tip terminating in the internal jugular vein. There is a left-sided subclavian Vas-Cath with tip terminating in the superior cavoatrial junction. Lung volumes are low. Worsening patchy multifocal interstitial and airspace disease throughout the  lungs bilaterally concerning for worsening pulmonary edema. Small bilateral pleural effusions (right greater than left). Mild cardiomegaly. The patient is rotated to the right on today's exam, resulting in distortion of the mediastinal contours and reduced diagnostic sensitivity and specificity for mediastinal pathology. Status post median sternotomy for CABG. Left atrial appendage ligation clip also noted. IMPRESSION: 1. Support apparatus and postoperative changes, as above. 2. The appearance the chest suggests worsening congestive heart failure, as above. Electronically Signed   By: Vinnie Langton M.D.   On: 10/16/2016 07:45   Dg Chest Port 1 View  Result Date: 10/15/2016 CLINICAL DATA:  Status post placement of endotracheal tube and nasogastric tube. EXAM: PORTABLE CHEST 1 VIEW COMPARISON:  Portable chest x-ray of October 15, 2016 at 5:42 a.m. FINDINGS: The endotracheal tube tip appears to lie at the level of the carina. The esophagogastric tube tip in proximal port project below the inferior margin of the image. The dual-lumen dialysis catheter tip projects over the junction of the middle and distal thirds of the SVC. The cardiac silhouette is enlarged. The pulmonary vascularity is mildly prominent. The lung bases exhibit increased density bilaterally with obscuration of the hemidiaphragm. There are post CABG changes with left atrial appendage clip placement. There is thoracic aortic atherosclerosis. IMPRESSION: The endotracheal tube appears to lie at the level of the carina. Withdrawal by 1-2 cm is recommended followed by portal chest x-ray using better better technique to achieve adequate penetration. Worsening bibasilar atelectasis or pneumonia. Probable right pleural effusion. Thoracic aortic atherosclerosis. Electronically Signed   By: David  Martinique M.D.   On: 10/15/2016 12:56   Dg Chest Port 1 View  Result Date: 10/15/2016 CLINICAL DATA:  Congestive heart failure EXAM: PORTABLE CHEST 1 VIEW  COMPARISON:  October 14, 2016 FINDINGS: Cordis tip is in the right jugular vein, stable. No pneumothorax. There is cardiomegaly with pulmonary venous hypertension. There is relatively mild interstitial edema with minimal pleural effusions bilaterally. There is midlung atelectatic change bilaterally. Patient is status post coronary artery bypass grafting. There is an atrial appendage clamp on the left. There  is aortic atherosclerosis. Postoperative changes noted in the right cervical-thoracic junction region. There is a total shoulder replacement on the left. IMPRESSION: Findings indicative of a degree of congestive heart failure. There appears to be slightly less edema compared to 1 day prior. No new opacity evident. Stable cardiac enlargement. There is aortic atherosclerosis. No pneumothorax. Aortic Atherosclerosis (ICD10-I70.0). Electronically Signed   By: Lowella Grip III M.D.   On: 10/15/2016 07:16   Dg Abd Portable 1v  Result Date: 10/15/2016 CLINICAL DATA:  The patient has undergone placement of an esophagogastric tube. EXAM: PORTABLE ABDOMEN - 1 VIEW COMPARISON:  None in PACs FINDINGS: The esophagogastric tube tip lies in the proximal gastric cardia. The proximal port lies at or above the GE junction. There are loops of moderately distended small bowel within the mid upper abdomen. IMPRESSION: High placement of the esophagogastric tube. Advancement by 10-20 cm is needed to assure that the proximal port remains below the GE junction. Distention of bowel loops most compatible with an ileus. Electronically Signed   By: David  Martinique M.D.   On: 10/15/2016 12:57   Medications: . sodium chloride    . sodium chloride    . amiodarone 30 mg/hr (10/16/16 0700)  . DOPamine 3 mcg/kg/min (10/16/16 0830)  . fentaNYL infusion INTRAVENOUS    . ferumoxytol Stopped (10/15/16 1407)  . furosemide (LASIX) infusion Stopped (10/15/16 1200)  . heparin 10,000 units/ 20 mL infusion syringe 1,350 Units/hr (10/16/16  0900)  . heparin    . lactated ringers    . lactated ringers Stopped (10/09/16 1708)  . lactated ringers Stopped (10/15/16 0700)  . phenylephrine (NEO-SYNEPHRINE) Adult infusion 125 mcg/min (10/16/16 0830)  . dialysis replacement fluid (prismasate) 300 mL/hr at 10/16/16 0620  . dialysis replacement fluid (prismasate) 200 mL/hr at 10/15/16 1330  . dialysate (PRISMASATE) 1,000 mL/hr at 10/16/16 0916   . aspirin  300 mg Rectal Daily  . atorvastatin  40 mg Per Tube q1800  . bisacodyl  10 mg Oral Daily   Or  . bisacodyl  10 mg Rectal Daily  . chlorhexidine gluconate (MEDLINE KIT)  15 mL Mouth Rinse BID  . docusate sodium  200 mg Oral Daily  . feeding supplement (PRO-STAT SUGAR FREE 64)  30 mL Per Tube BID  . feeding supplement (VITAL HIGH PROTEIN)  1,000 mL Per Tube Q24H  . folic acid  1 mg Per Tube Daily  . guaiFENesin  1,200 mg Oral BID  . insulin aspart  0-24 Units Subcutaneous Q4H  . levothyroxine  25 mcg Per Tube QAC breakfast  . mouth rinse  15 mL Mouth Rinse 10 times per day  . metoprolol tartrate  25 mg Per Tube BID  . midazolam  2 mg Intravenous Once  . moving right along book   Does not apply Once  . pantoprazole sodium  40 mg Per Tube Daily  . patient's guide to using coumadin book   Does not apply Once  . rocuronium  50 mg Intravenous Once  . sodium chloride flush  10-40 mL Intracatheter Q12H  . sodium chloride flush  3 mL Intravenous Q12H  . sodium chloride flush  3 mL Intravenous Q12H  . warfarin  5 mg Oral q1800  . Warfarin - Physician Dosing Inpatient   Does not apply q1800    Background: 83 y.o. year-old with PMH DM, HTN, HLD, chronic dCHF, COPD, PAF, RA, secondary HPT. Known CKD3/4 (DM, HTN, prior NSAID use; followed by Dr. Marval Regal). Last creatinine at [facility]  2.61 08/28/16. Heart cath 10/07/16 90% LM ds. S/p 2V CABG, MAZE procedure, clipping of atrial appendage by Dr. Roxy Manns  10/09/16. Persistent hypotension with insidious worsening of renal function, UOP  trailed off despite lasix drip.  We were asked to see for hemodynamically driven AKI on CKD4. CRRT initiated 10/3 2/2 massive overload/resp failure, worsening AKI.   Assessment/Recommendations  1. AKI on CKD3/4 - post CABG and MAZE procedure. Steady rise in creatinine, no response to ceiling dose diuretics.  Hemodynamically driven (low blood pressures) AKI  in kidneys with little reserve.  1. Left IJ vas cath by CCM (10/3) 2. CRRT initiated 10/15/16 tolerating fine. K and phos good so far. Monitor for need for repletion.  3. Continue goal net neg 50-100, higher if BP allows (requiring pressor support) 4. Stop lasix drip 2. S/P CABG/MAZE procedure - low BP's post. ECHO 10/2;  no pericardial effusion. 1. For now just dopamine and neo 3. Anemia - ABLA. No prior significant CKD related anemia no outpt ESA requirement 1. Transfuse for Hb <7 2. Dosed Aranesp 150 10/2, then weekly.  3. Feraheme dosed 10/3, redose 10/10. (tsat 6%) 4. Diabetes with neuropathy 1. Stopped gabapentin (early side effects from accumulation) 5. Hyponatremia - fluid overload. Improving with CRRT 6. Pressor dependent hypotension - hope is that once volume is controlled can wean pressors and see some improvement in BP.    Jamal Maes, MD [facility] [phone] pager 10/16/2016, 9:39 AM

## 2016-10-16 NOTE — Progress Notes (Signed)
RN called, due to decrease in SAT. SAT as low as 82% on 100% FIO2. Lavaged and didn't get anything. Recruitment done, SAT increased to 94%. Slowly began to fall again, ABG obtained. NP then at bedside, and we slowly began to increase PEEP to 12. SAT was stable at 90% when chest xray results showed pneumothorax. PEEP then returned to 5 per MD.

## 2016-10-16 NOTE — Procedures (Signed)
[place] Placement  Patient positioned and area cleaned.  Lidocaine injected.  Skin incision made followed by needle insertion into the chest tube.  Wire placed.  Needle removed.  Dilator placed.  Dilator removed.  Catheter placed and wire removed.  Tube sutured to skin and placed on pleuravac.  HR and BP stabilized immediately but sat remains an issue.  Rush Farmer, M.D. [facility] Pulmonary/Critical Care Medicine. Pager: [phone]. After hours pager: [phone].

## 2016-10-16 NOTE — Progress Notes (Signed)
CRITICAL VALUE ALERT  Critical Value:  Lactate 4.2  Date & Time Notied: 10/16/16 1515  Provider Notified: Dr. Nelda Marseille  Orders Received/Actions taken: MD already at the bedside placing orders

## 2016-10-16 NOTE — Progress Notes (Signed)
Called by bedside RN, patient's BP and saturation have deteriorated.  Physical exam, hyper tympanic right lung with decreased BS.  CXR confirmed PTX.  While setting up for a chest tube the patient's HR increased, BP and sat dropped.  Patient was needle decompressed and catheter held until CT was placed.  Family updated 3 times throughout the process.  Dr. Nils Pyle came and was updated.  At then end of the stabilization, vitals except for sat were stable but patient remained hypoxemic but pH and CO2 improved.  Will continue to monitor.  The patient is critically ill with multiple organ systems failure and requires high complexity decision making for assessment and support, frequent evaluation and titration of therapies, application of advanced monitoring technologies and extensive interpretation of multiple databases.   Critical Care Time devoted to patient care services described in this note is  60  Minutes. This time reflects time of care of this signee Dr Jennet Maduro. This critical care time does not reflect procedure time, or teaching time or supervisory time of PA/NP/Med student/Med Resident etc but could involve care discussion time.  Rush Farmer, M.D. [facility] Pulmonary/Critical Care Medicine. Pager: [phone]. After hours pager: [phone].

## 2016-10-16 NOTE — Progress Notes (Signed)
ANTICOAGULATION and ANTIBIOTIC CONSULT NOTE - Initial Consult  Pharmacy Consult for Vancomycin, Zosyn, and Heparin Indication: atrial fibrillation and sepsis  No Known Allergies  Patient Measurements: Height: 5\' 8"  (172.7 cm) Weight: 249 lb 1.9 oz (113 kg) IBW/kg (Calculated) : 68.4 Heparin Dosing Weight: 92kg  Vital Signs: Temp: 96.3 F (35.7 C) (10/04 1530) Temp Source: Core (Comment) (10/04 1200) BP: 106/57 (10/04 1520) Pulse Rate: 76 (10/04 1530)  Labs:  Recent Labs  10/14/16 0520 10/15/16 0433 10/15/16 1600 10/16/16 0306 10/16/16 1515  HGB 8.5* 8.5*  --  9.2* 9.9*  HCT 26.2* 24.9*  --  27.6* 29.6*  PLT 143* 183  --  225 208  APTT  --   --   --  132*  --   LABPROT 15.1 15.4*  --  22.0*  --   INR 1.20 1.23  --  1.94  --   CREATININE 3.98* 4.29* 4.09* 3.34*  --     Estimated Creatinine Clearance: 20.4 mL/min (A) (by C-G formula based on SCr of 3.34 mg/dL (H)).  Assessment: Mr. Mode is a 83 y/o M who presented for a CABG. Afterwards his renal function declined, he developed respiratory failure, and CRRT was started. In the afternoon on 10/4, he had worsening hypotension despite pressors, progressive hypoxia, and increasing PEEP. After a CXR, a spontaneous pneumothorax was discovered and a chest tube placed. Empiric antibiotics were started to cover potential sepsis and pathogens from the procedure.  [name] was also on warfarin for afib anticoagulation and due to his declining condition and procedures, warfarin was stopped and heparin started. The INR today was 1.94 and was trending up, so it may be necessary to hold the heparin tomorrow and trend INR's. Will dose the heparin conservatively and without bolus due to the bleeding risk.  Goal of Therapy:  Heparin level 0.3-0.7 units/ml Monitor platelets by anticoagulation protocol: Yes  Vancomycin trough 15-20   Plan:  Heparin 1300 units/hr infusion HL at 0300, INR tomorrow AM Monitor for s/sx of  bleeding  Vancomycin 1750mg  loading dose Vancomycin 1000mg  Q24h maintenance dose Zosyn 3.375g Q6h Monitor clinical status, LOT, vancomycin troughs as indicated   Patterson Hammersmith PharmD PGY1 Pharmacy Practice Resident 10/16/2016 4:15 PM Pager: [phone]

## 2016-10-16 NOTE — Progress Notes (Signed)
PULMONARY / CRITICAL CARE MEDICINE   Name: [name] MRN: [id number] DOB: 1931/01/02    ADMISSION DATE:  10/09/2016 CONSULTATION DATE:  10/15/2016  REFERRING MD:  Ricard Dillon  CHIEF COMPLAINT:  Respiratory failure and acute pulmonary edema  HISTORY OF PRESENT ILLNESS:   83 year old male with extensive PMH who presents to PCCM after a CABG and MAZE.  In the ICU, renal function continued to deteriorate and patient stopped responding to lasix.  Renal service was consulted and CRRT was recommended.  Patient developed respiratory failure and BiPAP was started.  PCCM was consulted.  Patient in respiratory failure.   SUBJECTIVE:  No acute change overnight. Weaning pressors. Pulling 80-144ml/hr on CVVHD.   VITAL SIGNS: BP (!) 126/51 (BP Location: Right Arm)   Pulse 98   Temp 98.2 F (36.8 C)   Resp 20   Ht 5\' 8"  (1.727 m)   Wt 113 kg (249 lb 1.9 oz)   SpO2 97%   BMI 37.88 kg/m   HEMODYNAMICS:    VENTILATOR SETTINGS: Vent Mode: PRVC FiO2 (%):  [100 %] 100 % Set Rate:  [16 bmp] 16 bmp Vt Set:  [680 mL] 680 mL PEEP:  [5 cmH20] 5 cmH20 Plateau Pressure:  [28 [id number] cmH20] 29 cmH20  INTAKE / OUTPUT: I/O last 3 completed shifts: In: 4279.4 [P.O.:120; I.V.:3972.4; NG/GT:70; IV Piggyback:117] Out: 2694 [Urine:485; Other:2570]  PHYSICAL EXAMINATION: General:  Chronically ill appearing, severe respiratory distress. Neuro:  Awake on vent, RASS 0, c/o discomfort, follows commands  HEENT:  [place]/AT, PERRL, EOM-I and MMM Cardiovascular:  RRR, Nl S1/S2, -M/R/G. Lungs:  Diffuse crackles in all lung fields Abdomen:  Distended, soft, mildly tender, +BM per RN  Musculoskeletal:  2+ edema and -tenderness Skin:  Intact  LABS:  BMET  Recent Labs Lab 10/15/16 0433 10/15/16 1600 10/16/16 0306  NA 125* 126* 129*  K 4.5 4.2 4.1  CL 88* 92* 96*  CO2 22 19* 21*  BUN 55* 54* 44*  CREATININE 4.29* 4.09* 3.34*  GLUCOSE 125* 144* 81    Electrolytes  Recent Labs Lab 10/12/16 0414   10/15/16 0433 10/15/16 1600 10/15/16 1610 10/16/16 0306  CALCIUM 8.0*  < > 7.5* 7.1*  --  7.0*  MG 2.2  --   --   --  2.2 2.2  PHOS  --   < > 7.3* 7.1* 7.0* 4.7*  < > = values in this interval not displayed.  CBC  Recent Labs Lab 10/14/16 0520 10/15/16 0433 10/16/16 0306  WBC 16.6* 15.0* 18.8*  HGB 8.5* 8.5* 9.2*  HCT 26.2* 24.9* 27.6*  PLT 143* 183 225    Coag's  Recent Labs Lab 10/09/16 1520  10/14/16 0520 10/15/16 0433 10/16/16 0306  APTT 32  --   --   --  132*  INR 1.38  < > 1.20 1.23 1.94  < > = values in this interval not displayed.  Sepsis Markers No results for input(s): LATICACIDVEN, PROCALCITON, O2SATVEN in the last 168 hours.  ABG  Recent Labs Lab 10/10/16 0354 10/14/16 0604 10/16/16 0335  PHART 7.301* 7.275* 7.295*  PCO2ART 48.1* 49.2* 44.9  PO2ART 104.0 83.7 64.3*    Liver Enzymes  Recent Labs Lab 10/14/16 0520 10/15/16 0433 10/15/16 1600 10/16/16 0306  AST 22  --   --   --   ALT 18  --   --   --   ALKPHOS 75  --   --   --   BILITOT 0.9  --   --   --  ALBUMIN 2.4* 2.4* 2.2* 2.1*    Cardiac Enzymes No results for input(s): TROPONINI, PROBNP in the last 168 hours.  Glucose  Recent Labs Lab 10/15/16 1248 10/15/16 1558 10/15/16 2004 10/15/16 2310 10/16/16 0353 10/16/16 0807  GLUCAP 91 153* 120* 88 84 82    Imaging Dg Chest Port 1 View  Result Date: 10/16/2016 CLINICAL DATA:  83 year old male with history of respiratory failure. EXAM: PORTABLE CHEST 1 VIEW COMPARISON:  Chest x-ray 10/15/2016. FINDINGS: An endotracheal tube is in place with tip 5.2 cm above the carina. A nasogastric tube is seen extending into the stomach, however, the tip of the nasogastric tube extends below the lower margin of the image. There is a right-sided internal jugular central venous catheter with tip terminating in the internal jugular vein. There is a left-sided subclavian Vas-Cath with tip terminating in the superior cavoatrial junction. Lung  volumes are low. Worsening patchy multifocal interstitial and airspace disease throughout the lungs bilaterally concerning for worsening pulmonary edema. Small bilateral pleural effusions (right greater than left). Mild cardiomegaly. The patient is rotated to the right on today's exam, resulting in distortion of the mediastinal contours and reduced diagnostic sensitivity and specificity for mediastinal pathology. Status post median sternotomy for CABG. Left atrial appendage ligation clip also noted. IMPRESSION: 1. Support apparatus and postoperative changes, as above. 2. The appearance the chest suggests worsening congestive heart failure, as above. Electronically Signed   By: Vinnie Langton M.D.   On: 10/16/2016 07:45   Dg Chest Port 1 View  Result Date: 10/15/2016 CLINICAL DATA:  Status post placement of endotracheal tube and nasogastric tube. EXAM: PORTABLE CHEST 1 VIEW COMPARISON:  Portable chest x-ray of October 15, 2016 at 5:42 a.m. FINDINGS: The endotracheal tube tip appears to lie at the level of the carina. The esophagogastric tube tip in proximal port project below the inferior margin of the image. The dual-lumen dialysis catheter tip projects over the junction of the middle and distal thirds of the SVC. The cardiac silhouette is enlarged. The pulmonary vascularity is mildly prominent. The lung bases exhibit increased density bilaterally with obscuration of the hemidiaphragm. There are post CABG changes with left atrial appendage clip placement. There is thoracic aortic atherosclerosis. IMPRESSION: The endotracheal tube appears to lie at the level of the carina. Withdrawal by 1-2 cm is recommended followed by portal chest x-ray using better better technique to achieve adequate penetration. Worsening bibasilar atelectasis or pneumonia. Probable right pleural effusion. Thoracic aortic atherosclerosis. Electronically Signed   By: David  Martinique M.D.   On: 10/15/2016 12:56   Dg Abd Portable 1v  Result  Date: 10/15/2016 CLINICAL DATA:  The patient has undergone placement of an esophagogastric tube. EXAM: PORTABLE ABDOMEN - 1 VIEW COMPARISON:  None in PACs FINDINGS: The esophagogastric tube tip lies in the proximal gastric cardia. The proximal port lies at or above the GE junction. There are loops of moderately distended small bowel within the mid upper abdomen. IMPRESSION: High placement of the esophagogastric tube. Advancement by 10-20 cm is needed to assure that the proximal port remains below the GE junction. Distention of bowel loops most compatible with an ileus. Electronically Signed   By: David  Martinique M.D.   On: 10/15/2016 12:57     STUDIES:  CXR with pulmonary edema 10/3  CULTURES: None  ANTIBIOTICS: None  SIGNIFICANT EVENTS: 10/3 hypoxemic respiratory failure requiring intubation  LINES/TUBES: ETT 10/3>>> L [place] Trialysis catheter 10/3>>> L radial a-line 10/3>>> R IJ single lumen catheter 10/3>>>  DISCUSSION:  83 year old male with extensive PMH with pulmonary edema, renal failure, respiratory failure needing CRRT.  ASSESSMENT / PLAN:  PULMONARY A: Acute hypoxemic respiratory failure from acute pulmonary edema P:   Vent support - 8cc/kg  F/u CXR  F/u ABG Remains on 100% FIO2 - wean as able  SBT when oxygen demands improved    CARDIOVASCULAR A:  CHF Cardiogenic shock CAD s/p CABG/MAZE 9/27 P:  Continue neo, dopamine and wean as able  Volume removal with CVVHD  CVTS primary  Amiodarone gtt    RENAL A:   Acute renal failure due to hypotension Hyponatremia  P:   Renal following  Volume removal with CVVHD  F/u chem  Replace electrolytes as needed    GASTROINTESTINAL A:   Post op ileus  P:   Continue bowel regimen  Will add trickle TF 10/4 F/u KUB   HEMATOLOGIC A:   Anemia - post op. Stable  P:  F/u CBC  Coumadin per CVTS/pharmacy    INFECTIOUS A:   No active issues P:   - Monitor WBC and fever curve - Hold off abx.  ENDOCRINE A:    DM  Hypoglycemia  P:   SSI  Hold levimir  Should improve with trickle TF   NEUROLOGIC A:   AMS due to respiratory failure, non-focal exam P:   RASS goal: 0 Will add low dose fentanyl gtt for pain - max 48mcg/hr    FAMILY  - Updates: Family updated bedside 10/4  - Inter-disciplinary family meet or Palliative Care meeting due by:  day [place], NP 10/16/2016  9:31 AM Pager: [phone] or [phone]  Attending Note:  83 year old male s/p CABG and MAZE procedure who was intubated on 10/3 due to renal failure and pulmonary edema.  CRRT has started and patient seems to be improving.  On exam, lungs with crackles but improved compared to yesterday.  I reviewed CXR myself, ETT is in good position.  CRRT with volume negative as able.  Continue BP support as needed.  Hold off weaning for now.  Anticipate with volume negative that patient will improve in the next couple of days and be able to extubate.  PCCM will continue to follow.  The patient is critically ill with multiple organ systems failure and requires high complexity decision making for assessment and support, frequent evaluation and titration of therapies, application of advanced monitoring technologies and extensive interpretation of multiple databases.   Critical Care Time devoted to patient care services described in this note is  35  Minutes. This time reflects time of care of this signee Dr Jennet Maduro. This critical care time does not reflect procedure time, or teaching time or supervisory time of PA/NP/Med student/Med Resident etc but could involve care discussion time.  Rush Farmer, M.D. [facility] Pulmonary/Critical Care Medicine. Pager: [phone]. After hours pager: [phone].

## 2016-10-16 NOTE — Progress Notes (Addendum)
PT Cancellation Note  Patient Details Name: [name] MRN: [id number] DOB: January 21, 1930   Cancelled Treatment:    Reason Eval/Treat Not Completed: Medical issues which prohibited therapy Pt INR is currently at subtherapeutic level, as well as being intubated and on CRRT. PT will continue to follow for treatment appropriateness. Thanks.  Ramonita Koenig B. Migdalia Dk PT, DPT Acute Rehabilitation  [phone] Pager [phone]  Central 10/16/2016, 7:50 AM

## 2016-10-16 NOTE — Procedures (Signed)
Central Venous Catheter Insertion Procedure Note [name] [id number] [birth year]  Procedure: Insertion of Central Venous Catheter Indications: Assessment of intravascular volume, Drug and/or fluid administration and Frequent blood sampling  Procedure Details Consent: Risks of procedure as well as the alternatives and risks of each were explained to the (patient/caregiver).  Consent for procedure obtained. Time Out: Verified patient identification, verified procedure, site/side was marked, verified correct patient position, special equipment/implants available, medications/allergies/relevent history reviewed, required imaging and test results available.  Performed  Maximum sterile technique was used including antiseptics, cap, gloves, gown, hand hygiene, mask and sheet. Skin prep: Chlorhexidine; local anesthetic administered A antimicrobial bonded/coated triple lumen catheter was placed in the right subclavian vein using the Seldinger technique.  Evaluation Blood flow good Complications: No apparent complications Patient did tolerate procedure well. Chest X-ray ordered to verify placement.  CXR: pending.  U/S used in placement  [name],[name] 10/16/2016, 4:24 PM

## 2016-10-16 NOTE — Progress Notes (Signed)
RT transported patient from room 2H18 to CT and back to room 2H18 with no apparent complications. Vitals are stable and sats are 96%. RT will continue to monitor.

## 2016-10-17 ENCOUNTER — Inpatient Hospital Stay (HOSPITAL_COMMUNITY): Payer: Medicare Other

## 2016-10-17 DIAGNOSIS — N171 Acute kidney failure with acute cortical necrosis: Secondary | ICD-10-CM

## 2016-10-17 LAB — PROTIME-INR
INR: 5.7 — AB
Prothrombin Time: 51.1 seconds — ABNORMAL HIGH (ref 11.4–15.2)

## 2016-10-17 LAB — CBC
HCT: 23.6 % — ABNORMAL LOW (ref 39.0–52.0)
HCT: 26.1 % — ABNORMAL LOW (ref 39.0–52.0)
HEMOGLOBIN: 9 g/dL — AB (ref 13.0–17.0)
Hemoglobin: 8.4 g/dL — ABNORMAL LOW (ref 13.0–17.0)
MCH: 29.1 pg (ref 26.0–34.0)
MCH: 30.2 pg (ref 26.0–34.0)
MCHC: 34.5 g/dL (ref 30.0–36.0)
MCHC: 35.6 g/dL (ref 30.0–36.0)
MCV: 84.5 fL (ref 78.0–100.0)
MCV: 84.9 fL (ref 78.0–100.0)
Platelets: 178 10*3/uL (ref 150–400)
Platelets: 210 10*3/uL (ref 150–400)
RBC: 2.78 MIL/uL — ABNORMAL LOW (ref 4.22–5.81)
RBC: 3.09 MIL/uL — AB (ref 4.22–5.81)
RDW: 14.7 % (ref 11.5–15.5)
RDW: 14.9 % (ref 11.5–15.5)
WBC: 15.2 10*3/uL — ABNORMAL HIGH (ref 4.0–10.5)
WBC: 18.5 10*3/uL — ABNORMAL HIGH (ref 4.0–10.5)

## 2016-10-17 LAB — HEPATIC FUNCTION PANEL
ALT: 414 U/L — AB (ref 17–63)
AST: 807 U/L — AB (ref 15–41)
Albumin: 1.9 g/dL — ABNORMAL LOW (ref 3.5–5.0)
Alkaline Phosphatase: 229 U/L — ABNORMAL HIGH (ref 38–126)
BILIRUBIN DIRECT: 0.5 mg/dL (ref 0.1–0.5)
Indirect Bilirubin: 0.8 mg/dL (ref 0.3–0.9)
Total Bilirubin: 1.3 mg/dL — ABNORMAL HIGH (ref 0.3–1.2)
Total Protein: 4.7 g/dL — ABNORMAL LOW (ref 6.5–8.1)

## 2016-10-17 LAB — BLOOD GAS, ARTERIAL
Acid-Base Excess: 1 mmol/L (ref 0.0–2.0)
Bicarbonate: 24.8 mmol/L (ref 20.0–28.0)
Drawn by: 36496
FIO2: 90
MECHVT: 660 mL
O2 Saturation: 95.9 %
PEEP: 12 cmH2O
Patient temperature: 98.6
RATE: 24 resp/min
pCO2 arterial: 37.7 mmHg (ref 32.0–48.0)
pH, Arterial: 7.434 (ref 7.350–7.450)
pO2, Arterial: 84.1 mmHg (ref 83.0–108.0)

## 2016-10-17 LAB — POCT ACTIVATED CLOTTING TIME
ACTIVATED CLOTTING TIME: 290 s
ACTIVATED CLOTTING TIME: 191 s
ACTIVATED CLOTTING TIME: 191 s
ACTIVATED CLOTTING TIME: 191 s
Activated Clotting Time: 186 seconds
Activated Clotting Time: 191 seconds
Activated Clotting Time: 191 seconds
Activated Clotting Time: 191 seconds
Activated Clotting Time: 202 seconds
Activated Clotting Time: 208 seconds
Activated Clotting Time: 236 seconds

## 2016-10-17 LAB — GLUCOSE, CAPILLARY
GLUCOSE-CAPILLARY: 106 mg/dL — AB (ref 65–99)
GLUCOSE-CAPILLARY: 135 mg/dL — AB (ref 65–99)
Glucose-Capillary: 130 mg/dL — ABNORMAL HIGH (ref 65–99)
Glucose-Capillary: 147 mg/dL — ABNORMAL HIGH (ref 65–99)
Glucose-Capillary: 171 mg/dL — ABNORMAL HIGH (ref 65–99)

## 2016-10-17 LAB — RENAL FUNCTION PANEL
ALBUMIN: 1.9 g/dL — AB (ref 3.5–5.0)
ANION GAP: 13 (ref 5–15)
Albumin: 2 g/dL — ABNORMAL LOW (ref 3.5–5.0)
Anion gap: 14 (ref 5–15)
BUN: 33 mg/dL — ABNORMAL HIGH (ref 6–20)
BUN: 30 mg/dL — ABNORMAL HIGH (ref 6–20)
CALCIUM: 6.7 mg/dL — AB (ref 8.9–10.3)
CHLORIDE: 95 mmol/L — AB (ref 101–111)
CO2: 23 mmol/L (ref 22–32)
CO2: 26 mmol/L (ref 22–32)
CREATININE: 2.41 mg/dL — AB (ref 0.61–1.24)
CREATININE: 2.22 mg/dL — AB (ref 0.61–1.24)
Calcium: 6.9 mg/dL — ABNORMAL LOW (ref 8.9–10.3)
Chloride: 94 mmol/L — ABNORMAL LOW (ref 101–111)
GFR calc Af Amer: 30 mL/min — ABNORMAL LOW (ref >60)
GFR, EST AFRICAN AMERICAN: 27 mL/min — AB (ref >60)
GFR, EST NON AFRICAN AMERICAN: 23 mL/min — AB (ref >60)
GFR, EST NON AFRICAN AMERICAN: 26 mL/min — AB (ref >60)
Glucose, Bld: 132 mg/dL — ABNORMAL HIGH (ref 65–99)
Glucose, Bld: 180 mg/dL — ABNORMAL HIGH (ref 65–99)
POTASSIUM: 3.7 mmol/L (ref 3.5–5.1)
Phosphorus: 3.2 mg/dL (ref 2.5–4.6)
Phosphorus: 2.2 mg/dL — ABNORMAL LOW (ref 2.5–4.6)
Potassium: 3.7 mmol/L (ref 3.5–5.1)
SODIUM: 131 mmol/L — AB (ref 135–145)
Sodium: 134 mmol/L — ABNORMAL LOW (ref 135–145)

## 2016-10-17 LAB — POCT I-STAT 3, ART BLOOD GAS (G3+)
ACID-BASE DEFICIT: 1 mmol/L (ref 0.0–2.0)
BICARBONATE: 23.4 mmol/L (ref 20.0–28.0)
O2 Saturation: 93 %
PO2 ART: 61 mmHg — AB (ref 83.0–108.0)
Patient temperature: 35.7
TCO2: 25 mmol/L (ref 22–32)
pCO2 arterial: 35.4 mmHg (ref 32.0–48.0)
pH, Arterial: 7.423 (ref 7.350–7.450)

## 2016-10-17 LAB — COOXEMETRY PANEL
Carboxyhemoglobin: 1 % (ref 0.5–1.5)
Methemoglobin: 1 % (ref 0.0–1.5)
O2 Saturation: 53.4 %
Total hemoglobin: 9.5 g/dL — ABNORMAL LOW (ref 12.0–16.0)

## 2016-10-17 LAB — APTT

## 2016-10-17 LAB — HEPARIN LEVEL (UNFRACTIONATED): Heparin Unfractionated: 1.46 IU/mL — ABNORMAL HIGH (ref 0.30–0.70)

## 2016-10-17 LAB — MAGNESIUM: MAGNESIUM: 2.3 mg/dL (ref 1.7–2.4)

## 2016-10-17 LAB — LACTIC ACID, PLASMA
LACTIC ACID, VENOUS: 2.2 mmol/L — AB (ref 0.5–1.9)
LACTIC ACID, VENOUS: 2.1 mmol/L — AB (ref 0.5–1.9)

## 2016-10-17 LAB — PHOSPHORUS: Phosphorus: 3 mg/dL (ref 2.5–4.6)

## 2016-10-17 MED FILL — Medication: Qty: 1 | Status: AC

## 2016-10-17 NOTE — Progress Notes (Signed)
ANTICOAGULATION and ANTIBIOTIC CONSULT NOTE - Initial Consult  Pharmacy Consult for Heparin Indication: afib  No Known Allergies  Patient Measurements: Height: 5\' 8"  (172.7 cm) Weight: 246 lb 0.5 oz (111.6 kg) IBW/kg (Calculated) : 68.4 Heparin Dosing Weight: 92kg  Vital Signs: Temp: 99 F (37.2 C) (10/05 0400) Temp Source: Core (Comment) (10/04 2000) BP: 94/53 (10/05 0400) Pulse Rate: 87 (10/05 0400)  Labs:  Recent Labs  10/15/16 0433 10/15/16 1600 10/16/16 0306 10/16/16 1515 10/16/16 2355 10/17/16 0335  HGB 8.5*  --  9.2* 9.9* 8.4* 9.0*  HCT 24.9*  --  27.6* 29.6* 23.6* 26.1*  PLT 183  --  225 208 178 210  APTT  --   --  132*  --   --  >200*  LABPROT 15.4*  --  22.0*  --   --  51.1*  INR 1.23  --  1.94  --   --  5.70*  HEPARINUNFRC  --   --   --   --   --  1.46*  CREATININE 4.29* 4.09* 3.34* 2.98*  2.95*  --   --     Estimated Creatinine Clearance: 23 mL/min (A) (by C-G formula based on SCr of 2.95 mg/dL (H)).  Assessment: Mr. Gombos is a 83 y/o M who presented for a CABG. Afterwards his renal function declined, he developed respiratory failure, and CRRT was started. In the afternoon on 10/4, he had worsening hypotension despite pressors, progressive hypoxia, and increasing PEEP. After a CXR, a spontaneous pneumothorax was discovered and a chest tube placed. Empiric antibiotics were started to cover potential sepsis and pathogens from the procedure.  Mr. Soderholm was also on warfarin for afib anticoagulation and due to his declining condition and procedures, warfarin was stopped and heparin started.   AM critical labs as follows: Hep Lvl 1.46, aPTT > 200, INR 5.70 Also ACT for CRRT heparin is 291  No bleeding per RN  Goal of Therapy:  Heparin level 0.3-0.7 units/ml Monitor platelets by anticoagulation protocol: Yes    Plan:  DC systemic heparin per protocol and Dr. Servando Snare Daily HL, INR (next Sat)  CRRT heparin also on hold per protocol Will discuss with  renal in am about [facility] plan for CRRT circuit going forward  Levester Fresh, PharmD, BCPS, [place] Clinical Pharmacist Clinical phone for 10/17/2016 from 7a-3:30p: [phone] If after 3:30p, please call main pharmacy at: [phone] 10/17/2016 5:07 AM

## 2016-10-17 NOTE — Procedures (Signed)
Central Venous Catheter Insertion Procedure Note [name] [id number] 02-18-1930  Procedure: Insertion of Central Venous Catheter Indications: Assessment of intravascular volume and Drug and/or fluid administration  Procedure Details Consent: Risks of procedure as well as the alternatives and risks of each were explained to the (patient/caregiver).  Consent for procedure obtained. Time Out: Verified patient identification, verified procedure, site/side was marked, verified correct patient position, special equipment/implants available, medications/allergies/relevent history reviewed, required imaging and test results available.  Performed  Maximum sterile technique was used including antiseptics, cap, gloves, gown, hand hygiene, mask and sheet. Skin prep: Chlorhexidine; local anesthetic administered A antimicrobial bonded/coated triple lumen catheter was placed in the right internal jugular vein using the Seldinger technique.  Ultrasound was used to verify the patency of the vein and for real time needle guidance.  Evaluation Blood flow good Complications: No apparent complications Patient did tolerate procedure well. Chest X-ray ordered to verify placement.  CXR: pending.  Simonne Maffucci 10/17/2016, 4:37 PM

## 2016-10-17 NOTE — Progress Notes (Signed)
Dr. Lake Bells made aware PICC RN unable to pass catheter into the SVC. Verbal orders given to prepare for CVC placement in IJ

## 2016-10-17 NOTE — Progress Notes (Signed)
[address]       [place] [address]             [phone]        CARDIOTHORACIC SURGERY PROGRESS NOTE   R8 Days Post-Op Procedure(s) (LRB): CORONARY ARTERY BYPASS GRAFTING (CABG) x two, using left internal mammary artery and right leg greater saphenous vein harvested endoscopically (N/A) MAZE (N/A) TRANSESOPHAGEAL ECHOCARDIOGRAM (TEE) (N/A) CLIPPING OF ATRIAL APPENDAGE  Subjective: On ventilator this morning, opens eyes during examination. Had a spontaneous PTX yesterday, CCM placed CT. Still requiring pressor support. +BM per nursing staff  Objective: Vital signs: BP Readings from Last 1 Encounters:  10/17/16 (!) 111/58   Pulse Readings from Last 1 Encounters:  10/17/16 88   Resp Readings from Last 1 Encounters:  10/17/16 (!) 24   Temp Readings from Last 1 Encounters:  10/17/16 98.8 F (37.1 C)    Hemodynamics: CVP:  [23 mmHg] 23 mmHg  Physical Exam:  Rhythm:   NSR at 90  Breath sounds: Diminished, improvement in coarse breath sounds from yesterday  Heart sounds:  RRR  Incisions:  Median sternotomy incision is clean, dry, and intact  Abdomen:  Firm, less distended than previous examinations  Extremities:  2+ pitting edema bilateral LE, SCI in place   Intake/Output from previous day: 10/04 0701 - 10/05 0700 In: 3474.2 [I.V.:4809.2; NG/GT:110; IV Piggyback:150] Out: 5035 [Urine:210; Chest Tube:850] Intake/Output this shift: Total I/O In: 243.2 [I.V.:243.2] Out: 252 [Urine:15; Other:237]  Lab Results:  CBC:  Recent Labs  10/16/16 2355 10/17/16 0335  WBC 15.2* 18.5*  HGB 8.4* 9.0*  HCT 23.6* 26.1*  PLT 178 210    BMET:   Recent Labs  10/16/16 1515 10/17/16 0335  NA 130*  131* 131*  K 4.9  4.9 3.7  CL 96*  97* 94*  CO2 18*  18* 23  GLUCOSE 126*  126* 132*  BUN 35*  35* 33*  CREATININE 2.98*  2.95* 2.41*  CALCIUM 7.3*  7.3* 6.7*     PT/INR:    Recent Labs  10/17/16 0335  LABPROT 51.1*  INR 5.70*      CBG (last 3)   Recent Labs  10/16/16 2320 10/17/16 0325 10/17/16 0759  GLUCAP 181* 130* 106*    ABG    Component Value Date/Time   PHART 7.434 10/17/2016 0325   PCO2ART 37.7 10/17/2016 0325   PO2ART 84.1 10/17/2016 0325   HCO3 24.8 10/17/2016 0325   TCO2 25 10/17/2016 0007   ACIDBASEDEF 1.0 10/17/2016 0007   O2SAT 53.4 10/17/2016 0340    CXR: PORTABLE CHEST 1 VIEW  COMPARISON:  04/20/2008  FINDINGS: Cardiac shadow is stable. Postsurgical changes are noted. Left subclavian and central line is noted and stable. Right subclavian central line is again seen coiled within the right innominate vein but stable. Endotracheal tube, nasogastric catheter and right-sided thoracostomy catheter are again seen and stable. Patchy infiltrates are again noted bilaterally and slightly improved when compared with the prior study. No pneumothorax is noted.  IMPRESSION: Tubes and lines as described.  Slight improvement in bilateral infiltrative change.   Electronically Signed   By: Inez Catalina M.D.   On: 10/17/2016 07:30   Assessment/Plan: S/P Procedure(s) (LRB): CORONARY ARTERY BYPASS GRAFTING (CABG) x two, using left internal mammary artery and right leg greater saphenous vein harvested endoscopically (N/A) MAZE (N/A) TRANSESOPHAGEAL ECHOCARDIOGRAM (TEE) (N/A) CLIPPING OF ATRIAL APPENDAGE  Cardiovascular - Hemodynamically stable. Currently in NSR Rhythm at 88bpmon 30 mg/hr  Amiodarone. BP 99/74on 0.3 mcg/Hr of Dopamine, 150 mcg/min of phenylephrine, and 0.03 mcg Vasopressin   INR - INR was critically high this morning at 5.41, aPTT was >200, and heparin level was 1.46. Pharmacy is following for this, heparin D/C'd and will continue to monitor daily INR  Pulmonary -SpO2 100% on on ventilation, which he required yesterday for HD line placement. CXR this morning shows some improvement in bilateral patchy infilitrates (? ARDS vs CHF). He will likely require  ventilation for a few days while receiving CRRT. Most recent ABG has normalized.   Pneumothorax - Yesterday the patient was found to have developed a spontaneous PTX. CT was placed by CCM. CT drained 850 in the last 24 hours.   Elevated WBC - WBC 18.5 this morning which is down from 19.1. Afebrile. There was some concern for possible sepsis yesterday vs SIRS. Started on Vancomycin and Zosyn. Sputum cultures are pending, gram staining revealed abundant WBCs but no organisms  Lactic Acidosis - Lactic acid 4.6 yesterday, may be due to possible sepsis vs hypoxia   Anemia- Expected post-operative. H&H 9.2and 27.6. Continue ferrous sulfate and monitor.   Fluid Overload- Expected post-operative. Patient is still about 12 kg above pre-operative weight. I/O +34.2in the last 24hours. Currently receiving CRRT, however, CCM recommends keeping patient even at this time due to concern for possible infection/sepsis. Nephrology following.  Renal Impairement -sCr improved to 2.41 this morning. Currently on CRRT however not pulling fluid because there was some concern for sepsis yesterday.  Nephrology following  Hypokalemia -K+ 3.7this morning. Supplementation ordered, Will need to closely monitor with renal impairment   Elevated LFTs - AST elevated to 807, ALT elevated to 414, Alk Phos elevated to 229. May be a component of MSOF, likely playing a role in INR elevation.   Post-operative Ileus - Per nursing staff, he was able to have a small BM yesterday. KUB shows scattered bowel gas particularly in the colon which could represent an ileus. Abdomen feels softer and less distended on examination this morning. Will continue to monitor this.   CBG -201/181/130 - Continue ACHS glucose checks and SSI PRN.   Continue Amiodarone, Dopamine, Vasopressin, and Neo for BP and HR control Heparin D/C'd due to critical values, pharmacy following Monitor INR Monitor H&H Continue Vancomycin and Zosyn, Monitor  WBC Continue CRRT, Nephrology following, still 11 kg above pre-operative weight. sCr continues to improve Monitor K+ and supplement Elevated LFTs, may be a factor of MSOF, may be playing a role in INR elevation. Continue to monitor  Edison Simon, PA-S 10/17/2016 8:52 AM   I have seen and examined the patient and agree with the assessment and plan as outlined above.  Multi-system organ failure w/ acute hypoxic respiratory failure due to acute on chronic diastolic CHF, ARDS and possible HCAP and right tension  PTX yesterday presumably due to barotrauma.  Stable overnight and O2 sats stable now on 80% FiO2 and PEEP=12, CXR actually looks slightly improved  Hemodynamics reasonably stable although requiring vasopressin and Neo for BP support, low dose dopamine for renal perfusion.  Maintaining NSR on amiodarone.  Acute oliguric renal failure complicating pre-existing CKD now on CVVHD.  Massive volume overload - need to start trying to pull volume if hemodynamics will tolerate.  Metabolic acidosis improving on bicarb drip, likely primarily due to renal failure but elevated lactate in setting of shock following tension PTX.  Will recheck.  Elevated liver enzymes likely due to transient shock and passive hepatic congestion.  Still  on amiodarone which may help to maintain sinus rhythm.  Continue to monitor and watch coags  Post op ileus w/ persistent abdominal distension and dilated loops of small bowel on KUB although some bowel sounds and + loose stools.  I still think it's too risky to try enteral feeds and favor starting TNA for now.  Watch for signs of mesenteric ischemia although patient has never had any tenderness on exam.  Elevated WBC w/out fever.  Hypothermia yesterday following period of shock assoc with tension PTX.  Now on Vanc and Zosyn for possible HCAP  Rexene Alberts, MD 10/17/2016 10:03 AM

## 2016-10-17 NOTE — Progress Notes (Signed)
Peripherally Inserted Central Catheter/Midline Placement  The IV Nurse has discussed with the patient and/or persons authorized to consent for the patient, the purpose of this procedure and the potential benefits and risks involved with this procedure.  The benefits include less needle sticks, lab draws from the catheter, and the patient may be discharged home with the catheter. Risks include, but not limited to, infection, bleeding, blood clot (thrombus formation), and puncture of an artery; nerve damage and irregular heartbeat and possibility to perform a PICC exchange if needed/ordered by physician.  Alternatives to this procedure were also discussed.  [employer] PICC patient education guide, fact sheet on infection prevention and patient information card has been provided to patient /or left at bedside.    PICC/Midline Placement Documentation    Attempted PICC placement in right upper arm. Unable to past subclavian vein. Catheter continued to flip up towards the left IJ. RN made aware.    [name] 10/17/2016, 3:13 PM

## 2016-10-17 NOTE — Progress Notes (Signed)
PT Cancellation Note  Patient Details Name: [name] MRN: [id number] DOB: 12-26-1930   Cancelled Treatment:    Reason Eval/Treat Not Completed: Medical issues which prohibited therapy PT will continue to follow acutely.    Salina April, PTA Pager: [phone]   10/17/2016, 8:30 AM

## 2016-10-17 NOTE — Progress Notes (Signed)
CRITICAL VALUE ALERT  Critical Value: PT 51.1, INR 5.70, PTT > 200  Date & Time Notied: 10/17/16 @  1694  Provider Notified: Dr. Servando Snare  Orders Received/Actions taken: Stop systemic heparin infusion   Legrand Como (pharmacist) paged Dr. Servando Snare about the critical values above - verbal order to d/c heparin gtt and touch base with renal MD during morning rounds about heparin via CRRT. Per protocol, heparin infusion via CRRT has been held at this time.   Will implement and continue to monitor pt closely.

## 2016-10-17 NOTE — Progress Notes (Signed)
[facility] Rounding Note  Subjective/Interval History:  Pt intubated 10/3, CRRT initiated 10/3 300/200/1000/all 4K fluids Heparin off Pressors Spontaneous PTX 10/4 new R chest tube Keeping even per CCM   Objective Vital signs in last 24 hours: Vitals:   10/17/16 1015 10/17/16 1030 10/17/16 1045 10/17/16 1100  BP: 131/62 123/64 (!) 132/101 116/60  Pulse: 91 87 86 91  Resp: (!) 24 (!) 24 (!) 24 (!) 24  Temp: 98.8 F (37.1 C) 98.8 F (37.1 C) 98.6 F (37 C) 98.6 F (37 C)  TempSrc:      SpO2: 100% 100% 100% 96%  Weight:      Height:       Weight change: 0.1 kg (3.5 oz)  Intake/Output Summary (Last 24 hours) at 10/17/16 1143 Last data filed at 10/17/16 1100  Gross per 24 hour  Intake          5802.09 ml  Output             5537 ml  Net           265.09 ml   Weight summary Date/Time  Weight                  10/17/16  111.6 kg   10/16/16   113 kg    10/15/16   112.5 kg  CRRT initiated  10/13/16   104.7 kg    10/12/16   105.4 kg    10/11/16   108.1 kg    10/10/16   106.9 kg    10/09/16   98.9 kg    10/09/16   98.9 kg      Physical Exam:  Blood pressure 116/60, pulse 91, temperature 98.6 F (37 C), resp. rate (!) 24, height 5' 8"  (1.727 m), weight 111.6 kg (246 lb 0.5 oz), SpO2 96 %.  Gen: Obese pale appearing WM Just had fentanyl bolus Left IJ trialysis catheter (10/3 CCM) New R chest tube (10/4) Cannot see neck veins at all d/t lines, etc BS's symmetric Distant heart sounds  S1S2 NO S3 Can't appreciate murmur Sternotomy incision dressed Abdomen distended/quiet No focal tenderness  Massive pitting edema of arms/hands 2+ pitting LE edema no real change Absent toe left foot/worse edema on that side Trickle of urine only   Recent Labs Lab 10/13/16 0341 10/14/16 0520 10/15/16 0433 10/15/16 1600 10/15/16 1610 10/16/16 0306 10/16/16 1515 10/17/16 0335  NA 131* 127* 125* 126*  --  129* 130*  131* 131*  K 3.0* 3.9 4.5 4.2  --  4.1 4.9  4.9 3.7  CL 96*  94* 88* 92*  --  96* 96*  97* 94*  CO2 23 23 22  19*  --  21* 18*  18* 23  GLUCOSE 92 105* 125* 144*  --  81 126*  126* 132*  BUN 39* 50* 55* 54*  --  44* 35*  35* 33*  CREATININE 3.41* 3.98* 4.29* 4.09*  --  3.34* 2.98*  2.95* 2.41*  CALCIUM 7.7* 7.5* 7.5* 7.1*  --  7.0* 7.3*  7.3* 6.7*  PHOS  --   --  7.3* 7.1* 7.0* 4.7* 5.2*  5.2* 3.2  3.0    Recent Labs Lab 10/14/16 0520  10/16/16 1515 10/17/16 0335 10/17/16 0642  AST 22  --  43*  --  807*  ALT 18  --  21  --  414*  ALKPHOS 75  --  170*  --  229*  BILITOT 0.9  --  1.2  --  1.3*  PROT 4.9*  --  5.1*  --  4.7*  ALBUMIN 2.4*  < > 2.1*  2.1* 1.9* 1.9*  < > = values in this interval not displayed.  Recent Labs Lab 10/16/16 0306 10/16/16 1515 10/16/16 2355 10/17/16 0335  WBC 18.8* 19.1* 15.2* 18.5*  HGB 9.2* 9.9* 8.4* 9.0*  HCT 27.6* 29.6* 23.6* 26.1*  MCV 87.3 87.6 84.9 84.5  PLT 225 208 178 210   Lab Results  Component Value Date   INR 5.70 (HH) 10/17/2016   INR 1.94 10/16/2016   INR 1.23 10/15/2016    Recent Labs Lab 10/16/16 1620 10/16/16 2105 10/16/16 2320 10/17/16 0325 10/17/16 0759  GLUCAP 150* 201* 181* 130* 106*     Iron/TIBC/Ferritin/ %Sat    Component Value Date/Time   IRON 8 (L) 10/15/2016 0433   TIBC 140 (L) 10/15/2016 0433   IRONPCTSAT 6 (L) 10/15/2016 0433   Studies/Results: Ct Abdomen Pelvis Wo Contrast  Result Date: 10/16/2016 CLINICAL DATA:  Acute abdominal pain, pancreatitis, elevated lipase. Followup shock. Cardiac surgery 1 week ago. EXAM: CT ABDOMEN AND PELVIS WITHOUT CONTRAST TECHNIQUE: Multidetector CT imaging of the abdomen and pelvis was performed following the standard protocol without IV contrast. COMPARISON:  Chest CT 09/25/2015 and abdominal films 10/16/2016 FINDINGS: Lower chest: Median sternotomy wires are present. There is a small caliber right-sided chest tube as tip is not visualized. Tiny anterior right basilar pneumothorax is present. Small bilateral pleural  effusions right greater than left with associated bibasilar atelectasis. Patchy bilateral airspace consolidation with air bronchograms likely multifocal pneumonia. Calcified plaque over the coronary arteries. Small percutaneous pericardial leads present. Hepatobiliary: Within normal. Pancreas: Within normal. Spleen: Within normal. Adrenals/Urinary Tract: Adrenal glands are within normal. Kidneys are normal in size without hydronephrosis or nephrolithiasis. Parapelvic right renal cyst. Suggestion of a exophytic 1.4 cm mass over the upper pole right kidney not well characterized on this noncontrast exam. Subcentimeter hypodensity over the mid upper pole cortex of the left kidney too small to characterize but likely cysts. Ureters are normal. Foley is present within a decompressed bladder. Stomach/Bowel: Nasogastric tube has tip over the mid body of the stomach. Appendix is normal. Rectosigmoid colon is decompressed. Remainder of the colon is within normal. Vascular/Lymphatic: Mild calcified plaque over the abdominal aorta. No adenopathy. Reproductive: Within normal. Other: Mild diffuse subcutaneous edema. Minimal free fluid in the pericolic gutters. No significant free peritoneal fluid or free air in the abdomen. Musculoskeletal: Mild of moderate degenerate change of the spine. Anterior wedging of L2 likely chronic mild degenerate change of the hips. IMPRESSION: Multifocal airspace consolidation with air bronchograms compatible with today multifocal pneumonia. Small effusions right greater than left with mild associated basilar atelectasis. Small anterior right basilar pneumothorax with small caliber right-sided chest tube in place. No acute findings in the abdomen/pelvis. Several bilateral renal cysts. Indeterminate 1.4 cm mass over the upper pole right kidney. Recommend follow-up pre and post-contrast CT on an elective basis for further evaluation. Mild anterior wedging of L2 likely chronic. Aortic Atherosclerosis  (ICD10-I70.0). Atherosclerotic coronary artery disease. Electronically Signed   By: Marin Olp M.D.   On: 10/16/2016 19:30   Dg Chest Portable 1 View  Result Date: 10/17/2016 CLINICAL DATA:  Respiratory failure EXAM: PORTABLE CHEST 1 VIEW COMPARISON:  04/20/2008 FINDINGS: Cardiac shadow is stable. Postsurgical changes are noted. Left subclavian and central line is noted and stable. Right subclavian central line is again seen coiled within the right innominate vein but stable. Endotracheal tube, nasogastric catheter and  right-sided thoracostomy catheter are again seen and stable. Patchy infiltrates are again noted bilaterally and slightly improved when compared with the prior study. No pneumothorax is noted. IMPRESSION: Tubes and lines as described. Slight improvement in bilateral infiltrative change. Electronically Signed   By: Inez Catalina M.D.   On: 10/17/2016 07:30   Dg Chest Port 1 View  Result Date: 10/16/2016 CLINICAL DATA:  Status post placement of a right subclavian venous catheter. EXAM: PORTABLE CHEST 1 VIEW COMPARISON:  Portable chest x-ray of October 16, 2016 FINDINGS: The subclavian venous catheter tip is oriented in a cephalad direction. There is no postprocedure pneumothorax. The fluffy alveolar opacities persist bilaterally. The right chest tube is in stable position. The dialysis catheter, endotracheal tube, and esophagogastric tubes are in stable position. IMPRESSION: The right subclavian venous catheter tip has re- curved upon itself and is oriented in a superior direction into the inferior aspect of the right internal jugular vein. Withdrawal and repositioning is needed. The chest and the other support apparatus appear stable. These results were called by telephone at the time of interpretation on 10/16/2016 at 4:43 pm to Fayrene Fearing, RN,, who verbally acknowledged these results. Electronically Signed   By: David  Martinique M.D.   On: 10/16/2016 16:46   Dg Chest Portable 1 View  Result  Date: 10/16/2016 CLINICAL DATA:  Right chest tube placement EXAM: PORTABLE CHEST 1 VIEW COMPARISON:  10/16/2016 1500 hours FINDINGS: Right sided pigtail chest tube has been placed which now projects over the right mid hemithorax. The right pneumothorax has resolved. Diffuse bilateral airspace disease is not significantly changed. Endotracheal and NG tubes are stable. Left subclavian dialysis catheter is stable. The heart is upper normal in size. No definite pleural effusion. IMPRESSION: Right chest tube placed and the right pneumothorax has resolved. Stable diffuse bilateral airspace disease. Electronically Signed   By: Marybelle Killings M.D.   On: 10/16/2016 16:17   Dg Chest Port 1 View  Result Date: 10/16/2016 CLINICAL DATA:  Respiratory distress EXAM: PORTABLE CHEST 1 VIEW COMPARISON:  October 16, 2016 study obtained earlier in the day FINDINGS: Endotracheal tube tip is 1.4 cm above the carina. Central catheter tip is in the superior vena cava. There is a large pneumothorax on the right with mild tension component. There is widespread alveolar opacity bilaterally, likely due to edema, possibly with a degree of underlying pneumonia and/or ARDS. There is cardiomegaly with pulmonary venous hypertension. Patient is status post coronary artery bypass grafting with left atrial appendage clamp present. There is a total shoulder replacement on left. There is carotid artery calcification on the left. There are surgical clips in the right cervical-thoracic junction. IMPRESSION: 1.  Large right-sided pneumothorax with tension component. 2.  Tube and catheter positions as described. 3. Widespread alveolar opacity, likely diffuse edema, potentially with underlying pneumonia and/ or ARDS. More than one of these entities may exist concurrently. 4. Evidence of underlying cardiomegaly with pulmonary venous hypertension. There is aortic atherosclerosis. There is left carotid artery calcification. Critical Value/emergent results were  called by telephone at the time of interpretation on 10/16/2016 at 3:38 pm to San Jetty, RN , who verbally acknowledged these results. She report to me that chest tube was being placed at the time of the call. Aortic Atherosclerosis (ICD10-I70.0). Electronically Signed   By: Lowella Grip III M.D.   On: 10/16/2016 15:38   Dg Chest Port 1 View  Result Date: 10/16/2016 CLINICAL DATA:  83 year old male with history of respiratory failure. EXAM: PORTABLE CHEST  1 VIEW COMPARISON:  Chest x-ray 10/15/2016. FINDINGS: An endotracheal tube is in place with tip 5.2 cm above the carina. A nasogastric tube is seen extending into the stomach, however, the tip of the nasogastric tube extends below the lower margin of the image. There is a right-sided internal jugular central venous catheter with tip terminating in the internal jugular vein. There is a left-sided subclavian Vas-Cath with tip terminating in the superior cavoatrial junction. Lung volumes are low. Worsening patchy multifocal interstitial and airspace disease throughout the lungs bilaterally concerning for worsening pulmonary edema. Small bilateral pleural effusions (right greater than left). Mild cardiomegaly. The patient is rotated to the right on today's exam, resulting in distortion of the mediastinal contours and reduced diagnostic sensitivity and specificity for mediastinal pathology. Status post median sternotomy for CABG. Left atrial appendage ligation clip also noted. IMPRESSION: 1. Support apparatus and postoperative changes, as above. 2. The appearance the chest suggests worsening congestive heart failure, as above. Electronically Signed   By: Vinnie Langton M.D.   On: 10/16/2016 07:45   Dg Chest Port 1 View  Result Date: 10/15/2016 CLINICAL DATA:  Status post placement of endotracheal tube and nasogastric tube. EXAM: PORTABLE CHEST 1 VIEW COMPARISON:  Portable chest x-ray of October 15, 2016 at 5:42 a.m. FINDINGS: The endotracheal tube tip  appears to lie at the level of the carina. The esophagogastric tube tip in proximal port project below the inferior margin of the image. The dual-lumen dialysis catheter tip projects over the junction of the middle and distal thirds of the SVC. The cardiac silhouette is enlarged. The pulmonary vascularity is mildly prominent. The lung bases exhibit increased density bilaterally with obscuration of the hemidiaphragm. There are post CABG changes with left atrial appendage clip placement. There is thoracic aortic atherosclerosis. IMPRESSION: The endotracheal tube appears to lie at the level of the carina. Withdrawal by 1-2 cm is recommended followed by portal chest x-ray using better better technique to achieve adequate penetration. Worsening bibasilar atelectasis or pneumonia. Probable right pleural effusion. Thoracic aortic atherosclerosis. Electronically Signed   By: David  Martinique M.D.   On: 10/15/2016 12:56   Dg Abd Portable 1v  Result Date: 10/17/2016 CLINICAL DATA:  Follow-up ileus EXAM: PORTABLE ABDOMEN - 1 VIEW COMPARISON:  10/16/2016 FINDINGS: Scattered large and small bowel gas is noted. No obstructive changes are seen. No free air is noted. Nasogastric catheter is noted within the stomach. Degenerative changes of lumbar spine are seen. IMPRESSION: Scattered bowel gas particularly within the colon which could represent an ileus. Correlation with physical exam is recommended. Electronically Signed   By: Inez Catalina M.D.   On: 10/17/2016 07:31   Dg Abd Portable 1v  Result Date: 10/15/2016 CLINICAL DATA:  The patient has undergone placement of an esophagogastric tube. EXAM: PORTABLE ABDOMEN - 1 VIEW COMPARISON:  None in PACs FINDINGS: The esophagogastric tube tip lies in the proximal gastric cardia. The proximal port lies at or above the GE junction. There are loops of moderately distended small bowel within the mid upper abdomen. IMPRESSION: High placement of the esophagogastric tube. Advancement by  10-20 cm is needed to assure that the proximal port remains below the GE junction. Distention of bowel loops most compatible with an ileus. Electronically Signed   By: David  Martinique M.D.   On: 10/15/2016 12:57   Dg Abd Portable 2v  Result Date: 10/16/2016 CLINICAL DATA:  Distended abdomen. EXAM: PORTABLE ABDOMEN - 2 VIEW COMPARISON:  Yesterday FINDINGS: Diffuse gaseous distention of colon  favoring colonic ileus. Although no distal colonic gas is seen there is no morphologic changes suggestive of a volvulus. Postoperative status also favors ileus. A nasogastric tube tip is over the proximal stomach and side port near the GE junction. No evidence of pneumatosis. No pneumoperitoneum on cross table lateral view. Chest reported separately. IMPRESSION: 1. Unchanged diffuse gaseous distention of colon. No distal colonic gas is seen but clinical circumstances favor adynamic ileus. No pneumoperitoneum or pneumatosis. 2. Nasogastric tube with side port over the GE junction and tip over the proximal stomach. Electronically Signed   By: Monte Fantasia M.D.   On: 10/16/2016 16:50   Medications: . sodium chloride    . sodium chloride    . amiodarone 30 mg/hr (10/17/16 0800)  . DOPamine 3 mcg/kg/min (10/17/16 0800)  . fentaNYL infusion INTRAVENOUS 25 mcg/hr (10/17/16 0800)  . ferumoxytol Stopped (10/15/16 1407)  . heparin 10,000 units/ 20 mL infusion syringe Stopped (10/17/16 0355)  . heparin    . phenylephrine (NEO-SYNEPHRINE) Adult infusion 130 mcg/min (10/17/16 0944)  . piperacillin-tazobactam (ZOSYN)  IV Stopped (10/17/16 1000)  . dialysis replacement fluid (prismasate) 300 mL/hr at 10/17/16 0105  . dialysis replacement fluid (prismasate) 200 mL/hr at 10/15/16 1330  . dialysate (PRISMASATE) 1,000 mL/hr at 10/17/16 0650  .  sodium bicarbonate  infusion 1000 mL 150 mL/hr at 10/17/16 0800  . vancomycin    . vasopressin (PITRESSIN) infusion - *FOR SHOCK* 0.03 Units/min (10/17/16 0800)   . aspirin  300 mg  Rectal Daily  . bisacodyl  10 mg Rectal Daily  . chlorhexidine gluconate (MEDLINE KIT)  15 mL Mouth Rinse BID  . Chlorhexidine Gluconate Cloth  6 each Topical Q0600  . feeding supplement (PRO-STAT SUGAR FREE 64)  30 mL Per Tube BID  . feeding supplement (VITAL HIGH PROTEIN)  1,000 mL Per Tube Q24H  . folic acid  1 mg Per Tube Daily  . guaiFENesin  1,200 mg Oral BID  . insulin aspart  0-24 Units Subcutaneous Q4H  . levothyroxine  25 mcg Per Tube QAC breakfast  . mouth rinse  15 mL Mouth Rinse 10 times per day  . metoprolol tartrate  25 mg Per Tube BID  . midazolam  2 mg Intravenous Once  . moving right along book   Does not apply Once  . pantoprazole sodium  40 mg Per Tube Daily  . patient's guide to using coumadin book   Does not apply Once  . rocuronium  50 mg Intravenous Once  . sodium chloride flush  10-40 mL Intracatheter Q12H  . sodium chloride flush  3 mL Intravenous Q12H  . sodium chloride flush  3 mL Intravenous Q12H    Background: 83 y.o. year-old with PMH DM, HTN, HLD, chronic dCHF, COPD, PAF, RA, secondary HPT. Known CKD3/4 (DM, HTN, prior NSAID use; followed by Dr. Marval Regal). Last creatinine at [facility] 2.61 08/28/16. Heart cath 10/07/16 90% LM ds. S/p 2V CABG, MAZE procedure, clipping of atrial appendage by Dr. Roxy Manns  10/09/16. Persistent hypotension with insidious worsening of renal function, UOP trailed off despite lasix drip.  We were asked to see for hemodynamically driven AKI on CKD4. CRRT initiated 10/3 2/2 massive overload/resp failure, AKI.   Assessment/Recommendations  1. AKI on CKD3/4 - post CABG and MAZE procedure. Steady rise in creatinine, no response to ceiling dose diuretics.  Hemodynamically driven (low blood pressures) AKI  in kidneys with little reserve.  1. Left IJ vas cath by CCM (10/3) 2. CRRT initiated 10/15/16 tolerating  fine. K and phos OK so far. Monitor for need for repletion.  3. Circuit heparin off d/t IV heparin 4. Current goal keeping  even (at most 50/hour remove volume as able) until settles out from hemodynamic instability of the past 24 hours ( 13 kg over pre op weight)  2. S/P CABG/MAZE procedure - ECHO 10/2;  no pericardial effusion. 3. Acute hypoxic resp failure. Pulm edema. 4. Shock - vaso/neo/dopamine.  1. Sig hypotension yesterday associated with spontaneous PTX.  2. CCM worried about brewing sepsis.  3. ATB's (v/z) for possible HCAP 5. Anemia - ABLA. No prior significant CKD related anemia no outpt ESA requirement 1. Transfuse for Hb <7 2. Dosed Aranesp 150 10/2, then weekly.  3. Feraheme dosed 10/3, redose 10/10. (tsat 6%) 6. Diabetes with neuropathy 1. Stopped gabapentin (early side effects from accumulation) 7. Hyponatremia - fluid overload. Improving with CRRT   Jamal Maes, MD [facility] [phone] pager 10/17/2016, 11:43 AM

## 2016-10-17 NOTE — Progress Notes (Signed)
PULMONARY / CRITICAL CARE MEDICINE   Name: [name] MRN: [id number] DOB: 1930/12/16    ADMISSION DATE:  10/09/2016 CONSULTATION DATE: 10/15/2016  REFERRING MD:  Roxy Manns  CHIEF COMPLAINT:  Hypoxemic respiratory failrue  HISTORY OF PRESENT ILLNESS:   83 y/o male underwent a CABG and MAZE on 9/27.  Developed worsening renal failure post operatively requiring dialysis, needed intubation for catheter placement.   On 10/3 he developed shock in the setting of a spontaneous R sided pneumothorax.     SUBJECTIVE:  Spontaneous R pneumothorax yesterday, chest tube placed Shock in setting of pneumothorax Remains on CVVHD Following commands  VITAL SIGNS: BP (!) 132/101   Pulse 86   Temp 98.6 F (37 C)   Resp (!) 24   Ht 5\' 8"  (1.727 m)   Wt 246 lb 0.5 oz (111.6 kg)   SpO2 100%   BMI 37.41 kg/m   HEMODYNAMICS: CVP:  [23 mmHg] 23 mmHg  VENTILATOR SETTINGS: Vent Mode: PRVC FiO2 (%):  [80 %-100 %] 80 % Set Rate:  [16 bmp-24 bmp] 24 bmp Vt Set:  [660 mL-680 mL] 660 mL PEEP:  [5 cmH20-12 cmH20] 12 cmH20 Plateau Pressure:  [22 cmH20-40 cmH20] 40 cmH20  INTAKE / OUTPUT: I/O last 3 completed shifts: In: 6197.1 [I.V.:5867.1; NG/GT:180; IV Piggyback:150] Out: 8185 [Urine:390; UDJSH:7026; Chest Tube:850]  PHYSICAL EXAMINATION:  General:  In bed on vent HENT: NCAT ETT in place PULM: rhonchi B, vent supported breathing CV: RRR, no mgr GI: BS+, soft, nontender MSK: normal bulk and tone Neuro: sedated on vent    LABS:  BMET  Recent Labs Lab 10/16/16 0306 10/16/16 1515 10/17/16 0335  NA 129* 130*  131* 131*  K 4.1 4.9  4.9 3.7  CL 96* 96*  97* 94*  CO2 21* 18*  18* 23  BUN 44* 35*  35* 33*  CREATININE 3.34* 2.98*  2.95* 2.41*  GLUCOSE 81 126*  126* 132*    Electrolytes  Recent Labs Lab 10/16/16 0306 10/16/16 1515 10/17/16 0335  CALCIUM 7.0* 7.3*  7.3* 6.7*  MG 2.2 2.5* 2.3  PHOS 4.7* 5.2*  5.2* 3.2  3.0    CBC  Recent Labs Lab 10/16/16 1515  10/16/16 2355 10/17/16 0335  WBC 19.1* 15.2* 18.5*  HGB 9.9* 8.4* 9.0*  HCT 29.6* 23.6* 26.1*  PLT 208 178 210    Coag's  Recent Labs Lab 10/15/16 0433 10/16/16 0306 10/17/16 0335  APTT  --  132* >200*  INR 1.23 1.94 5.70*    Sepsis Markers  Recent Labs Lab 10/16/16 1515  LATICACIDVEN 4.6*    ABG  Recent Labs Lab 10/16/16 1615 10/17/16 0007 10/17/16 0325  PHART 7.338* 7.423 7.434  PCO2ART 36.9 35.4 37.7  PO2ART 59.0* 61.0* 84.1    Liver Enzymes  Recent Labs Lab 10/14/16 0520  10/16/16 1515 10/17/16 0335 10/17/16 0642  AST 22  --  43*  --  807*  ALT 18  --  21  --  414*  ALKPHOS 75  --  170*  --  229*  BILITOT 0.9  --  1.2  --  1.3*  ALBUMIN 2.4*  < > 2.1*  2.1* 1.9* 1.9*  < > = values in this interval not displayed.  Cardiac Enzymes No results for input(s): TROPONINI, PROBNP in the last 168 hours.  Glucose  Recent Labs Lab 10/16/16 1143 10/16/16 1620 10/16/16 2105 10/16/16 2320 10/17/16 0325 10/17/16 0759  GLUCAP 94 150* 201* 181* 130* 106*    Imaging Ct Abdomen  Pelvis Wo Contrast  Result Date: 10/16/2016 CLINICAL DATA:  Acute abdominal pain, pancreatitis, elevated lipase. Followup shock. Cardiac surgery 1 week ago. EXAM: CT ABDOMEN AND PELVIS WITHOUT CONTRAST TECHNIQUE: Multidetector CT imaging of the abdomen and pelvis was performed following the standard protocol without IV contrast. COMPARISON:  Chest CT 09/25/2015 and abdominal films 10/16/2016 FINDINGS: Lower chest: Median sternotomy wires are present. There is a small caliber right-sided chest tube as tip is not visualized. Tiny anterior right basilar pneumothorax is present. Small bilateral pleural effusions right greater than left with associated bibasilar atelectasis. Patchy bilateral airspace consolidation with air bronchograms likely multifocal pneumonia. Calcified plaque over the coronary arteries. Small percutaneous pericardial leads present. Hepatobiliary: Within normal.  Pancreas: Within normal. Spleen: Within normal. Adrenals/Urinary Tract: Adrenal glands are within normal. Kidneys are normal in size without hydronephrosis or nephrolithiasis. Parapelvic right renal cyst. Suggestion of a exophytic 1.4 cm mass over the upper pole right kidney not well characterized on this noncontrast exam. Subcentimeter hypodensity over the mid upper pole cortex of the left kidney too small to characterize but likely cysts. Ureters are normal. Foley is present within a decompressed bladder. Stomach/Bowel: Nasogastric tube has tip over the mid body of the stomach. Appendix is normal. Rectosigmoid colon is decompressed. Remainder of the colon is within normal. Vascular/Lymphatic: Mild calcified plaque over the abdominal aorta. No adenopathy. Reproductive: Within normal. Other: Mild diffuse subcutaneous edema. Minimal free fluid in the pericolic gutters. No significant free peritoneal fluid or free air in the abdomen. Musculoskeletal: Mild of moderate degenerate change of the spine. Anterior wedging of L2 likely chronic mild degenerate change of the hips. IMPRESSION: Multifocal airspace consolidation with air bronchograms compatible with today multifocal pneumonia. Small effusions right greater than left with mild associated basilar atelectasis. Small anterior right basilar pneumothorax with small caliber right-sided chest tube in place. No acute findings in the abdomen/pelvis. Several bilateral renal cysts. Indeterminate 1.4 cm mass over the upper pole right kidney. Recommend follow-up pre and post-contrast CT on an elective basis for further evaluation. Mild anterior wedging of L2 likely chronic. Aortic Atherosclerosis (ICD10-I70.0). Atherosclerotic coronary artery disease. Electronically Signed   By: Marin Olp M.D.   On: 10/16/2016 19:30   Dg Chest Portable 1 View  Result Date: 10/17/2016 CLINICAL DATA:  Respiratory failure EXAM: PORTABLE CHEST 1 VIEW COMPARISON:  04/20/2008 FINDINGS:  Cardiac shadow is stable. Postsurgical changes are noted. Left subclavian and central line is noted and stable. Right subclavian central line is again seen coiled within the right innominate vein but stable. Endotracheal tube, nasogastric catheter and right-sided thoracostomy catheter are again seen and stable. Patchy infiltrates are again noted bilaterally and slightly improved when compared with the prior study. No pneumothorax is noted. IMPRESSION: Tubes and lines as described. Slight improvement in bilateral infiltrative change. Electronically Signed   By: Inez Catalina M.D.   On: 10/17/2016 07:30   Dg Chest Port 1 View  Result Date: 10/16/2016 CLINICAL DATA:  Status post placement of a right subclavian venous catheter. EXAM: PORTABLE CHEST 1 VIEW COMPARISON:  Portable chest x-ray of October 16, 2016 FINDINGS: The subclavian venous catheter tip is oriented in a cephalad direction. There is no postprocedure pneumothorax. The fluffy alveolar opacities persist bilaterally. The right chest tube is in stable position. The dialysis catheter, endotracheal tube, and esophagogastric tubes are in stable position. IMPRESSION: The right subclavian venous catheter tip has re- curved upon itself and is oriented in a superior direction into the inferior aspect of the right internal  jugular vein. Withdrawal and repositioning is needed. The chest and the other support apparatus appear stable. These results were called by telephone at the time of interpretation on 10/16/2016 at 4:43 pm to Fayrene Fearing, RN,, who verbally acknowledged these results. Electronically Signed   By: David  Martinique M.D.   On: 10/16/2016 16:46   Dg Chest Portable 1 View  Result Date: 10/16/2016 CLINICAL DATA:  Right chest tube placement EXAM: PORTABLE CHEST 1 VIEW COMPARISON:  10/16/2016 1500 hours FINDINGS: Right sided pigtail chest tube has been placed which now projects over the right mid hemithorax. The right pneumothorax has resolved. Diffuse  bilateral airspace disease is not significantly changed. Endotracheal and NG tubes are stable. Left subclavian dialysis catheter is stable. The heart is upper normal in size. No definite pleural effusion. IMPRESSION: Right chest tube placed and the right pneumothorax has resolved. Stable diffuse bilateral airspace disease. Electronically Signed   By: Marybelle Killings M.D.   On: 10/16/2016 16:17   Dg Chest Port 1 View  Result Date: 10/16/2016 CLINICAL DATA:  Respiratory distress EXAM: PORTABLE CHEST 1 VIEW COMPARISON:  October 16, 2016 study obtained earlier in the day FINDINGS: Endotracheal tube tip is 1.4 cm above the carina. Central catheter tip is in the superior vena cava. There is a large pneumothorax on the right with mild tension component. There is widespread alveolar opacity bilaterally, likely due to edema, possibly with a degree of underlying pneumonia and/or ARDS. There is cardiomegaly with pulmonary venous hypertension. Patient is status post coronary artery bypass grafting with left atrial appendage clamp present. There is a total shoulder replacement on left. There is carotid artery calcification on the left. There are surgical clips in the right cervical-thoracic junction. IMPRESSION: 1.  Large right-sided pneumothorax with tension component. 2.  Tube and catheter positions as described. 3. Widespread alveolar opacity, likely diffuse edema, potentially with underlying pneumonia and/ or ARDS. More than one of these entities may exist concurrently. 4. Evidence of underlying cardiomegaly with pulmonary venous hypertension. There is aortic atherosclerosis. There is left carotid artery calcification. Critical Value/emergent results were called by telephone at the time of interpretation on 10/16/2016 at 3:38 pm to San Jetty, RN , who verbally acknowledged these results. She report to me that chest tube was being placed at the time of the call. Aortic Atherosclerosis (ICD10-I70.0). Electronically Signed    By: Lowella Grip III M.D.   On: 10/16/2016 15:38   Dg Abd Portable 1v  Result Date: 10/17/2016 CLINICAL DATA:  Follow-up ileus EXAM: PORTABLE ABDOMEN - 1 VIEW COMPARISON:  10/16/2016 FINDINGS: Scattered large and small bowel gas is noted. No obstructive changes are seen. No free air is noted. Nasogastric catheter is noted within the stomach. Degenerative changes of lumbar spine are seen. IMPRESSION: Scattered bowel gas particularly within the colon which could represent an ileus. Correlation with physical exam is recommended. Electronically Signed   By: Inez Catalina M.D.   On: 10/17/2016 07:31   Dg Abd Portable 2v  Result Date: 10/16/2016 CLINICAL DATA:  Distended abdomen. EXAM: PORTABLE ABDOMEN - 2 VIEW COMPARISON:  Yesterday FINDINGS: Diffuse gaseous distention of colon favoring colonic ileus. Although no distal colonic gas is seen there is no morphologic changes suggestive of a volvulus. Postoperative status also favors ileus. A nasogastric tube tip is over the proximal stomach and side port near the GE junction. No evidence of pneumatosis. No pneumoperitoneum on cross table lateral view. Chest reported separately. IMPRESSION: 1. Unchanged diffuse gaseous distention of colon. No distal  colonic gas is seen but clinical circumstances favor adynamic ileus. No pneumoperitoneum or pneumatosis. 2. Nasogastric tube with side port over the GE junction and tip over the proximal stomach. Electronically Signed   By: Monte Fantasia M.D.   On: 10/16/2016 16:50     STUDIES:  CXR with pulmonary edema 10/3  CULTURES: None  ANTIBIOTICS: None  SIGNIFICANT EVENTS: 10/3 hypoxemic respiratory failure requiring intubation  LINES/TUBES: ETT 10/3>>> L [place] Trialysis catheter 10/3>>> L radial a-line 10/3>>> R IJ single lumen catheter 10/3>>> 10/4 R subclavian CVL 10/4 >  R pigtail chest drain 10/4 >   DISCUSSION: 83 y/o male s/p CABG, MAZE procedure here with persistent acute respiratory failure  with hypoxemia in the setting of renal failure requiring HD and persistent bilateral pulmonary infiltrates.    ASSESSMENT / PLAN:  PULMONARY A: Acute respiratory failure with hypoxemia Bilateral pulmonary infiltrates HCAP Spontaneous pneumothorax on R 10/4 Acute pulmonary edema ? ARDS P:   Full mechanical vent support, titrate PEEP/FiO2 to maintain O2 saturation > 90% VAP prevention Daily WUA/SBT Chest tube continue suction  CARDIOVASCULAR A:  CAD, s/p CABG Afib, s/p MAZE Shock 10/3 in setting of pneumothorax P:  Tele Continue neo, dopamin, vasopressin titrated to MAP > 65 Amiodarone to continue Remove misplaced subclavian line, place PICC  RENAL A:   Hyponatermia AKI Anasarca P:   Monitor BMET and UOP Replace electrolytes as needed Continue volume removal with CVVHD  GASTROINTESTINAL A:   Ileus Shock liver P:   Continue to hold tube feedings, monitor abdominal exam Likely start trickle feeds over weekend if exam improves Pantoprazole for stress ulcer prophylaxis Trend LFTs G tube: Intermittent low wall suction  HEMATOLOGIC A:   Coagulopathy in setting of shock liver P:  Hold heparin for now Change CVVHD to citrate?  INFECTIOUS A:   HCAP likely  P:   F/u trach aspirate culture Continue vanc/zosn May need bronch if culture negative  ENDOCRINE A:   DM2 P:   Monitor glucose, SSI  NEUROLOGIC A:   No acute issues P:   RASS goal: -2 Continue fentanyl infusion given high vent settings   FAMILY  - Updates: none bedside  - Inter-disciplinary family meet or Palliative Care meeting due by:  day 7   My cc time 35 minutes  Roselie Awkward, MD [place] [facility] Pager: [phone] Cell: [phone] After 3pm or if no response, call [phone]   10/17/2016, 11:08 AM

## 2016-10-18 ENCOUNTER — Inpatient Hospital Stay (HOSPITAL_COMMUNITY): Payer: Medicare Other

## 2016-10-18 DIAGNOSIS — I480 Paroxysmal atrial fibrillation: Secondary | ICD-10-CM

## 2016-10-18 LAB — RENAL FUNCTION PANEL
ALBUMIN: 1.9 g/dL — AB (ref 3.5–5.0)
ALBUMIN: 1.9 g/dL — AB (ref 3.5–5.0)
ANION GAP: 12 (ref 5–15)
ANION GAP: 15 (ref 5–15)
BUN: 23 mg/dL — AB (ref 6–20)
BUN: 24 mg/dL — ABNORMAL HIGH (ref 6–20)
CALCIUM: 7.5 mg/dL — AB (ref 8.9–10.3)
CHLORIDE: 96 mmol/L — AB (ref 101–111)
CO2: 28 mmol/L (ref 22–32)
CO2: 25 mmol/L (ref 22–32)
CREATININE: 1.87 mg/dL — AB (ref 0.61–1.24)
Calcium: 7.4 mg/dL — ABNORMAL LOW (ref 8.9–10.3)
Chloride: 95 mmol/L — ABNORMAL LOW (ref 101–111)
Creatinine, Ser: 1.83 mg/dL — ABNORMAL HIGH (ref 0.61–1.24)
GFR calc Af Amer: 37 mL/min — ABNORMAL LOW (ref >60)
GFR calc non Af Amer: 32 mL/min — ABNORMAL LOW (ref >60)
GFR, EST AFRICAN AMERICAN: 38 mL/min — AB (ref >60)
GFR, EST NON AFRICAN AMERICAN: 32 mL/min — AB (ref >60)
GLUCOSE: 135 mg/dL — AB (ref 65–99)
Glucose, Bld: 106 mg/dL — ABNORMAL HIGH (ref 65–99)
PHOSPHORUS: 2 mg/dL — AB (ref 2.5–4.6)
PHOSPHORUS: 2.9 mg/dL (ref 2.5–4.6)
POTASSIUM: 3.6 mmol/L (ref 3.5–5.1)
Potassium: 3.7 mmol/L (ref 3.5–5.1)
SODIUM: 135 mmol/L (ref 135–145)
Sodium: 136 mmol/L (ref 135–145)

## 2016-10-18 LAB — GLUCOSE, CAPILLARY
GLUCOSE-CAPILLARY: 101 mg/dL — AB (ref 65–99)
GLUCOSE-CAPILLARY: 142 mg/dL — AB (ref 65–99)
Glucose-Capillary: 100 mg/dL — ABNORMAL HIGH (ref 65–99)
Glucose-Capillary: 103 mg/dL — ABNORMAL HIGH (ref 65–99)
Glucose-Capillary: 161 mg/dL — ABNORMAL HIGH (ref 65–99)

## 2016-10-18 LAB — COOXEMETRY PANEL
CARBOXYHEMOGLOBIN: 0.9 % (ref 0.5–1.5)
Carboxyhemoglobin: 0.8 % (ref 0.5–1.5)
METHEMOGLOBIN: 0.9 % (ref 0.0–1.5)
Methemoglobin: 1.3 % (ref 0.0–1.5)
O2 SAT: 46.2 %
O2 Saturation: 81.4 %
TOTAL HEMOGLOBIN: 9.5 g/dL — AB (ref 12.0–16.0)
Total hemoglobin: 8.8 g/dL — ABNORMAL LOW (ref 12.0–16.0)

## 2016-10-18 LAB — COMPREHENSIVE METABOLIC PANEL
ALT: 450 U/L — ABNORMAL HIGH (ref 17–63)
ANION GAP: 13 (ref 5–15)
AST: 608 U/L — ABNORMAL HIGH (ref 15–41)
Albumin: 2 g/dL — ABNORMAL LOW (ref 3.5–5.0)
Alkaline Phosphatase: 309 U/L — ABNORMAL HIGH (ref 38–126)
BUN: 24 mg/dL — ABNORMAL HIGH (ref 6–20)
CHLORIDE: 95 mmol/L — AB (ref 101–111)
CO2: 28 mmol/L (ref 22–32)
CREATININE: 1.87 mg/dL — AB (ref 0.61–1.24)
Calcium: 7.2 mg/dL — ABNORMAL LOW (ref 8.9–10.3)
GFR, EST AFRICAN AMERICAN: 37 mL/min — AB (ref >60)
GFR, EST NON AFRICAN AMERICAN: 32 mL/min — AB (ref >60)
Glucose, Bld: 102 mg/dL — ABNORMAL HIGH (ref 65–99)
POTASSIUM: 3.6 mmol/L (ref 3.5–5.1)
SODIUM: 136 mmol/L (ref 135–145)
Total Bilirubin: 1.3 mg/dL — ABNORMAL HIGH (ref 0.3–1.2)
Total Protein: 5.3 g/dL — ABNORMAL LOW (ref 6.5–8.1)

## 2016-10-18 LAB — BLOOD GAS, ARTERIAL
Acid-Base Excess: 6.4 mmol/L — ABNORMAL HIGH (ref 0.0–2.0)
Bicarbonate: 30 mmol/L — ABNORMAL HIGH (ref 20.0–28.0)
FIO2: 0.8
MECHVT: 660 mL
O2 Saturation: 96.9 %
PEEP: 12 cmH2O
Patient temperature: 98.6
RATE: 24 resp/min
pCO2 arterial: 39.8 mmHg (ref 32.0–48.0)
pH, Arterial: 7.489 — ABNORMAL HIGH (ref 7.350–7.450)
pO2, Arterial: 93.2 mmHg (ref 83.0–108.0)

## 2016-10-18 LAB — CBC
HCT: 27.3 % — ABNORMAL LOW (ref 39.0–52.0)
Hemoglobin: 9.3 g/dL — ABNORMAL LOW (ref 13.0–17.0)
MCH: 29 pg (ref 26.0–34.0)
MCHC: 34.1 g/dL (ref 30.0–36.0)
MCV: 85 fL (ref 78.0–100.0)
Platelets: 222 10*3/uL (ref 150–400)
RBC: 3.21 MIL/uL — ABNORMAL LOW (ref 4.22–5.81)
RDW: 15.1 % (ref 11.5–15.5)
WBC: 19.5 10*3/uL — ABNORMAL HIGH (ref 4.0–10.5)

## 2016-10-18 LAB — POCT I-STAT, CHEM 8
BUN: 26 mg/dL — AB (ref 6–20)
CALCIUM ION: 0.92 mmol/L — AB (ref 1.15–1.40)
CHLORIDE: 94 mmol/L — AB (ref 101–111)
CREATININE: 1.7 mg/dL — AB (ref 0.61–1.24)
Glucose, Bld: 142 mg/dL — ABNORMAL HIGH (ref 65–99)
HCT: 27 % — ABNORMAL LOW (ref 39.0–52.0)
Hemoglobin: 9.2 g/dL — ABNORMAL LOW (ref 13.0–17.0)
Potassium: 3.8 mmol/L (ref 3.5–5.1)
SODIUM: 134 mmol/L — AB (ref 135–145)
TCO2: 28 mmol/L (ref 22–32)

## 2016-10-18 LAB — POCT ACTIVATED CLOTTING TIME
ACTIVATED CLOTTING TIME: 208 s
ACTIVATED CLOTTING TIME: 191 s
Activated Clotting Time: 202 seconds
Activated Clotting Time: 213 seconds
Activated Clotting Time: 197 seconds
Activated Clotting Time: 362 seconds

## 2016-10-18 LAB — PROTIME-INR
INR: 3.5
PROTHROMBIN TIME: 34.8 s — AB (ref 11.4–15.2)

## 2016-10-18 LAB — MAGNESIUM: Magnesium: 2.2 mg/dL (ref 1.7–2.4)

## 2016-10-18 LAB — APTT: APTT: 59 s — AB (ref 24–36)

## 2016-10-18 MED ORDER — DEXTROSE 5 % IV SOLN
20.0000 mmol | Freq: Once | INTRAVENOUS | Status: AC
  Administered 2016-10-18: 20 mmol via INTRAVENOUS
  Filled 2016-10-18: qty 6.67

## 2016-10-18 MED ORDER — METOCLOPRAMIDE HCL 5 MG/ML IJ SOLN
10.0000 mg | Freq: Four times a day (QID) | INTRAMUSCULAR | Status: DC
  Administered 2016-10-18 – 2016-10-20 (×10): 10 mg via INTRAVENOUS
  Filled 2016-10-18 (×11): qty 2

## 2016-10-18 MED ORDER — PANTOPRAZOLE SODIUM 40 MG IV SOLR
40.0000 mg | INTRAVENOUS | Status: DC
  Administered 2016-10-18 – 2016-10-21 (×4): 40 mg via INTRAVENOUS
  Filled 2016-10-18 (×4): qty 40

## 2016-10-18 NOTE — Progress Notes (Signed)
VT decreased to 540 ml to match 8cc per IBW. Patient peak pressures now under 40 cm/H2O currently at 38 cm?H2O. FIO2 decreased to 60% down from 80%. Will continue to monitor.

## 2016-10-18 NOTE — Progress Notes (Signed)
9 Days Post-Op Procedure(s) (LRB): CORONARY ARTERY BYPASS GRAFTING (CABG) x two, using left internal mammary artery and right leg greater saphenous vein harvested endoscopically (N/A) MAZE (N/A) TRANSESOPHAGEAL ECHOCARDIOGRAM (TEE) (N/A) CLIPPING OF ATRIAL APPENDAGE Subjective: Generally improved with CRT fluid removal, cvp 15 Rate controlled afib Neuro ontact on wakeup assessment 8 peep. Fi02 .60, CXR clearing edema Objective: Vital signs in last 24 hours: Temp:  [97.2 F (36.2 C)-99.1 F (37.3 C)] 98.6 F (37 C) (10/06 0900) Pulse Rate:  [86-113] 87 (10/06 0900) Cardiac Rhythm: Atrial fibrillation (10/06 0800) Resp:  [17-25] 24 (10/06 0900) BP: (87-134)/(45-114) 122/74 (10/06 0900) SpO2:  [91 %-100 %] 100 % (10/06 0900) Arterial Line BP: (97-156)/(48-74) 147/65 (10/06 0900) FiO2 (%):  [60 %-80 %] 60 % (10/06 0900) Weight:  [242 lb 4.6 oz (109.9 kg)] 242 lb 4.6 oz (109.9 kg) (10/06 0500)  Hemodynamic parameters for last 24 hours: CVP:  [15 mmHg] 15 mmHg  Intake/Output from previous day: 10/05 0701 - 10/06 0700 In: 6173.9 [I.V.:5646.4; NG/GT:115; IV Piggyback:412.5] Out: 7964 [Urine:200; Emesis/NG output:200; Chest Tube:100] Intake/Output this shift: Total I/O In: 449.8 [I.V.:364.8; NG/GT:60; IV Piggyback:25] Out: 127 [Urine:24; Other:570]       Exam    General- sedated and comfortable on vent   Lungs- clear without rales, wheezes   Cor- afib  rhythm, no murmur , gallop   Abdomen- soft, non-tender   Extremities - warm, non-tender, moderate-severe edema   Neuro- oriented, appropriate, no focal weakness   Lab Results:  Recent Labs  10/17/16 0335 10/18/16 0400  WBC 18.5* 19.5*  HGB 9.0* 9.3*  HCT 26.1* 27.3*  PLT 210 222   BMET:  Recent Labs  10/17/16 1543 10/18/16 0400  NA 134* 136  K 3.7 3.6  CL 95* 95*  CO2 26 28  GLUCOSE 180* 102*  BUN 30* 24*  CREATININE 2.22* 1.87*  CALCIUM 6.9* 7.2*    PT/INR:  Recent Labs  10/18/16 0400  LABPROT 34.8*   INR 3.50   ABG    Component Value Date/Time   PHART 7.489 (H) 10/18/2016 0409   HCO3 30.0 (H) 10/18/2016 0409   TCO2 25 10/17/2016 0007   ACIDBASEDEF 1.0 10/17/2016 0007   O2SAT 46.2 10/18/2016 0420   CBG (last 3)   Recent Labs  10/17/16 2327 10/18/16 0429 10/18/16 0805  GLUCAP 135* 100* 101*    Assessment/Plan: S/P Procedure(s) (LRB): CORONARY ARTERY BYPASS GRAFTING (CABG) x two, using left internal mammary artery and right leg greater saphenous vein harvested endoscopically (N/A) MAZE (N/A) TRANSESOPHAGEAL ECHOCARDIOGRAM (TEE) (N/A) CLIPPING OF ATRIAL APPENDAGE cont current crepatient d/w CCM in room for coordination of care Ileus better- LFTs still up but improved- hold TF today  LOS: 9 days    [name] 10/18/2016

## 2016-10-18 NOTE — Progress Notes (Signed)
PULMONARY / CRITICAL CARE MEDICINE   Name: [name] MRN: [id number] DOB: 06-27-1930    ADMISSION DATE:  10/09/2016 CONSULTATION DATE: 10/15/2016  REFERRING MD:  Roxy Manns  CHIEF COMPLAINT:  Hypoxemic respiratory failrue  HISTORY OF PRESENT ILLNESS:   83 y/o male underwent a CABG and MAZE on 9/27.  Developed worsening renal failure post operatively requiring dialysis, needed intubation for catheter placement.   On 10/3 he developed shock in the setting of a spontaneous R sided pneumothorax.     SUBJECTIVE:  Oxygenation better Made some urine overnight TMax 99  VITAL SIGNS: BP 92/72 (BP Location: Right Arm)   Pulse 88   Temp 98.2 F (36.8 C) (Core (Comment))   Resp (!) 24   Ht 5\' 8"  (1.727 m)   Wt 242 lb 4.6 oz (109.9 kg)   SpO2 100%   BMI 36.84 kg/m   HEMODYNAMICS: CVP:  [15 mmHg] 15 mmHg  VENTILATOR SETTINGS: Vent Mode: PRVC FiO2 (%):  [60 %-80 %] 60 % Set Rate:  [24 bmp] 24 bmp Vt Set:  [540 mL-660 mL] 540 mL PEEP:  [12 cmH20] 12 cmH20 Plateau Pressure:  [23 cmH20-41 cmH20] 31 cmH20  INTAKE / OUTPUT: I/O last 3 completed shifts: In: 9516.3 [I.V.:8878.8; NG/GT:125; IV Piggyback:512.5] Out: 10932 [Urine:265; Emesis/NG output:200; TFTDD:2202; Chest Tube:450]  PHYSICAL EXAMINATION:  General:  In bed on vent HENT: NCAT ETT in place PULM: Rhonchi B, vent supported breathing CV: RRR, no mgr GI: BS+, soft, nontender MSK: normal bulk and tone Neuro: sedated on vent      LABS:  BMET  Recent Labs Lab 10/17/16 0335 10/17/16 1543 10/18/16 0400  NA 131* 134* 136  K 3.7 3.7 3.6  CL 94* 95* 95*  CO2 23 26 28   BUN 33* 30* 24*  CREATININE 2.41* 2.22* 1.87*  GLUCOSE 132* 180* 102*    Electrolytes  Recent Labs Lab 10/16/16 1515 10/17/16 0335 10/17/16 1543 10/18/16 0400  CALCIUM 7.3*  7.3* 6.7* 6.9* 7.2*  MG 2.5* 2.3  --  2.2  PHOS 5.2*  5.2* 3.2  3.0 2.2*  --     CBC  Recent Labs Lab 10/16/16 2355 10/17/16 0335 10/18/16 0400  WBC 15.2*  18.5* 19.5*  HGB 8.4* 9.0* 9.3*  HCT 23.6* 26.1* 27.3*  PLT 178 210 222    Coag's  Recent Labs Lab 10/16/16 0306 10/17/16 0335 10/18/16 0400  APTT 132* >200* 59*  INR 1.94 5.70* 3.50    Sepsis Markers  Recent Labs Lab 10/16/16 1515 10/17/16 1117 10/17/16 1501  LATICACIDVEN 4.6* 2.2* 2.1*    ABG  Recent Labs Lab 10/17/16 0007 10/17/16 0325 10/18/16 0409  PHART 7.423 7.434 7.489*  PCO2ART 35.4 37.7 39.8  PO2ART 61.0* 84.1 93.2    Liver Enzymes  Recent Labs Lab 10/16/16 1515  10/17/16 0642 10/17/16 1543 10/18/16 0400  AST 43*  --  807*  --  608*  ALT 21  --  414*  --  450*  ALKPHOS 170*  --  229*  --  309*  BILITOT 1.2  --  1.3*  --  1.3*  ALBUMIN 2.1*  2.1*  < > 1.9* 2.0* 2.0*  < > = values in this interval not displayed.  Cardiac Enzymes No results for input(s): TROPONINI, PROBNP in the last 168 hours.  Glucose  Recent Labs Lab 10/17/16 0325 10/17/16 0759 10/17/16 1136 10/17/16 1548 10/17/16 2327 10/18/16 0429  GLUCAP 130* 106* 147* 171* 135* 100*    Imaging Dg Chest [facility]  Result Date: 10/18/2016 CLINICAL DATA:  Followup exam. Intubated patient. Right-sided chest tube. Congestive heart failure. EXAM: PORTABLE CHEST 1 VIEW COMPARISON:  10/17/2016 FINDINGS: Interstitial and hazy airspace opacities in the lungs are without substantial change allowing for differences in lung volume and patient positioning. No new lung abnormalities. No convincing pleural effusion.  No pneumothorax. Right-sided chest tube is stable. Endotracheal tube, nasal/ orogastric tube, left subclavian dual lumen central venous catheter and right internal jugular catheter are all stable. IMPRESSION: 1. No pneumothorax. 2. Patchy areas of lung opacity are without significant change. This is consistent with persistent pulmonary edema. 3. Support apparatus including the right chest tube are stable. Electronically Signed   By: Lajean Manes M.D.   On: 10/18/2016 07:38   Dg  Chest Port 1 View  Result Date: 10/17/2016 CLINICAL DATA:  Central line placement EXAM: PORTABLE CHEST 1 VIEW COMPARISON:  10/18/2006 FINDINGS: Endotracheal tube tip is about 4.3 cm superior to the carina. Esophageal tube tip is below the diaphragm. Post sternotomy changes. Stable positioning of right-sided chest tube. Left subclavian central venous catheter tip overlies the venous confluence. Right subclavian central venous catheter tip is directed cephalad over the right innominate vein. New right IJ central venous catheter tip projects over venous confluence. No right pneumothorax is seen. Cardiomegaly with continued moderate to marked edema or infiltrates. Left shoulder replacement. IMPRESSION: 1. Right IJ central venous catheter tip overlies the venous confluence. No right pneumothorax. Similar appearance of right subclavian venous catheter with tip coiled upon itself over the brachiocephalic region. 2. Continued cardiomegaly and moderate-to-marked bilateral edema or infiltrates. Electronically Signed   By: Donavan Foil M.D.   On: 10/17/2016 17:14   Dg Abd Portable 1v  Result Date: 10/18/2016 CLINICAL DATA:  Followup adynamic ileus.  Orogastric tube. EXAM: PORTABLE ABDOMEN - 1 VIEW COMPARISON:  10/17/2016 FINDINGS: Nasal/ orogastric tube curls within the proximal stomach unchanged from the previous exam. Partly air-filled prominent loops of bowel are noted similar to the prior study. IMPRESSION: 1. Stable nasal/orogastric tube within the proximal stomach. 2. No change in the appearance of the bowel, well with prominent, partly air-filled, loops of bowel suggesting an adynamic ileus. Electronically Signed   By: Lajean Manes M.D.   On: 10/18/2016 07:40     STUDIES:  CXR with pulmonary edema 10/3  CULTURES: 10/4 resp culture > abundant WBC , recultured  ANTIBIOTICS: None  SIGNIFICANT EVENTS: 10/3 hypoxemic respiratory failure requiring intubation  LINES/TUBES: ETT 10/3>>> L [place]  Trialysis catheter 10/3>>> L radial a-line 10/3>>> R IJ single lumen catheter 10/3>>> 10/4 R subclavian CVL 10/4 > 10/5 R pigtail chest drain 10/4 >  R IJ CVL 10/5 >   DISCUSSION: 83 y/o male s/p CABG, MAZE procedure here with persistent acute respiratory failure with hypoxemia in the setting of renal failure requiring HD and persistent bilateral pulmonary infiltrates.    ASSESSMENT / PLAN:  PULMONARY A: Acute respiratory failure with hypoxemia Bilateral pulmonary infiltrates HCAP Spontaneous pneumothorax on R 10/4 Acute pulmonary edema ? ARDS 10/6 CXR images independently reviewed> improving but persistent bilateral airspace disease  P:   Wean FiO2 to 60, then wean PEEP today, goal 8 cm H20, keep SaO2 > 92% Continue volume removal  VAP prevention Continue full vent support  CARDIOVASCULAR A:  CAD, s/p CABG Afib, s/p MAZE Shock 10/3 in setting of pneumothorax P:  Tele Continue volume removal Cardiac meds per renal  RENAL A:   Hyponatermia AKI Anasarca P:   Monitor BMET and UOP Replace  electrolytes as needed Continue volume removal  GASTROINTESTINAL A:   Ileus Shock liver > improved slightly P:   g tube to low intermittent suction Hold tube feedings with bell distension, may need TNA on 10/7 if no improvement Repeat LFT  HEMATOLOGIC A:   Coagulopathy in setting of shock liver P:  Heparinization through CVVHD filter  Monitor for bleeding  INFECTIOUS A:   HCAP likely  P:   F/u trach aspirate Hold off on bronch unless he worsens Continue vanc/zosyn   ENDOCRINE A:   DM2 P:   Monitor glucose, SSI  NEUROLOGIC A:   No acute issues P:   Continue RASS -2 Continue fentanyl infusion   FAMILY  - Updates: wife and daughter updated beside  - Inter-disciplinary family meet or Palliative Care meeting due by:  day 7  My cc time 33 minutes  Roselie Awkward, MD [place] [facility] Pager: [phone] Cell: [phone] After 3pm or if no response, call  [phone]   10/18/2016, 9:18 AM

## 2016-10-18 NOTE — Progress Notes (Signed)
CT surgery p.m. Rounds  Excellent day Neo-Synephrine weaned off Will slowly wean vasopressin as tolerated CRT removing fluid P.m. labs satisfactory Rate controlled atrial fibrillation

## 2016-10-18 NOTE — Progress Notes (Addendum)
[facility] Rounding Note  Subjective/Interval History:  Pt intubated 10/3, CRRT initiated 10/3 300/200/1000/all 4K fluids Heparin off Spontaneous PTX 10/4 new R chest tube  Pulling 50-100 and weight starting to come down wome Neo down some   Objective Vital signs in last 24 hours: Vitals:   10/18/16 0615 10/18/16 0630 10/18/16 0645 10/18/16 0700  BP:    103/78  Pulse: (!) 105 86 86 87  Resp: (!) 24 (!) 24 (!) 24 (!) 24  Temp: (!) 97.2 F (36.2 C) (!) 97.3 F (36.3 C) (!) 97.3 F (36.3 C) (!) 97.5 F (36.4 C)  TempSrc:      SpO2: 100% 100% 98% 100%  Weight:      Height:       Weight change: -3.1 kg (-6 lb 13.4 oz)  Intake/Output Summary (Last 24 hours) at 10/18/16 0816 Last data filed at 10/18/16 0800  Gross per 24 hour  Intake           5918.2 ml  Output             8023 ml  Net          -2104.8 ml   Weight summary Date/Time  Weight             10/18/16  109.9 kg   10/17/16  111.6 kg   10/16/16   113 kg    10/15/16   112.5 kg  CRRT initiated  10/13/16   104.7 kg    10/12/16   105.4 kg    10/11/16   108.1 kg    10/10/16   106.9 kg    10/09/16   98.9 kg    10/09/16   98.9 kg      Physical Exam:  Blood pressure 103/78, pulse 87, temperature (!) 97.5 F (36.4 C), resp. rate (!) 24, height _0  (1.727 m), weight 109.9 kg (242 lb 4.6 oz), SpO2 100 %.  Gen: Obese pale appearing WM HOH/responds to yes/no questions per RN Left IJ trialysis catheter (10/3 CCM) no issues R chest tube (10/4) Cannot see neck veins at all d/t lines, etc BS's symmetric Distant heart sounds  S1S2 NO S3 Can't appreciate murmur Sternotomy incision dressed Abdomen distended/quiet No focal tenderness  Massive pitting edema of arms/hands down a little 2+ pitting LE edema possibly some better Absent toe left foot/worse edema on that side   Recent Labs Lab 10/15/16 0433 10/15/16 1600 10/15/16 1610 10/16/16 0306 10/16/16 1515 10/17/16 0335 10/17/16 1543 10/18/16 0400  NA 125* 126*   --  129* 130*  131* 131* 134* 136  K 4.5 4.2  --  4.1 4.9  4.9 3.7 3.7 3.6  CL 88* 92*  --  96* 96*  97* 94* 95* 95*  CO2 22 19*  --  21* 18*  18* _1 GLUCOSE 125* 144*  --  81 126*  126* 132* 180* 102*  BUN 55* 54*  --  44* 35*  35* 33* 30* 24*  CREATININE 4.29* 4.09*  --  3.34* 2.98*  2.95* 2.41* 2.22* 1.87*  CALCIUM 7.5* 7.1*  --  7.0* 7.3*  7.3* 6.7* 6.9* 7.2*  PHOS 7.3* 7.1* 7.0* 4.7* 5.2*  5.2* 3.2  3.0 2.2*  --     Recent Labs Lab 10/16/16 1515  10/17/16 0642 10/17/16 1543 10/18/16 0400  AST 43*  --  807*  --  608*  ALT 21  --  414*  --  450*  ALKPHOS 170*  --  229*  --  309*  BILITOT 1.2  --  1.3*  --  1.3*  PROT 5.1*  --  4.7*  --  5.3*  ALBUMIN 2.1*  2.1*  < > 1.9* 2.0* 2.0*  < > = values in this interval not displayed.  Recent Labs Lab 10/16/16 1515 10/16/16 2355 10/17/16 0335 10/18/16 0400  WBC 19.1* 15.2* 18.5* 19.5*  HGB 9.9* 8.4* 9.0* 9.3*  HCT 29.6* 23.6* 26.1* 27.3*  MCV 87.6 84.9 84.5 85.0  PLT 208 178 210 222   Lab Results  Component Value Date   INR 3.50 10/18/2016   INR 5.70 (HH) 10/17/2016   INR 1.94 10/16/2016    Recent Labs Lab 10/17/16 0759 10/17/16 1136 10/17/16 1548 10/17/16 2327 10/18/16 0429  GLUCAP 106* 147* 171* 135* 100*     Iron/TIBC/Ferritin/ %Sat    Component Value Date/Time   IRON 8 (L) 10/15/2016 0433   TIBC 140 (L) 10/15/2016 0433   IRONPCTSAT 6 (L) 10/15/2016 0433   Studies/Results: Ct Abdomen Pelvis Wo Contrast  Result Date: 10/16/2016 CLINICAL DATA:  Acute abdominal pain, pancreatitis, elevated lipase. Followup shock. Cardiac surgery 1 week ago. EXAM: CT ABDOMEN AND PELVIS WITHOUT CONTRAST TECHNIQUE: Multidetector CT imaging of the abdomen and pelvis was performed following the standard protocol without IV contrast. COMPARISON:  Chest CT 09/25/2015 and abdominal films 10/16/2016 FINDINGS: Lower chest: Median sternotomy wires are present. There is a small caliber right-sided chest tube as tip is  not visualized. Tiny anterior right basilar pneumothorax is present. Small bilateral pleural effusions right greater than left with associated bibasilar atelectasis. Patchy bilateral airspace consolidation with air bronchograms likely multifocal pneumonia. Calcified plaque over the coronary arteries. Small percutaneous pericardial leads present. Hepatobiliary: Within normal. Pancreas: Within normal. Spleen: Within normal. Adrenals/Urinary Tract: Adrenal glands are within normal. Kidneys are normal in size without hydronephrosis or nephrolithiasis. Parapelvic right renal cyst. Suggestion of a exophytic 1.4 cm mass over the upper pole right kidney not well characterized on this noncontrast exam. Subcentimeter hypodensity over the mid upper pole cortex of the left kidney too small to characterize but likely cysts. Ureters are normal. Foley is present within a decompressed bladder. Stomach/Bowel: Nasogastric tube has tip over the mid body of the stomach. Appendix is normal. Rectosigmoid colon is decompressed. Remainder of the colon is within normal. Vascular/Lymphatic: Mild calcified plaque over the abdominal aorta. No adenopathy. Reproductive: Within normal. Other: Mild diffuse subcutaneous edema. Minimal free fluid in the pericolic gutters. No significant free peritoneal fluid or free air in the abdomen. Musculoskeletal: Mild of moderate degenerate change of the spine. Anterior wedging of L2 likely chronic mild degenerate change of the hips. IMPRESSION: Multifocal airspace consolidation with air bronchograms compatible with today multifocal pneumonia. Small effusions right greater than left with mild associated basilar atelectasis. Small anterior right basilar pneumothorax with small caliber right-sided chest tube in place. No acute findings in the abdomen/pelvis. Several bilateral renal cysts. Indeterminate 1.4 cm mass over the upper pole right kidney. Recommend follow-up pre and post-contrast CT on an elective basis  for further evaluation. Mild anterior wedging of L2 likely chronic. Aortic Atherosclerosis (ICD10-I70.0). Atherosclerotic coronary artery disease. Electronically Signed   By: Marin Olp M.D.   On: 10/16/2016 19:30   Dg Chest Port 1 View  Result Date: 10/18/2016 CLINICAL DATA:  Followup exam. Intubated patient. Right-sided chest tube. Congestive heart failure. EXAM: PORTABLE CHEST 1 VIEW COMPARISON:  10/17/2016 FINDINGS: Interstitial and hazy airspace opacities in the lungs are without substantial change allowing for  differences in lung volume and patient positioning. No new lung abnormalities. No convincing pleural effusion.  No pneumothorax. Right-sided chest tube is stable. Endotracheal tube, nasal/ orogastric tube, left subclavian dual lumen central venous catheter and right internal jugular catheter are all stable. IMPRESSION: 1. No pneumothorax. 2. Patchy areas of lung opacity are without significant change. This is consistent with persistent pulmonary edema. 3. Support apparatus including the right chest tube are stable. Electronically Signed   By: Lajean Manes M.D.   On: 10/18/2016 07:38   Dg Chest Port 1 View  Result Date: 10/17/2016 CLINICAL DATA:  Central line placement EXAM: PORTABLE CHEST 1 VIEW COMPARISON:  10/18/2006 FINDINGS: Endotracheal tube tip is about 4.3 cm superior to the carina. Esophageal tube tip is below the diaphragm. Post sternotomy changes. Stable positioning of right-sided chest tube. Left subclavian central venous catheter tip overlies the venous confluence. Right subclavian central venous catheter tip is directed cephalad over the right innominate vein. New right IJ central venous catheter tip projects over venous confluence. No right pneumothorax is seen. Cardiomegaly with continued moderate to marked edema or infiltrates. Left shoulder replacement. IMPRESSION: 1. Right IJ central venous catheter tip overlies the venous confluence. No right pneumothorax. Similar  appearance of right subclavian venous catheter with tip coiled upon itself over the brachiocephalic region. 2. Continued cardiomegaly and moderate-to-marked bilateral edema or infiltrates. Electronically Signed   By: Donavan Foil M.D.   On: 10/17/2016 17:14   Dg Chest Portable 1 View  Result Date: 10/17/2016 CLINICAL DATA:  Respiratory failure EXAM: PORTABLE CHEST 1 VIEW COMPARISON:  04/20/2008 FINDINGS: Cardiac shadow is stable. Postsurgical changes are noted. Left subclavian and central line is noted and stable. Right subclavian central line is again seen coiled within the right innominate vein but stable. Endotracheal tube, nasogastric catheter and right-sided thoracostomy catheter are again seen and stable. Patchy infiltrates are again noted bilaterally and slightly improved when compared with the prior study. No pneumothorax is noted. IMPRESSION: Tubes and lines as described. Slight improvement in bilateral infiltrative change. Electronically Signed   By: Inez Catalina M.D.   On: 10/17/2016 07:30   Dg Chest Port 1 View  Result Date: 10/16/2016 CLINICAL DATA:  Status post placement of a right subclavian venous catheter. EXAM: PORTABLE CHEST 1 VIEW COMPARISON:  Portable chest x-ray of October 16, 2016 FINDINGS: The subclavian venous catheter tip is oriented in a cephalad direction. There is no postprocedure pneumothorax. The fluffy alveolar opacities persist bilaterally. The right chest tube is in stable position. The dialysis catheter, endotracheal tube, and esophagogastric tubes are in stable position. IMPRESSION: The right subclavian venous catheter tip has re- curved upon itself and is oriented in a superior direction into the inferior aspect of the right internal jugular vein. Withdrawal and repositioning is needed. The chest and the other support apparatus appear stable. These results were called by telephone at the time of interpretation on 10/16/2016 at 4:43 pm to Fayrene Fearing, RN,, who verbally  acknowledged these results. Electronically Signed   By: David  Martinique M.D.   On: 10/16/2016 16:46   Dg Chest Portable 1 View  Result Date: 10/16/2016 CLINICAL DATA:  Right chest tube placement EXAM: PORTABLE CHEST 1 VIEW COMPARISON:  10/16/2016 1500 hours FINDINGS: Right sided pigtail chest tube has been placed which now projects over the right mid hemithorax. The right pneumothorax has resolved. Diffuse bilateral airspace disease is not significantly changed. Endotracheal and NG tubes are stable. Left subclavian dialysis catheter is stable. The heart is upper  normal in size. No definite pleural effusion. IMPRESSION: Right chest tube placed and the right pneumothorax has resolved. Stable diffuse bilateral airspace disease. Electronically Signed   By: Marybelle Killings M.D.   On: 10/16/2016 16:17   Dg Chest Port 1 View  Result Date: 10/16/2016 CLINICAL DATA:  Respiratory distress EXAM: PORTABLE CHEST 1 VIEW COMPARISON:  October 16, 2016 study obtained earlier in the day FINDINGS: Endotracheal tube tip is 1.4 cm above the carina. Central catheter tip is in the superior vena cava. There is a large pneumothorax on the right with mild tension component. There is widespread alveolar opacity bilaterally, likely due to edema, possibly with a degree of underlying pneumonia and/or ARDS. There is cardiomegaly with pulmonary venous hypertension. Patient is status post coronary artery bypass grafting with left atrial appendage clamp present. There is a total shoulder replacement on left. There is carotid artery calcification on the left. There are surgical clips in the right cervical-thoracic junction. IMPRESSION: 1.  Large right-sided pneumothorax with tension component. 2.  Tube and catheter positions as described. 3. Widespread alveolar opacity, likely diffuse edema, potentially with underlying pneumonia and/ or ARDS. More than one of these entities may exist concurrently. 4. Evidence of underlying cardiomegaly with  pulmonary venous hypertension. There is aortic atherosclerosis. There is left carotid artery calcification. Critical Value/emergent results were called by telephone at the time of interpretation on 10/16/2016 at 3:38 pm to San Jetty, RN , who verbally acknowledged these results. She report to me that chest tube was being placed at the time of the call. Aortic Atherosclerosis (ICD10-I70.0). Electronically Signed   By: Lowella Grip III M.D.   On: 10/16/2016 15:38   Dg Abd Portable 1v  Result Date: 10/18/2016 CLINICAL DATA:  Followup adynamic ileus.  Orogastric tube. EXAM: PORTABLE ABDOMEN - 1 VIEW COMPARISON:  10/17/2016 FINDINGS: Nasal/ orogastric tube curls within the proximal stomach unchanged from the previous exam. Partly air-filled prominent loops of bowel are noted similar to the prior study. IMPRESSION: 1. Stable nasal/orogastric tube within the proximal stomach. 2. No change in the appearance of the bowel, well with prominent, partly air-filled, loops of bowel suggesting an adynamic ileus. Electronically Signed   By: Lajean Manes M.D.   On: 10/18/2016 07:40   Dg Abd Portable 1v  Result Date: 10/17/2016 CLINICAL DATA:  Follow-up ileus EXAM: PORTABLE ABDOMEN - 1 VIEW COMPARISON:  10/16/2016 FINDINGS: Scattered large and small bowel gas is noted. No obstructive changes are seen. No free air is noted. Nasogastric catheter is noted within the stomach. Degenerative changes of lumbar spine are seen. IMPRESSION: Scattered bowel gas particularly within the colon which could represent an ileus. Correlation with physical exam is recommended. Electronically Signed   By: Inez Catalina M.D.   On: 10/17/2016 07:31   Dg Abd Portable 2v  Result Date: 10/16/2016 CLINICAL DATA:  Distended abdomen. EXAM: PORTABLE ABDOMEN - 2 VIEW COMPARISON:  Yesterday FINDINGS: Diffuse gaseous distention of colon favoring colonic ileus. Although no distal colonic gas is seen there is no morphologic changes suggestive of a  volvulus. Postoperative status also favors ileus. A nasogastric tube tip is over the proximal stomach and side port near the GE junction. No evidence of pneumatosis. No pneumoperitoneum on cross table lateral view. Chest reported separately. IMPRESSION: 1. Unchanged diffuse gaseous distention of colon. No distal colonic gas is seen but clinical circumstances favor adynamic ileus. No pneumoperitoneum or pneumatosis. 2. Nasogastric tube with side port over the GE junction and tip over the proximal stomach.  Electronically Signed   By: Monte Fantasia M.D.   On: 10/16/2016 16:50   Medications: . sodium chloride    . sodium chloride    . amiodarone 30 mg/hr (10/18/16 0700)  . DOPamine 3 mcg/kg/min (10/18/16 0700)  . fentaNYL infusion INTRAVENOUS 50 mcg/hr (10/18/16 0700)  . ferumoxytol Stopped (10/15/16 1407)  . heparin 10,000 units/ 20 mL infusion syringe 250 Units/hr (10/17/16 1157)  . heparin    . phenylephrine (NEO-SYNEPHRINE) Adult infusion 100 mcg/min (10/18/16 0700)  . piperacillin-tazobactam (ZOSYN)  IV 3.375 g (10/18/16 0550)  . dialysis replacement fluid (prismasate) 300 mL/hr at 10/17/16 1827  . dialysis replacement fluid (prismasate) 200 mL/hr at 10/17/16 1827  . dialysate (PRISMASATE) 1,000 mL/hr at 10/18/16 0310  .  sodium bicarbonate  infusion 1000 mL 150 mL/hr at 10/18/16 0700  . vancomycin Stopped (10/17/16 1657)  . vasopressin (PITRESSIN) infusion - *FOR SHOCK* 0.03 Units/min (10/18/16 0700)   . aspirin  300 mg Rectal Daily  . bisacodyl  10 mg Rectal Daily  . chlorhexidine gluconate (MEDLINE KIT)  15 mL Mouth Rinse BID  . Chlorhexidine Gluconate Cloth  6 each Topical Q0600  . feeding supplement (PRO-STAT SUGAR FREE 64)  30 mL Per Tube BID  . feeding supplement (VITAL HIGH PROTEIN)  1,000 mL Per Tube Q24H  . folic acid  1 mg Per Tube Daily  . guaiFENesin  1,200 mg Oral BID  . insulin aspart  0-24 Units Subcutaneous Q4H  . levothyroxine  25 mcg Per Tube QAC breakfast  . mouth  rinse  15 mL Mouth Rinse 10 times per day  . metoprolol tartrate  25 mg Per Tube BID  . midazolam  2 mg Intravenous Once  . moving right along book   Does not apply Once  . pantoprazole sodium  40 mg Per Tube Daily  . patient's guide to using coumadin book   Does not apply Once  . rocuronium  50 mg Intravenous Once  . sodium chloride flush  10-40 mL Intracatheter Q12H  . sodium chloride flush  3 mL Intravenous Q12H  . sodium chloride flush  3 mL Intravenous Q12H    Background: 83 y.o. year-old with PMH DM, HTN, HLD, chronic dCHF, COPD, PAF, RA, secondary HPT. Known CKD3/4 (DM, HTN, prior NSAID use; followed by Dr. Marval Regal). Last creatinine at [facility] 2.61 08/28/16. Heart cath 10/07/16 90% LM ds. S/p 2V CABG, MAZE procedure, clipping of atrial appendage by Dr. Roxy Manns  10/09/16. Persistent hypotension with insidious worsening of renal function, UOP trailed off despite lasix drip.  Asked to see for hemodynamically driven AKI on CKD4. CRRT initiated 10/3 2/2 massive overload/resp failure, AKI.   Assessment/Recommendations  1. AKI on CKD3/4 - post CABG and MAZE procedure. Steady rise in creatinine, no response to ceiling dose diuretics.  Hemodynamically driven (low blood pressures) AKI  in kidneys with little reserve.  1. Left IJ vas cath by CCM (10/3) 2. CRRT initiated 10/15/16 tolerating fine. K and phos OK so far. Monitor for need for repletion. (No phos done this AM - just ordered) 3. Circuit heparin off d/t IV heparin 4. Current goal net neg 50-100 per hour and tolerating this past 18 hours or so so continue (still 11 kg > pre op weight)  2. S/P CABG/MAZE procedure - ECHO 10/2;  no pericardial effusion. 3. Acute hypoxic resp failure. Pulm edema. Poss HCAP. Per CCM 4. Shock - vaso/neo/dopamine.  1. Sig hypotension 10/4 associated with spontaneous PTX.  2. CCM worried about  brewing sepsis. (ATB's (v/z) for possible HCAP) 3. Remains on neo/vaso/dopa - neo down some 5. Anemia - ABLA.  No prior significant CKD related anemia (had no outpt ESA requirement) 1. Transfuse for Hb <7 (transfused 9/27) 2. Dosed Aranesp 150 10/2, then weekly.  3. Feraheme dosed 10/3, redose 10/10. (tsat 6%) 6. Diabetes with neuropathy 1. Stopped gabapentin (early side effects from accumulation) 7. Hyponatremia - fluid overload. Resolved with CRRT 8. Metabolic acidosis - resolved. Discontinue bicarb drip   Jamal Maes, MD [facility] [phone] pager 10/18/2016, 8:16 AM   Addendum: phos down to 2. Na phos 30 mmoles (as has trended steadily down)  Jamal Maes, MD [facility] [phone] Pager 10/18/2016, 1:09 PM

## 2016-10-18 NOTE — Plan of Care (Signed)
Problem: Bowel/Gastric: Goal: Will not experience complications related to bowel motility Outcome: Progressing Pt has bowel movement this morning. Attempting to start TF tomorrow once MD rounds.   Problem: Respiratory: Goal: Levels of oxygenation will improve Outcome: Progressing Pt tolerating lower levels of oxygen support.   Problem: Urinary Elimination: Goal: Ability to achieve and maintain adequate renal perfusion and functioning will improve Outcome: Not Progressing Pt has low urine output and is on CRRT

## 2016-10-19 ENCOUNTER — Inpatient Hospital Stay (HOSPITAL_COMMUNITY): Payer: Medicare Other

## 2016-10-19 LAB — COMPREHENSIVE METABOLIC PANEL
ALBUMIN: 1.9 g/dL — AB (ref 3.5–5.0)
ALT: 380 U/L — AB (ref 17–63)
AST: 406 U/L — AB (ref 15–41)
Alkaline Phosphatase: 328 U/L — ABNORMAL HIGH (ref 38–126)
Anion gap: 13 (ref 5–15)
BUN: 23 mg/dL — AB (ref 6–20)
CHLORIDE: 97 mmol/L — AB (ref 101–111)
CO2: 26 mmol/L (ref 22–32)
Calcium: 7.9 mg/dL — ABNORMAL LOW (ref 8.9–10.3)
Creatinine, Ser: 1.77 mg/dL — ABNORMAL HIGH (ref 0.61–1.24)
GFR calc Af Amer: 39 mL/min — ABNORMAL LOW (ref >60)
GFR calc non Af Amer: 34 mL/min — ABNORMAL LOW (ref >60)
GLUCOSE: 100 mg/dL — AB (ref 65–99)
POTASSIUM: 3.4 mmol/L — AB (ref 3.5–5.1)
Sodium: 136 mmol/L (ref 135–145)
Total Bilirubin: 1.4 mg/dL — ABNORMAL HIGH (ref 0.3–1.2)
Total Protein: 5.1 g/dL — ABNORMAL LOW (ref 6.5–8.1)

## 2016-10-19 LAB — PROTIME-INR
INR: 3.93
Prothrombin Time: 38.2 seconds — ABNORMAL HIGH (ref 11.4–15.2)

## 2016-10-19 LAB — POCT I-STAT 3, ART BLOOD GAS (G3+)
Acid-Base Excess: 6 mmol/L — ABNORMAL HIGH (ref 0.0–2.0)
BICARBONATE: 29.4 mmol/L — AB (ref 20.0–28.0)
O2 Saturation: 98 %
PO2 ART: 93 mmHg (ref 83.0–108.0)
TCO2: 30 mmol/L (ref 22–32)
pCO2 arterial: 35.5 mmHg (ref 32.0–48.0)
pH, Arterial: 7.526 — ABNORMAL HIGH (ref 7.350–7.450)

## 2016-10-19 LAB — CBC WITH DIFFERENTIAL/PLATELET
Basophils Absolute: 0 10*3/uL (ref 0.0–0.1)
Basophils Relative: 0 %
Eosinophils Absolute: 0.4 10*3/uL (ref 0.0–0.7)
Eosinophils Relative: 2 %
HCT: 26.4 % — ABNORMAL LOW (ref 39.0–52.0)
Hemoglobin: 8.8 g/dL — ABNORMAL LOW (ref 13.0–17.0)
Lymphocytes Relative: 6 %
Lymphs Abs: 1.2 10*3/uL (ref 0.7–4.0)
MCH: 28.7 pg (ref 26.0–34.0)
MCHC: 33.3 g/dL (ref 30.0–36.0)
MCV: 86 fL (ref 78.0–100.0)
Monocytes Absolute: 1.3 10*3/uL — ABNORMAL HIGH (ref 0.1–1.0)
Monocytes Relative: 6 %
Neutro Abs: 17.4 10*3/uL — ABNORMAL HIGH (ref 1.7–7.7)
Neutrophils Relative %: 86 %
Platelets: 184 10*3/uL (ref 150–400)
RBC: 3.07 MIL/uL — ABNORMAL LOW (ref 4.22–5.81)
RDW: 15.5 % (ref 11.5–15.5)
WBC: 20.3 10*3/uL — ABNORMAL HIGH (ref 4.0–10.5)

## 2016-10-19 LAB — COOXEMETRY PANEL
Carboxyhemoglobin: 1.2 % (ref 0.5–1.5)
Methemoglobin: 0.8 % (ref 0.0–1.5)
O2 Saturation: 63.5 %
Total hemoglobin: 11.8 g/dL — ABNORMAL LOW (ref 12.0–16.0)

## 2016-10-19 LAB — GLUCOSE, CAPILLARY
GLUCOSE-CAPILLARY: 103 mg/dL — AB (ref 65–99)
GLUCOSE-CAPILLARY: 181 mg/dL — AB (ref 65–99)
Glucose-Capillary: 104 mg/dL — ABNORMAL HIGH (ref 65–99)
Glucose-Capillary: 132 mg/dL — ABNORMAL HIGH (ref 65–99)
Glucose-Capillary: 139 mg/dL — ABNORMAL HIGH (ref 65–99)
Glucose-Capillary: 139 mg/dL — ABNORMAL HIGH (ref 65–99)

## 2016-10-19 LAB — POCT I-STAT, CHEM 8
BUN: 23 mg/dL — AB (ref 6–20)
CALCIUM ION: 0.97 mmol/L — AB (ref 1.15–1.40)
CHLORIDE: 97 mmol/L — AB (ref 101–111)
CREATININE: 1.7 mg/dL — AB (ref 0.61–1.24)
GLUCOSE: 161 mg/dL — AB (ref 65–99)
HCT: 28 % — ABNORMAL LOW (ref 39.0–52.0)
Hemoglobin: 9.5 g/dL — ABNORMAL LOW (ref 13.0–17.0)
POTASSIUM: 3.4 mmol/L — AB (ref 3.5–5.1)
Sodium: 136 mmol/L (ref 135–145)
TCO2: 26 mmol/L (ref 22–32)

## 2016-10-19 LAB — CULTURE, RESPIRATORY

## 2016-10-19 LAB — CULTURE, RESPIRATORY W GRAM STAIN

## 2016-10-19 LAB — POCT ACTIVATED CLOTTING TIME
ACTIVATED CLOTTING TIME: 180 s
ACTIVATED CLOTTING TIME: 202 s
ACTIVATED CLOTTING TIME: 213 s
ACTIVATED CLOTTING TIME: 213 s
Activated Clotting Time: 180 seconds
Activated Clotting Time: 208 seconds

## 2016-10-19 LAB — APTT: APTT: 67 s — AB (ref 24–36)

## 2016-10-19 LAB — RENAL FUNCTION PANEL
ANION GAP: 10 (ref 5–15)
Albumin: 1.8 g/dL — ABNORMAL LOW (ref 3.5–5.0)
BUN: 22 mg/dL — ABNORMAL HIGH (ref 6–20)
CALCIUM: 7.8 mg/dL — AB (ref 8.9–10.3)
CHLORIDE: 101 mmol/L (ref 101–111)
CO2: 25 mmol/L (ref 22–32)
Creatinine, Ser: 1.72 mg/dL — ABNORMAL HIGH (ref 0.61–1.24)
GFR calc non Af Amer: 35 mL/min — ABNORMAL LOW (ref >60)
GFR, EST AFRICAN AMERICAN: 40 mL/min — AB (ref >60)
Glucose, Bld: 171 mg/dL — ABNORMAL HIGH (ref 65–99)
Phosphorus: 2.9 mg/dL (ref 2.5–4.6)
Potassium: 3.5 mmol/L (ref 3.5–5.1)
Sodium: 136 mmol/L (ref 135–145)

## 2016-10-19 LAB — MAGNESIUM: Magnesium: 2.2 mg/dL (ref 1.7–2.4)

## 2016-10-19 LAB — PHOSPHORUS: PHOSPHORUS: 2.3 mg/dL — AB (ref 2.5–4.6)

## 2016-10-19 LAB — VANCOMYCIN, TROUGH: VANCOMYCIN TR: 18 ug/mL (ref 15–20)

## 2016-10-19 MED ORDER — ORAL CARE MOUTH RINSE
15.0000 mL | Freq: Four times a day (QID) | OROMUCOSAL | Status: DC
  Administered 2016-10-19 (×4): 15 mL via OROMUCOSAL

## 2016-10-19 MED ORDER — POTASSIUM CHLORIDE 10 MEQ/50ML IV SOLN
10.0000 meq | INTRAVENOUS | Status: AC
  Administered 2016-10-19 (×3): 10 meq via INTRAVENOUS
  Filled 2016-10-19 (×3): qty 50

## 2016-10-19 MED ORDER — SODIUM PHOSPHATES 45 MMOLE/15ML IV SOLN
20.0000 mmol | Freq: Every day | INTRAVENOUS | Status: DC
  Administered 2016-10-19 – 2016-10-20 (×2): 20 mmol via INTRAVENOUS
  Filled 2016-10-19 (×3): qty 6.67

## 2016-10-19 MED ORDER — NEPRO/CARBSTEADY PO LIQD
1000.0000 mL | ORAL | Status: DC
  Administered 2016-10-19: 1000 mL via ORAL
  Filled 2016-10-19 (×2): qty 1000

## 2016-10-19 MED ORDER — DEXTROSE 5 % IV SOLN
2.0000 g | INTRAVENOUS | Status: DC
  Administered 2016-10-19 – 2016-10-26 (×8): 2 g via INTRAVENOUS
  Filled 2016-10-19 (×8): qty 2

## 2016-10-19 MED ORDER — POTASSIUM CHLORIDE 10 MEQ/100ML IV SOLN: 10.0000 meq | INTRAVENOUS | Status: DC

## 2016-10-19 MED ORDER — ORAL CARE MOUTH RINSE
15.0000 mL | OROMUCOSAL | Status: DC
  Administered 2016-10-19 – 2016-10-20 (×9): 15 mL via OROMUCOSAL

## 2016-10-19 MED ORDER — "THROMBI-PAD 3""X3"" EX PADS"
1.0000 | MEDICATED_PAD | Freq: Once | CUTANEOUS | Status: AC
  Administered 2016-10-19: 1 via TOPICAL
  Filled 2016-10-19: qty 1

## 2016-10-19 MED ORDER — METOPROLOL TARTRATE 12.5 MG HALF TABLET
12.5000 mg | ORAL_TABLET | Freq: Two times a day (BID) | ORAL | Status: DC
  Administered 2016-10-19: 12.5 mg via ORAL
  Filled 2016-10-19: qty 1

## 2016-10-19 MED ORDER — CHLORHEXIDINE GLUCONATE 0.12% ORAL RINSE (MEDLINE KIT)
15.0000 mL | Freq: Two times a day (BID) | OROMUCOSAL | Status: DC
  Administered 2016-10-19 – 2016-10-30 (×19): 15 mL via OROMUCOSAL

## 2016-10-19 MED ORDER — PHENYLEPHRINE HCL 10 MG/ML IJ SOLN
0.0000 ug/min | INTRAMUSCULAR | Status: DC
  Filled 2016-10-19: qty 4

## 2016-10-19 MED ORDER — DEXTROSE 5 % IV SOLN
2.0000 g | INTRAVENOUS | Status: DC
  Filled 2016-10-19: qty 2

## 2016-10-19 MED ORDER — CHLORHEXIDINE GLUCONATE 0.12% ORAL RINSE (MEDLINE KIT): 15.0000 mL | Freq: Two times a day (BID) | OROMUCOSAL | Status: DC

## 2016-10-19 MED ORDER — NEPRO/CARBSTEADY PO LIQD
237.0000 mL | ORAL | Status: DC
  Filled 2016-10-19 (×5): qty 237

## 2016-10-19 MED ORDER — ASPIRIN 81 MG PO CHEW
81.0000 mg | CHEWABLE_TABLET | Freq: Every day | ORAL | Status: DC
  Administered 2016-10-19 – 2016-11-03 (×16): 81 mg
  Filled 2016-10-19 (×17): qty 1

## 2016-10-19 NOTE — Progress Notes (Signed)
Pharmacy Antibiotic Note  [name] is a 83 y.o. male with PNA.   Pharmacy has been consulted for vancomycin and zosyn dosing (D3 abx). Respiratory cultures notes with enterobacter (resistant to ancef; sens to rocephin, cipro) -WBC= 20.3, afeb, SCr= 1.7 and CrCl ~ 40 -vancomycin trough= 18 (at ~ 3:30pm)  *Spoke to Dr. Pearline Cables: discontinue vancomycin and change to ceftriaxone (covering enterobacter PNA)  Plan: -Ceftriaxone 2gm IV q24h -D/c vancomycin and zosyn   Height: 5\' 8"  (172.7 cm) Weight: 237 lb 7 oz (107.7 kg) IBW/kg (Calculated) : 68.4  Temp (24hrs), Avg:98.4 F (36.9 C), Min:97.7 F (36.5 C), Max:99 F (37.2 C)   Recent Labs Lab 10/16/16 1515 10/16/16 2355 10/17/16 0335 10/17/16 1117 10/17/16 1501  10/18/16 0400  10/18/16 1606 10/18/16 1626 10/19/16 0426 10/19/16 1524 10/19/16 1534  WBC 19.1* 15.2* 18.5*  --   --   --  19.5*  --   --   --  20.3*  --   --   CREATININE 2.98*  2.95*  --  2.41*  --   --   < > 1.87*  < > 1.87* 1.70* 1.77* 1.72* 1.70*  LATICACIDVEN 4.6*  --   --  2.2* 2.1*  --   --   --   --   --   --   --   --   VANCOTROUGH  --   --   --   --   --   --   --   --   --   --   --  18  --   < > = values in this interval not displayed.  Estimated Creatinine Clearance: 38.5 mL/min (A) (by C-G formula based on SCr of 1.7 mg/dL (H)).    No Known Allergies   Thank you for allowing pharmacy to be a part of this patient's care.  Hildred Laser, Pharm D 10/19/2016 5:06 PM

## 2016-10-19 NOTE — Progress Notes (Signed)
10 Days Post-Op Procedure(s) (LRB): CORONARY ARTERY BYPASS GRAFTING (CABG) x two, using left internal mammary artery and right leg greater saphenous vein harvested endoscopically (N/A) MAZE (N/A) TRANSESOPHAGEAL ECHOCARDIOGRAM (TEE) (N/A) CLIPPING OF ATRIAL APPENDAGE Subjective: Off pressors except renal dopamine with cvp 10 coox 62 afib w/ HR to 120- start low dose metoprolol Ileus better- start nepro TF CRT removing 100cc/hr Objective: Vital signs in last 24 hours: Temp:  [97.7 F (36.5 C)-98.8 F (37.1 C)] 97.7 F (36.5 C) (10/07 0800) Pulse Rate:  [82-130] 116 (10/07 0800) Cardiac Rhythm: Atrial fibrillation (10/07 0800) Resp:  [21-25] 24 (10/07 0800) BP: (83-143)/(47-81) 110/70 (10/07 0800) SpO2:  [93 %-100 %] 100 % (10/07 0816) Arterial Line BP: (108-155)/(37-65) 121/55 (10/07 0800) FiO2 (%):  [40 %-70 %] 40 % (10/07 0816) Weight:  [237 lb 7 oz (107.7 kg)] 237 lb 7 oz (107.7 kg) (10/07 0500)  Hemodynamic parameters for last 24 hours: CVP:  [8 mmHg-10 mmHg] 10 mmHg  Intake/Output from previous day: 10/06 0701 - 10/07 0700 In: 2046.3 [I.V.:1251.3; NG/GT:320; IV Piggyback:445] Out: 3254 [Urine:384; Emesis/NG output:75; Chest Tube:30] Intake/Output this shift: Total I/O In: 40.6 [I.V.:28.1; IV Piggyback:12.5] Out: 126 [Urine:15; Other:111]       Exam    General- comfortable on vent   Lungs- clear without rales, wheezes   Cor- afib rhythm, no murmur , gallop   Abdomen- soft, non-tender   Extremities - warm, non-tender, minimal edema   Neuro- oriented, appropriate, no focal weakness   Lab Results:  Recent Labs  10/18/16 0400 10/18/16 1626 10/19/16 0426  WBC 19.5*  --  20.3*  HGB 9.3* 9.2* 8.8*  HCT 27.3* 27.0* 26.4*  PLT 222  --  184   BMET:  Recent Labs  10/18/16 1606 10/18/16 1626 10/19/16 0426  NA 135 134* 136  K 3.7 3.8 3.4*  CL 95* 94* 97*  CO2 25  --  26  GLUCOSE 135* 142* 100*  BUN 24* 26* 23*  CREATININE 1.87* 1.70* 1.77*  CALCIUM 7.5*   --  7.9*    PT/INR:  Recent Labs  10/19/16 0426  LABPROT 38.2*  INR 3.93   ABG    Component Value Date/Time   PHART 7.526 (H) 10/19/2016 0427   HCO3 29.4 (H) 10/19/2016 0427   TCO2 30 10/19/2016 0427   ACIDBASEDEF 1.0 10/17/2016 0007   O2SAT 63.5 10/19/2016 0450   CBG (last 3)   Recent Labs  10/19/16 0008 10/19/16 0401 10/19/16 0753  GLUCAP 132* 103* 104*    Assessment/Plan: S/P Procedure(s) (LRB): CORONARY ARTERY BYPASS GRAFTING (CABG) x two, using left internal mammary artery and right leg greater saphenous vein harvested endoscopically (N/A) MAZE (N/A) TRANSESOPHAGEAL ECHOCARDIOGRAM (TEE) (N/A) CLIPPING OF ATRIAL APPENDAGE inr 4.2- no heparin LFTs improved Edema improved Cont antibiotics   LOS: 10 days    Tharon Aquas Trigt III 10/19/2016

## 2016-10-19 NOTE — Progress Notes (Signed)
IV team RN rounding on patient and instructed nursing staff not to change bleeding CVL dressing until 48 hours has passed, to allow for proper clot formation.  RN will pass along information. [place], Ardeth Sportsman

## 2016-10-19 NOTE — Progress Notes (Signed)
CT surgery p.m. Rounds  Excellent day Continues to remove 100 cc/h with CRT Neo at 30 mics Rate controlled A. Fib FiO2 down to 40% Abdomen soft Nepro tube feeds started without difficulty

## 2016-10-19 NOTE — Progress Notes (Signed)
[facility] Rounding Note  Subjective/Interval History:  Pt intubated 10/3, CRRT initiated 10/3 300/200/1000/all 4K fluids Spontaneous PTX 10/4 new R chest tube  Pulling 50-100 and weight is slowly coming down Pressors down Oxygenation improving  Objective Vital signs in last 24 hours: Vitals:   10/19/16 0600 10/19/16 0700 10/19/16 0800 10/19/16 0816  BP: (!) 83/52 (!) 95/58 110/70   Pulse: (!) 114 (!) 130 (!) 116   Resp: (!) 21 (!) 24 (!) 24   Temp: 98.1 F (36.7 C) 97.7 F (36.5 C) 97.7 F (36.5 C)   TempSrc:      SpO2: 95% 94% 97% 100%  Weight:      Height:       Weight change: -2.2 kg (-4 lb 13.6 oz)  Intake/Output Summary (Last 24 hours) at 10/19/16 0825 Last data filed at 10/19/16 0800  Gross per 24 hour  Intake          1787.48 ml  Output             4899 ml  Net         -3111.52 ml   Weight summary Date/Time  Weight        10/19/16  107.7   10/18/16  109.9 kg   10/17/16  111.6 kg   10/16/16   113 kg    10/15/16   112.5 kg  CRRT initiated  10/13/16   104.7 kg    10/12/16   105.4 kg    10/11/16   108.1 kg    10/10/16   106.9 kg    10/09/16   98.9 kg    10/09/16   98.9 kg      Physical Exam:  Blood pressure 110/70, pulse (!) 116, temperature 97.7 F (36.5 C), resp. rate (!) 24, height 5' 8"  (1.727 m), weight 107.7 kg (237 lb 7 oz), SpO2 100 %.  Gen: Obese pale appearing WM Weight 107.7 down 5 kg with CRRT, remains 8+ kg above pre op weight Opens eyes, nods appropratiely Left IJ trialysis catheter (10/3 CCM) no issues R chest tube (10/4) Cannot see neck veins d/t lines, etc BS's symmetric Distant heart sounds  S1S2 NO S3 Can't appreciate murmur Sternotomy incision clean Abdomen distended/quiet No focal tenderness  Overall some improvement in anasarca LE and UE edema pitting still quite prominent Absent toe left foot/worse edema on that side Minimal UOP dark yellow(but not muddy brown)   Recent Labs Lab 10/15/16 1610 10/16/16 0306 10/16/16 1515  10/17/16 0335 10/17/16 1543 10/18/16 0400 10/18/16 0830 10/18/16 1606 10/18/16 1626 10/19/16 0426  NA  --  129* 130*  131* 131* 134* 136 136 135 134* 136  K  --  4.1 4.9  4.9 3.7 3.7 3.6 3.6 3.7 3.8 3.4*  CL  --  96* 96*  97* 94* 95* 95* 96* 95* 94* 97*  CO2  --  21* 18*  18* 23 26 28 28 25   --  26  GLUCOSE  --  81 126*  126* 132* 180* 102* 106* 135* 142* 100*  BUN  --  44* 35*  35* 33* 30* 24* 23* 24* 26* 23*  CREATININE  --  3.34* 2.98*  2.95* 2.41* 2.22* 1.87* 1.83* 1.87* 1.70* 1.77*  CALCIUM  --  7.0* 7.3*  7.3* 6.7* 6.9* 7.2* 7.4* 7.5*  --  7.9*  PHOS 7.0* 4.7* 5.2*  5.2* 3.2  3.0 2.2*  --  2.0* 2.9  --   --     Recent Labs Lab  10/17/16 0642  10/18/16 0400 10/18/16 0830 10/18/16 1606 10/19/16 0426  AST 807*  --  608*  --   --  406*  ALT 414*  --  450*  --   --  380*  ALKPHOS 229*  --  309*  --   --  328*  BILITOT 1.3*  --  1.3*  --   --  1.4*  PROT 4.7*  --  5.3*  --   --  5.1*  ALBUMIN 1.9*  < > 2.0* 1.9* 1.9* 1.9*  < > = values in this interval not displayed.  Recent Labs Lab 10/16/16 2355 10/17/16 0335 10/18/16 0400 10/18/16 1626 10/19/16 0426  WBC 15.2* 18.5* 19.5*  --  20.3*  NEUTROABS  --   --   --   --  17.4*  HGB 8.4* 9.0* 9.3* 9.2* 8.8*  HCT 23.6* 26.1* 27.3* 27.0* 26.4*  MCV 84.9 84.5 85.0  --  86.0  PLT 178 210 222  --  184   Lab Results  Component Value Date   INR 3.93 10/19/2016   INR 3.50 10/18/2016   INR 5.70 (HH) 10/17/2016    Recent Labs Lab 10/18/16 1631 10/18/16 1946 10/19/16 0008 10/19/16 0401 10/19/16 0753  GLUCAP 142* 161* 132* 103* 104*     Iron/TIBC/Ferritin/ %Sat    Component Value Date/Time   IRON 8 (L) 10/15/2016 0433   TIBC 140 (L) 10/15/2016 0433   IRONPCTSAT 6 (L) 10/15/2016 0433   Studies/Results: Dg Chest Port 1 View  Result Date: 10/19/2016 CLINICAL DATA:  Followup CABG.  Chest 2.  Ventilator support. EXAM: PORTABLE CHEST 1 VIEW COMPARISON:  10/18/2016 FINDINGS: Endotracheal tube tip is 4 cm  above the carina. Nasogastric tube enters the abdomen. Right internal jugular central line is in the SVC above the azygos level. Right pigtail chest tube in place. No visible pneumothorax on the right. Left subclavian central line tip in the SVC at the azygos level. Persistent interstitial and alveolar edema, similar allowing for technical differences. No new finding. IMPRESSION: Lines and tubes well positioned. No pneumothorax. Persistent interstitial and alveolar edema. Electronically Signed   By: Nelson Chimes M.D.   On: 10/19/2016 06:55   Dg Chest Port 1 View  Result Date: 10/18/2016 CLINICAL DATA:  Followup exam. Intubated patient. Right-sided chest tube. Congestive heart failure. EXAM: PORTABLE CHEST 1 VIEW COMPARISON:  10/17/2016 FINDINGS: Interstitial and hazy airspace opacities in the lungs are without substantial change allowing for differences in lung volume and patient positioning. No new lung abnormalities. No convincing pleural effusion.  No pneumothorax. Right-sided chest tube is stable. Endotracheal tube, nasal/ orogastric tube, left subclavian dual lumen central venous catheter and right internal jugular catheter are all stable. IMPRESSION: 1. No pneumothorax. 2. Patchy areas of lung opacity are without significant change. This is consistent with persistent pulmonary edema. 3. Support apparatus including the right chest tube are stable. Electronically Signed   By: Lajean Manes M.D.   On: 10/18/2016 07:38   Dg Chest Port 1 View  Result Date: 10/17/2016 CLINICAL DATA:  Central line placement EXAM: PORTABLE CHEST 1 VIEW COMPARISON:  10/18/2006 FINDINGS: Endotracheal tube tip is about 4.3 cm superior to the carina. Esophageal tube tip is below the diaphragm. Post sternotomy changes. Stable positioning of right-sided chest tube. Left subclavian central venous catheter tip overlies the venous confluence. Right subclavian central venous catheter tip is directed cephalad over the right innominate  vein. New right IJ central venous catheter tip projects over venous confluence. No  right pneumothorax is seen. Cardiomegaly with continued moderate to marked edema or infiltrates. Left shoulder replacement. IMPRESSION: 1. Right IJ central venous catheter tip overlies the venous confluence. No right pneumothorax. Similar appearance of right subclavian venous catheter with tip coiled upon itself over the brachiocephalic region. 2. Continued cardiomegaly and moderate-to-marked bilateral edema or infiltrates. Electronically Signed   By: Donavan Foil M.D.   On: 10/17/2016 17:14   Dg Abd Portable 1v  Result Date: 10/19/2016 CLINICAL DATA:  Followup ileus EXAM: PORTABLE ABDOMEN - 1 VIEW COMPARISON:  10/18/2016 FINDINGS: Nasogastric tube tip in the region of the antrum. Mild ileus for cysts, not significantly changed since yesterday. No new finding on this image that does not include the entire pelvis. IMPRESSION: No change.  Persistent ileus pattern. Electronically Signed   By: Nelson Chimes M.D.   On: 10/19/2016 06:53   Dg Abd Portable 1v  Result Date: 10/18/2016 CLINICAL DATA:  Followup adynamic ileus.  Orogastric tube. EXAM: PORTABLE ABDOMEN - 1 VIEW COMPARISON:  10/17/2016 FINDINGS: Nasal/ orogastric tube curls within the proximal stomach unchanged from the previous exam. Partly air-filled prominent loops of bowel are noted similar to the prior study. IMPRESSION: 1. Stable nasal/orogastric tube within the proximal stomach. 2. No change in the appearance of the bowel, well with prominent, partly air-filled, loops of bowel suggesting an adynamic ileus. Electronically Signed   By: Lajean Manes M.D.   On: 10/18/2016 07:40   Medications: . sodium chloride    . sodium chloride    . amiodarone 30 mg/hr (10/19/16 0700)  . DOPamine 3 mcg/kg/min (10/19/16 0700)  . fentaNYL infusion INTRAVENOUS 50 mcg/hr (10/19/16 0700)  . ferumoxytol Stopped (10/15/16 1407)  . heparin 10,000 units/ 20 mL infusion syringe 250  Units/hr (10/19/16 0050)  . heparin    . phenylephrine (NEO-SYNEPHRINE) Adult infusion Stopped (10/19/16 0500)  . piperacillin-tazobactam (ZOSYN)  IV 3.375 g (10/19/16 0500)  . dialysis replacement fluid (prismasate) 300 mL/hr at 10/19/16 0355  . dialysis replacement fluid (prismasate) 200 mL/hr at 10/18/16 1805  . dialysate (PRISMASATE) 1,000 mL/hr at 10/19/16 0406  . vancomycin Stopped (10/18/16 1720)  . vasopressin (PITRESSIN) infusion - *FOR SHOCK* Stopped (10/18/16 2040)   . aspirin  300 mg Rectal Daily  . bisacodyl  10 mg Rectal Daily  . chlorhexidine gluconate (MEDLINE KIT)  15 mL Mouth Rinse BID  . Chlorhexidine Gluconate Cloth  6 each Topical Q0600  . feeding supplement (PRO-STAT SUGAR FREE 64)  30 mL Per Tube BID  . feeding supplement (VITAL HIGH PROTEIN)  1,000 mL Per Tube Q24H  . folic acid  1 mg Per Tube Daily  . guaiFENesin  1,200 mg Oral BID  . insulin aspart  0-24 Units Subcutaneous Q4H  . levothyroxine  25 mcg Per Tube QAC breakfast  . mouth rinse  15 mL Mouth Rinse QID  . metoCLOPramide (REGLAN) injection  10 mg Intravenous Q6H  . midazolam  2 mg Intravenous Once  . moving right along book   Does not apply Once  . pantoprazole (PROTONIX) IV  40 mg Intravenous Q24H  . patient's guide to using coumadin book   Does not apply Once  . rocuronium  50 mg Intravenous Once  . sodium chloride flush  10-40 mL Intracatheter Q12H  . sodium chloride flush  3 mL Intravenous Q12H  . sodium chloride flush  3 mL Intravenous Q12H    Background: 83 y.o. year-old with PMH DM, HTN, HLD, chronic dCHF, COPD, PAF, RA, secondary HPT. Known  CKD3/4 (DM, HTN, prior NSAID use; followed by Dr. Marval Regal). Last creatinine at [facility] 2.61 08/28/16. Heart cath 10/07/16 90% LM ds. S/p 2V CABG, MAZE procedure, clipping of atrial appendage by Dr. Roxy Manns  10/09/16. Persistent hypotension with insidious worsening of renal function, UOP trailed off despite lasix drip.  Asked to see for hemodynamically  driven AKI on CKD4. CRRT initiated 10/3 2/2 massive overload/resp failure, AKI.   Assessment/Recommendations  1. AKI on CKD3/4 - post CABG and MAZE procedure. Steady rise in creatinine, no response to ceiling dose diuretics.  Hemodynamically driven (low blood pressures) AKI  in kidneys with little reserve.  1. Left IJ vas cath by CCM (10/3) 2. CRRT initiated 10/15/16 tolerating fine.  3. Heparin with CRRT 4. Replete K today 3 runs for K 3.4 5. Add daily IV phos repletion at 20 mmoles.day 6. UF goal for today 100-125 cc/hour  2. S/P CABG/MAZE procedure - ECHO 10/2;  no pericardial effusion. Per Dr. Roxy Manns and associates 3. Acute hypoxic resp failure. Pulm edema. Poss HCAP. Oxygenation improving. Down to 40% FIO2 from 80 and lower PEEP 4. Shock - vaso/neo/dopamine.  1. Sig hypotension 10/4 associated with spontaneous PTX.  2. CCM worried about brewing sepsis. (ATB's (v/z) for possible HCAP) 3. Remains on neo/vaso/dopa - pressors weaning 5. Anemia - ABLA. No prior significant CKD related anemia (had no outpt ESA requirement) 1. Transfuse for Hb <7 (transfused 9/27) 2. Dosed Aranesp 150 10/2, then will get weekly.  3. Feraheme dosed 10/3, redose 10/10. (tsat 6%) 6. Diabetes with neuropathy 1. Stopped gabapentin (early side effects from accumulation) 7. Hyponatremia - fluid overload. Resolved with CRRT 8. Metabolic acidosis - resolved. Discontinue bicarb drip   Jamal Maes, MD [facility] [phone] pager 10/19/2016, 8:25 AM

## 2016-10-19 NOTE — Plan of Care (Signed)
Problem: Nutrition: Goal: Adequate nutrition will be maintained Outcome: Progressing Nepro tube feedings started

## 2016-10-19 NOTE — Plan of Care (Signed)
Problem: Pain Managment: Goal: General experience of comfort will improve Outcome: Progressing Per nursing assessment, patient seems to be comfortable on pain medications ordered/given.  Problem: Skin Integrity: Goal: Risk for impaired skin integrity will decrease Outcome: Progressing Pt is turned every two hours and has sacral foam intact.   Problem: Bowel/Gastric: Goal: Will not experience complications related to bowel motility Outcome: Progressing Pt continues to have bowel sounds. No bowel movement since 10/6/ Suppository given today and TF started.  Problem: Respiratory: Goal: Levels of oxygenation will improve Outcome: Progressing Pt is tolerating lower levels of FIO2 today at 40%.

## 2016-10-19 NOTE — Progress Notes (Signed)
PULMONARY / CRITICAL CARE MEDICINE   Name: [name] MRN: [id number] DOB: 1930-02-28    ADMISSION DATE:  10/09/2016 CONSULTATION DATE: 10/15/2016  REFERRING MD:  Roxy Manns  CHIEF COMPLAINT:  Hypoxemic respiratory failrue  HISTORY OF PRESENT ILLNESS:   83 y/o male underwent a CABG and MAZE on 9/27.  Developed worsening renal failure post operatively requiring dialysis, needed intubation for catheter placement.   On 10/3 he developed shock in the setting of a spontaneous R sided pneumothorax.     SUBJECTIVE:  Oxygenation improving Vent support weaning Tolerating volume removal well Pressor support decreasing while not on fentanyl  VITAL SIGNS: BP 103/66   Pulse (!) 119   Temp 97.7 F (36.5 C)   Resp (!) 24   Ht 5\' 8"  (1.727 m)   Wt 237 lb 7 oz (107.7 kg)   SpO2 95%   BMI 36.10 kg/m   HEMODYNAMICS: CVP:  [8 mmHg-10 mmHg] 10 mmHg  VENTILATOR SETTINGS: Vent Mode: PRVC FiO2 (%):  [40 %-60 %] 40 % Set Rate:  [24 bmp] 24 bmp Vt Set:  [540 mL] 540 mL PEEP:  [8 cmH20-12 cmH20] 8 cmH20 Plateau Pressure:  [28 cmH20-35 cmH20] 28 cmH20  INTAKE / OUTPUT: I/O last 3 completed shifts: In: 4999.9 [I.V.:4052.4; Other:30; NG/GT:360; IV Piggyback:557.5] Out: 9149 [Urine:464; Emesis/NG output:175; HUTML:4650; Chest Tube:80]  PHYSICAL EXAMINATION:  General:  In bed on vent HENT: NCAT ETT in place PULM: some rhonchi bilaterally B, vent supported breathing CV: RRR, no mgr GI: BS+, soft, nontender MSK: normal bulk and tone Neuro: sedated on vent, will awaken and follow commands    LABS:  BMET  Recent Labs Lab 10/18/16 0830 10/18/16 1606 10/18/16 1626 10/19/16 0426  NA 136 135 134* 136  K 3.6 3.7 3.8 3.4*  CL 96* 95* 94* 97*  CO2 28 25  --  26  BUN 23* 24* 26* 23*  CREATININE 1.83* 1.87* 1.70* 1.77*  GLUCOSE 106* 135* 142* 100*    Electrolytes  Recent Labs Lab 10/17/16 0335  10/18/16 0400 10/18/16 0830 10/18/16 1606 10/19/16 0426 10/19/16 0835  CALCIUM 6.7*   < > 7.2* 7.4* 7.5* 7.9*  --   MG 2.3  --  2.2  --   --  2.2  --   PHOS 3.2  3.0  < >  --  2.0* 2.9  --  2.3*  < > = values in this interval not displayed.  CBC  Recent Labs Lab 10/17/16 0335 10/18/16 0400 10/18/16 1626 10/19/16 0426  WBC 18.5* 19.5*  --  20.3*  HGB 9.0* 9.3* 9.2* 8.8*  HCT 26.1* 27.3* 27.0* 26.4*  PLT 210 222  --  184    Coag's  Recent Labs Lab 10/17/16 0335 10/18/16 0400 10/19/16 0426  APTT >200* 59* 67*  INR 5.70* 3.50 3.93    Sepsis Markers  Recent Labs Lab 10/16/16 1515 10/17/16 1117 10/17/16 1501  LATICACIDVEN 4.6* 2.2* 2.1*    ABG  Recent Labs Lab 10/17/16 0325 10/18/16 0409 10/19/16 0427  PHART 7.434 7.489* 7.526*  PCO2ART 37.7 39.8 35.5  PO2ART 84.1 93.2 93.0    Liver Enzymes  Recent Labs Lab 10/17/16 0642  10/18/16 0400 10/18/16 0830 10/18/16 1606 10/19/16 0426  AST 807*  --  608*  --   --  406*  ALT 414*  --  450*  --   --  380*  ALKPHOS 229*  --  309*  --   --  328*  BILITOT 1.3*  --  1.3*  --   --  1.4*  ALBUMIN 1.9*  < > 2.0* 1.9* 1.9* 1.9*  < > = values in this interval not displayed.  Cardiac Enzymes No results for input(s): TROPONINI, PROBNP in the last 168 hours.  Glucose  Recent Labs Lab 10/18/16 1210 10/18/16 1631 10/18/16 1946 10/19/16 0008 10/19/16 0401 10/19/16 0753  GLUCAP 103* 142* 161* 132* 103* 104*    Imaging Dg Chest Port 1 View  Result Date: 10/19/2016 CLINICAL DATA:  Followup CABG.  Chest 2.  Ventilator support. EXAM: PORTABLE CHEST 1 VIEW COMPARISON:  10/18/2016 FINDINGS: Endotracheal tube tip is 4 cm above the carina. Nasogastric tube enters the abdomen. Right internal jugular central line is in the SVC above the azygos level. Right pigtail chest tube in place. No visible pneumothorax on the right. Left subclavian central line tip in the SVC at the azygos level. Persistent interstitial and alveolar edema, similar allowing for technical differences. No new finding. IMPRESSION:  Lines and tubes well positioned. No pneumothorax. Persistent interstitial and alveolar edema. Electronically Signed   By: Nelson Chimes M.D.   On: 10/19/2016 06:55   Dg Abd Portable 1v  Result Date: 10/19/2016 CLINICAL DATA:  Followup ileus EXAM: PORTABLE ABDOMEN - 1 VIEW COMPARISON:  10/18/2016 FINDINGS: Nasogastric tube tip in the region of the antrum. Mild ileus for cysts, not significantly changed since yesterday. No new finding on this image that does not include the entire pelvis. IMPRESSION: No change.  Persistent ileus pattern. Electronically Signed   By: Nelson Chimes M.D.   On: 10/19/2016 06:53     STUDIES:  CXR with pulmonary edema 10/3, 10/7 with persistent airspace disease, pigtail catheter remains in good position  CULTURES: 10/4 resp culture > abundant WBC , recultured  ANTIBIOTICS: None  SIGNIFICANT EVENTS: 10/3 hypoxemic respiratory failure requiring intubation  LINES/TUBES: ETT 10/3>>> L [place] Trialysis catheter 10/3>>> L radial a-line 10/3>>> R IJ single lumen catheter 10/3>>> 10/4 R subclavian CVL 10/4 > 10/5 R pigtail chest drain 10/4 >  R IJ CVL 10/5 >   DISCUSSION: 83 y/o male s/p CABG, MAZE procedure here with persistent acute respiratory failure with hypoxemia in the setting of renal failure requiring HD and persistent bilateral pulmonary infiltrates.    ASSESSMENT / PLAN:  PULMONARY A: Acute respiratory failure with hypoxemia Bilateral pulmonary infiltrates HCAP Spontaneous pneumothorax on R 10/4 Acute pulmonary edema ARDS  P:   Continue volume removal Continue full vent support VAP prevention Wean PEEP/FiO2 as able to maintain O2 saturation > 92% Decrease respiratory rate Pigtail chest tube to suction today given slight pleural effusion, but suspect we could remove this within the next 24-48 hours  CARDIOVASCULAR A:  CAD, s/p CABG Afib, s/p MAZE Shock 10/3 in setting of pneumothorax P:  Tele Continue volume removal Cardiac meds per  TCTS   RENAL A:   Hyponatermia AKI Anasarca P:   Continue volume removal with CVVHD Monitor BMET and UOP Replace electrolytes as needed   GASTROINTESTINAL A:   Ileus Shock liver > improving P:   Repeat LFT   HEMATOLOGIC A:   Coagulopathy in setting of shock liver, anticoagulant meds P:  Monitor for bleeding  INFECTIOUS A:   HCAP P:   Continue vanc/zosyn Monitor for fever  ENDOCRINE A:   DM2 P:   Monitor glucose, SSI  NEUROLOGIC A:   No acute issues P:   RASS -2 Continue Fentanyl infusion   FAMILY  - Updates: wife and daughter updated beside 10/6, none bedside 10/7 - Inter-disciplinary family meet or Palliative  Care meeting due by:  day 7  My cc time 31 minutes  Roselie Awkward, MD [facility] Pager: [phone] Cell: [phone] After 3pm or if no response, call [phone]   10/19/2016, 9:54 AM

## 2016-10-19 NOTE — Plan of Care (Signed)
Problem: Pain Managment: Goal: General experience of comfort will improve Outcome: Not Progressing Increasing need for higher doses of Fentanyl and Versed

## 2016-10-20 ENCOUNTER — Inpatient Hospital Stay (HOSPITAL_COMMUNITY): Payer: Medicare Other

## 2016-10-20 LAB — POCT I-STAT 3, ART BLOOD GAS (G3+)
ACID-BASE EXCESS: 3 mmol/L — AB (ref 0.0–2.0)
BICARBONATE: 27.5 mmol/L (ref 20.0–28.0)
O2 Saturation: 95 %
PH ART: 7.417 (ref 7.350–7.450)
PO2 ART: 76 mmHg — AB (ref 83.0–108.0)
TCO2: 29 mmol/L (ref 22–32)
pCO2 arterial: 42.7 mmHg (ref 32.0–48.0)

## 2016-10-20 LAB — RENAL FUNCTION PANEL
ALBUMIN: 2 g/dL — AB (ref 3.5–5.0)
ANION GAP: 13 (ref 5–15)
BUN: 25 mg/dL — ABNORMAL HIGH (ref 6–20)
CO2: 24 mmol/L (ref 22–32)
CREATININE: 1.68 mg/dL — AB (ref 0.61–1.24)
Calcium: 8.2 mg/dL — ABNORMAL LOW (ref 8.9–10.3)
Chloride: 98 mmol/L — ABNORMAL LOW (ref 101–111)
GFR calc non Af Amer: 36 mL/min — ABNORMAL LOW (ref >60)
GFR, EST AFRICAN AMERICAN: 41 mL/min — AB (ref >60)
GLUCOSE: 174 mg/dL — AB (ref 65–99)
PHOSPHORUS: 3.3 mg/dL (ref 2.5–4.6)
Potassium: 3.8 mmol/L (ref 3.5–5.1)
SODIUM: 135 mmol/L (ref 135–145)

## 2016-10-20 LAB — CBC WITH DIFFERENTIAL/PLATELET
Basophils Absolute: 0 10*3/uL (ref 0.0–0.1)
Basophils Relative: 0 %
Eosinophils Absolute: 0.6 10*3/uL (ref 0.0–0.7)
Eosinophils Relative: 3 %
HCT: 27.9 % — ABNORMAL LOW (ref 39.0–52.0)
Hemoglobin: 9.2 g/dL — ABNORMAL LOW (ref 13.0–17.0)
Lymphocytes Relative: 6 %
Lymphs Abs: 1.3 10*3/uL (ref 0.7–4.0)
MCH: 29.2 pg (ref 26.0–34.0)
MCHC: 33 g/dL (ref 30.0–36.0)
MCV: 88.6 fL (ref 78.0–100.0)
Monocytes Absolute: 1.1 10*3/uL — ABNORMAL HIGH (ref 0.1–1.0)
Monocytes Relative: 5 %
Neutro Abs: 18.5 10*3/uL — ABNORMAL HIGH (ref 1.7–7.7)
Neutrophils Relative %: 86 %
Platelets: 200 10*3/uL (ref 150–400)
RBC: 3.15 MIL/uL — ABNORMAL LOW (ref 4.22–5.81)
RDW: 16.5 % — ABNORMAL HIGH (ref 11.5–15.5)
WBC: 21.5 10*3/uL — ABNORMAL HIGH (ref 4.0–10.5)

## 2016-10-20 LAB — COMPREHENSIVE METABOLIC PANEL
ALT: 375 U/L — ABNORMAL HIGH (ref 17–63)
AST: 317 U/L — AB (ref 15–41)
Albumin: 1.9 g/dL — ABNORMAL LOW (ref 3.5–5.0)
Alkaline Phosphatase: 366 U/L — ABNORMAL HIGH (ref 38–126)
Anion gap: 11 (ref 5–15)
BUN: 24 mg/dL — ABNORMAL HIGH (ref 6–20)
CHLORIDE: 100 mmol/L — AB (ref 101–111)
CO2: 25 mmol/L (ref 22–32)
Calcium: 8.1 mg/dL — ABNORMAL LOW (ref 8.9–10.3)
Creatinine, Ser: 1.7 mg/dL — ABNORMAL HIGH (ref 0.61–1.24)
GFR, EST AFRICAN AMERICAN: 41 mL/min — AB (ref >60)
GFR, EST NON AFRICAN AMERICAN: 35 mL/min — AB (ref >60)
Glucose, Bld: 180 mg/dL — ABNORMAL HIGH (ref 65–99)
POTASSIUM: 3.8 mmol/L (ref 3.5–5.1)
Sodium: 136 mmol/L (ref 135–145)
Total Bilirubin: 1 mg/dL (ref 0.3–1.2)
Total Protein: 5.2 g/dL — ABNORMAL LOW (ref 6.5–8.1)

## 2016-10-20 LAB — POCT I-STAT, CHEM 8
BUN: 25 mg/dL — ABNORMAL HIGH (ref 6–20)
Calcium, Ion: 1.04 mmol/L — ABNORMAL LOW (ref 1.15–1.40)
Chloride: 100 mmol/L — ABNORMAL LOW (ref 101–111)
Creatinine, Ser: 1.6 mg/dL — ABNORMAL HIGH (ref 0.61–1.24)
Glucose, Bld: 177 mg/dL — ABNORMAL HIGH (ref 65–99)
HEMATOCRIT: 29 % — AB (ref 39.0–52.0)
HEMOGLOBIN: 9.9 g/dL — AB (ref 13.0–17.0)
Potassium: 3.8 mmol/L (ref 3.5–5.1)
SODIUM: 137 mmol/L (ref 135–145)
TCO2: 24 mmol/L (ref 22–32)

## 2016-10-20 LAB — POCT ACTIVATED CLOTTING TIME
ACTIVATED CLOTTING TIME: 213 s
ACTIVATED CLOTTING TIME: 197 s
ACTIVATED CLOTTING TIME: 191 s
ACTIVATED CLOTTING TIME: 180 s
ACTIVATED CLOTTING TIME: 186 s
ACTIVATED CLOTTING TIME: 175 s
Activated Clotting Time: 208 seconds
Activated Clotting Time: 219 seconds
Activated Clotting Time: 186 seconds
Activated Clotting Time: 186 seconds
Activated Clotting Time: 186 seconds

## 2016-10-20 LAB — GLUCOSE, CAPILLARY
GLUCOSE-CAPILLARY: 179 mg/dL — AB (ref 65–99)
GLUCOSE-CAPILLARY: 116 mg/dL — AB (ref 65–99)
GLUCOSE-CAPILLARY: 169 mg/dL — AB (ref 65–99)
Glucose-Capillary: 176 mg/dL — ABNORMAL HIGH (ref 65–99)
Glucose-Capillary: 144 mg/dL — ABNORMAL HIGH (ref 65–99)
Glucose-Capillary: 186 mg/dL — ABNORMAL HIGH (ref 65–99)

## 2016-10-20 LAB — COOXEMETRY PANEL
Carboxyhemoglobin: 1.4 % (ref 0.5–1.5)
Methemoglobin: 1 % (ref 0.0–1.5)
O2 Saturation: 71.5 %
Total hemoglobin: 8.7 g/dL — ABNORMAL LOW (ref 12.0–16.0)

## 2016-10-20 LAB — PHOSPHORUS: PHOSPHORUS: 2.5 mg/dL (ref 2.5–4.6)

## 2016-10-20 LAB — MAGNESIUM: MAGNESIUM: 2.3 mg/dL (ref 1.7–2.4)

## 2016-10-20 LAB — PROTIME-INR
INR: 2.42
PROTHROMBIN TIME: 26.1 s — AB (ref 11.4–15.2)

## 2016-10-20 LAB — APTT: APTT: 60 s — AB (ref 24–36)

## 2016-10-20 MED ORDER — PRO-STAT SUGAR FREE PO LIQD
60.0000 mL | Freq: Three times a day (TID) | ORAL | Status: DC
  Administered 2016-10-20 – 2016-10-23 (×10): 60 mL
  Filled 2016-10-20 (×10): qty 60

## 2016-10-20 MED ORDER — ORAL CARE MOUTH RINSE
15.0000 mL | Freq: Two times a day (BID) | OROMUCOSAL | Status: DC
  Administered 2016-10-20 – 2016-10-29 (×10): 15 mL via OROMUCOSAL

## 2016-10-20 MED ORDER — AMIODARONE HCL 200 MG PO TABS
400.0000 mg | ORAL_TABLET | Freq: Two times a day (BID) | ORAL | Status: DC
  Administered 2016-10-20 – 2016-10-22 (×6): 400 mg
  Filled 2016-10-20 (×7): qty 2

## 2016-10-20 MED ORDER — ADULT MULTIVITAMIN LIQUID CH
15.0000 mL | Freq: Every day | ORAL | Status: DC
  Administered 2016-10-20 – 2016-10-30 (×11): 15 mL
  Filled 2016-10-20 (×11): qty 15

## 2016-10-20 MED ORDER — VITAL HIGH PROTEIN PO LIQD
1000.0000 mL | ORAL | Status: DC
  Administered 2016-10-20 – 2016-10-22 (×2): 1000 mL
  Filled 2016-10-20 (×2): qty 1000

## 2016-10-20 NOTE — Progress Notes (Signed)
CSW continuing to follow for transition to SNF when medically stable  Jorge Ny, [place] Social Worker [phone]

## 2016-10-20 NOTE — Progress Notes (Signed)
PULMONARY / CRITICAL CARE MEDICINE   Name: [name] MRN: [id number] DOB: [birth year]    ADMISSION DATE:  10/09/2016 CONSULTATION DATE: 10/15/2016  REFERRING MD:  Roxy Manns  CHIEF COMPLAINT:  Hypoxemic respiratory failrue  HISTORY OF PRESENT ILLNESS:   83 y/o male underwent a CABG and MAZE on 9/27.  Developed worsening renal failure post operatively requiring dialysis, needed intubation for catheter placement.   On 10/3 he developed shock in the setting of a spontaneous R sided pneumothorax.     SUBJECTIVE:  No events overnight, no new complaints, sedate  VITAL SIGNS: BP 121/63   Pulse 94   Temp 97.7 F (36.5 C)   Resp 18   Ht 5\' 8"  (1.727 m)   Wt 100.5 kg (221 lb 9 oz)   SpO2 95%   BMI 33.69 kg/m   HEMODYNAMICS: CVP:  [10 mmHg-12 mmHg] 10 mmHg  VENTILATOR SETTINGS: Vent Mode: CPAP;PSV FiO2 (%):  [40 %-50 %] 50 % Set Rate:  [18 bmp] 18 bmp Vt Set:  [540 mL] 540 mL PEEP:  [5 cmH20-8 cmH20] 5 cmH20 Pressure Support:  [5 cmH20] 5 cmH20 Plateau Pressure:  [27 cmH20] 27 cmH20  INTAKE / OUTPUT: I/O last 3 completed shifts: In: 3687.4 [I.V.:1525.9; Other:170; NG/GT:1071.5; IV Piggyback:920] Out: 8242 [Urine:406; Emesis/NG output:50; PNTIR:4431; Chest Tube:10]  PHYSICAL EXAMINATION:  General:  In bed on vent HENT: [place]/AT, PERRL, EOM-I and MMM, ETT in place PULM: Coarse BS diffusely CV: RRR, Nl S1/S2, -M/R/G GI: BS+, soft, nontender MSK: Normal bulk and tone Neuro: Opens eyes and moving all ext to command  LABS:  BMET  Recent Labs Lab 10/19/16 0426 10/19/16 1524 10/19/16 1534 10/20/16 0328  NA 136 136 136 136  K 3.4* 3.5 3.4* 3.8  CL 97* 101 97* 100*  CO2 26 25  --  25  BUN 23* 22* 23* 24*  CREATININE 1.77* 1.72* 1.70* 1.70*  GLUCOSE 100* 171* 161* 180*   Electrolytes  Recent Labs Lab 10/18/16 0400  10/19/16 0426 10/19/16 0835 10/19/16 1524 10/20/16 0328  CALCIUM 7.2*  < > 7.9*  --  7.8* 8.1*  MG 2.2  --  2.2  --   --  2.3  PHOS  --   < >  --   2.3* 2.9 2.5  < > = values in this interval not displayed.  CBC  Recent Labs Lab 10/18/16 0400  10/19/16 0426 10/19/16 1534 10/20/16 0328  WBC 19.5*  --  20.3*  --  21.5*  HGB 9.3*  < > 8.8* 9.5* 9.2*  HCT 27.3*  < > 26.4* 28.0* 27.9*  PLT 222  --  184  --  200  < > = values in this interval not displayed.  Coag's  Recent Labs Lab 10/18/16 0400 10/19/16 0426 10/20/16 0328  APTT 59* 67* 60*  INR 3.50 3.93 2.42   Sepsis Markers  Recent Labs Lab 10/16/16 1515 10/17/16 1117 10/17/16 1501  LATICACIDVEN 4.6* 2.2* 2.1*   ABG  Recent Labs Lab 10/18/16 0409 10/19/16 0427 10/20/16 0337  PHART 7.489* 7.526* 7.417  PCO2ART 39.8 35.5 42.7  PO2ART 93.2 93.0 76.0*   Liver Enzymes  Recent Labs Lab 10/18/16 0400  10/19/16 0426 10/19/16 1524 10/20/16 0328  AST 608*  --  406*  --  317*  ALT 450*  --  380*  --  375*  ALKPHOS 309*  --  328*  --  366*  BILITOT 1.3*  --  1.4*  --  1.0  ALBUMIN 2.0*  < >  1.9* 1.8* 1.9*  < > = values in this interval not displayed.  Cardiac Enzymes No results for input(s): TROPONINI, PROBNP in the last 168 hours.  Glucose  Recent Labs Lab 10/19/16 0753 10/19/16 1151 10/19/16 1939 10/19/16 2347 10/20/16 0333 10/20/16 0754  GLUCAP 104* 139* 139* 181* 176* 116*   Imaging Dg Chest Port 1 View  Result Date: 10/20/2016 CLINICAL DATA:  83 year old male status post CABG with Maze procedure on 10/09/2016. EXAM: PORTABLE CHEST 1 VIEW COMPARISON:  10/19/2016 and earlier. FINDINGS: Portable AP semi upright view at 0547 hours. Stable endotracheal tube tip at the level the clavicles. Stable left subclavian approach and right IJ central lines. Right side pigtail chest tube is stable. Enteric tube is stable, side hole at the gastric body level. No pneumothorax. Stable lung volumes. Basilar predominant patchy and left lung base confluent opacity has mildly progressed. The left hemidiaphragm is now obscured. Stable cardiac size and mediastinal  contours. Stable mediastinal postoperative changes. IMPRESSION: 1.  Stable lines and tubes. 2. No pneumothorax. 3. Interval worsening ventilation at the left lung base with continued left greater than right basilar predominant patchy opacity. Electronically Signed   By: Genevie Ann M.D.   On: 10/20/2016 07:24   Dg Abd Portable 1v  Result Date: 10/20/2016 CLINICAL DATA:  83 year old male status post CABG with Maze procedure 10/09/2016. EXAM: PORTABLE ABDOMEN - 1 VIEW COMPARISON:  10/19/2016 and earlier. FINDINGS: Portable AP view at 0549 hours. Enteric tube side hole up the level of the gastric body. Minimal gastric air. Epicardial pacer wires project over the upper abdomen. Redundant gas-filled large bowel in the mid abdomen has not significantly changed since 10/16/2016. Paucity of small bowel gas. Partially visible catheter or probe projecting over the central lower pelvis. Stable visualized osseous structures. IMPRESSION: 1. Stable enteric tube, side hole up the gastric body. 2. Bowel gas pattern - redundant gas-filled large bowel in the mid abdomen - is stable since 10/16/2016. Electronically Signed   By: Genevie Ann M.D.   On: 10/20/2016 07:26   STUDIES:  CXR with pulmonary edema 10/3, 10/7 with persistent airspace disease, pigtail catheter remains in good position  CULTURES: 10/4 resp culture > abundant WBC , recultured  ANTIBIOTICS: None  SIGNIFICANT EVENTS: 10/3 hypoxemic respiratory failure requiring intubation  LINES/TUBES: ETT 10/3>>>10/8 L [place] Trialysis catheter 10/3>>> L radial a-line 10/3>>> R IJ single lumen catheter 10/3>>> 10/4 R subclavian CVL 10/4 > 10/5 R pigtail chest drain 10/4 >  R IJ CVL 10/5 >   DISCUSSION: 83 y/o male s/p CABG, MAZE procedure here with persistent acute respiratory failure with hypoxemia in the setting of renal failure requiring HD and persistent bilateral pulmonary infiltrates.    ASSESSMENT / PLAN:  PULMONARY A: Acute respiratory failure with  hypoxemia Bilateral pulmonary infiltrates HCAP Spontaneous pneumothorax on R 10/4 Acute pulmonary edema ARDS  P:   Continue volume negative 100 ml/hr SBT to extubate today if more awake VAP prevention Wean PEEP/FiO2 as able to maintain O2 saturation > 92% Pigtail chest tube to suction today given slight pleural effusion, but if extubated then will change to water seal and repeat CXR in AM  CARDIOVASCULAR A:  CAD, s/p CABG Afib, s/p MAZE Shock 10/3 in setting of pneumothorax P:  Tele Continue volume removal Dopamine and neo for BP support  RENAL A:   Hyponatermia AKI Anasarca P:   Continue volume removal with CVVHD BMET in AM F/U UOP Replace electrolytes as indicated  GASTROINTESTINAL A:   Ileus Shock  liver > improving P:   Repeat LFT TF per nutrition  HEMATOLOGIC A:   Coagulopathy in setting of shock liver, anticoagulant meds P:  Monitor for bleeding  INFECTIOUS A:   HCAP P:   Continue vanc/zosyn Monitor for fever  ENDOCRINE A:   DM2 P:   Monitor glucose, SSI  NEUROLOGIC A:   No acute issues P:   RASS -2 Continue Fentanyl infusion  FAMILY  - Updates: No family bedside to update - Inter-disciplinary family meet or Palliative Care meeting due by:  day 7  The patient is critically ill with multiple organ systems failure and requires high complexity decision making for assessment and support, frequent evaluation and titration of therapies, application of advanced monitoring technologies and extensive interpretation of multiple databases.   Critical Care Time devoted to patient care services described in this note is  35  Minutes. This time reflects time of care of this signee Dr Jennet Maduro. This critical care time does not reflect procedure time, or teaching time or supervisory time of PA/NP/Med student/Med Resident etc but could involve care discussion time.  Rush Farmer, M.D. [facility] Pulmonary/Critical Care Medicine. Pager:  [phone]. After hours pager: [phone].  10/20/2016, 10:23 AM

## 2016-10-20 NOTE — Progress Notes (Signed)
[name] notified that during extubation, cortrak tube was noted to be coiled in mouth.  [name] to come adjust cortrak.  Will continue to monitor pt closely.

## 2016-10-20 NOTE — Progress Notes (Signed)
[facility] ROUNDING NOTE   Subjective:   Interval History:83 y/o male underwent a CABG and MAZE on 9/27.  Developed worsening renal failure post operatively requiring dialysis, needed intubation for catheter placement.   On 10/3 he developed shock in the setting of a spontaneous R sided pneumothorax.   Acute on chronic renal disease stage 4. Baseline creatinine 2.6  Followed by Dr Marval Regal   Now on CRRT for volume overload. Patient with persistent hypotension 10/3   Objective:  Vital signs in last 24 hours:  Temp:  [97.3 F (36.3 C)-99 F (37.2 C)] 97.7 F (36.5 C) (10/08 1000) Pulse Rate:  [84-95] 94 (10/08 1000) Resp:  [13-26] 18 (10/08 1000) BP: (77-136)/(40-80) 121/63 (10/08 1000) SpO2:  [87 %-100 %] 95 % (10/08 1000) Arterial Line BP: (97-156)/(41-56) 156/56 (10/08 1000) FiO2 (%):  [40 %-50 %] 50 % (10/08 0942) Weight:  [221 lb 9 oz (100.5 kg)] 221 lb 9 oz (100.5 kg) (10/08 0500)  Weight change: -15 lb 14 oz (-7.2 kg) Filed Weights   10/18/16 0500 10/19/16 0500 10/20/16 0500  Weight: 242 lb 4.6 oz (109.9 kg) 237 lb 7 oz (107.7 kg) 221 lb 9 oz (100.5 kg)    Intake/Output: I/O last 3 completed shifts: In: 3687.4 [I.V.:1525.9; Other:170; NG/GT:1071.5; IV Piggyback:920] Out: 4585 [Urine:406; Emesis/NG output:50; FYTWK:4628; Chest Tube:10]   Intake/Output this shift:  Total I/O In: 282.6 [I.V.:121.6; NG/GT:140; IV Piggyback:21] Out: 471 [Urine:17; Other:454]  CVS- RRR RS- CTA ABD- BS present soft non-distended EXT- no edema   Basic Metabolic Panel:  Recent Labs Lab 10/16/16 1515 10/17/16 0335  10/18/16 0400 10/18/16 0830 10/18/16 1606 10/18/16 1626 10/19/16 0426 10/19/16 0835 10/19/16 1524 10/19/16 1534 10/20/16 0328  NA 130*  131* 131*  < > 136 136 135 134* 136  --  136 136 136  K 4.9  4.9 3.7  < > 3.6 3.6 3.7 3.8 3.4*  --  3.5 3.4* 3.8  CL 96*  97* 94*  < > 95* 96* 95* 94* 97*  --  101 97* 100*  CO2 18*  18* 23  < > _0 --  26   --  25  --  25  GLUCOSE 126*  126* 132*  < > 102* 106* 135* 142* 100*  --  171* 161* 180*  BUN 35*  35* 33*  < > 24* 23* 24* 26* 23*  --  22* 23* 24*  CREATININE 2.98*  2.95* 2.41*  < > 1.87* 1.83* 1.87* 1.70* 1.77*  --  1.72* 1.70* 1.70*  CALCIUM 7.3*  7.3* 6.7*  < > 7.2* 7.4* 7.5*  --  7.9*  --  7.8*  --  8.1*  MG 2.5* 2.3  --  2.2  --   --   --  2.2  --   --   --  2.3  PHOS 5.2*  5.2* 3.2  3.0  < >  --  2.0* 2.9  --   --  2.3* 2.9  --  2.5  < > = values in this interval not displayed.  Liver Function Tests:  Recent Labs Lab 10/16/16 1515  10/17/16 0642  10/18/16 0400 10/18/16 0830 10/18/16 1606 10/19/16 0426 10/19/16 1524 10/20/16 0328  AST 43*  --  807*  --  608*  --   --  406*  --  317*  ALT 21  --  414*  --  450*  --   --  380*  --  375*  ALKPHOS  170*  --  229*  --  309*  --   --  328*  --  366*  BILITOT 1.2  --  1.3*  --  1.3*  --   --  1.4*  --  1.0  PROT 5.1*  --  4.7*  --  5.3*  --   --  5.1*  --  5.2*  ALBUMIN 2.1*  2.1*  < > 1.9*  < > 2.0* 1.9* 1.9* 1.9* 1.8* 1.9*  < > = values in this interval not displayed.  Recent Labs Lab 10/16/16 1515  LIPASE 23  AMYLASE 25*   No results for input(s): AMMONIA in the last 168 hours.  CBC:  Recent Labs Lab 10/16/16 2355 10/17/16 0335 10/18/16 0400 10/18/16 1626 10/19/16 0426 10/19/16 1534 10/20/16 0328  WBC 15.2* 18.5* 19.5*  --  20.3*  --  21.5*  NEUTROABS  --   --   --   --  17.4*  --  18.5*  HGB 8.4* 9.0* 9.3* 9.2* 8.8* 9.5* 9.2*  HCT 23.6* 26.1* 27.3* 27.0* 26.4* 28.0* 27.9*  MCV 84.9 84.5 85.0  --  86.0  --  88.6  PLT 178 210 222  --  184  --  200    Cardiac Enzymes: No results for input(s): CKTOTAL, CKMB, CKMBINDEX, TROPONINI in the last 168 hours.  BNP: Invalid input(s): POCBNP  CBG:  Recent Labs Lab 10/19/16 1151 10/19/16 1939 10/19/16 2347 10/20/16 0333 10/20/16 0754  GLUCAP 139* 139* 181* 176* 116*    Microbiology: Results for orders placed or performed during the hospital  encounter of 10/09/16  Culture, respiratory (NON-Expectorated)     Status: None   Collection Time: 10/16/16  8:43 AM  Result Value Ref Range Status   Specimen Description TRACHEAL ASPIRATE  Final   Special Requests NONE  Final   Gram Stain   Final    ABUNDANT WBC PRESENT, PREDOMINANTLY PMN NO ORGANISMS SEEN    Culture RARE ENTEROBACTER CLOACAE  Final   Report Status 10/19/2016 FINAL  Final   Organism ID, Bacteria ENTEROBACTER CLOACAE  Final      Susceptibility   Enterobacter cloacae - MIC*    CEFAZOLIN >=64 RESISTANT Resistant     CEFEPIME <=1 SENSITIVE Sensitive     CEFTAZIDIME <=1 SENSITIVE Sensitive     CEFTRIAXONE <=1 SENSITIVE Sensitive     CIPROFLOXACIN <=0.25 SENSITIVE Sensitive     GENTAMICIN <=1 SENSITIVE Sensitive     IMIPENEM 0.5 SENSITIVE Sensitive     TRIMETH/SULFA <=20 SENSITIVE Sensitive     PIP/TAZO <=4 SENSITIVE Sensitive     * RARE ENTEROBACTER CLOACAE    Coagulation Studies:  Recent Labs  10/18/16 0400 10/19/16 0426 10/20/16 0328  LABPROT 34.8* 38.2* 26.1*  INR 3.50 3.93 2.42    Urinalysis: No results for input(s): COLORURINE, LABSPEC, PHURINE, GLUCOSEU, HGBUR, BILIRUBINUR, KETONESUR, PROTEINUR, UROBILINOGEN, NITRITE, LEUKOCYTESUR in the last 72 hours.  Invalid input(s): APPERANCEUR    Imaging: Dg Chest Port 1 View  Result Date: 10/20/2016 CLINICAL DATA:  83 year old male status post CABG with Maze procedure on 10/09/2016. EXAM: PORTABLE CHEST 1 VIEW COMPARISON:  10/19/2016 and earlier. FINDINGS: Portable AP semi upright view at 0547 hours. Stable endotracheal tube tip at the level the clavicles. Stable left subclavian approach and right IJ central lines. Right side pigtail chest tube is stable. Enteric tube is stable, side hole at the gastric body level. No pneumothorax. Stable lung volumes. Basilar predominant patchy and left lung base confluent opacity has mildly progressed.  The left hemidiaphragm is now obscured. Stable cardiac size and  mediastinal contours. Stable mediastinal postoperative changes. IMPRESSION: 1.  Stable lines and tubes. 2. No pneumothorax. 3. Interval worsening ventilation at the left lung base with continued left greater than right basilar predominant patchy opacity. Electronically Signed   By: Genevie Ann M.D.   On: 10/20/2016 07:24   Dg Chest Port 1 View  Result Date: 10/19/2016 CLINICAL DATA:  Followup CABG.  Chest 2.  Ventilator support. EXAM: PORTABLE CHEST 1 VIEW COMPARISON:  10/18/2016 FINDINGS: Endotracheal tube tip is 4 cm above the carina. Nasogastric tube enters the abdomen. Right internal jugular central line is in the SVC above the azygos level. Right pigtail chest tube in place. No visible pneumothorax on the right. Left subclavian central line tip in the SVC at the azygos level. Persistent interstitial and alveolar edema, similar allowing for technical differences. No new finding. IMPRESSION: Lines and tubes well positioned. No pneumothorax. Persistent interstitial and alveolar edema. Electronically Signed   By: Nelson Chimes M.D.   On: 10/19/2016 06:55   Dg Abd Portable 1v  Result Date: 10/20/2016 CLINICAL DATA:  83 year old male status post CABG with Maze procedure 10/09/2016. EXAM: PORTABLE ABDOMEN - 1 VIEW COMPARISON:  10/19/2016 and earlier. FINDINGS: Portable AP view at 0549 hours. Enteric tube side hole up the level of the gastric body. Minimal gastric air. Epicardial pacer wires project over the upper abdomen. Redundant gas-filled large bowel in the mid abdomen has not significantly changed since 10/16/2016. Paucity of small bowel gas. Partially visible catheter or probe projecting over the central lower pelvis. Stable visualized osseous structures. IMPRESSION: 1. Stable enteric tube, side hole up the gastric body. 2. Bowel gas pattern - redundant gas-filled large bowel in the mid abdomen - is stable since 10/16/2016. Electronically Signed   By: Genevie Ann M.D.   On: 10/20/2016 07:26   Dg Abd Portable  1v  Result Date: 10/19/2016 CLINICAL DATA:  Followup ileus EXAM: PORTABLE ABDOMEN - 1 VIEW COMPARISON:  10/18/2016 FINDINGS: Nasogastric tube tip in the region of the antrum. Mild ileus for cysts, not significantly changed since yesterday. No new finding on this image that does not include the entire pelvis. IMPRESSION: No change.  Persistent ileus pattern. Electronically Signed   By: Nelson Chimes M.D.   On: 10/19/2016 06:53     Medications:   . sodium chloride    . sodium chloride    . cefTRIAXone (ROCEPHIN)  IV Stopped (10/19/16 1756)  . DOPamine 3 mcg/kg/min (10/20/16 0800)  . feeding supplement (NEPRO CARB STEADY) 1,000 mL (10/20/16 0800)  . fentaNYL infusion INTRAVENOUS Stopped (10/20/16 0930)  . ferumoxytol Stopped (10/15/16 1407)  . heparin 10,000 units/ 20 mL infusion syringe 250 Units/hr (10/20/16 0420)  . heparin 999 mL/hr at 10/19/16 1816  . phenylephrine (NEO-SYNEPHRINE) Adult infusion Stopped (10/20/16 0845)  . dialysis replacement fluid (prismasate) 300 mL/hr at 10/19/16 2058  . dialysis replacement fluid (prismasate) 200 mL/hr at 10/19/16 1729  . dialysate (PRISMASATE) 1,000 mL/hr at 10/20/16 1016  . sodium phosphate  Dextrose 5% IVPB 20 mmol (10/20/16 0930)   . amiodarone  400 mg Per Tube Q12H  . aspirin  81 mg Per Tube Daily  . bisacodyl  10 mg Rectal Daily  . chlorhexidine gluconate (MEDLINE KIT)  15 mL Mouth Rinse BID  . Chlorhexidine Gluconate Cloth  6 each Topical Q0600  . feeding supplement (PRO-STAT SUGAR FREE 64)  30 mL Per Tube BID  . feeding supplement (VITAL HIGH  PROTEIN)  1,000 mL Per Tube Q24H  . folic acid  1 mg Per Tube Daily  . guaiFENesin  1,200 mg Oral BID  . insulin aspart  0-24 Units Subcutaneous Q4H  . levothyroxine  25 mcg Per Tube QAC breakfast  . mouth rinse  15 mL Mouth Rinse 10 times per day  . metoCLOPramide (REGLAN) injection  10 mg Intravenous Q6H  . midazolam  2 mg Intravenous Once  . moving right along book   Does not apply Once  .  pantoprazole (PROTONIX) IV  40 mg Intravenous Q24H  . patient's guide to using coumadin book   Does not apply Once  . rocuronium  50 mg Intravenous Once  . sodium chloride flush  10-40 mL Intracatheter Q12H  . sodium chloride flush  3 mL Intravenous Q12H  . sodium chloride flush  3 mL Intravenous Q12H   sodium chloride, fentaNYL, heparin, heparin, heparin, midazolam, ondansetron (ZOFRAN) IV, sodium chloride flush, sodium chloride flush, sodium chloride flush  Assessment/ Plan:  83 y.o.year-old with PMH DM, HTN, HLD, chronic dCHF, COPD, PAF, RA, secondary HPT. Known CKD3/4 (DM, HTN, prior NSAID use; followed by Dr. Marval Regal). Last creatinine at [facility] 2.61 08/28/16. Heart cath 10/07/16 90% LM ds. S/p 2V CABG, MAZE procedure, clipping of atrial appendage by Dr. Roxy Manns 10/09/16. Persistent hypotension with insidious worsening of renal function, UOP trailed off despite lasix drip.  Asked to see for hemodynamically driven AKI on CKD4. CRRT initiated 10/3 2/2 massive overload/resp failure, AKI.   Assessment/Recommendations  1. AKI on CKD3/4- post CABG and MAZE procedure. Steady rise in creatinine, no response to ceiling dose diuretics.  Hemodynamically driven (low blood pressures) AKI  in kidneys with little reserve.  1. Left IJ vas cath by CCM (10/3) 2. CRRT initiated 10/15/16 tolerating fine.  3. Heparin with CRRT 4. Add daily IV phos repletion at 20 mmoles.day 5. UF goal tolerating fluid removal 100- 150cc/hour  2. S/P CABG/MAZE procedure - ECHO 10/2;  no pericardial effusion.   3. Acute hypoxic resp failure. Pulm edema. Poss HCAP. Oxygenation improving. Down to 40% FIO2 from 80 and lower PEEP 4. Shock -  Dopamine   1. Sig hypotension 10/4 associated with spontaneous PTX.  2. CCM worried about brewing sepsis. (ATB's (v/z) for possible HCAP) rocephin stopped but noted fever and white count  3. Continues to wean pressors  5. Anemia- ABLA. No prior significant CKD related anemia (had  no outpt ESA requirement) 1. Transfuse for Hb <7 (transfused 9/27) 2. Dosed Aranesp 150 10/2, then will get weekly.  3. Feraheme dosed 10/3, redose 10/10. (tsat 6%) 6. Diabetes with neuropathy 1. Stopped gabapentin (early side effects from accumulation) 7. Hyponatremia - fluid overload. Resolved with CRRT 8. Metabolic acidosis - resolved.    LOS: Fries _0 _1 :52 AM

## 2016-10-20 NOTE — Progress Notes (Signed)
245 cc fentanyl drip wasted in sink followed by water flush by Ezequiel Kayser and Warden Fillers RNs.

## 2016-10-20 NOTE — Progress Notes (Signed)
Cortrak Tube Team Note:  Consult received to place a Cortrak feeding tube.   A 10 F Cortrak tube was placed in the left nare and secured with a nasal bridle at 85 cm. Per the Cortrak monitor reading the tube tip is post pyloric.   No x-ray is required. RN may begin using tube.   If the tube becomes dislodged please keep the tube and contact the Cortrak team at [email or web address] (password [email or web address]) for replacement.  If after hours and replacement cannot be delayed, place a NG tube and confirm placement with an abdominal x-ray.    Koleen Distance MS, RD, LDN Pager #- [phone] After Hours Pager: [phone]

## 2016-10-20 NOTE — Procedures (Signed)
Extubation Procedure Note  Patient Details:   Name: [name] DOB: July 11, 1930 MRN: [id number]   Airway Documentation:  Airway 8 mm (Active)  Secured at (cm) 23 cm 10/20/2016  8:48 AM  Measured From Lips 10/20/2016  8:48 AM  Secured Location Left 10/20/2016  8:48 AM  Secured By [employer] 10/20/2016  8:48 AM  Tube Holder Repositioned Yes 10/20/2016  8:48 AM  Cuff Pressure (cm H2O) 26 cm H2O 10/19/2016  8:23 PM  Site Condition Dry 10/20/2016  8:48 AM    Evaluation  O2 sats: stable throughout Complications: No apparent complications Patient did tolerate procedure well. Bilateral Breath Sounds: Rhonchi   Yes   Patient extubated to 6l [place]. Patient is tolerating well at this time. No complications. RN at bedside. RT will continue to monitor.  Mcneil Sober 10/20/2016, 12:28 PM

## 2016-10-20 NOTE — Progress Notes (Signed)
Nutrition Follow Up Note   DOCUMENTATION CODES:   Obesity unspecified  INTERVENTION:   Change to Vital High Protein @ goal rate of 30 ml/hr 60 ml Prostat TID Provides: 1320 kcal, 153 grams protein, and 601 ml free water.   Add MVI  NUTRITION DIAGNOSIS:   Inadequate oral intake related to inability to eat as evidenced by NPO status.  GOAL:   Provide needs based on ASPEN/SCCM guidelines  MONITOR:   Vent status, Labs, Weight trends, TF tolerance, I & O's  ASSESSMENT:   Pt with PMH of HTN, CHF, stage IV CKD, hyperlipidemia, afib, COPD, DM admitted for CABG x 2 and MAZE. Post op pt had renal and respiratory failure required intubation and CRRT 10/3.    Pt remains intubated. Now s/p R chest tube placement 10/4. Continues on CRRT. Fluid status improving; pt only 3lbs above admit weight today. Anasarca improving. Cortrak tube placed today. Pt initiated on Nepro 10/7; RD will switch to vital as this is an elemental formula and is easier to digest. Pt with recent ileus; tube feeds previously on hold. Pt tolerating tube feeds thus far.   Medications reviewed and include: aspirin, folic acid, synthroid, reglan, protonix, ceftriaxone, dopamine, fentnyl, heparin, Na Phosphate  Labs reviewed: Cl 100(L), BUN 24(H), creat 1.70(H), Ca 8.1(L) adj. 9.78 wnl, P 2.5 wnl. Mg 2.3 wnl, Alk Phos 366(H), alb 1.9(L), AST 317(H), ALT 375(H) Iron 8(L)- 10/3 Lactic acid 2.1(H)- 10/5 Wbc- 21.5(H), Hgb 9.2(L), Hct 27.9(L) cbgs- 100, 171, 161, 180 x 24 hrs AIC 7.2(H)- 9/24  Patient is currently intubated on ventilator support MV: 9.9 L/min Temp (24hrs), Avg:98.1 F (36.7 C), Min:97.3 F (36.3 C), Max:99 F (37.2 C)  Propofol: none   MAP- >57mHg  Diet Order:   NPO  Skin:  Wound (see comment) (incision chest and leg)  Last BM:  10/5-   Height:   Ht Readings from Last 1 Encounters:  10/10/16 _0  (1.727 m)    Weight:   Wt Readings from Last 1 Encounters:  10/20/16 221 lb 9 oz (100.5  kg)  Admission weight 218 l b(99 kg)   Ideal Body Weight:  70 kg  BMI:  Body mass index is 33.69 kg/m.  Estimated Nutritional Needs:   Kcal:  [address] Protein:  >/= 140 grams  Fluid:  > 1.5 L/day  EDUCATION NEEDS:   No education needs identified at this time  CKoleen DistanceMS, RD, LPleasant GrovePager #- [phone] Hours Pager: [phone]

## 2016-10-20 NOTE — Progress Notes (Signed)
      [address]       [place] [address]             [phone]      Extubated earlier today  BP (!) 133/59   Pulse (!) 103   Temp (!) 97.5 F (36.4 C) (Oral)   Resp 20   Ht 5\' 8"  (1.727 m)   Wt 221 lb 9 oz (100.5 kg)   SpO2 100%   BMI 33.69 kg/m    Intake/Output Summary (Last 24 hours) at 10/20/16 1750 Last data filed at 10/20/16 1700  Gross per 24 hour  Intake          2413.93 ml  Output             4928 ml  Net         -2514.07 ml   Creatinine 1.6 down from 1.7  Continue present care  Remo Lipps C. Roxan Hockey, MD [facility] [phone]

## 2016-10-20 NOTE — Progress Notes (Signed)
[address]       [place] [address]             [phone]        CARDIOTHORACIC SURGERY PROGRESS NOTE   R11 Days Post-Op Procedure(s) (LRB): CORONARY ARTERY BYPASS GRAFTING (CABG) x two, using left internal mammary artery and right leg greater saphenous vein harvested endoscopically (N/A) MAZE (N/A) TRANSESOPHAGEAL ECHOCARDIOGRAM (TEE) (N/A) CLIPPING OF ATRIAL APPENDAGE  Subjective: Clinically appears improved. Arouses to verbal stimuli, on ventilator. Good diuresis over the weekend. +BM  Objective: Vital signs: BP Readings from Last 1 Encounters:  10/20/16 (!) 101/56   Pulse Readings from Last 1 Encounters:  10/20/16 91   Resp Readings from Last 1 Encounters:  10/20/16 18   Temp Readings from Last 1 Encounters:  10/20/16 98.1 F (36.7 C)    Hemodynamics: CVP:  [12 mmHg] 12 mmHg  Physical Exam:  Rhythm:   Atrial Fibrillation 90 bom  Breath sounds: Coarse breath sounds, improved  Heart sounds:  Irregularly iregular  Incisions:  Median sternotomy incision is clean, dry, intact  Abdomen:  Soft but mildly distended, still appears to be improving  Extremities:  2+ pitting edema to the bilateral LE, SCI in place, 2+ DP/PT pulses bilaterally   Intake/Output from previous day: 10/07 0701 - 10/08 0700 In: 2942 [I.V.:1125.5; NG/GT:911.5; IV Piggyback:765] Out: 1157 [Urine:151; Emesis/NG output:50] Intake/Output this shift: No intake/output data recorded.  Lab Results:  CBC: Recent Labs  10/19/16 0426 10/19/16 1534 10/20/16 0328  WBC 20.3*  --  21.5*  HGB 8.8* 9.5* 9.2*  HCT 26.4* 28.0* 27.9*  PLT 184  --  200    BMET:  Recent Labs  10/19/16 1524 10/19/16 1534 10/20/16 0328  NA 136 136 136  K 3.5 3.4* 3.8  CL 101 97* 100*  CO2 25  --  25  GLUCOSE 171* 161* 180*  BUN 22* 23* 24*  CREATININE 1.72* 1.70* 1.70*  CALCIUM 7.8*  --  8.1*     PT/INR:   Recent Labs  10/20/16 0328  LABPROT 26.1*  INR 2.42    CBG (last 3)    Recent Labs  10/19/16 1939 10/19/16 2347 10/20/16 0333  GLUCAP 139* 181* 176*    ABG    Component Value Date/Time   PHART 7.417 10/20/2016 0337   PCO2ART 42.7 10/20/2016 0337   PO2ART 76.0 (L) 10/20/2016 0337   HCO3 27.5 10/20/2016 0337   TCO2 29 10/20/2016 0337   ACIDBASEDEF 1.0 10/17/2016 0007   O2SAT 71.5 10/20/2016 0345    CXR: PORTABLE CHEST 1 VIEW  COMPARISON:  10/19/2016 and earlier.  FINDINGS: Portable AP semi upright view at 0547 hours. Stable endotracheal tube tip at the level the clavicles. Stable left subclavian approach and right IJ central lines. Right side pigtail chest tube is stable. Enteric tube is stable, side hole at the gastric body level.  No pneumothorax. Stable lung volumes. Basilar predominant patchy and left lung base confluent opacity has mildly progressed. The left hemidiaphragm is now obscured. Stable cardiac size and mediastinal contours. Stable mediastinal postoperative changes.  IMPRESSION: 1.  Stable lines and tubes. 2. No pneumothorax. 3. Interval worsening ventilation at the left lung base with continued left greater than right basilar predominant patchy opacity.  Electronically Signed   By: Genevie Ann M.D.   On: 10/20/2016 07:24   Assessment/Plan: S/P Procedure(s) (LRB): CORONARY ARTERY BYPASS GRAFTING (CABG) x two, using left internal mammary artery and right leg greater saphenous  vein harvested endoscopically (N/A) MAZE (N/A) TRANSESOPHAGEAL ECHOCARDIOGRAM (TEE) (N/A) CLIPPING OF ATRIAL APPENDAGE  Cardiovascular - Hemodynamically stable. Currently in Atrial Fibrillation at 90bpmon 30 mg/hr Amiodarone. BP 101/56on 0.3 mcg/Hr of Dopamine, 82mcg/min of phenylephrine, no longer requiring vasopressin.   INR - INR improved to 2.42 this morning  Pulmonary -SpO2 96% on on ventilation FiO2 40%, which he has required due to acute respiratory failure. CXR this morning shows no PTX but Interval worsening ventilation at  the left lung base with continued left greater than right basilar predominant patchy opacity. Can begin to wean from ventilator today or tomorrow, most likely not ready for extubation today. Most recent ABG looks good. CCM following  Pneumothorax - Resolved. No output from CT in recent I/O. May be able to remove today/tomorrow.   Elevated WBC - WBC 21.5 this morning. Afebrile. Continue Vancomycin/Zosyn   Anemia- Expected post-operative. H&H 9.2and 27.9. Continue ferrous sulfate and monitor.   Fluid Overload- Expected post-operative. Patient is only about 1.5kg above pre-operative weight. I/O +2,516in the last 24hours. Clinically still has edema. Currently receiving CRRT, was getting 100 ml/hr removed over the weekend. Nephrology following.  Renal Impairement -sCr remains around 1.70 this morning. Currently on CRRT. Nephrology following  Hypokalemia -K+ 3.8this morning. Supplementation ordered, Will need to closely monitor with renal impairment   Elevated LFTs - AST improved to 317, ALT improved to 375. Elevation was most likely a component of MSOF following shock.   Post-operative Ileus- . KUB shows stable bowel gas pattern in the large colon. Abdomen feels softer and less distended on examination this morning. Will continue to monitor this.   CBG -139/181/176 - Continue ACHS glucose checks and SSI PRN.   Continue amiodarone, may switch from IV to PO today Continue dopamine, wean neo drip as tolerates.  Can begin to wean from ventilator today, although likely will not be ready for extubation Continue CRRT, nephrology following Continue Vancomycin, Zosyn, CCM following Monitor INR Monitor H&H Monitor K+  Edison Simon, PA-S 10/20/2016 7:41 AM   I have seen and examined the patient and agree with the assessment and plan as outlined.  Excellent progress over the weekend.  Stable rhythm and hemodynamics off Vasopressin on dopamine 3 mcg/kg/min and very low dose Neo.   O2 sats stable on 40% FiO2 and PEEP=5.  CXR looks much better.  Tolerating CVVHD and I/O's negative over 7 liters but still above preop baseline.  Afebrile but WBC still elevated 21k.  Tolerating low volume tube feeds but no BM last 24 hours, abdomen remains somewhat distended.  G-tube residuals haven't been checked.  Chest tube w/ minimal drainage and no air leak.  Liver enzymes and INR slowly drifting down.  Anticipate possible initiation of SBT's but I suspect he will need and least 2-3 more days of volume removal before he might be ready for trial extubation.  Continue tube feeds and check G-tube residuals.  Convert amiodarone to per tube.    Rexene Alberts, MD 10/20/2016 8:27 AM

## 2016-10-20 NOTE — Progress Notes (Addendum)
Physical Therapy Treatment Patient Details Name: [name] MRN: [id number] DOB: 1930/06/06 Today's Date: 10/20/2016    History of Present Illness Pt admit for CABGx2 and MAZE procedure.     PT Comments    Pt admitted with above diagnosis. Pt currently with functional limitations due to balance and endurance deficits. Pt was able to perform exercises with assist all four extremities.  On CRRT and on vent at 50%FiO2, PEEP of 5.  All VSS.  Pt on weaning mode therefore nurse wanted exercises only.  Pt with delayed ability to follow commands but overall did well with exercises.    Pt will benefit from skilled PT to increase their independence and safety with mobility to allow discharge to the venue listed below.     Follow Up Recommendations  SNF;Supervision/Assistance - 24 hour     Equipment Recommendations  Other (comment) (TBA)    Recommendations for Other Services       Precautions / Restrictions Precautions Precautions: Fall;Sternal Precaution Comments: CRRT and trach on vent Restrictions Weight Bearing Restrictions: Yes (sternal precautions)    Mobility  Bed Mobility               General bed mobility comments: Total assist to slide pt up in bed.  Pt able to perform exercises.    Transfers                    Ambulation/Gait                 Stairs            Wheelchair Mobility    Modified Rankin (Stroke Patients Only)       Balance                                            Cognition Arousal/Alertness: Awake/alert Behavior During Therapy: WFL for tasks assessed/performed Overall Cognitive Status: Impaired/Different from baseline Area of Impairment: Safety/judgement;Problem solving                         Safety/Judgement: Decreased awareness of safety;Decreased awareness of deficits   Problem Solving: Slow processing;Decreased initiation;Difficulty sequencing;Requires verbal cues;Requires tactile  cues General Comments: Delayed follow of commands      Exercises General Exercises - Upper Extremity Shoulder Flexion: AAROM;Both;10 reps;Supine Elbow Flexion: AAROM;Both;10 reps;Supine Elbow Extension: AAROM;Both;10 reps;Supine General Exercises - Lower Extremity Ankle Circles/Pumps: AROM;Both;10 reps;Supine Quad Sets: AROM;Both;10 reps;Supine Heel Slides: AAROM;Both;10 reps;Supine Hip ABduction/ADduction: AAROM;Both;10 reps;Supine    General Comments        Pertinent Vitals/Pain Pain Assessment: Faces Faces Pain Scale: Hurts little more Pain Location: chest incision Pain Descriptors / Indicators: Discomfort Pain Intervention(s): Limited activity within patient's tolerance;Monitored during session;Repositioned;Premedicated before session    Home Living                      Prior Function            PT Goals (current goals can now be found in the care plan section) Progress towards PT goals: Progressing toward goals    Frequency    Min 3X/week      PT Plan Current plan remains appropriate    Co-evaluation              AM-PAC PT "6 Clicks" Daily Activity  Outcome Measure  Difficulty turning over in bed (including adjusting bedclothes, sheets and blankets)?: Unable Difficulty moving from lying on back to sitting on the side of the bed? : Unable Difficulty sitting down on and standing up from a chair with arms (e.g., wheelchair, bedside commode, etc,.)?: Unable Help needed moving to and from a bed to chair (including a wheelchair)?: Total Help needed walking in hospital room?: Total Help needed climbing 3-5 steps with a railing? : Total 6 Click Score: 6    End of Session Equipment Utilized During Treatment: Gait belt;Oxygen (vent with trach) Activity Tolerance: Patient limited by fatigue Patient left: with call bell/phone within reach;in bed;with SCD's reapplied Nurse Communication: Mobility status PT Visit Diagnosis: Unsteadiness on feet  (R26.81);Muscle weakness (generalized) (M62.81);Pain Pain - part of body:  (chest)     Time: 7414-2395 PT Time Calculation (min) (ACUTE ONLY): 29 min  Charges:  $Therapeutic Exercise: 23-37 mins                    G Codes:       Erion Hermans,PT Acute Rehabilitation [phone] (pager)    Denice Paradise 10/20/2016, 1:18 PM

## 2016-10-21 ENCOUNTER — Inpatient Hospital Stay (HOSPITAL_COMMUNITY): Payer: Medicare Other

## 2016-10-21 LAB — CBC WITH DIFFERENTIAL/PLATELET
Basophils Absolute: 0 10*3/uL (ref 0.0–0.1)
Basophils Relative: 0 %
Eosinophils Absolute: 0.4 10*3/uL (ref 0.0–0.7)
Eosinophils Relative: 2 %
HCT: 28.3 % — ABNORMAL LOW (ref 39.0–52.0)
Hemoglobin: 9.1 g/dL — ABNORMAL LOW (ref 13.0–17.0)
Lymphocytes Relative: 6 %
Lymphs Abs: 1.1 10*3/uL (ref 0.7–4.0)
MCH: 28.9 pg (ref 26.0–34.0)
MCHC: 32.2 g/dL (ref 30.0–36.0)
MCV: 89.8 fL (ref 78.0–100.0)
Monocytes Absolute: 1.3 10*3/uL — ABNORMAL HIGH (ref 0.1–1.0)
Monocytes Relative: 7 %
Neutro Abs: 16.1 10*3/uL — ABNORMAL HIGH (ref 1.7–7.7)
Neutrophils Relative %: 85 %
Platelets: 185 10*3/uL (ref 150–400)
RBC: 3.15 MIL/uL — ABNORMAL LOW (ref 4.22–5.81)
RDW: 16.3 % — ABNORMAL HIGH (ref 11.5–15.5)
WBC: 18.9 10*3/uL — ABNORMAL HIGH (ref 4.0–10.5)

## 2016-10-21 LAB — RENAL FUNCTION PANEL
Albumin: 1.9 g/dL — ABNORMAL LOW (ref 3.5–5.0)
Anion gap: 12 (ref 5–15)
BUN: 32 mg/dL — AB (ref 6–20)
CALCIUM: 8 mg/dL — AB (ref 8.9–10.3)
CHLORIDE: 100 mmol/L — AB (ref 101–111)
CO2: 26 mmol/L (ref 22–32)
CREATININE: 1.7 mg/dL — AB (ref 0.61–1.24)
GFR calc non Af Amer: 35 mL/min — ABNORMAL LOW (ref >60)
GFR, EST AFRICAN AMERICAN: 41 mL/min — AB (ref >60)
GLUCOSE: 160 mg/dL — AB (ref 65–99)
Phosphorus: 2.1 mg/dL — ABNORMAL LOW (ref 2.5–4.6)
Potassium: 3.4 mmol/L — ABNORMAL LOW (ref 3.5–5.1)
SODIUM: 138 mmol/L (ref 135–145)

## 2016-10-21 LAB — COMPREHENSIVE METABOLIC PANEL
ALK PHOS: 326 U/L — AB (ref 38–126)
ALT: 312 U/L — AB (ref 17–63)
AST: 201 U/L — ABNORMAL HIGH (ref 15–41)
Albumin: 2.1 g/dL — ABNORMAL LOW (ref 3.5–5.0)
Anion gap: 8 (ref 5–15)
BUN: 31 mg/dL — ABNORMAL HIGH (ref 6–20)
CALCIUM: 8 mg/dL — AB (ref 8.9–10.3)
CO2: 26 mmol/L (ref 22–32)
CREATININE: 1.69 mg/dL — AB (ref 0.61–1.24)
Chloride: 101 mmol/L (ref 101–111)
GFR, EST AFRICAN AMERICAN: 41 mL/min — AB (ref >60)
GFR, EST NON AFRICAN AMERICAN: 35 mL/min — AB (ref >60)
Glucose, Bld: 130 mg/dL — ABNORMAL HIGH (ref 65–99)
Potassium: 5.9 mmol/L — ABNORMAL HIGH (ref 3.5–5.1)
Sodium: 135 mmol/L (ref 135–145)
Total Bilirubin: 2.2 mg/dL — ABNORMAL HIGH (ref 0.3–1.2)
Total Protein: 5.3 g/dL — ABNORMAL LOW (ref 6.5–8.1)

## 2016-10-21 LAB — POCT I-STAT, CHEM 8
BUN: 32 mg/dL — ABNORMAL HIGH (ref 6–20)
CALCIUM ION: 1.02 mmol/L — AB (ref 1.15–1.40)
CHLORIDE: 101 mmol/L (ref 101–111)
Creatinine, Ser: 1.4 mg/dL — ABNORMAL HIGH (ref 0.61–1.24)
GLUCOSE: 159 mg/dL — AB (ref 65–99)
HCT: 28 % — ABNORMAL LOW (ref 39.0–52.0)
Hemoglobin: 9.5 g/dL — ABNORMAL LOW (ref 13.0–17.0)
Potassium: 3.6 mmol/L (ref 3.5–5.1)
Sodium: 139 mmol/L (ref 135–145)
TCO2: 27 mmol/L (ref 22–32)

## 2016-10-21 LAB — GLUCOSE, CAPILLARY
GLUCOSE-CAPILLARY: 161 mg/dL — AB (ref 65–99)
GLUCOSE-CAPILLARY: 131 mg/dL — AB (ref 65–99)
GLUCOSE-CAPILLARY: 147 mg/dL — AB (ref 65–99)
GLUCOSE-CAPILLARY: 225 mg/dL — AB (ref 65–99)
Glucose-Capillary: 184 mg/dL — ABNORMAL HIGH (ref 65–99)

## 2016-10-21 LAB — PROTIME-INR
INR: 1.77
PROTHROMBIN TIME: 20.4 s — AB (ref 11.4–15.2)

## 2016-10-21 LAB — C DIFFICILE QUICK SCREEN W PCR REFLEX
C Diff antigen: NEGATIVE
C Diff interpretation: NOT DETECTED
C Diff toxin: NEGATIVE

## 2016-10-21 LAB — POCT ACTIVATED CLOTTING TIME
ACTIVATED CLOTTING TIME: 186 s
ACTIVATED CLOTTING TIME: 186 s
ACTIVATED CLOTTING TIME: 186 s
ACTIVATED CLOTTING TIME: 197 s
Activated Clotting Time: 169 seconds
Activated Clotting Time: 186 seconds
Activated Clotting Time: 186 seconds
Activated Clotting Time: 186 seconds
Activated Clotting Time: 191 seconds
Activated Clotting Time: 191 seconds
Activated Clotting Time: 197 seconds
Activated Clotting Time: 197 seconds

## 2016-10-21 LAB — MAGNESIUM: Magnesium: 2.5 mg/dL — ABNORMAL HIGH (ref 1.7–2.4)

## 2016-10-21 LAB — PHOSPHORUS: PHOSPHORUS: 2.7 mg/dL (ref 2.5–4.6)

## 2016-10-21 LAB — APTT: APTT: 54 s — AB (ref 24–36)

## 2016-10-21 MED ORDER — WHITE PETROLATUM GEL
Status: AC
  Administered 2016-10-21: 15:00:00
  Filled 2016-10-21: qty 1

## 2016-10-21 MED ORDER — FENTANYL CITRATE (PF) 100 MCG/2ML IJ SOLN
25.0000 ug | INTRAMUSCULAR | Status: DC | PRN
  Administered 2016-10-22 – 2016-10-26 (×3): 25 ug via INTRAVENOUS
  Filled 2016-10-21 (×4): qty 2

## 2016-10-21 MED ORDER — FENTANYL CITRATE (PF) 100 MCG/2ML IJ SOLN
50.0000 ug | INTRAMUSCULAR | Status: DC | PRN
  Administered 2016-10-29 – 2016-10-30 (×2): 50 ug via INTRAVENOUS
  Filled 2016-10-21 (×2): qty 2

## 2016-10-21 MED ORDER — PANTOPRAZOLE SODIUM 40 MG PO PACK
40.0000 mg | PACK | Freq: Every day | ORAL | Status: DC
  Administered 2016-10-22 – 2016-10-29 (×8): 40 mg
  Filled 2016-10-21 (×8): qty 20

## 2016-10-21 MED ORDER — WARFARIN SODIUM 2 MG PO TABS
2.0000 mg | ORAL_TABLET | Freq: Every day | ORAL | Status: DC
  Administered 2016-10-21: 2 mg via ORAL
  Filled 2016-10-21: qty 1

## 2016-10-21 MED ORDER — WARFARIN - PHYSICIAN DOSING INPATIENT
Freq: Every day | Status: DC
  Administered 2016-10-21 – 2016-10-26 (×3)

## 2016-10-21 NOTE — Progress Notes (Signed)
[facility] Progress Note Patient Name: [name] DOB: 06-09-1930 MRN: [id number]   Date of Service  10/21/2016  HPI/Events of Note  Frequent watery stools - Request for Flexiseal.   eICU Interventions  Will order Flexiseal      Intervention Category Intermediate Interventions: Other:  Manny Vitolo Cornelia Copa 10/21/2016, 3:58 AM

## 2016-10-21 NOTE — Progress Notes (Signed)
[facility] ROUNDING NOTE   Subjective:   Interval History: 83 y/o male underwent a CABG and MAZE on 9/27. Developed worsening renal failure post operatively requiring dialysis, needed intubation for catheter placement. On 10/3 he developed shock in the setting of a spontaneous R sided pneumothorax.  Acute on chronic renal disease stage 4. Baseline creatinine 2.6  Followed by Dr Marval Regal   Now on CRRT for volume overload. Patient with persistent hypotension 10/3 however it appears that there is decrease in pressor dependence at this time and patient appears to be slowly making progress  Objective:  Vital signs in last 24 hours:  Temp:  [97.5 F (36.4 C)-98.6 F (37 C)] 98.6 F (37 C) (10/09 0800) Pulse Rate:  [91-105] 100 (10/09 0815) Resp:  [13-31] 21 (10/09 0815) BP: (105-145)/(54-106) 129/65 (10/09 0815) SpO2:  [87 %-100 %] 99 % (10/09 0815) Arterial Line BP: (122-189)/(44-65) 172/58 (10/09 0000) FiO2 (%):  [50 %] 50 % (10/08 0942) Weight:  [226 lb 6.6 oz (102.7 kg)] 226 lb 6.6 oz (102.7 kg) (10/09 0400)  Weight change: 4 lb 13.6 oz (2.2 kg) Filed Weights   10/19/16 0500 10/20/16 0500 10/21/16 0400  Weight: 237 lb 7 oz (107.7 kg) 221 lb 9 oz (100.5 kg) 226 lb 6.6 oz (102.7 kg)    Intake/Output: I/O last 3 completed shifts: In: 2720.9 [I.V.:827.9; Other:140; ZY/SA:6301; IV SWFUXNATF:573] Out: 6426 [Urine:102; UKGUR:4270; Stool:50; Chest Tube:30]   Intake/Output this shift:  Total I/O In: 66 [I.V.:6; NG/GT:60] Out: 142 [Other:142]  CVS- RRR RS- CTA ABD- BS present soft non-distended EXT-  1-2 + lower extremity edema   Basic Metabolic Panel:  Recent Labs Lab 10/17/16 0335  10/18/16 0400  10/19/16 0426 10/19/16 0835 10/19/16 1524 10/19/16 1534 10/20/16 0328 10/20/16 1551 10/20/16 1557 10/21/16 0303  NA 131*  < > 136  < > 136  --  136 136 136 135 137 135  K 3.7  < > 3.6  < > 3.4*  --  3.5 3.4* 3.8 3.8 3.8 5.9*  CL 94*  < > 95*  < > 97*  --   101 97* 100* 98* 100* 101  CO2 23  < > 28  < > 26  --  25  --  25 24  --  26  GLUCOSE 132*  < > 102*  < > 100*  --  171* 161* 180* 174* 177* 130*  BUN 33*  < > 24*  < > 23*  --  22* 23* 24* 25* 25* 31*  CREATININE 2.41*  < > 1.87*  < > 1.77*  --  1.72* 1.70* 1.70* 1.68* 1.60* 1.69*  CALCIUM 6.7*  < > 7.2*  < > 7.9*  --  7.8*  --  8.1* 8.2*  --  8.0*  MG 2.3  --  2.2  --  2.2  --   --   --  2.3  --   --  2.5*  PHOS 3.2  3.0  < >  --   < >  --  2.3* 2.9  --  2.5 3.3  --  2.7  < > = values in this interval not displayed.  Liver Function Tests:  Recent Labs Lab 10/17/16 0642  10/18/16 0400  10/19/16 0426 10/19/16 1524 10/20/16 0328 10/20/16 1551 10/21/16 0303  AST 807*  --  608*  --  406*  --  317*  --  201*  ALT 414*  --  450*  --  380*  --  375*  --  312*  ALKPHOS 229*  --  309*  --  328*  --  366*  --  326*  BILITOT 1.3*  --  1.3*  --  1.4*  --  1.0  --  2.2*  PROT 4.7*  --  5.3*  --  5.1*  --  5.2*  --  5.3*  ALBUMIN 1.9*  < > 2.0*  < > 1.9* 1.8* 1.9* 2.0* 2.1*  < > = values in this interval not displayed.  Recent Labs Lab 10/16/16 1515  LIPASE 23  AMYLASE 25*   No results for input(s): AMMONIA in the last 168 hours.  CBC:  Recent Labs Lab 10/17/16 0335 10/18/16 0400  10/19/16 0426 10/19/16 1534 10/20/16 0328 10/20/16 1557 10/21/16 0303  WBC 18.5* 19.5*  --  20.3*  --  21.5*  --  18.9*  NEUTROABS  --   --   --  17.4*  --  18.5*  --  16.1*  HGB 9.0* 9.3*  < > 8.8* 9.5* 9.2* 9.9* 9.1*  HCT 26.1* 27.3*  < > 26.4* 28.0* 27.9* 29.0* 28.3*  MCV 84.5 85.0  --  86.0  --  88.6  --  89.8  PLT 210 222  --  184  --  200  --  185  < > = values in this interval not displayed.  Cardiac Enzymes: No results for input(s): CKTOTAL, CKMB, CKMBINDEX, TROPONINI in the last 168 hours.  BNP: Invalid input(s): POCBNP  CBG:  Recent Labs Lab 10/20/16 1140 10/20/16 1605 10/20/16 2001 10/20/16 2311 10/21/16 0312  GLUCAP 144* 169* 186* 161* 131*    Microbiology: Results  for orders placed or performed during the hospital encounter of 10/09/16  Culture, respiratory (NON-Expectorated)     Status: None   Collection Time: 10/16/16  8:43 AM  Result Value Ref Range Status   Specimen Description TRACHEAL ASPIRATE  Final   Special Requests NONE  Final   Gram Stain   Final    ABUNDANT WBC PRESENT, PREDOMINANTLY PMN NO ORGANISMS SEEN    Culture RARE ENTEROBACTER CLOACAE  Final   Report Status 10/19/2016 FINAL  Final   Organism ID, Bacteria ENTEROBACTER CLOACAE  Final      Susceptibility   Enterobacter cloacae - MIC*    CEFAZOLIN >=64 RESISTANT Resistant     CEFEPIME <=1 SENSITIVE Sensitive     CEFTAZIDIME <=1 SENSITIVE Sensitive     CEFTRIAXONE <=1 SENSITIVE Sensitive     CIPROFLOXACIN <=0.25 SENSITIVE Sensitive     GENTAMICIN <=1 SENSITIVE Sensitive     IMIPENEM 0.5 SENSITIVE Sensitive     TRIMETH/SULFA <=20 SENSITIVE Sensitive     PIP/TAZO <=4 SENSITIVE Sensitive     * RARE ENTEROBACTER CLOACAE    Coagulation Studies:  Recent Labs  10/19/16 0426 10/20/16 0328 10/21/16 0303  LABPROT 38.2* 26.1* 20.4*  INR 3.93 2.42 1.77    Urinalysis: No results for input(s): COLORURINE, LABSPEC, PHURINE, GLUCOSEU, HGBUR, BILIRUBINUR, KETONESUR, PROTEINUR, UROBILINOGEN, NITRITE, LEUKOCYTESUR in the last 72 hours.  Invalid input(s): APPERANCEUR    Imaging: Dg Chest Port 1 View  Result Date: 10/21/2016 CLINICAL DATA:  CHEST TUBE PRESENT ,RESP FAILURE EXAM: PORTABLE CHEST 1 VIEW COMPARISON:  One day prior FINDINGS: Endotracheal tube no longer identified and has presumably been removed. Right internal jugular line terminates at the high SVC. Feeding tube is difficult to visualized inferiorly but likely extends beyond the inferior aspect of the film. Reverse apical lordotic positioning. Right-sided pigtail catheter remains. Left-sided central  line from a subclavian approach with tip at mid SVC. Prior median sternotomy. Left atrial appendage occlusion device.  Cardiomegaly accentuated by AP portable technique. No definite pleural fluid. No pneumothorax. Worsening relatively diffuse interstitial and airspace disease, with relative sparing of the apices. Chin overlies the apices minimally. IMPRESSION: Worsened aeration, with progressive interstitial and airspace disease. Suspicious for pulmonary edema. Concurrent infection cannot be excluded. Presumed extubation. Right-sided chest tube in place; no pneumothorax. Electronically Signed   By: Abigail Miyamoto M.D.   On: 10/21/2016 07:16   Dg Chest Port 1 View  Result Date: 10/20/2016 CLINICAL DATA:  83 year old male status post CABG with Maze procedure on 10/09/2016. EXAM: PORTABLE CHEST 1 VIEW COMPARISON:  10/19/2016 and earlier. FINDINGS: Portable AP semi upright view at 0547 hours. Stable endotracheal tube tip at the level the clavicles. Stable left subclavian approach and right IJ central lines. Right side pigtail chest tube is stable. Enteric tube is stable, side hole at the gastric body level. No pneumothorax. Stable lung volumes. Basilar predominant patchy and left lung base confluent opacity has mildly progressed. The left hemidiaphragm is now obscured. Stable cardiac size and mediastinal contours. Stable mediastinal postoperative changes. IMPRESSION: 1.  Stable lines and tubes. 2. No pneumothorax. 3. Interval worsening ventilation at the left lung base with continued left greater than right basilar predominant patchy opacity. Electronically Signed   By: Genevie Ann M.D.   On: 10/20/2016 07:24   Dg Abd Portable 1v  Result Date: 10/20/2016 CLINICAL DATA:  NG tube placement. EXAM: PORTABLE ABDOMEN - 1 VIEW COMPARISON:  Abdominal radiographs same date. FINDINGS: Feeding tube tip is in the body region of the stomach. A right-sided Cope loop catheter is stable. The left subclavian double-lumen catheter is stable. Vascular crowding, atelectasis and probable edema. Right IJ catheter tip is in the proximal SVC. IMPRESSION:  Vascular crowding, interstitial edema and atelectasis. Feeding tube tip is in the body region of the stomach. Other support apparatus as above. Electronically Signed   By: Marijo Sanes M.D.   On: 10/20/2016 14:09   Dg Abd Portable 1v  Result Date: 10/20/2016 CLINICAL DATA:  83 year old male status post CABG with Maze procedure 10/09/2016. EXAM: PORTABLE ABDOMEN - 1 VIEW COMPARISON:  10/19/2016 and earlier. FINDINGS: Portable AP view at 0549 hours. Enteric tube side hole up the level of the gastric body. Minimal gastric air. Epicardial pacer wires project over the upper abdomen. Redundant gas-filled large bowel in the mid abdomen has not significantly changed since 10/16/2016. Paucity of small bowel gas. Partially visible catheter or probe projecting over the central lower pelvis. Stable visualized osseous structures. IMPRESSION: 1. Stable enteric tube, side hole up the gastric body. 2. Bowel gas pattern - redundant gas-filled large bowel in the mid abdomen - is stable since 10/16/2016. Electronically Signed   By: Genevie Ann M.D.   On: 10/20/2016 07:26     Medications:   . sodium chloride    . sodium chloride    . cefTRIAXone (ROCEPHIN)  IV Stopped (10/20/16 1843)  . DOPamine 1 mcg/kg/min (10/21/16 0841)  . feeding supplement (VITAL HIGH PROTEIN) 1,000 mL (10/21/16 0700)  . ferumoxytol Stopped (10/15/16 1407)  . heparin 10,000 units/ 20 mL infusion syringe 750 Units/hr (10/21/16 0006)  . heparin 999 mL/hr at 10/19/16 1816  . dialysis replacement fluid (prismasate) 300 mL/hr at 10/21/16 0637  . dialysis replacement fluid (prismasate) 200 mL/hr at 10/20/16 1846  . dialysate (PRISMASATE) 1,000 mL/hr at 10/21/16 7121  . sodium phosphate  Dextrose  5% IVPB Stopped (10/20/16 1530)   . amiodarone  400 mg Per Tube Q12H  . aspirin  81 mg Per Tube Daily  . chlorhexidine gluconate (MEDLINE KIT)  15 mL Mouth Rinse BID  . Chlorhexidine Gluconate Cloth  6 each Topical Q0600  . feeding supplement (PRO-STAT  SUGAR FREE 64)  60 mL Per Tube TID  . folic acid  1 mg Per Tube Daily  . guaiFENesin  1,200 mg Oral BID  . insulin aspart  0-24 Units Subcutaneous Q4H  . levothyroxine  25 mcg Per Tube QAC breakfast  . mouth rinse  15 mL Mouth Rinse q12n4p  . midazolam  2 mg Intravenous Once  . moving right along book   Does not apply Once  . multivitamin  15 mL Per Tube Daily  . pantoprazole (PROTONIX) IV  40 mg Intravenous Q24H  . patient's guide to using coumadin book   Does not apply Once  . sodium chloride flush  10-40 mL Intracatheter Q12H  . sodium chloride flush  3 mL Intravenous Q12H  . sodium chloride flush  3 mL Intravenous Q12H   sodium chloride, fentaNYL (SUBLIMAZE) injection, fentaNYL (SUBLIMAZE) injection, heparin, heparin, heparin, midazolam, ondansetron (ZOFRAN) IV, sodium chloride flush, sodium chloride flush, sodium chloride flush  Assessment/ Plan:  83 y.o.year-old with PMH DM, HTN, HLD, chronic dCHF, COPD, PAF, RA, secondary HPT. Known CKD3/4 (DM, HTN, prior NSAID use; followed by Dr. Marval Regal). Last creatinine at [facility] 2.61 08/28/16. Heart cath 10/07/16 90% LM ds. S/p 2V CABG, MAZE procedure, clipping of atrial appendage by Dr. Roxy Manns 10/09/16. Persistent hypotension with insidious worsening of renal function, UOP trailed off despite lasix drip. Asked to see for hemodynamically driven AKI on CKD4. CRRT initiated 10/3 2/2 massive overload/resp failure, AKI.   Assessment/Recommendations  1. AKI on CKD3/4- post CABG and MAZE procedure. Steady rise in creatinine, no response to ceiling dose diuretics. Hemodynamically driven (low blood pressures) AKI in kidneys with little reserve.  1. Left IJ vas cath by CCM (10/3) 2. CRRT initiated 10/15/16 tolerating fine.  3. Heparin with CRRT  Recent increase to ceiling dose no issues though with clotting or bleeding  4. Add daily IV phos repletion at 20 mmoles.day UF goal tolerating fluid removal 100- 150cc/hour   DID SUGGEST DECREASE  GOAL  2. S/P CABG/MAZE procedure - ECHO 10/2; no pericardial effusion.   3. Acute hypoxic resp failure. Pulm edema. Poss HCAP. Oxygenation improving and patient extubated successfully 10/8 4. Shock -  Dopamine   1. Sig hypotension 10/4 associated with spontaneous PTX.  2. CCM worried about brewing sepsis. (ATB's (v/z) for possible HCAP) rocephin stopped but noted fever and white count  3. Continues to wean pressors  5. Anemia- ABLA. No prior significant CKD related anemia (had no outpt ESA requirement) 1. Transfuse for Hb <7 (transfused 9/27) 2. Dosed Aranesp 150 10/2, then will get weekly.  3. Feraheme dosed 10/3, redose 10/10. (tsat 6%) 6. Diabetes with neuropathy 1. Stopped gabapentin (early side effects from accumulation) 7. Hyponatremia - fluid overload. Resolved with CRRT 8. Metabolic acidosis - resolved.   Looks to be doing well although not really that hopeful that he will avoid dialysis after CRRT due to poor renal function at baseline  Will continue CRRT today  LOS: 12 [name] W _0 _1 :03 AM

## 2016-10-21 NOTE — Significant Event (Signed)
EPWs removed without complications. No tissues or blood noted on all end tips. Will remove chest tube after dangle. Confirm with CCM as well regarding CT removal-and Dr. Nelda Marseille is in agreement with RN removing it today.    Cambren Helm

## 2016-10-21 NOTE — Progress Notes (Signed)
[address]       [place], [address]             [phone]        CARDIOTHORACIC SURGERY PROGRESS NOTE   R12 Days Post-Op Procedure(s) (LRB): CORONARY ARTERY BYPASS GRAFTING (CABG) x two, using left internal mammary artery and right leg greater saphenous vein harvested endoscopically (N/A) MAZE (N/A) TRANSESOPHAGEAL ECHOCARDIOGRAM (TEE) (N/A) CLIPPING OF ATRIAL APPENDAGE  Subjective: Doing well this morning, alerat and awake in bed, extubated yesterday, some complaints of pain, multiple loose BMs per nursing staff  Objective: Vital signs: BP Readings from Last 1 Encounters:  10/21/16 105/74   Pulse Readings from Last 1 Encounters:  10/21/16 (!) 102   Resp Readings from Last 1 Encounters:  10/21/16 16   Temp Readings from Last 1 Encounters:  10/21/16 98.2 F (36.8 C) (Oral)    Hemodynamics: CVP:  [10 mmHg] 10 mmHg  Physical Exam:  Rhythm:   Sinus Tachycardia 101 bpm  Breath sounds: Diminished bilaterally  Heart sounds:  Tachycardic, regular, no m/r/g  Incisions:  Median sternotomy incision is clean, dry, intact without erythema or drainage  Abdomen:  Soft, less distended than prior, non-tender  Extremities:  1+ pitting edema bilateral LE, SCI in place, 2+ PT/DP Pulses bilaterally   Intake/Output from previous day: 10/08 0701 - 10/09 0700 In: 1419 [I.V.:256; NG/GT:855; IV Piggyback:308] Out: 2585 [Urine:37; Stool:50; Chest Tube:30] Intake/Output this shift: No intake/output data recorded.  Lab Results:  CBC: Recent Labs  10/20/16 0328 10/20/16 1557 10/21/16 0303  WBC 21.5*  --  18.9*  HGB 9.2* 9.9* 9.1*  HCT 27.9* 29.0* 28.3*  PLT 200  --  185    BMET:  Recent Labs  10/20/16 1551 10/20/16 1557 10/21/16 0303  NA 135 137 135  K 3.8 3.8 5.9*  CL 98* 100* 101  CO2 24  --  26  GLUCOSE 174* 177* 130*  BUN 25* 25* 31*  CREATININE 1.68* 1.60* 1.69*  CALCIUM 8.2*  --  8.0*     PT/INR:   Recent Labs  10/21/16 0303    LABPROT 20.4*  INR 1.77    CBG (last 3)   Recent Labs  10/20/16 2001 10/20/16 2311 10/21/16 0312  GLUCAP 186* 161* 131*    ABG    Component Value Date/Time   PHART 7.417 10/20/2016 0337   PCO2ART 42.7 10/20/2016 0337   PO2ART 76.0 (L) 10/20/2016 0337   HCO3 27.5 10/20/2016 0337   TCO2 24 10/20/2016 1557   ACIDBASEDEF 1.0 10/17/2016 0007   O2SAT 71.5 10/20/2016 0345    CXR: PORTABLE CHEST 1 VIEW  COMPARISON:  One day prior  FINDINGS: Endotracheal tube no longer identified and has presumably been removed. Right internal jugular line terminates at the high SVC. Feeding tube is difficult to visualized inferiorly but likely extends beyond the inferior aspect of the film. Reverse apical lordotic positioning. Right-sided pigtail catheter remains. Left-sided central line from a subclavian approach with tip at mid SVC. Prior median sternotomy. Left atrial appendage occlusion device. Cardiomegaly accentuated by AP portable technique. No definite pleural fluid. No pneumothorax. Worsening relatively diffuse interstitial and airspace disease, with relative sparing of the apices. Chin overlies the apices minimally.  IMPRESSION: Worsened aeration, with progressive interstitial and airspace disease. Suspicious for pulmonary edema. Concurrent infection cannot be excluded.  Presumed extubation.  Right-sided chest tube in place; no pneumothorax.   Electronically Signed   By: Abigail Miyamoto M.D.   On:  10/21/2016 07:16   Assessment/Plan: S/P Procedure(s) (LRB): CORONARY ARTERY BYPASS GRAFTING (CABG) x two, using left internal mammary artery and right leg greater saphenous vein harvested endoscopically (N/A) MAZE (N/A) TRANSESOPHAGEAL ECHOCARDIOGRAM (TEE) (N/A) CLIPPING OF ATRIAL APPENDAGE  Cardiovascular - Hemodynamically stable. Currently in sinus tachycardia at 101bpmon 400 mg Amiodarone BID. BP 105/74on 0.3 mcg/Hr of Dopamine which will be weaned today as  tolerated. Will D/C pacing wires  INR - INR improved to 1.77 this morning, will restart coumadin today and monitor INR  Pulmonary -Extubated around 1200 yesterday. SpO2 100% on 4L [place]%. CXR this morning shows worsened aeration, with progressive interstitial and airspace disease. Suspicious for pulmonary edema. Concurrent infection cannot be excluded. Presumed extubation. Right-sided chest tube in place; no pneumothorax.  Pneumothorax- Resolved. No output from CT in recent I/O. Will remove these today  Elevated WBC- WBC 18.9 this morning. Afebrile. Continue Vancomycin/Zosyn   Anemia- Expected post-operative. H&H 9.1and 28.3. Continue ferrous sulfate and monitor.   Fluid Overload- Expected post-operative. Patient is back to about 4kg above pre-operative weight. I/O +2,449in the last 24hours.  Currently receiving CRRT, about 140 ml/hr, Nephrology following.  Renal Impairement -sCr around 1.69this morning. Currently on CRRT. Nephrology following  Hypokalemia -K+ 5.9this morning. Likely hold supplementation today and monitor   Elevated LFTs -AST improved to 201, ALT improved to 312. Elevation was most likely a component of MSOF following shock.   Post-operative Ileus- . Flexiseal in place due to large loose stool secondary to TPN  CBG -186/161/131- Continue ACHS glucose checks and SSI PRN.   Deconditioned - Continue PT, ST to do swallow study post extubation  Wean Dopamine as tolerated D/C Chest Tubes and EPW Restart Coumadin today and monitor INR Continue CRRT per nephrology Monitor K+, WBC, H&H, sCr, LFTs Continue PT/ST, Mobilize  Edison Simon, PA-S 10/21/2016 7:23 AM  I have seen and examined the patient and agree with the assessment and plan as outlined.  Looks remarkably good under the circumstances.  Extubated yesterday and breathing comfortably w/ O2 sats 96-100% on 4 L/min although CXR still w/ diffuse opacity c/w pulmonary edema.  Now in NSR w/  stable BP off Neo on Dopamine @ 3 mcg/kg/min.  Tolerating CVVHD taking 100 mL/hr volume off, negative 2.4 L yesterday.  Very weak but participating w/ physical rehab.  Loose watery stool c/w resolving ileus and tube feeds.  Afebrile and WBC trending down slightly.  Previous ET aspirate culture grew "rare Enterobacter cloacae" sensitive to Rocephin.  LFTs continue to normalize.  Will wean dopamine if BP will tolerate.  Defer management of CVVHD to Nephrology team, but I favor continued volume removal until patient closer to preop baseline.  Unclear whether or not patient's intrinsic renal function may recover.  Continue PT.  Continue tube feeds for now and get SLP eval.  Restart warfarin.  Mobilize as much as possible.   Rexene Alberts, MD 10/21/2016 8:09 AM

## 2016-10-21 NOTE — Progress Notes (Signed)
CRRT filter changed due to clotting off. Patient stable throughout process.

## 2016-10-21 NOTE — Consult Note (Signed)
   [facility] CM Inpatient Consult   10/21/2016  [name] Feb 24, 1930 [id number]  Patient recently extubated on 10/20/16 in the Medicare ACO. Patient admitted for a CABG x 2 and MAZE.HX of HF, COPD, bronchiectasis, and Chronic Kidney disease stage IV. Chart review reveals the patient is being recommended for a skilled nursing facility at discharge.  Patient has chronic dyspnea on exertion with low tolerance currently.  Will follow for disposition but currently no community care management needs.  For questions, please contact:  Natividad Brood, RN BSN [facility] Liaison  [phone] business mobile phone Toll free office [phone]

## 2016-10-21 NOTE — Progress Notes (Signed)
PULMONARY / CRITICAL CARE MEDICINE   Name: [name] MRN: [id number] DOB: 01/10/1931    ADMISSION DATE:  10/09/2016 CONSULTATION DATE: 10/15/2016  REFERRING MD:  Roxy Manns  CHIEF COMPLAINT:  Hypoxemic respiratory failrue  HISTORY OF PRESENT ILLNESS:   83 y/o male underwent a CABG and MAZE on 9/27.  Developed worsening renal failure post operatively requiring dialysis, needed intubation for catheter placement.   On 10/3 he developed shock in the setting of a spontaneous R sided pneumothorax.     SUBJECTIVE:  Remains extubated. Weaning pressors, Abx changed to roc due to enterobacter(sputum) ss to roc. His chest tube is to  be removed. CxR looks wet.  VITAL SIGNS: BP 129/65 (BP Location: Right Arm)   Pulse 100   Temp 98.6 F (37 C) (Oral)   Resp (!) 21   Ht 5\' 8"  (1.727 m)   Wt 226 lb 6.6 oz (102.7 kg)   SpO2 99%   BMI 34.43 kg/m   HEMODYNAMICS:    VENTILATOR SETTINGS: Vent Mode: CPAP;PSV FiO2 (%):  [40 %-50 %] 50 % PEEP:  [5 cmH20] 5 cmH20 Pressure Support:  [5 cmH20] 5 cmH20  INTAKE / OUTPUT: I/O last 3 completed shifts: In: 2720.9 [I.V.:827.9; Other:140; IR/JJ:8841; IV [id number]:109] Out: 6426 [Urine:102; NATFT:7322; Stool:50; Chest Tube:30]  PHYSICAL EXAMINATION:  General:  Obese, ill appearing male off vent, remains on low dose dopa HEENT: MM pink/moist, no neck GUR:KYHC affect Neuro: intact CV:HSD RRR, no mumur ST102 PULM: diminished bilat WC:BJSE, non-tender, bsx4 active , flexiseal in place Extremities: warm/dry, +++ edema , lower ext cool, left great toe amputation noted Skin: no rashes or lesions  LABS:  BMET  Recent Labs Lab 10/20/16 0328 10/20/16 1551 10/20/16 1557 10/21/16 0303  NA 136 135 137 135  K 3.8 3.8 3.8 5.9*  CL 100* 98* 100* 101  CO2 25 24  --  26  BUN 24* 25* 25* 31*  CREATININE 1.70* 1.68* 1.60* 1.69*  GLUCOSE 180* 174* 177* 130*   Electrolytes  Recent Labs Lab 10/19/16 0426  10/20/16 0328 10/20/16 1551 10/21/16 0303   CALCIUM 7.9*  < > 8.1* 8.2* 8.0*  MG 2.2  --  2.3  --  2.5*  PHOS  --   < > 2.5 3.3 2.7  < > = values in this interval not displayed.  CBC  Recent Labs Lab 10/19/16 0426  10/20/16 0328 10/20/16 1557 10/21/16 0303  WBC 20.3*  --  21.5*  --  18.9*  HGB 8.8*  < > 9.2* 9.9* 9.1*  HCT 26.4*  < > 27.9* 29.0* 28.3*  PLT 184  --  200  --  185  < > = values in this interval not displayed.  Coag's  Recent Labs Lab 10/19/16 0426 10/20/16 0328 10/21/16 0303  APTT 67* 60* 54*  INR 3.93 2.42 1.77   Sepsis Markers  Recent Labs Lab 10/16/16 1515 10/17/16 1117 10/17/16 1501  LATICACIDVEN 4.6* 2.2* 2.1*   ABG  Recent Labs Lab 10/18/16 0409 10/19/16 0427 10/20/16 0337  PHART 7.489* 7.526* 7.417  PCO2ART 39.8 35.5 42.7  PO2ART 93.2 93.0 76.0*   Liver Enzymes  Recent Labs Lab 10/19/16 0426  10/20/16 0328 10/20/16 1551 10/21/16 0303  AST 406*  --  317*  --  201*  ALT 380*  --  375*  --  312*  ALKPHOS 328*  --  366*  --  326*  BILITOT 1.4*  --  1.0  --  2.2*  ALBUMIN 1.9*  < >  1.9* 2.0* 2.1*  < > = values in this interval not displayed.  Cardiac Enzymes No results for input(s): TROPONINI, PROBNP in the last 168 hours.  Glucose  Recent Labs Lab 10/20/16 0754 10/20/16 1140 10/20/16 1605 10/20/16 2001 10/20/16 2311 10/21/16 0312  GLUCAP 116* 144* 169* 186* 161* 131*   Imaging Dg Chest Port 1 View  Result Date: 10/21/2016 CLINICAL DATA:  CHEST TUBE PRESENT ,RESP FAILURE EXAM: PORTABLE CHEST 1 VIEW COMPARISON:  One day prior FINDINGS: Endotracheal tube no longer identified and has presumably been removed. Right internal jugular line terminates at the high SVC. Feeding tube is difficult to visualized inferiorly but likely extends beyond the inferior aspect of the film. Reverse apical lordotic positioning. Right-sided pigtail catheter remains. Left-sided central line from a subclavian approach with tip at mid SVC. Prior median sternotomy. Left atrial appendage  occlusion device. Cardiomegaly accentuated by AP portable technique. No definite pleural fluid. No pneumothorax. Worsening relatively diffuse interstitial and airspace disease, with relative sparing of the apices. Chin overlies the apices minimally. IMPRESSION: Worsened aeration, with progressive interstitial and airspace disease. Suspicious for pulmonary edema. Concurrent infection cannot be excluded. Presumed extubation. Right-sided chest tube in place; no pneumothorax. Electronically Signed   By: Abigail Miyamoto M.D.   On: 10/21/2016 07:16   Dg Abd Portable 1v  Result Date: 10/20/2016 CLINICAL DATA:  NG tube placement. EXAM: PORTABLE ABDOMEN - 1 VIEW COMPARISON:  Abdominal radiographs same date. FINDINGS: Feeding tube tip is in the body region of the stomach. A right-sided Cope loop catheter is stable. The left subclavian double-lumen catheter is stable. Vascular crowding, atelectasis and probable edema. Right IJ catheter tip is in the proximal SVC. IMPRESSION: Vascular crowding, interstitial edema and atelectasis. Feeding tube tip is in the body region of the stomach. Other support apparatus as above. Electronically Signed   By: Marijo Sanes M.D.   On: 10/20/2016 14:09   STUDIES:  10/9 cxr with poor aeration  CULTURES: 10/4 resp culture > abundant WBC , enterobacter ss zoysn/roc  ANTIBIOTICS: 10/8 roc>>  SIGNIFICANT EVENTS: 10/3 hypoxemic respiratory failure requiring intubation 104 ptx chest tube placed , removed 10/9  LINES/TUBES: ETT 10/3>>>10/8 L [place] Trialysis catheter 10/3>>> L radial a-line 10/3>>> R IJ single lumen catheter 10/3>>> 10/4 R subclavian CVL 10/4 > 10/5 R pigtail chest drain 10/4 > 10/9 R IJ CVL 10/5 >   DISCUSSION: 83 y/o male s/p CABG, MAZE procedure here with persistent acute respiratory failure with hypoxemia in the setting of renal failure requiring HD and persistent bilateral pulmonary infiltrates.  Extubated 10/8, maintaining negative I/o, weaning  dopa.  ASSESSMENT / PLAN:  PULMONARY A: Acute respiratory failure with hypoxemia, resolved extubated 10/8 Bilateral pulmonary infiltrates HCAP enterobacter ss zoysn/roc Spontaneous pneumothorax on R 10/4, resolved 10/9 Acute pulmonary edema ARDS  P:   Extubated 10/8 Pulmonary toilet DC chest tube Continue negative I/o per renal CRRT  CARDIOVASCULAR A:  CAD, s/p CABG Afib, s/p MAZE Shock 10/3 in setting of pneumothorax P:  Tele Continue volume removal Dopamine weaning for BP support CT removed per cvts  RENAL Lab Results  Component Value Date   CREATININE 1.69 (H) 10/21/2016   CREATININE 1.60 (H) 10/20/2016   CREATININE 1.68 (H) 10/20/2016    Recent Labs Lab 10/20/16 1551 10/20/16 1557 10/21/16 0303  K 3.8 3.8 5.9*     Recent Labs Lab 10/20/16 1551 10/20/16 1557 10/21/16 0303  NA 135 137 135    Intake/Output Summary (Last 24 hours) at 10/21/16 0852 Last  data filed at 10/21/16 0800  Gross per 24 hour  Intake          1416.93 ml  Output             3841 ml  Net         -2424.07 ml     A:   Hyponatermia AKI Anasarca P:   Continue volume removal with CVVHD per renal, goal neg I/o BMET in AM F/U UOP Replace electrolytes as indicated  GASTROINTESTINAL A:   Ileus Shock liver > improving P:   Repeat LFT's are improving Swallow eval ordered  HEMATOLOGIC Lab Results  Component Value Date   INR 1.77 10/21/2016   INR 2.42 10/20/2016   INR 3.93 10/19/2016    A:   Coagulopathy in setting of shock liver, anticoagulant meds P:  Monitor for bleeding Restart coumadin 10/8 per CVTS  INFECTIOUS A:   HCAP with enterobacter SS zoysn P:   DC vanc/zosyn Roc per cvts Monitor for fever  ENDOCRINE CBG (last 3)   Recent Labs  10/20/16 2001 10/20/16 2311 10/21/16 0312  GLUCAP 186* 161* 131*     A:   DM2 P:   Monitor glucose, SSI  NEUROLOGIC A:   No acute issues P:   RASS -0 Holding sedation post extubation  FAMILY  -  Updates: No family bedside to update - Inter-disciplinary family meet or Palliative Care meeting due by:  day 7  App CCT 30 min  Richardson Landry Minor ACNP Maryanna Shape PCCM Pager [phone] till 3 pm If no answer page [phone] 10/21/2016, 8:46 AM  Attending Note:  83 year old male s/p CABG, MAZE procedure and spontaneous PTX on the right.  Patient decompensated with renal failure and respiratory failure and was intubated to be extubated on 10/8.  He remains with significant anasarca on exam with diffuse crackles.  I reviewed CXR myself, PTX resolved but infiltrate worsened.  Will remove CT.  Continue volume negative.  Hold off intubation for now.  Continue pressor support.  PCCM will continue to follow.  The patient is critically ill with multiple organ systems failure and requires high complexity decision making for assessment and support, frequent evaluation and titration of therapies, application of advanced monitoring technologies and extensive interpretation of multiple databases.   Critical Care Time devoted to patient care services described in this note is  35  Minutes. This time reflects time of care of this signee Dr Jennet Maduro. This critical care time does not reflect procedure time, or teaching time or supervisory time of PA/NP/Med student/Med Resident etc but could involve care discussion time.  Rush Farmer, M.D. [facility]. Pager: [phone]. After hours pager: [phone].

## 2016-10-21 NOTE — Progress Notes (Signed)
Physical Therapy Treatment Patient Details Name: BHARGAV BARBARO MRN: [id number] DOB: 1930/05/10 Today's Date: 10/21/2016    History of Present Illness Pt admit for CABGx2 and MAZE procedure on 9/27.  He developed worsening renal failure post operatively requiring CRRT, Intubation 9/27-10/8.   On 10/3 he developed shock in the setting of a spontaneous R sided pneumothorax.  PMHx: hypertension, chronic diastolic congestive heart failure, stage IV chronic kidney disease, hyperlipidemia, recurrent PAF on long-term anticoagulation, bronchiectasis with chronic dyspnea on exertion, COPD, asthma, RA, DM, and anemia     PT Comments    Pt pleasant but appears fatigued with difficulty maintaining attention throughout session. Pt in chair on arrival and unaware of sternal precautions with education for all and pt unable to verbalize end of session. Pt with decreased strength, transfers and function from prior sessions with CRRT limiting mobility along with strength and goals downgraded appropriately. Will continue to follow to maximize strength and function.   101/82 sitting 124/59 after standing HR 92 SpO2 93% on Speed   Follow Up Recommendations  SNF;Supervision/Assistance - 24 hour     Equipment Recommendations       Recommendations for Other Services       Precautions / Restrictions Precautions Precautions: Fall;Sternal Precaution Comments: CRRT     Mobility  Bed Mobility               General bed mobility comments: pt in chair on arrival via nursing with maxisky  Transfers Overall transfer level: Needs assistance   Transfers: Sit to/from Stand Sit to Stand: Mod assist;+2 physical assistance         General transfer comment: mod assist for anterior translation and rise from surface with increased time and assist. Pt stood x 2 with RW in front for 22min and 30 sec respectively  Ambulation/Gait             General Gait Details: unable due to CRRT   Stairs            Wheelchair Mobility    Modified Rankin (Stroke Patients Only)       Balance Overall balance assessment: Needs assistance   Sitting balance-Leahy Scale: Poor       Standing balance-Leahy Scale: Poor                              Cognition Arousal/Alertness: Awake/alert Behavior During Therapy: Flat affect Overall Cognitive Status: Impaired/Different from baseline Area of Impairment: Orientation;Attention;Memory;Following commands                 Orientation Level: Disoriented to;Time;Situation Current Attention Level: Focused Memory: Decreased recall of precautions;Decreased short-term memory Following Commands: Follows one step commands inconsistently;Follows one step commands with increased time Safety/Judgement: Decreased awareness of safety;Decreased awareness of deficits   Problem Solving: Slow processing;Decreased initiation;Difficulty sequencing;Requires verbal cues;Requires tactile cues General Comments: Delayed follow of commands, pt unable to recall precautions even immediately after education      Exercises General Exercises - Lower Extremity Long Arc Quad: AAROM;Both;Seated;10 reps Hip Flexion/Marching: AAROM;Both;Seated;10 reps    General Comments        Pertinent Vitals/Pain Pain Assessment: No/denies pain    Home Living                      Prior Function            PT Goals (current goals can now be found in the care plan  section) Acute Rehab PT Goals Time For Goal Achievement: 11/04/16 Potential to Achieve Goals: Fair Progress towards PT goals: Goals downgraded-see care plan    Frequency    Min 3X/week      PT Plan Current plan remains appropriate    Co-evaluation              AM-PAC PT "6 Clicks" Daily Activity  Outcome Measure  Difficulty turning over in bed (including adjusting bedclothes, sheets and blankets)?: Unable Difficulty moving from lying on back to sitting on the side of the bed?  : Unable Difficulty sitting down on and standing up from a chair with arms (e.g., wheelchair, bedside commode, etc,.)?: Unable Help needed moving to and from a bed to chair (including a wheelchair)?: Total Help needed walking in hospital room?: Total Help needed climbing 3-5 steps with a railing? : Total 6 Click Score: 6    End of Session Equipment Utilized During Treatment: Oxygen Activity Tolerance: Patient tolerated treatment well Patient left: in chair;with call bell/phone within reach;with nursing/sitter in room Nurse Communication: Mobility status;Precautions PT Visit Diagnosis: Muscle weakness (generalized) (M62.81);Other abnormalities of gait and mobility (R26.89)     Time: 1040-1106 PT Time Calculation (min) (ACUTE ONLY): 26 min  Charges:  $Therapeutic Exercise: 8-22 mins $Therapeutic Activity: 8-22 mins                    G Codes:       Elwyn Reach, PT [phone]   Marialena Wollen B Zakariye Nee 10/21/2016, 1:42 PM

## 2016-10-21 NOTE — Progress Notes (Signed)
Patient ID: [name], male   DOB: 1930-08-18, 83 y.o.   MRN: [id number] TCTS Evening Rounds:  He has been hemodynamically stable   Remains on CRRT with negative I/O  BMET    Component Value Date/Time   NA 138 10/21/2016 1648   K 3.4 (L) 10/21/2016 1648   CL 100 (L) 10/21/2016 1648   CO2 26 10/21/2016 1648   GLUCOSE 160 (H) 10/21/2016 1648   BUN 32 (H) 10/21/2016 1648   CREATININE 1.70 (H) 10/21/2016 1648   CALCIUM 8.0 (L) 10/21/2016 1648   GFRNONAA 35 (L) 10/21/2016 1648   GFRAA 41 (L) 10/21/2016 1648   Awake and alert  Nurse reports watery stools that smell so will send C. Diff.

## 2016-10-21 NOTE — Progress Notes (Signed)
Paged nephrology due to an increase in heparin infusion syringe rate from 450 to 750 since 8 pm on 10/8. Patient vitals stable with no other concerns. Dr. Joelyn Oms ordered to hold heparin infusion syringe rate at 750 unit and continue ACT hourly. Reported all pressures of CRRT machine, reminded MD of patient post op day 14 from CABG, MD stated no need to change filter. Continue to monitor closely.

## 2016-10-21 NOTE — Significant Event (Signed)
Spoke with Dr. Justin Mend about ACT, about last one being 186. Per Dr. Justin Mend, not to increase heparin threshold-to leave at current rate. Also, MD gave verbal order to hold sodium phosphate today. Dr. Justin Mend gave verbal order to allow staff to pull -150 if patient can tolerate.    Aerielle Stoklosa

## 2016-10-22 ENCOUNTER — Inpatient Hospital Stay (HOSPITAL_COMMUNITY): Payer: Medicare Other

## 2016-10-22 LAB — POCT ACTIVATED CLOTTING TIME
ACTIVATED CLOTTING TIME: 175 s
ACTIVATED CLOTTING TIME: 180 s
ACTIVATED CLOTTING TIME: 153 s
Activated Clotting Time: 175 seconds
Activated Clotting Time: 164 seconds
Activated Clotting Time: 169 seconds
Activated Clotting Time: 169 seconds
Activated Clotting Time: 186 seconds

## 2016-10-22 LAB — CBC
HCT: 26.2 % — ABNORMAL LOW (ref 39.0–52.0)
Hemoglobin: 8.4 g/dL — ABNORMAL LOW (ref 13.0–17.0)
MCH: 29.1 pg (ref 26.0–34.0)
MCHC: 32.1 g/dL (ref 30.0–36.0)
MCV: 90.7 fL (ref 78.0–100.0)
PLATELETS: 156 10*3/uL (ref 150–400)
RBC: 2.89 MIL/uL — AB (ref 4.22–5.81)
RDW: 16.7 % — ABNORMAL HIGH (ref 11.5–15.5)
WBC: 15.5 10*3/uL — ABNORMAL HIGH (ref 4.0–10.5)

## 2016-10-22 LAB — COMPREHENSIVE METABOLIC PANEL
ALK PHOS: 270 U/L — AB (ref 38–126)
ALT: 194 U/L — ABNORMAL HIGH (ref 17–63)
AST: 76 U/L — ABNORMAL HIGH (ref 15–41)
Albumin: 1.9 g/dL — ABNORMAL LOW (ref 3.5–5.0)
Anion gap: 10 (ref 5–15)
BUN: 38 mg/dL — ABNORMAL HIGH (ref 6–20)
CALCIUM: 7.8 mg/dL — AB (ref 8.9–10.3)
CHLORIDE: 102 mmol/L (ref 101–111)
CO2: 27 mmol/L (ref 22–32)
CREATININE: 1.76 mg/dL — AB (ref 0.61–1.24)
GFR calc Af Amer: 39 mL/min — ABNORMAL LOW (ref >60)
GFR calc non Af Amer: 34 mL/min — ABNORMAL LOW (ref >60)
GLUCOSE: 165 mg/dL — AB (ref 65–99)
Potassium: 3.3 mmol/L — ABNORMAL LOW (ref 3.5–5.1)
SODIUM: 139 mmol/L (ref 135–145)
Total Bilirubin: 0.9 mg/dL (ref 0.3–1.2)
Total Protein: 4.9 g/dL — ABNORMAL LOW (ref 6.5–8.1)

## 2016-10-22 LAB — POCT I-STAT, CHEM 8
BUN: 34 mg/dL — AB (ref 6–20)
CREATININE: 1.6 mg/dL — AB (ref 0.61–1.24)
Calcium, Ion: 1.05 mmol/L — ABNORMAL LOW (ref 1.15–1.40)
Chloride: 101 mmol/L (ref 101–111)
Glucose, Bld: 160 mg/dL — ABNORMAL HIGH (ref 65–99)
HEMATOCRIT: 27 % — AB (ref 39.0–52.0)
Hemoglobin: 9.2 g/dL — ABNORMAL LOW (ref 13.0–17.0)
POTASSIUM: 3.7 mmol/L (ref 3.5–5.1)
Sodium: 139 mmol/L (ref 135–145)
TCO2: 27 mmol/L (ref 22–32)

## 2016-10-22 LAB — RENAL FUNCTION PANEL
Albumin: 2 g/dL — ABNORMAL LOW (ref 3.5–5.0)
Anion gap: 8 (ref 5–15)
BUN: 37 mg/dL — AB (ref 6–20)
CO2: 26 mmol/L (ref 22–32)
Calcium: 7.7 mg/dL — ABNORMAL LOW (ref 8.9–10.3)
Chloride: 104 mmol/L (ref 101–111)
Creatinine, Ser: 1.76 mg/dL — ABNORMAL HIGH (ref 0.61–1.24)
GFR calc Af Amer: 39 mL/min — ABNORMAL LOW (ref >60)
GFR calc non Af Amer: 34 mL/min — ABNORMAL LOW (ref >60)
GLUCOSE: 158 mg/dL — AB (ref 65–99)
PHOSPHORUS: 1.1 mg/dL — AB (ref 2.5–4.6)
POTASSIUM: 3.5 mmol/L (ref 3.5–5.1)
Sodium: 138 mmol/L (ref 135–145)

## 2016-10-22 LAB — GLUCOSE, CAPILLARY
GLUCOSE-CAPILLARY: 185 mg/dL — AB (ref 65–99)
GLUCOSE-CAPILLARY: 165 mg/dL — AB (ref 65–99)
GLUCOSE-CAPILLARY: 163 mg/dL — AB (ref 65–99)
GLUCOSE-CAPILLARY: 260 mg/dL — AB (ref 65–99)
Glucose-Capillary: 165 mg/dL — ABNORMAL HIGH (ref 65–99)

## 2016-10-22 LAB — PROTIME-INR
INR: 1.77
PROTHROMBIN TIME: 20.5 s — AB (ref 11.4–15.2)

## 2016-10-22 LAB — MAGNESIUM: Magnesium: 2.2 mg/dL (ref 1.7–2.4)

## 2016-10-22 LAB — APTT: APTT: 57 s — AB (ref 24–36)

## 2016-10-22 LAB — PHOSPHORUS: Phosphorus: 2 mg/dL — ABNORMAL LOW (ref 2.5–4.6)

## 2016-10-22 MED ORDER — AMIODARONE IV BOLUS ONLY 150 MG/100ML
150.0000 mg | Freq: Once | INTRAVENOUS | Status: AC
  Administered 2016-10-22: 150 mg via INTRAVENOUS
  Filled 2016-10-22: qty 100

## 2016-10-22 MED ORDER — POTASSIUM CHLORIDE 10 MEQ/50ML IV SOLN
10.0000 meq | INTRAVENOUS | Status: AC
  Administered 2016-10-22 (×3): 10 meq via INTRAVENOUS
  Filled 2016-10-22 (×3): qty 50

## 2016-10-22 MED ORDER — WARFARIN SODIUM 2 MG PO TABS
2.0000 mg | ORAL_TABLET | Freq: Every day | ORAL | Status: DC
  Administered 2016-10-22 – 2016-10-26 (×5): 2 mg
  Filled 2016-10-22 (×5): qty 1

## 2016-10-22 NOTE — Progress Notes (Signed)
[facility] ROUNDING NOTE   Subjective:   Interval History: 83 y/o male underwent a CABG and MAZE on 9/27. Developed worsening renal failure post operatively requiring dialysis, needed intubation for catheter placement. On 10/3 he developed shock in the setting of a spontaneous R sided pneumothorax. Acute on chronic renal disease stage 4. Baseline creatinine 2.6 Followed by Dr Marval Regal Now on CRRT for volume overload. Patient with persistent hypotension 10/3     patient appears to be slowly making progress and pressors have been stopped   Objective:  Vital signs in last 24 hours:  Temp:  [97.4 F (36.3 C)-98.6 F (37 C)] 97.7 F (36.5 C) (10/10 0800) Pulse Rate:  [26-126] 119 (10/10 1000) Resp:  [18-37] 30 (10/10 1000) BP: (89-122)/(50-75) 89/60 (10/10 1000) SpO2:  [93 %-100 %] 97 % (10/10 1000) Weight:  [216 lb 0.8 oz (98 kg)] 216 lb 0.8 oz (98 kg) (10/10 0600)  Weight change: -10 lb 5.8 oz (-4.7 kg) Filed Weights   10/20/16 0500 10/21/16 0400 10/22/16 0600  Weight: 221 lb 9 oz (100.5 kg) 226 lb 6.6 oz (102.7 kg) 216 lb 0.8 oz (98 kg)    Intake/Output: I/O last 3 completed shifts: In: 8756 [P.O.:30; I.V.:87; NG/GT:1300] Out: 5525 [Other:4545; Stool:950; Chest Tube:30]   Intake/Output this shift:  Total I/O In: 140 [I.V.:20; NG/GT:120] Out: 407 [Other:382; Stool:25]  CVS- RRR RS- CTA ABD- BS present soft non-distended EXT-  1-2 + lower extremity edema   Basic Metabolic Panel:  Recent Labs Lab 10/18/16 0400  10/19/16 0426  10/20/16 0328 10/20/16 1551 10/20/16 1557 10/21/16 0303 10/21/16 1635 10/21/16 1648 10/22/16 0315  NA 136  < > 136  < > 136 135 137 135 139 138 139  K 3.6  < > 3.4*  < > 3.8 3.8 3.8 5.9* 3.6 3.4* 3.3*  CL 95*  < > 97*  < > 100* 98* 100* 101 101 100* 102  CO2 28  < > 26  < > 25 24  --  26  --  26 27  GLUCOSE 102*  < > 100*  < > 180* 174* 177* 130* 159* 160* 165*  BUN 24*  < > 23*  < > 24* 25* 25* 31* 32* 32* 38*   CREATININE 1.87*  < > 1.77*  < > 1.70* 1.68* 1.60* 1.69* 1.40* 1.70* 1.76*  CALCIUM 7.2*  < > 7.9*  < > 8.1* 8.2*  --  8.0*  --  8.0* 7.8*  MG 2.2  --  2.2  --  2.3  --   --  2.5*  --   --  2.2  PHOS  --   < >  --   < > 2.5 3.3  --  2.7  --  2.1* 2.0*  < > = values in this interval not displayed.  Liver Function Tests:  Recent Labs Lab 10/18/16 0400  10/19/16 0426  10/20/16 0328 10/20/16 1551 10/21/16 0303 10/21/16 1648 10/22/16 0315  AST 608*  --  406*  --  317*  --  201*  --  76*  ALT 450*  --  380*  --  375*  --  312*  --  194*  ALKPHOS 309*  --  328*  --  366*  --  326*  --  270*  BILITOT 1.3*  --  1.4*  --  1.0  --  2.2*  --  0.9  PROT 5.3*  --  5.1*  --  5.2*  --  5.3*  --  4.9*  ALBUMIN 2.0*  < > 1.9*  < > 1.9* 2.0* 2.1* 1.9* 1.9*  < > = values in this interval not displayed.  Recent Labs Lab 10/16/16 1515  LIPASE 23  AMYLASE 25*   No results for input(s): AMMONIA in the last 168 hours.  CBC:  Recent Labs Lab 10/18/16 0400  10/19/16 0426  10/20/16 0328 10/20/16 1557 10/21/16 0303 10/21/16 1635 10/22/16 0315  WBC 19.5*  --  20.3*  --  21.5*  --  18.9*  --  15.5*  NEUTROABS  --   --  17.4*  --  18.5*  --  16.1*  --   --   HGB 9.3*  < > 8.8*  < > 9.2* 9.9* 9.1* 9.5* 8.4*  HCT 27.3*  < > 26.4*  < > 27.9* 29.0* 28.3* 28.0* 26.2*  MCV 85.0  --  86.0  --  88.6  --  89.8  --  90.7  PLT 222  --  184  --  200  --  185  --  156  < > = values in this interval not displayed.  Cardiac Enzymes: No results for input(s): CKTOTAL, CKMB, CKMBINDEX, TROPONINI in the last 168 hours.  BNP: Invalid input(s): POCBNP  CBG:  Recent Labs Lab 10/21/16 0840 10/21/16 1134 10/21/16 2009 10/22/16 0411 10/22/16 0841  GLUCAP 147* 184* 225* 165* 163*    Microbiology: Results for orders placed or performed during the hospital encounter of 10/09/16  Culture, respiratory (NON-Expectorated)     Status: None   Collection Time: 10/16/16  8:43 AM  Result Value Ref Range  Status   Specimen Description TRACHEAL ASPIRATE  Final   Special Requests NONE  Final   Gram Stain   Final    ABUNDANT WBC PRESENT, PREDOMINANTLY PMN NO ORGANISMS SEEN    Culture RARE ENTEROBACTER CLOACAE  Final   Report Status 10/19/2016 FINAL  Final   Organism ID, Bacteria ENTEROBACTER CLOACAE  Final      Susceptibility   Enterobacter cloacae - MIC*    CEFAZOLIN >=64 RESISTANT Resistant     CEFEPIME <=1 SENSITIVE Sensitive     CEFTAZIDIME <=1 SENSITIVE Sensitive     CEFTRIAXONE <=1 SENSITIVE Sensitive     CIPROFLOXACIN <=0.25 SENSITIVE Sensitive     GENTAMICIN <=1 SENSITIVE Sensitive     IMIPENEM 0.5 SENSITIVE Sensitive     TRIMETH/SULFA <=20 SENSITIVE Sensitive     PIP/TAZO <=4 SENSITIVE Sensitive     * RARE ENTEROBACTER CLOACAE  C difficile quick scan w PCR reflex     Status: None   Collection Time: 10/21/16  5:54 PM  Result Value Ref Range Status   C Diff antigen NEGATIVE NEGATIVE Final   C Diff toxin NEGATIVE NEGATIVE Final   C Diff interpretation No C. difficile detected.  Final    Coagulation Studies:  Recent Labs  10/20/16 0328 10/21/16 0303 10/22/16 0315  LABPROT 26.1* 20.4* 20.5*  INR 2.42 1.77 1.77    Urinalysis: No results for input(s): COLORURINE, LABSPEC, PHURINE, GLUCOSEU, HGBUR, BILIRUBINUR, KETONESUR, PROTEINUR, UROBILINOGEN, NITRITE, LEUKOCYTESUR in the last 72 hours.  Invalid input(s): APPERANCEUR    Imaging: Dg Chest Port 1 View  Result Date: 10/22/2016 CLINICAL DATA:  Congestive heart failure. EXAM: PORTABLE CHEST 1 VIEW COMPARISON:  10/21/2016. FINDINGS: Enlarged cardiomediastinal silhouette is stable. Central venous catheters, and enteric tube remain unchanged. There is slight clearing of pulmonary edema compared with yesterday's radiograph. Pigtail catheter overlying the RIGHT chest on previous radiographs is no longer visualized  and has presumably been removed. There is no pneumothorax. IMPRESSION: RIGHT pigtail catheter no longer seen. No  pneumothorax. Slight improvement in pulmonary edema. Electronically Signed   By: Staci Righter M.D.   On: 10/22/2016 08:16   Dg Chest Port 1 View  Result Date: 10/21/2016 CLINICAL DATA:  CHEST TUBE PRESENT ,RESP FAILURE EXAM: PORTABLE CHEST 1 VIEW COMPARISON:  One day prior FINDINGS: Endotracheal tube no longer identified and has presumably been removed. Right internal jugular line terminates at the high SVC. Feeding tube is difficult to visualized inferiorly but likely extends beyond the inferior aspect of the film. Reverse apical lordotic positioning. Right-sided pigtail catheter remains. Left-sided central line from a subclavian approach with tip at mid SVC. Prior median sternotomy. Left atrial appendage occlusion device. Cardiomegaly accentuated by AP portable technique. No definite pleural fluid. No pneumothorax. Worsening relatively diffuse interstitial and airspace disease, with relative sparing of the apices. Chin overlies the apices minimally. IMPRESSION: Worsened aeration, with progressive interstitial and airspace disease. Suspicious for pulmonary edema. Concurrent infection cannot be excluded. Presumed extubation. Right-sided chest tube in place; no pneumothorax. Electronically Signed   By: Abigail Miyamoto M.D.   On: 10/21/2016 07:16   Dg Abd Portable 1v  Result Date: 10/20/2016 CLINICAL DATA:  NG tube placement. EXAM: PORTABLE ABDOMEN - 1 VIEW COMPARISON:  Abdominal radiographs same date. FINDINGS: Feeding tube tip is in the body region of the stomach. A right-sided Cope loop catheter is stable. The left subclavian double-lumen catheter is stable. Vascular crowding, atelectasis and probable edema. Right IJ catheter tip is in the proximal SVC. IMPRESSION: Vascular crowding, interstitial edema and atelectasis. Feeding tube tip is in the body region of the stomach. Other support apparatus as above. Electronically Signed   By: Marijo Sanes M.D.   On: 10/20/2016 14:09     Medications:   . sodium  chloride    . sodium chloride    . amiodarone    . cefTRIAXone (ROCEPHIN)  IV Stopped (10/21/16 1830)  . feeding supplement (VITAL HIGH PROTEIN) 1,000 mL (10/22/16 0700)  . ferumoxytol Stopped (10/15/16 1407)  . heparin 10,000 units/ 20 mL infusion syringe 750 Units/hr (10/22/16 0655)  . heparin 999 mL/hr at 10/21/16 1705  . dialysis replacement fluid (prismasate) 300 mL/hr at 10/22/16 0910  . dialysis replacement fluid (prismasate) 200 mL/hr at 10/21/16 2212  . dialysate (PRISMASATE) 1,000 mL/hr at 10/22/16 0357   . amiodarone  400 mg Per Tube Q12H  . aspirin  81 mg Per Tube Daily  . chlorhexidine gluconate (MEDLINE KIT)  15 mL Mouth Rinse BID  . Chlorhexidine Gluconate Cloth  6 each Topical Q0600  . feeding supplement (PRO-STAT SUGAR FREE 64)  60 mL Per Tube TID  . folic acid  1 mg Per Tube Daily  . guaiFENesin  1,200 mg Oral BID  . insulin aspart  0-24 Units Subcutaneous Q4H  . levothyroxine  25 mcg Per Tube QAC breakfast  . mouth rinse  15 mL Mouth Rinse q12n4p  . midazolam  2 mg Intravenous Once  . moving right along book   Does not apply Once  . multivitamin  15 mL Per Tube Daily  . pantoprazole sodium  40 mg Per Tube Daily  . patient's guide to using coumadin book   Does not apply Once  . sodium chloride flush  10-40 mL Intracatheter Q12H  . sodium chloride flush  3 mL Intravenous Q12H  . sodium chloride flush  3 mL Intravenous Q12H  . warfarin  2 mg Per Tube q1800  . Warfarin - Physician Dosing Inpatient   Does not apply q1800   sodium chloride, fentaNYL (SUBLIMAZE) injection, fentaNYL (SUBLIMAZE) injection, heparin, heparin, heparin, midazolam, ondansetron (ZOFRAN) IV, sodium chloride flush, sodium chloride flush, sodium chloride flush  Assessment/ Plan:  1. AKI on CKD3/4- post CABG and MAZE procedure. Steady rise in creatinine, no response to ceiling dose diuretics. Hemodynamically driven (low blood pressures) AKI in kidneys with little reserve.  1. Left IJ vas cath  by CCM (10/3) 2. CRRT initiated 10/15/16 tolerating fine.  3. Heparin with CRRT  Recent increase to ceiling dose no issues though with clotting or bleeding  4.  Phosphorus repleted 5. Replete potassium UF goal tolerating fluid removal 100- 150cc/hour   DID SUGGEST DECREASE GOAL ALTHOUGH STILL FLUID OVERLOADED  2. S/P CABG/MAZE procedure - ECHO 10/2; no pericardial effusion. Anticoagulate with coumadin  3. Acute hypoxic resp failure. Pulm edema. Poss HCAP. Oxygenation improving and patient extubated successfully 10/8 4. Shock - Improved and off pressors  1. Sig hypotension 10/4 associated with spontaneous PTX.  2. CCM worried about brewing sepsis. (ATB's (v/z) for possible HCAP) rocephin stopped and white count noted patient is afebrile 3. Continues to wean pressors  5. Anemia- ABLA. No prior significant CKD related anemia (had no outpt ESA requirement) 1. Transfuse for Hb <7 (transfused 9/27) 2. Dosed Aranesp 150 10/2, then will get weekly.  3. Feraheme dosed 10/3, redose 10/10. (tsat 6%) 6. Diabetes with neuropathy 1. Stopped gabapentin (early side effects from accumulation) 7. Hyponatremia - fluid overload. Resolved with CRRT 8. Metabolic acidosis - resolved.  9. Hypokalemia  Replete today    Looks to be doing well although not really that hopeful that he will avoid dialysis after CRRT due to poor renal function at baseline although will continue to assess for recovery     Will continue CRRT today    LOS: 13 Jameika Kinn W @TODAY @10 :19 AM

## 2016-10-22 NOTE — Progress Notes (Signed)
PULMONARY / CRITICAL CARE MEDICINE   Name: [name] MRN: [id number] DOB: 09/24/1930    ADMISSION DATE:  10/09/2016 CONSULTATION DATE: 10/15/2016  REFERRING MD:  Roxy Manns  CHIEF COMPLAINT:  Hypoxemic respiratory failrue  HISTORY OF PRESENT ILLNESS:   83 y/o male underwent a CABG and MAZE on 9/27.  Developed worsening renal failure post operatively requiring dialysis, needed intubation for catheter placement.   On 10/3 he developed shock in the setting of a spontaneous R sided pneumothorax.  PNx resolved and ct out 10/9 and pccm signed off 10/9   SUBJECTIVE:  Chest tube out, negative I/O. PCCM will sign off.  VITAL SIGNS: BP (!) 89/52   Pulse (!) 118   Temp 97.7 F (36.5 C) (Oral)   Resp 20   Ht 5\' 8"  (1.727 m)   Wt 216 lb 0.8 oz (98 kg)   SpO2 96%   BMI 32.85 kg/m   HEMODYNAMICS: CVP:  [5 mmHg-13 mmHg] 13 mmHg  VENTILATOR SETTINGS:    INTAKE / OUTPUT: I/O last 3 completed shifts: In: 5643 [P.O.:30; I.V.:87; NG/GT:1300] Out: 5525 [PIRJJ:8841; Stool:950; Chest Tube:30]  Intake/Output Summary (Last 24 hours) at 10/22/16 0934 Last data filed at 10/22/16 0900  Gross per 24 hour  Intake              820 ml  Output             3678 ml  Net            -2858 ml    PHYSICAL EXAMINATION:  General:  Frail elderly male in no distress HEENT: MM pink/moist, no jvd/lan, feeding tube in nare PSY:nl affect Neuro: Follows commands CV: had PULM: even/non-labored, lungs bilaterally diminished  YS:AYTK, non-tender, bsx4 active  Extremities: warm/dry, +++edema  Skin: no rashes or lesions   LABS:  BMET  Recent Labs Lab 10/21/16 0303 10/21/16 1635 10/21/16 1648 10/22/16 0315  NA 135 139 138 139  K 5.9* 3.6 3.4* 3.3*  CL 101 101 100* 102  CO2 26  --  26 27  BUN 31* 32* 32* 38*  CREATININE 1.69* 1.40* 1.70* 1.76*  GLUCOSE 130* 159* 160* 165*   Electrolytes  Recent Labs Lab 10/20/16 0328  10/21/16 0303 10/21/16 1648 10/22/16 0315  CALCIUM 8.1*  < > 8.0* 8.0*  7.8*  MG 2.3  --  2.5*  --  2.2  PHOS 2.5  < > 2.7 2.1* 2.0*  < > = values in this interval not displayed.  CBC  Recent Labs Lab 10/20/16 0328  10/21/16 0303 10/21/16 1635 10/22/16 0315  WBC 21.5*  --  18.9*  --  15.5*  HGB 9.2*  < > 9.1* 9.5* 8.4*  HCT 27.9*  < > 28.3* 28.0* 26.2*  PLT 200  --  185  --  156  < > = values in this interval not displayed.  Coag's  Recent Labs Lab 10/20/16 0328 10/21/16 0303 10/22/16 0315  APTT 60* 54* 57*  INR 2.42 1.77 1.77   Sepsis Markers  Recent Labs Lab 10/16/16 1515 10/17/16 1117 10/17/16 1501  LATICACIDVEN 4.6* 2.2* 2.1*   ABG  Recent Labs Lab 10/18/16 0409 10/19/16 0427 10/20/16 0337  PHART 7.489* 7.526* 7.417  PCO2ART 39.8 35.5 42.7  PO2ART 93.2 93.0 76.0*   Liver Enzymes  Recent Labs Lab 10/20/16 0328  10/21/16 0303 10/21/16 1648 10/22/16 0315  AST 317*  --  201*  --  76*  ALT 375*  --  312*  --  194*  ALKPHOS 366*  --  326*  --  270*  BILITOT 1.0  --  2.2*  --  0.9  ALBUMIN 1.9*  < > 2.1* 1.9* 1.9*  < > = values in this interval not displayed.  Cardiac Enzymes No results for input(s): TROPONINI, PROBNP in the last 168 hours.  Glucose  Recent Labs Lab 10/20/16 2311 10/21/16 0312 10/21/16 0840 10/21/16 1134 10/21/16 2009 10/22/16 0411  GLUCAP 161* 131* 147* 184* 225* 165*   Imaging Dg Chest Port 1 View  Result Date: 10/22/2016 CLINICAL DATA:  Congestive heart failure. EXAM: PORTABLE CHEST 1 VIEW COMPARISON:  10/21/2016. FINDINGS: Enlarged cardiomediastinal silhouette is stable. Central venous catheters, and enteric tube remain unchanged. There is slight clearing of pulmonary edema compared with yesterday's radiograph. Pigtail catheter overlying the RIGHT chest on previous radiographs is no longer visualized and has presumably been removed. There is no pneumothorax. IMPRESSION: RIGHT pigtail catheter no longer seen. No pneumothorax. Slight improvement in pulmonary edema. Electronically Signed    By: Staci Righter M.D.   On: 10/22/2016 08:16   STUDIES:  10/9 cxr with poor aeration  CULTURES: 10/4 resp culture > abundant WBC , enterobacter ss zoysn/roc  ANTIBIOTICS: 10/8 roc>>  SIGNIFICANT EVENTS: 10/3 hypoxemic respiratory failure requiring intubation 104 ptx chest tube placed , removed 10/9  LINES/TUBES: ETT 10/3>>>10/8 L [place] Trialysis catheter 10/3>>> L radial a-line 10/3>>> R IJ single lumen catheter 10/3>>> 10/4 R subclavian CVL 10/4 > 10/5 R pigtail chest drain 10/4 > 10/9 R IJ CVL 10/5 >   DISCUSSION: 83 y/o male s/p CABG, MAZE procedure here with persistent acute respiratory failure with hypoxemia in the setting of renal failure requiring HD and persistent bilateral pulmonary infiltrates.  Extubated 10/8, maintaining negative I/o, weaning dopa.  ASSESSMENT / PLAN:  PULMONARY A: Acute respiratory failure with hypoxemia, resolved extubated 10/8 Bilateral pulmonary infiltrates HCAP enterobacter ss zoysn/roc Spontaneous pneumothorax on R 10/4, resolved 10/9/ ct out 10/9 no pnx 10/10 Acute pulmonary edema ARDS  P:   Extubated 10/8 Pulmonary toilet DC chest tube 10/9 Continue negative I/o per renal CRRT  CARDIOVASCULAR A:  CAD, s/p CABG Afib, s/p MAZE Shock 10/3 in setting of pneumothorax P:  Tele Continue volume removal Dopamine weaning for BP support CT removed per cvts 10/9 and no pnx 10/10  RENAL Lab Results  Component Value Date   CREATININE 1.76 (H) 10/22/2016   CREATININE 1.70 (H) 10/21/2016   CREATININE 1.40 (H) 10/21/2016    Recent Labs Lab 10/21/16 1635 10/21/16 1648 10/22/16 0315  K 3.6 3.4* 3.3*     Recent Labs Lab 10/21/16 1635 10/21/16 1648 10/22/16 0315  NA 139 138 139    Intake/Output Summary (Last 24 hours) at 10/22/16 0737 Last data filed at 10/22/16 0900  Gross per 24 hour  Intake              940 ml  Output             3678 ml  Net            -2738 ml     A:   Hyponatermia AKI Anasarca P:    Continue volume removal with CVVHD per renal, goal neg I/o BMET in AM F/U UOP Replace electrolytes as indicated  GASTROINTESTINAL A:   Ileus Shock liver > improving P:   Repeat LFT's are improving Swallow eval ordered  HEMATOLOGIC Lab Results  Component Value Date   INR 1.77 10/22/2016   INR 1.77 10/21/2016   INR 2.42  10/20/2016    A:   Coagulopathy in setting of shock liver, anticoagulant meds P:  Monitor for bleeding Restart coumadin 10/8 per CVTS  INFECTIOUS A:   HCAP with enterobacter SS zoysn P:   DC vanc/zosyn Roc per cvts Monitor for fever  ENDOCRINE CBG (last 3)   Recent Labs  10/21/16 1134 10/21/16 2009 10/22/16 0411  GLUCAP 184* 225* 165*     A:   DM2 P:   Monitor glucose, SSI  NEUROLOGIC A:   No acute issues P:   RASS -0 Holding sedation post extubation  FAMILY  - Updates: No family bedside to update - Inter-disciplinary family meet or Palliative Care meeting due by:  day 7  App CCT 30 min  10/10 PCCM will sign off, please call if needed.  Richardson Landry Minor ACNP Maryanna Shape PCCM Pager [phone] till 3 pm If no answer page [phone] 10/22/2016, 9:22 AM  Attending Note:  83 year old male with extensive cardiac history who underwent cardiac surgery and developed shock and renal failure.  Patient is negative 2.5 liters overnight.  Extubated.  Continues to have crackles on the bases.  I reviewed CXR myself, pulmonary edema noted.  Will continue CRRT for now.  Continue pressor support as needed.  SLP.  Hold in the ICU.   The patient is critically ill with multiple organ systems failure and requires high complexity decision making for assessment and support, frequent evaluation and titration of therapies, application of advanced monitoring technologies and extensive interpretation of multiple databases.   Critical Care Time devoted to patient care services described in this note is  35  Minutes. This time reflects time of care of this signee  Dr Jennet Maduro. This critical care time does not reflect procedure time, or teaching time or supervisory time of PA/NP/Med student/Med Resident etc but could involve care discussion time.  Rush Farmer, M.D. [facility] Pulmonary/Critical Care Medicine. Pager: [phone]. After hours pager: [phone].

## 2016-10-22 NOTE — Progress Notes (Signed)
[address]       [place] [address]             [phone]        CARDIOTHORACIC SURGERY PROGRESS NOTE   R13 Days Post-Op Procedure(s) (LRB): CORONARY ARTERY BYPASS GRAFTING (CABG) x two, using left internal mammary artery and right leg greater saphenous vein harvested endoscopically (N/A) MAZE (N/A) TRANSESOPHAGEAL ECHOCARDIOGRAM (TEE) (N/A) CLIPPING OF ATRIAL APPENDAGE  Subjective: Alert and awake in bed this morning. Asking "to be closer to his family." No complaints of CP, SOB, or abdominal pain.   Objective: Vital signs: BP Readings from Last 1 Encounters:  10/22/16 108/66   Pulse Readings from Last 1 Encounters:  10/22/16 (!) 123   Resp Readings from Last 1 Encounters:  10/22/16 19   Temp Readings from Last 1 Encounters:  10/22/16 98.6 F (37 C) (Oral)    Hemodynamics: CVP:  [5 mmHg-13 mmHg] 13 mmHg  Physical Exam:  Rhythm:   Atrial fibrillation at 120, Occasional PVCs  Breath sounds: Diminished bilaterally  Heart sounds:  Irregularly Irregular, Tachycardic, no m/r/g  Incisions:  Median sternotomy incision is clean, dry, and intact without erythema or edema  Abdomen:  Soft, non-tender, non-distended, +BS  Extremities:  2+ pitting edema bilaterally, 2+ PT/DP pulses.    Intake/Output from previous day: 10/09 0701 - 10/10 0700 In: 970.2 [P.O.:30; I.V.:10.2; NG/GT:930] Out: 0350 [Stool:900; Chest Tube:30] Intake/Output this shift: No intake/output data recorded.  Lab Results:  CBC: Recent Labs  10/21/16 0303 10/21/16 1635 10/22/16 0315  WBC 18.9*  --  15.5*  HGB 9.1* 9.5* 8.4*  HCT 28.3* 28.0* 26.2*  PLT 185  --  156    BMET:  Recent Labs  10/21/16 1648 10/22/16 0315  NA 138 139  K 3.4* 3.3*  CL 100* 102  CO2 26 27  GLUCOSE 160* 165*  BUN 32* 38*  CREATININE 1.70* 1.76*  CALCIUM 8.0* 7.8*     PT/INR:   Recent Labs  10/22/16 0315  LABPROT 20.5*  INR 1.77    CBG (last 3)   Recent Labs   10/21/16 1134 10/21/16 2009 10/22/16 0411  GLUCAP 184* 225* 165*    ABG    Component Value Date/Time   PHART 7.417 10/20/2016 0337   PCO2ART 42.7 10/20/2016 0337   PO2ART 76.0 (L) 10/20/2016 0337   HCO3 27.5 10/20/2016 0337   TCO2 27 10/21/2016 1635   ACIDBASEDEF 1.0 10/17/2016 0007   O2SAT 71.5 10/20/2016 0345    CXR: PORTABLE CHEST 1 VIEW  COMPARISON:  10/21/2016.  FINDINGS: Enlarged cardiomediastinal silhouette is stable. Central venous catheters, and enteric tube remain unchanged. There is slight clearing of pulmonary edema compared with yesterday's radiograph. Pigtail catheter overlying the RIGHT chest on previous radiographs is no longer visualized and has presumably been removed. There is no pneumothorax.  IMPRESSION: RIGHT pigtail catheter no longer seen. No pneumothorax. Slight improvement in pulmonary edema.  Electronically Signed   By: Staci Righter M.D.   On: 10/22/2016 08:16   Assessment/Plan: S/P Procedure(s) (LRB): CORONARY ARTERY BYPASS GRAFTING (CABG) x two, using left internal mammary artery and right leg greater saphenous vein harvested endoscopically (N/A) MAZE (N/A) TRANSESOPHAGEAL ECHOCARDIOGRAM (TEE) (N/A) CLIPPING OF ATRIAL APPENDAGE  Cardiovascular - Currently in atrial fibrillation at 110-120bpmon 400 mg Amiodarone BID PO, suspect this will need to be adjusted today. BP 108/66No longer requiring drips.   INR - INR improved to 1.77 this morning, warfarin restarted at 2mg  yesterday. Will  continue to monitor INR.   Pulmonary - SpO2 97% on 2L [place]%. CXR this morning shows slight improvement in pulmonary edema.  Encouraged IS  Pneumothorax- Resolved.   Elevated WBC- WBC 15.5this morning. Afebrile. Continue Rocephin  Anemia- Expected post-operative. H&H 8.4and 26.2. Continue ferrous sulfate and monitor.   Fluid Overload- Expected post-operative. Patient is below pre-operative weight this morning. I/O -[phone] the last  24hours.  Currently receiving CRRT, about 160 ml/hr,Nephrology following.  Renal Impairement -sCr around 1.79this morning, this may be closer to his baseline. Currently on CRRT. Nephrology following  Hypokalemia -K+ 3.3this morning. Supplementation ordered  Elevated LFTs -AST improved to 76, ALT improved to 194. Elevation was most likely acomponent of MSOF following shock.   Post-operative Ileus- . Flexiseal in place due to large loose stool secondary to TPN. Abdomen clinically is improved.   CBG -184/225/164- Continue ACHS glucose checks and SSI PRN.   Deconditioned - Continue PT, ST to do swallow study post extubation  Edison Simon, PA-S 10/22/2016 8:23 AM   I have seen and examined the patient and agree with the assessment and plan as outlined.  Now back in AF with slightly increased HR.  Will give extra bolus amiodarone IV.  May not be absorbing amiodarone via tube very well - will consider resuming drip if AF persists.  Rexene Alberts, MD 10/22/2016 9:29 AM

## 2016-10-22 NOTE — Progress Notes (Signed)
Patient ID: [name], male   DOB: 11-01-1930, 83 y.o.   MRN: [id number] EVENING ROUNDS NOTE :     [address]       [place] [address]             [phone]                 13 Days Post-Op Procedure(s) (LRB): CORONARY ARTERY BYPASS GRAFTING (CABG) x two, using left internal mammary artery and right leg greater saphenous vein harvested endoscopically (N/A) MAZE (N/A) TRANSESOPHAGEAL ECHOCARDIOGRAM (TEE) (N/A) CLIPPING OF ATRIAL APPENDAGE  Total Length of Stay:  LOS: 13 days  BP 124/77   Pulse (!) 109   Temp 98.3 F (36.8 C) (Oral)   Resp 18   Ht 5\' 8"  (1.727 m)   Wt 216 lb 0.8 oz (98 kg)   SpO2 96%   BMI 32.85 kg/m   .Intake/Output      10/09 0701 - 10/10 0700 10/10 0701 - 10/11 0700   P.O. 30    I.V. (mL/kg) 10.2 (0.1) 130 (1.3)   NG/GT 930 480   IV Piggyback  317   Total Intake(mL/kg) 970.2 (9.9) 927 (9.5)   Emesis/NG output 0    Other 2935 1946   Stool 900 125   Chest Tube 30    Total Output 3865 2071   Net -2894.8 -1144        Urine Occurrence 5 x 1 x   Stool Occurrence 5 x 1 x     . sodium chloride    . sodium chloride    . cefTRIAXone (ROCEPHIN)  IV Stopped (10/22/16 1808)  . feeding supplement (VITAL HIGH PROTEIN) 1,000 mL (10/22/16 1800)  . heparin 10,000 units/ 20 mL infusion syringe 950 Units/hr (10/22/16 1809)  . heparin 999 mL/hr at 10/21/16 1705  . dialysis replacement fluid (prismasate) 300 mL/hr at 10/22/16 1757  . dialysis replacement fluid (prismasate) 200 mL/hr at 10/21/16 2212  . dialysate (PRISMASATE) 1,000 mL/hr at 10/22/16 1411     Lab Results  Component Value Date   WBC 15.5 (H) 10/22/2016   HGB 9.2 (L) 10/22/2016   HCT 27.0 (L) 10/22/2016   PLT 156 10/22/2016   GLUCOSE 160 (H) 10/22/2016   CHOL 179 08/20/2014   TRIG 105 08/20/2014   HDL 30 (L) 08/20/2014   LDLCALC 128 (H) 08/20/2014   ALT 194 (H) 10/22/2016   AST 76 (H) 10/22/2016   NA 139 10/22/2016   K 3.7 10/22/2016   CL 101 10/22/2016   CREATININE 1.60 (H) 10/22/2016   BUN 34 (H) 10/22/2016   CO2 26 10/22/2016   TSH 6.623 (H) 10/01/2016   INR 1.77 10/22/2016   HGBA1C 7.2 (H) 10/06/2016   Stable day Remains off vent On cvvh   Grace Isaac MD  Beeper [phone] Office [phone] 10/22/2016 6:56 PM

## 2016-10-22 NOTE — Evaluation (Signed)
Clinical/Bedside Swallow Evaluation Patient Details  Name: [name] MRN: [id number] Date of Birth: 1930-09-02  Today's Date: 10/22/2016 Time: SLP Start Time (ACUTE ONLY): 4098 SLP Stop Time (ACUTE ONLY): 0845 SLP Time Calculation (min) (ACUTE ONLY): 21 min  Past Medical History:  Past Medical History:  Diagnosis Date  . Anemia   . Asthma   . Atrial fibrillation ([facility]) 12/08/2013  . CHF (congestive heart failure) ([place])   . Chronic joint pain    "from RA" (10/01/2016)  . CKD (chronic kidney disease), stage III   . Constipation    takes stool softener daily  . COPD (chronic obstructive pulmonary disease) ([facility])   . Coronary artery disease   . Coronary artery disease involving native coronary artery of native heart with angina pectoris ([facility]) 09/30/2016  . Dysrhythmia    a-fib  . GERD (gastroesophageal reflux disease)    takes Zantac daily   . History of blood transfusion    "related to one of my ORs"  . Hyperlipidemia    takes Pravastatin daily  . Hypertension    takes Cardizem,Proscar,and Lisinopril daily  . Hypothyroidism   . Impaired hearing    wears hearing aids  . Joint swelling    "from RA" (10/01/2016)  . Peripheral neuropathy   . Pneumonia 2017  . Rheumatoid arthritis ([place])    "all over; take orencia  q 4 wks" (10/01/2016)  . S/P CABG x 2 10/09/2016   LIMA to LAD, SVG to OM, EVH via right thigh  . S/P Maze operation for atrial fibrillation 10/09/2016   Complete bilateral atrial lesion set using bipolar radiofrequency and cryothermy ablation with clipping of LA appendage  . Type II diabetes mellitus ([place])    Past Surgical History:  Past Surgical History:  Procedure Laterality Date  . AMPUTATION TOE Left 11/08/2015   Procedure: LEFT HALLUX AMPUTATION AT THE METATARSOPHALANGEAL JOINT;  Surgeon: Wylene Simmer, MD;  Location: [facility];  Service: Orthopedics;  Laterality: Left;  . ANKLE FUSION Left 2014  . BIOPSY THYROID  2014  . CARDIAC CATHETERIZATION   09/30/2016  . CARPAL TUNNEL RELEASE Bilateral 1990's  . CATARACT EXTRACTION W/ INTRAOCULAR LENS  IMPLANT, BILATERAL Bilateral 1990's  . CLIPPING OF ATRIAL APPENDAGE  10/09/2016   Procedure: CLIPPING OF ATRIAL APPENDAGE;  Surgeon: Rexene Alberts, MD;  Location: [facility];  Service: Open Heart Surgery;;  . COLONOSCOPY    . CORONARY ARTERY BYPASS GRAFT N/A 10/09/2016   Procedure: CORONARY ARTERY BYPASS GRAFTING (CABG) x two, using left internal mammary artery and right leg greater saphenous vein harvested endoscopically;  Surgeon: Rexene Alberts, MD;  Location: [facility];  Service: Open Heart Surgery;  Laterality: N/A;  . FOREIGN BODY REMOVAL Right 09/06/2013   Procedure: REMOVAL FOREIGN BODY RIGHT INDEX FINGER;  Surgeon: Daryll Brod, MD;  Location: [facility];  Service: Orthopedics;  Laterality: Right;  ANESTHESIA: IV REGIONAL FAB  . JOINT REPLACEMENT    . MAZE N/A 10/09/2016   Procedure: MAZE;  Surgeon: Rexene Alberts, MD;  Location: [facility];  Service: Open Heart Surgery;  Laterality: N/A;  . REPAIR TENDONS FOOT Left 1946; 1984   "farming accident; repaired tendons both times"  . RIGHT/LEFT HEART CATH AND CORONARY ANGIOGRAPHY N/A 09/30/2016   Procedure: RIGHT/LEFT HEART CATH AND CORONARY ANGIOGRAPHY;  Surgeon: Nigel Mormon, MD;  Location: [facility];  Service: Cardiovascular;  Laterality: N/A;  . SHOULDER ARTHROSCOPY W/ ROTATOR CUFF REPAIR Right 1990's  . TEE WITHOUT  CARDIOVERSION N/A 10/09/2016   Procedure: TRANSESOPHAGEAL ECHOCARDIOGRAM (TEE);  Surgeon: Rexene Alberts, MD;  Location: [facility];  Service: Open Heart Surgery;  Laterality: N/A;  . THYROIDECTOMY Right 02/01/2013   Procedure: RIGHT THYROIDECTOMY LOBECTOMY;  Surgeon: Earnstine Regal, MD;  Location: [facility];  Service: General;  Laterality: Right;  . TONSILLECTOMY  1930's  . TOTAL KNEE ARTHROPLASTY Right 12/16/2010   Procedure: TOTAL KNEE ARTHROPLASTY; rt Surgeon: Rudean Haskell, MD;  Location: [facility];  Service:  Orthopedics;  Laterality: Right;  . TOTAL SHOULDER REPLACEMENT Left 03/2010   HPI:  Patient is an 83 year old male with history of hypertension, COPD, GERD, pna, chronic diastolic congestive heart failure, stage IV chronic kidney disease, hyperlipidemia, recurrent paroxysmal atrial fibrillation, bronchiectasis, asthma, rheumatoid arthritis, type 2 diabetes mellitus, and anemia. Underwent CABG 9/27. Intubated 9/27-9/28, 10/3-10/8. CXR RIGHT pigtail catheter no longer seen. No pneumothorax. Slight improvement in pulmonary edema.   Assessment / Plan / Recommendation Clinical Impression  Pt exhibits suspected pharyngeal dysphagia s/p 6 day intubation and deconditioning after CABG. Vocal quality clear with adequate intensity, strong volitional cough. Immediate cough following 3 oz water test, wet vocal quality and multiple swallows after pudding. Will need objective swallow assessment however, not recmmended today given response to pudding consistency. ST will return and perform FEES when ready, hopefully tomorrow. Consistent coughing following ice chips. If pt consistently requesting po's, may give several ice chips following oral care.    SLP Visit Diagnosis: Dysphagia, unspecified (R13.10)    Aspiration Risk  Moderate aspiration risk    Diet Recommendation NPO   Medication Administration: Via alternative means    Other  Recommendations Oral Care Recommendations: Oral care prior to ice chip/H20   Follow up Recommendations  (TBD)      Frequency and Duration            Prognosis        Swallow Study   General HPI: Patient is an 83 year old male with history of hypertension, COPD, GERD, pna, chronic diastolic congestive heart failure, stage IV chronic kidney disease, hyperlipidemia, recurrent paroxysmal atrial fibrillation, bronchiectasis, asthma, rheumatoid arthritis, type 2 diabetes mellitus, and anemia. Underwent CABG 9/27. Intubated 9/27-9/28, 10/3-10/8. CXR RIGHT pigtail catheter no  longer seen. No pneumothorax. Slight improvement in pulmonary edema. Type of Study: Bedside Swallow Evaluation Previous Swallow Assessment:  (none) Diet Prior to this Study: NPO;NG Tube Temperature Spikes Noted: No Respiratory Status: Nasal cannula History of Recent Intubation: Yes Length of Intubations (days): 6 days (day of surgery, reintubated 6 days) Date extubated: 10/20/16 Behavior/Cognition: Alert;Cooperative;Pleasant mood;Requires cueing Oral Cavity Assessment: Within Functional Limits Oral Care Completed by SLP: Recent completion by staff Oral Cavity - Dentition: Dentures, top;Dentures, bottom Vision: Functional for self-feeding Self-Feeding Abilities: Needs assist;Needs set up Patient Positioning: Upright in bed Baseline Vocal Quality: Normal Volitional Cough: Strong Volitional Swallow: Able to elicit    Oral/Motor/Sensory Function Overall Oral Motor/Sensory Function: Within functional limits   Ice Chips Ice chips: Impaired Presentation: Spoon Pharyngeal Phase Impairments: Cough - Delayed   Thin Liquid Thin Liquid: Impaired Presentation: Cup Pharyngeal  Phase Impairments: Cough - Immediate;Multiple swallows    Nectar Thick Nectar Thick Liquid: Not tested   Honey Thick Honey Thick Liquid: Not tested   Puree Puree: Impaired Pharyngeal Phase Impairments: Multiple swallows;Wet Vocal Quality   Solid   GO   Solid: Not tested        [name] 10/22/2016,8:57 AM  [name] Colvin Caroli[employer] [phone]

## 2016-10-23 LAB — GLUCOSE, CAPILLARY
GLUCOSE-CAPILLARY: 113 mg/dL — AB (ref 65–99)
GLUCOSE-CAPILLARY: 184 mg/dL — AB (ref 65–99)
GLUCOSE-CAPILLARY: 186 mg/dL — AB (ref 65–99)
GLUCOSE-CAPILLARY: 366 mg/dL — AB (ref 65–99)
Glucose-Capillary: 227 mg/dL — ABNORMAL HIGH (ref 65–99)
Glucose-Capillary: 280 mg/dL — ABNORMAL HIGH (ref 65–99)
Glucose-Capillary: 383 mg/dL — ABNORMAL HIGH (ref 65–99)

## 2016-10-23 LAB — CBC
HCT: 28 % — ABNORMAL LOW (ref 39.0–52.0)
Hemoglobin: 8.9 g/dL — ABNORMAL LOW (ref 13.0–17.0)
MCH: 29.2 pg (ref 26.0–34.0)
MCHC: 31.8 g/dL (ref 30.0–36.0)
MCV: 91.8 fL (ref 78.0–100.0)
PLATELETS: 193 10*3/uL (ref 150–400)
RBC: 3.05 MIL/uL — AB (ref 4.22–5.81)
RDW: 17.6 % — AB (ref 11.5–15.5)
WBC: 13.9 10*3/uL — AB (ref 4.0–10.5)

## 2016-10-23 LAB — COMPREHENSIVE METABOLIC PANEL
ALBUMIN: 2.1 g/dL — AB (ref 3.5–5.0)
ALT: 143 U/L — ABNORMAL HIGH (ref 17–63)
ANION GAP: 12 (ref 5–15)
AST: 54 U/L — ABNORMAL HIGH (ref 15–41)
Alkaline Phosphatase: 287 U/L — ABNORMAL HIGH (ref 38–126)
BILIRUBIN TOTAL: 1.1 mg/dL (ref 0.3–1.2)
BUN: 45 mg/dL — ABNORMAL HIGH (ref 6–20)
CO2: 24 mmol/L (ref 22–32)
Calcium: 8.1 mg/dL — ABNORMAL LOW (ref 8.9–10.3)
Chloride: 103 mmol/L (ref 101–111)
Creatinine, Ser: 1.96 mg/dL — ABNORMAL HIGH (ref 0.61–1.24)
GFR calc Af Amer: 34 mL/min — ABNORMAL LOW (ref >60)
GFR calc non Af Amer: 30 mL/min — ABNORMAL LOW (ref >60)
GLUCOSE: 197 mg/dL — AB (ref 65–99)
POTASSIUM: 3.4 mmol/L — AB (ref 3.5–5.1)
SODIUM: 139 mmol/L (ref 135–145)
TOTAL PROTEIN: 5.3 g/dL — AB (ref 6.5–8.1)

## 2016-10-23 LAB — RENAL FUNCTION PANEL
ALBUMIN: 2.2 g/dL — AB (ref 3.5–5.0)
ANION GAP: 13 (ref 5–15)
BUN: 57 mg/dL — ABNORMAL HIGH (ref 6–20)
CALCIUM: 8.1 mg/dL — AB (ref 8.9–10.3)
CO2: 23 mmol/L (ref 22–32)
Chloride: 103 mmol/L (ref 101–111)
Creatinine, Ser: 2.36 mg/dL — ABNORMAL HIGH (ref 0.61–1.24)
GFR, EST AFRICAN AMERICAN: 27 mL/min — AB (ref >60)
GFR, EST NON AFRICAN AMERICAN: 24 mL/min — AB (ref >60)
GLUCOSE: 147 mg/dL — AB (ref 65–99)
PHOSPHORUS: 2.1 mg/dL — AB (ref 2.5–4.6)
Potassium: 3.4 mmol/L — ABNORMAL LOW (ref 3.5–5.1)
SODIUM: 139 mmol/L (ref 135–145)

## 2016-10-23 LAB — POCT ACTIVATED CLOTTING TIME
Activated Clotting Time: 142 seconds
Activated Clotting Time: 131 seconds

## 2016-10-23 LAB — POCT I-STAT, CHEM 8
BUN: 49 mg/dL — ABNORMAL HIGH (ref 6–20)
CALCIUM ION: 1.05 mmol/L — AB (ref 1.15–1.40)
Chloride: 101 mmol/L (ref 101–111)
Creatinine, Ser: 2.3 mg/dL — ABNORMAL HIGH (ref 0.61–1.24)
Glucose, Bld: 141 mg/dL — ABNORMAL HIGH (ref 65–99)
HCT: 27 % — ABNORMAL LOW (ref 39.0–52.0)
Hemoglobin: 9.2 g/dL — ABNORMAL LOW (ref 13.0–17.0)
Potassium: 3.4 mmol/L — ABNORMAL LOW (ref 3.5–5.1)
SODIUM: 140 mmol/L (ref 135–145)
TCO2: 23 mmol/L (ref 22–32)

## 2016-10-23 LAB — PROTIME-INR
INR: 1.43
Prothrombin Time: 17.3 seconds — ABNORMAL HIGH (ref 11.4–15.2)

## 2016-10-23 LAB — PHOSPHORUS: Phosphorus: 1.6 mg/dL — ABNORMAL LOW (ref 2.5–4.6)

## 2016-10-23 LAB — MAGNESIUM: Magnesium: 2.3 mg/dL (ref 1.7–2.4)

## 2016-10-23 LAB — APTT: aPTT: 35 seconds (ref 24–36)

## 2016-10-23 MED ORDER — PRO-STAT SUGAR FREE PO LIQD
30.0000 mL | Freq: Three times a day (TID) | ORAL | Status: DC
  Administered 2016-10-23 – 2016-10-30 (×17): 30 mL
  Filled 2016-10-23 (×16): qty 30

## 2016-10-23 MED ORDER — AMIODARONE HCL IN DEXTROSE 360-4.14 MG/200ML-% IV SOLN
60.0000 mg/h | INTRAVENOUS | Status: AC
  Administered 2016-10-23: 60 mg/h via INTRAVENOUS

## 2016-10-23 MED ORDER — POTASSIUM CHLORIDE 20 MEQ/15ML (10%) PO SOLN
20.0000 meq | Freq: Once | ORAL | Status: AC
  Administered 2016-10-23: 20 meq via ORAL
  Filled 2016-10-23: qty 15

## 2016-10-23 MED ORDER — OSMOLITE 1.5 CAL PO LIQD
1000.0000 mL | ORAL | Status: DC
  Administered 2016-10-23 – 2016-10-24 (×2): 1000 mL
  Filled 2016-10-23 (×5): qty 1000

## 2016-10-23 MED ORDER — AMIODARONE LOAD VIA INFUSION
150.0000 mg | Freq: Once | INTRAVENOUS | Status: AC
  Administered 2016-10-23: 150 mg via INTRAVENOUS
  Filled 2016-10-23: qty 83.34

## 2016-10-23 MED ORDER — POTASSIUM CHLORIDE 10 MEQ/50ML IV SOLN
10.0000 meq | INTRAVENOUS | Status: AC
  Administered 2016-10-23 (×3): 10 meq via INTRAVENOUS
  Filled 2016-10-23: qty 50

## 2016-10-23 MED ORDER — RESOURCE THICKENUP CLEAR PO POWD
ORAL | Status: DC | PRN
  Filled 2016-10-23: qty 125

## 2016-10-23 MED ORDER — GERHARDT'S BUTT CREAM
TOPICAL_CREAM | Freq: Two times a day (BID) | CUTANEOUS | Status: DC
  Administered 2016-10-23 – 2016-10-26 (×7): via TOPICAL
  Administered 2016-10-27: 1 via TOPICAL
  Administered 2016-10-27: 09:00:00 via TOPICAL
  Administered 2016-10-28: 1 via TOPICAL
  Administered 2016-10-28 – 2016-10-30 (×5): via TOPICAL
  Administered 2016-10-31: 1 via TOPICAL
  Administered 2016-10-31 – 2016-11-01 (×2): via TOPICAL
  Administered 2016-11-01: 1 via TOPICAL
  Administered 2016-11-02: 09:00:00 via TOPICAL
  Administered 2016-11-02: 1 via TOPICAL
  Administered 2016-11-03: 10:00:00 via TOPICAL
  Filled 2016-10-23 (×5): qty 1

## 2016-10-23 MED ORDER — AMIODARONE HCL IN DEXTROSE 360-4.14 MG/200ML-% IV SOLN
INTRAVENOUS | Status: AC
  Administered 2016-10-23: 150 mg via INTRAVENOUS
  Filled 2016-10-23: qty 400

## 2016-10-23 MED ORDER — INSULIN DETEMIR 100 UNIT/ML ~~LOC~~ SOLN
20.0000 [IU] | Freq: Two times a day (BID) | SUBCUTANEOUS | Status: DC
  Administered 2016-10-23 – 2016-10-27 (×8): 20 [IU] via SUBCUTANEOUS
  Filled 2016-10-23 (×12): qty 0.2

## 2016-10-23 MED ORDER — SODIUM PHOSPHATES 45 MMOLE/15ML IV SOLN
20.0000 mmol | Freq: Once | INTRAVENOUS | Status: AC
  Administered 2016-10-23: 20 mmol via INTRAVENOUS
  Filled 2016-10-23: qty 6.67

## 2016-10-23 MED ORDER — AMIODARONE HCL IN DEXTROSE 360-4.14 MG/200ML-% IV SOLN
30.0000 mg/h | INTRAVENOUS | Status: AC
Start: 2016-10-23 — End: 2016-10-24
  Administered 2016-10-24: 30 mg/h via INTRAVENOUS
  Filled 2016-10-23 (×2): qty 200

## 2016-10-23 NOTE — Significant Event (Signed)
CRRT filter clotted at 10:00am-no prior warnings from machine. Unable to return blood. Dr. Justin Mend made aware. MD has given orders not to restart CRRT. HD catheter lumens filled with heparin 1.4cc on each lumens. Family updated at bedside.    [name]

## 2016-10-23 NOTE — Progress Notes (Signed)
Nutrition Follow-up  DOCUMENTATION CODES:   Obesity unspecified  INTERVENTION:   Osmolite 1.5 @ 50 ml/hr (1200 ml/day) 30 ml Prostat TID Provides: 2100 kcal, 120 grams protein, 914 ml free water.   NUTRITION DIAGNOSIS:   Increased nutrient needs related to  (recent surgery) as evidenced by estimated needs. Ongoing.   GOAL:   Patient will meet greater than or equal to 90% of their needs Progressing.   MONITOR:   PO intake, Diet advancement, TF tolerance, Labs  ASSESSMENT:   Pt with PMH of HTN, CHF, stage IV CKD, hyperlipidemia, afib, COPD, DM admitted for CABG x 2 and MAZE. Post op pt had renal and respiratory failure required intubation and CRRT 10/3.   10/8 extubated Spoke with SLP, diet advanced to puree with no liquids. Will monitor PO intake and need for enteral nutrition support CRRT line clotted, plan for iHD on 10/12 Has Cortrak tube in place  Medications reviewed and include: folic acid, levemir, synthroid, MVI Labs reviewed: K+ 3.4 (L), PO4 1.6 (L) CBG's: [phone]  No UOP documented today  TF: Vital High Protein @ goal rate of 30 ml/hr 60 ml Prostat TID Provides: 1320 kcal, 153 grams protein, and 601 ml free water.   Diet Order:  DIET - DYS 1 Room service appropriate? Yes; Fluid consistency: Pudding Thick  Skin:   (MASD perineum, 2 wounds on penis, chest/leg incisions)  Last BM:  325 + 1 unmeasured x 24 hours, 150 ml today via rectal tube (per RN also leaking)  Height:   Ht Readings from Last 1 Encounters:  10/10/16 5\' 8"  (1.727 m)    Weight:   Wt Readings from Last 1 Encounters:  10/23/16 213 lb 13.5 oz (97 kg)    Ideal Body Weight:  70 kg  BMI:  Body mass index is 32.52 kg/m.  Estimated Nutritional Needs:   Kcal:  1900-2100  Protein:  120-135 grams  Fluid:  1.2 L/day  EDUCATION NEEDS:   No education needs identified at this time  [place], [place], [place] Pager [phone] After Hours Pager

## 2016-10-23 NOTE — Progress Notes (Signed)
TCTS BRIEF SICU PROGRESS NOTE  14 Days Post-Op  S/P Procedure(s) (LRB): CORONARY ARTERY BYPASS GRAFTING (CABG) x two, using left internal mammary artery and right leg greater saphenous vein harvested endoscopically (N/A) MAZE (N/A) TRANSESOPHAGEAL ECHOCARDIOGRAM (TEE) (N/A) CLIPPING OF ATRIAL APPENDAGE   Stable day.  Off CVVHD Stood up w/ assistance but still very weak FEES done now cleared for dysphagia diet Back in NSR w/ stable BP  Plan: Continue current plan  Rexene Alberts, MD 10/23/2016 6:05 PM

## 2016-10-23 NOTE — Progress Notes (Signed)
  Speech Language Pathology Treatment: Dysphagia  Patient Details Name: [name] MRN: [id number] DOB: [birth year] Today's Date: 10/23/2016 Time: 0905-0920 SLP Time Calculation (min) (ACUTE ONLY): 15 min  Assessment / Plan / Recommendation Clinical Impression  Pt seen this morning for po trials and readiness for FEES. No overt s/s aspiration with puree however suspect thin liquids are penetrating/aspirating indicated by delayed cough. FEES scheduled for today at 1:30.   HPI HPI: Patient is an 83 year old male with history of hypertension, COPD, GERD, pna, chronic diastolic congestive heart failure, stage IV chronic kidney disease, hyperlipidemia, recurrent paroxysmal atrial fibrillation, bronchiectasis, asthma, rheumatoid arthritis, type 2 diabetes mellitus, and anemia. Underwent CABG 9/27. Intubated 9/27-9/28, 10/3-10/8. CXR RIGHT pigtail catheter no longer seen. No pneumothorax. Slight improvement in pulmonary edema.      SLP Plan  New goals to be determined pending instrumental study       Recommendations  Diet recommendations: NPO Medication Administration: Via alternative means                Follow up Recommendations:  (TBD) SLP Visit Diagnosis: Dysphagia, unspecified (R13.10) Plan: New goals to be determined pending instrumental study       GO                [name] 10/23/2016, 3:04 PM  [name] Colvin [employer] [phone]

## 2016-10-23 NOTE — Procedures (Signed)
Objective Swallowing Evaluation: Type of Study: FEES-Fiberoptic Endoscopic Evaluation of Swallow  Patient Details  Name: [name] MRN: [id number] Date of Birth: 09/27/1930  Today's Date: 10/23/2016 Time: SLP Start Time (ACUTE ONLY): 0905-SLP Stop Time (ACUTE ONLY): 0920 SLP Time Calculation (min) (ACUTE ONLY): 15 min  Past Medical History:  Past Medical History:  Diagnosis Date  . Anemia   . Asthma   . Atrial fibrillation ([facility]) 12/08/2013  . CHF (congestive heart failure) ([facility])   . Chronic joint pain    "from RA" (10/01/2016)  . CKD (chronic kidney disease), stage III   . Constipation    takes stool softener daily  . COPD (chronic obstructive pulmonary disease) ([facility])   . Coronary artery disease   . Coronary artery disease involving native coronary artery of native heart with angina pectoris ([facility]) 09/30/2016  . Dysrhythmia    a-fib  . GERD (gastroesophageal reflux disease)    takes Zantac daily   . History of blood transfusion    "related to one of my ORs"  . Hyperlipidemia    takes Pravastatin daily  . Hypertension    takes Cardizem,Proscar,and Lisinopril daily  . Hypothyroidism   . Impaired hearing    wears hearing aids  . Joint swelling    "from RA" (10/01/2016)  . Peripheral neuropathy   . Pneumonia 2017  . Rheumatoid arthritis ([facility])    "all over; take orencia  q 4 wks" (10/01/2016)  . S/P CABG x 2 10/09/2016   LIMA to LAD, SVG to OM, EVH via right thigh  . S/P Maze operation for atrial fibrillation 10/09/2016   Complete bilateral atrial lesion set using bipolar radiofrequency and cryothermy ablation with clipping of LA appendage  . Type II diabetes mellitus ([facility])    Past Surgical History:  Past Surgical History:  Procedure Laterality Date  . AMPUTATION TOE Left 11/08/2015   Procedure: LEFT HALLUX AMPUTATION AT THE METATARSOPHALANGEAL JOINT;  Surgeon: Wylene Simmer, MD;  Location: [facility];  Service: Orthopedics;  Laterality: Left;  . ANKLE FUSION  Left 2014  . BIOPSY THYROID  2014  . CARDIAC CATHETERIZATION  09/30/2016  . CARPAL TUNNEL RELEASE Bilateral 1990's  . CATARACT EXTRACTION W/ INTRAOCULAR LENS  IMPLANT, BILATERAL Bilateral 1990's  . CLIPPING OF ATRIAL APPENDAGE  10/09/2016   Procedure: CLIPPING OF ATRIAL APPENDAGE;  Surgeon: Rexene Alberts, MD;  Location: [facility];  Service: Open Heart Surgery;;  . COLONOSCOPY    . CORONARY ARTERY BYPASS GRAFT N/A 10/09/2016   Procedure: CORONARY ARTERY BYPASS GRAFTING (CABG) x two, using left internal mammary artery and right leg greater saphenous vein harvested endoscopically;  Surgeon: Rexene Alberts, MD;  Location: [facility];  Service: Open Heart Surgery;  Laterality: N/A;  . FOREIGN BODY REMOVAL Right 09/06/2013   Procedure: REMOVAL FOREIGN BODY RIGHT INDEX FINGER;  Surgeon: Daryll Brod, MD;  Location: [facility];  Service: Orthopedics;  Laterality: Right;  ANESTHESIA: IV REGIONAL FAB  . JOINT REPLACEMENT    . MAZE N/A 10/09/2016   Procedure: MAZE;  Surgeon: Rexene Alberts, MD;  Location: [facility];  Service: Open Heart Surgery;  Laterality: N/A;  . REPAIR TENDONS FOOT Left 1946; 1984   "farming accident; repaired tendons both times"  . RIGHT/LEFT HEART CATH AND CORONARY ANGIOGRAPHY N/A 09/30/2016   Procedure: RIGHT/LEFT HEART CATH AND CORONARY ANGIOGRAPHY;  Surgeon: Nigel Mormon, MD;  Location: [facility];  Service: Cardiovascular;  Laterality: N/A;  . SHOULDER ARTHROSCOPY W/ ROTATOR  CUFF REPAIR Right 1990's  . TEE WITHOUT CARDIOVERSION N/A 10/09/2016   Procedure: TRANSESOPHAGEAL ECHOCARDIOGRAM (TEE);  Surgeon: Rexene Alberts, MD;  Location: [facility];  Service: Open Heart Surgery;  Laterality: N/A;  . THYROIDECTOMY Right 02/01/2013   Procedure: RIGHT THYROIDECTOMY LOBECTOMY;  Surgeon: Earnstine Regal, MD;  Location: [facility];  Service: General;  Laterality: Right;  . TONSILLECTOMY  1930's  . TOTAL KNEE ARTHROPLASTY Right 12/16/2010   Procedure: TOTAL KNEE ARTHROPLASTY; rt  Surgeon: Rudean Haskell, MD;  Location: [facility];  Service: Orthopedics;  Laterality: Right;  . TOTAL SHOULDER REPLACEMENT Left 03/2010   HPI: Patient is an 83 year old male with history of hypertension, COPD, GERD, pna, chronic diastolic congestive heart failure, stage IV chronic kidney disease, hyperlipidemia, recurrent paroxysmal atrial fibrillation, bronchiectasis, asthma, rheumatoid arthritis, type 2 diabetes mellitus, and anemia. Underwent CABG 9/27. Intubated 9/27-9/28, 10/3-10/8. CXR RIGHT pigtail catheter no longer seen. No pneumothorax. Slight improvement in pulmonary edema.  No Data Recorded   Assessment / Plan / Recommendation  CHL IP CLINICAL IMPRESSIONS 10/23/2016  Clinical Impression Laryngeal structures did not appear significantly edematous following 6 day intubation. Mild oral dysphagia with decreased manipulation and oral transit. Timely swallow initiation; decreased laryngeal closure resulted in penetration to vocal cords (silent) with teaspoon nectar and penetration with honey thick higher into vestibule. Vallecular and pyriform sinus residue decreased with verbal cues for second dry swallow. Given pt's deconditioning and endurance recommend Dys 1 and pudding thick liquids, 2 swallows, sit upright. Following oral care pt may have (4-5) ice chips with full supervision. May benefit from respiratory muscle strength training.    SLP Visit Diagnosis Dysphagia, unspecified (R13.10)  Attention and concentration deficit following --  Frontal lobe and executive function deficit following --  Impact on safety and function --      CHL IP TREATMENT RECOMMENDATION 10/22/2016  Treatment Recommendations Defer until completion of intrumental exam     No flowsheet data found.  No flowsheet data found.    No flowsheet data found.    CHL IP FOLLOW UP RECOMMENDATIONS 10/23/2016  Follow up Recommendations (No Data)      No flowsheet data found.         CHL IP ORAL PHASE 10/23/2016   Oral Phase Impaired  Oral - Pudding Teaspoon --  Oral - Pudding Cup --  Oral - Honey Teaspoon Delayed oral transit  Oral - Honey Cup --  Oral - Nectar Teaspoon Delayed oral transit  Oral - Nectar Cup --  Oral - Nectar Straw --  Oral - Thin Teaspoon --  Oral - Thin Cup --  Oral - Thin Straw --  Oral - Puree Delayed oral transit  Oral - Mech Soft --  Oral - Regular --  Oral - Multi-Consistency --  Oral - Pill --  Oral Phase - Comment --    CHL IP PHARYNGEAL PHASE 10/23/2016  Pharyngeal Phase --  Pharyngeal- Pudding Teaspoon --  Pharyngeal --  Pharyngeal- Pudding Cup --  Pharyngeal --  Pharyngeal- Honey Teaspoon --  Pharyngeal Material enters airway, remains ABOVE vocal cords and not ejected out  Pharyngeal- Honey Cup --  Pharyngeal --  Pharyngeal- Nectar Teaspoon --  Pharyngeal Material enters airway, CONTACTS cords and not ejected out  Pharyngeal- Nectar Cup --  Pharyngeal --  Pharyngeal- Nectar Straw --  Pharyngeal --  Pharyngeal- Thin Teaspoon --  Pharyngeal --  Pharyngeal- Thin Cup --  Pharyngeal --  Pharyngeal- Thin Straw --  Pharyngeal --  Pharyngeal- Puree --  Pharyngeal --  Pharyngeal- Mechanical Soft --  Pharyngeal --  Pharyngeal- Regular --  Pharyngeal --  Pharyngeal- Multi-consistency --  Pharyngeal --  Pharyngeal- Pill --  Pharyngeal --  Pharyngeal Comment --     CHL IP CERVICAL ESOPHAGEAL PHASE 10/23/2016  Cervical Esophageal Phase WFL  Pudding Teaspoon --  Pudding Cup --  Honey Teaspoon --  Honey Cup --  Nectar Teaspoon --  Nectar Cup --  Nectar Straw --  Thin Teaspoon --  Thin Cup --  Thin Straw --  Puree --  Mechanical Soft --  Regular --  Multi-consistency --  Pill --  Cervical Esophageal Comment --    No flowsheet data found.  [name] 10/23/2016, 3:28 PM  [name] Colvin [employer] [phone]

## 2016-10-23 NOTE — Significant Event (Signed)
Spoke with Dr. Justin Mend regarding ACT 131 this morning. Verbal orders not to increase heparin on CRRT. MD also gave verbal orders to discontinue CRRT when filter clots off.    [name]

## 2016-10-23 NOTE — Significant Event (Signed)
Nephrology on call Dr. Jimmy Footman made aware of patient's potassium 3.4 and phosphorus 2.1 levels from this afternoon's BMP. New order received for 25mEq potassium chloride suspension.    [name]

## 2016-10-23 NOTE — Progress Notes (Addendum)
[address]       [place] [address]             [phone]        CARDIOTHORACIC SURGERY PROGRESS NOTE   R14 Days Post-Op Procedure(s) (LRB): CORONARY ARTERY BYPASS GRAFTING (CABG) x two, using left internal mammary artery and right leg greater saphenous vein harvested endoscopically (N/A) MAZE (N/A) TRANSESOPHAGEAL ECHOCARDIOGRAM (TEE) (N/A) CLIPPING OF ATRIAL APPENDAGE  Subjective: Alert and awake in chair this morning. No complaints of CP, SOB, or palpitations. Remains in atrial fibrillation at 130. +BM  Objective: Vital signs: BP Readings from Last 1 Encounters:  10/23/16 (!) 100/57   Pulse Readings from Last 1 Encounters:  10/23/16 (!) 128   Resp Readings from Last 1 Encounters:  10/23/16 (!) 23   Temp Readings from Last 1 Encounters:  10/23/16 99.4 F (37.4 C) (Oral)    Hemodynamics:    Physical Exam:  Rhythm:   Atrial Fibrillation at 130  Breath sounds: Diminished bilaterally  Heart sounds:  Irregularly irregular, tachycardic, no m/r/g   Incisions:  Median sternotomy in clean, dry, and intact without erythema or edema  Abdomen:  Soft, non-tender, non-distended  Extremities:  1-2+ pitting edema bilateral LE   Intake/Output from previous day: 10/10 0701 - 10/11 0700 In: 1487 [I.V.:130; NG/GT:1020; IV Piggyback:317] Out: 4566 [Stool:325] Intake/Output this shift: No intake/output data recorded.  Lab Results:  CBC: Recent Labs  10/22/16 0315 10/22/16 1526 10/23/16 0441  WBC 15.5*  --  13.9*  HGB 8.4* 9.2* 8.9*  HCT 26.2* 27.0* 28.0*  PLT 156  --  193    BMET:  Recent Labs  10/22/16 1512 10/22/16 1526 10/23/16 0441  NA 138 139 139  K 3.5 3.7 3.4*  CL 104 101 103  CO2 26  --  24  GLUCOSE 158* 160* 197*  BUN 37* 34* 45*  CREATININE 1.76* 1.60* 1.96*  CALCIUM 7.7*  --  8.1*     PT/INR:   Recent Labs  10/23/16 0441  LABPROT 17.3*  INR 1.43    CBG (last 3)   Recent Labs  10/22/16 1959 10/23/16 0004  10/23/16 0454  GLUCAP 260* 184* 186*    ABG    Component Value Date/Time   PHART 7.417 10/20/2016 0337   PCO2ART 42.7 10/20/2016 0337   PO2ART 76.0 (L) 10/20/2016 0337   HCO3 27.5 10/20/2016 0337   TCO2 27 10/22/2016 1526   ACIDBASEDEF 1.0 10/17/2016 0007   O2SAT 71.5 10/20/2016 0345    CXR: N/A  Assessment/Plan: S/P Procedure(s) (LRB): CORONARY ARTERY BYPASS GRAFTING (CABG) x two, using left internal mammary artery and right leg greater saphenous vein harvested endoscopically (N/A) MAZE (N/A) TRANSESOPHAGEAL ECHOCARDIOGRAM (TEE) (N/A) CLIPPING OF ATRIAL APPENDAGE  Cardiovascular - Currently in atrial fibrillation at 110-130bpmon 400mg  Amiodarone BID PO, he we likely need IV amiodarone restarted today. BP 100/57No longer requiring drips.   INR - INR improved to 1.43this morning, On 2mg  Warfarin. Will continue to monitor INR.   Pulmonary - SpO2 97% on RA. Encouraged IS  Pneumothorax- Resolved.   Elevated WBC- WBC 13.9this morning. Afebrile. ABx stopped  Anemia- Expected post-operative. H&H 8.9and 28.0. Continue ferrous sulfate and monitor.   Fluid Overload- Expected post-operative. Patient is below pre-operative weight this morning. I/O -3,079in the last 24hours. Currently receiving CRRT, about 180 ml/hr,Nephrology following at bedside.  Renal Impairement -sCr around 1.96this morning, likely closer to baseline now. Currently on CRRT. Nephrology following  Hypokalemia -K+ 3.4this morning.  Supplementation ordered  Hypophosphatemia - 1.6 this morning, replacement ordered  Elevated LFTs -AST improved to 54, ALT improved to 143. Elevation was most likely acomponent of MSOF following shock.   Post-operative Ileus- Flexiseal in place due to large loose stool secondary to TPN. Abdomen clinically is improved.   CBG -260/184/186- Continue ACHS glucose checks and SSI PRN.   Deconditioned- Continue PT, Failed ST evaluation yesterday so he  continues to be NPO  Likely restart IV Amiodarone CRRT per nephrology Monitor H&H, INR, K+, LFTs Continue PT/ST Mobilize as tolerated Encouraged IS  Edison Simon, PA-S 10/23/2016 7:21 AM   I have seen and examined the patient and agree with the assessment and plan as outlined.  Patient is now below preop weight and looks hypovolemic other than some residual swelling at the ankles, which is chronic.  I favor stopping CVVHD now or soon.  He is tolerating tube feeds but I'm not sure how much he's absorbing.  Still in AF with rapid response - will convert Amiodarone back to IV.  Rexene Alberts, MD 10/23/2016 8:15 AM

## 2016-10-23 NOTE — Progress Notes (Signed)
Inpatient Diabetes Program Recommendations  AACE/ADA: New Consensus Statement on Inpatient Glycemic Control (2015)  Target Ranges:  Prepandial:   less than 140 mg/dL      Peak postprandial:   less than 180 mg/dL (1-2 hours)      Critically ill patients:  140 - 180 mg/dL   Lab Results  Component Value Date   GLUCAP 280 (H) 10/23/2016   HGBA1C 7.2 (H) 10/06/2016    Review of Glycemic ControlResults for DARAN, FAVARO (MRN [id number]) as of 10/23/2016 11:49  Ref. Range 10/23/2016 00:04 10/23/2016 04:54 10/23/2016 08:23 10/23/2016 11:19  Glucose-Capillary Latest Ref Range: 65 - 99 mg/dL 184 (H) 186 (H) 227 (H) 280 (H)   Inpatient Diabetes Program Recommendations:    If appropriate, consider adding Lantus 8 units daily.   Thanks, Adah Perl, RN, BC-ADM Inpatient Diabetes Coordinator Pager [phone] (8a-5p)

## 2016-10-23 NOTE — Progress Notes (Signed)
Speech Pathology    FEES scheduled today at 1:30    [facility] [place] [employer] [phone]

## 2016-10-23 NOTE — Progress Notes (Signed)
[facility] ROUNDING NOTE   Subjective:   Interval History: 83 y/o male underwent a CABG and MAZE on 9/27. Developed worsening renal failure post operatively requiring dialysis, needed intubation for catheter placement. On 10/3 he developed shock in the setting of a spontaneous R sided pneumothorax. Acute on chronic renal disease stage 4. Baseline creatinine 2.6 Followed by Dr Marval Regal Now on CRRT for volume overload. Patient with persistent hypotension 10/3    patient appears to be slowly making progress and pressors have been stopped - still no urine output  Objective:  Vital signs in last 24 hours:  Temp:  [98.3 F (36.8 C)-99.8 F (37.7 C)] 98.3 F (36.8 C) (10/11 0747) Pulse Rate:  [83-138] 138 (10/11 0800) Resp:  [16-31] 29 (10/11 0800) BP: (81-124)/(43-89) 103/89 (10/11 0800) SpO2:  [94 %-100 %] 97 % (10/11 0800) Weight:  [213 lb 13.5 oz (97 kg)] 213 lb 13.5 oz (97 kg) (10/11 0500)  Weight change: -2 lb 3.3 oz (-1 kg) Filed Weights   10/21/16 0400 10/22/16 0600 10/23/16 0500  Weight: 226 lb 6.6 oz (102.7 kg) 216 lb 0.8 oz (98 kg) 213 lb 13.5 oz (97 kg)    Intake/Output: I/O last 3 completed shifts: In: 1877 [I.V.:130; Other:20; NG/GT:1410; IV GOTLXBWIO:035] Out: 5974 [Other:5806; Stool:425]   Intake/Output this shift:  No intake/output data recorded.  CVS- RRR RS- CTA ABD- BS present soft non-distended EXT- 1-2 edema   Basic Metabolic Panel:  Recent Labs Lab 10/19/16 0426  10/20/16 0328  10/21/16 0303  10/21/16 1648 10/22/16 0315 10/22/16 1512 10/22/16 1526 10/23/16 0441  NA 136  < > 136  < > 135  < > 138 139 138 139 139  K 3.4*  < > 3.8  < > 5.9*  < > 3.4* 3.3* 3.5 3.7 3.4*  CL 97*  < > 100*  < > 101  < > 100* 102 104 101 103  CO2 26  < > 25  < > 26  --  _0 --  24  GLUCOSE 100*  < > 180*  < > 130*  < > 160* 165* 158* 160* 197*  BUN 23*  < > 24*  < > 31*  < > 32* 38* 37* 34* 45*  CREATININE 1.77*  < > 1.70*  < > 1.69*  <  > 1.70* 1.76* 1.76* 1.60* 1.96*  CALCIUM 7.9*  < > 8.1*  < > 8.0*  --  8.0* 7.8* 7.7*  --  8.1*  MG 2.2  --  2.3  --  2.5*  --   --  2.2  --   --  2.3  PHOS  --   < > 2.5  < > 2.7  --  2.1* 2.0* 1.1*  --  1.6*  < > = values in this interval not displayed.  Liver Function Tests:  Recent Labs Lab 10/19/16 0426  10/20/16 0328  10/21/16 0303 10/21/16 1648 10/22/16 0315 10/22/16 1512 10/23/16 0441  AST 406*  --  317*  --  201*  --  76*  --  54*  ALT 380*  --  375*  --  312*  --  194*  --  143*  ALKPHOS 328*  --  366*  --  326*  --  270*  --  287*  BILITOT 1.4*  --  1.0  --  2.2*  --  0.9  --  1.1  PROT 5.1*  --  5.2*  --  5.3*  --  4.9*  --  5.3*  ALBUMIN 1.9*  < > 1.9*  < > 2.1* 1.9* 1.9* 2.0* 2.1*  < > = values in this interval not displayed.  Recent Labs Lab 10/16/16 1515  LIPASE 23  AMYLASE 25*   No results for input(s): AMMONIA in the last 168 hours.  CBC:  Recent Labs Lab 10/19/16 0426  10/20/16 0328  10/21/16 0303 10/21/16 1635 10/22/16 0315 10/22/16 1526 10/23/16 0441  WBC 20.3*  --  21.5*  --  18.9*  --  15.5*  --  13.9*  NEUTROABS 17.4*  --  18.5*  --  16.1*  --   --   --   --   HGB 8.8*  < > 9.2*  < > 9.1* 9.5* 8.4* 9.2* 8.9*  HCT 26.4*  < > 27.9*  < > 28.3* 28.0* 26.2* 27.0* 28.0*  MCV 86.0  --  88.6  --  89.8  --  90.7  --  91.8  PLT 184  --  200  --  185  --  156  --  193  < > = values in this interval not displayed.  Cardiac Enzymes: No results for input(s): CKTOTAL, CKMB, CKMBINDEX, TROPONINI in the last 168 hours.  BNP: Invalid input(s): POCBNP  CBG:  Recent Labs Lab 10/22/16 1233 10/22/16 1959 10/23/16 0004 10/23/16 0454 10/23/16 0823  GLUCAP 165* 260* 184* 186* 227*    Microbiology: Results for orders placed or performed during the hospital encounter of 10/09/16  Culture, respiratory (NON-Expectorated)     Status: None   Collection Time: 10/16/16  8:43 AM  Result Value Ref Range Status   Specimen Description TRACHEAL ASPIRATE   Final   Special Requests NONE  Final   Gram Stain   Final    ABUNDANT WBC PRESENT, PREDOMINANTLY PMN NO ORGANISMS SEEN    Culture RARE ENTEROBACTER CLOACAE  Final   Report Status 10/19/2016 FINAL  Final   Organism ID, Bacteria ENTEROBACTER CLOACAE  Final      Susceptibility   Enterobacter cloacae - MIC*    CEFAZOLIN >=64 RESISTANT Resistant     CEFEPIME <=1 SENSITIVE Sensitive     CEFTAZIDIME <=1 SENSITIVE Sensitive     CEFTRIAXONE <=1 SENSITIVE Sensitive     CIPROFLOXACIN <=0.25 SENSITIVE Sensitive     GENTAMICIN <=1 SENSITIVE Sensitive     IMIPENEM 0.5 SENSITIVE Sensitive     TRIMETH/SULFA <=20 SENSITIVE Sensitive     PIP/TAZO <=4 SENSITIVE Sensitive     * RARE ENTEROBACTER CLOACAE  C difficile quick scan w PCR reflex     Status: None   Collection Time: 10/21/16  5:54 PM  Result Value Ref Range Status   C Diff antigen NEGATIVE NEGATIVE Final   C Diff toxin NEGATIVE NEGATIVE Final   C Diff interpretation No C. difficile detected.  Final    Coagulation Studies:  Recent Labs  10/21/16 0303 10/22/16 0315 10/23/16 0441  LABPROT 20.4* 20.5* 17.3*  INR 1.77 1.77 1.43    Urinalysis: No results for input(s): COLORURINE, LABSPEC, PHURINE, GLUCOSEU, HGBUR, BILIRUBINUR, KETONESUR, PROTEINUR, UROBILINOGEN, NITRITE, LEUKOCYTESUR in the last 72 hours.  Invalid input(s): APPERANCEUR    Imaging: Dg Chest Port 1 View  Result Date: 10/22/2016 CLINICAL DATA:  Congestive heart failure. EXAM: PORTABLE CHEST 1 VIEW COMPARISON:  10/21/2016. FINDINGS: Enlarged cardiomediastinal silhouette is stable. Central venous catheters, and enteric tube remain unchanged. There is slight clearing of pulmonary edema compared with yesterday's radiograph. Pigtail catheter overlying the RIGHT chest on previous radiographs  is no longer visualized and has presumably been removed. There is no pneumothorax. IMPRESSION: RIGHT pigtail catheter no longer seen. No pneumothorax. Slight improvement in pulmonary  edema. Electronically Signed   By: Staci Righter M.D.   On: 10/22/2016 08:16     Medications:   . sodium chloride    . sodium chloride    . cefTRIAXone (ROCEPHIN)  IV Stopped (10/22/16 1808)  . feeding supplement (VITAL HIGH PROTEIN) 1,000 mL (10/23/16 0700)  . heparin 10,000 units/ 20 mL infusion syringe 950 Units/hr (10/23/16 0125)  . heparin 999 mL/hr at 10/21/16 1705  . potassium chloride 10 mEq (10/23/16 0810)  . dialysis replacement fluid (prismasate) 300 mL/hr at 10/22/16 1757  . dialysis replacement fluid (prismasate) 200 mL/hr at 10/22/16 2355  . dialysate (PRISMASATE) 1,000 mL/hr at 10/23/16 0600  . sodium phosphate  Dextrose 5% IVPB 20 mmol (10/23/16 0827)   . amiodarone  400 mg Per Tube Q12H  . aspirin  81 mg Per Tube Daily  . chlorhexidine gluconate (MEDLINE KIT)  15 mL Mouth Rinse BID  . Chlorhexidine Gluconate Cloth  6 each Topical Q0600  . feeding supplement (PRO-STAT SUGAR FREE 64)  60 mL Per Tube TID  . folic acid  1 mg Per Tube Daily  . guaiFENesin  1,200 mg Oral BID  . insulin aspart  0-24 Units Subcutaneous Q4H  . levothyroxine  25 mcg Per Tube QAC breakfast  . mouth rinse  15 mL Mouth Rinse q12n4p  . moving right along book   Does not apply Once  . multivitamin  15 mL Per Tube Daily  . pantoprazole sodium  40 mg Per Tube Daily  . patient's guide to using coumadin book   Does not apply Once  . sodium chloride flush  10-40 mL Intracatheter Q12H  . sodium chloride flush  3 mL Intravenous Q12H  . sodium chloride flush  3 mL Intravenous Q12H  . warfarin  2 mg Per Tube q1800  . Warfarin - Physician Dosing Inpatient   Does not apply q1800   sodium chloride, fentaNYL (SUBLIMAZE) injection, fentaNYL (SUBLIMAZE) injection, heparin, heparin, heparin, midazolam, ondansetron (ZOFRAN) IV, sodium chloride flush, sodium chloride flush, sodium chloride flush  Assessment/ Plan:  1. AKI on CKD3/4- post CABG and MAZE procedure. Steady rise in creatinine, no response to  ceiling dose diuretics. Hemodynamically driven (low blood pressures) AKI in kidneys with little reserve.  1. Left IJ vas cath by CCM (10/3) 2. CRRT initiated 10/15/16 tolerating fine - FEEL TRANSITION TO IHD APPROPRIATE  3. Heparin with CRRT Recent increase  dose increase no issues though with clotting or bleeding ACT 130 a little bellow target  4.  Phosphorus replete 82mq daily 5. Replete potassium UF goal tolerating fluid removal 100- 150cc/hour Continue ultrafiltration today  2. S/P CABG/MAZE procedure - ECHO 10/2; no pericardial effusion. Anticoagulate with coumadin  3. Acute hypoxic resp failure. Pulm edema. Poss HCAP. Oxygenation improving and patient extubated successfully 10/8 4. Shock - Improved and off pressors  1. Sig hypotension 10/4 associated with spontaneous PTX.  2. CCM worried about brewing sepsis. (ATB's (v/z) for possible HCAP) rocephin stopped and white count noted patient is afebrile 3. Continues to wean pressors  5. Anemia- ABLA. No prior significant CKD related anemia (had no outpt ESA requirement) 1. Transfuse for Hb <7 (transfused 9/27) 2. Dosed Aranesp 150 10/2, then will get weekly.  3. Feraheme dosed 10/3, redose 10/10. (tsat 6%) 6. Diabetes with neuropathy 1. Stopped gabapentin (early side effects from  accumulation) 7. Hyponatremia - fluid overload. Resolved with CRRT 8. Metabolic acidosis - resolved.  9. Hypokalemia  Replete today    Looks to be doing well although not really that hopeful that he will avoid dialysis after CRRT due to poor renal function at baseline although will continue to assess for recovery    Will continue CRRT today and transition to intermittent dialysis if all is well 10/12    LOS: 14 Roneisha Stern W _0 _1 :59 AM

## 2016-10-23 NOTE — Progress Notes (Signed)
Amiodarone Drug - Drug Interaction Consult Note  Recommendations: Mr. Rumple has been on amiodarone and was just transitioned to IV due to persistent Afib/tachycardia. His amiodarone may interact with his amiodarone and increase his INR, however he was started on a low dose. The statin is being held due to shock liver, but when restarted, we will just need to monitor for myalgias. So, continue current therapy with no dosing adjustments.  Amiodarone is metabolized by the cytochrome P450 system and therefore has the potential to cause many drug interactions. Amiodarone has an average plasma half-life of 50 days (range 20 to 100 days).   There is potential for drug interactions to occur several weeks or months after stopping treatment and the onset of drug interactions may be slow after initiating amiodarone.   [x]  Statins: Increased risk of myopathy. Simvastatin- restrict dose to 20mg  daily. Other statins: counsel patients to report any muscle pain or weakness immediately.  [x]  Anticoagulants: Amiodarone can increase anticoagulant effect. Consider warfarin dose reduction. Patients should be monitored closely and the dose of anticoagulant altered accordingly, remembering that amiodarone levels take several weeks to stabilize.  Thank You,   Patterson Hammersmith PharmD PGY1 Pharmacy Practice Resident 10/23/2016 10:11 AM Pager: [phone]

## 2016-10-23 NOTE — Progress Notes (Signed)
CSW continuing to follow for transfer to SNF when medically stable  Jorge Ny, [place] Social Worker [phone]

## 2016-10-24 LAB — GLUCOSE, CAPILLARY
GLUCOSE-CAPILLARY: 140 mg/dL — AB (ref 65–99)
GLUCOSE-CAPILLARY: 263 mg/dL — AB (ref 65–99)
Glucose-Capillary: 141 mg/dL — ABNORMAL HIGH (ref 65–99)
Glucose-Capillary: 196 mg/dL — ABNORMAL HIGH (ref 65–99)
Glucose-Capillary: 338 mg/dL — ABNORMAL HIGH (ref 65–99)
Glucose-Capillary: 78 mg/dL (ref 65–99)

## 2016-10-24 LAB — RENAL FUNCTION PANEL
ALBUMIN: 2 g/dL — AB (ref 3.5–5.0)
ANION GAP: 11 (ref 5–15)
BUN: 83 mg/dL — ABNORMAL HIGH (ref 6–20)
CALCIUM: 8 mg/dL — AB (ref 8.9–10.3)
CO2: 23 mmol/L (ref 22–32)
Chloride: 108 mmol/L (ref 101–111)
Creatinine, Ser: 3.47 mg/dL — ABNORMAL HIGH (ref 0.61–1.24)
GFR calc Af Amer: 17 mL/min — ABNORMAL LOW (ref >60)
GFR, EST NON AFRICAN AMERICAN: 15 mL/min — AB (ref >60)
GLUCOSE: 130 mg/dL — AB (ref 65–99)
PHOSPHORUS: 2.2 mg/dL — AB (ref 2.5–4.6)
Potassium: 3 mmol/L — ABNORMAL LOW (ref 3.5–5.1)
SODIUM: 142 mmol/L (ref 135–145)

## 2016-10-24 LAB — BASIC METABOLIC PANEL
Anion gap: 12 (ref 5–15)
BUN: 75 mg/dL — AB (ref 6–20)
CHLORIDE: 109 mmol/L (ref 101–111)
CO2: 21 mmol/L — AB (ref 22–32)
CREATININE: 3.2 mg/dL — AB (ref 0.61–1.24)
Calcium: 8.1 mg/dL — ABNORMAL LOW (ref 8.9–10.3)
GFR calc Af Amer: 19 mL/min — ABNORMAL LOW (ref >60)
GFR calc non Af Amer: 16 mL/min — ABNORMAL LOW (ref >60)
Glucose, Bld: 188 mg/dL — ABNORMAL HIGH (ref 65–99)
Potassium: 3.3 mmol/L — ABNORMAL LOW (ref 3.5–5.1)
SODIUM: 142 mmol/L (ref 135–145)

## 2016-10-24 LAB — CBC
HEMATOCRIT: 26.5 % — AB (ref 39.0–52.0)
HEMOGLOBIN: 8.4 g/dL — AB (ref 13.0–17.0)
MCH: 29.6 pg (ref 26.0–34.0)
MCHC: 31.7 g/dL (ref 30.0–36.0)
MCV: 93.3 fL (ref 78.0–100.0)
Platelets: 198 10*3/uL (ref 150–400)
RBC: 2.84 MIL/uL — ABNORMAL LOW (ref 4.22–5.81)
RDW: 18.9 % — AB (ref 11.5–15.5)
WBC: 19.7 10*3/uL — ABNORMAL HIGH (ref 4.0–10.5)

## 2016-10-24 LAB — POCT I-STAT, CHEM 8
BUN: 70 mg/dL — ABNORMAL HIGH (ref 6–20)
CHLORIDE: 107 mmol/L (ref 101–111)
Calcium, Ion: 1.11 mmol/L — ABNORMAL LOW (ref 1.15–1.40)
Creatinine, Ser: 3.4 mg/dL — ABNORMAL HIGH (ref 0.61–1.24)
Glucose, Bld: 131 mg/dL — ABNORMAL HIGH (ref 65–99)
HEMATOCRIT: 48 % (ref 39.0–52.0)
Hemoglobin: 16.3 g/dL (ref 13.0–17.0)
Potassium: 3 mmol/L — ABNORMAL LOW (ref 3.5–5.1)
SODIUM: 144 mmol/L (ref 135–145)
TCO2: 23 mmol/L (ref 22–32)

## 2016-10-24 LAB — MAGNESIUM: Magnesium: 2.4 mg/dL (ref 1.7–2.4)

## 2016-10-24 LAB — PHOSPHORUS: Phosphorus: 2.5 mg/dL (ref 2.5–4.6)

## 2016-10-24 LAB — PROTIME-INR
INR: 1.51
PROTHROMBIN TIME: 18 s — AB (ref 11.4–15.2)

## 2016-10-24 LAB — APTT: APTT: 32 s (ref 24–36)

## 2016-10-24 MED ORDER — AMIODARONE PEDIATRIC ORAL SUSPENSION 5 MG/ML
400.0000 mg | Freq: Two times a day (BID) | ORAL | Status: DC
  Filled 2016-10-24: qty 80

## 2016-10-24 MED ORDER — POTASSIUM CHLORIDE 10 MEQ/50ML IV SOLN
10.0000 meq | INTRAVENOUS | Status: AC
  Administered 2016-10-24 (×3): 10 meq via INTRAVENOUS

## 2016-10-24 MED ORDER — ENOXAPARIN SODIUM 30 MG/0.3ML ~~LOC~~ SOLN
30.0000 mg | SUBCUTANEOUS | Status: DC
  Administered 2016-10-24 – 2016-10-26 (×2): 30 mg via SUBCUTANEOUS
  Filled 2016-10-24 (×4): qty 0.3

## 2016-10-24 MED ORDER — AMIODARONE HCL 200 MG PO TABS
400.0000 mg | ORAL_TABLET | Freq: Two times a day (BID) | ORAL | Status: DC
  Administered 2016-10-24 – 2016-10-28 (×10): 400 mg
  Filled 2016-10-24 (×10): qty 2

## 2016-10-24 NOTE — Progress Notes (Signed)
Physical Therapy Treatment Patient Details Name: [name] MRN: [id number] DOB: 22-Mar-1930 Today's Date: 10/24/2016    History of Present Illness Pt admit for CABGx2 and MAZE procedure on 9/27.  He developed worsening renal failure post operatively requiring CRRT, Intubation 9/27-10/8.   On 10/3 he developed shock in the setting of a spontaneous R sided pneumothorax.  PMHx: hypertension, chronic diastolic congestive heart failure, stage IV chronic kidney disease, hyperlipidemia, recurrent PAF on long-term anticoagulation, bronchiectasis with chronic dyspnea on exertion, COPD, asthma, RA, DM, and anemia     PT Comments    Pt is making progress towards his goals. Pt is currently modAx2 for transfers and modA for ambulation of 8 feet with EVA walker and close chair follow. Pt requires skilled PT to progress mobility and improve strength and endurance to safely navigate their discharge environment.    Follow Up Recommendations  SNF;Supervision/Assistance - 24 hour     Equipment Recommendations  Other (comment)    Recommendations for Other Services       Precautions / Restrictions Precautions Precautions: Fall;Sternal Restrictions Weight Bearing Restrictions: Yes (sternal precautions)    Mobility  Bed Mobility               General bed mobility comments: OOB in chair on arrival  Transfers Overall transfer level: Needs assistance Equipment used:  (EVA walker) Transfers: Sit to/from Stand Sit to Stand: Mod assist;+2 physical assistance         General transfer comment: modAx2 for power up, steadying and reaching to EVA walker, 3x sit<>stand  Ambulation/Gait Ambulation/Gait assistance: Mod assist Ambulation Distance (Feet): 8 Feet (2x4 feet) Assistive device:  (EVA walker) Gait Pattern/deviations: Step-through pattern;Decreased step length - right;Decreased step length - left;Trunk flexed;Antalgic;Narrow base of support Gait velocity: slowed Gait velocity  interpretation: Below normal speed for age/gender General Gait Details: modA for steadying with gait, vc for encouragement, anterior pelvic tilt, and looking up and out with gait       Balance Overall balance assessment: Needs assistance Sitting-balance support: Feet supported;No upper extremity supported Sitting balance-Leahy Scale: Fair     Standing balance support: Bilateral upper extremity supported;During functional activity Standing balance-Leahy Scale: Poor Standing balance comment: relies on UE support.                             Cognition Arousal/Alertness: Awake/alert Behavior During Therapy: Flat affect Overall Cognitive Status: Impaired/Different from baseline Area of Impairment: Orientation;Attention;Memory;Following commands                 Orientation Level: Disoriented to;Time Current Attention Level: Alternating   Following Commands: Follows multi-step commands consistently     Problem Solving: Slow processing;Requires verbal cues;Requires tactile cues General Comments: pt able to recall 2/3 sternal precautions at end of session today         General Comments General comments (skin integrity, edema, etc.): BP 126/66, HR 103 bpm, SaO2 97%O2 on RA, RR 25       Pertinent Vitals/Pain Pain Assessment: No/denies pain           PT Goals (current goals can now be found in the care plan section) Acute Rehab PT Goals PT Goal Formulation: With patient Time For Goal Achievement: 11/04/16 Potential to Achieve Goals: Fair Progress towards PT goals: Progressing toward goals    Frequency    Min 3X/week      PT Plan Current plan remains appropriate  AM-PAC PT "6 Clicks" Daily Activity  Outcome Measure  Difficulty turning over in bed (including adjusting bedclothes, sheets and blankets)?: Unable Difficulty moving from lying on back to sitting on the side of the bed? : Unable Difficulty sitting down on and standing up from a  chair with arms (e.g., wheelchair, bedside commode, etc,.)?: Unable Help needed moving to and from a bed to chair (including a wheelchair)?: Total Help needed walking in hospital room?: Total Help needed climbing 3-5 steps with a railing? : Total 6 Click Score: 6    End of Session Equipment Utilized During Treatment: Gait belt Activity Tolerance: Patient tolerated treatment well Patient left: in chair;with call bell/phone within reach;with family/visitor present Nurse Communication: Mobility status;Precautions PT Visit Diagnosis: Muscle weakness (generalized) (M62.81);Other abnormalities of gait and mobility (R26.89)     Time: 0932-3557 PT Time Calculation (min) (ACUTE ONLY): 30 min  Charges:  $Gait Training: 8-22 mins $Therapeutic Exercise: 8-22 mins                    G Codes:       Basilia Stuckert B. Migdalia Dk PT, DPT Acute Rehabilitation  [phone] Pager [phone]     [place] 10/24/2016, 11:48 AM

## 2016-10-24 NOTE — Progress Notes (Signed)
      [address]       [place] [address]             [phone]      Up in chair  BP 131/67 (BP Location: Left Arm)   Pulse 91   Temp 98.5 F (36.9 C) (Oral)   Resp 16   Ht 5\' 8"  (1.727 m)   Wt 206 lb 11.2 oz (93.8 kg)   SpO2 97%   BMI 31.43 kg/m    Intake/Output Summary (Last 24 hours) at 10/24/16 1744 Last data filed at 10/24/16 1315  Gross per 24 hour  Intake          1662.33 ml  Output              800 ml  Net           862.33 ml   K= 3.0- renal does not want to replace Cr= 3.4  For HD tomorrow  Continue present care  Steven C. Roxan Hockey, MD [facility] [phone]

## 2016-10-24 NOTE — Progress Notes (Signed)
[facility] ROUNDING NOTE   Subjective:   Interval History: 83 y/o male underwent a CABG and MAZE on 9/27. Developed worsening renal failure post operatively requiring dialysis, needed intubation for catheter placement. On 10/3 he developed shock in the setting of a spontaneous R sided pneumothorax. Acute on chronic renal disease stage 4. Baseline creatinine 2.6 Followed by Dr Marval Regal Now on CRRT for volume overload. Patient with persistent hypotension 10/3patient appears to be slowly making progress and pressors have been stopped - still no urine output   Will need intermittent dialysis initiated 10/13  Objective:  Vital signs in last 24 hours:  Temp:  [97.9 F (36.6 C)-99.1 F (37.3 C)] 98.9 F (37.2 C) (10/12 0756) Pulse Rate:  [94-133] 99 (10/12 0800) Resp:  [16-38] 27 (10/12 0800) BP: (83-129)/(44-103) 124/63 (10/12 0800) SpO2:  [95 %-100 %] 95 % (10/12 0800) Weight:  [206 lb 11.2 oz (93.8 kg)] 206 lb 11.2 oz (93.8 kg) (10/12 0500)  Weight change: -7 lb 2.3 oz (-3.242 kg) Filed Weights   10/22/16 0600 10/23/16 0500 10/24/16 0500  Weight: 216 lb 0.8 oz (98 kg) 213 lb 13.5 oz (97 kg) 206 lb 11.2 oz (93.8 kg)    Intake/Output: I/O last 3 completed shifts: In: 2927.7 [P.O.:120; I.V.:550.3; Other:70; NG/GT:1729.3; IV Piggyback:458] Out: 0865 [HQION:6295; MWUXL:2440]   Intake/Output this shift:  No intake/output data recorded.  CVS- RRR RS- CTA ABD- BS present soft non-distended EXT- 1+ edema   Basic Metabolic Panel:  Recent Labs Lab 10/20/16 0328  10/21/16 0303  10/22/16 0315 10/22/16 1512 10/22/16 1526 10/23/16 0441 10/23/16 1526 10/23/16 1552 10/24/16 0504 10/24/16 0725  NA 136  < > 135  < > 139 138 139 139 140 139  --  142  K 3.8  < > 5.9*  < > 3.3* 3.5 3.7 3.4* 3.4* 3.4*  --  3.3*  CL 100*  < > 101  < > 102 104 101 103 101 103  --  109  CO2 25  < > 26  < > 27 26  --  24  --  23  --  21*  GLUCOSE 180*  < > 130*  < > 165* 158*  160* 197* 141* 147*  --  188*  BUN 24*  < > 31*  < > 38* 37* 34* 45* 49* 57*  --  75*  CREATININE 1.70*  < > 1.69*  < > 1.76* 1.76* 1.60* 1.96* 2.30* 2.36*  --  3.20*  CALCIUM 8.1*  < > 8.0*  < > 7.8* 7.7*  --  8.1*  --  8.1*  --  8.1*  MG 2.3  --  2.5*  --  2.2  --   --  2.3  --   --  2.4  --   PHOS 2.5  < > 2.7  < > 2.0* 1.1*  --  1.6*  --  2.1* 2.5  --   < > = values in this interval not displayed.  Liver Function Tests:  Recent Labs Lab 10/19/16 0426  10/20/16 0328  10/21/16 0303 10/21/16 1648 10/22/16 0315 10/22/16 1512 10/23/16 0441 10/23/16 1552  AST 406*  --  317*  --  201*  --  76*  --  54*  --   ALT 380*  --  375*  --  312*  --  194*  --  143*  --   ALKPHOS 328*  --  366*  --  326*  --  270*  --  287*  --  BILITOT 1.4*  --  1.0  --  2.2*  --  0.9  --  1.1  --   PROT 5.1*  --  5.2*  --  5.3*  --  4.9*  --  5.3*  --   ALBUMIN 1.9*  < > 1.9*  < > 2.1* 1.9* 1.9* 2.0* 2.1* 2.2*  < > = values in this interval not displayed. No results for input(s): LIPASE, AMYLASE in the last 168 hours. No results for input(s): AMMONIA in the last 168 hours.  CBC:  Recent Labs Lab 10/19/16 0426  10/20/16 0328  10/21/16 0303  10/22/16 0315 10/22/16 1526 10/23/16 0441 10/23/16 1526 10/24/16 0725  WBC 20.3*  --  21.5*  --  18.9*  --  15.5*  --  13.9*  --  19.7*  NEUTROABS 17.4*  --  18.5*  --  16.1*  --   --   --   --   --   --   HGB 8.8*  < > 9.2*  < > 9.1*  < > 8.4* 9.2* 8.9* 9.2* 8.4*  HCT 26.4*  < > 27.9*  < > 28.3*  < > 26.2* 27.0* 28.0* 27.0* 26.5*  MCV 86.0  --  88.6  --  89.8  --  90.7  --  91.8  --  93.3  PLT 184  --  200  --  185  --  156  --  193  --  198  < > = values in this interval not displayed.  Cardiac Enzymes: No results for input(s): CKTOTAL, CKMB, CKMBINDEX, TROPONINI in the last 168 hours.  BNP: Invalid input(s): POCBNP  CBG:  Recent Labs Lab 10/23/16 2032 10/23/16 2120 10/24/16 0000 10/24/16 0453 10/24/16 0759  GLUCAP 383* 366* 263* 140* 196*     Microbiology: Results for orders placed or performed during the hospital encounter of 10/09/16  Culture, respiratory (NON-Expectorated)     Status: None   Collection Time: 10/16/16  8:43 AM  Result Value Ref Range Status   Specimen Description TRACHEAL ASPIRATE  Final   Special Requests NONE  Final   Gram Stain   Final    ABUNDANT WBC PRESENT, PREDOMINANTLY PMN NO ORGANISMS SEEN    Culture RARE ENTEROBACTER CLOACAE  Final   Report Status 10/19/2016 FINAL  Final   Organism ID, Bacteria ENTEROBACTER CLOACAE  Final      Susceptibility   Enterobacter cloacae - MIC*    CEFAZOLIN >=64 RESISTANT Resistant     CEFEPIME <=1 SENSITIVE Sensitive     CEFTAZIDIME <=1 SENSITIVE Sensitive     CEFTRIAXONE <=1 SENSITIVE Sensitive     CIPROFLOXACIN <=0.25 SENSITIVE Sensitive     GENTAMICIN <=1 SENSITIVE Sensitive     IMIPENEM 0.5 SENSITIVE Sensitive     TRIMETH/SULFA <=20 SENSITIVE Sensitive     PIP/TAZO <=4 SENSITIVE Sensitive     * RARE ENTEROBACTER CLOACAE  C difficile quick scan w PCR reflex     Status: None   Collection Time: 10/21/16  5:54 PM  Result Value Ref Range Status   C Diff antigen NEGATIVE NEGATIVE Final   C Diff toxin NEGATIVE NEGATIVE Final   C Diff interpretation No C. difficile detected.  Final    Coagulation Studies:  Recent Labs  10/22/16 0315 10/23/16 0441 10/24/16 0504  LABPROT 20.5* 17.3* 18.0*  INR 1.77 1.43 1.51    Urinalysis: No results for input(s): COLORURINE, LABSPEC, PHURINE, GLUCOSEU, HGBUR, BILIRUBINUR, KETONESUR, PROTEINUR, UROBILINOGEN, NITRITE, LEUKOCYTESUR in the last 72 hours.  Invalid input(s): APPERANCEUR    Imaging: No results found.   Medications:   . sodium chloride    . sodium chloride    . amiodarone 30 mg/hr (10/24/16 0100)  . cefTRIAXone (ROCEPHIN)  IV Stopped (10/23/16 1731)  . feeding supplement (OSMOLITE 1.5 CAL) 1,000 mL (10/24/16 0100)  . heparin 10,000 units/ 20 mL infusion syringe 950 Units/hr (10/23/16 0125)  .  heparin 999 mL/hr at 10/21/16 1705  . dialysis replacement fluid (prismasate) 300 mL/hr at 10/22/16 1757  . dialysis replacement fluid (prismasate) 200 mL/hr at 10/22/16 2355  . dialysate (PRISMASATE) 1,000 mL/hr at 10/23/16 0600   . aspirin  81 mg Per Tube Daily  . chlorhexidine gluconate (MEDLINE KIT)  15 mL Mouth Rinse BID  . Chlorhexidine Gluconate Cloth  6 each Topical Q0600  . feeding supplement (PRO-STAT SUGAR FREE 64)  30 mL Per Tube TID  . folic acid  1 mg Per Tube Daily  . Gerhardt's butt cream   Topical BID  . guaiFENesin  1,200 mg Oral BID  . insulin aspart  0-24 Units Subcutaneous Q4H  . insulin detemir  20 Units Subcutaneous BID  . levothyroxine  25 mcg Per Tube QAC breakfast  . mouth rinse  15 mL Mouth Rinse q12n4p  . moving right along book   Does not apply Once  . multivitamin  15 mL Per Tube Daily  . pantoprazole sodium  40 mg Per Tube Daily  . patient's guide to using coumadin book   Does not apply Once  . sodium chloride flush  10-40 mL Intracatheter Q12H  . sodium chloride flush  3 mL Intravenous Q12H  . sodium chloride flush  3 mL Intravenous Q12H  . warfarin  2 mg Per Tube q1800  . Warfarin - Physician Dosing Inpatient   Does not apply q1800   sodium chloride, fentaNYL (SUBLIMAZE) injection, fentaNYL (SUBLIMAZE) injection, heparin, heparin, heparin, midazolam, ondansetron (ZOFRAN) IV, RESOURCE THICKENUP CLEAR, sodium chloride flush, sodium chloride flush, sodium chloride flush  Assessment/ Plan:  1. AKI on CKD3/4- post CABG and MAZE procedure. Steady rise in creatinine, no response to ceiling dose diuretics. Hemodynamically driven (low blood pressures) AKI in kidneys with little reserve.  1. Left IJ vas cath by CCM (10/3) 2. CRRT initiated 10/15/16 tolerating fine - FEEL TRANSITION TO IHD APPROPRIATE 10/13 3. Phosphorus repleted 19mq daily    2. S/P CABG/MAZE procedure - ECHO 10/2; no pericardial effusion. Anticoagulate with coumadin 3. Acute hypoxic  resp failure. Pulm edema. Poss HCAP. Oxygenation improving and patient extubated successfully 10/8 4. Shock - Improved and off pressors  1. Sig hypotension 10/4 associated with spontaneous PTX.  2. CCM worried about brewing sepsis. (ATB's (v/z) for possible HCAP) rocephin stopped and white count noted patient is afebrile 5. Anemia- ABLA. No prior significant CKD related anemia (had no outpt ESA requirement) 1. Transfuse for Hb <7 (transfused 9/27) 2. Dosed Aranesp 150 10/2, then will get weekly.  3. Feraheme dosed 10/3, redose 10/10. (tsat 6%) 6. Diabetes with neuropathy 1. Stopped gabapentin (early side effects from accumulation) 7. Hyponatremia - fluid overload. Resolved with CRRT 8. Metabolic acidosis - resolved.  9. Hypokalemia will follow plan intermittent dialysis 10/13   Looks to be doing well although not really that hopeful that he will avoid dialysis after CRRT due to poor renal function at baseline although will continue to assess for recovery   Transition to intermittent dialysis if all is well 10/13    LOS: 15 Keval Nam W '@TODAY''@8'$ :54 AM

## 2016-10-24 NOTE — Progress Notes (Signed)
  Speech Language Pathology Treatment: Dysphagia  Patient Details Name: [name] MRN: [id number] DOB: August 13, 1930 Today's Date: 10/24/2016 Time: 0911-0922 SLP Time Calculation (min) (ACUTE ONLY): 11 min  Assessment / Plan / Recommendation Clinical Impression  Pt seen with pudding following FEES yesterday. Educated/reviewed assessment results with pt/daughter/wife including clinical reasoning for recommendations. Pt self fed with swift oral manipulation and transit and no cough, throat clear or wet vocal quality. Will need to educate/demonstrate thickening liquids to pudding consistency (unable to do so this session). Encouraged pt that he is progressing toward goals and he will be able to have liquids once again in the future.    HPI HPI: Patient is an 83 year old male with history of hypertension, COPD, GERD, pna, chronic diastolic congestive heart failure, stage IV chronic kidney disease, hyperlipidemia, recurrent paroxysmal atrial fibrillation, bronchiectasis, asthma, rheumatoid arthritis, type 2 diabetes mellitus, and anemia. Underwent CABG 9/27. Intubated 9/27-9/28, 10/3-10/8. CXR RIGHT pigtail catheter no longer seen. No pneumothorax. Slight improvement in pulmonary edema.      SLP Plan  Continue with current plan of care       Recommendations  Diet recommendations: Dysphagia 1 (puree);Pudding-thick liquid Liquids provided via: Teaspoon Medication Administration: Crushed with puree Supervision: Patient able to self feed;Full supervision/cueing for compensatory strategies Compensations: Minimize environmental distractions;Slow rate;Small sips/bites;Multiple dry swallows after each bite/sip Postural Changes and/or Swallow Maneuvers: Seated upright 90 degrees                Oral Care Recommendations: Oral care BID Follow up Recommendations:  (TBD) SLP Visit Diagnosis: Dysphagia, pharyngeal phase (R13.13) Plan: Continue with current plan of care       GO                 [name] 10/24/2016, 10:50 AM   [name] Colvin Caroli.[employer] [phone]

## 2016-10-24 NOTE — Significant Event (Signed)
Called to Dr. Justin Mend regarding potassium level 3.0 and notable multiple PVCs. Per MD, to replaced with 3 runs of K. RN to enter this order in per MD.    Birdena Crandall, [name]

## 2016-10-24 NOTE — Progress Notes (Signed)
[address]       [place] [address]             [phone]        CARDIOTHORACIC SURGERY PROGRESS NOTE   R15 Days Post-Op Procedure(s) (LRB): CORONARY ARTERY BYPASS GRAFTING (CABG) x two, using left internal mammary artery and right leg greater saphenous vein harvested endoscopically (N/A) MAZE (N/A) TRANSESOPHAGEAL ECHOCARDIOGRAM (TEE) (N/A) CLIPPING OF ATRIAL APPENDAGE  Subjective: Looks good and reports feeling better.  Sitting up in chair.  No pain, SOB.  Still very weak but improving.  Objective: Vital signs: BP Readings from Last 1 Encounters:  10/24/16 124/63   Pulse Readings from Last 1 Encounters:  10/24/16 99   Resp Readings from Last 1 Encounters:  10/24/16 (!) 27   Temp Readings from Last 1 Encounters:  10/24/16 98.9 F (37.2 C) (Oral)    Hemodynamics:    Physical Exam:  Rhythm:   sinus  Breath sounds: clear  Heart sounds:  RRR  Incisions:  Clean and dry  Abdomen:  Soft, non-distended, non-tender  Extremities:  Warm, well-perfused, ankles swollen but otherwise looks dry   Intake/Output from previous day: 10/11 0701 - 10/12 0700 In: 2397.7 [P.O.:120; I.V.:550.3; NG/GT:1219.3; IV Piggyback:458] Out: 7622 [Stool:1000] Intake/Output this shift: No intake/output data recorded.  Lab Results:  CBC: Recent Labs  10/23/16 0441 10/23/16 1526 10/24/16 0725  WBC 13.9*  --  19.7*  HGB 8.9* 9.2* 8.4*  HCT 28.0* 27.0* 26.5*  PLT 193  --  198    BMET:  Recent Labs  10/23/16 1552 10/24/16 0725  NA 139 142  K 3.4* 3.3*  CL 103 109  CO2 23 21*  GLUCOSE 147* 188*  BUN 57* 75*  CREATININE 2.36* 3.20*  CALCIUM 8.1* 8.1*     PT/INR:   Recent Labs  10/24/16 0504  LABPROT 18.0*  INR 1.51    CBG (last 3)   Recent Labs  10/24/16 0000 10/24/16 0453 10/24/16 0759  GLUCAP 263* 140* 196*    ABG    Component Value Date/Time   PHART 7.417 10/20/2016 0337   PCO2ART 42.7 10/20/2016 0337   PO2ART 76.0 (L)  10/20/2016 0337   HCO3 27.5 10/20/2016 0337   TCO2 23 10/23/2016 1526   ACIDBASEDEF 1.0 10/17/2016 0007   O2SAT 71.5 10/20/2016 0345    CXR: n/a  Assessment/Plan: S/P Procedure(s) (LRB): CORONARY ARTERY BYPASS GRAFTING (CABG) x two, using left internal mammary artery and right leg greater saphenous vein harvested endoscopically (N/A) MAZE (N/A) TRANSESOPHAGEAL ECHOCARDIOGRAM (TEE) (N/A) CLIPPING OF ATRIAL APPENDAGE  Overall stable, slowly improving Maintaining NSR w/ stable BP Breathing comfortably w/ O2 sats 94-97% on RA Postop acute oliguric renal failure w/ preexisting CKD stage IV - CVVHD on hold 24 hours and BUN/Cr rising, patient remains oliguric - does not look volume overloaded at present and electrolytes okay Chronic paroxysmal Afib w/ postop Afib - currently maintaining NSR on IV amiodarone Post op ileus resolved - now with liquid diarrhea - improving and abdominal exam stable Possible HCAP on IV Rocephin, respiratory failure has resolved, afebrile but WBC up 19k today Expected post op acute blood loss anemia, Hgb down slightly 8.4 Severe physical deconditioning and generalize weakness - all acute postop op - improving Essential hypertension w/ acute on chronic diastolic CHF  Type II diabetes w/ complications, CBG's controlled   Management of renal failure per Nephrology team  Will try to convert amiodarone to oral  Continue Coumadin  Low  dose Lovenox unit INR 2.0  Continue tube feeds for now and advance diet as tolerated  PT  Mobilize  Watch WBC, continue Rocephin for now  Rexene Alberts, MD 10/24/2016 9:52 AM

## 2016-10-25 LAB — CBC
HCT: 25 % — ABNORMAL LOW (ref 39.0–52.0)
HEMOGLOBIN: 7.9 g/dL — AB (ref 13.0–17.0)
MCH: 29.7 pg (ref 26.0–34.0)
MCHC: 31.6 g/dL (ref 30.0–36.0)
MCV: 94 fL (ref 78.0–100.0)
Platelets: 175 10*3/uL (ref 150–400)
RBC: 2.66 MIL/uL — ABNORMAL LOW (ref 4.22–5.81)
RDW: 19.5 % — ABNORMAL HIGH (ref 11.5–15.5)
WBC: 12.8 10*3/uL — ABNORMAL HIGH (ref 4.0–10.5)

## 2016-10-25 LAB — RENAL FUNCTION PANEL
ANION GAP: 9 (ref 5–15)
Albumin: 2 g/dL — ABNORMAL LOW (ref 3.5–5.0)
BUN: 99 mg/dL — ABNORMAL HIGH (ref 6–20)
CALCIUM: 7.7 mg/dL — AB (ref 8.9–10.3)
CHLORIDE: 105 mmol/L (ref 101–111)
CO2: 23 mmol/L (ref 22–32)
CREATININE: 4.01 mg/dL — AB (ref 0.61–1.24)
GFR calc Af Amer: 14 mL/min — ABNORMAL LOW (ref >60)
GFR calc non Af Amer: 12 mL/min — ABNORMAL LOW (ref >60)
GLUCOSE: 186 mg/dL — AB (ref 65–99)
Phosphorus: 2.8 mg/dL (ref 2.5–4.6)
Potassium: 3.1 mmol/L — ABNORMAL LOW (ref 3.5–5.1)
SODIUM: 137 mmol/L (ref 135–145)

## 2016-10-25 LAB — GLUCOSE, CAPILLARY
GLUCOSE-CAPILLARY: 327 mg/dL — AB (ref 65–99)
GLUCOSE-CAPILLARY: 129 mg/dL — AB (ref 65–99)
Glucose-Capillary: 224 mg/dL — ABNORMAL HIGH (ref 65–99)
Glucose-Capillary: 247 mg/dL — ABNORMAL HIGH (ref 65–99)
Glucose-Capillary: 302 mg/dL — ABNORMAL HIGH (ref 65–99)
Glucose-Capillary: 209 mg/dL — ABNORMAL HIGH (ref 65–99)

## 2016-10-25 LAB — PROTIME-INR
INR: 1.4
PROTHROMBIN TIME: 17.1 s — AB (ref 11.4–15.2)

## 2016-10-25 LAB — MAGNESIUM: Magnesium: 2.6 mg/dL — ABNORMAL HIGH (ref 1.7–2.4)

## 2016-10-25 LAB — PHOSPHORUS: Phosphorus: 3.1 mg/dL (ref 2.5–4.6)

## 2016-10-25 LAB — APTT: APTT: 30 s (ref 24–36)

## 2016-10-25 MED ORDER — SODIUM CHLORIDE 0.9 % IV SOLN: 100.0000 mL | INTRAVENOUS | Status: DC | PRN

## 2016-10-25 MED ORDER — LIDOCAINE HCL (PF) 1 % IJ SOLN: 5.0000 mL | INTRAMUSCULAR | Status: DC | PRN

## 2016-10-25 MED ORDER — HEPARIN SODIUM (PORCINE) 1000 UNIT/ML DIALYSIS: 1000.0000 [IU] | INTRAMUSCULAR | Status: DC | PRN

## 2016-10-25 MED ORDER — LIDOCAINE-PRILOCAINE 2.5-2.5 % EX CREA: 1.0000 "application " | TOPICAL_CREAM | CUTANEOUS | Status: DC | PRN

## 2016-10-25 MED ORDER — ALTEPLASE 2 MG IJ SOLR: 2.0000 mg | Freq: Once | INTRAMUSCULAR | Status: DC | PRN

## 2016-10-25 MED ORDER — PENTAFLUOROPROP-TETRAFLUOROETH EX AERO: 1.0000 "application " | INHALATION_SPRAY | CUTANEOUS | Status: DC | PRN

## 2016-10-25 NOTE — Progress Notes (Signed)
16 Days Post-Op Procedure(s) (LRB): CORONARY ARTERY BYPASS GRAFTING (CABG) x two, using left internal mammary artery and right leg greater saphenous vein harvested endoscopically (N/A) MAZE (N/A) TRANSESOPHAGEAL ECHOCARDIOGRAM (TEE) (N/A) CLIPPING OF ATRIAL APPENDAGE Subjective: Wants ice chips  Objective: Vital signs in last 24 hours: Temp:  [97.5 F (36.4 C)-98.7 F (37.1 C)] 98.3 F (36.8 C) (10/13 0850) Pulse Rate:  [90-105] 98 (10/13 0900) Cardiac Rhythm: Normal sinus rhythm (10/13 0800) Resp:  [15-32] 17 (10/13 0900) BP: (102-132)/(44-76) 116/50 (10/13 0900) SpO2:  [93 %-100 %] 99 % (10/13 0900) Weight:  [208 lb 15.9 oz (94.8 kg)] 208 lb 15.9 oz (94.8 kg) (10/13 0433)  Hemodynamic parameters for last 24 hours:    Intake/Output from previous day: 10/12 0701 - 10/13 0700 In: 1770.2 [P.O.:180; I.V.:100.2; NG/GT:1290; IV Piggyback:200] Out: 0  Intake/Output this shift: Total I/O In: 100 [NG/GT:100] Out: -   General appearance: alert, cooperative and no distress Neurologic: generalized weakness Heart: regular rate and rhythm Lungs: diminished breath sounds bibasilar Abdomen: normal findings: soft, non-tender  Lab Results:  Recent Labs  10/23/16 0441  10/24/16 0725 10/24/16 1549  WBC 13.9*  --  19.7*  --   HGB 8.9*  < > 8.4* 16.3  HCT 28.0*  < > 26.5* 48.0  PLT 193  --  198  --   < > = values in this interval not displayed. BMET:  Recent Labs  10/24/16 0725 10/24/16 1549 10/24/16 1600  NA 142 144 142  K 3.3* 3.0* 3.0*  CL 109 107 108  CO2 21*  --  23  GLUCOSE 188* 131* 130*  BUN 75* 70* 83*  CREATININE 3.20* 3.40* 3.47*  CALCIUM 8.1*  --  8.0*    PT/INR:  Recent Labs  10/25/16 0428  LABPROT 17.1*  INR 1.40   ABG    Component Value Date/Time   PHART 7.417 10/20/2016 0337   HCO3 27.5 10/20/2016 0337   TCO2 23 10/24/2016 1549   ACIDBASEDEF 1.0 10/17/2016 0007   O2SAT 71.5 10/20/2016 0345   CBG (last 3)   Recent Labs  10/24/16 2338  10/25/16 0341 10/25/16 0847  GLUCAP 141* 224* 247*    Assessment/Plan: S/P Procedure(s) (LRB): CORONARY ARTERY BYPASS GRAFTING (CABG) x two, using left internal mammary artery and right leg greater saphenous vein harvested endoscopically (N/A) MAZE (N/A) TRANSESOPHAGEAL ECHOCARDIOGRAM (TEE) (N/A) CLIPPING OF ATRIAL APPENDAGE -CV- stable in SR  RESP- goos oxygenation on RA  RENAL- oliguric renal failure- for HD today  ENDO- CBG elevated this AM  Deconditioning - mobilize as tolerated   LOS: 16 days    [name] 10/25/2016

## 2016-10-25 NOTE — Progress Notes (Signed)
Per Dr. Skipper Cliche, patient OK to travel to [place] for dialysis. [place], Ardeth Sportsman

## 2016-10-25 NOTE — Plan of Care (Signed)
Problem: Safety: Goal: Ability to remain free from injury will improve Outcome: Progressing Patient remains free from injury while ambulating from chair to bed  Problem: Pain Managment: Goal: General experience of comfort will improve Outcome: Progressing Patient denies pain  Problem: Skin Integrity: Goal: Risk for impaired skin integrity will decrease Outcome: Not Progressing Patient still having skin issues at this time, but they are being treated.

## 2016-10-25 NOTE — Progress Notes (Signed)
[facility] ROUNDING NOTE   Subjective:   Interval History: 83 y/o male underwent a CABG and MAZE on 9/27. Developed worsening renal failure post operatively requiring dialysis, needed intubation for catheter placement. On 10/3 he developed shock in the setting of a spontaneous R sided pneumothorax. Acute on chronic renal disease stage 4. Baseline creatinine 2.6 Followed by Dr Marval Regal Now on CRRT for volume overload. Patient with persistent hypotension 10/3patient appears to be slowly making progress and pressors have been stopped - still no urine output   Will need intermittent dialysis initiated 10/13  Complains of thirst this morning   Objective:  Vital signs in last 24 hours:  Temp:  [97.5 F (36.4 C)-98.7 F (37.1 C)] 97.5 F (36.4 C) (10/13 0755) Pulse Rate:  [90-108] 97 (10/13 0700) Resp:  [15-32] 16 (10/13 0700) BP: (102-132)/(44-76) 111/57 (10/13 0500) SpO2:  [93 %-100 %] 96 % (10/13 0700) Weight:  [208 lb 15.9 oz (94.8 kg)] 208 lb 15.9 oz (94.8 kg) (10/13 0433)  Weight change: 2 lb 4.7 oz (1.042 kg) Filed Weights   10/23/16 0500 10/24/16 0500 10/25/16 0433  Weight: 213 lb 13.5 oz (97 kg) 206 lb 11.2 oz (93.8 kg) 208 lb 15.9 oz (94.8 kg)    Intake/Output: I/O last 3 completed shifts: In: 2420.6 [P.O.:180; I.V.:300.6; NG/GT:1740; IV Piggyback:200] Out: 800 [Stool:800]   Intake/Output this shift:  No intake/output data recorded.  CVS- RRR RS- CTA ABD- BS present soft non-distended EXT- 1 + edema   Basic Metabolic Panel:  Recent Labs Lab 10/21/16 0303  10/22/16 0315 10/22/16 1512  10/23/16 0441 10/23/16 1526 10/23/16 1552 10/24/16 0504 10/24/16 0725 10/24/16 1549 10/24/16 1600 10/25/16 0428  NA 135  < > 139 138  < > 139 140 139  --  142 144 142  --   K 5.9*  < > 3.3* 3.5  < > 3.4* 3.4* 3.4*  --  3.3* 3.0* 3.0*  --   CL 101  < > 102 104  < > 103 101 103  --  109 107 108  --   CO2 26  < > 27 26  --  24  --  23  --  21*  --   23  --   GLUCOSE 130*  < > 165* 158*  < > 197* 141* 147*  --  188* 131* 130*  --   BUN 31*  < > 38* 37*  < > 45* 49* 57*  --  75* 70* 83*  --   CREATININE 1.69*  < > 1.76* 1.76*  < > 1.96* 2.30* 2.36*  --  3.20* 3.40* 3.47*  --   CALCIUM 8.0*  < > 7.8* 7.7*  --  8.1*  --  8.1*  --  8.1*  --  8.0*  --   MG 2.5*  --  2.2  --   --  2.3  --   --  2.4  --   --   --  2.6*  PHOS 2.7  < > 2.0* 1.1*  --  1.6*  --  2.1* 2.5  --   --  2.2* 3.1  < > = values in this interval not displayed.  Liver Function Tests:  Recent Labs Lab 10/19/16 0426  10/20/16 0328  10/21/16 0303  10/22/16 0315 10/22/16 1512 10/23/16 0441 10/23/16 1552 10/24/16 1600  AST 406*  --  317*  --  201*  --  76*  --  54*  --   --  ALT 380*  --  375*  --  312*  --  194*  --  143*  --   --   ALKPHOS 328*  --  366*  --  326*  --  270*  --  287*  --   --   BILITOT 1.4*  --  1.0  --  2.2*  --  0.9  --  1.1  --   --   PROT 5.1*  --  5.2*  --  5.3*  --  4.9*  --  5.3*  --   --   ALBUMIN 1.9*  < > 1.9*  < > 2.1*  < > 1.9* 2.0* 2.1* 2.2* 2.0*  < > = values in this interval not displayed. No results for input(s): LIPASE, AMYLASE in the last 168 hours. No results for input(s): AMMONIA in the last 168 hours.  CBC:  Recent Labs Lab 10/19/16 0426  10/20/16 0328  10/21/16 0303  10/22/16 0315 10/22/16 1526 10/23/16 0441 10/23/16 1526 10/24/16 0725 10/24/16 1549  WBC 20.3*  --  21.5*  --  18.9*  --  15.5*  --  13.9*  --  19.7*  --   NEUTROABS 17.4*  --  18.5*  --  16.1*  --   --   --   --   --   --   --   HGB 8.8*  < > 9.2*  < > 9.1*  < > 8.4* 9.2* 8.9* 9.2* 8.4* 16.3  HCT 26.4*  < > 27.9*  < > 28.3*  < > 26.2* 27.0* 28.0* 27.0* 26.5* 48.0  MCV 86.0  --  88.6  --  89.8  --  90.7  --  91.8  --  93.3  --   PLT 184  --  200  --  185  --  156  --  193  --  198  --   < > = values in this interval not displayed.  Cardiac Enzymes: No results for input(s): CKTOTAL, CKMB, CKMBINDEX, TROPONINI in the last 168 hours.  BNP: Invalid  input(s): POCBNP  CBG:  Recent Labs Lab 10/24/16 0759 10/24/16 1121 10/24/16 1951 10/24/16 2338 10/25/16 0341  GLUCAP 196* 338* 78 141* 224*    Microbiology: Results for orders placed or performed during the hospital encounter of 10/09/16  Culture, respiratory (NON-Expectorated)     Status: None   Collection Time: 10/16/16  8:43 AM  Result Value Ref Range Status   Specimen Description TRACHEAL ASPIRATE  Final   Special Requests NONE  Final   Gram Stain   Final    ABUNDANT WBC PRESENT, PREDOMINANTLY PMN NO ORGANISMS SEEN    Culture RARE ENTEROBACTER CLOACAE  Final   Report Status 10/19/2016 FINAL  Final   Organism ID, Bacteria ENTEROBACTER CLOACAE  Final      Susceptibility   Enterobacter cloacae - MIC*    CEFAZOLIN >=64 RESISTANT Resistant     CEFEPIME <=1 SENSITIVE Sensitive     CEFTAZIDIME <=1 SENSITIVE Sensitive     CEFTRIAXONE <=1 SENSITIVE Sensitive     CIPROFLOXACIN <=0.25 SENSITIVE Sensitive     GENTAMICIN <=1 SENSITIVE Sensitive     IMIPENEM 0.5 SENSITIVE Sensitive     TRIMETH/SULFA <=20 SENSITIVE Sensitive     PIP/TAZO <=4 SENSITIVE Sensitive     * RARE ENTEROBACTER CLOACAE  C difficile quick scan w PCR reflex     Status: None   Collection Time: 10/21/16  5:54 PM  Result Value Ref Range Status  C Diff antigen NEGATIVE NEGATIVE Final   C Diff toxin NEGATIVE NEGATIVE Final   C Diff interpretation No C. difficile detected.  Final    Coagulation Studies:  Recent Labs  10/23/16 0441 10/24/16 0504 10/25/16 0428  LABPROT 17.3* 18.0* 17.1*  INR 1.43 1.51 1.40    Urinalysis: No results for input(s): COLORURINE, LABSPEC, PHURINE, GLUCOSEU, HGBUR, BILIRUBINUR, KETONESUR, PROTEINUR, UROBILINOGEN, NITRITE, LEUKOCYTESUR in the last 72 hours.  Invalid input(s): APPERANCEUR    Imaging: No results found.   Medications:   . sodium chloride    . sodium chloride    . cefTRIAXone (ROCEPHIN)  IV Stopped (10/24/16 1757)  . feeding supplement (OSMOLITE 1.5  CAL) 1,000 mL (10/24/16 1900)  . heparin 10,000 units/ 20 mL infusion syringe 950 Units/hr (10/23/16 0125)  . heparin 999 mL/hr at 10/21/16 1705  . dialysis replacement fluid (prismasate) 300 mL/hr at 10/22/16 1757  . dialysis replacement fluid (prismasate) 200 mL/hr at 10/22/16 2355  . dialysate (PRISMASATE) 1,000 mL/hr at 10/23/16 0600   . amiodarone  400 mg Per Tube Q12H  . aspirin  81 mg Per Tube Daily  . chlorhexidine gluconate (MEDLINE KIT)  15 mL Mouth Rinse BID  . Chlorhexidine Gluconate Cloth  6 each Topical Q0600  . enoxaparin (LOVENOX) injection  30 mg Subcutaneous Q48H  . feeding supplement (PRO-STAT SUGAR FREE 64)  30 mL Per Tube TID  . folic acid  1 mg Per Tube Daily  . Gerhardt's butt cream   Topical BID  . guaiFENesin  1,200 mg Oral BID  . insulin aspart  0-24 Units Subcutaneous Q4H  . insulin detemir  20 Units Subcutaneous BID  . levothyroxine  25 mcg Per Tube QAC breakfast  . mouth rinse  15 mL Mouth Rinse q12n4p  . moving right along book   Does not apply Once  . multivitamin  15 mL Per Tube Daily  . pantoprazole sodium  40 mg Per Tube Daily  . patient's guide to using coumadin book   Does not apply Once  . sodium chloride flush  10-40 mL Intracatheter Q12H  . sodium chloride flush  3 mL Intravenous Q12H  . sodium chloride flush  3 mL Intravenous Q12H  . warfarin  2 mg Per Tube q1800  . Warfarin - Physician Dosing Inpatient   Does not apply q1800   sodium chloride, fentaNYL (SUBLIMAZE) injection, fentaNYL (SUBLIMAZE) injection, heparin, heparin, heparin, midazolam, ondansetron (ZOFRAN) IV, RESOURCE THICKENUP CLEAR, sodium chloride flush, sodium chloride flush, sodium chloride flush  Assessment/ Plan:  1. AKI on CKD3/4- post CABG and MAZE procedure. Steady rise in creatinine, no response to ceiling dose diuretics. Hemodynamically driven (low blood pressures) AKI in kidneys with little reserve.  1. Left IJ vas cath by CCM (10/3) 2. CRRT initiated 10/15/16  tolerating fine - FEEL TRANSITION TO IHD APPROPRIATE 10/13 3. Phosphorus repleted     2. S/P CABG/MAZE procedure - ECHO 10/2; no pericardial effusion. Anticoagulate with coumadin 3. Acute hypoxic resp failure. Pulm edema. Poss HCAP. Oxygenation improving and patient extubated successfully 10/8 4. Shock - Improved and off pressors  1. Sig hypotension 10/4 associated with spontaneous PTX.  2. CCM worried about brewing sepsis. (ATB's (v/z) for possible HCAP)  Restarted Rocephin 5. Anemia- ABLA. No prior significant CKD related anemia (had no outpt ESA requirement)   1. Transfuse for Hb <7 (transfused 9/27) 2. Dosed Aranesp 150 10/2,  3. Feraheme dosed 10/3, redose 10/10. (tsat 6%) 6. Diabetes with neuropathy 1. Stopped gabapentin (early side  effects from accumulation) 7. Hyponatremia - fluid overload. Resolved with CRRT 8. Metabolic acidosis - resolved.  9. Hypokalemia will follow plan intermittent dialysis 10/13   Looks to be doing well although not really that hopeful that he will avoid dialysis after CRRT due to poor renal function at baseline although will continue to assess for recovery   Still no urine output and will continue intermittent dialysis for now Left IJ catheter placed 10/3   LOS: 16 Zenita Kister W @TODAY @8 :40 AM

## 2016-10-26 LAB — GLUCOSE, CAPILLARY
GLUCOSE-CAPILLARY: 173 mg/dL — AB (ref 65–99)
GLUCOSE-CAPILLARY: 211 mg/dL — AB (ref 65–99)
Glucose-Capillary: 110 mg/dL — ABNORMAL HIGH (ref 65–99)
Glucose-Capillary: 73 mg/dL (ref 65–99)
Glucose-Capillary: 157 mg/dL — ABNORMAL HIGH (ref 65–99)

## 2016-10-26 LAB — HEPATITIS B SURFACE ANTIGEN: HEP B S AG: POSITIVE — AB

## 2016-10-26 LAB — POCT I-STAT, CHEM 8
BUN: 56 mg/dL — AB (ref 6–20)
CREATININE: 3.3 mg/dL — AB (ref 0.61–1.24)
Calcium, Ion: 1.07 mmol/L — ABNORMAL LOW (ref 1.15–1.40)
Chloride: 96 mmol/L — ABNORMAL LOW (ref 101–111)
GLUCOSE: 60 mg/dL — AB (ref 65–99)
HCT: 24 % — ABNORMAL LOW (ref 39.0–52.0)
HEMOGLOBIN: 8.2 g/dL — AB (ref 13.0–17.0)
POTASSIUM: 3.1 mmol/L — AB (ref 3.5–5.1)
Sodium: 137 mmol/L (ref 135–145)
TCO2: 28 mmol/L (ref 22–32)

## 2016-10-26 LAB — PROTIME-INR
INR: 1.27
PROTHROMBIN TIME: 15.8 s — AB (ref 11.4–15.2)

## 2016-10-26 LAB — PHOSPHORUS: PHOSPHORUS: 2.4 mg/dL — AB (ref 2.5–4.6)

## 2016-10-26 LAB — MAGNESIUM: Magnesium: 2 mg/dL (ref 1.7–2.4)

## 2016-10-26 LAB — APTT: aPTT: 30 seconds (ref 24–36)

## 2016-10-26 MED ORDER — OSMOLITE 1.5 CAL PO LIQD
1000.0000 mL | ORAL | Status: DC
  Administered 2016-10-26: 1000 mL
  Filled 2016-10-26 (×3): qty 1000

## 2016-10-26 MED ORDER — TRAZODONE HCL 50 MG PO TABS
25.0000 mg | ORAL_TABLET | Freq: Every evening | ORAL | Status: DC | PRN
  Administered 2016-10-26: 25 mg
  Filled 2016-10-26: qty 1

## 2016-10-26 MED ORDER — POTASSIUM CHLORIDE 10 MEQ/50ML IV SOLN
10.0000 meq | INTRAVENOUS | Status: AC
  Administered 2016-10-26 (×4): 10 meq via INTRAVENOUS
  Filled 2016-10-26 (×4): qty 50

## 2016-10-26 NOTE — Anesthesia Postprocedure Evaluation (Signed)
Anesthesia Post Note  Patient: KALIEB FREELAND  Procedure(s) Performed: CORONARY ARTERY BYPASS GRAFTING (CABG) x two, using left internal mammary artery and right leg greater saphenous vein harvested endoscopically (N/A Chest) MAZE (N/A Chest) TRANSESOPHAGEAL ECHOCARDIOGRAM (TEE) (N/A ) CLIPPING OF ATRIAL APPENDAGE (Chest)     Patient location during evaluation: SICU Anesthesia Type: General Level of consciousness: sedated and patient remains intubated per anesthesia plan Pain management: pain level controlled Vital Signs Assessment: post-procedure vital signs reviewed and stable Respiratory status: patient remains intubated per anesthesia plan Cardiovascular status: stable Postop Assessment: no apparent nausea or vomiting Anesthetic complications: no    Last Vitals:  Vitals:   10/26/16 1529 10/26/16 1600  BP:  (!) 111/48  Pulse:  82  Resp:  19  Temp: 37 C   SpO2:  97%    Last Pain:  Vitals:   10/26/16 1600  TempSrc:   PainSc: 0-No pain                 Jairy Angulo,JAMES TERRILL

## 2016-10-26 NOTE — Progress Notes (Signed)
17 Days Post-Op Procedure(s) (LRB): CORONARY ARTERY BYPASS GRAFTING (CABG) x two, using left internal mammary artery and right leg greater saphenous vein harvested endoscopically (N/A) MAZE (N/A) TRANSESOPHAGEAL ECHOCARDIOGRAM (TEE) (N/A) CLIPPING OF ATRIAL APPENDAGE Subjective: No complaints this AM  Objective: Vital signs in last 24 hours: Temp:  [97.6 F (36.4 C)-98.7 F (37.1 C)] 98.7 F (37.1 C) (10/14 0822) Pulse Rate:  [82-99] 89 (10/14 0700) Cardiac Rhythm: Normal sinus rhythm (10/13 2000) Resp:  [14-25] 19 (10/14 0700) BP: (83-127)/(43-78) 83/70 (10/14 0700) SpO2:  [95 %-100 %] 95 % (10/14 0700) Weight:  [207 lb 14.3 oz (94.3 kg)-210 lb 12.2 oz (95.6 kg)] 208 lb 5.4 oz (94.5 kg) (10/14 0402)  Hemodynamic parameters for last 24 hours:    Intake/Output from previous day: 10/13 0701 - 10/14 0700 In: 1620 [P.O.:20; NG/GT:1550; IV Piggyback:50] Out: 1325  Intake/Output this shift: No intake/output data recorded.  General appearance: alert, cooperative and no distress Neurologic: intact Heart: regular rate and rhythm Lungs: diminished breath sounds bibasilar Abdomen: normal findings: soft, non-tender  Lab Results:  Recent Labs  10/24/16 0725 10/24/16 1549 10/25/16 1527  WBC 19.7*  --  12.8*  HGB 8.4* 16.3 7.9*  HCT 26.5* 48.0 25.0*  PLT 198  --  175   BMET:  Recent Labs  10/24/16 1600 10/25/16 1509  NA 142 137  K 3.0* 3.1*  CL 108 105  CO2 23 23  GLUCOSE 130* 186*  BUN 83* 99*  CREATININE 3.47* 4.01*  CALCIUM 8.0* 7.7*    PT/INR:  Recent Labs  10/26/16 0348  LABPROT 15.8*  INR 1.27   ABG    Component Value Date/Time   PHART 7.417 10/20/2016 0337   HCO3 27.5 10/20/2016 0337   TCO2 23 10/24/2016 1549   ACIDBASEDEF 1.0 10/17/2016 0007   O2SAT 71.5 10/20/2016 0345   CBG (last 3)   Recent Labs  10/25/16 2319 10/26/16 0342 10/26/16 0817  GLUCAP 209* 110* 173*    Assessment/Plan: S/P Procedure(s) (LRB): CORONARY ARTERY BYPASS  GRAFTING (CABG) x two, using left internal mammary artery and right leg greater saphenous vein harvested endoscopically (N/A) MAZE (N/A) TRANSESOPHAGEAL ECHOCARDIOGRAM (TEE) (N/A) CLIPPING OF ATRIAL APPENDAGE -  CV- stable in SR on PO amiodarone  On coumadin- INR= 1.27  RESP- continue IS  RENAL- HD yesterday, remains oliguric   ENDO- CBG better controlled  Nutrition- tolerating TF, taking PO OK but poor appetite  Will change TF to 7P to 7A to see PO intake improves  Deconditioning- continue to mobilize as tolerated   LOS: 17 days    Melrose Nakayama 10/26/2016

## 2016-10-26 NOTE — Progress Notes (Addendum)
[name] ROUNDING NOTE   Subjective:    Interval History: 83 y/o male underwent a CABG and MAZE on 9/27. Developed worsening renal failure post operatively requiring dialysis, needed intubation for catheter placement. On 10/3 he developed shock in the setting of a spontaneous R sided pneumothorax. Acute on chronic renal disease stage 4. Baseline creatinine 2.6 Followed by Dr Marval Regal Now on CRRT for volume overload. Patient with persistent hypotension 10/3patient appears to be slowly making progress and pressors have been stopped - still no urine output Will need intermittent dialysis 10/13 next HD 10/15   No complaints this morning   Objective:  Vital signs in last 24 hours:  Temp:  [97.6 F (36.4 C)-98.7 F (37.1 C)] 98.7 F (37.1 C) (10/14 0822) Pulse Rate:  [82-99] 94 (10/14 1000) Resp:  [14-25] 22 (10/14 1000) BP: (83-116)/(43-78) 115/50 (10/14 1000) SpO2:  [94 %-100 %] 98 % (10/14 1000) Weight:  [207 lb 14.3 oz (94.3 kg)-210 lb 12.2 oz (95.6 kg)] 208 lb 5.4 oz (94.5 kg) (10/14 0402)  Weight change: 1 lb 12.2 oz (0.8 kg) Filed Weights   10/25/16 1418 10/25/16 1830 10/26/16 0402  Weight: 210 lb 12.2 oz (95.6 kg) 207 lb 14.3 oz (94.3 kg) 208 lb 5.4 oz (94.5 kg)    Intake/Output: I/O last 3 completed shifts: In: 2430 [P.O.:80; NG/GT:2150; IV Piggyback:200] Out: 1325 [Other:1325]   Intake/Output this shift:  Total I/O In: 290 [NG/GT:290] Out: -   CVS- RRR RS- CTA  Oxygen nasal canula  ABD- BS present soft non-distended EXT- 1 +  edema   Basic Metabolic Panel:  Recent Labs Lab 10/22/16 0315  10/23/16 0441  10/23/16 1552 10/24/16 0504 10/24/16 0725 10/24/16 1549 10/24/16 1600 10/25/16 0428 10/25/16 1509 10/26/16 0348  NA 139  < > 139  < > 139  --  142 144 142  --  137  --   K 3.3*  < > 3.4*  < > 3.4*  --  3.3* 3.0* 3.0*  --  3.1*  --   CL 102  < > 103  < > 103  --  109 107 108  --  105  --   CO2 27  < > 24  --  23  --   21*  --  23  --  23  --   GLUCOSE 165*  < > 197*  < > 147*  --  188* 131* 130*  --  186*  --   BUN 38*  < > 45*  < > 57*  --  75* 70* 83*  --  99*  --   CREATININE 1.76*  < > 1.96*  < > 2.36*  --  3.20* 3.40* 3.47*  --  4.01*  --   CALCIUM 7.8*  < > 8.1*  --  8.1*  --  8.1*  --  8.0*  --  7.7*  --   MG 2.2  --  2.3  --   --  2.4  --   --   --  2.6*  --  2.0  PHOS 2.0*  < > 1.6*  --  2.1* 2.5  --   --  2.2* 3.1 2.8 2.4*  < > = values in this interval not displayed.  Liver Function Tests:  Recent Labs Lab 10/20/16 0328  10/21/16 0303  10/22/16 0315 10/22/16 1512 10/23/16 0441 10/23/16 1552 10/24/16 1600 10/25/16 1509  AST 317*  --  201*  --  76*  --  54*  --   --   --  ALT 375*  --  312*  --  194*  --  143*  --   --   --   ALKPHOS 366*  --  326*  --  270*  --  287*  --   --   --   BILITOT 1.0  --  2.2*  --  0.9  --  1.1  --   --   --   PROT 5.2*  --  5.3*  --  4.9*  --  5.3*  --   --   --   ALBUMIN 1.9*  < > 2.1*  < > 1.9* 2.0* 2.1* 2.2* 2.0* 2.0*  < > = values in this interval not displayed. No results for input(s): LIPASE, AMYLASE in the last 168 hours. No results for input(s): AMMONIA in the last 168 hours.  CBC:  Recent Labs Lab 10/20/16 0328  10/21/16 0303  10/22/16 0315  10/23/16 0441 10/23/16 1526 10/24/16 0725 10/24/16 1549 10/25/16 1527  WBC 21.5*  --  18.9*  --  15.5*  --  13.9*  --  19.7*  --  12.8*  NEUTROABS 18.5*  --  16.1*  --   --   --   --   --   --   --   --   HGB 9.2*  < > 9.1*  < > 8.4*  < > 8.9* 9.2* 8.4* 16.3 7.9*  HCT 27.9*  < > 28.3*  < > 26.2*  < > 28.0* 27.0* 26.5* 48.0 25.0*  MCV 88.6  --  89.8  --  90.7  --  91.8  --  93.3  --  94.0  PLT 200  --  185  --  156  --  193  --  198  --  175  < > = values in this interval not displayed.  Cardiac Enzymes: No results for input(s): CKTOTAL, CKMB, CKMBINDEX, TROPONINI in the last 168 hours.  BNP: Invalid input(s): POCBNP  CBG:  Recent Labs Lab 10/25/16 1212 10/25/16 1925 10/25/16 2319  10/26/16 0342 10/26/16 0817  GLUCAP 302* 129* 209* 110* 173*    Microbiology: Results for orders placed or performed during the hospital encounter of 10/09/16  Culture, respiratory (NON-Expectorated)     Status: None   Collection Time: 10/16/16  8:43 AM  Result Value Ref Range Status   Specimen Description TRACHEAL ASPIRATE  Final   Special Requests NONE  Final   Gram Stain   Final    ABUNDANT WBC PRESENT, PREDOMINANTLY PMN NO ORGANISMS SEEN    Culture RARE ENTEROBACTER CLOACAE  Final   Report Status 10/19/2016 FINAL  Final   Organism ID, Bacteria ENTEROBACTER CLOACAE  Final      Susceptibility   Enterobacter cloacae - MIC*    CEFAZOLIN >=64 RESISTANT Resistant     CEFEPIME <=1 SENSITIVE Sensitive     CEFTAZIDIME <=1 SENSITIVE Sensitive     CEFTRIAXONE <=1 SENSITIVE Sensitive     CIPROFLOXACIN <=0.25 SENSITIVE Sensitive     GENTAMICIN <=1 SENSITIVE Sensitive     IMIPENEM 0.5 SENSITIVE Sensitive     TRIMETH/SULFA <=20 SENSITIVE Sensitive     PIP/TAZO <=4 SENSITIVE Sensitive     * RARE ENTEROBACTER CLOACAE  C difficile quick scan w PCR reflex     Status: None   Collection Time: 10/21/16  5:54 PM  Result Value Ref Range Status   C Diff antigen NEGATIVE NEGATIVE Final   C Diff toxin NEGATIVE NEGATIVE Final   C Diff interpretation No  C. difficile detected.  Final    Coagulation Studies:  Recent Labs  10/24/16 0504 10/25/16 0428 10/26/16 0348  LABPROT 18.0* 17.1* 15.8*  INR 1.51 1.40 1.27    Urinalysis: No results for input(s): COLORURINE, LABSPEC, PHURINE, GLUCOSEU, HGBUR, BILIRUBINUR, KETONESUR, PROTEINUR, UROBILINOGEN, NITRITE, LEUKOCYTESUR in the last 72 hours.  Invalid input(s): APPERANCEUR    Imaging: No results found.   Medications:   . sodium chloride    . sodium chloride    . cefTRIAXone (ROCEPHIN)  IV Stopped (10/25/16 1941)  . feeding supplement (OSMOLITE 1.5 CAL)    . heparin 999 mL/hr at 10/21/16 1705   . amiodarone  400 mg Per Tube Q12H  .  aspirin  81 mg Per Tube Daily  . chlorhexidine gluconate (MEDLINE KIT)  15 mL Mouth Rinse BID  . Chlorhexidine Gluconate Cloth  6 each Topical Q0600  . enoxaparin (LOVENOX) injection  30 mg Subcutaneous Q48H  . feeding supplement (PRO-STAT SUGAR FREE 64)  30 mL Per Tube TID  . folic acid  1 mg Per Tube Daily  . Gerhardt's butt cream   Topical BID  . guaiFENesin  1,200 mg Oral BID  . insulin aspart  0-24 Units Subcutaneous Q4H  . insulin detemir  20 Units Subcutaneous BID  . levothyroxine  25 mcg Per Tube QAC breakfast  . mouth rinse  15 mL Mouth Rinse q12n4p  . moving right along book   Does not apply Once  . multivitamin  15 mL Per Tube Daily  . pantoprazole sodium  40 mg Per Tube Daily  . patient's guide to using coumadin book   Does not apply Once  . sodium chloride flush  10-40 mL Intracatheter Q12H  . sodium chloride flush  3 mL Intravenous Q12H  . sodium chloride flush  3 mL Intravenous Q12H  . warfarin  2 mg Per Tube q1800  . Warfarin - Physician Dosing Inpatient   Does not apply q1800   sodium chloride, fentaNYL (SUBLIMAZE) injection, fentaNYL (SUBLIMAZE) injection, heparin, heparin, heparin, midazolam, ondansetron (ZOFRAN) IV, RESOURCE THICKENUP CLEAR, sodium chloride flush, sodium chloride flush, sodium chloride flush  Assessment/ Plan:  1. AKI on CKD3/4- post CABG and MAZE procedure. Steady rise in creatinine, no response to ceiling dose diuretics. Hemodynamically driven (low blood pressures) AKI in kidneys with little reserve.  1. Left IJ vas cath by CCM (10/3) 2. CRRT initiated 10/15/16 tolerating fine - FEEL TRANSITION TO IHD APPROPRIATE 10/13 WILL PLAN DIALYSIS 10/15 3. Phosphorus repleted 2. S/P CABG/MAZE procedure - ECHO 10/2; no pericardial effusion. Anticoagulate with coumadin 3. Acute hypoxic resp failure. Pulm edema. Poss HCAP. Oxygenation improving and patient extubated successfully 10/8 4. Shock - Improved and off pressors  1. Sig hypotension 10/4  associated with spontaneous PTX.  2. CCM worried about brewing sepsis. (ATB's (v/z) for possible HCAP)  Restarted Rocephin 5. Anemia- ABLA. No prior significant CKD related anemia (had no outpt ESA requirement)   1. Transfuse for Hb <7 (transfused 9/27) 2. Dosed Aranesp 150 10/2,  3. Feraheme dosed 10/3, redose 10/10. (tsat 6%) 6. Diabetes with neuropathy 1. Stopped gabapentin (early side effects from accumulation) 7. Hyponatremia - fluid overload. Resolved with CRRT 8. Metabolic acidosis - resolved.  9. Hypokalemia will follow plan intermittent dialysis 10/13   Looks to be doing well although not really that hopeful that he will avoid dialysis after CRRT due to poor renal function at baseline although will continue to assess for recovery   Still no urine output and will  continue intermittent dialysis for now Left IJ catheter placed 10/3   LOS: 53 Ahlam Piscitelli W _0 _1 :09 AM

## 2016-10-26 NOTE — Progress Notes (Signed)
      [address]       [place] [address]             [phone]      Stable day  CBG lower today  BP 113/87   Pulse 85   Temp 98.6 F (37 C) (Oral)   Resp 18   Ht 5\' 8"  (1.727 m)   Wt 208 lb 5.4 oz (94.5 kg)   SpO2 97%   BMI 31.68 kg/m    Intake/Output Summary (Last 24 hours) at 10/26/16 1814 Last data filed at 10/26/16 1704  Gross per 24 hour  Intake             1550 ml  Output             1450 ml  Net              100 ml   Remo Lipps C. Roxan Hockey, MD [facility] [phone]

## 2016-10-26 NOTE — Progress Notes (Signed)
Patient's potassium 3.1 on afternoon I-stat chem 8.  Nephrology notified and 4 IV runs of KCL ordered.  [place], Ardeth Sportsman

## 2016-10-26 NOTE — Plan of Care (Signed)
Problem: Pain Managment: Goal: General experience of comfort will improve Outcome: Progressing Denies pain  Problem: Skin Integrity: Goal: Risk for impaired skin integrity will decrease Outcome: Not Progressing Diarrhea degrading skin at perineum; flexiseal inserted  Problem: Bowel/Gastric: Goal: Will not experience complications related to bowel motility Outcome: Not Progressing Diarrhea

## 2016-10-27 ENCOUNTER — Inpatient Hospital Stay (HOSPITAL_COMMUNITY): Payer: Medicare Other

## 2016-10-27 DIAGNOSIS — J449 Chronic obstructive pulmonary disease, unspecified: Secondary | ICD-10-CM

## 2016-10-27 DIAGNOSIS — Z794 Long term (current) use of insulin: Secondary | ICD-10-CM

## 2016-10-27 DIAGNOSIS — Z01810 Encounter for preprocedural cardiovascular examination: Secondary | ICD-10-CM

## 2016-10-27 DIAGNOSIS — Z951 Presence of aortocoronary bypass graft: Secondary | ICD-10-CM

## 2016-10-27 DIAGNOSIS — Z8679 Personal history of other diseases of the circulatory system: Secondary | ICD-10-CM

## 2016-10-27 DIAGNOSIS — Z9889 Other specified postprocedural states: Secondary | ICD-10-CM

## 2016-10-27 DIAGNOSIS — E0822 Diabetes mellitus due to underlying condition with diabetic chronic kidney disease: Secondary | ICD-10-CM

## 2016-10-27 DIAGNOSIS — N184 Chronic kidney disease, stage 4 (severe): Secondary | ICD-10-CM

## 2016-10-27 DIAGNOSIS — D62 Acute posthemorrhagic anemia: Secondary | ICD-10-CM

## 2016-10-27 DIAGNOSIS — D638 Anemia in other chronic diseases classified elsewhere: Secondary | ICD-10-CM

## 2016-10-27 DIAGNOSIS — M069 Rheumatoid arthritis, unspecified: Secondary | ICD-10-CM

## 2016-10-27 DIAGNOSIS — I10 Essential (primary) hypertension: Secondary | ICD-10-CM

## 2016-10-27 LAB — RENAL FUNCTION PANEL
Albumin: 2.2 g/dL — ABNORMAL LOW (ref 3.5–5.0)
Anion gap: 8 (ref 5–15)
BUN: 20 mg/dL (ref 6–20)
CALCIUM: 7.6 mg/dL — AB (ref 8.9–10.3)
CO2: 29 mmol/L (ref 22–32)
CREATININE: 1.81 mg/dL — AB (ref 0.61–1.24)
Chloride: 99 mmol/L — ABNORMAL LOW (ref 101–111)
GFR calc non Af Amer: 33 mL/min — ABNORMAL LOW (ref >60)
GFR, EST AFRICAN AMERICAN: 38 mL/min — AB (ref >60)
Glucose, Bld: 70 mg/dL (ref 65–99)
PHOSPHORUS: 1.7 mg/dL — AB (ref 2.5–4.6)
Potassium: 3.4 mmol/L — ABNORMAL LOW (ref 3.5–5.1)
Sodium: 136 mmol/L (ref 135–145)

## 2016-10-27 LAB — CBC
HEMATOCRIT: 22.5 % — AB (ref 39.0–52.0)
Hemoglobin: 7.3 g/dL — ABNORMAL LOW (ref 13.0–17.0)
MCH: 30.3 pg (ref 26.0–34.0)
MCHC: 32.4 g/dL (ref 30.0–36.0)
MCV: 93.4 fL (ref 78.0–100.0)
PLATELETS: 193 10*3/uL (ref 150–400)
RBC: 2.41 MIL/uL — AB (ref 4.22–5.81)
RDW: 19.2 % — AB (ref 11.5–15.5)
WBC: 10.5 10*3/uL (ref 4.0–10.5)

## 2016-10-27 LAB — POCT I-STAT, CHEM 8
BUN: 20 mg/dL (ref 6–20)
CREATININE: 1.6 mg/dL — AB (ref 0.61–1.24)
Calcium, Ion: 1.01 mmol/L — ABNORMAL LOW (ref 1.15–1.40)
Chloride: 96 mmol/L — ABNORMAL LOW (ref 101–111)
GLUCOSE: 66 mg/dL (ref 65–99)
HEMATOCRIT: 33 % — AB (ref 39.0–52.0)
Hemoglobin: 11.2 g/dL — ABNORMAL LOW (ref 13.0–17.0)
POTASSIUM: 3.5 mmol/L (ref 3.5–5.1)
Sodium: 137 mmol/L (ref 135–145)
TCO2: 26 mmol/L (ref 22–32)

## 2016-10-27 LAB — GLUCOSE, CAPILLARY
GLUCOSE-CAPILLARY: 138 mg/dL — AB (ref 65–99)
GLUCOSE-CAPILLARY: 145 mg/dL — AB (ref 65–99)
GLUCOSE-CAPILLARY: 212 mg/dL — AB (ref 65–99)
GLUCOSE-CAPILLARY: 85 mg/dL (ref 65–99)
Glucose-Capillary: 94 mg/dL (ref 65–99)
Glucose-Capillary: 76 mg/dL (ref 65–99)

## 2016-10-27 LAB — BASIC METABOLIC PANEL
ANION GAP: 10 (ref 5–15)
BUN: 72 mg/dL — ABNORMAL HIGH (ref 6–20)
CALCIUM: 7.8 mg/dL — AB (ref 8.9–10.3)
CO2: 25 mmol/L (ref 22–32)
Chloride: 100 mmol/L — ABNORMAL LOW (ref 101–111)
Creatinine, Ser: 3.87 mg/dL — ABNORMAL HIGH (ref 0.61–1.24)
GFR, EST AFRICAN AMERICAN: 15 mL/min — AB (ref >60)
GFR, EST NON AFRICAN AMERICAN: 13 mL/min — AB (ref >60)
GLUCOSE: 131 mg/dL — AB (ref 65–99)
POTASSIUM: 3.2 mmol/L — AB (ref 3.5–5.1)
Sodium: 135 mmol/L (ref 135–145)

## 2016-10-27 LAB — PROTIME-INR
INR: 1.22
PROTHROMBIN TIME: 15.3 s — AB (ref 11.4–15.2)

## 2016-10-27 LAB — MAGNESIUM: Magnesium: 2.2 mg/dL (ref 1.7–2.4)

## 2016-10-27 LAB — APTT: aPTT: 32 seconds (ref 24–36)

## 2016-10-27 LAB — PHOSPHORUS: PHOSPHORUS: 3.1 mg/dL (ref 2.5–4.6)

## 2016-10-27 LAB — PREPARE RBC (CROSSMATCH)

## 2016-10-27 MED ORDER — GUAIFENESIN 100 MG/5ML PO SOLN
20.0000 mL | ORAL | Status: DC | PRN
  Administered 2016-10-31 – 2016-11-03 (×5): 400 mg via ORAL
  Filled 2016-10-27: qty 5
  Filled 2016-10-27: qty 15
  Filled 2016-10-27: qty 5
  Filled 2016-10-27 (×4): qty 20
  Filled 2016-10-27: qty 15

## 2016-10-27 MED ORDER — WARFARIN - PHYSICIAN DOSING INPATIENT: Freq: Every day | Status: DC

## 2016-10-27 MED ORDER — SODIUM CHLORIDE 0.9 % IV SOLN: Freq: Once | INTRAVENOUS | Status: AC

## 2016-10-27 MED ORDER — WARFARIN SODIUM 5 MG PO TABS: 5.0000 mg | ORAL_TABLET | Freq: Every day | ORAL | Status: DC

## 2016-10-27 MED ORDER — VITAL 1.5 CAL PO LIQD
1000.0000 mL | ORAL | Status: DC
  Administered 2016-10-27 – 2016-10-30 (×3): 1000 mL
  Filled 2016-10-27 (×6): qty 1000

## 2016-10-27 NOTE — Progress Notes (Signed)
Nutrition Follow-up  DOCUMENTATION CODES:   Obesity unspecified  INTERVENTION:   Magic cup TID with meals, each supplement provides 290 kcal and 9 grams of protein  Continue:  Osmolite 1.5 @ 50 ml/hr (1200 ml/day) 30 ml Prostat TID Provides: 2100 kcal, 120 grams protein, 914 ml free water.    NUTRITION DIAGNOSIS:   Increased nutrient needs related to  (recent surgery) as evidenced by estimated needs. Ongoing.   GOAL:   Patient will meet greater than or equal to 90% of their needs Met.   MONITOR:   PO intake, Diet advancement, TF tolerance, Labs  ASSESSMENT:   Pt with PMH of HTN, CHF, stage IV CKD, hyperlipidemia, afib, COPD, DM admitted for CABG x 2 and MAZE. Post op pt had renal and respiratory failure required intubation and CRRT 10/3.   10/11 Diet advanced to dysphagia 1 with pudding thick liquids Pt does not like foods offered on this diet. The does not like vanilla flavors. Willing to do orange magic cup.   Remains on iHD for now.   Medications reviewed and include: folvite, levemir, MVI Labs reviewed: K+ 3.2 (L) CBG's: [id number]   TF: Osmolite 1.5 @ 50 ml/hr (1200 ml/day) 30 ml Prostat TID Provides: 2100 kcal, 120 grams protein, 914 ml free water.   Diet Order:  DIET - DYS 1 Room service appropriate? Yes; Fluid consistency: Pudding Thick Diet NPO time specified Except for: Sips with Meds  Skin:   (MASD sacrum)  Last BM:  625 ml x 24 hrs via rectal tube  Height:   Ht Readings from Last 1 Encounters:  10/10/16 _0  (1.727 m)    Weight:   Wt Readings from Last 1 Encounters:  10/27/16 208 lb 15.9 oz (94.8 kg)    Ideal Body Weight:  70 kg  BMI:  Body mass index is 31.78 kg/m.  Estimated Nutritional Needs:   Kcal:  1900-2100  Protein:  120-135 grams  Fluid:  1.2 L/day  EDUCATION NEEDS:   No education needs identified at this time  [place], [place], [place] Pager [phone] After Hours Pager

## 2016-10-27 NOTE — Consult Note (Signed)
Physical Medicine and Rehabilitation Consult Reason for Consult:  Decreased functional mobility Referring Physician: Dr. Roxy Manns   HPI: [name] is a 83 y.o.right handed male with history of hypertension, chronic diastolic congestive heart failure, stage IV chronic renal insufficiency, recurrent PAF on long-term anticoagulation, COPD with remote tobacco abuse, rheumatoid arthritis and diabetes mellitus. Per chart review and patient, patient lives with spouse. Independent prior to admission. One level home with ramp entrance. Presented 10/08/2016 with progressive shortness of breath. Noninvasive echocardiogram with ejection fraction of ~45% no signs of ischemia. Elective cardiac catheterization with noted CAD. Underwent CABG/MAZE procedure 10/09/2016 per Dr. Roxy Manns. Hospital course pain management. Nephrology consulted for AKI on CKD. Patient has received intermittent dialysis scheduled again for 10/27/2016 and latest creatinine 3.87. Chronic Coumadin therapy has been resumed. Acute on chronic anemia 7.3 and monitored with transfusion as directed, receiving during encounter. Physical occupational therapy ongoing with slow progressive gains. M.D. Has requested physical medicine rehabilitation consult.   Review of Systems  Constitutional: Negative for chills and fever.  HENT: Negative for hearing loss.   Eyes: Negative for blurred vision and double vision.  Respiratory: Positive for cough and shortness of breath.   Cardiovascular: Positive for chest pain and palpitations.  Gastrointestinal: Positive for constipation. Negative for nausea and vomiting.       GERD  Genitourinary: Negative for flank pain and hematuria.  Musculoskeletal: Positive for joint pain and myalgias.  Skin: Negative for rash.  Neurological: Positive for weakness. Negative for seizures.  All other systems reviewed and are negative.  Past Medical History:  Diagnosis Date  . Anemia   . Asthma   . Atrial fibrillation  ([place]) 12/08/2013  . CHF (congestive heart failure) ([place])   . Chronic joint pain    "from RA" (10/01/2016)  . CKD (chronic kidney disease), stage III   . Constipation    takes stool softener daily  . COPD (chronic obstructive pulmonary disease) ([place])   . Coronary artery disease   . Coronary artery disease involving native coronary artery of native heart with angina pectoris ([place]) 09/30/2016  . Dysrhythmia    a-fib  . GERD (gastroesophageal reflux disease)    takes Zantac daily   . History of blood transfusion    "related to one of my ORs"  . Hyperlipidemia    takes Pravastatin daily  . Hypertension    takes Cardizem,Proscar,and Lisinopril daily  . Hypothyroidism   . Impaired hearing    wears hearing aids  . Joint swelling    "from RA" (10/01/2016)  . Peripheral neuropathy   . Pneumonia 2017  . Rheumatoid arthritis ([place])    "all over; take orencia  q 4 wks" (10/01/2016)  . S/P CABG x 2 10/09/2016   LIMA to LAD, SVG to OM, EVH via right thigh  . S/P Maze operation for atrial fibrillation 10/09/2016   Complete bilateral atrial lesion set using bipolar radiofrequency and cryothermy ablation with clipping of LA appendage  . Type II diabetes mellitus ([place])    Past Surgical History:  Procedure Laterality Date  . AMPUTATION TOE Left 11/08/2015   Procedure: LEFT HALLUX AMPUTATION AT THE METATARSOPHALANGEAL JOINT;  Surgeon: Wylene Simmer, MD;  Location: [facility];  Service: Orthopedics;  Laterality: Left;  . ANKLE FUSION Left 2014  . BIOPSY THYROID  2014  . CARDIAC CATHETERIZATION  09/30/2016  . CARPAL TUNNEL RELEASE Bilateral 1990's  . CATARACT EXTRACTION W/ INTRAOCULAR LENS  IMPLANT, BILATERAL Bilateral 1990's  .  CLIPPING OF ATRIAL APPENDAGE  10/09/2016   Procedure: CLIPPING OF ATRIAL APPENDAGE;  Surgeon: Rexene Alberts, MD;  Location: [facility];  Service: Open Heart Surgery;;  . COLONOSCOPY    . CORONARY ARTERY BYPASS GRAFT N/A 10/09/2016   Procedure: CORONARY ARTERY  BYPASS GRAFTING (CABG) x two, using left internal mammary artery and right leg greater saphenous vein harvested endoscopically;  Surgeon: Rexene Alberts, MD;  Location: [facility];  Service: Open Heart Surgery;  Laterality: N/A;  . FOREIGN BODY REMOVAL Right 09/06/2013   Procedure: REMOVAL FOREIGN BODY RIGHT INDEX FINGER;  Surgeon: Daryll Brod, MD;  Location: [facility];  Service: Orthopedics;  Laterality: Right;  ANESTHESIA: IV REGIONAL FAB  . JOINT REPLACEMENT    . MAZE N/A 10/09/2016   Procedure: MAZE;  Surgeon: Rexene Alberts, MD;  Location: [facility];  Service: Open Heart Surgery;  Laterality: N/A;  . REPAIR TENDONS FOOT Left 1946; 1984   "farming accident; repaired tendons both times"  . RIGHT/LEFT HEART CATH AND CORONARY ANGIOGRAPHY N/A 09/30/2016   Procedure: RIGHT/LEFT HEART CATH AND CORONARY ANGIOGRAPHY;  Surgeon: Nigel Mormon, MD;  Location: [facility];  Service: Cardiovascular;  Laterality: N/A;  . SHOULDER ARTHROSCOPY W/ ROTATOR CUFF REPAIR Right 1990's  . TEE WITHOUT CARDIOVERSION N/A 10/09/2016   Procedure: TRANSESOPHAGEAL ECHOCARDIOGRAM (TEE);  Surgeon: Rexene Alberts, MD;  Location: [facility];  Service: Open Heart Surgery;  Laterality: N/A;  . THYROIDECTOMY Right 02/01/2013   Procedure: RIGHT THYROIDECTOMY LOBECTOMY;  Surgeon: Earnstine Regal, MD;  Location: [facility];  Service: General;  Laterality: Right;  . TONSILLECTOMY  1930's  . TOTAL KNEE ARTHROPLASTY Right 12/16/2010   Procedure: TOTAL KNEE ARTHROPLASTY; rt Surgeon: Rudean Haskell, MD;  Location: [facility];  Service: Orthopedics;  Laterality: Right;  . TOTAL SHOULDER REPLACEMENT Left 03/2010   Family History  Problem Relation Age of Onset  . Cancer Mother        breast  . Aneurysm Father    Social History:  reports that he quit smoking about 51 years ago. His smoking use included Cigarettes. He has a 15.00 pack-year smoking history. He has never used smokeless tobacco. He reports that he does not drink  alcohol or use drugs. Allergies: No Known Allergies Medications Prior to Admission  Medication Sig Dispense Refill  . Abatacept (ORENCIA IV) Inject into the vein every 30 (thirty) days. Infusion due again October 2018    . albuterol (PROVENTIL) (2.5 MG/3ML) 0.083% nebulizer solution Take 3 mLs (2.5 mg total) by nebulization every 6 (six) hours as needed for wheezing or shortness of breath. 360 mL 11  . aspirin 81 MG chewable tablet Chew 1 tablet (81 mg total) by mouth daily.    [name] atorvastatin (LIPITOR) 40 MG tablet Take 40 mg by mouth daily.    . ferrous sulfate 325 (65 FE) MG tablet Take 325 mg by mouth daily with breakfast.     . finasteride (PROSCAR) 5 MG tablet Take 5 mg by mouth at bedtime.     . fluticasone furoate-vilanterol (BREO ELLIPTA) 100-25 MCG/INH AEPB Inhale 1 puff into the lungs daily. 60 each 5  . folic acid (FOLVITE) 1 MG tablet Take 1 mg by mouth daily.      . furosemide (LASIX) 80 MG tablet Take 80 mg by mouth 2 (two) times daily.    [name] gabapentin (NEURONTIN) 300 MG capsule Take 1 capsule (300 mg total) by mouth 2 (two) times daily. 15 capsule 0  .  guaiFENesin (MUCINEX) 600 MG 12 hr tablet Take 1,200 mg by mouth 2 (two) times daily.     [name] HYDROcodone-acetaminophen (NORCO) 5-325 MG per tablet Take 1 tablet by mouth every 6 (six) hours as needed for moderate pain. (Patient taking differently: Take 1 tablet by mouth every 6 (six) hours as needed for moderate pain. [facility]) 30 tablet 0  . Insulin Glargine (TOUJEO SOLOSTAR) 300 UNIT/ML SOPN Inject 14 Units into the skin at bedtime.     . isosorbide-hydrALAZINE (BIDIL) 20-37.5 MG tablet Take 1 tablet by mouth 3 (three) times daily.    [name] leflunomide (ARAVA) 20 MG tablet Take 20 mg by mouth daily.     [name] levothyroxine (SYNTHROID, LEVOTHROID) 25 MCG tablet Take 25 mcg by mouth daily before breakfast.    . metoprolol (LOPRESSOR) 50 MG tablet Take 25 mg by mouth 2 (two) times daily.     . ranitidine (ZANTAC) 150 MG  tablet Take 150 mg by mouth 2 (two) times daily.      . Tamsulosin HCl (FLOMAX) 0.4 MG CAPS Take 0.4 mg by mouth at bedtime.     . traZODone (DESYREL) 50 MG tablet Take 50 mg by mouth at bedtime as needed for sleep.      Home: Home Living Family/patient expects to be discharged to:: Private residence Living Arrangements: Spouse/significant other Available Help at Discharge: Family Type of Home: House Home Access: [place]: One level Bathroom Shower/Tub: Multimedia programmer: Standard Bathroom Accessibility: Yes Home Equipment: Toilet riser, Grab bars - tub/shower, Jenkins in, [employer] - toilet, Lott - single point, Environmental consultant - 4 wheels  Functional History: Prior Function Level of Independence: Independent with assistive device(s) Comments: Pt used rollator or cane PTA.  Has bad ankles per family Functional Status:  Mobility: Bed Mobility General bed mobility comments: OOB in chair on arrival Transfers Overall transfer level: Needs assistance Equipment used:  (EVA walker) Transfers: Sit to/from Stand Sit to Stand: Mod assist, +2 physical assistance General transfer comment: modAx2 for power up, steadying and reaching to EVA walker, 3x sit<>stand Ambulation/Gait Ambulation/Gait assistance: Mod assist Ambulation Distance (Feet): 8 Feet (2x4 feet) Assistive device:  (EVA walker) Gait Pattern/deviations: Step-through pattern, Decreased step length - right, Decreased step length - left, Trunk flexed, Antalgic, Narrow base of support General Gait Details: modA for steadying with gait, vc for encouragement, anterior pelvic tilt, and looking up and out with gait Gait velocity: slowed Gait velocity interpretation: Below normal speed for age/gender    ADL:    Cognition: Cognition Overall Cognitive Status: Impaired/Different from baseline Orientation Level: Oriented X4 Cognition Arousal/Alertness: Awake/alert Behavior During Therapy: Flat  affect Overall Cognitive Status: Impaired/Different from baseline Area of Impairment: Orientation, Attention, Memory, Following commands Orientation Level: Disoriented to, Time Current Attention Level: Alternating Memory: Decreased recall of precautions, Decreased short-term memory Following Commands: Follows multi-step commands consistently Safety/Judgement: Decreased awareness of safety, Decreased awareness of deficits Problem Solving: Slow processing, Requires verbal cues, Requires tactile cues General Comments: pt able to recall 2/3 sternal precautions at end of session today Difficult to assess due to: Tracheostomy (with vent)  Blood pressure (!) 107/47, pulse 84, temperature 97.9 F (36.6 C), resp. rate 15, height 5\' 8"  (1.727 m), weight 93.4 kg (205 lb 14.6 oz), SpO2 98 %. Physical Exam  Vitals reviewed. Constitutional: He appears well-developed.  Frail  HENT:  Head: Normocephalic and atraumatic.  Nasogastric tube in place  Eyes: EOM are normal. Right eye  exhibits no discharge. Left eye exhibits no discharge.  Neck: Normal range of motion. Neck supple. No thyromegaly present.  Cardiovascular: Normal rate and regular rhythm.   Respiratory: Effort normal.  Limited inspiratory effort but clear to auscultation  GI: Soft. Bowel sounds are normal. He exhibits no distension.  Musculoskeletal:  No edema or tenderness in extremities  Neurological: He is alert.  Oriented to person place and date of birth.  Limited medical historian Motor: 4-/5 grossly throughout  Skin:  Surgical chest incision site clean and dry  Psychiatric: He has a normal mood and affect. His behavior is normal.    Results for orders placed or performed during the hospital encounter of 10/09/16 (from the past 24 hour(s))  Glucose, capillary     Status: Abnormal   Collection Time: 10/26/16 12:26 PM  Result Value Ref Range   Glucose-Capillary 211 (H) 65 - 99 mg/dL   Comment 1 Capillary Specimen    Comment 2  Notify RN   Glucose, capillary     Status: None   Collection Time: 10/26/16  3:27 PM  Result Value Ref Range   Glucose-Capillary 73 65 - 99 mg/dL   Comment 1 Capillary Specimen    Comment 2 Notify RN   I-STAT, chem 8     Status: Abnormal   Collection Time: 10/26/16  4:21 PM  Result Value Ref Range   Sodium 137 135 - 145 mmol/L   Potassium 3.1 (L) 3.5 - 5.1 mmol/L   Chloride 96 (L) 101 - 111 mmol/L   BUN 56 (H) 6 - 20 mg/dL   Creatinine, Ser 3.30 (H) 0.61 - 1.24 mg/dL   Glucose, Bld 60 (L) 65 - 99 mg/dL   Calcium, Ion 1.07 (L) 1.15 - 1.40 mmol/L   TCO2 28 22 - 32 mmol/L   Hemoglobin 8.2 (L) 13.0 - 17.0 g/dL   HCT 24.0 (L) 39.0 - 52.0 %  Glucose, capillary     Status: Abnormal   Collection Time: 10/26/16  8:26 PM  Result Value Ref Range   Glucose-Capillary 157 (H) 65 - 99 mg/dL  Glucose, capillary     Status: Abnormal   Collection Time: 10/26/16 11:57 PM  Result Value Ref Range   Glucose-Capillary 212 (H) 65 - 99 mg/dL   Comment 1 Capillary Specimen   Magnesium     Status: None   Collection Time: 10/27/16  4:00 AM  Result Value Ref Range   Magnesium 2.2 1.7 - 2.4 mg/dL  APTT     Status: None   Collection Time: 10/27/16  4:00 AM  Result Value Ref Range   aPTT 32 24 - 36 seconds  Protime-INR     Status: Abnormal   Collection Time: 10/27/16  4:00 AM  Result Value Ref Range   Prothrombin Time 15.3 (H) 11.4 - 15.2 seconds   INR 1.22   Phosphorus     Status: None   Collection Time: 10/27/16  4:00 AM  Result Value Ref Range   Phosphorus 3.1 2.5 - 4.6 mg/dL  CBC     Status: Abnormal   Collection Time: 10/27/16  4:00 AM  Result Value Ref Range   WBC 10.5 4.0 - 10.5 K/uL   RBC 2.41 (L) 4.22 - 5.81 MIL/uL   Hemoglobin 7.3 (L) 13.0 - 17.0 g/dL   HCT 22.5 (L) 39.0 - 52.0 %   MCV 93.4 78.0 - 100.0 fL   MCH 30.3 26.0 - 34.0 pg   MCHC 32.4 30.0 - 36.0 g/dL  RDW 19.2 (H) 11.5 - 15.5 %   Platelets 193 150 - 400 K/uL  Basic metabolic panel     Status: Abnormal   Collection  Time: 10/27/16  4:00 AM  Result Value Ref Range   Sodium 135 135 - 145 mmol/L   Potassium 3.2 (L) 3.5 - 5.1 mmol/L   Chloride 100 (L) 101 - 111 mmol/L   CO2 25 22 - 32 mmol/L   Glucose, Bld 131 (H) 65 - 99 mg/dL   BUN 72 (H) 6 - 20 mg/dL   Creatinine, Ser 3.87 (H) 0.61 - 1.24 mg/dL   Calcium 7.8 (L) 8.9 - 10.3 mg/dL   GFR calc non Af Amer 13 (L) >60 mL/min   GFR calc Af Amer 15 (L) >60 mL/min   Anion gap 10 5 - 15  Glucose, capillary     Status: Abnormal   Collection Time: 10/27/16  4:06 AM  Result Value Ref Range   Glucose-Capillary 138 (H) 65 - 99 mg/dL  Glucose, capillary     Status: None   Collection Time: 10/27/16  7:48 AM  Result Value Ref Range   Glucose-Capillary 94 65 - 99 mg/dL   Dg Chest Port 1 View  Result Date: 10/27/2016 CLINICAL DATA:  Chest pain.  Recent coronary artery bypass grafting EXAM: PORTABLE CHEST 1 VIEW COMPARISON:  October 22, 2016 FINDINGS: Right jugular catheter tip is unchanged in position near the junction of the right jugular vein and superior vena cava. Left subclavian catheter tip is in the superior vena cava. Feeding tube tip is below the diaphragm. No pneumothorax. There is slightly less interstitial edema compared to prior study. There are areas of patchy atelectasis in both mid and lower lung zones. There is no frank airspace consolidation. There is cardiomegaly with mild pulmonary venous hypertension. There is aortic atherosclerosis. Total shoulder replacement noted on the left. Left atrial appendage clamp present. Patient is status post coronary artery bypass grafting. There are surgical clips in the right cervicothoracic junction region. There is left sided carotid artery calcification. IMPRESSION: Tube and catheter positions as described without evident pneumothorax. Evidence a degree of congestive heart failure with less edema compared to most recent study. Areas of patchy atelectasis without evidence of frank airspace consolidation. Cardiac  silhouette is stable. There is aortic atherosclerosis. Aortic Atherosclerosis (ICD10-I70.0). Electronically Signed   By: Lowella Grip III M.D.   On: 10/27/2016 07:30    Assessment/Plan: Diagnosis: Debility Labs independently reviewed.  Records reviewed and summated above.  1. Does the need for close, 24 hr/day medical supervision in concert with the patient's rehab needs make it unreasonable for this patient to be served in a less intensive setting? Yes 2. Co-Morbidities requiring supervision/potential complications:  Acute on chronic anemia (transfuse as necessary to ensure appropriate perfusion for increased activity tolerance), AKI on CKD currently receiving HD(avoid nephrotoxic meds), HTN (monitor and provide prns in accordance with increased physical exertion and pain), chronic diastolic congestive heart failure (monitor for signs/symptoms of fluid overload), recurrent PAF (cont meds, monitor HR with increased mobility), COPD (monitor RR and O2 Sats with increased mobility),  remote tobacco abuse, rheumatoid arthritis (monitor for flares), diabetes mellitus (Monitor in accordance with exercise and adjust meds as necessary) 3. Due to bladder management, bowel management, safety, skin/wound care, disease management, pain management and patient education, does the patient require 24 hr/day rehab nursing? Yes 4. Does the patient require coordinated care of a physician, rehab nurse, PT (1-2 hrs/day, 5 days/week), OT (1-2 hrs/day, 5  days/week) and SLP (1-2 hrs/day, 5 days/week) to address physical and functional deficits in the context of the above medical diagnosis(es)? Yes Addressing deficits in the following areas: balance, endurance, locomotion, strength, transferring, bowel/bladder control, bathing, dressing, toileting, swallowing and psychosocial support 5. Can the patient actively participate in an intensive therapy program of at least 3 hrs of therapy per day at least 5 days per week?  Potentially 6. The potential for patient to make measurable gains while on inpatient rehab is good 7. Anticipated functional outcomes upon discharge from inpatient rehab are min assist  with PT, min assist with OT, modified independent and supervision with SLP. 8. Estimated rehab length of stay to reach the above functional goals is: 18-22 days. 9. Anticipated D/C setting: Other 10. Anticipated post D/C treatments: SNF 11. Overall Rehab/Functional Prognosis: good  RECOMMENDATIONS: This patient's condition is appropriate for continued rehabilitative care in the following setting: Pt refusing CIR and states he would like to go to [facility] at discharge.  Given level of activity tolerance, this is appropriate.   Patient has agreed to participate in recommended program. Yes Note that insurance prior authorization may be required for reimbursement for recommended care.  Comment: Rehab Admissions Coordinator to follow up.  Delice Lesch, MD, ABPMR Lauraine Rinne J., PA-C 10/27/2016

## 2016-10-27 NOTE — Consult Note (Signed)
[facility] NOTE   MRN : [id number]  Reason for Consult: Tunneled Dialysis catheter Referring Physician: Dr. Arty Baumgartner  History of Present Illness: 83 y/o male with acute on CKD s/p    Procedure:     10/09/2016   Coronary Artery Bypass Grafting x 2  Left Internal Mammary Artery to Distal Left Anterior Descending Coronary Artery Saphenous Vein Graft to Obtuse Marginal Branch of Left Circumflex Coronary Artery Endoscopic Vein Harvest from Right Thigh    Maze Procedure  Complete bilateral atrial lesion set using bipolar radiofrequency and cryothermy ablation Clipping of LA appendage (Atriclip, size 48m)  He currently has a temporary HD catheter 10/15/2016 and we have been asked to provide a tunneled HD catheter, as well as permanent access av fistula verses graft.       Past medical history: hypertension, chronic diastolic congestive heart failure, stage IV chronic kidney disease, hyperlipidemia, recurrent paroxysmal atrial fibrillation on long-term anticoagulation, bronchiectasis with chronic dyspnea on exertion, COPD with remote history of tobacco use, asthma, rheumatoid arthritis, type 2 diabetes mellitus, and anemia.   I have examined the patient, reviewed and agree with above.  ECurt Jews MD 10/27/2016 12:52 PM   Current Facility-Administered Medications  Medication Dose Route Frequency Provider Last Rate Last Dose  . 0.9 %  sodium chloride infusion  250 mL Intravenous Continuous ORexene Alberts MD      . 0.9 %  sodium chloride infusion  250 mL Intravenous PRN ORexene Alberts MD      . amiodarone (PACERONE) tablet 400 mg  400 mg Per Tube Q12H ORexene Alberts MD   400 mg at 10/27/16 0906  . aspirin chewable tablet 81 mg  81 mg Per Tube Daily BEinar Grad RPH   81 mg at 10/27/16 00865 . chlorhexidine gluconate (MEDLINE KIT) (PERIDEX) 0.12 % solution 15 mL  15 mL Mouth Rinse BID ORexene Alberts MD   15 mL at 10/27/16  0907  . Chlorhexidine Gluconate Cloth 2 % PADS 6 each  6 each Topical Q0600 ORexene Alberts MD   6 each at 10/27/16 [phone] . enoxaparin (LOVENOX) injection 30 mg  30 mg Subcutaneous Q48H ORexene Alberts MD   30 mg at 10/26/16 0910  . feeding supplement (PRO-STAT SUGAR FREE 64) liquid 30 mL  30 mL Per Tube TID ORexene Alberts MD   30 mL at 10/27/16 0905  . feeding supplement (VITAL 1.5 CAL) liquid 1,000 mL  1,000 mL Per Tube Continuous ORexene Alberts MD      . fentaNYL (SUBLIMAZE) injection 25 mcg  25 mcg Intravenous Q1H PRN ORexene Alberts MD   25 mcg at 10/26/16 1241  . fentaNYL (SUBLIMAZE) injection 50 mcg  50 mcg Intravenous Q1H PRN ORexene Alberts MD      . folic acid (FOLVITE) tablet 1 mg  1 mg Per Tube Daily BEinar Grad RPH   1 mg at 10/27/16 09629 . Gerhardt's butt cream   Topical BID ORexene Alberts MD      . guaiFENesin (ROBITUSSIN) 100 MG/5ML solution 400 mg  20 mL Oral Q4H PRN ORexene Alberts MD      . heparin bolus via infusion syringe 1,000 Units  1,000 Units CRRT PRN DJamal Maes MD      . heparin injection 1,000-6,000 Units  1,000-6,000 Units CRRT PRN DJamal Maes MD   2,000 Units at 10/23/16 1013  . heparinized saline (2000 units/L)  primer fluid for CRRT   CRRT PRN Jamal Maes, MD 999 mL/hr at 10/21/16 1705    . insulin aspart (novoLOG) injection 0-24 Units  0-24 Units Subcutaneous Q4H Rexene Alberts, MD   2 Units at 10/27/16 0413  . insulin detemir (LEVEMIR) injection 20 Units  20 Units Subcutaneous BID Rexene Alberts, MD   20 Units at 10/27/16 [phone]  . levothyroxine (SYNTHROID, LEVOTHROID) tablet 25 mcg  25 mcg Per Tube QAC breakfast Einar Grad, RPH   25 mcg at 10/27/16 0350  . MEDLINE mouth rinse  15 mL Mouth Rinse q12n4p Rexene Alberts, MD   15 mL at 10/24/16 1727  . midazolam (VERSED) injection 1-2 mg  1-2 mg Intravenous Q2H PRN Rush Farmer, MD   2 mg at 10/20/16 0045  . moving right along book   Does not apply Once Rexene Alberts, MD      . multivitamin liquid 15 mL  15 mL Per Tube Daily Rexene Alberts, MD   15 mL at 10/27/16 0905  . ondansetron (ZOFRAN) injection 4 mg  4 mg Intravenous Q6H PRN Rexene Alberts, MD   4 mg at 10/26/16 0152  . pantoprazole sodium (PROTONIX) 40 mg/20 mL oral suspension 40 mg  40 mg Per Tube Daily Minor, Grace Bushy, NP   40 mg at 10/27/16 0905  . patient's guide to using coumadin book   Does not apply Once Lyndee Leo, [facility]      . RESOURCE THICKENUP CLEAR   Oral PRN Rexene Alberts, MD      . sodium chloride flush (NS) 0.9 % injection 10-40 mL  10-40 mL Intracatheter Q12H Rexene Alberts, MD   30 mL at 10/27/16 0830  . sodium chloride flush (NS) 0.9 % injection 10-40 mL  10-40 mL Intracatheter PRN Rexene Alberts, MD   20 mL at 10/22/16 0918  . sodium chloride flush (NS) 0.9 % injection 3 mL  3 mL Intravenous Q12H Rexene Alberts, MD   3 mL at 10/27/16 0908  . sodium chloride flush (NS) 0.9 % injection 3 mL  3 mL Intravenous PRN Rexene Alberts, MD   3 mL at 10/13/16 1100  . sodium chloride flush (NS) 0.9 % injection 3 mL  3 mL Intravenous Q12H Rexene Alberts, MD   3 mL at 10/26/16 0911  . sodium chloride flush (NS) 0.9 % injection 3 mL  3 mL Intravenous PRN Rexene Alberts, MD   3 mL at 10/13/16 1100  . traZODone (DESYREL) tablet 25 mg  25 mg Per Tube QHS PRN Melrose Nakayama, MD   25 mg at 10/26/16 2249  . warfarin (COUMADIN) tablet 5 mg  5 mg Per Tube [id number] Rexene Alberts, MD      . Warfarin - Physician Dosing Inpatient   Does not apply q1800 Rexene Alberts, MD        Pt meds include: Statin :Yes Betablocker: No ASA: Yes Other anticoagulants/antiplatelets: Coumadin   Past Medical History:  Diagnosis Date  . Anemia   . Asthma   . Atrial fibrillation ([place]) 12/08/2013  . CHF (congestive heart failure) ([place])   . Chronic joint pain    "from RA" (10/01/2016)  . CKD (chronic kidney disease), stage III   . Constipation    takes stool softener daily  .  COPD (chronic obstructive pulmonary disease) ([place])   . Coronary artery disease   . Coronary artery disease involving  native coronary artery of native heart with angina pectoris ([place]) 09/30/2016  . Dysrhythmia    a-fib  . GERD (gastroesophageal reflux disease)    takes Zantac daily   . History of blood transfusion    "related to one of my ORs"  . Hyperlipidemia    takes Pravastatin daily  . Hypertension    takes Cardizem,Proscar,and Lisinopril daily  . Hypothyroidism   . Impaired hearing    wears hearing aids  . Joint swelling    "from RA" (10/01/2016)  . Peripheral neuropathy   . Pneumonia 2017  . Rheumatoid arthritis ([place])    "all over; take orencia  q 4 wks" (10/01/2016)  . S/P CABG x 2 10/09/2016   LIMA to LAD, SVG to OM, EVH via right thigh  . S/P Maze operation for atrial fibrillation 10/09/2016   Complete bilateral atrial lesion set using bipolar radiofrequency and cryothermy ablation with clipping of LA appendage  . Type II diabetes mellitus ([place])     Past Surgical History:  Procedure Laterality Date  . AMPUTATION TOE Left 11/08/2015   Procedure: LEFT HALLUX AMPUTATION AT THE METATARSOPHALANGEAL JOINT;  Surgeon: Wylene Simmer, MD;  Location: [place];  Service: Orthopedics;  Laterality: Left;  . ANKLE FUSION Left 2014  . BIOPSY THYROID  2014  . CARDIAC CATHETERIZATION  09/30/2016  . CARPAL TUNNEL RELEASE Bilateral 1990's  . CATARACT EXTRACTION W/ INTRAOCULAR LENS  IMPLANT, BILATERAL Bilateral 1990's  . CLIPPING OF ATRIAL APPENDAGE  10/09/2016   Procedure: CLIPPING OF ATRIAL APPENDAGE;  Surgeon: Rexene Alberts, MD;  Location: [place];  Service: Open Heart Surgery;;  . COLONOSCOPY    . CORONARY ARTERY BYPASS GRAFT N/A 10/09/2016   Procedure: CORONARY ARTERY BYPASS GRAFTING (CABG) x two, using left internal mammary artery and right leg greater saphenous vein harvested endoscopically;  Surgeon: Rexene Alberts, MD;  Location: [place];  Service: Open Heart Surgery;   Laterality: N/A;  . FOREIGN BODY REMOVAL Right 09/06/2013   Procedure: REMOVAL FOREIGN BODY RIGHT INDEX FINGER;  Surgeon: Daryll Brod, MD;  Location: [place];  Service: Orthopedics;  Laterality: Right;  ANESTHESIA: IV REGIONAL FAB  . JOINT REPLACEMENT    . MAZE N/A 10/09/2016   Procedure: MAZE;  Surgeon: Rexene Alberts, MD;  Location: [place];  Service: Open Heart Surgery;  Laterality: N/A;  . REPAIR TENDONS FOOT Left 1946; 1984   "farming accident; repaired tendons both times"  . RIGHT/LEFT HEART CATH AND CORONARY ANGIOGRAPHY N/A 09/30/2016   Procedure: RIGHT/LEFT HEART CATH AND CORONARY ANGIOGRAPHY;  Surgeon: Nigel Mormon, MD;  Location: [place] [facility];  Service: Cardiovascular;  Laterality: N/A;  . SHOULDER ARTHROSCOPY W/ ROTATOR CUFF REPAIR Right 1990's  . TEE WITHOUT CARDIOVERSION N/A 10/09/2016   Procedure: TRANSESOPHAGEAL ECHOCARDIOGRAM (TEE);  Surgeon: Rexene Alberts, MD;  Location: [place];  Service: Open Heart Surgery;  Laterality: N/A;  . THYROIDECTOMY Right 02/01/2013   Procedure: RIGHT THYROIDECTOMY LOBECTOMY;  Surgeon: Earnstine Regal, MD;  Location: [place];  Service: General;  Laterality: Right;  . TONSILLECTOMY  1930's  . TOTAL KNEE ARTHROPLASTY Right 12/16/2010   Procedure: TOTAL KNEE ARTHROPLASTY; rt Surgeon: Rudean Haskell, MD;  Location: [place];  Service: Orthopedics;  Laterality: Right;  . TOTAL SHOULDER REPLACEMENT Left 03/2010    Social History Social History  Substance Use Topics  . Smoking status: Former Smoker    Packs/day: 1.00    Years: 15.00    Types: Cigarettes  Quit date: 09/01/1965  . Smokeless tobacco: Never Used     Comment: quit smoking in 1967  . 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 Cancer Mother        breast  . Aneurysm Father     No Known Allergies   REVIEW OF SYSTEMS  General: _0   Weight loss, _1  Fever, _2  chills Neurologic: _3  Dizziness, _4  Blackouts, _5  Seizure _6  Stroke, _7  "Mini stroke", _8  Slurred speech, _9  Temporary blindness; _10  weakness in arms or legs, _11  Hoarseness _12  Dysphagia Cardiac: _13  Chest pain/pressure, _14  Shortness of breath at rest _15  Shortness of breath with exertion, _16  Atrial fibrillation or irregular heartbeat  Vascular: _17  Pain in legs with walking, _18  Pain in legs at rest, _19  Pain in legs at night,  _20  Non-healing ulcer, _21  Blood clot in vein/DVT,   Pulmonary: _22  Home oxygen, _23  Productive cough, _24  Coughing up blood, _25  Asthma,  _26  Wheezing _27  COPD Musculoskeletal:  _28  Arthritis, _29  Low back pain, _30  Joint pain Hematologic: _31  Easy Bruising, _32  Anemia; _33  Hepatitis Gastrointestinal: _34  Blood in stool, _35  Gastroesophageal Reflux/heartburn, Urinary: [x ] chronic Kidney disease, [x ] on HD - [x ] MWF or _36  TTHS, _37  Burning with urination, _38  Difficulty urinating Skin: _39  Rashes, _40  Wounds Psychological: _41  Anxiety, _42  Depression  Physical Examination Vitals:   10/27/16 1145 10/27/16 1156 10/27/16 1200 10/27/16 1215  BP: (!) 95/52 (!) 103/56 (!) 98/55 (!) 90/52  Pulse: 88 89 89 88  Resp: 19 (!) _43 Temp:  97.6 F (36.4 C)  97.6 F (36.4 C)  TempSrc:  Oral  Oral  SpO2: 97% 98% 99% 99%  Weight:      Height:       Body mass index is 31.78 kg/m.  General:  WDWN in NAD HENT: WNL, NG tube in place Eyes: Pupils equal Pulmonary: normal non-labored breathing , without Rales, rhonchi,  wheezing Cardiac: A fib, without  Murmurs, rubs or gallops; No carotid bruits Abdomen: soft, NT, no masses Skin: no rashes, ulcers noted;  no Gangrene , no cellulitis; no open wounds;   Vascular Exam/Pulses:UE warm, motor intact, sensation intact, non phallales radial brachial B.  Right UE O2 sensor in place and working well.   Musculoskeletal: no muscle wasting or atrophy;  Neurologic: A&O X 3; Appropriate  Affect ;  SENSATION: normal; MOTOR FUNCTION: grossly intact all 4 ext. Speech is fluent/normal   Significant Diagnostic Studies: CBC Lab Results  Component Value Date   WBC 10.5 10/27/2016   HGB 7.3 (L) 10/27/2016   HCT 22.5 (L) 10/27/2016   MCV 93.4 10/27/2016   PLT 193 10/27/2016    BMET    Component Value Date/Time   NA 135 10/27/2016 0400   K 3.2 (L) 10/27/2016 0400   CL 100 (L) 10/27/2016 0400   CO2 25 10/27/2016 0400   GLUCOSE 131 (H) 10/27/2016 0400   BUN 72 (H) 10/27/2016 0400   CREATININE 3.87 (H) 10/27/2016 0400   CALCIUM 7.8 (L) 10/27/2016 0400   GFRNONAA 13 (L) 10/27/2016 0400   GFRAA 15 (L)  10/27/2016 0400   Estimated Creatinine Clearance: 15.9 mL/min (A) (by C-G formula based on SCr of 3.87 mg/dL (H)).  COAG Lab Results  Component Value Date   INR 1.22 10/27/2016   INR 1.27 10/26/2016   INR 1.40 10/25/2016     Non-Invasive Vascular Imaging: pending vein mapping and arterial duplexes B UE  ASSESSMENT/PLAN:  Acute on CKD Plan remove temp right HD catheter and place tunneled catheter.  He is left hand dominant so we will plan right UE AV fistula verses AV graft pending vein mapping.   Laurence Slate [facility] 10/27/2016 12:28 PM   I have examined the patient, reviewed and agree with above.Palp ulnar pulse bilat. No radial pulse.. 2+ brachial bilat.  Small surface veins by PE.  Left handed. Being started on Coumadin so would like to get [facility] and arm access. Discussed at length with patient.  Will plan OR tomorrow  Curt Jews, MD 10/27/2016 12:53 PM

## 2016-10-27 NOTE — Progress Notes (Signed)
VASCULAR LAB PRELIMINARY  PRELIMINARY  PRELIMINARY  PRELIMINARY  Upper extremity arterial duplex completed.       Haruo Stepanek, RVT 10/27/2016, 4:37 PM

## 2016-10-27 NOTE — Progress Notes (Signed)
Inpatient Diabetes Program Recommendations  AACE/ADA: New Consensus Statement on Inpatient Glycemic Control (2015)  Target Ranges:  Prepandial:   less than 140 mg/dL      Peak postprandial:   less than 180 mg/dL (1-2 hours)      Critically ill patients:  140 - 180 mg/dL   Lab Results  Component Value Date   GLUCAP 94 10/27/2016   HGBA1C 7.2 (H) 10/06/2016    Review of Glycemic Control Results for BAYLIN, GAMBLIN (MRN [id number]) as of 10/27/2016 11:02  Ref. Range 10/26/2016 15:27 10/26/2016 20:26 10/26/2016 23:57 10/27/2016 04:06 10/27/2016 07:48  Glucose-Capillary Latest Ref Range: 65 - 99 mg/dL 73 157 (H) 212 (H) 138 (H) 94   Inpatient Diabetes Program Recommendations:    Noted hypoglycemia post Novolog correction. Please consider: -Decrease Novolog correction to moderate correction scale.  Thank you, Nani Gasser. Jorell Agne, RN, MSN, CDE  Diabetes Coordinator Inpatient Glycemic Control Team Team Pager [phone] (8am-5pm) 10/27/2016 11:06 AM

## 2016-10-27 NOTE — Progress Notes (Addendum)
Right  Upper Extremity Vein Map    Cephalic  Segment Diameter Depth Comment  1. Axilla 3.36mm 3.10mm   2. Mid upper arm 4.23mm 3.3mm   3. Above AC 3.33mm 3.37mm   4. In [facility] 4.4mm 6.46mm   5. Below AC 2.45mm 8.31mm Multiple branches  6. Mid forearm 3.51mm 4.44mm   7. Wrist 77mm 2.52mm    mm mm    mm mm    mm mm    Basilic  Segment Diameter Depth Comment  1. Axilla 4.78mm 50mm   2. Mid upper arm 3.18mm 32mm   3. Above AC 3.33mm 91mm   4. In [facility] 3.5mm 6.73mm   5. Below AC 3.8mm 3.51mm branch  6. Mid forearm 3.52mm 6.63mm   7. Wrist 3.10mm 3.2mm    mm mm    mm mm    mm mm    Left Upper Extremity Vein Map    Cephalic  Segment Diameter Depth Comment  1. Axilla 5.23mm 3.12mm   2. Mid upper arm 4.31mm 4.7mm branch  3. Above AC 4.67mm 2.34mm   4. In [facility] 6.34mm 2.67mm   5. Below AC 2.53mm 37mm branch  6. Mid forearm 2.42mm 7.61mm   7. Wrist 26mm 2.45mm    mm mm    mm mm    mm mm    Basilic  Segment Diameter Depth Comment  1. Axilla 4.48mm 77mm   2. Mid upper arm 4.103mm 64mm branch  3. Above AC 3.2mm 17mm   4. In [facility] 3.46mm 9.37mm   5. Below AC 22mm 5.20mm   6. Mid forearm 2.61mm 6.38mm   7. Wrist 2.52mm 3.74mm    mm mm    mm mm    mm mm

## 2016-10-27 NOTE — Progress Notes (Signed)
Patient ID: Irena Cords, male   DOB: 11-May-1930, 83 y.o.   MRN: [id number] S: No events overnight O:BP (!) 116/53   Pulse 85   Temp 97.9 F (36.6 C) (Oral)   Resp 17   Ht '5\' 8"'$  (1.727 m)   Wt 93.4 kg (205 lb 14.6 oz)   SpO2 99%   BMI 31.31 kg/m   Intake/Output Summary (Last 24 hours) at 10/27/16 1112 Last data filed at 10/27/16 0845  Gross per 24 hour  Intake          1175.84 ml  Output              625 ml  Net           550.84 ml   Intake/Output: I/O last 3 completed shifts: In: 2165.8 [P.O.:50; NG/GT:1815.8; IV Piggyback:300] Out: 625 [Stool:625]  Intake/Output this shift:  Total I/O In: 120 [P.O.:120] Out: -  Weight change: -2.2 kg (-4 lb 13.6 oz) Gen: NAD CVS: no rub Resp:CTA Abd: obese, +BS, soft Ext: no edema   Recent Labs Lab 10/21/16 0303  10/21/16 1648 10/22/16 0315 10/22/16 1512  10/23/16 0441  10/23/16 1552 10/24/16 0504 10/24/16 0725 10/24/16 1549 10/24/16 1600 10/25/16 0428 10/25/16 1509 10/26/16 0348 10/26/16 1621 10/27/16 0400  NA 135  < > 138 139 138  < > 139  < > 139  --  142 144 142  --  137  --  137 135  K 5.9*  < > 3.4* 3.3* 3.5  < > 3.4*  < > 3.4*  --  3.3* 3.0* 3.0*  --  3.1*  --  3.1* 3.2*  CL 101  < > 100* 102 104  < > 103  < > 103  --  109 107 108  --  105  --  96* 100*  CO2 26  --  '26 27 26  '$ --  24  --  23  --  21*  --  23  --  23  --   --  25  GLUCOSE 130*  < > 160* 165* 158*  < > 197*  < > 147*  --  188* 131* 130*  --  186*  --  60* 131*  BUN 31*  < > 32* 38* 37*  < > 45*  < > 57*  --  75* 70* 83*  --  99*  --  56* 72*  CREATININE 1.69*  < > 1.70* 1.76* 1.76*  < > 1.96*  < > 2.36*  --  3.20* 3.40* 3.47*  --  4.01*  --  3.30* 3.87*  ALBUMIN 2.1*  --  1.9* 1.9* 2.0*  --  2.1*  --  2.2*  --   --   --  2.0*  --  2.0*  --   --   --   CALCIUM 8.0*  --  8.0* 7.8* 7.7*  --  8.1*  --  8.1*  --  8.1*  --  8.0*  --  7.7*  --   --  7.8*  PHOS 2.7  --  2.1* 2.0* 1.1*  --  1.6*  --  2.1* 2.5  --   --  2.2* 3.1 2.8 2.4*  --  3.1  AST 201*  --    --  76*  --   --  54*  --   --   --   --   --   --   --   --   --   --   --  ALT 312*  --   --  194*  --   --  143*  --   --   --   --   --   --   --   --   --   --   --   < > = values in this interval not displayed. Liver Function Tests:  Recent Labs Lab 10/21/16 0303  10/22/16 0315  10/23/16 0441 10/23/16 1552 10/24/16 1600 10/25/16 1509  AST 201*  --  76*  --  54*  --   --   --   ALT 312*  --  194*  --  143*  --   --   --   ALKPHOS 326*  --  270*  --  287*  --   --   --   BILITOT 2.2*  --  0.9  --  1.1  --   --   --   PROT 5.3*  --  4.9*  --  5.3*  --   --   --   ALBUMIN 2.1*  < > 1.9*  < > 2.1* 2.2* 2.0* 2.0*  < > = values in this interval not displayed. No results for input(s): LIPASE, AMYLASE in the last 168 hours. No results for input(s): AMMONIA in the last 168 hours. CBC:  Recent Labs Lab 10/21/16 0303  10/22/16 0315  10/23/16 0441  10/24/16 0725  10/25/16 1527 10/26/16 1621 10/27/16 0400  WBC 18.9*  --  15.5*  --  13.9*  --  19.7*  --  12.8*  --  10.5  NEUTROABS 16.1*  --   --   --   --   --   --   --   --   --   --   HGB 9.1*  < > 8.4*  < > 8.9*  < > 8.4*  < > 7.9* 8.2* 7.3*  HCT 28.3*  < > 26.2*  < > 28.0*  < > 26.5*  < > 25.0* 24.0* 22.5*  MCV 89.8  --  90.7  --  91.8  --  93.3  --  94.0  --  93.4  PLT 185  --  156  --  193  --  198  --  175  --  193  < > = values in this interval not displayed. Cardiac Enzymes: No results for input(s): CKTOTAL, CKMB, CKMBINDEX, TROPONINI in the last 168 hours. CBG:  Recent Labs Lab 10/26/16 1527 10/26/16 2026 10/26/16 2357 10/27/16 0406 10/27/16 0748  GLUCAP 73 157* 212* 138* 94    Iron Studies: No results for input(s): IRON, TIBC, TRANSFERRIN, FERRITIN in the last 72 hours. Studies/Results: Dg Chest Port 1 View  Result Date: 10/27/2016 CLINICAL DATA:  Chest pain.  Recent coronary artery bypass grafting EXAM: PORTABLE CHEST 1 VIEW COMPARISON:  October 22, 2016 FINDINGS: Right jugular catheter tip is  unchanged in position near the junction of the right jugular vein and superior vena cava. Left subclavian catheter tip is in the superior vena cava. Feeding tube tip is below the diaphragm. No pneumothorax. There is slightly less interstitial edema compared to prior study. There are areas of patchy atelectasis in both mid and lower lung zones. There is no frank airspace consolidation. There is cardiomegaly with mild pulmonary venous hypertension. There is aortic atherosclerosis. Total shoulder replacement noted on the left. Left atrial appendage clamp present. Patient is status post coronary artery bypass grafting. There are surgical clips in the right cervicothoracic junction region.  There is left sided carotid artery calcification. IMPRESSION: Tube and catheter positions as described without evident pneumothorax. Evidence a degree of congestive heart failure with less edema compared to most recent study. Areas of patchy atelectasis without evidence of frank airspace consolidation. Cardiac silhouette is stable. There is aortic atherosclerosis. Aortic Atherosclerosis (ICD10-I70.0). Electronically Signed   By: Lowella Grip III M.D.   On: 10/27/2016 07:30   . amiodarone  400 mg Per Tube Q12H  . aspirin  81 mg Per Tube Daily  . chlorhexidine gluconate (MEDLINE KIT)  15 mL Mouth Rinse BID  . Chlorhexidine Gluconate Cloth  6 each Topical Q0600  . enoxaparin (LOVENOX) injection  30 mg Subcutaneous Q48H  . feeding supplement (PRO-STAT SUGAR FREE 64)  30 mL Per Tube TID  . folic acid  1 mg Per Tube Daily  . Gerhardt's butt cream   Topical BID  . insulin aspart  0-24 Units Subcutaneous Q4H  . insulin detemir  20 Units Subcutaneous BID  . levothyroxine  25 mcg Per Tube QAC breakfast  . mouth rinse  15 mL Mouth Rinse q12n4p  . moving right along book   Does not apply Once  . multivitamin  15 mL Per Tube Daily  . pantoprazole sodium  40 mg Per Tube Daily  . patient's guide to using coumadin book   Does not  apply Once  . sodium chloride flush  10-40 mL Intracatheter Q12H  . sodium chloride flush  3 mL Intravenous Q12H  . sodium chloride flush  3 mL Intravenous Q12H  . warfarin  5 mg Per Tube q1800  . Warfarin - Physician Dosing Inpatient   Does not apply q1800    BMET    Component Value Date/Time   NA 135 10/27/2016 0400   K 3.2 (L) 10/27/2016 0400   CL 100 (L) 10/27/2016 0400   CO2 25 10/27/2016 0400   GLUCOSE 131 (H) 10/27/2016 0400   BUN 72 (H) 10/27/2016 0400   CREATININE 3.87 (H) 10/27/2016 0400   CALCIUM 7.8 (L) 10/27/2016 0400   GFRNONAA 13 (L) 10/27/2016 0400   GFRAA 15 (L) 10/27/2016 0400   CBC    Component Value Date/Time   WBC 10.5 10/27/2016 0400   RBC 2.41 (L) 10/27/2016 0400   HGB 7.3 (L) 10/27/2016 0400   HCT 22.5 (L) 10/27/2016 0400   PLT 193 10/27/2016 0400   MCV 93.4 10/27/2016 0400   MCH 30.3 10/27/2016 0400   MCHC 32.4 10/27/2016 0400   RDW 19.2 (H) 10/27/2016 0400   LYMPHSABS 1.1 10/21/2016 0303   MONOABS 1.3 (H) 10/21/2016 0303   EOSABS 0.4 10/21/2016 0303   BASOSABS 0.0 10/21/2016 0303     Assessment/Plan:  1. AKI/CKD stage 3-4- has been oliguric and requiring CVVHD/IHD.  CVVHD initiated 10/15/16 and stopped 10/25/16 with plan for IHD today.   2. CAD s/p CABG x 2 (LIMA, and right SVG) and MAZE procedure 10/09/16.  Doing better and off of pressors 3. Anemia of CKD with ABLA- on ESA and transfuse prn 1. feraheme 10/15/16 and again 10/22/16 4. Vascular access- will need tunneled HD cath as well as AVF/AVG, will consult VVs 5. Severe debilitation- continue with PT/OT and will likely require CIR/SNF once stable for discharge  6. DM- per primary 7. Hypokalemia- use 4K bath with HD.  Donetta Potts, MD [employer] [phone]

## 2016-10-27 NOTE — Plan of Care (Signed)
Problem: Cardiac: Goal: Hemodynamic stability will improve Outcome: Progressing Within parameters Goal: Ability to maintain an adequate cardiac output will improve Outcome: Progressing Within parameters  Problem: Education: Goal: Ability to demonstrate proper wound care will improve Outcome: Progressing Education ongoing Goal: Knowledge of disease or condition will improve Outcome: Progressing Education ongoing  Problem: Urinary Elimination: Goal: Ability to achieve and maintain adequate renal perfusion and functioning will improve Outcome: Not Progressing Pt on HD

## 2016-10-27 NOTE — Progress Notes (Addendum)
[address]       [place] [address]             [phone]        CARDIOTHORACIC SURGERY PROGRESS NOTE   R18 Days Post-Op Procedure(s) (LRB): CORONARY ARTERY BYPASS GRAFTING (CABG) x two, using left internal mammary artery and right leg greater saphenous vein harvested endoscopically (N/A) MAZE (N/A) TRANSESOPHAGEAL ECHOCARDIOGRAM (TEE) (N/A) CLIPPING OF ATRIAL APPENDAGE  Subjective: Overall doing fairly well.  No pain, SOB.  Complains that no food tastes good.  Still very weak.  Still having significant volume liquid stool.  Objective: Vital signs: BP Readings from Last 1 Encounters:  10/27/16 (!) 109/56   Pulse Readings from Last 1 Encounters:  10/27/16 86   Resp Readings from Last 1 Encounters:  10/27/16 16   Temp Readings from Last 1 Encounters:  10/27/16 97.9 F (36.6 C)    Hemodynamics:    Physical Exam:  Rhythm:   sinus  Breath sounds: clear  Heart sounds:  RRR w/out murmur  Incisions:  Clean and dry  Abdomen:  Soft, non-distended, non-tender  Extremities:  Warm, well-perfused    Intake/Output from previous day: 10/14 0701 - 10/15 0700 In: 1345.8 [P.O.:30; NG/GT:1065.8; IV Piggyback:250] Out: 625 [Stool:625] Intake/Output this shift: No intake/output data recorded.  Lab Results:  CBC: Recent Labs  10/25/16 1527 10/26/16 1621 10/27/16 0400  WBC 12.8*  --  10.5  HGB 7.9* 8.2* 7.3*  HCT 25.0* 24.0* 22.5*  PLT 175  --  193    BMET:  Recent Labs  10/25/16 1509 10/26/16 1621 10/27/16 0400  NA 137 137 135  K 3.1* 3.1* 3.2*  CL 105 96* 100*  CO2 23  --  25  GLUCOSE 186* 60* 131*  BUN 99* 56* 72*  CREATININE 4.01* 3.30* 3.87*  CALCIUM 7.7*  --  7.8*     PT/INR:   Recent Labs  10/27/16 0400  LABPROT 15.3*  INR 1.22    CBG (last 3)   Recent Labs  10/26/16 2026 10/26/16 2357 10/27/16 0406  GLUCAP 157* 212* 138*    ABG    Component Value Date/Time   PHART 7.417 10/20/2016 0337   PCO2ART 42.7  10/20/2016 0337   PO2ART 76.0 (L) 10/20/2016 0337   HCO3 27.5 10/20/2016 0337   TCO2 28 10/26/2016 1621   ACIDBASEDEF 1.0 10/17/2016 0007   O2SAT 71.5 10/20/2016 0345    CXR: PORTABLE CHEST 1 VIEW  COMPARISON:  October 22, 2016  FINDINGS: Right jugular catheter tip is unchanged in position near the junction of the right jugular vein and superior vena cava. Left subclavian catheter tip is in the superior vena cava. Feeding tube tip is below the diaphragm. No pneumothorax. There is slightly less interstitial edema compared to prior study. There are areas of patchy atelectasis in both mid and lower lung zones. There is no frank airspace consolidation.  There is cardiomegaly with mild pulmonary venous hypertension. There is aortic atherosclerosis.  Total shoulder replacement noted on the left. Left atrial appendage clamp present. Patient is status post coronary artery bypass grafting. There are surgical clips in the right cervicothoracic junction region. There is left sided carotid artery calcification.  IMPRESSION: Tube and catheter positions as described without evident pneumothorax. Evidence a degree of congestive heart failure with less edema compared to most recent study. Areas of patchy atelectasis without evidence of frank airspace consolidation. Cardiac silhouette is stable. There is aortic atherosclerosis.  Aortic Atherosclerosis (ICD10-I70.0).  Electronically Signed   By: Lowella Grip III M.D.   On: 10/27/2016 07:30  Assessment/Plan: S/P Procedure(s) (LRB): CORONARY ARTERY BYPASS GRAFTING (CABG) x two, using left internal mammary artery and right leg greater saphenous vein harvested endoscopically (N/A) MAZE (N/A) TRANSESOPHAGEAL ECHOCARDIOGRAM (TEE) (N/A) CLIPPING OF ATRIAL APPENDAGE  Overall stable maintaining NSR w/ stable BP Breathing comfortably on room air and CXR looks good Remains in renal failure - probably needs to have tunneled  dialysis catheter placed so that old temporary lines can be removed Expected post op acute blood loss anemia, Hgb down further this morning Liquid stool persists, probably not absorbing much - might benefit from switch to elemental prep Encourage po's with dysphagia precautions and advance as tolerated but continue tube feeds until oral intake improved Continue PT - severely debilitated Consult PMR team to consider d/c to CIR service   Change tube feeds to elemental preop  Transfuse 2 units PRBC's  Place tunneled dialysis catheter and d/c old temporary lines  Increase Coumadin dose  Mobilize, PT  Consult PMR team  Rexene Alberts, MD 10/27/2016 8:05 AM

## 2016-10-27 NOTE — Progress Notes (Signed)
  Speech Language Pathology Treatment: Dysphagia  Patient Details Name: [name] MRN: [id number] DOB: 1930/04/22 Today's Date: 10/27/2016 Time: 5809-9833 SLP Time Calculation (min) (ACUTE ONLY): 12 min  Assessment / Plan / Recommendation Clinical Impression  Brief observation with applesauce (puree) consistency. He needed min-mod verbal reminders for pharyngeal clearance with second swallow with clinical reasoning. No indications of decreased airway protection at bedside however unable to assess current swallow status at bedside alone. Recommend MBS tomorrow for potential initiation of liquids and upgrade in texture.    HPI HPI: Patient is an 83 year old male with history of hypertension, COPD, GERD, pna, chronic diastolic congestive heart failure, stage IV chronic kidney disease, hyperlipidemia, recurrent paroxysmal atrial fibrillation, bronchiectasis, asthma, rheumatoid arthritis, type 2 diabetes mellitus, and anemia. Underwent CABG 9/27. Intubated 9/27-9/28, 10/3-10/8. CXR RIGHT pigtail catheter no longer seen. No pneumothorax. Slight improvement in pulmonary edema.      SLP Plan  MBS       Recommendations  Diet recommendations: Dysphagia 1 (puree);Pudding-thick liquid Liquids provided via: Teaspoon Medication Administration: Crushed with puree Supervision: Patient able to self feed;Full supervision/cueing for compensatory strategies Compensations: Minimize environmental distractions;Slow rate;Small sips/bites;Multiple dry swallows after each bite/sip Postural Changes and/or Swallow Maneuvers: Seated upright 90 degrees                Oral Care Recommendations: Oral care BID Follow up Recommendations: Skilled Nursing facility;Inpatient Rehab SLP Visit Diagnosis: Dysphagia, pharyngeal phase (R13.13) Plan: MBS       GO                [name] 10/27/2016, 3:00 PM   [name] Colvin Caroli.Ed [employer] [phone]

## 2016-10-27 NOTE — Progress Notes (Addendum)
I met with pt at bedside to discuss goals and expectations of a possible inpt rehab admission. Pt states he prefers [facility] in [place] for he wants to be close to his home and his wife has Alzheimer's and can visit him daily in [place]. I discussed the need for his Cortrak and medical follow up that may not be provided at  [facility]. He states he still prefers [facility] and if unable to be admitted there, He would consider inpt rehab. I will follow and request from pt if I can discuss with his children . [phone]

## 2016-10-27 NOTE — Progress Notes (Signed)
Patient ID: [name], male   DOB: 19-Apr-1930, 83 y.o.   MRN: [id number] EVENING ROUNDS NOTE :     [address]       [place] [address]             [phone]                 18 Days Post-Op Procedure(s) (LRB): CORONARY ARTERY BYPASS GRAFTING (CABG) x two, using left internal mammary artery and right leg greater saphenous vein harvested endoscopically (N/A) MAZE (N/A) TRANSESOPHAGEAL ECHOCARDIOGRAM (TEE) (N/A) CLIPPING OF ATRIAL APPENDAGE  Total Length of Stay:  LOS: 18 days  BP (!) 107/51   Pulse 82   Temp (!) 97.5 F (36.4 C) (Oral)   Resp 18   Ht 5\' 8"  (1.727 m)   Wt 203 lb 14.8 oz (92.5 kg)   SpO2 96%   BMI 31.01 kg/m   .Intake/Output      10/14 0701 - 10/15 0700 10/15 0701 - 10/16 0700   P.O. 30 270   I.V. (mL/kg)  100 (1.1)   Blood  1149.5   NG/GT 1065.8 70   IV Piggyback 250    Total Intake(mL/kg) 1345.8 (14.4) 1589.5 (17.2)   Other  1750   Stool 625    Total Output 625 1750   Net +720.8 -160.5        Urine Occurrence 1 x      . sodium chloride    . sodium chloride    . feeding supplement (VITAL 1.5 CAL)    . heparin 999 mL/hr at 10/21/16 1705     Lab Results  Component Value Date   WBC 10.5 10/27/2016   HGB 11.2 (L) 10/27/2016   HCT 33.0 (L) 10/27/2016   PLT 193 10/27/2016   GLUCOSE 66 10/27/2016   CHOL 179 08/20/2014   TRIG 105 08/20/2014   HDL 30 (L) 08/20/2014   LDLCALC 128 (H) 08/20/2014   ALT 143 (H) 10/23/2016   AST 54 (H) 10/23/2016   NA 137 10/27/2016   K 3.5 10/27/2016   CL 96 (L) 10/27/2016   CREATININE 1.60 (H) 10/27/2016   BUN 20 10/27/2016   CO2 29 10/27/2016   TSH 6.623 (H) 10/01/2016   INR 1.22 10/27/2016   HGBA1C 7.2 (H) 10/06/2016   Very slow progress, but alert and does not like "gound up food"  Grace Isaac MD  Beeper [phone] Office [phone] 10/27/2016 6:03 PM

## 2016-10-27 NOTE — Progress Notes (Signed)
Potassium 3.2; last lab yesterday at 1600 was 3.1; 4 runs given yesterday afternoon/evening; attempted to call HD to see when they would come today for HD; patient is having large quantities of diarrhea consistently via rectal tube; renal MD notified; will address in AM.  Will continue to monitor.  Clyda Hurdle RN

## 2016-10-27 NOTE — Progress Notes (Signed)
PT Cancellation Note  Patient Details Name: SEYMOUR PAVLAK MRN: [id number] DOB: 08-Jan-1931   Cancelled Treatment:    Reason Eval/Treat Not Completed: Patient at procedure or test/unavailable. Pt undergoing dialysis at bedside. PT to return as able to complete treatment.   Geneal Huebert M Ruta Capece 10/27/2016, 11:13 AM   Kittie Plater, PT, DPT Pager #: [phone] Office #: [phone]

## 2016-10-28 ENCOUNTER — Encounter (HOSPITAL_COMMUNITY): Payer: Self-pay | Admitting: Surgery

## 2016-10-28 ENCOUNTER — Inpatient Hospital Stay (HOSPITAL_COMMUNITY): Payer: Medicare Other | Admitting: Anesthesiology

## 2016-10-28 ENCOUNTER — Inpatient Hospital Stay (HOSPITAL_COMMUNITY): Payer: Medicare Other

## 2016-10-28 ENCOUNTER — Encounter (HOSPITAL_COMMUNITY)
Admission: RE | Disposition: A | Discharge status: Rehabilitation Facility | Payer: Self-pay | Source: Ambulatory Visit | Attending: Thoracic Surgery (Cardiothoracic Vascular Surgery)

## 2016-10-28 DIAGNOSIS — N185 Chronic kidney disease, stage 5: Secondary | ICD-10-CM | POA: Diagnosis not present

## 2016-10-28 HISTORY — PX: INSERTION OF DIALYSIS CATHETER: SHX1324

## 2016-10-28 HISTORY — PX: AV FISTULA PLACEMENT: SHX1204

## 2016-10-28 LAB — CBC
HEMATOCRIT: 31.6 % — AB (ref 39.0–52.0)
HEMOGLOBIN: 10.3 g/dL — AB (ref 13.0–17.0)
MCH: 29.8 pg (ref 26.0–34.0)
MCHC: 32.6 g/dL (ref 30.0–36.0)
MCV: 91.3 fL (ref 78.0–100.0)
Platelets: 215 10*3/uL (ref 150–400)
RBC: 3.46 MIL/uL — ABNORMAL LOW (ref 4.22–5.81)
RDW: 18 % — ABNORMAL HIGH (ref 11.5–15.5)
WBC: 12.5 10*3/uL — ABNORMAL HIGH (ref 4.0–10.5)

## 2016-10-28 LAB — COMPREHENSIVE METABOLIC PANEL
ALBUMIN: 2.2 g/dL — AB (ref 3.5–5.0)
ALK PHOS: 144 U/L — AB (ref 38–126)
ALT: 41 U/L (ref 17–63)
AST: 21 U/L (ref 15–41)
Anion gap: 10 (ref 5–15)
BUN: 37 mg/dL — AB (ref 6–20)
CHLORIDE: 100 mmol/L — AB (ref 101–111)
CO2: 27 mmol/L (ref 22–32)
Calcium: 8.1 mg/dL — ABNORMAL LOW (ref 8.9–10.3)
Creatinine, Ser: 2.96 mg/dL — ABNORMAL HIGH (ref 0.61–1.24)
GFR calc Af Amer: 21 mL/min — ABNORMAL LOW (ref >60)
GFR calc non Af Amer: 18 mL/min — ABNORMAL LOW (ref >60)
GLUCOSE: 78 mg/dL (ref 65–99)
POTASSIUM: 3.8 mmol/L (ref 3.5–5.1)
SODIUM: 137 mmol/L (ref 135–145)
Total Bilirubin: 0.6 mg/dL (ref 0.3–1.2)
Total Protein: 5.5 g/dL — ABNORMAL LOW (ref 6.5–8.1)

## 2016-10-28 LAB — HEPATITIS B CORE ANTIBODY, TOTAL: Hep B Core Total Ab: POSITIVE — AB

## 2016-10-28 LAB — TYPE AND SCREEN
ABO/RH(D): A POS
ANTIBODY SCREEN: NEGATIVE
Unit division: 0
Unit division: 0

## 2016-10-28 LAB — BPAM RBC
BLOOD PRODUCT EXPIRATION DATE: 201811062359
Blood Product Expiration Date: 201811062359
ISSUE DATE / TIME: 201810151143
ISSUE DATE / TIME: 201810151143
UNIT TYPE AND RH: 6200
Unit Type and Rh: 6200

## 2016-10-28 LAB — GLUCOSE, CAPILLARY
GLUCOSE-CAPILLARY: 71 mg/dL (ref 65–99)
GLUCOSE-CAPILLARY: 78 mg/dL (ref 65–99)
GLUCOSE-CAPILLARY: 186 mg/dL — AB (ref 65–99)
Glucose-Capillary: 187 mg/dL — ABNORMAL HIGH (ref 65–99)
Glucose-Capillary: 69 mg/dL (ref 65–99)
Glucose-Capillary: 71 mg/dL (ref 65–99)
Glucose-Capillary: 71 mg/dL (ref 65–99)

## 2016-10-28 LAB — PROTIME-INR
INR: 1.21
Prothrombin Time: 15.2 seconds (ref 11.4–15.2)

## 2016-10-28 LAB — HEPATITIS B SURFACE ANTIBODY,QUALITATIVE: Hep B S Ab: NONREACTIVE

## 2016-10-28 LAB — APTT: aPTT: 29 seconds (ref 24–36)

## 2016-10-28 LAB — PREALBUMIN: Prealbumin: 17.8 mg/dL — ABNORMAL LOW (ref 18–38)

## 2016-10-28 LAB — PHOSPHORUS: Phosphorus: 4 mg/dL (ref 2.5–4.6)

## 2016-10-28 LAB — MAGNESIUM: Magnesium: 2 mg/dL (ref 1.7–2.4)

## 2016-10-28 SURGERY — ARTERIOVENOUS (AV) FISTULA CREATION
Anesthesia: Monitor Anesthesia Care | Site: Arm Lower | Laterality: Right

## 2016-10-28 MED ORDER — PROPOFOL 500 MG/50ML IV EMUL
INTRAVENOUS | Status: DC | PRN
  Administered 2016-10-28: 50 ug/kg/min via INTRAVENOUS

## 2016-10-28 MED ORDER — INSULIN DETEMIR 100 UNIT/ML ~~LOC~~ SOLN
20.0000 [IU] | Freq: Every day | SUBCUTANEOUS | Status: DC
  Filled 2016-10-28: qty 0.2

## 2016-10-28 MED ORDER — PHENYLEPHRINE HCL 10 MG/ML IJ SOLN
INTRAMUSCULAR | Status: DC | PRN
  Administered 2016-10-28 (×3): 40 ug via INTRAVENOUS
  Administered 2016-10-28: 80 ug via INTRAVENOUS

## 2016-10-28 MED ORDER — FENTANYL CITRATE (PF) 100 MCG/2ML IJ SOLN
INTRAMUSCULAR | Status: DC | PRN
  Administered 2016-10-28: 50 ug via INTRAVENOUS
  Administered 2016-10-28 (×2): 25 ug via INTRAVENOUS

## 2016-10-28 MED ORDER — LIDOCAINE HCL 1 % IJ SOLN
INTRAMUSCULAR | Status: AC
  Filled 2016-10-28: qty 20

## 2016-10-28 MED ORDER — WARFARIN - PHYSICIAN DOSING INPATIENT
Freq: Every day | Status: DC
  Administered 2016-10-28 – 2016-10-29 (×2)

## 2016-10-28 MED ORDER — TRAZODONE HCL 50 MG PO TABS
25.0000 mg | ORAL_TABLET | Freq: Every day | ORAL | Status: DC
  Administered 2016-10-28: 25 mg
  Filled 2016-10-28: qty 1

## 2016-10-28 MED ORDER — 0.9 % SODIUM CHLORIDE (POUR BTL) OPTIME
TOPICAL | Status: DC | PRN
  Administered 2016-10-28: 1000 mL

## 2016-10-28 MED ORDER — CAMPHOR-MENTHOL 0.5-0.5 % EX LOTN
TOPICAL_LOTION | CUTANEOUS | Status: DC | PRN
  Administered 2016-10-29: 22:00:00 via TOPICAL
  Filled 2016-10-28: qty 222

## 2016-10-28 MED ORDER — DEXTROSE 5 % IV SOLN
INTRAVENOUS | Status: AC
  Filled 2016-10-28: qty 1.5

## 2016-10-28 MED ORDER — LIDOCAINE HCL (PF) 1 % IJ SOLN
INTRAMUSCULAR | Status: DC | PRN
  Administered 2016-10-28 (×2): 30 mL

## 2016-10-28 MED ORDER — PHENYLEPHRINE HCL 10 MG/ML IJ SOLN
INTRAVENOUS | Status: DC | PRN
  Administered 2016-10-28: 40 ug/min via INTRAVENOUS

## 2016-10-28 MED ORDER — ONDANSETRON HCL 4 MG/2ML IJ SOLN
INTRAMUSCULAR | Status: AC
Start: 2016-10-28 — End: 2016-10-28
  Administered 2016-10-28: 4 mg via INTRAVENOUS
  Filled 2016-10-28: qty 2

## 2016-10-28 MED ORDER — HEPARIN SODIUM (PORCINE) 1000 UNIT/ML IJ SOLN
INTRAMUSCULAR | Status: DC | PRN
  Administered 2016-10-28: 7000 [IU] via INTRAVENOUS

## 2016-10-28 MED ORDER — FENTANYL CITRATE (PF) 250 MCG/5ML IJ SOLN
INTRAMUSCULAR | Status: AC
  Filled 2016-10-28: qty 5

## 2016-10-28 MED ORDER — HEPARIN SODIUM (PORCINE) 1000 UNIT/ML IJ SOLN
INTRAMUSCULAR | Status: AC
  Filled 2016-10-28: qty 1

## 2016-10-28 MED ORDER — PROPOFOL 10 MG/ML IV BOLUS
INTRAVENOUS | Status: DC | PRN
Start: 2016-10-28 — End: 2016-10-28
  Administered 2016-10-28: 20 mg via INTRAVENOUS
  Administered 2016-10-28: 10 mg via INTRAVENOUS
  Administered 2016-10-28 (×2): 20 mg via INTRAVENOUS
  Administered 2016-10-28 (×2): 10 mg via INTRAVENOUS

## 2016-10-28 MED ORDER — SODIUM CHLORIDE 0.9 % IV SOLN
INTRAVENOUS | Status: DC | PRN
  Administered 2016-10-28: 500 mL

## 2016-10-28 MED ORDER — PROPOFOL 10 MG/ML IV BOLUS
INTRAVENOUS | Status: AC
  Filled 2016-10-28: qty 20

## 2016-10-28 MED ORDER — DEXTROSE 5 % IV SOLN
INTRAVENOUS | Status: DC | PRN
  Administered 2016-10-28: 1.5 g via INTRAVENOUS

## 2016-10-28 MED ORDER — PROTAMINE SULFATE 10 MG/ML IV SOLN
INTRAVENOUS | Status: AC
  Filled 2016-10-28: qty 5

## 2016-10-28 MED ORDER — LIDOCAINE HCL (PF) 1 % IJ SOLN
INTRAMUSCULAR | Status: AC
  Filled 2016-10-28: qty 30

## 2016-10-28 MED ORDER — PROTAMINE SULFATE 10 MG/ML IV SOLN
INTRAVENOUS | Status: DC | PRN
  Administered 2016-10-28: 30 mg via INTRAVENOUS

## 2016-10-28 MED ORDER — WARFARIN SODIUM 2.5 MG PO TABS
2.5000 mg | ORAL_TABLET | Freq: Every day | ORAL | Status: DC
  Administered 2016-10-28 – 2016-10-29 (×2): 2.5 mg
  Filled 2016-10-28 (×2): qty 1

## 2016-10-28 MED ORDER — HEPARIN SODIUM (PORCINE) 1000 UNIT/ML IJ SOLN
INTRAMUSCULAR | Status: DC | PRN
  Administered 2016-10-28: 1000 [IU]

## 2016-10-28 MED ORDER — SODIUM CHLORIDE 0.9 % IV SOLN
INTRAVENOUS | Status: DC | PRN
  Administered 2016-10-28 (×2): via INTRAVENOUS

## 2016-10-28 MED ORDER — LIDOCAINE HCL (CARDIAC) 20 MG/ML IV SOLN
INTRAVENOUS | Status: DC | PRN
  Administered 2016-10-28: 20 mg via INTRATRACHEAL

## 2016-10-28 MED ORDER — LIDOCAINE 2% (20 MG/ML) 5 ML SYRINGE
INTRAMUSCULAR | Status: AC
  Filled 2016-10-28: qty 10

## 2016-10-28 MED ORDER — PHENYLEPHRINE HCL 10 MG/ML IJ SOLN
INTRAMUSCULAR | Status: AC
  Filled 2016-10-28: qty 1

## 2016-10-28 MED ORDER — ENOXAPARIN SODIUM 30 MG/0.3ML ~~LOC~~ SOLN
30.0000 mg | SUBCUTANEOUS | Status: DC
  Administered 2016-10-29 – 2016-11-03 (×4): 30 mg via SUBCUTANEOUS
  Filled 2016-10-28 (×5): qty 0.3

## 2016-10-28 MED ORDER — MORPHINE SULFATE (PF) 4 MG/ML IV SOLN: 1.0000 mg | INTRAVENOUS | Status: DC | PRN

## 2016-10-28 SURGICAL SUPPLY — 57 items
ADH SKN CLS APL DERMABOND .7 (GAUZE/BANDAGES/DRESSINGS) ×4
ARMBAND PINK RESTRICT EXTREMIT (MISCELLANEOUS) ×6 IMPLANT
BAG DECANTER FOR FLEXI CONT (MISCELLANEOUS) ×2 IMPLANT
BIOPATCH RED 1 DISK 7.0 (GAUZE/BANDAGES/DRESSINGS) ×3 IMPLANT
CANISTER SUCT 3000ML PPV (MISCELLANEOUS) ×3 IMPLANT
CANNULA VESSEL 3MM 2 BLNT TIP (CANNULA) ×3 IMPLANT
CATH PALINDROME RT-P 15FX19CM (CATHETERS) IMPLANT
CATH PALINDROME RT-P 15FX23CM (CATHETERS) ×1 IMPLANT
CATH PALINDROME RT-P 15FX28CM (CATHETERS) IMPLANT
CATH PALINDROME RT-P 15FX55CM (CATHETERS) IMPLANT
CHLORAPREP W/TINT 26ML (MISCELLANEOUS) ×3 IMPLANT
CLIP VESOCCLUDE MED 6/CT (CLIP) ×3 IMPLANT
CLIP VESOCCLUDE SM WIDE 6/CT (CLIP) ×3 IMPLANT
COVER PROBE W GEL 5X96 (DRAPES) ×3 IMPLANT
COVER SURGICAL LIGHT HANDLE (MISCELLANEOUS) ×3 IMPLANT
DECANTER SPIKE VIAL GLASS SM (MISCELLANEOUS) ×3 IMPLANT
DERMABOND ADVANCED (GAUZE/BANDAGES/DRESSINGS) ×2
DERMABOND ADVANCED .7 DNX12 (GAUZE/BANDAGES/DRESSINGS) ×2 IMPLANT
DRAPE C-ARM 42X72 X-RAY (DRAPES) ×3 IMPLANT
DRAPE CHEST BREAST 15X10 FENES (DRAPES) ×4 IMPLANT
ELECT REM PT RETURN 9FT ADLT (ELECTROSURGICAL) ×3
ELECTRODE REM PT RTRN 9FT ADLT (ELECTROSURGICAL) ×2 IMPLANT
GAUZE SPONGE 4X4 16PLY XRAY LF (GAUZE/BANDAGES/DRESSINGS) ×3 IMPLANT
GLOVE BIO SURGEON STRL SZ7.5 (GLOVE) ×4 IMPLANT
GLOVE BIOGEL PI IND STRL 6.5 (GLOVE) IMPLANT
GLOVE BIOGEL PI IND STRL 8 (GLOVE) ×2 IMPLANT
GLOVE BIOGEL PI INDICATOR 6.5 (GLOVE) ×4
GLOVE BIOGEL PI INDICATOR 8 (GLOVE) ×2
GLOVE SKINSENSE STRL SZ6.0 (GLOVE) ×1 IMPLANT
GOWN STRL REUS W/ TWL LRG LVL3 (GOWN DISPOSABLE) ×6 IMPLANT
GOWN STRL REUS W/TWL LRG LVL3 (GOWN DISPOSABLE) ×15
GUIDEWIRE ANGLED .035X150CM (WIRE) ×1 IMPLANT
KIT BASIN OR (CUSTOM PROCEDURE TRAY) ×3 IMPLANT
KIT ROOM TURNOVER OR (KITS) ×3 IMPLANT
NDL 18GX1X1/2 (RX/OR ONLY) (NEEDLE) ×2 IMPLANT
NDL HYPO 25GX1X1/2 BEV (NEEDLE) ×2 IMPLANT
NEEDLE 18GX1X1/2 (RX/OR ONLY) (NEEDLE) ×3 IMPLANT
NEEDLE HYPO 25GX1X1/2 BEV (NEEDLE) ×3 IMPLANT
NS IRRIG 1000ML POUR BTL (IV SOLUTION) ×3 IMPLANT
PACK CV ACCESS (CUSTOM PROCEDURE TRAY) ×3 IMPLANT
PACK SURGICAL SETUP 50X90 (CUSTOM PROCEDURE TRAY) ×3 IMPLANT
PAD ARMBOARD 7.5X6 YLW CONV (MISCELLANEOUS) ×6 IMPLANT
SET MICROPUNCTURE 5F STIFF (MISCELLANEOUS) ×1 IMPLANT
SPONGE SURGIFOAM ABS GEL 100 (HEMOSTASIS) IMPLANT
SUT ETHILON 3 0 PS 1 (SUTURE) ×3 IMPLANT
SUT PROLENE 6 0 BV (SUTURE) ×4 IMPLANT
SUT VIC AB 3-0 SH 27 (SUTURE) ×3
SUT VIC AB 3-0 SH 27X BRD (SUTURE) ×2 IMPLANT
SUT VICRYL 4-0 PS2 18IN ABS (SUTURE) ×3 IMPLANT
SYR 10ML LL (SYRINGE) ×3 IMPLANT
SYR 20CC LL (SYRINGE) ×6 IMPLANT
SYR 5ML LL (SYRINGE) ×6 IMPLANT
SYR CONTROL 10ML LL (SYRINGE) ×3 IMPLANT
TOWEL GREEN STERILE (TOWEL DISPOSABLE) ×3 IMPLANT
TOWEL GREEN STERILE FF (TOWEL DISPOSABLE) ×3 IMPLANT
UNDERPAD 30X30 (UNDERPADS AND DIAPERS) ×3 IMPLANT
WATER STERILE IRR 1000ML POUR (IV SOLUTION) ×3 IMPLANT

## 2016-10-28 NOTE — Op Note (Signed)
NAME: [name]    MRN: [id number] DOB: 03/11/1930    DATE OF OPERATION: 10/28/2016  PREOP DIAGNOSIS:    End-stage renal disease  POSTOP DIAGNOSIS:    Same  PROCEDURE:    1. Ultrasound-guided placement of right IJ 23 cm tunneled dialysis catheter 2. Right brachiocephalic AV fistula  SURGEON: Judeth Cornfield. Scot Dock, MD, FACS  ASSIST: Gerri Lins PA  ANESTHESIA: local with sedation   EBL: minimal  INDICATIONS:    [name] is a 83 y.o. male who presents for hemodialysis access. He had a triple-lumen catheter in his right IJ. He had a left subclavian vein temporary dialysis catheter.  FINDINGS:    the right IJ appears to be occluded. I attempted to exchange his left subclavian vein temporary catheter over a wire however he had significant pain with dilation and therefore I did not persist. I was concerned that there was not much space between the clavicle and first rib and for this reason the catheter would not function well. This reason I placed a right IJ tunneled dialysis catheter.  TECHNIQUE:    The patient was taken to the operative room and sedated by anesthesia. The left neck and upper chest were prepped and draped in usual sterile fashion. The patient previously had attempted left IJ catheter placement and I believe this was unsuccessful and a left subclavian vein catheter was placed. However by ultrasound and could identify small vein in the neck and I attempted to cannulate this under ultrasound guidance after the skin was anesthetized. However the wire would not pass and therefore I decided to change the right subclavian catheter over a wire. The catheter was divided and then a wire passed to the catheter down into the right atrium. The catheter was then removed. In dilating the tract the patient experience pain in tunneling and the space between the clavicle and first rib was fairly tight. This reason I was concerned that placing a catheter in this location  would not twice a day he'll given that the catheter might be pinched between the clavicle and first rib. This reason I removed the wire and held pressure for hemostasis. I elected to place a right IJ tunneled dialysis catheter.  The line in the right neck was fairly high and I did not think this could simply be changed over wire. Therefore this catheter was removed and pressure held for hemostasis. The right neck and upper chest were then prepped and draped in usual sterile fashion. Under ultrasound guidance, after the skin was anesthetized, the right IJ was cannulated and a guidewire introduced into the superior cava under fluoroscopic control. The tract over the wire was dilated and the dilator and peel-away sheath were advanced over the wire and the wire and dilator were removed. The 23 cm catheter was passed through the peel-away sheath and positioned in the right atrium. The excised the cath was selected and the skin anesthetized between the 2 areas the catheter was brought to the tunnel cut the appropriate length and the distal ports were attached. Both ports withdrew easily with an flushed with heparin saline and filled with concentrated heparin. The catheter was secured at its exit site with a 3-0 nylon suture. The IJ cannulation site was closed with a 40 septic or stitch. Sterile dressing was applied.  Attention was then turned to the right arm. The right upper extremity was prepped and draped in the usual sterile fashion. After the skin was anesthetized with 1 lidocaine, a  transverse incision was made just above the antecubital level. Here the cephalic vein was dissected free. Branches were divided between clips and 3-0 silk ties. The vein was ligated distally and irrigated up with heparinized saline. It was about a 4 mm vein. The brachial artery was dissected free beneath the fascia. The patient was heparinized.The brachial artery was clamped proximally and distally and longitudinal arteriotomy was  made. The vein was sewn into side to the artery using continuous 6-0 Prolene suture. At the completion was a good thrill in the fistula and a good ulnar signal with the Doppler. The patient had no preoperative radial pulse. The heparin was partially reversed with protamine. The wound was then closed with a deep 3-0 Vicryl and the skin closed with 4-0 Vicryl. Dermabond was applied. The patient tolerated the procedure well and was transferred to the recovery room in stable condition. All needle and sponge counts were correct.  [name], MD, FACS [facility]  DATE OF DICTATION:   10/28/2016

## 2016-10-28 NOTE — Progress Notes (Signed)
[facility] BRIEF SICU PROGRESS NOTE  Day of Surgery  S/P Procedure(s) (LRB): ARTERIOVENOUS  FISTULA CREATION right arm (Right) INSERTION OF DIALYSIS CATHETER (N/A)   Just got back from OR Awake and alert, no complaints NSR w/ stable BP Breathing comfortably on RA Dressings dry, intact Right hand warm, adequately perfused  Plan: Continue current plan.  Resume tube feeds.  Resume Coumadin  Rexene Alberts, MD 10/28/2016 5:39 PM

## 2016-10-28 NOTE — Progress Notes (Signed)
OT Cancellation Note  Patient Details Name: [name] MRN: [id number] DOB: 1930/06/27   Cancelled Treatment:    Reason Eval/Treat Not Completed: Other (comment): Pt reporting concern for procedure later this am and that he is not feeling well. Pt declining to participate in mobility or ADL at this time. Pt educated and encouraged by OT, PT, SLP, and RN concerning benefits of participation; however, pt continued to decline participation. Will check back as able to initiate OT evaluation.  Norman Herrlich, MS OTR/L  Pager: [name] 10/28/2016, 9:59 AM

## 2016-10-28 NOTE — Progress Notes (Signed)
  Speech Language Pathology Treatment: Dysphagia  Patient Details Name: [name] MRN: [id number] DOB: [birth year] Today's Date: 10/28/2016 Time: 0902-0910 SLP Time Calculation (min) (ACUTE ONLY): 8 min  Assessment / Plan / Recommendation Clinical Impression  MBS was scheduled for this morning however he is going for surgery this morning and is NPO. SLP updated pt/family. Answered daughter's questions re: test, possible outcomes etc. Confirmed with RN that pt may have several ice chips (after he requested). Consumed 4 chips with minimal delayed throat clearing. Will reschedule MBS for tomorrow morning.    HPI HPI: Patient is an 83 year old male with history of hypertension, COPD, GERD, pna, chronic diastolic congestive heart failure, stage IV chronic kidney disease, hyperlipidemia, recurrent paroxysmal atrial fibrillation, bronchiectasis, asthma, rheumatoid arthritis, type 2 diabetes mellitus, and anemia. Underwent CABG 9/27. Intubated 9/27-9/28, 10/3-10/8. CXR RIGHT pigtail catheter no longer seen. No pneumothorax. Slight improvement in pulmonary edema.      SLP Plan  MBS (10/17)       Recommendations  Diet recommendations: Dysphagia 1 (puree);Pudding-thick liquid Medication Administration: Crushed with puree Supervision: Patient able to self feed;Full supervision/cueing for compensatory strategies Compensations: Slow rate;Small sips/bites;Multiple dry swallows after each bite/sip Postural Changes and/or Swallow Maneuvers: Seated upright 90 degrees                Oral Care Recommendations: Oral care BID Follow up Recommendations: Skilled Nursing facility SLP Visit Diagnosis: Dysphagia, pharyngeal phase (R13.13) Plan: MBS (10/17)       GO                [name] 10/28/2016, 9:21 AM  [name] Colvin Caroli.Ed [employer] [phone]

## 2016-10-28 NOTE — Transfer of Care (Signed)
Immediate Anesthesia Transfer of Care Note  Patient: Irena Cords  Procedure(s) Performed: ARTERIOVENOUS  FISTULA CREATION right arm (Right Arm Lower) INSERTION OF DIALYSIS CATHETER (N/A )  Patient Location: PACU  Anesthesia Type:MAC  Level of Consciousness: awake and patient cooperative  Airway & Oxygen Therapy: Patient Spontanous Breathing  Post-op Assessment: Report given to RN and Post -op Vital signs reviewed and stable  Post vital signs: Reviewed and stable  Last Vitals:  Vitals:   10/28/16 1000 10/28/16 1100  BP: (!) 111/52 (!) 112/49  Pulse: 87 85  Resp: 19 15  Temp:    SpO2: 96% 95%    Last Pain:  Vitals:   10/28/16 1100  TempSrc:   PainSc: 0-No pain      Patients Stated Pain Goal: 0 (40/98/11 9147)  Complications: No apparent anesthesia complications

## 2016-10-28 NOTE — Progress Notes (Signed)
PT Cancellation Note  Patient Details Name: ISAY PERLEBERG MRN: [id number] DOB: 1930/04/26   Cancelled Treatment:    Reason Eval/Treat Not Completed: Other (comment) (Refused adamantly with max encouragement. )   Godfrey Pick Kerrie Timm 10/28/2016, 11:11 AM  [facility] [phone] (pager)

## 2016-10-28 NOTE — Progress Notes (Signed)
Patient ID: [name], male   DOB: 04/03/1930, 83 y.o.   MRN: [id number] S:C/o poor sleep again last night and Itching on his back.  He would like his sleeping pill tonight. O:BP (!) 96/58   Pulse 88   Temp (!) 97.5 F (36.4 C) (Oral)   Resp 20   Ht _0  (1.727 m)   Wt 91.1 kg (200 lb 13.4 oz)   SpO2 94%   BMI 30.54 kg/m   Intake/Output Summary (Last 24 hours) at 10/28/16 0826 Last data filed at 10/28/16 0400  Gross per 24 hour  Intake          1834.17 ml  Output             2201 ml  Net          -366.83 ml   Intake/Output: I/O last 3 completed shifts: In: 2620.8 [P.O.:300; I.V.:100; Blood:1149.5; NG/GT:921.3; IV Piggyback:150] Out: 2701 [Other:1750; Stool:951]  Intake/Output this shift:  No intake/output data recorded. Weight change: 1.4 kg (3 lb 1.4 oz) Gen:NAD CVS: no rub Resp:cta NTI:RWERXV Ext: no edema   Recent Labs Lab 10/22/16 0315 10/22/16 1512  10/23/16 0441  10/23/16 1552  10/24/16 0725  10/24/16 1600 10/25/16 0428 10/25/16 1509 10/26/16 0348 10/26/16 1621 10/27/16 0400 10/27/16 1624 10/27/16 1632 10/28/16 0526  NA 139 138  < > 139  < > 139  --  142  < > 142  --  137  --  137 135 136 137 137  K 3.3* 3.5  < > 3.4*  < > 3.4*  --  3.3*  < > 3.0*  --  3.1*  --  3.1* 3.2* 3.4* 3.5 3.8  CL 102 104  < > 103  < > 103  --  109  < > 108  --  105  --  96* 100* 99* 96* 100*  CO2 27 26  --  24  --  23  --  21*  --  23  --  23  --   --  25 29  --  27  GLUCOSE 165* 158*  < > 197*  < > 147*  --  188*  < > 130*  --  186*  --  60* 131* 70 66 78  BUN 38* 37*  < > 45*  < > 57*  --  75*  < > 83*  --  99*  --  56* 72* 20 20 37*  CREATININE 1.76* 1.76*  < > 1.96*  < > 2.36*  --  3.20*  < > 3.47*  --  4.01*  --  3.30* 3.87* 1.81* 1.60* 2.96*  ALBUMIN 1.9* 2.0*  --  2.1*  --  2.2*  --   --   --  2.0*  --  2.0*  --   --   --  2.2*  --  2.2*  CALCIUM 7.8* 7.7*  --  8.1*  --  8.1*  --  8.1*  --  8.0*  --  7.7*  --   --  7.8* 7.6*  --  8.1*  PHOS 2.0* 1.1*  --  1.6*  --   2.1*  < >  --   --  2.2* 3.1 2.8 2.4*  --  3.1 1.7*  --  4.0  AST 76*  --   --  54*  --   --   --   --   --   --   --   --   --   --   --   --   --  21  ALT 194*  --   --  143*  --   --   --   --   --   --   --   --   --   --   --   --   --  41  < > = values in this interval not displayed. Liver Function Tests:  Recent Labs Lab 10/22/16 0315  10/23/16 0441  10/25/16 1509 10/27/16 1624 10/28/16 0526  AST 76*  --  54*  --   --   --  21  ALT 194*  --  143*  --   --   --  41  ALKPHOS 270*  --  287*  --   --   --  144*  BILITOT 0.9  --  1.1  --   --   --  0.6  PROT 4.9*  --  5.3*  --   --   --  5.5*  ALBUMIN 1.9*  < > 2.1*  < > 2.0* 2.2* 2.2*  < > = values in this interval not displayed. No results for input(s): LIPASE, AMYLASE in the last 168 hours. No results for input(s): AMMONIA in the last 168 hours. CBC:  Recent Labs Lab 10/23/16 0441  10/24/16 0725  10/25/16 1527  10/27/16 0400 10/27/16 1632 10/28/16 0526  WBC 13.9*  --  19.7*  --  12.8*  --  10.5  --  12.5*  HGB 8.9*  < > 8.4*  < > 7.9*  < > 7.3* 11.2* 10.3*  HCT 28.0*  < > 26.5*  < > 25.0*  < > 22.5* 33.0* 31.6*  MCV 91.8  --  93.3  --  94.0  --  93.4  --  91.3  PLT 193  --  198  --  175  --  193  --  215  < > = values in this interval not displayed. Cardiac Enzymes: No results for input(s): CKTOTAL, CKMB, CKMBINDEX, TROPONINI in the last 168 hours. CBG:  Recent Labs Lab 10/27/16 1626 10/27/16 2011 10/27/16 2346 10/28/16 0540 10/28/16 0757  GLUCAP 76 145* 187* 71 69    Iron Studies: No results for input(s): IRON, TIBC, TRANSFERRIN, FERRITIN in the last 72 hours. Studies/Results: Dg Chest Port 1 View  Result Date: 10/27/2016 CLINICAL DATA:  Chest pain.  Recent coronary artery bypass grafting EXAM: PORTABLE CHEST 1 VIEW COMPARISON:  October 22, 2016 FINDINGS: Right jugular catheter tip is unchanged in position near the junction of the right jugular vein and superior vena cava. Left subclavian catheter tip is  in the superior vena cava. Feeding tube tip is below the diaphragm. No pneumothorax. There is slightly less interstitial edema compared to prior study. There are areas of patchy atelectasis in both mid and lower lung zones. There is no frank airspace consolidation. There is cardiomegaly with mild pulmonary venous hypertension. There is aortic atherosclerosis. Total shoulder replacement noted on the left. Left atrial appendage clamp present. Patient is status post coronary artery bypass grafting. There are surgical clips in the right cervicothoracic junction region. There is left sided carotid artery calcification. IMPRESSION: Tube and catheter positions as described without evident pneumothorax. Evidence a degree of congestive heart failure with less edema compared to most recent study. Areas of patchy atelectasis without evidence of frank airspace consolidation. Cardiac silhouette is stable. There is aortic atherosclerosis. Aortic Atherosclerosis (ICD10-I70.0). Electronically Signed   By: Lowella Grip III M.D.   On: 10/27/2016 07:30   . amiodarone  400 mg Per Tube Q12H  . aspirin  81 mg Per Tube Daily  . chlorhexidine gluconate (MEDLINE KIT)  15 mL Mouth Rinse BID  . Chlorhexidine Gluconate Cloth  6 each Topical Q0600  . enoxaparin (LOVENOX) injection  30 mg Subcutaneous Q48H  . feeding supplement (PRO-STAT SUGAR FREE 64)  30 mL Per Tube TID  . folic acid  1 mg Per Tube Daily  . Gerhardt's butt cream   Topical BID  . insulin aspart  0-24 Units Subcutaneous Q4H  . insulin detemir  20 Units Subcutaneous BID  . levothyroxine  25 mcg Per Tube QAC breakfast  . mouth rinse  15 mL Mouth Rinse q12n4p  . moving right along book   Does not apply Once  . multivitamin  15 mL Per Tube Daily  . pantoprazole sodium  40 mg Per Tube Daily  . patient's guide to using coumadin book   Does not apply Once  . sodium chloride flush  10-40 mL Intracatheter Q12H  . sodium chloride flush  3 mL Intravenous Q12H  .  sodium chloride flush  3 mL Intravenous Q12H  . Warfarin - Physician Dosing Inpatient   Does not apply q1800    BMET    Component Value Date/Time   NA 137 10/28/2016 0526   K 3.8 10/28/2016 0526   CL 100 (L) 10/28/2016 0526   CO2 27 10/28/2016 0526   GLUCOSE 78 10/28/2016 0526   BUN 37 (H) 10/28/2016 0526   CREATININE 2.96 (H) 10/28/2016 0526   CALCIUM 8.1 (L) 10/28/2016 0526   GFRNONAA 18 (L) 10/28/2016 0526   GFRAA 21 (L) 10/28/2016 0526   CBC    Component Value Date/Time   WBC 12.5 (H) 10/28/2016 0526   RBC 3.46 (L) 10/28/2016 0526   HGB 10.3 (L) 10/28/2016 0526   HCT 31.6 (L) 10/28/2016 0526   PLT 215 10/28/2016 0526   MCV 91.3 10/28/2016 0526   MCH 29.8 10/28/2016 0526   MCHC 32.6 10/28/2016 0526   RDW 18.0 (H) 10/28/2016 0526   LYMPHSABS 1.1 10/21/2016 0303   MONOABS 1.3 (H) 10/21/2016 0303   EOSABS 0.4 10/21/2016 0303   BASOSABS 0.0 10/21/2016 0303     Assessment/Plan:  1. AKI/CKD stage 3-4- has been oliguric and requiring CVVHD/IHD.  CVVHD initiated 10/15/16 and stopped 10/25/16 and tolerated IHD well on 10/27/16. 1. Will continue with IHD on MWF schedule for now and follow for possible recovery of renal function.   2. CAD s/p CABG x 2 (LIMA, and right SVG) and MAZE procedure 10/09/16.  Doing better and off of pressors 3. Anemia of CKD with ABLA- on ESA and transfuse prn 1. feraheme 10/15/16 and again 10/22/16 4. Vascular access- Appreciate Dr. Luther Parody assistance with placement of a tunneled HD cath as well as AVF/AVG 5. Severe debilitation- continue with PT/OT and will likely require CIR/SNF once stable for discharge  6. DM- per primary 7. Hypokalemia- use 4K bath with HD. 8. Insomnia- will prescribe trazadone qhs and not prn 9. Itching- phos stable, will order SARNA lotion.  Donetta Potts, MD [employer] [phone]

## 2016-10-28 NOTE — H&P (View-Only) (Signed)
[facility] NOTE   MRN : [id number]  Reason for Consult: Tunneled Dialysis catheter Referring Physician: Dr. Arty Baumgartner  History of Present Illness: 83 y/o male with acute on CKD s/p    Procedure:     10/09/2016   Coronary Artery Bypass Grafting x 2  Left Internal Mammary Artery to Distal Left Anterior Descending Coronary Artery Saphenous Vein Graft to Obtuse Marginal Branch of Left Circumflex Coronary Artery Endoscopic Vein Harvest from Right Thigh    Maze Procedure  Complete bilateral atrial lesion set using bipolar radiofrequency and cryothermy ablation Clipping of LA appendage (Atriclip, size 48m)  He currently has a temporary HD catheter 10/15/2016 and we have been asked to provide a tunneled HD catheter, as well as permanent access av fistula verses graft.       Past medical history: hypertension, chronic diastolic congestive heart failure, stage IV chronic kidney disease, hyperlipidemia, recurrent paroxysmal atrial fibrillation on long-term anticoagulation, bronchiectasis with chronic dyspnea on exertion, COPD with remote history of tobacco use, asthma, rheumatoid arthritis, type 2 diabetes mellitus, and anemia.   I have examined the patient, reviewed and agree with above.  ECurt Jews MD 10/27/2016 12:52 PM   Current Facility-Administered Medications  Medication Dose Route Frequency Provider Last Rate Last Dose  . 0.9 %  sodium chloride infusion  250 mL Intravenous Continuous ORexene Alberts MD      . 0.9 %  sodium chloride infusion  250 mL Intravenous PRN ORexene Alberts MD      . amiodarone (PACERONE) tablet 400 mg  400 mg Per Tube Q12H ORexene Alberts MD   400 mg at 10/27/16 0906  . aspirin chewable tablet 81 mg  81 mg Per Tube Daily BEinar Grad RPH   81 mg at 10/27/16 00865 . chlorhexidine gluconate (MEDLINE KIT) (PERIDEX) 0.12 % solution 15 mL  15 mL Mouth Rinse BID ORexene Alberts MD   15 mL at 10/27/16  0907  . Chlorhexidine Gluconate Cloth 2 % PADS 6 each  6 each Topical Q0600 ORexene Alberts MD   6 each at 10/27/16 [phone] . enoxaparin (LOVENOX) injection 30 mg  30 mg Subcutaneous Q48H ORexene Alberts MD   30 mg at 10/26/16 0910  . feeding supplement (PRO-STAT SUGAR FREE 64) liquid 30 mL  30 mL Per Tube TID ORexene Alberts MD   30 mL at 10/27/16 0905  . feeding supplement (VITAL 1.5 CAL) liquid 1,000 mL  1,000 mL Per Tube Continuous ORexene Alberts MD      . fentaNYL (SUBLIMAZE) injection 25 mcg  25 mcg Intravenous Q1H PRN ORexene Alberts MD   25 mcg at 10/26/16 1241  . fentaNYL (SUBLIMAZE) injection 50 mcg  50 mcg Intravenous Q1H PRN ORexene Alberts MD      . folic acid (FOLVITE) tablet 1 mg  1 mg Per Tube Daily BEinar Grad RPH   1 mg at 10/27/16 09629 . Gerhardt's butt cream   Topical BID ORexene Alberts MD      . guaiFENesin (ROBITUSSIN) 100 MG/5ML solution 400 mg  20 mL Oral Q4H PRN ORexene Alberts MD      . heparin bolus via infusion syringe 1,000 Units  1,000 Units CRRT PRN DJamal Maes MD      . heparin injection 1,000-6,000 Units  1,000-6,000 Units CRRT PRN DJamal Maes MD   2,000 Units at 10/23/16 1013  . heparinized saline (2000 units/L)  primer fluid for CRRT   CRRT PRN Jamal Maes, MD 999 mL/hr at 10/21/16 1705    . insulin aspart (novoLOG) injection 0-24 Units  0-24 Units Subcutaneous Q4H Rexene Alberts, MD   2 Units at 10/27/16 0413  . insulin detemir (LEVEMIR) injection 20 Units  20 Units Subcutaneous BID Rexene Alberts, MD   20 Units at 10/27/16 [phone]  . levothyroxine (SYNTHROID, LEVOTHROID) tablet 25 mcg  25 mcg Per Tube QAC breakfast Einar Grad, RPH   25 mcg at 10/27/16 0350  . MEDLINE mouth rinse  15 mL Mouth Rinse q12n4p Rexene Alberts, MD   15 mL at 10/24/16 1727  . midazolam (VERSED) injection 1-2 mg  1-2 mg Intravenous Q2H PRN Rush Farmer, MD   2 mg at 10/20/16 0045  . moving right along book   Does not apply Once Rexene Alberts, MD      . multivitamin liquid 15 mL  15 mL Per Tube Daily Rexene Alberts, MD   15 mL at 10/27/16 0905  . ondansetron (ZOFRAN) injection 4 mg  4 mg Intravenous Q6H PRN Rexene Alberts, MD   4 mg at 10/26/16 0152  . pantoprazole sodium (PROTONIX) 40 mg/20 mL oral suspension 40 mg  40 mg Per Tube Daily Minor, Grace Bushy, NP   40 mg at 10/27/16 0905  . patient's guide to using coumadin book   Does not apply Once Lyndee Leo, [facility]      . RESOURCE THICKENUP CLEAR   Oral PRN Rexene Alberts, MD      . sodium chloride flush (NS) 0.9 % injection 10-40 mL  10-40 mL Intracatheter Q12H Rexene Alberts, MD   30 mL at 10/27/16 0830  . sodium chloride flush (NS) 0.9 % injection 10-40 mL  10-40 mL Intracatheter PRN Rexene Alberts, MD   20 mL at 10/22/16 0918  . sodium chloride flush (NS) 0.9 % injection 3 mL  3 mL Intravenous Q12H Rexene Alberts, MD   3 mL at 10/27/16 0908  . sodium chloride flush (NS) 0.9 % injection 3 mL  3 mL Intravenous PRN Rexene Alberts, MD   3 mL at 10/13/16 1100  . sodium chloride flush (NS) 0.9 % injection 3 mL  3 mL Intravenous Q12H Rexene Alberts, MD   3 mL at 10/26/16 0911  . sodium chloride flush (NS) 0.9 % injection 3 mL  3 mL Intravenous PRN Rexene Alberts, MD   3 mL at 10/13/16 1100  . traZODone (DESYREL) tablet 25 mg  25 mg Per Tube QHS PRN Melrose Nakayama, MD   25 mg at 10/26/16 2249  . warfarin (COUMADIN) tablet 5 mg  5 mg Per Tube [id number] Rexene Alberts, MD      . Warfarin - Physician Dosing Inpatient   Does not apply q1800 Rexene Alberts, MD        Pt meds include: Statin :Yes Betablocker: No ASA: Yes Other anticoagulants/antiplatelets: Coumadin   Past Medical History:  Diagnosis Date  . Anemia   . Asthma   . Atrial fibrillation ([place]) 12/08/2013  . CHF (congestive heart failure) ([place])   . Chronic joint pain    "from RA" (10/01/2016)  . CKD (chronic kidney disease), stage III   . Constipation    takes stool softener daily  .  COPD (chronic obstructive pulmonary disease) ([place])   . Coronary artery disease   . Coronary artery disease involving  native coronary artery of native heart with angina pectoris ([place]) 09/30/2016  . Dysrhythmia    a-fib  . GERD (gastroesophageal reflux disease)    takes Zantac daily   . History of blood transfusion    "related to one of my ORs"  . Hyperlipidemia    takes Pravastatin daily  . Hypertension    takes Cardizem,Proscar,and Lisinopril daily  . Hypothyroidism   . Impaired hearing    wears hearing aids  . Joint swelling    "from RA" (10/01/2016)  . Peripheral neuropathy   . Pneumonia 2017  . Rheumatoid arthritis ([place])    "all over; take orencia  q 4 wks" (10/01/2016)  . S/P CABG x 2 10/09/2016   LIMA to LAD, SVG to OM, EVH via right thigh  . S/P Maze operation for atrial fibrillation 10/09/2016   Complete bilateral atrial lesion set using bipolar radiofrequency and cryothermy ablation with clipping of LA appendage  . Type II diabetes mellitus ([place])     Past Surgical History:  Procedure Laterality Date  . AMPUTATION TOE Left 11/08/2015   Procedure: LEFT HALLUX AMPUTATION AT THE METATARSOPHALANGEAL JOINT;  Surgeon: Wylene Simmer, MD;  Location: [place];  Service: Orthopedics;  Laterality: Left;  . ANKLE FUSION Left 2014  . BIOPSY THYROID  2014  . CARDIAC CATHETERIZATION  09/30/2016  . CARPAL TUNNEL RELEASE Bilateral 1990's  . CATARACT EXTRACTION W/ INTRAOCULAR LENS  IMPLANT, BILATERAL Bilateral 1990's  . CLIPPING OF ATRIAL APPENDAGE  10/09/2016   Procedure: CLIPPING OF ATRIAL APPENDAGE;  Surgeon: Rexene Alberts, MD;  Location: [place];  Service: Open Heart Surgery;;  . COLONOSCOPY    . CORONARY ARTERY BYPASS GRAFT N/A 10/09/2016   Procedure: CORONARY ARTERY BYPASS GRAFTING (CABG) x two, using left internal mammary artery and right leg greater saphenous vein harvested endoscopically;  Surgeon: Rexene Alberts, MD;  Location: [place];  Service: Open Heart Surgery;   Laterality: N/A;  . FOREIGN BODY REMOVAL Right 09/06/2013   Procedure: REMOVAL FOREIGN BODY RIGHT INDEX FINGER;  Surgeon: Daryll Brod, MD;  Location: [place];  Service: Orthopedics;  Laterality: Right;  ANESTHESIA: IV REGIONAL FAB  . JOINT REPLACEMENT    . MAZE N/A 10/09/2016   Procedure: MAZE;  Surgeon: Rexene Alberts, MD;  Location: [place];  Service: Open Heart Surgery;  Laterality: N/A;  . REPAIR TENDONS FOOT Left 1946; 1984   "farming accident; repaired tendons both times"  . RIGHT/LEFT HEART CATH AND CORONARY ANGIOGRAPHY N/A 09/30/2016   Procedure: RIGHT/LEFT HEART CATH AND CORONARY ANGIOGRAPHY;  Surgeon: Nigel Mormon, MD;  Location: [place] [facility];  Service: Cardiovascular;  Laterality: N/A;  . SHOULDER ARTHROSCOPY W/ ROTATOR CUFF REPAIR Right 1990's  . TEE WITHOUT CARDIOVERSION N/A 10/09/2016   Procedure: TRANSESOPHAGEAL ECHOCARDIOGRAM (TEE);  Surgeon: Rexene Alberts, MD;  Location: [place];  Service: Open Heart Surgery;  Laterality: N/A;  . THYROIDECTOMY Right 02/01/2013   Procedure: RIGHT THYROIDECTOMY LOBECTOMY;  Surgeon: Earnstine Regal, MD;  Location: [place];  Service: General;  Laterality: Right;  . TONSILLECTOMY  1930's  . TOTAL KNEE ARTHROPLASTY Right 12/16/2010   Procedure: TOTAL KNEE ARTHROPLASTY; rt Surgeon: Rudean Haskell, MD;  Location: [place];  Service: Orthopedics;  Laterality: Right;  . TOTAL SHOULDER REPLACEMENT Left 03/2010    Social History Social History  Substance Use Topics  . Smoking status: Former Smoker    Packs/day: 1.00    Years: 15.00    Types: Cigarettes  Quit date: 09/01/1965  . Smokeless tobacco: Never Used     Comment: quit smoking in 1967  . 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 Cancer Mother        breast  . Aneurysm Father     No Known Allergies   REVIEW OF SYSTEMS  General: _0   Weight loss, _1  Fever, _2  chills Neurologic: _3  Dizziness, _4  Blackouts, _5  Seizure _6  Stroke, _7  "Mini stroke", _8  Slurred speech, _9  Temporary blindness; _10  weakness in arms or legs, _11  Hoarseness _12  Dysphagia Cardiac: _13  Chest pain/pressure, _14  Shortness of breath at rest _15  Shortness of breath with exertion, _16  Atrial fibrillation or irregular heartbeat  Vascular: _17  Pain in legs with walking, _18  Pain in legs at rest, _19  Pain in legs at night,  _20  Non-healing ulcer, _21  Blood clot in vein/DVT,   Pulmonary: _22  Home oxygen, _23  Productive cough, _24  Coughing up blood, _25  Asthma,  _26  Wheezing _27  COPD Musculoskeletal:  _28  Arthritis, _29  Low back pain, _30  Joint pain Hematologic: _31  Easy Bruising, _32  Anemia; _33  Hepatitis Gastrointestinal: _34  Blood in stool, _35  Gastroesophageal Reflux/heartburn, Urinary: [x ] chronic Kidney disease, [x ] on HD - [x ] MWF or _36  TTHS, _37  Burning with urination, _38  Difficulty urinating Skin: _39  Rashes, _40  Wounds Psychological: _41  Anxiety, _42  Depression  Physical Examination Vitals:   10/27/16 1145 10/27/16 1156 10/27/16 1200 10/27/16 1215  BP: (!) 95/52 (!) 103/56 (!) 98/55 (!) 90/52  Pulse: 88 89 89 88  Resp: 19 (!) _43 Temp:  97.6 F (36.4 C)  97.6 F (36.4 C)  TempSrc:  Oral  Oral  SpO2: 97% 98% 99% 99%  Weight:      Height:       Body mass index is 31.78 kg/m.  General:  WDWN in NAD HENT: WNL, NG tube in place Eyes: Pupils equal Pulmonary: normal non-labored breathing , without Rales, rhonchi,  wheezing Cardiac: A fib, without  Murmurs, rubs or gallops; No carotid bruits Abdomen: soft, NT, no masses Skin: no rashes, ulcers noted;  no Gangrene , no cellulitis; no open wounds;   Vascular Exam/Pulses:UE warm, motor intact, sensation intact, non phallales radial brachial B.  Right UE O2 sensor in place and working well.   Musculoskeletal: no muscle wasting or atrophy;  Neurologic: A&O X 3; Appropriate  Affect ;  SENSATION: normal; MOTOR FUNCTION: grossly intact all 4 ext. Speech is fluent/normal   Significant Diagnostic Studies: CBC Lab Results  Component Value Date   WBC 10.5 10/27/2016   HGB 7.3 (L) 10/27/2016   HCT 22.5 (L) 10/27/2016   MCV 93.4 10/27/2016   PLT 193 10/27/2016    BMET    Component Value Date/Time   NA 135 10/27/2016 0400   K 3.2 (L) 10/27/2016 0400   CL 100 (L) 10/27/2016 0400   CO2 25 10/27/2016 0400   GLUCOSE 131 (H) 10/27/2016 0400   BUN 72 (H) 10/27/2016 0400   CREATININE 3.87 (H) 10/27/2016 0400   CALCIUM 7.8 (L) 10/27/2016 0400   GFRNONAA 13 (L) 10/27/2016 0400   GFRAA 15 (L)  10/27/2016 0400   Estimated Creatinine Clearance: 15.9 mL/min (A) (by C-G formula based on SCr of 3.87 mg/dL (H)).  COAG Lab Results  Component Value Date   INR 1.22 10/27/2016   INR 1.27 10/26/2016   INR 1.40 10/25/2016     Non-Invasive Vascular Imaging: pending vein mapping and arterial duplexes B UE  ASSESSMENT/PLAN:  Acute on CKD Plan remove temp right HD catheter and place tunneled catheter.  He is left hand dominant so we will plan right UE AV fistula verses AV graft pending vein mapping.   Laurence Slate [facility] 10/27/2016 12:28 PM   I have examined the patient, reviewed and agree with above.Palp ulnar pulse bilat. No radial pulse.. 2+ brachial bilat.  Small surface veins by PE.  Left handed. Being started on Coumadin so would like to get [facility] and arm access. Discussed at length with patient.  Will plan OR tomorrow  Curt Jews, MD 10/27/2016 12:53 PM

## 2016-10-28 NOTE — Progress Notes (Signed)
Inpatient Diabetes Program Recommendations  AACE/ADA: New Consensus Statement on Inpatient Glycemic Control (2015)  Target Ranges:  Prepandial:   less than 140 mg/dL      Peak postprandial:   less than 180 mg/dL (1-2 hours)      Critically ill patients:  140 - 180 mg/dL   Lab Results  Component Value Date   GLUCAP 71 10/28/2016   HGBA1C 7.2 (H) 10/06/2016    Review of Glycemic Control Results for [name], [name] (MRN [id number]) as of 10/28/2016 10:43  Ref. Range 10/27/2016 20:11 10/27/2016 23:46 10/28/2016 05:40 10/28/2016 07:57 10/28/2016 10:09  Glucose-Capillary Latest Ref Range: 65 - 99 mg/dL 145 (H) 187 (H) 71 69 71   Diabetes history: DM2 Outpatient Diabetes medications: Toujeo 14 units daily Current orders for Inpatient glycemic control: Levemir 20 units tid + Novolog 0-24 units correction q 4 hrs.  Inpatient Diabetes Program Recommendations:    Noted fasting CBG 71. May consider decrease in Levemir to 15 units bid.  Thank you, [name]. William Jonsson, RN, MSN, CDE  Diabetes Coordinator Inpatient Glycemic Control Team Team Pager [phone] (8am-5pm) 10/28/2016 10:46 AM

## 2016-10-28 NOTE — Progress Notes (Signed)
      [address]       [place] [address]             [phone]        CARDIOTHORACIC SURGERY PROGRESS NOTE   R19 Days Post-Op Procedure(s) (LRB): CORONARY ARTERY BYPASS GRAFTING (CABG) x two, using left internal mammary artery and right leg greater saphenous vein harvested endoscopically (N/A) MAZE (N/A) TRANSESOPHAGEAL ECHOCARDIOGRAM (TEE) (N/A) CLIPPING OF ATRIAL APPENDAGE  Subjective: No complaints.  Feels weak.  Denies SOB or pain.  Objective: Vital signs: BP Readings from Last 1 Encounters:  10/28/16 (!) 96/58   Pulse Readings from Last 1 Encounters:  10/28/16 88   Resp Readings from Last 1 Encounters:  10/28/16 20   Temp Readings from Last 1 Encounters:  10/28/16 (!) 97.5 F (36.4 C) (Oral)    Hemodynamics:    Physical Exam:  Rhythm:   sinus  Breath sounds: clear  Heart sounds:  RRR  Incisions:  Clean and dry  Abdomen:  Soft, non-distended, non-tender  Extremities:  Warm, well-perfused    Intake/Output from previous day: 10/15 0701 - 10/16 0700 In: 1834.2 [P.O.:270; I.V.:100; Blood:1149.5; NG/GT:314.7] Out: 2201 [Stool:451] Intake/Output this shift: No intake/output data recorded.  Lab Results:  CBC: Recent Labs  10/27/16 0400 10/27/16 1632 10/28/16 0526  WBC 10.5  --  12.5*  HGB 7.3* 11.2* 10.3*  HCT 22.5* 33.0* 31.6*  PLT 193  --  215    BMET:  Recent Labs  10/27/16 1624 10/27/16 1632 10/28/16 0526  NA 136 137 137  K 3.4* 3.5 3.8  CL 99* 96* 100*  CO2 29  --  27  GLUCOSE 70 66 78  BUN 20 20 37*  CREATININE 1.81* 1.60* 2.96*  CALCIUM 7.6*  --  8.1*     PT/INR:   Recent Labs  10/28/16 0526  LABPROT 15.2  INR 1.21    CBG (last 3)   Recent Labs  10/27/16 2346 10/28/16 0540 10/28/16 0757  GLUCAP 187* 71 69    ABG    Component Value Date/Time   PHART 7.417 10/20/2016 0337   PCO2ART 42.7 10/20/2016 0337   PO2ART 76.0 (L) 10/20/2016 0337   HCO3 27.5 10/20/2016 0337   TCO2 26 10/27/2016  1632   ACIDBASEDEF 1.0 10/17/2016 0007   O2SAT 71.5 10/20/2016 0345    CXR: n/a  Assessment/Plan: S/P Procedure(s) (LRB): CORONARY ARTERY BYPASS GRAFTING (CABG) x two, using left internal mammary artery and right leg greater saphenous vein harvested endoscopically (N/A) MAZE (N/A) TRANSESOPHAGEAL ECHOCARDIOGRAM (TEE) (N/A) CLIPPING OF ATRIAL APPENDAGE  Clinically stable For placement of tunneled HD catheter today Marginal oral intake - will plan to continue tube feeds once he returns from OR Continue PT/OT Hold levemir insulin while tube feeds on hold for OR Continue amiodarone, Coumadin  Rexene Alberts, MD 10/28/2016 8:47 AM

## 2016-10-28 NOTE — Anesthesia Procedure Notes (Signed)
Procedure Name: MAC Date/Time: 10/28/2016 1:33 PM Performed by: Neldon Newport Pre-anesthesia Checklist: Timeout performed, Patient being monitored, Suction available, Emergency Drugs available and Patient identified Patient Re-evaluated:Patient Re-evaluated prior to induction Oxygen Delivery Method: Simple face mask

## 2016-10-28 NOTE — Anesthesia Preprocedure Evaluation (Addendum)
Anesthesia Evaluation  Patient identified by MRN, date of birth, ID band Patient awake    Reviewed: Allergy & Precautions, NPO status , Patient's Chart, lab work & pertinent test results  History of Anesthesia Complications Negative for: history of anesthetic complications  Airway Mallampati: II  TM Distance: <3 FB Neck ROM: Full    Dental no notable dental hx. (+) Dental Advidsory Given, Edentulous Upper, Edentulous Lower   Pulmonary asthma , COPD, former smoker,     + decreased breath sounds      Cardiovascular hypertension, + angina + CAD, + CABG and +CHF  + dysrhythmias  Rhythm:Regular Rate:Normal     Neuro/Psych    GI/Hepatic GERD  ,  Endo/Other  diabetesHypothyroidism   Renal/GU CRFRenal disease     Musculoskeletal   Abdominal   Peds  Hematology   Anesthesia Other Findings   Reproductive/Obstetrics                            Anesthesia Physical Anesthesia Plan  ASA: IV  Anesthesia Plan:    Post-op Pain Management:    Induction: Intravenous  PONV Risk Score and Plan: 2 and Ondansetron and Dexamethasone  Airway Management Planned: Natural Airway and Simple Face Mask  Additional Equipment:   Intra-op Plan:   Post-operative Plan:   Informed Consent: I have reviewed the patients History and Physical, chart, labs and discussed the procedure including the risks, benefits and alternatives for the proposed anesthesia with the patient or authorized representative who has indicated his/her understanding and acceptance.   Dental advisory given and Dental Advisory Given  Plan Discussed with: CRNA, Anesthesiologist and Surgeon  Anesthesia Plan Comments:        Anesthesia Quick Evaluation

## 2016-10-28 NOTE — Interval H&P Note (Signed)
History and Physical Interval Note:  10/28/2016 1:06 PM  [name]  has presented today for surgery, with the diagnosis of END STAGE RENAL DISEASE   The various methods of treatment have been discussed with the patient and family. After consideration of risks, benefits and other options for treatment, the patient has consented to  Procedure(s): ARTERIOVENOUS (AV) FISTULA CREATION vs GRAFT RIGHT ARM (Right) INSERTION OF DIALYSIS CATHETER (N/A) as a surgical intervention .  The patient's history has been reviewed, patient examined, no change in status, stable for surgery.  I have reviewed the patient's chart and labs.  Questions were answered to the patient's satisfaction.     [name]

## 2016-10-29 ENCOUNTER — Telehealth: Payer: Self-pay | Admitting: Vascular Surgery

## 2016-10-29 ENCOUNTER — Inpatient Hospital Stay (HOSPITAL_COMMUNITY): Payer: Medicare Other

## 2016-10-29 ENCOUNTER — Encounter (HOSPITAL_COMMUNITY): Payer: Self-pay | Admitting: Vascular Surgery

## 2016-10-29 LAB — CBC
HEMATOCRIT: 33.7 % — AB (ref 39.0–52.0)
HEMOGLOBIN: 10.8 g/dL — AB (ref 13.0–17.0)
MCH: 29.8 pg (ref 26.0–34.0)
MCHC: 32 g/dL (ref 30.0–36.0)
MCV: 92.8 fL (ref 78.0–100.0)
Platelets: 230 10*3/uL (ref 150–400)
RBC: 3.63 MIL/uL — ABNORMAL LOW (ref 4.22–5.81)
RDW: 17.8 % — AB (ref 11.5–15.5)
WBC: 12.9 10*3/uL — AB (ref 4.0–10.5)

## 2016-10-29 LAB — GLUCOSE, CAPILLARY
GLUCOSE-CAPILLARY: 184 mg/dL — AB (ref 65–99)
GLUCOSE-CAPILLARY: 394 mg/dL — AB (ref 65–99)
GLUCOSE-CAPILLARY: 107 mg/dL — AB (ref 65–99)
Glucose-Capillary: 168 mg/dL — ABNORMAL HIGH (ref 65–99)
Glucose-Capillary: 202 mg/dL — ABNORMAL HIGH (ref 65–99)
Glucose-Capillary: 261 mg/dL — ABNORMAL HIGH (ref 65–99)

## 2016-10-29 LAB — BASIC METABOLIC PANEL
ANION GAP: 11 (ref 5–15)
BUN: 59 mg/dL — ABNORMAL HIGH (ref 6–20)
CHLORIDE: 100 mmol/L — AB (ref 101–111)
CO2: 25 mmol/L (ref 22–32)
Calcium: 8 mg/dL — ABNORMAL LOW (ref 8.9–10.3)
Creatinine, Ser: 4.16 mg/dL — ABNORMAL HIGH (ref 0.61–1.24)
GFR calc non Af Amer: 12 mL/min — ABNORMAL LOW (ref >60)
GFR, EST AFRICAN AMERICAN: 14 mL/min — AB (ref >60)
GLUCOSE: 203 mg/dL — AB (ref 65–99)
Potassium: 4 mmol/L (ref 3.5–5.1)
Sodium: 136 mmol/L (ref 135–145)

## 2016-10-29 LAB — PROTIME-INR
INR: 1.18
Prothrombin Time: 14.9 seconds (ref 11.4–15.2)

## 2016-10-29 LAB — HEPATITIS B SURFACE ANTIGEN: Hepatitis B Surface Ag: POSITIVE — AB

## 2016-10-29 LAB — PHOSPHORUS: PHOSPHORUS: 4.8 mg/dL — AB (ref 2.5–4.6)

## 2016-10-29 MED ORDER — CHLORHEXIDINE GLUCONATE 0.12 % MT SOLN
OROMUCOSAL | Status: AC
  Administered 2016-10-29: 15 mL via OROMUCOSAL
  Filled 2016-10-29: qty 15

## 2016-10-29 MED ORDER — TRAZODONE HCL 50 MG PO TABS
50.0000 mg | ORAL_TABLET | Freq: Every evening | ORAL | Status: DC | PRN
  Administered 2016-10-29 – 2016-10-30 (×2): 50 mg via ORAL
  Filled 2016-10-29 (×2): qty 1

## 2016-10-29 MED ORDER — INSULIN DETEMIR 100 UNIT/ML ~~LOC~~ SOLN
20.0000 [IU] | Freq: Two times a day (BID) | SUBCUTANEOUS | Status: DC
  Administered 2016-10-29 – 2016-10-30 (×3): 20 [IU] via SUBCUTANEOUS
  Filled 2016-10-29 (×3): qty 0.2

## 2016-10-29 MED ORDER — FERROUS SULFATE 325 (65 FE) MG PO TABS
325.0000 mg | ORAL_TABLET | Freq: Every day | ORAL | Status: DC
  Administered 2016-10-30 – 2016-11-03 (×5): 325 mg via ORAL
  Filled 2016-10-29 (×5): qty 1

## 2016-10-29 MED ORDER — FINASTERIDE 5 MG PO TABS
5.0000 mg | ORAL_TABLET | Freq: Every day | ORAL | Status: DC
  Administered 2016-10-29 – 2016-11-02 (×5): 5 mg via ORAL
  Filled 2016-10-29 (×5): qty 1

## 2016-10-29 MED ORDER — TAMSULOSIN HCL 0.4 MG PO CAPS
0.4000 mg | ORAL_CAPSULE | Freq: Every day | ORAL | Status: DC
  Administered 2016-10-29 – 2016-11-02 (×5): 0.4 mg via ORAL
  Filled 2016-10-29 (×5): qty 1

## 2016-10-29 MED ORDER — METOPROLOL TARTRATE 12.5 MG HALF TABLET
12.5000 mg | ORAL_TABLET | Freq: Two times a day (BID) | ORAL | Status: DC
  Administered 2016-10-29 – 2016-11-03 (×8): 12.5 mg via ORAL
  Filled 2016-10-29 (×9): qty 1

## 2016-10-29 MED ORDER — FOLIC ACID 1 MG PO TABS
1.0000 mg | ORAL_TABLET | Freq: Every day | ORAL | Status: DC
  Administered 2016-10-29 – 2016-11-02 (×5): 1 mg via ORAL
  Filled 2016-10-29 (×5): qty 1

## 2016-10-29 MED ORDER — ATORVASTATIN CALCIUM 40 MG PO TABS
40.0000 mg | ORAL_TABLET | Freq: Every day | ORAL | Status: DC
  Administered 2016-10-29 – 2016-11-02 (×5): 40 mg via ORAL
  Filled 2016-10-29 (×5): qty 1

## 2016-10-29 MED ORDER — GABAPENTIN 300 MG PO CAPS
300.0000 mg | ORAL_CAPSULE | Freq: Two times a day (BID) | ORAL | Status: DC
  Administered 2016-10-29 – 2016-11-03 (×11): 300 mg via ORAL
  Filled 2016-10-29 (×2): qty 3
  Filled 2016-10-29 (×6): qty 1
  Filled 2016-10-29: qty 3
  Filled 2016-10-29 (×2): qty 1

## 2016-10-29 MED ORDER — ONDANSETRON HCL 4 MG/2ML IJ SOLN
INTRAMUSCULAR | Status: AC
  Filled 2016-10-29: qty 2

## 2016-10-29 MED ORDER — AMIODARONE HCL 200 MG PO TABS
200.0000 mg | ORAL_TABLET | Freq: Two times a day (BID) | ORAL | Status: DC
  Administered 2016-10-29 (×2): 200 mg
  Filled 2016-10-29 (×2): qty 1

## 2016-10-29 NOTE — Progress Notes (Signed)
Modified Barium Swallow Progress Note  Patient Details  Name: [name] MRN: [id number] Date of Birth: [birth year]  Today's Date: 10/29/2016  Modified Barium Swallow completed.  Full report located under Chart Review in the Imaging Section.  Brief recommendations include the following:  Clinical Impression  Swallow function has mild-moderately improved from FEES 10/11. Incomplete inversion of epiglottis and laryngeal closure/protection resulted in silent penetration with thin and to vocal cords with nectar. Compensatory strategies for chin tuck and supraglottic breath hold were not successful for increased laryngeal protection. He required cueing for smaller sips. Mild vallecular residue given reduced epiglottic inversion and min pyriform sinus inconsistently. Presence of Cortrack feeding tube may have contributed and prevented full movement of epiglottis. Oral impairments included lingual residue post swallow and inconsistent premature spill. SLP questions premorbid dysphagia with pt compensating as SLP had predicted more improvements. Family denied outward signs of difficulty at home  Esophageal scan did not reveal overt abnormalities however MBS does not diagnose below the level of the UES. Recommend texture upgrade to Dys 3 (mechanical soft) and initiation of liquids thickened to honey consistency, volitional cough frequently, two swallows, no straws and pills whole in puree.    Swallow Evaluation Recommendations       SLP Diet Recommendations: Dysphagia 3 (Mech soft) solids;Honey thick liquids   Liquid Administration via: Cup;No straw   Medication Administration: Whole meds with puree   Supervision: Patient able to self feed;Full supervision/cueing for compensatory strategies   Compensations: Slow rate;Small sips/bites;Multiple dry swallows after each bite/sip;Hard cough after swallow   Postural Changes: Remain semi-upright after after feeds/meals (Comment);Seated upright at 90  degrees   Oral Care Recommendations: Oral care BID        [name] 10/29/2016,11:18 AM   [name] Colvin Caroli.Ed [employer] [phone]

## 2016-10-29 NOTE — Telephone Encounter (Signed)
Sched appt 12/17/16; lab at 2:00 and MD at 3:00. Vm full, mailed appt letter.

## 2016-10-29 NOTE — Evaluation (Signed)
Occupational Therapy Evaluation Patient Details Name: [name] MRN: [id number] DOB: [birth year] Today's Date: 10/29/2016    History of Present Illness Pt admit for CABGx2 and MAZE procedure on 9/27.  He developed worsening renal failure post operatively requiring CRRT, Intubation 9/27-10/8.   On 10/3 he developed shock in the setting of a spontaneous R sided pneumothorax.  PMHx: hypertension, chronic diastolic congestive heart failure, stage IV chronic kidney disease, hyperlipidemia, recurrent PAF on long-term anticoagulation, bronchiectasis with chronic dyspnea on exertion, COPD, asthma, RA, DM, and anemia    Clinical Impression   PTA, pt was independent with rollator or cane for ADL and functional mobility. He currently requires mod assist +2 (+3 for chair follow) for ambulating toilet transfers with EVA walker, max assist for LB ADL, and total assist for toileting hygiene. Pt benefited from encouragement to participate and demonstrating improved motivation after he was able to ambulate in hallway with assistance. Pt would benefit from CIR level therapies post-acute D/C as he is motivated to return to [facility] and has excellent family support. Will continue to follow while admitted.    Follow Up Recommendations  CIR;Supervision/Assistance - 24 hour    Equipment Recommendations  Other (comment) (TBD at nexy venue of care)    Recommendations for Other Services Rehab consult     Precautions / Restrictions Precautions Precautions: Fall;Sternal Restrictions Weight Bearing Restrictions: Yes RUE Weight Bearing: Weight bearing as tolerated      Mobility Bed Mobility Overal bed mobility: Needs Assistance Bed Mobility: Sit to Supine       Sit to supine: Mod assist   General bed mobility comments: OOB in chair on arrival.  Mod assist to lie down as pt needed assist for trunk and LEs  Transfers Overall transfer level: Needs assistance Equipment used:  (EVA walker) Transfers: Sit  to/from Stand Sit to Stand: Mod assist;+2 physical assistance (3rd person for chair follow)         General transfer comment: ModAx2 for power up varying but even with min assist for power up mod cues needed for safety and used pad each time to assist with lift.  Assist and cues for steadying and reaching to EVA walker.  Nursing did clean pts bottom last stand and pivot to chair as he had some stool leakage from flexi seal.     Balance Overall balance assessment: Needs assistance Sitting-balance support: Feet supported;No upper extremity supported Sitting balance-Leahy Scale: Fair     Standing balance support: Bilateral upper extremity supported;During functional activity Standing balance-Leahy Scale: Poor Standing balance comment: relies on UE support.                            ADL either performed or assessed with clinical judgement   ADL Overall ADL's : Needs assistance/impaired Eating/Feeding: Sitting;Minimal assistance   Grooming: Minimal assistance;Sitting   Upper Body Bathing: Sitting;Moderate assistance   Lower Body Bathing: Maximal assistance;Sit to/from stand;+2 for safety/equipment   Upper Body Dressing : Sitting;Moderate assistance   Lower Body Dressing: Maximal assistance;Sit to/from stand;+2 for safety/equipment   Toilet Transfer: Moderate assistance;+2 for physical assistance;+2 for safety/equipment;Ambulation;BSC (+3 for chair follow; with [place] walker; cues for safety)   Toileting- Clothing Manipulation and Hygiene: Total assistance;Sit to/from stand       Functional mobility during ADLs: Moderate assistance (Eva walker) General ADL Comments: Cueing throughout for safety and encouragement. Pt much more motivated after he saw what he could accomplish.  Vision Patient Visual Report: No change from baseline Vision Assessment?: No apparent visual deficits     Perception     Praxis      Pertinent Vitals/Pain Pain Assessment: Faces Faces  Pain Scale: Hurts a little bit Pain Location: chest incision Pain Descriptors / Indicators: Discomfort Pain Intervention(s): Limited activity within patient's tolerance;Monitored during session;Repositioned     Hand Dominance Left   Extremity/Trunk Assessment Upper Extremity Assessment Upper Extremity Assessment: Generalized weakness   Lower Extremity Assessment Lower Extremity Assessment: Generalized weakness   Cervical / Trunk Assessment Cervical / Trunk Assessment: Kyphotic   Communication Communication Communication: HOH   Cognition Arousal/Alertness: Awake/alert Behavior During Therapy: Flat affect Overall Cognitive Status: Impaired/Different from baseline Area of Impairment: Orientation;Attention;Memory;Following commands                 Orientation Level: Disoriented to;Time Current Attention Level: Selective Memory: Decreased recall of precautions;Decreased short-term memory Following Commands: Follows multi-step commands consistently Safety/Judgement: Decreased awareness of safety;Decreased awareness of deficits   Problem Solving: Slow processing;Requires verbal cues;Requires tactile cues;Decreased initiation;Difficulty sequencing General Comments: demonstrating adherence to sternal precautions   General Comments  VSS throughout    Exercises Exercises: General Lower Extremity;General Upper Extremity General Exercises - Lower Extremity Long Arc Quad: AAROM;Both;Seated;5 reps   Shoulder Instructions      Home Living Family/patient expects to be discharged to:: Private residence Living Arrangements: Spouse/significant other Available Help at Discharge: Family Type of Home: House Home Access: [place]: One level     Bathroom Shower/Tub: Occupational psychologist: Standard Bathroom Accessibility: Yes   Home Equipment: Toilet riser;Grab bars - tub/shower;Shower seat - built in;Grab bars - toilet;Cane - single  point;Walker - 4 wheels          Prior Functioning/Environment Level of Independence: Independent with assistive device(s)        Comments: Pt used rollator or cane PTA.  Has bad ankles per family        OT Problem List: Decreased strength;Decreased activity tolerance;Impaired balance (sitting and/or standing);Decreased safety awareness;Decreased knowledge of use of DME or AE;Decreased knowledge of precautions;Pain;Cardiopulmonary status limiting activity      OT Treatment/Interventions: Self-care/ADL training;Therapeutic exercise;Energy conservation;DME and/or AE instruction;Therapeutic activities;Patient/family education;Balance training    OT Goals(Current goals can be found in the care plan section) Acute Rehab OT Goals Patient Stated Goal: to get better OT Goal Formulation: With patient Time For Goal Achievement: 11/12/16 Potential to Achieve Goals: Good ADL Goals Pt Will Perform Grooming: sitting;with supervision;with set-up Pt Will Perform Lower Body Dressing: with min assist;sit to/from stand Pt Will Transfer to Toilet: ambulating;bedside commode;with min guard assist Pt Will Perform Toileting - Clothing Manipulation and hygiene: with min guard assist;sit to/from stand Additional ADL Goal #1: Pt will verbalize sternal precautions independently.   OT Frequency: Min 3X/week   Barriers to D/C:            Co-evaluation              AM-PAC PT "6 Clicks" Daily Activity     Outcome Measure Help from another person eating meals?: A Little Help from another person taking care of personal grooming?: A Little Help from another person toileting, which includes using toliet, bedpan, or urinal?: A Lot Help from another person bathing (including washing, rinsing, drying)?: A Lot Help from another person to put on and taking off regular upper body clothing?: A Lot Help from another person to put on and taking  off regular lower body clothing?: A Lot 6 Click Score: 14    End of Session Equipment Utilized During Treatment: Gait belt (EVA walker) Nurse Communication: Mobility status  Activity Tolerance: Patient tolerated treatment well Patient left: in bed;with call bell/phone within reach;with nursing/sitter in room  OT Visit Diagnosis: Other abnormalities of gait and mobility (R26.89);Muscle weakness (generalized) (M62.81);Pain Pain - Right/Left:  (incision) Pain - part of body:  (incision)                Time: 0900-0930 OT Time Calculation (min): 30 min Charges:  OT General Charges $OT Visit: 1 Visit OT Evaluation $OT Eval Moderate Complexity: 1 Mod OT Treatments $Self Care/Home Management : 8-22 mins G-Codes:     Norman Herrlich, MS OTR/L  Pager: [place] A Raylee Strehl 10/29/2016, 11:34 AM

## 2016-10-29 NOTE — Progress Notes (Signed)
VASCULAR SURGERY ASSESSMENT & PLAN:   1 Day Post-Op s/p: Placement of tunneled dialysis catheter and right brachiocephalic AV fistula  Excellent thrill in fistula.  I have arranged a follow up visit in 6 weeks with a duplex at that time to check on the maturation of the fistula.  Vascular surgery will be available as needed.  SUBJECTIVE:   He is singing this morning in bed and entertaining the nurses.  PHYSICAL EXAM:   Vitals:   10/29/16 0200 10/29/16 0300 10/29/16 0400 10/29/16 0500  BP: (!) 107/51 (!) 111/56 (!) 114/54 (!) 116/55  Pulse: 90 89 96 92  Resp: (!) 21 (!) 21 (!) 25 (!) 23  Temp:      TempSrc:      SpO2: 97% 96% 98% 95%  Weight:    203 lb (92.1 kg)  Height:       Excellent thrill in right brachiocephalic AV fistula. Palpable right radial pulse.  LABS:   Lab Results  Component Value Date   WBC 12.9 (H) 10/29/2016   HGB 10.8 (L) 10/29/2016   HCT 33.7 (L) 10/29/2016   MCV 92.8 10/29/2016   PLT 230 10/29/2016   Lab Results  Component Value Date   CREATININE 2.96 (H) 10/28/2016   Lab Results  Component Value Date   INR 1.21 10/28/2016   CBG (last 3)   Recent Labs  10/28/16 1954 10/28/16 2353 10/29/16 0345  GLUCAP 186* 202* 184*    PROBLEM LIST:    Principal Problem:   S/P CABG x 2 + maze procedure Active Problems:   Diabetes ([place])   HLD (hyperlipidemia)   Rheumatoid arthritis ([place])   Essential hypertension   GERD (gastroesophageal reflux disease)   Anemia   CHF (congestive heart failure) (HCC)   Chronic obstructive airway disease with asthma (HCC)   Atrial fibrillation (HCC)   LVH (left ventricular hypertrophy) due to hypertensive disease   CKD (chronic kidney disease) stage 4, GFR 15-29 ml/min (HCC)   Acute on chronic systolic heart failure (HCC)   Chronic systolic CHF (congestive heart failure) (HCC)   Chronic kidney disease (CKD), stage IV (severe) (HCC)   Pulmonary hypertension (HCC)   Paroxysmal atrial fibrillation (HCC)   Dyspnea on exertion   Coronary artery disease involving native coronary artery of native heart with angina pectoris ([place])   S/P Maze operation for atrial fibrillation   Acute respiratory failure with hypercapnia (HCC)   Acute renal failure (HCC)   Cardiogenic shock (HCC)   Spontaneous pneumothorax   Hx of CABG   S/P CABG (coronary artery bypass graft)   Acute blood loss anemia   Anemia of chronic disease   Chronic obstructive pulmonary disease (HCC)   CURRENT MEDS:   . amiodarone  400 mg Per Tube Q12H  . aspirin  81 mg Per Tube Daily  . chlorhexidine gluconate (MEDLINE KIT)  15 mL Mouth Rinse BID  . Chlorhexidine Gluconate Cloth  6 each Topical Q0600  . enoxaparin (LOVENOX) injection  30 mg Subcutaneous Q48H  . feeding supplement (PRO-STAT SUGAR FREE 64)  30 mL Per Tube TID  . folic acid  1 mg Per Tube Daily  . Gerhardt's butt cream   Topical BID  . insulin aspart  0-24 Units Subcutaneous Q4H  . insulin detemir  20 Units Subcutaneous Daily  . levothyroxine  25 mcg Per Tube QAC breakfast  . mouth rinse  15 mL Mouth Rinse q12n4p  . moving right along book   Does not apply Once  .  multivitamin  15 mL Per Tube Daily  . pantoprazole sodium  40 mg Per Tube Daily  . patient's guide to using coumadin book   Does not apply Once  . sodium chloride flush  10-40 mL Intracatheter Q12H  . sodium chloride flush  3 mL Intravenous Q12H  . sodium chloride flush  3 mL Intravenous Q12H  . traZODone  25 mg Per Tube QHS  . warfarin  2.5 mg Per Tube q1800  . Warfarin - Physician Dosing Inpatient   Does not apply [place]: [phone] Office: [phone] 10/29/2016

## 2016-10-29 NOTE — Progress Notes (Signed)
      [address]       [place] [address]             [phone]        CARDIOTHORACIC SURGERY PROGRESS NOTE   R20 Days Post-Op Procedure(s) (LRB): CORONARY ARTERY BYPASS GRAFTING (CABG) x two, using left internal mammary artery and right leg greater saphenous vein harvested endoscopically (N/A) MAZE (N/A) TRANSESOPHAGEAL ECHOCARDIOGRAM (TEE) (N/A) CLIPPING OF ATRIAL APPENDAGE   R1 Day Post-Op Procedure(s) (LRB): ARTERIOVENOUS  FISTULA CREATION right arm (Right) INSERTION OF DIALYSIS CATHETER (N/A)  Subjective: Looks good.  Denies complaints other than feels weak.  Eating some ice cream.  Looking forward to swallowing study  Objective: Vital signs: BP Readings from Last 1 Encounters:  10/29/16 (!) 95/43   Pulse Readings from Last 1 Encounters:  10/29/16 96   Resp Readings from Last 1 Encounters:  10/29/16 17   Temp Readings from Last 1 Encounters:  10/29/16 98.2 F (36.8 C) (Oral)    Hemodynamics:    Physical Exam:  Rhythm:   sinus  Breath sounds: clear  Heart sounds:  RRR  Incisions:  Clean and dry  Abdomen:  Soft, non-distended, non-tender  Extremities:  Warm, well-perfused   Intake/Output from previous day: 10/16 0701 - 10/17 0700 In: 1250 [I.V.:700; NG/GT:535] Out: 265 [Stool:255; Blood:10] Intake/Output this shift: No intake/output data recorded.  Lab Results:  CBC: Recent Labs  10/28/16 0526 10/29/16 0557  WBC 12.5* 12.9*  HGB 10.3* 10.8*  HCT 31.6* 33.7*  PLT 215 230    BMET:  Recent Labs  10/28/16 0526 10/29/16 0557  NA 137 136  K 3.8 4.0  CL 100* 100*  CO2 27 25  GLUCOSE 78 203*  BUN 37* 59*  CREATININE 2.96* 4.16*  CALCIUM 8.1* 8.0*     PT/INR:   Recent Labs  10/29/16 0557  LABPROT 14.9  INR 1.18    CBG (last 3)   Recent Labs  10/28/16 2353 10/29/16 0345 10/29/16 0754  GLUCAP 202* 184* 261*    ABG    Component Value Date/Time   PHART 7.417 10/20/2016 0337   PCO2ART 42.7 10/20/2016  0337   PO2ART 76.0 (L) 10/20/2016 0337   HCO3 27.5 10/20/2016 0337   TCO2 26 10/27/2016 1632   ACIDBASEDEF 1.0 10/17/2016 0007   O2SAT 71.5 10/20/2016 0345    CXR: PORTABLE CHEST 1 VIEW  COMPARISON:  10/28/2016  FINDINGS: Prior CABG. Right dialysis catheter and feeding tube remain in place, unchanged. Cardiomegaly with bilateral interstitial and alveolar opacities, slightly worsening since prior study, likely worsening CHF. No visible significant effusions.  IMPRESSION: Increasing interstitial and alveolar opacities diffusely, likely worsening edema/ CHF.   Electronically Signed   By: Rolm Baptise M.D.   On: 10/29/2016 08:08   Assessment/Plan:  Clinically stable Maintaining NSR w/ stable BP Breathing comfortably on room air Remains in oliguric renal failure Remains very weak and deconditioned, but improving daily Oral intake improving - hopefully can relax dysphagia precautions - await MBS Watery bowel movements persist, likely related to tube feeds CBG's back up somewhat since tube feeds resumed   HD and management of renal failure per Nephrology team  MBS today  Continue tube feeds for now but hopefully can stop soon  Mobilize  PT  Coumadin  PMR consult for possible d/c to [place], MD 10/29/2016 9:03 AM

## 2016-10-29 NOTE — Progress Notes (Signed)
CSW continuing to follow and assist with disposition as needed  Lyndsey Demos H. Travarius Lange, LCSW Clinical Social Worker [phone]  

## 2016-10-29 NOTE — Telephone Encounter (Signed)
-----   Message from Mena Goes, RN sent at 10/28/2016  3:58 PM EDT ----- Regarding: 6-7 weeks with duplex   ----- Message ----- From: Ulyses Amor, PA-C Sent: 10/28/2016   3:51 PM To: Vvs Charge Pool  S/P fistula creation with Dr. Scot Dock needs f/u in 6-7 weeks with fistula duplex

## 2016-10-29 NOTE — Progress Notes (Signed)
Patient ID: [name], male   DOB: 07-Jan-1931, 83 y.o.   MRN: [id number] S:Still not sleeping well O:BP (!) 95/43   Pulse 96   Temp 98.2 F (36.8 C) (Oral)   Resp 17   Ht 5' 8"  (1.727 m)   Wt 92.1 kg (203 lb)   SpO2 100%   BMI 30.87 kg/m   Intake/Output Summary (Last 24 hours) at 10/29/16 0906 Last data filed at 10/29/16 0700  Gross per 24 hour  Intake             1250 ml  Output              265 ml  Net              985 ml   Intake/Output: I/O last 3 completed shifts: In: 1494.7 [I.V.:700; Other:15; NG/GT:779.7] Out: 565 [Stool:555; Blood:10]  Intake/Output this shift:  No intake/output data recorded. Weight change: -2.72 kg (-5 lb 15.9 oz) Gen: NAD CVS: no rub Resp:cta Abd: benign Ext:no edema, RUE AVF +T/B   Recent Labs Lab 10/22/16 1512  10/23/16 0441  10/23/16 1552  10/24/16 0725  10/24/16 1600 10/25/16 0428 10/25/16 1509 10/26/16 0348 10/26/16 1621 10/27/16 0400 10/27/16 1624 10/27/16 1632 10/28/16 0526 10/29/16 0557  NA 138  < > 139  < > 139  --  142  < > 142  --  137  --  137 135 136 137 137 136  K 3.5  < > 3.4*  < > 3.4*  --  3.3*  < > 3.0*  --  3.1*  --  3.1* 3.2* 3.4* 3.5 3.8 4.0  CL 104  < > 103  < > 103  --  109  < > 108  --  105  --  96* 100* 99* 96* 100* 100*  CO2 26  --  24  --  23  --  21*  --  23  --  23  --   --  25 29  --  27 25  GLUCOSE 158*  < > 197*  < > 147*  --  188*  < > 130*  --  186*  --  60* 131* 70 66 78 203*  BUN 37*  < > 45*  < > 57*  --  75*  < > 83*  --  99*  --  56* 72* 20 20 37* 59*  CREATININE 1.76*  < > 1.96*  < > 2.36*  --  3.20*  < > 3.47*  --  4.01*  --  3.30* 3.87* 1.81* 1.60* 2.96* 4.16*  ALBUMIN 2.0*  --  2.1*  --  2.2*  --   --   --  2.0*  --  2.0*  --   --   --  2.2*  --  2.2*  --   CALCIUM 7.7*  --  8.1*  --  8.1*  --  8.1*  --  8.0*  --  7.7*  --   --  7.8* 7.6*  --  8.1* 8.0*  PHOS 1.1*  --  1.6*  --  2.1*  < >  --   --  2.2* 3.1 2.8 2.4*  --  3.1 1.7*  --  4.0 4.8*  AST  --   --  54*  --   --   --   --   --    --   --   --   --   --   --   --   --  21  --   ALT  --   --  143*  --   --   --   --   --   --   --   --   --   --   --   --   --  41  --   < > = values in this interval not displayed. Liver Function Tests:  Recent Labs Lab 10/23/16 0441  10/25/16 1509 10/27/16 1624 10/28/16 0526  AST 54*  --   --   --  21  ALT 143*  --   --   --  41  ALKPHOS 287*  --   --   --  144*  BILITOT 1.1  --   --   --  0.6  PROT 5.3*  --   --   --  5.5*  ALBUMIN 2.1*  < > 2.0* 2.2* 2.2*  < > = values in this interval not displayed. No results for input(s): LIPASE, AMYLASE in the last 168 hours. No results for input(s): AMMONIA in the last 168 hours. CBC:  Recent Labs Lab 10/24/16 0725  10/25/16 1527  10/27/16 0400 10/27/16 1632 10/28/16 0526 10/29/16 0557  WBC 19.7*  --  12.8*  --  10.5  --  12.5* 12.9*  HGB 8.4*  < > 7.9*  < > 7.3* 11.2* 10.3* 10.8*  HCT 26.5*  < > 25.0*  < > 22.5* 33.0* 31.6* 33.7*  MCV 93.3  --  94.0  --  93.4  --  91.3 92.8  PLT 198  --  175  --  193  --  215 230  < > = values in this interval not displayed. Cardiac Enzymes: No results for input(s): CKTOTAL, CKMB, CKMBINDEX, TROPONINI in the last 168 hours. CBG:  Recent Labs Lab 10/28/16 1615 10/28/16 1954 10/28/16 2353 10/29/16 0345 10/29/16 0754  GLUCAP 78 186* 202* 184* 261*    Iron Studies: No results for input(s): IRON, TIBC, TRANSFERRIN, FERRITIN in the last 72 hours. Studies/Results: Dg Chest Port 1 View  Result Date: 10/29/2016 CLINICAL DATA:  Shortness of breath, CHF EXAM: PORTABLE CHEST 1 VIEW COMPARISON:  10/28/2016 FINDINGS: Prior CABG. Right dialysis catheter and feeding tube remain in place, unchanged. Cardiomegaly with bilateral interstitial and alveolar opacities, slightly worsening since prior study, likely worsening CHF. No visible significant effusions. IMPRESSION: Increasing interstitial and alveolar opacities diffusely, likely worsening edema/ CHF. Electronically Signed   By: Rolm Baptise  M.D.   On: 10/29/2016 08:08   Dg Chest Port 1v Same Day  Result Date: 10/28/2016 CLINICAL DATA:  Dialysis catheter placement EXAM: PORTABLE CHEST 1 VIEW COMPARISON:  10/27/2016 and priors FINDINGS: Stable cardiomegaly with aortic atherosclerosis. Atrial appendage clip is again seen with post CABG change.New right IJ dialysis catheter is noted with tip at the cavoatrial junction. Gastric tube extends below the left hemidiaphragm into the expected location of the stomach. Interval removal of right IJ and left subclavian catheters. No pneumothorax. Chronic interstitial prominence is seen bilaterally. No overt pulmonary edema, effusion or pulmonary consolidations. IMPRESSION: 1. Stable cardiomegaly with post CABG change and aortic atherosclerosis. 2. New right IJ approach dialysis catheter tip is seen at the cavoatrial junction without evidence of pneumothorax. 3. Chronic appearing subpleural areas of interstitial prominence some which may represent areas of fibrosis and/or scarring. Electronically Signed   By: Ashley Royalty M.D.   On: 10/28/2016 17:47   Dg Fluoro Guide Cv Line-no Report  Result Date: 10/28/2016 Fluoroscopy was utilized  by the requesting physician.  No radiographic interpretation.   [name] amiodarone  400 mg Per Tube Q12H  . aspirin  81 mg Per Tube Daily  . chlorhexidine gluconate (MEDLINE KIT)  15 mL Mouth Rinse BID  . Chlorhexidine Gluconate Cloth  6 each Topical Q0600  . enoxaparin (LOVENOX) injection  30 mg Subcutaneous Q48H  . feeding supplement (PRO-STAT SUGAR FREE 64)  30 mL Per Tube TID  . folic acid  1 mg Per Tube Daily  . Gerhardt's butt cream   Topical BID  . insulin aspart  0-24 Units Subcutaneous Q4H  . insulin detemir  20 Units Subcutaneous Daily  . levothyroxine  25 mcg Per Tube QAC breakfast  . mouth rinse  15 mL Mouth Rinse q12n4p  . moving right along book   Does not apply Once  . multivitamin  15 mL Per Tube Daily  . pantoprazole sodium  40 mg Per Tube Daily  .  patient's guide to using coumadin book   Does not apply Once  . sodium chloride flush  10-40 mL Intracatheter Q12H  . sodium chloride flush  3 mL Intravenous Q12H  . sodium chloride flush  3 mL Intravenous Q12H  . traZODone  25 mg Per Tube QHS  . warfarin  2.5 mg Per Tube q1800  . Warfarin - Physician Dosing Inpatient   Does not apply q1800    BMET    Component Value Date/Time   NA 136 10/29/2016 0557   K 4.0 10/29/2016 0557   CL 100 (L) 10/29/2016 0557   CO2 25 10/29/2016 0557   GLUCOSE 203 (H) 10/29/2016 0557   BUN 59 (H) 10/29/2016 0557   CREATININE 4.16 (H) 10/29/2016 0557   CALCIUM 8.0 (L) 10/29/2016 0557   GFRNONAA 12 (L) 10/29/2016 0557   GFRAA 14 (L) 10/29/2016 0557   CBC    Component Value Date/Time   WBC 12.9 (H) 10/29/2016 0557   RBC 3.63 (L) 10/29/2016 0557   HGB 10.8 (L) 10/29/2016 0557   HCT 33.7 (L) 10/29/2016 0557   PLT 230 10/29/2016 0557   MCV 92.8 10/29/2016 0557   MCH 29.8 10/29/2016 0557   MCHC 32.0 10/29/2016 0557   RDW 17.8 (H) 10/29/2016 0557   LYMPHSABS 1.1 10/21/2016 0303   MONOABS 1.3 (H) 10/21/2016 0303   EOSABS 0.4 10/21/2016 0303   BASOSABS 0.0 10/21/2016 0303    Assessment/Plan:  1. AKI/CKD stage 3-4- has been oliguric and requiring CVVHD/IHD. CVVHD initiated 10/15/16 and stopped 10/25/16 and tolerated IHD well on 10/27/16. 1. Tolerated IHD well and continue with MWF schedule for now and follow for possible recovery of renal function.  2. CAD s/p CABG x 2 (LIMA, and right SVG) and MAZE procedure 10/09/16. Doing better and off of pressors 3. Anemia of CKD with ABLA- on ESA and transfuse prn 1. feraheme 10/15/16 and again 10/22/16 4. Vascular access- s/p TDC and LUE right BC AVF placed on 10/28/16.  Appreciate Dr. Nicole Cella assistance 5. Severe debilitation- continue with PT/OT and will likely require CIR/SNF once stable for discharge.  Refused PT yesterday but encouraged to work with them today. 6. DM- per primary 7. Hypokalemia- use  4K bath with HD. 8. Insomnia- now on trazadone qhs and not prn 9. Itching- phos stable,  SARNA lotion prn.  Donetta Potts, MD [employer] [phone]

## 2016-10-29 NOTE — Anesthesia Postprocedure Evaluation (Signed)
Anesthesia Post Note  Patient: [name]  Procedure(s) Performed: ARTERIOVENOUS  FISTULA CREATION right arm (Right Arm Lower) INSERTION OF DIALYSIS CATHETER (N/A )     Patient location during evaluation: PACU Anesthesia Type: MAC Level of consciousness: awake and sedated Pain management: pain level controlled Vital Signs Assessment: post-procedure vital signs reviewed and stable Respiratory status: spontaneous breathing, nonlabored ventilation, respiratory function stable and patient connected to nasal cannula oxygen Cardiovascular status: stable and blood pressure returned to baseline Postop Assessment: no apparent nausea or vomiting Anesthetic complications: no    Last Vitals:  Vitals:   10/29/16 0700 10/29/16 0757  BP: (!) 95/43   Pulse: 96   Resp: 17   Temp:  36.8 C  SpO2: 100%     Last Pain:  Vitals:   10/29/16 0757  TempSrc: Oral  PainSc:                  Vihan Santagata,JAMES TERRILL

## 2016-10-29 NOTE — Progress Notes (Signed)
Physical Therapy Treatment Patient Details Name: [name] MRN: [id number] DOB: [birth year] Today's Date: 10/29/2016    History of Present Illness Pt admit for CABGx2 and MAZE procedure on 9/27.  He developed worsening renal failure post operatively requiring CRRT, Intubation 9/27-10/8.   On 10/3 he developed shock in the setting of a spontaneous R sided pneumothorax.  PMHx: hypertension, chronic diastolic congestive heart failure, stage IV chronic kidney disease, hyperlipidemia, recurrent PAF on long-term anticoagulation, bronchiectasis with chronic dyspnea on exertion, COPD, asthma, RA, DM, and anemia     PT Comments    Pt admitted with above diagnosis. Pt currently with functional limitations due to the deficits listed below (see PT Problem List). Pt was able to ambulate in hallway with+2 mod assist and mod cues for safety as well as chair follow.  Pt progressed distance with max encouragement.  Want to consult Rehab as pt with good family support and is progressing.  Will continue acute PT.    Pt will benefit from skilled PT to increase their independence and safety with mobility to allow discharge to the venue listed below.     Follow Up Recommendations  Supervision/Assistance - 24 hour;CIR     Equipment Recommendations  Other (comment) (TBA)    Recommendations for Other Services       Precautions / Restrictions Precautions Precautions: Fall;Sternal Restrictions Weight Bearing Restrictions: Yes RUE Weight Bearing: Weight bearing as tolerated    Mobility  Bed Mobility Overal bed mobility: Needs Assistance Bed Mobility: Sit to Supine       Sit to supine: Mod assist   General bed mobility comments: OOB in chair on arrival.  Mod assist to lie down as pt needed assist for trunk and LEs  Transfers Overall transfer level: Needs assistance Equipment used:  (EVA walker) Transfers: Sit to/from Stand Sit to Stand: Mod assist;+2 physical assistance (3rd person for chair  follow)         General transfer comment: modAx2 for power up varying but even with min assist for power up mod cues needed for safety and used pad each time to assist with lift.  Assist and cues for steadying and reaching to EVA walker.  Nursing did clean pts bottom last stand and pivot to chair as he had some stool leakage from flexi seal.   Ambulation/Gait Ambulation/Gait assistance: Mod assist;+2 physical assistance (3rd person for chair follow) Ambulation Distance (Feet): 46 Feet (6 feet and 40) Assistive device:  (EVA walker) Gait Pattern/deviations: Step-through pattern;Decreased step length - right;Decreased step length - left;Trunk flexed;Antalgic;Narrow base of support;Drifts right/left;Shuffle Gait velocity: slowed Gait velocity interpretation: Below normal speed for age/gender General Gait Details: modA for steadying with gait with mod cues to sequence steps and eva walker, vc for encouragement, anterior pelvic tilt, and looking up and out with gait.  Pt also followed with chair and took 2 seated rest breaks.     Stairs            Wheelchair Mobility    Modified Rankin (Stroke Patients Only)       Balance Overall balance assessment: Needs assistance Sitting-balance support: Feet supported;No upper extremity supported Sitting balance-Leahy Scale: Fair     Standing balance support: Bilateral upper extremity supported;During functional activity Standing balance-Leahy Scale: Poor Standing balance comment: relies on UE support.                             Cognition Arousal/Alertness: Awake/alert Behavior During  Therapy: Flat affect Overall Cognitive Status: Impaired/Different from baseline Area of Impairment: Orientation;Attention;Memory;Following commands                 Orientation Level: Disoriented to;Time Current Attention Level: Selective Memory: Decreased recall of precautions;Decreased short-term memory Following Commands: Follows  multi-step commands consistently Safety/Judgement: Decreased awareness of safety;Decreased awareness of deficits   Problem Solving: Slow processing;Requires verbal cues;Requires tactile cues;Decreased initiation;Difficulty sequencing        Exercises General Exercises - Lower Extremity Long Arc Quad: AAROM;Both;Seated;5 reps    General Comments General comments (skin integrity, edema, etc.): VSS throughout      Pertinent Vitals/Pain Pain Assessment: Faces Faces Pain Scale: Hurts a little bit Pain Location: chest incision Pain Descriptors / Indicators: Discomfort Pain Intervention(s): Limited activity within patient's tolerance;Monitored during session;Repositioned    Home Living                      Prior Function            PT Goals (current goals can now be found in the care plan section) Acute Rehab PT Goals Patient Stated Goal: to get better Progress towards PT goals: Progressing toward goals    Frequency    Min 3X/week      PT Plan Discharge plan needs to be updated    Co-evaluation              AM-PAC PT "6 Clicks" Daily Activity  Outcome Measure  Difficulty turning over in bed (including adjusting bedclothes, sheets and blankets)?: Unable Difficulty moving from lying on back to sitting on the side of the bed? : Unable Difficulty sitting down on and standing up from a chair with arms (e.g., wheelchair, bedside commode, etc,.)?: A Lot Help needed moving to and from a bed to chair (including a wheelchair)?: A Lot Help needed walking in hospital room?: A Lot Help needed climbing 3-5 steps with a railing? : Total 6 Click Score: 9    End of Session Equipment Utilized During Treatment: Gait belt Activity Tolerance: Patient limited by fatigue (limited by nausea - nurse brought nausea med) Patient left: in chair;with call bell/phone within reach;with family/visitor present Nurse Communication: Mobility status;Precautions PT Visit Diagnosis:  Muscle weakness (generalized) (M62.81);Other abnormalities of gait and mobility (R26.89) Pain - part of body:  (chest)     Time: 0900-0930 PT Time Calculation (min) (ACUTE ONLY): 30 min  Charges:  $Gait Training: 8-22 mins                    G Codes:       [place] [phone] (pager)    Denice Paradise 10/29/2016, 9:43 AM

## 2016-10-30 LAB — BASIC METABOLIC PANEL
Anion gap: 8 (ref 5–15)
BUN: 32 mg/dL — AB (ref 6–20)
CALCIUM: 7.7 mg/dL — AB (ref 8.9–10.3)
CO2: 27 mmol/L (ref 22–32)
CREATININE: 2.99 mg/dL — AB (ref 0.61–1.24)
Chloride: 100 mmol/L — ABNORMAL LOW (ref 101–111)
GFR calc Af Amer: 21 mL/min — ABNORMAL LOW (ref >60)
GFR, EST NON AFRICAN AMERICAN: 18 mL/min — AB (ref >60)
GLUCOSE: 160 mg/dL — AB (ref 65–99)
Potassium: 3.9 mmol/L (ref 3.5–5.1)
SODIUM: 135 mmol/L (ref 135–145)

## 2016-10-30 LAB — GLUCOSE, CAPILLARY
GLUCOSE-CAPILLARY: 224 mg/dL — AB (ref 65–99)
Glucose-Capillary: 136 mg/dL — ABNORMAL HIGH (ref 65–99)
Glucose-Capillary: 141 mg/dL — ABNORMAL HIGH (ref 65–99)
Glucose-Capillary: 161 mg/dL — ABNORMAL HIGH (ref 65–99)
Glucose-Capillary: 175 mg/dL — ABNORMAL HIGH (ref 65–99)
Glucose-Capillary: 202 mg/dL — ABNORMAL HIGH (ref 65–99)
Glucose-Capillary: 273 mg/dL — ABNORMAL HIGH (ref 65–99)

## 2016-10-30 LAB — CBC
HCT: 32.2 % — ABNORMAL LOW (ref 39.0–52.0)
Hemoglobin: 10.2 g/dL — ABNORMAL LOW (ref 13.0–17.0)
MCH: 29.3 pg (ref 26.0–34.0)
MCHC: 31.7 g/dL (ref 30.0–36.0)
MCV: 92.5 fL (ref 78.0–100.0)
PLATELETS: 196 10*3/uL (ref 150–400)
RBC: 3.48 MIL/uL — ABNORMAL LOW (ref 4.22–5.81)
RDW: 17.3 % — AB (ref 11.5–15.5)
WBC: 10.2 10*3/uL (ref 4.0–10.5)

## 2016-10-30 LAB — PROTIME-INR
INR: 1.2
Prothrombin Time: 15.1 seconds (ref 11.4–15.2)

## 2016-10-30 LAB — HEPATITIS B CORE ANTIBODY, IGM: HEP B C IGM: NEGATIVE

## 2016-10-30 LAB — PHOSPHORUS: PHOSPHORUS: 3.4 mg/dL (ref 2.5–4.6)

## 2016-10-30 LAB — HEPATITIS B SURFACE ANTIBODY,QUALITATIVE: HEP B S AB: NONREACTIVE

## 2016-10-30 MED ORDER — TRAMADOL HCL 50 MG PO TABS
50.0000 mg | ORAL_TABLET | Freq: Two times a day (BID) | ORAL | Status: DC | PRN
  Administered 2016-11-01 – 2016-11-02 (×3): 50 mg via ORAL
  Filled 2016-10-30 (×2): qty 1

## 2016-10-30 MED ORDER — PANTOPRAZOLE SODIUM 40 MG PO PACK
40.0000 mg | PACK | Freq: Every day | ORAL | Status: DC
  Administered 2016-10-30: 40 mg via ORAL
  Filled 2016-10-30: qty 20

## 2016-10-30 MED ORDER — WARFARIN SODIUM 2.5 MG PO TABS
2.5000 mg | ORAL_TABLET | Freq: Every day | ORAL | Status: DC
  Administered 2016-10-30: 2.5 mg via ORAL
  Filled 2016-10-30: qty 1

## 2016-10-30 MED ORDER — ACETAMINOPHEN 325 MG PO TABS
650.0000 mg | ORAL_TABLET | Freq: Four times a day (QID) | ORAL | Status: DC | PRN
  Administered 2016-11-02: 650 mg via ORAL
  Filled 2016-10-30: qty 2

## 2016-10-30 MED ORDER — CHLORHEXIDINE GLUCONATE CLOTH 2 % EX PADS
6.0000 | MEDICATED_PAD | Freq: Every day | CUTANEOUS | Status: DC
  Administered 2016-10-30 – 2016-11-03 (×5): 6 via TOPICAL

## 2016-10-30 MED ORDER — MOVING RIGHT ALONG BOOK
Freq: Once | Status: AC
  Administered 2016-10-30: 07:00:00
  Filled 2016-10-30: qty 1

## 2016-10-30 MED ORDER — ALBUMIN HUMAN 5 % IV SOLN
INTRAVENOUS | Status: AC
  Filled 2016-10-30: qty 250

## 2016-10-30 MED ORDER — AMIODARONE HCL IN DEXTROSE 360-4.14 MG/200ML-% IV SOLN
30.0000 mg/h | INTRAVENOUS | Status: AC
  Administered 2016-10-30: 30 mg/h via INTRAVENOUS
  Filled 2016-10-30: qty 200

## 2016-10-30 MED ORDER — ALBUMIN HUMAN 5 % IV SOLN
INTRAVENOUS | Status: AC
  Administered 2016-10-30: 12.5 g via INTRAVENOUS
  Filled 2016-10-30: qty 250

## 2016-10-30 MED ORDER — AMIODARONE HCL IN DEXTROSE 360-4.14 MG/200ML-% IV SOLN
INTRAVENOUS | Status: AC
  Filled 2016-10-30: qty 200

## 2016-10-30 MED ORDER — INSULIN DETEMIR 100 UNIT/ML ~~LOC~~ SOLN
28.0000 [IU] | Freq: Two times a day (BID) | SUBCUTANEOUS | Status: DC
  Administered 2016-10-30: 28 [IU] via SUBCUTANEOUS
  Filled 2016-10-30 (×2): qty 0.28

## 2016-10-30 MED ORDER — ALBUMIN HUMAN 5 % IV SOLN
12.5000 g | Freq: Once | INTRAVENOUS | Status: AC
  Administered 2016-10-30: 12.5 g via INTRAVENOUS

## 2016-10-30 MED ORDER — ALBUMIN HUMAN 25 % IV SOLN: 12.5000 g | Freq: Once | INTRAVENOUS | Status: DC

## 2016-10-30 MED ORDER — AMIODARONE HCL 200 MG PO TABS
200.0000 mg | ORAL_TABLET | Freq: Two times a day (BID) | ORAL | Status: DC
  Administered 2016-10-30 (×2): 200 mg via ORAL
  Filled 2016-10-30 (×2): qty 1

## 2016-10-30 MED ORDER — ORAL CARE MOUTH RINSE
15.0000 mL | Freq: Two times a day (BID) | OROMUCOSAL | Status: DC
  Administered 2016-10-31 – 2016-11-03 (×6): 15 mL via OROMUCOSAL

## 2016-10-30 NOTE — Progress Notes (Signed)
This RN called HD to follow up with pickup time as well as making them aware that his SBP has been running low 85-95. HD RN informed this RN that he treatment was cancelled d/t a schedule mix up. Pt to remain on MWF schedule.

## 2016-10-30 NOTE — Progress Notes (Signed)
RN observed pt's BP trending down over last several hours. No bp medications given since 0900, cuff adjusted with no change. RN assessed pt's rhythm and observed potential change to AF. EKG obtained and confirmed AF rate controled at 80-90. Dr. Prescott Gum paged and updated. Verbal orders given for one albumin, to give PM dose of amio now, and to begin amio gtt at 30mg  for 4hrs. Orders placed. Pt alert and oriented. Will continue to monitor and assess pt closely.

## 2016-10-30 NOTE — Progress Notes (Signed)
I met with pt and his son, [name], at bedside to discuss an inpt rehab admission goals and expectations. Pt had previously refused admit when I met with him and was requesting [facility] in [place]. He is now in agreement. I will discuss with my MD and follow up tomorrow for possible admission pending that discussion and bed availability. [phone]

## 2016-10-30 NOTE — PMR Pre-admission (Signed)
PMR Admission Coordinator Pre-Admission Assessment  Patient: [name] is an 83 y.o., male MRN: [id number] DOB: March 18, 1930 Height: 5\' 8"  (172.7 cm) Weight: 92.6 kg (204 lb 2.3 oz)              Insurance Information HMO:     PPO:      PCP:      IPA:      80/20: yes     OTHER: no HMO PRIMARY: Medicare a and b      Policy#: [id number]      Subscriber: pt Benefits:  Phone #: passport one online     Name: 10/30/16 Eff. Date: 10/13/97     Deduct: $1340      Out of Pocket Max: none      Life Max: none CIR: 100%      SNF: 20 full days Outpatient: 80%     Co-Pay: 20% Home Health: 100%      Co-Pay: none DME: 80%     Co-Pay: 20% Providers: pt choice  SECONDARY: [employer]      Policy#: [id number]      Subscriber: pt  Medicaid Application Date:       Case Manager:  Disability Application Date:       Case Worker:   Emergency [place]    Name Relation Home Work Mobile   Peak,Edreen Daughter   [phone]   Diamante, Rubin Spouse [phone]  [phone]   Vegas, Fritze   [phone]   Hoyle Barr Daughter   [phone]     Current Medical History  Patient Admitting Diagnosis: debility  History of Present Illness: HPI: [name] a 83 y.o.right handed malewith history of hypertension, chronic diastolic congestive heart failure, stage IV chronic renal insufficiency, recurrent PAF on long-term anticoagulation, COPD with remote tobacco abuse, rheumatoid arthritis and diabetes mellitus. Presented 10/08/2016 with progressive shortness of breath. Noninvasive echocardiogram with ejection fraction of ~45% no signs of ischemia. Elective cardiac catheterization with noted CAD. Underwent CABG/MAZE procedure 10/09/2016 per Dr. Roxy Manns. Hospital course pain management. Sternal precautions as directed. Nephrology consulted for AKI on CKD. Patient has been receiving hemodialysis Monday Wednesday Friday schedule and monitoring for possible  recovery of renal function with latest creatinine 2.99 . Patient did undergo right IJ tunneled dialysis catheter 10/28/2016 per Dr. Scot Dock as well as right AV fistula. Chronic Coumadin therapy has been resumed. Acute on chronic anemia 10.2 and monitored.[name] MBS completed 10/29/2016 currently on a dysphagia #3 honey thick liquid diet.   Past Medical History  Past Medical History:  Diagnosis Date  . Anemia   . Asthma   . Atrial fibrillation ([place]) 12/08/2013  . CHF (congestive heart failure) ([place])   . Chronic joint pain    "from RA" (10/01/2016)  . CKD (chronic kidney disease), stage III ([place])   . Constipation    takes stool softener daily  . COPD (chronic obstructive pulmonary disease) ([place])   . Coronary artery disease   . Coronary artery disease involving native coronary artery of native heart with angina pectoris ([place]) 09/30/2016  . Dysrhythmia    a-fib  . GERD (gastroesophageal reflux disease)    takes Zantac daily   . History of blood transfusion    "related to one of my ORs"  . Hyperlipidemia    takes Pravastatin daily  . Hypertension    takes Cardizem,Proscar,and Lisinopril daily  . Hypothyroidism   . Impaired hearing    wears hearing aids  . Joint swelling    "  from RA" (10/01/2016)  . Peripheral neuropathy   . Pneumonia 2017  . Rheumatoid arthritis ([place])    "all over; take orencia  q 4 wks" (10/01/2016)  . S/P CABG x 2 10/09/2016   LIMA to LAD, SVG to OM, EVH via right thigh  . S/P Maze operation for atrial fibrillation 10/09/2016   Complete bilateral atrial lesion set using bipolar radiofrequency and cryothermy ablation with clipping of LA appendage  . Type II diabetes mellitus (H[facility])     Family History  family history includes Aneurysm in his father; Cancer in his mother.  Prior Rehab/Hospitalizations:  Has the patient had major surgery during 100 days prior to admission? No  Current Medications   Current Facility-Administered Medications:  .  0.9 %  sodium chloride  infusion, 250 mL, Intravenous, Continuous, Darylene Price H, MD .  0.9 %  sodium chloride infusion, 250 mL, Intravenous, PRN, Rexene Alberts, MD .  acetaminophen (TYLENOL) tablet 650 mg, 650 mg, Oral, Q6H PRN, Rexene Alberts, MD, 650 mg at 11/02/16 0012 .  amiodarone (PACERONE) tablet 200 mg, 200 mg, Oral, Q12H, Rexene Alberts, MD .  aspirin chewable tablet 81 mg, 81 mg, Per Tube, Daily, Einar Grad, RPH, 81 mg at 11/03/16 7628 .  atorvastatin (LIPITOR) tablet 40 mg, 40 mg, Oral, q1800, Rexene Alberts, MD, 40 mg at 11/02/16 1717 .  camphor-menthol (SARNA) lotion, , Topical, PRN, Donato Heinz, MD .  Chlorhexidine Gluconate Cloth 2 % PADS 6 each, 6 each, Topical, Q0600, Rexene Alberts, MD, 6 each at 11/03/16 1000 .  diphenhydrAMINE (BENADRYL) capsule 25 mg, 25 mg, Oral, Q6H PRN, Rexene Alberts, MD, 25 mg at 11/03/16 0950 .  enoxaparin (LOVENOX) injection 30 mg, 30 mg, Subcutaneous, Q48H, Rexene Alberts, MD, 30 mg at 11/03/16 0836 .  feeding supplement (GLUCERNA SHAKE) (GLUCERNA SHAKE) liquid 237 mL, 237 mL, Oral, BID BM, Rexene Alberts, MD, 237 mL at 11/02/16 1348 .  finasteride (PROSCAR) tablet 5 mg, 5 mg, Oral, QHS, Rexene Alberts, MD, 5 mg at 11/02/16 2121 .  gabapentin (NEURONTIN) capsule 300 mg, 300 mg, Oral, BID, Rexene Alberts, MD, 300 mg at 11/03/16 0837 .  Gerhardt's butt cream, , Topical, BID, Rexene Alberts, MD .  guaiFENesin (ROBITUSSIN) 100 MG/5ML solution 400 mg, 20 mL, Oral, Q4H PRN, Rexene Alberts, MD, 400 mg at 11/03/16 0344 .  hydrocortisone cream 0.5 %, , Topical, BID, Rexene Alberts, MD .  insulin aspart (novoLOG) injection 0-15 Units, 0-15 Units, Subcutaneous, TID WC, Rexene Alberts, MD, 2 Units at 11/03/16 [phone] .  insulin detemir (LEVEMIR) injection 28 Units, 28 Units, Subcutaneous, Daily, Rexene Alberts, MD, 28 Units at 11/03/16 [phone] .  levothyroxine (SYNTHROID, LEVOTHROID) tablet 25 mcg, 25 mcg, Per Tube, QAC breakfast, Einar Grad, RPH, 25 mcg at 11/03/16 0737 .  MEDLINE mouth rinse, 15 mL, Mouth Rinse, BID, Rexene Alberts, MD, 15 mL at 11/03/16 1000 .  metoprolol tartrate (LOPRESSOR) tablet 12.5 mg, 12.5 mg, Oral, BID, Rexene Alberts, MD, 12.5 mg at 11/03/16 0837 .  moving right along book, , Does not apply, Once, Rexene Alberts, MD .  multivitamin (RENA-VIT) tablet 1 tablet, 1 tablet, Oral, QHS, Rexene Alberts, MD, 1 tablet at 11/02/16 2121 .  ondansetron (ZOFRAN) injection 4 mg, 4 mg, Intravenous, Q6H PRN, Rexene Alberts, MD, 4 mg at 10/31/16 2109 .  pantoprazole (PROTONIX) EC tablet 40 mg, 40 mg, Oral, Daily, Roxy Manns,  Valentina Gu, MD, 40 mg at 11/03/16 7829 .  patient's guide to using coumadin book, , Does not apply, Once, Lyndee Leo, [facility] .  RESOURCE THICKENUP CLEAR, , Oral, PRN, Rexene Alberts, MD .  sodium chloride flush (NS) 0.9 % injection 3 mL, 3 mL, Intravenous, Q12H, Rexene Alberts, MD, 3 mL at 11/02/16 2200 .  sodium chloride flush (NS) 0.9 % injection 3 mL, 3 mL, Intravenous, PRN, Rexene Alberts, MD, 3 mL at 10/13/16 1100 .  tamsulosin (FLOMAX) capsule 0.4 mg, 0.4 mg, Oral, QHS, Rexene Alberts, MD, 0.4 mg at 11/02/16 2122 .  traMADol (ULTRAM) tablet 50 mg, 50 mg, Oral, Q12H PRN, Rexene Alberts, MD, 50 mg at 11/02/16 1201 .  traZODone (DESYREL) tablet 25 mg, 25 mg, Oral, QHS PRN, Prescott Gum, Collier Salina, MD .  warfarin (COUMADIN) tablet 5 mg, 5 mg, Oral, q1800, Rexene Alberts, MD, 5 mg at 11/02/16 1717 .  Warfarin - Physician Dosing Inpatient, , Does not apply, q1800, Rexene Alberts, MD  Patients Current Diet: DIET DYS 3 Room service appropriate? 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 None of the above  Prior Functional Level Prior Function Level of Independence: Independent with assistive device(s) Comments: Pt used rollator or cane PTA.  Has bad ankles per family  Self Care: Did the patient need help bathing, dressing, using the toilet or eating?  Independent  Indoor Mobility: Did the patient need assistance with walking from room to room (with or without device)? Independent  Stairs: Did the patient need assistance with internal or external stairs (with or without device)? Independent  Functional Cognition: Did the patient need help planning regular tasks such as shopping or remembering to take medications? Independent  Current Functional Level Cognition  Overall Cognitive Status: Impaired/Different from baseline Difficult to assess due to: Tracheostomy (with vent) Current Attention Level: Selective Orientation Level: Oriented X4 Following Commands: Follows multi-step commands consistently Safety/Judgement: Decreased awareness of safety, Decreased awareness of deficits General Comments: demonstrating adherence to sternal precautions    Extremity Assessment (includes Sensation/Coordination)  Upper Extremity Assessment: Generalized weakness  Lower Extremity Assessment: Generalized weakness    ADLs  Overall ADL's : Needs  assistance/impaired Eating/Feeding: Sitting, Minimal assistance Grooming: Minimal assistance, Sitting Upper Body Bathing: Sitting, Moderate assistance Lower Body Bathing: Maximal assistance, Sit to/from stand, +2 for safety/equipment Upper Body Dressing : Sitting, Moderate assistance Lower Body Dressing: Maximal assistance, Sit to/from stand, +2 for safety/equipment Toilet Transfer: Moderate assistance, +2 for physical assistance, +2 for safety/equipment, Ambulation, BSC (+3 for chair follow; with Harmon Pier walker; cues for safety) Toileting- Clothing Manipulation and Hygiene: Total assistance, Sit to/from  stand Functional mobility during ADLs: Moderate assistance (Eva walker) General ADL Comments: Cueing throughout for safety and encouragement. Pt much more motivated after he saw what he could accomplish.     Mobility  Overal bed mobility: Needs Assistance Bed Mobility: Sit to Supine Sit to supine: Max assist, +2 for physical assistance General bed mobility comments: required maxA for LE management into bed, pt with increased fatigue after ambulation    Transfers  Overall transfer level: Needs assistance Equipment used:  (EVA walker) Transfers: Sit to/from Stand Sit to Stand: Mod assist, +2 physical assistance General transfer comment: ModAx2 for power up varying but even with min assist for power up mod cues needed for safety each time to assist with lift.  Assist and cues for steadying and reaching to EVA walker.      Ambulation / Gait / Stairs / Wheelchair Mobility  Ambulation/Gait Ambulation/Gait assistance: +2 physical assistance, Min assist Ambulation Distance (Feet): 200 Feet Assistive device:  (EVA walker) Gait Pattern/deviations: Step-through pattern, Decreased step length - right, Decreased step length - left, Trunk flexed, Antalgic, Narrow base of support, Drifts right/left, Shuffle General Gait Details: mod to min A for steadying with gait with mod cues to sequence steps and eva  walker, vc for encouragement, anterior pelvic tilt, and looking up and out with gait.  Pt also followed with chair and took 1 seated rest break.   Gait velocity: slowed Gait velocity interpretation: Below normal speed for age/gender    Posture / Balance Balance Overall balance assessment: Needs assistance Sitting-balance support: Feet supported, No upper extremity supported Sitting balance-Leahy Scale: Fair Standing balance support: Bilateral upper extremity supported, During functional activity Standing balance-Leahy Scale: Poor Standing balance comment: relies on UE support.     Special needs/care consideration BiPAP/CPAP  N/a CPM  M/a Continuous Drip IV  N/a Dialysis new ESRD requiring Hemodialysis this admission; clipping pending Life Vest  N/a Oxygen  N/a Special Bed  N/a Trach Size  N/a Wound Vac n/a Skin  Right IJ tunneled hemodialysis catheter placed 10/30/15 as well as Right ARM AV fistula; ecchymosis to bilateral area of abdomen as well as BUE; skin tear to back right side; moisture associated skin damage to perineum and sacrum; surgical incisions. Back red rash Bowel mgmt:incontinent LBM 11/02/16. 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 Daughter, Cheri Kearns, is main contact  Discharge Living Setting Plans for Discharge Living Setting: Patient's home, Lives with  (comment) (spouse who has memory issues) Type of Home at Discharge: House Discharge Home Layout: One level Discharge Home Access: [place] entrance Discharge Bathroom Shower/Tub: Walk-in shower Discharge Bathroom Toilet: Standard Discharge Bathroom Accessibility: Yes How Accessible: Accessible via walker Does the patient have any problems obtaining your medications?: No  Social/Family/Support Systems Patient Roles: Spouse, Parent (buisness owner) Contact Information: Cheri Kearns, daughter is main contact Anticipated Caregiver: 5 children Anticipated Caregiver's Contact Information: [phone] Ability/Limitations of Caregiver: children will rotate assistance Caregiver Availability: 24/7 Discharge Plan Discussed with Primary Caregiver:  Yes Is Caregiver In Agreement with Plan?: Yes Does Caregiver/Family have Issues with Lodging/Transportation while Pt is in Rehab?: No  Goals/Additional Needs Patient/Family Goal for Rehab: superviison to min assist with PT, OT, and SLP Expected length of stay: ELOS 18- 22 days Special Service Needs: New ESRD this admission Additional Information: I have told family that wife can stay overnight occasionally with pt if another child stays also and is her responsible party Pt/Family Agrees to Admission and willing to participate: Yes Program Orientation Provided & Reviewed with Pt/Caregiver Including Roles  & Responsibilities: Yes   I spoke with Cheri Kearns, daughter, that wife needs to stay at home  For family report that pt wants her with him all the time at night and she will not get rest and with her memory issues will get her days and nights mixed up.  Decrease burden of Care through IP rehab admission: n/a  Possible need for SNF placement upon discharge:patient originally requested [facility] in [place] to be near his wife at home and to be convenient for the family. I discussed with pt and his son, Heath Lark on 10/30/16 the benefits of CIR admit at the close medical  supervision and coordinated Team efforts.  Patient Condition: This patient's medical and functional status has changed since the consult dated: 10/27/2016 in which the Rehabilitation Physician determined and documented that the patient's condition is appropriate for intensive rehabilitative care in an inpatient rehabilitation facility. See "History of Present Illness" (above) for medical update. Functional changes are: mod assist. Patient's medical and functional status update has been discussed with the Rehabilitation physician and patient remains appropriate for inpatient rehabilitation. Will admit to inpatient rehab today.  Preadmission Screen Completed By:  Cleatrice Burke, 11/03/2016 12:16 PM ______________________________________________________________________   Discussed status with Dr. Posey Pronto on 11/03/2016 at  1216 and received telephone approval for admission today.  Admission Coordinator:  Cleatrice Burke, time 2585 Date 11/03/2016

## 2016-10-30 NOTE — Progress Notes (Signed)
      [address]       [place] [address]             [phone]        CARDIOTHORACIC SURGERY PROGRESS NOTE  R21 Days Post-Op Procedure(s) (LRB): CORONARY ARTERY BYPASS GRAFTING (CABG) x two, using left internal mammary artery and right leg greater saphenous vein harvested endoscopically (N/A) MAZE (N/A) TRANSESOPHAGEAL ECHOCARDIOGRAM (TEE) (N/A) CLIPPING OF ATRIAL APPENDAGE   R2 Days Post-Op Procedure(s) (LRB): ARTERIOVENOUS  FISTULA CREATION right arm (Right) INSERTION OF DIALYSIS CATHETER (N/A)  Subjective: Slowly improving.  Still weak.  No complaints.  Appetite reportedly a little better.  Diarrhea reportedly improving.  Objective: Vital signs: BP Readings from Last 1 Encounters:  10/30/16 (!) 92/54   Pulse Readings from Last 1 Encounters:  10/30/16 92   Resp Readings from Last 1 Encounters:  10/30/16 18   Temp Readings from Last 1 Encounters:  10/30/16 98.3 F (36.8 C) (Oral)    Hemodynamics:    Physical Exam:  Rhythm:   sinus  Breath sounds: clear  Heart sounds:  RRR  Incisions:  Clean and dry  Abdomen:  Soft, non-distended, non-tender  Extremities:  Warm, well-perfused   Intake/Output from previous day: 10/17 0701 - 10/18 0700 In: 1669 [P.O.:480; I.V.:3; NG/GT:936; IV Piggyback:250] Out: 2600 [Stool:600] Intake/Output this shift: Total I/O In: 40 [NG/GT:40] Out: -   Lab Results:  CBC: Recent Labs  10/29/16 0557 10/30/16 0257  WBC 12.9* 10.2  HGB 10.8* 10.2*  HCT 33.7* 32.2*  PLT 230 196    BMET:  Recent Labs  10/29/16 0557 10/30/16 0226  NA 136 135  K 4.0 3.9  CL 100* 100*  CO2 25 27  GLUCOSE 203* 160*  BUN 59* 32*  CREATININE 4.16* 2.99*  CALCIUM 8.0* 7.7*     PT/INR:   Recent Labs  10/30/16 0226  LABPROT 15.1  INR 1.20    CBG (last 3)   Recent Labs  10/29/16 2358 10/30/16 0352 10/30/16 0844  GLUCAP 202* 136* 224*    ABG    Component Value Date/Time   PHART 7.417 10/20/2016 0337     PCO2ART 42.7 10/20/2016 0337   PO2ART 76.0 (L) 10/20/2016 0337   HCO3 27.5 10/20/2016 0337   TCO2 26 10/27/2016 1632   ACIDBASEDEF 1.0 10/17/2016 0007   O2SAT 71.5 10/20/2016 0345    CXR: n/a  Assessment/Plan:  Clinically stable Maintaining NSR w/ stable BP Breathing comfortably on room air Remains in oliguric renal failure Remains very weak and deconditioned, but improving daily Oral intake improving  Watery bowel movements improving CBG's back up somewhat since tube feeds resumed   HD and management of renal failure per Nephrology team  Continue tube feeds for now but hopefully can stop soon  Mobilize  PT  Coumadin  Potentially ready for d/c to CIR service at any time  Rexene Alberts, MD 10/30/2016 9:03 AM

## 2016-10-30 NOTE — Progress Notes (Signed)
CT Surg PM Rounds   had good day- walked in hall Waiting to go to HD nsr

## 2016-10-30 NOTE — Discharge Instructions (Signed)
1. Please obtain vital signs at least one time daily 2.Please weigh the patient daily. If he or she continues to gain weight or develops lower extremity edema, contact the office at [phone]. 3. Ambulate patient at least three times daily and please use sternal precautions.  Please contact Dr. Irven Shelling office on day of discharge from CIR to set up PT/INR draw for 48 hours after discharge.   Coronary Artery Bypass Grafting, Care After This sheet gives you information about how to care for yourself after your procedure. Your health care provider may also give you more specific instructions. If you have problems or questions, contact your health care provider. What can I expect after the procedure? After the procedure, it is common to have:  Nausea and a lack of appetite.  Constipation.  Weakness and fatigue.  Depression or irritability.  Pain or discomfort in your incision areas.  Follow these instructions at home: Medicines  Take over-the-counter and prescription medicines only as told by your health care provider. Do not stop taking medicines or start any new medicines without approval from your health care provider.  If you were prescribed an antibiotic medicine, take it as told by your health care provider. Do not stop taking the antibiotic even if you start to feel better.  Do not drive or use heavy machinery while taking prescription pain medicine. Incision care  Follow instructions from your health care provider about how to take care of your incisions. Make sure you: ? Wash your hands with soap and water before you change your bandage (dressing). If soap and water are not available, use hand sanitizer. ? Change your dressing as told by your health care provider. ? Leave stitches (sutures), skin glue, or adhesive strips in place. These skin closures may need to stay in place for 2 weeks or longer. If adhesive strip edges start to loosen and curl up, you may trim the  loose edges. Do not remove adhesive strips completely unless your health care provider tells you to do that.  Keep incision areas clean, dry, and protected.  Check your incision areas every day for signs of infection. Check for: ? More redness, swelling, or pain. ? More fluid or blood. ? Warmth. ? Pus or a bad smell.  If incisions were made in your legs: ? Avoid crossing your legs. ? Avoid sitting for long periods of time. Change positions every 30 minutes. ? Raise (elevate) your legs when you are sitting. Bathing  Do not take baths, swim, or use a hot tub until your health care provider approves.  Only take sponge baths. Pat the incisions dry. Do not rub incisions with a washcloth or towel.  Ask your health care provider when you can shower. Eating and drinking  Eat foods that are high in fiber, such as raw fruits and vegetables, whole grains, beans, and nuts. Meats should be lean cut. Avoid canned, processed, and fried foods. This can help prevent constipation and is a recommended part of a heart-healthy diet.  Drink enough fluid to keep your urine clear or pale yellow.  Limit alcohol intake to no more than 1 drink a day for nonpregnant women and 2 drinks a day for men. One drink equals 12 oz of beer, 5 oz of wine, or 1 oz of hard liquor. Activity  Rest and limit your activity as told by your health care provider. You may be instructed to: ? Stop any activity right away if you have chest pain, shortness of breath,  irregular heartbeats, or dizziness. Get help right away if you have any of these symptoms. ? Move around frequently for short periods or take short walks as directed by your health care provider. Gradually increase your activities. You may need physical therapy or cardiac rehabilitation to help strengthen your muscles and build your endurance. ? Avoid lifting, pushing, or pulling anything that is heavier than 10 lb (4.5 kg) for at least 6 weeks or as told by your health  care provider.  Do not drive until your health care provider approves.  Ask your health care provider when you may return to work.  Ask your health care provider when you may resume sexual activity. General instructions  Do not use any products that contain nicotine or tobacco, such as cigarettes and e-cigarettes. If you need help quitting, ask your health care provider.  Take 2-3 deep breaths every few hours during the day, while you recover. This helps expand your lungs and prevent complications like pneumonia after surgery.  If you were given a device called an incentive spirometer, use it several times a day to practice deep breathing. Support your chest with a pillow or your arms when you take deep breaths or cough.  Wear compression stockings as told by your health care provider. These stockings help to prevent blood clots and reduce swelling in your legs.  Weigh yourself every day. This helps identify if your body is holding (retaining) fluid that may make your heart and lungs work harder.  Keep all follow-up visits as told by your health care provider. This is important. Contact a health care provider if:  You have more redness, swelling, or pain around any incision.  You have more fluid or blood coming from any incision.  Any incision feels warm to the touch.  You have pus or a bad smell coming from any incision  You have a fever.  You have swelling in your ankles or legs.  You have pain in your legs.  You gain 2 lb (0.9 kg) or more a day.  You are nauseous or you vomit.  You have diarrhea. Get help right away if:  You have chest pain that spreads to your jaw or arms.  You are short of breath.  You have a fast or irregular heartbeat.  You notice a "clicking" in your breastbone (sternum) when you move.  You have numbness or weakness in your arms or legs.  You feel dizzy or light-headed. Summary  After the procedure, it is common to have pain or  discomfort in the incision areas.  Do not take baths, swim, or use a hot tub until your health care provider approves.  Gradually increase your activities. You may need physical therapy or cardiac rehabilitation to help strengthen your muscles and build your endurance.  Weigh yourself every day. This helps identify if your body is holding (retaining) fluid that may make your heart and lungs work harder. This information is not intended to replace advice given to you by your health care provider. Make sure you discuss any questions you have with your health care provider. Document Released: 07/19/2004 Document Revised: 11/19/2015 Document Reviewed: 11/19/2015 Elsevier Interactive Patient Education  2018 [employer].   Endoscopic Saphenous Vein Harvesting, Care After Refer to this sheet in the next few weeks. These instructions provide you with information about caring for yourself after your procedure. Your health care provider may also give you more specific instructions. Your treatment has been planned according to current medical practices,  but problems sometimes occur. Call your health care provider if you have any problems or questions after your procedure. What can I expect after the procedure? After the procedure, it is common to have:  Pain.  Bruising.  Swelling.  Numbness.  Follow these instructions at home: Medicine  Take over-the-counter and prescription medicines only as told by your health care provider.  Do not drive or operate heavy machinery while taking prescription pain medicine. Incision care   Follow instructions from your health care provider about how to take care of the cut made during surgery (incision). Make sure you: ? Wash your hands with soap and water before you change your bandage (dressing). If soap and water are not available, use hand sanitizer. ? Change your dressing as told by your health care provider. ? Leave stitches (sutures), skin glue, or  adhesive strips in place. These skin closures may need to be in place for 2 weeks or longer. If adhesive strip edges start to loosen and curl up, you may trim the loose edges. Do not remove adhesive strips completely unless your health care provider tells you to do that.  Check your incision area every day for signs of infection. Check for: ? More redness, swelling, or pain. ? More fluid or blood. ? Warmth. ? Pus or a bad smell. General instructions  Raise (elevate) your legs above the level of your heart while you are sitting or lying down.  Do any exercises your health care providers have given you. These may include deep breathing, coughing, and walking exercises.  Do not shower, take baths, swim, or use a hot tub unless told by your health care provider.  Wear your elastic stocking if told by your health care provider.  Keep all follow-up visits as told by your health care provider. This is important. Contact a health care provider if:  Medicine does not help your pain.  Your pain gets worse.  You have new leg bruises or your leg bruises get bigger.  You have a fever.  Your leg feels numb.  You have more redness, swelling, or pain around your incision.  You have more fluid or blood coming from your incision.  Your incision feels warm to the touch.  You have pus or a bad smell coming from your incision. Get help right away if:  Your pain is severe.  You develop pain, tenderness, warmth, redness, or swelling in any part of your leg.  You have chest pain.  You have trouble breathing. This information is not intended to replace advice given to you by your health care provider. Make sure you discuss any questions you have with your health care provider. Document Released: 09/11/2010 Document Revised: 06/07/2015 Document Reviewed: 11/13/2014 Elsevier Interactive Patient Education  2018 [employer].

## 2016-10-30 NOTE — Progress Notes (Signed)
Patient ID: [name], male   DOB: Oct 07, 1930, 83 y.o.   MRN: [id number] S:feeling a little better today O:BP (!) 92/54   Pulse 92   Temp 98.3 F (36.8 C) (Oral)   Resp 18   Ht 5' 8"  (1.727 m)   Wt 90.2 kg (198 lb 14.4 oz)   SpO2 95%   BMI 30.24 kg/m   Intake/Output Summary (Last 24 hours) at 10/30/16 0912 Last data filed at 10/30/16 0900  Gross per 24 hour  Intake             1632 ml  Output             2600 ml  Net             -968 ml   Intake/Output: I/O last 3 completed shifts: In: 2219 [P.O.:480; I.V.:3; Other:15; NG/GT:1471; IV Piggyback:250] Out: 2855 [Other:2000; Stool:855]  Intake/Output this shift:  Total I/O In: 43 [I.V.:3; NG/GT:40] Out: -  Weight change: 2.22 kg (4 lb 14.3 oz) Gen: NAD CVS: no rub Resp:cta WCB:JSEGBT Ext:no edema, RUE AVF +T/B   Recent Labs Lab 10/23/16 1552  10/24/16 1600  10/25/16 1509 10/26/16 0348 10/26/16 1621 10/27/16 0400 10/27/16 1624 10/27/16 1632 10/28/16 0526 10/29/16 0557 10/30/16 0226  NA 139  < > 142  --  137  --  137 135 136 137 137 136 135  K 3.4*  < > 3.0*  --  3.1*  --  3.1* 3.2* 3.4* 3.5 3.8 4.0 3.9  CL 103  < > 108  --  105  --  96* 100* 99* 96* 100* 100* 100*  CO2 23  < > 23  --  23  --   --  25 29  --  27 25 27   GLUCOSE 147*  < > 130*  --  186*  --  60* 131* 70 66 78 203* 160*  BUN 57*  < > 83*  --  99*  --  56* 72* 20 20 37* 59* 32*  CREATININE 2.36*  < > 3.47*  --  4.01*  --  3.30* 3.87* 1.81* 1.60* 2.96* 4.16* 2.99*  ALBUMIN 2.2*  --  2.0*  --  2.0*  --   --   --  2.2*  --  2.2*  --   --   CALCIUM 8.1*  < > 8.0*  --  7.7*  --   --  7.8* 7.6*  --  8.1* 8.0* 7.7*  PHOS 2.1*  < > 2.2*  < > 2.8 2.4*  --  3.1 1.7*  --  4.0 4.8* 3.4  AST  --   --   --   --   --   --   --   --   --   --  21  --   --   ALT  --   --   --   --   --   --   --   --   --   --  41  --   --   < > = values in this interval not displayed. Liver Function Tests:  Recent Labs Lab 10/25/16 1509 10/27/16 1624 10/28/16 0526  AST  --    --  21  ALT  --   --  41  ALKPHOS  --   --  144*  BILITOT  --   --  0.6  PROT  --   --  5.5*  ALBUMIN 2.0* 2.2* 2.2*  No results for input(s): LIPASE, AMYLASE in the last 168 hours. No results for input(s): AMMONIA in the last 168 hours. CBC:  Recent Labs Lab 10/25/16 1527  10/27/16 0400  10/28/16 0526 10/29/16 0557 10/30/16 0257  WBC 12.8*  --  10.5  --  12.5* 12.9* 10.2  HGB 7.9*  < > 7.3*  < > 10.3* 10.8* 10.2*  HCT 25.0*  < > 22.5*  < > 31.6* 33.7* 32.2*  MCV 94.0  --  93.4  --  91.3 92.8 92.5  PLT 175  --  193  --  215 230 196  < > = values in this interval not displayed. Cardiac Enzymes: No results for input(s): CKTOTAL, CKMB, CKMBINDEX, TROPONINI in the last 168 hours. CBG:  Recent Labs Lab 10/29/16 1915 10/29/16 2023 10/29/16 2358 10/30/16 0352 10/30/16 0844  GLUCAP 107* 168* 202* 136* 224*    Iron Studies: No results for input(s): IRON, TIBC, TRANSFERRIN, FERRITIN in the last 72 hours. Studies/Results: Dg Chest Port 1 View  Result Date: 10/29/2016 CLINICAL DATA:  Shortness of breath, CHF EXAM: PORTABLE CHEST 1 VIEW COMPARISON:  10/28/2016 FINDINGS: Prior CABG. Right dialysis catheter and feeding tube remain in place, unchanged. Cardiomegaly with bilateral interstitial and alveolar opacities, slightly worsening since prior study, likely worsening CHF. No visible significant effusions. IMPRESSION: Increasing interstitial and alveolar opacities diffusely, likely worsening edema/ CHF. Electronically Signed   By: Rolm Baptise M.D.   On: 10/29/2016 08:08   Dg Chest Port 1v Same Day  Result Date: 10/28/2016 CLINICAL DATA:  Dialysis catheter placement EXAM: PORTABLE CHEST 1 VIEW COMPARISON:  10/27/2016 and priors FINDINGS: Stable cardiomegaly with aortic atherosclerosis. Atrial appendage clip is again seen with post CABG change.New right IJ dialysis catheter is noted with tip at the cavoatrial junction. Gastric tube extends below the left hemidiaphragm into the  expected location of the stomach. Interval removal of right IJ and left subclavian catheters. No pneumothorax. Chronic interstitial prominence is seen bilaterally. No overt pulmonary edema, effusion or pulmonary consolidations. IMPRESSION: 1. Stable cardiomegaly with post CABG change and aortic atherosclerosis. 2. New right IJ approach dialysis catheter tip is seen at the cavoatrial junction without evidence of pneumothorax. 3. Chronic appearing subpleural areas of interstitial prominence some which may represent areas of fibrosis and/or scarring. Electronically Signed   By: Ashley Royalty M.D.   On: 10/28/2016 17:47   Dg Swallowing Func-speech Pathology  Result Date: 10/29/2016 Objective Swallowing Evaluation: Type of Study: MBS-Modified Barium Swallow Study Patient Details Name: [name] MRN: [id number] Date of Birth: June 30, 1930 Today's Date: 10/29/2016 Time: SLP Start Time (ACUTE ONLY): 1008-SLP Stop Time (ACUTE ONLY): 1030 SLP Time Calculation (min) (ACUTE ONLY): 22 min Past Medical History: Past Medical History: Diagnosis Date . Anemia  . Asthma  . Atrial fibrillation ([place]) 12/08/2013 . CHF (congestive heart failure) ([place])  . Chronic joint pain   "from RA" (10/01/2016) . CKD (chronic kidney disease), stage III ([place])  . Constipation   takes stool softener daily . COPD (chronic obstructive pulmonary disease) ([place])  . Coronary artery disease  . Coronary artery disease involving native coronary artery of native heart with angina pectoris ([place]) 09/30/2016 . Dysrhythmia   a-fib . GERD (gastroesophageal reflux disease)   takes Zantac daily  . History of blood transfusion   "related to one of my ORs" . Hyperlipidemia   takes Pravastatin daily . Hypertension   takes Cardizem,Proscar,and Lisinopril daily . Hypothyroidism  . Impaired hearing   wears hearing aids . Joint swelling   "  from RA" (10/01/2016) . Peripheral neuropathy  . Pneumonia 2017 . Rheumatoid arthritis ([place])   "all over; take orencia  q 4 wks" (10/01/2016)  . S/P CABG x 2 10/09/2016  LIMA to LAD, SVG to OM, EVH via right thigh . S/P Maze operation for atrial fibrillation 10/09/2016  Complete bilateral atrial lesion set using bipolar radiofrequency and cryothermy ablation with clipping of LA appendage . Type II diabetes mellitus ([place])  Past Surgical History: Past Surgical History: Procedure Laterality Date . AMPUTATION TOE Left 11/08/2015  Procedure: LEFT HALLUX AMPUTATION AT THE METATARSOPHALANGEAL JOINT;  Surgeon: Wylene Simmer, MD;  Location: [facility];  Service: Orthopedics;  Laterality: Left; . ANKLE FUSION Left 2014 . BIOPSY THYROID  2014 . CARDIAC CATHETERIZATION  09/30/2016 . CARPAL TUNNEL RELEASE Bilateral 1990's . CATARACT EXTRACTION W/ INTRAOCULAR LENS  IMPLANT, BILATERAL Bilateral 1990's . CLIPPING OF ATRIAL APPENDAGE  10/09/2016  Procedure: CLIPPING OF ATRIAL APPENDAGE;  Surgeon: Rexene Alberts, MD;  Location: [facility];  Service: Open Heart Surgery;; . COLONOSCOPY   . CORONARY ARTERY BYPASS GRAFT N/A 10/09/2016  Procedure: CORONARY ARTERY BYPASS GRAFTING (CABG) x two, using left internal mammary artery and right leg greater saphenous vein harvested endoscopically;  Surgeon: Rexene Alberts, MD;  Location: [facility];  Service: Open Heart Surgery;  Laterality: N/A; . FOREIGN BODY REMOVAL Right 09/06/2013  Procedure: REMOVAL FOREIGN BODY RIGHT INDEX FINGER;  Surgeon: Daryll Brod, MD;  Location: [facility];  Service: Orthopedics;  Laterality: Right;  ANESTHESIA: IV REGIONAL FAB . JOINT REPLACEMENT   . MAZE N/A 10/09/2016  Procedure: MAZE;  Surgeon: Rexene Alberts, MD;  Location: [facility];  Service: Open Heart Surgery;  Laterality: N/A; . REPAIR TENDONS FOOT Left 1946; 1984  "farming accident; repaired tendons both times" . RIGHT/LEFT HEART CATH AND CORONARY ANGIOGRAPHY N/A 09/30/2016  Procedure: RIGHT/LEFT HEART CATH AND CORONARY ANGIOGRAPHY;  Surgeon: Nigel Mormon, MD;  Location: [facility];  Service: Cardiovascular;   Laterality: N/A; . SHOULDER ARTHROSCOPY W/ ROTATOR CUFF REPAIR Right 1990's . TEE WITHOUT CARDIOVERSION N/A 10/09/2016  Procedure: TRANSESOPHAGEAL ECHOCARDIOGRAM (TEE);  Surgeon: Rexene Alberts, MD;  Location: [facility];  Service: Open Heart Surgery;  Laterality: N/A; . THYROIDECTOMY Right 02/01/2013  Procedure: RIGHT THYROIDECTOMY LOBECTOMY;  Surgeon: Earnstine Regal, MD;  Location: [facility];  Service: General;  Laterality: Right; . TONSILLECTOMY  1930's . TOTAL KNEE ARTHROPLASTY Right 12/16/2010  Procedure: TOTAL KNEE ARTHROPLASTY; rt Surgeon: Rudean Haskell, MD;  Location: [facility];  Service: Orthopedics;  Laterality: Right; . TOTAL SHOULDER REPLACEMENT Left 03/2010 HPI: Patient is an 83 year old male with history of hypertension, COPD, GERD, pna, chronic diastolic congestive heart failure, stage IV chronic kidney disease, hyperlipidemia, recurrent paroxysmal atrial fibrillation, bronchiectasis, asthma, rheumatoid arthritis, type 2 diabetes mellitus, and anemia. Underwent CABG 9/27. Intubated 9/27-9/28, 10/3-10/8. CXR RIGHT pigtail catheter no longer seen. No pneumothorax. Slight improvement in pulmonary edema. MBS for hopeful upgrade for liquids and solids. No Data Recorded Assessment / Plan / Recommendation CHL IP CLINICAL IMPRESSIONS 10/29/2016 Clinical Impression Swallow function has mild-moderately improved from FEES 10/11. Incomplete inversion of epiglottis and laryngeal closure/protection resulted in silent penetration with thin and to vocal Wallace with nectar. Compensatory strategies for chin tuck and supraglottic breath hold were not successful for increased laryngeal protection. He required cueing for smaller sips. Mild vallecular residue given reduced epiglottic inversion and min pyriform sinus inconsistently. Presence of [place] feeding tube may have contributed and prevented full movement of epiglottis. Oral impairments  included lingual residue post swallow and inconsistent premature spill. SLP questions  premorbid dysphagia with pt compensating as SLP had predicted more improvements. Family denied outward signs of difficulty at home  Esophageal scan did not reveal overt abnormalities however MBS does not diagnose below the level of the UES. Recommend texture upgrade to Dys 3 (mechanical soft) and initiation of liquids thickened to honey consistency, volitional cough frequently, two swallows, no straws and pills whole in puree.  SLP Visit Diagnosis Dysphagia, oropharyngeal phase (R13.12) Attention and concentration deficit following -- Frontal lobe and executive function deficit following -- Impact on safety and function Moderate aspiration risk   CHL IP TREATMENT RECOMMENDATION 10/29/2016 Treatment Recommendations Therapy as outlined in treatment plan below   Prognosis 10/29/2016 Prognosis for Safe Diet Advancement (No Data) Barriers to Reach Goals -- Barriers/Prognosis Comment -- CHL IP DIET RECOMMENDATION 10/29/2016 SLP Diet Recommendations Dysphagia 3 (Mech soft) solids;Honey thick liquids Liquid Administration via Cup;No straw Medication Administration Whole meds with puree Compensations Slow rate;Small sips/bites;Multiple dry swallows after each bite/sip;Hard cough after swallow Postural Changes Remain semi-upright after after feeds/meals (Comment);Seated upright at 90 degrees   CHL IP OTHER RECOMMENDATIONS 10/29/2016 Recommended Consults -- Oral Care Recommendations Oral care BID Other Recommendations --   CHL IP FOLLOW UP RECOMMENDATIONS 10/29/2016 Follow up Recommendations Skilled Nursing facility;Inpatient Rehab   CHL IP FREQUENCY AND DURATION 10/29/2016 Speech Therapy Frequency (ACUTE ONLY) min 2x/week Treatment Duration 2 weeks      CHL IP ORAL PHASE 10/29/2016 Oral Phase Impaired Oral - Pudding Teaspoon -- Oral - Pudding Cup -- Oral - Honey Teaspoon NT Oral - Honey Cup WFL Oral - Nectar Teaspoon NT Oral - Nectar Cup Lingual/palatal residue Oral - Nectar Straw -- Oral - Thin Teaspoon -- Oral - Thin Cup  Premature spillage Oral - Thin Straw -- Oral - Puree WFL Oral - Mech Soft -- Oral - Regular WFL Oral - Multi-Consistency -- Oral - Pill -- Oral Phase - Comment --  CHL IP PHARYNGEAL PHASE 10/29/2016 Pharyngeal Phase Impaired Pharyngeal- Pudding Teaspoon -- Pharyngeal -- Pharyngeal- Pudding Cup -- Pharyngeal -- Pharyngeal- Honey Teaspoon NT Pharyngeal -- Pharyngeal- Honey Cup Pharyngeal residue - valleculae;Reduced epiglottic inversion Pharyngeal -- Pharyngeal- Nectar Teaspoon NT Pharyngeal -- Pharyngeal- Nectar Cup Pharyngeal residue - valleculae;Reduced epiglottic inversion;Penetration/Aspiration during swallow Pharyngeal Material enters airway, CONTACTS Wallace and not ejected out Pharyngeal- Nectar Straw -- Pharyngeal -- Pharyngeal- Thin Teaspoon -- Pharyngeal -- Pharyngeal- Thin Cup Penetration/Aspiration during swallow;Pharyngeal residue - valleculae;Reduced epiglottic inversion Pharyngeal -- Pharyngeal- Thin Straw -- Pharyngeal -- Pharyngeal- Puree Pharyngeal residue - valleculae;Pharyngeal residue - pyriform;Reduced laryngeal elevation;Reduced epiglottic inversion Pharyngeal -- Pharyngeal- Mechanical Soft -- Pharyngeal -- Pharyngeal- Regular -- Pharyngeal -- Pharyngeal- Multi-consistency -- Pharyngeal -- Pharyngeal- Pill -- Pharyngeal -- Pharyngeal Comment --  CHL IP CERVICAL ESOPHAGEAL PHASE 10/29/2016 Cervical Esophageal Phase WFL Pudding Teaspoon -- Pudding Cup -- Honey Teaspoon -- Honey Cup -- Nectar Teaspoon -- Nectar Cup -- Nectar Straw -- Thin Teaspoon -- Thin Cup -- Thin Straw -- Puree -- Mechanical Soft -- Regular -- Multi-consistency -- Pill -- Cervical Esophageal Comment -- No flowsheet data found. [name] 10/29/2016, 11:18 AM [name] Colvin Caroli.Ed CCC-SLP Pager [phone]              Dg Fluoro Guide Cv Line-no Report  Result Date: 10/28/2016 Fluoroscopy was utilized by the requesting physician.  No radiographic interpretation.   [name] amiodarone  200 mg Oral Q12H  . aspirin  81 mg  Per Tube Daily  .  atorvastatin  40 mg Oral q1800  . chlorhexidine gluconate (MEDLINE KIT)  15 mL Mouth Rinse BID  . Chlorhexidine Gluconate Cloth  6 each Topical Q0600  . enoxaparin (LOVENOX) injection  30 mg Subcutaneous Q48H  . feeding supplement (PRO-STAT SUGAR FREE 64)  30 mL Per Tube TID  . ferrous sulfate  325 mg Oral Q breakfast  . finasteride  5 mg Oral QHS  . folic acid  1 mg Oral Daily  . gabapentin  300 mg Oral BID  . Gerhardt's butt cream   Topical BID  . insulin aspart  0-24 Units Subcutaneous Q4H  . insulin detemir  28 Units Subcutaneous BID  . levothyroxine  25 mcg Per Tube QAC breakfast  . mouth rinse  15 mL Mouth Rinse q12n4p  . metoprolol tartrate  12.5 mg Oral BID  . moving right along book   Does not apply Once  . moving right along book   Does not apply Once  . multivitamin  15 mL Per Tube Daily  . pantoprazole sodium  40 mg Oral Daily  . patient's guide to using coumadin book   Does not apply Once  . sodium chloride flush  3 mL Intravenous Q12H  . sodium chloride flush  3 mL Intravenous Q12H  . tamsulosin  0.4 mg Oral QHS  . warfarin  2.5 mg Oral q1800  . Warfarin - Physician Dosing Inpatient   Does not apply q1800    BMET    Component Value Date/Time   NA 135 10/30/2016 0226   K 3.9 10/30/2016 0226   CL 100 (L) 10/30/2016 0226   CO2 27 10/30/2016 0226   GLUCOSE 160 (H) 10/30/2016 0226   BUN 32 (H) 10/30/2016 0226   CREATININE 2.99 (H) 10/30/2016 0226   CALCIUM 7.7 (L) 10/30/2016 0226   GFRNONAA 18 (L) 10/30/2016 0226   GFRAA 21 (L) 10/30/2016 0226   CBC    Component Value Date/Time   WBC 10.2 10/30/2016 0257   RBC 3.48 (L) 10/30/2016 0257   HGB 10.2 (L) 10/30/2016 0257   HCT 32.2 (L) 10/30/2016 0257   PLT 196 10/30/2016 0257   MCV 92.5 10/30/2016 0257   MCH 29.3 10/30/2016 0257   MCHC 31.7 10/30/2016 0257   RDW 17.3 (H) 10/30/2016 0257   LYMPHSABS 1.1 10/21/2016 0303   MONOABS 1.3 (H) 10/21/2016 0303   EOSABS 0.4 10/21/2016 0303    BASOSABS 0.0 10/21/2016 0303    Assessment/Plan:  1. AKI/CKD stage 3-4- has been oliguric and requiring CVVHD/IHD. CVVHD initiated 10/15/16 and stopped 10/25/16 and tolerated IHD well on 10/27/16. 1. Tolerated IHD well and continue with MWF schedule for now and follow for possible recovery of renal function. 2. CAD s/p CABG x 2 (LIMA, and right SVG) and MAZE procedure 10/09/16. Doing better and off of pressors 3. Anemia of CKD with ABLA- on ESA and transfuse prn 1. feraheme 10/15/16 and again 10/22/16 4. Vascular access- s/p TDC and LUE right BC AVF placed on 10/28/16.  Appreciate Dr. Nicole Cella assistance 5. Severe debilitation- continue with PT/OT and will likely require CIR/SNF once stable for discharge.  Refused PT on 10/28/16 but encouraged to work with them again today. 6. DM- per primary 7. Hypokalemia- use 4K bath with HD. 8. Insomnia- now on trazadone qhs and not prn 9. Itching- phos stable,  SARNA lotion prn.  Donetta Potts, MD [employer] [phone]

## 2016-10-30 NOTE — Progress Notes (Signed)
Pt having multiple loose stools back to back this evening. Sacrum, coccyx, and perineum red, inflamed with moisture associated skin break down throughout area. Flexiseal replaced for skin protection. Skin thoroughly cleaned and dried. Upon flexiseal placement,return of loose stool immediately began. Will continue to monitor and assess site.

## 2016-10-30 NOTE — Progress Notes (Signed)
Physical Therapy Treatment Patient Details Name: [name] MRN: [id number] DOB: [birth year] Today's Date: 10/30/2016    History of Present Illness Pt admit for CABGx2 and MAZE procedure on 9/27.  He developed worsening renal failure post operatively requiring CRRT, Intubation 9/27-10/8.   On 10/3 he developed shock in the setting of a spontaneous R sided pneumothorax.  PMHx: hypertension, chronic diastolic congestive heart failure, stage IV chronic kidney disease, hyperlipidemia, recurrent PAF on long-term anticoagulation, bronchiectasis with chronic dyspnea on exertion, COPD, asthma, RA, DM, and anemia     PT Comments    Pt admitted with above diagnosis. Pt currently with functional limitations due to balance and endurance deficits listed below (see PT Problem List). Pt was able to ambulate with [place]er and incr distance today.  More participatory.   Pt will benefit from skilled PT to increase their independence and safety with mobility to allow discharge to the venue listed below.     Follow Up Recommendations  Supervision/Assistance - 24 hour;CIR     Equipment Recommendations  Other (comment) (TBA)    Recommendations for Other Services       Precautions / Restrictions Precautions Precautions: Fall;Sternal Restrictions Weight Bearing Restrictions: Yes (Sternal Precautions) RUE Weight Bearing: Weight bearing as tolerated    Mobility  Bed Mobility                  Transfers Overall transfer level: Needs assistance Equipment used:  (EVA walker) Transfers: Sit to/from Stand Sit to Stand: Mod assist;+2 physical assistance         General transfer comment: ModAx2 for power up varying but even with min assist for power up mod cues needed for safety each time to assist with lift.  Assist and cues for steadying and reaching to EVA walker.  Nursing did clean pts bottom last stand as he had some stool leakage. Demonstrated some decr safety with sit to stand and stand to  sit with need for cues for sternal precautions.   Ambulation/Gait Ambulation/Gait assistance: +2 physical assistance;Min assist Ambulation Distance (Feet): 120 Feet (55 feet and then 65 feet) Assistive device:  (EVA walker) Gait Pattern/deviations: Step-through pattern;Decreased step length - right;Decreased step length - left;Trunk flexed;Antalgic;Narrow base of support;Drifts right/left;Shuffle Gait velocity: slowed Gait velocity interpretation: Below normal speed for age/gender General Gait Details: mod to min A for steadying with gait with mod cues to sequence steps and eva walker, vc for encouragement, anterior pelvic tilt, and looking up and out with gait.  Pt also followed with chair and took 1 seated rest break.     Stairs            Wheelchair Mobility    Modified Rankin (Stroke Patients Only)       Balance Overall balance assessment: Needs assistance Sitting-balance support: Feet supported;No upper extremity supported Sitting balance-Leahy Scale: Fair     Standing balance support: Bilateral upper extremity supported;During functional activity Standing balance-Leahy Scale: Poor Standing balance comment: relies on UE support.                             Cognition Arousal/Alertness: Awake/alert Behavior During Therapy: Flat affect Overall Cognitive Status: Impaired/Different from baseline Area of Impairment: Orientation;Attention;Memory;Following commands                 Orientation Level: Disoriented to;Time Current Attention Level: Selective Memory: Decreased recall of precautions;Decreased short-term memory Following Commands: Follows multi-step commands consistently Safety/Judgement: Decreased awareness of  safety;Decreased awareness of deficits   Problem Solving: Slow processing;Requires verbal cues;Requires tactile cues;Decreased initiation;Difficulty sequencing General Comments: demonstrating adherence to sternal precautions       Exercises General Exercises - Lower Extremity Ankle Circles/Pumps: AROM;Both;10 reps;Supine Long Arc Quad: Both;Seated;5 reps;AROM Hip Flexion/Marching: Both;Seated;AROM;5 reps    General Comments General comments (skin integrity, edema, etc.): VSS      Pertinent Vitals/Pain Pain Assessment: No/denies pain    Home Living                      Prior Function            PT Goals (current goals can now be found in the care plan section) Acute Rehab PT Goals Patient Stated Goal: to get better Progress towards PT goals: Progressing toward goals    Frequency    Min 3X/week      PT Plan Current plan remains appropriate    Co-evaluation              AM-PAC PT "6 Clicks" Daily Activity  Outcome Measure  Difficulty turning over in bed (including adjusting bedclothes, sheets and blankets)?: Unable Difficulty moving from lying on back to sitting on the side of the bed? : A Lot Difficulty sitting down on and standing up from a chair with arms (e.g., wheelchair, bedside commode, etc,.)?: A Lot Help needed moving to and from a bed to chair (including a wheelchair)?: A Lot Help needed walking in hospital room?: A Lot Help needed climbing 3-5 steps with a railing? : Total 6 Click Score: 10    End of Session Equipment Utilized During Treatment: Gait belt Activity Tolerance: Patient limited by fatigue Patient left: in chair;with call bell/phone within reach;with family/visitor present;with chair alarm set Nurse Communication: Mobility status;Precautions PT Visit Diagnosis: Muscle weakness (generalized) (M62.81);Other abnormalities of gait and mobility (R26.89) Pain - part of body:  (chest)     Time: 5427-0623 PT Time Calculation (min) (ACUTE ONLY): 20 min  Charges:  $Gait Training: 8-22 mins                    G Codes:       [place] [phone] (pager)    Denice Paradise 10/30/2016, 2:07 PM

## 2016-10-30 NOTE — Progress Notes (Signed)
  Speech Language Pathology Treatment: Dysphagia  Patient Details Name: [name] MRN: [id number] DOB: [birth year] Today's Date: 10/30/2016 Time: 0973-5329 SLP Time Calculation (min) (ACUTE ONLY): 17 min  Assessment / Plan / Recommendation Clinical Impression  Pt sitting in chair and appears to be improving re: strength. He independently recalled strategies: 2 swallows and needed moderate verbal cues to implement consistently and for smaller sips. Intermittent wet vocal quality noted. No reflexive cough noted; delayed throat clear. Pt will need to continue to follow strategies consistently. Plans to initiate respiratory muscle strength training.     HPI HPI: Patient is an 83 year old male with history of hypertension, COPD, GERD, pna, chronic diastolic congestive heart failure, stage IV chronic kidney disease, hyperlipidemia, recurrent paroxysmal atrial fibrillation, bronchiectasis, asthma, rheumatoid arthritis, type 2 diabetes mellitus, and anemia. Underwent CABG 9/27. Intubated 9/27-9/28, 10/3-10/8. CXR RIGHT pigtail catheter no longer seen. No pneumothorax. Slight improvement in pulmonary edema. MBS for hopeful upgrade for liquids and solids.      SLP Plan  Continue with current plan of care       Recommendations  Diet recommendations: Dysphagia 3 (mechanical soft);Honey-thick liquid Liquids provided via: Cup;No straw Medication Administration: Crushed with puree Supervision: Patient able to self feed;Full supervision/cueing for compensatory strategies Compensations: Slow rate;Small sips/bites;Hard cough after swallow;Multiple dry swallows after each bite/sip Postural Changes and/or Swallow Maneuvers: Seated upright 90 degrees                Oral Care Recommendations: Oral care BID Follow up Recommendations: Inpatient Rehab SLP Visit Diagnosis: Dysphagia, oropharyngeal phase (R13.12) Plan: Continue with current plan of care       GO                [name] 10/30/2016, 10:30 AM   [name] Colvin Caroli.Ed [employer] [phone]

## 2016-10-31 ENCOUNTER — Inpatient Hospital Stay (HOSPITAL_COMMUNITY): Payer: Medicare Other

## 2016-10-31 LAB — PHOSPHORUS: PHOSPHORUS: 4.1 mg/dL (ref 2.5–4.6)

## 2016-10-31 LAB — PROTIME-INR
INR: 1.19
PROTHROMBIN TIME: 15 s (ref 11.4–15.2)

## 2016-10-31 LAB — BASIC METABOLIC PANEL
Anion gap: 12 (ref 5–15)
BUN: 58 mg/dL — ABNORMAL HIGH (ref 6–20)
CO2: 23 mmol/L (ref 22–32)
Calcium: 8.1 mg/dL — ABNORMAL LOW (ref 8.9–10.3)
Chloride: 100 mmol/L — ABNORMAL LOW (ref 101–111)
Creatinine, Ser: 4.45 mg/dL — ABNORMAL HIGH (ref 0.61–1.24)
GFR calc Af Amer: 13 mL/min — ABNORMAL LOW (ref >60)
GFR calc non Af Amer: 11 mL/min — ABNORMAL LOW (ref >60)
Glucose, Bld: 185 mg/dL — ABNORMAL HIGH (ref 65–99)
Potassium: 3.5 mmol/L (ref 3.5–5.1)
Sodium: 135 mmol/L (ref 135–145)

## 2016-10-31 LAB — GLUCOSE, CAPILLARY
GLUCOSE-CAPILLARY: 147 mg/dL — AB (ref 65–99)
GLUCOSE-CAPILLARY: 211 mg/dL — AB (ref 65–99)
Glucose-Capillary: 130 mg/dL — ABNORMAL HIGH (ref 65–99)
Glucose-Capillary: 177 mg/dL — ABNORMAL HIGH (ref 65–99)
Glucose-Capillary: 236 mg/dL — ABNORMAL HIGH (ref 65–99)
Glucose-Capillary: 244 mg/dL — ABNORMAL HIGH (ref 65–99)

## 2016-10-31 LAB — CBC
HCT: 28 % — ABNORMAL LOW (ref 39.0–52.0)
Hemoglobin: 8.9 g/dL — ABNORMAL LOW (ref 13.0–17.0)
MCH: 29.4 pg (ref 26.0–34.0)
MCHC: 31.8 g/dL (ref 30.0–36.0)
MCV: 92.4 fL (ref 78.0–100.0)
Platelets: 203 10*3/uL (ref 150–400)
RBC: 3.03 MIL/uL — ABNORMAL LOW (ref 4.22–5.81)
RDW: 16.7 % — ABNORMAL HIGH (ref 11.5–15.5)
WBC: 10.4 10*3/uL (ref 4.0–10.5)

## 2016-10-31 LAB — TYPE AND SCREEN
ABO/RH(D): A POS
Antibody Screen: NEGATIVE

## 2016-10-31 MED ORDER — AMIODARONE HCL IN DEXTROSE 360-4.14 MG/200ML-% IV SOLN
30.0000 mg/h | INTRAVENOUS | Status: DC
  Administered 2016-10-31 (×2): 30 mg/h via INTRAVENOUS

## 2016-10-31 MED ORDER — CALCIUM CHLORIDE 10 % IV SOLN
INTRAVENOUS | Status: AC
  Administered 2016-10-31: 1000 mg
  Filled 2016-10-31: qty 10

## 2016-10-31 MED ORDER — AMIODARONE HCL IN DEXTROSE 360-4.14 MG/200ML-% IV SOLN: 30.0000 mg/h | INTRAVENOUS | Status: DC

## 2016-10-31 MED ORDER — ALBUMIN HUMAN 5 % IV SOLN
12.5000 g | Freq: Once | INTRAVENOUS | Status: AC
  Administered 2016-10-31: 12.5 g via INTRAVENOUS

## 2016-10-31 MED ORDER — PANTOPRAZOLE SODIUM 40 MG PO TBEC
40.0000 mg | DELAYED_RELEASE_TABLET | Freq: Every day | ORAL | Status: DC
  Administered 2016-10-31 – 2016-11-03 (×4): 40 mg via ORAL
  Filled 2016-10-31 (×4): qty 1

## 2016-10-31 MED ORDER — WARFARIN SODIUM 5 MG PO TABS
5.0000 mg | ORAL_TABLET | Freq: Every day | ORAL | Status: DC
  Administered 2016-10-31 – 2016-11-02 (×3): 5 mg via ORAL
  Filled 2016-10-31 (×3): qty 1

## 2016-10-31 MED ORDER — INSULIN ASPART 100 UNIT/ML ~~LOC~~ SOLN
0.0000 [IU] | Freq: Three times a day (TID) | SUBCUTANEOUS | Status: DC
  Administered 2016-10-31 (×2): 5 [IU] via SUBCUTANEOUS
  Administered 2016-11-01 – 2016-11-02 (×2): 3 [IU] via SUBCUTANEOUS
  Administered 2016-11-03 (×2): 2 [IU] via SUBCUTANEOUS

## 2016-10-31 MED ORDER — TRAZODONE HCL 50 MG PO TABS: 25.0000 mg | ORAL_TABLET | Freq: Every evening | ORAL | Status: DC | PRN

## 2016-10-31 MED ORDER — GLUCERNA SHAKE PO LIQD
237.0000 mL | Freq: Two times a day (BID) | ORAL | Status: DC
  Administered 2016-10-31 – 2016-11-02 (×4): 237 mL via ORAL

## 2016-10-31 MED ORDER — AMIODARONE HCL 200 MG PO TABS
400.0000 mg | ORAL_TABLET | Freq: Two times a day (BID) | ORAL | Status: DC
  Administered 2016-10-31 – 2016-11-03 (×7): 400 mg via ORAL
  Filled 2016-10-31 (×6): qty 2

## 2016-10-31 MED ORDER — INSULIN DETEMIR 100 UNIT/ML ~~LOC~~ SOLN
28.0000 [IU] | Freq: Every day | SUBCUTANEOUS | Status: DC
  Administered 2016-10-31 – 2016-11-03 (×4): 28 [IU] via SUBCUTANEOUS
  Filled 2016-10-31 (×5): qty 0.28

## 2016-10-31 MED ORDER — ALBUMIN HUMAN 5 % IV SOLN
INTRAVENOUS | Status: AC
  Administered 2016-10-31: 12.5 g via INTRAVENOUS
  Filled 2016-10-31: qty 250

## 2016-10-31 MED ORDER — RENA-VITE PO TABS
1.0000 | ORAL_TABLET | Freq: Every day | ORAL | Status: DC
  Administered 2016-10-31 – 2016-11-02 (×3): 1 via ORAL
  Filled 2016-10-31 (×3): qty 1

## 2016-10-31 MED ORDER — CALCIUM GLUCONATE 10 % IV SOLN
0.5000 g | Freq: Once | INTRAVENOUS | Status: AC
  Filled 2016-10-31: qty 5

## 2016-10-31 NOTE — Progress Notes (Signed)
Patient ID: [name], male   DOB: February 21, 1930, 83 y.o.   MRN: [id number] S: BP a little low this morning and overnight O:BP (!) 109/51   Pulse 88   Temp 97.6 F (36.4 C) (Oral)   Resp 19   Ht 5\' 8"  (1.727 m)   Wt 92.3 kg (203 lb 7.8 oz)   SpO2 100%   BMI 30.94 kg/m   Intake/Output Summary (Last 24 hours) at 10/31/16 0902 Last data filed at 10/31/16 0600  Gross per 24 hour  Intake          1004.99 ml  Output                0 ml  Net          1004.99 ml   Intake/Output: I/O last 3 completed shifts: In: 2541 [P.O.:600; I.V.:171; NG/GT:1520; IV Piggyback:250] Out: 700 [Stool:700]  Intake/Output this shift:  No intake/output data recorded. Weight change: -2 kg (-4 lb 6.6 oz) Gen: NAD CVS: no rub Resp:cta OEU:MPNTIR Ext: no edema, RUE AVF +T/B   Recent Labs Lab 10/24/16 1600  10/25/16 1509 10/26/16 0348  10/27/16 0400 10/27/16 1624 10/27/16 1632 10/28/16 0526 10/29/16 0557 10/30/16 0226 10/31/16 0039  NA 142  --  137  --   < > 135 136 137 137 136 135 135  K 3.0*  --  3.1*  --   < > 3.2* 3.4* 3.5 3.8 4.0 3.9 3.5  CL 108  --  105  --   < > 100* 99* 96* 100* 100* 100* 100*  CO2 23  --  23  --   --  25 29  --  27 25 27 23   GLUCOSE 130*  --  186*  --   < > 131* 70 66 78 203* 160* 185*  BUN 83*  --  99*  --   < > 72* 20 20 37* 59* 32* 58*  CREATININE 3.47*  --  4.01*  --   < > 3.87* 1.81* 1.60* 2.96* 4.16* 2.99* 4.45*  ALBUMIN 2.0*  --  2.0*  --   --   --  2.2*  --  2.2*  --   --   --   CALCIUM 8.0*  --  7.7*  --   --  7.8* 7.6*  --  8.1* 8.0* 7.7* 8.1*  PHOS 2.2*  < > 2.8 2.4*  --  3.1 1.7*  --  4.0 4.8* 3.4 4.1  AST  --   --   --   --   --   --   --   --  21  --   --   --   ALT  --   --   --   --   --   --   --   --  41  --   --   --   < > = values in this interval not displayed. Liver Function Tests:  Recent Labs Lab 10/25/16 1509 10/27/16 1624 10/28/16 0526  AST  --   --  21  ALT  --   --  41  ALKPHOS  --   --  144*  BILITOT  --   --  0.6  PROT  --   --   5.5*  ALBUMIN 2.0* 2.2* 2.2*   No results for input(s): LIPASE, AMYLASE in the last 168 hours. No results for input(s): AMMONIA in the last 168 hours. CBC:  Recent Labs Lab 10/27/16 0400  10/28/16 0526 10/29/16 0557 10/30/16 0257 10/31/16 0245  WBC 10.5  --  12.5* 12.9* 10.2 10.4  HGB 7.3*  < > 10.3* 10.8* 10.2* 8.9*  HCT 22.5*  < > 31.6* 33.7* 32.2* 28.0*  MCV 93.4  --  91.3 92.8 92.5 92.4  PLT 193  --  215 230 196 203  < > = values in this interval not displayed. Cardiac Enzymes: No results for input(s): CKTOTAL, CKMB, CKMBINDEX, TROPONINI in the last 168 hours. CBG:  Recent Labs Lab 10/30/16 1616 10/30/16 1951 10/31/16 0003 10/31/16 0415 10/31/16 0749  GLUCAP 161* 141* 177* 147* 130*    Iron Studies: No results for input(s): IRON, TIBC, TRANSFERRIN, FERRITIN in the last 72 hours. Studies/Results: Dg Swallowing Func-speech Pathology  Result Date: 10/29/2016 Objective Swallowing Evaluation: Type of Study: MBS-Modified Barium Swallow Study Patient Details Name: [name] MRN: [id number] Date of Birth: 1930/03/20 Today's Date: 10/29/2016 Time: SLP Start Time (ACUTE ONLY): 1008-SLP Stop Time (ACUTE ONLY): 1030 SLP Time Calculation (min) (ACUTE ONLY): 22 min Past Medical History: Past Medical History: Diagnosis Date . Anemia  . Asthma  . Atrial fibrillation ([place]) 12/08/2013 . CHF (congestive heart failure) ([place])  . Chronic joint pain   "from RA" (10/01/2016) . CKD (chronic kidney disease), stage III ([place])  . Constipation   takes stool softener daily . COPD (chronic obstructive pulmonary disease) ([place])  . Coronary artery disease  . Coronary artery disease involving native coronary artery of native heart with angina pectoris ([place]) 09/30/2016 . Dysrhythmia   a-fib . GERD (gastroesophageal reflux disease)   takes Zantac daily  . History of blood transfusion   "related to one of my ORs" . Hyperlipidemia   takes Pravastatin daily . Hypertension   takes Cardizem,Proscar,and Lisinopril  daily . Hypothyroidism  . Impaired hearing   wears hearing aids . Joint swelling   "from RA" (10/01/2016) . Peripheral neuropathy  . Pneumonia 2017 . Rheumatoid arthritis ([place])   "all over; take orencia  q 4 wks" (10/01/2016) . S/P CABG x 2 10/09/2016  LIMA to LAD, SVG to OM, EVH via right thigh . S/P Maze operation for atrial fibrillation 10/09/2016  Complete bilateral atrial lesion set using bipolar radiofrequency and cryothermy ablation with clipping of LA appendage . Type II diabetes mellitus ([place])  Past Surgical History: Past Surgical History: Procedure Laterality Date . AMPUTATION TOE Left 11/08/2015  Procedure: LEFT HALLUX AMPUTATION AT THE METATARSOPHALANGEAL JOINT;  Surgeon: Wylene Simmer, MD;  Location: [place];  Service: Orthopedics;  Laterality: Left; . ANKLE FUSION Left 2014 . BIOPSY THYROID  2014 . CARDIAC CATHETERIZATION  09/30/2016 . CARPAL TUNNEL RELEASE Bilateral 1990's . CATARACT EXTRACTION W/ INTRAOCULAR LENS  IMPLANT, BILATERAL Bilateral 1990's . CLIPPING OF ATRIAL APPENDAGE  10/09/2016  Procedure: CLIPPING OF ATRIAL APPENDAGE;  Surgeon: Rexene Alberts, MD;  Location: [facility];  Service: Open Heart Surgery;; . COLONOSCOPY   . CORONARY ARTERY BYPASS GRAFT N/A 10/09/2016  Procedure: CORONARY ARTERY BYPASS GRAFTING (CABG) x two, using left internal mammary artery and right leg greater saphenous vein harvested endoscopically;  Surgeon: Rexene Alberts, MD;  Location: [facility];  Service: Open Heart Surgery;  Laterality: N/A; . FOREIGN BODY REMOVAL Right 09/06/2013  Procedure: REMOVAL FOREIGN BODY RIGHT INDEX FINGER;  Surgeon: Daryll Brod, MD;  Location: [facility];  Service: Orthopedics;  Laterality: Right;  ANESTHESIA: IV REGIONAL FAB . JOINT REPLACEMENT   . MAZE N/A 10/09/2016  Procedure: MAZE;  Surgeon: Rexene Alberts, MD;  Location:  [facility];  Service: Open Heart Surgery;  Laterality: N/A; . REPAIR TENDONS FOOT Left 1946; 1984  "farming accident; repaired tendons both times" .  RIGHT/LEFT HEART CATH AND CORONARY ANGIOGRAPHY N/A 09/30/2016  Procedure: RIGHT/LEFT HEART CATH AND CORONARY ANGIOGRAPHY;  Surgeon: Nigel Mormon, MD;  Location: [facility];  Service: Cardiovascular;  Laterality: N/A; . SHOULDER ARTHROSCOPY W/ ROTATOR CUFF REPAIR Right 1990's . TEE WITHOUT CARDIOVERSION N/A 10/09/2016  Procedure: TRANSESOPHAGEAL ECHOCARDIOGRAM (TEE);  Surgeon: Rexene Alberts, MD;  Location: [facility];  Service: Open Heart Surgery;  Laterality: N/A; . THYROIDECTOMY Right 02/01/2013  Procedure: RIGHT THYROIDECTOMY LOBECTOMY;  Surgeon: Earnstine Regal, MD;  Location: [facility];  Service: General;  Laterality: Right; . TONSILLECTOMY  1930's . TOTAL KNEE ARTHROPLASTY Right 12/16/2010  Procedure: TOTAL KNEE ARTHROPLASTY; rt Surgeon: Rudean Haskell, MD;  Location: [facility];  Service: Orthopedics;  Laterality: Right; . TOTAL SHOULDER REPLACEMENT Left 03/2010 HPI: Patient is an 83 year old male with history of hypertension, COPD, GERD, pna, chronic diastolic congestive heart failure, stage IV chronic kidney disease, hyperlipidemia, recurrent paroxysmal atrial fibrillation, bronchiectasis, asthma, rheumatoid arthritis, type 2 diabetes mellitus, and anemia. Underwent CABG 9/27. Intubated 9/27-9/28, 10/3-10/8. CXR RIGHT pigtail catheter no longer seen. No pneumothorax. Slight improvement in pulmonary edema. MBS for hopeful upgrade for liquids and solids. No Data Recorded Assessment / Plan / Recommendation CHL IP CLINICAL IMPRESSIONS 10/29/2016 Clinical Impression Swallow function has mild-moderately improved from FEES 10/11. Incomplete inversion of epiglottis and laryngeal closure/protection resulted in silent penetration with thin and to vocal Wallace with nectar. Compensatory strategies for chin tuck and supraglottic breath hold were not successful for increased laryngeal protection. He required cueing for smaller sips. Mild vallecular residue given reduced epiglottic inversion and min pyriform sinus  inconsistently. Presence of [place]rack feeding tube may have contributed and prevented full movement of epiglottis. Oral impairments included lingual residue post swallow and inconsistent premature spill. SLP questions premorbid dysphagia with pt compensating as SLP had predicted more improvements. Family denied outward signs of difficulty at home  Esophageal scan did not reveal overt abnormalities however MBS does not diagnose below the level of the UES. Recommend texture upgrade to Dys 3 (mechanical soft) and initiation of liquids thickened to honey consistency, volitional cough frequently, two swallows, no straws and pills whole in puree.  SLP Visit Diagnosis Dysphagia, oropharyngeal phase (R13.12) Attention and concentration deficit following -- Frontal lobe and executive function deficit following -- Impact on safety and function Moderate aspiration risk   CHL IP TREATMENT RECOMMENDATION 10/29/2016 Treatment Recommendations Therapy as outlined in treatment plan below   Prognosis 10/29/2016 Prognosis for Safe Diet Advancement (No Data) Barriers to Reach Goals -- Barriers/Prognosis Comment -- CHL IP DIET RECOMMENDATION 10/29/2016 SLP Diet Recommendations Dysphagia 3 (Mech soft) solids;Honey thick liquids Liquid Administration via Cup;No straw Medication Administration Whole meds with puree Compensations Slow rate;Small sips/bites;Multiple dry swallows after each bite/sip;Hard cough after swallow Postural Changes Remain semi-upright after after feeds/meals (Comment);Seated upright at 90 degrees   CHL IP OTHER RECOMMENDATIONS 10/29/2016 Recommended Consults -- Oral Care Recommendations Oral care BID Other Recommendations --   CHL IP FOLLOW UP RECOMMENDATIONS 10/29/2016 Follow up Recommendations Skilled Nursing facility;Inpatient Rehab   CHL IP FREQUENCY AND DURATION 10/29/2016 Speech Therapy Frequency (ACUTE ONLY) min 2x/week Treatment Duration 2 weeks      CHL IP ORAL PHASE 10/29/2016 Oral Phase Impaired Oral -  Pudding Teaspoon -- Oral - Pudding Cup -- Oral - Honey Teaspoon NT Oral - Honey Cup [facility] Oral -  Nectar Teaspoon NT Oral - Nectar Cup Lingual/palatal residue Oral - Nectar Straw -- Oral - Thin Teaspoon -- Oral - Thin Cup Premature spillage Oral - Thin Straw -- Oral - Puree WFL Oral - Mech Soft -- Oral - Regular WFL Oral - Multi-Consistency -- Oral - Pill -- Oral Phase - Comment --  CHL IP PHARYNGEAL PHASE 10/29/2016 Pharyngeal Phase Impaired Pharyngeal- Pudding Teaspoon -- Pharyngeal -- Pharyngeal- Pudding Cup -- Pharyngeal -- Pharyngeal- Honey Teaspoon NT Pharyngeal -- Pharyngeal- Honey Cup Pharyngeal residue - valleculae;Reduced epiglottic inversion Pharyngeal -- Pharyngeal- Nectar Teaspoon NT Pharyngeal -- Pharyngeal- Nectar Cup Pharyngeal residue - valleculae;Reduced epiglottic inversion;Penetration/Aspiration during swallow Pharyngeal Material enters airway, CONTACTS Wallace and not ejected out Pharyngeal- Nectar Straw -- Pharyngeal -- Pharyngeal- Thin Teaspoon -- Pharyngeal -- Pharyngeal- Thin Cup Penetration/Aspiration during swallow;Pharyngeal residue - valleculae;Reduced epiglottic inversion Pharyngeal -- Pharyngeal- Thin Straw -- Pharyngeal -- Pharyngeal- Puree Pharyngeal residue - valleculae;Pharyngeal residue - pyriform;Reduced laryngeal elevation;Reduced epiglottic inversion Pharyngeal -- Pharyngeal- Mechanical Soft -- Pharyngeal -- Pharyngeal- Regular -- Pharyngeal -- Pharyngeal- Multi-consistency -- Pharyngeal -- Pharyngeal- Pill -- Pharyngeal -- Pharyngeal Comment --  CHL IP CERVICAL ESOPHAGEAL PHASE 10/29/2016 Cervical Esophageal Phase WFL Pudding Teaspoon -- Pudding Cup -- Honey Teaspoon -- Honey Cup -- Nectar Teaspoon -- Nectar Cup -- Nectar Straw -- Thin Teaspoon -- Thin Cup -- Thin Straw -- Puree -- Mechanical Soft -- Regular -- Multi-consistency -- Pill -- Cervical Esophageal Comment -- No flowsheet data found. [name] 10/29/2016, 11:18 AM [name] Colvin Caroli.Ed CCC-SLP Pager  [phone]              . amiodarone  200 mg Oral Q12H  . aspirin  81 mg Per Tube Daily  . atorvastatin  40 mg Oral q1800  . Chlorhexidine Gluconate Cloth  6 each Topical Q0600  . enoxaparin (LOVENOX) injection  30 mg Subcutaneous Q48H  . feeding supplement (PRO-STAT SUGAR FREE 64)  30 mL Per Tube TID  . ferrous sulfate  325 mg Oral Q breakfast  . finasteride  5 mg Oral QHS  . folic acid  1 mg Oral Daily  . gabapentin  300 mg Oral BID  . Gerhardt's butt cream   Topical BID  . insulin aspart  0-24 Units Subcutaneous Q4H  . insulin detemir  28 Units Subcutaneous BID  . levothyroxine  25 mcg Per Tube QAC breakfast  . mouth rinse  15 mL Mouth Rinse BID  . metoprolol tartrate  12.5 mg Oral BID  . moving right along book   Does not apply Once  . multivitamin  15 mL Per Tube Daily  . pantoprazole sodium  40 mg Oral Daily  . patient's guide to using coumadin book   Does not apply Once  . sodium chloride flush  3 mL Intravenous Q12H  . tamsulosin  0.4 mg Oral QHS  . warfarin  2.5 mg Oral q1800  . Warfarin - Physician Dosing Inpatient   Does not apply q1800    BMET    Component Value Date/Time   NA 135 10/31/2016 0039   K 3.5 10/31/2016 0039   CL 100 (L) 10/31/2016 0039   CO2 23 10/31/2016 0039   GLUCOSE 185 (H) 10/31/2016 0039   BUN 58 (H) 10/31/2016 0039   CREATININE 4.45 (H) 10/31/2016 0039   CALCIUM 8.1 (L) 10/31/2016 0039   GFRNONAA 11 (L) 10/31/2016 0039   GFRAA 13 (L) 10/31/2016 0039   CBC    Component Value Date/Time   WBC 10.4  10/31/2016 0245   RBC 3.03 (L) 10/31/2016 0245   HGB 8.9 (L) 10/31/2016 0245   HCT 28.0 (L) 10/31/2016 0245   PLT 203 10/31/2016 0245   MCV 92.4 10/31/2016 0245   MCH 29.4 10/31/2016 0245   MCHC 31.8 10/31/2016 0245   RDW 16.7 (H) 10/31/2016 0245   LYMPHSABS 1.1 10/21/2016 0303   MONOABS 1.3 (H) 10/21/2016 0303   EOSABS 0.4 10/21/2016 0303   BASOSABS 0.0 10/21/2016 0303     Assessment/Plan:  1. AKI/CKD stage 3-4- has been oliguric  and requiring CVVHD/IHD. CVVHD initiated 10/15/16 and stopped 10/25/16 and tolerated IHD well on 10/27/16. 1. Tolerated IHD well but now with low BP and likely volume depleted from diarrhea.  Will hold off on HD today and plan for HD tomorrow.   2. Continue with IHD for now and follow for possible recovery of renal function. 2. CAD s/p CABG x 2 (LIMA, and right SVG) and MAZE procedure 10/09/16. Doing better and off of pressors 3. Anemia of CKD with ABLA- on ESA and transfuse prn 1. feraheme 10/15/16 and again 10/22/16 4. Vascular access- s/p TDC and LUE right BC AVF placed on 10/28/16. Appreciate Dr. Nicole Cella assistance 5. Severe debilitation- continue with PT/OT and will likely require CIR/SNF once stable for discharge. Refused PT on 10/28/16 but encouraged to work with them again today. 6. DM- per primary 7. Hypokalemia- use 4K bath with HD. 8. Insomnia- now ontrazadone qhs and not prn 9. Itching- phos stable, SARNA lotion prn.  Donetta Potts, MD [employer] [phone]

## 2016-10-31 NOTE — Progress Notes (Signed)
Physical Therapy Treatment Patient Details Name: [name] MRN: [id number] DOB: 06-30-1930 Today's Date: 10/31/2016    History of Present Illness Pt admit for CABGx2 and MAZE procedure on 9/27.  He developed worsening renal failure post operatively requiring CRRT, Intubation 9/27-10/8.   On 10/3 he developed shock in the setting of a spontaneous R sided pneumothorax.  PMHx: hypertension, chronic diastolic congestive heart failure, stage IV chronic kidney disease, hyperlipidemia, recurrent PAF on long-term anticoagulation, bronchiectasis with chronic dyspnea on exertion, COPD, asthma, RA, DM, and anemia     PT Comments    Pt making steady progress towards his goals. Pt continues to be limited by decreased strength and increased fatigue. Pt currently max A for bed mobility, modAx2 for transfers and minAx2 for ambulation of 190 feet with close chair follow and 1x seated rest break. Pt requires skilled PT to progress mobility and improve strength and endurance to safely navigate their discharge environment.    Follow Up Recommendations  Supervision/Assistance - 24 hour;CIR     Equipment Recommendations  Other (comment) (TBA)    Recommendations for Other Services       Precautions / Restrictions Precautions Precautions: Fall;Sternal Restrictions Weight Bearing Restrictions: Yes (Sternal Precautions) RUE Weight Bearing: Weight bearing as tolerated    Mobility  Bed Mobility Overal bed mobility: Needs Assistance Bed Mobility: Sit to Supine       Sit to supine: Max assist;+2 for physical assistance   General bed mobility comments: required maxA for LE management into bed, pt with increased fatigue after ambulation  Transfers Overall transfer level: Needs assistance Equipment used:  (EVA walker) Transfers: Sit to/from Stand Sit to Stand: Mod assist;+2 physical assistance         General transfer comment: ModAx2 for power up varying but even with min assist for power up mod  cues needed for safety each time to assist with lift.  Assist and cues for steadying and reaching to EVA walker.    Ambulation/Gait Ambulation/Gait assistance: +2 physical assistance;Min assist Ambulation Distance (Feet): 200 Feet Assistive device:  (EVA walker) Gait Pattern/deviations: Step-through pattern;Decreased step length - right;Decreased step length - left;Trunk flexed;Antalgic;Narrow base of support;Drifts right/left;Shuffle Gait velocity: slowed   General Gait Details: mod to min A for steadying with gait with mod cues to sequence steps and eva walker, vc for encouragement, anterior pelvic tilt, and looking up and out with gait.  Pt also followed with chair and took 1 seated rest break.         Balance Overall balance assessment: Needs assistance Sitting-balance support: Feet supported;No upper extremity supported Sitting balance-Leahy Scale: Fair     Standing balance support: Bilateral upper extremity supported;During functional activity Standing balance-Leahy Scale: Poor Standing balance comment: relies on UE support.                             Cognition Arousal/Alertness: Awake/alert Behavior During Therapy: Flat affect Overall Cognitive Status: Impaired/Different from baseline Area of Impairment: Orientation;Attention;Memory;Following commands                   Current Attention Level: Selective Memory: Decreased recall of precautions;Decreased short-term memory Following Commands: Follows multi-step commands consistently Safety/Judgement: Decreased awareness of safety;Decreased awareness of deficits   Problem Solving: Slow processing;Requires verbal cues;Requires tactile cues;Decreased initiation;Difficulty sequencing General Comments: demonstrating adherence to sternal precautions         General Comments  VSS throughout session  Pertinent Vitals/Pain Pain Assessment: Faces Faces Pain Scale: Hurts even more Pain Location:  sternum Pain Descriptors / Indicators: Discomfort Pain Intervention(s): Limited activity within patient's tolerance;Monitored during session;Repositioned           PT Goals (current goals can now be found in the care plan section) Acute Rehab PT Goals Patient Stated Goal: to get better PT Goal Formulation: With patient Time For Goal Achievement: 11/04/16 Potential to Achieve Goals: Fair Progress towards PT goals: Progressing toward goals    Frequency    Min 3X/week      PT Plan Current plan remains appropriate       AM-PAC PT "6 Clicks" Daily Activity  Outcome Measure  Difficulty turning over in bed (including adjusting bedclothes, sheets and blankets)?: Unable Difficulty moving from lying on back to sitting on the side of the bed? : Unable Difficulty sitting down on and standing up from a chair with arms (e.g., wheelchair, bedside commode, etc,.)?: Unable Help needed moving to and from a bed to chair (including a wheelchair)?: A Lot Help needed walking in hospital room?: A Lot Help needed climbing 3-5 steps with a railing? : Total 6 Click Score: 8    End of Session Equipment Utilized During Treatment: Gait belt Activity Tolerance: Patient limited by fatigue Patient left: with call bell/phone within reach;with family/visitor present;with chair alarm set;in bed Nurse Communication: Mobility status;Precautions PT Visit Diagnosis: Muscle weakness (generalized) (M62.81);Other abnormalities of gait and mobility (R26.89) Pain - part of body:  (chest)     Time: 7616-0737 PT Time Calculation (min) (ACUTE ONLY): 38 min  Charges:  $Gait Training: 8-22 mins                    G Codes:       Lea Baine B. Migdalia Dk PT, DPT Acute Rehabilitation  [phone] Pager [phone]     [place] 10/31/2016, 3:05 PM

## 2016-10-31 NOTE — Progress Notes (Signed)
Patient ID: [name], male   DOB: 1930/10/06, 83 y.o.   MRN: [id number] TCTS evening rounds:  Hemodynamically stable in sinus rhythm.  sats 100%  Taking some PO  Ate 75% of lunch and dinner  Worked with PT today.

## 2016-10-31 NOTE — Progress Notes (Signed)
  Speech Language Pathology Treatment: Dysphagia  Patient Details Name: [name] MRN: [id number] DOB: 1930-12-24 Today's Date: 10/31/2016 Time: 3557-3220 SLP Time Calculation (min) (ACUTE ONLY): 20 min  Assessment / Plan / Recommendation Clinical Impression  Pt initiated respiratory muscle strength training with EMT (expiratory muscle trainer) trainer initially set at 10 cm H2O which did not provide enough resistance to 20 with pt reporting effort of 5. No cues needed for coordinating breath. Performed 2 sets of 10. Provided verbal and written education and encouraged pt to perform 3 trials of 10 3 times a day as able.  Observed with part of lunch needing moderate cues to decrease intake with straw (honey comes up slower however cups preferred) grilled cheese and mashed potatoes. No indications of airway intrusion. SLP reentered room and daughter feeding pt; reiterated need for pt to initiate and self feed unless extremely tired; daughter voiced understanding.      HPI HPI: Patient is an 83 year old male with history of hypertension, COPD, GERD, pna, chronic diastolic congestive heart failure, stage IV chronic kidney disease, hyperlipidemia, recurrent paroxysmal atrial fibrillation, bronchiectasis, asthma, rheumatoid arthritis, type 2 diabetes mellitus, and anemia. Underwent CABG 9/27. Intubated 9/27-9/28, 10/3-10/8. CXR RIGHT pigtail catheter no longer seen. No pneumothorax. Slight improvement in pulmonary edema. MBS for hopeful upgrade for liquids and solids.      SLP Plan  Continue with current plan of care       Recommendations  Diet recommendations: Dysphagia 3 (mechanical soft);Honey-thick liquid Liquids provided via: Cup Medication Administration: Whole meds with puree Supervision: Patient able to self feed;Full supervision/cueing for compensatory strategies Compensations: Slow rate;Small sips/bites;Hard cough after swallow;Multiple dry swallows after each bite/sip Postural  Changes and/or Swallow Maneuvers: Seated upright 90 degrees                Oral Care Recommendations: Oral care BID Follow up Recommendations: Inpatient Rehab SLP Visit Diagnosis: Dysphagia, oropharyngeal phase (R13.12) Plan: Continue with current plan of care       GO                [name] 10/31/2016, 3:33 PM [name] M.Ed [employer] [phone]

## 2016-10-31 NOTE — Progress Notes (Addendum)
      [address]       [place] [address]             [phone]        CARDIOTHORACIC SURGERY PROGRESS NOTE  R22Days Post-Op Procedure(s) (LRB): CORONARY ARTERY BYPASS GRAFTING (CABG) x two, using left internal mammary artery and right leg greater saphenous vein harvested endoscopically (N/A) MAZE (N/A) TRANSESOPHAGEAL ECHOCARDIOGRAM (TEE) (N/A) CLIPPING OF ATRIAL APPENDAGE   R3 Days Post-Op Procedure(s) (LRB): ARTERIOVENOUS  FISTULA CREATION right arm (Right) INSERTION OF DIALYSIS CATHETER (N/A)  Subjective: No complaints.  Reportedly eating better.  Flexiseal rectal tube replaced overnight due to diarrhea.  Went back into Afib w/ controlled rate but marginal BP overnight, now back in NSR  Objective: Vital signs: BP Readings from Last 1 Encounters:  10/31/16 (!) 109/51   Pulse Readings from Last 1 Encounters:  10/31/16 88   Resp Readings from Last 1 Encounters:  10/31/16 19   Temp Readings from Last 1 Encounters:  10/31/16 97.6 F (36.4 C) (Oral)    Hemodynamics:    Physical Exam:  Rhythm:   sinus  Breath sounds: clear  Heart sounds:  RRR w/out murmur  Incisions:  Clean and dry  Abdomen:  Soft, non-distended, non-tender  Extremities:  Warm, well-perfused    Intake/Output from previous day: 10/18 0701 - 10/19 0700 In: 5631 [P.O.:240; I.V.:168; NG/GT:1040] Out: 100 [Stool:100] Intake/Output this shift: No intake/output data recorded.  Lab Results:  CBC: Recent Labs  10/30/16 0257 10/31/16 0245  WBC 10.2 10.4  HGB 10.2* 8.9*  HCT 32.2* 28.0*  PLT 196 203    BMET:  Recent Labs  10/30/16 0226 10/31/16 0039  NA 135 135  K 3.9 3.5  CL 100* 100*  CO2 27 23  GLUCOSE 160* 185*  BUN 32* 58*  CREATININE 2.99* 4.45*  CALCIUM 7.7* 8.1*     PT/INR:   Recent Labs  10/31/16 0039  LABPROT 15.0  INR 1.19    CBG (last 3)   Recent Labs  10/31/16 0003 10/31/16 0415 10/31/16 0749  GLUCAP 177* 147* 130*    ABG   Component Value Date/Time   PHART 7.417 10/20/2016 0337   PCO2ART 42.7 10/20/2016 0337   PO2ART 76.0 (L) 10/20/2016 0337   HCO3 27.5 10/20/2016 0337   TCO2 26 10/27/2016 1632   ACIDBASEDEF 1.0 10/17/2016 0007   O2SAT 71.5 10/20/2016 0345    CXR: n/a  Assessment/Plan:  Clinically stable although some Afib overnight Currently back in NSR w/ stable BP Breathing comfortably on room air Remains in oliguric renal failure Looks relatively volume-depleted on exam Remains very weak and deconditioned, but improving daily Oral intake improving  Watery bowel movements persist CBG's improved   Continue amiodarone  HD and management of renal failure per Nephrology team - I favor holding off today  Will stop tube feeds and see how he does on his own  Decrease levemir insulin and change CBG's/SSI to ac/hs  Mobilize  PT  Increase Coumadin  Potentially ready for d/c to CIR service at any time  Rexene Alberts, MD 10/31/2016 9:28 AM

## 2016-10-31 NOTE — Progress Notes (Signed)
Nutrition Follow-up  DOCUMENTATION CODES:   Obesity unspecified  INTERVENTION:  Glucerna BID thickened to proper consistency (honey), each supplement provides 220 kcal and 10 grams of protein  NUTRITION DIAGNOSIS:   Increased nutrient needs related to  (recent surgery) as evidenced by estimated needs.  Ongoing   GOAL:   Patient will meet greater than or equal to 90% of their needs  Progressing   MONITOR:   PO intake, Supplement acceptance, Labs, Weight trends, I & O's, Diet advancement  ASSESSMENT:   Pt with PMH of HTN, CHF, stage IV CKD, hyperlipidemia, afib, COPD, DM admitted for CABG x 2 and MAZE. Post op pt had renal and respiratory failure required intubation and CRRT 10/3.   Discussed pt with RN. 10/29/16 Pt's diet advanced to dysphagia 3 with honey thick liquids (Carb Modified) Per SLP, MBS pending for hopeful diet advancement.  TF was d/c today and Cortrak tube pulled. PO intake increasing and pt tolerating. Pt remains on IHD, plan for HD tomorrow.  Labs reviewed; CBG 130-273, Hemoglobin 8.9 Medications reviewed; ferrous sulfate, folic acid, sliding scale insulin, Levemir, Rena-vit, Protonix, Warfarin  Diet Order:  DIET DYS 3 Room service appropriate? Yes; Fluid consistency: Honey Thick  Skin:   (MASD sacrum)  Last BM:  10/30/16 type 6 in rectal tube  Height:   Ht Readings from Last 1 Encounters:  10/10/16 5\' 8"  (1.727 m)    Weight:   Wt Readings from Last 1 Encounters:  10/31/16 203 lb 7.8 oz (92.3 kg)    Ideal Body Weight:  70 kg  BMI:  Body mass index is 30.94 kg/m.  Estimated Nutritional Needs:   Kcal:  1900-2100  Protein:  120-135 grams  Fluid:  1.2 L/day  EDUCATION NEEDS:   No education needs identified at this time  Parks Ranger, MS, RDN, LDN 10/31/2016 2:48 PM

## 2016-10-31 NOTE — Progress Notes (Addendum)
Pt BP began trending down over the last hour (2330-0030).  PM Lopressor held and no pain meds given this evening.  Trazodone given at bedtime (2nd night given 50mg  opposed to 25mg  previously).    A similar episode occurred earlier this evening when pt converted into Afib. Pt given 12.5g 5% Albumin, 200mg  PO Amiodarone and an Amiodarone infusion at 30 mg/hr for 4 hours (stopped 2330).  SBP held in the 90s until amio gtt stopped per order.  Pt remains in a rate controlled (80-90s) Afib.   On-call MD notified. Verbal orders for 12.5g 5% albumin with 1/2g of calcium gluconate, amiodarone infusion @ 30 mg/hr, STAT CBC, and STAT type and screen.  One unit PRBC to be given if Hgb below parameters.    Pt A&O x 4, calm, resting with no signs of respiratory distress (100% on RA, RR 18). Lungs clear, diminished. No signs of bleeding. Will continue to monitor patient closely.   Mosie Lukes, RN

## 2016-10-31 NOTE — Progress Notes (Addendum)
Noted medical issues overnight. I will follow his progress as we await medical readiness for a possible inpt rehab admission when ready for d/c. I will follow up on Monday. Pt on amio drip this morning. [phone]

## 2016-11-01 LAB — PROTIME-INR
INR: 1.21
Prothrombin Time: 15.2 seconds (ref 11.4–15.2)

## 2016-11-01 LAB — GLUCOSE, CAPILLARY
GLUCOSE-CAPILLARY: 153 mg/dL — AB (ref 65–99)
GLUCOSE-CAPILLARY: 174 mg/dL — AB (ref 65–99)

## 2016-11-01 LAB — CBC
HEMATOCRIT: 28.4 % — AB (ref 39.0–52.0)
Hemoglobin: 9.1 g/dL — ABNORMAL LOW (ref 13.0–17.0)
MCH: 29.3 pg (ref 26.0–34.0)
MCHC: 32 g/dL (ref 30.0–36.0)
MCV: 91.3 fL (ref 78.0–100.0)
Platelets: 223 10*3/uL (ref 150–400)
RBC: 3.11 MIL/uL — ABNORMAL LOW (ref 4.22–5.81)
RDW: 16.6 % — AB (ref 11.5–15.5)
WBC: 9.9 10*3/uL (ref 4.0–10.5)

## 2016-11-01 LAB — RENAL FUNCTION PANEL
Albumin: 2.3 g/dL — ABNORMAL LOW (ref 3.5–5.0)
Anion gap: 15 (ref 5–15)
BUN: 81 mg/dL — AB (ref 6–20)
CHLORIDE: 96 mmol/L — AB (ref 101–111)
CO2: 21 mmol/L — ABNORMAL LOW (ref 22–32)
Calcium: 7.9 mg/dL — ABNORMAL LOW (ref 8.9–10.3)
Creatinine, Ser: 5.85 mg/dL — ABNORMAL HIGH (ref 0.61–1.24)
GFR calc Af Amer: 9 mL/min — ABNORMAL LOW (ref >60)
GFR, EST NON AFRICAN AMERICAN: 8 mL/min — AB (ref >60)
GLUCOSE: 154 mg/dL — AB (ref 65–99)
POTASSIUM: 4.4 mmol/L (ref 3.5–5.1)
Phosphorus: 4.2 mg/dL (ref 2.5–4.6)
Sodium: 132 mmol/L — ABNORMAL LOW (ref 135–145)

## 2016-11-01 LAB — PHOSPHORUS: Phosphorus: 4.1 mg/dL (ref 2.5–4.6)

## 2016-11-01 MED ORDER — ALTEPLASE 2 MG IJ SOLR: 2.0000 mg | Freq: Once | INTRAMUSCULAR | Status: DC | PRN

## 2016-11-01 MED ORDER — PENTAFLUOROPROP-TETRAFLUOROETH EX AERO: 1.0000 "application " | INHALATION_SPRAY | CUTANEOUS | Status: DC | PRN

## 2016-11-01 MED ORDER — SODIUM CHLORIDE 0.9 % IV SOLN: 100.0000 mL | INTRAVENOUS | Status: DC | PRN

## 2016-11-01 MED ORDER — HEPARIN SODIUM (PORCINE) 1000 UNIT/ML DIALYSIS: 20.0000 [IU]/kg | INTRAMUSCULAR | Status: DC | PRN

## 2016-11-01 MED ORDER — HEPARIN SODIUM (PORCINE) 1000 UNIT/ML DIALYSIS: 1000.0000 [IU] | INTRAMUSCULAR | Status: DC | PRN

## 2016-11-01 MED ORDER — TRAMADOL HCL 50 MG PO TABS
ORAL_TABLET | ORAL | Status: AC
  Filled 2016-11-01: qty 1

## 2016-11-01 MED ORDER — LIDOCAINE HCL (PF) 1 % IJ SOLN: 5.0000 mL | INTRAMUSCULAR | Status: DC | PRN

## 2016-11-01 MED ORDER — LIDOCAINE-PRILOCAINE 2.5-2.5 % EX CREA: 1.0000 "application " | TOPICAL_CREAM | CUTANEOUS | Status: DC | PRN

## 2016-11-01 NOTE — Plan of Care (Signed)
Problem: Cardiac: Goal: Hemodynamic stability will improve Outcome: Progressing Within parameters. Awaiting IP rehab Goal: Ability to maintain an adequate cardiac output will improve Outcome: Progressing Within parameters

## 2016-11-01 NOTE — Progress Notes (Signed)
4 Days Post-Op Procedure(s) (LRB): ARTERIOVENOUS  FISTULA CREATION right arm (Right) INSERTION OF DIALYSIS CATHETER (N/A) Subjective:  Complains of cough productive of some sputum. Ambulated 190 ft.  Eating 75% of meals. Had HD today  Objective: Vital signs in last 24 hours: Temp:  [97.9 F (36.6 C)-98.8 F (37.1 C)] 97.9 F (36.6 C) (10/20 1600) Pulse Rate:  [78-97] 80 (10/20 1800) Cardiac Rhythm: Normal sinus rhythm (10/20 1600) Resp:  [13-25] 22 (10/20 1800) BP: (87-109)/(35-92) 96/35 (10/20 1800) SpO2:  [93 %-99 %] 95 % (10/20 1800) Weight:  [91.2 kg (201 lb 1 oz)-91.7 kg (202 lb 2.6 oz)] 91.2 kg (201 lb 1 oz) (10/20 1200)  Hemodynamic parameters for last 24 hours:    Intake/Output from previous day: 10/19 0701 - 10/20 0700 In: 1346.8 [P.O.:1120; I.V.:66.8; NG/GT:160] Out: 0  Intake/Output this shift: Total I/O In: 250 [P.O.:240; I.V.:10] Out: 0   General appearance: alert and cooperative Heart: regular rate and rhythm, S1, S2 normal, no murmur, click, rub or gallop Lungs: rhonchi bilaterally Extremities: extremities normal, atraumatic, no cyanosis or edema Wound: incision ok  Lab Results:  Recent Labs  10/31/16 0245 11/01/16 0307  WBC 10.4 9.9  HGB 8.9* 9.1*  HCT 28.0* 28.4*  PLT 203 223   BMET:  Recent Labs  10/31/16 0039 11/01/16 0307  NA 135 132*  K 3.5 4.4  CL 100* 96*  CO2 23 21*  GLUCOSE 185* 154*  BUN 58* 81*  CREATININE 4.45* 5.85*  CALCIUM 8.1* 7.9*    PT/INR:  Recent Labs  11/01/16 0307  LABPROT 15.2  INR 1.21   ABG    Component Value Date/Time   PHART 7.417 10/20/2016 0337   HCO3 27.5 10/20/2016 0337   TCO2 26 10/27/2016 1632   ACIDBASEDEF 1.0 10/17/2016 0007   O2SAT 71.5 10/20/2016 0345   CBG (last 3)   Recent Labs  10/31/16 1641 10/31/16 2108 11/01/16 1545  GLUCAP 236* 244* 153*    Assessment/Plan:  He is hemodynamically stable in sinus rhythm.  Tolerating HD well. Bladder scans have been showing about  300+ cc of urine in bladder. INR still sub-therapeutic so Coumadin increased to 5 mg daily. Continue to encourage nutrition and mobilization Reevaluation by CIR on Monday.   LOS: 23 days    Gaye Pollack 11/01/2016

## 2016-11-01 NOTE — Procedures (Signed)
I was present at this dialysis session. I have reviewed the session itself and made appropriate changes.   Filed Weights   10/29/16 1844 10/30/16 0600 10/31/16 0500  Weight: 92 kg (202 lb 13.2 oz) 90.2 kg (198 lb 14.4 oz) 92.3 kg (203 lb 7.8 oz)     Recent Labs Lab 11/01/16 0307  NA 132*  K 4.4  CL 96*  CO2 21*  GLUCOSE 154*  BUN 81*  CREATININE 5.85*  CALCIUM 7.9*  PHOS 4.2  4.1     Recent Labs Lab 10/30/16 0257 10/31/16 0245 11/01/16 0307  WBC 10.2 10.4 9.9  HGB 10.2* 8.9* 9.1*  HCT 32.2* 28.0* 28.4*  MCV 92.5 92.4 91.3  PLT 196 203 223    Scheduled Meds: . amiodarone  400 mg Oral Q12H  . aspirin  81 mg Per Tube Daily  . atorvastatin  40 mg Oral q1800  . Chlorhexidine Gluconate Cloth  6 each Topical Q0600  . enoxaparin (LOVENOX) injection  30 mg Subcutaneous Q48H  . feeding supplement (GLUCERNA SHAKE)  237 mL Oral BID BM  . ferrous sulfate  325 mg Oral Q breakfast  . finasteride  5 mg Oral QHS  . folic acid  1 mg Oral Daily  . gabapentin  300 mg Oral BID  . Gerhardt's butt cream   Topical BID  . insulin aspart  0-15 Units Subcutaneous TID WC  . insulin detemir  28 Units Subcutaneous Daily  . levothyroxine  25 mcg Per Tube QAC breakfast  . mouth rinse  15 mL Mouth Rinse BID  . metoprolol tartrate  12.5 mg Oral BID  . moving right along book   Does not apply Once  . multivitamin  1 tablet Oral QHS  . pantoprazole  40 mg Oral Daily  . patient's guide to using coumadin book   Does not apply Once  . sodium chloride flush  3 mL Intravenous Q12H  . tamsulosin  0.4 mg Oral QHS  . traMADol      . warfarin  5 mg Oral q1800  . Warfarin - Physician Dosing Inpatient   Does not apply q1800   Continuous Infusions: . sodium chloride    . sodium chloride     PRN Meds:.sodium chloride, acetaminophen, camphor-menthol, guaiFENesin, ondansetron (ZOFRAN) IV, RESOURCE THICKENUP CLEAR, sodium chloride flush, traMADol, traZODone   Donetta Potts,  MD 11/01/2016,  8:53 AM

## 2016-11-02 LAB — GLUCOSE, CAPILLARY
GLUCOSE-CAPILLARY: 70 mg/dL (ref 65–99)
GLUCOSE-CAPILLARY: 88 mg/dL (ref 65–99)
GLUCOSE-CAPILLARY: 92 mg/dL (ref 65–99)
Glucose-Capillary: 151 mg/dL — ABNORMAL HIGH (ref 65–99)

## 2016-11-02 LAB — PHOSPHORUS: Phosphorus: 3.2 mg/dL (ref 2.5–4.6)

## 2016-11-02 LAB — PROTIME-INR
INR: 1.35
PROTHROMBIN TIME: 16.5 s — AB (ref 11.4–15.2)

## 2016-11-02 NOTE — Progress Notes (Signed)
Patient ID: [name], male   DOB: 20-Jun-1930, 83 y.o.   MRN: [id number] S:Feels better  O:BP (!) 96/48   Pulse 89   Temp 98 F (36.7 C) (Oral)   Resp 15   Ht 5\' 8"  (1.727 m)   Wt 94.8 kg (208 lb 15.9 oz)   SpO2 94%   BMI 31.78 kg/m   Intake/Output Summary (Last 24 hours) at 11/02/16 1131 Last data filed at 11/02/16 1000  Gross per 24 hour  Intake             1200 ml  Output              200 ml  Net             1000 ml   Intake/Output: I/O last 3 completed shifts: In: 1080 [P.O.:1070; I.V.:10] Out: 200 [Stool:200]  Intake/Output this shift:  Total I/O In: 360 [P.O.:360] Out: -  Weight change:  Gen: NAD CVS: no rub Resp:cta AST:MHDQQI Ext: no edema, RUE AVF +T/B   Recent Labs Lab 10/27/16 0400 10/27/16 1624 10/27/16 1632 10/28/16 0526 10/29/16 0557 10/30/16 0226 10/31/16 0039 11/01/16 0307 11/02/16 0426  NA 135 136 137 137 136 135 135 132*  --   K 3.2* 3.4* 3.5 3.8 4.0 3.9 3.5 4.4  --   CL 100* 99* 96* 100* 100* 100* 100* 96*  --   CO2 25 29  --  27 25 27 23  21*  --   GLUCOSE 131* 70 66 78 203* 160* 185* 154*  --   BUN 72* 20 20 37* 59* 32* 58* 81*  --   CREATININE 3.87* 1.81* 1.60* 2.96* 4.16* 2.99* 4.45* 5.85*  --   ALBUMIN  --  2.2*  --  2.2*  --   --   --  2.3*  --   CALCIUM 7.8* 7.6*  --  8.1* 8.0* 7.7* 8.1* 7.9*  --   PHOS 3.1 1.7*  --  4.0 4.8* 3.4 4.1 4.2  4.1 3.2  AST  --   --   --  21  --   --   --   --   --   ALT  --   --   --  41  --   --   --   --   --    Liver Function Tests:  Recent Labs Lab 10/27/16 1624 10/28/16 0526 11/01/16 0307  AST  --  21  --   ALT  --  41  --   ALKPHOS  --  144*  --   BILITOT  --  0.6  --   PROT  --  5.5*  --   ALBUMIN 2.2* 2.2* 2.3*   No results for input(s): LIPASE, AMYLASE in the last 168 hours. No results for input(s): AMMONIA in the last 168 hours. CBC:  Recent Labs Lab 10/28/16 0526 10/29/16 0557 10/30/16 0257 10/31/16 0245 11/01/16 0307  WBC 12.5* 12.9* 10.2 10.4 9.9  HGB 10.3* 10.8*  10.2* 8.9* 9.1*  HCT 31.6* 33.7* 32.2* 28.0* 28.4*  MCV 91.3 92.8 92.5 92.4 91.3  PLT 215 230 196 203 223   Cardiac Enzymes: No results for input(s): CKTOTAL, CKMB, CKMBINDEX, TROPONINI in the last 168 hours. CBG:  Recent Labs Lab 10/31/16 1641 10/31/16 2108 11/01/16 1545 11/01/16 2214 11/02/16 0730  GLUCAP 236* 244* 153* 174* 70    Iron Studies: No results for input(s): IRON, TIBC, TRANSFERRIN, FERRITIN in the last 72 hours. Studies/Results: No results found. [name]  amiodarone  400 mg Oral Q12H  . aspirin  81 mg Per Tube Daily  . atorvastatin  40 mg Oral q1800  . Chlorhexidine Gluconate Cloth  6 each Topical Q0600  . enoxaparin (LOVENOX) injection  30 mg Subcutaneous Q48H  . feeding supplement (GLUCERNA SHAKE)  237 mL Oral BID BM  . ferrous sulfate  325 mg Oral Q breakfast  . finasteride  5 mg Oral QHS  . folic acid  1 mg Oral Daily  . gabapentin  300 mg Oral BID  . Gerhardt's butt cream   Topical BID  . insulin aspart  0-15 Units Subcutaneous TID WC  . insulin detemir  28 Units Subcutaneous Daily  . levothyroxine  25 mcg Per Tube QAC breakfast  . mouth rinse  15 mL Mouth Rinse BID  . metoprolol tartrate  12.5 mg Oral BID  . moving right along book   Does not apply Once  . multivitamin  1 tablet Oral QHS  . pantoprazole  40 mg Oral Daily  . patient's guide to using coumadin book   Does not apply Once  . sodium chloride flush  3 mL Intravenous Q12H  . tamsulosin  0.4 mg Oral QHS  . warfarin  5 mg Oral q1800  . Warfarin - Physician Dosing Inpatient   Does not apply q1800    BMET    Component Value Date/Time   NA 132 (L) 11/01/2016 0307   K 4.4 11/01/2016 0307   CL 96 (L) 11/01/2016 0307   CO2 21 (L) 11/01/2016 0307   GLUCOSE 154 (H) 11/01/2016 0307   BUN 81 (H) 11/01/2016 0307   CREATININE 5.85 (H) 11/01/2016 0307   CALCIUM 7.9 (L) 11/01/2016 0307   GFRNONAA 8 (L) 11/01/2016 0307   GFRAA 9 (L) 11/01/2016 0307   CBC    Component Value Date/Time   WBC 9.9  11/01/2016 0307   RBC 3.11 (L) 11/01/2016 0307   HGB 9.1 (L) 11/01/2016 0307   HCT 28.4 (L) 11/01/2016 0307   PLT 223 11/01/2016 0307   MCV 91.3 11/01/2016 0307   MCH 29.3 11/01/2016 0307   MCHC 32.0 11/01/2016 0307   RDW 16.6 (H) 11/01/2016 0307   LYMPHSABS 1.1 10/21/2016 0303   MONOABS 1.3 (H) 10/21/2016 0303   EOSABS 0.4 10/21/2016 0303   BASOSABS 0.0 10/21/2016 0303     Assessment/Plan:  1. AKI/CKD stage 3-4- has been oliguric and requiring CVVHD/IHD. CVVHD initiated 10/15/16 and stopped 10/25/16 and tolerated IHD well on 10/27/16. 1. Tolerated IHD well yesterday, will continue with TTS schedule for now and await outpatient placement (lives in [place]).   2. Continue with IHD for now and follow for possible recovery of renal function. 2. CAD s/p CABG x 2 (LIMA, and right SVG) and MAZE procedure 10/09/16. Doing better and off of pressors 3. Anemia of CKD with ABLA- on ESA and transfuse prn 1. feraheme 10/15/16 and again 10/22/16 4. Vascular access- s/p TDC and LUE right BC AVF placed on 10/28/16. Appreciate Dr. Nicole Cella assistance 5. Severe debilitation- continue with PT/OT and will likely require CIR/SNF once stable for discharge. Refused PT on 10/28/16 but encouraged to work with them again today. 6. DM- per primary 7. Hypokalemia- use 4K bath with HD. 8. Insomnia- now ontrazadone qhs and not prn 9. Itching- phos stable, SARNA lotion prn.  Donetta Potts, MD [employer] [phone]

## 2016-11-02 NOTE — Progress Notes (Signed)
5 Days Post-Op Procedure(s) (LRB): ARTERIOVENOUS  FISTULA CREATION right arm (Right) INSERTION OF DIALYSIS CATHETER (N/A) Subjective: No specific complaints. Still has cough productive of sputum at times. Eating better.  Objective: Vital signs in last 24 hours: Temp:  [97.4 F (36.3 C)-98.1 F (36.7 C)] 97.9 F (36.6 C) (10/21 1200) Pulse Rate:  [79-100] 85 (10/21 1200) Cardiac Rhythm: Normal sinus rhythm (10/21 1200) Resp:  [14-26] 20 (10/21 1200) BP: (85-110)/(35-92) 92/50 (10/21 1200) SpO2:  [94 %-100 %] 94 % (10/21 1200) Weight:  [94.8 kg (208 lb 15.9 oz)] 94.8 kg (208 lb 15.9 oz) (10/21 0400)  Hemodynamic parameters for last 24 hours:    Intake/Output from previous day: 10/20 0701 - 10/21 0700 In: 840 [P.O.:830; I.V.:10] Out: 200 [Stool:200] Intake/Output this shift: Total I/O In: 360 [P.O.:360] Out: -   General appearance: alert and cooperative Neurologic: intact Heart: RRR Lungs: coarse breath sounds with rhonchi Extremities: extremities normal, atraumatic, no cyanosis or edema Wound: incision ok  Lab Results:  Recent Labs  10/31/16 0245 11/01/16 0307  WBC 10.4 9.9  HGB 8.9* 9.1*  HCT 28.0* 28.4*  PLT 203 223   BMET:  Recent Labs  10/31/16 0039 11/01/16 0307  NA 135 132*  K 3.5 4.4  CL 100* 96*  CO2 23 21*  GLUCOSE 185* 154*  BUN 58* 81*  CREATININE 4.45* 5.85*  CALCIUM 8.1* 7.9*    PT/INR:  Recent Labs  11/02/16 0426  LABPROT 16.5*  INR 1.35   ABG    Component Value Date/Time   PHART 7.417 10/20/2016 0337   HCO3 27.5 10/20/2016 0337   TCO2 26 10/27/2016 1632   ACIDBASEDEF 1.0 10/17/2016 0007   O2SAT 71.5 10/20/2016 0345   CBG (last 3)   Recent Labs  11/01/16 2214 11/02/16 0730 11/02/16 1154  GLUCAP 174* 70 151*    Assessment/Plan:  Remains hemodynamically stable in sinus rhythm Tolerating HD well INR starting to rise on Coumadin 5 mg. Continue nutrition, IS, ambulation Anticipate CIR reevaluation tomorrow.  LOS:  24 days    Gaye Pollack 11/02/2016

## 2016-11-03 ENCOUNTER — Inpatient Hospital Stay (HOSPITAL_COMMUNITY)
Admission: RE | Admit: 2016-11-03 | Discharge: 2016-11-21 | DRG: 945 | Disposition: A | Discharge status: Home Health | Payer: Medicare Other | Source: Intra-hospital | Attending: Physical Medicine & Rehabilitation | Admitting: Physical Medicine & Rehabilitation

## 2016-11-03 ENCOUNTER — Encounter (HOSPITAL_COMMUNITY): Payer: Self-pay | Admitting: *Deleted

## 2016-11-03 ENCOUNTER — Inpatient Hospital Stay (HOSPITAL_COMMUNITY): Payer: Medicare Other

## 2016-11-03 DIAGNOSIS — Z951 Presence of aortocoronary bypass graft: Secondary | ICD-10-CM

## 2016-11-03 DIAGNOSIS — E669 Obesity, unspecified: Secondary | ICD-10-CM | POA: Diagnosis present

## 2016-11-03 DIAGNOSIS — R140 Abdominal distension (gaseous): Secondary | ICD-10-CM | POA: Diagnosis not present

## 2016-11-03 DIAGNOSIS — R110 Nausea: Secondary | ICD-10-CM

## 2016-11-03 DIAGNOSIS — R7309 Other abnormal glucose: Secondary | ICD-10-CM

## 2016-11-03 DIAGNOSIS — D631 Anemia in chronic kidney disease: Secondary | ICD-10-CM | POA: Diagnosis present

## 2016-11-03 DIAGNOSIS — I5032 Chronic diastolic (congestive) heart failure: Secondary | ICD-10-CM | POA: Diagnosis present

## 2016-11-03 DIAGNOSIS — Z794 Long term (current) use of insulin: Secondary | ICD-10-CM

## 2016-11-03 DIAGNOSIS — I5033 Acute on chronic diastolic (congestive) heart failure: Secondary | ICD-10-CM | POA: Diagnosis not present

## 2016-11-03 DIAGNOSIS — E039 Hypothyroidism, unspecified: Secondary | ICD-10-CM | POA: Diagnosis present

## 2016-11-03 DIAGNOSIS — B369 Superficial mycosis, unspecified: Secondary | ICD-10-CM

## 2016-11-03 DIAGNOSIS — I110 Hypertensive heart disease with heart failure: Secondary | ICD-10-CM

## 2016-11-03 DIAGNOSIS — D72829 Elevated white blood cell count, unspecified: Secondary | ICD-10-CM

## 2016-11-03 DIAGNOSIS — M19021 Primary osteoarthritis, right elbow: Secondary | ICD-10-CM | POA: Diagnosis not present

## 2016-11-03 DIAGNOSIS — R05 Cough: Secondary | ICD-10-CM | POA: Diagnosis not present

## 2016-11-03 DIAGNOSIS — I480 Paroxysmal atrial fibrillation: Secondary | ICD-10-CM | POA: Diagnosis present

## 2016-11-03 DIAGNOSIS — Z9981 Dependence on supplemental oxygen: Secondary | ICD-10-CM

## 2016-11-03 DIAGNOSIS — R1310 Dysphagia, unspecified: Secondary | ICD-10-CM | POA: Diagnosis present

## 2016-11-03 DIAGNOSIS — E162 Hypoglycemia, unspecified: Secondary | ICD-10-CM | POA: Diagnosis not present

## 2016-11-03 DIAGNOSIS — I509 Heart failure, unspecified: Secondary | ICD-10-CM

## 2016-11-03 DIAGNOSIS — Z981 Arthrodesis status: Secondary | ICD-10-CM

## 2016-11-03 DIAGNOSIS — I272 Pulmonary hypertension, unspecified: Secondary | ICD-10-CM

## 2016-11-03 DIAGNOSIS — R5381 Other malaise: Principal | ICD-10-CM

## 2016-11-03 DIAGNOSIS — M069 Rheumatoid arthritis, unspecified: Secondary | ICD-10-CM | POA: Diagnosis present

## 2016-11-03 DIAGNOSIS — Z961 Presence of intraocular lens: Secondary | ICD-10-CM | POA: Diagnosis present

## 2016-11-03 DIAGNOSIS — R791 Abnormal coagulation profile: Secondary | ICD-10-CM | POA: Diagnosis not present

## 2016-11-03 DIAGNOSIS — J449 Chronic obstructive pulmonary disease, unspecified: Secondary | ICD-10-CM | POA: Diagnosis present

## 2016-11-03 DIAGNOSIS — E1140 Type 2 diabetes mellitus with diabetic neuropathy, unspecified: Secondary | ICD-10-CM | POA: Diagnosis not present

## 2016-11-03 DIAGNOSIS — Z87891 Personal history of nicotine dependence: Secondary | ICD-10-CM

## 2016-11-03 DIAGNOSIS — E890 Postprocedural hypothyroidism: Secondary | ICD-10-CM | POA: Diagnosis present

## 2016-11-03 DIAGNOSIS — E1122 Type 2 diabetes mellitus with diabetic chronic kidney disease: Secondary | ICD-10-CM | POA: Diagnosis present

## 2016-11-03 DIAGNOSIS — N179 Acute kidney failure, unspecified: Secondary | ICD-10-CM | POA: Diagnosis present

## 2016-11-03 DIAGNOSIS — D62 Acute posthemorrhagic anemia: Secondary | ICD-10-CM | POA: Diagnosis not present

## 2016-11-03 DIAGNOSIS — I953 Hypotension of hemodialysis: Secondary | ICD-10-CM | POA: Diagnosis not present

## 2016-11-03 DIAGNOSIS — I2510 Atherosclerotic heart disease of native coronary artery without angina pectoris: Secondary | ICD-10-CM | POA: Diagnosis present

## 2016-11-03 DIAGNOSIS — E11649 Type 2 diabetes mellitus with hypoglycemia without coma: Secondary | ICD-10-CM | POA: Diagnosis not present

## 2016-11-03 DIAGNOSIS — D509 Iron deficiency anemia, unspecified: Secondary | ICD-10-CM | POA: Diagnosis present

## 2016-11-03 DIAGNOSIS — I132 Hypertensive heart and chronic kidney disease with heart failure and with stage 5 chronic kidney disease, or end stage renal disease: Secondary | ICD-10-CM | POA: Diagnosis present

## 2016-11-03 DIAGNOSIS — E1142 Type 2 diabetes mellitus with diabetic polyneuropathy: Secondary | ICD-10-CM | POA: Diagnosis present

## 2016-11-03 DIAGNOSIS — E785 Hyperlipidemia, unspecified: Secondary | ICD-10-CM | POA: Diagnosis present

## 2016-11-03 DIAGNOSIS — Z96651 Presence of right artificial knee joint: Secondary | ICD-10-CM | POA: Diagnosis present

## 2016-11-03 DIAGNOSIS — D638 Anemia in other chronic diseases classified elsewhere: Secondary | ICD-10-CM | POA: Diagnosis not present

## 2016-11-03 DIAGNOSIS — Z79890 Hormone replacement therapy (postmenopausal): Secondary | ICD-10-CM

## 2016-11-03 DIAGNOSIS — M79631 Pain in right forearm: Secondary | ICD-10-CM

## 2016-11-03 DIAGNOSIS — Z974 Presence of external hearing-aid: Secondary | ICD-10-CM

## 2016-11-03 DIAGNOSIS — H9190 Unspecified hearing loss, unspecified ear: Secondary | ICD-10-CM | POA: Diagnosis present

## 2016-11-03 DIAGNOSIS — K5900 Constipation, unspecified: Secondary | ICD-10-CM | POA: Diagnosis present

## 2016-11-03 DIAGNOSIS — Z89422 Acquired absence of other left toe(s): Secondary | ICD-10-CM

## 2016-11-03 DIAGNOSIS — N400 Enlarged prostate without lower urinary tract symptoms: Secondary | ICD-10-CM | POA: Diagnosis present

## 2016-11-03 DIAGNOSIS — R0602 Shortness of breath: Secondary | ICD-10-CM | POA: Diagnosis not present

## 2016-11-03 DIAGNOSIS — Z79899 Other long term (current) drug therapy: Secondary | ICD-10-CM

## 2016-11-03 DIAGNOSIS — M79644 Pain in right finger(s): Secondary | ICD-10-CM

## 2016-11-03 DIAGNOSIS — Z6830 Body mass index (BMI) 30.0-30.9, adult: Secondary | ICD-10-CM

## 2016-11-03 DIAGNOSIS — Z992 Dependence on renal dialysis: Secondary | ICD-10-CM

## 2016-11-03 DIAGNOSIS — R159 Full incontinence of feces: Secondary | ICD-10-CM | POA: Diagnosis not present

## 2016-11-03 DIAGNOSIS — Z96612 Presence of left artificial shoulder joint: Secondary | ICD-10-CM | POA: Diagnosis present

## 2016-11-03 DIAGNOSIS — Z930 Tracheostomy status: Secondary | ICD-10-CM

## 2016-11-03 DIAGNOSIS — N186 End stage renal disease: Secondary | ICD-10-CM | POA: Diagnosis present

## 2016-11-03 DIAGNOSIS — I5020 Unspecified systolic (congestive) heart failure: Secondary | ICD-10-CM | POA: Diagnosis not present

## 2016-11-03 DIAGNOSIS — K219 Gastro-esophageal reflux disease without esophagitis: Secondary | ICD-10-CM | POA: Diagnosis present

## 2016-11-03 DIAGNOSIS — Z7982 Long term (current) use of aspirin: Secondary | ICD-10-CM

## 2016-11-03 DIAGNOSIS — N183 Chronic kidney disease, stage 3 (moderate): Secondary | ICD-10-CM | POA: Diagnosis not present

## 2016-11-03 DIAGNOSIS — E213 Hyperparathyroidism, unspecified: Secondary | ICD-10-CM | POA: Diagnosis present

## 2016-11-03 DIAGNOSIS — N184 Chronic kidney disease, stage 4 (severe): Secondary | ICD-10-CM | POA: Diagnosis not present

## 2016-11-03 DIAGNOSIS — Z79891 Long term (current) use of opiate analgesic: Secondary | ICD-10-CM

## 2016-11-03 DIAGNOSIS — E8770 Fluid overload, unspecified: Secondary | ICD-10-CM | POA: Diagnosis not present

## 2016-11-03 DIAGNOSIS — T380X5A Adverse effect of glucocorticoids and synthetic analogues, initial encounter: Secondary | ICD-10-CM | POA: Diagnosis not present

## 2016-11-03 DIAGNOSIS — Z7901 Long term (current) use of anticoagulants: Secondary | ICD-10-CM

## 2016-11-03 LAB — PROTIME-INR
INR: 1.65
PROTHROMBIN TIME: 19.3 s — AB (ref 11.4–15.2)

## 2016-11-03 LAB — GLUCOSE, CAPILLARY
GLUCOSE-CAPILLARY: 130 mg/dL — AB (ref 65–99)
Glucose-Capillary: 87 mg/dL (ref 65–99)
Glucose-Capillary: 122 mg/dL — ABNORMAL HIGH (ref 65–99)
Glucose-Capillary: 98 mg/dL (ref 65–99)

## 2016-11-03 LAB — PHOSPHORUS: Phosphorus: 4.2 mg/dL (ref 2.5–4.6)

## 2016-11-03 MED ORDER — GABAPENTIN 300 MG PO CAPS
300.0000 mg | ORAL_CAPSULE | Freq: Two times a day (BID) | ORAL | Status: DC
  Administered 2016-11-03 – 2016-11-18 (×30): 300 mg via ORAL
  Filled 2016-11-03 (×31): qty 1

## 2016-11-03 MED ORDER — HYDROCORTISONE 0.5 % EX CREA
TOPICAL_CREAM | Freq: Two times a day (BID) | CUTANEOUS | Status: DC
  Administered 2016-11-03: 10:00:00 via TOPICAL
  Filled 2016-11-03: qty 28.35

## 2016-11-03 MED ORDER — INSULIN ASPART 100 UNIT/ML ~~LOC~~ SOLN
0.0000 [IU] | Freq: Three times a day (TID) | SUBCUTANEOUS | Status: DC
  Administered 2016-11-04: 2 [IU] via SUBCUTANEOUS
  Administered 2016-11-05: 3 [IU] via SUBCUTANEOUS
  Administered 2016-11-06: 2 [IU] via SUBCUTANEOUS
  Administered 2016-11-07: 3 [IU] via SUBCUTANEOUS
  Administered 2016-11-07: 2 [IU] via SUBCUTANEOUS
  Administered 2016-11-08: 3 [IU] via SUBCUTANEOUS
  Administered 2016-11-09 – 2016-11-12 (×4): 2 [IU] via SUBCUTANEOUS
  Administered 2016-11-13 – 2016-11-14 (×2): 5 [IU] via SUBCUTANEOUS
  Administered 2016-11-14 – 2016-11-16 (×3): 3 [IU] via SUBCUTANEOUS
  Administered 2016-11-16: 2 [IU] via SUBCUTANEOUS
  Administered 2016-11-17: 5 [IU] via SUBCUTANEOUS
  Administered 2016-11-17: 2 [IU] via SUBCUTANEOUS
  Administered 2016-11-18: 3 [IU] via SUBCUTANEOUS
  Administered 2016-11-19: 2 [IU] via SUBCUTANEOUS

## 2016-11-03 MED ORDER — TAMSULOSIN HCL 0.4 MG PO CAPS
0.4000 mg | ORAL_CAPSULE | Freq: Every day | ORAL | Status: DC
  Administered 2016-11-03 – 2016-11-20 (×17): 0.4 mg via ORAL
  Filled 2016-11-03 (×17): qty 1

## 2016-11-03 MED ORDER — AMIODARONE HCL 200 MG PO TABS
200.0000 mg | ORAL_TABLET | Freq: Two times a day (BID) | ORAL | Status: DC
  Administered 2016-11-03 – 2016-11-20 (×34): 200 mg via ORAL
  Filled 2016-11-03 (×35): qty 1

## 2016-11-03 MED ORDER — ONDANSETRON HCL 4 MG PO TABS
4.0000 mg | ORAL_TABLET | Freq: Four times a day (QID) | ORAL | Status: DC | PRN
  Administered 2016-11-06 – 2016-11-09 (×4): 4 mg via ORAL
  Filled 2016-11-03 (×4): qty 1

## 2016-11-03 MED ORDER — CAMPHOR-MENTHOL 0.5-0.5 % EX LOTN: TOPICAL_LOTION | CUTANEOUS | 0 refills | Status: AC | PRN

## 2016-11-03 MED ORDER — SODIUM CHLORIDE 0.45 % IV SOLN: INTRAVENOUS | Status: DC

## 2016-11-03 MED ORDER — ASPIRIN 81 MG PO CHEW
81.0000 mg | CHEWABLE_TABLET | Freq: Every day | ORAL | Status: DC
  Administered 2016-11-04 – 2016-11-21 (×18): 81 mg
  Filled 2016-11-03 (×18): qty 1

## 2016-11-03 MED ORDER — PANTOPRAZOLE SODIUM 40 MG PO TBEC
40.0000 mg | DELAYED_RELEASE_TABLET | Freq: Every day | ORAL | Status: DC
  Administered 2016-11-04 – 2016-11-21 (×18): 40 mg via ORAL
  Filled 2016-11-03 (×18): qty 1

## 2016-11-03 MED ORDER — WARFARIN - PHYSICIAN DOSING INPATIENT: Freq: Every day | Status: DC

## 2016-11-03 MED ORDER — METOPROLOL TARTRATE 12.5 MG HALF TABLET
12.5000 mg | ORAL_TABLET | Freq: Two times a day (BID) | ORAL | Status: DC
  Administered 2016-11-03 – 2016-11-05 (×3): 12.5 mg via ORAL
  Filled 2016-11-03 (×3): qty 1

## 2016-11-03 MED ORDER — ACETAMINOPHEN 325 MG PO TABS: 650.0000 mg | ORAL_TABLET | Freq: Four times a day (QID) | ORAL | Status: AC | PRN

## 2016-11-03 MED ORDER — GLUCERNA SHAKE PO LIQD
237.0000 mL | Freq: Two times a day (BID) | ORAL | Status: DC
  Administered 2016-11-04 – 2016-11-20 (×24): 237 mL via ORAL

## 2016-11-03 MED ORDER — HYDROCORTISONE 0.5 % EX CREA: TOPICAL_CREAM | Freq: Two times a day (BID) | CUTANEOUS | 0 refills | Status: DC

## 2016-11-03 MED ORDER — TRAMADOL HCL 50 MG PO TABS
50.0000 mg | ORAL_TABLET | Freq: Two times a day (BID) | ORAL | Status: DC | PRN
Start: 2016-11-03 — End: 2016-11-21

## 2016-11-03 MED ORDER — TRAZODONE HCL 50 MG PO TABS
25.0000 mg | ORAL_TABLET | Freq: Every evening | ORAL | Status: DC | PRN
  Administered 2016-11-03 – 2016-11-17 (×11): 25 mg via ORAL
  Filled 2016-11-03 (×12): qty 1

## 2016-11-03 MED ORDER — WARFARIN SODIUM 5 MG PO TABS: 5.0000 mg | ORAL_TABLET | Freq: Every day | ORAL | Status: DC

## 2016-11-03 MED ORDER — SORBITOL 70 % SOLN
30.0000 mL | Freq: Every day | Status: DC | PRN
  Administered 2016-11-09 – 2016-11-13 (×2): 30 mL via ORAL
  Filled 2016-11-03 (×2): qty 30

## 2016-11-03 MED ORDER — GERHARDT'S BUTT CREAM
TOPICAL_CREAM | Freq: Two times a day (BID) | CUTANEOUS | Status: DC
  Administered 2016-11-03 – 2016-11-20 (×35): via TOPICAL
  Filled 2016-11-03 (×4): qty 1

## 2016-11-03 MED ORDER — ABATACEPT 250 MG IV SOLR: INTRAVENOUS | 12 refills | Status: AC

## 2016-11-03 MED ORDER — WARFARIN - PHARMACIST DOSING INPATIENT
Freq: Every day | Status: DC
  Administered 2016-11-04: 18:00:00
  Administered 2016-11-05: 5
  Administered 2016-11-08 – 2016-11-17 (×7)

## 2016-11-03 MED ORDER — INSULIN DETEMIR 100 UNIT/ML ~~LOC~~ SOLN
28.0000 [IU] | Freq: Every day | SUBCUTANEOUS | Status: DC
Start: 2016-11-04 — End: 2016-11-09
  Administered 2016-11-04 – 2016-11-08 (×5): 28 [IU] via SUBCUTANEOUS
  Filled 2016-11-03 (×7): qty 0.28

## 2016-11-03 MED ORDER — GUAIFENESIN 100 MG/5ML PO SOLN: 20.0000 mL | ORAL | 0 refills | Status: DC | PRN

## 2016-11-03 MED ORDER — WARFARIN SODIUM 5 MG PO TABS
5.0000 mg | ORAL_TABLET | Freq: Once | ORAL | Status: AC
  Administered 2016-11-03: 5 mg via ORAL
  Filled 2016-11-03: qty 1

## 2016-11-03 MED ORDER — HYDROCORTISONE 0.5 % EX CREA
TOPICAL_CREAM | Freq: Two times a day (BID) | CUTANEOUS | Status: DC
  Administered 2016-11-03 – 2016-11-07 (×8): via TOPICAL
  Administered 2016-11-08: 1 via TOPICAL
  Administered 2016-11-08 – 2016-11-16 (×17): via TOPICAL
  Administered 2016-11-17: 1 via TOPICAL
  Administered 2016-11-17 – 2016-11-20 (×6): via TOPICAL
  Filled 2016-11-03 (×2): qty 28.35

## 2016-11-03 MED ORDER — LEVOTHYROXINE SODIUM 25 MCG PO TABS
25.0000 ug | ORAL_TABLET | Freq: Every day | ORAL | Status: DC
  Administered 2016-11-04 – 2016-11-21 (×18): 25 ug
  Filled 2016-11-03 (×18): qty 1

## 2016-11-03 MED ORDER — CAMPHOR-MENTHOL 0.5-0.5 % EX LOTN
TOPICAL_LOTION | CUTANEOUS | Status: DC | PRN
  Administered 2016-11-19: 08:00:00 via TOPICAL
  Filled 2016-11-03 (×2): qty 222

## 2016-11-03 MED ORDER — RANITIDINE HCL 150 MG PO TABS: 150.0000 mg | ORAL_TABLET | Freq: Every day | ORAL | Status: DC

## 2016-11-03 MED ORDER — TRAZODONE HCL 50 MG PO TABS
25.0000 mg | ORAL_TABLET | Freq: Every evening | ORAL | Status: DC | PRN
Start: 2016-11-03 — End: 2016-11-21

## 2016-11-03 MED ORDER — DIPHENHYDRAMINE HCL 25 MG PO CAPS: 25.0000 mg | ORAL_CAPSULE | Freq: Four times a day (QID) | ORAL | 0 refills | Status: DC | PRN

## 2016-11-03 MED ORDER — GLUCERNA SHAKE PO LIQD: 237.0000 mL | Freq: Two times a day (BID) | ORAL | 0 refills | Status: AC

## 2016-11-03 MED ORDER — GUAIFENESIN 100 MG/5ML PO SOLN
20.0000 mL | ORAL | Status: DC | PRN
  Administered 2016-11-03 – 2016-11-19 (×15): 400 mg via ORAL
  Filled 2016-11-03: qty 10
  Filled 2016-11-03: qty 20
  Filled 2016-11-03: qty 30
  Filled 2016-11-03: qty 10
  Filled 2016-11-03: qty 20
  Filled 2016-11-03: qty 10
  Filled 2016-11-03 (×2): qty 20
  Filled 2016-11-03: qty 10
  Filled 2016-11-03 (×2): qty 15
  Filled 2016-11-03: qty 30
  Filled 2016-11-03: qty 20
  Filled 2016-11-03: qty 30
  Filled 2016-11-03: qty 10
  Filled 2016-11-03: qty 40
  Filled 2016-11-03: qty 20
  Filled 2016-11-03: qty 40
  Filled 2016-11-03 (×2): qty 10
  Filled 2016-11-03: qty 20
  Filled 2016-11-03: qty 10
  Filled 2016-11-03: qty 20

## 2016-11-03 MED ORDER — ONDANSETRON HCL 4 MG/2ML IJ SOLN
4.0000 mg | Freq: Four times a day (QID) | INTRAMUSCULAR | Status: DC | PRN
  Filled 2016-11-03: qty 2

## 2016-11-03 MED ORDER — ACETAMINOPHEN 325 MG PO TABS
650.0000 mg | ORAL_TABLET | Freq: Four times a day (QID) | ORAL | Status: DC | PRN
  Administered 2016-11-04 – 2016-11-19 (×9): 650 mg via ORAL
  Filled 2016-11-03 (×6): qty 2

## 2016-11-03 MED ORDER — WARFARIN SODIUM 2.5 MG PO TABS: 2.5000 mg | ORAL_TABLET | Freq: Every day | ORAL | Status: DC

## 2016-11-03 MED ORDER — ATORVASTATIN CALCIUM 40 MG PO TABS: 40.0000 mg | ORAL_TABLET | Freq: Every day | ORAL | Status: DC

## 2016-11-03 MED ORDER — RENA-VITE PO TABS: 1.0000 | ORAL_TABLET | Freq: Every day | ORAL | 0 refills | Status: DC

## 2016-11-03 MED ORDER — TRAMADOL HCL 50 MG PO TABS
50.0000 mg | ORAL_TABLET | Freq: Two times a day (BID) | ORAL | Status: DC | PRN
  Administered 2016-11-04 – 2016-11-16 (×10): 50 mg via ORAL
  Filled 2016-11-03 (×9): qty 1

## 2016-11-03 MED ORDER — ATORVASTATIN CALCIUM 40 MG PO TABS
40.0000 mg | ORAL_TABLET | Freq: Every day | ORAL | Status: DC
  Administered 2016-11-03 – 2016-11-19 (×16): 40 mg via ORAL
  Filled 2016-11-03 (×16): qty 1

## 2016-11-03 MED ORDER — AMIODARONE HCL 200 MG PO TABS: 200.0000 mg | ORAL_TABLET | Freq: Two times a day (BID) | ORAL | Status: DC

## 2016-11-03 MED ORDER — METOPROLOL TARTRATE 25 MG PO TABS: 12.5000 mg | ORAL_TABLET | Freq: Two times a day (BID) | ORAL | Status: DC

## 2016-11-03 MED ORDER — RENA-VITE PO TABS
1.0000 | ORAL_TABLET | Freq: Every day | ORAL | Status: DC
  Administered 2016-11-03 – 2016-11-20 (×17): 1 via ORAL
  Filled 2016-11-03 (×17): qty 1

## 2016-11-03 MED ORDER — ENOXAPARIN SODIUM 30 MG/0.3ML ~~LOC~~ SOLN
30.0000 mg | SUBCUTANEOUS | Status: DC
  Administered 2016-11-04: 30 mg via SUBCUTANEOUS
  Filled 2016-11-03: qty 0.3

## 2016-11-03 MED ORDER — DIPHENHYDRAMINE HCL 25 MG PO CAPS
25.0000 mg | ORAL_CAPSULE | Freq: Four times a day (QID) | ORAL | Status: DC | PRN
  Administered 2016-11-03: 25 mg via ORAL
  Filled 2016-11-03: qty 1

## 2016-11-03 MED ORDER — FINASTERIDE 5 MG PO TABS
5.0000 mg | ORAL_TABLET | Freq: Every day | ORAL | Status: DC
  Administered 2016-11-03 – 2016-11-20 (×17): 5 mg via ORAL
  Filled 2016-11-03 (×17): qty 1

## 2016-11-03 NOTE — Consult Note (Signed)
   [facility] CM Inpatient Consult   11/03/2016  [name] [birth year] [id number]   Update:  [facility] follow up:  Patient is currently admitting to inpatient rehab. Please contact [facility] Management if patient has community care management needs for disposition after rehab.  For questions, please contact:  Natividad Brood, RN BSN [facility] Liaison  [phone] business mobile phone Toll free office [phone]

## 2016-11-03 NOTE — Progress Notes (Addendum)
      [address]       [place] [address]             [phone]        CARDIOTHORACIC SURGERY PROGRESS NOTE  R25Days Post-Op Procedure(s) (LRB): CORONARY ARTERY BYPASS GRAFTING (CABG) x two, using left internal mammary artery and right leg greater saphenous vein harvested endoscopically (N/A) MAZE (N/A) TRANSESOPHAGEAL ECHOCARDIOGRAM (TEE) (N/A) CLIPPING OF ATRIAL APPENDAGE   R6 Days Post-Op Procedure(s) (LRB): ARTERIOVENOUS  FISTULA CREATION right arm (Right) INSERTION OF DIALYSIS CATHETER (N/A)  Subjective: No complaints except itchy back  Objective: Vital signs: BP Readings from Last 1 Encounters:  11/03/16 (!) 111/57   Pulse Readings from Last 1 Encounters:  11/03/16 83   Resp Readings from Last 1 Encounters:  11/03/16 (!) 25   Temp Readings from Last 1 Encounters:  11/03/16 98.6 F (37 C) (Oral)    Hemodynamics:    Physical Exam:  Rhythm:   sinus  Breath sounds: clear  Heart sounds:  RRR w/out murmur  Incisions:  Clean and dry  Abdomen:  Soft, non-distended, non-tender  Extremities:  Warm, well-perfused    Intake/Output from previous day: 10/21 0701 - 10/22 0700 In: 750 [P.O.:750] Out: -  Intake/Output this shift: No intake/output data recorded.  Lab Results:  CBC: Recent Labs  11/01/16 0307  WBC 9.9  HGB 9.1*  HCT 28.4*  PLT 223    BMET:  Recent Labs  11/01/16 0307  NA 132*  K 4.4  CL 96*  CO2 21*  GLUCOSE 154*  BUN 81*  CREATININE 5.85*  CALCIUM 7.9*     PT/INR:   Recent Labs  11/03/16 0211  LABPROT 19.3*  INR 1.65    CBG (last 3)   Recent Labs  11/02/16 1638 11/02/16 2131 11/03/16 0803  GLUCAP 88 92 130*    ABG    Component Value Date/Time   PHART 7.417 10/20/2016 0337   PCO2ART 42.7 10/20/2016 0337   PO2ART 76.0 (L) 10/20/2016 0337   HCO3 27.5 10/20/2016 0337   TCO2 26 10/27/2016 1632   ACIDBASEDEF 1.0 10/17/2016 0007   O2SAT 71.5 10/20/2016 0345     CXR: n/a  Assessment/Plan:  Clinically stable and slowly improving Maintaining NSR w/ stable BP Breathing comfortably on room air INR trending up Would benefit from 1-2 weeks of intense rehab prior to going home Ready for D/C to [place], MD 11/03/2016 9:12 AM

## 2016-11-03 NOTE — H&P (Signed)
Physical Medicine and Rehabilitation Admission H&P    Chief complaint: Weakness  HPI: [name] is a 83 y.o.right handed male with history of hypertension, chronic diastolic congestive heart failure, stage IV chronic renal insufficiency, recurrent PAF on long-term anticoagulation, COPD with remote tobacco abuse, rheumatoid arthritis and diabetes mellitus. Per chart review and family, patient lives with spouse. Independent prior to admission. One level home with ramp entrance. Presented 10/08/2016 with progressive shortness of breath. Noninvasive echocardiogram with ejection fraction of ~45% no signs of ischemia. Elective cardiac catheterization with noted CAD. Underwent CABG/MAZE procedure 10/09/2016 per Dr. Roxy Manns. Hospital course pain management. Sternal precautions as directed. Nephrology consulted for AKI on CKD. Patient has been receiving hemodialysis Monday Wednesday Friday schedule and monitoring for possible recovery of renal function with latest creatinine 2.99 and plan for ongoing hemodialysis as outpatient . Patient did undergo right IJ tunneled dialysis catheter 37/34/2876/OTLXB brachiocephalic AV fistula per Dr. Scot Dock. Chronic Coumadin therapy has been resumed. Acute on chronic anemia 10.2 and monitored.[name] MBS completed 10/29/2016 currently on a dysphagia #3 honey thick liquid diet. Physical and occupational therapy evaluations completed with recommendations of physical medicine rehabilitation consult. Patient was admitted for a comprehensive rehabilitation program.  Review of Systems  Unable to perform ROS: Mental acuity   Past Medical History:  Diagnosis Date  . Anemia   . Asthma   . Atrial fibrillation ([place]) 12/08/2013  . CHF (congestive heart failure) ([place])   . Chronic joint pain    "from RA" (10/01/2016)  . CKD (chronic kidney disease), stage III ([place])   . Constipation    takes stool softener daily  . COPD (chronic obstructive pulmonary disease) ([place])   . Coronary  artery disease   . Coronary artery disease involving native coronary artery of native heart with angina pectoris ([place]) 09/30/2016  . Dysrhythmia    a-fib  . GERD (gastroesophageal reflux disease)    takes Zantac daily   . History of blood transfusion    "related to one of my ORs"  . Hyperlipidemia    takes Pravastatin daily  . Hypertension    takes Cardizem,Proscar,and Lisinopril daily  . Hypothyroidism   . Impaired hearing    wears hearing aids  . Joint swelling    "from RA" (10/01/2016)  . Peripheral neuropathy   . Pneumonia 2017  . Rheumatoid arthritis ([place])    "all over; take orencia  q 4 wks" (10/01/2016)  . S/P CABG x 2 10/09/2016   LIMA to LAD, SVG to OM, EVH via right thigh  . S/P Maze operation for atrial fibrillation 10/09/2016   Complete bilateral atrial lesion set using bipolar radiofrequency and cryothermy ablation with clipping of LA appendage  . Type II diabetes mellitus ([place])    Past Surgical History:  Procedure Laterality Date  . AMPUTATION TOE Left 11/08/2015   Procedure: LEFT HALLUX AMPUTATION AT THE METATARSOPHALANGEAL JOINT;  Surgeon: Wylene Simmer, MD;  Location: [facility];  Service: Orthopedics;  Laterality: Left;  . ANKLE FUSION Left 2014  . AV FISTULA PLACEMENT Right 10/28/2016   Procedure: ARTERIOVENOUS  FISTULA CREATION right arm;  Surgeon: Angelia Mould, MD;  Location: [facility];  Service: Vascular;  Laterality: Right;  . BIOPSY THYROID  2014  . CARDIAC CATHETERIZATION  09/30/2016  . CARPAL TUNNEL RELEASE Bilateral 1990's  . CATARACT EXTRACTION W/ INTRAOCULAR LENS  IMPLANT, BILATERAL Bilateral 1990's  . CLIPPING OF ATRIAL APPENDAGE  10/09/2016   Procedure: CLIPPING OF ATRIAL APPENDAGE;  Surgeon: Roxy Manns,  Valentina Gu, MD;  Location: [facility];  Service: Open Heart Surgery;;  . COLONOSCOPY    . CORONARY ARTERY BYPASS GRAFT N/A 10/09/2016   Procedure: CORONARY ARTERY BYPASS GRAFTING (CABG) x two, using left internal mammary artery and right leg  greater saphenous vein harvested endoscopically;  Surgeon: Rexene Alberts, MD;  Location: [facility];  Service: Open Heart Surgery;  Laterality: N/A;  . FOREIGN BODY REMOVAL Right 09/06/2013   Procedure: REMOVAL FOREIGN BODY RIGHT INDEX FINGER;  Surgeon: Daryll Brod, MD;  Location: [facility];  Service: Orthopedics;  Laterality: Right;  ANESTHESIA: IV REGIONAL FAB  . INSERTION OF DIALYSIS CATHETER N/A 10/28/2016   Procedure: INSERTION OF DIALYSIS CATHETER;  Surgeon: Angelia Mould, MD;  [facility];  Service: Vascular;  Laterality: N/A;  . JOINT REPLACEMENT    . MAZE N/A 10/09/2016   Procedure: MAZE;  Surgeon: Rexene Alberts, MD;  Location: [facility];  Service: Open Heart Surgery;  Laterality: N/A;  . REPAIR TENDONS FOOT Left 1946; 1984   "farming accident; repaired tendons both times"  . RIGHT/LEFT HEART CATH AND CORONARY ANGIOGRAPHY N/A 09/30/2016   Procedure: RIGHT/LEFT HEART CATH AND CORONARY ANGIOGRAPHY;  Surgeon: Nigel Mormon, MD;  Location: [facility];  Service: Cardiovascular;  Laterality: N/A;  . SHOULDER ARTHROSCOPY W/ ROTATOR CUFF REPAIR Right 1990's  . TEE WITHOUT CARDIOVERSION N/A 10/09/2016   Procedure: TRANSESOPHAGEAL ECHOCARDIOGRAM (TEE);  Surgeon: Rexene Alberts, MD;  Location: [facility];  Service: Open Heart Surgery;  Laterality: N/A;  . THYROIDECTOMY Right 02/01/2013   Procedure: RIGHT THYROIDECTOMY LOBECTOMY;  Surgeon: Earnstine Regal, MD;  Location: [facility];  Service: General;  Laterality: Right;  . TONSILLECTOMY  1930's  . TOTAL KNEE ARTHROPLASTY Right 12/16/2010   Procedure: TOTAL KNEE ARTHROPLASTY; rt Surgeon: Rudean Haskell, MD;  Location: [facility];  Service: Orthopedics;  Laterality: Right;  . TOTAL SHOULDER REPLACEMENT Left 03/2010   Family History  Problem Relation Age of Onset  . Cancer Mother        breast  . Aneurysm Father    Social History:  reports that he quit smoking about 51 years ago. His smoking use included Cigarettes. He has a  15.00 pack-year smoking history. He has never used smokeless tobacco. He reports that he does not drink alcohol or use drugs. Allergies: No Known Allergies Medications Prior to Admission  Medication Sig Dispense Refill  . Abatacept (ORENCIA IV) Inject into the vein every 30 (thirty) days. Infusion due again October 2018    . albuterol (PROVENTIL) (2.5 MG/3ML) 0.083% nebulizer solution Take 3 mLs (2.5 mg total) by nebulization every 6 (six) hours as needed for wheezing or shortness of breath. 360 mL 11  . aspirin 81 MG chewable tablet Chew 1 tablet (81 mg total) by mouth daily.    [name] atorvastatin (LIPITOR) 40 MG tablet Take 40 mg by mouth daily.    . ferrous sulfate 325 (65 FE) MG tablet Take 325 mg by mouth daily with breakfast.     . finasteride (PROSCAR) 5 MG tablet Take 5 mg by mouth at bedtime.     . fluticasone furoate-vilanterol (BREO ELLIPTA) 100-25 MCG/INH AEPB Inhale 1 puff into the lungs daily. 60 each 5  . folic acid (FOLVITE) 1 MG tablet Take 1 mg by mouth daily.      . furosemide (LASIX) 80 MG tablet Take 80 mg by mouth 2 (two) times daily.    [name] gabapentin (NEURONTIN) 300 MG capsule Take  1 capsule (300 mg total) by mouth 2 (two) times daily. 15 capsule 0  . guaiFENesin (MUCINEX) 600 MG 12 hr tablet Take 1,200 mg by mouth 2 (two) times daily.     [name] HYDROcodone-acetaminophen (NORCO) 5-325 MG per tablet Take 1 tablet by mouth every 6 (six) hours as needed for moderate pain. (Patient taking differently: Take 1 tablet by mouth every 6 (six) hours as needed for moderate pain. FOR [facility] FROM [facility]) 30 tablet 0  . Insulin Glargine (TOUJEO SOLOSTAR) 300 UNIT/ML SOPN Inject 14 Units into the skin at bedtime.     . isosorbide-hydrALAZINE (BIDIL) 20-37.5 MG tablet Take 1 tablet by mouth 3 (three) times daily.    [name] leflunomide (ARAVA) 20 MG tablet Take 20 mg by mouth daily.     [name] levothyroxine (SYNTHROID, LEVOTHROID) 25 MCG tablet Take 25 mcg by mouth daily before breakfast.    . metoprolol  (LOPRESSOR) 50 MG tablet Take 25 mg by mouth 2 (two) times daily.     . ranitidine (ZANTAC) 150 MG tablet Take 150 mg by mouth 2 (two) times daily.      . Tamsulosin HCl (FLOMAX) 0.4 MG CAPS Take 0.4 mg by mouth at bedtime.     . traZODone (DESYREL) 50 MG tablet Take 50 mg by mouth at bedtime as needed for sleep.      Drug Regimen Review Drug regimen was reviewed remains appropriately with no significant issues identified.  Home: Home Living Family/patient expects to be discharged to:: Private residence Living Arrangements:  (spouse has memory issues but Independent with adls and woith) Available Help at Discharge: Family, Available 24 hours/day (5 living children who can provide 24/7 supervision of pt and) Type of Home: House Home Access: Ramped entrance [place]: One level Bathroom Shower/Tub: Multimedia programmer: Standard Bathroom Accessibility: Yes Home Equipment: Toilet riser, Grab bars - tub/shower, Shower seat - built in, [employer] - toilet, [place] - single point, Environmental consultant - 4 wheels  Lives With: Spouse   Functional History: Prior Function Level of Independence: Independent with assistive device(s) Comments: Pt used rollator or cane PTA.  Has bad ankles per family  Functional Status:  Mobility: Bed Mobility Overal bed mobility: Needs Assistance Bed Mobility: Sit to Supine Sit to supine: Max assist, +2 for physical assistance General bed mobility comments: required maxA for LE management into bed, pt with increased fatigue after ambulation Transfers Overall transfer level: Needs assistance Equipment used:  (EVA walker) Transfers: Sit to/from Stand Sit to Stand: Mod assist, +2 physical assistance General transfer comment: ModAx2 for power up varying but even with min assist for power up mod cues needed for safety each time to assist with lift.  Assist and cues for steadying and reaching to EVA walker.   Ambulation/Gait Ambulation/Gait assistance: +2 physical  assistance, Min assist Ambulation Distance (Feet): 200 Feet Assistive device:  (EVA walker) Gait Pattern/deviations: Step-through pattern, Decreased step length - right, Decreased step length - left, Trunk flexed, Antalgic, Narrow base of support, Drifts right/left, Shuffle General Gait Details: mod to min A for steadying with gait with mod cues to sequence steps and eva walker, vc for encouragement, anterior pelvic tilt, and looking up and out with gait.  Pt also followed with chair and took 1 seated rest break.   Gait velocity: slowed Gait velocity interpretation: Below normal speed for age/gender    ADL: ADL Overall ADL's : Needs assistance/impaired Eating/Feeding: Sitting, Minimal assistance Grooming: Minimal assistance, Sitting Upper Body Bathing: Sitting, Moderate  assistance Lower Body Bathing: Maximal assistance, Sit to/from stand, +2 for safety/equipment Upper Body Dressing : Sitting, Moderate assistance Lower Body Dressing: Maximal assistance, Sit to/from stand, +2 for safety/equipment Toilet Transfer: Moderate assistance, +2 for physical assistance, +2 for safety/equipment, Ambulation, BSC (+3 for chair follow; with [place] walker; cues for safety) Toileting- Clothing Manipulation and Hygiene: Total assistance, Sit to/from stand Functional mobility during ADLs: Moderate assistance (Eva walker) General ADL Comments: Cueing throughout for safety and encouragement. Pt much more motivated after he saw what he could accomplish.   Cognition: Cognition Overall Cognitive Status: Impaired/Different from baseline Orientation Level: Oriented X4 Cognition Arousal/Alertness: Awake/alert Behavior During Therapy: Flat affect Overall Cognitive Status: Impaired/Different from baseline Area of Impairment: Orientation, Attention, Memory, Following commands Orientation Level: Disoriented to, Time Current Attention Level: Selective Memory: Decreased recall of precautions, Decreased short-term  memory Following Commands: Follows multi-step commands consistently Safety/Judgement: Decreased awareness of safety, Decreased awareness of deficits Problem Solving: Slow processing, Requires verbal cues, Requires tactile cues, Decreased initiation, Difficulty sequencing General Comments: demonstrating adherence to sternal precautions Difficult to assess due to: Tracheostomy (with vent)  Physical Exam: Blood pressure (!) 87/50, pulse 71, temperature (!) 97.5 F (36.4 C), temperature source Oral, resp. rate 14, height 5\' 8"  (1.727 m), weight 92.6 kg (204 lb 2.3 oz), SpO2 99 %. Physical Exam  Vitals reviewed. Constitutional: He appears well-developed.  Frail  HENT:  Head: Normocephalic and atraumatic.  Eyes: EOM are normal. Right eye exhibits no discharge. Left eye exhibits no discharge.  Neck: Normal range of motion. Neck supple. No thyromegaly present.  Cardiovascular: Normal rate and regular rhythm.   Respiratory: Effort normal and breath sounds normal. No respiratory distress.  GI: Soft. Bowel sounds are normal. He exhibits no distension.  Musculoskeletal: He exhibits no edema or tenderness.  Neurological:  Extremely lethargic, unable to arouse - per family and nurse, pt received benadryl  Skin: Skin is warm and dry.  Psychiatric:  Unable to assess due to lethargy   Results for orders placed or performed during the hospital encounter of 10/09/16 (from the past 48 hour(s))  Glucose, capillary     Status: None   Collection Time: 11/01/16  1:56 PM  Result Value Ref Range   Glucose-Capillary 87 65 - 99 mg/dL   Comment 1 Notify RN   Glucose, capillary     Status: Abnormal   Collection Time: 11/01/16  3:45 PM  Result Value Ref Range   Glucose-Capillary 153 (H) 65 - 99 mg/dL   Comment 1 Capillary Specimen   Glucose, capillary     Status: Abnormal   Collection Time: 11/01/16 10:14 PM  Result Value Ref Range   Glucose-Capillary 174 (H) 65 - 99 mg/dL   Comment 1 Venous Specimen     Protime-INR     Status: Abnormal   Collection Time: 11/02/16  4:26 AM  Result Value Ref Range   Prothrombin Time 16.5 (H) 11.4 - 15.2 seconds   INR 1.35   Phosphorus     Status: None   Collection Time: 11/02/16  4:26 AM  Result Value Ref Range   Phosphorus 3.2 2.5 - 4.6 mg/dL  Glucose, capillary     Status: None   Collection Time: 11/02/16  7:30 AM  Result Value Ref Range   Glucose-Capillary 70 65 - 99 mg/dL   Comment 1 Capillary Specimen   Glucose, capillary     Status: Abnormal   Collection Time: 11/02/16 11:54 AM  Result Value Ref Range   Glucose-Capillary 151 (H)  65 - 99 mg/dL  Glucose, capillary     Status: None   Collection Time: 11/02/16  4:38 PM  Result Value Ref Range   Glucose-Capillary 88 65 - 99 mg/dL  Glucose, capillary     Status: None   Collection Time: 11/02/16  9:31 PM  Result Value Ref Range   Glucose-Capillary 92 65 - 99 mg/dL   Comment 1 Notify RN   Protime-INR     Status: Abnormal   Collection Time: 11/03/16  2:11 AM  Result Value Ref Range   Prothrombin Time 19.3 (H) 11.4 - 15.2 seconds   INR 1.65   Phosphorus     Status: None   Collection Time: 11/03/16  2:11 AM  Result Value Ref Range   Phosphorus 4.2 2.5 - 4.6 mg/dL  Glucose, capillary     Status: Abnormal   Collection Time: 11/03/16  8:03 AM  Result Value Ref Range   Glucose-Capillary 130 (H) 65 - 99 mg/dL   Comment 1 Capillary Specimen    Comment 2 Notify RN   Glucose, capillary     Status: Abnormal   Collection Time: 11/03/16 11:50 AM  Result Value Ref Range   Glucose-Capillary 122 (H) 65 - 99 mg/dL   Comment 1 Capillary Specimen    Comment 2 Notify RN    No results found.     Medical Problem List and Plan: 1.  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 Patient has sternal precautions 2.  DVT Prophylaxis/Anticoagulation: Chronic Coumadin therapy for history of PAF as well as Lovenox for bridging. 3. Pain Management: Neurontin 300 mg twice a day, Ultram as  needed 4. Mood: Provide emotional support 5. Neuropsych: This patient is capable of making decisions on his own behalf. 6. Skin/Wound Care: Routine skin checks 7. Fluids/Electrolytes/Nutrition: Routine I&O with follow-up chemistries 8.AKI/CKD stage 3-4. Follow-up renal services. Presently receiving hemodialysis Monday Wednesday Friday schedule monitoring for recovery of renal function.S/P right IJ tunneled dialysis catheter/right brachiocephalic AV fistula 18/29/9371 9. Acute on chronic anemia. Follow-up CBC. Continue iron supplement 10. Dysphagia. Dysphagia #3 honey liquids. Monitor hydration. May need IV fluids for hydration pending labs 11. Chronic diastolic congestive heart failure. Monitor for any signs of fluid overload. Check daily weights 12. PAF. Chronic Coumadin, Lopressor 12.5 mg twice a day, amiodarone 200 mg every 12 hours 13. Diabetes mellitus and peripheral neuropathy. Hemoglobin A1c 7.2. Levemir 28 units twice a day. Check blood sugars before meals and at bedtime. 14. BPH. Proscar 5 mg daily, Flomax 0.4 mg daily 15. Hyperlipidemia. Lipitor 16. Hypothyroidism. Synthroid   Post Admission Physician Evaluation: 1. Preadmission assessment reviewed and changes made below. 2. Functional deficits secondary  to debility. 3. Patient is admitted to receive collaborative, interdisciplinary care between the physiatrist, rehab nursing staff, and therapy team. 4. Patient's level of medical complexity and substantial therapy needs in context of that medical necessity cannot be provided at a lesser intensity of care such as a SNF. 5. Patient has experienced substantial functional loss from his/her baseline which was documented above under the "Functional History" and "Functional Status" headings.  Judging by the patient's diagnosis, physical exam, and functional history, the patient has potential for functional progress which will result in measurable gains while on inpatient rehab.  These gains  will be of substantial and practical use upon discharge  in facilitating mobility and self-care at the household level. 6. Physiatrist will provide 24 hour management of medical needs as well as oversight of the therapy plan/treatment and provide guidance as  appropriate regarding the interaction of the two. 7. 24 hour rehab nursing will assist with safety, skin/wound care, disease management and patient education  and help integrate therapy concepts, techniques,education, etc. 8. PT will assess and treat for/with: Lower extremity strength, range of motion, stamina, balance, functional mobility, safety, adaptive techniques and equipment, woundcare, coping skills, pain control, education.   Goals are: Mod I/Supervision. 9. OT will assess and treat for/with: ADL's, functional mobility, safety, upper extremity strength, adaptive techniques and equipment, wound mgt, ego support, and community reintegration.   Goals are: Mod I/Supervision. Therapy may proceed with showering this patient. 10. SLP will assess and treat for/with: swallowing.  Goals are: Mod I. 11. Case Management and Social Worker will assess and treat for psychological issues and discharge planning. 12. Team conference will be held weekly to assess progress toward goals and to determine barriers to discharge. 13. Patient will receive at least 3 hours of therapy per day at least 5 days per week. 14. ELOS: 8-13 days. 15. Prognosis:  good   Delice Lesch, MD, ABPMR Lauraine Rinne J., PA-C 11/03/2016

## 2016-11-03 NOTE — Progress Notes (Signed)
Cristina Gong, RN Rehab Admission Coordinator Signed Physical Medicine and Rehabilitation  PMR Pre-admission Date of Service: 10/30/2016 3:14 PM  Related encounter: Admission (Current) from 10/09/2016 in [facility] ICU       [] Hide copied text PMR Admission Coordinator Pre-Admission Assessment  Patient: [name] is an 83 y.o., male MRN: [id number] DOB: 1930/12/21 Height: 5\' 8"  (172.7 cm) Weight: 92.6 kg (204 lb 2.3 oz)                                                                                                                                                  Insurance Information HMO:     PPO:      PCP:      IPA:      80/20: yes     OTHER: no HMO PRIMARY: Medicare a and b      Policy#: [id number]      Subscriber: pt Benefits:  Phone #: passport one online     Name: 10/30/16 Eff. Date: 10/13/97     Deduct: $1340      Out of Pocket Max: none      Life Max: none CIR: 100%      SNF: 20 full days Outpatient: 80%     Co-Pay: 20% Home Health: 100%      Co-Pay: none DME: 80%     Co-Pay: 20% Providers: pt choice  SECONDARY: [employer]      Policy#: [id number]      Subscriber: pt  Medicaid Application Date:       Case Manager:  Disability Application Date:       Case Worker:   Emergency [place]    Name Relation Home Work Mobile   Peak,Edreen Daughter   [phone]   Sahil, Milner Spouse [phone]  [phone]   Amilcar, Reever   [phone]   Hoyle Barr Daughter   [phone]     Current Medical History  Patient Admitting Diagnosis: debility  History of Present Illness: HPI: WAYLEN DEPAOLO a 83 y.o.right handed malewith history of hypertension, chronic diastolic congestive heart failure, stage IV chronic renal insufficiency, recurrent PAF on long-term anticoagulation, COPD with remote tobacco abuse, rheumatoid arthritis and diabetes mellitus. Presented 10/08/2016  with progressive shortness of breath. Noninvasive echocardiogram with ejection fraction of ~45% no signs of ischemia. Elective cardiac catheterization with noted CAD. Underwent CABG/MAZE procedure 10/09/2016 per Dr. Roxy Manns. Hospital course pain management.Sternal precautions as directed.Nephrology consulted for AKI on CKD. Patient has been receiving hemodialysis Monday Wednesday Friday schedule and monitoring for possible recovery of renal function withlatest creatinine 2.99 . Patient did undergo right IJ tunneled dialysis catheter 10/28/2016 per Dr. Scot Dock as well as right AV fistula. Chronic Coumadin therapy has been resumed. Acute on chronic anemia 10.2 and monitored.Marland KitchenMBS completed 10/29/2016 currently on a  dysphagia #3 honey thick liquid diet.  Past Medical History      Past Medical History:  Diagnosis Date  . Anemia   . Asthma   . Atrial fibrillation ([place]) 12/08/2013  . CHF (congestive heart failure) ([place])   . Chronic joint pain    "from RA" (10/01/2016)  . CKD (chronic kidney disease), stage III ([place])   . Constipation    takes stool softener daily  . COPD (chronic obstructive pulmonary disease) ([place])   . Coronary artery disease   . Coronary artery disease involving native coronary artery of native heart with angina pectoris ([place]) 09/30/2016  . Dysrhythmia    a-fib  . GERD (gastroesophageal reflux disease)    takes Zantac daily   . History of blood transfusion    "related to one of my ORs"  . Hyperlipidemia    takes Pravastatin daily  . Hypertension    takes Cardizem,Proscar,and Lisinopril daily  . Hypothyroidism   . Impaired hearing    wears hearing aids  . Joint swelling    "from RA" (10/01/2016)  . Peripheral neuropathy   . Pneumonia 2017  . Rheumatoid arthritis ([place])    "all over; take orencia  q 4 wks" (10/01/2016)  . S/P CABG x 2 10/09/2016   LIMA to LAD, SVG to OM, EVH via right thigh  . S/P Maze operation for atrial fibrillation  10/09/2016   Complete bilateral atrial lesion set using bipolar radiofrequency and cryothermy ablation with clipping of LA appendage  . Type II diabetes mellitus (HCC)     Family History  family history includes Aneurysm in his father; Cancer in his mother.  Prior Rehab/Hospitalizations:  Has the patient had major surgery during 100 days prior to admission? No  Current Medications   Current Facility-Administered Medications:  .  0.9 %  sodium chloride infusion, 250 mL, Intravenous, Continuous, Darylene Price H, MD .  0.9 %  sodium chloride infusion, 250 mL, Intravenous, PRN, Rexene Alberts, MD .  acetaminophen (TYLENOL) tablet 650 mg, 650 mg, Oral, Q6H PRN, Rexene Alberts, MD, 650 mg at 11/02/16 0012 .  amiodarone (PACERONE) tablet 200 mg, 200 mg, Oral, Q12H, Rexene Alberts, MD .  aspirin chewable tablet 81 mg, 81 mg, Per Tube, Daily, Einar Grad, RPH, 81 mg at 11/03/16 1194 .  atorvastatin (LIPITOR) tablet 40 mg, 40 mg, Oral, q1800, Rexene Alberts, MD, 40 mg at 11/02/16 1717 .  camphor-menthol (SARNA) lotion, , Topical, PRN, Donato Heinz, MD .  Chlorhexidine Gluconate Cloth 2 % PADS 6 each, 6 each, Topical, Q0600, Rexene Alberts, MD, 6 each at 11/03/16 1000 .  diphenhydrAMINE (BENADRYL) capsule 25 mg, 25 mg, Oral, Q6H PRN, Rexene Alberts, MD, 25 mg at 11/03/16 0950 .  enoxaparin (LOVENOX) injection 30 mg, 30 mg, Subcutaneous, Q48H, Rexene Alberts, MD, 30 mg at 11/03/16 0836 .  feeding supplement (GLUCERNA SHAKE) (GLUCERNA SHAKE) liquid 237 mL, 237 mL, Oral, BID BM, Rexene Alberts, MD, 237 mL at 11/02/16 1348 .  finasteride (PROSCAR) tablet 5 mg, 5 mg, Oral, QHS, Rexene Alberts, MD, 5 mg at 11/02/16 2121 .  gabapentin (NEURONTIN) capsule 300 mg, 300 mg, Oral, BID, Rexene Alberts, MD, 300 mg at 11/03/16 0837 .  Gerhardt's butt cream, , Topical, BID, Rexene Alberts, MD .  guaiFENesin (ROBITUSSIN) 100 MG/5ML solution 400 mg, 20 mL, Oral, Q4H PRN,  Rexene Alberts, MD, 400 mg at 11/03/16 0344 .  hydrocortisone cream 0.5 %, ,  Topical, BID, Rexene Alberts, MD .  insulin aspart (novoLOG) injection 0-15 Units, 0-15 Units, Subcutaneous, TID WC, Rexene Alberts, MD, 2 Units at 11/03/16 [phone] .  insulin detemir (LEVEMIR) injection 28 Units, 28 Units, Subcutaneous, Daily, Rexene Alberts, MD, 28 Units at 11/03/16 [phone] .  levothyroxine (SYNTHROID, LEVOTHROID) tablet 25 mcg, 25 mcg, Per Tube, QAC breakfast, Einar Grad, RPH, 25 mcg at 11/03/16 5176 .  MEDLINE mouth rinse, 15 mL, Mouth Rinse, BID, Rexene Alberts, MD, 15 mL at 11/03/16 1000 .  metoprolol tartrate (LOPRESSOR) tablet 12.5 mg, 12.5 mg, Oral, BID, Rexene Alberts, MD, 12.5 mg at 11/03/16 0837 .  moving right along book, , Does not apply, Once, Rexene Alberts, MD .  multivitamin (RENA-VIT) tablet 1 tablet, 1 tablet, Oral, QHS, Rexene Alberts, MD, 1 tablet at 11/02/16 2121 .  ondansetron (ZOFRAN) injection 4 mg, 4 mg, Intravenous, Q6H PRN, Rexene Alberts, MD, 4 mg at 10/31/16 2109 .  pantoprazole (PROTONIX) EC tablet 40 mg, 40 mg, Oral, Daily, Rexene Alberts, MD, 40 mg at 11/03/16 0837 .  patient's guide to using coumadin book, , Does not apply, Once, Lyndee Leo, [facility] .  RESOURCE THICKENUP CLEAR, , Oral, PRN, Rexene Alberts, MD .  sodium chloride flush (NS) 0.9 % injection 3 mL, 3 mL, Intravenous, Q12H, Rexene Alberts, MD, 3 mL at 11/02/16 2200 .  sodium chloride flush (NS) 0.9 % injection 3 mL, 3 mL, Intravenous, PRN, Rexene Alberts, MD, 3 mL at 10/13/16 1100 .  tamsulosin (FLOMAX) capsule 0.4 mg, 0.4 mg, Oral, QHS, Rexene Alberts, MD, 0.4 mg at 11/02/16 2122 .  traMADol (ULTRAM) tablet 50 mg, 50 mg, Oral, Q12H PRN, Rexene Alberts, MD, 50 mg at 11/02/16 1201 .  traZODone (DESYREL) tablet 25 mg, 25 mg, Oral, QHS PRN, [place] Gum, Collier Salina, MD .  warfarin (COUMADIN) tablet 5 mg, 5 mg, Oral, q1800, Rexene Alberts, MD, 5 mg at 11/02/16 1717 .  Warfarin -  Physician Dosing Inpatient, , Does not apply, q1800, Rexene Alberts, MD  Patients Current Diet: DIET DYS 3 Room service appropriate? 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 None of the above  Prior Functional Level Prior Function Level of Independence: Independent with assistive device(s) Comments: Pt used rollator or cane PTA.  Has bad ankles per family  Self Care: Did the patient need help bathing, dressing, using the toilet or eating?  Independent  Indoor Mobility: Did the patient need assistance with walking from room to room (with or without device)? Independent  Stairs: Did the patient need assistance with internal or external  stairs (with or without device)? Independent  Functional Cognition: Did the patient need help planning regular tasks such as shopping or remembering to take medications? Independent  Current Functional Level Cognition  Overall Cognitive Status: Impaired/Different from baseline Difficult to assess due to:  Tracheostomy (with vent) Current Attention Level: Selective Orientation Level: Oriented X4 Following Commands: Follows multi-step commands consistently Safety/Judgement: Decreased awareness of safety, Decreased awareness of deficits General Comments: demonstrating adherence to sternal precautions    Extremity Assessment (includes Sensation/Coordination)  Upper Extremity Assessment: Generalized weakness  Lower Extremity Assessment: Generalized weakness    ADLs  Overall ADL's : Needs assistance/impaired Eating/Feeding: Sitting, Minimal assistance Grooming: Minimal assistance, Sitting Upper Body Bathing: Sitting, Moderate assistance Lower Body Bathing: Maximal assistance, Sit to/from stand, +2 for safety/equipment Upper Body Dressing : Sitting, Moderate assistance Lower Body Dressing: Maximal assistance, Sit to/from stand, +2 for safety/equipment Toilet Transfer: Moderate assistance, +2 for physical assistance, +2 for safety/equipment, Ambulation, BSC (+3 for chair follow; with [place] walker; cues for safety) Toileting- Clothing Manipulation and Hygiene: Total assistance, Sit to/from stand Functional mobility during ADLs: Moderate assistance (Eva walker) General ADL Comments: Cueing throughout for safety and encouragement. Pt much more motivated after he saw what he could accomplish.     Mobility  Overal bed mobility: Needs Assistance Bed Mobility: Sit to Supine Sit to supine: Max assist, +2 for physical assistance General bed mobility comments: required maxA for LE management into bed, pt with increased fatigue after ambulation    Transfers  Overall transfer level: Needs assistance Equipment used:  (EVA walker) Transfers: Sit to/from Stand Sit to Stand: Mod assist, +2 physical assistance General transfer comment: ModAx2 for power up varying but even with min assist for power up mod cues needed for safety each time to assist with lift.  Assist and cues for steadying and reaching  to EVA walker.      Ambulation / Gait / Stairs / Wheelchair Mobility  Ambulation/Gait Ambulation/Gait assistance: +2 physical assistance, Min assist Ambulation Distance (Feet): 200 Feet Assistive device:  (EVA walker) Gait Pattern/deviations: Step-through pattern, Decreased step length - right, Decreased step length - left, Trunk flexed, Antalgic, Narrow base of support, Drifts right/left, Shuffle General Gait Details: mod to min A for steadying with gait with mod cues to sequence steps and eva walker, vc for encouragement, anterior pelvic tilt, and looking up and out with gait.  Pt also followed with chair and took 1 seated rest break.   Gait velocity: slowed Gait velocity interpretation: Below normal speed for age/gender    Posture / Balance Balance Overall balance assessment: Needs assistance Sitting-balance support: Feet supported, No upper extremity supported Sitting balance-Leahy Scale: Fair Standing balance support: Bilateral upper extremity supported, During functional activity Standing balance-Leahy Scale: Poor Standing balance comment: relies on UE support.     Special needs/care consideration BiPAP/CPAP  N/a CPM  M/a Continuous Drip IV  N/a Dialysis new ESRD requiring Hemodialysis this admission; clipping pending Life Vest  N/a Oxygen  N/a Special Bed  N/a Trach Size  N/a Wound Vac n/a Skin  Right IJ tunneled hemodialysis catheter placed 10/30/15 as well as Right ARM AV fistula; ecchymosis to bilateral area of abdomen as well as BUE; skin tear to back right side; moisture associated skin damage to perineum and sacrum; surgical incisions. Back red rash Bowel mgmt:incontinent LBM 11/02/16. 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 Daughter, Cheri Kearns, is main contact  Discharge Living Setting Plans for Discharge Living Setting: Patient's home, Lives with (comment) (spouse who has memory issues) Type of Home at Discharge: House Discharge Home Layout: One level Discharge Home Access: [place] entrance Discharge Bathroom Shower/Tub: Walk-in shower Discharge Bathroom Toilet: Standard Discharge Bathroom Accessibility: Yes How Accessible: Accessible via walker Does the patient have any problems obtaining your medications?: No  Social/Family/Support Systems Patient Roles: Spouse, Parent (buisness owner) Contact Information: Cheri Kearns, daughter is main contact Anticipated Caregiver: 5 children Anticipated Caregiver's Contact Information: [phone] Ability/Limitations of Caregiver: children will rotate assistance Caregiver Availability: 24/7 Discharge Plan Discussed with Primary Caregiver: Yes Is Caregiver In Agreement with Plan?: Yes Does Caregiver/Family have Issues with Lodging/Transportation while Pt is in Rehab?: No  Goals/Additional Needs Patient/Family Goal for Rehab: superviison to min assist with PT, OT, and SLP Expected length of stay: ELOS 18- 22 days Special Service Needs: New ESRD this admission Additional Information: I have told family that wife can stay overnight occasionally with pt if another child stays also and is her responsible party Pt/Family Agrees to Admission and willing to participate: Yes Program Orientation Provided & Reviewed with Pt/Caregiver Including Roles  & Responsibilities: Yes   I spoke with Cheri Kearns,  daughter, that wife needs to stay at home  For family report that pt wants her with him all the time at night and she will not get rest and with her memory issues will get her days and nights mixed up.  Decrease burden of Care through IP rehab admission: n/a  Possible need for SNF placement upon discharge:patient originally requested [facility] in [place] to be near his wife at home and to be convenient for the family. I discussed with pt and his son, Heath Lark on 10/30/16 the benefits of CIR admit at the close medical supervision and coordinated Team efforts.  Patient Condition: This patient's medical and functional status has changed since the consult dated: 10/27/2016 in which the Rehabilitation Physician determined and documented that the patient's condition is appropriate for intensive rehabilitative care in an inpatient rehabilitation facility. See "History of Present Illness" (above) for medical update. Functional changes are: mod assist. Patient's medical and functional status update has been discussed with the Rehabilitation physician and patient remains appropriate for inpatient rehabilitation. Will admit to inpatient rehab today.  Preadmission Screen Completed By:  Cleatrice Burke, 11/03/2016 12:16 PM ______________________________________________________________________   Discussed status with Dr. Posey Pronto on 11/03/2016 at  1216 and received telephone approval for admission today.  Admission Coordinator:  Cleatrice Burke, time 9629 Date 11/03/2016       Cosigned by: Jamse Arn, MD at 11/03/2016 12:31 PM  Revision History

## 2016-11-03 NOTE — Progress Notes (Addendum)
ANTICOAGULATION CONSULT NOTE - Initial Consult  Pharmacy Consult for Warfarin Indication: atrial fibrillation  No Known Allergies  Patient Measurements:    Vital Signs: Temp: 97.5 F (36.4 C) (10/22 1709) Temp Source: Oral (10/22 1152) BP: 88/49 (10/22 1709) Pulse Rate: 75 (10/22 1709)  Labs:  Recent Labs  11/01/16 0307 11/02/16 0426 11/03/16 0211  HGB 9.1*  --   --   HCT 28.4*  --   --   PLT 223  --   --   LABPROT 15.2 16.5* 19.3*  INR 1.21 1.35 1.65  CREATININE 5.85*  --   --     Estimated Creatinine Clearance: 10.4 mL/min (A) (by C-G formula based on SCr of 5.85 mg/dL (H)).   Assessment: 67 YOM transferred to CIR on 10/22 after a prolonged hospital stay that included CABG/MAZE with AKI and new ESRD. The patient has a hx of Afib and was switched from Apixaban to Warfarin during the last admission - was being dosed by the MD after CVTS surgery, now to be dosed by pharmacy in rehab.  INR today is still SUBtherapeutic at 1.65 however trending up nicely on a 5 mg/day dose. Will continue with this dose this evening.   The patient appears to also be on Lovenox q48h for VTE px while awaiting therapeutic INR. This dosing is odd - should consider adjusting to 30 mg q24h.   Goal of Therapy:  INR 2-3 Monitor platelets by anticoagulation protocol: Yes   Plan:  1. Warfarin 5 mg x 1 dose at 1800 today 2. Will f/u d/c of Lovenox when INR>/= 2 3. Will continue to monitor for any signs/symptoms of bleeding and will follow up with PT/INR in the a.m.   Thank you for allowing pharmacy to be a part of this patient's care.  Alycia Rossetti, PharmD, BCPS Clinical Pharmacist Pager: [phone] If after 3:30p, please call main pharmacy at: [phone] 11/03/2016 5:42 PM

## 2016-11-03 NOTE — Progress Notes (Signed)
I met with pt at bedside and then contacted his daughter, Cheri Kearns, by phone. Both are in agreement to admit to inpt rehab today. Pt has received benadryl this morning due to back rash and lack of sleep last night. Currently sleepy but did agree to get up with PT and OT to chair and worked with them this morning. Pt previously had walked with Nursing on EVA walker in the hallway and tolerated well. I will make the arrangements tot admit today. RN CM aware. [phone]

## 2016-11-03 NOTE — Progress Notes (Signed)
Physical Therapy Treatment Patient Details Name: YAHYE SIEBERT MRN: [id number] DOB: April 28, 1930 Today's Date: 11/03/2016    History of Present Illness Pt admit for CABGx2 and MAZE procedure on 9/27.  He developed worsening renal failure post operatively requiring CRRT, Intubation 9/27-10/8.   On 10/3 he developed shock in the setting of a spontaneous R sided pneumothorax.  PMHx: hypertension, chronic diastolic congestive heart failure, stage IV chronic kidney disease, hyperlipidemia, recurrent PAF on long-term anticoagulation, bronchiectasis with chronic dyspnea on exertion, COPD, asthma, RA, DM, and anemia     PT Comments    Pt admitted with above diagnosis. Pt currently with functional limitations due to balance and endurance deficits. Pt was able to stand and pivot to chair with +2 mod assist.  Pt lethargic as nurse had to give benadryl for a rash.  Pt willing to get up to chair and did a little exercise as well.  Should go to rehab today.  Pt will benefit from skilled PT to increase their independence and safety with mobility to allow discharge to the venue listed below.     Follow Up Recommendations  Supervision/Assistance - 24 hour;CIR     Equipment Recommendations  Other (comment) (TBA)    Recommendations for Other Services       Precautions / Restrictions Precautions Precautions: Fall;Sternal Restrictions Weight Bearing Restrictions:  (sternal precautions)    Mobility  Bed Mobility Overal bed mobility: Needs Assistance Bed Mobility: Sit to Supine       Sit to supine: Max assist;+2 for physical assistance   General bed mobility comments: Max assist to raise trunk from [facility].   Transfers Overall transfer level: Needs assistance Equipment used: 2 person hand held assist Transfers: Sit to/from [facility] Sit to Stand: +2 physical assistance;Mod assist Stand pivot transfers: Min assist;+2 physical assistance       General transfer comment: Mod assist  +2 to power up. Min assist +2 once standing for pivot to chair. Only able to pivot as pt lethargic from Medication.  Ambulation/Gait                 Stairs            Wheelchair Mobility    Modified Rankin (Stroke Patients Only)       Balance Overall balance assessment: Needs assistance Sitting-balance support: Feet supported;No upper extremity supported Sitting balance-Leahy Scale: Fair     Standing balance support: Bilateral upper extremity supported;During functional activity Standing balance-Leahy Scale: Poor Standing balance comment: relies on UE support.                             Cognition Arousal/Alertness: Lethargic;Suspect due to medications (pt had just received Benadryl) Behavior During Therapy: Flat affect Overall Cognitive Status: Impaired/Different from baseline Area of Impairment: Attention;Memory;Following commands                   Current Attention Level: Selective Memory: Decreased recall of precautions Following Commands: Follows multi-step commands inconsistently Safety/Judgement: Decreased awareness of safety;Decreased awareness of deficits     General Comments: Pt lethargic throughout session but able to participate.       Exercises General Exercises - Upper Extremity Elbow Flexion: AROM;Both;10 reps;Seated Elbow Extension: Both;10 reps;Supine;AROM General Exercises - Lower Extremity Ankle Circles/Pumps: AROM;Both;10 reps;Supine Long Arc Quad: Both;Seated;AROM;10 reps Hip Flexion/Marching: Both;Seated;AROM;5 reps Other Exercises Other Exercises: Facilitated improved functional use of B UE with forward punches within parameters of sternal precautions.  General Comments General comments (skin integrity, edema, etc.): VSS      Pertinent Vitals/Pain Pain Assessment: Faces Faces Pain Scale: Hurts little more Pain Location: sternum Pain Descriptors / Indicators: Discomfort Pain Intervention(s): Limited  activity within patient's tolerance;Monitored during session;Repositioned    Home Living                      Prior Function            PT Goals (current goals can now be found in the care plan section) Acute Rehab PT Goals Patient Stated Goal: to get better Progress towards PT goals: Progressing toward goals    Frequency    Min 3X/week      PT Plan Current plan remains appropriate    Co-evaluation PT/OT/SLP Co-Evaluation/Treatment: Yes Reason for Co-Treatment: For patient/therapist safety PT goals addressed during session: Mobility/safety with mobility OT goals addressed during session: ADL's and self-care      AM-PAC PT "6 Clicks" Daily Activity  Outcome Measure  Difficulty turning over in bed (including adjusting bedclothes, sheets and blankets)?: Unable Difficulty moving from lying on back to sitting on the side of the bed? : Unable Difficulty sitting down on and standing up from a chair with arms (e.g., wheelchair, bedside commode, etc,.)?: A Little Help needed moving to and from a bed to chair (including a wheelchair)?: A Lot Help needed walking in hospital room?: Total Help needed climbing 3-5 steps with a railing? : Total 6 Click Score: 9    End of Session Equipment Utilized During Treatment: Gait belt Activity Tolerance: Patient limited by fatigue Patient left: with call bell/phone within reach;with chair alarm set;in chair Nurse Communication: Mobility status;Precautions PT Visit Diagnosis: Muscle weakness (generalized) (M62.81);Other abnormalities of gait and mobility (R26.89) Pain - part of body:  (chest)     Time: 3299-2426 PT Time Calculation (min) (ACUTE ONLY): 23 min  Charges:  $Therapeutic Activity: 8-22 mins                    G Codes:       Lindee Leason,PT Acute Rehabilitation [phone] (pager)    Denice Paradise 11/03/2016, 3:23 PM

## 2016-11-03 NOTE — Progress Notes (Signed)
CARDIAC REHAB PHASE I   Pt sitting in chair, sleepy from benadryl, wife and son at bedside, awaiting discharge to CIR. Briefly reviewed cardiac surgery discharge education. Reviewed IS, sternal precautions, s/s heart failure, and phase 2 cardiac rehab. Pt new to dialysis, will defer diet education to dialysis. Pt verbalized understanding, agrees to phase 2 cardiac rehab referral, will send to [place] per pt request. Pt in bed, call bell within reach.   [place], RN, BSN 11/03/2016 2:30 PM

## 2016-11-03 NOTE — Progress Notes (Signed)
Occupational Therapy Treatment Patient Details Name: [name] MRN: [id number] DOB: 1930/02/19 Today's Date: 11/03/2016    History of present illness Pt admit for CABGx2 and MAZE procedure on 9/27.  He developed worsening renal failure post operatively requiring CRRT, Intubation 9/27-10/8.   On 10/3 he developed shock in the setting of a spontaneous R sided pneumothorax.  PMHx: hypertension, chronic diastolic congestive heart failure, stage IV chronic kidney disease, hyperlipidemia, recurrent PAF on long-term anticoagulation, bronchiectasis with chronic dyspnea on exertion, COPD, asthma, RA, DM, and anemia    OT comments  Pt demonstrating progress toward OT goals. He was lethargic this session from Benadryl but remains motivated to return to PLOF. Pt demonstrated ability to complete simulated stan-pivot toilet transfer with mod assist +2 for power up and min assist +2 for pivot. Facilitated improved independence with seated grooming tasks and pt was able to complete with supervision this session. Additionally facilitated improved B UE strength in preparation for ADL participation and pt demonstrated good tolerance. OT will continue to follow while admitted. Continue to recommend CIR post-acute D/C.   Follow Up Recommendations  CIR;Supervision/Assistance - 24 hour    Equipment Recommendations  Other (comment) (TBD at next venue of care)    Recommendations for Other Services Rehab consult    Precautions / Restrictions Precautions Precautions: Fall;Sternal Restrictions Weight Bearing Restrictions:  (sternal precautions)       Mobility Bed Mobility Overal bed mobility: Needs Assistance Bed Mobility: Sit to Supine       Sit to supine: Max assist;+2 for physical assistance   General bed mobility comments: Max assist to raise trunk from [facility].   Transfers Overall transfer level: Needs assistance Equipment used: 2 person hand held assist Transfers: Sit to/from [employer] Sit to Stand: +2 physical assistance;Mod assist Stand pivot transfers: Min assist;+2 physical assistance       General transfer comment: Mod assist +2 to power up. Min assist +2 once standing for pivot to chair.     Balance Overall balance assessment: Needs assistance Sitting-balance support: Feet supported;No upper extremity supported Sitting balance-Leahy Scale: Fair     Standing balance support: Bilateral upper extremity supported;During functional activity Standing balance-Leahy Scale: Poor Standing balance comment: relies on UE support.                            ADL either performed or assessed with clinical judgement   ADL Overall ADL's : Needs assistance/impaired     Grooming: Supervision/safety;Sitting                   Toilet Transfer: Minimal assistance;+2 for safety/equipment;Stand-pivot           Functional mobility during ADLs: Minimal assistance;+2 for safety/equipment General ADL Comments: Pt completing grooming task with supervision seated in chair this session. Pt lethargic but remains motivated to return to PLOF. Singing throughout session.      Vision       Perception     Praxis      Cognition Arousal/Alertness: Lethargic;Suspect due to medications (pt had just received Benadryl) Behavior During Therapy: Flat affect Overall Cognitive Status: Impaired/Different from baseline Area of Impairment: Attention;Memory;Following commands                   Current Attention Level: Selective Memory: Decreased recall of precautions Following Commands: Follows multi-step commands inconsistently Safety/Judgement: Decreased awareness of safety;Decreased awareness of deficits     General Comments: Pt  lethargic throughout session but able to participate.         Exercises Exercises: General Upper Extremity;Other exercises General Exercises - Upper Extremity Elbow Flexion: AROM;Both;10 reps;Seated Elbow Extension:  Both;10 reps;Supine;AROM Other Exercises Other Exercises: Facilitated improved functional use of B UE with forward punches within parameters of sternal precautions.    Shoulder Instructions       General Comments VSS    Pertinent Vitals/ Pain       Pain Assessment: Faces Faces Pain Scale: Hurts little more Pain Location: sternum Pain Descriptors / Indicators: Discomfort Pain Intervention(s): Limited activity within patient's tolerance;Monitored during session;Repositioned  Home Living                                          Prior Functioning/Environment              Frequency  Min 3X/week        Progress Toward Goals  OT Goals(current goals can now be found in the care plan section)  Progress towards OT goals: Progressing toward goals  Acute Rehab OT Goals Patient Stated Goal: to get better OT Goal Formulation: With patient Time For Goal Achievement: 11/12/16 Potential to Achieve Goals: Good  Plan Discharge plan remains appropriate    Co-evaluation    PT/OT/SLP Co-Evaluation/Treatment: Yes Reason for Co-Treatment: For patient/therapist safety;Complexity of the patient's impairments (multi-system involvement)   OT goals addressed during session: ADL's and self-care      AM-PAC PT "6 Clicks" Daily Activity     Outcome Measure   Help from another person eating meals?: A Little Help from another person taking care of personal grooming?: A Little Help from another person toileting, which includes using toliet, bedpan, or urinal?: A Lot Help from another person bathing (including washing, rinsing, drying)?: A Lot Help from another person to put on and taking off regular upper body clothing?: A Lot Help from another person to put on and taking off regular lower body clothing?: A Lot 6 Click Score: 14    End of Session Equipment Utilized During Treatment: Gait belt  OT Visit Diagnosis: Other abnormalities of gait and mobility  (R26.89);Muscle weakness (generalized) (M62.81);Pain Pain - Right/Left:  (incision) Pain - part of body:  (incision)   Activity Tolerance Patient tolerated treatment well   Patient Left in bed;with call bell/phone within reach;with nursing/sitter in room   Nurse Communication Mobility status        Time: 5638-9373 OT Time Calculation (min): 23 min  Charges: OT General Charges $OT Visit: 1 Visit OT Treatments $Self Care/Home Management : 8-22 mins  Norman Herrlich, MS OTR/L  Pager: [place] A Dianah Pruett 11/03/2016, 3:07 PM

## 2016-11-03 NOTE — Progress Notes (Signed)
Pt admitted to 4M04 with wife and daughter at bedside. Pt sleepy but arousable. Pt complaining of mild shortness of breath with no chest pain. Pt O2 sats were stable at 97. Robitussin given for wet cough. MD on call notified and chest xray ordered. Will continue to monitor.

## 2016-11-03 NOTE — Progress Notes (Signed)
DC orders and DC summary written by Lars Pinks, PA. Waiting for room number on in patient rehab to transfer patient.  Rowe Pavy, RN

## 2016-11-03 NOTE — Progress Notes (Signed)
NCM received message from [place] with CIR that they are admitting patient to CIR today.

## 2016-11-03 NOTE — Plan of Care (Signed)
Problem: Cardiac: Goal: Hemodynamic stability will improve Outcome: Progressing Within parameters Goal: Ability to maintain an adequate cardiac output will improve Outcome: Progressing Within parameters

## 2016-11-03 NOTE — Progress Notes (Signed)
[] Hide copied text [] Hover for attribution information      Physical Medicine and Rehabilitation Consult Reason for Consult:  Decreased functional mobility Referring Physician: Dr. Roxy Manns   HPI: [name] is a 83 y.o.right handed male with history of hypertension, chronic diastolic congestive heart failure, stage IV chronic renal insufficiency, recurrent PAF on long-term anticoagulation, COPD with remote tobacco abuse, rheumatoid arthritis and diabetes mellitus. Per chart review and patient, patient lives with spouse. Independent prior to admission. One level home with ramp entrance. Presented 10/08/2016 with progressive shortness of breath. Noninvasive echocardiogram with ejection fraction of ~45% no signs of ischemia. Elective cardiac catheterization with noted CAD. Underwent CABG/MAZE procedure 10/09/2016 per Dr. Roxy Manns. Hospital course pain management. Nephrology consulted for AKI on CKD. Patient has received intermittent dialysis scheduled again for 10/27/2016 and latest creatinine 3.87. Chronic Coumadin therapy has been resumed. Acute on chronic anemia 7.3 and monitored with transfusion as directed, receiving during encounter. Physical occupational therapy ongoing with slow progressive gains. M.D. Has requested physical medicine rehabilitation consult.   Review of Systems  Constitutional: Negative for chills and fever.  HENT: Negative for hearing loss.   Eyes: Negative for blurred vision and double vision.  Respiratory: Positive for cough and shortness of breath.   Cardiovascular: Positive for chest pain and palpitations.  Gastrointestinal: Positive for constipation. Negative for nausea and vomiting.       GERD  Genitourinary: Negative for flank pain and hematuria.  Musculoskeletal: Positive for joint pain and myalgias.  Skin: Negative for rash.  Neurological: Positive for weakness. Negative for seizures.  All other systems reviewed and are negative.      Past Medical  History:  Diagnosis Date  . Anemia   . Asthma   . Atrial fibrillation ([place]) 12/08/2013  . CHF (congestive heart failure) ([place])   . Chronic joint pain    "from RA" (10/01/2016)  . CKD (chronic kidney disease), stage III   . Constipation    takes stool softener daily  . COPD (chronic obstructive pulmonary disease) ([place])   . Coronary artery disease   . Coronary artery disease involving native coronary artery of native heart with angina pectoris ([place]) 09/30/2016  . Dysrhythmia    a-fib  . GERD (gastroesophageal reflux disease)    takes Zantac daily   . History of blood transfusion    "related to one of my ORs"  . Hyperlipidemia    takes Pravastatin daily  . Hypertension    takes Cardizem,Proscar,and Lisinopril daily  . Hypothyroidism   . Impaired hearing    wears hearing aids  . Joint swelling    "from RA" (10/01/2016)  . Peripheral neuropathy   . Pneumonia 2017  . Rheumatoid arthritis ([place])    "all over; take orencia  q 4 wks" (10/01/2016)  . S/P CABG x 2 10/09/2016   LIMA to LAD, SVG to OM, EVH via right thigh  . S/P Maze operation for atrial fibrillation 10/09/2016   Complete bilateral atrial lesion set using bipolar radiofrequency and cryothermy ablation with clipping of LA appendage  . Type II diabetes mellitus ([place])         Past Surgical History:  Procedure Laterality Date  . AMPUTATION TOE Left 11/08/2015   Procedure: LEFT HALLUX AMPUTATION AT THE METATARSOPHALANGEAL JOINT;  Surgeon: Wylene Simmer, MD;  Location: [facility];  Service: Orthopedics;  Laterality: Left;  . ANKLE FUSION Left 2014  . BIOPSY THYROID  2014  . CARDIAC CATHETERIZATION  09/30/2016  . CARPAL TUNNEL  RELEASE Bilateral 1990's  . CATARACT EXTRACTION W/ INTRAOCULAR LENS  IMPLANT, BILATERAL Bilateral 1990's  . CLIPPING OF ATRIAL APPENDAGE  10/09/2016   Procedure: CLIPPING OF ATRIAL APPENDAGE;  Surgeon: Rexene Alberts, MD;  Location: [facility];  Service: Open  Heart Surgery;;  . COLONOSCOPY    . CORONARY ARTERY BYPASS GRAFT N/A 10/09/2016   Procedure: CORONARY ARTERY BYPASS GRAFTING (CABG) x two, using left internal mammary artery and right leg greater saphenous vein harvested endoscopically;  Surgeon: Rexene Alberts, MD;  Location: [facility];  Service: Open Heart Surgery;  Laterality: N/A;  . FOREIGN BODY REMOVAL Right 09/06/2013   Procedure: REMOVAL FOREIGN BODY RIGHT INDEX FINGER;  Surgeon: Daryll Brod, MD;  Location: [facility];  Service: Orthopedics;  Laterality: Right;  ANESTHESIA: IV REGIONAL FAB  . JOINT REPLACEMENT    . MAZE N/A 10/09/2016   Procedure: MAZE;  Surgeon: Rexene Alberts, MD;  Location: [place];  Service: Open Heart Surgery;  Laterality: N/A;  . REPAIR TENDONS FOOT Left 1946; 1984   "farming accident; repaired tendons both times"  . RIGHT/LEFT HEART CATH AND CORONARY ANGIOGRAPHY N/A 09/30/2016   Procedure: RIGHT/LEFT HEART CATH AND CORONARY ANGIOGRAPHY;  Surgeon: Nigel Mormon, MD;  Location: [facility];  Service: Cardiovascular;  Laterality: N/A;  . SHOULDER ARTHROSCOPY W/ ROTATOR CUFF REPAIR Right 1990's  . TEE WITHOUT CARDIOVERSION N/A 10/09/2016   Procedure: TRANSESOPHAGEAL ECHOCARDIOGRAM (TEE);  Surgeon: Rexene Alberts, MD;  Location: [facility];  Service: Open Heart Surgery;  Laterality: N/A;  . THYROIDECTOMY Right 02/01/2013   Procedure: RIGHT THYROIDECTOMY LOBECTOMY;  Surgeon: Earnstine Regal, MD;  Location: [facility];  Service: General;  Laterality: Right;  . TONSILLECTOMY  1930's  . TOTAL KNEE ARTHROPLASTY Right 12/16/2010   Procedure: TOTAL KNEE ARTHROPLASTY; rt Surgeon: Rudean Haskell, MD;  Location: [facility];  Service: Orthopedics;  Laterality: Right;  . TOTAL SHOULDER REPLACEMENT Left 03/2010        Family History  Problem Relation Age of Onset  . Cancer Mother        breast  . Aneurysm Father    Social History:  reports that he quit smoking about 51 years ago. His smoking use  included Cigarettes. He has a 15.00 pack-year smoking history. He has never used smokeless tobacco. He reports that he does not drink alcohol or use drugs. Allergies: No Known Allergies       Medications Prior to Admission  Medication Sig Dispense Refill  . Abatacept (ORENCIA IV) Inject into the vein every 30 (thirty) days. Infusion due again October 2018    . albuterol (PROVENTIL) (2.5 MG/3ML) 0.083% nebulizer solution Take 3 mLs (2.5 mg total) by nebulization every 6 (six) hours as needed for wheezing or shortness of breath. 360 mL 11  . aspirin 81 MG chewable tablet Chew 1 tablet (81 mg total) by mouth daily.    [name] atorvastatin (LIPITOR) 40 MG tablet Take 40 mg by mouth daily.    . ferrous sulfate 325 (65 FE) MG tablet Take 325 mg by mouth daily with breakfast.     . finasteride (PROSCAR) 5 MG tablet Take 5 mg by mouth at bedtime.     . fluticasone furoate-vilanterol (BREO ELLIPTA) 100-25 MCG/INH AEPB Inhale 1 puff into the lungs daily. 60 each 5  . folic acid (FOLVITE) 1 MG tablet Take 1 mg by mouth daily.      . furosemide (LASIX) 80 MG tablet Take 80 mg by mouth 2 (  two) times daily.    [name] gabapentin (NEURONTIN) 300 MG capsule Take 1 capsule (300 mg total) by mouth 2 (two) times daily. 15 capsule 0  . guaiFENesin (MUCINEX) 600 MG 12 hr tablet Take 1,200 mg by mouth 2 (two) times daily.     [name] HYDROcodone-acetaminophen (NORCO) 5-325 MG per tablet Take 1 tablet by mouth every 6 (six) hours as needed for moderate pain. (Patient taking differently: Take 1 tablet by mouth every 6 (six) hours as needed for moderate pain. [facility]) 30 tablet 0  . Insulin Glargine (TOUJEO SOLOSTAR) 300 UNIT/ML SOPN Inject 14 Units into the skin at bedtime.     . isosorbide-hydrALAZINE (BIDIL) 20-37.5 MG tablet Take 1 tablet by mouth 3 (three) times daily.    [name] leflunomide (ARAVA) 20 MG tablet Take 20 mg by mouth daily.     [name] levothyroxine (SYNTHROID, LEVOTHROID) 25 MCG tablet Take  25 mcg by mouth daily before breakfast.    . metoprolol (LOPRESSOR) 50 MG tablet Take 25 mg by mouth 2 (two) times daily.     . ranitidine (ZANTAC) 150 MG tablet Take 150 mg by mouth 2 (two) times daily.      . Tamsulosin HCl (FLOMAX) 0.4 MG CAPS Take 0.4 mg by mouth at bedtime.     . traZODone (DESYREL) 50 MG tablet Take 50 mg by mouth at bedtime as needed for sleep.      Home: Home Living Family/patient expects to be discharged to:: Private residence Living Arrangements: Spouse/significant other Available Help at Discharge: Family Type of Home: House Home Access: [place]: One level Bathroom Shower/Tub: Multimedia programmer: Standard Bathroom Accessibility: Yes Home Equipment: Toilet riser, Grab bars - tub/shower, [place] in, [employer] - toilet, Cane - single point, Environmental consultant - 4 wheels   Prior Function Level of Independence: Independent with assistive device(s) Comments: Pt used rollator or cane PTA.  Has bad ankles per family Functional Status:  Mobility: Bed Mobility General bed mobility comments: OOB in chair on arrival Transfers Overall transfer level: Needs assistance Equipment used:  (EVA walker) Transfers: Sit to/from Stand Sit to Stand: Mod assist, +2 physical assistance General transfer comment: modAx2 for power up, steadying and reaching to EVA walker, 3x sit<>stand Ambulation/Gait Ambulation/Gait assistance: Mod assist Ambulation Distance (Feet): 8 Feet (2x4 feet) Assistive device:  (EVA walker) Gait Pattern/deviations: Step-through pattern, Decreased step length - right, Decreased step length - left, Trunk flexed, Antalgic, Narrow base of support General Gait Details: modA for steadying with gait, vc for encouragement, anterior pelvic tilt, and looking up and out with gait Gait velocity: slowed Gait velocity interpretation: Below normal speed for age/gender  ADL:  Cognition: Cognition Overall Cognitive  Status: Impaired/Different from baseline Orientation Level: Oriented X4 Cognition Arousal/Alertness: Awake/alert Behavior During Therapy: Flat affect Overall Cognitive Status: Impaired/Different from baseline Area of Impairment: Orientation, Attention, Memory, Following commands Orientation Level: Disoriented to, Time Current Attention Level: Alternating Memory: Decreased recall of precautions, Decreased short-term memory Following Commands: Follows multi-step commands consistently Safety/Judgement: Decreased awareness of safety, Decreased awareness of deficits Problem Solving: Slow processing, Requires verbal cues, Requires tactile cues General Comments: pt able to recall 2/3 sternal precautions at end of session today Difficult to assess due to: Tracheostomy (with vent)  Blood pressure (!) 107/47, pulse 84, temperature 97.9 F (36.6 C), resp. rate 15, height 5\' 8"  (1.727 m), weight 93.4 kg (205 lb 14.6 oz), SpO2 98 %. Physical Exam  Vitals reviewed. Constitutional:  He appears well-developed.  Frail  HENT:  Head: Normocephalic and atraumatic.  Nasogastric tube in place  Eyes: EOM are normal. Right eye exhibits no discharge. Left eye exhibits no discharge.  Neck: Normal range of motion. Neck supple. No thyromegaly present.  Cardiovascular: Normal rate and regular rhythm.   Respiratory: Effort normal.  Limited inspiratory effort but clear to auscultation  GI: Soft. Bowel sounds are normal. He exhibits no distension.  Musculoskeletal:  No edema or tenderness in extremities  Neurological: He is alert.  Oriented to person place and date of birth.  Limited medical historian Motor: 4-/5 grossly throughout  Skin:  Surgical chest incision site clean and dry  Psychiatric: He has a normal mood and affect. His behavior is normal.    Lab Results Last 24 Hours       Results for orders placed or performed during the hospital encounter of 10/09/16 (from the past 24 hour(s))  Glucose,  capillary     Status: Abnormal   Collection Time: 10/26/16 12:26 PM  Result Value Ref Range   Glucose-Capillary 211 (H) 65 - 99 mg/dL   Comment 1 Capillary Specimen    Comment 2 Notify RN   Glucose, capillary     Status: None   Collection Time: 10/26/16  3:27 PM  Result Value Ref Range   Glucose-Capillary 73 65 - 99 mg/dL   Comment 1 Capillary Specimen    Comment 2 Notify RN   I-STAT, chem 8     Status: Abnormal   Collection Time: 10/26/16  4:21 PM  Result Value Ref Range   Sodium 137 135 - 145 mmol/L   Potassium 3.1 (L) 3.5 - 5.1 mmol/L   Chloride 96 (L) 101 - 111 mmol/L   BUN 56 (H) 6 - 20 mg/dL   Creatinine, Ser 3.30 (H) 0.61 - 1.24 mg/dL   Glucose, Bld 60 (L) 65 - 99 mg/dL   Calcium, Ion 1.07 (L) 1.15 - 1.40 mmol/L   TCO2 28 22 - 32 mmol/L   Hemoglobin 8.2 (L) 13.0 - 17.0 g/dL   HCT 24.0 (L) 39.0 - 52.0 %  Glucose, capillary     Status: Abnormal   Collection Time: 10/26/16  8:26 PM  Result Value Ref Range   Glucose-Capillary 157 (H) 65 - 99 mg/dL  Glucose, capillary     Status: Abnormal   Collection Time: 10/26/16 11:57 PM  Result Value Ref Range   Glucose-Capillary 212 (H) 65 - 99 mg/dL   Comment 1 Capillary Specimen   Magnesium     Status: None   Collection Time: 10/27/16  4:00 AM  Result Value Ref Range   Magnesium 2.2 1.7 - 2.4 mg/dL  APTT     Status: None   Collection Time: 10/27/16  4:00 AM  Result Value Ref Range   aPTT 32 24 - 36 seconds  Protime-INR     Status: Abnormal   Collection Time: 10/27/16  4:00 AM  Result Value Ref Range   Prothrombin Time 15.3 (H) 11.4 - 15.2 seconds   INR 1.22   Phosphorus     Status: None   Collection Time: 10/27/16  4:00 AM  Result Value Ref Range   Phosphorus 3.1 2.5 - 4.6 mg/dL  CBC     Status: Abnormal   Collection Time: 10/27/16  4:00 AM  Result Value Ref Range   WBC 10.5 4.0 - 10.5 K/uL   RBC 2.41 (L) 4.22 - 5.81 MIL/uL   Hemoglobin 7.3 (L) 13.0 - 17.0 g/dL  HCT 22.5  (L) 39.0 - 52.0 %   MCV 93.4 78.0 - 100.0 fL   MCH 30.3 26.0 - 34.0 pg   MCHC 32.4 30.0 - 36.0 g/dL   RDW 19.2 (H) 11.5 - 15.5 %   Platelets 193 150 - 400 K/uL  Basic metabolic panel     Status: Abnormal   Collection Time: 10/27/16  4:00 AM  Result Value Ref Range   Sodium 135 135 - 145 mmol/L   Potassium 3.2 (L) 3.5 - 5.1 mmol/L   Chloride 100 (L) 101 - 111 mmol/L   CO2 25 22 - 32 mmol/L   Glucose, Bld 131 (H) 65 - 99 mg/dL   BUN 72 (H) 6 - 20 mg/dL   Creatinine, Ser 3.87 (H) 0.61 - 1.24 mg/dL   Calcium 7.8 (L) 8.9 - 10.3 mg/dL   GFR calc non Af Amer 13 (L) >60 mL/min   GFR calc Af Amer 15 (L) >60 mL/min   Anion gap 10 5 - 15  Glucose, capillary     Status: Abnormal   Collection Time: 10/27/16  4:06 AM  Result Value Ref Range   Glucose-Capillary 138 (H) 65 - 99 mg/dL  Glucose, capillary     Status: None   Collection Time: 10/27/16  7:48 AM  Result Value Ref Range   Glucose-Capillary 94 65 - 99 mg/dL      Imaging Results (Last 48 hours)  Dg Chest Port 1 View  Result Date: 10/27/2016 CLINICAL DATA:  Chest pain.  Recent coronary artery bypass grafting EXAM: PORTABLE CHEST 1 VIEW COMPARISON:  October 22, 2016 FINDINGS: Right jugular catheter tip is unchanged in position near the junction of the right jugular vein and superior vena cava. Left subclavian catheter tip is in the superior vena cava. Feeding tube tip is below the diaphragm. No pneumothorax. There is slightly less interstitial edema compared to prior study. There are areas of patchy atelectasis in both mid and lower lung zones. There is no frank airspace consolidation. There is cardiomegaly with mild pulmonary venous hypertension. There is aortic atherosclerosis. Total shoulder replacement noted on the left. Left atrial appendage clamp present. Patient is status post coronary artery bypass grafting. There are surgical clips in the right cervicothoracic junction region. There is left sided carotid  artery calcification. IMPRESSION: Tube and catheter positions as described without evident pneumothorax. Evidence a degree of congestive heart failure with less edema compared to most recent study. Areas of patchy atelectasis without evidence of frank airspace consolidation. Cardiac silhouette is stable. There is aortic atherosclerosis. Aortic Atherosclerosis (ICD10-I70.0). Electronically Signed   By: Lowella Grip III M.D.   On: 10/27/2016 07:30     Assessment/Plan: Diagnosis: Debility Labs independently reviewed.  Records reviewed and summated above.  1. Does the need for close, 24 hr/day medical supervision in concert with the patient's rehab needs make it unreasonable for this patient to be served in a less intensive setting? Yes 2. Co-Morbidities requiring supervision/potential complications:  Acute on chronic anemia (transfuse as necessary to ensure appropriate perfusion for increased activity tolerance), AKI on CKD currently receiving HD(avoid nephrotoxic meds), HTN (monitor and provide prns in accordance with increased physical exertion and pain), chronic diastolic congestive heart failure (monitor for signs/symptoms of fluid overload), recurrent PAF (cont meds, monitor HR with increased mobility), COPD (monitor RR and O2 Sats with increased mobility),  remote tobacco abuse, rheumatoid arthritis (monitor for flares), diabetes mellitus (Monitor in accordance with exercise and adjust meds as necessary) 3. Due to bladder  management, bowel management, safety, skin/wound care, disease management, pain management and patient education, does the patient require 24 hr/day rehab nursing? Yes 4. Does the patient require coordinated care of a physician, rehab nurse, PT (1-2 hrs/day, 5 days/week), OT (1-2 hrs/day, 5 days/week) and SLP (1-2 hrs/day, 5 days/week) to address physical and functional deficits in the context of the above medical diagnosis(es)? Yes Addressing deficits in the following areas:  balance, endurance, locomotion, strength, transferring, bowel/bladder control, bathing, dressing, toileting, swallowing and psychosocial support 5. Can the patient actively participate in an intensive therapy program of at least 3 hrs of therapy per day at least 5 days per week? Potentially 6. The potential for patient to make measurable gains while on inpatient rehab is good 7. Anticipated functional outcomes upon discharge from inpatient rehab are min assist  with PT, min assist with OT, modified independent and supervision with SLP. 8. Estimated rehab length of stay to reach the above functional goals is: 18-22 days. 9. Anticipated D/C setting: Other 10. Anticipated post D/C treatments: SNF 11. Overall Rehab/Functional Prognosis: good  RECOMMENDATIONS: This patient's condition is appropriate for continued rehabilitative care in the following setting: Pt refusing CIR and states he would like to go to [facility] at discharge.  Given level of activity tolerance, this is appropriate.   Patient has agreed to participate in recommended program. Yes Note that insurance prior authorization may be required for reimbursement for recommended care.  Comment: Rehab Admissions Coordinator to follow up.  Delice Lesch, MD, ABPMR Lauraine Rinne J., PA-C 10/27/2016

## 2016-11-03 NOTE — Progress Notes (Signed)
Subjective: Interval History: has complaints , itching on back.  Objective: Vital signs in last 24 hours: Temp:  [97.8 F (36.6 C)-98.1 F (36.7 C)] 97.8 F (36.6 C) (10/22 0337) Pulse Rate:  [69-100] 85 (10/22 0700) Resp:  [12-30] 19 (10/22 0700) BP: (81-133)/(43-77) 109/51 (10/22 0700) SpO2:  [92 %-100 %] 99 % (10/22 0700) Weight:  [92.6 kg (204 lb 2.3 oz)] 92.6 kg (204 lb 2.3 oz) (10/22 0500) Weight change: 0.9 kg (1 lb 15.8 oz)  Intake/Output from previous day: 10/21 0701 - 10/22 0700 In: 750 [P.O.:750] Out: -  Intake/Output this shift: No intake/output data recorded.  General appearance: alert, cooperative, no distress, moderately obese, pale and chronically ill Resp: rales bibasilar and rhonchi bibasilar Chest wall: RIJ cath Cardio: regular rate and rhythm and systolic murmur: holosystolic 2/6, blowing at apex GI: obese, pos bs, liver down 5 cm Extremities: edema 1+ and AVF RUA  Lab Results:  Recent Labs  11/01/16 0307  WBC 9.9  HGB 9.1*  HCT 28.4*  PLT 223   BMET:  Recent Labs  11/01/16 0307  NA 132*  K 4.4  CL 96*  CO2 21*  GLUCOSE 154*  BUN 81*  CREATININE 5.85*  CALCIUM 7.9*   No results for input(s): PTH in the last 72 hours. Iron Studies: No results for input(s): IRON, TIBC, TRANSFERRIN, FERRITIN in the last 72 hours.  Studies/Results: No results found.  I have reviewed the patient's current medications.  Assessment/Plan: 1 ESRD AKI on severe CKD has no function on his own .  HD TTS. Some vol xs 2 DM controlled 3 Anemia esa, check Fe 4 HTPTH check 5 Debill needs Rehab 6 REsp needs IS 7 CAD pos CABG/Maze  Per [facility] P HD, mobilize, esa, check Fe/Pth    LOS: 25 days   Layza Summa L 11/03/2016,7:43 AM

## 2016-11-04 ENCOUNTER — Inpatient Hospital Stay (HOSPITAL_COMMUNITY): Payer: Medicare Other | Admitting: Physical Therapy

## 2016-11-04 ENCOUNTER — Inpatient Hospital Stay (HOSPITAL_COMMUNITY): Payer: Medicare Other | Admitting: Speech Pathology

## 2016-11-04 ENCOUNTER — Inpatient Hospital Stay (HOSPITAL_COMMUNITY): Payer: Medicare Other | Admitting: Occupational Therapy

## 2016-11-04 DIAGNOSIS — E1142 Type 2 diabetes mellitus with diabetic polyneuropathy: Secondary | ICD-10-CM

## 2016-11-04 DIAGNOSIS — D62 Acute posthemorrhagic anemia: Secondary | ICD-10-CM

## 2016-11-04 DIAGNOSIS — R5381 Other malaise: Principal | ICD-10-CM

## 2016-11-04 DIAGNOSIS — I5032 Chronic diastolic (congestive) heart failure: Secondary | ICD-10-CM

## 2016-11-04 DIAGNOSIS — I480 Paroxysmal atrial fibrillation: Secondary | ICD-10-CM

## 2016-11-04 DIAGNOSIS — R0602 Shortness of breath: Secondary | ICD-10-CM

## 2016-11-04 DIAGNOSIS — R1310 Dysphagia, unspecified: Secondary | ICD-10-CM

## 2016-11-04 DIAGNOSIS — D638 Anemia in other chronic diseases classified elsewhere: Secondary | ICD-10-CM

## 2016-11-04 LAB — CBC WITH DIFFERENTIAL/PLATELET
BASOS ABS: 0 10*3/uL (ref 0.0–0.1)
BASOS PCT: 0 %
Eosinophils Absolute: 0.5 10*3/uL (ref 0.0–0.7)
Eosinophils Relative: 6 %
HEMATOCRIT: 27.8 % — AB (ref 39.0–52.0)
Hemoglobin: 8.8 g/dL — ABNORMAL LOW (ref 13.0–17.0)
Lymphocytes Relative: 17 %
Lymphs Abs: 1.3 10*3/uL (ref 0.7–4.0)
MCH: 28.9 pg (ref 26.0–34.0)
MCHC: 31.7 g/dL (ref 30.0–36.0)
MCV: 91.4 fL (ref 78.0–100.0)
Monocytes Absolute: 0.6 10*3/uL (ref 0.1–1.0)
Monocytes Relative: 8 %
NEUTROS ABS: 5.2 10*3/uL (ref 1.7–7.7)
NEUTROS PCT: 69 %
Platelets: 260 10*3/uL (ref 150–400)
RBC: 3.04 MIL/uL — AB (ref 4.22–5.81)
RDW: 16.4 % — ABNORMAL HIGH (ref 11.5–15.5)
WBC: 7.6 10*3/uL (ref 4.0–10.5)

## 2016-11-04 LAB — COMPREHENSIVE METABOLIC PANEL
ALBUMIN: 2.2 g/dL — AB (ref 3.5–5.0)
ALK PHOS: 90 U/L (ref 38–126)
ALT: 23 U/L (ref 17–63)
ANION GAP: 14 (ref 5–15)
AST: 18 U/L (ref 15–41)
BILIRUBIN TOTAL: 0.7 mg/dL (ref 0.3–1.2)
BUN: 80 mg/dL — ABNORMAL HIGH (ref 6–20)
CHLORIDE: 96 mmol/L — AB (ref 101–111)
CO2: 24 mmol/L (ref 22–32)
Calcium: 8.2 mg/dL — ABNORMAL LOW (ref 8.9–10.3)
Creatinine, Ser: 6.66 mg/dL — ABNORMAL HIGH (ref 0.61–1.24)
GFR calc non Af Amer: 7 mL/min — ABNORMAL LOW (ref >60)
GFR, EST AFRICAN AMERICAN: 8 mL/min — AB (ref >60)
Glucose, Bld: 79 mg/dL (ref 65–99)
POTASSIUM: 5.8 mmol/L — AB (ref 3.5–5.1)
Sodium: 134 mmol/L — ABNORMAL LOW (ref 135–145)
Total Protein: 5 g/dL — ABNORMAL LOW (ref 6.5–8.1)

## 2016-11-04 LAB — GLUCOSE, CAPILLARY
GLUCOSE-CAPILLARY: 74 mg/dL (ref 65–99)
GLUCOSE-CAPILLARY: 102 mg/dL — AB (ref 65–99)
Glucose-Capillary: 90 mg/dL (ref 65–99)
Glucose-Capillary: 140 mg/dL — ABNORMAL HIGH (ref 65–99)
Glucose-Capillary: 200 mg/dL — ABNORMAL HIGH (ref 65–99)

## 2016-11-04 LAB — PROTIME-INR
INR: 2.15
PROTHROMBIN TIME: 23.8 s — AB (ref 11.4–15.2)

## 2016-11-04 MED ORDER — DARBEPOETIN ALFA 200 MCG/0.4ML IJ SOSY
200.0000 ug | PREFILLED_SYRINGE | INTRAMUSCULAR | Status: DC
  Administered 2016-11-05: 200 ug via INTRAVENOUS
  Filled 2016-11-04: qty 0.4

## 2016-11-04 MED ORDER — MIDODRINE HCL 5 MG PO TABS
10.0000 mg | ORAL_TABLET | Freq: Three times a day (TID) | ORAL | Status: DC
  Administered 2016-11-04 – 2016-11-20 (×48): 10 mg via ORAL
  Filled 2016-11-04 (×46): qty 2

## 2016-11-04 MED ORDER — WARFARIN SODIUM 5 MG PO TABS
5.0000 mg | ORAL_TABLET | Freq: Once | ORAL | Status: AC
  Administered 2016-11-04: 5 mg via ORAL
  Filled 2016-11-04: qty 1

## 2016-11-04 NOTE — Progress Notes (Signed)
Social Work Assessment and Plan Social Work Assessment and Plan  Patient Details  Name: [name] MRN: [id number] Date of Birth: November 30, 1930  Today's Date: 11/04/2016  Problem List:  Patient Active Problem List   Diagnosis Date Noted  . Shortness of breath   . Type 2 diabetes mellitus with peripheral neuropathy (HCC)   . PAF (paroxysmal atrial fibrillation) ([place])   . Debility 11/03/2016  . Coronary artery disease involving native heart without angina pectoris   . AKI (acute kidney injury) ([place])   . Dysphagia   . Chronic diastolic heart failure ([place])   . Benign prostatic hyperplasia   . Hx of CABG   . S/P CABG (coronary artery bypass graft)   . Acute blood loss anemia   . Anemia of chronic disease   . Chronic obstructive pulmonary disease ([place])   . Cardiogenic shock ([place])   . Spontaneous pneumothorax   . Acute respiratory failure with hypercapnia ([place])   . Acute renal failure ([place])   . S/P CABG x 2 + maze procedure 10/09/2016  . S/P Maze operation for atrial fibrillation 10/09/2016  . Coronary artery disease involving native coronary artery of native heart with angina pectoris ([place]) 09/30/2016  . Fusarium infection ([place]) 01/10/2016  . Dyspnea on exertion 09/12/2015  . Sepsis due to undetermined organism ([place])   . Chronic systolic CHF (congestive heart failure) ([place])   . Chronic kidney disease (CKD), stage IV (severe) ([place])   . Cellulitis of left lower extremity   . Pulmonary hypertension ([place])   . Paroxysmal atrial fibrillation (HCC)   . Other emphysema ([place])   . Bronchiectasis without complication ([place])   . Other specified hypothyroidism   . Fever 08/18/2014  . Sepsis ([place]) 08/18/2014  . CKD (chronic kidney disease) stage 4, GFR 15-29 ml/min (HCC) 08/18/2014  . Acute on chronic systolic heart failure ([place]) 08/18/2014  . Bronchiectasis ([place]) 08/18/2014  . Hypothyroidism 08/18/2014  . Bronchiectasis without acute exacerbation ([place]) 12/29/2013  . LVH (left ventricular  hypertrophy) due to hypertensive disease 12/10/2013  . Atrial fibrillation ([place]) 12/08/2013  . CHF (congestive heart failure) ([place]) 12/07/2013  . Chronic obstructive airway disease with asthma ([place]) 12/07/2013  . Thyroid goiter 02/01/2013  . Low TSH level 05/18/2012  . Right thyroid nodule 05/18/2012  . Diabetes ([place]) 05/18/2012  . HLD (hyperlipidemia) 05/18/2012  . Rheumatoid arthritis ([place]) 05/18/2012  . Essential hypertension 05/18/2012  . GERD (gastroesophageal reflux disease) 05/18/2012  . Anemia 05/18/2012  . Knee pain 02/12/2012   Past Medical History:  Past Medical History:  Diagnosis Date  . Anemia   . Asthma   . Atrial fibrillation ([place]) 12/08/2013  . CHF (congestive heart failure) ([place])   . Chronic joint pain    "from RA" (10/01/2016)  . CKD (chronic kidney disease), stage III ([place])   . Constipation    takes stool softener daily  . COPD (chronic obstructive pulmonary disease) ([place])   . Coronary artery disease   . Coronary artery disease involving native coronary artery of native heart with angina pectoris ([place]) 09/30/2016  . Dysrhythmia    a-fib  . GERD (gastroesophageal reflux disease)    takes Zantac daily   . History of blood transfusion    "related to one of my ORs"  . Hyperlipidemia    takes Pravastatin daily  . Hypertension    takes Cardizem,Proscar,and Lisinopril daily  . Hypothyroidism   . Impaired hearing    wears hearing aids  . Joint swelling    "  from RA" (10/01/2016)  . Peripheral neuropathy   . Pneumonia 2017  . Rheumatoid arthritis ([place])    "all over; take orencia  q 4 wks" (10/01/2016)  . S/P CABG x 2 10/09/2016   LIMA to LAD, SVG to OM, EVH via right thigh  . S/P Maze operation for atrial fibrillation 10/09/2016   Complete bilateral atrial lesion set using bipolar radiofrequency and cryothermy ablation with clipping of LA appendage  . Type II diabetes mellitus ([place])    Past Surgical History:  Past Surgical History:  Procedure Laterality  Date  . AMPUTATION TOE Left 11/08/2015   Procedure: LEFT HALLUX AMPUTATION AT THE METATARSOPHALANGEAL JOINT;  Surgeon: Wylene Simmer, MD;  Location: [facility];  Service: Orthopedics;  Laterality: Left;  . ANKLE FUSION Left 2014  . AV FISTULA PLACEMENT Right 10/28/2016   Procedure: ARTERIOVENOUS  FISTULA CREATION right arm;  Surgeon: Angelia Mould, MD;  Location: [facility];  Service: Vascular;  Laterality: Right;  . BIOPSY THYROID  2014  . CARDIAC CATHETERIZATION  09/30/2016  . CARPAL TUNNEL RELEASE Bilateral 1990's  . CATARACT EXTRACTION W/ INTRAOCULAR LENS  IMPLANT, BILATERAL Bilateral 1990's  . CLIPPING OF ATRIAL APPENDAGE  10/09/2016   Procedure: CLIPPING OF ATRIAL APPENDAGE;  Surgeon: Rexene Alberts, MD;  Location: [facility];  Service: Open Heart Surgery;;  . COLONOSCOPY    . CORONARY ARTERY BYPASS GRAFT N/A 10/09/2016   Procedure: CORONARY ARTERY BYPASS GRAFTING (CABG) x two, using left internal mammary artery and right leg greater saphenous vein harvested endoscopically;  Surgeon: Rexene Alberts, MD;  Location: [facility];  Service: Open Heart Surgery;  Laterality: N/A;  . FOREIGN BODY REMOVAL Right 09/06/2013   Procedure: REMOVAL FOREIGN BODY RIGHT INDEX FINGER;  Surgeon: Daryll Brod, MD;  Location: [facility];  Service: Orthopedics;  Laterality: Right;  ANESTHESIA: IV REGIONAL FAB  . INSERTION OF DIALYSIS CATHETER N/A 10/28/2016   Procedure: INSERTION OF DIALYSIS CATHETER;  Surgeon: Angelia Mould, MD;  Location[facility];  Service: Vascular;  Laterality: N/A;  . JOINT REPLACEMENT    . MAZE N/A 10/09/2016   Procedure: MAZE;  Surgeon: Rexene Alberts, MD;  Location: [facility];  Service: Open Heart Surgery;  Laterality: N/A;  . REPAIR TENDONS FOOT Left 1946; 1984   "farming accident; repaired tendons both times"  . RIGHT/LEFT HEART CATH AND CORONARY ANGIOGRAPHY N/A 09/30/2016   Procedure: RIGHT/LEFT HEART CATH AND CORONARY ANGIOGRAPHY;  Surgeon: Nigel Mormon, MD;  Location: [facility];  Service: Cardiovascular;  Laterality: N/A;  . SHOULDER ARTHROSCOPY W/ ROTATOR CUFF REPAIR Right 1990's  . TEE WITHOUT CARDIOVERSION N/A 10/09/2016   Procedure: TRANSESOPHAGEAL ECHOCARDIOGRAM (TEE);  Surgeon: Rexene Alberts, MD;  Location: [facility];  Service: Open Heart Surgery;  Laterality: N/A;  . THYROIDECTOMY Right 02/01/2013   Procedure: RIGHT THYROIDECTOMY LOBECTOMY;  Surgeon: Earnstine Regal, MD;  Location: [facility];  Service: General;  Laterality: Right;  . TONSILLECTOMY  1930's  . TOTAL KNEE ARTHROPLASTY Right 12/16/2010   Procedure: TOTAL KNEE ARTHROPLASTY; rt Surgeon: Rudean Haskell, MD;  Location: [facility];  Service: Orthopedics;  Laterality: Right;  . TOTAL SHOULDER REPLACEMENT Left 03/2010   Social History:  reports that he quit smoking about 51 years ago. His smoking use included Cigarettes. He has a 15.00 pack-year smoking history. He has never used smokeless tobacco. He reports that he does not drink alcohol or use drugs.  Family / Support Systems Marital Status: Married Patient  Roles: Spouse, Parent, Other (Comment) (employee) Spouse/Significant Other: Patent attorney (memory issues) Children: Edreen-daughter  Other Supports: Pt and wife have six children-four local who are involved Anticipated Caregiver: Children who are currently taking care of wife at home Ability/Limitations of Caregiver: Two daughter's main ones who are caring for wife at home-can't stay alone Caregiver Availability: 24/7 Family Dynamics: Close knit family pt was active and worked one day a week. They have six children all of which will assist and do whatever is needed for parents. Two daughter's are currently staying with wife and providing transportation.  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    Abuse/Neglect Physical Abuse: Denies Verbal Abuse: Denies Sexual Abuse: Denies Exploitation of patient/patient's resources: Denies Self-Neglect: Denies  Emotional Status Pt's affect, behavior adn adjustment status: Pt is motivated to do well and recover from his heart surgery. He has always been one to be independent and self sufficient. He helps his wife at home due to her memory issues. He will do his best and see how he does here. Recent Psychosocial Issues: other health issues-were managed prior to his heart surgery Pyschiatric History: No history deferred depression screen due to coping appropriately and adjusting to the new unit. Will aks team if would benefit from seeing neuro-psych while here. Pt feels his first day is going well and he is hanging in there. Substance Abuse History: Quit tobacco no other issues  Patient / Family Perceptions, Expectations & Goals Pt/Family understanding of illness & functional limitations: Pt is able to explain his heart surgery and other health issues. He feels his main issue is his deconditioning from being in a hospital for a month. He does talk with the MD and feels he has a good understanding of his treatment plan. Premorbid pt/family roles/activities: Husband, father, grandfather, retiree,employee, church member, etc Anticipated changes in roles/activities/participation: resume Pt/family expectations/goals: Pt states: " I want to be able to do for myself I don't want to rely upon my daughter's."  " I was helping my wife before this."  Daughter states: " I hope he does well here."  [employer]: None Premorbid Home Care/DME Agencies:  Other (Comment) (has DME from past surgeries) Transportation available at discharge: [place] referrals recommended: Support group (specify)  Discharge Planning Living Arrangements: Spouse/significant other Support Systems: Spouse/significant other, Children, Other relatives, Friends/neighbors, Church/faith community Type of Residence: Private residence Insurance Resources: [employer], Multimedia programmer (specify) Financial Resources: Employment, Radio broadcast assistant Screen Referred: No Living Expenses: Own Money Management: Patient Does the patient have any problems obtaining your medications?: No Home Management: Pt and daughter's-wife can do some of the home management Patient/Family Preliminary Plans: Return home with wife and daughter if needed. Daughter's are staying with their Mom while pt is here due to her memory issues. Pt reports she shouldn't be left alone. [place] therapy team's evaluations and will work on a safe discharge plan. Sw Barriers to Discharge: Decreased caregiver support Sw Barriers to Discharge Comments: Wife has memory issues and needs supervision herself Social Work Anticipated Follow Up Needs: HH/OP, Support Group  Clinical Impression Pleasant gentleman who is motivated and one that  has always been independent and caregiver of others-wife. He is hopeful he will do well here and if not will go to [facility] upon discharge from CIR. He wanted to be closer to home to begin with so he could see his wife daily.  Elease Hashimoto 11/04/2016, 1:16 PM

## 2016-11-04 NOTE — Progress Notes (Signed)
Subjective: Interval History: has complaints a little SOB.  Objective: Vital signs in last 24 hours: Temp:  [97.5 F (36.4 C)-97.6 F (36.4 C)] 97.6 F (36.4 C) (10/23 0252) Pulse Rate:  [70-79] 70 (10/23 0252) Resp:  [14-23] 20 (10/22 1500) BP: (85-108)/(46-59) 91/49 (10/23 0252) SpO2:  [98 %-100 %] 100 % (10/23 0252) Weight:  [93 kg (205 lb 0.4 oz)] 93 kg (205 lb 0.4 oz) (10/23 0314) Weight change:   Intake/Output from previous day: No intake/output data recorded. Intake/Output this shift: Total I/O In: 120 [P.O.:120] Out: -   General appearance: alert, cooperative, no distress, mildly obese and pale Resp: rales bibasilar, rhonchi bibasilar and wheezes bibasilar Chest wall: RIJ cath Cardio: S1, S2 normal and systolic murmur: systolic ejection 2/6, decrescendo at 2nd left intercostal space GI: pos bs, liver down 4 cm soft Extremities: edema 1-2+, avf RUA  Lab Results:  Recent Labs  11/04/16 0541  WBC 7.6  HGB 8.8*  HCT 27.8*  PLT 260   BMET:  Recent Labs  11/04/16 0541  NA 134*  K 5.8*  CL 96*  CO2 24  GLUCOSE 79  BUN 80*  CREATININE 6.66*  CALCIUM 8.2*   No results for input(s): PTH in the last 72 hours. Iron Studies: No results for input(s): IRON, TIBC, TRANSFERRIN, FERRITIN in the last 72 hours.  Studies/Results: Dg Chest 2 View  Result Date: 11/03/2016 CLINICAL DATA:  Shortness of breath EXAM: CHEST  2 VIEW COMPARISON:  Chest x-rays dated 10/31/2016 in 10/2015 2018. FINDINGS: Stable cardiomegaly. Median sternotomy wires appear intact and stable in alignment. Right chest wall dialysis catheter appears well positioned with tip at the level of the lower SVC/ cavoatrial junction. There is persistent mild central pulmonary vascular congestion and interstitial edema. No overt alveolar pulmonary edema. No new confluent airspace opacity to suggest a developing pneumonia. No pleural effusion or pneumothorax seen. IMPRESSION: 1. Persistent central pulmonary  vascular congestion and mild bilateral interstitial edema, compatible with continued mild CHF/volume overload. 2. No evidence of pneumonia or alveolar pulmonary edema. 3. Stable cardiomegaly. Electronically Signed   By: Franki Cabot M.D.   On: 11/03/2016 21:28    I have reviewed the patient's current medications.  Assessment/Plan: 1 ESRD will try to lower vol and solute/K.  bp low, will start Mido 2 CAD s/p CABG/MAZE. Per [facility] 3 Anemia needs esa, check Fe 4 HPTH check 5 COPD ? Vol vs flare 6 DM per primary 7 [place] P Hd, esa, lower vol, mido started    LOS: 1 day   [name] L 11/04/2016,10:54 AM

## 2016-11-04 NOTE — Progress Notes (Signed)
Patient information reviewed and entered into eRehab system by Buena Boehm, RN, CRRN, PPS Coordinator.  Information including medical coding and functional independence measure will be reviewed and updated through discharge.     Per nursing patient was given "Data Collection Information Summary for Patients in Inpatient Rehabilitation Facilities with attached "Privacy Act Statement-Health Care Records" upon admission.  

## 2016-11-04 NOTE — Evaluation (Signed)
Occupational Therapy Assessment and Plan  Patient Details  Name: [name] MRN: [id number] Date of Birth: February 03, 1930  OT Diagnosis: muscle weakness (generalized) Rehab Potential: Rehab Potential (ACUTE ONLY): Excellent ELOS: 10-14 days   Today's Date: 11/04/2016 OT Individual Time: 4665-9935 OT Individual Time Calculation (min): 63 min     Problem List:  Patient Active Problem List   Diagnosis Date Noted  . Shortness of breath   . Type 2 diabetes mellitus with peripheral neuropathy (HCC)   . PAF (paroxysmal atrial fibrillation) ([place])   . Debility 11/03/2016  . Coronary artery disease involving native heart without angina pectoris   . AKI (acute kidney injury) ([place])   . Dysphagia   . Chronic diastolic heart failure ([place])   . Benign prostatic hyperplasia   . Hx of CABG   . S/P CABG (coronary artery bypass graft)   . Acute blood loss anemia   . Anemia of chronic disease   . Chronic obstructive pulmonary disease ([place])   . Cardiogenic shock ([place])   . Spontaneous pneumothorax   . Acute respiratory failure with hypercapnia ([place])   . Acute renal failure ([place])   . S/P CABG x 2 + maze procedure 10/09/2016  . S/P Maze operation for atrial fibrillation 10/09/2016  . Coronary artery disease involving native coronary artery of native heart with angina pectoris ([place]) 09/30/2016  . Fusarium infection ([place]) 01/10/2016  . Dyspnea on exertion 09/12/2015  . Sepsis due to undetermined organism ([place])   . Chronic systolic CHF (congestive heart failure) ([place])   . Chronic kidney disease (CKD), stage IV (severe) ([place])   . Cellulitis of left lower extremity   . Pulmonary hypertension ([place])   . Paroxysmal atrial fibrillation (HCC)   . Other emphysema ([place])   . Bronchiectasis without complication ([place])   . Other specified hypothyroidism   . Fever 08/18/2014  . Sepsis ([place]) 08/18/2014  . CKD (chronic kidney disease) stage 4, GFR 15-29 ml/min (HCC) 08/18/2014  . Acute on chronic systolic heart  failure ([place]) 08/18/2014  . Bronchiectasis ([place]) 08/18/2014  . Hypothyroidism 08/18/2014  . Bronchiectasis without acute exacerbation ([place]) 12/29/2013  . LVH (left ventricular hypertrophy) due to hypertensive disease 12/10/2013  . Atrial fibrillation ([place]) 12/08/2013  . CHF (congestive heart failure) ([place]) 12/07/2013  . Chronic obstructive airway disease with asthma ([place]) 12/07/2013  . Thyroid goiter 02/01/2013  . Low TSH level 05/18/2012  . Right thyroid nodule 05/18/2012  . Diabetes ([place]) 05/18/2012  . HLD (hyperlipidemia) 05/18/2012  . Rheumatoid arthritis ([place]) 05/18/2012  . Essential hypertension 05/18/2012  . GERD (gastroesophageal reflux disease) 05/18/2012  . Anemia 05/18/2012  . Knee pain 02/12/2012    Past Medical History:  Past Medical History:  Diagnosis Date  . Anemia   . Asthma   . Atrial fibrillation ([place]) 12/08/2013  . CHF (congestive heart failure) ([place])   . Chronic joint pain    "from RA" (10/01/2016)  . CKD (chronic kidney disease), stage III ([place])   . Constipation    takes stool softener daily  . COPD (chronic obstructive pulmonary disease) ([place])   . Coronary artery disease   . Coronary artery disease involving native coronary artery of native heart with angina pectoris ([place]) 09/30/2016  . Dysrhythmia    a-fib  . GERD (gastroesophageal reflux disease)    takes Zantac daily   . History of blood transfusion    "related to one of my ORs"  . Hyperlipidemia    takes Pravastatin daily  . Hypertension  takes Cardizem,Proscar,and Lisinopril daily  . Hypothyroidism   . Impaired hearing    wears hearing aids  . Joint swelling    "from RA" (10/01/2016)  . Peripheral neuropathy   . Pneumonia 2017  . Rheumatoid arthritis ([place])    "all over; take orencia  q 4 wks" (10/01/2016)  . S/P CABG x 2 10/09/2016   LIMA to LAD, SVG to OM, EVH via right thigh  . S/P Maze operation for atrial fibrillation 10/09/2016   Complete bilateral atrial lesion set using bipolar  radiofrequency and cryothermy ablation with clipping of LA appendage  . Type II diabetes mellitus ([place])    Past Surgical History:  Past Surgical History:  Procedure Laterality Date  . AMPUTATION TOE Left 11/08/2015   Procedure: LEFT HALLUX AMPUTATION AT THE METATARSOPHALANGEAL JOINT;  Surgeon: Wylene Simmer, MD;  Location: [facility];  Service: Orthopedics;  Laterality: Left;  . ANKLE FUSION Left 2014  . AV FISTULA PLACEMENT Right 10/28/2016   Procedure: ARTERIOVENOUS  FISTULA CREATION right arm;  Surgeon: Angelia Mould, MD;  Location: [facility];  Service: Vascular;  Laterality: Right;  . BIOPSY THYROID  2014  . CARDIAC CATHETERIZATION  09/30/2016  . CARPAL TUNNEL RELEASE Bilateral 1990's  . CATARACT EXTRACTION W/ INTRAOCULAR LENS  IMPLANT, BILATERAL Bilateral 1990's  . CLIPPING OF ATRIAL APPENDAGE  10/09/2016   Procedure: CLIPPING OF ATRIAL APPENDAGE;  Surgeon: Rexene Alberts, MD;  Location: [facility];  Service: Open Heart Surgery;;  . COLONOSCOPY    . CORONARY ARTERY BYPASS GRAFT N/A 10/09/2016   Procedure: CORONARY ARTERY BYPASS GRAFTING (CABG) x two, using left internal mammary artery and right leg greater saphenous vein harvested endoscopically;  Surgeon: Rexene Alberts, MD;  Location: [facility];  Service: Open Heart Surgery;  Laterality: N/A;  . FOREIGN BODY REMOVAL Right 09/06/2013   Procedure: REMOVAL FOREIGN BODY RIGHT INDEX FINGER;  Surgeon: Daryll Brod, MD;  Location: [facility];  Service: Orthopedics;  Laterality: Right;  ANESTHESIA: IV REGIONAL FAB  . INSERTION OF DIALYSIS CATHETER N/A 10/28/2016   Procedure: INSERTION OF DIALYSIS CATHETER;  Surgeon: Angelia Mould, MD;  Location: [facility] OR;  Service: Vascular;  Laterality: N/A;  . JOINT REPLACEMENT    . MAZE N/A 10/09/2016   Procedure: MAZE;  Surgeon: Rexene Alberts, MD;  Location: [facility];  Service: Open Heart Surgery;  Laterality: N/A;  . REPAIR TENDONS FOOT Left 1946; 1984   "farming accident;  repaired tendons both times"  . RIGHT/LEFT HEART CATH AND CORONARY ANGIOGRAPHY N/A 09/30/2016   Procedure: RIGHT/LEFT HEART CATH AND CORONARY ANGIOGRAPHY;  Surgeon: Nigel Mormon, MD;  Location: [facility];  Service: Cardiovascular;  Laterality: N/A;  . SHOULDER ARTHROSCOPY W/ ROTATOR CUFF REPAIR Right 1990's  . TEE WITHOUT CARDIOVERSION N/A 10/09/2016   Procedure: TRANSESOPHAGEAL ECHOCARDIOGRAM (TEE);  Surgeon: Rexene Alberts, MD;  Location: [facility];  Service: Open Heart Surgery;  Laterality: N/A;  . THYROIDECTOMY Right 02/01/2013   Procedure: RIGHT THYROIDECTOMY LOBECTOMY;  Surgeon: Earnstine Regal, MD;  Location: [facility];  Service: General;  Laterality: Right;  . TONSILLECTOMY  1930's  . TOTAL KNEE ARTHROPLASTY Right 12/16/2010   Procedure: TOTAL KNEE ARTHROPLASTY; rt Surgeon: Rudean Haskell, MD;  Location: [facility];  Service: Orthopedics;  Laterality: Right;  . TOTAL SHOULDER REPLACEMENT Left 03/2010    Assessment & Plan Clinical Impression: Patient is a 83 y.o. year old male with recent admission to the hospital on 10/08/2016 with progressive shortness  of breath. Noninvasive echocardiogram with ejection fraction of ~45% no signs of ischemia. Elective cardiac catheterization with noted CAD. Underwent CABG/MAZE procedure 10/09/2016 per Dr. Roxy Manns. Hospital course pain management. Sternal precautions as directed. Nephrology consulted for AKI on CKD. Patient has been receiving hemodialysis Monday Wednesday Friday schedule and monitoring for possible recovery of renal function with latest creatinine 2.99 and plan for ongoing hemodialysis as outpatient . Patient did undergo right IJ tunneled dialysis catheter 95/63/8756/EPPIR brachiocephalic AV fistula per Dr. Scot Dock. Chronic Coumadin therapy has been resumed. Acute on chronic anemia 10.2 and monitored.[name] MBS completed 10/29/2016 currently on a dysphagia #3 honey thick liquid diet. Patient transferred to CIR on 11/03/2016 .    Patient currently  requires max with basic self-care skills secondary to muscle weakness, decreased cardiorespiratoy endurance and decreased oxygen support, decreased memory and decreased sitting balance, decreased standing balance, decreased balance strategies and difficulty maintaining precautions.  Prior to hospitalization, patient could complete BADL with modified independent .  Patient will benefit from skilled intervention to increase independence with basic self-care skills prior to discharge home with care partner.  Anticipate patient will require 24 hour supervision and follow up home health.  OT - End of Session Activity Tolerance: Tolerates < 10 min activity with changes in vital signs Endurance Deficit: Yes Endurance Deficit Description: Multiple rest breaks needed in between BADL tasks OT Assessment Rehab Potential (ACUTE ONLY): Excellent OT Patient demonstrates impairments in the following area(s): Balance;Cognition;Endurance;Motor;Safety OT Basic ADL's Functional Problem(s): Bathing;Dressing;Toileting;Grooming OT Transfers Functional Problem(s): Toilet OT Additional Impairment(s): None OT Plan OT Intensity: Minimum of 1-2 x/day, 45 to 90 minutes OT Frequency: 5 out of 7 days OT Duration/Estimated Length of Stay: 10-14 days OT Treatment/Interventions: Balance/vestibular training;Cognitive remediation/compensation;Community reintegration;Discharge planning;DME/adaptive equipment instruction;Functional mobility training;Patient/family education;Self Care/advanced ADL retraining;Therapeutic Activities;UE/LE Strength taining/ROM;Therapeutic Exercise;UE/LE Coordination activities OT Basic Self-Care Anticipated Outcome(s): mod I OT Toileting Anticipated Outcome(s): mod I OT Bathroom Transfers Anticipated Outcome(s): Supervision OT Recommendation Patient destination: Home Follow Up Recommendations: Home health OT Equipment Recommended: To be determined   Skilled Therapeutic Intervention Initial eval  completed with treatment provided to address functional transfers, sternal precautions, improved sit<>stand, standing tolerance, and adapted bathing/dressing skills. Pt on room air upon OT arrival, but states he felt SOB. SpO2 93 at rest. OT reviewed sternal precautions, then pt came to sitting EOB with Mod A and instructional cues to not use UE's to push or pull up. Pt needed Max A to power up to stand and mod A to pivot to wc. SpO2 checked and pt desat to 80 on RA. Pt placed on 2L of O2 throughout session and O2 saturation stayed above 93%. Bathing/dressing completed sit<>stand at the sink. Pt tolerated 15-20 seconds standing at longest bout but was unable to remove UEs from sink to assist with washing buttocks and peri-area. Pt needed extended rest break s/p stand. Pt needed max A for LB dressing and Mod A for UB dressing. Pt left seated in wc at end of session with needs met and call bell in reach.   OT Evaluation Precautions/Restrictions  Precautions Precautions: Fall;Sternal Restrictions Weight Bearing Restrictions:  (sternal precautions) RUE Weight Bearing: Weight bearing as tolerated Vital Signs Oxygen Therapy SpO2: 96 % O2 Device: Nasal Cannula O2 Flow Rate (L/min): 2 L/min Pain Pain Assessment Pain Assessment: No/denies pain Faces Pain Scale: Hurts worst Pain Type: Acute pain Pain Location: Arm Pain Orientation: Right Pain Intervention(s): Other (Comment) (RN already aware, meds administered during eval) Home Living/Prior [place] expects to be  discharged to:: Private residence Living Arrangements: Spouse/significant other (memory impairment) Available Help at Discharge: Family, Available 24 hours/day Type of Home: House Home Layout: One level Bathroom Shower/Tub: Multimedia programmer: Handicapped height  Lives With: Spouse IADL History Current License: Yes Prior Function Level of Independence: Independent with basic ADLs,  Independent with homemaking with ambulation Vocation: Part time employment ADL ADL ADL Comments: Please see functional navigator Vision Patient Visual Report: No change from baseline Vision Assessment?: No apparent visual deficits Perception  Perception: Within Functional Limits Cognition Overall Cognitive Status: Impaired/Different from baseline Arousal/Alertness: Awake/alert Orientation Level: Person;Place;Situation Person: Oriented Place: Oriented Situation: Oriented Year: 2018 Month: October Day of Week: Correct Memory: Impaired Memory Impairment: Decreased recall of new information Immediate Memory Recall: Blue;Sock;Bed Memory Recall: Sock;Blue;Bed Memory Recall Sock: With Cue Memory Recall Blue: With Cue Memory Recall Bed: With Cue Attention: Sustained Sustained Attention: Appears intact Awareness: Appears intact Problem Solving: Impaired Problem Solving Impairment: Functional complex Safety/Judgment: Appears intact Sensation Sensation Light Touch: Appears Intact Coordination Fine Motor Movements are Fluid and Coordinated: Yes Motor  Motor Motor - Skilled Clinical Observations: Generalized weakness Mobility  Bed Mobility Bed Mobility: Sit to Supine Sit to Supine: 3: Mod assist Sit to Supine - Details: Manual facilitation for weight shifting;Verbal cues for precautions/safety;Verbal cues for technique  Balance Balance Balance Assessed: Yes Static Sitting Balance Static Sitting - Balance Support: No upper extremity supported Static Sitting - Level of Assistance: 4: Min Insurance risk surveyor Standing - Balance Support: During functional activity Static Standing - Level of Assistance: 3: Mod assist Dynamic Standing Balance Dynamic Standing - Balance Support: During functional activity Dynamic Standing - Level of Assistance: 3: Mod assist Extremity/Trunk Assessment RUE Assessment RUE Assessment: Not tested (2/2 stenal precautions,  fnuctional grip strength) LUE Assessment LUE Assessment: Not tested (2/2 stenal precautions, functional grip strength)  See Function Navigator for Current Functional Status.   Refer to Care Plan for Long Term Goals  Recommendations for other services: None   Discharge Criteria: Patient will be discharged from OT if patient refuses treatment 3 consecutive times without medical reason, if treatment goals not met, if there is a change in medical status, if patient makes no progress towards goals or if patient is discharged from hospital.  The above assessment, treatment plan, treatment alternatives and goals were discussed and mutually agreed upon: by patient  Valma Cava 11/04/2016, 1:04 PM

## 2016-11-04 NOTE — IPOC Note (Signed)
Overall Plan of Care [facility]) Patient Details Name: [name] MRN: [id number] DOB: January 17, 1930  Admitting Diagnosis: [facility] Problems: Active Problems:   Debility   Shortness of breath   Type 2 diabetes mellitus with peripheral neuropathy (HCC)   PAF (paroxysmal atrial fibrillation) ([place])     Functional Problem List: Nursing Bladder, Bowel, Skin Integrity, Nutrition, Motor, Edema, Endurance, Medication Management, Safety, Perception  PT Behavior, Balance, Endurance, Pain, Safety  OT Balance, Cognition, Endurance, Motor, Safety  SLP Cognition, Nutrition  TR         Basic ADL's: OT Bathing, Dressing, Toileting, Grooming     Advanced  ADL's: OT       Transfers: PT Bed Mobility, Bed to Chair, Car, [employer], Floor  OT Toilet     Locomotion: PT Ambulation, Emergency planning/management officer, Stairs     Additional Impairments: OT None  SLP Swallowing, Social Cognition   Problem Solving, Memory  TR      Anticipated Outcomes Item Anticipated Outcome  Self Feeding    Swallowing  supervision    Basic self-care  mod I  Toileting  mod I   Bathroom Transfers Supervision  Bowel/Bladder  Pt will manage bowel and bladder with mod assist at discharge   Transfers  Min A   Locomotion  Min A   Communication     Cognition  supervision-mod I  Pain  Pt will manage pain at 3 or less on a scale of 0-10.   Safety/Judgment  Pt will remain free of falls and injury while in rehab with mod assist    Therapy Plan: PT Intensity: Minimum of 1-2 x/day ,45 to 90 minutes PT Frequency: 5 out of 7 days PT Duration Estimated Length of Stay: 10-14 days  OT Intensity: Minimum of 1-2 x/day, 45 to 90 minutes OT Frequency: 5 out of 7 days OT Duration/Estimated Length of Stay: 10-14 days SLP Intensity: Minumum of 1-2 x/day, 30 to 90 minutes SLP Frequency: 3 to 5 out of 7 days SLP Duration/Estimated Length of Stay: 10-14 days     Team Interventions: Nursing Interventions Patient/Family  Education, Bladder Management, Bowel Management, Medication Management, Disease Management/Prevention, Skin Care/Wound Management, Cognitive Remediation/Compensation, Dysphagia/Aspiration Precaution Training, Discharge Planning  PT interventions Ambulation/gait training, Community reintegration, DME/adaptive equipment instruction, Neuromuscular re-education, Stair training, Psychosocial support, UE/LE Strength taining/ROM, UE/LE Coordination activities, Wheelchair propulsion/positioning, Therapeutic Activities, Skin care/wound management, Pain management, Functional electrical stimulation, Discharge planning, Training and development officer, Cognitive remediation/compensation, Disease management/prevention, Functional mobility training, Patient/family education, Splinting/orthotics, Therapeutic Exercise, Visual/perceptual remediation/compensation  OT Interventions Training and development officer, Cognitive remediation/compensation, Community reintegration, Discharge planning, DME/adaptive equipment instruction, Functional mobility training, Patient/family education, Self Care/advanced ADL retraining, Therapeutic Activities, UE/LE Strength taining/ROM, Therapeutic Exercise, UE/LE Coordination activities  SLP Interventions Cognitive remediation/compensation, Cueing hierarchy, Functional tasks, Dysphagia/aspiration precaution training, Internal/external aids, Patient/family education  TR Interventions    SW/CM Interventions Discharge Planning, Psychosocial Support, Patient/Family Education   Barriers to Discharge MD  Medical stability, Lack of/limited family support and Hemodialysis  Nursing      PT Inaccessible home environment, Home environment access/layout, Weight bearing restrictions WB restrictions - sternal precautions, 5 adult children very involved, lives w/ wife who has memory issues   OT      SLP      SW Decreased caregiver support Wife has memory issues and needs supervision herself   Team  Discharge Planning: Destination: PT-Home ,OT- Home , SLP-Home Projected Follow-up: PT-Home health PT, OT-  Home health OT, SLP-24 hour supervision/assistance, Home Health SLP, Outpatient SLP Projected  Equipment Needs: PT-To be determined, OT- To be determined, SLP-To be determined Equipment Details: PT- , OT-  Patient/family involved in discharge planning: PT- Patient, Family member/caregiver,  OT-Patient, SLP-Patient, Family member/caregiver  MD ELOS: 10-14 days. Medical Rehab Prognosis:  Good Assessment: 83 y.o.right handed malewith history of hypertension, chronic diastolic congestive heart failure, stage IV chronic renal insufficiency, recurrent PAF on long-term anticoagulation, COPD with remote tobacco abuse, rheumatoid arthritis and diabetes mellitus. Presented 10/08/2016 with progressive shortness of breath. Noninvasive echocardiogram with ejection fraction of ~45% no signs of ischemia. Elective cardiac catheterization with noted CAD. Underwent CABG/MAZE procedure 10/09/2016 per Dr. Roxy Manns. Hospital course pain management. Sternal precautions as directed. Nephrology consulted for AKI on CKD. Patient has been receiving hemodialysis Monday Wednesday Friday schedule and monitoring for possible recovery of renal function with latest creatinine 2.99 and plan for ongoing hemodialysis as outpatient . Patient did undergo right IJ tunneled dialysis catheter 93/79/0240/XBDZH brachiocephalic AV fistula per Dr. Scot Dock. Chronic Coumadin therapy has been resumed. Acute on chronic anemia 10.2. MBS completed 10/29/2016 currently on a dysphagia #3 honey thick liquid diet. Patient with resulting functional deficits with strength, endurance, swallowing, and self-care.  Will set goals for Min A with PT and Mod I/Supervision with OT/SLP.   See Team Conference Notes for weekly updates to the plan of care

## 2016-11-04 NOTE — Care Management Note (Signed)
[facility] Individual Statement of Services  Patient Name:  [name]  Date:  11/04/2016  Welcome to the [facility].  Our goal is to provide you with an individualized program based on your diagnosis and situation, designed to meet your specific needs.  With this comprehensive rehabilitation program, you will be expected to participate in at least 3 hours of rehabilitation therapies Monday-Friday, with modified therapy programming on the weekends.  Your rehabilitation program will include the following services:  Physical Therapy (PT), Occupational Therapy (OT), Speech Therapy (ST), 24 hour per day rehabilitation nursing, Therapeutic Recreaction (TR), Case Management (Social Worker), Rehabilitation Medicine, Nutrition Services and Pharmacy Services  Weekly team conferences will be held on Wednesday to discuss your progress.  Your Social Worker will talk with you frequently to get your input and to update you on team discussions.  Team conferences with you and your family in attendance may also be held.  Expected length of stay: 10-14 days  Overall anticipated outcome: supervision/min assist level  Depending on your progress and recovery, your program may change. Your Social Worker will coordinate services and will keep you informed of any changes. Your Social Worker's name and contact numbers are listed  below.  The following services may also be recommended but are not provided by the [facility] will be made to provide these services after discharge if needed.  Arrangements include referral to agencies that provide these services.  Your insurance has been verified to be:  Medicare & Commerical Your primary doctor is:  Kirsten Cox  Pertinent information will be shared with your doctor and your insurance  company.  Social Worker:  Ovidio Kin, [place] or ([phone]  Information discussed with and copy given to patient by: Elease Hashimoto, 11/04/2016, 10:05 AM

## 2016-11-04 NOTE — Progress Notes (Signed)
[facility] PHYSICAL MEDICINE & REHABILITATION     PROGRESS NOTE  Subjective/Complaints:  Pt seen laying in bed this AM.  He slept well overnight.  He is ready to begin therapies.   ROS: Denies CP, SOB, N/V/D.  Objective: Vital Signs: Blood pressure (!) 91/49, pulse 70, temperature 97.6 F (36.4 C), temperature source Oral, weight 93 kg (205 lb 0.4 oz), SpO2 100 %. Dg Chest 2 View  Result Date: 11/03/2016 CLINICAL DATA:  Shortness of breath EXAM: CHEST  2 VIEW COMPARISON:  Chest x-rays dated 10/31/2016 in 10/2015 2018. FINDINGS: Stable cardiomegaly. Median sternotomy wires appear intact and stable in alignment. Right chest wall dialysis catheter appears well positioned with tip at the level of the lower SVC/ cavoatrial junction. There is persistent mild central pulmonary vascular congestion and interstitial edema. No overt alveolar pulmonary edema. No new confluent airspace opacity to suggest a developing pneumonia. No pleural effusion or pneumothorax seen. IMPRESSION: 1. Persistent central pulmonary vascular congestion and mild bilateral interstitial edema, compatible with continued mild CHF/volume overload. 2. No evidence of pneumonia or alveolar pulmonary edema. 3. Stable cardiomegaly. Electronically Signed   By: Franki Cabot M.D.   On: 11/03/2016 21:28    Recent Labs  11/04/16 0541  WBC 7.6  HGB 8.8*  HCT 27.8*  PLT 260    Recent Labs  11/04/16 0541  NA 134*  K 5.8*  CL 96*  GLUCOSE 79  BUN 80*  CREATININE 6.66*  CALCIUM 8.2*   CBG (last 3)   Recent Labs  11/03/16 1150 11/03/16 2102 11/04/16 0625  GLUCAP 122* 98 74    Wt Readings from Last 3 Encounters:  11/04/16 93 kg (205 lb 0.4 oz)  11/03/16 92.6 kg (204 lb 2.3 oz)  10/06/16 99 kg (218 lb 5 oz)    Physical Exam:  BP (!) 91/49 (BP Location: Right Arm)   Pulse 70   Temp 97.6 F (36.4 C) (Oral)   Wt 93 kg (205 lb 0.4 oz)   SpO2 100%   BMI 31.17 kg/m  Constitutional: He appears well-developed. NAD.  Frail  HENT: Normocephalic and atraumatic.  Eyes: EOM are normal. No discharge.  Cardiovascular: Normal rate and regular rhythm.  No JVD. Respiratory: Effort normal and breath sounds normal. +[facility]: Bowel sounds are normal. He exhibits no distension.  Musculoskeletal: He exhibits no edema or tenderness.  Neurological:  Alert and oriented Motor: 4-/5 throughout Skin: Skin is warm and dry. Intact.  Psychiatric: Normal mood and behavior.  Assessment/Plan: 1. Functional deficits secondary to debility which require 3+ hours per day of interdisciplinary therapy in a comprehensive inpatient rehab setting. Physiatrist is providing close team supervision and 24 hour management of active medical problems listed below. Physiatrist and rehab team continue to assess barriers to discharge/monitor patient progress toward functional and medical goals.  Function:  Bathing Bathing position      Bathing parts      Bathing assist        Upper Body Dressing/Undressing Upper body dressing                    Upper body assist        Lower Body Dressing/Undressing Lower body dressing                                  Lower body assist        Toileting Toileting  Toileting assist     Transfers Chair/bed Clinical biochemist          Cognition Comprehension Comprehension assist level: Follows basic conversation/direction with no assist  Expression Expression assist level: Expresses complex ideas: With no assist  Social Interaction Social Interaction assist level: Interacts appropriately with others with medication or extra time (anti-anxiety, antidepressant).  Problem Solving Problem solving assist level: Solves complex problems: With extra time  Memory Memory assist level: More than reasonable amount of time    Medical Problem List and Plan: 1.  Debility secondary to CABG/Maze procedure  10/09/2016/multi-medical. Patient has sternal precautions  Begin CIR 2.  DVT Prophylaxis/Anticoagulation: Chronic Coumadin therapy for history of PAF as well as Lovenox for bridging. 3. Pain Management: Neurontin 300 mg twice a day, Ultram as needed 4. Mood: Provide emotional support 5. Neuropsych: This patient is capable of making decisions on his own behalf. 6. Skin/Wound Care: Routine skin checks 7. Fluids/Electrolytes/Nutrition: Routine I&Os 8.AKI/CKD stage 3-4. Follow-up renal services. Presently receiving hemodialysis Monday Wednesday Friday schedule monitoring for recovery of renal function.S/P right IJ tunneled dialysis catheter/right brachiocephalic AV fistula 44/03/4740 9. Acute on chronic anemia. Continue iron supplement  Hb 8.8 on 10/23  Cont to monitor 10. Dysphagia. Dysphagia #3 honey liquids. Monitor hydration.   May need IV fluids   Advance diet as tolerated 11. Chronic diastolic congestive heart failure. Monitor for any signs of fluid overload.  Filed Weights   11/04/16 0314  Weight: 93 kg (205 lb 0.4 oz)   CXR reviewed, suggesting CHF  Cont supplemental 02 12. PAF. Chronic Coumadin, Lopressor 12.5 mg twice a day, amiodarone 200 mg every 12 hours 13. Diabetes mellitus and peripheral neuropathy. Hemoglobin A1c 7.2. Levemir 28 units twice a day. Check blood sugars before meals and at bedtime.  Monitor with increased mobility 14. BPH. Proscar 5 mg daily, Flomax 0.4 mg daily 15. Hyperlipidemia. Lipitor 16. Hypothyroidism. Synthroid  LOS (Days) 1 A FACE TO FACE EVALUATION WAS PERFORMED  Berna Gitto Lorie Phenix 11/04/2016 8:11 AM

## 2016-11-04 NOTE — Evaluation (Signed)
Speech Language Pathology Assessment and Plan  Patient Details  Name: [name] MRN: [id number] Date of Birth: 1931-01-13  SLP Diagnosis: Dysphagia;Cognitive Impairments  Rehab Potential: Good ELOS: 10-14 days     Today's Date: 11/04/2016 SLP Individual Time: 0805-0900 SLP Individual Time Calculation (min): 55 min   Problem List:  Patient Active Problem List   Diagnosis Date Noted  . Shortness of breath   . Type 2 diabetes mellitus with peripheral neuropathy (HCC)   . PAF (paroxysmal atrial fibrillation) ([place])   . Debility 11/03/2016  . Coronary artery disease involving native heart without angina pectoris   . AKI (acute kidney injury) ([place])   . Dysphagia   . Chronic diastolic heart failure ([place])   . Benign prostatic hyperplasia   . Hx of CABG   . S/P CABG (coronary artery bypass graft)   . Acute blood loss anemia   . Anemia of chronic disease   . Chronic obstructive pulmonary disease ([place])   . Cardiogenic shock ([place])   . Spontaneous pneumothorax   . Acute respiratory failure with hypercapnia ([place])   . Acute renal failure ([place])   . S/P CABG x 2 + maze procedure 81/27/2018  . S/P Maze operation for atrial fibrillation 10/09/2016  . Coronary artery disease involving native coronary artery of native heart with angina pectoris ([place]) 09/30/2016  . Fusarium infection ([place]) 01/10/2016  . Dyspnea on exertion 09/12/2015  . Sepsis due to undetermined organism ([place])   . Chronic systolic CHF (congestive heart failure) ([place])   . Chronic kidney disease (CKD), stage IV (severe) ([place])   . Cellulitis of left lower extremity   . Pulmonary hypertension ([place])   . Paroxysmal atrial fibrillation (HCC)   . Other emphysema ([place])   . Bronchiectasis without complication ([place])   . Other specified hypothyroidism   . Fever 08/18/2014  . Sepsis ([place]) 08/18/2014  . CKD (chronic kidney disease) stage 4, GFR 15-29 ml/min (HCC) 08/18/2014  . Acute on chronic systolic heart failure ([place])  08/18/2014  . Bronchiectasis ([place]) 08/18/2014  . Hypothyroidism 08/18/2014  . Bronchiectasis without acute exacerbation ([place]) 12/29/2013  . LVH (left ventricular hypertrophy) due to hypertensive disease 12/10/2013  . Atrial fibrillation ([place]) 12/08/2013  . CHF (congestive heart failure) ([place]) 12/07/2013  . Chronic obstructive airway disease with asthma ([place]) 12/07/2013  . Thyroid goiter 02/01/2013  . Low TSH level 05/18/2012  . Right thyroid nodule 05/18/2012  . Diabetes ([place]) 05/18/2012  . HLD (hyperlipidemia) 05/18/2012  . Rheumatoid arthritis ([place]) 05/18/2012  . Essential hypertension 05/18/2012  . GERD (gastroesophageal reflux disease) 05/18/2012  . Anemia 05/18/2012  . Knee pain 02/12/2012   Past Medical History:  Past Medical History:  Diagnosis Date  . Anemia   . Asthma   . Atrial fibrillation ([place]) 12/08/2013  . CHF (congestive heart failure) ([place])   . Chronic joint pain    "from RA" (10/01/2016)  . CKD (chronic kidney disease), stage III ([place])   . Constipation    takes stool softener daily  . COPD (chronic obstructive pulmonary disease) ([place])   . Coronary artery disease   . Coronary artery disease involving native coronary artery of native heart with angina pectoris ([place]) 09/30/2016  . Dysrhythmia    a-fib  . GERD (gastroesophageal reflux disease)    takes Zantac daily   . History of blood transfusion    "related to one of my ORs"  . Hyperlipidemia    takes Pravastatin daily  . Hypertension    takes  Cardizem,Proscar,and Lisinopril daily  . Hypothyroidism   . Impaired hearing    wears hearing aids  . Joint swelling    "from RA" (10/01/2016)  . Peripheral neuropathy   . Pneumonia 2017  . Rheumatoid arthritis ([place])    "all over; take orencia  q 4 wks" (10/01/2016)  . S/P CABG x 2 10/09/2016   LIMA to LAD, SVG to OM, EVH via right thigh  . S/P Maze operation for atrial fibrillation 10/09/2016   Complete bilateral atrial lesion set using bipolar radiofrequency  and cryothermy ablation with clipping of LA appendage  . Type II diabetes mellitus ([place])    Past Surgical History:  Past Surgical History:  Procedure Laterality Date  . AMPUTATION TOE Left 11/08/2015   Procedure: LEFT HALLUX AMPUTATION AT THE METATARSOPHALANGEAL JOINT;  Surgeon: Wylene Simmer, MD;  Location: [facility];  Service: Orthopedics;  Laterality: Left;  . ANKLE FUSION Left 2014  . AV FISTULA PLACEMENT Right 10/28/2016   Procedure: ARTERIOVENOUS  FISTULA CREATION right arm;  Surgeon: Angelia Mould, MD;  Location: [facility];  Service: Vascular;  Laterality: Right;  . BIOPSY THYROID  2014  . CARDIAC CATHETERIZATION  09/30/2016  . CARPAL TUNNEL RELEASE Bilateral 1990's  . CATARACT EXTRACTION W/ INTRAOCULAR LENS  IMPLANT, BILATERAL Bilateral 1990's  . CLIPPING OF ATRIAL APPENDAGE  10/09/2016   Procedure: CLIPPING OF ATRIAL APPENDAGE;  Surgeon: Rexene Alberts, MD;  Location: [facility];  Service: Open Heart Surgery;;  . COLONOSCOPY    . CORONARY ARTERY BYPASS GRAFT N/A 10/09/2016   Procedure: CORONARY ARTERY BYPASS GRAFTING (CABG) x two, using left internal mammary artery and right leg greater saphenous vein harvested endoscopically;  Surgeon: Rexene Alberts, MD;  Location: [facility];  Service: Open Heart Surgery;  Laterality: N/A;  . FOREIGN BODY REMOVAL Right 09/06/2013   Procedure: REMOVAL FOREIGN BODY RIGHT INDEX FINGER;  Surgeon: Daryll Brod, MD;  Location: [facility];  Service: Orthopedics;  Laterality: Right;  ANESTHESIA: IV REGIONAL FAB  . INSERTION OF DIALYSIS CATHETER N/A 10/28/2016   Procedure: INSERTION OF DIALYSIS CATHETER;  Surgeon: Angelia Mould, MD;  Location: [facility];  Service: Vascular;  Laterality: N/A;  . JOINT REPLACEMENT    . MAZE N/A 10/09/2016   Procedure: MAZE;  Surgeon: Rexene Alberts, MD;  Location: [facility];  Service: Open Heart Surgery;  Laterality: N/A;  . REPAIR TENDONS FOOT Left 1946; 1984   "farming accident; repaired  tendons both times"  . RIGHT/LEFT HEART CATH AND CORONARY ANGIOGRAPHY N/A 09/30/2016   Procedure: RIGHT/LEFT HEART CATH AND CORONARY ANGIOGRAPHY;  Surgeon: Nigel Mormon, MD;  Location: [facility];  Service: Cardiovascular;  Laterality: N/A;  . SHOULDER ARTHROSCOPY W/ ROTATOR CUFF REPAIR Right 1990's  . TEE WITHOUT CARDIOVERSION N/A 10/09/2016   Procedure: TRANSESOPHAGEAL ECHOCARDIOGRAM (TEE);  Surgeon: Rexene Alberts, MD;  Location: [facility];  Service: Open Heart Surgery;  Laterality: N/A;  . THYROIDECTOMY Right 02/01/2013   Procedure: RIGHT THYROIDECTOMY LOBECTOMY;  Surgeon: Earnstine Regal, MD;  Location: [facility];  Service: General;  Laterality: Right;  . TONSILLECTOMY  1930's  . TOTAL KNEE ARTHROPLASTY Right 12/16/2010   Procedure: TOTAL KNEE ARTHROPLASTY; rt Surgeon: Rudean Haskell, MD;  Location: [facility];  Service: Orthopedics;  Laterality: Right;  . TOTAL SHOULDER REPLACEMENT Left 03/2010    Assessment / Plan / Recommendation Clinical Impression   AKRAM KISSICK is a 83 y.o.right handed male with history of hypertension, chronic diastolic congestive  heart failure, stage IV chronic renal insufficiency, recurrent PAF on long-term anticoagulation, COPD with remote tobacco abuse, rheumatoid arthritis and diabetes mellitus. Per chart review and family, patient lives with spouse. Independent prior to admission. One level home with ramp entrance. Presented 10/08/2016 with progressive shortness of breath. Noninvasive echocardiogram with ejection fraction of ~45% no signs of ischemia. Elective cardiac catheterization with noted CAD. Underwent CABG/MAZE procedure 10/09/2016 per Dr. Roxy Manns. Hospital course pain management. Sternal precautions as directed. Nephrology consulted for AKI on CKD. Patient has been receiving hemodialysis Monday Wednesday Friday schedule and monitoring for possible recovery of renal function with latest creatinine 2.99 and plan for ongoing hemodialysis as outpatient . Patient did  undergo right IJ tunneled dialysis catheter 62/37/6283/TDVVO brachiocephalic AV fistula per Dr. Scot Dock. Chronic Coumadin therapy has been resumed. Acute on chronic anemia 10.2 and monitored.[name] MBS completed 10/29/2016 currently on a dysphagia #3 honey thick liquid diet. Physical and occupational therapy evaluations completed with recommendations of physical medicine rehabilitation consult. Patient was admitted for a comprehensive rehabilitation program.  SLP evaluation was completed on 11/04/2016 with the following results:  Pt presents wtih s/s of a moderate dysphagia characterized by immediate coughing wtih ice chips and thin liquids in ~50% of trials which appears improved in comparison to MBS on 10/17 as penetration was silent on study.  S/s of aspiration were mitigated when consuming honey thick liquids.  As a result, would recommend that pt remain on dys 3 solids and honey thick liquids with small amounts of ice chips in between meals after thorough oral care.  Will continue working towards repeat objective assessment.   Pt also presents with mild, higher level cognitive deficits characterized by decreased recall of new information and decreased functional problem solving.   As a result, pt would benefit from skilled ST while inpatient in order to maximize functional independence and reduce burden of care prior to discharge.  Anticipate that pt will need 24/7 supervision at discharge in addition to [employer] follow up at next level of care.    Skilled Therapeutic Interventions          Cognitive-linguistic evaluation completed with results and recommendations reviewed with patient and family.  Pt needed max assist on 4 word delayed subtest of Cognistat to recall 3 out of 4 targeted words.  He was able to sustain his attention to tasks appropriately and demonstrated better functional recall of personally relevant information with only min-mod assist question cues.  Pt reports that he hasn't been sleeping well  since being in the hospital which could contribute to his current presentation cognitively.  Discussed recommendations for ST interventions while inpatient with pt and his son in law.  All questions were answered to their satisfaction at this time.      SLP Assessment  Patient will need skilled Speech Lanaguage Pathology Services during CIR admission    Recommendations  SLP Diet Recommendations: Dysphagia 3 (Mech soft);Honey Liquid Administration via: Cup Medication Administration: Whole meds with puree Supervision: Patient able to self feed;Full supervision/cueing for compensatory strategies Compensations: Slow rate;Small sips/bites;Hard cough after swallow;Multiple dry swallows after each bite/sip Postural Changes and/or Swallow Maneuvers: Seated upright 90 degrees Oral Care Recommendations: Oral care BID Patient destination: Home Follow up Recommendations: 24 hour supervision/assistance;Home Health SLP;Outpatient SLP Equipment Recommended: To be determined    SLP Frequency 3 to 5 out of 7 days   SLP Duration  SLP Intensity  SLP Treatment/Interventions 10-14 days   Minumum of 1-2 x/day, 30 to 90 minutes  Cognitive remediation/compensation;Cueing  hierarchy;Functional tasks;Dysphagia/aspiration precaution training;Internal/external aids;Patient/family education    Pain Pain Assessment Pain Assessment: 0-10 Faces Pain Scale: Hurts worst Pain Type: Acute pain Pain Location: Arm Pain Orientation: Right Pain Intervention(s): Other (Comment) (RN already aware, meds administered during eval)  Prior Functioning Cognitive/Linguistic Baseline: Within functional limits Type of Home: (P) House  Lives With: (P) Spouse Available Help at Discharge: (P) Family;Available 24 hours/day Vocation: (P) Part time employment  Function:  Eating Eating   Modified Consistency Diet: Yes Eating Assist Level: Set up assist for;Supervision or verbal cues   Eating Set Up Assist For: Opening  containers       Cognition Comprehension Comprehension assist level: Follows basic conversation/direction with no assist  Expression   Expression assist level: Expresses basic needs/ideas: With no assist  Social Interaction Social Interaction assist level: Interacts appropriately with others with medication or extra time (anti-anxiety, antidepressant).  Problem Solving Problem solving assist level: Solves basic 75 - 89% of the time/requires cueing 10 - 24% of the time  Memory Memory assist level: Recognizes or recalls 75 - 89% of the time/requires cueing 10 - 24% of the time   Short Term Goals: Week 1: SLP Short Term Goal 1 (Week 1): Pt will consume trials of ice chips with minimal overt s/s of aspiration and supervision cues for use of swallowing precautions over 3 consecutive sessions prior to repeat objective assessment.  SLP Short Term Goal 2 (Week 1): Pt will utilize external aids to facilitate recall of daily information with supervision verbal cues.   SLP Short Term Goal 3 (Week 1): Pt will complete semi-complex tasks with supervision verbal cues for functional problem solving.    Refer to Care Plan for Long Term Goals  Recommendations for other services: None   Discharge Criteria: Patient will be discharged from SLP if patient refuses treatment 3 consecutive times without medical reason, if treatment goals not met, if there is a change in medical status, if patient makes no progress towards goals or if patient is discharged from hospital.  The above assessment, treatment plan, treatment alternatives and goals were discussed and mutually agreed upon: by patient and by family  Emilio Math 11/04/2016, 12:41 PM

## 2016-11-04 NOTE — Progress Notes (Signed)
Physical Therapy Assessment and Plan  Patient Details  Name: [name] MRN: [id number] Date of Birth: October 30, 1930  PT Diagnosis: Difficulty walking and Muscle weakness Rehab Potential: Good ELOS: 10-14 days    Today's Date: 11/04/2016 PT Individual Time: 1340-1425 PT Individual Time Calculation (min): 45 min    Problem List:  Patient Active Problem List   Diagnosis Date Noted  . Shortness of breath   . Type 2 diabetes mellitus with peripheral neuropathy (HCC)   . PAF (paroxysmal atrial fibrillation) ([place])   . Debility 11/03/2016  . Coronary artery disease involving native heart without angina pectoris   . AKI (acute kidney injury) ([place])   . Dysphagia   . Chronic diastolic heart failure ([place])   . Benign prostatic hyperplasia   . Hx of CABG   . S/P CABG (coronary artery bypass graft)   . Acute blood loss anemia   . Anemia of chronic disease   . Chronic obstructive pulmonary disease ([place])   . Cardiogenic shock ([place])   . Spontaneous pneumothorax   . Acute respiratory failure with hypercapnia ([place])   . Acute renal failure ([place])   . S/P CABG x 2 + maze procedure 10/09/2016  . S/P Maze operation for atrial fibrillation 10/09/2016  . Coronary artery disease involving native coronary artery of native heart with angina pectoris ([place]) 09/30/2016  . Fusarium infection ([place]) 01/10/2016  . Dyspnea on exertion 09/12/2015  . Sepsis due to undetermined organism ([place])   . Chronic systolic CHF (congestive heart failure) ([place])   . Chronic kidney disease (CKD), stage IV (severe) ([place])   . Cellulitis of left lower extremity   . Pulmonary hypertension ([place])   . Paroxysmal atrial fibrillation (HCC)   . Other emphysema ([place])   . Bronchiectasis without complication ([place])   . Other specified hypothyroidism   . Fever 08/18/2014  . Sepsis ([place]) 08/18/2014  . CKD (chronic kidney disease) stage 4, GFR 15-29 ml/min (HCC) 08/18/2014  . Acute on chronic systolic heart failure ([place]) 08/18/2014  .  Bronchiectasis ([place]) 08/18/2014  . Hypothyroidism 08/18/2014  . Bronchiectasis without acute exacerbation ([place]) 12/29/2013  . LVH (left ventricular hypertrophy) due to hypertensive disease 12/10/2013  . Atrial fibrillation ([place]) 12/08/2013  . CHF (congestive heart failure) ([place]) 12/07/2013  . Chronic obstructive airway disease with asthma ([place]) 12/07/2013  . Thyroid goiter 02/01/2013  . Low TSH level 05/18/2012  . Right thyroid nodule 05/18/2012  . Diabetes ([place]) 05/18/2012  . HLD (hyperlipidemia) 05/18/2012  . Rheumatoid arthritis ([place]) 05/18/2012  . Essential hypertension 05/18/2012  . GERD (gastroesophageal reflux disease) 05/18/2012  . Anemia 05/18/2012  . Knee pain 02/12/2012    Past Medical History:  Past Medical History:  Diagnosis Date  . Anemia   . Asthma   . Atrial fibrillation ([place]) 12/08/2013  . CHF (congestive heart failure) ([place])   . Chronic joint pain    "from RA" (10/01/2016)  . CKD (chronic kidney disease), stage III ([place])   . Constipation    takes stool softener daily  . COPD (chronic obstructive pulmonary disease) ([place])   . Coronary artery disease   . Coronary artery disease involving native coronary artery of native heart with angina pectoris ([place]) 09/30/2016  . Dysrhythmia    a-fib  . GERD (gastroesophageal reflux disease)    takes Zantac daily   . History of blood transfusion    "related to one of my ORs"  . Hyperlipidemia    takes Pravastatin daily  . Hypertension  takes Cardizem,Proscar,and Lisinopril daily  . Hypothyroidism   . Impaired hearing    wears hearing aids  . Joint swelling    "from RA" (10/01/2016)  . Peripheral neuropathy   . Pneumonia 2017  . Rheumatoid arthritis ([place])    "all over; take orencia  q 4 wks" (10/01/2016)  . S/P CABG x 2 10/09/2016   LIMA to LAD, SVG to OM, EVH via right thigh  . S/P Maze operation for atrial fibrillation 10/09/2016   Complete bilateral atrial lesion set using bipolar radiofrequency and cryothermy  ablation with clipping of LA appendage  . Type II diabetes mellitus ([place])    Past Surgical History:  Past Surgical History:  Procedure Laterality Date  . AMPUTATION TOE Left 11/08/2015   Procedure: LEFT HALLUX AMPUTATION AT THE METATARSOPHALANGEAL JOINT;  Surgeon: Wylene Simmer, MD;  Location: [facility];  Service: Orthopedics;  Laterality: Left;  . ANKLE FUSION Left 2014  . AV FISTULA PLACEMENT Right 10/28/2016   Procedure: ARTERIOVENOUS  FISTULA CREATION right arm;  Surgeon: Angelia Mould, MD;  Location: [facility];  Service: Vascular;  Laterality: Right;  . BIOPSY THYROID  2014  . CARDIAC CATHETERIZATION  09/30/2016  . CARPAL TUNNEL RELEASE Bilateral 1990's  . CATARACT EXTRACTION W/ INTRAOCULAR LENS  IMPLANT, BILATERAL Bilateral 1990's  . CLIPPING OF ATRIAL APPENDAGE  10/09/2016   Procedure: CLIPPING OF ATRIAL APPENDAGE;  Surgeon: Rexene Alberts, MD;  Location: [facility];  Service: Open Heart Surgery;;  . COLONOSCOPY    . CORONARY ARTERY BYPASS GRAFT N/A 10/09/2016   Procedure: CORONARY ARTERY BYPASS GRAFTING (CABG) x two, using left internal mammary artery and right leg greater saphenous vein harvested endoscopically;  Surgeon: Rexene Alberts, MD;  Location: [facility];  Service: Open Heart Surgery;  Laterality: N/A;  . FOREIGN BODY REMOVAL Right 09/06/2013   Procedure: REMOVAL FOREIGN BODY RIGHT INDEX FINGER;  Surgeon: Daryll Brod, MD;  Location: [facility];  Service: Orthopedics;  Laterality: Right;  ANESTHESIA: IV REGIONAL FAB  . INSERTION OF DIALYSIS CATHETER N/A 10/28/2016   Procedure: INSERTION OF DIALYSIS CATHETER;  Surgeon: Angelia Mould, MD;  [facility];  Service: Vascular;  Laterality: N/A;  . JOINT REPLACEMENT    . MAZE N/A 10/09/2016   Procedure: MAZE;  Surgeon: Rexene Alberts, MD;  Location: [facility];  Service: Open Heart Surgery;  Laterality: N/A;  . REPAIR TENDONS FOOT Left 1946; 1984   "farming accident; repaired tendons both times"   . RIGHT/LEFT HEART CATH AND CORONARY ANGIOGRAPHY N/A 09/30/2016   Procedure: RIGHT/LEFT HEART CATH AND CORONARY ANGIOGRAPHY;  Surgeon: Nigel Mormon, MD;  Location: [facility];  Service: Cardiovascular;  Laterality: N/A;  . SHOULDER ARTHROSCOPY W/ ROTATOR CUFF REPAIR Right 1990's  . TEE WITHOUT CARDIOVERSION N/A 10/09/2016   Procedure: TRANSESOPHAGEAL ECHOCARDIOGRAM (TEE);  Surgeon: Rexene Alberts, MD;  Location: [facility];  Service: Open Heart Surgery;  Laterality: N/A;  . THYROIDECTOMY Right 02/01/2013   Procedure: RIGHT THYROIDECTOMY LOBECTOMY;  Surgeon: Earnstine Regal, MD;  Location: [facility];  Service: General;  Laterality: Right;  . TONSILLECTOMY  1930's  . TOTAL KNEE ARTHROPLASTY Right 12/16/2010   Procedure: TOTAL KNEE ARTHROPLASTY; rt Surgeon: Rudean Haskell, MD;  Location: [facility];  Service: Orthopedics;  Laterality: Right;  . TOTAL SHOULDER REPLACEMENT Left 03/2010    Assessment & Plan Clinical Impression: Patient is a 83 y.o.right handed malewith history of hypertension, chronic diastolic congestive heart failure, stage IV chronic renal  insufficiency, recurrent PAF on long-term anticoagulation, COPD with remote tobacco abuse, rheumatoid arthritis and diabetes mellitus. Per chart review and family,patient lives with spouse. Independent prior to admission. One level home with ramp entrance.Presented 10/08/2016 with progressive shortness of breath. Noninvasive echocardiogram with ejection fraction of ~45% no signs of ischemia. Elective cardiac catheterization with noted CAD. Underwent CABG/MAZE procedure 10/09/2016 per Dr. Roxy Manns. Hospital course pain management. Sternal precautions as directed. Nephrology consulted for AKI on CKD. Patient has been receiving hemodialysis Monday Wednesday Friday schedule and monitoring for possible recovery of renal function with latest creatinine 2.99 and plan for ongoing hemodialysis as outpatient . Patient did undergo right IJ tunneled dialysis  catheter 78/24/2353/IRWER brachiocephalic AV fistula per Dr. Scot Dock. Chronic Coumadin therapy has been resumed. Acute on chronic anemia 10.2 and monitored.[name] MBS completed 10/29/2016 currently on a dysphagia #3 honey thick liquid diet. Patient transferred to CIR on 11/03/2016 .   Patient currently requires max with mobility secondary to muscle weakness and decreased cardiorespiratoy endurance and decreased oxygen support.  Prior to hospitalization, patient was independent  with mobility and lived with Spouse in a House home.  Home access is  Ramped entrance.  Patient will benefit from skilled PT intervention to maximize safe functional mobility, minimize fall risk and decrease caregiver burden for planned discharge home with 24 hour assist.  Anticipate patient will benefit from follow up [facility] at discharge.  PT - End of Session Activity Tolerance: Tolerates < 10 min activity with changes in vital signs Endurance Deficit: Yes Endurance Deficit Description: Decreased, on 2 L/min supplemental O2 and O2 sats 90-97% PT Assessment Rehab Potential (ACUTE/IP ONLY): Good PT Barriers to Discharge: [place] home environment;Home environment access/layout;Weight bearing restrictions PT Barriers to Discharge Comments: WB restrictions - sternal precautions. 5 adult children very involved, lives w/ wife who has memory issues.  PT Patient demonstrates impairments in the following area(s): Behavior;Balance;Endurance;Pain;Safety PT Transfers Functional Problem(s): Bed Mobility;Bed to Chair;Car;Furniture;Floor PT Locomotion Functional Problem(s): Ambulation;Wheelchair Mobility;Stairs PT Plan PT Intensity: Minimum of 1-2 x/day ,45 to 90 minutes PT Frequency: 5 out of 7 days PT Duration Estimated Length of Stay: 10-14 days  PT Treatment/Interventions: Ambulation/gait training;Community reintegration;DME/adaptive equipment instruction;Neuromuscular re-education;Stair training;Psychosocial support;UE/LE Strength  taining/ROM;UE/LE Coordination activities;Wheelchair propulsion/positioning;Therapeutic Activities;Skin care/wound management;Pain management;Functional electrical stimulation;Discharge planning;Balance/vestibular training;Cognitive remediation/compensation;Disease management/prevention;Functional mobility training;Patient/family education;Splinting/orthotics;Therapeutic Exercise;Visual/perceptual remediation/compensation PT Transfers Anticipated Outcome(s): Min A  PT Locomotion Anticipated Outcome(s): Min A  PT Recommendation Follow Up Recommendations: Home health PT Patient destination: Home Equipment Recommended: To be determined  Skilled Therapeutic Intervention  Pt asleep in w/c upon arrival and agreeable to therapy, no c/o pain. He was initially difficult to arouse and required frequent reminders to keep eyes open throughout session and to answer questions. Pt requesting to return to bed, however agreeable to begin evaluation while in w/c and go from there. After discussing PLOF, D/C plan, and home set-up w/ pt, wife, and pt's daughter, pt refused to perform any OOB activity and wanted to go to sleep stating he feels "exhausted". Pt w/ increased anxiety about leaving the room and moderate increase in work of breathing, O2 sats remained 90-97% on 2 L/min supplemental O2. Had multiple sit<>stand attempts using RW and transferred to EOB w/ Max A. Pt unable to remain sitting at EOB and quickly transitioned into supine w/ Max A for LE management. Pt performed multiple rolls w/ Mod A for adjusting in bed and ended session in supine in care of family members. PT, wife, and daughter instructed patient in PT Evaluation and initiated  treatment intervention; see below for results. Also educated them in [place], rehab potential, rehab goals, and discharge recommendations.    PT Evaluation Precautions/Restrictions Precautions Precautions: Fall;Sternal Restrictions Weight Bearing Restrictions: Yes (sternal  preacutions) RUE Weight Bearing: Weight bearing as tolerated General   Vital SignsTherapy Vitals Temp: 97.9 F (36.6 C) Temp Source: Oral Pulse Rate: 71 Resp: 18 BP: (!) 94/51 Patient Position (if appropriate): Lying Oxygen Therapy SpO2: 97 % O2 Device: Nasal Cannula O2 Flow Rate (L/min): 2 L/min Pain Pain Assessment Pain Assessment: No/denies pain Faces Pain Scale: Hurts worst Pain Type: Acute pain Pain Location: Arm Pain Orientation: Right Pain Intervention(s): Other (Comment) (RN already aware, meds administered during eval) Home Living/Prior [place]: Spouse/significant other Available Help at Discharge: Family;Available 24 hours/day Type of Home: House Home Access: Ramped entrance Home Layout: One level Bathroom Shower/Tub: Multimedia programmer: Handicapped height Bathroom Accessibility: Yes  Lives With: Spouse Prior Function Level of Independence: Independent with basic ADLs;Independent with homemaking with ambulation  Able to Take Stairs?: Yes Driving: Yes Vocation: Part time employment Vocation Requirements: works 1 day a week Comments: Pt used rollator or cane PTA.  Has bad ankles per family Vision/Perception  Perception Perception: Within Functional Limits Praxis Praxis: Intact  Cognition Overall Cognitive Status: Impaired/Different from baseline Arousal/Alertness: Awake/alert Orientation Level: Oriented X4 Attention: Sustained Sustained Attention: Appears intact Memory: Impaired Memory Impairment: Decreased recall of new information Awareness: Appears intact Problem Solving: Impaired Problem Solving Impairment: Functional complex Behaviors: Other (comment) (lethargic ) Safety/Judgment: Appears intact Sensation Sensation Light Touch: Appears Intact Proprioception: Appears Intact Coordination Gross Motor Movements are Fluid and Coordinated: No Fine Motor Movements are Fluid and Coordinated:  Yes Motor  Motor Motor: Other (comment) Motor - Skilled Clinical Observations: Generalized weakness  Mobility Bed Mobility Bed Mobility: Rolling Right;Supine to Sit Rolling Right: 2: Max assist Rolling Right Details: Manual facilitation for placement;Verbal cues for technique Supine to Sit: 2: Max assist Supine to Sit Details: Verbal cues for technique;Manual facilitation for placement Sit to Supine: 3: Mod assist Sit to Supine - Details: Manual facilitation for weight shifting;Verbal cues for precautions/safety;Verbal cues for technique Transfers Transfers: Yes Sit to Stand: 2: Max assist Sit to Stand Details: Verbal cues for technique;Manual facilitation for placement;Verbal cues for precautions/safety Stand to Sit: 2: Max assist Stand to Sit Details (indicate cue type and reason): Verbal cues for technique;Manual facilitation for placement;Verbal cues for precautions/safety Stand Pivot Transfers: 2: Max assist Stand Pivot Transfer Details: Verbal cues for technique;Manual facilitation for placement;Verbal cues for precautions/safety Locomotion  Ambulation Ambulation: No (pt refused all OOB activity during eval) Gait Gait: No Stairs / Additional Locomotion Stairs: No Wheelchair Mobility Wheelchair Mobility: No  Trunk/Postural Assessment  Cervical Assessment Cervical Assessment: Within Functional Limits Thoracic Assessment Thoracic Assessment: Within Functional Limits Lumbar Assessment Lumbar Assessment: Exceptions to [facility] (posterior pelvic tilt ) Postural Control Postural Control: Within Functional Limits  Balance Balance Balance Assessed: Yes Static Sitting Balance Static Sitting - Balance Support: No upper extremity supported Static Sitting - Level of Assistance: 4: Min assist Static Standing Balance Static Standing - Balance Support: During functional activity Static Standing - Level of Assistance: 3: Mod assist Dynamic Standing Balance Dynamic Standing - Balance  Support: During functional activity Dynamic Standing - Level of Assistance: 3: Mod assist Extremity Assessment  RUE Assessment RUE Assessment: Not tested (2/2 stenal precautions, fnuctional grip strength) LUE Assessment LUE Assessment: Not tested (2/2 stenal precautions, functional grip strength) RLE Assessment RLE Assessment: Exceptions to [facility] (3+  to 4/5 globally) LLE Assessment LLE Assessment: Exceptions to [facility] (3+ to 4/5 globally)   See Function Navigator for Current Functional Status.   Refer to Care Plan for Long Term Goals  Recommendations for other services: None   Discharge Criteria: Patient will be discharged from PT if patient refuses treatment 3 consecutive times without medical reason, if treatment goals not met, if there is a change in medical status, if patient makes no progress towards goals or if patient is discharged from hospital.  The above assessment, treatment plan, treatment alternatives and goals were discussed and mutually agreed upon: by patient and by family  Deshannon Seide K Arnette 11/04/2016, 3:02 PM

## 2016-11-04 NOTE — Progress Notes (Signed)
ANTICOAGULATION CONSULT NOTE - Initial Consult  Pharmacy Consult for Warfarin Indication: atrial fibrillation  No Known Allergies  Patient Measurements: Weight: 205 lb 0.4 oz (93 kg)  Vital Signs: Temp: 97.6 F (36.4 C) (10/23 0252) Temp Source: Oral (10/23 0252) BP: 91/49 (10/23 0252) Pulse Rate: 70 (10/23 0252)  Labs:  Recent Labs  11/02/16 0426 11/03/16 0211 11/04/16 0541  HGB  --   --  8.8*  HCT  --   --  27.8*  PLT  --   --  260  LABPROT 16.5* 19.3* 23.8*  INR 1.35 1.65 2.15  CREATININE  --   --  6.66*    Estimated Creatinine Clearance: 9.1 mL/min (A) (by C-G formula based on SCr of 6.66 mg/dL (H)).   Assessment: 6 YOM transferred to CIR on 10/22 after a prolonged hospital stay that included CABG/MAZE with AKI and new ESRD. The patient has a hx of Afib and was switched from Apixaban to Warfarin during the last admission  INR 1.65 > 2.15, therapeutic, PO intake seems improving, amiodarone dose decreased to 200 mg BID, cbc stable, No bleeding noted per chart. Pt is also on lovenox 30   Goal of Therapy:  INR 2-3 Monitor platelets by anticoagulation protocol: Yes   Plan:  1. Warfarin 5 mg x 1 dose at 1800 today 2. D/c lovenox since INR is therapeutic now 3. Will continue to monitor for any signs/symptoms of bleeding and will follow up with PT/INR in the a.m.   Thank you for allowing pharmacy to be a part of this patient's care.  Maryanna Shape, PharmD, BCPS  Clinical Pharmacist  Pager: [phone]   11/04/2016 1:50 PM

## 2016-11-04 NOTE — Discharge Instructions (Addendum)
Inpatient Rehab Discharge Instructions  [name] Discharge date and time: No discharge date for patient encounter.   Activities/Precautions/ Functional Status: Activity: activity as tolerated Diet: Mechanical soft nectar liquids 1200 mL fluid restriction Wound Care: keep wound clean and dry Functional status:  ___ No restrictions     ___ Walk up steps independently ___ 24/7 supervision/assistance   ___ Walk up steps with assistance ___ Intermittent supervision/assistance  ___ Bathe/dress independently ___ Walk with walker     _x__ Bathe/dress with assistance ___ Walk Independently    ___ Shower independently ___ Walk with assistance    ___ Shower with assistance ___ No alcohol     ___ Return to work/school ________  Special Instructions: Continue hemodialysis as directed   Home health nurse to check INR 11/25/2016 results to [facility] [phone] fax number [phone]   Cleanse buttocks with soap and water apply GERHARDT cream twice daily and leave open to air   [place]:    Home Health:   PT, OT, RN     [place]  Date of last service:11/21/2016  Medical Equipment/Items Ordered:TRANSPORT CHAIR, [place] Tuesday, Thursday & Saturday CHAIR TIME 5:50 AM     My questions have been answered and I understand these instructions. I will adhere to these goals and the provided educational materials after my discharge from the hospital.  Patient/Caregiver Signature _______________________________ Date __________  Clinician Signature _______________________________________ Date __________  Please bring this form and your medication list with you to all your follow-up doctor's appointments. Information on my medicine - Coumadin   (Warfarin)  This medication education was reviewed  with me or my healthcare representative as part of my discharge preparation.    Why was Coumadin prescribed for you? Coumadin was prescribed for you because you have a blood clot or a medical condition that can cause an increased risk of forming blood clots. Blood clots can cause serious health problems by blocking the flow of blood to the heart, lung, or brain. Coumadin can prevent harmful blood clots from forming. As a reminder your indication for Coumadin is:   Stroke Prevention Because Of Atrial Fibrillation  What test will check on my response to Coumadin? While on Coumadin (warfarin) you will need to have an INR test regularly to ensure that your dose is keeping you in the desired range. The INR (international normalized ratio) number is calculated from the result of the laboratory test called prothrombin time (PT).  If an INR APPOINTMENT HAS NOT ALREADY BEEN MADE FOR YOU please schedule an appointment to have this lab work done by your health care provider within 7 days. Your INR goal is usually a number between:  2 to 3 or your provider may give you a more narrow range like 2-2.5.  Ask your health care provider during an office visit what your goal INR is.  What  do you need to  know  About  COUMADIN? Take Coumadin (warfarin) exactly as prescribed by your healthcare provider about the same time each day.  DO NOT stop taking without talking to the doctor who prescribed the medication.  Stopping without other blood clot prevention medication to take the place of Coumadin may increase your risk of developing a new clot or stroke.  Get refills before you run out.  What do you do if you miss a dose? If you  miss a dose, take it as soon as you remember on the same day then continue your regularly scheduled regimen the next day.  Do not take two doses of Coumadin at the same time.  Important Safety Information A possible side effect of Coumadin (Warfarin) is an increased risk of bleeding. You  should call your healthcare provider right away if you experience any of the following: ? Bleeding from an injury or your nose that does not stop. ? Unusual colored urine (red or dark brown) or unusual colored stools (red or black). ? Unusual bruising for unknown reasons. ? A serious fall or if you hit your head (even if there is no bleeding).  Some foods or medicines interact with Coumadin (warfarin) and might alter your response to warfarin. To help avoid this: ? Eat a balanced diet, maintaining a consistent amount of Vitamin K. ? Notify your provider about major diet changes you plan to make. ? Avoid alcohol or limit your intake to 1 drink for women and 2 drinks for men per day. (1 drink is 5 oz. wine, 12 oz. beer, or 1.5 oz. liquor.)  Make sure that ANY health care provider who prescribes medication for you knows that you are taking Coumadin (warfarin).  Also make sure the healthcare provider who is monitoring your Coumadin knows when you have started a new medication including herbals and non-prescription products.  Coumadin (Warfarin)  Major Drug Interactions  Increased Warfarin Effect Decreased Warfarin Effect  Alcohol (large quantities) Antibiotics (esp. Septra/Bactrim, Flagyl, Cipro) Amiodarone (Cordarone) Aspirin (ASA) Cimetidine (Tagamet) Megestrol (Megace) NSAIDs (ibuprofen, naproxen, etc.) Piroxicam (Feldene) Propafenone (Rythmol SR) Propranolol (Inderal) Isoniazid (INH) Posaconazole (Noxafil) Barbiturates (Phenobarbital) Carbamazepine (Tegretol) Chlordiazepoxide (Librium) Cholestyramine (Questran) Griseofulvin Oral Contraceptives Rifampin Sucralfate (Carafate) Vitamin K   Coumadin (Warfarin) Major Herbal Interactions  Increased Warfarin Effect Decreased Warfarin Effect  Garlic Ginseng Ginkgo biloba Coenzyme Q10 Green tea St. Johns wort    Coumadin (Warfarin) FOOD Interactions  Eat a consistent number of servings per week of foods HIGH in Vitamin K (1  serving =  cup)  Collards (cooked, or boiled & drained) Kale (cooked, or boiled & drained) Mustard greens (cooked, or boiled & drained) Parsley *serving size only =  cup Spinach (cooked, or boiled & drained) Swiss chard (cooked, or boiled & drained) Turnip greens (cooked, or boiled & drained)  Eat a consistent number of servings per week of foods MEDIUM-HIGH in Vitamin K (1 serving = 1 cup)  Asparagus (cooked, or boiled & drained) Broccoli (cooked, boiled & drained, or raw & chopped) Brussel sprouts (cooked, or boiled & drained) *serving size only =  cup Lettuce, raw (green leaf, endive, romaine) Spinach, raw Turnip greens, raw & chopped   These websites have more information on Coumadin (warfarin):  FailFactory.se; VeganReport.com.au;

## 2016-11-05 ENCOUNTER — Inpatient Hospital Stay (HOSPITAL_COMMUNITY): Payer: Medicare Other

## 2016-11-05 ENCOUNTER — Ambulatory Visit (HOSPITAL_COMMUNITY): Payer: Medicare Other | Admitting: Speech Pathology

## 2016-11-05 LAB — CBC
HEMATOCRIT: 27.8 % — AB (ref 39.0–52.0)
HEMOGLOBIN: 8.9 g/dL — AB (ref 13.0–17.0)
MCH: 29 pg (ref 26.0–34.0)
MCHC: 32 g/dL (ref 30.0–36.0)
MCV: 90.6 fL (ref 78.0–100.0)
Platelets: 305 10*3/uL (ref 150–400)
RBC: 3.07 MIL/uL — ABNORMAL LOW (ref 4.22–5.81)
RDW: 16.3 % — ABNORMAL HIGH (ref 11.5–15.5)
WBC: 8.3 10*3/uL (ref 4.0–10.5)

## 2016-11-05 LAB — COMPREHENSIVE METABOLIC PANEL
ALBUMIN: 2.3 g/dL — AB (ref 3.5–5.0)
ALK PHOS: 103 U/L (ref 38–126)
ALT: 23 U/L (ref 17–63)
ANION GAP: 18 — AB (ref 5–15)
AST: 17 U/L (ref 15–41)
BILIRUBIN TOTAL: 0.9 mg/dL (ref 0.3–1.2)
BUN: 98 mg/dL — AB (ref 6–20)
CALCIUM: 8.3 mg/dL — AB (ref 8.9–10.3)
CO2: 22 mmol/L (ref 22–32)
Chloride: 93 mmol/L — ABNORMAL LOW (ref 101–111)
Creatinine, Ser: 7.69 mg/dL — ABNORMAL HIGH (ref 0.61–1.24)
GFR calc Af Amer: 7 mL/min — ABNORMAL LOW (ref >60)
GFR, EST NON AFRICAN AMERICAN: 6 mL/min — AB (ref >60)
GLUCOSE: 112 mg/dL — AB (ref 65–99)
Potassium: 6 mmol/L — ABNORMAL HIGH (ref 3.5–5.1)
Sodium: 133 mmol/L — ABNORMAL LOW (ref 135–145)
TOTAL PROTEIN: 5.5 g/dL — AB (ref 6.5–8.1)

## 2016-11-05 LAB — PROTIME-INR
INR: 2.64
Prothrombin Time: 28 s — ABNORMAL HIGH (ref 11.4–15.2)

## 2016-11-05 LAB — GLUCOSE, CAPILLARY
GLUCOSE-CAPILLARY: 120 mg/dL — AB (ref 65–99)
GLUCOSE-CAPILLARY: 92 mg/dL (ref 65–99)
GLUCOSE-CAPILLARY: 114 mg/dL — AB (ref 65–99)

## 2016-11-05 LAB — MAGNESIUM: Magnesium: 2.1 mg/dL (ref 1.7–2.4)

## 2016-11-05 LAB — IRON AND TIBC
Iron: 21 ug/dL — ABNORMAL LOW (ref 45–182)
Saturation Ratios: 11 % — ABNORMAL LOW (ref 17.9–39.5)
TIBC: 190 ug/dL — ABNORMAL LOW (ref 250–450)
UIBC: 169 ug/dL

## 2016-11-05 LAB — PHOSPHORUS: PHOSPHORUS: 6.9 mg/dL — AB (ref 2.5–4.6)

## 2016-11-05 MED ORDER — ACETAMINOPHEN 325 MG PO TABS
ORAL_TABLET | ORAL | Status: AC
  Administered 2016-11-05: 650 mg via ORAL
  Filled 2016-11-05: qty 2

## 2016-11-05 MED ORDER — HEPARIN SODIUM (PORCINE) 1000 UNIT/ML DIALYSIS
100.0000 [IU]/kg | INTRAMUSCULAR | Status: DC | PRN
  Administered 2016-11-05: 9300 [IU] via INTRAVENOUS_CENTRAL
  Filled 2016-11-05 (×2): qty 10

## 2016-11-05 MED ORDER — LIDOCAINE HCL (PF) 1 % IJ SOLN: 5.0000 mL | INTRAMUSCULAR | Status: DC | PRN

## 2016-11-05 MED ORDER — WARFARIN SODIUM 5 MG PO TABS
2.5000 mg | ORAL_TABLET | Freq: Once | ORAL | Status: AC
  Administered 2016-11-05: 2.5 mg via ORAL
  Filled 2016-11-05: qty 1

## 2016-11-05 MED ORDER — DARBEPOETIN ALFA 200 MCG/0.4ML IJ SOSY
PREFILLED_SYRINGE | INTRAMUSCULAR | Status: AC
  Administered 2016-11-05: 200 ug via INTRAVENOUS
  Filled 2016-11-05: qty 0.4

## 2016-11-05 MED ORDER — HEPARIN SODIUM (PORCINE) 1000 UNIT/ML DIALYSIS: 1000.0000 [IU] | INTRAMUSCULAR | Status: DC | PRN

## 2016-11-05 MED ORDER — SODIUM CHLORIDE 0.9 % IV SOLN
125.0000 mg | INTRAVENOUS | Status: DC
  Filled 2016-11-05: qty 10

## 2016-11-05 MED ORDER — PENTAFLUOROPROP-TETRAFLUOROETH EX AERO: 1.0000 "application " | INHALATION_SPRAY | CUTANEOUS | Status: DC | PRN

## 2016-11-05 MED ORDER — LIDOCAINE-PRILOCAINE 2.5-2.5 % EX CREA: 1.0000 "application " | TOPICAL_CREAM | CUTANEOUS | Status: DC | PRN

## 2016-11-05 MED ORDER — TRAMADOL HCL 50 MG PO TABS
ORAL_TABLET | ORAL | Status: AC
  Administered 2016-11-05: 50 mg via ORAL
  Filled 2016-11-05: qty 1

## 2016-11-05 MED ORDER — ALTEPLASE 2 MG IJ SOLR: 2.0000 mg | Freq: Once | INTRAMUSCULAR | Status: DC | PRN

## 2016-11-05 MED ORDER — SODIUM CHLORIDE 0.9 % IV SOLN: 100.0000 mL | INTRAVENOUS | Status: DC | PRN

## 2016-11-05 MED ORDER — METOPROLOL TARTRATE 12.5 MG HALF TABLET: 12.5000 mg | ORAL_TABLET | Freq: Two times a day (BID) | ORAL | Status: DC

## 2016-11-05 NOTE — Progress Notes (Addendum)
      [address]       [place] [address]             [phone]            Subjective: Daughter and wife at chair side. He is tired as he was awakened very early this morning in order to undergo HD. Daughter states has pain on backside/buttocks.  Objective: Vital signs in last 24 hours: Temp:  [97.3 F (36.3 C)-97.9 F (36.6 C)] 97.3 F (36.3 C) (10/24 0855) Pulse Rate:  [71-89] 75 (10/24 0855) Cardiac Rhythm: Normal sinus rhythm (10/24 0422) Resp:  [17-21] 17 (10/24 0855) BP: (86-113)/(29-63) 107/45 (10/24 0855) SpO2:  [96 %-100 %] 98 % (10/24 0855) Weight:  [205 lb 5.6 oz (93.1 kg)] 205 lb 5.6 oz (93.1 kg) (10/24 0330)  Pre op weight 98.9 kg Current Weight  11/05/16 205 lb 5.6 oz (93.1 kg)      Intake/Output from previous day: 10/23 0701 - 10/24 0700 In: 480 [P.O.:480] Out: -    Physical Exam:  Cardiovascular: RRR Pulmonary: Clear to auscultation bilaterally Abdomen: Soft, non tender, bowel sounds present. Extremities: Trace bilateral lower extremity edema. Wounds: Sternal and RLE wounds are clean and dry.  No erythema or signs of infection.  Lab Results: CBC: Recent Labs  11/04/16 0541 11/05/16 0502  WBC 7.6 8.3  HGB 8.8* 8.9*  HCT 27.8* 27.8*  PLT 260 305   BMET:  Recent Labs  11/04/16 0541 11/05/16 0502  NA 134* 133*  K 5.8* 6.0*  CL 96* 93*  CO2 24 22  GLUCOSE 79 112*  BUN 80* 98*  CREATININE 6.66* 7.69*  CALCIUM 8.2* 8.3*    PT/INR:  Lab Results  Component Value Date   INR 2.64 11/05/2016   INR 2.15 11/04/2016   INR 1.65 11/03/2016   ABG:  INR: Will add last result for INR, ABG once components are confirmed Will add last 4 CBG results once components are confirmed  Assessment/Plan:  1. CV - History of PAF. Maintaining SR. On Amiodarone 200 mg bid, Midodrine 10 mg tid, Lopressor 12.5 mg bid, and Coumadin. INR increased from 2.15 to 2.64. 2.  Pulmonary - On room air. 3. AKI/CKD-Had HD earlier   today. 4. Hyperparathyroidism- 5. DM-CBGs 102/140/200. On Insulin. 6. Anemia related to #3-H and H this am stable at 8.9 and 27.8  ZIMMERMAN,DONIELLE MPA-C 11/05/2016,11:01 AM  I have seen and examined the patient and agree with the assessment and plan as outlined.  Appreciate all of the efforts on behalf of Dr. Posey Pronto and the entire CIR team.  Rexene Alberts, MD 11/05/2016 1:51 PM

## 2016-11-05 NOTE — Progress Notes (Signed)
Physical Therapy Session Note  Patient Details  Name: [name] MRN: [id number] Date of Birth: [birth year]  Today's Date: 11/05/2016 PT Individual Time: 5809-9833, 8250-5397 PT Individual Time Calculation (min): 30 min, 15 min  PT Missed Time: 30 minutes total  Short Term Goals: Week 1:  PT Short Term Goal 1 (Week 1): Pt will participate in >10 minutes of upright activity w/o change in vital signs  PT Short Term Goal 2 (Week 1): Pt will transfer bed<>chair w/ Mod A  PT Short Term Goal 3 (Week 1): Pt will perform all mobility while maintaining sternal precautions  PT Short Term Goal 4 (Week 1): Pt will perform bed mobility w/ Mod A   Skilled Therapeutic Interventions/Progress Updates:    Session 1: Pt at HD upon PT arrival, RN reported that pt was on his way back to the unit now. Pt missed 15 minutes of skilled therapy this session secondary to being in HD. Pt received from transport services in bed in room, agreeable to therapy tx. Pt stated "I would like to eat breakfast." Agreeable to getting OOB and into recliner to eat. Pt transferred from supine to sitting EOB with max assist in order to maintain sternal precautions. Pt seated EOB with supervision for static sitting x 3 minutes. Pt performed sit<>stands x 2 without UE support to maintain precautions and max assist from therapist. Pt performed stand pivot transfer without AD and max assist. Pt left seated in recliner with needs in reach and family present.   Session 2:  Pt supine in bed upon PT arrival, RN reports pt with low BP this afternoon but suggested to see how he tolerates activity. Pt agreeable to therapy tx although reports feeling very fatigued. Vitals monitored in supine: BP 79/40, HR 60, SpO2 100%. Pt transferred supine to sitting EOB with max assist to maintain sternal precautions. Pt maintained static sitting balance with min assist while vitals monitored. Vitals assessed in sitting: BP 74/34, HR 60 and SpO2 97%. Pt returned  to supine max assist, therapy held for the remainder of this session due to LBP. Pt missed 15 minutes of skilled therapy this session.   Therapy Documentation Precautions:  Precautions Precautions: Fall, Sternal Restrictions Weight Bearing Restrictions: Yes (sternal preacutions) RUE Weight Bearing: Weight bearing as tolerated    See Function Navigator for Current Functional Status.   Therapy/Group: Individual Therapy  Netta Corrigan, PT, DPT 11/05/2016, 10:15 AM

## 2016-11-05 NOTE — Procedures (Signed)
I was present at this session.  I have reviewed the session itself and made appropriate changes.  Hd via PC, tol well, bp in 90s,  Vol xs.    Elcie Pelster L 10/24/20189:07 AM

## 2016-11-05 NOTE — Patient Care Conference (Signed)
Inpatient RehabilitationTeam Conference and Plan of Care Update Date: 11/05/2016   Time: 2:25 PM    Patient Name: [name]      Medical Record Number: [id number]  Date of Birth: [birth year] Sex: Male         Room/Bed: 4M04C/4M04C-01 Payor Info: Payor: MEDICARE / Plan: MEDICARE PART A AND B / Product Type: *No Product type* /    Admitting Diagnosis: CAD ESRD  Admit Date/Time:  11/03/2016  4:39 PM Admission Comments: No comment available   Primary Diagnosis:  <principal problem not specified> Principal Problem: <principal problem not specified>  Patient Active Problem List   Diagnosis Date Noted  . Shortness of breath   . Type 2 diabetes mellitus with peripheral neuropathy (HCC)   . PAF (paroxysmal atrial fibrillation) ([place])   . Debility 11/03/2016  . Coronary artery disease involving native heart without angina pectoris   . AKI (acute kidney injury) ([place])   . Dysphagia   . Chronic diastolic heart failure ([place])   . Benign prostatic hyperplasia   . Hx of CABG   . S/P CABG (coronary artery bypass graft)   . Acute blood loss anemia   . Anemia of chronic disease   . Chronic obstructive pulmonary disease ([place])   . Cardiogenic shock ([place])   . Spontaneous pneumothorax   . Acute respiratory failure with hypercapnia ([place])   . Acute renal failure ([place])   . S/P CABG x 2 + maze procedure 10/09/2016  . S/P Maze operation for atrial fibrillation 10/09/2016  . Coronary artery disease involving native coronary artery of native heart with angina pectoris ([place]) 09/30/2016  . Fusarium infection ([place]) 01/10/2016  . Dyspnea on exertion 09/12/2015  . Sepsis due to undetermined organism ([place])   . Chronic systolic CHF (congestive heart failure) ([place])   . Chronic kidney disease (CKD), stage IV (severe) ([place])   . Cellulitis of left lower extremity   . Pulmonary hypertension ([place])   . Paroxysmal atrial fibrillation (HCC)   . Other emphysema ([place])   . Bronchiectasis without complication ([place])    . Other specified hypothyroidism   . Fever 08/18/2014  . Sepsis ([place]) 08/18/2014  . CKD (chronic kidney disease) stage 4, GFR 15-29 ml/min (HCC) 08/18/2014  . Acute on chronic systolic heart failure ([place]) 08/18/2014  . Bronchiectasis ([place]) 08/18/2014  . Hypothyroidism 08/18/2014  . Bronchiectasis without acute exacerbation ([place]) 12/29/2013  . LVH (left ventricular hypertrophy) due to hypertensive disease 12/10/2013  . Atrial fibrillation ([place]) 12/08/2013  . CHF (congestive heart failure) ([place]) 12/07/2013  . Chronic obstructive airway disease with asthma ([place]) 12/07/2013  . Thyroid goiter 02/01/2013  . Low TSH level 05/18/2012  . Right thyroid nodule 05/18/2012  . Diabetes ([place]) 05/18/2012  . HLD (hyperlipidemia) 05/18/2012  . Rheumatoid arthritis ([place]) 05/18/2012  . Essential hypertension 05/18/2012  . GERD (gastroesophageal reflux disease) 05/18/2012  . Anemia 05/18/2012  . Knee pain 02/12/2012    Expected Discharge Date:    Team Members Present: Physician leading conference: Dr. Delice Lesch Social Worker Present: Ovidio Kin, LCSW Nurse Present: Arelia Sneddon, RN PT Present: Michaelene Song, PT OT Present: Clyda Greener, OT SLP Present: Windell Moulding, SLP PPS Coordinator present : Daiva Nakayama, RN, CRRN     Current Status/Progress Goal Weekly Team Focus  Medical   Debility secondary to CABG/Maze procedure 10/09/2016/multi-medical. Patient has sternal precautions  Improve safety, endurance, dysphagia  See above   Bowel/Bladder   (P) Continent of bowel and bladder, LBM 11-04-16 olguric  (  P) remain continent of bowel and bladder  (P) Assisting with tolieting needs prn   Swallow/Nutrition/ Hydration   Dys 3, honey thick liquids; full supervision   mod I   trials of ice chips following oral care    ADL's      modified independent to supervision      Mobility   Max A transfers, Mod-Max A bed mobility, refused gait and w/c mobility at eval   Min A overall   Transfers,  tolerance to OOB activity, adherence to sternal precautions, endurance, initiate gait    Communication             Safety/Cognition/ Behavioral Observations  min-mod assist  supervision   recall of daily information   Pain   (P) n         Skin                *See Care Plan and progress notes for long and short-term goals.     Barriers to Discharge  Current Status/Progress Possible Resolutions Date Resolved   Physician    Medical stability;Decreased caregiver support;Hemodialysis;Lack of/limited family support     See above  Therapies, follow labs, monitor weights, optimize DM      Nursing                  PT  Inaccessible home environment;Home environment access/layout;Weight bearing restrictions  WB restrictions - sternal precautions, 5 adult children very involved, lives w/ wife who has memory issues               OT                  SLP                SW Decreased caregiver support Wife has memory issues and needs supervision herself            Discharge Planning/Teaching Needs:    Home with family providing care if possible. 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 May need short term NHP     Team Discussion:  Goals min-supervision level. Multiple medical issues being addressed. HD last night early this am, did not sleep well due to this. MD watching anemia and diabetes. Pain in sacrum 10-10 reported by RN. MD aware. May change to 15/7 due to endurance and fatigue issues. Wait to set target DC until next week.  Revisions to Treatment Plan:  Set DC next week    Continued Need for Acute Rehabilitation Level of Care: The patient requires daily medical management by a physician with specialized training in physical medicine and rehabilitation for the following conditions: Daily direction of a multidisciplinary physical rehabilitation program to ensure safe treatment while eliciting the highest outcome that is of practical value to the patient.:  Yes Daily medical management of patient stability for increased activity during participation in an intensive rehabilitation regime.: Yes Daily analysis of laboratory values and/or radiology reports with any subsequent need for medication adjustment of medical intervention for : Cardiac problems;Post surgical problems;Diabetes problems;Other  Kaliyah Gladman, Gardiner Rhyme 11/05/2016, 3:07 PM

## 2016-11-05 NOTE — Progress Notes (Signed)
Subjective: Interval History: has no complaint, getting stronger.  Objective: Vital signs in last 24 hours: Temp:  [97.4 F (36.3 C)-97.9 F (36.6 C)] 97.4 F (36.3 C) (10/24 0422) Pulse Rate:  [71-89] 81 (10/24 0830) Resp:  [17-21] 17 (10/24 0600) BP: (86-113)/(29-63) 89/51 (10/24 0830) SpO2:  [96 %-100 %] 99 % (10/24 0600) Weight:  [93.1 kg (205 lb 5.6 oz)] 93.1 kg (205 lb 5.6 oz) (10/24 0330) Weight change: 0.145 kg (5.1 oz)  Intake/Output from previous day: 10/23 0701 - 10/24 0700 In: 480 [P.O.:480] Out: -  Intake/Output this shift: No intake/output data recorded.  General appearance: alert, cooperative, no distress and mildly obese Resp: rales bibasilar and rhonchi bibasilar Chest wall: RIJ cath Cardio: S1, S2 normal and systolic murmur: systolic ejection 2/6, decrescendo at 2nd left intercostal space GI: soft, liver down 5 cm Extremities: edema 2+ and AVF RUA b&t  Lab Results:  Recent Labs  11/04/16 0541 11/05/16 0502  WBC 7.6 8.3  HGB 8.8* 8.9*  HCT 27.8* 27.8*  PLT 260 305   BMET:  Recent Labs  11/04/16 0541 11/05/16 0502  NA 134* 133*  K 5.8* 6.0*  CL 96* 93*  CO2 24 22  GLUCOSE 79 112*  BUN 80* 98*  CREATININE 6.66* 7.69*  CALCIUM 8.2* 8.3*   No results for input(s): PTH in the last 72 hours. Iron Studies:  Recent Labs  11/05/16 0502  IRON 21*  TIBC 190*    Studies/Results: Dg Chest 2 View  Result Date: 11/03/2016 CLINICAL DATA:  Shortness of breath EXAM: CHEST  2 VIEW COMPARISON:  Chest x-rays dated 10/31/2016 in 10/2015 2018. FINDINGS: Stable cardiomegaly. Median sternotomy wires appear intact and stable in alignment. Right chest wall dialysis catheter appears well positioned with tip at the level of the lower SVC/ cavoatrial junction. There is persistent mild central pulmonary vascular congestion and interstitial edema. No overt alveolar pulmonary edema. No new confluent airspace opacity to suggest a developing pneumonia. No pleural  effusion or pneumothorax seen. IMPRESSION: 1. Persistent central pulmonary vascular congestion and mild bilateral interstitial edema, compatible with continued mild CHF/volume overload. 2. No evidence of pneumonia or alveolar pulmonary edema. 3. Stable cardiomegaly. Electronically Signed   By: Franki Cabot M.D.   On: 11/03/2016 21:28    I have reviewed the patient's current medications.  Assessment/Plan: 1 ESRD HD today, vol xs, solute ok.  2 anemia esa, Fe low ,give at HD 3 HPTH level pending 4 DM stable 5 CAD/CABG/MAZE per Cards, CVS 6 [facility] rehab P HD, esa, iv Fe, checkPTH     LOS: 2 days   Kasara Schomer L 11/05/2016,9:08 AM

## 2016-11-05 NOTE — Progress Notes (Signed)
Daughter [place] notified we are unable to care for mother with dementia because of safety issues. Mother left three hours here without a care taker. Mother urinated on floor and has strong odor of UTI. Suggested a doctors visit. Daughter states understands and will see there is a care taker.

## 2016-11-05 NOTE — Progress Notes (Signed)
Speech Language Pathology Daily Session Note  Patient Details  Name: [name] MRN: [id number] Date of Birth: January 15, 1930  Today's Date: 11/05/2016 SLP Individual Time: 1110-1205 SLP Individual Time Calculation (min): 55 min  Short Term Goals: Week 1: SLP Short Term Goal 1 (Week 1): Pt will consume trials of ice chips with minimal overt s/s of aspiration and supervision cues for use of swallowing precautions over 3 consecutive sessions prior to repeat objective assessment.  SLP Short Term Goal 2 (Week 1): Pt will utilize external aids to facilitate recall of daily information with supervision verbal cues.   SLP Short Term Goal 3 (Week 1): Pt will complete semi-complex tasks with supervision verbal cues for functional problem solving.    Skilled Therapeutic Interventions:  Pt was seen for skilled ST targeting goals for swallowing and family education.  Pt consumed dys 3 textures and honey thick liquids with distant supervision cues for use of swallowing precautions.  Pt demonstrated no overt s/s of aspiration with solids or liquids.  Pt had only recently returned from hemodialysis and was on 3L O2 via nasal cannula.  When sitting at rest, pt was able to maintain O2 sats in the mid to high 90s on room air but became symptomatic when transferring back to bed and requested supplemental O2 again.  Pt left with nasal cannula donned.  Pt was able to complete 25 repetitions of EMST with mod I and a self perceived effort level of 5 out of 10.   Pt's daughter and wife were present throughout today's session and were educated regarding rationale for currently prescribed diet and ST interventions while inpatient.  All questions were answered to their satisfaction at this time.  Pt was left in bed with wife and daughter at bedside.  Continue per current plan of care.    Function:  Eating Eating   Modified Consistency Diet: Yes Eating Assist Level: Set up assist for;Supervision or verbal cues            Cognition Comprehension Comprehension assist level: Follows basic conversation/direction with extra time/assistive device  Expression   Expression assist level: Expresses basic 90% of the time/requires cueing < 10% of the time.  Social Interaction Social Interaction assist level: Interacts appropriately 90% of the time - Needs monitoring or encouragement for participation or interaction.  Problem Solving Problem solving assist level: Solves basic 75 - 89% of the time/requires cueing 10 - 24% of the time  Memory Memory assist level: Recognizes or recalls 75 - 89% of the time/requires cueing 10 - 24% of the time    Pain Pain Assessment Pain Assessment: No/denies pain   Therapy/Group: Individual Therapy  [name], [name] 11/05/2016, 12:41 PM

## 2016-11-05 NOTE — Progress Notes (Signed)
Called Dan Anguillula, PA for manual B/P of 70/40. Asymptomatic x for weakness. Same orders of Dr Posey Pronto earlier. Hold Lopressor and continue to monitor.

## 2016-11-05 NOTE — Progress Notes (Signed)
ANTICOAGULATION CONSULT NOTE - Initial Consult  Pharmacy Consult for Warfarin Indication: atrial fibrillation  No Known Allergies  Patient Measurements: Weight:  (UTA, bed wt was 109.1kg, bed wt an hr ago was 93.1kg)  Vital Signs: Temp: 97.3 F (36.3 C) (10/24 0855) Temp Source: Oral (10/24 0855) BP: 107/45 (10/24 0855) Pulse Rate: 75 (10/24 0855)  Labs:  Recent Labs  11/03/16 0211 11/04/16 0541 11/05/16 0502 11/05/16 0503  HGB  --  8.8* 8.9*  --   HCT  --  27.8* 27.8*  --   PLT  --  260 305  --   LABPROT 19.3* 23.8*  --  28.0*  INR 1.65 2.15  --  2.64  CREATININE  --  6.66* 7.69*  --     Estimated Creatinine Clearance: 7.9 mL/min (A) (by C-G formula based on SCr of 7.69 mg/dL (H)).   Assessment: 30 YOM transferred to CIR on 10/22 after a prolonged hospital stay that included CABG/MAZE with AKI and new ESRD. The patient has a hx of Afib and was switched from Apixaban to Warfarin during the last admission  INR 2.64, therapeutic, PO intake seems improving, amiodarone dose decreased to 200 mg BID, cbc stable, No bleeding noted per chart.  Goal of Therapy:  INR 2-3 Monitor platelets by anticoagulation protocol: Yes   Plan:  1. Warfarin 2.5 mg x 1 dose today 2. Will continue to monitor for any signs/symptoms of bleeding and will follow up with PT/INR in the a.m.   Thank you for allowing pharmacy to be a part of this patient's care.  Maryanna Shape, PharmD, BCPS  Clinical Pharmacist  Pager: [phone]   11/05/2016 1:11 PM

## 2016-11-05 NOTE — Progress Notes (Signed)
Occupational Therapy Session Note  Patient Details  Name: DEVONTAE CASASOLA MRN: [id number] Date of Birth: 08-21-1930  Today's Date: 11/05/2016 OT Individual Time: 1300-1330 OT Individual Time Calculation (min): 30 min  and Today's Date: 11/05/2016 OT Missed Time: 30 Minutes Missed Time Reason: Patient fatigue   Short Term Goals: Week 1:  OT Short Term Goal 1 (Week 1): Pt will completed toilet transfer with consistent Mod A of 1 OT Short Term Goal 2 (Week 1): Pt will tolerate 1 minute standing at the sink in preparation for BADL tsak OT Short Term Goal 3 (Week 1): Pt will adhere to sternal precautions with min verbal cues during BADL tasks  Skilled Therapeuti."c Interventions/Progress Updates:    Pt on bedpan upon arrival.  Pt stated he was "too tired" to get OOB.  Pt stated he was awake all night awaiting HD but they didn't transport him until 0400 this morning.  Pt stated he "just needed to rest."  Focus on bed mobility for removal of bedpan, hygiene, and donning of brief.  Pt's scrotum extremely red.  RN notified and instructed therapist to apply hydrocortisone cream.  Pt required min A for rolling in bed and tot A for repositioning 2/2 sternal precautions.  Pt remained in bed with all needs within reach and wife present.   Therapy Documentation Precautions:  Precautions Precautions: Fall, Sternal Restrictions Weight Bearing Restrictions: Yes (sternal preacutions) RUE Weight Bearing: Weight bearing as tolerated General: General OT Amount of Missed Time: 30 Minutes Vital Signs:  Pain: Pain Assessment Pain Assessment: No/denies pain  See Function Navigator for Current Functional Status.   Therapy/Group: Individual Therapy  Leroy Libman 11/05/2016, 1:47 PM

## 2016-11-05 NOTE — Progress Notes (Signed)
[facility] PHYSICAL MEDICINE & REHABILITATION     PROGRESS NOTE  Subjective/Complaints:  Pt seen laying in bed in HD this AM. He slept fairly overnight due to being taken to HD at 4AM.  He states his first day of therapies went well.   ROS: Denies CP, SOB, N/V/D.  Objective: Vital Signs: Blood pressure (!) 107/45, pulse 75, temperature (!) 97.3 F (36.3 C), temperature source Oral, resp. rate 17, weight 93.1 kg (205 lb 5.6 oz), SpO2 98 %. Dg Chest 2 View  Result Date: 11/03/2016 CLINICAL DATA:  Shortness of breath EXAM: CHEST  2 VIEW COMPARISON:  Chest x-rays dated 10/31/2016 in 10/2015 2018. FINDINGS: Stable cardiomegaly. Median sternotomy wires appear intact and stable in alignment. Right chest wall dialysis catheter appears well positioned with tip at the level of the lower SVC/ cavoatrial junction. There is persistent mild central pulmonary vascular congestion and interstitial edema. No overt alveolar pulmonary edema. No new confluent airspace opacity to suggest a developing pneumonia. No pleural effusion or pneumothorax seen. IMPRESSION: 1. Persistent central pulmonary vascular congestion and mild bilateral interstitial edema, compatible with continued mild CHF/volume overload. 2. No evidence of pneumonia or alveolar pulmonary edema. 3. Stable cardiomegaly. Electronically Signed   By: Franki Cabot M.D.   On: 11/03/2016 21:28    Recent Labs  11/04/16 0541 11/05/16 0502  WBC 7.6 8.3  HGB 8.8* 8.9*  HCT 27.8* 27.8*  PLT 260 305    Recent Labs  11/04/16 0541 11/05/16 0502  NA 134* 133*  K 5.8* 6.0*  CL 96* 93*  GLUCOSE 79 112*  BUN 80* 98*  CREATININE 6.66* 7.69*  CALCIUM 8.2* 8.3*   CBG (last 3)   Recent Labs  11/04/16 1204 11/04/16 1624 11/04/16 2046  GLUCAP 102* 140* 200*    Wt Readings from Last 3 Encounters:  11/05/16 93.1 kg (205 lb 5.6 oz)  11/03/16 92.6 kg (204 lb 2.3 oz)  10/06/16 99 kg (218 lb 5 oz)    Physical Exam:  BP (!) 107/45 (BP Location:  Left Arm)   Pulse 75   Temp (!) 97.3 F (36.3 C) (Oral)   Resp 17   Wt 93.1 kg (205 lb 5.6 oz)   SpO2 98%   BMI 31.22 kg/m  Constitutional: He appears well-developed. NAD. Frail  HENT: Normocephalic and atraumatic.  Eyes: EOM are normal. No discharge.  Cardiovascular: RRR.  No JVD. Respiratory: Effort normal and breath sounds normal. +[place] [facility]: Bowel sounds are normal. He exhibits no distension.  Musculoskeletal: He exhibits no edema or tenderness.  Neurological:  Alert and oriented Motor: 4-/5 throughout (stable) Skin: Skin is warm and dry. Intact.  Psychiatric: Normal mood and behavior.  Assessment/Plan: 1. Functional deficits secondary to debility which require 3+ hours per day of interdisciplinary therapy in a comprehensive inpatient rehab setting. Physiatrist is providing close team supervision and 24 hour management of active medical problems listed below. Physiatrist and rehab team continue to assess barriers to discharge/monitor patient progress toward functional and medical goals.  Function:  Bathing Bathing position   Position: Wheelchair/chair at sink  Bathing parts Body parts bathed by patient: Right arm, Left arm, Chest, Abdomen, Left upper leg, Right upper leg Body parts bathed by helper: Left lower leg, Right lower leg, Buttocks, Front perineal area  Bathing assist Assist Level: Touching or steadying assistance(Pt > 75%)      Upper Body Dressing/Undressing Upper body dressing   What is the patient wearing?: Pull over shirt/dress     Pull  over shirt/dress - Perfomed by patient: Thread/unthread right sleeve, Thread/unthread left sleeve Pull over shirt/dress - Perfomed by helper: Put head through opening, Pull shirt over trunk        Upper body assist Assist Level: Touching or steadying assistance(Pt > 75%)      Lower Body Dressing/Undressing Lower body dressing   What is the patient wearing?: Non-skid slipper socks, Pants       Pants- Performed by  helper: Thread/unthread right pants leg, Thread/unthread left pants leg, Pull pants up/down   Non-skid slipper socks- Performed by helper: Don/doff right sock, Don/doff left sock                  Lower body assist Assist for lower body dressing: Touching or steadying assistance (Pt > 75%)      Toileting Toileting Toileting activity did not occur: No continent bowel/bladder event        Toileting assist     Transfers Chair/bed transfer   Chair/bed transfer method: Stand pivot Chair/bed transfer assist level: Maximal assist (Pt 25 - 49%/lift and lower)       Locomotion Ambulation Ambulation activity did not occur: Scientist, research (physical sciences) activity did not occur: Refused Type: Manual      Cognition Comprehension Comprehension assist level: Understands basic 75 - 89% of the time/ requires cueing 10 - 24% of the time  Expression Expression assist level: Expresses basic 90% of the time/requires cueing < 10% of the time.  Social Interaction Social Interaction assist level: Interacts appropriately 90% of the time - Needs monitoring or encouragement for participation or interaction.  Problem Solving Problem solving assist level: Solves basic 75 - 89% of the time/requires cueing 10 - 24% of the time  Memory Memory assist level: Recognizes or recalls 75 - 89% of the time/requires cueing 10 - 24% of the time    Medical Problem List and Plan: 1.  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 Patient has sternal precautions  Cont CIR 2.  DVT Prophylaxis/Anticoagulation: Chronic Coumadin therapy for history of PAF as well as Lovenox for bridging. 3. Pain Management: Neurontin 300 mg twice a day, Ultram as needed 4. Mood: Provide emotional support 5. Neuropsych: This patient is capable of making decisions on his own behalf. 6. Skin/Wound Care: Routine skin checks 7. Fluids/Electrolytes/Nutrition: Routine I&Os 8.AKI/CKD stage 3-4. Follow-up  renal services. Presently receiving hemodialysis Monday Wednesday Friday schedule monitoring for recovery of renal function.S/P right IJ tunneled dialysis catheter/right brachiocephalic AV fistula 82/99/3716 9. Acute on chronic anemia. Continue iron supplement  Hb 8.9 on 10/24  Cont to monitor 10. Dysphagia. Dysphagia #3 honey liquids. Monitor hydration.   May need IV fluids   Advance diet as tolerated 11. Chronic diastolic congestive heart failure. Monitor for any signs of fluid overload.  Filed Weights   11/04/16 0314 11/05/16 0330  Weight: 93 kg (205 lb 0.4 oz) 93.1 kg (205 lb 5.6 oz)   CXR reviewed, suggesting CHF  Cont supplemental 02 12. PAF. Chronic Coumadin, Lopressor 12.5 mg twice a day, amiodarone 200 mg every 12 hours 13. Diabetes mellitus and peripheral neuropathy. Hemoglobin A1c 7.2. Levemir 28 units twice a day. Check blood sugars before meals and at bedtime.  No values reported since yesterday afternoon  Will cont to monitor 14. BPH. Proscar 5 mg daily, Flomax 0.4 mg daily 15. Hyperlipidemia. Lipitor 16. Hypothyroidism. Synthroid  LOS (Days) 2 A FACE TO FACE EVALUATION WAS PERFORMED  Ankit Lorie Phenix 11/05/2016  10:26 AM

## 2016-11-05 NOTE — Significant Event (Signed)
BP 70/45 approximately 1530 Dr Posey Pronto notified, orders received. Hold Lopressor. Asymptomatic. Continue with plan of care

## 2016-11-05 NOTE — Progress Notes (Signed)
HD tx initiated via HD cath w/o problem, pull/push/flush equally w/o problem, VSS w/ soft bp, will cont to monitor while on HD tx 

## 2016-11-05 NOTE — Progress Notes (Signed)
Social Work Shaleigh Laubscher, Eliezer Champagne Social Worker Signed Physical Medicine and Rehabilitation  Patient Care Conference Date of Service: 11/05/2016  3:07 PM      Hide copied text Hover for attribution information Inpatient RehabilitationTeam Conference and Plan of Care Update Date: 11/05/2016   Time: 2:25 PM      Patient Name: [name]      Medical Record Number: [id number]  Date of Birth: 06-Jan-1931 Sex: Male         Room/Bed: 4M04C/4M04C-01 Payor Info: Payor: MEDICARE / Plan: MEDICARE PART A AND B / Product Type: *No Product type* /     Admitting Diagnosis: CAD ESRD  Admit Date/Time:  11/03/2016  4:39 PM Admission Comments: No comment available    Primary Diagnosis:  <principal problem not specified> Principal Problem: <principal problem not specified>       Patient Active Problem List    Diagnosis Date Noted  . Shortness of breath    . Type 2 diabetes mellitus with peripheral neuropathy (HCC)    . PAF (paroxysmal atrial fibrillation) ([facility])    . Debility 11/03/2016  . Coronary artery disease involving native heart without angina pectoris    . AKI (acute kidney injury) ([place])    . Dysphagia    . Chronic diastolic heart failure ([place])    . Benign prostatic hyperplasia    . Hx of CABG    . S/P CABG (coronary artery bypass graft)    . Acute blood loss anemia    . Anemia of chronic disease    . Chronic obstructive pulmonary disease ([place])    . Cardiogenic shock ([place])    . Spontaneous pneumothorax    . Acute respiratory failure with hypercapnia ([place])    . Acute renal failure ([place])    . S/P CABG x 2 + maze procedure 10/09/2016  . S/P Maze operation for atrial fibrillation 10/09/2016  . Coronary artery disease involving native coronary artery of native heart with angina pectoris ([place]) 09/30/2016  . Fusarium infection ([place]) 01/10/2016  . Dyspnea on exertion 09/12/2015  . Sepsis due to undetermined organism ([place])    . Chronic systolic CHF (congestive heart failure) ([place])      . Chronic kidney disease (CKD), stage IV (severe) ([place])    . Cellulitis of left lower extremity    . Pulmonary hypertension ([place])    . Paroxysmal atrial fibrillation (HCC)    . Other emphysema ([place])    . Bronchiectasis without complication ([place])    . Other specified hypothyroidism    . Fever 08/18/2014  . Sepsis ([place]) 08/18/2014  . CKD (chronic kidney disease) stage 4, GFR 15-29 ml/min (HCC) 08/18/2014  . Acute on chronic systolic heart failure ([place]) 08/18/2014  . Bronchiectasis ([place]) 08/18/2014  . Hypothyroidism 08/18/2014  . Bronchiectasis without acute exacerbation ([place]) 12/29/2013  . LVH (left ventricular hypertrophy) due to hypertensive disease 12/10/2013  . Atrial fibrillation ([place]) 12/08/2013  . CHF (congestive heart failure) ([place]) 12/07/2013  . Chronic obstructive airway disease with asthma ([place]) 12/07/2013  . Thyroid goiter 02/01/2013  . Low TSH level 05/18/2012  . Right thyroid nodule 05/18/2012  . Diabetes ([place]) 05/18/2012  . HLD (hyperlipidemia) 05/18/2012  . Rheumatoid arthritis ([place]) 05/18/2012  . Essential hypertension 05/18/2012  . GERD (gastroesophageal reflux disease) 05/18/2012  . Anemia 05/18/2012  . Knee pain 02/12/2012      Expected Discharge Date:     Team Members Present: Physician leading conference: Dr. Delice Lesch Social Worker Present: Ovidio Kin, LCSW  Nurse Present: Arelia Sneddon, RN PT Present: Michaelene Song, PT OT Present: Clyda Greener, OT SLP Present: Windell Moulding, SLP PPS Coordinator present : Daiva Nakayama, RN, CRRN       Current Status/Progress Goal Weekly Team Focus  Medical     Debility secondary to CABG/Maze procedure 10/09/2016/multi-medical. Patient has sternal precautions  Improve safety, endurance, dysphagia  See above   Bowel/Bladder     (P) Continent of bowel and bladder, LBM 11-04-16 olguric  (P) remain continent of bowel and bladder  (P) Assisting with tolieting needs prn   Swallow/Nutrition/ Hydration     Dys 3,  honey thick liquids; full supervision   mod I   trials of ice chips following oral care    ADL's        modified independent to supervision      Mobility     Max A transfers, Mod-Max A bed mobility, refused gait and w/c mobility at eval   Min A overall   Transfers, tolerance to OOB activity, adherence to sternal precautions, endurance, initiate gait    Communication               Safety/Cognition/ Behavioral Observations   min-mod assist  supervision   recall of daily information   Pain     (P) n         Skin                 *See Care Plan and progress notes for long and short-term goals.      Barriers to Discharge   Current Status/Progress Possible Resolutions Date Resolved   Physician     Medical stability;Decreased caregiver support;Hemodialysis;Lack of/limited family support     See above  Therapies, follow labs, monitor weights, optimize DM      Nursing                 PT  Inaccessible home environment;Home environment access/layout;Weight bearing restrictions  WB restrictions - sternal precautions, 5 adult children very involved, lives w/ wife who has memory issues               OT                 SLP            SW Decreased caregiver support Wife has memory issues and needs supervision herself             Discharge Planning/Teaching Needs:    Home with family providing care if possible. 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 May need short term NHP     Team Discussion:  Goals min-supervision level. Multiple medical issues being addressed. HD last night early this am, did not sleep well due to this. MD watching anemia and diabetes. Pain in sacrum 10-10 reported by RN. MD aware. May change to 15/7 due to endurance and fatigue issues. Wait to set target DC until next week.  Revisions to Treatment Plan:  Set DC next week    Continued Need for Acute Rehabilitation Level of Care: The patient requires daily medical management by a physician  with specialized training in physical medicine and rehabilitation for the following conditions: Daily direction of a multidisciplinary physical rehabilitation program to ensure safe treatment while eliciting the highest outcome that is of practical value to the patient.: Yes Daily medical management of patient stability for increased activity during participation in an intensive rehabilitation regime.: Yes Daily analysis of  laboratory values and/or radiology reports with any subsequent need for medication adjustment of medical intervention for : Cardiac problems;Post surgical problems;Diabetes problems;Other   Elease Hashimoto 11/05/2016, 3:07 PM           Patient ID: Irena Cords, male   DOB: 1930/07/28, 83 y.o.   MRN: [id number]

## 2016-11-06 ENCOUNTER — Inpatient Hospital Stay (HOSPITAL_COMMUNITY): Payer: Medicare Other | Admitting: Occupational Therapy

## 2016-11-06 ENCOUNTER — Inpatient Hospital Stay (HOSPITAL_COMMUNITY): Payer: Medicare Other | Admitting: Physical Therapy

## 2016-11-06 ENCOUNTER — Inpatient Hospital Stay (HOSPITAL_COMMUNITY): Payer: Medicare Other | Admitting: Speech Pathology

## 2016-11-06 DIAGNOSIS — R7309 Other abnormal glucose: Secondary | ICD-10-CM

## 2016-11-06 LAB — GLUCOSE, CAPILLARY
GLUCOSE-CAPILLARY: 61 mg/dL — AB (ref 65–99)
GLUCOSE-CAPILLARY: 103 mg/dL — AB (ref 65–99)
GLUCOSE-CAPILLARY: 164 mg/dL — AB (ref 65–99)
Glucose-Capillary: 85 mg/dL (ref 65–99)
Glucose-Capillary: 123 mg/dL — ABNORMAL HIGH (ref 65–99)

## 2016-11-06 LAB — PARATHYROID HORMONE, INTACT (NO CA): PTH: 447 pg/mL — ABNORMAL HIGH (ref 15–65)

## 2016-11-06 LAB — PROTIME-INR
INR: 3.26
Prothrombin Time: 33 seconds — ABNORMAL HIGH (ref 11.4–15.2)

## 2016-11-06 MED ORDER — DARBEPOETIN ALFA 200 MCG/0.4ML IJ SOSY
200.0000 ug | PREFILLED_SYRINGE | INTRAMUSCULAR | Status: DC
  Administered 2016-11-12: 200 ug via INTRAVENOUS
  Filled 2016-11-06: qty 0.4

## 2016-11-06 MED ORDER — SODIUM CHLORIDE 0.9 % IV SOLN
125.0000 mg | INTRAVENOUS | Status: DC
  Administered 2016-11-07 – 2016-11-21 (×7): 125 mg via INTRAVENOUS
  Filled 2016-11-06 (×14): qty 10

## 2016-11-06 NOTE — Progress Notes (Signed)
Occupational Therapy Session Note  Patient Details  Name: [name] MRN: [id number] Date of Birth: [birth year]  Today's Date: 11/06/2016 OT Individual Time: 6168-3729 OT Individual Time Calculation (min): 45 min (missed 15 min due to fatigue)   Short Term Goals: Week 1:  OT Short Term Goal 1 (Week 1): Pt will completed toilet transfer with consistent Mod A of 1 OT Short Term Goal 2 (Week 1): Pt will tolerate 1 minute standing at the sink in preparation for BADL tsak OT Short Term Goal 3 (Week 1): Pt will adhere to sternal precautions with min verbal cues during BADL tasks  Skilled Therapeutic Interventions/Progress Updates:    Pt received in bed and pt instructed to work towards his tolerance as he was having low blood pressure.  Pt was very doubtful he would be able to tolerate OOB but was willing to sit EOB.  Initially he worked on rolling with min A to each side as therapist assisted with LB bathing and dressing. Sat to EOB with mod A due to sternal precautions and tolerated sitting for at least 15 min for UB bathing and dressing which he was able to do with set up.  Pt did not want to get into wc or recliner. Worked on lateral scooting on bed to L with max A.  Pt adjusted back into supine stating he was too tired to do anymore therapy right now. Pt resting in bed with all needs met.  Therapy Documentation Precautions:  Precautions Precautions: Fall, Sternal Restrictions Weight Bearing Restrictions: Yes (sternal preacutions) RUE Weight Bearing: Weight bearing as tolerated General: General OT Amount of Missed Time: 15 Minutes PT Missed Treatment Reason: Patient fatigue;Other (Comment Therapy Vitals Pulse Rate: 70 BP: (!) 84/43 Patient Position (if appropriate): Lying Pain: Pain Assessment Pain Assessment: No/denies pain ADL: ADL ADL Comments: Please see functional navigator  See Function Navigator for Current Functional Status.   Therapy/Group: Individual  Therapy  [place] 11/06/2016, 12:42 PM

## 2016-11-06 NOTE — Progress Notes (Signed)
Occupational Therapy Session Note  Patient Details  Name: [name] MRN: [id number] Date of Birth: [birth year]  Today's Date: 11/06/2016 OT Individual Time: 2774-1287 OT Individual Time Calculation (min): 28 min    Short Term Goals: Week 1:  OT Short Term Goal 1 (Week 1): Pt will completed toilet transfer with consistent Mod A of 1 OT Short Term Goal 2 (Week 1): Pt will tolerate 1 minute standing at the sink in preparation for BADL tsak OT Short Term Goal 3 (Week 1): Pt will adhere to sternal precautions with min verbal cues during BADL tasks  Skilled Therapeutic Interventions/Progress Updates:    Pt's family present for session.  BP taken in supine prior to attempting to sit up at 108/48.  He then completed supine to sit with mod assist, following sternal precautions.  Once sitting BP was taken again at 98/48.  No reports of dizziness stated.  He was able to complete sit to stand X2 with min assist and then stand pivot transfer with use of the RW and min to mod assist during session.  Standing endurance only 30-45 seconds secondary to fatigue and weakness in his legs.  Transferred pt to the recliner with LEs elevated to complete session.  BP taken again at 99/47.  Pt left in wheelchair with family present and call button in reach.  Safety plan updated and NT made aware of pt's transfer status.     Therapy Documentation Precautions:  Precautions Precautions: Fall, Sternal Restrictions Weight Bearing Restrictions: No RUE Weight Bearing: Weight bearing as tolerated  Vital Signs: Therapy Vitals Temp: 98 F (36.7 C) Temp Source: Oral Pulse Rate: 67 Resp: 20 BP: (!) 98/48 Patient Position (if appropriate): Sitting Oxygen Therapy SpO2: 98 % O2 Device: Not Delivered O2 Flow Rate (L/min): 2 L/min Pulse Oximetry Type: Intermittent Pain: Pain Assessment Pain Assessment: No/denies pain ADL: See Function Navigator for Current Functional Status.   Therapy/Group: Individual  Therapy  [name] OTR/L 11/06/2016, 4:42 PM

## 2016-11-06 NOTE — Progress Notes (Signed)
Speech Language Pathology Daily Session Note  Patient Details  Name: [name] MRN: [id number] Date of Birth: [birth year]  Today's Date: 11/06/2016 SLP Individual Time: 1305-1350 SLP Individual Time Calculation (min): 45 min  Short Term Goals: Week 1: SLP Short Term Goal 1 (Week 1): Pt will consume trials of ice chips with minimal overt s/s of aspiration and supervision cues for use of swallowing precautions over 3 consecutive sessions prior to repeat objective assessment.  SLP Short Term Goal 2 (Week 1): Pt will utilize external aids to facilitate recall of daily information with supervision verbal cues.   SLP Short Term Goal 3 (Week 1): Pt will complete semi-complex tasks with supervision verbal cues for functional problem solving.   SLP Short Term Goal 4 (Week 1): Pt will complete 25 repetitions of EMST with mod I and a self perceived effort level of <7/10 over 3 consecutive sessions.   SLP Short Term Goal 5 (Week 1): Pt will consume dys 3 textures and hony thick liquids with mod I use of swallowing precautions and minimal overt s/s of aspiration  Skilled Therapeutic Interventions:  Pt was seen for skilled ST targeting cognitive and dysphagia goals.  Pt declined ice chips but was agreeable to eating presentations of his current diet on his lunch tray.  Pt consumed dys 3 textures and honey thick liquids with supervision cues for use of swallowing precautions and no overt s/s of aspiration.  SLP also facilitated the session with a novel card game to address problem solving.  Pt was able to complete task for 100% accuracy with distant supervision for working memory of task rules and procedures.  Furthermore, pt was also able to complete a n-1 working memory procedure task with min assist verbal cues for an average run of ~7 cards.  Pt was left in bed with bed alarm set and call bell within reach.  Continue per current plan of care.     Function:  Eating Eating   Modified Consistency Diet:  Yes Eating Assist Level: More than reasonable amount of time           Cognition Comprehension Comprehension assist level: Follows basic conversation/direction with extra time/assistive device  Expression   Expression assist level: Expresses basic needs/ideas: With no assist  Social Interaction Social Interaction assist level: Interacts appropriately with others with medication or extra time (anti-anxiety, antidepressant).  Problem Solving Problem solving assist level: Solves basic 90% of the time/requires cueing < 10% of the time  Memory Memory assist level: Recognizes or recalls 75 - 89% of the time/requires cueing 10 - 24% of the time    Pain Pain Assessment Pain Assessment: No/denies pain  Therapy/Group: Individual Therapy  Keyanna Sandefer, Selinda [place] 11/06/2016, 2:39 PM

## 2016-11-06 NOTE — Progress Notes (Signed)
[facility]ICAL MEDICINE & REHABILITATION     PROGRESS NOTE  Subjective/Complaints:  Pt seen laying in bed this AM.  He states she slept well overnight.  He notes right elbow pain overnight, but it was relieved with a hot pack.  ROS: Denies CP, SOB, N/V/D.  Objective: Vital Signs: Blood pressure (!) 103/49, pulse 63, temperature 97.8 F (36.6 C), temperature source Oral, resp. rate 18, weight 102.1 kg (225 lb), SpO2 95 %. No results found.  Recent Labs  11/04/16 0541 11/05/16 0502  WBC 7.6 8.3  HGB 8.8* 8.9*  HCT 27.8* 27.8*  PLT 260 305    Recent Labs  11/04/16 0541 11/05/16 0502  NA 134* 133*  K 5.8* 6.0*  CL 96* 93*  GLUCOSE 79 112*  BUN 80* 98*  CREATININE 6.66* 7.69*  CALCIUM 8.2* 8.3*   CBG (last 3)   Recent Labs  11/05/16 2052 11/06/16 0651 11/06/16 0736  GLUCAP 114* 61* 85    Wt Readings from Last 3 Encounters:  11/06/16 102.1 kg (225 lb)  11/03/16 92.6 kg (204 lb 2.3 oz)  10/06/16 99 kg (218 lb 5 oz)    Physical Exam:  BP (!) 103/49 (BP Location: Left Arm)   Pulse 63   Temp 97.8 F (36.6 C) (Oral)   Resp 18   Wt 102.1 kg (225 lb)   SpO2 95%   BMI 34.21 kg/m  Constitutional: He appears well-developed. NAD. Frail  HENT: Normocephalic and atraumatic.  Eyes: EOM are normal. No discharge.  Cardiovascular: RRR.  No JVD. Respiratory: Effort Normal and breath sounds normal. +[place] [facility]: Bowel sounds are normal. He exhibits no distension.  Musculoskeletal: He exhibits no edema or tenderness, including right elbow.  Neurological:  Alert and oriented Motor: 4-/5 throughout (unchanged) Skin: Skin is warm and dry. Intact.  Psychiatric: Normal mood and behavior.  Assessment/Plan: 1. Functional deficits secondary to debility which require 3+ hours per day of interdisciplinary therapy in a comprehensive inpatient rehab setting. Physiatrist is providing close team supervision and 24 hour management of active medical problems listed  below. Physiatrist and rehab team continue to assess barriers to discharge/monitor patient progress toward functional and medical goals.  Function:  Bathing Bathing position   Position: Wheelchair/chair at sink  Bathing parts Body parts bathed by patient: Right arm, Left arm, Chest, Abdomen, Left upper leg, Right upper leg Body parts bathed by helper: Left lower leg, Right lower leg, Buttocks, Front perineal area  Bathing assist Assist Level: Touching or steadying assistance(Pt > 75%)      Upper Body Dressing/Undressing Upper body dressing   What is the patient wearing?: Pull over shirt/dress     Pull over shirt/dress - Perfomed by patient: Thread/unthread right sleeve, Thread/unthread left sleeve Pull over shirt/dress - Perfomed by helper: Put head through opening, Pull shirt over trunk        Upper body assist Assist Level: Touching or steadying assistance(Pt > 75%)      Lower Body Dressing/Undressing Lower body dressing   What is the patient wearing?: Non-skid slipper socks, Pants       Pants- Performed by helper: Thread/unthread right pants leg, Thread/unthread left pants leg, Pull pants up/down   Non-skid slipper socks- Performed by helper: Don/doff right sock, Don/doff left sock                  Lower body assist Assist for lower body dressing: Touching or steadying assistance (Pt > 75%)      Toileting Toileting Toileting  activity did not occur: No continent bowel/bladder event        Toileting assist     Transfers Chair/bed transfer   Chair/bed transfer method: Stand pivot Chair/bed transfer assist level: Maximal assist (Pt 25 - 49%/lift and lower)       Locomotion Ambulation Ambulation activity did not occur: Scientist, research (physical sciences) activity did not occur: Refused Type: Manual      Cognition Comprehension Comprehension assist level: Follows basic conversation/direction with extra time/assistive device  Expression Expression  assist level: Expresses basic 90% of the time/requires cueing < 10% of the time.  Social Interaction Social Interaction assist level: Interacts appropriately 90% of the time - Needs monitoring or encouragement for participation or interaction.  Problem Solving Problem solving assist level: Solves basic 75 - 89% of the time/requires cueing 10 - 24% of the time  Memory Memory assist level: Recognizes or recalls 75 - 89% of the time/requires cueing 10 - 24% of the time    Medical Problem List and Plan: 1.  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 Patient has sternal precautions  Cont CIR 2.  DVT Prophylaxis/Anticoagulation: Chronic Coumadin therapy for history of PAF   INR supratherapeutic on 10/25 3. Pain Management: Neurontin 300 mg twice a day, Ultram as needed 4. Mood: Provide emotional support 5. Neuropsych: This patient is capable of making decisions on his own behalf. 6. Skin/Wound Care: Routine skin checks 7. Fluids/Electrolytes/Nutrition: Routine I&Os 8.AKI/CKD stage 3-4. Follow-up renal services. Presently receiving hemodialysis Monday Wednesday Friday schedule monitoring for recovery of renal function.S/P right IJ tunneled dialysis catheter/right brachiocephalic AV fistula 54/62/7035 9. Acute on chronic anemia. Continue iron supplement  Hb 8.9 on 10/24  Cont to monitor 10. Dysphagia. Dysphagia #3 honey liquids. Monitor hydration.   May need IV fluids   Advance diet as tolerated 11. Chronic diastolic congestive heart failure. Monitor for any signs of fluid overload.  Filed Weights   11/04/16 0314 11/05/16 0330 11/06/16 0500  Weight: 93 kg (205 lb 0.4 oz) 93.1 kg (205 lb 5.6 oz) 102.1 kg (225 lb)   ? Reliability  CXR reviewed, suggesting CHF  Cont supplemental 02 12. PAF. Chronic Coumadin, Lopressor 12.5 mg twice a day, amiodarone 200 mg every 12 hours, Midodrine 10 mg 3 times a day  Appreciate cars recs, notes reviewed 13. Diabetes mellitus and  peripheral neuropathy. Hemoglobin A1c 7.2. Levemir 28 units twice a day. Check blood sugars before meals and at bedtime.  Labile, but overall controlled  Consider decreasing medications if hypoglycemia 14. BPH. Proscar 5 mg daily, Flomax 0.4 mg daily 15. Hyperlipidemia. Lipitor 16. Hypothyroidism. Synthroid  LOS (Days) 3 A FACE TO FACE EVALUATION WAS PERFORMED  Emmali Karow Lorie Phenix 11/06/2016 8:19 AM

## 2016-11-06 NOTE — Progress Notes (Signed)
ANTICOAGULATION CONSULT NOTE - Initial Consult  Pharmacy Consult for Warfarin Indication: atrial fibrillation  No Known Allergies  Patient Measurements: Weight: 225 lb (102.1 kg)  Vital Signs: Temp: 97.8 F (36.6 C) (10/25 0350) Temp Source: Oral (10/25 0350) BP: 84/43 (10/25 0900) Pulse Rate: 70 (10/25 0900)  Labs:  Recent Labs  11/04/16 0541 11/05/16 0502 11/05/16 0503 11/06/16 0738  HGB 8.8* 8.9*  --   --   HCT 27.8* 27.8*  --   --   PLT 260 305  --   --   LABPROT 23.8*  --  28.0* 33.0*  INR 2.15  --  2.64 3.26  CREATININE 6.66* 7.69*  --   --     Estimated Creatinine Clearance: 8.3 mL/min (A) (by C-G formula based on SCr of 7.69 mg/dL (H)).   Assessment: 51 YOM transferred to CIR on 10/22 after a prolonged hospital stay that included CABG/MAZE with AKI and new ESRD. The patient has a hx of Afib and was switched from Apixaban to Warfarin during the last admission  INR 2.64 > 3.26 supratherapeutic despite lower dose yesterday, amiodarone dose decreased to 200 mg BID, cbc stable, No bleeding noted per chart  Goal of Therapy:  INR 2-3 Monitor platelets by anticoagulation protocol: Yes   Plan:  1. Hold coumadin tonight 2. Will continue to monitor for any signs/symptoms of bleeding and will follow up with PT/INR in the a.m.   Thank you for allowing pharmacy to be a part of this patient's care.  Maryanna Shape, PharmD, BCPS  Clinical Pharmacist  Pager: [phone]   11/06/2016 10:15 AM

## 2016-11-06 NOTE — Progress Notes (Signed)
Physical Therapy Session Note  Patient Details  Name: [name] MRN: [id number] Date of Birth: 29-Feb-1930  Today's Date: 11/06/2016 PT Individual Time: 0900-0930 PT Individual Time Calculation (min): 30 min  and Today's Date: 11/06/2016 PT Missed Time: 30 Minutes Missed Time Reason: Patient fatigue;Other (Comment) (low blood pressure and c/o dizziness upon sitting EOB)  Short Term Goals: Week 1:  PT Short Term Goal 1 (Week 1): Pt will participate in >10 minutes of upright activity w/o change in vital signs  PT Short Term Goal 2 (Week 1): Pt will transfer bed<>chair w/ Mod A  PT Short Term Goal 3 (Week 1): Pt will perform all mobility while maintaining sternal precautions  PT Short Term Goal 4 (Week 1): Pt will perform bed mobility w/ Mod A   Skilled Therapeutic Interventions/Progress Updates:   Pt w/ low BP over last 24 hours, medical team and RN agreeable to attempt therapy this morning. Pt in supine upon arrival and agreeable to session, no c/o pain. BP in supine as detailed below, no c/o dizziness and pt willing to sit to EOB. Transferred to EOB w/ Max A and increased time. Maintained static sitting balance at EOB for 5-10 min w/ Min guard to Mod A. BP unchanged and pt c/o of "the room spinning" and feeling "woozie". He did not want to attempt standing. Pt remained symptomatic at end of 10 minutes and requesting to lie back down. Transferred back to supine w/ Max A and pt reporting dizziness sensation going away. Ended session at this point due to pt's inability to tolerate upright and OOB activity this morning. Will attempt to see again this afternoon. Ended session in supine, call bell within reach and all needs met. RN made aware of pt's status.   Therapy Documentation Precautions:  Precautions Precautions: Fall, Sternal Restrictions Weight Bearing Restrictions: Yes (sternal preacutions) RUE Weight Bearing: Weight bearing as tolerated General: PT Amount of Missed Time (min): 30  Minutes PT Missed Treatment Reason: Patient fatigue;Other (Comment) (low blood pressure and c/o dizziness upon sitting EOB) Vital Signs: Therapy Vitals Pulse Rate: 70 BP: (!) 84/43 Patient Position (if appropriate): Lying  See Function Navigator for Current Functional Status.   Therapy/Group: Individual Therapy  Kla Bily K Arnette 11/06/2016, 12:03 PM

## 2016-11-06 NOTE — Progress Notes (Signed)
Thigh TEDS placed, Lorella Nimrod, PA here. Given manuel blood pressure. No new orders.

## 2016-11-06 NOTE — Progress Notes (Signed)
Subjective: Interval History: has complaints tired, but getting stronger.  Objective: Vital signs in last 24 hours: Temp:  [97.5 F (36.4 C)-97.8 F (36.6 C)] 97.8 F (36.6 C) (10/25 0350) Pulse Rate:  [58-63] 63 (10/25 0350) Resp:  [18] 18 (10/25 0350) BP: (70-103)/(37-49) 103/49 (10/25 0350) SpO2:  [95 %-98 %] 95 % (10/25 0350) Weight:  [102.1 kg (225 lb)] 102.1 kg (225 lb) (10/25 0500) Weight change: 8.914 kg (19 lb 10.4 oz)  Intake/Output from previous day: 10/24 0701 - 10/25 0700 In: 400 [P.O.:400] Out: 1858  Intake/Output this shift: No intake/output data recorded.  General appearance: alert, cooperative, no distress, mildly obese and pale Resp: rales bibasilar and rhonchi bibasilar Chest wall: RUJ cath Cardio: S1, S2 normal and systolic murmur: systolic ejection 2/6, decrescendo at 2nd left intercostal space GI: pos bs, liver down 5 cm Extremities: edema 1+ and AVF RUA B&T  Lab Results:  Recent Labs  11/04/16 0541 11/05/16 0502  WBC 7.6 8.3  HGB 8.8* 8.9*  HCT 27.8* 27.8*  PLT 260 305   BMET:  Recent Labs  11/04/16 0541 11/05/16 0502  NA 134* 133*  K 5.8* 6.0*  CL 96* 93*  CO2 24 22  GLUCOSE 79 112*  BUN 80* 98*  CREATININE 6.66* 7.69*  CALCIUM 8.2* 8.3*   No results for input(s): PTH in the last 72 hours. Iron Studies:  Recent Labs  11/05/16 0502  IRON 21*  TIBC 190*    Studies/Results: No results found.  I have reviewed the patient's current medications.  Assessment/Plan: 1 ESRD HD MWF.  Vol xs mod yet  2 CAD, MAZE CABG 3 DM controlled 4 Anemia esa 5 HPTH pending 6 Debill 7 Hep B P HD, esa, check PTH, rehab, PTH  LOS: 3 days   Denine Brotz L 11/06/2016,9:04 AM

## 2016-11-07 ENCOUNTER — Inpatient Hospital Stay (HOSPITAL_COMMUNITY): Payer: Medicare Other | Admitting: Speech Pathology

## 2016-11-07 ENCOUNTER — Inpatient Hospital Stay (HOSPITAL_COMMUNITY): Payer: Medicare Other | Admitting: Physical Therapy

## 2016-11-07 ENCOUNTER — Inpatient Hospital Stay (HOSPITAL_COMMUNITY): Payer: Medicare Other

## 2016-11-07 ENCOUNTER — Inpatient Hospital Stay (HOSPITAL_COMMUNITY): Payer: Medicare Other | Admitting: Occupational Therapy

## 2016-11-07 DIAGNOSIS — I953 Hypotension of hemodialysis: Secondary | ICD-10-CM | POA: Insufficient documentation

## 2016-11-07 LAB — CBC
HEMATOCRIT: 27.3 % — AB (ref 39.0–52.0)
HEMOGLOBIN: 8.8 g/dL — AB (ref 13.0–17.0)
MCH: 29.6 pg (ref 26.0–34.0)
MCHC: 32.2 g/dL (ref 30.0–36.0)
MCV: 91.9 fL (ref 78.0–100.0)
Platelets: 334 10*3/uL (ref 150–400)
RBC: 2.97 MIL/uL — AB (ref 4.22–5.81)
RDW: 16.5 % — ABNORMAL HIGH (ref 11.5–15.5)
WBC: 8.7 10*3/uL (ref 4.0–10.5)

## 2016-11-07 LAB — GLUCOSE, CAPILLARY
GLUCOSE-CAPILLARY: 72 mg/dL (ref 65–99)
GLUCOSE-CAPILLARY: 116 mg/dL — AB (ref 65–99)
Glucose-Capillary: 128 mg/dL — ABNORMAL HIGH (ref 65–99)
Glucose-Capillary: 151 mg/dL — ABNORMAL HIGH (ref 65–99)

## 2016-11-07 LAB — RENAL FUNCTION PANEL
ALBUMIN: 2.3 g/dL — AB (ref 3.5–5.0)
ANION GAP: 15 (ref 5–15)
BUN: 58 mg/dL — ABNORMAL HIGH (ref 6–20)
CALCIUM: 8.1 mg/dL — AB (ref 8.9–10.3)
CO2: 24 mmol/L (ref 22–32)
Chloride: 95 mmol/L — ABNORMAL LOW (ref 101–111)
Creatinine, Ser: 6.86 mg/dL — ABNORMAL HIGH (ref 0.61–1.24)
GFR calc non Af Amer: 7 mL/min — ABNORMAL LOW (ref >60)
GFR, EST AFRICAN AMERICAN: 8 mL/min — AB (ref >60)
GLUCOSE: 84 mg/dL (ref 65–99)
PHOSPHORUS: 7.3 mg/dL — AB (ref 2.5–4.6)
POTASSIUM: 4.7 mmol/L (ref 3.5–5.1)
Sodium: 134 mmol/L — ABNORMAL LOW (ref 135–145)

## 2016-11-07 LAB — PROTIME-INR
INR: 3.39
PROTHROMBIN TIME: 34 s — AB (ref 11.4–15.2)

## 2016-11-07 MED ORDER — TRAMADOL HCL 50 MG PO TABS
ORAL_TABLET | ORAL | Status: AC
  Filled 2016-11-07: qty 1

## 2016-11-07 MED ORDER — ALTEPLASE 2 MG IJ SOLR: 2.0000 mg | Freq: Once | INTRAMUSCULAR | Status: DC | PRN

## 2016-11-07 MED ORDER — SODIUM CHLORIDE 0.9 % IV SOLN: 100.0000 mL | INTRAVENOUS | Status: DC | PRN

## 2016-11-07 MED ORDER — HEPARIN SODIUM (PORCINE) 1000 UNIT/ML DIALYSIS
100.0000 [IU]/kg | INTRAMUSCULAR | Status: DC | PRN
  Filled 2016-11-07: qty 11

## 2016-11-07 MED ORDER — HEPARIN SODIUM (PORCINE) 1000 UNIT/ML DIALYSIS: 1000.0000 [IU] | INTRAMUSCULAR | Status: DC | PRN

## 2016-11-07 MED ORDER — LIDOCAINE HCL (PF) 1 % IJ SOLN: 5.0000 mL | INTRAMUSCULAR | Status: DC | PRN

## 2016-11-07 MED ORDER — PENTAFLUOROPROP-TETRAFLUOROETH EX AERO: 1.0000 "application " | INHALATION_SPRAY | CUTANEOUS | Status: DC | PRN

## 2016-11-07 MED ORDER — LIDOCAINE-PRILOCAINE 2.5-2.5 % EX CREA: 1.0000 "application " | TOPICAL_CREAM | CUTANEOUS | Status: DC | PRN

## 2016-11-07 MED ORDER — MIDODRINE HCL 5 MG PO TABS
ORAL_TABLET | ORAL | Status: AC
  Filled 2016-11-07: qty 2

## 2016-11-07 MED ORDER — ACETAMINOPHEN 325 MG PO TABS
ORAL_TABLET | ORAL | Status: AC
  Filled 2016-11-07: qty 2

## 2016-11-07 NOTE — Progress Notes (Signed)
ANTICOAGULATION CONSULT NOTE - Initial Consult  Pharmacy Consult for Warfarin Indication: atrial fibrillation  No Known Allergies  Patient Measurements: Weight: 225 lb (102.1 kg)  Vital Signs: Temp: 97.7 F (36.5 C) (10/26 0522) Temp Source: Oral (10/26 0522) BP: 107/67 (10/26 0522) Pulse Rate: 80 (10/26 0522)  Labs:  Recent Labs  11/05/16 0502 11/05/16 0503 11/06/16 0738 11/07/16 0518  HGB 8.9*  --   --   --   HCT 27.8*  --   --   --   PLT 305  --   --   --   LABPROT  --  28.0* 33.0* 34.0*  INR  --  2.64 3.26 3.39  CREATININE 7.69*  --   --   --     Estimated Creatinine Clearance: 8.3 mL/min (A) (by C-G formula based on SCr of 7.69 mg/dL (H)).   Assessment: 36 YOM transferred to CIR on 10/22 after a prolonged hospital stay that included CABG/MAZE with AKI and new ESRD. The patient has a hx of Afib and was switched from Apixaban to Warfarin during the last admission  INR 3.39 remains supratherapeutic despite lower dose 10/24 and held 10/25, amiodarone dose decreased to 200 mg BID, cbc stable, No bleeding noted per chart  Goal of Therapy:  INR 2-3 Monitor platelets by anticoagulation protocol: Yes   Plan:  1. Hold coumadin tonight 2. Will continue to monitor for any signs/symptoms of bleeding and will follow up with PT/INR in the a.m.   Thank you for allowing pharmacy to be a part of this patient's care.  Maryanna Shape, PharmD, BCPS  Clinical Pharmacist  Pager: [phone]   11/07/2016 1:19 PM

## 2016-11-07 NOTE — Progress Notes (Signed)
Speech Language Pathology Daily Session Note  Patient Details  Name: [name] MRN: [id number] Date of Birth: [birth year]  Today's Date: 11/07/2016 SLP Individual Time: 0800-0900 SLP Individual Time Calculation (min): 60 min  Short Term Goals: Week 1: SLP Short Term Goal 1 (Week 1): Pt will consume trials of ice chips with minimal overt s/s of aspiration and supervision cues for use of swallowing precautions over 3 consecutive sessions prior to repeat objective assessment.  SLP Short Term Goal 2 (Week 1): Pt will utilize external aids to facilitate recall of daily information with supervision verbal cues.   SLP Short Term Goal 3 (Week 1): Pt will complete semi-complex tasks with supervision verbal cues for functional problem solving.   SLP Short Term Goal 4 (Week 1): Pt will complete 25 repetitions of EMST with mod I and a self perceived effort level of <7/10 over 3 consecutive sessions.   SLP Short Term Goal 5 (Week 1): Pt will consume dys 3 textures and hony thick liquids with mod I use of swallowing precautions and minimal overt s/s of aspiration  Skilled Therapeutic Interventions:  Pt was seen for skilled ST targeting dysphagia goals.  Pt demonstrated x1 instance of delayed coughing during trials of ice chips but it was difficult to determine whether this was directly related to intake as pt had congested cough throughout therapy session even prior to initiating POs.  Pt had intermittently wet vocal quality with dys 3 textures and honey thick liquids which cleared with supervision cues for volitional cough and second swallow.  Pt was able to complete 25 repetitions of EMST with a self perceived effort level of 6/10 and mod I use of device.  Pt was also able to complete 15 repetitions of effortful swallow with supervision cues.  Pt was left in bed with bed alarm set and call bell within reach.  Continue per current plan of care.    Function:  Eating Eating   Modified Consistency Diet:  Yes Eating Assist Level: More than reasonable amount of time           Cognition Comprehension Comprehension assist level: Follows basic conversation/direction with no assist  Expression   Expression assist level: Expresses basic needs/ideas: With no assist  Social Interaction Social Interaction assist level: Interacts appropriately with others with medication or extra time (anti-anxiety, antidepressant).  Problem Solving Problem solving assist level: Solves basic problems with no assist  Memory Memory assist level: Recognizes or recalls 75 - 89% of the time/requires cueing 10 - 24% of the time    Pain Pain Assessment Pain Assessment: No/denies pain  Therapy/Group: Individual Therapy  [name], Selinda Orion 11/07/2016, 9:01 AM

## 2016-11-07 NOTE — Progress Notes (Signed)
[place] PHYSICAL MEDICINE & REHABILITATION     PROGRESS NOTE  Subjective/Complaints:  Patient seen lying in bed this morning.  He states he slept well overnight.  He states he is doing better this morning.  ROS: Denies CP, SOB, N/V/D.  Objective: Vital Signs: Blood pressure 107/67, pulse 80, temperature 97.7 F (36.5 C), temperature source Oral, resp. rate 18, weight 102.1 kg (225 lb), SpO2 96 %. No results found.  Recent Labs  11/05/16 0502  WBC 8.3  HGB 8.9*  HCT 27.8*  PLT 305    Recent Labs  11/05/16 0502  NA 133*  K 6.0*  CL 93*  GLUCOSE 112*  BUN 98*  CREATININE 7.69*  CALCIUM 8.3*   CBG (last 3)   Recent Labs  11/06/16 1627 11/06/16 2141 11/07/16 0638  GLUCAP 103* 164* 128*    Wt Readings from Last 3 Encounters:  11/07/16 102.1 kg (225 lb)  11/03/16 92.6 kg (204 lb 2.3 oz)  10/06/16 99 kg (218 lb 5 oz)    Physical Exam:  BP 107/67 (BP Location: Left Arm)   Pulse 80   Temp 97.7 F (36.5 C) (Oral)   Resp 18   Wt 102.1 kg (225 lb)   SpO2 96%   BMI 34.21 kg/m  Constitutional: He appears well-developed. NAD. Frail  HENT: Normocephalic and atraumatic.  Eyes: EOM are normal. No discharge.  Cardiovascular: RRR.  No JVD. Respiratory: Effort normal and breath sounds normal. + GI: Bowel sounds are normal. He exhibits no distension.  Musculoskeletal: He exhibits no edema or tenderness, including right elbow.  Neurological:  Alert and oriented Motor: 4-/5 throughout (stable) Skin: Skin is warm and dry. Intact.  Psychiatric: Normal mood and behavior.  Assessment/Plan: 1. Functional deficits secondary to debility which require 3+ hours per day of interdisciplinary therapy in a comprehensive inpatient rehab setting. Physiatrist is providing close team supervision and 24 hour management of active medical problems listed below. Physiatrist and rehab team continue to assess barriers to discharge/monitor patient progress toward functional and  medical goals.  Function:  Bathing Bathing position   Position: Bed  Bathing parts Body parts bathed by patient: Right arm, Left arm, Chest, Abdomen, Left upper leg, Right upper leg, Front perineal area Body parts bathed by helper: Buttocks, Right lower leg, Left lower leg, Back  Bathing assist Assist Level: Touching or steadying assistance(Pt > 75%)      Upper Body Dressing/Undressing Upper body dressing   What is the patient wearing?: Pull over shirt/dress     Pull over shirt/dress - Perfomed by patient: Thread/unthread right sleeve, Thread/unthread left sleeve, Put head through opening, Pull shirt over trunk Pull over shirt/dress - Perfomed by helper: Put head through opening, Pull shirt over trunk        Upper body assist Assist Level: Touching or steadying assistance(Pt > 75%) (50%, moderate assist)      Lower Body Dressing/Undressing Lower body dressing   What is the patient wearing?: Pants, Ted Hose, Non-skid slipper socks       Pants- Performed by helper: Thread/unthread right pants leg, Thread/unthread left pants leg, Pull pants up/down   Non-skid slipper socks- Performed by helper: Don/doff right sock, Don/doff left sock               TED Hose - Performed by helper: Don/doff right TED hose, Don/doff left TED hose  Lower body assist Assist for lower body dressing:  (helper completed all, total assist)      Naval architect  activity did not occur: No continent bowel/bladder event   Toileting steps completed by helper: Adjust clothing prior to toileting, Performs perineal hygiene, Adjust clothing after toileting (per Elmo Putt, NT)    Toileting assist     Transfers Chair/bed transfer   Chair/bed transfer method: Stand pivot Chair/bed transfer assist level: Moderate assist (Pt 50 - 74%/lift or lower) Chair/bed transfer assistive device: Medical sales representative Ambulation activity did not occur: Lexicographer activity did not occur: Refused Type: Manual      Cognition Comprehension Comprehension assist level: Follows basic conversation/direction with extra time/assistive device  Expression Expression assist level: Expresses basic needs/ideas: With no assist  Social Interaction Social Interaction assist level: Interacts appropriately with others with medication or extra time (anti-anxiety, antidepressant).  Problem Solving Problem solving assist level: Solves basic problems with no assist  Memory Memory assist level: Recognizes or recalls 90% of the time/requires cueing < 10% of the time    Medical Problem List and Plan: 1.  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 Patient has sternal precautions  Cont CIR 2.  DVT Prophylaxis/Anticoagulation: Chronic Coumadin therapy for history of PAF   INR supratherapeutic on 10/26 3. Pain Management: Neurontin 300 mg twice a day, Ultram as needed 4. Mood: Provide emotional support 5. Neuropsych: This patient is capable of making decisions on his own behalf. 6. Skin/Wound Care: Routine skin checks 7. Fluids/Electrolytes/Nutrition: Routine I&Os 8.AKI/CKD stage 3-4. Follow-up renal services. Presently receiving hemodialysis Monday Wednesday Friday schedule monitoring for recovery of renal function.S/P right IJ tunneled dialysis catheter/right brachiocephalic AV fistula 84/16/6063 9. Acute on chronic anemia. Continue iron supplement  Hb 8.9 on 10/24  Cont to monitor 10. Dysphagia. Dysphagia #3 honey liquids. Monitor hydration.   Advance diet as tolerated 11. Chronic diastolic congestive heart failure. Monitor for any signs of fluid overload.  Filed Weights   11/05/16 0330 11/06/16 0500 11/07/16 0500  Weight: 93.1 kg (205 lb 5.6 oz) 102.1 kg (225 lb) 102.1 kg (225 lb)   ? Reliability  CXR reviewed, suggesting CHF  Cont supplemental 02 12. PAF. Chronic Coumadin, Lopressor 12.5 mg twice a day, amiodarone 200 mg every 12  hours, Midodrine 10 mg 3 times a day  Appreciate cars recs, notes reviewed 13. Diabetes mellitus and peripheral neuropathy. Hemoglobin A1c 7.2. Levemir 28 units twice a day. Check blood sugars before meals and at bedtime.  Improving control on 10/26 14. BPH. Proscar 5 mg daily, Flomax 0.4 mg daily 15. Hyperlipidemia. Lipitor 16. Hypothyroidism. Synthroid 17.  Hypotension limiting therapies on 10/25  Related to HD.  Fluid management per nephro.  LOS (Days) 4 A FACE TO FACE EVALUATION WAS PERFORMED  Darrnell Mangiaracina Lorie Phenix 11/07/2016 7:34 AM

## 2016-11-07 NOTE — Progress Notes (Signed)
Physical Therapy Session Note  Patient Details  Name: [name] MRN: [id number] Date of Birth: December 06, 1930  Today's Date: 11/07/2016 PT Individual Time: 4742-5956 PT Individual Time Calculation (min): 50 min  and Today's Date: 11/07/2016 PT Missed Time: 25 Minutes Missed Time Reason: Patient fatigue (pt w/ increased work of breathing at end of session and reporting fatigue, requesting to end session)  Short Term Goals: Week 1:  PT Short Term Goal 1 (Week 1): Pt will participate in >10 minutes of upright activity w/o change in vital signs  PT Short Term Goal 2 (Week 1): Pt will transfer bed<>chair w/ Mod A  PT Short Term Goal 3 (Week 1): Pt will perform all mobility while maintaining sternal precautions  PT Short Term Goal 4 (Week 1): Pt will perform bed mobility w/ Mod A   Skilled Therapeutic Interventions/Progress Updates:   Pt supine upon arrival and agreeable to therapy, no c/o pain. Worked on functional mobility and tolerance to upright and OOB activity. Pt transferred to EOB w/ Mod A and maintained static sitting balance for 5+ min w/ close supervision. No c/o dizziness w/ upright activity this session. Max A to don LE garments and socks. Min A transfer from EOB to w/c. Worked on gait in hallway in multiple 50-60' bouts using RW and w/c follow. Min guard for gait and verbal cues for safety. Pt required multiple seated rest breaks 2/2 fatigue and increased work of breathing. Pt stating, "I can't breathe", while on 3 L/min O2 and 95-96% O2 sat throughout session. Encouraged pursed lip breathing throughout. Pt requesting to return to room, however agreeable to perform seated exercise in recliner. Returned to room in w/c and transferred to recliner w/ Min guard. Instructed pt on LAQs to perform outside of therapy and educated pt and family on importance of upright and OOB activity. Supervised pt eating ice chips during rest breaks, no issues. Pt falling asleep towards end of session in  recliner. Ended session early 2/2 fatigue and encouraged pt to remain sitting in recliner to increase tolerance to upright activity. Ended session in recliner, call bell within reach and all needs met.   Therapy Documentation Precautions:  Precautions Precautions: Fall, Sternal Restrictions Weight Bearing Restrictions: Yes RUE Weight Bearing: Weight bearing as tolerated General: PT Amount of Missed Time (min): 25 Minutes PT Missed Treatment Reason: Patient fatigue (pt w/ increased work of breathing at end of session and reporting fatigue, requesting to end session) Pain: Pain Assessment Pain Assessment: No/denies pain  See Function Navigator for Current Functional Status.   Therapy/Group: Individual Therapy  Marielle Mantione K Arnette 11/07/2016, 12:39 PM

## 2016-11-07 NOTE — Progress Notes (Signed)
Patient sent to hemodyalisis at 1500

## 2016-11-07 NOTE — Progress Notes (Signed)
Speech Language Pathology Daily Session Note  Patient Details  Name: [name] MRN: [id number] Date of Birth: Jun 05, 1930  Today's Date: 11/07/2016 SLP Individual Time: 1405-1430 SLP Individual Time Calculation (min): 25 min  Short Term Goals: Week 1: SLP Short Term Goal 1 (Week 1): Pt will consume trials of ice chips with minimal overt s/s of aspiration and supervision cues for use of swallowing precautions over 3 consecutive sessions prior to repeat objective assessment.  SLP Short Term Goal 2 (Week 1): Pt will utilize external aids to facilitate recall of daily information with supervision verbal cues.   SLP Short Term Goal 3 (Week 1): Pt will complete semi-complex tasks with supervision verbal cues for functional problem solving.   SLP Short Term Goal 4 (Week 1): Pt will complete 25 repetitions of EMST with mod I and a self perceived effort level of <7/10 over 3 consecutive sessions.   SLP Short Term Goal 5 (Week 1): Pt will consume dys 3 textures and hony thick liquids with mod I use of swallowing precautions and minimal overt s/s of aspiration  Skilled Therapeutic Interventions:  Pt was seen for skilled ST make up session to address cognitive goals.  SLP facilitated the session with money management tasks to address goals for problem solving.  Pt was able to count money with min assist but needed max to total assist to make change due to working memory deficits.  Pt much more fatigued this afternoon in comparison to this morning.  Pt was returned to bed and left with call bell within reach and bed alarm set.  Continue per current plan of care.     Pain Pain Assessment Pain Assessment: No/denies pain  Therapy/Group: Individual Therapy  Dawnell Bryant, Selinda Orion 11/07/2016, 2:36 PM

## 2016-11-07 NOTE — Procedures (Signed)
I was present at this session.  I have reviewed the session itself and made appropriate changes.  HD via PC.  Flow 400. tol HD well.    Naziya Hegwood L 10/26/20183:28 PM

## 2016-11-07 NOTE — Progress Notes (Signed)
Subjective: Interval History: has complaints tired.  Objective: Vital signs in last 24 hours: Temp:  [97.7 F (36.5 C)-98 F (36.7 C)] 97.7 F (36.5 C) (10/26 0522) Pulse Rate:  [67-80] 80 (10/26 0522) Resp:  [18-20] 18 (10/26 0522) BP: (98-108)/(46-67) 107/67 (10/26 0522) SpO2:  [96 %-98 %] 96 % (10/26 0522) Weight:  [102.1 kg (225 lb)] 102.1 kg (225 lb) (10/26 0500) Weight change: 0 kg (0 lb)  Intake/Output from previous day: 10/25 0701 - 10/26 0700 In: 230 [P.O.:230] Out: -  Intake/Output this shift: No intake/output data recorded.  General appearance: cooperative, moderately obese, pale, slowed mentation and audible rhonchi Resp: rales bilaterally and rhonchi bilaterally Chest wall: RIJ cath Cardio: S1, S2 normal and systolic murmur: systolic ejection 2/6, decrescendo at 2nd left intercostal space GI: obese, pos bs, liver down 5 cm Extremities: edema 1+ and AVF RUA  Lab Results:  Recent Labs  11/05/16 0502  WBC 8.3  HGB 8.9*  HCT 27.8*  PLT 305   BMET:  Recent Labs  11/05/16 0502  NA 133*  K 6.0*  CL 93*  CO2 22  GLUCOSE 112*  BUN 98*  CREATININE 7.69*  CALCIUM 8.3*    Recent Labs  11/05/16 0430  PTH 447*   Iron Studies:  Recent Labs  11/05/16 0502  IRON 21*  TIBC 190*    Studies/Results: No results found.  I have reviewed the patient's current medications.  Assessment/Plan: 1 ESRD vol xs mild.  bp low 2 CAD post CABG/MAZE 3 DM controlled 4 Anemia esa/Fe 5 HPTH vit D 6 Severe debill 7 pulm congestion issue on going. P Fe, esa, Hd, CXR    LOS: 4 days   [name] L 11/07/2016,9:54 AM

## 2016-11-07 NOTE — Progress Notes (Signed)
Occupational Therapy Session Note  Patient Details  Name: [name] MRN: [id number] Date of Birth: 09-20-1930  Today's Date: 11/07/2016 OT Individual Time: 1300-1402 OT Individual Time Calculation (min): 62 min    Short Term Goals: Week 1:  OT Short Term Goal 1 (Week 1): Pt will completed toilet transfer with consistent Mod A of 1 OT Short Term Goal 2 (Week 1): Pt will tolerate 1 minute standing at the sink in preparation for BADL tsak OT Short Term Goal 3 (Week 1): Pt will adhere to sternal precautions with min verbal cues during BADL tasks  Skilled Therapeutic Interventions/Progress Updates:    Pt worked on bathing and dressing sit to stand at the sink during session.  PB in supine prior to transfer was 97/46.  Once sitting it increased to 99/45 with no symptoms reported of dizziness.  Min assist for pivot transfer with the RW from bed to the wheelchair.  Mod demonstrational cueing for technique to place one UE on the walker and push up from the bed with the other, in order to maintain sternal precautions.  Oxygen sats at 93% on 3Ls after transfer.  Pt reporting that his breathing has not been as good today.  Pt completed most UB bathing with supervision, except for washing his back, which therapist assisted with.  He was able to cross his LEs over the opposite knee with min assist for removing gripper socks, washing and drying his feet, and donning new gripper socks.  Sit to stand at the sink min to mod assist depending on fatigue level.  In standing, he demonstrated increased trunk flexion with UEs propped at the forearms at times, requiring mod demonstrational cueing for repositioning.  Standing endurance of less than 1 minute during 3 intervals.  Pt left in wheelchair at end of session in preparation for SLP session.  Call button and phone in reach.    Therapy Documentation Precautions:  Precautions Precautions: Fall, Sternal Restrictions Weight Bearing Restrictions: No RUE Weight  Bearing: Weight bearing as tolerated Vital Signs: Therapy Vitals Temp: 98 F (36.7 C) Temp Source: Oral Pulse Rate: 64 Resp: 18 BP: (!) 103/50 Patient Position (if appropriate): Sitting Oxygen Therapy SpO2: 93 % O2 Device: Nasal Cannula O2 Flow Rate (L/min): 3 L/min Pulse Oximetry Type: Intermittent Pain: Pain Assessment Pain Assessment: No/denies pain ADL: See Function Navigator for Current Functional Status.   Therapy/Group: Individual Therapy  Demareon Coldwell OTR/L 11/07/2016, 4:25 PM

## 2016-11-08 ENCOUNTER — Inpatient Hospital Stay (HOSPITAL_COMMUNITY): Payer: Medicare Other | Admitting: Physical Therapy

## 2016-11-08 ENCOUNTER — Inpatient Hospital Stay (HOSPITAL_COMMUNITY): Payer: Medicare Other | Admitting: Speech Pathology

## 2016-11-08 ENCOUNTER — Inpatient Hospital Stay (HOSPITAL_COMMUNITY): Payer: Medicare Other | Admitting: Occupational Therapy

## 2016-11-08 ENCOUNTER — Inpatient Hospital Stay (HOSPITAL_COMMUNITY): Payer: Medicare Other

## 2016-11-08 LAB — GLUCOSE, CAPILLARY
GLUCOSE-CAPILLARY: 48 mg/dL — AB (ref 65–99)
GLUCOSE-CAPILLARY: 69 mg/dL (ref 65–99)
GLUCOSE-CAPILLARY: 111 mg/dL — AB (ref 65–99)
GLUCOSE-CAPILLARY: 173 mg/dL — AB (ref 65–99)
GLUCOSE-CAPILLARY: 41 mg/dL — AB (ref 65–99)
Glucose-Capillary: 93 mg/dL (ref 65–99)
Glucose-Capillary: 71 mg/dL (ref 65–99)
Glucose-Capillary: 79 mg/dL (ref 65–99)

## 2016-11-08 LAB — PROTIME-INR
INR: 3.45
Prothrombin Time: 34.5 seconds — ABNORMAL HIGH (ref 11.4–15.2)

## 2016-11-08 MED ORDER — WARFARIN 0.5 MG HALF TABLET
0.5000 mg | ORAL_TABLET | Freq: Once | ORAL | Status: AC
  Administered 2016-11-08: 0.5 mg via ORAL
  Filled 2016-11-08: qty 1

## 2016-11-08 NOTE — Progress Notes (Signed)
Occupational Therapy Session Note  Patient Details  Name: [name] MRN: [id number] Date of Birth: 1930/06/17  Today's Date: 11/08/2016 OT Individual Time: 5465-6812 OT Individual Time Calculation (min): 28 min    Short Term Goals: Week 1:  OT Short Term Goal 1 (Week 1): Pt will completed toilet transfer with consistent Mod A of 1 OT Short Term Goal 2 (Week 1): Pt will tolerate 1 minute standing at the sink in preparation for BADL tsak OT Short Term Goal 3 (Week 1): Pt will adhere to sternal precautions with min verbal cues during BADL tasks  Skilled Therapeutic Interventions/Progress Updates:    1:1. Pt stand pivot transfer with MOD A for lifting and VC for sternal precaution adherence. OT propels w/c to dayrom for energy conservation. Pt sit to stand 3x throughout session with MOD A for lifting, VC for pursed lip breathing and hip extension/chest elevation. Pt puts seated in between standing trials to participate in preferred leisure activity with VC for lateral lean R while seated EOC without arm rests. Exited session with pt seated in w/c with family present in room and all needs met.  Therapy Documentation Precautions:  Precautions Precautions: Fall, Sternal Restrictions Weight Bearing Restrictions: Yes RUE Weight Bearing: Weight bearing as tolerated  See Function Navigator for Current Functional Status.   Therapy/Group: Individual Therapy  Tonny Branch 11/08/2016, 3:29 PM

## 2016-11-08 NOTE — Progress Notes (Signed)
Subjective: Interval History: has complaints arthritis pain in R elbow, still cough.  Objective: Vital signs in last 24 hours: Temp:  [97.8 F (36.6 C)-98 F (36.7 C)] 97.8 F (36.6 C) (10/27 0500) Pulse Rate:  [64-89] 74 (10/27 0500) Resp:  [18] 18 (10/27 0500) BP: (85-111)/(43-58) 92/43 (10/27 0500) SpO2:  [93 %-98 %] 96 % (10/27 0500) Weight:  [100 kg (220 lb 7.4 oz)-102 kg (224 lb 13.9 oz)] 100 kg (220 lb 7.4 oz) (10/26 1928) Weight change: -0.059 kg (-2.1 oz)  Intake/Output from previous day: 10/26 0701 - 10/27 0700 In: 350 [P.O.:240; IV Piggyback:110] Out: 2000  Intake/Output this shift: Total I/O In: 60 [P.O.:60] Out: -   General appearance: cooperative, no distress, moderately obese and pale Resp: diminished breath sounds bilaterally, rales bibasilar and rhonchi bibasilar Chest wall: RIJ PC Cardio: S1, S2 normal and systolic murmur: systolic ejection 2/6, decrescendo at 2nd left intercostal space GI: obese, pos bs, liver down 4 cm , soft Extremities: edema 1+ and AVF RUA  Lab Results:  Recent Labs  11/07/16 1555  WBC 8.7  HGB 8.8*  HCT 27.3*  PLT 334   BMET:  Recent Labs  11/07/16 1549  NA 134*  K 4.7  CL 95*  CO2 24  GLUCOSE 84  BUN 58*  CREATININE 6.86*  CALCIUM 8.1*   No results for input(s): PTH in the last 72 hours. Iron Studies: No results for input(s): IRON, TIBC, TRANSFERRIN, FERRITIN in the last 72 hours.  Studies/Results: Dg Chest 2 View  Result Date: 11/07/2016 CLINICAL DATA:  Congestive heart failure EXAM: CHEST  2 VIEW COMPARISON:  November 03, 2016 FINDINGS: There is stable interstitial pulmonary edema. There is atelectatic change in the left base. There is no airspace consolidation. There is cardiomegaly with mild pulmonary venous hypertension. Central catheter tip is in the superior vena cava. No pneumothorax. No adenopathy. There is aortic atherosclerosis. There is evidence of coronary artery bypass grafting. There is a left atrial  appendage clamp. There is a total shoulder prosthesis on the left. There is postoperative change in the right shoulder. IMPRESSION: Stable cardiomegaly with interstitial edema. No new opacity. There is atelectatic change in left base. No consolidation. Central catheter tip in superior vena cava.  No pneumothorax. There is aortic atherosclerosis. Aortic Atherosclerosis (ICD10-I70.0). Electronically Signed   By: Lowella Grip III M.D.   On: 11/07/2016 10:45    I have reviewed the patient's current medications.  Assessment/Plan: 1 ESRD HD MWF.  Stable, slowly lower vol.  bp limits 2 Pulm congestion  Atel, poor ms strength 3 Anemia esa/Fe 4 HPTH vit D 5 CAD/CABG/MAZE per CVS/Cards 6 DM controlled 7 Severe debill P HD on Mon, esa, mobilize    LOS: 5 days   Nayzeth Altman L 11/08/2016,11:28 AM

## 2016-11-08 NOTE — Progress Notes (Signed)
ANTICOAGULATION CONSULT NOTE - Initial Consult  Pharmacy Consult for Warfarin Indication: atrial fibrillation  No Known Allergies  Patient Measurements: Height: 5\' 8"  (172.7 cm) Weight: 220 lb 7.4 oz (100 kg) IBW/kg (Calculated) : 68.4  Vital Signs: Temp: 97.8 F (36.6 C) (10/27 0500) Temp Source: Oral (10/27 0500) BP: 92/43 (10/27 0500) Pulse Rate: 74 (10/27 0500)  Labs:  Recent Labs  11/06/16 0738 11/07/16 0518 11/07/16 1549 11/07/16 1555 11/08/16 0545  HGB  --   --   --  8.8*  --   HCT  --   --   --  27.3*  --   PLT  --   --   --  334  --   LABPROT 33.0* 34.0*  --   --  34.5*  INR 3.26 3.39  --   --  3.45  CREATININE  --   --  6.86*  --   --     Estimated Creatinine Clearance: 9.2 mL/min (A) (by C-G formula based on SCr of 6.86 mg/dL (H)).   Assessment: 71 yoM transferred to CIR on 10/22 after a prolonged hospital stay that included CABG/MAZE with AKI and new ESRD. He has a h/o afib on apixaban PTA, which was switched to warfarin during the last admission. INR remains above goal at 3.45 despite holding warfarin x2. On amiodarone 200mg  BID. Hgb 8.8 stable, pltc WNL. No bleeding noted.  Goal of Therapy:  INR 2-3 Monitor platelets by anticoagulation protocol: Yes   Plan:  Coumadin 0.5mg  x1 Daily INR, monitor for s/sx of bleeding  Erin N. Gerarda Fraction, PharmD PGY1 Pharmacy Resident Pager: [phone] 11/08/2016 8:03 AM

## 2016-11-08 NOTE — Progress Notes (Signed)
Speech Language Pathology Daily Session Note  Patient Details  Name: [name] MRN: [id number] Date of Birth: September 10, 1930  Today's Date: 11/08/2016 SLP Individual Time: 1415-1445 SLP Individual Time Calculation (min): 30 min  Short Term Goals: Week 1: SLP Short Term Goal 1 (Week 1): Pt will consume trials of ice chips with minimal overt s/s of aspiration and supervision cues for use of swallowing precautions over 3 consecutive sessions prior to repeat objective assessment.  SLP Short Term Goal 2 (Week 1): Pt will utilize external aids to facilitate recall of daily information with supervision verbal cues.   SLP Short Term Goal 3 (Week 1): Pt will complete semi-complex tasks with supervision verbal cues for functional problem solving.   SLP Short Term Goal 4 (Week 1): Pt will complete 25 repetitions of EMST with mod I and a self perceived effort level of <7/10 over 3 consecutive sessions.   SLP Short Term Goal 5 (Week 1): Pt will consume dys 3 textures and hony thick liquids with mod I use of swallowing precautions and minimal overt s/s of aspiration  Skilled Therapeutic Interventions: Skilled treatment session focused on cognition goals. SLP facilitated session by providing Total A to arouse pt and Mod A to keep alertness. Pt too fatigue to perform EMST but able to perform 3 reps of incentive spirometer. Pt able to count basic money but required Max a for simple management tasks for problem solving goal. Pt was handed off to PT.      Function:   Cognition Comprehension Comprehension assist level: Follows basic conversation/direction with no assist  Expression   Expression assist level: Expresses basic needs/ideas: With no assist  Social Interaction Social Interaction assist level: Interacts appropriately with others with medication or extra time (anti-anxiety, antidepressant).  Problem Solving Problem solving assist level: Solves basic problems with no assist  Memory Memory assist  level: Recognizes or recalls 75 - 89% of the time/requires cueing 10 - 24% of the time    Pain    Therapy/Group: Individual Therapy  [name] 11/08/2016, 2:50 PM

## 2016-11-08 NOTE — Progress Notes (Signed)
Physical Therapy Session Note  Patient Details  Name: [name] MRN: [id number] Date of Birth: 04/05/1930  Today's Date: 11/08/2016 PT Individual Time: 0800-0855 AND 1445-1500 AND 1600-1625 PT Individual Time Calculation (min): 55 min AND 15 min  Short Term Goals: Week 1:  PT Short Term Goal 1 (Week 1): Pt will participate in >10 minutes of upright activity w/o change in vital signs  PT Short Term Goal 2 (Week 1): Pt will transfer bed<>chair w/ Mod A  PT Short Term Goal 3 (Week 1): Pt will perform all mobility while maintaining sternal precautions  PT Short Term Goal 4 (Week 1): Pt will perform bed mobility w/ Mod A   Skilled Therapeutic Interventions/Progress Updates:   Session 1:  Pt supine upon arrival and agreeable to therapy, no c/o pain. Worked on tolerance to upright activity this session, cardiovascular endurance, and breathing techniques. Breakfast on tray, encouraged pt to sit at EOB to eat breakfast. Transferred to EOB w/ Mod A and maintained static sitting balance w/ supervision to Min guard while pt ate breakfast, ~5 min. Pt requesting to lie back down due to fatigue, however agreeable to transfer to recliner for back support. Transferred w/ Mod A overall 2/2 low sit<>stand surface. Pt w/ moderate increase in work of breathing w/ mobility. Verbal cues for pursed lip breathing and to slow breathing rate. Pt back to baseline w/ in a few minutes. No signs or symptoms of O2 desat on 2 L/min O2 throughout session. Performed UE tasks sitting in recliner w/ intermittent rests to lean back for back support. Read newspaper, took medication from RN, and performed LE exercises - LAQs in sets of 10. Pt continues to require frequent rest breaks w/ back support, however tolerance to upright activity improved. Ambulated 30' w/o w/c follow, pt w/ increased anxiety because of this but no LOB. Pt refusing to participate in any further activity and wanting to lay back and rest after walk. Ended  session in recliner, call bell within reach and all needs met.   Session 2:  Pt supine upon arrival and agreeable to therapy, no c/o pain. Making up time for missed time this morning. Transferred to EOB w/ increased time and worked on breathing strategies before ambulating into hallway w/ RW and min guard, 50'. Returned to room and ended session in recliner. Educated pt and family on importance of upright and OOB activity to assist w/ breathing and tolerance to OOB activity. Pt in care of family, all needs met.   Session 3: Pt in w/c upon arrival and agreeable to therapy, no c/o pain. Total A w/c transport to/from gym. Practiced performing stairs w/ Mod A overall for max encouragement and verbal cues for posture and breathing. Negotiated 4 stairs up and required seated rest on chair at top of stairs before returning to bottom. No increase in work of breathing, pt w/ increased anxiety however. Returned to bottom of 4 steps w/ Mod A. Pt felt encouraged by his progress and felt accomplished after negotiating stairs. Decreased anxiety once seated back in w/c. Returned to room and transferred to EOB w/ Min A and ended session in supine and in care of family, all needs met. Bed tilted up almost to seated position to decreased work of breathing and promote clearance of secretions. Educated family on this and all in agreement.   Therapy Documentation Precautions:  Precautions Precautions: Fall, Sternal Restrictions Weight Bearing Restrictions: Yes RUE Weight Bearing: Weight bearing as tolerated Vital Signs: Therapy Vitals Temp: 97.8   F (36.6 C) Temp Source: Oral Pulse Rate: 74 Resp: 18 BP: (!) 92/43 Patient Position (if appropriate): Lying Oxygen Therapy SpO2: 96 % O2 Device: Not Delivered  See Function Navigator for Current Functional Status.   Therapy/Group: Individual Therapy  Ehsan Corvin K Arnette 11/08/2016, 8:58 AM

## 2016-11-08 NOTE — Progress Notes (Signed)
Occupational Therapy Session Note  Patient Details  Name: [name] MRN: [id number] Date of Birth: 1930-10-12  Today's Date: 11/08/2016 OT Individual Time: 1002-1103 OT Individual Time Calculation (min): 61 min    Short Term Goals: Week 1:  OT Short Term Goal 1 (Week 1): Pt will completed toilet transfer with consistent Mod A of 1 OT Short Term Goal 2 (Week 1): Pt will tolerate 1 minute standing at the sink in preparation for BADL tsak OT Short Term Goal 3 (Week 1): Pt will adhere to sternal precautions with min verbal cues during BADL tasks  Skilled Therapeutic Interventions/Progress Updates:    Pt completed toilet transfer and toileting with mod assist to the 3:1 over the toilet.  Oxygen sats maintained at 93% on 3Ls during transfer but pt with overall decreased endurance and dyspnea 2/4.  He needed total assist for clothing management and toilet hygiene before transferring to the wheelchair with min assist.  Next had pt perform bathing and dressing at the sink.  Supervision for UB bathing except for washing her back.  He needed min assist for donning pullover shirt.  He completed LB bathing with mod assist sit to stand.  Mod  demonstrational cueing for sequencing sit to stand in order to maintain sternal precautions.  Min assist needed for crossing and maintaining LEs over the opposite knee for washing feet.  Mod assist for donning gripper socks after therapist assisted with TEDs.  Finished session with stand pivot transfer back to the bed with min assist.  Pt left with call button and phone in reach and family present as well. No reports of dizziness noted during session.   Therapy Documentation Precautions:  Precautions Precautions: Fall, Sternal Restrictions Weight Bearing Restrictions: Yes RUE Weight Bearing: Weight bearing as tolerated  Pain: Pain Assessment Pain Assessment: No/denies pain ADL: See Function Navigator for Current Functional Status.   Therapy/Group:  Individual Therapy  Yovan Leeman OTR/L 11/08/2016, 12:30 PM

## 2016-11-08 NOTE — Progress Notes (Signed)
[place] PHYSICAL MEDICINE & REHABILITATION     PROGRESS NOTE  Subjective/Complaints:  No new issues. Sleeping soundly  ROS: pt denies nausea, vomiting, diarrhea, cough, shortness of breath or chest pain   Objective: Vital Signs: Blood pressure (!) 92/43, pulse 74, temperature 97.8 F (36.6 C), temperature source Oral, resp. rate 18, height 5\' 8"  (1.727 m), weight 100 kg (220 lb 7.4 oz), SpO2 96 %. Dg Chest 2 View  Result Date: 11/07/2016 CLINICAL DATA:  Congestive heart failure EXAM: CHEST  2 VIEW COMPARISON:  November 03, 2016 FINDINGS: There is stable interstitial pulmonary edema. There is atelectatic change in the left base. There is no airspace consolidation. There is cardiomegaly with mild pulmonary venous hypertension. Central catheter tip is in the superior vena cava. No pneumothorax. No adenopathy. There is aortic atherosclerosis. There is evidence of coronary artery bypass grafting. There is a left atrial appendage clamp. There is a total shoulder prosthesis on the left. There is postoperative change in the right shoulder. IMPRESSION: Stable cardiomegaly with interstitial edema. No new opacity. There is atelectatic change in left base. No consolidation. Central catheter tip in superior vena cava.  No pneumothorax. There is aortic atherosclerosis. Aortic Atherosclerosis (ICD10-I70.0). Electronically Signed   By: Lowella Grip III M.D.   On: 11/07/2016 10:45    Recent Labs  11/07/16 1555  WBC 8.7  HGB 8.8*  HCT 27.3*  PLT 334    Recent Labs  11/07/16 1549  NA 134*  K 4.7  CL 95*  GLUCOSE 84  BUN 58*  CREATININE 6.86*  CALCIUM 8.1*   CBG (last 3)   Recent Labs  11/07/16 2221 11/08/16 0620 11/08/16 0640  GLUCAP 116* 48* 93    Wt Readings from Last 3 Encounters:  11/07/16 100 kg (220 lb 7.4 oz)  11/03/16 92.6 kg (204 lb 2.3 oz)  10/06/16 99 kg (218 lb 5 oz)    Physical Exam:  BP (!) 92/43 (BP Location: Left Arm)   Pulse 74   Temp 97.8 F (36.6 C)  (Oral)   Resp 18   Ht 5\' 8"  (1.727 m)   Wt 100 kg (220 lb 7.4 oz)   SpO2 96%   BMI 33.52 kg/m  Constitutional: He appears well-developed. NAD. Frail  HENT: Normocephalic and atraumatic.  Eyes: EOM are normal. No discharge.  Cardiovascular: RRR without murmur. No JVD . Respiratory: normal effort GI: Bowel sounds are normal. He exhibits no distension.  Musculoskeletal: He exhibits no edema or tenderness, including right elbow.  Neurological:  Alert and oriented Motor: 4-/5 throughout (stable) Skin: Skin is warm and dry. Intact.  Psychiatric: Normal mood and behavior.  Assessment/Plan: 1. Functional deficits secondary to debility which require 3+ hours per day of interdisciplinary therapy in a comprehensive inpatient rehab setting. Physiatrist is providing close team supervision and 24 hour management of active medical problems listed below. Physiatrist and rehab team continue to assess barriers to discharge/monitor patient progress toward functional and medical goals.  Function:  Bathing Bathing position   Position: Wheelchair/chair at sink  Bathing parts Body parts bathed by patient: Right arm, Left arm, Chest, Abdomen, Front perineal area, Right upper leg, Left upper leg, Right lower leg, Left lower leg Body parts bathed by helper: Back, Buttocks  Bathing assist Assist Level: Touching or steadying assistance(Pt > 75%)      Upper Body Dressing/Undressing Upper body dressing   What is the patient wearing?: Pull over shirt/dress     Pull over shirt/dress - Perfomed by patient:  Thread/unthread right sleeve, Thread/unthread left sleeve Pull over shirt/dress - Perfomed by helper: Put head through opening, Pull shirt over trunk        Upper body assist Assist Level: Touching or steadying assistance(Pt > 75%) (50%, moderate assist)      Lower Body Dressing/Undressing Lower body dressing   What is the patient wearing?: Pants, Non-skid slipper socks, Ted Hose     Pants-  Performed by patient: Thread/unthread left pants leg, Thread/unthread right pants leg Pants- Performed by helper: Pull pants up/down   Non-skid slipper socks- Performed by helper: Don/doff right sock, Don/doff left sock               TED Hose - Performed by helper: Don/doff right TED hose, Don/doff left TED hose  Lower body assist Assist for lower body dressing:  (helper completed all, total assist)      Toileting Toileting Toileting activity did not occur: No continent bowel/bladder event   Toileting steps completed by helper: Adjust clothing prior to toileting, Performs perineal hygiene, Adjust clothing after toileting Toileting Assistive Devices: Grab bar or rail  Toileting assist Assist level: Two helpers   Transfers Chair/bed transfer   Chair/bed transfer method: Stand pivot Chair/bed transfer assist level: Touching or steadying assistance (Pt > 75%) Chair/bed transfer assistive device: Armrests, Medical sales representative Ambulation activity did not occur: Refused   Max distance: 78' Assist level: Touching or steadying assistance (Pt > 75%)   Wheelchair Wheelchair activity did not occur: Refused Type: Manual      Cognition Comprehension Comprehension assist level: Follows basic conversation/direction with no assist  Expression Expression assist level: Expresses basic needs/ideas: With no assist  Social Interaction Social Interaction assist level: Interacts appropriately with others with medication or extra time (anti-anxiety, antidepressant).  Problem Solving Problem solving assist level: Solves basic problems with no assist  Memory Memory assist level: Recognizes or recalls 75 - 89% of the time/requires cueing 10 - 24% of the time    Medical Problem List and Plan: 1.  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 Patient has sternal precautions  Cont CIR 2.  DVT Prophylaxis/Anticoagulation: Chronic Coumadin therapy for history of  PAF   INR supratherapeutic on 10/27 3. Pain Management: Neurontin 300 mg twice a day, Ultram as needed 4. Mood: Provide emotional support 5. Neuropsych: This patient is capable of making decisions on his own behalf. 6. Skin/Wound Care: Routine skin checks 7. Fluids/Electrolytes/Nutrition: Routine I&Os 8.AKI/CKD stage 3-4. Follow-up renal services. Presently receiving hemodialysis Monday Wednesday Friday schedule monitoring for recovery of renal function.S/P right IJ tunneled dialysis catheter/right brachiocephalic AV fistula 08/65/7846 9. Acute on chronic anemia. Continue iron supplement  Hb 8.9 on 10/24  Cont to monitor 10. Dysphagia. Dysphagia #3 honey liquids. Monitor hydration.   Advance diet as tolerated 11. Chronic diastolic congestive heart failure. Monitor for any signs of fluid overload.  Filed Weights   11/07/16 0500 11/07/16 1507 11/07/16 1928  Weight: 102.1 kg (225 lb) 102 kg (224 lb 13.9 oz) 100 kg (220 lb 7.4 oz)   ? Reliability  CXR reviewed, suggesting CHF  Cont supplemental 02 12. PAF. Chronic Coumadin, Lopressor 12.5 mg twice a day, amiodarone 200 mg every 12 hours, Midodrine 10 mg 3 times a day  Appreciate cards recs, notes reviewed 13. Diabetes mellitus and peripheral neuropathy. Hemoglobin A1c 7.2. Levemir 28 units twice a day. Check blood sugars before meals and at bedtime.  Improving control on 10/26 14. BPH. Proscar 5 mg daily, Flomax  0.4 mg daily 15. Hyperlipidemia. Lipitor 16. Hypothyroidism. Synthroid 17.  Hypotension limiting therapies on 10/25  Related to HD.  Fluid management per nephro.  LOS (Days) 5 A FACE TO FACE EVALUATION WAS PERFORMED  Dhanya Bogle T 11/08/2016 8:03 AM

## 2016-11-08 NOTE — Progress Notes (Signed)
Telephone call to Dr Naaman Plummer concerning regurgitated food when gagging on postnasal drip. Congested. Otherwise stable. No new orders.

## 2016-11-08 NOTE — Progress Notes (Signed)
F/s- 41. Hs snack given. F/s up to 71. Will continue to monitor.

## 2016-11-09 ENCOUNTER — Inpatient Hospital Stay (HOSPITAL_COMMUNITY): Payer: Medicare Other | Admitting: Speech Pathology

## 2016-11-09 ENCOUNTER — Inpatient Hospital Stay (HOSPITAL_COMMUNITY): Payer: Medicare Other

## 2016-11-09 DIAGNOSIS — I5020 Unspecified systolic (congestive) heart failure: Secondary | ICD-10-CM

## 2016-11-09 DIAGNOSIS — R110 Nausea: Secondary | ICD-10-CM

## 2016-11-09 LAB — GLUCOSE, CAPILLARY
GLUCOSE-CAPILLARY: 82 mg/dL (ref 65–99)
GLUCOSE-CAPILLARY: 142 mg/dL — AB (ref 65–99)
Glucose-Capillary: 72 mg/dL (ref 65–99)
Glucose-Capillary: 76 mg/dL (ref 65–99)
Glucose-Capillary: 81 mg/dL (ref 65–99)
Glucose-Capillary: 170 mg/dL — ABNORMAL HIGH (ref 65–99)

## 2016-11-09 LAB — PROTIME-INR
INR: 3.25
PROTHROMBIN TIME: 32.9 s — AB (ref 11.4–15.2)

## 2016-11-09 MED ORDER — WARFARIN 0.5 MG HALF TABLET
0.5000 mg | ORAL_TABLET | Freq: Once | ORAL | Status: AC
  Administered 2016-11-09: 0.5 mg via ORAL
  Filled 2016-11-09: qty 1

## 2016-11-09 MED ORDER — INSULIN DETEMIR 100 UNIT/ML ~~LOC~~ SOLN
20.0000 [IU] | Freq: Every day | SUBCUTANEOUS | Status: DC
  Administered 2016-11-09 – 2016-11-20 (×12): 20 [IU] via SUBCUTANEOUS
  Filled 2016-11-09 (×12): qty 0.2

## 2016-11-09 MED ORDER — METOCLOPRAMIDE HCL 10 MG PO TABS
5.0000 mg | ORAL_TABLET | Freq: Three times a day (TID) | ORAL | Status: DC
  Administered 2016-11-09 – 2016-11-10 (×3): 5 mg via ORAL
  Filled 2016-11-09 (×3): qty 1

## 2016-11-09 MED ORDER — INSULIN DETEMIR 100 UNIT/ML ~~LOC~~ SOLN: 20.0000 [IU] | Freq: Every day | SUBCUTANEOUS | Status: DC

## 2016-11-09 NOTE — Progress Notes (Addendum)
[place] PHYSICAL MEDICINE & REHABILITATION     PROGRESS NOTE  Subjective/Complaints:  Incontinent of stool this morning. RN had given him sorbitol this morning for constipation. Having nausea/vomiting with a lot of his meals.   ROS: pt denies nausea, vomiting, diarrhea, cough, shortness of breath or chest pain   Objective: Vital Signs: Blood pressure (!) 98/39, pulse 76, temperature 98.2 F (36.8 C), temperature source Oral, resp. rate 18, height 5\' 8"  (1.727 m), weight 102.5 kg (226 lb), SpO2 97 %. Dg Chest 2 View  Result Date: 11/07/2016 CLINICAL DATA:  Congestive heart failure EXAM: CHEST  2 VIEW COMPARISON:  November 03, 2016 FINDINGS: There is stable interstitial pulmonary edema. There is atelectatic change in the left base. There is no airspace consolidation. There is cardiomegaly with mild pulmonary venous hypertension. Central catheter tip is in the superior vena cava. No pneumothorax. No adenopathy. There is aortic atherosclerosis. There is evidence of coronary artery bypass grafting. There is a left atrial appendage clamp. There is a total shoulder prosthesis on the left. There is postoperative change in the right shoulder. IMPRESSION: Stable cardiomegaly with interstitial edema. No new opacity. There is atelectatic change in left base. No consolidation. Central catheter tip in superior vena cava.  No pneumothorax. There is aortic atherosclerosis. Aortic Atherosclerosis (ICD10-I70.0). Electronically Signed   By: Lowella Grip III M.D.   On: 11/07/2016 10:45    Recent Labs  11/07/16 1555  WBC 8.7  HGB 8.8*  HCT 27.3*  PLT 334    Recent Labs  11/07/16 1549  NA 134*  K 4.7  CL 95*  GLUCOSE 84  BUN 58*  CREATININE 6.86*  CALCIUM 8.1*   CBG (last 3)   Recent Labs  11/09/16 0334 11/09/16 0610 11/09/16 0624  GLUCAP 72 76 82    Wt Readings from Last 3 Encounters:  11/09/16 102.5 kg (226 lb)  11/03/16 92.6 kg (204 lb 2.3 oz)  10/06/16 99 kg (218 lb 5 oz)     Physical Exam:  BP (!) 98/39 (BP Location: Left Arm)   Pulse 76   Temp 98.2 F (36.8 C) (Oral)   Resp 18   Ht 5\' 8"  (1.727 m)   Wt 102.5 kg (226 lb)   SpO2 97%   BMI 34.36 kg/m  Constitutional: He appears well-developed. NAD. Frail  HENT: Normocephalic and atraumatic.  Eyes: EOM are normal. No discharge.  Cardiovascular: RRR without murmur. No JVD  . Respiratory: CTA Bilaterally without wheezes or rales. Normal effort  GI: BS +, non-tender, non-distended   Musculoskeletal: He exhibits no edema or tenderness, including right elbow.  Neurological:  Alert and oriented Motor: 4-/5 throughout (stable) Skin: Skin is warm and dry. Intact.  Psychiatric: Normal mood and behavior.  Assessment/Plan: 1. Functional deficits secondary to debility which require 3+ hours per day of interdisciplinary therapy in a comprehensive inpatient rehab setting. Physiatrist is providing close team supervision and 24 hour management of active medical problems listed below. Physiatrist and rehab team continue to assess barriers to discharge/monitor patient progress toward functional and medical goals.  Function:  Bathing Bathing position   Position: Wheelchair/chair at sink  Bathing parts Body parts bathed by patient: Right arm, Left arm, Chest, Abdomen, Front perineal area, Right upper leg, Left upper leg, Right lower leg, Left lower leg Body parts bathed by helper: Buttocks, Back  Bathing assist Assist Level: Touching or steadying assistance(Pt > 75%)      Upper Body Dressing/Undressing Upper body dressing   What is  the patient wearing?: Pull over shirt/dress     Pull over shirt/dress - Perfomed by patient: Thread/unthread right sleeve, Thread/unthread left sleeve, Put head through opening Pull over shirt/dress - Perfomed by helper: Pull shirt over trunk        Upper body assist Assist Level: Touching or steadying assistance(Pt > 75%) (50%, moderate assist)      Lower Body  Dressing/Undressing Lower body dressing   What is the patient wearing?: Pants, Non-skid slipper socks, Ted Hose     Pants- Performed by patient: Thread/unthread right pants leg, Thread/unthread left pants leg Pants- Performed by helper: Pull pants up/down   Non-skid slipper socks- Performed by helper: Don/doff right sock, Don/doff left sock               TED Hose - Performed by helper: Don/doff right TED hose, Don/doff left TED hose  Lower body assist Assist for lower body dressing:  (helper completed all, total assist)      Toileting Toileting Toileting activity did not occur: No continent bowel/bladder event   Toileting steps completed by helper: Adjust clothing prior to toileting, Performs perineal hygiene, Adjust clothing after toileting Toileting Assistive Devices: Grab bar or rail  Toileting assist Assist level: Two helpers   Transfers Chair/bed transfer   Chair/bed transfer method: Stand pivot Chair/bed transfer assist level: Touching or steadying assistance (Pt > 75%) Chair/bed transfer assistive device: Medical sales representative Ambulation activity did not occur: Refused   Max distance: 30' Assist level: Touching or steadying assistance (Pt > 75%)   Wheelchair Wheelchair activity did not occur: Refused Type: Manual      Cognition Comprehension Comprehension assist level: Follows basic conversation/direction with no assist  Expression Expression assist level: Expresses basic needs/ideas: With no assist  Social Interaction Social Interaction assist level: Interacts appropriately with others with medication or extra time (anti-anxiety, antidepressant).  Problem Solving Problem solving assist level: Solves basic problems with no assist  Memory Memory assist level: Recognizes or recalls 75 - 89% of the time/requires cueing 10 - 24% of the time    Medical Problem List and Plan: 1.  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  Patient has sternal precautions  Cont CIR 2.  DVT Prophylaxis/Anticoagulation: Chronic Coumadin therapy for history of PAF   INR supratherapeutic on 10/2 3.25 3. Pain Management: Neurontin 300 mg twice a day, Ultram as needed 4. Mood: Provide emotional support 5. Neuropsych: This patient is capable of making decisions on his own behalf. 6. Skin/Wound Care: Routine skin checks 7. Fluids/Electrolytes/Nutrition: Routine I&Os 8.AKI/CKD stage 3-4. Follow-up renal services. Presently receiving hemodialysis Monday Wednesday Friday schedule monitoring for recovery of renal function.S/P right IJ tunneled dialysis catheter/right brachiocephalic AV fistula 69/62/9528 9. Acute on chronic anemia. Continue iron supplement  Hb 8.9 on 10/24  Cont to monitor 10. Dysphagia. Dysphagia #3 honey liquids. Monitor hydration.   Advance diet as tolerated 11. Chronic diastolic congestive heart failure. Monitor for any signs of fluid overload.  Filed Weights   11/07/16 1507 11/07/16 1928 11/09/16 0537  Weight: 102 kg (224 lb 13.9 oz) 100 kg (220 lb 7.4 oz) 102.5 kg (226 lb)   ? Reliability  CXR reviewed, suggesting CHF  Cont supplemental  oxygen 12. PAF. Chronic Coumadin, Lopressor 12.5 mg twice a day, amiodarone 200 mg every 12 hours, Midodrine 10 mg 3 times a day  Appreciate cards recs, notes reviewed 13. Diabetes mellitus and peripheral neuropathy. Hemoglobin A1c 7.2. Levemir 28 units twice a day. Check blood  sugars before meals and at bedtime.  Hypoglycemic over night. In general sugars low.   -reduce levemir to 20 u daily 14. BPH. Proscar 5 mg daily, Flomax 0.4 mg daily 15. Hyperlipidemia. Lipitor 16. Hypothyroidism. Synthroid 17.  Hypotension limiting therapies on 10/25  Related to HD.  Fluid management per nephro. 18. Persistent nausea  -check KUB today  -schedule reglan before meals  -+BM this morning  -?behavioral component  LOS (Days) 6 A FACE TO FACE EVALUATION WAS PERFORMED  Dorthy Magnussen  T 11/09/2016 8:10 AM

## 2016-11-09 NOTE — Progress Notes (Signed)
Subjective: Interval History: has complaints , upset stom, V x2.  Objective: Vital signs in last 24 hours: Temp:  [97.6 F (36.4 C)-98.2 F (36.8 C)] 98.2 F (36.8 C) (10/28 0537) Pulse Rate:  [75-76] 76 (10/28 0537) Resp:  [18-20] 18 (10/28 0537) BP: (98-100)/(39-40) 98/39 (10/28 0537) SpO2:  [97 %] 97 % (10/28 0537) Weight:  [102.5 kg (226 lb)] 102.5 kg (226 lb) (10/28 0537) Weight change: 0.513 kg (1 lb 2.1 oz)  Intake/Output from previous day: 10/27 0701 - 10/28 0700 In: 180 [P.O.:180] Out: -  Intake/Output this shift: No intake/output data recorded.  General appearance: alert, cooperative, no distress, moderately obese and pale Resp: diminished breath sounds bilaterally and rales bibasilar Chest wall: RIJ cath Cardio: regular rate and rhythm, S1, S2 normal and systolic murmur: systolic ejection 2/6, decrescendo at 2nd left intercostal space GI: mild distension, decreased bs, nontender Extremities: edema 1+, AVF RUA  Lab Results:  Recent Labs  11/07/16 1555  WBC 8.7  HGB 8.8*  HCT 27.3*  PLT 334   BMET:  Recent Labs  11/07/16 1549  NA 134*  K 4.7  CL 95*  CO2 24  GLUCOSE 84  BUN 58*  CREATININE 6.86*  CALCIUM 8.1*   No results for input(s): PTH in the last 72 hours. Iron Studies: No results for input(s): IRON, TIBC, TRANSFERRIN, FERRITIN in the last 72 hours.  Studies/Results: Dg Chest 2 View  Result Date: 11/07/2016 CLINICAL DATA:  Congestive heart failure EXAM: CHEST  2 VIEW COMPARISON:  November 03, 2016 FINDINGS: There is stable interstitial pulmonary edema. There is atelectatic change in the left base. There is no airspace consolidation. There is cardiomegaly with mild pulmonary venous hypertension. Central catheter tip is in the superior vena cava. No pneumothorax. No adenopathy. There is aortic atherosclerosis. There is evidence of coronary artery bypass grafting. There is a left atrial appendage clamp. There is a total shoulder prosthesis on the  left. There is postoperative change in the right shoulder. IMPRESSION: Stable cardiomegaly with interstitial edema. No new opacity. There is atelectatic change in left base. No consolidation. Central catheter tip in superior vena cava.  No pneumothorax. There is aortic atherosclerosis. Aortic Atherosclerosis (ICD10-I70.0). Electronically Signed   By: Lowella Grip III M.D.   On: 11/07/2016 10:45    I have reviewed the patient's current medications.  Assessment/Plan: 1 ESRD For HD in am.   2 Vol xs bp low 3 CAD post CABG/Maze 4 Debill 5 DM controlled 6 Anemia esa/fe 7 HPTH vit D 8 N,V,  Colon dilated on KUB,? Meds, constip,  At high risk or ischemia P HD, esa, N,V per primary    LOS: 6 days   Luther Newhouse L 11/09/2016,10:34 AM

## 2016-11-09 NOTE — Progress Notes (Signed)
ANTICOAGULATION CONSULT NOTE - Initial Consult  Pharmacy Consult for Warfarin Indication: atrial fibrillation  No Known Allergies  Patient Measurements: Height: 5\' 8"  (172.7 cm) Weight: 226 lb (102.5 kg) IBW/kg (Calculated) : 68.4  Vital Signs: Temp: 98.2 F (36.8 C) (10/28 0537) Temp Source: Oral (10/28 0537) BP: 98/39 (10/28 0537) Pulse Rate: 76 (10/28 0537)  Labs:  Recent Labs  11/07/16 0518 11/07/16 1549 11/07/16 1555 11/08/16 0545 11/09/16 0328  HGB  --   --  8.8*  --   --   HCT  --   --  27.3*  --   --   PLT  --   --  334  --   --   LABPROT 34.0*  --   --  34.5* 32.9*  INR 3.39  --   --  3.45 3.25  CREATININE  --  6.86*  --   --   --     Estimated Creatinine Clearance: 9.3 mL/min (A) (by C-G formula based on SCr of 6.86 mg/dL (H)).   Assessment: 57 yoM transferred to CIR on 10/22 after a prolonged hospital stay that included CABG/MAZE with AKI and new ESRD. He has a h/o afib on apixaban PTA, which was switched to warfarin during the last admission. INR remains above goal at 3.25, but trending down. On amiodarone 200mg  BID. Hgb low stable, pltc WNL (last CBC 10/26). No bleeding noted.  Goal of Therapy:  INR 2-3 Monitor platelets by anticoagulation protocol: Yes   Plan:  Coumadin 0.5mg  x1 Daily INR, monitor for s/sx of bleeding  Erin N. Gerarda Fraction, PharmD PGY1 Pharmacy Resident Pager: [phone] 11/09/2016 8:24 AM

## 2016-11-09 NOTE — Progress Notes (Signed)
Occupational Therapy Session Note  Patient Details  Name: [name] MRN: [id number] Date of Birth: 10-Mar-1930  Today's Date: 11/09/2016 OT Individual Time: 4098-1191 OT Individual Time Calculation (min): 45 min    Short Term Goals: Week 1:  OT Short Term Goal 1 (Week 1): Pt will completed toilet transfer with consistent Mod A of 1 OT Short Term Goal 2 (Week 1): Pt will tolerate 1 minute standing at the sink in preparation for BADL tsak OT Short Term Goal 3 (Week 1): Pt will adhere to sternal precautions with min verbal cues during BADL tasks  Skilled Therapeutic Interventions/Progress Updates:    1:1. Focus of session in endurance, standing balance, and sit to stand. OT dons ted hose/socks and provides MOD A for transitioning supine HOB elevated>EOB. Pt able to thread LLE into pants and OT threads RLE into pants. Pt with large incontinent bowel movement in brief. Pt completes 6 sit to stand trials standing ~30-60 seconds per trial with MIN A and VC for hip extension while OT completes posterior hygiene, dons new brief and advances pants past hips. Pt completes ambulatory bed<> chair transfer with MOD A and VC for hand placement for sternal precautions. Exited session with pt lying in bed HOB elevated with call light in reach and bed exit alarm on.  Therapy Documentation Precautions:  Precautions Precautions: Fall, Sternal Restrictions Weight Bearing Restrictions: Yes RUE Weight Bearing: Weight bearing as tolerated  See Function Navigator for Current Functional Status.   Therapy/Group: Individual Therapy  Tonny Branch 11/09/2016, 7:00 AM

## 2016-11-10 ENCOUNTER — Inpatient Hospital Stay (HOSPITAL_COMMUNITY): Payer: Medicare Other

## 2016-11-10 ENCOUNTER — Inpatient Hospital Stay (HOSPITAL_COMMUNITY): Payer: Medicare Other | Admitting: Speech Pathology

## 2016-11-10 ENCOUNTER — Ambulatory Visit: Payer: Medicare Other | Admitting: Thoracic Surgery (Cardiothoracic Vascular Surgery)

## 2016-11-10 ENCOUNTER — Inpatient Hospital Stay (HOSPITAL_COMMUNITY): Payer: Medicare Other | Admitting: Occupational Therapy

## 2016-11-10 ENCOUNTER — Inpatient Hospital Stay (HOSPITAL_COMMUNITY): Payer: Medicare Other | Admitting: Physical Therapy

## 2016-11-10 DIAGNOSIS — R791 Abnormal coagulation profile: Secondary | ICD-10-CM

## 2016-11-10 DIAGNOSIS — M19021 Primary osteoarthritis, right elbow: Secondary | ICD-10-CM

## 2016-11-10 LAB — PROTIME-INR
INR: 3.85
PROTHROMBIN TIME: 37.5 s — AB (ref 11.4–15.2)

## 2016-11-10 LAB — RENAL FUNCTION PANEL
ALBUMIN: 2.2 g/dL — AB (ref 3.5–5.0)
Anion gap: 13 (ref 5–15)
BUN: 47 mg/dL — AB (ref 6–20)
CHLORIDE: 92 mmol/L — AB (ref 101–111)
CO2: 26 mmol/L (ref 22–32)
CREATININE: 7.31 mg/dL — AB (ref 0.61–1.24)
Calcium: 8.1 mg/dL — ABNORMAL LOW (ref 8.9–10.3)
GFR calc Af Amer: 7 mL/min — ABNORMAL LOW (ref >60)
GFR, EST NON AFRICAN AMERICAN: 6 mL/min — AB (ref >60)
GLUCOSE: 93 mg/dL (ref 65–99)
PHOSPHORUS: 6.5 mg/dL — AB (ref 2.5–4.6)
POTASSIUM: 4.5 mmol/L (ref 3.5–5.1)
Sodium: 131 mmol/L — ABNORMAL LOW (ref 135–145)

## 2016-11-10 LAB — CBC
HEMATOCRIT: 28.1 % — AB (ref 39.0–52.0)
Hemoglobin: 8.8 g/dL — ABNORMAL LOW (ref 13.0–17.0)
MCH: 28.7 pg (ref 26.0–34.0)
MCHC: 31.3 g/dL (ref 30.0–36.0)
MCV: 91.5 fL (ref 78.0–100.0)
PLATELETS: 318 10*3/uL (ref 150–400)
RBC: 3.07 MIL/uL — ABNORMAL LOW (ref 4.22–5.81)
RDW: 16.4 % — AB (ref 11.5–15.5)
WBC: 12 10*3/uL — AB (ref 4.0–10.5)

## 2016-11-10 LAB — GLUCOSE, CAPILLARY
GLUCOSE-CAPILLARY: 96 mg/dL (ref 65–99)
GLUCOSE-CAPILLARY: 136 mg/dL — AB (ref 65–99)
GLUCOSE-CAPILLARY: 54 mg/dL — AB (ref 65–99)
Glucose-Capillary: 115 mg/dL — ABNORMAL HIGH (ref 65–99)
Glucose-Capillary: 58 mg/dL — ABNORMAL LOW (ref 65–99)
Glucose-Capillary: 65 mg/dL (ref 65–99)

## 2016-11-10 MED ORDER — SODIUM CHLORIDE 0.9 % IV SOLN: 100.0000 mL | INTRAVENOUS | Status: DC | PRN

## 2016-11-10 MED ORDER — LIDOCAINE-PRILOCAINE 2.5-2.5 % EX CREA: 1.0000 "application " | TOPICAL_CREAM | CUTANEOUS | Status: DC | PRN

## 2016-11-10 MED ORDER — DICLOFENAC SODIUM 1 % TD GEL
2.0000 g | Freq: Four times a day (QID) | TRANSDERMAL | Status: DC
  Administered 2016-11-10 – 2016-11-20 (×40): 2 g via TOPICAL
  Filled 2016-11-10: qty 100

## 2016-11-10 MED ORDER — ALTEPLASE 2 MG IJ SOLR: 2.0000 mg | Freq: Once | INTRAMUSCULAR | Status: DC | PRN

## 2016-11-10 MED ORDER — GLUCOSE 40 % PO GEL
ORAL | Status: AC
  Administered 2016-11-10: 37.5 g
  Filled 2016-11-10: qty 1

## 2016-11-10 MED ORDER — MIDODRINE HCL 5 MG PO TABS
ORAL_TABLET | ORAL | Status: AC
  Administered 2016-11-10: 10 mg via ORAL
  Filled 2016-11-10: qty 2

## 2016-11-10 MED ORDER — PENTAFLUOROPROP-TETRAFLUOROETH EX AERO: 1.0000 "application " | INHALATION_SPRAY | CUTANEOUS | Status: DC | PRN

## 2016-11-10 MED ORDER — DIPHENHYDRAMINE HCL 25 MG PO CAPS
25.0000 mg | ORAL_CAPSULE | Freq: Once | ORAL | Status: AC
  Administered 2016-11-10: 25 mg via ORAL

## 2016-11-10 MED ORDER — LIDOCAINE HCL (PF) 1 % IJ SOLN: 5.0000 mL | INTRAMUSCULAR | Status: DC | PRN

## 2016-11-10 MED ORDER — DIPHENHYDRAMINE HCL 25 MG PO CAPS
ORAL_CAPSULE | ORAL | Status: AC
  Administered 2016-11-10: 25 mg via ORAL
  Filled 2016-11-10: qty 1

## 2016-11-10 MED ORDER — HEPARIN SODIUM (PORCINE) 1000 UNIT/ML DIALYSIS
100.0000 [IU]/kg | INTRAMUSCULAR | Status: DC | PRN
  Administered 2016-11-10: 10300 [IU] via INTRAVENOUS_CENTRAL
  Filled 2016-11-10: qty 11

## 2016-11-10 MED ORDER — HEPARIN SODIUM (PORCINE) 1000 UNIT/ML DIALYSIS: 1000.0000 [IU] | INTRAMUSCULAR | Status: DC | PRN

## 2016-11-10 NOTE — Progress Notes (Signed)
Speech Language Pathology Daily Session Note  Patient Details  Name: [name] MRN: [id number] Date of Birth: 05-10-1930  Today's Date: 11/10/2016 SLP Individual Time: 4196-2229 SLP Individual Time Calculation (min): 53 min  Short Term Goals: Week 1: SLP Short Term Goal 1 (Week 1): Pt will consume trials of ice chips with minimal overt s/s of aspiration and supervision cues for use of swallowing precautions over 3 consecutive sessions prior to repeat objective assessment.  SLP Short Term Goal 2 (Week 1): Pt will utilize external aids to facilitate recall of daily information with supervision verbal cues.   SLP Short Term Goal 3 (Week 1): Pt will complete semi-complex tasks with supervision verbal cues for functional problem solving.   SLP Short Term Goal 4 (Week 1): Pt will complete 25 repetitions of EMST with mod I and a self perceived effort level of <7/10 over 3 consecutive sessions.   SLP Short Term Goal 5 (Week 1): Pt will consume dys 3 textures and hony thick liquids with mod I use of swallowing precautions and minimal overt s/s of aspiration  Skilled Therapeutic Interventions: Skilled ST services focused on dysphagia and cognitive skills. SLP facilitated trials of ice chips following oral care, pt demonstrated 1 delayed cough and no other overt s/s aspiration with approximate 20 ice chips. Pt demonstrated minimum-mild congested cough without PO intake and during PO intake for dys 3 and HTL. Pt required supervision question cues to utilize swallow strategies and redirect to visual aid for recall. Pt demonstrated EMST x25 repetitions with a self- perceived effortful level of 5/10. Pt was left in bed with call bell within reach. Continue ST services.      Function:  Eating Eating   Modified Consistency Diet: Yes Eating Assist Level: More than reasonable amount of time;Set up assist for;Supervision or verbal cues   Eating Set Up Assist For: Cutting food        Cognition Comprehension Comprehension assist level: Follows basic conversation/direction with no assist  Expression   Expression assist level: Expresses basic needs/ideas: With no assist  Social Interaction Social Interaction assist level: Interacts appropriately with others with medication or extra time (anti-anxiety, antidepressant).  Problem Solving Problem solving assist level: Solves basic problems with no assist  Memory Memory assist level: Recognizes or recalls 75 - 89% of the time/requires cueing 10 - 24% of the time    Pain Pain Assessment Pain Assessment: 0-10 Pain Score: 8  Pain Type: Acute pain Pain Location: Elbow Pain Orientation: Right Pain Descriptors / Indicators: Aching  Therapy/Group: Individual Therapy  Krystle Oberman  [facility] 11/10/2016, 8:57 AM

## 2016-11-10 NOTE — Progress Notes (Signed)
Occupational Therapy Session Note  Patient Details  Name: [name] MRN: [id number] Date of Birth: 14-May-1930  Today's Date: 11/10/2016 OT Individual Time:  - 1100-1210  (70 min)      Short Term Goals: Week 1:  OT Short Term Goal 1 (Week 1): Pt will completed toilet transfer with consistent Mod A of 1 OT Short Term Goal 2 (Week 1): Pt will tolerate 1 minute standing at the sink in preparation for BADL tsak OT Short Term Goal 3 (Week 1): Pt will adhere to sternal precautions with min verbal cues during BADL tasks    :     Skilled Therapeutic Interventions/Progress Updates:     BP;  91/32.   Pt sitting in wc asleep upon OT arrival.  Required 5 minutes to arouse from sleep using tapping for stimulation.  Pt reported 8/10 pain in right elbow.  OT provided light therapeutic manual techniques for pain relief.Ppt reported pain decreased to minimal 3/10.  Addressed cognition, mobility, pain control, breathing exercises.  Educated pt on diaphragmatic exercises with Pt practicing return with 75 % accuracy. Educated pt on sternal precautions which he was unable to recall, but did so after education.    Did stand balance with manual cues for forward lean and wB on anterior feet.  Ambulated to sink with RW with mod assist. BP= 103/45 after walking.  Ppt stood to wash hands with increased hip flexion and cues for upright posture.  Ambulated back to EOB with mod assit and transferred to supine with min assist.  Left pt in bed with safety alarm t on and call bell,phone within reach.     Therapy Documentation Precautions:  Precautions Precautions: Fall, Sternal Restrictions Weight Bearing Restrictions: Yes RUE Weight Bearing: Weight bearing as tolerated      Pain: Pain Assessment Pain Assessment: 0-10 Pain Score: 8 ; pain decreased to 3 /10 after treatment Pain Type: Acute pain Pain Location: Elbow Pain Orientation: Right Pain Descriptors / Indicators: Aching ADL: ADL ADL Comments: Please  see functional navigator        See Function Navigator for Current Functional Status.   Therapy/Group: Individual Therapy  Lisa Roca 11/10/2016, 12:34 PM

## 2016-11-10 NOTE — Progress Notes (Signed)
ANTICOAGULATION CONSULT NOTE - Initial Consult  Pharmacy Consult for Warfarin Indication: atrial fibrillation  No Known Allergies  Patient Measurements: Height: 5\' 8"  (172.7 cm) Weight: 227 lb (103 kg) IBW/kg (Calculated) : 68.4  Vital Signs: Temp: 97.9 F (36.6 C) (10/29 0543) Temp Source: Oral (10/29 0543) BP: 94/57 (10/29 0543) Pulse Rate: 77 (10/29 0543)  Labs:  Recent Labs  11/07/16 1549 11/07/16 1555 11/08/16 0545 11/09/16 0328 11/10/16 0625  HGB  --  8.8*  --   --   --   HCT  --  27.3*  --   --   --   PLT  --  334  --   --   --   LABPROT  --   --  34.5* 32.9* 37.5*  INR  --   --  3.45 3.25 3.85  CREATININE 6.86*  --   --   --   --     Estimated Creatinine Clearance: 9.3 mL/min (A) (by C-G formula based on SCr of 6.86 mg/dL (H)).   Assessment: 29 yoM transferred to CIR on 10/22 after a prolonged hospital stay that included CABG/MAZE with AKI and new ESRD. He has a h/o afib on apixaban PTA, which was switched to warfarin during the last admission. INR remains above goal at 3.85, and trending up. Coumadin was on hold 10/25-10/26 and only 0.5 mg was given for 10/27 and 10/28. Remains on amiodarone 200mg  BID. Hgb low stable, pltc WNL (last CBC 10/26). No bleeding noted.  Goal of Therapy:  INR 2-3 Monitor platelets by anticoagulation protocol: Yes   Plan:  Hold coumadin tonight Will restart with low dose after INR < 3 Daily INR, monitor for s/sx of bleeding  Maryanna Shape, PharmD, BCPS  Clinical Pharmacist  Pager: [phone]  11/10/2016 12:49 PM

## 2016-11-10 NOTE — Progress Notes (Signed)
Speech Language Pathology Daily Session Note  Patient Details  Name: [name] MRN: [id number] Date of Birth: December 13, 1930  Today's Date: 11/10/2016 SLP Individual Time: 7482-7078 SLP Individual Time Calculation (min): 30 min  Short Term Goals: Week 1: SLP Short Term Goal 1 (Week 1): Pt will consume trials of ice chips with minimal overt s/s of aspiration and supervision cues for use of swallowing precautions over 3 consecutive sessions prior to repeat objective assessment.  SLP Short Term Goal 2 (Week 1): Pt will utilize external aids to facilitate recall of daily information with supervision verbal cues.   SLP Short Term Goal 3 (Week 1): Pt will complete semi-complex tasks with supervision verbal cues for functional problem solving.   SLP Short Term Goal 4 (Week 1): Pt will complete 25 repetitions of EMST with mod I and a self perceived effort level of <7/10 over 3 consecutive sessions.   SLP Short Term Goal 5 (Week 1): Pt will consume dys 3 textures and hony thick liquids with mod I use of swallowing precautions and minimal overt s/s of aspiration  Skilled Therapeutic Interventions: Skilled treatment session focused on dysphagia goals. SLP facilitated session by providing Min A verbal cues for accuracy with EMST exercises for 30 repetitions and a self-percieved effort level of 8/10. Patient performed oral care via the suction toothbrush and consumed trials of ice chips without overt s/s of aspiration and Min A verbal cues for use of multiple swallows. Patient left upright in bed with all needs within reach. Continue with current plan of care.      Function:   Cognition Comprehension Comprehension assist level: Follows basic conversation/direction with no assist  Expression   Expression assist level: Expresses basic needs/ideas: With no assist  Social Interaction Social Interaction assist level: Interacts appropriately with others with medication or extra time (anti-anxiety,  antidepressant).  Problem Solving Problem solving assist level: Solves basic 50 - 74% of the time/requires cueing 25 - 49% of the time  Memory Memory assist level: Recognizes or recalls 50 - 74% of the time/requires cueing 25 - 49% of the time    Pain No/Denies Pain   Therapy/Group: Individual Therapy  Mirabella Hilario 11/10/2016, 3:07 PM

## 2016-11-10 NOTE — Progress Notes (Signed)
[facility] PHYSICAL MEDICINE & REHABILITATION     PROGRESS NOTE  Subjective/Complaints:  Patient seen lying in bed this morning.  He states he slept well overnight and had a good weekend, but notes elbow and hand pain.  He states that he has had arthritis for years and received a?  Yearly infusion.  ROS: Denies nausea, vomiting, diarrhea, shortness of breath or chest pain   Objective: Vital Signs: Blood pressure (!) 94/57, pulse 77, temperature 97.9 F (36.6 C), temperature source Oral, resp. rate 16, height 5\' 8"  (1.727 m), weight 103 kg (227 lb), SpO2 95 %. Dg Abd 1 View  Result Date: 11/09/2016 CLINICAL DATA:  Nausea for multiple months. EXAM: ABDOMEN - 1 VIEW COMPARISON:  10/20/2016 FINDINGS: There is mild gaseous distension of partially redundant: Throughout the abdomen, with the degree of distension less than on the prior study. A small to moderate amount of stool is present in the colon. No dilated small bowel loops are seen to suggest obstruction. No intraperitoneal free air is identified, with sensitivity reduced by supine technique. No acute osseous abnormality is identified. IMPRESSION: Mild gaseous distension of the colon, less than on the prior study. Electronically Signed   By: Logan Bores M.D.   On: 11/09/2016 10:33    Recent Labs  11/07/16 1555  WBC 8.7  HGB 8.8*  HCT 27.3*  PLT 334    Recent Labs  11/07/16 1549  NA 134*  K 4.7  CL 95*  GLUCOSE 84  BUN 58*  CREATININE 6.86*  CALCIUM 8.1*   CBG (last 3)   Recent Labs  11/09/16 1654 11/09/16 2149 11/10/16 0634  GLUCAP 142* 170* 96    Wt Readings from Last 3 Encounters:  11/10/16 103 kg (227 lb)  11/03/16 92.6 kg (204 lb 2.3 oz)  10/06/16 99 kg (218 lb 5 oz)    Physical Exam:  BP (!) 94/57 (BP Location: Right Arm)   Pulse 77   Temp 97.9 F (36.6 C) (Oral)   Resp 16   Ht 5\' 8"  (1.727 m)   Wt 103 kg (227 lb)   SpO2 95%   BMI 34.52 kg/m  Constitutional: He appears well-developed. NAD. Frail   HENT: Normocephalic and atraumatic.  Eyes: EOM are normal. No discharge.  Cardiovascular: RRR without murmur. No JVD  . Respiratory: CTA Bilaterally without wheezes or rales.  Normal effort  GI: BS +, non-tender, non-distended   Musculoskeletal: He exhibits no edema or tenderness, including right elbow.  -Tinel's at elbow Neurological:  Alert and oriented Motor: 4-/5 throughout (unchanged) Skin: Skin is warm and dry. Intact.  Psychiatric: Normal mood and behavior.  Assessment/Plan: 1. Functional deficits secondary to debility which require 3+ hours per day of interdisciplinary therapy in a comprehensive inpatient rehab setting. Physiatrist is providing close team supervision and 24 hour management of active medical problems listed below. Physiatrist and rehab team continue to assess barriers to discharge/monitor patient progress toward functional and medical goals.  Function:  Bathing Bathing position   Position: Wheelchair/chair at sink  Bathing parts Body parts bathed by patient: Right arm, Left arm, Chest, Abdomen, Front perineal area, Right upper leg, Left upper leg, Right lower leg, Left lower leg Body parts bathed by helper: Buttocks, Back  Bathing assist Assist Level: Touching or steadying assistance(Pt > 75%)      Upper Body Dressing/Undressing Upper body dressing   What is the patient wearing?: Pull over shirt/dress     Pull over shirt/dress - Perfomed by patient: Thread/unthread  right sleeve, Thread/unthread left sleeve, Put head through opening Pull over shirt/dress - Perfomed by helper: Pull shirt over trunk        Upper body assist Assist Level: Touching or steadying assistance(Pt > 75%) (50%, moderate assist)      Lower Body Dressing/Undressing Lower body dressing   What is the patient wearing?: Pants, Non-skid slipper socks, Ted Hose     Pants- Performed by patient: Thread/unthread right pants leg, Thread/unthread left pants leg Pants- Performed by  helper: Pull pants up/down   Non-skid slipper socks- Performed by helper: Don/doff right sock, Don/doff left sock               TED Hose - Performed by helper: Don/doff right TED hose, Don/doff left TED hose  Lower body assist Assist for lower body dressing:  (helper completed all, total assist)      Toileting Toileting Toileting activity did not occur: No continent bowel/bladder event   Toileting steps completed by helper: Adjust clothing prior to toileting, Performs perineal hygiene, Adjust clothing after toileting Toileting Assistive Devices: Grab bar or rail  Toileting assist Assist level: Two helpers   Transfers Chair/bed transfer   Chair/bed transfer method: Stand pivot Chair/bed transfer assist level: Touching or steadying assistance (Pt > 75%) Chair/bed transfer assistive device: Medical sales representative Ambulation activity did not occur: Refused   Max distance: 30' Assist level: Touching or steadying assistance (Pt > 75%)   Wheelchair Wheelchair activity did not occur: Refused Type: Manual      Cognition Comprehension Comprehension assist level: Follows basic conversation/direction with no assist  Expression Expression assist level: Expresses basic needs/ideas: With no assist  Social Interaction Social Interaction assist level: Interacts appropriately with others with medication or extra time (anti-anxiety, antidepressant).  Problem Solving Problem solving assist level: Solves basic problems with no assist  Memory Memory assist level: Recognizes or recalls 75 - 89% of the time/requires cueing 10 - 24% of the time    Medical Problem List and Plan: 1.  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 Patient has sternal precautions  Cont CIR  Weekend notes reviewed 2.  DVT Prophylaxis/Anticoagulation: Chronic Coumadin therapy for history of PAF   INR supratherapeutic on 10/29 3. Pain Management: Neurontin 300 mg twice a day, Ultram  as needed 4. Mood: Provide emotional support 5. Neuropsych: This patient is capable of making decisions on his own behalf. 6. Skin/Wound Care: Routine skin checks 7. Fluids/Electrolytes/Nutrition: Routine I&Os 8.AKI/CKD stage 3-4. Follow-up renal services. Presently receiving hemodialysis Monday Wednesday Friday schedule monitoring for recovery of renal function.S/P right IJ tunneled dialysis catheter/right brachiocephalic AV fistula 75/64/3329 9. Acute on chronic anemia. Continue iron supplement  Hb 8.8 on 10/26  Cont to monitor 10. Dysphagia. Dysphagia #3 honey liquids. Monitor hydration.   Advance diet as tolerated 11. Chronic diastolic congestive heart failure. Monitor for any signs of fluid overload.  Filed Weights   11/07/16 1928 11/09/16 0537 11/10/16 0543  Weight: 100 kg (220 lb 7.4 oz) 102.5 kg (226 lb) 103 kg (227 lb)   ? Reliability  CXR reviewed, suggesting CHF  Cont supplemental  oxygen 12. PAF. Chronic Coumadin, Lopressor 12.5 mg twice a day, amiodarone 200 mg every 12 hours, Midodrine 10 mg 3 times a day  Appreciate cards recs, notes reviewed 13. Diabetes mellitus and peripheral neuropathy. Hemoglobin A1c 7.2. Check blood sugars before meals and at bedtime.  Reduced levemir to 20 u daily on 10/28 14. BPH. Proscar 5 mg daily, Flomax 0.4  mg daily 15. Hyperlipidemia. Lipitor 16. Hypothyroidism. Synthroid 17.  Hypotension   Related to HD.  Fluid management per nephro. 18. Nausea over the weekend  KUB reviewed, relatively unremarkable 19.  Osteoarthritis  Chronic problem with?  Yearly infusion  Voltaren GEL ordered  LOS (Days) 7 A FACE TO FACE EVALUATION WAS PERFORMED  Ankit Lorie Phenix 11/10/2016 8:32 AM

## 2016-11-10 NOTE — Consult Note (Signed)
[facility] Nurse wound consult note Reason for Consult:Moisture associated skin damage(MASD) incontinence- to bilateral gluteal folds.  Currently having episodes of fecal incontinence.  Partial thickness skin loss, tender to touch.  Wound type:  MASD  Fecal incontinence Pressure Injury POA: NA Measurement: 4 cm x 2 xm x 0.1 cm partial thickness skin loss on each gluteal fold.   Wound OIT:GPQD and moist Drainage (amount, consistency, odor) scant serous weeping.  Periwound:blanchable erythema Dressing procedure/placement/frequency:No disposable briefs while in bed to improve skin microclimate. Cleanse buttocks with soap and water.  Apply  Gerhardt butt paste twice daily.  Open to air.  Prompt cleansing of incontinence.  Will not follow at this time.  Please re-consult if needed.  Domenic Moras RN BSN [place] Pager [phone]

## 2016-11-10 NOTE — Progress Notes (Signed)
  [name] Progress Note   Assessment/ Plan:   1 ESRD- HD today, continue MWF schedule 2 HTN/vol- BP relatively low, has some vol overload 3 CAD post CABG/Maze 4 Debill- in CIR 5 DM controlled 6 Anemia esa/fe 7 HPTH vit D 8 N,V,  Colon dilated on KUB, improved from prior studies    Subjective:     Had therapy today, sleepy but arousable.  For HD today   Objective:   BP (!) 94/57 (BP Location: Right Arm)   Pulse 77   Temp 97.9 F (36.6 C) (Oral)   Resp 16   Ht 5\' 8"  (1.727 m)   Wt 103 kg (227 lb)   SpO2 95%   BMI 34.52 kg/m   Physical Exam: General appearance: sleeping, arousable Resp: R > L anterior crackles Chest wall: RIJ cath Cardio: regular rate and rhythm, S1, S2 normal and systolic murmur: systolic ejection 2/6, decrescendo at 2nd left intercostal space GI: mild distension, decreased bs, nontender Extremities: trace edema, AVF RUA  Labs: BMET  Recent Labs Lab 11/04/16 0541 11/05/16 0502 11/07/16 1549  NA 134* 133* 134*  K 5.8* 6.0* 4.7  CL 96* 93* 95*  CO2 24 22 24   GLUCOSE 79 112* 84  BUN 80* 98* 58*  CREATININE 6.66* 7.69* 6.86*  CALCIUM 8.2* 8.3* 8.1*  PHOS  --  6.9* 7.3*   CBC  Recent Labs Lab 11/04/16 0541 11/05/16 0502 11/07/16 1555  WBC 7.6 8.3 8.7  NEUTROABS 5.2  --   --   HGB 8.8* 8.9* 8.8*  HCT 27.8* 27.8* 27.3*  MCV 91.4 90.6 91.9  PLT 260 305 334    @IMGRELPRIORS @ Medications:    . amiodarone  200 mg Oral Q12H  . aspirin  81 mg Per Tube Daily  . atorvastatin  40 mg Oral q1800  . [START ON 11/12/2016] darbepoetin (ARANESP) injection - DIALYSIS  200 mcg Intravenous Q Wed-HD  . diclofenac sodium  2 g Topical QID  . feeding supplement (GLUCERNA SHAKE)  237 mL Oral BID BM  . finasteride  5 mg Oral QHS  . gabapentin  300 mg Oral BID  . Gerhardt's butt cream   Topical BID  . hydrocortisone cream   Topical BID  . insulin aspart  0-15 Units Subcutaneous TID WC  . insulin detemir  20 Units Subcutaneous  Daily  . levothyroxine  25 mcg Per Tube QAC breakfast  . midodrine  10 mg Oral TID WC  . multivitamin  1 tablet Oral QHS  . pantoprazole  40 mg Oral Daily  . tamsulosin  0.4 mg Oral QHS  . Warfarin - Pharmacist Dosing Inpatient   Does not apply q1800     Madelon Lips MD [facility] pgr [phone] 11/10/2016, 2:04 PM

## 2016-11-10 NOTE — Progress Notes (Signed)
Physical Therapy Session Note  Patient Details  Name: [name] MRN: [id number] Date of Birth: 1930/10/28  Today's Date: 11/10/2016 PT Individual Time: 0904-1000 PT Individual Time Calculation (min): 56 min   Short Term Goals: Week 1:  PT Short Term Goal 1 (Week 1): Pt will participate in >10 minutes of upright activity w/o change in vital signs  PT Short Term Goal 2 (Week 1): Pt will transfer bed<>chair w/ Mod A  PT Short Term Goal 3 (Week 1): Pt will perform all mobility while maintaining sternal precautions  PT Short Term Goal 4 (Week 1): Pt will perform bed mobility w/ Mod A   Skilled Therapeutic Interventions/Progress Updates:    Pt supine in bed upon PT arrival, agreeable to therapy tx and reports pain 6/10 in R arm from elbow to fingers. Pt transferred from supine to sitting EOB with mod assist to maintain sternal precautions. Pt donned shorts seated EOB with assist to loop LEs through, performed sit<>stand with mod assist to pull shorts over hips. Pt transferred from bed<>w/c with RW, stand pivot with min assist. Therapist donned ted hoes, pt transported to gym in w/c total assist. Pt ambulated x 55 ft, x 35 ft, 20 ft this session with RW and min assist. Pt performed seated therex 2 x 10 LAQ and 2 x 10 hip flexion. Pt performed standing therex with RW: 2 x 10 marches bilaterally, 2 x 10 hip abduction. SpO2 remained >95% throughout session while on 2L of O2. Pt left seated in w/c at end of session with QRB in place and needs in reach, discussed the importance of staying up on the w/c longer.   Therapy Documentation Precautions:  Precautions Precautions: Fall, Sternal Restrictions Weight Bearing Restrictions: Yes RUE Weight Bearing: Weight bearing as tolerated   See Function Navigator for Current Functional Status.   Therapy/Group: Individual Therapy  Netta Corrigan, PT, DPT 11/10/2016, 8:00 AM

## 2016-11-11 ENCOUNTER — Inpatient Hospital Stay (HOSPITAL_COMMUNITY): Payer: Medicare Other

## 2016-11-11 ENCOUNTER — Inpatient Hospital Stay (HOSPITAL_COMMUNITY): Payer: Medicare Other | Admitting: Speech Pathology

## 2016-11-11 ENCOUNTER — Inpatient Hospital Stay (HOSPITAL_COMMUNITY): Payer: Medicare Other | Admitting: Occupational Therapy

## 2016-11-11 ENCOUNTER — Inpatient Hospital Stay (HOSPITAL_COMMUNITY): Payer: Medicare Other | Admitting: Physical Therapy

## 2016-11-11 DIAGNOSIS — E162 Hypoglycemia, unspecified: Secondary | ICD-10-CM

## 2016-11-11 LAB — GLUCOSE, CAPILLARY
GLUCOSE-CAPILLARY: 133 mg/dL — AB (ref 65–99)
GLUCOSE-CAPILLARY: 104 mg/dL — AB (ref 65–99)
Glucose-Capillary: 97 mg/dL (ref 65–99)
Glucose-Capillary: 113 mg/dL — ABNORMAL HIGH (ref 65–99)

## 2016-11-11 LAB — PROTIME-INR
INR: 3.92
PROTHROMBIN TIME: 38.1 s — AB (ref 11.4–15.2)

## 2016-11-11 MED ORDER — GUAIFENESIN ER 600 MG PO TB12
600.0000 mg | ORAL_TABLET | Freq: Two times a day (BID) | ORAL | Status: DC
  Administered 2016-11-11 – 2016-11-21 (×20): 600 mg via ORAL
  Filled 2016-11-11 (×20): qty 1

## 2016-11-11 NOTE — Progress Notes (Signed)
Occupational Therapy Session Note  Patient Details  Name: [name] MRN: [id number] Date of Birth: [birth year]  Today's Date: 11/11/2016 OT Individual Time: 1049-1200 OT Individual Time Calculation (min): 71 min    Short Term Goals: Week 1:  OT Short Term Goal 1 (Week 1): Pt will completed toilet transfer with consistent Mod A of 1 OT Short Term Goal 2 (Week 1): Pt will tolerate 1 minute standing at the sink in preparation for BADL tsak OT Short Term Goal 3 (Week 1): Pt will adhere to sternal precautions with min verbal cues during BADL tasks  Skilled Therapeutic Interventions/Progress Updates:    Pt completed UB bathing and dressing to start session.  He had completed donning new brief and shorts prior to session, so did not attempt working on this.  Pt transferred to the wheelchair from the recliner stand pivot with the RW.  Min instructional cueing for hand placement and sequencing transfer.  Once in the chair he was able to complete all UB bathing and dressing with supervision.  Therapist assisted with washing his back and drying it.  He was able to remove gripper socks and therapist assisted with TEDs in order for him to wash his lower legs and feet.  Therapist re-donned TEDs and pt donned gripper socks with setup as well.  Oxygen sats remained at 93-94% on 2Ls nasal cannula and BP in sitting was 93/45.  Pt was next rolled down to the ADL apartment where he practiced simulated walk-in shower transfers with use of the RW.  Mod instructional cueing with min assist to step over the edge of the shower posteriorly and sit on the small shower seat (pt has built in seat).  Returned to room after practicing transfer with pt requesting to get back to bed.  Had pt ambulated from the doorway of the room to the bed and transfer to supine with min guard assist.  Pt left with family and call button in reach.    Therapy Documentation Precautions:  Precautions Precautions: Fall,  Sternal Restrictions Weight Bearing Restrictions: No RUE Weight Bearing: Weight bearing as tolerated Pain: Pain Assessment Pain Assessment: No/denies pain ADL: See Function Navigator for Current Functional Status.   Therapy/Group: Individual Therapy  Khaila Velarde OTR/L 11/11/2016, 12:45 PM

## 2016-11-11 NOTE — Progress Notes (Signed)
[facility]ILIT[facility]     PROGRESS NOTE  Subjective/Complaints:  Patient seen lying in bed this morning. He states he slept well overnight. He states his elbow is much better this morning.  ROS: Denies nausea, vomiting, diarrhea, shortness of breath or chest pain   Objective: Vital Signs: Blood pressure (!) 91/48, pulse 84, temperature 98.2 F (36.8 C), temperature source Oral, resp. rate 17, height 5\' 8"  (1.727 m), weight 103 kg (227 lb), SpO2 98 %. Dg Abd 1 View  Result Date: 11/09/2016 CLINICAL DATA:  Nausea for multiple months. EXAM: ABDOMEN - 1 VIEW COMPARISON:  10/20/2016 FINDINGS: There is mild gaseous distension of partially redundant: Throughout the abdomen, with the degree of distension less than on the prior study. A small to moderate amount of stool is present in the colon. No dilated small bowel loops are seen to suggest obstruction. No intraperitoneal free air is identified, with sensitivity reduced by supine technique. No acute osseous abnormality is identified. IMPRESSION: Mild gaseous distension of the colon, less than on the prior study. Electronically Signed   By: Logan Bores M.D.   On: 11/09/2016 10:33    Recent Labs  11/10/16 1630  WBC 12.0*  HGB 8.8*  HCT 28.1*  PLT 318    Recent Labs  11/10/16 1630  NA 131*  K 4.5  CL 92*  GLUCOSE 93  BUN 47*  CREATININE 7.31*  CALCIUM 8.1*   CBG (last 3)   Recent Labs  11/10/16 2327 11/10/16 2357 11/11/16 0641  GLUCAP 58* 115* 97    Wt Readings from Last 3 Encounters:  11/10/16 103 kg (227 lb)  11/03/16 92.6 kg (204 lb 2.3 oz)  10/06/16 99 kg (218 lb 5 oz)    Physical Exam:  BP (!) 91/48 (BP Location: Left Arm)   Pulse 84   Temp 98.2 F (36.8 C) (Oral)   Resp 17   Ht 5\' 8"  (1.727 m)   Wt 103 kg (227 lb)   SpO2 98%   BMI 34.52 kg/m  Constitutional: He appears well-developed. NAD. Frail  HENT: Normocephalic and atraumatic.  Eyes: EOM are normal. No discharge.   Cardiovascular: RRR. No JVD. Respiratory: CTA Bilaterally.  Normal effort  GI: BS +, non-distended   Musculoskeletal: He exhibits no edema or tenderness, including right elbow.   Neurological:  Alert and oriented Motor: 4-/5 throughout (upper extremities stronger than lower extremities) Skin: Skin is warm and dry. Intact.  Psychiatric: Normal mood and behavior.  Assessment/Plan: 1. Functional deficits secondary to debility which require 3+ hours per day of interdisciplinary therapy in a comprehensive inpatient rehab setting. Physiatrist is providing close team supervision and 24 hour management of active medical problems listed below. Physiatrist and rehab team continue to assess barriers to discharge/monitor patient progress toward functional and medical goals.  Function:  Bathing Bathing position   Position: Wheelchair/chair at sink  Bathing parts Body parts bathed by patient: Right arm, Left arm, Chest, Abdomen, Front perineal area, Right upper leg, Left upper leg, Right lower leg, Left lower leg Body parts bathed by helper: Buttocks, Back  Bathing assist Assist Level: Touching or steadying assistance(Pt > 75%)      Upper Body Dressing/Undressing Upper body dressing   What is the patient wearing?: Pull over shirt/dress     Pull over shirt/dress - Perfomed by patient: Thread/unthread right sleeve, Thread/unthread left sleeve, Put head through opening Pull over shirt/dress - Perfomed by helper: Pull shirt over trunk  Upper body assist Assist Level: Touching or steadying assistance(Pt > 75%) (50%, moderate assist)      Lower Body Dressing/Undressing Lower body dressing   What is the patient wearing?: Pants, Non-skid slipper socks, Ted Hose     Pants- Performed by patient: Thread/unthread right pants leg, Thread/unthread left pants leg Pants- Performed by helper: Pull pants up/down   Non-skid slipper socks- Performed by helper: Don/doff right sock, Don/doff left  sock               TED Hose - Performed by helper: Don/doff right TED hose, Don/doff left TED hose  Lower body assist Assist for lower body dressing:  (helper completed all, total assist)      Toileting Toileting Toileting activity did not occur: No continent bowel/bladder event   Toileting steps completed by helper: Adjust clothing prior to toileting, Performs perineal hygiene, Adjust clothing after toileting Toileting Assistive Devices: Grab bar or rail  Toileting assist Assist level: Two helpers   Transfers Chair/bed transfer   Chair/bed transfer method: Stand pivot Chair/bed transfer assist level: Touching or steadying assistance (Pt > 75%) Chair/bed transfer assistive device: Medical sales representative Ambulation activity did not occur: Refused   Max distance: 55 ft Assist level: Touching or steadying assistance (Pt > 75%)   Wheelchair Wheelchair activity did not occur: Refused Type: Manual      Cognition Comprehension Comprehension assist level: Follows basic conversation/direction with no assist  Expression Expression assist level: Expresses basic needs/ideas: With no assist  Social Interaction Social Interaction assist level: Interacts appropriately with others with medication or extra time (anti-anxiety, antidepressant).  Problem Solving Problem solving assist level: Solves basic 50 - 74% of the time/requires cueing 25 - 49% of the time  Memory Memory assist level: Recognizes or recalls 50 - 74% of the time/requires cueing 25 - 49% of the time    Medical Problem List and Plan: 1.  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 Patient has sternal precautions  Cont CIR 2.  DVT Prophylaxis/Anticoagulation: Chronic Coumadin therapy for history of PAF   INR supratherapeutic on 10/30 3. Pain Management: Neurontin 300 mg twice a day, Ultram as needed 4. Mood: Provide emotional support 5. Neuropsych: This patient is capable of making  decisions on his own behalf. 6. Skin/Wound Care: Routine skin checks 7. Fluids/Electrolytes/Nutrition: Routine I&Os 8.AKI/CKD stage 3-4. Follow-up renal services. Presently receiving hemodialysis Monday Wednesday Friday schedule monitoring for recovery of renal function.S/P right IJ tunneled dialysis catheter/right brachiocephalic AV fistula 25/42/7062 9. Acute on chronic anemia. Continue iron supplement  Hb 8.8 on 10/29  Cont to monitor 10. Dysphagia. Dysphagia #3 honey liquids. Monitor hydration.   Advance diet as tolerated 11. Chronic diastolic congestive heart failure. Monitor for any signs of fluid overload.  Filed Weights   11/07/16 1928 11/09/16 0537 11/10/16 0543  Weight: 100 kg (220 lb 7.4 oz) 102.5 kg (226 lb) 103 kg (227 lb)   ? Reliability, trending up   CXR reviewed, suggesting CHF  Cont supplemental  Oxygen  Supplemental oxygen dependent, 2 L at home. 12. PAF. Chronic Coumadin, Lopressor 12.5 mg twice a day, amiodarone 200 mg every 12 hours, Midodrine 10 mg 3 times a day  Appreciate cards recs, notes reviewed 13. Diabetes mellitus and peripheral neuropathy. Hemoglobin A1c 7.2. Check blood sugars before meals and at bedtime.  Reduced levemir to 20 u daily on 10/28  Hypoglycemia overnight, continue to monitor with recent reduction and insulin  14. BPH. Proscar 5 mg daily,  Flomax 0.4 mg daily 15. Hyperlipidemia. Lipitor 16. Hypothyroidism. Synthroid 17.  Hypotension   Related to HD.  Fluid management per nephro. 18.  Osteoarthritis  Chronic problem with?  Yearly infusion  Voltaren GEL ordered  Improved  19. Leukocytosis  WBCs 12.0 on 10/29  Continue to monitor  Afebrile   LOS (Days) 8 A FACE TO FACE EVALUATION WAS PERFORMED  Shenea Giacobbe Lorie Phenix 11/11/2016 8:25 AM

## 2016-11-11 NOTE — Progress Notes (Signed)
  [name] Progress Note   Assessment/ Plan:   1 ESRD- HD tomorrow continue MWF schedule 2 HTN/vol- BP relatively low, has some vol overload, consider midodrine 3 CAD post CABG/Maze 4 Debill- in CIR 5 DM controlled 6 Anemia esa/fe 7 HPTH vit D 8 N,V,  Colon dilated on KUB, improved from prior studies    Subjective:     Met with pt and family.  Tired after therapies- wants to know when he's going to HD so he can get something to eat before then.     Objective:   BP (!) 104/41 (BP Location: Left Arm)   Pulse 76   Temp 98.7 F (37.1 C) (Oral)   Resp 18   Ht _0  (1.727 m)   Wt 103 kg (227 lb)   SpO2 97%   BMI 34.52 kg/m   Physical Exam: General appearance: lying in bed Resp: R > L anterior crackles Chest wall: RIJ cath Cardio: regular rate and rhythm, S1, S2 normal and systolic murmur: systolic ejection 2/6, decrescendo at 2nd left intercostal space GI: mild distension, decreased bs, nontender Extremities: trace edema, AVF RUA  Labs: BMET  Recent Labs Lab 11/05/16 0502 11/07/16 1549 11/10/16 1630  NA 133* 134* 131*  K 6.0* 4.7 4.5  CL 93* 95* 92*  CO2 _1 GLUCOSE 112* 84 93  BUN 98* 58* 47*  CREATININE 7.69* 6.86* 7.31*  CALCIUM 8.3* 8.1* 8.1*  PHOS 6.9* 7.3* 6.5*   CBC  Recent Labs Lab 11/05/16 0502 11/07/16 1555 11/10/16 1630  WBC 8.3 8.7 12.0*  HGB 8.9* 8.8* 8.8*  HCT 27.8* 27.3* 28.1*  MCV 90.6 91.9 91.5  PLT 305 334 318    _2 @ Medications:    . amiodarone  200 mg Oral Q12H  . aspirin  81 mg Per Tube Daily  . atorvastatin  40 mg Oral q1800  . [START ON 11/12/2016] darbepoetin (ARANESP) injection - DIALYSIS  200 mcg Intravenous Q Wed-HD  . diclofenac sodium  2 g Topical QID  . feeding supplement (GLUCERNA SHAKE)  237 mL Oral BID BM  . finasteride  5 mg Oral QHS  . gabapentin  300 mg Oral BID  . Gerhardt's butt cream   Topical BID  . guaiFENesin  600 mg Oral BID  . hydrocortisone cream   Topical BID   . insulin aspart  0-15 Units Subcutaneous TID WC  . insulin detemir  20 Units Subcutaneous Daily  . levothyroxine  25 mcg Per Tube QAC breakfast  . midodrine  10 mg Oral TID WC  . multivitamin  1 tablet Oral QHS  . pantoprazole  40 mg Oral Daily  . tamsulosin  0.4 mg Oral QHS  . Warfarin - Pharmacist Dosing Inpatient   Does not apply q1800     Madelon Lips MD [facility] pgr [phone] 11/11/2016, 4:51 PM

## 2016-11-11 NOTE — Progress Notes (Signed)
Speech Language Pathology Daily Session Note  Patient Details  Name: [name] MRN: [id number] Date of Birth: 10-Aug-1930  Today's Date: 11/11/2016 SLP Individual Time: 1305-1400 SLP Individual Time Calculation (min): 55 min  Short Term Goals: Week 1: SLP Short Term Goal 1 (Week 1): Pt will consume trials of ice chips with minimal overt s/s of aspiration and supervision cues for use of swallowing precautions over 3 consecutive sessions prior to repeat objective assessment.  SLP Short Term Goal 2 (Week 1): Pt will utilize external aids to facilitate recall of daily information with supervision verbal cues.   SLP Short Term Goal 3 (Week 1): Pt will complete semi-complex tasks with supervision verbal cues for functional problem solving.   SLP Short Term Goal 4 (Week 1): Pt will complete 25 repetitions of EMST with mod I and a self perceived effort level of <7/10 over 3 consecutive sessions.   SLP Short Term Goal 5 (Week 1): Pt will consume dys 3 textures and hony thick liquids with mod I use of swallowing precautions and minimal overt s/s of aspiration  Skilled Therapeutic Interventions:  Pt was seen for skilled ST targeting dysphagia goals.  Pt consumed dys 3 textures and honey thick liquids with supervision verbal cues for use of swallowing precautions.  Pt continues to have congested cough but remains unclear whether this is related to PO as pt has cough both with and without intake.  Recommend proceeding with repeat MBS at next available appointment given pt's overall improved endurance clinically.  Re-assessed for RMST to progress device.  Pt's peak MIP was 40 and peak MEP was 60 cm H20.  EMST device was set at 30 cm H2O with pt able to complete 25 repetitions with mod I and a self perceived effort level of 7/10.  Pt was left in bed with family at bedside.  Continue per current plan of care.    Function:  Eating Eating   Modified Consistency Diet: Yes Eating Assist Level: More than  reasonable amount of time   Eating Set Up Assist For: Opening containers;Cutting food;Applying device (includes dentures)       Cognition Comprehension Comprehension assist level: Follows basic conversation/direction with no assist  Expression   Expression assist level: Expresses basic needs/ideas: With extra time/assistive device  Social Interaction Social Interaction assist level: Interacts appropriately with others with medication or extra time (anti-anxiety, antidepressant).  Problem Solving Problem solving assist level: Solves basic 75 - 89% of the time/requires cueing 10 - 24% of the time  Memory Memory assist level: Recognizes or recalls 75 - 89% of the time/requires cueing 10 - 24% of the time    Pain Pain Assessment Pain Assessment: No/denies pain  Therapy/Group: Individual Therapy  Brielynn Sekula, Selinda Orion 11/11/2016, 2:39 PM

## 2016-11-11 NOTE — Plan of Care (Signed)
Problem: RH Wheelchair Mobility Goal: LTG Patient will propel w/c in home environment (PT) LTG: Patient will propel wheelchair in home environment, # of feet with assistance (PT).  Outcome: Not Applicable Date Met: 03/79/55 Goal discontinued w/c goal 2/2 to sternal precautions and pt unable to reach floor w/ LEs, requires high profile w/c cushion for pressure relief.

## 2016-11-11 NOTE — Progress Notes (Signed)
Physical Therapy Weekly Progress Note  Patient Details  Name: [name] MRN: [id number] Date of Birth: [birth year]  Beginning of progress report period: November 04, 2016 End of progress report period: November 11, 2016  Today's Date: 11/11/2016 PT Individual Time: 0805-0900 PT Individual Time Calculation (min): 55 min    Patient has met 4 of 4 short term goals. Pt's tolerance to OOB and upright activity has improved over the last week, as he is consistently transferring and ambulating w/ Min A up to 50-60' w/ RW. He requires Mod A overall w/ bed mobility 2/2 sternal precautions and occasional verbal cues to maintain sternal precautions w/ all mobility. He did negotiate 4 steps, however required a seated rest break at top of stairs 2/2 fatigue. He continues to require supplemental O2 at rest and w/ mobility and frequently c/o SOB, however O2 sats >90% throughout all sessions.   Patient continues to demonstrate the following deficits muscle weakness, decreased cardiorespiratoy endurance and decreased oxygen support and decreased standing balance and difficulty maintaining precautions and therefore will continue to benefit from skilled PT intervention to increase functional independence with mobility.  Patient progressing toward long term goals..  Continue plan of care. Long term gait goals upgraded due to pt progress. Discontinued w/c goal 2/2 to sternal precautions and pt unable to reach floor w/ LEs, requires high profile w/c cushion for pressure relief.   PT Short Term Goals Week 1:  PT Short Term Goal 1 (Week 1): Pt will participate in >10 minutes of upright activity w/o change in vital signs  PT Short Term Goal 1 - Progress (Week 1): Met PT Short Term Goal 2 (Week 1): Pt will transfer bed<>chair w/ Mod A  PT Short Term Goal 2 - Progress (Week 1): Met PT Short Term Goal 3 (Week 1): Pt will perform all mobility while maintaining sternal precautions  PT Short Term Goal 3 - Progress (Week 1):  Met PT Short Term Goal 4 (Week 1): Pt will perform bed mobility w/ Mod A  PT Short Term Goal 4 - Progress (Week 1): Met Week 2:  PT Short Term Goal 1 (Week 2): =LTGs due to ELOS  Skilled Therapeutic Interventions/Progress Updates:   Pt sitting up and eating breakfast upon arrival, agreeable to therapy and no c/o pain. Worked on functional activity and endurance this session. Transferred to EOB w/ Min A using HOB to assist and Total A to don LE garments, ted hose, and socks for time management. Ambulated 50' x3 using RW w/o w/c follow. Min guard for gait 2/2 safety, no LOB. Mild increase in work of breathing that resolves w/ seated rest. Ice chips provided during seated rests and worked on breathing techniques. Decreased c/o of SOB during mobility. Returned to room and worked on sit<>stands from [employer] w/ Mod A overall w/o UE assist 2/2 sternal precautions, 5x sit<>stand. He requires Min-Mod A to remain standing w/o UE support on RW. Ended session in recliner, call bell within reach and all needs met.   Therapy Documentation Precautions:  Precautions Precautions: Fall, Sternal Restrictions Weight Bearing Restrictions: Yes RUE Weight Bearing: Weight bearing as tolerated Vital Signs:   See Function Navigator for Current Functional Status.  Therapy/Group: Individual Therapy  Elliot Simoneaux K Arnette 11/11/2016, 9:07 AM

## 2016-11-11 NOTE — Progress Notes (Signed)
ANTICOAGULATION CONSULT NOTE - Initial Consult  Pharmacy Consult for Warfarin Indication: atrial fibrillation  No Known Allergies  Patient Measurements: Height: 5\' 8"  (172.7 cm) Weight: 227 lb (103 kg) IBW/kg (Calculated) : 68.4  Vital Signs: Temp: 98.2 F (36.8 C) (10/30 0355) Temp Source: Oral (10/30 0355) BP: 91/48 (10/30 0355) Pulse Rate: 84 (10/30 0355)  Labs:  Recent Labs  11/09/16 0328 11/10/16 0625 11/10/16 1630 11/11/16 0519  HGB  --   --  8.8*  --   HCT  --   --  28.1*  --   PLT  --   --  318  --   LABPROT 32.9* 37.5*  --  38.1*  INR 3.25 3.85  --  3.92  CREATININE  --   --  7.31*  --     Estimated Creatinine Clearance: 8.7 mL/min (A) (by C-G formula based on SCr of 7.31 mg/dL (H)).   Assessment: 50 yoM transferred to CIR on 10/22 after a prolonged hospital stay that included CABG/MAZE with AKI and new ESRD. He has a h/o afib on apixaban PTA, which was switched to warfarin during the last admission. INR remains above goal at 3.92, and trending up. Coumadin was on hold 10/25-10/26 and only 0.5 mg was given for 10/27 and 10/28. Remains on amiodarone 200mg  BID. Hgb low stable, pltc WNL (last CBC 10/26). No bleeding noted.  Goal of Therapy:  INR 2-3 Monitor platelets by anticoagulation protocol: Yes   Plan:  Hold coumadin tonight Will restart with low dose after INR < 3 Daily INR, monitor for s/sx of bleeding  Maryanna Shape, PharmD, BCPS  Clinical Pharmacist  Pager: [phone]  11/11/2016 2:28 PM

## 2016-11-11 NOTE — Plan of Care (Signed)
Problem: RH Ambulation Goal: LTG Patient will ambulate in home environment (PT) LTG: Patient will ambulate in home environment, # of feet with assistance (PT).  Goal upgraded due to pt progress.   Problem: RH Stairs Goal: LTG Patient will ambulate up and down stairs w/assist (PT) LTG: Patient will ambulate up and down # of stairs with assistance (PT)  Goal upgraded due to pt progress.

## 2016-11-11 NOTE — Significant Event (Signed)
Hypoglycemic Event  CBG: 54  Treatment: 1 tube instant glucose and 15 GM carbohydrate snack  Symptoms: Shaky  Follow-up CBG: Time: CBG Result:115  Possible Reasons for Event: Inadequate meal intake  Comments/MD notified:    Dorice Lamas

## 2016-11-12 ENCOUNTER — Other Ambulatory Visit: Payer: Self-pay

## 2016-11-12 ENCOUNTER — Inpatient Hospital Stay (HOSPITAL_COMMUNITY): Payer: Medicare Other

## 2016-11-12 ENCOUNTER — Encounter (HOSPITAL_COMMUNITY): Payer: Medicare Other | Admitting: Speech Pathology

## 2016-11-12 ENCOUNTER — Inpatient Hospital Stay (HOSPITAL_COMMUNITY): Payer: Medicare Other | Admitting: Speech Pathology

## 2016-11-12 ENCOUNTER — Inpatient Hospital Stay (HOSPITAL_COMMUNITY): Payer: Medicare Other | Admitting: Occupational Therapy

## 2016-11-12 ENCOUNTER — Ambulatory Visit (HOSPITAL_COMMUNITY): Payer: Medicare Other | Admitting: Speech Pathology

## 2016-11-12 DIAGNOSIS — N186 End stage renal disease: Secondary | ICD-10-CM

## 2016-11-12 DIAGNOSIS — R791 Abnormal coagulation profile: Secondary | ICD-10-CM

## 2016-11-12 DIAGNOSIS — Z992 Dependence on renal dialysis: Secondary | ICD-10-CM

## 2016-11-12 DIAGNOSIS — Z48812 Encounter for surgical aftercare following surgery on the circulatory system: Secondary | ICD-10-CM

## 2016-11-12 LAB — RENAL FUNCTION PANEL
ALBUMIN: 2.1 g/dL — AB (ref 3.5–5.0)
Anion gap: 11 (ref 5–15)
BUN: 34 mg/dL — AB (ref 6–20)
CALCIUM: 8 mg/dL — AB (ref 8.9–10.3)
CO2: 26 mmol/L (ref 22–32)
Chloride: 94 mmol/L — ABNORMAL LOW (ref 101–111)
Creatinine, Ser: 5.87 mg/dL — ABNORMAL HIGH (ref 0.61–1.24)
GFR calc Af Amer: 9 mL/min — ABNORMAL LOW (ref >60)
GFR, EST NON AFRICAN AMERICAN: 8 mL/min — AB (ref >60)
GLUCOSE: 80 mg/dL (ref 65–99)
PHOSPHORUS: 3.6 mg/dL (ref 2.5–4.6)
Potassium: 4.2 mmol/L (ref 3.5–5.1)
SODIUM: 131 mmol/L — AB (ref 135–145)

## 2016-11-12 LAB — GLUCOSE, CAPILLARY
GLUCOSE-CAPILLARY: 77 mg/dL (ref 65–99)
GLUCOSE-CAPILLARY: 129 mg/dL — AB (ref 65–99)
GLUCOSE-CAPILLARY: 65 mg/dL (ref 65–99)
Glucose-Capillary: 65 mg/dL (ref 65–99)

## 2016-11-12 LAB — PROTIME-INR
INR: 1.97
Prothrombin Time: 22.3 seconds — ABNORMAL HIGH (ref 11.4–15.2)

## 2016-11-12 LAB — CBC
HCT: 26.6 % — ABNORMAL LOW (ref 39.0–52.0)
HEMOGLOBIN: 8.4 g/dL — AB (ref 13.0–17.0)
MCH: 29.2 pg (ref 26.0–34.0)
MCHC: 31.6 g/dL (ref 30.0–36.0)
MCV: 92.4 fL (ref 78.0–100.0)
Platelets: 257 10*3/uL (ref 150–400)
RBC: 2.88 MIL/uL — ABNORMAL LOW (ref 4.22–5.81)
RDW: 16.8 % — ABNORMAL HIGH (ref 11.5–15.5)
WBC: 13.8 10*3/uL — AB (ref 4.0–10.5)

## 2016-11-12 MED ORDER — MIDODRINE HCL 5 MG PO TABS
ORAL_TABLET | ORAL | Status: AC
  Administered 2016-11-12: 10 mg via ORAL
  Filled 2016-11-12: qty 2

## 2016-11-12 MED ORDER — WARFARIN SODIUM 2 MG PO TABS
1.0000 mg | ORAL_TABLET | Freq: Once | ORAL | Status: AC
  Administered 2016-11-13: 1 mg via ORAL
  Filled 2016-11-12 (×2): qty 0.5

## 2016-11-12 MED ORDER — RESOURCE THICKENUP CLEAR PO POWD
ORAL | Status: DC | PRN
  Filled 2016-11-12: qty 125

## 2016-11-12 MED ORDER — TRAMADOL HCL 50 MG PO TABS
ORAL_TABLET | ORAL | Status: AC
Start: 2016-11-12 — End: 2016-11-12
  Administered 2016-11-12: 50 mg via ORAL
  Filled 2016-11-12: qty 1

## 2016-11-12 MED ORDER — LIDOCAINE 5 % EX PTCH
1.0000 | MEDICATED_PATCH | CUTANEOUS | Status: DC
  Administered 2016-11-12 – 2016-11-21 (×9): 1 via TRANSDERMAL
  Filled 2016-11-12 (×9): qty 1

## 2016-11-12 MED ORDER — DARBEPOETIN ALFA 200 MCG/0.4ML IJ SOSY
PREFILLED_SYRINGE | INTRAMUSCULAR | Status: AC
  Administered 2016-11-12: 200 ug via INTRAVENOUS
  Filled 2016-11-12: qty 0.4

## 2016-11-12 NOTE — Procedures (Signed)
Patient seen and examined on Hemodialysis. QB 400 TDC UF goal 2.5L.    UF profile 4  Treatment adjusted as needed.  Madelon Lips MD [facility] 4:00 PM

## 2016-11-12 NOTE — Progress Notes (Signed)
  [name] Progress Note   Assessment/ Plan:   1 ESRD- HD today.  continue MWF schedule.  2K bath.  Trying UF profile 4 to augment fluid removal  2 Hypertension/ volume: BP relatively low, think volume is contributing to "congestion", already on midodrine 10 mg TID  3 CAD- s/p CABG/Maze.  On warfarin  4 Deconditioning and debility- in CIR  5 DM- per primary  6 Anemia- last Hgb 8.8, on Aranesp 200 mcg q Wednesday and ferrlicet 706 mcg x 8 doses, started 10/26  7 Bone/ mineral: Phso 6.5, add velphoro 500 mg TID with meals, calcitriol   8 Dispo: pending    Subjective:    Getting speech therapy today.  No complaints other than "congestion"    Objective:   BP (!) 107/49 (BP Location: Left Arm)   Pulse 84   Temp 97.8 F (36.6 C) (Oral)   Resp 18   Ht 5\' 8"  (1.727 m)   Wt 102.5 kg (226 lb)   SpO2 99%   BMI 34.36 kg/m   Physical Exam: General appearance: sitting up working with speech Resp: R > L anterior crackles Chest wall: RIJ tunneled HD cath Cardio: regular rate and rhythm, S1, S2 normal  GI: mild distension, decreased bs, nontender Extremities: trace edema, AVF RUA with +T/B, R hand well-perfused  Labs: BMET  Recent Labs Lab 11/07/16 1549 11/10/16 1630  NA 134* 131*  K 4.7 4.5  CL 95* 92*  CO2 24 26  GLUCOSE 84 93  BUN 58* 47*  CREATININE 6.86* 7.31*  CALCIUM 8.1* 8.1*  PHOS 7.3* 6.5*   CBC  Recent Labs Lab 11/07/16 1555 11/10/16 1630  WBC 8.7 12.0*  HGB 8.8* 8.8*  HCT 27.3* 28.1*  MCV 91.9 91.5  PLT 334 318    @IMGRELPRIORS @ Medications:    . amiodarone  200 mg Oral Q12H  . aspirin  81 mg Per Tube Daily  . atorvastatin  40 mg Oral q1800  . darbepoetin (ARANESP) injection - DIALYSIS  200 mcg Intravenous Q Wed-HD  . diclofenac sodium  2 g Topical QID  . feeding supplement (GLUCERNA SHAKE)  237 mL Oral BID BM  . finasteride  5 mg Oral QHS  . gabapentin  300 mg Oral BID  . Gerhardt's butt cream   Topical BID  .  guaiFENesin  600 mg Oral BID  . hydrocortisone cream   Topical BID  . insulin aspart  0-15 Units Subcutaneous TID WC  . insulin detemir  20 Units Subcutaneous Daily  . levothyroxine  25 mcg Per Tube QAC breakfast  . lidocaine  1 patch Transdermal Q24H  . midodrine  10 mg Oral TID WC  . multivitamin  1 tablet Oral QHS  . pantoprazole  40 mg Oral Daily  . tamsulosin  0.4 mg Oral QHS  . warfarin  1 mg Oral ONCE-1800  . Warfarin - Pharmacist Dosing Inpatient   Does not apply q1800     Madelon Lips MD [facility] pgr [phone] 11/12/2016, 2:47 PM

## 2016-11-12 NOTE — Progress Notes (Signed)
[facility] PHYSICAL MEDICINE & REHABILITATION     PROGRESS NOTE  Subjective/Complaints:  Patient seen lying in bed this morning. He states he slept well overnight. He continues to have elbow discomfort, but states that it is improved.  ROS: + Right elbow discomfort. Denies nausea, vomiting, diarrhea, shortness of breath or chest pain   Objective: Vital Signs: Blood pressure (!) 107/49, pulse 84, temperature 97.8 F (36.6 C), temperature source Oral, resp. rate 18, height 5\' 8"  (1.727 m), weight 102.5 kg (226 lb), SpO2 99 %. No results found.  Recent Labs  11/10/16 1630  WBC 12.0*  HGB 8.8*  HCT 28.1*  PLT 318    Recent Labs  11/10/16 1630  NA 131*  K 4.5  CL 92*  GLUCOSE 93  BUN 47*  CREATININE 7.31*  CALCIUM 8.1*   CBG (last 3)   Recent Labs  11/11/16 1634 11/11/16 2119 11/12/16 0639  GLUCAP 104* 113* 77    Wt Readings from Last 3 Encounters:  11/12/16 102.5 kg (226 lb)  11/03/16 92.6 kg (204 lb 2.3 oz)  10/06/16 99 kg (218 lb 5 oz)    Physical Exam:  BP (!) 107/49 (BP Location: Left Arm)   Pulse 84   Temp 97.8 F (36.6 C) (Oral)   Resp 18   Ht 5\' 8"  (1.727 m)   Wt 102.5 kg (226 lb)   SpO2 99%   BMI 34.36 kg/m  Constitutional: He appears well-developed. NAD. Frail  HENT: Normocephalic and atraumatic.  Eyes: EOM are normal. No discharge.  Cardiovascular: RRR. No JVD. Respiratory: Upper airway sounds.  Normal effort  GI: BS +, non-distended   Musculoskeletal: He exhibits no edema or tenderness, including right elbow.   Neurological:  Alert and oriented Motor: 4-/5 throughout (upper extremities stronger than lower extremities, stable) Skin: Skin is warm and dry. Intact.  Psychiatric: Normal mood and behavior.  Assessment/Plan: 1. Functional deficits secondary to debility which require 3+ hours per day of interdisciplinary therapy in a comprehensive inpatient rehab setting. Physiatrist is providing close team supervision and 24 hour management  of active medical problems listed below. Physiatrist and rehab team continue to assess barriers to discharge/monitor patient progress toward functional and medical goals.  Function:  Bathing Bathing position   Position: Wheelchair/chair at sink  Bathing parts Body parts bathed by patient: Right arm, Left arm, Chest, Abdomen, Left upper leg, Right lower leg, Left lower leg, Right upper leg Body parts bathed by helper: Buttocks, Back  Bathing assist Assist Level: Touching or steadying assistance(Pt > 75%)      Upper Body Dressing/Undressing Upper body dressing   What is the patient wearing?: Pull over shirt/dress     Pull over shirt/dress - Perfomed by patient: Thread/unthread right sleeve, Thread/unthread left sleeve, Put head through opening, Pull shirt over trunk Pull over shirt/dress - Perfomed by helper: Pull shirt over trunk        Upper body assist Assist Level: Supervision or verbal cues      Lower Body Dressing/Undressing Lower body dressing   What is the patient wearing?: Non-skid slipper socks, Ted Hose     Pants- Performed by patient: Thread/unthread right pants leg, Thread/unthread left pants leg Pants- Performed by helper: Pull pants up/down Non-skid slipper socks- Performed by patient: Don/doff right sock, Don/doff left sock Non-skid slipper socks- Performed by helper: Don/doff right sock, Don/doff left sock               TED Hose - Performed by helper: Don/doff  right TED hose, Don/doff left TED hose  Lower body assist Assist for lower body dressing:  (helper completed all, total assist)      Toileting Toileting Toileting activity did not occur: No continent bowel/bladder event   Toileting steps completed by helper: Adjust clothing prior to toileting, Performs perineal hygiene, Adjust clothing after toileting Toileting Assistive Devices: Grab bar or rail  Toileting assist Assist level: Two helpers   Transfers Chair/bed transfer   Chair/bed transfer  method: Ambulatory Chair/bed transfer assist level: Touching or steadying assistance (Pt > 75%) Chair/bed transfer assistive device: Armrests, Medical sales representative Ambulation activity did not occur: Refused   Max distance: 50' Assist level: Touching or steadying assistance (Pt > 75%)   Wheelchair Wheelchair activity did not occur: Refused Type: Manual      Cognition Comprehension Comprehension assist level: Follows basic conversation/direction with no assist  Expression Expression assist level: Expresses basic needs/ideas: With extra time/assistive device  Social Interaction Social Interaction assist level: Interacts appropriately with others with medication or extra time (anti-anxiety, antidepressant).  Problem Solving Problem solving assist level: Solves basic 75 - 89% of the time/requires cueing 10 - 24% of the time  Memory Memory assist level: Recognizes or recalls 75 - 89% of the time/requires cueing 10 - 24% of the time    Medical Problem List and Plan: 1.  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 Patient has sternal precautions  Cont CIR 2.  DVT Prophylaxis/Anticoagulation: Chronic Coumadin therapy for history of PAF   INR Subtherapeutic on 10/31 3. Pain Management: Neurontin 300 mg twice a day, Ultram as needed 4. Mood: Provide emotional support 5. Neuropsych: This patient is capable of making decisions on his own behalf. 6. Skin/Wound Care: Routine skin checks 7. Fluids/Electrolytes/Nutrition: Routine I&Os 8.AKI/CKD stage 3-4. Follow-up renal services. Presently receiving hemodialysis Monday Wednesday Friday schedule monitoring for recovery of renal function.S/P right IJ tunneled dialysis catheter/right brachiocephalic AV fistula 60/45/4098 9. Acute on chronic anemia. Continue iron supplement  Hb 8.8 on 10/29  Cont to monitor 10. Dysphagia. Dysphagia #3 honey liquids. Monitor hydration.   Advance diet as tolerated 11. Chronic  diastolic congestive heart failure. Monitor for any signs of fluid overload.  Filed Weights   11/09/16 0537 11/10/16 0543 11/12/16 0413  Weight: 102.5 kg (226 lb) 103 kg (227 lb) 102.5 kg (226 lb)   ? Reliability  CXR reviewed, suggesting CHF  Cont supplemental  Oxygen  Supplemental oxygen dependent, 2 L at home. 12. PAF. Chronic Coumadin, Lopressor 12.5 mg twice a day, amiodarone 200 mg every 12 hours, Midodrine 10 mg 3 times a day  Appreciate cards recs, notes reviewed 13. Diabetes mellitus and peripheral neuropathy. Hemoglobin A1c 7.2. Check blood sugars before meals and at bedtime.  Reduced levemir to 20 u daily on 10/28  Relatively controlled on 10/31 14. BPH. Proscar 5 mg daily, Flomax 0.4 mg daily 15. Hyperlipidemia. Lipitor 16. Hypothyroidism. Synthroid 17.  Hypotension   Related to HD.  Fluid management per nephro. 18.  Osteoarthritis  Chronic problem with?  Yearly infusion  Voltaren GEL ordered with improvement  Lidoderm patch also ordered on 10/31 19. Leukocytosis  WBCs 12.0 on 10/29  Continue to monitor  Afebrile   LOS (Days) 9 A FACE TO FACE EVALUATION WAS PERFORMED  Kynnedy Carreno Lorie Phenix 11/12/2016 8:11 AM

## 2016-11-12 NOTE — Progress Notes (Signed)
Modified Barium Swallow Progress Note  Patient Details  Name: [name] MRN: [id number] Date of Birth: 1931/01/07  Today's Date: 11/12/2016  Modified Barium Swallow completed.  Full report located under Chart Review in the Imaging Section.  Brief recommendations include the following:  Clinical Impression      Pt presents with improvements in swallowing function in comparison to initial MBS but continues to demonstrate a mild-moderate oropharyngeal dysphagia most remarkable for decreased laryngeal closure across all boluses.  Pt had silent aspiration of large, consecutive boluses of honey thick liquids which were mitigated with supervision cues for rate and portion control.  Pt also had high penetration into the laryngeal vestibule with nectar thick liquids which cleared with supervision cues for volitional throat clear followed by second swallow.  Penetration with thin liquids was deeper into the vestibule and did not clear as effectively with the abovementioned strategies or with trials of chin tuck posture.  Suspect respiratory deficits are primary factor influencing pt's swallowing dysfunction.  Recommend advancing pt to nectar thick liquids with full supervision for use of the following swallow precautions: upright during meals, small bites/sips, clear throat after every sip of liquids and swallow twice.  Prognosis for advancement is good with ongoing ST interventions for pharyngeal strengthening, EMST, and management of safe diet progression.        Swallow Evaluation Recommendations       SLP Diet Recommendations: Dysphagia 3 (Mech soft) solids;Nectar thick liquid   Liquid Administration via: Cup   Medication Administration: Whole meds with puree   Supervision: Patient able to self feed;Full supervision/cueing for compensatory strategies   Compensations: Slow rate;Small sips/bites;Hard cough after swallow;Multiple dry swallows after each bite/sip   Postural Changes: Seated  upright at 90 degrees            Idonia Zollinger, Elmyra Ricks L 11/12/2016,10:35 AM

## 2016-11-12 NOTE — Progress Notes (Signed)
Team Conference today. Pt resting in bed quietly. Easily aroused. Denies any pain at this time. Admitted for debility with left sided weakness and sternal incision that is well approximated with scabbing along incision line, right leg dermabond that is well approximated and dry. Macular rash is noted to back and hydrcortisone cream is applied and effective to reduce scaling as well. MASD is noted to buttock and between with gerhardt's cream applied per order and dry. RUA AVF +/+. Coarse lungs throughout with cough that he states he "doesn't get up much of anything." Suction at bedside that he has used periodically this shift. Continues on O2 @ 2L per [place] and remains without any noted respiratory distress. Honey thick liquids per diet. Safety maintained. callbell within reach. Will continue to monitor.

## 2016-11-12 NOTE — Progress Notes (Signed)
Speech Language Pathology Weekly Progress and Session Note  Patient Details  Name: [name] MRN: [id number] Date of Birth: [birth year]  Beginning of progress report period:  November 04, 2016  End of progress report period:  November 12, 2016  Today's Date: 11/12/2016 SLP Individual Time: 1305-1400 SLP Individual Time Calculation (min): 55 min  Short Term Goals: Week 1: SLP Short Term Goal 1 (Week 1): Pt will consume trials of ice chips with minimal overt s/s of aspiration and supervision cues for use of swallowing precautions over 3 consecutive sessions.   SLP Short Term Goal 1 - Progress (Week 1): Progressing toward goal SLP Short Term Goal 2 (Week 1): Pt will utilize external aids to facilitate recall of daily information with supervision verbal cues.   SLP Short Term Goal 2 - Progress (Week 1): Met SLP Short Term Goal 3 (Week 1): Pt will complete semi-complex tasks with supervision verbal cues for functional problem solving.   SLP Short Term Goal 3 - Progress (Week 1): Met SLP Short Term Goal 4 (Week 1): Pt will complete 25 repetitions of EMST with mod I and a self perceived effort level of <7/10 over 3 consecutive sessions.   SLP Short Term Goal 4 - Progress (Week 1): Progressing toward goal SLP Short Term Goal 5 (Week 1): Pt will consume dys 3 textures and hony thick liquids with mod I use of swallowing precautions and minimal overt s/s of aspiration SLP Short Term Goal 5 - Progress (Week 1): Met    New Short Term Goals: Week 2: SLP Short Term Goal 1 (Week 2): Pt will consume trials of ice chips with minimal overt s/s of aspiration and supervision cues for use of swallowing precautions over 3 consecutive sessions.   SLP Short Term Goal 2 (Week 2): Pt will utilize external aids to facilitate recall of complex information with supervision verbal cues.   SLP Short Term Goal 3 (Week 2): Pt will complete 50 repetitions of EMST with mod I and a self perceived effort level of <7/10 over 3  consecutive sessions.   SLP Short Term Goal 4 (Week 2): Pt will consume dys 3 textures and nectar thick liquids with supervision use of swallowing precautions and minimal overt s/s of aspiration SLP Short Term Goal 5 (Week 2): Pt will complete 50 effortful swallows in a session with mod I.    Weekly Progress Updates:  Pt has made slow but functional gains this reporting period and has met 3 out of 5 short term goals.  Pt's diet has been advanced to dys 3 solids and nectar thick liquids which pt is consuming with min verbal cues for use of swallowing precautions.  Swallowing function remains limited by debility and compromised respiratory status.  Pt's mentation fluctuates depending on fatigue but is overall a min assist-supervision level for mildly to semi-complex cognitive tasks due to mild memory and problem solving deficits.  As a result, pt would continue to benefit from skilled ST while inpatient in order to maximize functional independence and reduce burden of care prior to discharge.  Anticipate that pt will need 24/7 supervision at discharge in addition to [place] follow up at next level of care.  Pt and family education is ongoing.     Intensity: Minumum of 1-2 x/day, 30 to 90 minutes Frequency: 3 to 5 out of 7 days Duration/Length of Stay: 10-14 days  Treatment/Interventions: Cognitive remediation/compensation;Cueing hierarchy;Functional tasks;Dysphagia/aspiration precaution training;Internal/external aids;Patient/family education   Daily Session  Skilled Therapeutic Interventions: Pt was  seen for skilled ST targeting dysphagia goals.  Pt needed min assist verbal cues for use of swallowing precautions when consuming nectar thick liquids.  A sign was posted in pt's room to maximize carryover of precautions in between therapy sessions.  Pt completed EMST at 30 cm H2O for 25 repetitions with mod I and a self perceived effort level of <7/10.    Pt also completed 20 effortful swallows (2 sets of 10)  with min verbal cues for return demonstration.  Pt was left in bed with wife and daughter at bedside.  Goals updated on this date to reflect current progress and plan of care.     Function:   Eating Eating   Modified Consistency Diet: Yes Eating Assist Level: Supervision or verbal cues           Cognition Comprehension Comprehension assist level: Follows basic conversation/direction with no assist  Expression   Expression assist level: Expresses basic needs/ideas: With no assist  Social Interaction Social Interaction assist level: Interacts appropriately 75 - 89% of the time - Needs redirection for appropriate language or to initiate interaction.  Problem Solving Problem solving assist level: Solves basic 75 - 89% of the time/requires cueing 10 - 24% of the time  Memory Memory assist level: Recognizes or recalls 75 - 89% of the time/requires cueing 10 - 24% of the time   General    Pain Pain Assessment Pain Assessment: No/denies pain  Therapy/Group: Individual Therapy  Forbes Loll, Selinda Orion 11/12/2016, 3:57 PM

## 2016-11-12 NOTE — Progress Notes (Signed)
Occupational Therapy Weekly Progress Note  Patient Details  Name: [name] MRN: [id number] Date of Birth: 1930/10/27  Beginning of progress report period: November 04, 2016 End of progress report period: November 12, 2016  Today's Date: 11/12/2016 OT Individual Time: 9373-4287 OT Individual Time Calculation (min): 58 min    Patient has met 3 of 3 short term goals.  Pt is making steady progress with OT at this time and currently needs only min assist for most selfcare tasks.  BP has remained low at times but pt with no symptoms reported in sitting or during standing for LB selfcare tasks.  He needs min to mod demonstrational cueing for adhering to sternal precautions in order to not try and pull up on the sink or the walker with both hands, but to instead push up from the chair with one hand and the other on his knee or the walker.  Endurance is still limiting with pt only tolerating standing for one minute or less.  Increased knee and trunk flexion is noted the longer he stands as well.  Feel he needs continued CIR level OT at this time to help increase overall independence.  Will need 24 hour supervision at discharge, which family should be able to provide between his children and other members.    Patient continues to demonstrate the following deficits: muscle weakness, decreased cardiorespiratoy endurance and decreased standing balance and decreased balance strategies and therefore will continue to benefit from skilled OT intervention to enhance overall performance with BADL and Reduce care partner burden.  Patient progressing toward long term goals..  Plan of care revisions: Goal revision.  OT Short Term Goals Week 2:  OT Short Term Goal 1 (Week 2): Continue working on established supervision level goals for discharge.  Skilled Therapeutic Interventions/Progress Updates:    Pt completed bathing and dressing during session.  He was able to transfer to the wheelchair with min assist using  the RW for support.  Mod instructional cueing as pt pulled up on the walker with both UEs, even when told not to.  Once he transferred to the wheelchair had him positioned at the sink for bathing task.  He was able to complete all UB bathing with supervision, needing only min assist to donn pullover shirt secondary to not being able to place the RUE in after the left and pulling it over his head.  He needed max assist to complete washing of his bottom with mod assist for pulling brief and shorts over his hips once he placed them over his LEs in sitting.  Therapist donned TEDs and he completed donning blue gripper socks with supervision on the right and min assist on the left.  Finished session with transfer back to the bed with min assist.  Pt left with call button and phone in reach.   Therapy Documentation Precautions:  Precautions Precautions: Fall, Sternal Restrictions Weight Bearing Restrictions: No RUE Weight Bearing: Weight bearing as tolerated  Pain: Pain Assessment Pain Assessment: Faces Faces Pain Scale: Hurts a little bit Pain Type: Acute pain Pain Location: Elbow Pain Orientation: Right Pain Descriptors / Indicators: Discomfort Pain Onset: On-going Pain Intervention(s): Repositioned ADL: See Function Navigator for Current Functional Status.   Therapy/Group: Individual Therapy  Kazimierz Springborn OTR/L 11/12/2016, 12:22 PM

## 2016-11-12 NOTE — Progress Notes (Signed)
ANTICOAGULATION CONSULT NOTE - Initial Consult  Pharmacy Consult for Warfarin Indication: atrial fibrillation  No Known Allergies  Patient Measurements: Height: 5\' 8"  (172.7 cm) Weight: 226 lb (102.5 kg) IBW/kg (Calculated) : 68.4  Vital Signs: Temp: 97.8 F (36.6 C) (10/31 0413) Temp Source: Oral (10/31 0413) BP: 107/49 (10/31 0413) Pulse Rate: 84 (10/31 0413)  Labs:  Recent Labs  11/10/16 0625 11/10/16 1630 11/11/16 0519 11/12/16 0514  HGB  --  8.8*  --   --   HCT  --  28.1*  --   --   PLT  --  318  --   --   LABPROT 37.5*  --  38.1* 22.3*  INR 3.85  --  3.92 1.97  CREATININE  --  7.31*  --   --     Estimated Creatinine Clearance: 8.7 mL/min (A) (by C-G formula based on SCr of 7.31 mg/dL (H)).   Assessment: 32 yoM transferred to CIR on 10/22 after a prolonged hospital stay that included CABG/MAZE with AKI and new ESRD. He has a h/o afib on apixaban PTA, which was switched to warfarin during the last admission. INR 3.92 > 1.97. Pt appears very sensitive to coumadin dose change. Remains on amiodarone 200mg  BID. Hgb low stable, pltc WNL (last CBC 10/26). No bleeding noted.  Goal of Therapy:  INR 2-3 Monitor platelets by anticoagulation protocol: Yes   Plan:  Coumadin 1mg  po x 1 tonight (likely require 0.5 - 1 mg/day) Daily INR, monitor for s/sx of bleeding  Maryanna Shape, PharmD, BCPS  Clinical Pharmacist  Pager: [phone]  11/12/2016 9:52 AM

## 2016-11-12 NOTE — Progress Notes (Signed)
Physical Therapy Session Note  Patient Details  Name: [name] MRN: [id number] Date of Birth: 01-15-1930  Today's Date: 11/12/2016 PT Individual Time: 0803-0915 PT Individual Time Calculation (min): 72 min   Short Term Goals: Week 2:  PT Short Term Goal 1 (Week 2): =LTGs due to ELOS  Skilled Therapeutic Interventions/Progress Updates:    Pt supine in bed upon PT arrival, agreeable to therapy tx and denies pain. Pt transferred from supine to sitting EOB with min assist to maintain sternal precautions and help bring trunk forward. Pt seated EOB while therapist donned ted hoes total assist. Pt transferred from bed>w/c with min assist, stand pivot using RW. Pt transported to gym in w/c total assist. Pt ambulated x 52 ft with RW and min assist, pt reported feeling like he can't catch his breath - SpO2 97% on 2L O2. Pt performed 2 x 10 toe taps on 4 inch step using RW for UE support, working on standing tolerance and balance, SpO2 97% on 2LO2. Pt transported to dayroom in w/c total assist. Pt transferred from w/c<>nustep, stand pivot with min assist. Pt used nustep x 8 minutes on workload 2 using LEs only for cardiovascular endurance training, SpO2 maintained >98%. Pt ambulated 2 x 30 ft with RW and min assist for activity tolerance and endurance. Pt performed stand pivot car transfer with min assist and RW. Pt transferred from w/c>bed stand pivot using RW and min assist, left supine in bed with hand off to RN/transport to go down for swallow study.   Therapy Documentation Precautions:  Precautions Precautions: Fall, Sternal Restrictions Weight Bearing Restrictions: No RUE Weight Bearing: Weight bearing as tolerated   See Function Navigator for Current Functional Status.   Therapy/Group: Individual Therapy  Netta Corrigan, PT, DPT 11/12/2016, 8:01 AM

## 2016-11-12 NOTE — Patient Care Conference (Signed)
Inpatient RehabilitationTeam Conference and Plan of Care Update Date: 11/12/2016   Time: 2:10 PM    Patient Name: [name]      Medical Record Number: [id number]  Date of Birth: 29-May-1930 Sex: Male         Room/Bed: 4M04C/4M04C-01 Payor Info: Payor: MEDICARE / Plan: MEDICARE PART A AND B / Product Type: *No Product type* /    Admitting Diagnosis: CAD ESRD  Admit Date/Time:  11/03/2016  4:39 PM Admission Comments: No comment available   Primary Diagnosis:  <principal problem not specified> Principal Problem: <principal problem not specified>  Patient Active Problem List   Diagnosis Date Noted  . Leukocytosis   . Subtherapeutic international normalized ratio (INR)   . Hypoglycemia   . Supratherapeutic INR   . Primary osteoarthritis of right elbow   . Nausea   . Hemodialysis-associated hypotension   . Labile blood glucose   . Shortness of breath   . Type 2 diabetes mellitus with peripheral neuropathy (HCC)   . PAF (paroxysmal atrial fibrillation) ([place])   . Debility 11/03/2016  . Coronary artery disease involving native heart without angina pectoris   . AKI (acute kidney injury) ([place])   . Dysphagia   . Chronic diastolic heart failure ([place])   . Benign prostatic hyperplasia   . Hx of CABG   . S/P CABG (coronary artery bypass graft)   . Acute blood loss anemia   . Anemia of chronic disease   . Chronic obstructive pulmonary disease ([place])   . Cardiogenic shock ([place])   . Spontaneous pneumothorax   . Acute respiratory failure with hypercapnia ([place])   . Acute renal failure ([place])   . S/P CABG x 2 + maze procedure 10/09/2016  . S/P Maze operation for atrial fibrillation 10/09/2016  . Coronary artery disease involving native coronary artery of native heart with angina pectoris ([place]) 09/30/2016  . Fusarium infection ([place]) 01/10/2016  . Dyspnea on exertion 09/12/2015  . Sepsis due to undetermined organism ([place])   . Chronic systolic CHF (congestive heart failure) ([place])   .  Chronic kidney disease (CKD), stage IV (severe) ([place])   . Cellulitis of left lower extremity   . Pulmonary hypertension ([place])   . Paroxysmal atrial fibrillation (HCC)   . Other emphysema ([place])   . Bronchiectasis without complication ([place])   . Other specified hypothyroidism   . Fever 08/18/2014  . Sepsis ([place]) 08/18/2014  . CKD (chronic kidney disease) stage 4, GFR 15-29 ml/min (HCC) 08/18/2014  . Acute on chronic systolic heart failure ([place]) 08/18/2014  . Bronchiectasis ([place]) 08/18/2014  . Hypothyroidism 08/18/2014  . Bronchiectasis without acute exacerbation ([place]) 12/29/2013  . LVH (left ventricular hypertrophy) due to hypertensive disease 12/10/2013  . Atrial fibrillation ([place]) 12/08/2013  . CHF (congestive heart failure) ([place]) 12/07/2013  . Chronic obstructive airway disease with asthma ([place]) 12/07/2013  . Thyroid goiter 02/01/2013  . Low TSH level 05/18/2012  . Right thyroid nodule 05/18/2012  . Diabetes ([place]) 05/18/2012  . HLD (hyperlipidemia) 05/18/2012  . Rheumatoid arthritis ([place]) 05/18/2012  . Essential hypertension 05/18/2012  . GERD (gastroesophageal reflux disease) 05/18/2012  . Anemia 05/18/2012  . Knee pain 02/12/2012    Expected Discharge Date: Expected Discharge Date: 11/21/16  Team Members Present: Physician leading conference: Dr. Delice Lesch Social Worker Present: Ovidio Kin, LCSW Nurse Present: Dorien Chihuahua, RN PT Present: Michaelene Song, PT OT Present: Clyda Greener, OT SLP Present: Windell Moulding, SLP PPS Coordinator present : Daiva Nakayama, RN, CRRN  Current Status/Progress Goal Weekly Team Focus  Medical   Debility secondary to CABG/Maze procedure 10/09/2016/multi-medical. Patient has sternal precautions  Improve safety, endurance, chronic conditions  See above   Bowel/Bladder   continent of bowels; oliguric  maintain continence with current stay      Swallow/Nutrition/ Hydration   Upgraded to nectar thick liquids; full supervision   mod I    use of swallowing precautions, trials of ice chips   ADL's   supervision for UB selfcare, min assist for LB selfcare and functional transfers to the toilet, still with low BP and limited endurance  modified independent to supervision  selfcare retraining, energy conservation, transfer training, balance retraining, DME/AE education,   Mobility   Min A transfers, Mod A bed mobility, Min guard gait up to 50'  Min A transfers and bed mobility 2/2 sternal precautions, supervision gait  Endurance, decrease supplemental O2 requirement, adherence to sternal precautions, discharge planning    Communication             Safety/Cognition/ Behavioral Observations  min assist-supervision; fluctuates due to fatigue   supervision   continue to address recall of daily information   Pain   c/o elbow pain / osteoarthritis  less than 2 pain      Skin   masd - buttock;rt chest- hd cath; lt grt toe- old amputaion; chest- scabbed/ healing incision  masd to butt to heal with current treatment         *See Care Plan and progress notes for long and short-term goals.     Barriers to Discharge  Current Status/Progress Possible Resolutions Date Resolved   Physician    Medical stability;Decreased caregiver support;Hemodialysis;Lack of/limited family support;Weight;Other (comments)  OA  See above  Therapies, follow labs, monitor weights, optimize DM, optimize pain meds      Nursing                  PT  Inaccessible home environment;Home environment access/layout;Weight bearing restrictions  5 adult children very involved  Min A overall, working on endurance            OT                  SLP                SW                Discharge Planning/Teaching Needs:  Unsure if plan home versus NH, will need to know about HD and if needs to continue this as OP. Wife needs care due to her dementia      Team Discussion:  Goals supervision-min assist level. Diet upgraded to nectar thick liquids. Elbow pain MD  prescribing voltaren and pain patch now. Still congested and breathing issues on 3 l O2. HD-M-W-F need to set up as OP. Family supportive begin family education  Revisions to Treatment Plan:  DC 11/9    Continued Need for Acute Rehabilitation Level of Care: The patient requires daily medical management by a physician with specialized training in physical medicine and rehabilitation for the following conditions: Daily direction of a multidisciplinary physical rehabilitation program to ensure safe treatment while eliciting the highest outcome that is of practical value to the patient.: Yes Daily medical management of patient stability for increased activity during participation in an intensive rehabilitation regime.: Yes Daily analysis of laboratory values and/or radiology reports with any subsequent need for medication adjustment of medical intervention for : Cardiac problems;Post surgical  problems;Diabetes problems;Other  Elease Hashimoto 11/14/2016, 9:21 AM

## 2016-11-13 ENCOUNTER — Inpatient Hospital Stay (HOSPITAL_COMMUNITY): Payer: Medicare Other | Admitting: Occupational Therapy

## 2016-11-13 ENCOUNTER — Inpatient Hospital Stay (HOSPITAL_COMMUNITY): Payer: Medicare Other | Admitting: Speech Pathology

## 2016-11-13 ENCOUNTER — Inpatient Hospital Stay (HOSPITAL_COMMUNITY): Payer: Medicare Other

## 2016-11-13 ENCOUNTER — Inpatient Hospital Stay (HOSPITAL_COMMUNITY): Payer: Medicare Other | Admitting: Physical Therapy

## 2016-11-13 DIAGNOSIS — D72829 Elevated white blood cell count, unspecified: Secondary | ICD-10-CM

## 2016-11-13 DIAGNOSIS — I5033 Acute on chronic diastolic (congestive) heart failure: Secondary | ICD-10-CM

## 2016-11-13 LAB — GLUCOSE, CAPILLARY
GLUCOSE-CAPILLARY: 109 mg/dL — AB (ref 65–99)
GLUCOSE-CAPILLARY: 218 mg/dL — AB (ref 65–99)
GLUCOSE-CAPILLARY: 80 mg/dL (ref 65–99)
GLUCOSE-CAPILLARY: 97 mg/dL (ref 65–99)
Glucose-Capillary: 85 mg/dL (ref 65–99)

## 2016-11-13 LAB — PROTIME-INR
INR: 1.7
Prothrombin Time: 19.8 seconds — ABNORMAL HIGH (ref 11.4–15.2)

## 2016-11-13 MED ORDER — WARFARIN SODIUM 2 MG PO TABS
1.0000 mg | ORAL_TABLET | Freq: Once | ORAL | Status: AC
  Administered 2016-11-13: 1 mg via ORAL
  Filled 2016-11-13: qty 0.5

## 2016-11-13 MED ORDER — PREDNISONE 10 MG PO TABS
5.0000 mg | ORAL_TABLET | Freq: Every day | ORAL | Status: DC
  Administered 2016-11-13 – 2016-11-21 (×9): 5 mg via ORAL
  Filled 2016-11-13 (×9): qty 1

## 2016-11-13 MED ORDER — PREDNISOLONE 5 MG PO TABS: 5.0000 mg | ORAL_TABLET | Freq: Every day | ORAL | Status: DC

## 2016-11-13 NOTE — Progress Notes (Signed)
ANTICOAGULATION CONSULT NOTE - Initial Consult  Pharmacy Consult for Warfarin Indication: atrial fibrillation  No Known Allergies  Patient Measurements: Height: 5\' 8"  (172.7 cm) Weight: 200 lb (90.7 kg) IBW/kg (Calculated) : 68.4  Vital Signs: Temp: 97.9 F (36.6 C) (11/01 0432) Temp Source: Oral (11/01 0432) BP: 99/47 (11/01 0432) Pulse Rate: 74 (11/01 0432)  Labs:  Recent Labs  11/10/16 1630 11/11/16 0519 11/12/16 0514 11/12/16 1456 11/13/16 0425  HGB 8.8*  --   --  8.4*  --   HCT 28.1*  --   --  26.6*  --   PLT 318  --   --  257  --   LABPROT  --  38.1* 22.3*  --  19.8*  INR  --  3.92 1.97  --  1.70  CREATININE 7.31*  --   --  5.87*  --     Estimated Creatinine Clearance: 10.2 mL/min (A) (by C-G formula based on SCr of 5.87 mg/dL (H)).   Assessment: 74 yoM transferred to CIR on 10/22 after a prolonged hospital stay that included CABG/MAZE with AKI and new ESRD. He has a h/o afib on apixaban PTA, which was switched to warfarin during the last admission.   INR 1.7; 10/31 dose delayed until 11/1 AM likely d/t off floor for HD. Pt appears very sensitive to coumadin dose change. Remains on amiodarone 200mg  BID. Hgb low stable, pltc WNL (last CBC 10/31). No bleeding noted.  Goal of Therapy:  INR 2-3 Monitor platelets by anticoagulation protocol: Yes   Plan:  Coumadin 1mg  po x 1 tonight (likely require 0.5 - 1 mg/day) Daily INR, monitor for s/sx of bleeding  Maryanna Shape, PharmD, BCPS  Clinical Pharmacist  Pager: [phone]  11/13/2016 12:24 PM

## 2016-11-13 NOTE — Plan of Care (Signed)
Problem: RH BOWEL ELIMINATION Goal: RH STG MANAGE BOWEL WITH ASSISTANCE STG Manage Bowel with Mod Assistance.  Outcome: Not Progressing LBM 7/28; sorbitol given today

## 2016-11-13 NOTE — Progress Notes (Signed)
Pt returned to unit from dialysis at [facility] and they removed 1200 ml administered midodrine, iron and aranesp and was given ultram at 1800 for rue pain which was effective. However, this shift RUE continues to be painful with voltaren gel applied and ultram administered again and effective. FSBS at HS = 65. Nepro given  And increased it to 97 at 1230 recheck. callbell within reach. safety maintained. Continues on low bed and able to make needs known. RUA AVF +/+ and right chest permacath noted for use. Continues on o2 @ 2 L and is without any noted respiratory distress. Will continue to monitor.

## 2016-11-13 NOTE — Progress Notes (Signed)
Social Work Patient ID: [name], male   DOB: 09/24/1930, 83 y.o.   MRN: [id number]  Met with pt and son-Robbie to discuss team conference goals supervision-min assist level and target discharge 11/9. Pt wishes it was tomorrow aware not medically ready at this time. He just misses being at home. Discussed having children come in next week to go through therapies with him and learn his care. Both agreeable to this plan. Have contacted [facility] to make aware of discharge date and setting up OP HD. She will get back with worker when arranged. Work on discharge plans.  

## 2016-11-13 NOTE — Plan of Care (Signed)
Problem: RH BLADDER ELIMINATION Goal: RH STG MANAGE BLADDER WITH ASSISTANCE STG Manage Bladder With Mod Assistance  Outcome: Not Applicable Date Met: 42/37/02 Pt is oliguric

## 2016-11-13 NOTE — Progress Notes (Signed)
[facility] PHYSICAL MEDICINE & REHABILITATION     PROGRESS NOTE  Subjective/Complaints:  Pt seen laying in bed this AM.  Family at bedside.  He states he did not sleep well overnight due to elbow and wrist pain.  Now he states he used to receive an "Arixia" infusion monthly.  Family states pain only started after [place]uba.   ROS: + Right elbow, wrist pain. Denies nausea, vomiting, diarrhea, shortness of breath or chest pain   Objective: Vital Signs: Blood pressure (!) 99/47, pulse 74, temperature 97.9 F (36.6 C), temperature source Oral, resp. rate 18, height 5\' 8"  (1.727 m), weight 90.7 kg (200 lb), SpO2 99 %. Dg Swallowing Func-speech Pathology  Result Date: 11/12/2016 Objective Swallowing Evaluation: Type of Study: MBS-Modified Barium Swallow Study Patient Details Name: [name] MRN: [id number] Date of Birth: 03-02-1930 Today's Date: 11/12/2016 Past Medical History: Past Medical History: Diagnosis Date . Anemia  . Asthma  . Atrial fibrillation ([place]) 12/08/2013 . CHF (congestive heart failure) ([place])  . Chronic joint pain   "from RA" (10/01/2016) . CKD (chronic kidney disease), stage III ([place])  . Constipation   takes stool softener daily . COPD (chronic obstructive pulmonary disease) ([place])  . Coronary artery disease  . Coronary artery disease involving native coronary artery of native heart with angina pectoris ([place]) 09/30/2016 . Dysrhythmia   a-fib . GERD (gastroesophageal reflux disease)   takes Zantac daily  . History of blood transfusion   "related to one of my ORs" . Hyperlipidemia   takes Pravastatin daily . Hypertension   takes Cardizem,Proscar,and Lisinopril daily . Hypothyroidism  . Impaired hearing   wears hearing aids . Joint swelling   "from RA" (10/01/2016) . Peripheral neuropathy  . Pneumonia 2017 . Rheumatoid arthritis ([place])   "all over; take orencia  q 4 wks" (10/01/2016) . S/P CABG x 2 10/09/2016  LIMA to LAD, SVG to OM, EVH via right thigh . S/P Maze operation for atrial fibrillation  10/09/2016  Complete bilateral atrial lesion set using bipolar radiofrequency and cryothermy ablation with clipping of LA appendage . Type II diabetes mellitus ([place])  Past Surgical History: Past Surgical History: Procedure Laterality Date . AMPUTATION TOE Left 11/08/2015  Procedure: LEFT HALLUX AMPUTATION AT THE METATARSOPHALANGEAL JOINT;  Surgeon: Wylene Simmer, MD;  Location: [facility];  Service: Orthopedics;  Laterality: Left; . ANKLE FUSION Left 2014 . AV FISTULA PLACEMENT Right 10/28/2016  Procedure: ARTERIOVENOUS  FISTULA CREATION right arm;  Surgeon: Angelia Mould, MD;  Location: [facility];  Service: Vascular;  Laterality: Right; . BIOPSY THYROID  2014 . CARDIAC CATHETERIZATION  09/30/2016 . CARPAL TUNNEL RELEASE Bilateral 1990's . CATARACT EXTRACTION W/ INTRAOCULAR LENS  IMPLANT, BILATERAL Bilateral 1990's . CLIPPING OF ATRIAL APPENDAGE  10/09/2016  Procedure: CLIPPING OF ATRIAL APPENDAGE;  Surgeon: Rexene Alberts, MD;  Location: [facility];  Service: Open Heart Surgery;; . COLONOSCOPY   . CORONARY ARTERY BYPASS GRAFT N/A 10/09/2016  Procedure: CORONARY ARTERY BYPASS GRAFTING (CABG) x two, using left internal mammary artery and right leg greater saphenous vein harvested endoscopically;  Surgeon: Rexene Alberts, MD;  Location: [facility];  Service: Open Heart Surgery;  Laterality: N/A; . FOREIGN BODY REMOVAL Right 09/06/2013  Procedure: REMOVAL FOREIGN BODY RIGHT INDEX FINGER;  Surgeon: Daryll Brod, MD;  Location: [facility];  Service: Orthopedics;  Laterality: Right;  ANESTHESIA: IV REGIONAL FAB . INSERTION OF DIALYSIS CATHETER N/A 10/28/2016  Procedure: INSERTION OF DIALYSIS CATHETER;  Surgeon: Angelia Mould, MD;  Location[facility];  Service: Vascular;  Laterality: N/A; . JOINT REPLACEMENT   . MAZE N/A 10/09/2016  Procedure: MAZE;  Surgeon: Rexene Alberts, MD;  Location: [facility];  Service: Open Heart Surgery;  Laterality: N/A; . REPAIR TENDONS FOOT Left 1946; 1984  "farming  accident; repaired tendons both times" . RIGHT/LEFT HEART CATH AND CORONARY ANGIOGRAPHY N/A 09/30/2016  Procedure: RIGHT/LEFT HEART CATH AND CORONARY ANGIOGRAPHY;  Surgeon: Nigel Mormon, MD;  Location: [facility];  Service: Cardiovascular;  Laterality: N/A; . SHOULDER ARTHROSCOPY W/ ROTATOR CUFF REPAIR Right 1990's . TEE WITHOUT CARDIOVERSION N/A 10/09/2016  Procedure: TRANSESOPHAGEAL ECHOCARDIOGRAM (TEE);  Surgeon: Rexene Alberts, MD;  Location: [facility];  Service: Open Heart Surgery;  Laterality: N/A; . THYROIDECTOMY Right 02/01/2013  Procedure: RIGHT THYROIDECTOMY LOBECTOMY;  Surgeon: Earnstine Regal, MD;  Location: [facility];  Service: General;  Laterality: Right; . TONSILLECTOMY  1930's . TOTAL KNEE ARTHROPLASTY Right 12/16/2010  Procedure: TOTAL KNEE ARTHROPLASTY; rt Surgeon: Rudean Haskell, MD;  Location: [facility];  Service: Orthopedics;  Laterality: Right; . TOTAL SHOULDER REPLACEMENT Left 03/2010 HPI: Patient is an 83 year old male with history of hypertension, COPD, GERD, pna, chronic diastolic congestive heart failure, stage IV chronic kidney disease, hyperlipidemia, recurrent paroxysmal atrial fibrillation, bronchiectasis, asthma, rheumatoid arthritis, type 2 diabetes mellitus, and anemia. Underwent CABG 9/27. Intubated 9/27-9/28, 10/3-10/8. CXR RIGHT pigtail catheter no longer seen. No pneumothorax. Slight improvement in pulmonary edema. Started on Dys 3, honey thick liquids diet per MBS.  Repeat study ordered today to determine readiness to advance given improved presentation at bedside.   No Data Recorded Assessment / Plan / Recommendation CHL IP CLINICAL IMPRESSIONS 11/12/2016 Clinical Impression Pt presents with improvements in swallowing function in comparison to initial MBS but continues to demonstrate a mild-moderate oropharyngeal dysphagia most remarkable for decreased laryngeal closure across all boluses.  Pt had silent aspiration of large, consecutive boluses of honey thick liquids which  were mitigated with supervision cues for rate and portion control.  Pt also had high penetration into the laryngeal vestibule with nectar thick liquids which cleared with supervision cues for volitional throat clear followed by second swallow.  Penetration with thin liquids was deeper into the vestibule and did not clear as effectively with the abovementioned strategies or with trials of chin tuck posture.  Suspect respiratory deficits are primary factor influencing pt's swallowing dysfunction.  Recommend advancing pt to nectar thick liquids with full supervision for use of the following swallow precautions: upright during meals, small bites/sips, clear throat after every sip of liquids and swallow twice.  Prognosis for advancement is good with ongoing ST interventions for pharyngeal strengthening, EMST, and management of safe diet progression.   SLP Visit Diagnosis Dysphagia, oropharyngeal phase (R13.12) Attention and concentration deficit following -- Frontal lobe and executive function deficit following -- Impact on safety and function Moderate aspiration risk     CHL IP DIET RECOMMENDATION 11/12/2016 SLP Diet Recommendations Dysphagia 3 (Mech soft) solids;Nectar thick liquid Liquid Administration via Cup Medication Administration Whole meds with puree Compensations Slow rate;Small sips/bites;Hard cough after swallow;Multiple dry swallows after each bite/sip Postural Changes Seated upright at 90 degrees            CHL IP ORAL PHASE 11/12/2016 Oral Phase Impaired Oral - Pudding Teaspoon -- Oral - Pudding Cup -- Oral - Honey Teaspoon -- Oral - Honey Cup WFL Oral - Nectar Teaspoon -- Oral - Nectar Cup WFL Oral - Nectar Straw -- Oral - Thin Teaspoon -- Oral -  Thin Cup Piecemeal swallowing Oral - Thin Straw Premature spillage Oral - Puree -- Oral - Mech Soft -- Oral - Regular -- Oral - Multi-Consistency -- Oral - Pill -- Oral Phase - Comment --  CHL IP PHARYNGEAL PHASE 11/12/2016 Pharyngeal Phase Impaired Pharyngeal-  Pudding Teaspoon -- Pharyngeal -- Pharyngeal- Pudding Cup -- Pharyngeal -- Pharyngeal- Honey Teaspoon -- Pharyngeal -- Pharyngeal- Honey Cup Penetration/Aspiration during swallow;Reduced airway/laryngeal closure;Reduced epiglottic inversion;Reduced laryngeal elevation Pharyngeal Material enters airway, passes BELOW cords without attempt by patient to eject out (silent aspiration) Pharyngeal- Nectar Teaspoon -- Pharyngeal -- Pharyngeal- Nectar Cup Reduced laryngeal elevation;Reduced airway/laryngeal closure;Penetration/Aspiration during swallow Pharyngeal Material enters airway, remains ABOVE vocal cords and not ejected out Pharyngeal- Nectar Straw -- Pharyngeal -- Pharyngeal- Thin Teaspoon -- Pharyngeal -- Pharyngeal- Thin Cup Reduced airway/laryngeal closure;Reduced laryngeal elevation;Penetration/Aspiration during swallow Pharyngeal Material enters airway, remains ABOVE vocal cords and not ejected out Pharyngeal- Thin Straw Reduced laryngeal elevation;Reduced airway/laryngeal closure;Penetration/Aspiration during swallow Pharyngeal Material enters airway, CONTACTS cords and not ejected out Pharyngeal- Puree -- Pharyngeal -- Pharyngeal- Mechanical Soft -- Pharyngeal -- Pharyngeal- Regular -- Pharyngeal -- Pharyngeal- Multi-consistency -- Pharyngeal -- Pharyngeal- Pill -- Pharyngeal -- Pharyngeal Comment --  CHL IP CERVICAL ESOPHAGEAL PHASE 11/12/2016 Cervical Esophageal Phase WFL Pudding Teaspoon -- Pudding Cup -- Honey Teaspoon -- Honey Cup -- Nectar Teaspoon -- Nectar Cup -- Nectar Straw -- Thin Teaspoon -- Thin Cup -- Thin Straw -- Puree -- Mechanical Soft -- Regular -- Multi-consistency -- Pill -- Cervical Esophageal Comment -- No flowsheet data found. Page, Selinda Orion 11/12/2016, 10:24 AM               Recent Labs  11/10/16 1630 11/12/16 1456  WBC 12.0* 13.8*  HGB 8.8* 8.4*  HCT 28.1* 26.6*  PLT 318 257    Recent Labs  11/10/16 1630 11/12/16 1456  NA 131* 131*  K 4.5 4.2  CL 92* 94*  GLUCOSE  93 80  BUN 47* 34*  CREATININE 7.31* 5.87*  CALCIUM 8.1* 8.0*   CBG (last 3)   Recent Labs  11/12/16 2324 11/13/16 0034 11/13/16 0649  GLUCAP 65 97 80    Wt Readings from Last 3 Encounters:  11/13/16 90.7 kg (200 lb)  11/03/16 92.6 kg (204 lb 2.3 oz)  10/06/16 99 kg (218 lb 5 oz)    Physical Exam:  BP (!) 99/47 (BP Location: Left Arm)   Pulse 74   Temp 97.9 F (36.6 C) (Oral)   Resp 18   Ht 5\' 8"  (1.727 m)   Wt 90.7 kg (200 lb)   SpO2 99%   BMI 30.41 kg/m  Constitutional: He appears well-developed. NAD. Frail  HENT: Normocephalic and atraumatic.  Eyes: EOM are normal. No discharge.  Cardiovascular: RRR. No JVD. Respiratory: +Rales.  Normal effort  GI: BS +, non-distended   Musculoskeletal: He exhibits no edema. +TTP elbow and wrist. +Durkins.   Neurological:  Alert and oriented Motor: 4-/5 throughout (upper extremities stronger than lower extremities, stable) Skin: Skin is warm and dry. Intact.  Psychiatric: Normal mood and behavior.  Assessment/Plan: 1. Functional deficits secondary to debility which require 3+ hours per day of interdisciplinary therapy in a comprehensive inpatient rehab setting. Physiatrist is providing close team supervision and 24 hour management of active medical problems listed below. Physiatrist and rehab team continue to assess barriers to discharge/monitor patient progress toward functional and medical goals.  Function:  Bathing Bathing position   Position: Wheelchair/chair at sink  Bathing parts Body parts bathed by  patient: Right arm, Left arm, Chest, Abdomen, Left upper leg, Right lower leg, Left lower leg, Right upper leg, Front perineal area Body parts bathed by helper: Buttocks  Bathing assist Assist Level: Touching or steadying assistance(Pt > 75%)      Upper Body Dressing/Undressing Upper body dressing   What is the patient wearing?: Pull over shirt/dress     Pull over shirt/dress - Perfomed by patient: Thread/unthread  right sleeve, Put head through opening, Pull shirt over trunk Pull over shirt/dress - Perfomed by helper: Thread/unthread left sleeve        Upper body assist Assist Level: Supervision or verbal cues      Lower Body Dressing/Undressing Lower body dressing   What is the patient wearing?: Non-skid slipper socks, Ted Hose     Pants- Performed by patient: Thread/unthread right pants leg, Thread/unthread left pants leg Pants- Performed by helper: Pull pants up/down Non-skid slipper socks- Performed by patient: Don/doff right sock, Don/doff left sock Non-skid slipper socks- Performed by helper: Don/doff right sock, Don/doff left sock               TED Hose - Performed by helper: Don/doff right TED hose, Don/doff left TED hose  Lower body assist Assist for lower body dressing:  (helper completed all, total assist)      Toileting Toileting Toileting activity did not occur: No continent bowel/bladder event   Toileting steps completed by helper: Adjust clothing prior to toileting, Performs perineal hygiene, Adjust clothing after toileting Toileting Assistive Devices: Grab bar or rail  Toileting assist Assist level: Two helpers   Transfers Chair/bed transfer   Chair/bed transfer method: Stand pivot Chair/bed transfer assist level: Touching or steadying assistance (Pt > 75%) Chair/bed transfer assistive device: Armrests, Medical sales representative Ambulation activity did not occur: Refused   Max distance: 52 ft Assist level: Touching or steadying assistance (Pt > 75%)   Wheelchair Wheelchair activity did not occur: Refused Type: Manual      Cognition Comprehension Comprehension assist level: Follows complex conversation/direction with extra time/assistive device  Expression Expression assist level: Expresses complex ideas: With extra time/assistive device  Social Interaction Social Interaction assist level: Interacts appropriately with others - No medications needed.   Problem Solving Problem solving assist level: Solves basic 75 - 89% of the time/requires cueing 10 - 24% of the time  Memory Memory assist level: Recognizes or recalls 75 - 89% of the time/requires cueing 10 - 24% of the time    Medical Problem List and Plan: 1.  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 Patient has sternal precautions  Cont CIR 2.  DVT Prophylaxis/Anticoagulation: Chronic Coumadin therapy for history of PAF   INR Subtherapeutic on 11/1 3. Pain Management: Neurontin 300 mg twice a day, Ultram as needed 4. Mood: Provide emotional support 5. Neuropsych: This patient is capable of making decisions on his own behalf. 6. Skin/Wound Care: Routine skin checks 7. Fluids/Electrolytes/Nutrition: Routine I&Os 8.AKI/CKD stage 3-4. Follow-up renal services. Presently receiving hemodialysis Monday Wednesday Friday schedule monitoring for recovery of renal function.S/P right IJ tunneled dialysis catheter/right brachiocephalic AV fistula 37/10/6267 9. Acute on chronic anemia. Continue iron supplement  Hb 8.4 on 10/31  Cont to monitor 10. Dysphagia. Dysphagia #3 honey liquids. Monitor hydration.   Advance diet as tolerated 11. Chronic diastolic congestive heart failure. Monitor for any signs of fluid overload.  Filed Weights   11/12/16 1436 11/12/16 1915 11/13/16 0432  Weight: 92.9 kg (204 lb 12.9 oz) 91.4 kg (201  lb 8 oz) 90.7 kg (200 lb)   ? Reliability  CXR reviewed, suggesting CHF  Cont supplemental  Oxygen  Supplemental oxygen dependent, 2 L at home. 12. PAF. Chronic Coumadin, Lopressor 12.5 mg twice a day, amiodarone 200 mg every 12 hours, Midodrine 10 mg 3 times a day  Appreciate cards recs, notes reviewed 13. Diabetes mellitus and peripheral neuropathy. Hemoglobin A1c 7.2. Check blood sugars before meals and at bedtime.  Reduced levemir to 20 u daily on 10/28  Will maintain SSI given initiation of steroids  Relatively controlled on 1/1 14. BPH.  Proscar 5 mg daily, Flomax 0.4 mg daily 15. Hyperlipidemia. Lipitor 16. Hypothyroidism. Synthroid 17.  Hypotension   See  above  Related to HD.  Fluid management per nephro. 18.  Osteoarthritis  Chronic problem with?  Yearly infusion, now states monthly infusions.    Discussed with pharmacy home medication/dosage, likely on orencia, not due until later this month  Voltaren GEL ordered with improvement  Lidoderm patch also ordered on 10/31  Prednisone 5mg  started on 11/1 19. Leukocytosis  WBCs 13.8 on 10/31  Repeat CXR ordered  Continue to monitor  Afebrile   LOS (Days) 10 A FACE TO FACE EVALUATION WAS PERFORMED  Ankit Lorie Phenix 11/13/2016 7:57 AM

## 2016-11-13 NOTE — Progress Notes (Signed)
Physical Therapy Session Note  Patient Details  Name: [name] MRN: [id number] Date of Birth: 07-01-1930  Today's Date: 11/13/2016 PT Individual Time: 1535-1555 PT Individual Time Calculation (min): 20 min  and Today's Date: 11/13/2016 PT Missed Time: 60 Minutes Missed Time Reason: Patient fatigue;Patient unwilling to participate (Pt unwilling to participate in morning therapy session 2/2 fatigue and c/o SOB)  Short Term Goals: Week 2:  PT Short Term Goal 1 (Week 2): =LTGs due to ELOS  Skilled Therapeutic Interventions/Progress Updates:   Attempted session in morning, however pt unwilling to participate 2/2 fatigue and c/o of SOB, no signs of symptoms of desat. Resting well in bed. Refusing beside therapies as well. Returned in afternoon and pt required max encouragement to participate in upright activity. RN present and providing medication. Worked on static sitting w/o back support and breathing techniques pt can utilize while at rest. Transferred to EOB w/ Mod A and pt immediately transferred to supine w/ Min guard and unwilling to participate in upright activity further. Ended session in supine, call bell within reach and all needs met.   Therapy Documentation Precautions:  Precautions Precautions: Fall, Sternal Restrictions Weight Bearing Restrictions: No RUE Weight Bearing: Weight bearing as tolerated General: PT Amount of Missed Time (min): 55 Minutes PT Missed Treatment Reason: Patient fatigue;Patient unwilling to participate (Pt unwilling to participate in morning therapy session 2/2 fatigue and c/o SOB) Vital Signs: Therapy Vitals Temp: 98.2 F (36.8 C) Temp Source: Oral Pulse Rate: 94 Resp: 16 BP: (!) 97/38 Patient Position (if appropriate): Lying Oxygen Therapy SpO2: 94 % O2 Device: Not Delivered Pain: Pain Assessment Pain Assessment: No/denies pain  See Function Navigator for Current Functional Status.   Therapy/Group: Individual Therapy  [name] K  Arnette 11/13/2016, 4:02 PM

## 2016-11-13 NOTE — Progress Notes (Signed)
Occupational Therapy Session Note  Patient Details  Name: [name] MRN: [id number] Date of Birth: 04-27-1930  Today's Date: 11/13/2016 OT Individual Time: 8563-1497 OT Individual Time Calculation (min): 58 min    Short Term Goals: Week 2:  OT Short Term Goal 1 (Week 2): Continue working on established supervision level goals for discharge.  Skilled Therapeutic Interventions/Progress Updates:    Pt completed bathing and dressing sit to stand at the sink during session.  Mod assist for stand pivot transfer from bed to wheelchair without assistive device.  All UB bathing completed with supervision as well as LB bathing with min assist.  Pt needs increased standing attempts secondary to low endurance and only being able to tolerate standing for 1 minute or less.  He was able to complete donning pullover shirt with supervision, not getting it high enough up the RUE to finish pulling it over the shoulder on that side, requiring therapist assist.  This session he was able to donn his shorts over his feet and then pull over his hips with min assist.  Therapist assisted with donning TEDs and then he was able to donn gripper socks with setup only.  He completed combing his hair and shaving with setup from wheelchair level as well.    Therapy Documentation Precautions:  Precautions Precautions: Fall, Sternal Restrictions Weight Bearing Restrictions: No RUE Weight Bearing: Weight bearing as tolerated  Pain: Pain Assessment Pain Assessment: Faces Faces Pain Scale: Hurts a little bit Pain Type: Acute pain Pain Location: Elbow Pain Orientation: Right Pain Descriptors / Indicators: Aching;Discomfort Pain Onset: On-going Pain Intervention(s): Repositioned;Emotional support Multiple Pain Sites: No ADL:  See Function Navigator for Current Functional Status.   Therapy/Group: Individual Therapy  Zaveon Gillen OTR/L 11/13/2016, 12:17 PM

## 2016-11-13 NOTE — Progress Notes (Signed)
  [facility] Progress Note   Assessment/ Plan:   1 ESRD- continue MWF schedule.  2K bath.  Trying UF profile 4 to augment fluid removal  2 Hypertension/ volume: BP relatively low, think volume is contributing to "congestion", already on midodrine 10 mg TID.  CXR today with some improvement in vascular congestion, L base atalectasis.  3 CAD- s/p CABG/Maze.  On warfarin  4 Deconditioning and debility- in CIR  5 DM- per primary  6 Anemia- last Hgb 8.8, on Aranesp 200 mcg q Wednesday and ferrlicet 456 mcg x 8 doses, started 10/26  7 Bone/ mineral: Phso 6.5, add velphoro 500 mg TID with meals, calcitriol   8 Dispo: pending    Subjective:    NAD.  HD yesterday.  Maxed on midodrine.   Objective:   BP (!) 99/47 (BP Location: Left Arm)   Pulse 74   Temp 97.9 F (36.6 C) (Oral)   Resp 18   Ht 5\' 8"  (1.727 m)   Wt 90.7 kg (200 lb)   SpO2 99%   BMI 30.41 kg/m   Physical Exam: General appearance: NAD Resp: R > L anterior crackles Chest wall: RIJ tunneled HD cath Cardio: regular rate and rhythm, S1, S2 normal  GI: mild distension, decreased bs, nontender Extremities: trace edema, AVF RUA with +T/B, R hand well-perfused.  Peeling skin bilateral hands.  Labs: BMET  Recent Labs Lab 11/07/16 1549 11/10/16 1630 11/12/16 1456  NA 134* 131* 131*  K 4.7 4.5 4.2  CL 95* 92* 94*  CO2 24 26 26   GLUCOSE 84 93 80  BUN 58* 47* 34*  CREATININE 6.86* 7.31* 5.87*  CALCIUM 8.1* 8.1* 8.0*  PHOS 7.3* 6.5* 3.6   CBC  Recent Labs Lab 11/07/16 1555 11/10/16 1630 11/12/16 1456  WBC 8.7 12.0* 13.8*  HGB 8.8* 8.8* 8.4*  HCT 27.3* 28.1* 26.6*  MCV 91.9 91.5 92.4  PLT 334 318 257    @IMGRELPRIORS @ Medications:    . amiodarone  200 mg Oral Q12H  . aspirin  81 mg Per Tube Daily  . atorvastatin  40 mg Oral q1800  . darbepoetin (ARANESP) injection - DIALYSIS  200 mcg Intravenous Q Wed-HD  . diclofenac sodium  2 g Topical QID  . feeding supplement (GLUCERNA  SHAKE)  237 mL Oral BID BM  . finasteride  5 mg Oral QHS  . gabapentin  300 mg Oral BID  . Gerhardt's butt cream   Topical BID  . guaiFENesin  600 mg Oral BID  . hydrocortisone cream   Topical BID  . insulin aspart  0-15 Units Subcutaneous TID WC  . insulin detemir  20 Units Subcutaneous Daily  . levothyroxine  25 mcg Per Tube QAC breakfast  . lidocaine  1 patch Transdermal Q24H  . midodrine  10 mg Oral TID WC  . multivitamin  1 tablet Oral QHS  . pantoprazole  40 mg Oral Daily  . predniSONE  5 mg Oral Q breakfast  . tamsulosin  0.4 mg Oral QHS  . warfarin  1 mg Oral ONCE-1800  . Warfarin - Pharmacist Dosing Inpatient   Does not apply q1800     Madelon Lips MD [facility] pgr [phone] 11/13/2016, 1:09 PM

## 2016-11-13 NOTE — Progress Notes (Signed)
Occupational Therapy Session Note  Patient Details  Name: [name] MRN: [id number] Date of Birth: August 05, 1930  Today's Date: 11/13/2016 OT Individual Time: 6381-7711 OT Individual Time Calculation (min): 26 min    Short Term Goals: Week 2:  OT Short Term Goal 1 (Week 2): Continue working on established supervision level goals for discharge.  Skilled Therapeutic Interventions/Progress Updates:    Pt transferred from supine to sit with min assist and HOB elevated to approximately 30 degrees.  He then transferred to the wheelchair with min assist using the RW.  Pt continues to need mod instructional cueing for hand placement during transfers as he wants to pull up with both hands on the walker instead of positioning one on the arm of the chair and the other on the walker.  Worked on sit to stand transitions from the wheelchair and standing balance.  Min assist needed to complete sit to stand X 4 with correct technique to adhere to sternal precautions.  Standing duration up to 1 min and 30 seconds but most attempts were less than 45 seconds.  Completed session with pt still up in wheelchair in preparation for SLP session next.    Therapy Documentation Precautions:  Precautions Precautions: Fall, Sternal Restrictions Weight Bearing Restrictions: No RUE Weight Bearing: Weight bearing as tolerated  Pain: Pain Assessment Pain Assessment: No/denies pain ADL:  See Function Navigator for Current Functional Status.   Therapy/Group: Individual Therapy  Launa Goedken OTR/L 11/13/2016, 4:04 PM

## 2016-11-13 NOTE — Progress Notes (Signed)
Speech Language Pathology Daily Session Note  Patient Details  Name: [name] MRN: [id number] Date of Birth: 02/16/1930  Today's Date: 11/13/2016 SLP Individual Time: 1505-1530 SLP Individual Time Calculation (min): 25 min  Short Term Goals: Week 2: SLP Short Term Goal 1 (Week 2): Pt will consume trials of ice chips with minimal overt s/s of aspiration and supervision cues for use of swallowing precautions over 3 consecutive sessions.   SLP Short Term Goal 2 (Week 2): Pt will utilize external aids to facilitate recall of complex information with supervision verbal cues.   SLP Short Term Goal 3 (Week 2): Pt will complete 50 repetitions of EMST with mod I and a self perceived effort level of <7/10 over 3 consecutive sessions.   SLP Short Term Goal 4 (Week 2): Pt will consume dys 3 textures and nectar thick liquids with supervision use of swallowing precautions and minimal overt s/s of aspiration SLP Short Term Goal 5 (Week 2): Pt will complete 50 effortful swallows in a session with mod I.    Skilled Therapeutic Interventions:  Pt was seen for skilled ST targeting dysphagia goals.  Pt completed 40 effortful swallows both with and without ice chips with mod I.  He also completed 30 repetitions of EMST with a self perceived effort level of 8 out of 10 resulting from increased number of repetitions.  Pt continues to have wet congested coughing both with and without PO intake.  Most recent chest x ray findings reviewed, aeration and edema slightly improved.  Pt was left in wheelchair at the end of today's therapy session with PT present.  Continue per current plan of care.    Function:  Eating Eating   Modified Consistency Diet: Yes Eating Assist Level: Supervision or verbal cues   Eating Set Up Assist For: Opening containers;Cutting food;Applying device (includes dentures)       Cognition Comprehension Comprehension assist level: Follows complex conversation/direction with extra  time/assistive device  Expression   Expression assist level: Expresses complex ideas: With extra time/assistive device  Social Interaction Social Interaction assist level: Interacts appropriately with others with medication or extra time (anti-anxiety, antidepressant).  Problem Solving Problem solving assist level: Solves basic 90% of the time/requires cueing < 10% of the time  Memory Memory assist level: Recognizes or recalls 90% of the time/requires cueing < 10% of the time    Pain Pain Assessment Pain Assessment: No/denies pain  Therapy/Group: Individual Therapy  Camilia Caywood, Selinda Orion 11/13/2016, 3:50 PM

## 2016-11-14 ENCOUNTER — Inpatient Hospital Stay (HOSPITAL_COMMUNITY): Payer: Medicare Other | Admitting: Speech Pathology

## 2016-11-14 ENCOUNTER — Inpatient Hospital Stay (HOSPITAL_COMMUNITY): Payer: Medicare Other | Admitting: Physical Therapy

## 2016-11-14 ENCOUNTER — Inpatient Hospital Stay (HOSPITAL_COMMUNITY): Payer: Medicare Other | Admitting: Occupational Therapy

## 2016-11-14 DIAGNOSIS — M069 Rheumatoid arthritis, unspecified: Secondary | ICD-10-CM

## 2016-11-14 LAB — RENAL FUNCTION PANEL
Albumin: 2.2 g/dL — ABNORMAL LOW (ref 3.5–5.0)
Anion gap: 10 (ref 5–15)
BUN: 40 mg/dL — ABNORMAL HIGH (ref 6–20)
CHLORIDE: 95 mmol/L — AB (ref 101–111)
CO2: 24 mmol/L (ref 22–32)
CREATININE: 5.47 mg/dL — AB (ref 0.61–1.24)
Calcium: 8 mg/dL — ABNORMAL LOW (ref 8.9–10.3)
GFR calc non Af Amer: 9 mL/min — ABNORMAL LOW (ref >60)
GFR, EST AFRICAN AMERICAN: 10 mL/min — AB (ref >60)
Glucose, Bld: 201 mg/dL — ABNORMAL HIGH (ref 65–99)
Phosphorus: 2.9 mg/dL (ref 2.5–4.6)
Potassium: 4.4 mmol/L (ref 3.5–5.1)
Sodium: 129 mmol/L — ABNORMAL LOW (ref 135–145)

## 2016-11-14 LAB — CBC
HCT: 27.5 % — ABNORMAL LOW (ref 39.0–52.0)
Hemoglobin: 8.6 g/dL — ABNORMAL LOW (ref 13.0–17.0)
MCH: 29.1 pg (ref 26.0–34.0)
MCHC: 31.3 g/dL (ref 30.0–36.0)
MCV: 92.9 fL (ref 78.0–100.0)
PLATELETS: 189 10*3/uL (ref 150–400)
RBC: 2.96 MIL/uL — AB (ref 4.22–5.81)
RDW: 17.7 % — ABNORMAL HIGH (ref 11.5–15.5)
WBC: 11.9 10*3/uL — AB (ref 4.0–10.5)

## 2016-11-14 LAB — GLUCOSE, CAPILLARY
GLUCOSE-CAPILLARY: 76 mg/dL (ref 65–99)
GLUCOSE-CAPILLARY: 166 mg/dL — AB (ref 65–99)
GLUCOSE-CAPILLARY: 226 mg/dL — AB (ref 65–99)

## 2016-11-14 LAB — PROTIME-INR
INR: 1.63
Prothrombin Time: 19.2 seconds — ABNORMAL HIGH (ref 11.4–15.2)

## 2016-11-14 MED ORDER — ACETAMINOPHEN 325 MG PO TABS
ORAL_TABLET | ORAL | Status: AC
  Filled 2016-11-14: qty 2

## 2016-11-14 MED ORDER — WARFARIN SODIUM 2 MG PO TABS
1.0000 mg | ORAL_TABLET | Freq: Once | ORAL | Status: AC
  Administered 2016-11-14: 1 mg via ORAL
  Filled 2016-11-14: qty 0.5

## 2016-11-14 MED ORDER — DARBEPOETIN ALFA 200 MCG/0.4ML IJ SOSY
200.0000 ug | PREFILLED_SYRINGE | INTRAMUSCULAR | Status: DC
  Administered 2016-11-20: 200 ug via INTRAVENOUS
  Filled 2016-11-14: qty 0.4

## 2016-11-14 NOTE — Progress Notes (Signed)
Physical Therapy Session Note  Patient Details  Name: [name] MRN: [id number] Date of Birth: May 03, 1930  Today's Date: 11/14/2016 PT Individual Time: 0915-1000 AND 1600-1640 PT Individual Time Calculation (min): 45 min AND 40 min  Short Term Goals: Week 2:  PT Short Term Goal 1 (Week 2): =LTGs due to ELOS  Skilled Therapeutic Interventions/Progress Updates:   Session 1:  Pt in w/c upon arrival and agreeable to therapy, no c/o pain. Worked on endurance this session. Total A w/c transport to day room and performed NuStep 10 min @ L2 for cardiorespiratory endurance, LEs only 2/2 sternal precautions. Returned to room and transferred to EOB w/ Min A and ended session in supine, call bell within reach and all needs met.   Session 2:  Pt supine upon arrival and agreeable to therapy, no c/o pain. Worked on endurance w/ functional activity this session. Transferred to EOB w/ Min A and performed multiple sit<>stand transfers while changing into warmer clothes for dialysis. Mod A for dressing. Ambulated 5' x2 w/ Min guard using RW. No supplemental O2 this session, O2 sat 99% before ambulation, 92% afterwards on room air. Moderate increase in work of breathing that resolves w/ rest. Returned to room and transferred from EOB to supine w/ Mod A. Ended session in supine, call bell within reach and all needs met.   Therapy Documentation Precautions:  Precautions Precautions: Fall, Sternal Restrictions Weight Bearing Restrictions: No RUE Weight Bearing: Weight bearing as tolerated Pain: Pain Assessment Pain Assessment: No/denies pain  See Function Navigator for Current Functional Status.   Therapy/Group: Individual Therapy  Millie Shorb K Arnette 11/14/2016, 12:38 PM

## 2016-11-14 NOTE — Progress Notes (Signed)
ANTICOAGULATION CONSULT NOTE -  Follow up Pharmacy Consult for Warfarin Indication: atrial fibrillation  No Known Allergies  Patient Measurements: Height: 5\' 8"  (172.7 cm) Weight: 225 lb (102.1 kg) IBW/kg (Calculated) : 68.4  Vital Signs: Temp: 94.5 F (34.7 C) (11/02 0525) Temp Source: Oral (11/02 0525) BP: 98/49 (11/02 0525) Pulse Rate: 72 (11/02 0525)  Labs:  Recent Labs  11/12/16 0514 11/12/16 1456 11/13/16 0425 11/14/16 0510  HGB  --  8.4*  --   --   HCT  --  26.6*  --   --   PLT  --  257  --   --   LABPROT 22.3*  --  19.8* 19.2*  INR 1.97  --  1.70 1.63  CREATININE  --  5.87*  --   --     Estimated Creatinine Clearance: 10.9 mL/min (A) (by C-G formula based on SCr of 5.87 mg/dL (H)).   Assessment: 75 yoM transferred to CIR on 10/22 after a prolonged hospital stay that included CABG/MAZE with AKI and new ESRD. He has a h/o afib on apixaban PTA, which was switched to warfarin on 10/10/16 during the last admission.   INR 1.63 today.   Pt appears very sensitive to coumadin dose changes. I anticipate that INR should start to increase toward goal soon in response to coumadin 1mg  doses x2 since 10/31pm. Remains on amiodarone 200mg  BID which is likely increasing the coumadin effect. Hgb low stable, pltc WNL (last CBC 10/31). No bleeding noted.  Goal of Therapy:  INR 2-3 Monitor platelets by anticoagulation protocol: Yes   Plan:  Coumadin 1mg  po x 1 tonight (likely require 0.5 - 1 mg/day) Daily INR, monitor for s/sx of bleeding  Nicole Cella, RPh Clinical Pharmacist Pager: [phone] phone [phone] or [phone] Main pharmacy [phone] 11/14/2016 1:40 PM

## 2016-11-14 NOTE — Progress Notes (Signed)
Accepted at [facility] 1st treatment is Tuesday 11/18/16 at 5:50 am .Schedule Tuesday,Thursday,Saturday 1st shift .Chairtime 5:50 am

## 2016-11-14 NOTE — Progress Notes (Signed)
Speech Language Pathology Daily Session Note  Patient Details  Name: [name] MRN: [id number] Date of Birth: 1930/10/23  Today's Date: 11/14/2016 SLP Individual Time: 1120-1200 SLP Individual Time Calculation (min): 40 min  Short Term Goals: Week 2: SLP Short Term Goal 1 (Week 2): Pt will consume trials of ice chips with minimal overt s/s of aspiration and supervision cues for use of swallowing precautions over 3 consecutive sessions.   SLP Short Term Goal 2 (Week 2): Pt will utilize external aids to facilitate recall of complex information with supervision verbal cues.   SLP Short Term Goal 3 (Week 2): Pt will complete 50 repetitions of EMST with mod I and a self perceived effort level of <7/10 over 3 consecutive sessions.   SLP Short Term Goal 4 (Week 2): Pt will consume dys 3 textures and nectar thick liquids with supervision use of swallowing precautions and minimal overt s/s of aspiration SLP Short Term Goal 5 (Week 2): Pt will complete 50 effortful swallows in a session with mod I.    Skilled Therapeutic Interventions:  Pt was seen for skilled ST targeting dysphagia goals.  When consuming his lunch meal, pt initially needed min verbal cues to utilize his swallowing precautions; however, as session progressed therapist was able to fade cues to supervision.  Pt demonstrated no overt s/s of aspiration with nectar thick liquids of dys 3 textures and he demonstrated subjectively improved congestion and was tolerating being on room air while at rest.  Pt completed 30 repetitions of EMST with mod I use of device and a self perceived effort level of 5 out of 10.  Pt was also able to complete 30 effortful swallows with mod I.  Pt was given homework to complete EMST and effortful swallows over the weekend which he reported he would be able to do.  Pt was left in bed with call bell within reach.  Continue per current plan of care.     Function:  Eating Eating   Modified Consistency Diet:  Yes Eating Assist Level: Supervision or verbal cues   Eating Set Up Assist For: Opening containers;Cutting food;Applying device (includes dentures)       Cognition Comprehension Comprehension assist level: Follows complex conversation/direction with no assist  Expression   Expression assist level: Expresses complex ideas: With extra time/assistive device  Social Interaction Social Interaction assist level: Interacts appropriately with others - No medications needed.  Problem Solving Problem solving assist level: Solves basic 90% of the time/requires cueing < 10% of the time  Memory Memory assist level: Recognizes or recalls 90% of the time/requires cueing < 10% of the time    Pain Pain Assessment Pain Assessment: No/denies pain  Therapy/Group: Individual Therapy  Ishitha Roper, Selinda Orion 11/14/2016, 12:28 PM

## 2016-11-14 NOTE — Progress Notes (Addendum)
Pt resting in bed quietly. Easily aroused. C/o arm pain and pain management administered and effective both topical and oral. Continues to cough up thick yellow sputum which caused him to have emesis x 1 this morning. Hob elevated slightly. Safety maintained. callbell within reach. Refused anything for emesis this morning. During familial visit pt is also noted to when awaken from sleeping, to reach toward the air and attempt pull something from the air which could only be seen by him. Family concerned. When fully awaken pt didn't even remember the dream state that he was in and could not recall any events witnessed by staff / family. Will continue to monitor.

## 2016-11-14 NOTE — Progress Notes (Signed)
Social Work Patient ID: [name], male   DOB: 1930/05/24, 83 y.o.   MRN: [id number]  Eloise Harman to let her know pt's discharge date of 11/9. He is on M,W,F while in the hospital will need to get him on T,TH and Sat due to as an OP this is the schedule he will have.

## 2016-11-14 NOTE — Procedures (Signed)
Patient seen and examined on Hemodialysis. QB 400 mL/ min UF goal 3L if tolerated.  Doing well so far.    Treatment adjusted as needed.  Madelon Lips MD [facility] pgr [phone] 8:20 PM

## 2016-11-14 NOTE — Progress Notes (Signed)
Social Work Mariko Nowakowski, Eliezer Champagne Social Worker Signed Physical Medicine and Rehabilitation  Patient Care Conference Date of Service: 11/12/2016  3:22 PM      Hide copied text Hover for attribution information Inpatient RehabilitationTeam Conference and Plan of Care Update Date: 11/12/2016   Time: 2:10 PM      Patient Name: [name]      Medical Record Number: [id number]  Date of Birth: 1930/01/31 Sex: Male         Room/Bed: 4M04C/4M04C-01 Payor Info: Payor: MEDICARE / Plan: MEDICARE PART A AND B / Product Type: *No Product type* /     Admitting Diagnosis: CAD ESRD  Admit Date/Time:  11/03/2016  4:39 PM Admission Comments: No comment available    Primary Diagnosis:  <principal problem not specified> Principal Problem: <principal problem not specified>       Patient Active Problem List    Diagnosis Date Noted  . Leukocytosis    . Subtherapeutic international normalized ratio (INR)    . Hypoglycemia    . Supratherapeutic INR    . Primary osteoarthritis of right elbow    . Nausea    . Hemodialysis-associated hypotension    . Labile blood glucose    . Shortness of breath    . Type 2 diabetes mellitus with peripheral neuropathy (HCC)    . PAF (paroxysmal atrial fibrillation) ([place])    . Debility 11/03/2016  . Coronary artery disease involving native heart without angina pectoris    . AKI (acute kidney injury) ([place])    . Dysphagia    . Chronic diastolic heart failure ([place])    . Benign prostatic hyperplasia    . Hx of CABG    . S/P CABG (coronary artery bypass graft)    . Acute blood loss anemia    . Anemia of chronic disease    . Chronic obstructive pulmonary disease ([place])    . Cardiogenic shock ([place])    . Spontaneous pneumothorax    . Acute respiratory failure with hypercapnia ([place])    . Acute renal failure ([place])    . S/P CABG x 2 + maze procedure 10/09/2016  . S/P Maze operation for atrial fibrillation 10/09/2016  . Coronary artery disease involving native  coronary artery of native heart with angina pectoris ([place]) 09/30/2016  . Fusarium infection ([place]) 01/10/2016  . Dyspnea on exertion 09/12/2015  . Sepsis due to undetermined organism ([place])    . Chronic systolic CHF (congestive heart failure) ([place])    . Chronic kidney disease (CKD), stage IV (severe) ([place])    . Cellulitis of left lower extremity    . Pulmonary hypertension ([place])    . Paroxysmal atrial fibrillation (HCC)    . Other emphysema ([place])    . Bronchiectasis without complication ([place])    . Other specified hypothyroidism    . Fever 08/18/2014  . Sepsis ([place]) 08/18/2014  . CKD (chronic kidney disease) stage 4, GFR 15-29 ml/min (HCC) 08/18/2014  . Acute on chronic systolic heart failure ([place]) 08/18/2014  . Bronchiectasis ([place]) 08/18/2014  . Hypothyroidism 08/18/2014  . Bronchiectasis without acute exacerbation ([place]) 12/29/2013  . LVH (left ventricular hypertrophy) due to hypertensive disease 12/10/2013  . Atrial fibrillation ([place]) 12/08/2013  . CHF (congestive heart failure) ([place]) 12/07/2013  . Chronic obstructive airway disease with asthma ([place]) 12/07/2013  . Thyroid goiter 02/01/2013  . Low TSH level 05/18/2012  . Right thyroid nodule 05/18/2012  . Diabetes ([place]) 05/18/2012  . HLD (hyperlipidemia) 05/18/2012  . Rheumatoid  arthritis ([place]) 05/18/2012  . Essential hypertension 05/18/2012  . GERD (gastroesophageal reflux disease) 05/18/2012  . Anemia 05/18/2012  . Knee pain 02/12/2012      Expected Discharge Date: Expected Discharge Date: 11/21/16   Team Members Present: Physician leading conference: Dr. Delice Lesch Social Worker Present: Ovidio Kin, LCSW Nurse Present: Dorien Chihuahua, RN PT Present: Michaelene Song, PT OT Present: Clyda Greener, OT SLP Present: Windell Moulding, SLP PPS Coordinator present : Daiva Nakayama, RN, CRRN       Current Status/Progress Goal Weekly Team Focus  Medical     Debility secondary to CABG/Maze procedure 10/09/2016/multi-medical. Patient  has sternal precautions  Improve safety, endurance, chronic conditions  See above   Bowel/Bladder     continent of bowels; oliguric  maintain continence with current stay      Swallow/Nutrition/ Hydration     Upgraded to nectar thick liquids; full supervision   mod I   use of swallowing precautions, trials of ice chips   ADL's     supervision for UB selfcare, min assist for LB selfcare and functional transfers to the toilet, still with low BP and limited endurance  modified independent to supervision  selfcare retraining, energy conservation, transfer training, balance retraining, DME/AE education,   Mobility     Min A transfers, Mod A bed mobility, Min guard gait up to 35'  Min A transfers and bed mobility 2/2 sternal precautions, supervision gait  Endurance, decrease supplemental O2 requirement, adherence to sternal precautions, discharge planning    Communication               Safety/Cognition/ Behavioral Observations   min assist-supervision; fluctuates due to fatigue   supervision   continue to address recall of daily information   Pain     c/o elbow pain / osteoarthritis  less than 2 pain      Skin     masd - buttock;rt chest- hd cath; lt grt toe- old amputaion; chest- scabbed/ healing incision  masd to butt to heal with current treatment        *See Care Plan and progress notes for long and short-term goals.      Barriers to Discharge   Current Status/Progress Possible Resolutions Date Resolved   Physician     Medical stability;Decreased caregiver support;Hemodialysis;Lack of/limited family support;Weight;Other (comments)  OA  See above  Therapies, follow labs, monitor weights, optimize DM, optimize pain meds      Nursing                 PT  Inaccessible home environment;Home environment access/layout;Weight bearing restrictions  5 adult children very involved  Min A overall, working on endurance            OT                 SLP            SW              Discharge  Planning/Teaching Needs:  Unsure if plan home versus NH, will need to know about HD and if needs to continue this as OP. Wife needs care due to her dementia      Team Discussion:  Goals supervision-min assist level. Diet upgraded to nectar thick liquids. Elbow pain MD prescribing voltaren and pain patch now. Still congested and breathing issues on 3 l O2. HD-M-W-F need to set up as OP. Family supportive begin family education  Revisions to Treatment Plan:  DC  11/9    Continued Need for Acute Rehabilitation Level of Care: The patient requires daily medical management by a physician with specialized training in physical medicine and rehabilitation for the following conditions: Daily direction of a multidisciplinary physical rehabilitation program to ensure safe treatment while eliciting the highest outcome that is of practical value to the patient.: Yes Daily medical management of patient stability for increased activity during participation in an intensive rehabilitation regime.: Yes Daily analysis of laboratory values and/or radiology reports with any subsequent need for medication adjustment of medical intervention for : Cardiac problems;Post surgical problems;Diabetes problems;Other   Elease Hashimoto 11/14/2016, 9:21 AM          Anabell Swint, Gardiner Rhyme, LCSW Social Worker Signed Physical Medicine and Rehabilitation  Patient Care Conference Date of Service: 11/05/2016  3:07 PM      Hide copied text Hover for attribution information Inpatient RehabilitationTeam Conference and Plan of Care Update Date: 11/05/2016   Time: 2:25 PM      Patient Name: [name]      Medical Record Number: [id number]  Date of Birth: 1930/08/15 Sex: Male         Room/Bed: 4M04C/4M04C-01 Payor Info: Payor: MEDICARE / Plan: MEDICARE PART A AND B / Product Type: *No Product type* /     Admitting Diagnosis: CAD ESRD  Admit Date/Time:  11/03/2016  4:39 PM Admission Comments: No comment available    Primary  Diagnosis:  <principal problem not specified> Principal Problem: <principal problem not specified>       Patient Active Problem List    Diagnosis Date Noted  . Shortness of breath    . Type 2 diabetes mellitus with peripheral neuropathy (HCC)    . PAF (paroxysmal atrial fibrillation) ([place])    . Debility 11/03/2016  . Coronary artery disease involving native heart without angina pectoris    . AKI (acute kidney injury) ([place])    . Dysphagia    . Chronic diastolic heart failure ([place])    . Benign prostatic hyperplasia    . Hx of CABG    . S/P CABG (coronary artery bypass graft)    . Acute blood loss anemia    . Anemia of chronic disease    . Chronic obstructive pulmonary disease ([place])    . Cardiogenic shock ([place])    . Spontaneous pneumothorax    . Acute respiratory failure with hypercapnia ([place])    . Acute renal failure ([place])    . S/P CABG x 2 + maze procedure 10/09/2016  . S/P Maze operation for atrial fibrillation 10/09/2016  . Coronary artery disease involving native coronary artery of native heart with angina pectoris ([place]) 09/30/2016  . Fusarium infection ([place]) 01/10/2016  . Dyspnea on exertion 09/12/2015  . Sepsis due to undetermined organism ([place])    . Chronic systolic CHF (congestive heart failure) ([place])    . Chronic kidney disease (CKD), stage IV (severe) ([place])    . Cellulitis of left lower extremity    . Pulmonary hypertension ([place])    . Paroxysmal atrial fibrillation (HCC)    . Other emphysema ([place])    . Bronchiectasis without complication ([place])    . Other specified hypothyroidism    . Fever 08/18/2014  . Sepsis ([place]) 08/18/2014  . CKD (chronic kidney disease) stage 4, GFR 15-29 ml/min (HCC) 08/18/2014  . Acute on chronic systolic heart failure ([place]) 08/18/2014  . Bronchiectasis ([place]) 08/18/2014  . Hypothyroidism 08/18/2014  . Bronchiectasis without acute exacerbation ([place]) 12/29/2013  .  LVH (left ventricular hypertrophy) due to hypertensive disease 12/10/2013  .  Atrial fibrillation ([place]) 12/08/2013  . CHF (congestive heart failure) ([place]) 12/07/2013  . Chronic obstructive airway disease with asthma ([place]) 12/07/2013  . Thyroid goiter 02/01/2013  . Low TSH level 05/18/2012  . Right thyroid nodule 05/18/2012  . Diabetes ([place]) 05/18/2012  . HLD (hyperlipidemia) 05/18/2012  . Rheumatoid arthritis ([place]) 05/18/2012  . Essential hypertension 05/18/2012  . GERD (gastroesophageal reflux disease) 05/18/2012  . Anemia 05/18/2012  . Knee pain 02/12/2012      Expected Discharge Date:     Team Members Present: Physician leading conference: Dr. Delice Lesch Social Worker Present: Ovidio Kin, LCSW Nurse Present: Arelia Sneddon, RN PT Present: Michaelene Song, PT OT Present: Clyda Greener, OT SLP Present: Windell Moulding, SLP PPS Coordinator present : Daiva Nakayama, RN, CRRN       Current Status/Progress Goal Weekly Team Focus  Medical     Debility secondary to CABG/Maze procedure 10/09/2016/multi-medical. Patient has sternal precautions  Improve safety, endurance, dysphagia  See above   Bowel/Bladder     (P) Continent of bowel and bladder, LBM 11-04-16 olguric  (P) remain continent of bowel and bladder  (P) Assisting with tolieting needs prn   Swallow/Nutrition/ Hydration     Dys 3, honey thick liquids; full supervision   mod I   trials of ice chips following oral care    ADL's        modified independent to supervision      Mobility     Max A transfers, Mod-Max A bed mobility, refused gait and w/c mobility at eval   Min A overall   Transfers, tolerance to OOB activity, adherence to sternal precautions, endurance, initiate gait    Communication               Safety/Cognition/ Behavioral Observations   min-mod assist  supervision   recall of daily information   Pain     (P) n         Skin                 *See Care Plan and progress notes for long and short-term goals.      Barriers to Discharge   Current Status/Progress Possible Resolutions  Date Resolved   Physician     Medical stability;Decreased caregiver support;Hemodialysis;Lack of/limited family support     See above  Therapies, follow labs, monitor weights, optimize DM      Nursing                 PT  Inaccessible home environment;Home environment access/layout;Weight bearing restrictions  WB restrictions - sternal precautions, 5 adult children very involved, lives w/ wife who has memory issues               OT                 SLP            SW Decreased caregiver support Wife has memory issues and needs supervision herself             Discharge Planning/Teaching Needs:    Home with family providing care if possible. 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 May need short term NHP     Team Discussion:  Goals min-supervision level. Multiple medical issues being addressed. HD last night early this am, did not sleep well due to this. MD watching anemia and diabetes. Pain  in sacrum 10-10 reported by RN. MD aware. May change to 15/7 due to endurance and fatigue issues. Wait to set target DC until next week.  Revisions to Treatment Plan:  Set DC next week    Continued Need for Acute Rehabilitation Level of Care: The patient requires daily medical management by a physician with specialized training in physical medicine and rehabilitation for the following conditions: Daily direction of a multidisciplinary physical rehabilitation program to ensure safe treatment while eliciting the highest outcome that is of practical value to the patient.: Yes Daily medical management of patient stability for increased activity during participation in an intensive rehabilitation regime.: Yes Daily analysis of laboratory values and/or radiology reports with any subsequent need for medication adjustment of medical intervention for : Cardiac problems;Post surgical problems;Diabetes problems;Other   Elease Hashimoto 11/05/2016, 3:07 PM           Patient ID:  Irena Cords, male   DOB: 27-Sep-1930, 83 y.o.   MRN: [id number]

## 2016-11-14 NOTE — Progress Notes (Signed)
  [facility] Progress Note   Assessment/ Plan:   1 ESRD- 2K bath.  Trying UF profile 4 to augment fluid removal, 35 C temp.  Getting extra rx tomorrow to get on a TTS schedule for discharge and for fluid (lungs are fairly wet).  2 Hypertension/ volume: BP relatively low, think volume is contributing to "congestion", already on midodrine 10 mg TID.    3 CAD- s/p CABG/Maze.  On warfarin  4 Deconditioning and debility- in CIR  5 DM- per primary  6 Anemia- last Hgb 8.8, on Aranesp 200 mcg q Wednesday and ferrlicet 297 mcg x 8 doses, started 10/26  7 Bone/ mineral: Phso 6.5, add velphoro 500 mg TID with meals, calcitriol   8 Dispo: 11/9 is d/c date    Subjective:    NAD, tolerating therapies   Objective:   BP (!) 125/47   Pulse (!) 7   Temp 97.7 F (36.5 C) (Oral)   Resp 18   Ht 5\' 8"  (1.727 m)   Wt 104.3 kg (230 lb)   SpO2 95%   BMI 34.97 kg/m   Physical Exam: General appearance: NAD Resp: R > L crackles Chest wall: RIJ tunneled HD cath Cardio: regular rate and rhythm, S1, S2 normal  GI: mild distension, decreased bs, nontender Extremities: 1+ edema, AVF RUA with +T/B, R hand well-perfused.  Peeling skin bilateral hands.  Labs: BMET  Recent Labs Lab 11/10/16 1630 11/12/16 1456 11/14/16 1842  NA 131* 131* 129*  K 4.5 4.2 4.4  CL 92* 94* 95*  CO2 26 26 24   GLUCOSE 93 80 201*  BUN 47* 34* 40*  CREATININE 7.31* 5.87* 5.47*  CALCIUM 8.1* 8.0* 8.0*  PHOS 6.5* 3.6 2.9   CBC  Recent Labs Lab 11/10/16 1630 11/12/16 1456 11/14/16 1842  WBC 12.0* 13.8* 11.9*  HGB 8.8* 8.4* 8.6*  HCT 28.1* 26.6* 27.5*  MCV 91.5 92.4 92.9  PLT 318 257 189    @IMGRELPRIORS @ Medications:    . amiodarone  200 mg Oral Q12H  . aspirin  81 mg Per Tube Daily  . atorvastatin  40 mg Oral q1800  . [START ON 11/20/2016] darbepoetin (ARANESP) injection - DIALYSIS  200 mcg Intravenous Q Thu-HD  . diclofenac sodium  2 g Topical QID  . feeding supplement (GLUCERNA  SHAKE)  237 mL Oral BID BM  . finasteride  5 mg Oral QHS  . gabapentin  300 mg Oral BID  . Gerhardt's butt cream   Topical BID  . guaiFENesin  600 mg Oral BID  . hydrocortisone cream   Topical BID  . insulin aspart  0-15 Units Subcutaneous TID WC  . insulin detemir  20 Units Subcutaneous Daily  . levothyroxine  25 mcg Per Tube QAC breakfast  . lidocaine  1 patch Transdermal Q24H  . midodrine  10 mg Oral TID WC  . multivitamin  1 tablet Oral QHS  . pantoprazole  40 mg Oral Daily  . predniSONE  5 mg Oral Q breakfast  . tamsulosin  0.4 mg Oral QHS  . Warfarin - Pharmacist Dosing Inpatient   Does not apply q1800     Madelon Lips MD [facility] pgr [phone] 11/14/2016, 8:13 PM

## 2016-11-14 NOTE — Progress Notes (Addendum)
[place] PHYSICAL MEDICINE & REHABILITATION     PROGRESS NOTE  Subjective/Complaints:  Patient seen lying in bed this morning. He states he slept better overnight. He states he feels better with steroids.  ROS: Denies nausea, vomiting, diarrhea, shortness of breath or chest pain   Objective: Vital Signs: Blood pressure (!) 98/49, pulse 72, temperature (!) 94.5 F (34.7 C), temperature source Oral, resp. rate 18, height 5\' 8"  (1.727 m), weight 102.1 kg (225 lb), SpO2 96 %. Dg Chest 2 View  Result Date: 11/13/2016 CLINICAL DATA:  Leukocytosis with productive cough. EXAM: CHEST  2 VIEW COMPARISON:  11/07/2016. FINDINGS: Enlarged but stable cardiomediastinal silhouette. Dual lumen catheter from RIGHT IJ approach stable, with tips at the cavoatrial junction. Mild vascular congestion with slight improvement in interstitial edema. No definite consolidation. Atelectatic change at the LEFT base. IMPRESSION: Slight improvement aeration. Mild vascular congestion, with slight improvement in interstitial edema. Stable cardiac enlargement. Electronically Signed   By: Staci Righter M.D.   On: 11/13/2016 10:33   Dg Swallowing Func-speech Pathology  Result Date: 11/12/2016 Objective Swallowing Evaluation: Type of Study: MBS-Modified Barium Swallow Study Patient Details Name: [name] MRN: [id number] Date of Birth: Jun 22, 1930 Today's Date: 11/12/2016 Past Medical History: Past Medical History: Diagnosis Date . Anemia  . Asthma  . Atrial fibrillation ([place]) 12/08/2013 . CHF (congestive heart failure) ([place])  . Chronic joint pain   "from RA" (10/01/2016) . CKD (chronic kidney disease), stage III ([place])  . Constipation   takes stool softener daily . COPD (chronic obstructive pulmonary disease) ([place])  . Coronary artery disease  . Coronary artery disease involving native coronary artery of native heart with angina pectoris ([place]) 09/30/2016 . Dysrhythmia   a-fib . GERD (gastroesophageal reflux disease)   takes Zantac  daily  . History of blood transfusion   "related to one of my ORs" . Hyperlipidemia   takes Pravastatin daily . Hypertension   takes Cardizem,Proscar,and Lisinopril daily . Hypothyroidism  . Impaired hearing   wears hearing aids . Joint swelling   "from RA" (10/01/2016) . Peripheral neuropathy  . Pneumonia 2017 . Rheumatoid arthritis ([place])   "all over; take orencia  q 4 wks" (10/01/2016) . S/P CABG x 2 10/09/2016  LIMA to LAD, SVG to OM, EVH via right thigh . S/P Maze operation for atrial fibrillation 10/09/2016  Complete bilateral atrial lesion set using bipolar radiofrequency and cryothermy ablation with clipping of LA appendage . Type II diabetes mellitus ([place])  Past Surgical History: Past Surgical History: Procedure Laterality Date . AMPUTATION TOE Left 11/08/2015  Procedure: LEFT HALLUX AMPUTATION AT THE METATARSOPHALANGEAL JOINT;  Surgeon: Wylene Simmer, MD;  Location: [facility];  Service: Orthopedics;  Laterality: Left; . ANKLE FUSION Left 2014 . AV FISTULA PLACEMENT Right 10/28/2016  Procedure: ARTERIOVENOUS  FISTULA CREATION right arm;  Surgeon: Angelia Mould, MD;  Location: [facility];  Service: Vascular;  Laterality: Right; . BIOPSY THYROID  2014 . CARDIAC CATHETERIZATION  09/30/2016 . CARPAL TUNNEL RELEASE Bilateral 1990's . CATARACT EXTRACTION W/ INTRAOCULAR LENS  IMPLANT, BILATERAL Bilateral 1990's . CLIPPING OF ATRIAL APPENDAGE  10/09/2016  Procedure: CLIPPING OF ATRIAL APPENDAGE;  Surgeon: Rexene Alberts, MD;  Location: [facility];  Service: Open Heart Surgery;; . COLONOSCOPY   . CORONARY ARTERY BYPASS GRAFT N/A 10/09/2016  Procedure: CORONARY ARTERY BYPASS GRAFTING (CABG) x two, using left internal mammary artery and right leg greater saphenous vein harvested endoscopically;  Surgeon: Rexene Alberts, MD;  Location: [facility];  Service:  Open Heart Surgery;  Laterality: N/A; . FOREIGN BODY REMOVAL Right 09/06/2013  Procedure: REMOVAL FOREIGN BODY RIGHT INDEX FINGER;  Surgeon: Daryll Brod, MD;   Location: [facility];  Service: Orthopedics;  Laterality: Right;  ANESTHESIA: IV REGIONAL FAB . INSERTION OF DIALYSIS CATHETER N/A 10/28/2016  Procedure: INSERTION OF DIALYSIS CATHETER;  Surgeon: Angelia Mould, MD;  Location: [facility];  Service: Vascular;  Laterality: N/A; . JOINT REPLACEMENT   . MAZE N/A 10/09/2016  Procedure: MAZE;  Surgeon: Rexene Alberts, MD;  Location: [facility];  Service: Open Heart Surgery;  Laterality: N/A; . REPAIR TENDONS FOOT Left 1946; 1984  "farming accident; repaired tendons both times" . RIGHT/LEFT HEART CATH AND CORONARY ANGIOGRAPHY N/A 09/30/2016  Procedure: RIGHT/LEFT HEART CATH AND CORONARY ANGIOGRAPHY;  Surgeon: Nigel Mormon, MD;  Location: [facility];  Service: Cardiovascular;  Laterality: N/A; . SHOULDER ARTHROSCOPY W/ ROTATOR CUFF REPAIR Right 1990's . TEE WITHOUT CARDIOVERSION N/A 10/09/2016  Procedure: TRANSESOPHAGEAL ECHOCARDIOGRAM (TEE);  Surgeon: Rexene Alberts, MD;  Location: [facility];  Service: Open Heart Surgery;  Laterality: N/A; . THYROIDECTOMY Right 02/01/2013  Procedure: RIGHT THYROIDECTOMY LOBECTOMY;  Surgeon: Earnstine Regal, MD;  Location: [facility];  Service: General;  Laterality: Right; . TONSILLECTOMY  1930's . TOTAL KNEE ARTHROPLASTY Right 12/16/2010  Procedure: TOTAL KNEE ARTHROPLASTY; rt Surgeon: Rudean Haskell, MD;  Location: [facility];  Service: Orthopedics;  Laterality: Right; . TOTAL SHOULDER REPLACEMENT Left 03/2010 HPI: Patient is an 83 year old male with history of hypertension, COPD, GERD, pna, chronic diastolic congestive heart failure, stage IV chronic kidney disease, hyperlipidemia, recurrent paroxysmal atrial fibrillation, bronchiectasis, asthma, rheumatoid arthritis, type 2 diabetes mellitus, and anemia. Underwent CABG 9/27. Intubated 9/27-9/28, 10/3-10/8. CXR RIGHT pigtail catheter no longer seen. No pneumothorax. Slight improvement in pulmonary edema. Started on Dys 3, honey thick liquids diet per MBS.  Repeat study  ordered today to determine readiness to advance given improved presentation at bedside.   No Data Recorded Assessment / Plan / Recommendation CHL IP CLINICAL IMPRESSIONS 11/12/2016 Clinical Impression Pt presents with improvements in swallowing function in comparison to initial MBS but continues to demonstrate a mild-moderate oropharyngeal dysphagia most remarkable for decreased laryngeal closure across all boluses.  Pt had silent aspiration of large, consecutive boluses of honey thick liquids which were mitigated with supervision cues for rate and portion control.  Pt also had high penetration into the laryngeal vestibule with nectar thick liquids which cleared with supervision cues for volitional throat clear followed by second swallow.  Penetration with thin liquids was deeper into the vestibule and did not clear as effectively with the abovementioned strategies or with trials of chin tuck posture.  Suspect respiratory deficits are primary factor influencing pt's swallowing dysfunction.  Recommend advancing pt to nectar thick liquids with full supervision for use of the following swallow precautions: upright during meals, small bites/sips, clear throat after every sip of liquids and swallow twice.  Prognosis for advancement is good with ongoing ST interventions for pharyngeal strengthening, EMST, and management of safe diet progression.   SLP Visit Diagnosis Dysphagia, oropharyngeal phase (R13.12) Attention and concentration deficit following -- Frontal lobe and executive function deficit following -- Impact on safety and function Moderate aspiration risk     CHL IP DIET RECOMMENDATION 11/12/2016 SLP Diet Recommendations Dysphagia 3 (Mech soft) solids;Nectar thick liquid Liquid Administration via Cup Medication Administration Whole meds with puree Compensations Slow rate;Small sips/bites;Hard cough after swallow;Multiple dry swallows after each bite/sip Postural Changes Seated upright at  90 degrees             CHL IP ORAL PHASE 11/12/2016 Oral Phase Impaired Oral - Pudding Teaspoon -- Oral - Pudding Cup -- Oral - Honey Teaspoon -- Oral - Honey Cup WFL Oral - Nectar Teaspoon -- Oral - Nectar Cup WFL Oral - Nectar Straw -- Oral - Thin Teaspoon -- Oral - Thin Cup Piecemeal swallowing Oral - Thin Straw Premature spillage Oral - Puree -- Oral - Mech Soft -- Oral - Regular -- Oral - Multi-Consistency -- Oral - Pill -- Oral Phase - Comment --  CHL IP PHARYNGEAL PHASE 11/12/2016 Pharyngeal Phase Impaired Pharyngeal- Pudding Teaspoon -- Pharyngeal -- Pharyngeal- Pudding Cup -- Pharyngeal -- Pharyngeal- Honey Teaspoon -- Pharyngeal -- Pharyngeal- Honey Cup Penetration/Aspiration during swallow;Reduced airway/laryngeal closure;Reduced epiglottic inversion;Reduced laryngeal elevation Pharyngeal Material enters airway, passes BELOW cords without attempt by patient to eject out (silent aspiration) Pharyngeal- Nectar Teaspoon -- Pharyngeal -- Pharyngeal- Nectar Cup Reduced laryngeal elevation;Reduced airway/laryngeal closure;Penetration/Aspiration during swallow Pharyngeal Material enters airway, remains ABOVE vocal cords and not ejected out Pharyngeal- Nectar Straw -- Pharyngeal -- Pharyngeal- Thin Teaspoon -- Pharyngeal -- Pharyngeal- Thin Cup Reduced airway/laryngeal closure;Reduced laryngeal elevation;Penetration/Aspiration during swallow Pharyngeal Material enters airway, remains ABOVE vocal cords and not ejected out Pharyngeal- Thin Straw Reduced laryngeal elevation;Reduced airway/laryngeal closure;Penetration/Aspiration during swallow Pharyngeal Material enters airway, CONTACTS cords and not ejected out Pharyngeal- Puree -- Pharyngeal -- Pharyngeal- Mechanical Soft -- Pharyngeal -- Pharyngeal- Regular -- Pharyngeal -- Pharyngeal- Multi-consistency -- Pharyngeal -- Pharyngeal- Pill -- Pharyngeal -- Pharyngeal Comment --  CHL IP CERVICAL ESOPHAGEAL PHASE 11/12/2016 Cervical Esophageal Phase WFL Pudding Teaspoon -- Pudding Cup --  Honey Teaspoon -- Honey Cup -- Nectar Teaspoon -- Nectar Cup -- Nectar Straw -- Thin Teaspoon -- Thin Cup -- Thin Straw -- Puree -- Mechanical Soft -- Regular -- Multi-consistency -- Pill -- Cervical Esophageal Comment -- No flowsheet data found. Page, Selinda Orion 11/12/2016, 10:24 AM               Recent Labs  11/12/16 1456  WBC 13.8*  HGB 8.4*  HCT 26.6*  PLT 257    Recent Labs  11/12/16 1456  NA 131*  K 4.2  CL 94*  GLUCOSE 80  BUN 34*  CREATININE 5.87*  CALCIUM 8.0*   CBG (last 3)   Recent Labs  11/13/16 1620 11/13/16 2107 11/14/16 0625  GLUCAP 218* 85 76    Wt Readings from Last 3 Encounters:  11/14/16 102.1 kg (225 lb)  11/03/16 92.6 kg (204 lb 2.3 oz)  10/06/16 99 kg (218 lb 5 oz)    Physical Exam:  BP (!) 98/49 (BP Location: Left Arm)   Pulse 72   Temp (!) 94.5 F (34.7 C) (Oral)   Resp 18   Ht 5\' 8"  (1.727 m)   Wt 102.1 kg (225 lb)   SpO2 96%   BMI 34.21 kg/m  Constitutional: He appears well-developed. NAD. Frail  HENT: Normocephalic and atraumatic.  Eyes: EOM are normal. No discharge.  Cardiovascular: RRR. No JVD. Respiratory: + Scattered Rales.  Normal effort  GI: BS +, non-distended   Musculoskeletal: He exhibits no edema. Neurological:  Alert and oriented Motor: 4-/5 throughout (upper extremities stronger than lower extremities, unchanged) Skin: Skin is warm and dry. Intact.  Psychiatric: Normal mood and behavior.  Assessment/Plan: 1. Functional deficits secondary to debility which require 3+ hours per day of interdisciplinary therapy in a comprehensive inpatient rehab setting. Physiatrist is providing close team supervision and 24  hour management of active medical problems listed below. Physiatrist and rehab team continue to assess barriers to discharge/monitor patient progress toward functional and medical goals.  Function:  Bathing Bathing position   Position: Wheelchair/chair at sink  Bathing parts Body parts bathed by patient:  Right arm, Left arm, Chest, Abdomen, Left upper leg, Right lower leg, Left lower leg, Right upper leg, Front perineal area Body parts bathed by helper: Buttocks  Bathing assist Assist Level: Touching or steadying assistance(Pt > 75%)      Upper Body Dressing/Undressing Upper body dressing   What is the patient wearing?: Pull over shirt/dress     Pull over shirt/dress - Perfomed by patient: Thread/unthread left sleeve, Put head through opening, Pull shirt over trunk Pull over shirt/dress - Perfomed by helper: Thread/unthread right sleeve        Upper body assist Assist Level: Supervision or verbal cues      Lower Body Dressing/Undressing Lower body dressing   What is the patient wearing?: Non-skid slipper socks, Ted Hose, Pants     Pants- Performed by patient: Thread/unthread right pants leg, Thread/unthread left pants leg, Pull pants up/down Pants- Performed by helper: Pull pants up/down Non-skid slipper socks- Performed by patient: Don/doff right sock, Don/doff left sock Non-skid slipper socks- Performed by helper: Don/doff right sock, Don/doff left sock               TED Hose - Performed by helper: Don/doff right TED hose, Don/doff left TED hose  Lower body assist Assist for lower body dressing:  (helper completed all, total assist)      Toileting Toileting Toileting activity did not occur: No continent bowel/bladder event   Toileting steps completed by helper: Adjust clothing prior to toileting, Performs perineal hygiene, Adjust clothing after toileting Toileting Assistive Devices: Grab bar or rail  Toileting assist Assist level: Two helpers   Transfers Chair/bed transfer   Chair/bed transfer method: Stand pivot Chair/bed transfer assist level: Moderate assist (Pt 50 - 74%/lift or lower) Chair/bed transfer assistive device: Armrests, Medical sales representative Ambulation activity did not occur: Refused   Max distance: 52 ft Assist level: Touching or  steadying assistance (Pt > 75%)   Wheelchair Wheelchair activity did not occur: Refused Type: Manual      Cognition Comprehension Comprehension assist level: Follows complex conversation/direction with extra time/assistive device  Expression Expression assist level: Expresses complex ideas: With extra time/assistive device  Social Interaction Social Interaction assist level: Interacts appropriately with others - No medications needed.  Problem Solving Problem solving assist level: Solves basic 90% of the time/requires cueing < 10% of the time  Memory Memory assist level: Recognizes or recalls 90% of the time/requires cueing < 10% of the time    Medical Problem List and Plan: 1.  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 Patient has sternal precautions  Cont CIR 2.  DVT Prophylaxis/Anticoagulation: Chronic Coumadin therapy for history of PAF   INR Subtherapeutic on 11/2 3. Pain Management: Neurontin 300 mg twice a day, Ultram as needed 4. Mood: Provide emotional support 5. Neuropsych: This patient is capable of making decisions on his own behalf. 6. Skin/Wound Care: Routine skin checks 7. Fluids/Electrolytes/Nutrition: Routine I&Os 8.AKI/CKD stage 3-4. Follow-up renal services. Presently receiving hemodialysis Monday Wednesday Friday schedule monitoring for recovery of renal function.S/P right IJ tunneled dialysis catheter/right brachiocephalic AV fistula 32/44/0102 9. Acute on chronic anemia. Continue iron supplement  Hb 8.4 on 10/31  Cont to monitor 10. Dysphagia. Dysphagia #3 nectar liquids.  Monitor hydration.   Advance diet as tolerated 11. Chronic diastolic congestive heart failure. Monitor for any signs of fluid overload.  Filed Weights   11/12/16 1915 11/13/16 0432 11/14/16 0525  Weight: 91.4 kg (201 lb 8 oz) 90.7 kg (200 lb) 102.1 kg (225 lb)   Unreliable  CXR reviewed, suggesting CHF  Cont supplemental  Oxygen  Supplemental oxygen dependent, 2 L  at home. 12. PAF. Chronic Coumadin, Lopressor 12.5 mg twice a day, amiodarone 200 mg every 12 hours, Midodrine 10 mg 3 times a day  Appreciate cards recs, notes reviewed 13. Diabetes mellitus and peripheral neuropathy. Hemoglobin A1c 7.2. Check blood sugars before meals and at bedtime.  Reduced levemir to 20 u daily on 10/28  Will maintain SSI given initiation of steroids  Relatively controlled on 11/2 14. BPH. Proscar 5 mg daily, Flomax 0.4 mg daily 15. Hyperlipidemia. Lipitor 16. Hypothyroidism. Synthroid 17.  Hypotension   See  above  Related to HD.  Fluid management per nephro. 18.  RA  Monthly infusion PTA.    Discussed with pharmacy home medication/dosage, likely on orencia, not due until later this month  Voltaren GEL ordered with improvement  Lidoderm patch also ordered on 10/31  Prednisone 5mg  started on 11/1 19. Leukocytosis  WBCs 13.8 on 10/31, labs with HD  Repeat CXR reviewed, slight improvement  Continue to monitor  Afebrile   LOS (Days) 11 A FACE TO FACE EVALUATION WAS PERFORMED  Maanvi Lecompte Lorie Phenix 11/14/2016 8:21 AM

## 2016-11-14 NOTE — Progress Notes (Signed)
Occupational Therapy Session Note  Patient Details  Name: [name] MRN: [id number] Date of Birth: Feb 04, 1930  Today's Date: 11/14/2016 OT Individual Time: 9774-1423 OT Individual Time Calculation (min): 59 min    Short Term Goals: Week 2:  OT Short Term Goal 1 (Week 2): Continue working on established supervision level goals for discharge.  Skilled Therapeutic Interventions/Progress Updates:    Pt completed bathing and dressing sit to stand at the sink during session.  Pt's BP taken in sitting at 100/42 with O2 sats at 98% on room air as well.  Pt able to complete all UB selfcare this session at supervision level with min assist for LB selfcare sit to stand.  Min instructional cueing for hand placement with sit to stand and to no pull up on the counter with both hands.  He was able to maintain standing for up to 1.5 mins while completing peri washing but then needed rest secondary to fatigue.  He was able to donn all LB clothing over feet with supervision except for TEDs.  Finished session with completion of combing his hair in sitting.  Pt left with call button and phone in reach and safety belt in place.    Therapy Documentation Precautions:  Precautions Precautions: Fall, Sternal Restrictions Weight Bearing Restrictions: No RUE Weight Bearing: Weight bearing as tolerated  Pain: Pain Assessment Pain Assessment: No/denies pain ADL: See Function Navigator for Current Functional Status.   Therapy/Group: Individual Therapy  Janeice Stegall OTR/L 11/14/2016, 9:22 AM

## 2016-11-15 ENCOUNTER — Inpatient Hospital Stay (HOSPITAL_COMMUNITY): Payer: Medicare Other | Admitting: Occupational Therapy

## 2016-11-15 ENCOUNTER — Inpatient Hospital Stay (HOSPITAL_COMMUNITY): Payer: Medicare Other | Admitting: Physical Therapy

## 2016-11-15 DIAGNOSIS — N184 Chronic kidney disease, stage 4 (severe): Secondary | ICD-10-CM

## 2016-11-15 LAB — PROTIME-INR
INR: 1.79
Prothrombin Time: 20.7 seconds — ABNORMAL HIGH (ref 11.4–15.2)

## 2016-11-15 LAB — GLUCOSE, CAPILLARY
GLUCOSE-CAPILLARY: 106 mg/dL — AB (ref 65–99)
GLUCOSE-CAPILLARY: 191 mg/dL — AB (ref 65–99)
GLUCOSE-CAPILLARY: 75 mg/dL (ref 65–99)
Glucose-Capillary: 119 mg/dL — ABNORMAL HIGH (ref 65–99)
Glucose-Capillary: 132 mg/dL — ABNORMAL HIGH (ref 65–99)

## 2016-11-15 MED ORDER — WHITE PETROLATUM EX OINT
TOPICAL_OINTMENT | CUTANEOUS | Status: AC
  Administered 2016-11-15: 09:00:00
  Filled 2016-11-15: qty 28.35

## 2016-11-15 MED ORDER — WARFARIN SODIUM 2 MG PO TABS
1.0000 mg | ORAL_TABLET | Freq: Once | ORAL | Status: AC
  Administered 2016-11-15: 1 mg via ORAL
  Filled 2016-11-15: qty 0.5

## 2016-11-15 NOTE — Progress Notes (Signed)
ANTICOAGULATION CONSULT NOTE -  Follow up Pharmacy Consult for Warfarin Indication: atrial fibrillation  No Known Allergies  Patient Measurements: Height: 5\' 8"  (172.7 cm) Weight: 223 lb (101.2 kg) IBW/kg (Calculated) : 68.4  Vital Signs: Temp: 97.4 F (36.3 C) (11/03 0610) Temp Source: Oral (11/03 0610) BP: 97/83 (11/03 0610) Pulse Rate: 78 (11/03 0610)  Labs:  Recent Labs  11/12/16 1456 11/13/16 0425 11/14/16 0510 11/14/16 1842 11/15/16 0604  HGB 8.4*  --   --  8.6*  --   HCT 26.6*  --   --  27.5*  --   PLT 257  --   --  189  --   LABPROT  --  19.8* 19.2*  --  20.7*  INR  --  1.70 1.63  --  1.79  CREATININE 5.87*  --   --  5.47*  --     Estimated Creatinine Clearance: 11.6 mL/min (A) (by C-G formula based on SCr of 5.47 mg/dL (H)).   Assessment: 27 yoM transferred to CIR on 10/22 after a prolonged hospital stay that included CABG/MAZE with AKI and new ESRD. He has a h/o afib on apixaban PTA, which was switched to warfarin on 10/10/16 during the last admission.   INR 1.79, and slowly trending up. Patient on amiodarone. No bleeding reported.     Goal of Therapy:  INR 2-3 Monitor platelets by anticoagulation protocol: Yes   Plan:  Repeat Coumadin 1mg  po x 1 tonight Daily INR, monitor for s/sx of bleeding  Leroy Libman, PharmD Pharmacy Resident Pager: [phone]

## 2016-11-15 NOTE — Progress Notes (Signed)
Physical Therapy Session Note  Patient Details  Name: [name] MRN: [id number] Date of Birth: 08-Aug-1930  Today's Date: 11/15/2016 PT Individual Time: 0800-0855 AND 3220-1992 PT Individual Time Calculation (min): 55 min AND 20 min  Short Term Goals: Week 2:  PT Short Term Goal 1 (Week 2): =LTGs due to ELOS  Skilled Therapeutic Interventions/Progress Updates:   Session 1: Pt sitting up and eating breakfast upon arrival, agreeable to therapy and no c/o pain. Worked on endurance w/ functional mobility this session. Transferred to EOB w/ Min A and provided Min A to change into new t-shirt. Verbal cues for sternal precautions. Pt on room air this session and O2 sats >90% throughout. Ambulated 4', 37', and 30' x2 w/ supervision to [place] guard for safety using RW. Seated rest break in between bouts 2/2 increased work of breathing that resolves w/ rest. Returned to w/c in room and worked on [employer], performed sit<>stand x5 w/ supervision and emphasis on no UE support once in standing. Able to stand 3-5 seconds before needing to sit down. Performed bilateral LAQs in between bouts. Ended session in w/c, call bell within reach and all needs met.   Session 2:  Pt supine upon arrival and agreeable to therapy, no c/o pain. Family reporting pt's BP has been low this afternoon. Transferred to EOB and to w/c w/ Min-Mod A overall w/o RW support. Pt c/o not feeling well in w/c, BP 95/55 at this time. Focused on breathing techniques and tolerance to upright sitting for 10+ minutes. BP then 86/50 and pt still c/o of feeling unwell, denied feeling lightheaded or dizzy. Ended session at this point 2/2 low BP and transferred back to supine w/ Mod A. Ended session in supine and in care of family, all needs met. RN made aware of status and BP.  Therapy Documentation Precautions:  Precautions Precautions: Fall, Sternal Restrictions Weight Bearing Restrictions: No RUE Weight Bearing: Weight bearing as  tolerated Vital Signs:   See Function Navigator for Current Functional Status.   Therapy/Group: Individual Therapy  Cedra Villalon K Arnette 11/15/2016, 10:45 AM

## 2016-11-15 NOTE — Progress Notes (Signed)
Occupational Therapy Session Note  Patient Details  Name: KWAKU MOSTAFA MRN: [id number] Date of Birth: Jul 17, 1930  Today's Date: 11/15/2016 OT Individual Time: 1105-1201 OT Individual Time Calculation (min): 56 min   Short Term Goals: Week 2:  OT Short Term Goal 1 (Week 2): Continue working on established supervision level goals for discharge.  Skilled Therapeutic Interventions/Progress Updates:    Tx focus on family education, ADL retraining, and energy conservation during self care tasks.   Pt greeted supine in bed with family present. Agreeable to tx. BP 100/45 02 sats 99% on RA while he was supine. Pt transitioned to EOB with Mod A for precaution adherence. Sit<stand with Min A for doffing pants in order to don thigh high Teds. Trained caregiver (daughter) on adaptive method for donning Teds to increase ease of completion. Daughter with hands on practice. Pt donning shorts and gripper socks with extra time, required 1 supine rest break due to fatigue. He was able to boost himself up with use of LEs when bed was placed in trendelenberg. With bed positioned flat, BP 112/50, 02 sats 96%. He then reported needing to void, transitioned back to EOB and transferred to w/c. Pt reported desire to ambulate vs. Stand pivot. He ambulated into bathroom to [facility] placed over toilet with Min A and RW. Pt able to complete clothing mgt with cues and balance assist. Pt ultimately unable to void, able to lift his pants up in standing, but required 2 seated rest breaks to do so completely. He then ambulated back to EOB (required max cues for proper hand placement during all transfers). Visitors had just arrived. Pt left EOB with NT and visitors at time of departure.   Throughout session, discussed pts functional application of sternal precautions with family when he d/c home. Educated them on hand placement during sit<stands, and methods to facilitate max level of independence during ADLs while encouraging energy  conservation practices. Family very receptive and appreciative of education today.    Therapy Documentation Precautions:  Precautions Precautions: Fall, Sternal Restrictions Weight Bearing Restrictions: No RUE Weight Bearing: Weight bearing as tolerated General: General PT Missed Treatment Reason: Patient fatigue;Other (Comment) (Low blood pressure, c/o of not feeling well) Vital Signs: Therapy Vitals Temp: 97.8 F (36.6 C) Temp Source: Oral Pulse Rate: 85 Resp: 20 BP: (!) 86/50 Patient Position (if appropriate): Lying Oxygen Therapy SpO2: 98 % O2 Device: Not Delivered Pain: No c/o pain during tx    ADL: ADL ADL Comments: Please see functional navigator :    See Function Navigator for Current Functional Status.   Therapy/Group: Individual Therapy  Deronda Christian A Merdith Adan 11/15/2016, 4:46 PM

## 2016-11-15 NOTE — Progress Notes (Signed)
  [facility] Progress Note   Assessment/ Plan:   1 ESRD- 2K bath.  Trying UF profile 4 to augment fluid removal, 35 C temp.  UF excellent last night, pt has therapy, family, and dog coming to watch [place] game tonight.  Eventually needs to get on TTS schedule, and isn't requiring O2.  Discussed fluid restriction, will dialyze Monday and Tuesday with close vol status exams over weekend.  2 Hypertension/ volume: BP relatively low, midodrine 10 mg TID.    3 CAD- s/p CABG/Maze.  On warfarin  4 Deconditioning and debility- in CIR  5 DM- per primary  6 Anemia- last Hgb 8.8, on Aranesp 200 mcg q Wednesday and ferrlicet 993 mcg x 8 doses, started 10/26  7 Bone/ mineral: Phso 6.5, add velphoro 500 mg TID with meals, calcitriol   8 Dispo: 11/9 is d/c date    Subjective:    Sitting in bed, family at bedside.   Objective:   BP 97/83 (BP Location: Left Arm)   Pulse 78   Temp (!) 97.4 F (36.3 C) (Oral)   Resp 18   Ht 5\' 8"  (1.727 m)   Wt 101.2 kg (223 lb)   SpO2 94%   BMI 33.91 kg/m   Physical Exam: General appearance: NAD Resp: R > L crackles, improved Chest wall: RIJ tunneled HD cath Cardio: regular rate and rhythm, S1, S2 normal  GI: mild distension, decreased bs, nontender Extremities: 1+ edema, improved, AVF RUA with +T/B, R hand well-perfused.  Peeling skin bilateral hands.  Labs: BMET  Recent Labs Lab 11/10/16 1630 11/12/16 1456 11/14/16 1842  NA 131* 131* 129*  K 4.5 4.2 4.4  CL 92* 94* 95*  CO2 26 26 24   GLUCOSE 93 80 201*  BUN 47* 34* 40*  CREATININE 7.31* 5.87* 5.47*  CALCIUM 8.1* 8.0* 8.0*  PHOS 6.5* 3.6 2.9   CBC  Recent Labs Lab 11/10/16 1630 11/12/16 1456 11/14/16 1842  WBC 12.0* 13.8* 11.9*  HGB 8.8* 8.4* 8.6*  HCT 28.1* 26.6* 27.5*  MCV 91.5 92.4 92.9  PLT 318 257 189    @IMGRELPRIORS @ Medications:    . amiodarone  200 mg Oral Q12H  . aspirin  81 mg Per Tube Daily  . atorvastatin  40 mg Oral q1800  . [START ON  11/20/2016] darbepoetin (ARANESP) injection - DIALYSIS  200 mcg Intravenous Q Thu-HD  . diclofenac sodium  2 g Topical QID  . feeding supplement (GLUCERNA SHAKE)  237 mL Oral BID BM  . finasteride  5 mg Oral QHS  . gabapentin  300 mg Oral BID  . Gerhardt's butt cream   Topical BID  . guaiFENesin  600 mg Oral BID  . hydrocortisone cream   Topical BID  . insulin aspart  0-15 Units Subcutaneous TID WC  . insulin detemir  20 Units Subcutaneous Daily  . levothyroxine  25 mcg Per Tube QAC breakfast  . lidocaine  1 patch Transdermal Q24H  . midodrine  10 mg Oral TID WC  . multivitamin  1 tablet Oral QHS  . pantoprazole  40 mg Oral Daily  . predniSONE  5 mg Oral Q breakfast  . tamsulosin  0.4 mg Oral QHS  . warfarin  1 mg Oral ONCE-1800  . Warfarin - Pharmacist Dosing Inpatient   Does not apply q1800     Madelon Lips MD [facility] pgr [phone] 11/15/2016, 2:28 PM

## 2016-11-15 NOTE — Progress Notes (Signed)
[name] is a 83 y.o. male [birth year] [id number]  Subjective: In bed with family visiting.  Reports working well with therapy and denies shortness of breath or pain.  Objective: Vital signs in last 24 hours: Temp:  [97.4 F (36.3 C)-98.9 F (37.2 C)] 97.4 F (36.3 C) (11/03 0610) Pulse Rate:  [72-95] 78 (11/03 0610) Resp:  [18] 18 (11/03 0610) BP: (97-125)/(45-83) 97/83 (11/03 0610) SpO2:  [92 %-95 %] 94 % (11/03 0610) Weight:  [101.2 kg (223 lb)-104.3 kg (230 lb)] 101.2 kg (223 lb) (11/03 0610) Weight change: 2.268 kg (5 lb) Last BM Date: 11/13/16  Intake/Output from previous day: 11/02 0701 - 11/03 0700 In: 600 [P.O.:600] Out: 2510   Physical Exam General: No apparent distress   wife and daughter at bedside Lungs: Normal effort. Lungs clear to auscultation, no crackles or wheezes. Cardiovascular: Regular rate and rhythm, no edema  Lab Results: BMET    Component Value Date/Time   NA 129 (L) 11/14/2016 1842   K 4.4 11/14/2016 1842   CL 95 (L) 11/14/2016 1842   CO2 24 11/14/2016 1842   GLUCOSE 201 (H) 11/14/2016 1842   BUN 40 (H) 11/14/2016 1842   CREATININE 5.47 (H) 11/14/2016 1842   CALCIUM 8.0 (L) 11/14/2016 1842   GFRNONAA 9 (L) 11/14/2016 1842   GFRAA 10 (L) 11/14/2016 1842   CBC    Component Value Date/Time   WBC 11.9 (H) 11/14/2016 1842   RBC 2.96 (L) 11/14/2016 1842   HGB 8.6 (L) 11/14/2016 1842   HCT 27.5 (L) 11/14/2016 1842   PLT 189 11/14/2016 1842   MCV 92.9 11/14/2016 1842   MCH 29.1 11/14/2016 1842   MCHC 31.3 11/14/2016 1842   RDW 17.7 (H) 11/14/2016 1842   LYMPHSABS 1.3 11/04/2016 0541   MONOABS 0.6 11/04/2016 0541   EOSABS 0.5 11/04/2016 0541   BASOSABS 0.0 11/04/2016 0541   CBG's (last 3):    Recent Labs  11/15/16 0002 11/15/16 0701 11/15/16 1200  GLUCAP 75 106* 119*   LFT's Lab Results  Component Value Date   ALT 23 11/05/2016   AST 17 11/05/2016   ALKPHOS 103 11/05/2016   BILITOT 0.9 11/05/2016     Studies/Results: No results found.  Medications:  I have reviewed the patient's current medications. Scheduled Medications: . amiodarone  200 mg Oral Q12H  . aspirin  81 mg Per Tube Daily  . atorvastatin  40 mg Oral q1800  . [START ON 11/20/2016] darbepoetin (ARANESP) injection - DIALYSIS  200 mcg Intravenous Q Thu-HD  . diclofenac sodium  2 g Topical QID  . feeding supplement (GLUCERNA SHAKE)  237 mL Oral BID BM  . finasteride  5 mg Oral QHS  . gabapentin  300 mg Oral BID  . Gerhardt's butt cream   Topical BID  . guaiFENesin  600 mg Oral BID  . hydrocortisone cream   Topical BID  . insulin aspart  0-15 Units Subcutaneous TID WC  . insulin detemir  20 Units Subcutaneous Daily  . levothyroxine  25 mcg Per Tube QAC breakfast  . lidocaine  1 patch Transdermal Q24H  . midodrine  10 mg Oral TID WC  . multivitamin  1 tablet Oral QHS  . pantoprazole  40 mg Oral Daily  . predniSONE  5 mg Oral Q breakfast  . tamsulosin  0.4 mg Oral QHS  . warfarin  1 mg Oral ONCE-1800  . Warfarin - Pharmacist Dosing Inpatient   Does not apply q1800  . white petrolatum  PRN Medications: acetaminophen, camphor-menthol, guaiFENesin, ondansetron **OR** ondansetron (ZOFRAN) IV, RESOURCE THICKENUP CLEAR, sorbitol, traMADol, traZODone  Assessment/Plan: Principal Problem:   Debility Active Problems:   Rheumatoid arthritis (HCC)   CKD (chronic kidney disease) stage 4, GFR 15-29 ml/min (HCC)   Type 2 diabetes mellitus with peripheral neuropathy (HCC)   PAF (paroxysmal atrial fibrillation) (HCC)   Labile blood glucose   Primary osteoarthritis of right elbow   Length of stay, days: 12   #1 Debility following complicated surgery late September.  Continue inpatient rehab PT OT for conditioning as ongoing 2.  Coronary disease status post CABG and Maze procedure September 27.  Continue ongoing cardiac medications.  Stable at this time 3.  Acute kidney injury on hemodialysis Monday Wednesday Friday  since surgery.  Volume controlled at this time 4.  Rheumatoid arthritis exacerbation with initiation of steroids November 1.  Continue same 5.  Type 2 diabetes, ischemic control exacerbated by steroids.  Continue ongoing medication with sliding scale as indicated 6.  PAF.  Continue ongoing rate control medications and anticoagulation.  Controlled  [name] A. Asa Lente, MD 11/15/2016, 12:42 PM

## 2016-11-16 ENCOUNTER — Inpatient Hospital Stay (HOSPITAL_COMMUNITY): Payer: Medicare Other

## 2016-11-16 LAB — GLUCOSE, CAPILLARY
GLUCOSE-CAPILLARY: 151 mg/dL — AB (ref 65–99)
GLUCOSE-CAPILLARY: 165 mg/dL — AB (ref 65–99)
Glucose-Capillary: 93 mg/dL (ref 65–99)
Glucose-Capillary: 146 mg/dL — ABNORMAL HIGH (ref 65–99)

## 2016-11-16 LAB — PROTIME-INR
INR: 1.62
PROTHROMBIN TIME: 19.1 s — AB (ref 11.4–15.2)

## 2016-11-16 MED ORDER — WARFARIN SODIUM 5 MG PO TABS
2.5000 mg | ORAL_TABLET | Freq: Once | ORAL | Status: AC
  Administered 2016-11-16: 2.5 mg via ORAL
  Filled 2016-11-16: qty 1

## 2016-11-16 NOTE — Progress Notes (Signed)
  [facility] Progress Note   Assessment/ Plan:   1 ESRD- 2K bath.  Trying UF profile 4 to augment fluid removal, 35 C temp.  Discussed fluid restriction, will dialyze Monday and Tuesday with close vol status exams over weekend.  TTS schedule as OP, has been CLIP'd to [place]  2 Hypertension/ volume: BP relatively low, midodrine 10 mg TID.    3 CAD- s/p CABG/Maze.  On warfarin  4 Deconditioning and debility- in CIR  5 DM- per primary  6 Anemia- last Hgb 8.8, on Aranesp 200 mcg q Wednesday and ferrlicet 778 mcg x 8 doses, started 10/26  7 Bone/ mineral: Phso 6.5, add velphoro 500 mg TID with meals, calcitriol   8.  L hand numbness--> 3rd-5th fingers of R hand.  Hand well-perfused, ? Ulnar neuropathy vs mild steal, will have VVS eval tomorrow 11/5.  9 Dispo: 11/9 is d/c date    Subjective:    Sleeping, easily arousable.  Happy about football game last night   Objective:   BP (!) 103/48 (BP Location: Left Arm)   Pulse 75   Temp 97.8 F (36.6 C) (Oral)   Resp 18   Ht 5\' 8"  (1.727 m)   Wt 100.7 kg (222 lb)   SpO2 98%   BMI 33.75 kg/m   Physical Exam: General appearance: NAD Resp: R > L crackles anteriorly Chest wall: RIJ tunneled HD cath Cardio: regular rate and rhythm, S1, S2 normal  GI: mild distension, decreased bs, nontender Extremities: 1+ edema, AVF RUA with +T/B, R hand well-perfused.  Peeling skin bilateral hands.  Labs: BMET Recent Labs  Lab 11/10/16 1630 11/12/16 1456 11/14/16 1842  NA 131* 131* 129*  K 4.5 4.2 4.4  CL 92* 94* 95*  CO2 26 26 24   GLUCOSE 93 80 201*  BUN 47* 34* 40*  CREATININE 7.31* 5.87* 5.47*  CALCIUM 8.1* 8.0* 8.0*  PHOS 6.5* 3.6 2.9   CBC Recent Labs  Lab 11/10/16 1630 11/12/16 1456 11/14/16 1842  WBC 12.0* 13.8* 11.9*  HGB 8.8* 8.4* 8.6*  HCT 28.1* 26.6* 27.5*  MCV 91.5 92.4 92.9  PLT 318 257 189    @IMGRELPRIORS @ Medications:    . amiodarone  200 mg Oral Q12H  . aspirin  81 mg Per Tube Daily   . atorvastatin  40 mg Oral q1800  . [START ON 11/20/2016] darbepoetin (ARANESP) injection - DIALYSIS  200 mcg Intravenous Q Thu-HD  . diclofenac sodium  2 g Topical QID  . feeding supplement (GLUCERNA SHAKE)  237 mL Oral BID BM  . finasteride  5 mg Oral QHS  . gabapentin  300 mg Oral BID  . Gerhardt's butt cream   Topical BID  . guaiFENesin  600 mg Oral BID  . hydrocortisone cream   Topical BID  . insulin aspart  0-15 Units Subcutaneous TID WC  . insulin detemir  20 Units Subcutaneous Daily  . levothyroxine  25 mcg Per Tube QAC breakfast  . lidocaine  1 patch Transdermal Q24H  . midodrine  10 mg Oral TID WC  . multivitamin  1 tablet Oral QHS  . pantoprazole  40 mg Oral Daily  . predniSONE  5 mg Oral Q breakfast  . tamsulosin  0.4 mg Oral QHS  . warfarin  2.5 mg Oral ONCE-1800  . Warfarin - Pharmacist Dosing Inpatient   Does not apply q1800     Madelon Lips MD [facility] pgr [phone] 11/16/2016, 11:33 AM

## 2016-11-16 NOTE — Progress Notes (Signed)
ANTICOAGULATION CONSULT NOTE -  Follow up Pharmacy Consult for Warfarin Indication: atrial fibrillation  No Known Allergies  Patient Measurements: Height: 5\' 8"  (172.7 cm) Weight: 222 lb (100.7 kg) IBW/kg (Calculated) : 68.4  Vital Signs: Temp: 97.8 F (36.6 C) (11/04 0527) Temp Source: Oral (11/04 0527) BP: 103/48 (11/04 0527) Pulse Rate: 75 (11/04 0527)  Labs: Recent Labs    11/14/16 0510 11/14/16 1842 11/15/16 0604 11/16/16 0610  HGB  --  8.6*  --   --   HCT  --  27.5*  --   --   PLT  --  189  --   --   LABPROT 19.2*  --  20.7* 19.1*  INR 1.63  --  1.79 1.62  CREATININE  --  5.47*  --   --     Estimated Creatinine Clearance: 11.6 mL/min (A) (by C-G formula based on SCr of 5.47 mg/dL (H)).   Assessment: 28 yoM transferred to CIR on 10/22 after a prolonged hospital stay that included CABG/MAZE with AKI and new ESRD. He has a h/o afib on apixaban PTA, which was switched to warfarin on 10/10/16 during the last admission.   INR down today 1.62, seems to hover around 1.6-1.7. Patient on amiodarone. No bleeding reported.   PTA Coumadin 2.5 mg daily   Goal of Therapy:  INR 2-3 Monitor platelets by anticoagulation protocol: Yes   Plan:  Increase Coumadin to 2.5 mg po x 1 tonight Daily INR, monitor for s/sx of bleeding  Leroy Libman, PharmD Pharmacy Resident Pager: [phone]

## 2016-11-16 NOTE — Plan of Care (Signed)
  RH SKIN INTEGRITY RH STG MAINTAIN SKIN INTEGRITY WITH ASSISTANCE Description STG Maintain Skin Integrity With mod Assistance.  11/16/2016 2131 - Progressing by Edd Arbour, RN   RH SAFETY RH STG ADHERE TO SAFETY PRECAUTIONS W/ASSISTANCE/DEVICE Description STG Adhere to Safety Precautions With min Assistance/Device.  11/16/2016 2131 - Progressing by Edd Arbour, RN   RH PAIN MANAGEMENT RH STG PAIN MANAGED AT OR BELOW PT'S PAIN GOAL Description <4 out of 10.   11/16/2016 2131 - Progressing by Edd Arbour, RN

## 2016-11-16 NOTE — Progress Notes (Signed)
11/16/16 1012 nursing RN offerred to do the ice chips protocol but patient refused claims he just wants to rest.

## 2016-11-16 NOTE — Progress Notes (Signed)
[name] is a 83 y.o. male [birth year] [id number]  Subjective: No new complaints. No new problems. Slept well. Feeling OK.  Objective: Vital signs in last 24 hours: Temp:  [97.8 F (36.6 C)] 97.8 F (36.6 C) (11/04 0527) Pulse Rate:  [75-85] 75 (11/04 0527) Resp:  [18-20] 18 (11/04 0527) BP: (86-103)/(43-50) 103/48 (11/04 0527) SpO2:  [98 %] 98 % (11/04 0527) Weight:  [100.7 kg (222 lb)] 100.7 kg (222 lb) (11/04 0527) Weight change: -3.629 kg () Last BM Date: 11/13/16  Intake/Output from previous day: 11/03 0701 - 11/04 0700 In: 360 [P.O.:360] Out: -   Physical Exam General: No apparent distress    Lungs: Normal effort. Lungs clear to auscultation, no crackles or wheezes. Cardiovascular: Regular rate and rhythm, no edema   Lab Results: BMET    Component Value Date/Time   NA 129 (L) 11/14/2016 1842   K 4.4 11/14/2016 1842   CL 95 (L) 11/14/2016 1842   CO2 24 11/14/2016 1842   GLUCOSE 201 (H) 11/14/2016 1842   BUN 40 (H) 11/14/2016 1842   CREATININE 5.47 (H) 11/14/2016 1842   CALCIUM 8.0 (L) 11/14/2016 1842   GFRNONAA 9 (L) 11/14/2016 1842   GFRAA 10 (L) 11/14/2016 1842   CBC    Component Value Date/Time   WBC 11.9 (H) 11/14/2016 1842   RBC 2.96 (L) 11/14/2016 1842   HGB 8.6 (L) 11/14/2016 1842   HCT 27.5 (L) 11/14/2016 1842   PLT 189 11/14/2016 1842   MCV 92.9 11/14/2016 1842   MCH 29.1 11/14/2016 1842   MCHC 31.3 11/14/2016 1842   RDW 17.7 (H) 11/14/2016 1842   LYMPHSABS 1.3 11/04/2016 0541   MONOABS 0.6 11/04/2016 0541   EOSABS 0.5 11/04/2016 0541   BASOSABS 0.0 11/04/2016 0541   CBG's (last 3):   Recent Labs    11/15/16 1638 11/15/16 2029 11/16/16 0640  GLUCAP 191* 132* 93   LFT's Lab Results  Component Value Date   ALT 23 11/05/2016   AST 17 11/05/2016   ALKPHOS 103 11/05/2016   BILITOT 0.9 11/05/2016    Studies/Results: No results found.  Medications:  I have reviewed the patient's current medications. Scheduled  Medications: . amiodarone  200 mg Oral Q12H  . aspirin  81 mg Per Tube Daily  . atorvastatin  40 mg Oral q1800  . [START ON 11/20/2016] darbepoetin (ARANESP) injection - DIALYSIS  200 mcg Intravenous Q Thu-HD  . diclofenac sodium  2 g Topical QID  . feeding supplement (GLUCERNA SHAKE)  237 mL Oral BID BM  . finasteride  5 mg Oral QHS  . gabapentin  300 mg Oral BID  . Gerhardt's butt cream   Topical BID  . guaiFENesin  600 mg Oral BID  . hydrocortisone cream   Topical BID  . insulin aspart  0-15 Units Subcutaneous TID WC  . insulin detemir  20 Units Subcutaneous Daily  . levothyroxine  25 mcg Per Tube QAC breakfast  . lidocaine  1 patch Transdermal Q24H  . midodrine  10 mg Oral TID WC  . multivitamin  1 tablet Oral QHS  . pantoprazole  40 mg Oral Daily  . predniSONE  5 mg Oral Q breakfast  . tamsulosin  0.4 mg Oral QHS  . warfarin  2.5 mg Oral ONCE-1800  . Warfarin - Pharmacist Dosing Inpatient   Does not apply q1800   PRN Medications: acetaminophen, camphor-menthol, guaiFENesin, ondansetron **OR** ondansetron (ZOFRAN) IV, RESOURCE THICKENUP CLEAR, sorbitol, traMADol, traZODone  Assessment/Plan: Principal Problem:  Debility Active Problems:   Rheumatoid arthritis ([place])   CKD (chronic kidney disease) stage 4, GFR 15-29 ml/min (HCC)   Type 2 diabetes mellitus with peripheral neuropathy (HCC)   PAF (paroxysmal atrial fibrillation) (HCC)   Labile blood glucose   Primary osteoarthritis of right elbow   Length of stay, days: 13  1. Debility following complicated surgery late September.  Continue inpatient rehab PT OT for conditioning as ongoing 2.  Coronary disease status post CABG and Maze procedure September 27.  Continue ongoing cardiac medications.  Stable at this time 3.  Acute kidney injury on hemodialysis Monday Wednesday Friday since surgery.  Volume controlled at this time 4.  Rheumatoid arthritis exacerbation with initiation of steroids November 1.  Continue same 5.   Type 2 diabetes, ischemic control exacerbated by steroids.  Continue ongoing medication with sliding scale as indicated 6.  PAF.  Continue ongoing rate control medications and anticoagulation.  Controlled    [name] A. Asa Lente, MD 11/16/2016, 9:41 AM

## 2016-11-16 NOTE — Progress Notes (Signed)
Physical Therapy Session Note  Patient Details  Name: [name] MRN: [id number] Date of Birth: July 27, 1930  Today's Date: 11/16/2016 PT Individual Time: 1300-1410 PT Individual Time Calculation (min): 70 min   Short Term Goals: Week 2:  PT Short Term Goal 1 (Week 2): =LTGs due to ELOS  Skilled Therapeutic Interventions/Progress Updates:    Pt supine in bed upon PT arrival, agreeable to therapy tx and denies pain. Pt transferred from supine>sitting with min assist to help bring trunk forward while maintaining sternal precautions. Pt performed stand pivot transfer with RW and min assist. Pt transported to gym in w/c total assist. Pt ambulated x 35 ft, 42 ft, x 50 ft and x 40 ft with RW and min assist, on room air pt maintained SpO2 >90% throughout the entire session. Pt used nustep x 10 minutes using LEs only working on cardiovascular endurance and activity tolerance. Pt performed seated therex x 20 LAQ and x20 seated marches for LE strengthening and activity tolerance. Pt performed standing marches in place with RW 2 x 10. Pt performed sit<>stands throughout session with min-mod assist, emphasis on maintaining sternal precautions. Pt left seated in w/c at end of session with daughter present and needs in reached, encouraged pt to sit up in w/c until dinner time at least.    Therapy Documentation Precautions:  Precautions Precautions: Fall, Sternal Restrictions Weight Bearing Restrictions: No RUE Weight Bearing: Weight bearing as tolerated   See Function Navigator for Current Functional Status.   Therapy/Group: Individual Therapy  Netta Corrigan, PT, DPT 11/16/2016, 1:33 PM

## 2016-11-17 ENCOUNTER — Inpatient Hospital Stay (HOSPITAL_COMMUNITY): Payer: Medicare Other

## 2016-11-17 ENCOUNTER — Inpatient Hospital Stay (HOSPITAL_COMMUNITY): Payer: Medicare Other | Admitting: Occupational Therapy

## 2016-11-17 DIAGNOSIS — M79644 Pain in right finger(s): Secondary | ICD-10-CM

## 2016-11-17 LAB — CBC
HEMATOCRIT: 27.3 % — AB (ref 39.0–52.0)
Hemoglobin: 8.6 g/dL — ABNORMAL LOW (ref 13.0–17.0)
MCH: 29.1 pg (ref 26.0–34.0)
MCHC: 31.5 g/dL (ref 30.0–36.0)
MCV: 92.2 fL (ref 78.0–100.0)
Platelets: 170 10*3/uL (ref 150–400)
RBC: 2.96 MIL/uL — ABNORMAL LOW (ref 4.22–5.81)
RDW: 18.8 % — AB (ref 11.5–15.5)
WBC: 13.6 10*3/uL — ABNORMAL HIGH (ref 4.0–10.5)

## 2016-11-17 LAB — RENAL FUNCTION PANEL
ALBUMIN: 2.3 g/dL — AB (ref 3.5–5.0)
Anion gap: 14 (ref 5–15)
BUN: 54 mg/dL — AB (ref 6–20)
CO2: 23 mmol/L (ref 22–32)
Calcium: 8.1 mg/dL — ABNORMAL LOW (ref 8.9–10.3)
Chloride: 95 mmol/L — ABNORMAL LOW (ref 101–111)
Creatinine, Ser: 6.95 mg/dL — ABNORMAL HIGH (ref 0.61–1.24)
GFR calc Af Amer: 7 mL/min — ABNORMAL LOW (ref >60)
GFR calc non Af Amer: 6 mL/min — ABNORMAL LOW (ref >60)
GLUCOSE: 102 mg/dL — AB (ref 65–99)
PHOSPHORUS: 2.7 mg/dL (ref 2.5–4.6)
POTASSIUM: 5.1 mmol/L (ref 3.5–5.1)
Sodium: 132 mmol/L — ABNORMAL LOW (ref 135–145)

## 2016-11-17 LAB — GLUCOSE, CAPILLARY
GLUCOSE-CAPILLARY: 120 mg/dL — AB (ref 65–99)
GLUCOSE-CAPILLARY: 211 mg/dL — AB (ref 65–99)
Glucose-Capillary: 137 mg/dL — ABNORMAL HIGH (ref 65–99)
Glucose-Capillary: 80 mg/dL (ref 65–99)

## 2016-11-17 LAB — PROTIME-INR
INR: 1.8
Prothrombin Time: 20.8 seconds — ABNORMAL HIGH (ref 11.4–15.2)

## 2016-11-17 MED ORDER — WARFARIN SODIUM 5 MG PO TABS
2.5000 mg | ORAL_TABLET | Freq: Once | ORAL | Status: AC
  Administered 2016-11-17: 2.5 mg via ORAL
  Filled 2016-11-17: qty 1

## 2016-11-17 NOTE — Progress Notes (Signed)
[facility] PHYSICAL MEDICINE & REHABILITATION     PROGRESS NOTE  Subjective/Complaints:  Pt seen laying in bed this AM.  He states he slept well overnight and had a good weekend. He is not requiring supplemental oxygen.    ROS: Denies nausea, vomiting, diarrhea, shortness of breath or chest pain   Objective: Vital Signs: Blood pressure (!) 99/48, pulse 76, temperature (!) 97.5 F (36.4 C), temperature source Oral, resp. rate 18, height 5\' 8"  (1.727 m), weight 100.2 kg (221 lb), SpO2 96 %. No results found. Recent Labs    11/14/16 1842  WBC 11.9*  HGB 8.6*  HCT 27.5*  PLT 189   Recent Labs    11/14/16 1842  NA 129*  K 4.4  CL 95*  GLUCOSE 201*  BUN 40*  CREATININE 5.47*  CALCIUM 8.0*   CBG (last 3)  Recent Labs    11/16/16 1649 11/16/16 2033 11/17/16 0631  GLUCAP 151* 165* 120*    Wt Readings from Last 3 Encounters:  11/17/16 100.2 kg (221 lb)  11/03/16 92.6 kg (204 lb 2.3 oz)  10/06/16 99 kg (218 lb 5 oz)    Physical Exam:  BP (!) 99/48 (BP Location: Left Arm)   Pulse 76   Temp (!) 97.5 F (36.4 C) (Oral)   Resp 18   Ht 5\' 8"  (1.727 m)   Wt 100.2 kg (221 lb)   SpO2 96%   BMI 33.60 kg/m  Constitutional: He appears well-developed. NAD. Frail  HENT: Normocephalic and atraumatic.  Eyes: EOM are normal. No discharge.  Cardiovascular: RRR. No JVD. Respiratory: +Clear.  Normal effort  GI: BS +, non-distended   Musculoskeletal: He exhibits no edema. ?+Tinnel's at right elbow Neurological:  Alert and oriented Motor: 4-/5 throughout (upper extremities stronger than lower extremities, unchanged) Skin: Skin is warm and dry. Intact.  Psychiatric: Normal mood and behavior.  Assessment/Plan: 1. Functional deficits secondary to debility which require 3+ hours per day of interdisciplinary therapy in a comprehensive inpatient rehab setting. Physiatrist is providing close team supervision and 24 hour management of active medical problems listed  below. Physiatrist and rehab team continue to assess barriers to discharge/monitor patient progress toward functional and medical goals.  Function:  Bathing Bathing position   Position: Wheelchair/chair at sink  Bathing parts Body parts bathed by patient: Right arm, Left arm, Chest, Abdomen, Left upper leg, Right lower leg, Left lower leg, Right upper leg, Front perineal area Body parts bathed by helper: Buttocks  Bathing assist Assist Level: Touching or steadying assistance(Pt > 75%)      Upper Body Dressing/Undressing Upper body dressing   What is the patient wearing?: Pull over shirt/dress     Pull over shirt/dress - Perfomed by patient: Thread/unthread left sleeve, Put head through opening, Pull shirt over trunk, Thread/unthread right sleeve Pull over shirt/dress - Perfomed by helper: Thread/unthread right sleeve        Upper body assist Assist Level: Supervision or verbal cues      Lower Body Dressing/Undressing Lower body dressing   What is the patient wearing?: Non-skid slipper socks, Ted Hose, Pants     Pants- Performed by patient: Thread/unthread right pants leg, Thread/unthread left pants leg, Pull pants up/down Pants- Performed by helper: Pull pants up/down Non-skid slipper socks- Performed by patient: Don/doff right sock, Don/doff left sock Non-skid slipper socks- Performed by helper: Don/doff right sock, Don/doff left sock               TED Hose - Performed  by helper: Don/doff right TED hose, Don/doff left TED hose  Lower body assist Assist for lower body dressing: Touching or steadying assistance (Pt > 75%)      Toileting Toileting Toileting activity did not occur: No continent bowel/bladder event   Toileting steps completed by helper: Adjust clothing prior to toileting, Performs perineal hygiene, Adjust clothing after toileting Toileting Assistive Devices: Grab bar or rail  Toileting assist Assist level: Two helpers   Transfers Chair/bed transfer    Chair/bed transfer method: Stand pivot, Ambulatory Chair/bed transfer assist level: Touching or steadying assistance (Pt > 75%) Chair/bed transfer assistive device: Medical sales representative Ambulation activity did not occur: Refused   Max distance: 55 ft Assist level: Touching or steadying assistance (Pt > 75%)   Wheelchair Wheelchair activity did not occur: Refused Type: Manual      Cognition Comprehension Comprehension assist level: Follows complex conversation/direction with no assist  Expression Expression assist level: Expresses complex ideas: With extra time/assistive device  Social Interaction Social Interaction assist level: Interacts appropriately with others - No medications needed.  Problem Solving Problem solving assist level: Solves basic 90% of the time/requires cueing < 10% of the time  Memory Memory assist level: Recognizes or recalls 90% of the time/requires cueing < 10% of the time    Medical Problem List and Plan: 1.  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 Patient has sternal precautions  Cont CIR 2.  DVT Prophylaxis/Anticoagulation: Chronic Coumadin therapy for history of PAF   INR Subtherapeutic on 11/5 3. Pain Management: Neurontin 300 mg twice a day, Ultram as needed 4. Mood: Provide emotional support 5. Neuropsych: This patient is capable of making decisions on his own behalf. 6. Skin/Wound Care: Routine skin checks 7. Fluids/Electrolytes/Nutrition: Routine I&Os 8.AKI/CKD stage 3-4. Follow-up renal services. Presently receiving hemodialysis Monday Wednesday Friday schedule monitoring for recovery of renal function.S/P right IJ tunneled dialysis catheter/right brachiocephalic AV fistula 79/02/4095 9. Acute on chronic anemia. Continue iron supplement  Hb 8.6 on 11/2  Cont to monitor 10. Dysphagia. Dysphagia #3 nectar liquids. Monitor hydration.   Advance diet as tolerated 11. Chronic diastolic congestive heart  failure. Monitor for any signs of fluid overload.  Filed Weights   11/15/16 0610 11/16/16 0527 11/17/16 0432  Weight: 101.2 kg (223 lb) 100.7 kg (222 lb) 100.2 kg (221 lb)   Unreliable  CXR reviewed, suggesting CHF  Not requiring supplemental  Oxygen at present, was on supplemental oxygen dependent, 2 L at home. 12. PAF. Chronic Coumadin, Lopressor 12.5 mg twice a day, amiodarone 200 mg every 12 hours, Midodrine 10 mg 3 times a day  Appreciate cards recs, notes reviewed 13. Diabetes mellitus and peripheral neuropathy. Hemoglobin A1c 7.2. Check blood sugars before meals and at bedtime.  Reduced levemir to 20 u daily on 10/28  Will maintain SSI given initiation of steroids  Relatively controlled on 11/5 14. BPH. Proscar 5 mg daily, Flomax 0.4 mg daily 15. Hyperlipidemia. Lipitor 16. Hypothyroidism. Synthroid 17.  Hypotension   See  above  Related to HD.  Fluid management per nephro. 18.  RA  Monthly infusion PTA.    Discussed with pharmacy home medication/dosage, likely on orencia, not due until later this month  Voltaren GEL ordered with improvement  Lidoderm patch also ordered on 10/31  Prednisone 5mg  started on 11/1, improving 19. Leukocytosis  WBCs 11.9 on 11/2, labs with HD  Repeat CXR reviewed, slight improvement  Continue to monitor  Afebrile  20. 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

## 2016-11-17 NOTE — Progress Notes (Signed)
Speech Language Pathology Daily Session Note  Patient Details  Name: [name] MRN: [id number] Date of Birth: 1930/08/07  Today's Date: 11/17/2016 SLP Individual Time: 1118-1200 SLP Individual Time Calculation (min): 42 min  Short Term Goals: Week 2: SLP Short Term Goal 1 (Week 2): Pt will consume trials of ice chips with minimal overt s/s of aspiration and supervision cues for use of swallowing precautions over 3 consecutive sessions.   SLP Short Term Goal 2 (Week 2): Pt will utilize external aids to facilitate recall of complex information with supervision verbal cues.   SLP Short Term Goal 3 (Week 2): Pt will complete 50 repetitions of EMST with mod I and a self perceived effort level of <7/10 over 3 consecutive sessions.   SLP Short Term Goal 4 (Week 2): Pt will consume dys 3 textures and nectar thick liquids with supervision use of swallowing precautions and minimal overt s/s of aspiration SLP Short Term Goal 5 (Week 2): Pt will complete 50 effortful swallows in a session with mod I.    Skilled Therapeutic Interventions: Skilled ST services focused on swallow and respiratory skills. SLP facilitated EMST to increase breath support, pt demonstrated 50 repetition throughout session with self perceived score of 8/10 given supervision cues to aid in tracking repetitions. SLP facilitated effortful swallow with and without NTL with a total of 15 repetitions with supervision verbal cues, pt did require Min A verbal cues fading to supervision A verbal cues to utilize swallow strategies with NTL. Pt requested follow up MBS. SLP will plan follow up MBS before discharge and continue phyarngeal and respiratory strength exercises. Pt was left in room with nursing staff present.       Function:  Eating Eating   Modified Consistency Diet: Yes Eating Assist Level: More than reasonable amount of time;Set up assist for;Supervision or verbal cues   Eating Set Up Assist For: Opening containers        Cognition Comprehension Comprehension assist level: Follows complex conversation/direction with no assist  Expression   Expression assist level: Expresses complex ideas: With no assist  Social Interaction Social Interaction assist level: Interacts appropriately with others - No medications needed.  Problem Solving Problem solving assist level: Solves basic 90% of the time/requires cueing < 10% of the time  Memory Memory assist level: Recognizes or recalls 90% of the time/requires cueing < 10% of the time    Pain Pain Assessment Pain Assessment: No/denies pain  Therapy/Group: Individual Therapy  [name]  [facility] 11/17/2016, 12:11 PM

## 2016-11-17 NOTE — Progress Notes (Signed)
Pt arrived on unit in wheelchair; assessed, vitals and CBG collected. Pt denies pain or discomfort except for arthritic pain to R elbow and R wrist; will continue to monitor  Jireh Vinas Dawna Part, RN

## 2016-11-17 NOTE — Progress Notes (Signed)
Physical Therapy Session Note  Patient Details  Name: [name] MRN: [id number] Date of Birth: 1930-07-16  Today's Date: 11/17/2016 PT Individual Time: 0800-0900, 1445-1515  PT Individual Time Calculation (min): 60 min, 30 min  Short Term Goals: Week 2:  PT Short Term Goal 1 (Week 2): =LTGs due to ELOS  Skilled Therapeutic Interventions/Progress Updates:    Session 1: Pt seated in bed eating breakfast upon PT arrival, agreeable to therapy tx and denies pain. Pt transferred from bed>w/c with min assist, stand pivot using RW. Pt transported to gym in w/c total assist. Pt ambulated x 45 ft, x 50 ft and x 66 ft using RW and min assist working on activity tolerance and cardiovascular endurance, SpO2 maintained >94% on room air with verbal cues for breathing technique during ambulation. Pt performed 2 x 10 LAQ and 2 x 10 seated marches with 3lb ankle weights for LE strengthening. Pt performed 2 x 10 toe taps on 4 inch step, RW for UE support, working on dynamic balance and standing activity tolerance with Spo2 >95%. Pt worked on dynamic balance and standing tolerance in order to complete card matching activity. Pt transported back to room in w/c, left seated in w/c with needs in reach.    Session 2:  Pt supine in bed upon PT arrival, agreeable to therapy tx and denies pain.  Pt transferred from bed>w/c with min assist, stand pivot using RW. Pt transported to gym in w/c total assist. Pt ambulated x 35 ft, x 50 ft and x 60 ft using RW and min assist working on activity tolerance and cardiovascular endurance, SpO2 maintained >94% on room air with verbal cues for breathing technique during ambulation. Pt performed x 3 sit<>stands without UE support and min assist, verbal cues for technique, working on LE strengthening. Pt left seated in w/c at end of session with needs in reach.  Therapy Documentation Precautions:  Precautions Precautions: Fall, Sternal Restrictions Weight Bearing Restrictions:  No RUE Weight Bearing: Weight bearing as tolerated   See Function Navigator for Current Functional Status.   Therapy/Group: Individual Therapy  Netta Corrigan, PT, DPT 11/17/2016, 8:04 AM

## 2016-11-17 NOTE — Progress Notes (Signed)
HD tx initiated via HD cath w/o problem, AP pull sluggish push/flush well, VP pull/push/flush well, VSS but w/ soft bp, made Dr. Jimmy Footman aware of pre tx bp of 93/48 and requested an additional dose of midodrine for tx, he gave order to keep pt's sbp>85, will cont to monitor while on HD tx

## 2016-11-17 NOTE — Progress Notes (Signed)
ANTICOAGULATION CONSULT NOTE - Initial Consult  Pharmacy Consult for Warfarin Indication: atrial fibrillation  No Known Allergies  Patient Measurements: Height: 5\' 8"  (172.7 cm) Weight: 221 lb (100.2 kg) IBW/kg (Calculated) : 68.4  Vital Signs: Temp: 97.5 F (36.4 C) (11/05 0432) Temp Source: Oral (11/05 0432) BP: 99/48 (11/05 0432) Pulse Rate: 76 (11/05 0432)  Labs: Recent Labs    11/14/16 1842 11/15/16 0604 11/16/16 0610 11/17/16 0640  HGB 8.6*  --   --   --   HCT 27.5*  --   --   --   PLT 189  --   --   --   LABPROT  --  20.7* 19.1* 20.8*  INR  --  1.79 1.62 1.80  CREATININE 5.47*  --   --   --     Estimated Creatinine Clearance: 11.5 mL/min (A) (by C-G formula based on SCr of 5.47 mg/dL (H)).   Assessment: 43 yoM transferred to CIR on 10/22 after a prolonged hospital stay that included CABG/MAZE with AKI and new ESRD. He has a h/o afib on apixaban PTA, which was switched to warfarin during the last admission.   INR 1.8 sl subtherapeutic, continue on amio, po intake slowly improving  Goal of Therapy:  INR 2-3 Monitor platelets by anticoagulation protocol: Yes   Plan:  Coumadin 2.5 mg po x 1 tonight (labile INR, will not do >2.5mg /day) Daily INR, monitor for s/sx of bleeding  Maryanna Shape, PharmD, BCPS  Clinical Pharmacist  Pager: [phone]  11/17/2016 1:25 PM

## 2016-11-17 NOTE — Progress Notes (Signed)
HD tx completed @ 8295 w/ bp issues throughout despite interventions, UF goal was changed to keep even per MD order, kept even per MD order, blood rinsed back, VSS but w/ soft bp, report called to Renato Gails, RN

## 2016-11-17 NOTE — Progress Notes (Signed)
Occupational Therapy Session Note  Patient Details  Name: [name] MRN: [id number] Date of Birth: April 18, 1930  Today's Date: 11/17/2016 OT Individual Time: 1002-1101 OT Individual Time Calculation (min): 59 min    Short Term Goals: Week 2:  OT Short Term Goal 1 (Week 2): Continue working on established supervision level goals for discharge.  Skilled Therapeutic Interventions/Progress Updates:    Pt up in wheelchair to start session.  Worked on completion of LB bathing and dressing to start session.  Pt able to complete all LB bathing except reaching buttocks, which required max assist.  He donned all LB clothing except for TEDs and was able to complete multiple sit to stand transitions with min assist and min instructional cueing for hand placement.  Once dressing was completed, transported pt down to the therapy gym where he worked on sit to stand and static standing balance from variable heights.  He was able to complete all sit to stand transitions from the mat with use of the RW and min guard assist.  Once standing he demonstrated the need for min assist secondary to frequent LOB posteriorly when attempting head movements or simple reaching with UEs.  Pt only able to stand for 1 minute or less before fatiguing and sitting down.  Oxygen sats at 98% on room air.  Returned to wheelchair from mat and pt was then transported back to the room via wheelchair.  Pt still needed mod demonstrational cueing for transition from standing to sitting as he does not get squared up to the surface and did not reach back with one UE.  Pt left at bedside with call button and phone in reach.    Therapy Documentation Precautions:  Precautions Precautions: Fall, Sternal Restrictions Weight Bearing Restrictions: No RUE Weight Bearing: Weight bearing as tolerated Pain: Pain Assessment Pain Assessment: No/denies pain ADL: See Function Navigator for Current Functional Status.   Therapy/Group: Individual  Therapy  Renesmae Donahey OTR/L 11/17/2016, 12:42 PM

## 2016-11-17 NOTE — Progress Notes (Signed)
[name] Progress Note   Assessment/ Plan:   1 ESRD-  Patient is schedule for HD today and tomorrow. 2K bath.  Trying UF profile 4 to augment fluid removal, 35 C temp. ContinueFluid restriction to 1200 ml a day. BP continue to below, will closely monitor with HD, on midodrine tid. TTS schedule as OP, has been CLIP'd to [facility].  2 Hypertension/ volume:  This am 99/48. Currently on midordrine midodrine 10 mg TID.   3 CAD- s/p CABG/Maze.  On warfarin  4 Deconditioning and debility- in CIR  5 DM- per primary  6 Anemia- last Hgb 8.6 (11/2), on Aranesp 200 mcg q Wednesday and ferrlicet 983 mcg x 8 doses, started 10/26  7 Bone/ mineral: Phos 6.5, add velphoro 500 mg TID with meals, calcitriol   8.  L hand numbness--> 3rd-5th fingers of R hand.  Hand well-perfused, Ulnar neuropathy vs mild steal. --Consider VVS consult, for now will continue to monitor.  9 Dispo: 11/9 is d/c date    Subjective:    Patient is doing well this morning. No acute complaints. Was sitting in wheel chair getting ready to start therapy.   Objective:   BP (!) 99/48 (BP Location: Left Arm)   Pulse 76   Temp (!) 97.5 F (36.4 C) (Oral)   Resp 18   Ht 5\' 8"  (1.727 m)   Wt 221 lb (100.2 kg)   SpO2 96%   BMI 33.60 kg/m   Physical Exam: General appearance: NAD Resp: Rhonchi heard bilaterally upper and lower lung fields Chest wall: RIJ tunneled HD cath Cardio: regular rate and rhythm, S1, S2 normal  GI: mild distension, decreased bs, nontender Extremities: 1+ edema, AVF RUA with +T/B, R hand well-perfused.  Peeling skin bilateral hands.  Labs: BMET Recent Labs  Lab 11/10/16 1630 11/12/16 1456 11/14/16 1842  NA 131* 131* 129*  K 4.5 4.2 4.4  CL 92* 94* 95*  CO2 26 26 24   GLUCOSE 93 80 201*  BUN 47* 34* 40*  CREATININE 7.31* 5.87* 5.47*  CALCIUM 8.1* 8.0* 8.0*  PHOS 6.5* 3.6 2.9   CBC Recent Labs  Lab 11/10/16 1630 11/12/16 1456 11/14/16 1842  WBC 12.0* 13.8* 11.9*   HGB 8.8* 8.4* 8.6*  HCT 28.1* 26.6* 27.5*  MCV 91.5 92.4 92.9  PLT 318 257 189    @IMGRELPRIORS @ Medications:    . amiodarone  200 mg Oral Q12H  . aspirin  81 mg Per Tube Daily  . atorvastatin  40 mg Oral q1800  . [START ON 11/20/2016] darbepoetin (ARANESP) injection - DIALYSIS  200 mcg Intravenous Q Thu-HD  . diclofenac sodium  2 g Topical QID  . feeding supplement (GLUCERNA SHAKE)  237 mL Oral BID BM  . finasteride  5 mg Oral QHS  . gabapentin  300 mg Oral BID  . Gerhardt's butt cream   Topical BID  . guaiFENesin  600 mg Oral BID  . hydrocortisone cream   Topical BID  . insulin aspart  0-15 Units Subcutaneous TID WC  . insulin detemir  20 Units Subcutaneous Daily  . levothyroxine  25 mcg Per Tube QAC breakfast  . lidocaine  1 patch Transdermal Q24H  . midodrine  10 mg Oral TID WC  . multivitamin  1 tablet Oral QHS  . pantoprazole  40 mg Oral Daily  . predniSONE  5 mg Oral Q breakfast  . tamsulosin  0.4 mg Oral QHS  . Warfarin - Pharmacist Dosing Inpatient   Does not apply  [place], MD [place], PGY-2  11/17/2016, 10:20 AM

## 2016-11-18 ENCOUNTER — Inpatient Hospital Stay (HOSPITAL_COMMUNITY): Payer: Medicare Other | Admitting: Occupational Therapy

## 2016-11-18 ENCOUNTER — Inpatient Hospital Stay (HOSPITAL_COMMUNITY): Payer: Medicare Other

## 2016-11-18 ENCOUNTER — Inpatient Hospital Stay (HOSPITAL_COMMUNITY): Payer: Medicare Other | Admitting: Physical Therapy

## 2016-11-18 DIAGNOSIS — M79631 Pain in right forearm: Secondary | ICD-10-CM

## 2016-11-18 DIAGNOSIS — B369 Superficial mycosis, unspecified: Secondary | ICD-10-CM

## 2016-11-18 DIAGNOSIS — Z992 Dependence on renal dialysis: Secondary | ICD-10-CM

## 2016-11-18 LAB — PROTIME-INR
INR: 1.86
PROTHROMBIN TIME: 21.3 s — AB (ref 11.4–15.2)

## 2016-11-18 LAB — GLUCOSE, CAPILLARY
GLUCOSE-CAPILLARY: 61 mg/dL — AB (ref 65–99)
GLUCOSE-CAPILLARY: 65 mg/dL (ref 65–99)
Glucose-Capillary: 151 mg/dL — ABNORMAL HIGH (ref 65–99)
Glucose-Capillary: 101 mg/dL — ABNORMAL HIGH (ref 65–99)
Glucose-Capillary: 119 mg/dL — ABNORMAL HIGH (ref 65–99)
Glucose-Capillary: 122 mg/dL — ABNORMAL HIGH (ref 65–99)

## 2016-11-18 MED ORDER — NYSTATIN 100000 UNIT/GM EX CREA
TOPICAL_CREAM | Freq: Two times a day (BID) | CUTANEOUS | Status: DC
  Administered 2016-11-18 – 2016-11-20 (×4): via TOPICAL
  Filled 2016-11-18: qty 15

## 2016-11-18 MED ORDER — WARFARIN SODIUM 5 MG PO TABS
2.5000 mg | ORAL_TABLET | Freq: Once | ORAL | Status: AC
  Administered 2016-11-18: 2.5 mg via ORAL
  Filled 2016-11-18: qty 1

## 2016-11-18 NOTE — Progress Notes (Signed)
Physical Therapy Session Note  Patient Details  Name: [name] MRN: [id number] Date of Birth: 07/16/1930  Today's Date: 11/18/2016 PT Individual Time: 0805-0915 PT Individual Time Calculation (min): 70 min   Short Term Goals: Week 2:  PT Short Term Goal 1 (Week 2): =LTGs due to ELOS  Skilled Therapeutic Interventions/Progress Updates:    Pt supine in bed upon PT arrival, agreeable to therapy tx and denies pain. Pt transferred from supine to sitting EOB with min assist in order to maintain sternal precautions, sitting EOB in order to peel and eat hard boiled eggs x 8 minutes while working on seated balance and upright posture. Pt transferred from bed>w/c with min assist, stand pivot transfer using RW. Pt transported to dayroom in w/c total assist. Pt ambulated x 60 ft, 40 ft and x 20 ft with SpO2 at 89% after ambulation, returning to 98% with seated rest break and verbal cues for breathing technique. Pt used nustep using LEs only 2 x 5 minutes working on cardiovascular endurance with a rest break between secondary to SOB, SpO2 >95%. Pt performed 2 x 5 sit<>stands without UE support from 24 inch surface working on LE strengthening and activity tolerance, supervision to min assist. Pt stands from w/c throughout session at 21 inch height with min assist without UE support to maintain sternal precautions. Pt standing at high table completed peg board puzzle working on standing balance and standing activity tolerance.    Therapy Documentation Precautions:  Precautions Precautions: Fall, Sternal Restrictions Weight Bearing Restrictions: No RUE Weight Bearing: Weight bearing as tolerated   See Function Navigator for Current Functional Status.   Therapy/Group: Individual Therapy  Netta Corrigan, PT, DPT 11/18/2016, 7:43 AM

## 2016-11-18 NOTE — Progress Notes (Addendum)
[facility]     VASCULAR SURGERY ASSESSMENT & PLAN:   This pt had a right BC AVF on 10/28/16. Prior to that he had attempted heart cath via right radial artery. After heart cath he began having pain on the radial aspect of the right forearm and paresthesias and pain in the ring, middle, and index fingers. He has a normal right ulnar and palmar arch signal with the doppler. The radial signal is dampened. The pain sounds neurogenic. Given the location of the incision, it is likely not related to the incision at the antecubital level. This may be related to the previous attempted cath via right radial artery. Would follow. I do not think that ligation of the AVF would help his symptoms. Vascular surgery will be available as needed. I have asked him to keep his regularly scheduled apt later this month.   [name], MD, FACS Beeper [phone] Office: [phone]   HPI: 83 y/o male who recently had a right IJ [facility] and right brachiocephalic AV fistula placed by Dr. Scot Dock on 10/28/2016.  He is reporting numbness and pain in the forearm and hand digits 3-5th.  We have been asked to access his av fistula and right UE for steal.  Subjective  - He states he can't stand the pain.  He has a Lidoderm patch over the forearm to try and help the pain.  He states it gets worse when he is on HD via his right IJ.  Objective (!) 90/51 99 97.9 F (36.6 C) (Oral) 14 100%  Intake/Output Summary (Last 24 hours) at 11/18/2016 1157 Last data filed at 11/18/2016 0832 Gross per 24 hour  Intake 460 ml  Output 0 ml  Net 460 ml    Grip 4/5 right hand, doppler radial and ulnar signals, non palpable pulse.   No wounds or cyanosis on the right hand.      Laurence Slate [facility] 11/18/2016 11:57 AM

## 2016-11-18 NOTE — Progress Notes (Signed)
Physical Therapy Session Note  Patient Details  Name: [name] MRN: [id number] Date of Birth: 30-Oct-1930  Today's Date: 11/18/2016 PT Individual Time: 1500-1525 PT Individual Time Calculation (min): 25 min   Short Term Goals: Week 2:  PT Short Term Goal 1 (Week 2): =LTGs due to ELOS  Skilled Therapeutic Interventions/Progress Updates:   Pt supine upon arrival and agreeable to therapy, no c/o pain. Worked on endurance and breathing techniques during gait this session. Ambulated 50' x2 w/ seated rest in between bouts 2/2 fatigue and moderate increase in work of breathing. On room air and no signs or symptoms of O2 desat. Verbal cues for pursed lip breathing. Returned to sitting EOB and worked on catching breath prior to returning to supine. Ended session in supine and head of bed elevated. In care of family and all needs met.   Therapy Documentation Precautions:  Precautions Precautions: Fall, Sternal Restrictions Weight Bearing Restrictions: No RUE Weight Bearing: Weight bearing as tolerated Vital Signs: Therapy Vitals Temp: 98.4 F (36.9 C) Temp Source: Oral Pulse Rate: (!) 102 Resp: 18 BP: (!) 98/48(rn notified) Patient Position (if appropriate): Lying Oxygen Therapy SpO2: 93 % O2 Device: Not Delivered  See Function Navigator for Current Functional Status.   Therapy/Group: Individual Therapy  Shirell Struthers K Arnette 11/18/2016, 3:24 PM

## 2016-11-18 NOTE — Consult Note (Signed)
WOC consulted for MASD vs pressure injury.  Noted [place] nurse saw patient 10/29 for consultation for same. Verified with bedside nurse if there were acute changes, she reports no that they will follow previous recommendations.    Re consult if needed, will not follow at this time. Thanks  Gen Clagg [employer], RN,CWOCN, CNS, [place] [phone])

## 2016-11-18 NOTE — Progress Notes (Signed)
Pt was sent back to unit because he was unable to tolerate dialysis d/t unstable BP and EKG readings of A-Fib per dialysis RN report. Per report pt was asymptomatic and MD [place] notified of dialysis issues; pt was continually monitored over night  Shirelle Tootle Dawna Part, RN

## 2016-11-18 NOTE — Progress Notes (Signed)
Pt remains asymptomatic; denies pain except for arthritic pain to R wrist and R elbow; fluids offered; will continue to monitor  Annita Brod, RN 11/18/2016

## 2016-11-18 NOTE — Progress Notes (Signed)
[place] PHYSICAL MEDICINE & REHABILITATION     PROGRESS NOTE  Subjective/Complaints:  Pt seen laying in bed this AM.  He states he slept fairly overnight and complains about right arm pain again.  Family noted that this started after fistula placement and patient notes it had resolved until he had HD again.    ROS: +Right forearm pain. Denies nausea, vomiting, diarrhea, shortness of breath or chest pain   Objective: Vital Signs: Blood pressure (!) 90/51, pulse 99, temperature 97.9 F (36.6 C), temperature source Oral, resp. rate 14, height 5\' 8"  (1.727 m), weight 99.3 kg (219 lb), SpO2 100 %. No results found. Recent Labs    11/17/16 2000  WBC 13.6*  HGB 8.6*  HCT 27.3*  PLT 170   Recent Labs    11/17/16 2000  NA 132*  K 5.1  CL 95*  GLUCOSE 102*  BUN 54*  CREATININE 6.95*  CALCIUM 8.1*   CBG (last 3)  Recent Labs    11/17/16 1635 11/17/16 2301 11/18/16 0616  GLUCAP 211* 80 151*    Wt Readings from Last 3 Encounters:  11/17/16 99.3 kg (219 lb)  11/03/16 92.6 kg (204 lb 2.3 oz)  10/06/16 99 kg (218 lb 5 oz)    Physical Exam:  BP (!) 90/51 (BP Location: Left Arm)   Pulse 99   Temp 97.9 F (36.6 C) (Oral)   Resp 14   Ht 5\' 8"  (1.727 m)   Wt 99.3 kg (219 lb)   SpO2 100%   BMI 33.30 kg/m  Constitutional: He appears well-developed. NAD. Frail  HENT: Normocephalic and atraumatic.  Eyes: EOM are normal. No discharge.  Cardiovascular: RRR. No JVD. Respiratory: +Clear.  Normal effort  GI: BS +, non-distended   Musculoskeletal: He exhibits no edema. +TTP right forearm. Neurological:  Alert and oriented Motor: 4-/5 throughout (upper extremities stronger than lower extremities, unchanged) Skin: Skin is warm and dry. Small Open ulcer left buttock. Fungal rash sacral area Psychiatric: Normal mood and behavior.  Assessment/Plan: 1. Functional deficits secondary to debility which require 3+ hours per day of interdisciplinary therapy in a comprehensive  inpatient rehab setting. Physiatrist is providing close team supervision and 24 hour management of active medical problems listed below. Physiatrist and rehab team continue to assess barriers to discharge/monitor patient progress toward functional and medical goals.  Function:  Bathing Bathing position   Position: Wheelchair/chair at sink  Bathing parts Body parts bathed by patient: Front perineal area, Right upper leg, Left upper leg, Right lower leg, Left lower leg Body parts bathed by helper: Buttocks  Bathing assist Assist Level: Touching or steadying assistance(Pt > 75%)      Upper Body Dressing/Undressing Upper body dressing   What is the patient wearing?: Pull over shirt/dress     Pull over shirt/dress - Perfomed by patient: Thread/unthread left sleeve, Put head through opening, Pull shirt over trunk, Thread/unthread right sleeve Pull over shirt/dress - Perfomed by helper: Thread/unthread right sleeve        Upper body assist Assist Level: Supervision or verbal cues      Lower Body Dressing/Undressing Lower body dressing   What is the patient wearing?: Non-skid slipper socks, Ted Hose, Pants     Pants- Performed by patient: Thread/unthread right pants leg, Thread/unthread left pants leg, Pull pants up/down Pants- Performed by helper: Pull pants up/down Non-skid slipper socks- Performed by patient: Don/doff right sock, Don/doff left sock Non-skid slipper socks- Performed by helper: Don/doff right sock, Don/doff left sock  TED Hose - Performed by helper: Don/doff right TED hose, Don/doff left TED hose  Lower body assist Assist for lower body dressing: Touching or steadying assistance (Pt > 75%)      Toileting Toileting Toileting activity did not occur: No continent bowel/bladder event   Toileting steps completed by helper: Adjust clothing prior to toileting, Performs perineal hygiene, Adjust clothing after toileting Toileting Assistive Devices:  Grab bar or rail  Toileting assist Assist level: Two helpers   Transfers Chair/bed transfer   Chair/bed transfer method: Stand pivot, Ambulatory Chair/bed transfer assist level: Touching or steadying assistance (Pt > 75%) Chair/bed transfer assistive device: Medical sales representative Ambulation activity did not occur: Refused   Max distance: 65 ft Assist level: Touching or steadying assistance (Pt > 75%)   Wheelchair Wheelchair activity did not occur: Refused Type: Manual      Cognition Comprehension Comprehension assist level: Follows complex conversation/direction with no assist  Expression Expression assist level: Expresses complex ideas: With no assist  Social Interaction Social Interaction assist level: Interacts appropriately with others - No medications needed.  Problem Solving Problem solving assist level: Solves basic 90% of the time/requires cueing < 10% of the time  Memory Memory assist level: Recognizes or recalls 75 - 89% of the time/requires cueing 10 - 24% of the time    Medical Problem List and Plan: 1.  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 Patient has sternal precautions  Cont CIR 2.  DVT Prophylaxis/Anticoagulation: Chronic Coumadin therapy for history of PAF   INR Subtherapeutic on 11/6 3. Pain Management: Neurontin 300 mg twice a day, Ultram as needed 4. Mood: Provide emotional support 5. Neuropsych: This patient is capable of making decisions on his own behalf. 6. Skin/Wound Care: Routine skin checks  Nystatin cream ordered for fungal rash. 7. Fluids/Electrolytes/Nutrition: Routine I&Os 8.AKI/CKD stage 3-4. Follow-up renal services. Presently receiving hemodialysis Monday Wednesday Friday schedule monitoring for recovery of renal function.S/P right IJ tunneled dialysis catheter/right brachiocephalic AV fistula 56/43/3295  Appreciate Neprho recs, cont HD per Nephro, not able to tolerate full session of HD yesterday 9.  Acute on chronic anemia. Continue iron supplement  Hb 8.6 on 11/5  Cont to monitor 10. Dysphagia. Dysphagia #3 nectar liquids. Monitor hydration.   Advance diet as tolerated 11. Chronic diastolic congestive heart failure. Monitor for any signs of fluid overload.  Filed Weights   11/17/16 1930 11/17/16 2247 11/17/16 2300  Weight: 92.1 kg (203 lb 0.7 oz) 92.1 kg (203 lb 0.7 oz) 99.3 kg (219 lb)   Unreliable  CXR reviewed, suggesting CHF  Not requiring supplemental Oxygen anymore, despite requiring 2 L PTA 12. PAF. Chronic Coumadin, Lopressor 12.5 mg twice a day, amiodarone 200 mg every 12 hours, Midodrine 10 mg 3 times a day  Appreciate cards recs, notes reviewed 13. Diabetes mellitus and peripheral neuropathy. Hemoglobin A1c 7.2. Check blood sugars before meals and at bedtime.  Reduced levemir to 20 u daily on 10/28  Will maintain SSI given initiation of steroids  Relatively controlled on 11/6 14. BPH. Proscar 5 mg daily, Flomax 0.4 mg daily 15. Hyperlipidemia. Lipitor 16. Hypothyroidism. Synthroid 17.  Hypotension   See  above  Related to HD.  Fluid management per nephro. 18.  RA  Monthly infusion PTA.    Discussed with pharmacy home medication/dosage, likely on orencia, not due until later this month  Voltaren GEL ordered with improvement  Lidoderm patch also ordered on 10/31  Prednisone 5mg  started on 11/1 19. Leukocytosis,  persistent  WBCs 13.6 on 11/5, labs with HD  Repeat CXR reviewed, slight improvement  Continue to monitor  Afebrile  20. 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    LOS (Days) 15 A FACE TO FACE EVALUATION WAS PERFORMED  Ankit Lorie Phenix 11/18/2016 8:11 AM

## 2016-11-18 NOTE — Progress Notes (Signed)
[facility] Progress Note   Assessment/ Plan:   1 ESRD-  Patient had HD (3 hours) yesterday despite low BP pre HD and received one extra dose of midodrine. UF goal were adjusted given hypotension and patient was kept even. Continue fluid restriction to 1200 ml a day. BP continue to be low 90/51 today. Will continue to closely monitor with HD and continue with midodrine tid. TTS schedule as OP, has been CLIP'd to [facility]. Patient will likely need adjustment in midodrine dosage in the outpatient setting.  2 Hypertension/ volume:  This am 90/51. Currently on midordrine midodrine 10 mg TID.   3 CAD- s/p CABG/Maze/Afib  On warfarin. INR 1.86 and still subtherapeutic.  Continue management per pharmacy. Goal 2-3. Last platelet count 170 on 11/5 --Follow up on am CBC and BMP --Continue amiodarone.  4 Deconditioning and debility- in CIR. Patient is tolerating therapy and progression has been appropriate.  5 DM- Per primary  6 Anemia- Last Hgb 8.6 (11/2), on Aranesp 200 mcg q Wednesday and ferrlicet 989 mcg x 8 doses, started 10/26. --Follow up on am CBC  7 Bone/ mineral: Phos 6.5, add velphoro 500 mg TID with meals, calcitriol   8.  L hand numbness--> 3rd-5th fingers of R hand.  Hand well-perfused, Ulnar neuropathy vs mild steal. --Consider VVS consult, for now will continue to monitor.  9 Dispo: Anticipate discharge from CIR on 11/9.    Subjective:    Patient is doing well this morning. No acute complaints. Working through therapy with occupational therapist.   Objective:   BP (!) 90/51 (BP Location: Left Arm)   Pulse 99   Temp 97.9 F (36.6 C) (Oral)   Resp 14   Ht 5\' 8"  (1.727 m)   Wt 219 lb (99.3 kg)   SpO2 100%   BMI 33.30 kg/m   Physical Exam: General appearance: NAD Resp: Rhonchi heard bilaterally upper and lower lung fields Chest wall: RIJ tunneled HD cath Cardio: regular rate and rhythm, S1, S2 normal  GI: mild distension, decreased bs,  nontender Extremities: 1+ edema, AVF RUA with +T/B, R hand well-perfused.  Peeling skin bilateral hands.  Labs: BMET Recent Labs  Lab 11/12/16 1456 11/14/16 1842 11/17/16 2000  NA 131* 129* 132*  K 4.2 4.4 5.1  CL 94* 95* 95*  CO2 26 24 23   GLUCOSE 80 201* 102*  BUN 34* 40* 54*  CREATININE 5.87* 5.47* 6.95*  CALCIUM 8.0* 8.0* 8.1*  PHOS 3.6 2.9 2.7   CBC Recent Labs  Lab 11/12/16 1456 11/14/16 1842 11/17/16 2000  WBC 13.8* 11.9* 13.6*  HGB 8.4* 8.6* 8.6*  HCT 26.6* 27.5* 27.3*  MCV 92.4 92.9 92.2  PLT 257 189 170    @IMGRELPRIORS @ Medications:    . amiodarone  200 mg Oral Q12H  . aspirin  81 mg Per Tube Daily  . atorvastatin  40 mg Oral q1800  . [START ON 11/20/2016] darbepoetin (ARANESP) injection - DIALYSIS  200 mcg Intravenous Q Thu-HD  . diclofenac sodium  2 g Topical QID  . feeding supplement (GLUCERNA SHAKE)  237 mL Oral BID BM  . finasteride  5 mg Oral QHS  . gabapentin  300 mg Oral BID  . Gerhardt's butt cream   Topical BID  . guaiFENesin  600 mg Oral BID  . hydrocortisone cream   Topical BID  . insulin aspart  0-15 Units Subcutaneous TID WC  . insulin detemir  20 Units Subcutaneous Daily  . levothyroxine  25 mcg Per  Tube QAC breakfast  . lidocaine  1 patch Transdermal Q24H  . midodrine  10 mg Oral TID WC  . multivitamin  1 tablet Oral QHS  . nystatin cream   Topical BID  . pantoprazole  40 mg Oral Daily  . predniSONE  5 mg Oral Q breakfast  . tamsulosin  0.4 mg Oral QHS  . Warfarin - Pharmacist Dosing Inpatient   Does not apply [place], MD [place], PGY-2  11/18/2016, 10:38 AM

## 2016-11-18 NOTE — Progress Notes (Signed)
Speech Language Pathology Weekly Progress and Session Note  Patient Details  Name: [name] MRN: [id number] Date of Birth: [birth year]  Beginning of progress report period: November 12, 2016 End of progress report period: November 18, 2016  Today's Date: 11/18/2016 SLP Individual Time: 1410-1443 SLP Individual Time Calculation (min): 33 min  Short Term Goals: Week 2: SLP Short Term Goal 1 (Week 2): Pt will consume trials of ice chips with minimal overt s/s of aspiration and supervision cues for use of swallowing precautions over 3 consecutive sessions.   SLP Short Term Goal 1 - Progress (Week 2): Not met SLP Short Term Goal 2 (Week 2): Pt will utilize external aids to facilitate recall of complex information with supervision verbal cues.   SLP Short Term Goal 2 - Progress (Week 2): Met SLP Short Term Goal 3 (Week 2): Pt will complete 50 repetitions of EMST with mod I and a self perceived effort level of <7/10 over 3 consecutive sessions.   SLP Short Term Goal 3 - Progress (Week 2): Met SLP Short Term Goal 4 (Week 2): Pt will consume dys 3 textures and nectar thick liquids with supervision use of swallowing precautions and minimal overt s/s of aspiration SLP Short Term Goal 4 - Progress (Week 2): Met SLP Short Term Goal 5 (Week 2): Pt will complete 50 effortful swallows in a session with mod I.   SLP Short Term Goal 5 - Progress (Week 2): Not met    New Short Term Goals: Week 3: SLP Short Term Goal 1 (Week 3): STG=LTG due to ELOS  Weekly Progress Updates:    Pt has made slow but functional gains this reporting period and has met 3 out of 5 short term goals. Swallowing function remains limited by debility and compromised respiratory status.Pt will continue respiratory and pharyngeal exercises to increase swallow function in order to demonstrate readiness of MBS and recall/utilzing current swallow strategies towards supervision level.  Pt would continue to benefit from skilled ST while  inpatient in order to maximize functional independence and reduce burden of care prior to discharge. Ordered follow-up MBS before discharge. Anticipate that pt will need 24/7 supervision at discharge in addition to [place] follow up at next level of care.  Pt and family education is ongoing.    Intensity: Minumum of 1-2 x/day, 30 to 90 minutes Frequency: 3 to 5 out of 7 days Duration/Length of Stay: 11/9 Treatment/Interventions: Cognitive remediation/compensation;Cueing hierarchy;Functional tasks;Dysphagia/aspiration precaution training;Internal/external aids;Patient/family education   Daily Session  Skilled Therapeutic Interventions: Skilled ST services focused on swallow skills and family education. Family members were present for session,wife and daughter, SLP reviewed progress, and current goals with pt permission and plan for MBS before discharge. SLP facilitated oral care with sit up assistance and trials of ice chips,pt demonstrated ability to follow swallow strategies with initial Min verbal cues fading to supervision, clear vocal quality during trials with Min congested cough ( present with and without PO consumption.) SLP reviewed swallow strategies with Pt and emphasized need to utilize with each swallow.Pt left with family members in room. Continue updated plan of care.   Function:   Eating Eating   Modified Consistency Diet: Yes Eating Assist Level: Supervision or verbal cues;Set up assist for           Cognition Comprehension Comprehension assist level: Follows complex conversation/direction with no assist  Expression   Expression assist level: Expresses complex ideas: With no assist  Social Interaction Social Interaction assist level: Interacts appropriately with  others - No medications needed.  Problem Solving Problem solving assist level: Solves basic 90% of the time/requires cueing < 10% of the time  Memory Memory assist level: Recognizes or recalls 75 - 89% of the  time/requires cueing 10 - 24% of the time   General    Pain    Therapy/Group: Individual Therapy  Anas Reister  [facility] 11/18/2016, 3:52 PM

## 2016-11-18 NOTE — Progress Notes (Signed)
Occupational Therapy Session Note  Patient Details  Name: [name] MRN: [id number] Date of Birth: 08-Sep-1930  Today's Date: 11/18/2016 OT Individual Time: 1006-1102 OT Individual Time Calculation (min): 56 min    Short Term Goals: Week 2:  OT Short Term Goal 1 (Week 2): Continue working on established supervision level goals for discharge.  Skilled Therapeutic Interventions/Progress Updates:    Pt began session with completion of toilet transfer and toileting with min assist.  Mod assist needed for toilet hygiene in sit to stand secondary to not being able to reach buttocks.  Discussed with daughter earlier before beginning that pt needs assist with toilet hygiene as well and recommend use of grilling tongs or larger tongs to help him reach.  Once toileting was completed, he ambulated out to the wheelchair and then positioned himself at the sink for bathing.  He already washed his UB so focused on just washing his legs and feet.  Therapist did assist with washing his back as he was not able to complete this earlier.  He was able to wash his LEs with setup.  Max assist for donning TEDs with supervision for gripper socks and min assist for pull up shorts.  Pt with increased fatigue reported since PT session earlier.  Pt positioned in bed at end of session per his request with min assist for transfer using the RW and supervision to transition to supine from sitting.  Call button and phone in reach and daughter and his spouse present.  Discussed attending tomorrows therapy in preparation for discharge.  Daughter will look at the schedule and come in accordingly.   Therapy Documentation Precautions:  Precautions Precautions: Fall, Sternal Restrictions Weight Bearing Restrictions: No RUE Weight Bearing: Weight bearing as tolerated  Pain: Pain Assessment Pain Assessment: No/denies pain ADL: See Function Navigator for Current Functional Status.   Therapy/Group: Individual  Therapy  Roben Tatsch OTR/L 11/18/2016, 12:04 PM

## 2016-11-18 NOTE — Progress Notes (Signed)
ANTICOAGULATION CONSULT NOTE - Initial Consult  Pharmacy Consult for Warfarin Indication: atrial fibrillation  No Known Allergies  Patient Measurements: Height: 5\' 8"  (172.7 cm) Weight: 219 lb (99.3 kg) IBW/kg (Calculated) : 68.4  Vital Signs: Temp: 97.9 F (36.6 C) (11/06 0320) Temp Source: Oral (11/06 0320) BP: 90/51 (11/06 0325) Pulse Rate: 99 (11/06 0320)  Labs: Recent Labs    11/16/16 0610 11/17/16 0640 11/17/16 2000 11/18/16 0448  HGB  --   --  8.6*  --   HCT  --   --  27.3*  --   PLT  --   --  170  --   LABPROT 19.1* 20.8*  --  21.3*  INR 1.62 1.80  --  1.86  CREATININE  --   --  6.95*  --     Estimated Creatinine Clearance: 9 mL/min (A) (by C-G formula based on SCr of 6.95 mg/dL (H)).   Assessment: 17 yoM transferred to CIR on 10/22 after a prolonged hospital stay that included CABG/MAZE with AKI and new ESRD. He has a h/o afib on apixaban PTA, which was switched to warfarin during the last admission.   INR 1.86 sl subtherapeutic, trending up slowly, continue on amio, po intake slowly improving  Goal of Therapy:  INR 2-3 Monitor platelets by anticoagulation protocol: Yes   Plan:  Coumadin 2.5 mg po x 1 tonight (labile INR, will not do >2.5mg /day) Daily INR, monitor for s/sx of bleeding  Maryanna Shape, PharmD, BCPS  Clinical Pharmacist  Pager: [phone]  11/18/2016 1:19 PM

## 2016-11-19 ENCOUNTER — Inpatient Hospital Stay (HOSPITAL_COMMUNITY): Payer: Medicare Other | Admitting: Occupational Therapy

## 2016-11-19 ENCOUNTER — Inpatient Hospital Stay (HOSPITAL_COMMUNITY): Payer: Medicare Other

## 2016-11-19 ENCOUNTER — Inpatient Hospital Stay (HOSPITAL_COMMUNITY): Payer: Medicare Other | Admitting: Speech Pathology

## 2016-11-19 LAB — CBC
HEMATOCRIT: 27.4 % — AB (ref 39.0–52.0)
HEMOGLOBIN: 8.6 g/dL — AB (ref 13.0–17.0)
MCH: 29.2 pg (ref 26.0–34.0)
MCHC: 31.4 g/dL (ref 30.0–36.0)
MCV: 92.9 fL (ref 78.0–100.0)
Platelets: 160 10*3/uL (ref 150–400)
RBC: 2.95 MIL/uL — AB (ref 4.22–5.81)
RDW: 19.7 % — ABNORMAL HIGH (ref 11.5–15.5)
WBC: 11 10*3/uL — AB (ref 4.0–10.5)

## 2016-11-19 LAB — PROTIME-INR
INR: 1.69
PROTHROMBIN TIME: 19.8 s — AB (ref 11.4–15.2)

## 2016-11-19 LAB — BASIC METABOLIC PANEL
ANION GAP: 10 (ref 5–15)
BUN: 37 mg/dL — ABNORMAL HIGH (ref 6–20)
CHLORIDE: 95 mmol/L — AB (ref 101–111)
CO2: 28 mmol/L (ref 22–32)
CREATININE: 5.36 mg/dL — AB (ref 0.61–1.24)
Calcium: 8 mg/dL — ABNORMAL LOW (ref 8.9–10.3)
GFR calc non Af Amer: 9 mL/min — ABNORMAL LOW (ref >60)
GFR, EST AFRICAN AMERICAN: 10 mL/min — AB (ref >60)
Glucose, Bld: 90 mg/dL (ref 65–99)
POTASSIUM: 5.1 mmol/L (ref 3.5–5.1)
SODIUM: 133 mmol/L — AB (ref 135–145)

## 2016-11-19 LAB — GLUCOSE, CAPILLARY
GLUCOSE-CAPILLARY: 90 mg/dL (ref 65–99)
GLUCOSE-CAPILLARY: 105 mg/dL — AB (ref 65–99)
GLUCOSE-CAPILLARY: 127 mg/dL — AB (ref 65–99)
GLUCOSE-CAPILLARY: 104 mg/dL — AB (ref 65–99)

## 2016-11-19 MED ORDER — WARFARIN SODIUM 5 MG PO TABS
5.0000 mg | ORAL_TABLET | Freq: Once | ORAL | Status: AC
  Administered 2016-11-19: 5 mg via ORAL
  Filled 2016-11-19: qty 1

## 2016-11-19 MED ORDER — PREGABALIN 25 MG PO CAPS
25.0000 mg | ORAL_CAPSULE | Freq: Every day | ORAL | Status: DC
  Administered 2016-11-19 – 2016-11-21 (×3): 25 mg via ORAL
  Filled 2016-11-19 (×3): qty 1

## 2016-11-19 NOTE — Plan of Care (Signed)
  Consults RH GENERAL PATIENT EDUCATION Description See Patient Education module for education specifics.  11/19/2016 1752 - Progressing by Marney Setting, RN   RH BOWEL ELIMINATION RH STG MANAGE BOWEL WITH ASSISTANCE Description STG Manage Bowel with Mod Assistance.  11/19/2016 1752 - Progressing by Marney Setting, RN 11/19/2016 1648 - Progressing by Marney Setting, RN 11/19/2016 1647 - Progressing by Marney Setting, RN   RH SKIN INTEGRITY RH STG SKIN FREE OF INFECTION/BREAKDOWN Description No new breakdown with mod assist    11/19/2016 1752 - Progressing by Marney Setting, RN 11/19/2016 1648 - Progressing by Marney Setting, RN 11/19/2016 1647 - Progressing by Marney Setting, RN   RH SKIN INTEGRITY RH STG MAINTAIN SKIN INTEGRITY WITH ASSISTANCE Description STG Maintain Skin Integrity With mod Assistance.  11/19/2016 1752 - Progressing by Marney Setting, RN 11/19/2016 1648 - Progressing by Marney Setting, RN 11/19/2016 1647 - Progressing by Marney Setting, RN   RH SAFETY RH STG ADHERE TO SAFETY PRECAUTIONS W/ASSISTANCE/DEVICE Description STG Adhere to Safety Precautions With min Assistance/Device.  11/19/2016 1752 - Progressing by Marney Setting, RN 11/19/2016 1648 - Progressing by Marney Setting, RN   RH PAIN MANAGEMENT RH STG PAIN MANAGED AT OR BELOW PT'S PAIN GOAL Description <4 out of 10.   11/19/2016 1752 - Progressing by Marney Setting, RN 11/19/2016 1648 - Progressing by Marney Setting, RN   RH SKIN INTEGRITY RH STG ABLE TO PERFORM INCISION/WOUND CARE W/ASSISTANCE Description STG Able To Perform Incision/Wound Care With mod Assistance.  11/19/2016 1752 - Not Progressing by Marney Setting, RN Note Requiring total assist to apply creams  11/19/2016 1648 - Progressing by Marney Setting, RN 11/19/2016 1647 - Progressing by Marney Setting, RN Note Pt requiring assistance to apply creams

## 2016-11-19 NOTE — Progress Notes (Addendum)
Occupational Therapy Discharge Summary  Patient Details  Name: [name] MRN: [id number] Date of Birth: 09-17-1930  Today's Date: 11/19/2016 OT Individual Time:  -       Patient has met 8 of 8 long term goals due to improved activity tolerance, improved balance, postural control, ability to compensate for deficits and improved awareness.  Patient to discharge at [facility] Assist level.  Patient's care partner is independent to provide the necessary physical and cognitive assistance at discharge.    Reasons goals not met: NA  Recommendation:  Patient will benefit from ongoing skilled OT services in home health setting to continue to advance functional skills in the area of BADL and Reduce care partner burden.  Pt still demonstrates decreased endurance, decreased balance, and decreased functional strength for ADLs.  Feel pt will need 24 supervision for safety.  This has been explained to the family so they understand pt will need 24 hour supervision up to possible min assist.    Equipment: 3:1  Reasons for discharge: treatment goals met and discharge from hospital  Patient/family agrees with progress made and goals achieved: Yes  OT Discharge Precautions/Restrictions  Precautions Precautions: Fall;Sternal Restrictions Weight Bearing Restrictions: No RUE Weight Bearing: Weight bearing as tolerated  Vital Signs Therapy Vitals Temp: 97.8 F (36.6 C) Temp Source: Oral Pulse Rate: 92 Resp: 18 BP: (!) 111/55 Patient Position (if appropriate): Lying Oxygen Therapy SpO2: 98 % O2 Device: Not Delivered Pain Pain Assessment Pain Assessment: No/denies pain ADL ADL ADL Comments: Please see functional navigator Vision Baseline Vision/History: Wears glasses Wears Glasses: Distance only Patient Visual Report: No change from baseline Vision Assessment?: No apparent visual deficits Perception  Perception: Within Functional Limits Praxis Praxis: Intact Cognition Overall  Cognitive Status: Impaired/Different from baseline Arousal/Alertness: Awake/alert Orientation Level: Oriented X4 Attention: Sustained Sustained Attention: Appears intact Memory: Impaired Memory Impairment: Storage deficit;Decreased recall of new information Awareness: Appears intact Problem Solving: Impaired Problem Solving Impairment: Functional complex Safety/Judgment: Impaired Comments: Pt continues to need mod instructional cueing for hand positioning during sit to stand and stand to sit transitions.   Sensation Sensation Light Touch: Appears Intact Stereognosis: Appears Intact Hot/Cold: Appears Intact Proprioception: Appears Intact Coordination Gross Motor Movements are Fluid and Coordinated: Yes Fine Motor Movements are Fluid and Coordinated: Yes Motor  Motor Motor - Skilled Clinical Observations: Generalized weakness Mobility  Bed Mobility Supine to Sit: 4: Min assist Transfers Transfers: Sit to Stand Sit to Stand: With armrests;With upper extremity assist;5: Supervision;From chair/3-in-1 Stand to Sit: 5: Supervision;With upper extremity assist;To chair/3-in-1  Trunk/Postural Assessment  Cervical Assessment Cervical Assessment: Within Functional Limits Thoracic Assessment Thoracic Assessment: Exceptions to WFL(rounded shoulders) Lumbar Assessment Lumbar Assessment: Exceptions to WFL(increased lumbar flexion in sitting and standing)  Balance Balance Balance Assessed: Yes Static Sitting Balance Static Sitting - Balance Support: No upper extremity supported Static Sitting - Level of Assistance: 7: Independent Dynamic Sitting Balance Dynamic Sitting - Balance Support: During functional activity Dynamic Sitting - Level of Assistance: 5: Stand by assistance Static Standing Balance Static Standing - Balance Support: During functional activity;Bilateral upper extremity supported Static Standing - Level of Assistance: 5: Stand by assistance Dynamic Standing  Balance Dynamic Standing - Balance Support: During functional activity Dynamic Standing - Level of Assistance: 4: Min assist Extremity/Trunk Assessment RUE Assessment RUE Assessment: Exceptions to WFL(Pt with history of rotator cuff injury so unable to actively flex shoulder more than 30 degrees without LUE self assist.  All other AROM WFLS with strength 4/5) LUE Assessment LUE  Assessment: Within Functional Limits(AROM and strength WFLs for selfcare tasks but not formally assessed secondary to sternal precautions.  )   See Function Navigator for Current Functional Status.  MCGUIRE,JAMES 11/19/2016, 5:13 PM

## 2016-11-19 NOTE — Patient Care Conference (Signed)
Inpatient RehabilitationTeam Conference and Plan of Care Update Date: 11/19/2016   Time: 2:30 PM    Patient Name: [name]      Medical Record Number: [id number]  Date of Birth: March 10, 1930 Sex: Male         Room/Bed: 4M04C/4M04C-01 Payor Info: Payor: MEDICARE / Plan: MEDICARE PART A AND B / Product Type: *No Product type* /    Admitting Diagnosis: CAD ESRD  Admit Date/Time:  11/03/2016  4:39 PM Admission Comments: No comment available   Primary Diagnosis:  Debility Principal Problem: Debility  Patient Active Problem List   Diagnosis Date Noted  . Dependence on renal dialysis ([place])   . Fungal rash of trunk   . Right forearm pain   . Finger pain, right   . Primary osteoarthritis of right elbow   . Hemodialysis-associated hypotension   . Labile blood glucose   . Shortness of breath   . Type 2 diabetes mellitus with peripheral neuropathy (HCC)   . PAF (paroxysmal atrial fibrillation) ([place])   . Debility 11/03/2016  . Coronary artery disease involving native heart without angina pectoris   . AKI (acute kidney injury) ([place])   . Dysphagia   . Chronic diastolic heart failure ([place])   . Benign prostatic hyperplasia   . Hx of CABG   . S/P CABG (coronary artery bypass graft)   . Acute blood loss anemia   . Anemia of chronic disease   . Chronic obstructive pulmonary disease ([place])   . Cardiogenic shock ([place])   . Spontaneous pneumothorax   . Acute respiratory failure with hypercapnia ([place])   . Acute renal failure ([place])   . S/P CABG x 2 + maze procedure 10/09/2016  . S/P Maze operation for atrial fibrillation 10/09/2016  . Coronary artery disease involving native coronary artery of native heart with angina pectoris ([place]) 09/30/2016  . Fusarium infection ([place]) 01/10/2016  . Dyspnea on exertion 09/12/2015  . Chronic systolic CHF (congestive heart failure) ([place])   . Chronic kidney disease (CKD), stage IV (severe) ([place])   . Cellulitis of left lower extremity   . Pulmonary  hypertension ([place])   . Paroxysmal atrial fibrillation (HCC)   . Other emphysema ([place])   . Bronchiectasis without complication ([place])   . Other specified hypothyroidism   . CKD (chronic kidney disease) stage 4, GFR 15-29 ml/min (HCC) 08/18/2014  . Acute on chronic systolic heart failure ([place]) 08/18/2014  . Bronchiectasis ([place]) 08/18/2014  . Hypothyroidism 08/18/2014  . Bronchiectasis without acute exacerbation ([place]) 12/29/2013  . LVH (left ventricular hypertrophy) due to hypertensive disease 12/10/2013  . Atrial fibrillation ([place]) 12/08/2013  . CHF (congestive heart failure) ([place]) 12/07/2013  . Chronic obstructive airway disease with asthma ([place]) 12/07/2013  . Thyroid goiter 02/01/2013  . Low TSH level 05/18/2012  . Right thyroid nodule 05/18/2012  . Diabetes ([place]) 05/18/2012  . HLD (hyperlipidemia) 05/18/2012  . Rheumatoid arthritis ([place]) 05/18/2012  . Essential hypertension 05/18/2012  . GERD (gastroesophageal reflux disease) 05/18/2012  . Anemia 05/18/2012  . Knee pain 02/12/2012    Expected Discharge Date: Expected Discharge Date: 11/21/16  Team Members Present: Physician leading conference: Dr. Delice Lesch Social Worker Present: Ovidio Kin, LCSW Nurse Present: Leonette Nutting, RN PT Present: Michaelene Song, PT OT Present: Clyda Greener, OT SLP Present: Windell Moulding, SLP PPS Coordinator present : Daiva Nakayama, RN, CRRN     Current Status/Progress Goal Weekly Team Focus  Medical   Debility secondary to CABG/Maze procedure 10/09/2016/multi-medical. Patient has sternal  precautions  Improve safety, mobility, endurance, chronic conditions  See above   Bowel/Bladder   OlIguric, cont of bowel, LBM 11/18/16,   maintain continence with current stay  Monitor and assist with toileting needs,QS and prn, Strict I/O monitoring,,    Swallow/Nutrition/ Hydration   tolerating nectar thick liquids; mod I use of swallowing precautions   mod I   repeat MBS prior to discharge    ADL's    supervision for UB selfcare, min guard assist for LB dressing with min assist for bathing.  Toilet transfers are at min guard assist as well with toileting at mod assist.   supervision to min assist level  selfcare retraining, balance retraining, endurance training, pt/family education, DME education,    Mobility   Min assist for bed mobility, sit<>stand, transfers and gait up to 75 ft  Min A transfers and bed mobility 2/2 sternal precautions, supervision gait  endurance, adherance to sternal precautios, d/c planning, LE strengthening   Communication             Safety/Cognition/ Behavioral Observations  min assist-supervision   supervision   discharge planning    Pain   Discomfort of pain, numbness to right arm and hand, medicated x1 with Tylenol pain, pain score 8/10 continue Lidocaine patch daily x 12 hours  < 2   Assess QS, Medicate, and evaluate effectivenssof medication    Skin                *See Care Plan and progress notes for long and short-term goals.     Barriers to Discharge  Current Status/Progress Possible Resolutions Date Resolved   Physician    Medical stability;Decreased caregiver support;Hemodialysis;Lack of/limited family support;Weight;Other (comments)  RA  See above  Therapies, monitor weights, optimize pain meds, resume RA meds as outpt      Nursing                  PT  Decreased caregiver support                 OT                  SLP                SW                Discharge Planning/Teaching Needs:  Family education has been on-going and preparing for DC Friday. OP-HD arranged for T, TH & Sat in [place].      Team Discussion:  BP issues from HD and coumadin still subtherapeutic. Vascular came by and looked at arm/hand. Still working on endurance and family education with family. Aware he will require 24 hr care at discharge. SOB today-MD aware. Has home O2 if needed had prior to admission. Work toward discharge Friday  Revisions to Treatment  Plan:  DC 11/9    Continued Need for Acute Rehabilitation Level of Care: The patient requires daily medical management by a physician with specialized training in physical medicine and rehabilitation for the following conditions: Daily direction of a multidisciplinary physical rehabilitation program to ensure safe treatment while eliciting the highest outcome that is of practical value to the patient.: Yes Daily medical management of patient stability for increased activity during participation in an intensive rehabilitation regime.: Yes Daily analysis of laboratory values and/or radiology reports with any subsequent need for medication adjustment of medical intervention for : Cardiac problems;Post surgical problems;Diabetes problems;Other  Elease Hashimoto 11/19/2016, 3:56 PM

## 2016-11-19 NOTE — Progress Notes (Signed)
[facility] Progress Note   Assessment/ Plan:   1 ESRD-  Patient is scheduled for HD today and on Friday prior to discharge. Continue fluid restriction to 1200 ml a day. BP continue to be low 99/53 today. Will continue to closely monitor with HD and continue with midodrine tid. TTS schedule as OP, has been CLIP'd to [facility] [facility]. Patient will likely need adjustment in midodrine dosage in the outpatient setting.  2 Hypertension/ volume:  This am 99/53. Currently on midodrine midodrine 10 mg TID.   3 CAD- s/p CABG/Maze/Afib  On warfarin. INR 1.69 and still subtherapeutic.  Continue management per pharmacy. Goal 2-3. Last platelet count 170 on 11/5 --Follow up on am CBC and BMP --Continue amiodarone.  4 Deconditioning and debility- in CIR. Patient is tolerating therapy and progression has been appropriate. Anticipate discharge on 11/9.  5 DM- Per primary  6 Anemia- Last Hgb 8.6 (11/2), on Aranesp 200 mcg q Wednesday and ferrlicet 856 mcg x 8 doses, started 10/26. --Follow up on am CBC  7 Bone/ mineral: Phos 6.5, add velphoro 500 mg TID with meals, calcitriol   8.  L hand numbness--> 3rd-5th fingers of R hand.  Hand well-perfused, Ulnar neuropathy vs mild steal. --Consider VVS consult, for now will continue to monitor.  9 Dispo: Anticipate discharge from CIR on 11/9.    Subjective:    No acute event overnight. Patient was seen by vascular surgery yesterday and was told that pain is his right hand was not worrisome. Patient still reports some numbness and tingling in his hand.    Objective:   BP (!) 99/53 (BP Location: Left Arm)   Pulse 75   Temp 98 F (36.7 C)   Resp 18   Ht 5\' 8"  (1.727 m)   Wt 218 lb (98.9 kg)   SpO2 98%   BMI 33.15 kg/m   Physical Exam: General appearance: NAD Resp: Rhonchi heard bilaterally upper and lower lung fields Chest wall: RIJ tunneled HD cath Cardio: regular rate and rhythm, S1, S2 normal  GI: mild distension, decreased bs,  nontender Extremities: 1+ edema, AVF RUA with +T/B, R hand well-perfused.  Peeling skin bilateral hands.  Labs: BMET Recent Labs  Lab 11/12/16 1456 11/14/16 1842 11/17/16 2000 11/19/16 0551  NA 131* 129* 132* 133*  K 4.2 4.4 5.1 5.1  CL 94* 95* 95* 95*  CO2 26 24 23 28   GLUCOSE 80 201* 102* 90  BUN 34* 40* 54* 37*  CREATININE 5.87* 5.47* 6.95* 5.36*  CALCIUM 8.0* 8.0* 8.1* 8.0*  PHOS 3.6 2.9 2.7  --    CBC Recent Labs  Lab 11/12/16 1456 11/14/16 1842 11/17/16 2000 11/19/16 0551  WBC 13.8* 11.9* 13.6* 11.0*  HGB 8.4* 8.6* 8.6* 8.6*  HCT 26.6* 27.5* 27.3* 27.4*  MCV 92.4 92.9 92.2 92.9  PLT 257 189 170 160    @IMGRELPRIORS @ Medications:    . amiodarone  200 mg Oral Q12H  . aspirin  81 mg Per Tube Daily  . atorvastatin  40 mg Oral q1800  . [START ON 11/20/2016] darbepoetin (ARANESP) injection - DIALYSIS  200 mcg Intravenous Q Thu-HD  . diclofenac sodium  2 g Topical QID  . feeding supplement (GLUCERNA SHAKE)  237 mL Oral BID BM  . finasteride  5 mg Oral QHS  . Gerhardt's butt cream   Topical BID  . guaiFENesin  600 mg Oral BID  . hydrocortisone cream   Topical BID  . insulin aspart  0-15 Units Subcutaneous TID  WC  . insulin detemir  20 Units Subcutaneous Daily  . levothyroxine  25 mcg Per Tube QAC breakfast  . lidocaine  1 patch Transdermal Q24H  . midodrine  10 mg Oral TID WC  . multivitamin  1 tablet Oral QHS  . nystatin cream   Topical BID  . pantoprazole  40 mg Oral Daily  . predniSONE  5 mg Oral Q breakfast  . pregabalin  25 mg Oral Daily  . tamsulosin  0.4 mg Oral QHS  . Warfarin - Pharmacist Dosing Inpatient   Does not apply [place], MD [place], PGY-2  11/19/2016, 8:54 AM

## 2016-11-19 NOTE — Progress Notes (Signed)
Social Work   Juline Sanderford, Eliezer Champagne  Social Worker  Physical Medicine and Rehabilitation  Patient Care Conference  Signed  Date of Service:  11/19/2016  3:56 PM          Signed          [] Hide copied text  [] Hover for details   Inpatient RehabilitationTeam Conference and Plan of Care Update Date: 11/19/2016   Time: 2:30 PM      Patient Name: TAIMUR FIER      Medical Record Number: [id number]  Date of Birth: [birth year] Sex: Male         Room/Bed: 4M04C/4M04C-01 Payor Info: Payor: MEDICARE / Plan: MEDICARE PART A AND B / Product Type: *No Product type* /     Admitting Diagnosis: CAD ESRD  Admit Date/Time:  11/03/2016  4:39 PM Admission Comments: No comment available    Primary Diagnosis:  Debility Principal Problem: Debility       Patient Active Problem List    Diagnosis Date Noted  . Dependence on renal dialysis ([place])    . Fungal rash of trunk    . Right forearm pain    . Finger pain, right    . Primary osteoarthritis of right elbow    . Hemodialysis-associated hypotension    . Labile blood glucose    . Shortness of breath    . Type 2 diabetes mellitus with peripheral neuropathy (HCC)    . PAF (paroxysmal atrial fibrillation) ([place])    . Debility 11/03/2016  . Coronary artery disease involving native heart without angina pectoris    . AKI (acute kidney injury) ([place])    . Dysphagia    . Chronic diastolic heart failure ([place])    . Benign prostatic hyperplasia    . Hx of CABG    . S/P CABG (coronary artery bypass graft)    . Acute blood loss anemia    . Anemia of chronic disease    . Chronic obstructive pulmonary disease ([place])    . Cardiogenic shock ([place])    . Spontaneous pneumothorax    . Acute respiratory failure with hypercapnia ([place])    . Acute renal failure ([place])    . S/P CABG x 2 + maze procedure 10/09/2016  . S/P Maze operation for atrial fibrillation 10/09/2016  . Coronary artery disease involving native coronary artery of native heart with  angina pectoris ([place]) 09/30/2016  . Fusarium infection ([place]) 01/10/2016  . Dyspnea on exertion 09/12/2015  . Chronic systolic CHF (congestive heart failure) ([place])    . Chronic kidney disease (CKD), stage IV (severe) ([place])    . Cellulitis of left lower extremity    . Pulmonary hypertension ([place])    . Paroxysmal atrial fibrillation (HCC)    . Other emphysema ([place])    . Bronchiectasis without complication ([place])    . Other specified hypothyroidism    . CKD (chronic kidney disease) stage 4, GFR 15-29 ml/min (HCC) 08/18/2014  . Acute on chronic systolic heart failure ([place]) 08/18/2014  . Bronchiectasis ([place]) 08/18/2014  . Hypothyroidism 08/18/2014  . Bronchiectasis without acute exacerbation ([place]) 12/29/2013  . LVH (left ventricular hypertrophy) due to hypertensive disease 12/10/2013  . Atrial fibrillation ([place]) 12/08/2013  . CHF (congestive heart failure) ([place]) 12/07/2013  . Chronic obstructive airway disease with asthma ([place]) 12/07/2013  . Thyroid goiter 02/01/2013  . Low TSH level 05/18/2012  . Right thyroid nodule 05/18/2012  . Diabetes ([place]) 05/18/2012  . HLD (hyperlipidemia) 05/18/2012  . Rheumatoid arthritis (  [place]) 05/18/2012  . Essential hypertension 05/18/2012  . GERD (gastroesophageal reflux disease) 05/18/2012  . Anemia 05/18/2012  . Knee pain 02/12/2012      Expected Discharge Date: Expected Discharge Date: 11/21/16   Team Members Present: Physician leading conference: Dr. Delice Lesch Social Worker Present: Ovidio Kin, LCSW Nurse Present: Leonette Nutting, RN PT Present: Michaelene Song, PT OT Present: Clyda Greener, OT SLP Present: Windell Moulding, SLP PPS Coordinator present : Daiva Nakayama, RN, CRRN       Current Status/Progress Goal Weekly Team Focus  Medical     Debility secondary to CABG/Maze procedure 10/09/2016/multi-medical. Patient has sternal precautions  Improve safety, mobility, endurance, chronic conditions  See above   Bowel/Bladder     OlIguric, cont of  bowel, LBM 11/18/16,   maintain continence with current stay  Monitor and assist with toileting needs,QS and prn, Strict I/O monitoring,,    Swallow/Nutrition/ Hydration     tolerating nectar thick liquids; mod I use of swallowing precautions   mod I   repeat MBS prior to discharge    ADL's     supervision for UB selfcare, min guard assist for LB dressing with min assist for bathing.  Toilet transfers are at min guard assist as well with toileting at mod assist.   supervision to min assist level  selfcare retraining, balance retraining, endurance training, pt/family education, DME education,    Mobility     Min assist for bed mobility, sit<>stand, transfers and gait up to 41 ft  Min A transfers and bed mobility 2/2 sternal precautions, supervision gait  endurance, adherance to sternal precautios, d/c planning, LE strengthening   Communication               Safety/Cognition/ Behavioral Observations   min assist-supervision   supervision   discharge planning    Pain     Discomfort of pain, numbness to right arm and hand, medicated x1 with Tylenol pain, pain score 8/10 continue Lidocaine patch daily x 12 hours  < 2   Assess QS, Medicate, and evaluate effectivenssof medication    Skin                 *See Care Plan and progress notes for long and short-term goals.      Barriers to Discharge   Current Status/Progress Possible Resolutions Date Resolved   Physician     Medical stability;Decreased caregiver support;Hemodialysis;Lack of/limited family support;Weight;Other (comments)  RA  See above  Therapies, monitor weights, optimize pain meds, resume RA meds as outpt      Nursing                 PT  Decreased caregiver support                 OT                 SLP            SW              Discharge Planning/Teaching Needs:  Family education has been on-going and preparing for DC Friday. OP-HD arranged for T, TH & Sat in [place].      Team Discussion:  BP issues from HD and  coumadin still subtherapeutic. Vascular came by and looked at arm/hand. Still working on endurance and family education with family. Aware he will require 24 hr care at discharge. SOB today-MD aware. Has home O2 if needed had prior to admission. Work toward  discharge Friday  Revisions to Treatment Plan:  DC 11/9    Continued Need for Acute Rehabilitation Level of Care: The patient requires daily medical management by a physician with specialized training in physical medicine and rehabilitation for the following conditions: Daily direction of a multidisciplinary physical rehabilitation program to ensure safe treatment while eliciting the highest outcome that is of practical value to the patient.: Yes Daily medical management of patient stability for increased activity during participation in an intensive rehabilitation regime.: Yes Daily analysis of laboratory values and/or radiology reports with any subsequent need for medication adjustment of medical intervention for : Cardiac problems;Post surgical problems;Diabetes problems;Other   Elease Hashimoto 11/19/2016, 3:56 PM                Ronnika Collett, Gardiner Rhyme, LCSW Social Worker Signed Physical Medicine and Rehabilitation  Patient Care Conference Date of Service: 11/12/2016  3:22 PM      Hide copied text Hover for attribution information Inpatient RehabilitationTeam Conference and Plan of Care Update Date: 11/12/2016   Time: 2:10 PM      Patient Name: TREVON STROTHERS      Medical Record Number: [id number]  Date of Birth: 1930-02-21 Sex: Male         Room/Bed: 4M04C/4M04C-01 Payor Info: Payor: MEDICARE / Plan: MEDICARE PART A AND B / Product Type: *No Product type* /     Admitting Diagnosis: CAD ESRD  Admit Date/Time:  11/03/2016  4:39 PM Admission Comments: No comment available    Primary Diagnosis:  <principal problem not specified> Principal Problem: <principal problem not specified>       Patient Active Problem List    Diagnosis  Date Noted  . Leukocytosis    . Subtherapeutic international normalized ratio (INR)    . Hypoglycemia    . Supratherapeutic INR    . Primary osteoarthritis of right elbow    . Nausea    . Hemodialysis-associated hypotension    . Labile blood glucose    . Shortness of breath    . Type 2 diabetes mellitus with peripheral neuropathy (HCC)    . PAF (paroxysmal atrial fibrillation) ([place])    . Debility 11/03/2016  . Coronary artery disease involving native heart without angina pectoris    . AKI (acute kidney injury) ([place])    . Dysphagia    . Chronic diastolic heart failure ([place])    . Benign prostatic hyperplasia    . Hx of CABG    . S/P CABG (coronary artery bypass graft)    . Acute blood loss anemia    . Anemia of chronic disease    . Chronic obstructive pulmonary disease ([place])    . Cardiogenic shock ([place])    . Spontaneous pneumothorax    . Acute respiratory failure with hypercapnia ([place])    . Acute renal failure ([place])    . S/P CABG x 2 + maze procedure 10/09/2016  . S/P Maze operation for atrial fibrillation 10/09/2016  . Coronary artery disease involving native coronary artery of native heart with angina pectoris ([place]) 09/30/2016  . Fusarium infection ([place]) 01/10/2016  . Dyspnea on exertion 09/12/2015  . Sepsis due to undetermined organism ([place])    . Chronic systolic CHF (congestive heart failure) ([place])    . Chronic kidney disease (CKD), stage IV (severe) ([place])    . Cellulitis of left lower extremity    . Pulmonary hypertension ([place])    . Paroxysmal atrial fibrillation (HCC)    . Other emphysema ([place])    .  Bronchiectasis without complication ([place])    . Other specified hypothyroidism    . Fever 08/18/2014  . Sepsis ([place]) 08/18/2014  . CKD (chronic kidney disease) stage 4, GFR 15-29 ml/min (HCC) 08/18/2014  . Acute on chronic systolic heart failure ([place]) 08/18/2014  . Bronchiectasis ([place]) 08/18/2014  . Hypothyroidism 08/18/2014  . Bronchiectasis without acute exacerbation  ([place]) 12/29/2013  . LVH (left ventricular hypertrophy) due to hypertensive disease 12/10/2013  . Atrial fibrillation ([place]) 12/08/2013  . CHF (congestive heart failure) ([place]) 12/07/2013  . Chronic obstructive airway disease with asthma ([place]) 12/07/2013  . Thyroid goiter 02/01/2013  . Low TSH level 05/18/2012  . Right thyroid nodule 05/18/2012  . Diabetes ([place]) 05/18/2012  . HLD (hyperlipidemia) 05/18/2012  . Rheumatoid arthritis ([place]) 05/18/2012  . Essential hypertension 05/18/2012  . GERD (gastroesophageal reflux disease) 05/18/2012  . Anemia 05/18/2012  . Knee pain 02/12/2012      Expected Discharge Date: Expected Discharge Date: 11/21/16   Team Members Present: Physician leading conference: Dr. Delice Lesch Social Worker Present: Ovidio Kin, LCSW Nurse Present: Dorien Chihuahua, RN PT Present: Michaelene Song, PT OT Present: Clyda Greener, OT SLP Present: Windell Moulding, SLP PPS Coordinator present : Daiva Nakayama, RN, CRRN       Current Status/Progress Goal Weekly Team Focus  Medical     Debility secondary to CABG/Maze procedure 10/09/2016/multi-medical. Patient has sternal precautions  Improve safety, endurance, chronic conditions  See above   Bowel/Bladder     continent of bowels; oliguric  maintain continence with current stay      Swallow/Nutrition/ Hydration     Upgraded to nectar thick liquids; full supervision   mod I   use of swallowing precautions, trials of ice chips   ADL's     supervision for UB selfcare, min assist for LB selfcare and functional transfers to the toilet, still with low BP and limited endurance  modified independent to supervision  selfcare retraining, energy conservation, transfer training, balance retraining, DME/AE education,   Mobility     Min A transfers, Mod A bed mobility, Min guard gait up to 106'  Min A transfers and bed mobility 2/2 sternal precautions, supervision gait  Endurance, decrease supplemental O2 requirement, adherence to sternal  precautions, discharge planning    Communication               Safety/Cognition/ Behavioral Observations   min assist-supervision; fluctuates due to fatigue   supervision   continue to address recall of daily information   Pain     c/o elbow pain / osteoarthritis  less than 2 pain      Skin     masd - buttock;rt chest- hd cath; lt grt toe- old amputaion; chest- scabbed/ healing incision  masd to butt to heal with current treatment        *See Care Plan and progress notes for long and short-term goals.      Barriers to Discharge   Current Status/Progress Possible Resolutions Date Resolved   Physician     Medical stability;Decreased caregiver support;Hemodialysis;Lack of/limited family support;Weight;Other (comments)  OA  See above  Therapies, follow labs, monitor weights, optimize DM, optimize pain meds      Nursing                 PT  Inaccessible home environment;Home environment access/layout;Weight bearing restrictions  5 adult children very involved  Min A overall, working on endurance            OT  SLP            SW              Discharge Planning/Teaching Needs:  Unsure if plan home versus NH, will need to know about HD and if needs to continue this as OP. Wife needs care due to her dementia      Team Discussion:  Goals supervision-min assist level. Diet upgraded to nectar thick liquids. Elbow pain MD prescribing voltaren and pain patch now. Still congested and breathing issues on 3 l O2. HD-M-W-F need to set up as OP. Family supportive begin family education  Revisions to Treatment Plan:  DC 11/9    Continued Need for Acute Rehabilitation Level of Care: The patient requires daily medical management by a physician with specialized training in physical medicine and rehabilitation for the following conditions: Daily direction of a multidisciplinary physical rehabilitation program to ensure safe treatment while eliciting the highest outcome that is of  practical value to the patient.: Yes Daily medical management of patient stability for increased activity during participation in an intensive rehabilitation regime.: Yes Daily analysis of laboratory values and/or radiology reports with any subsequent need for medication adjustment of medical intervention for : Cardiac problems;Post surgical problems;Diabetes problems;Other   Elease Hashimoto 11/14/2016, 9:21 AM          Havier Deeb, Gardiner Rhyme, LCSW Social Worker Signed Physical Medicine and Rehabilitation  Patient Care Conference Date of Service: 11/05/2016  3:07 PM      Hide copied text Hover for attribution information Inpatient RehabilitationTeam Conference and Plan of Care Update Date: 11/05/2016   Time: 2:25 PM      Patient Name: RACER QUAM      Medical Record Number: [id number]  Date of Birth: 01-10-1931 Sex: Male         Room/Bed: 4M04C/4M04C-01 Payor Info: Payor: MEDICARE / Plan: MEDICARE PART A AND B / Product Type: *No Product type* /     Admitting Diagnosis: CAD ESRD  Admit Date/Time:  11/03/2016  4:39 PM Admission Comments: No comment available    Primary Diagnosis:  <principal problem not specified> Principal Problem: <principal problem not specified>       Patient Active Problem List    Diagnosis Date Noted  . Shortness of breath    . Type 2 diabetes mellitus with peripheral neuropathy (HCC)    . PAF (paroxysmal atrial fibrillation) ([place])    . Debility 11/03/2016  . Coronary artery disease involving native heart without angina pectoris    . AKI (acute kidney injury) ([place])    . Dysphagia    . Chronic diastolic heart failure ([place])    . Benign prostatic hyperplasia    . Hx of CABG    . S/P CABG (coronary artery bypass graft)    . Acute blood loss anemia    . Anemia of chronic disease    . Chronic obstructive pulmonary disease ([place])    . Cardiogenic shock ([place])    . Spontaneous pneumothorax    . Acute respiratory failure with hypercapnia ([place])    . Acute  renal failure ([place])    . S/P CABG x 2 + maze procedure 10/09/2016  . S/P Maze operation for atrial fibrillation 10/09/2016  . Coronary artery disease involving native coronary artery of native heart with angina pectoris ([place]) 09/30/2016  . Fusarium infection ([place]) 01/10/2016  . Dyspnea on exertion 09/12/2015  . Sepsis due to undetermined organism ([place])    . Chronic systolic CHF (congestive  heart failure) ([place])    . Chronic kidney disease (CKD), stage IV (severe) ([place])    . Cellulitis of left lower extremity    . Pulmonary hypertension ([place])    . Paroxysmal atrial fibrillation (HCC)    . Other emphysema ([place])    . Bronchiectasis without complication ([place])    . Other specified hypothyroidism    . Fever 08/18/2014  . Sepsis ([place]) 08/18/2014  . CKD (chronic kidney disease) stage 4, GFR 15-29 ml/min (HCC) 08/18/2014  . Acute on chronic systolic heart failure ([place]) 08/18/2014  . Bronchiectasis ([place]) 08/18/2014  . Hypothyroidism 08/18/2014  . Bronchiectasis without acute exacerbation ([place]) 12/29/2013  . LVH (left ventricular hypertrophy) due to hypertensive disease 12/10/2013  . Atrial fibrillation ([place]) 12/08/2013  . CHF (congestive heart failure) ([place]) 12/07/2013  . Chronic obstructive airway disease with asthma ([place]) 12/07/2013  . Thyroid goiter 02/01/2013  . Low TSH level 05/18/2012  . Right thyroid nodule 05/18/2012  . Diabetes ([place]) 05/18/2012  . HLD (hyperlipidemia) 05/18/2012  . Rheumatoid arthritis ([place]) 05/18/2012  . Essential hypertension 05/18/2012  . GERD (gastroesophageal reflux disease) 05/18/2012  . Anemia 05/18/2012  . Knee pain 02/12/2012      Expected Discharge Date:     Team Members Present: Physician leading conference: Dr. Delice Lesch Social Worker Present: Ovidio Kin, LCSW Nurse Present: Arelia Sneddon, RN PT Present: Michaelene Song, PT OT Present: Clyda Greener, OT SLP Present: Windell Moulding, SLP PPS Coordinator present : Daiva Nakayama, RN, CRRN        Current Status/Progress Goal Weekly Team Focus  Medical     Debility secondary to CABG/Maze procedure 10/09/2016/multi-medical. Patient has sternal precautions  Improve safety, endurance, dysphagia  See above   Bowel/Bladder     (P) Continent of bowel and bladder, LBM 11-04-16 olguric  (P) remain continent of bowel and bladder  (P) Assisting with tolieting needs prn   Swallow/Nutrition/ Hydration     Dys 3, honey thick liquids; full supervision   mod I   trials of ice chips following oral care    ADL's        modified independent to supervision      Mobility     Max A transfers, Mod-Max A bed mobility, refused gait and w/c mobility at eval   Min A overall   Transfers, tolerance to OOB activity, adherence to sternal precautions, endurance, initiate gait    Communication               Safety/Cognition/ Behavioral Observations   min-mod assist  supervision   recall of daily information   Pain     (P) n         Skin                 *See Care Plan and progress notes for long and short-term goals.      Barriers to Discharge   Current Status/Progress Possible Resolutions Date Resolved   Physician     Medical stability;Decreased caregiver support;Hemodialysis;Lack of/limited family support     See above  Therapies, follow labs, monitor weights, optimize DM      Nursing                 PT  Inaccessible home environment;Home environment access/layout;Weight bearing restrictions  WB restrictions - sternal precautions, 5 adult children very involved, lives w/ wife who has memory issues               OT  SLP            SW Decreased caregiver support Wife has memory issues and needs supervision herself             Discharge Planning/Teaching Needs:    Home with family providing care if possible. 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 May need short term NHP     Team Discussion:  Goals min-supervision level. Multiple medical issues  being addressed. HD last night early this am, did not sleep well due to this. MD watching anemia and diabetes. Pain in sacrum 10-10 reported by RN. MD aware. May change to 15/7 due to endurance and fatigue issues. Wait to set target DC until next week.  Revisions to Treatment Plan:  Set DC next week    Continued Need for Acute Rehabilitation Level of Care: The patient requires daily medical management by a physician with specialized training in physical medicine and rehabilitation for the following conditions: Daily direction of a multidisciplinary physical rehabilitation program to ensure safe treatment while eliciting the highest outcome that is of practical value to the patient.: Yes Daily medical management of patient stability for increased activity during participation in an intensive rehabilitation regime.: Yes Daily analysis of laboratory values and/or radiology reports with any subsequent need for medication adjustment of medical intervention for : Cardiac problems;Post surgical problems;Diabetes problems;Other   Elease Hashimoto 11/05/2016, 3:07 PM           Patient ID: Irena Cords, male   DOB: Mar 06, 1930, 83 y.o.   MRN: [id number]

## 2016-11-19 NOTE — Progress Notes (Signed)
Social Work Patient ID: [name], male   DOB: [birth year], 83 y.o.   MRN: [id number]  Met with pt and numerous family members-daughter, son's and wife to discuss team conference progression toward his goals and plan for discharge still Friday. They have been in for family training and know he will require 24 hr care at home. Hoping not forever and he will get strong enough to be alone and do will on his own. Children are very committed to both parents and will do whatever is needed to assist them. Made aware his OP-HD is T, TH and Sat 5:50 am. Have made home health referral for therapies and equipment is being delivered to room prior to discharge home. All feel prepared for discharge on Friday.

## 2016-11-19 NOTE — Progress Notes (Signed)
Speech Language Pathology Daily Session Note  Patient Details  Name: [name] MRN: [id number] Date of Birth: 27-Oct-1930  Today's Date: 11/19/2016 SLP Individual Time: 1335-1400 SLP Individual Time Calculation (min): 25 min  Short Term Goals: Week 3: SLP Short Term Goal 1 (Week 3): STG=LTG due to ELOS  Skilled Therapeutic Interventions:  Pt was seen for skilled ST targeting dysphagia goals.  Pt consumed therapeutic trials of thin liquids following oral care to prepare for repeat swallow assessment.  Pt utilized swallowing precautions with mod I and demonstrated no overt s/s of aspiration with thin liquids.  Pt was also able to complete 30 repetitions of EMST with mod I and a self perceived effort level of 5 out of 10.  Plan for MBS tomorrow to determine readiness to advance prior to discharge home.  Continue per current plan of care.    Function:  Eating Eating   Modified Consistency Diet: Yes Eating Assist Level: Supervision or verbal cues           Cognition Comprehension Comprehension assist level: Follows complex conversation/direction with no assist  Expression   Expression assist level: Expresses complex ideas: With no assist  Social Interaction Social Interaction assist level: Interacts appropriately with others - No medications needed.  Problem Solving Problem solving assist level: Solves basic 90% of the time/requires cueing < 10% of the time  Memory Memory assist level: Recognizes or recalls 75 - 89% of the time/requires cueing 10 - 24% of the time    Pain Pain Assessment Pain Assessment: No/denies pain  Therapy/Group: Individual Therapy  Shanterica Biehler, Selinda Orion 11/19/2016, 3:52 PM

## 2016-11-19 NOTE — Progress Notes (Signed)
Occupational Therapy Session Note  Patient Details  Name: [name] MRN: [id number] Date of Birth: 01-01-1931  Today's Date: 11/19/2016 OT Individual Time: 0626-9485 OT Individual Time Calculation (min): 72 min    Short Term Goals: Week 2:  OT Short Term Goal 1 (Week 2): Continue working on established supervision level goals for discharge.  Skilled Therapeutic Interventions/Progress Updates:    Pt completed bathing and dressing during session with daughter and spouse present for education.  He had already completed peri washing, donning new brief, and donning new shorts prior to OT session.  He was able to sit at the sink and complete UB bathing and washing LEs and feet with supervision.  Pt's spouse assisted with washing his back.  He donned gripper socks as well with setup after pt's daughter and therapist donned TEDs.  Once he completed dressing he combed his hair with supervision and was then transported down to the ADL apartment for education on simulated walk-in shower transfers.  He was able to step in posteriorly with use of the RW and supervision.  Discussed needing direct supervision during all transfers and sit to stand.  Pt's daughter voices understanding.  He may not shower initially at home until central catheter is removed.  Next had pt finish up with work on sit to stand transitions from the therapy mat and static standing balance.  Pt needed mod demonstrational cueing for greater forward weightshift with sit to stand as well as min assist.  Once standing he needed min assist for static standing balance without UE support.  He was able to perform 4 intervals of standing, with the longest one lasting only 30-40 seconds. LOB posteriorly after fatiguing.  Pt's daughter able to observe this with therapist emphasizing the need to have direct supervision min assist for transfers with the RW.  Returned pt back to the room with call button and phone in reach.    Therapy  Documentation Precautions:  Precautions Precautions: Fall, Sternal Restrictions Weight Bearing Restrictions: No RUE Weight Bearing: Weight bearing as tolerated  Pain: Pain Assessment Pain Assessment: No/denies pain ADL: See Function Navigator for Current Functional Status.   Therapy/Group: Individual Therapy  Shalicia Craghead OTR/L 11/19/2016, 12:19 PM

## 2016-11-19 NOTE — Progress Notes (Signed)
Physical Therapy Session Note  Patient Details  Name: [name] MRN: [id number] Date of Birth: May 02, 1930  Today's Date: 11/19/2016 PT Individual Time: 1100-1140, 7711-6579 PT Individual Time Calculation (min): 40 min and 30 min  and Today's Date: 11/19/2016 PT Missed Time: 20 Minutes Missed Time Reason: Patient fatigue(low blood pressure, pt reports not feeling good)  Short Term Goals: Week 2:  PT Short Term Goal 1 (Week 2): =LTGs due to ELOS  Skilled Therapeutic Interventions/Progress Updates:    Session 1: Pt seated in w/c upon PT arrival, agreeable to therapy tx and denies pain. Pt transported to gym in w/c total assist. Pt ambulated x 55 ft with RW and min assist, SpO2 dropped to 84% and returned to 94% with x 3 minutes seated rest break with verbal cues for breathing technique. Pt ambulated 2 x 30 ft with seated rest break between, SpO2 88% after activity. SpO2 continuing to drop even with seated rest breaks and focus on breathing, maintaining 80-90% range. BP monitored: 88/47. Pt transported back to room and RN notified of findings. Pt transferred from w/c>bed stand pivot using RW and min assist, pt transferred sitting>supine with supervision. Pt left supine in bed with family present and RN presents. Pt missed 20 minutes of skilled therapeutic tx this session due to low BP and fatigue.   Session 2: Pt supine in bed upon PT arrival, agreeable to therapy tx and denies pain. Pt transferred from supine>sitting with min assist. Pt transferred from bed>w/c with min assist, stand pivot using RW. Pt transported to gym in w/c total assist. Pt performed sit<>stands 2 x 5 without UE support for LE strengthening and activity tolerance, SpO2 >96%. Pt ambulated x 65 ft, 48 ft and x 54 ft using RW with supervision fading to min assist as pt becomes fatigued, SpO2 >98%. Pt transported to dayroom in w/c. Pt transferred from w/c<>nustep via ambulation with RW and min assist. Pt used nustep x 8 minutes using  LEs only on workload 3 working on cardiovascular endurance and LE strengthening. Pt left seated in w/c at end of session with needs in reach and family present.   Therapy Documentation Precautions:  Precautions Precautions: Fall, Sternal Restrictions Weight Bearing Restrictions: No RUE Weight Bearing: Weight bearing as tolerated   See Function Navigator for Current Functional Status.   Therapy/Group: Individual Therapy  Netta Corrigan, PT, DPT 11/19/2016, 11:11 AM

## 2016-11-19 NOTE — Progress Notes (Signed)
[facility]     PROGRESS NOTE  Subjective/Complaints:  Pt seen laying in bed this AM.  He states he slept well overnight.  He states he was seen by Vascular yesterday.  He denies elbow pain, but states he does not know how it will feel with HD today.   ROS: Denies nausea, vomiting, diarrhea, shortness of breath or chest pain   Objective: Vital Signs: Blood pressure (!) 99/53, pulse 75, temperature 98 F (36.7 C), resp. rate 18, height 5\' 8"  (1.727 m), weight 98.9 kg (218 lb), SpO2 98 %. No results found. Recent Labs    11/17/16 2000 11/19/16 0551  WBC 13.6* 11.0*  HGB 8.6* 8.6*  HCT 27.3* 27.4*  PLT 170 160   Recent Labs    11/17/16 2000 11/19/16 0551  NA 132* 133*  K 5.1 5.1  CL 95* 95*  GLUCOSE 102* 90  BUN 54* 37*  CREATININE 6.95* 5.36*  CALCIUM 8.1* 8.0*   CBG (last 3)  Recent Labs    11/18/16 1632 11/18/16 2121 11/19/16 0642  GLUCAP 119* 122* 90    Wt Readings from Last 3 Encounters:  11/19/16 98.9 kg (218 lb)  11/03/16 92.6 kg (204 lb 2.3 oz)  10/06/16 99 kg (218 lb 5 oz)    Physical Exam:  BP (!) 99/53 (BP Location: Left Arm)   Pulse 75   Temp 98 F (36.7 C)   Resp 18   Ht 5\' 8"  (1.727 m)   Wt 98.9 kg (218 lb)   SpO2 98%   BMI 33.15 kg/m  Constitutional: He appears well-developed. NAD. Frail  HENT: Normocephalic and atraumatic.  Eyes: EOM are normal. No discharge.  Cardiovascular: RRR. No JVD. Respiratory: +Clear.  Normal effort  GI: BS +, non-distended   Musculoskeletal: He exhibits no edema. No tenderness. Neurological:  Alert and oriented Motor: 4+/5 B/l UE  4-/5 b/l LE (upper extremities stronger than lower extremities, unchanged) Skin: Skin is warm and dry.  Psychiatric: Normal mood and behavior.  Assessment/Plan: 1. Functional deficits secondary to debility which require 3+ hours per day of interdisciplinary therapy in a comprehensive inpatient rehab setting. Physiatrist is providing close team  supervision and 24 hour management of active medical problems listed below. Physiatrist and rehab team continue to assess barriers to discharge/monitor patient progress toward functional and medical goals.  Function:  Bathing Bathing position   Position: Wheelchair/chair at sink  Bathing parts Body parts bathed by patient: Left lower leg, Right lower leg, Front perineal area, Right upper leg, Left upper leg Body parts bathed by helper: Back, Buttocks  Bathing assist Assist Level: Touching or steadying assistance(Pt > 75%)      Upper Body Dressing/Undressing Upper body dressing   What is the patient wearing?: Pull over shirt/dress     Pull over shirt/dress - Perfomed by patient: Thread/unthread left sleeve, Put head through opening, Thread/unthread right sleeve Pull over shirt/dress - Perfomed by helper: Pull shirt over trunk        Upper body assist Assist Level: Supervision or verbal cues      Lower Body Dressing/Undressing Lower body dressing   What is the patient wearing?: Non-skid slipper socks, Ted Hose, Pants     Pants- Performed by patient: Thread/unthread right pants leg, Thread/unthread left pants leg, Pull pants up/down Pants- Performed by helper: Pull pants up/down Non-skid slipper socks- Performed by patient: Don/doff right sock, Don/doff left sock Non-skid slipper socks- Performed by helper: Don/doff right sock, Don/doff left sock  TED Hose - Performed by helper: Don/doff right TED hose, Don/doff left TED hose  Lower body assist Assist for lower body dressing: Touching or steadying assistance (Pt > 75%)      Toileting Toileting Toileting activity did not occur: No continent bowel/bladder event   Toileting steps completed by helper: Adjust clothing prior to toileting, Performs perineal hygiene, Adjust clothing after toileting Toileting Assistive Devices: Grab bar or rail  Toileting assist Assist level: Supervision or verbal cues    Transfers Chair/bed transfer   Chair/bed transfer method: Stand pivot, Ambulatory Chair/bed transfer assist level: Touching or steadying assistance (Pt > 75%) Chair/bed transfer assistive device: Medical sales representative Ambulation activity did not occur: Refused   Max distance: 38' Assist level: Supervision or verbal cues   Wheelchair Wheelchair activity did not occur: Refused Type: Manual      Cognition Comprehension Comprehension assist level: Follows complex conversation/direction with no assist  Expression Expression assist level: Expresses complex ideas: With no assist  Social Interaction Social Interaction assist level: Interacts appropriately with others - No medications needed.  Problem Solving Problem solving assist level: Solves basic 90% of the time/requires cueing < 10% of the time  Memory Memory assist level: Recognizes or recalls 75 - 89% of the time/requires cueing 10 - 24% of the time    Medical Problem List and Plan: 1.  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 Patient has sternal precautions  Cont CIR 2.  DVT Prophylaxis/Anticoagulation: Chronic Coumadin therapy for history of PAF   INR Subtherapeutic on 11/7, will speak with pharmacy 3. Pain Management: Neurontin 300 mg twice a day, Ultram as needed 4. Mood: Provide emotional support 5. Neuropsych: This patient is capable of making decisions on his own behalf. 6. Skin/Wound Care: Routine skin checks  Nystatin cream ordered for fungal rash. 7. Fluids/Electrolytes/Nutrition: Routine I&Os 8.AKI/CKD stage 3-4. Follow-up renal services. Presently receiving hemodialysis Monday Wednesday Friday schedule monitoring for recovery of renal function.S/P right IJ tunneled dialysis catheter/right brachiocephalic AV fistula 73/41/9379  Appreciate Neprho recs, cont HD per Nephro, not able to tolerate full session of HD yesterday 9. Acute on chronic anemia. Continue iron supplement  Hb  8.6 on 11/7  Cont to monitor 10. Dysphagia. Dysphagia #3 nectar liquids. Monitor hydration.   Advance diet as tolerated 11. Chronic diastolic congestive heart failure. Monitor for any signs of fluid overload.  Filed Weights   11/17/16 2247 11/17/16 2300 11/19/16 0700  Weight: 92.1 kg (203 lb 0.7 oz) 99.3 kg (219 lb) 98.9 kg (218 lb)   Unreliable  CXR reviewed, suggesting CHF  Not requiring supplemental Oxygen anymore, despite requiring 2 L PTA 12. PAF. Chronic Coumadin, Lopressor 12.5 mg twice a day, amiodarone 200 mg every 12 hours, Midodrine 10 mg 3 times a day  Appreciate cards recs, notes reviewed 13. Diabetes mellitus and peripheral neuropathy. Hemoglobin A1c 7.2. Check blood sugars before meals and at bedtime.  Reduced levemir to 20 u daily on 10/28  Will maintain SSI given initiation of steroids  Relatively controlled on 11/7 14. BPH. Proscar 5 mg daily, Flomax 0.4 mg daily 15. Hyperlipidemia. Lipitor 16. Hypothyroidism. Synthroid 17.  Hypotension   See  above  Related to HD.  Fluid management per nephro. 18.  RA  Monthly infusion PTA.    Discussed with pharmacy home medication/dosage, likely on orencia, not due until later this month  Voltaren GEL ordered with improvement  Lidoderm patch also ordered on 10/31  Prednisone 5mg  started on 11/1 19. Leukocytosis,  persistent  WBCs 11.0 on 11/7, labs with HD  Repeat CXR reviewed, slight improvement  Continue to monitor  Afebrile  20. 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

## 2016-11-19 NOTE — Progress Notes (Signed)
ANTICOAGULATION CONSULT NOTE - Initial Consult  Pharmacy Consult for Warfarin Indication: atrial fibrillation  No Known Allergies  Patient Measurements: Height: 5\' 8"  (172.7 cm) Weight: 218 lb (98.9 kg) IBW/kg (Calculated) : 68.4  Vital Signs: Temp: 98 F (36.7 C) (11/07 0600) BP: 99/53 (11/07 0600) Pulse Rate: 75 (11/06 2155)  Labs: Recent Labs    11/17/16 0640 11/17/16 2000 11/18/16 0448 11/19/16 0551  HGB  --  8.6*  --  8.6*  HCT  --  27.3*  --  27.4*  PLT  --  170  --  160  LABPROT 20.8*  --  21.3* 19.8*  INR 1.80  --  1.86 1.69  CREATININE  --  6.95*  --  5.36*    Estimated Creatinine Clearance: 11.7 mL/min (A) (by C-G formula based on SCr of 5.36 mg/dL (H)).   Assessment: 16 yoM transferred to CIR on 10/22 after a prolonged hospital stay that included CABG/MAZE with AKI and new ESRD. He has a h/o afib on apixaban PTA, which was switched to warfarin during the last admission.   INR 1.86 > 1.69 subtherapeutic and trending down, likely because of holding and lower dose last week d/t supratherapeutic INR, continue on amio, po intake slowly improving  Goal of Therapy:  INR 2-3 Monitor platelets by anticoagulation protocol: Yes   Plan:  Coumadin 5 mg po x 1 tonight (labile INR, probably no more than 2.5mg /day at discharge) Daily INR, monitor for s/sx of bleeding  Maryanna Shape, PharmD, BCPS  Clinical Pharmacist  Pager: [phone]  11/19/2016 9:33 AM

## 2016-11-19 NOTE — Progress Notes (Signed)
PT reports patient BP dropping and O2 dropping during therapy.  Checked BP manually 102/58.  Patient resting after therapy.  Says he feels tired.  Patient and family notified of medication change, neurontin stopped, lyrica started which could affect BP.  Primary RN notified.  Brita Romp, RN

## 2016-11-20 ENCOUNTER — Ambulatory Visit (HOSPITAL_COMMUNITY): Payer: Medicare Other | Admitting: Speech Pathology

## 2016-11-20 ENCOUNTER — Inpatient Hospital Stay (HOSPITAL_COMMUNITY): Payer: Medicare Other | Admitting: Physical Therapy

## 2016-11-20 ENCOUNTER — Inpatient Hospital Stay (HOSPITAL_COMMUNITY): Payer: Medicare Other | Admitting: Occupational Therapy

## 2016-11-20 ENCOUNTER — Encounter (HOSPITAL_COMMUNITY): Payer: Medicare Other | Admitting: Speech Pathology

## 2016-11-20 ENCOUNTER — Inpatient Hospital Stay (HOSPITAL_COMMUNITY): Payer: Medicare Other | Admitting: Speech Pathology

## 2016-11-20 ENCOUNTER — Inpatient Hospital Stay (HOSPITAL_COMMUNITY): Payer: Medicare Other

## 2016-11-20 DIAGNOSIS — Z7901 Long term (current) use of anticoagulants: Secondary | ICD-10-CM

## 2016-11-20 LAB — PROTIME-INR
INR: 2.24
PROTHROMBIN TIME: 24.6 s — AB (ref 11.4–15.2)

## 2016-11-20 LAB — GLUCOSE, CAPILLARY
GLUCOSE-CAPILLARY: 60 mg/dL — AB (ref 65–99)
GLUCOSE-CAPILLARY: 117 mg/dL — AB (ref 65–99)
GLUCOSE-CAPILLARY: 68 mg/dL (ref 65–99)
GLUCOSE-CAPILLARY: 92 mg/dL (ref 65–99)
Glucose-Capillary: 61 mg/dL — ABNORMAL LOW (ref 65–99)

## 2016-11-20 MED ORDER — LIDOCAINE HCL (PF) 1 % IJ SOLN: 5.0000 mL | INTRAMUSCULAR | Status: DC | PRN

## 2016-11-20 MED ORDER — DARBEPOETIN ALFA 200 MCG/0.4ML IJ SOSY
PREFILLED_SYRINGE | INTRAMUSCULAR | Status: AC
  Administered 2016-11-20: 200 ug via INTRAVENOUS
  Filled 2016-11-20: qty 0.4

## 2016-11-20 MED ORDER — PENTAFLUOROPROP-TETRAFLUOROETH EX AERO: 1.0000 "application " | INHALATION_SPRAY | CUTANEOUS | Status: DC | PRN

## 2016-11-20 MED ORDER — MIDODRINE HCL 5 MG PO TABS
ORAL_TABLET | ORAL | Status: AC
  Filled 2016-11-20: qty 4

## 2016-11-20 MED ORDER — SODIUM CHLORIDE 0.9 % IV SOLN: 100.0000 mL | INTRAVENOUS | Status: DC | PRN

## 2016-11-20 MED ORDER — LIDOCAINE-PRILOCAINE 2.5-2.5 % EX CREA: 1.0000 "application " | TOPICAL_CREAM | CUTANEOUS | Status: DC | PRN

## 2016-11-20 MED ORDER — ALTEPLASE 2 MG IJ SOLR: 2.0000 mg | Freq: Once | INTRAMUSCULAR | Status: DC | PRN

## 2016-11-20 MED ORDER — HEPARIN SODIUM (PORCINE) 1000 UNIT/ML DIALYSIS: 1000.0000 [IU] | INTRAMUSCULAR | Status: DC | PRN

## 2016-11-20 MED ORDER — MIDODRINE HCL 5 MG PO TABS
20.0000 mg | ORAL_TABLET | Freq: Three times a day (TID) | ORAL | Status: DC
  Administered 2016-11-20 – 2016-11-21 (×4): 20 mg via ORAL
  Filled 2016-11-20 (×2): qty 4

## 2016-11-20 MED ORDER — WARFARIN SODIUM 5 MG PO TABS
2.5000 mg | ORAL_TABLET | Freq: Once | ORAL | Status: AC
  Administered 2016-11-20: 2.5 mg via ORAL
  Filled 2016-11-20: qty 1

## 2016-11-20 MED ORDER — MIDODRINE HCL 5 MG PO TABS
ORAL_TABLET | ORAL | Status: AC
  Administered 2016-11-20: 20 mg via ORAL
  Filled 2016-11-20: qty 2

## 2016-11-20 MED ORDER — INSULIN DETEMIR 100 UNIT/ML ~~LOC~~ SOLN
15.0000 [IU] | Freq: Every day | SUBCUTANEOUS | Status: DC
  Filled 2016-11-20: qty 0.15

## 2016-11-20 MED ORDER — MIDODRINE HCL 5 MG PO TABS
ORAL_TABLET | ORAL | Status: AC
  Filled 2016-11-20: qty 2

## 2016-11-20 NOTE — Progress Notes (Signed)
Social Work Patient ID: [name], male   DOB: [birth year], 83 y.o.   MRN: [id number] Pt now needs a rolling walker aware will be private pay. Have made referral to [facility] to deliver in am.

## 2016-11-20 NOTE — Plan of Care (Signed)
  Progressing RH BOWEL ELIMINATION RH STG MANAGE BOWEL WITH ASSISTANCE Description STG Manage Bowel with Mod Assistance.  11/20/2016 0018 - Progressing by Edd Arbour, RN RH SKIN INTEGRITY RH STG MAINTAIN SKIN INTEGRITY WITH ASSISTANCE Description STG Maintain Skin Integrity With mod Assistance.  11/20/2016 0018 - Progressing by Edd Arbour, RN RH SAFETY RH STG ADHERE TO SAFETY PRECAUTIONS W/ASSISTANCE/DEVICE Description STG Adhere to Safety Precautions With min Assistance/Device.  11/20/2016 0018 - Progressing by Edd Arbour, RN RH PAIN MANAGEMENT RH STG PAIN MANAGED AT OR BELOW PT'S PAIN GOAL Description <4 out of 10.   11/20/2016 0018 - Progressing by Edd Arbour, RN

## 2016-11-20 NOTE — Progress Notes (Signed)
Modified Barium Swallow Progress Note  Patient Details  Name: SUVAN STCYR MRN: [id number] Date of Birth: 01/22/1930  Today's Date: 11/20/2016  Modified Barium Swallow completed.  Full report located under Chart Review in the Imaging Section.  Brief recommendations include the following:  Clinical Impression      Pt presents with modest improvements in swallowing function in comparison to last week.  Pt continues to have penetration with both nectar and thin liquids which clears more completely from the laryngeal vestibule with thickened liquids.  However, penetration subjectively appears decreased in frequency and intensity in comparison to previous study.  Despite slight improvements in function, do not recommend further diet advancement at this time given pt's tenuous medical state and debility.  Pt may have sips of water in between meals following oral care to continue working towards further diet progression.       Swallow Evaluation Recommendations       SLP Diet Recommendations:Regular solids;Nectar thick liquid;Free water protocol after oral care       Medication Administration: Whole meds with puree   Supervision: Patient able to self feed;Full supervision/cueing for compensatory strategies   Compensations: Slow rate;Small sips/bites;Hard cough after swallow;Multiple dry swallows after each bite/sip   Postural Changes: Seated upright at 90 degrees            Trevaun Rendleman, Elmyra Ricks L 11/20/2016,4:24 PM

## 2016-11-20 NOTE — Progress Notes (Signed)
ANTICOAGULATION CONSULT NOTE -Follow up Pharmacy Consult for Warfarin Indication: atrial fibrillation  No Known Allergies  Patient Measurements: Height: 5\' 8"  (172.7 cm) Weight: 224 lb (101.6 kg) IBW/kg (Calculated) : 68.4  Vital Signs: Temp: 98 F (36.7 C) (11/08 1320) Temp Source: Oral (11/08 1320) BP: 125/78 (11/08 1320) Pulse Rate: 83 (11/08 1320)  Labs: Recent Labs    11/17/16 2000 11/18/16 0448 11/19/16 0551 11/20/16 0622  HGB 8.6*  --  8.6*  --   HCT 27.3*  --  27.4*  --   PLT 170  --  160  --   LABPROT  --  21.3* 19.8* 24.6*  INR  --  1.86 1.69 2.24  CREATININE 6.95*  --  5.36*  --     Estimated Creatinine Clearance: 11.9 mL/min (A) (by C-G formula based on SCr of 5.36 mg/dL (H)).   Assessment: 29 yoM transferred to CIR on 10/22 after a prolonged hospital stay that included CABG/MAZE with AKI and new ESRD. He has a h/o afib on apixaban PTA, which was switched to warfarin during the last admission.   INR 2.24 today, therapeutic.  Prior subtherapeutic INR values likely because of holding and lower dose last week d/t supratherapeutic INR, drug interaction with continued amiodarone. PO intake improving.  H/H low/stable and pltc wnl but trending down. No bleeding noted.  Goal of Therapy:  INR 2-3 Monitor platelets by anticoagulation protocol: Yes   Plan:  Coumadin 2.5 mg po x 1 tonight (labile INR, probably no more than 2.5mg /day at discharge) Daily INR, monitor for s/sx of bleeding  Nicole Cella, RPh Clinical Pharmacist 8a-330p [phone] 330p-1030p phone [phone] or [phone] Main pharmacy [phone] 11/20/2016 2:16 PM

## 2016-11-20 NOTE — Progress Notes (Signed)
Occupational Therapy Session Note  Patient Details  Name: [name] MRN: [id number] Date of Birth: 02/26/1930  Today's Date: 11/20/2016 OT Individual Time: 0732-0830 OT Individual Time Calculation (min): 58 min    Short Term Goals: Week 2:  OT Short Term Goal 1 (Week 2): Continue working on established supervision level goals for discharge.  Skilled Therapeutic Interventions/Progress Updates:    Session focus on ADL self care retraining, activity tolerance, functional mobility transfers. Pt transfers from EOB to w/c with MinA, positions w/c in front of sink for morning ADLs. Pt completed UB/LB bathing and dressing ADLs from w/c level at sink with overall supervision level, supervision for sit<>stand throughout. Pt does require MinA for peri-care as Pt noted to have had BM during task completion. Pt requires intermittent seated rest breaks during task completion, initiating as needed. Pt completes brief room level functional mobility at RW level to transfer to toilet with supervision during sit<>stand and mobility, one instance of MinA to rise from regular heigh toilet (vs BSC). O2 sats remaining above 90% on RA during session. Pt left supine in bed, bed alarm activated, call bell and needs within reach.   Therapy Documentation Precautions:  Precautions Precautions: Fall, Sternal Restrictions Weight Bearing Restrictions: No RUE Weight Bearing: Weight bearing as tolerated  Pain: Pain Assessment Pain Assessment: Faces Faces Pain Scale: No hurt ADL: ADL ADL Comments: Please see functional navigator  See Function Navigator for Current Functional Status.   Therapy/Group: Individual Therapy  Raymondo Band 11/20/2016, 3:42 PM

## 2016-11-20 NOTE — Progress Notes (Signed)
Patient sent to Dialysis and brought back without dialyzing. Patient will be rescheduled for dialysis tomorrow due to scheduling issue.    Fredna Dow M

## 2016-11-20 NOTE — Progress Notes (Signed)
Speech Language Pathology Discharge Summary  Patient Details  Name: [name] MRN: [id number] Date of Birth: 11-11-1930  Today's Date: 11/20/2016 SLP Individual Time: 1430-1500 SLP Individual Time Calculation (min): 30 min   Skilled Therapeutic Interventions:  Pt was seen for skilled ST targeting completion of family education prior to discharge home tomorrow.  Pt's wife and daughter were present during today's therapy session and were updated regarding results of most recent MBS.  Therapist demonstrated how to thicken liquids to the appropriate viscosity and provided education regarding dietary restrictions for individuals on thickened liquids such as no milkshakes, ice creams, or Jello.  Discussed that pt may have whatever foods he likes but all liquids other than water outside of meals must be thickened to nectar thick consistency.  Discussed rationale for and parameters of the water protocol.  Therapist also reviewed swallowing and EMST exercises, including frequency and duration.  All recommendations were provided on a handout and give to pt's daughter to maximize carryover in the home environment.  All questions were answered to family's satisfaction at this time.  Pt is ready for discharge tomorrow.      Patient has met 3 of 4 long term goals.  Patient to discharge at overall Modified Independent;Supervision level.  Reasons goals not met:     Clinical Impression/Discharge Summary:  Pt has made slow functional gains while inpatient and is discharging having met 3 out of 4 long term goals.  Pt's diet has been advanced to regular textures and nectar thick liquids with regular water allowed between meals after oral care per the water protocol.  Pt may need a repeat MBS prior to advancement given sensory deficits noted on MBS; however, pt may be able to be advanced gradually at bedside should his respiratory status and endurance improve.  Pt has mild memory deficits and he at times needs min  cues for recall of new information which SLP is hopeful will improve as pt's endurance increases and pt returns to a more familiar environment.  Pt and family education is complete at this time.  Pt is discharging home with 24/7 supervision from family in addition to [place] follow up at next level of care.     Care Partner:  Caregiver Able to Provide Assistance: Yes  Type of Caregiver Assistance: Physical;Cognitive  Recommendation:  24 hour supervision/assistance;Home Health SLP;Outpatient SLP  Rationale for SLP Follow Up: Reduce caregiver burden;Maximize cognitive function and independence;Maximize swallowing safety   Equipment: thickener   Reasons for discharge: Discharged from hospital   Patient/Family Agrees with Progress Made and Goals Achieved: Yes   Function:  Eating Eating   Modified Consistency Diet: Yes Eating Assist Level: More than reasonable amount of time;Set up assist for   Eating Set Up Assist For: Opening containers       Cognition Comprehension Comprehension assist level: Follows complex conversation/direction with no assist  Expression   Expression assist level: Expresses complex ideas: With no assist  Social Interaction Social Interaction assist level: Interacts appropriately with others - No medications needed.  Problem Solving Problem solving assist level: Solves basic problems with no assist  Memory Memory assist level: Recognizes or recalls 90% of the time/requires cueing < 10% of the time   Emilio Math 11/20/2016, 4:28 PM

## 2016-11-20 NOTE — Progress Notes (Signed)
[facility] Progress Note   Assessment/ Plan:   1 ESRD-Patient was not able to get dialysis yesterday due to scheduling conflict. Patient is scheduled for a HD session today and one early  Tomorrow morning prior to discharge. Given issues with BP in the past requiring additional dose of midodrine, dosage will be adjusted prior to discharge on 11/9. Clinically patient is at his baseline with no sign of uremia. Patient will start outpatient dialysis on 11/13. TTS schedule as OP, has been CLIP'd to [facility]. Patient will likely need adjustment in midodrine dosage in the outpatient setting. -- Midodrine has been increased from 10 to 20 mg tid  2 Hypertension/ volume:  This am 102/54 . Currently on midodrine midodrine 10 mg TID increase to 20 mg today.   3 CAD- s/p CABG/Maze/Afib  On warfarin. INR 2.24 and at goal.Continue management per pharmacy. Goal 2-3. Last platelet count 170 on 11/5 --Follow up on am CBC and BMP --Continue amiodarone.  4 Deconditioning and debility- in CIR. Patient is tolerating therapy and progression has been appropriate. Anticipate discharge tomorrow 11/9.  5 DM- Per primary  6 Anemia- Last Hgb 8.6 (11/2), on Aranesp 200 mcg q Wednesday and ferrlicet 166 mcg x 8 doses, started 10/26. --Follow up on am CBC  7 Bone/ mineral: Phos 6.5, add velphoro 500 mg TID with meals, calcitriol   8.  L hand numbness--> 3rd-5th fingers of R hand.  Hand well-perfused, Ulnar neuropathy vs mild steal. --Consider VVS consult, for now will continue to monitor.  9 Dispo: Anticipate discharge from CIR on 11/9.    Subjective:    No acute event overnight. Patient reports that he was not able to get dialysis yesterday. Patient is ready for discharge as he has had a prolonged hospitalization.   Objective:   BP (!) 102/54 (BP Location: Left Arm)   Pulse 92   Temp 97.6 F (36.4 C) (Oral)   Resp 18   Ht 5\' 8"  (1.727 m)   Wt 224 lb (101.6 kg)   SpO2 100%   BMI 34.06  kg/m   Physical Exam: General appearance: NAD Resp: Rhonchi heard bilaterally upper and lower lung fields Chest wall: RIJ tunneled HD cath Cardio: regular rate and rhythm, S1, S2 normal  GI: mild distension, decreased bs, nontender Extremities: 1+ edema, AVF RUA with +T/B, R hand well-perfused.  Peeling skin bilateral hands.  Labs: BMET Recent Labs  Lab 11/14/16 1842 11/17/16 2000 11/19/16 0551  NA 129* 132* 133*  K 4.4 5.1 5.1  CL 95* 95* 95*  CO2 24 23 28   GLUCOSE 201* 102* 90  BUN 40* 54* 37*  CREATININE 5.47* 6.95* 5.36*  CALCIUM 8.0* 8.1* 8.0*  PHOS 2.9 2.7  --    CBC Recent Labs  Lab 11/14/16 1842 11/17/16 2000 11/19/16 0551  WBC 11.9* 13.6* 11.0*  HGB 8.6* 8.6* 8.6*  HCT 27.5* 27.3* 27.4*  MCV 92.9 92.2 92.9  PLT 189 170 160    @IMGRELPRIORS @ Medications:    . amiodarone  200 mg Oral Q12H  . aspirin  81 mg Per Tube Daily  . atorvastatin  40 mg Oral q1800  . darbepoetin (ARANESP) injection - DIALYSIS  200 mcg Intravenous Q Thu-HD  . diclofenac sodium  2 g Topical QID  . feeding supplement (GLUCERNA SHAKE)  237 mL Oral BID BM  . finasteride  5 mg Oral QHS  . Gerhardt's butt cream   Topical BID  . guaiFENesin  600 mg Oral BID  .  hydrocortisone cream   Topical BID  . insulin aspart  0-15 Units Subcutaneous TID WC  . [START ON 11/21/2016] insulin detemir  15 Units Subcutaneous Daily  . levothyroxine  25 mcg Per Tube QAC breakfast  . lidocaine  1 patch Transdermal Q24H  . midodrine  10 mg Oral TID WC  . multivitamin  1 tablet Oral QHS  . nystatin cream   Topical BID  . pantoprazole  40 mg Oral Daily  . predniSONE  5 mg Oral Q breakfast  . pregabalin  25 mg Oral Daily  . tamsulosin  0.4 mg Oral QHS  . Warfarin - Pharmacist Dosing Inpatient   Does not apply Sarah Ann, MD [place], PGY-2  11/20/2016, 11:00 AM

## 2016-11-20 NOTE — Progress Notes (Signed)
[place] PHYSICAL MEDICINE & REHABILITATION     PROGRESS NOTE  Subjective/Complaints:  Patient seen sitting up in his bed this morning. He states he slept well overnight. He states that elbow does not have this morning, but now states that his hand is uncomfortable. He states that he believes it is his arthritis.  ROS: + Right hand pain. Denies nausea, vomiting, diarrhea, shortness of breath or chest pain   Objective: Vital Signs: Blood pressure (!) 102/54, pulse 92, temperature 97.6 F (36.4 C), temperature source Oral, resp. rate 18, height 5\' 8"  (1.727 m), weight 101.6 kg (224 lb), SpO2 100 %. No results found. Recent Labs    11/17/16 2000 11/19/16 0551  WBC 13.6* 11.0*  HGB 8.6* 8.6*  HCT 27.3* 27.4*  PLT 170 160   Recent Labs    11/17/16 2000 11/19/16 0551  NA 132* 133*  K 5.1 5.1  CL 95* 95*  GLUCOSE 102* 90  BUN 54* 37*  CREATININE 6.95* 5.36*  CALCIUM 8.1* 8.0*   CBG (last 3)  Recent Labs    11/20/16 0623 11/20/16 0643 11/20/16 0710  GLUCAP 60* 68 117*    Wt Readings from Last 3 Encounters:  11/20/16 101.6 kg (224 lb)  11/03/16 92.6 kg (204 lb 2.3 oz)  10/06/16 99 kg (218 lb 5 oz)    Physical Exam:  BP (!) 102/54 (BP Location: Left Arm)   Pulse 92   Temp 97.6 F (36.4 C) (Oral)   Resp 18   Ht 5\' 8"  (1.727 m)   Wt 101.6 kg (224 lb)   SpO2 100%   BMI 34.06 kg/m  Constitutional: He appears well-developed. NAD. Frail  HENT: Normocephalic and atraumatic.  Eyes: EOM are normal. No discharge.  Cardiovascular: RRR. No JVD. Respiratory: +Clear.  Normal effort  GI: BS +, non-distended   Musculoskeletal: He exhibits no edema. No tenderness. Neurological:  Alert and oriented Motor: 4+/5 B/l UE  4-/5 b/l LE (upper extremities stronger than lower extremities, stable) Skin: Skin is warm and dry.  Psychiatric: Normal mood and behavior.  Assessment/Plan: 1. Functional deficits secondary to debility which require 3+ hours per day of interdisciplinary  therapy in a comprehensive inpatient rehab setting. Physiatrist is providing close team supervision and 24 hour management of active medical problems listed below. Physiatrist and rehab team continue to assess barriers to discharge/monitor patient progress toward functional and medical goals.  Function:  Bathing Bathing position   Position: Wheelchair/chair at sink  Bathing parts Body parts bathed by patient: Left lower leg, Right lower leg, Right upper leg, Left upper leg(Pt washed UB prior to session per his report) Body parts bathed by helper: Back  Bathing assist Assist Level: Supervision or verbal cues      Upper Body Dressing/Undressing Upper body dressing   What is the patient wearing?: Pull over shirt/dress     Pull over shirt/dress - Perfomed by patient: Thread/unthread left sleeve, Put head through opening, Thread/unthread right sleeve, Pull shirt over trunk Pull over shirt/dress - Perfomed by helper: Pull shirt over trunk        Upper body assist Assist Level: Supervision or verbal cues      Lower Body Dressing/Undressing Lower body dressing   What is the patient wearing?: Non-skid slipper socks, Ted Hose     Pants- Performed by patient: Thread/unthread right pants leg, Thread/unthread left pants leg, Pull pants up/down Pants- Performed by helper: Pull pants up/down Non-skid slipper socks- Performed by patient: Don/doff right sock, Don/doff left sock  Non-skid slipper socks- Performed by helper: Don/doff right sock, Don/doff left sock               TED Hose - Performed by helper: Don/doff right TED hose, Don/doff left TED hose  Lower body assist Assist for lower body dressing: Supervision or verbal cues      Toileting Toileting Toileting activity did not occur: No continent bowel/bladder event   Toileting steps completed by helper: Adjust clothing prior to toileting, Performs perineal hygiene, Adjust clothing after toileting Toileting Assistive Devices:  Grab bar or rail  Toileting assist Assist level: Supervision or verbal cues   Transfers Chair/bed transfer   Chair/bed transfer method: Stand pivot Chair/bed transfer assist level: Touching or steadying assistance (Pt > 75%) Chair/bed transfer assistive device: Medical sales representative Ambulation activity did not occur: Refused   Max distance: 55 ft Assist level: Touching or steadying assistance (Pt > 75%)   Wheelchair Wheelchair activity did not occur: Refused Type: Manual      Cognition Comprehension Comprehension assist level: Follows complex conversation/direction with no assist  Expression Expression assist level: Expresses complex ideas: With no assist  Social Interaction Social Interaction assist level: Interacts appropriately with others - No medications needed.  Problem Solving Problem solving assist level: Solves basic 90% of the time/requires cueing < 10% of the time  Memory Memory assist level: Recognizes or recalls 90% of the time/requires cueing < 10% of the time    Medical Problem List and Plan: 1.  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 Patient has sternal precautions  Cont CIR, plan DC tomorrow 2.  DVT Prophylaxis/Anticoagulation: Chronic Coumadin therapy for history of PAF   INR therapeutic on 11/8, after discussion with pharmacy 3. Pain Management: Neurontin 300 mg twice a day, Ultram as needed 4. Mood: Provide emotional support 5. Neuropsych: This patient is capable of making decisions on his own behalf. 6. Skin/Wound Care: Routine skin checks  Nystatin cream ordered for fungal rash. 7. Fluids/Electrolytes/Nutrition: Routine I&Os 8.AKI/CKD stage 3-4. Follow-up renal services. Presently receiving hemodialysis Monday Wednesday Friday schedule monitoring for recovery of renal function.S/P right IJ tunneled dialysis catheter/right brachiocephalic AV fistula 76/16/0737  Appreciate Neprho recs, cont HD per Nephro 9. Acute on  chronic anemia. Continue iron supplement  Hb 8.6 on 11/7  Cont to monitor 10. Dysphagia. Dysphagia #3 nectar liquids. Monitor hydration.   Advance diet as tolerated 11. Chronic diastolic congestive heart failure. Monitor for any signs of fluid overload.  Filed Weights   11/17/16 2300 11/19/16 0700 11/20/16 0401  Weight: 99.3 kg (219 lb) 98.9 kg (218 lb) 101.6 kg (224 lb)   Unreliable  CXR reviewed, suggesting CHF  Not requiring supplemental Oxygen anymore, despite requiring 2 L PTA 12. PAF. Chronic Coumadin, Lopressor 12.5 mg twice a day, amiodarone 200 mg every 12 hours, Midodrine 10 mg 3 times a day  Appreciate cards recs, notes reviewed 13. Diabetes mellitus and peripheral neuropathy. Hemoglobin A1c 7.2. Check blood sugars before meals and at bedtime.  Reduced levemir to 20 u daily on 10/28, decreased to 15 units on 11/8  Will maintain SSI given initiation of steroids  Hypoglycemiathis AM 14. BPH. Proscar 5 mg daily, Flomax 0.4 mg daily 15. Hyperlipidemia. Lipitor 16. Hypothyroidism. Synthroid 17.  Hypotension   See  above  Related to HD.  Fluid management per nephro. 18.  RA  Monthly infusion PTA.    Discussed with pharmacy home medication/dosage, likely on orencia, not due until later this month, plan to  resume his outpatient  Voltaren GEL ordered with improvement  Lidoderm patch also ordered on 10/31  Prednisone 5mg  started on 11/1 19. Leukocytosis, persistent  WBCs 11.0 on 11/7, labs with HD  Repeat CXR reviewed, slight improvement  Continue to monitor  Afebrile  20. Ulnar neuropathy vs steal syndrome vs RA  Gabapentin d/ced, lyrica 25mg  daily with 25 post-HD started on 11/7  Per Vasc, cont to follow, likely not related to fistula  LOS (Days) 17 A FACE TO FACE EVALUATION WAS PERFORMED  Ankit Lorie Phenix 11/20/2016 8:22 AM

## 2016-11-20 NOTE — Discharge Summary (Signed)
Discharge summary job [phone]

## 2016-11-20 NOTE — Progress Notes (Signed)
Physical Therapy Discharge Summary  Patient Details  Name: [name] MRN: [id number] Date of Birth: 09-Apr-1930  Today's Date: 11/20/2016 PT Individual Time: 1100-1200 AND 1510-1535 PT Individual Time Calculation (min): 60 min AND 25 min  Session 1:  Pt supine upon arrival and agreeable to therapy, no c/o pain. Worked on functional mobility and endurance as detailed below in discharge section. Pt required many seated rest breaks 2/2 fatigue and increased work of breathing. Pt on room air throughout session, no signs or symptoms of O2 desat. Performed NuStep L3 5 min x2 to work on endurance and functional LE strengthening. Returned to room and ended session in w/c, call bell within reach and all needs met.   Session 2: Pt supine upon arrival and agreeable to therapy, no c/o pain. Transferred to EOB w/ Min A and performed sit<>stand w/ supervision and PT provided Total A for donning LE garments. Pt sitting EOB while PT, patient, and family discussed physical assistance needed at home. Daughter and wife present and verbalized understanding that pt would require 24/7 supervision at first, especially since pt's wife has cognitive deficits. Both verbalized understanding of sternal precautions and in agreement to provide close supervision to Min A w/ all mobility until pt's strength and endurance has increased. Instructed on log-roll technique for bed mobility and providing assistance at trunk/back of pants for sit<>stand, although pt only needs physical assistance occasionally w/ sit<>stands. Pt leaving for dialysis at end of session, family had no further questions.   Patient has met 7 of 7 long term goals due to improved activity tolerance, improved balance and increased strength.  W/c goal marked not applicable 2/2 sternal precautions preventing self-propulsion. Patient to discharge at an ambulatory level Supervision.   Patient's care partner requires assistance to provide the necessary physical  assistance at discharge 2/2 memory issues and decreased physical capacity. Pt's children aware and planning to provide necessary physical assistance at discharge.   Reasons goals not met: n/a  Recommendation:  Patient will benefit from ongoing skilled PT services in home health setting to continue to advance safe functional mobility, address ongoing impairments in cardiorespiratory endurance, functional strength, balance, and minimize fall risk.  Equipment: W/c and RW  Reasons for discharge: treatment goals met and discharge from hospital  Patient/family agrees with progress made and goals achieved: Yes  PT Discharge Precautions/Restrictions Precautions Precautions: Fall;Sternal Restrictions Weight Bearing Restrictions: No RUE Weight Bearing: Weight bearing as tolerated Pain Pain Assessment Pain Assessment: 0-10 Pain Score: 5  Pain Location: Wrist Pain Orientation: Right Pain Descriptors / Indicators: Aching Pain Onset: On-going Vision/Perception  Perception Perception: Within Functional Limits Praxis Praxis: Intact  Cognition Overall Cognitive Status: Impaired/Different from baseline Arousal/Alertness: Awake/alert Orientation Level: Oriented X4 Sustained Attention: Appears intact Memory: Impaired Memory Impairment: Storage deficit;Decreased recall of new information Awareness: Appears intact Problem Solving: Impaired Safety/Judgment: Impaired Comments: Pt continues to need mod instructional cueing for hand positioning during sit to stand and stand to sit transitions.   Sensation Sensation Light Touch: Appears Intact Proprioception: Appears Intact Coordination Gross Motor Movements are Fluid and Coordinated: Yes Fine Motor Movements are Fluid and Coordinated: Yes Motor  Motor Motor: Other (comment) Motor - Discharge Observations: generalized weakness  Mobility Bed Mobility Bed Mobility: Rolling Right;Supine to Sit;Rolling Left;Sit to Supine Rolling Right: 5:  Supervision Rolling Right Details: Verbal cues for technique Rolling Left: 5: Supervision Rolling Left Details: Verbal cues for technique Supine to Sit: 4: Min assist Supine to Sit Details: Verbal cues for technique;Manual facilitation for  placement Sit to Supine: 4: Min assist Sit to Supine - Details: Manual facilitation for weight shifting;Verbal cues for precautions/safety;Verbal cues for technique Transfers Transfers: Yes Sit to Stand: 5: Supervision Sit to Stand Details: Verbal cues for technique;Verbal cues for precautions/safety Stand to Sit: 5: Supervision Stand to Sit Details (indicate cue type and reason): Verbal cues for technique;Verbal cues for precautions/safety Stand Pivot Transfers: 5: Supervision Stand Pivot Transfer Details: Verbal cues for technique;Verbal cues for precautions/safety Locomotion  Ambulation Ambulation: Yes Ambulation/Gait Assistance: 5: Supervision Assistive device: Rolling walker Ambulation/Gait Assistance Details: Verbal cues for safe use of DME/AE Gait Gait: Yes Gait Pattern: Impaired Gait Pattern: Trunk flexed;Poor foot clearance - left;Poor foot clearance - right;Narrow base of support;Decreased stride length Gait velocity: decreased Stairs / Additional Locomotion Stairs: Yes Stairs Assistance: 4: Min guard Stair Management Technique: Two rails;Step to pattern Number of Stairs: 4 Height of Stairs: 6 Wheelchair Mobility Wheelchair Mobility: Yes Wheelchair Assistance: Not tested (comment)(Pt has sternal precautions preventing self-propulsion, unable to provide appropriate fitting w/c that LEs can reach ground in.)  Trunk/Postural Assessment  Cervical Assessment Cervical Assessment: Within Functional Limits Thoracic Assessment Thoracic Assessment: Within Functional Limits Lumbar Assessment Lumbar Assessment: Within Functional Limits Postural Control Postural Control: Within Functional Limits  Balance Balance Balance Assessed:  Yes Static Sitting Balance Static Sitting - Balance Support: No upper extremity supported;Feet supported Static Sitting - Level of Assistance: 7: Independent Dynamic Sitting Balance Dynamic Sitting - Balance Support: During functional activity;No upper extremity supported;Feet supported Dynamic Sitting - Level of Assistance: 5: Stand by assistance Static Standing Balance Static Standing - Balance Support: During functional activity;Bilateral upper extremity supported Static Standing - Level of Assistance: 5: Stand by assistance Dynamic Standing Balance Dynamic Standing - Balance Support: During functional activity Dynamic Standing - Level of Assistance: 4: Min assist Extremity Assessment  RLE Assessment RLE Assessment: Within Functional Limits LLE Assessment LLE Assessment: Within Functional Limits   See Function Navigator for Current Functional Status.  Kamaree Wheatley K Arnette 11/20/2016, 11:17 AM

## 2016-11-20 NOTE — Plan of Care (Signed)
  Progressing RH BOWEL ELIMINATION RH STG MANAGE BOWEL WITH ASSISTANCE Description STG Manage Bowel with Mod Assistance.  11/20/2016 1300 - Progressing by Marney Setting, RN RH SKIN INTEGRITY RH STG SKIN FREE OF INFECTION/BREAKDOWN Description No new breakdown with mod assist    11/20/2016 1300 - Progressing by [place]ney Setting, RN RH STG MAINTAIN SKIN INTEGRITY WITH ASSISTANCE Description STG Maintain Skin Integrity With mod Assistance.  11/20/2016 1300 - Progressing by Marney Setting, RN RH STG ABLE TO PERFORM INCISION/WOUND CARE W/ASSISTANCE Description STG Able To Perform Incision/Wound Care With mod Assistance.  11/20/2016 1300 - Progressing by Marney Setting, RN RH SAFETY RH STG ADHERE TO SAFETY PRECAUTIONS W/ASSISTANCE/DEVICE Description STG Adhere to Safety Precautions With min Assistance/Device.  11/20/2016 1300 - Progressing by Marney Setting, RN RH PAIN MANAGEMENT RH STG PAIN MANAGED AT OR BELOW PT'S PAIN GOAL Description <4 out of 10.   11/20/2016 1300 - Progressing by Marney Setting, RN Consults [facility] GENERAL PATIENT EDUCATION Description See Patient Education module for education specifics.  11/20/2016 1300 - Progressing by Marney Setting, RN

## 2016-11-20 NOTE — Significant Event (Signed)
Hypoglycemic Event  CBG: 60  Treatment: 15 GM carbohydrate snack  Symptoms: None  Follow-up CBG: [id number] CBG Result:68  Possible Reasons for Event: Inadequate meal intake  Comments/MD notified: Marlowe Shores, PA    Edd Arbour

## 2016-11-21 LAB — CBC
HCT: 27.7 % — ABNORMAL LOW (ref 39.0–52.0)
Hemoglobin: 8.6 g/dL — ABNORMAL LOW (ref 13.0–17.0)
MCH: 29.1 pg (ref 26.0–34.0)
MCHC: 31 g/dL (ref 30.0–36.0)
MCV: 93.6 fL (ref 78.0–100.0)
Platelets: 171 10*3/uL (ref 150–400)
RBC: 2.96 MIL/uL — ABNORMAL LOW (ref 4.22–5.81)
RDW: 20.6 % — ABNORMAL HIGH (ref 11.5–15.5)
WBC: 11.7 10*3/uL — ABNORMAL HIGH (ref 4.0–10.5)

## 2016-11-21 LAB — RENAL FUNCTION PANEL
Albumin: 2.2 g/dL — ABNORMAL LOW (ref 3.5–5.0)
Anion gap: 10 (ref 5–15)
BUN: 18 mg/dL (ref 6–20)
CO2: 28 mmol/L (ref 22–32)
Calcium: 8.1 mg/dL — ABNORMAL LOW (ref 8.9–10.3)
Chloride: 96 mmol/L — ABNORMAL LOW (ref 101–111)
Creatinine, Ser: 3.7 mg/dL — ABNORMAL HIGH (ref 0.61–1.24)
GFR calc Af Amer: 16 mL/min — ABNORMAL LOW (ref >60)
GFR calc non Af Amer: 14 mL/min — ABNORMAL LOW (ref >60)
Glucose, Bld: 88 mg/dL (ref 65–99)
Phosphorus: 2.8 mg/dL (ref 2.5–4.6)
Potassium: 4.5 mmol/L (ref 3.5–5.1)
Sodium: 134 mmol/L — ABNORMAL LOW (ref 135–145)

## 2016-11-21 LAB — GLUCOSE, CAPILLARY
GLUCOSE-CAPILLARY: 107 mg/dL — AB (ref 65–99)
Glucose-Capillary: 135 mg/dL — ABNORMAL HIGH (ref 65–99)
Glucose-Capillary: 57 mg/dL — ABNORMAL LOW (ref 65–99)
Glucose-Capillary: 97 mg/dL (ref 65–99)
Glucose-Capillary: 95 mg/dL (ref 65–99)

## 2016-11-21 LAB — PROTIME-INR
INR: 2.84
Prothrombin Time: 29.6 seconds — ABNORMAL HIGH (ref 11.4–15.2)

## 2016-11-21 MED ORDER — LEVOTHYROXINE SODIUM 25 MCG PO TABS: 25.0000 ug | ORAL_TABLET | Freq: Every day | ORAL | 0 refills | Status: AC

## 2016-11-21 MED ORDER — HEPARIN SODIUM (PORCINE) 1000 UNIT/ML DIALYSIS: 1000.0000 [IU] | INTRAMUSCULAR | Status: DC | PRN

## 2016-11-21 MED ORDER — MIDODRINE HCL 10 MG PO TABS: 20.0000 mg | ORAL_TABLET | Freq: Three times a day (TID) | ORAL | 0 refills | Status: AC

## 2016-11-21 MED ORDER — LIDOCAINE-PRILOCAINE 2.5-2.5 % EX CREA: 1.0000 "application " | TOPICAL_CREAM | CUTANEOUS | Status: DC | PRN

## 2016-11-21 MED ORDER — FINASTERIDE 5 MG PO TABS: 5.0000 mg | ORAL_TABLET | Freq: Every day | ORAL | 0 refills | Status: AC

## 2016-11-21 MED ORDER — DICLOFENAC SODIUM 1 % TD GEL: 2.0000 g | Freq: Four times a day (QID) | TRANSDERMAL | 1 refills | Status: AC

## 2016-11-21 MED ORDER — SODIUM CHLORIDE 0.9 % IV SOLN: 100.0000 mL | INTRAVENOUS | Status: DC | PRN

## 2016-11-21 MED ORDER — INSULIN DETEMIR 100 UNIT/ML FLEXPEN: 15.0000 [IU] | Freq: Every day | SUBCUTANEOUS | 0 refills | Status: AC

## 2016-11-21 MED ORDER — GABAPENTIN 300 MG PO CAPS: 300.0000 mg | ORAL_CAPSULE | Freq: Two times a day (BID) | ORAL | 0 refills | Status: AC

## 2016-11-21 MED ORDER — LIDOCAINE 5 % EX PTCH: 1.0000 | MEDICATED_PATCH | CUTANEOUS | 0 refills | Status: AC

## 2016-11-21 MED ORDER — TAMSULOSIN HCL 0.4 MG PO CAPS: 0.4000 mg | ORAL_CAPSULE | Freq: Every day | ORAL | 0 refills | Status: AC

## 2016-11-21 MED ORDER — FLUTICASONE FUROATE-VILANTEROL 100-25 MCG/INH IN AEPB: 1.0000 | INHALATION_SPRAY | Freq: Every day | RESPIRATORY_TRACT | 5 refills | Status: AC

## 2016-11-21 MED ORDER — PREDNISONE 5 MG PO TABS: 5.0000 mg | ORAL_TABLET | Freq: Every day | ORAL | 0 refills | Status: AC

## 2016-11-21 MED ORDER — ATORVASTATIN CALCIUM 40 MG PO TABS: 40.0000 mg | ORAL_TABLET | Freq: Every day | ORAL | 0 refills | Status: AC

## 2016-11-21 MED ORDER — RESOURCE THICKENUP CLEAR PO POWD: 1.0000 | ORAL | 1 refills | Status: AC | PRN

## 2016-11-21 MED ORDER — LIDOCAINE HCL (PF) 1 % IJ SOLN: 5.0000 mL | INTRAMUSCULAR | Status: DC | PRN

## 2016-11-21 MED ORDER — AMIODARONE HCL 200 MG PO TABS
200.0000 mg | ORAL_TABLET | Freq: Every day | ORAL | Status: DC
  Administered 2016-11-21: 200 mg via ORAL
  Filled 2016-11-21: qty 1

## 2016-11-21 MED ORDER — WARFARIN SODIUM 2.5 MG PO TABS: 2.5000 mg | ORAL_TABLET | Freq: Every day | ORAL | 0 refills | Status: AC

## 2016-11-21 MED ORDER — PENTAFLUOROPROP-TETRAFLUOROETH EX AERO: 1.0000 "application " | INHALATION_SPRAY | CUTANEOUS | Status: DC | PRN

## 2016-11-21 MED ORDER — RANITIDINE HCL 150 MG PO TABS: 150.0000 mg | ORAL_TABLET | Freq: Every day | ORAL | 0 refills | Status: AC

## 2016-11-21 MED ORDER — AMIODARONE HCL 200 MG PO TABS: 200.0000 mg | ORAL_TABLET | Freq: Two times a day (BID) | ORAL | Status: DC

## 2016-11-21 MED ORDER — ALTEPLASE 2 MG IJ SOLR: 2.0000 mg | Freq: Once | INTRAMUSCULAR | Status: DC | PRN

## 2016-11-21 MED ORDER — MIDODRINE HCL 5 MG PO TABS
ORAL_TABLET | ORAL | Status: AC
Start: 2016-11-21 — End: 2016-11-21
  Filled 2016-11-21: qty 4

## 2016-11-21 MED ORDER — GERHARDT'S BUTT CREAM: 1.0000 "application " | TOPICAL_CREAM | Freq: Two times a day (BID) | CUTANEOUS | 0 refills | Status: AC

## 2016-11-21 MED ORDER — GUAIFENESIN 100 MG/5ML PO SOLN: 20.0000 mL | ORAL | 0 refills | Status: AC | PRN

## 2016-11-21 MED ORDER — AMIODARONE HCL 200 MG PO TABS: 200.0000 mg | ORAL_TABLET | Freq: Every day | ORAL | 0 refills | Status: AC

## 2016-11-21 MED ORDER — PREGABALIN 25 MG PO CAPS: 25.0000 mg | ORAL_CAPSULE | Freq: Every day | ORAL | 0 refills | Status: AC

## 2016-11-21 MED ORDER — TRAMADOL HCL 50 MG PO TABS: 50.0000 mg | ORAL_TABLET | Freq: Two times a day (BID) | ORAL | 0 refills | Status: AC | PRN

## 2016-11-21 MED ORDER — METOPROLOL TARTRATE 25 MG PO TABS: 12.5000 mg | ORAL_TABLET | Freq: Two times a day (BID) | ORAL | 0 refills | Status: AC

## 2016-11-21 MED ORDER — RENA-VITE PO TABS: 1.0000 | ORAL_TABLET | Freq: Every day | ORAL | 0 refills | Status: AC

## 2016-11-21 NOTE — Progress Notes (Signed)
Social Work  Discharge Note  The overall goal for the admission was met for:   Discharge location: [place] TO PROVIDE 24 HR CARE TO BOTH HE AND WIFE  Length of Stay: Yes-18 DAYS  Discharge activity level: Yes-SUPERVISION-MIN ASSIST LEVEL  Home/community participation: Yes  Services provided included: MD, RD, PT, OT, SLP, RN, CM, Pharmacy and SW  Financial Services: Medicare and Private Insurance: [employer]  Follow-up services arranged: Home Health: [place] CARE-PT,OT, RN, DME: [place] and Patient/Family has no preference for HH/DME agencies  Comments (or additional information):FAMILY WAS HERE DAILY AND LEARNED HIS CARE BETWEEN ALL OF THEIR FIVE CHILDREN THEY WILL HAVE 24 HR CARE. WIFE NEEDS CARE HERSELF DUE TO DEMENTIA. GAVE OP HD INFO-T,TH,SAT 5;50 CHAIR TIME IN [place]. SO TO TRANSPORT PT TO HD. PT READY TO GO HOME.  Patient/Family verbalized understanding of follow-up arrangements: Yes  Individual responsible for coordination of the follow-up plan: SELF & CHILDREN  Confirmed correct DME delivered: Elease Hashimoto 11/21/2016    Elease Hashimoto

## 2016-11-21 NOTE — Progress Notes (Signed)
[facility] Progress Note   Assessment/ Plan:   1 ESRD-Patient had one HD session yesterday and another one this morning. He will be discharge this afternoon from CIR. Patient midodrine dose has been increased to 20 mg tid from 10 mg with sustained low BP. Clinically patient is at his baseline with no sign of uremia. Patient will start outpatient dialysis on 11/13. TTS schedule as OP, has been CLIP'd to [facility].  --Continue Midodrine 20 mg tid  2 Hypertension/ volume:  This am 103/50 . Currently on midodrine midodrine 20 mg TID.   3 CAD- s/p CABG/Maze/Afib  On warfarin. INR 2.84 and at goal.Continue management per pharmacy. Goal 2-3. Last platelet count 170 on 11/5 --Follow up on am CBC and BMP  --Continue amiodarone.  4 Deconditioning and debility- in CIR. Patient is tolerating therapy and progression has been appropriate. Anticipate discharge tomorrow 11/9.  5 DM- Per primary  6 Anemia- Last Hgb 8.6 (11/2), on Aranesp 200 mcg q Wednesday and ferrlicet 010 mcg x 8 doses, started 10/26. --Follow up on am CBC  7 Bone/ mineral: Phos 6.5, add velphoro 500 mg TID with meals, calcitriol   8.  L hand numbness--> 3rd-5th fingers of R hand.  Hand well-perfused, Ulnar neuropathy vs mild steal. --Consider VVS consult, for now will continue to monitor.  9 Dispo: Anticipate discharge from [place] today 11/9.   Nephrology team is signing off at this point, please consult [place] if primary team has any other questions prior to discharge.  Subjective:    Patient had dialysis yesterday and tolerated it well. Did not see patient this morning since he was getting HD prior to discharge later today.   Objective:   BP (!) 98/48   Pulse 93   Temp 97.8 F (36.6 C) (Oral)   Resp 17   Ht 5\' 8"  (1.727 m)   Wt 201 lb 1 oz (91.2 kg) Comment: Standing Weight  SpO2 100%   BMI 30.57 kg/m   Physical Exam: General appearance: NAD Resp: Rhonchi heard bilaterally upper and lower lung  fields Chest wall: RIJ tunneled HD cath Cardio: regular rate and rhythm, S1, S2 normal  GI: mild distension, decreased bs, nontender Extremities: 1+ edema, AVF RUA with +T/B, R hand well-perfused.  Peeling skin bilateral hands.  Labs: BMET Recent Labs  Lab 11/14/16 1842 11/17/16 2000 11/19/16 0551 11/21/16 0738  NA 129* 132* 133* 134*  K 4.4 5.1 5.1 4.5  CL 95* 95* 95* 96*  CO2 24 23 28 28   GLUCOSE 201* 102* 90 88  BUN 40* 54* 37* 18  CREATININE 5.47* 6.95* 5.36* 3.70*  CALCIUM 8.0* 8.1* 8.0* 8.1*  PHOS 2.9 2.7  --  2.8   CBC Recent Labs  Lab 11/14/16 1842 11/17/16 2000 11/19/16 0551 11/21/16 0738  WBC 11.9* 13.6* 11.0* 11.7*  HGB 8.6* 8.6* 8.6* 8.6*  HCT 27.5* 27.3* 27.4* 27.7*  MCV 92.9 92.2 92.9 93.6  PLT 189 170 160 171    @IMGRELPRIORS @ Medications:    . amiodarone  200 mg Oral Daily  . aspirin  81 mg Per Tube Daily  . atorvastatin  40 mg Oral q1800  . darbepoetin (ARANESP) injection - DIALYSIS  200 mcg Intravenous Q Thu-HD  . diclofenac sodium  2 g Topical QID  . feeding supplement (GLUCERNA SHAKE)  237 mL Oral BID BM  . finasteride  5 mg Oral QHS  . Gerhardt's butt cream   Topical BID  . guaiFENesin  600 mg Oral BID  .  hydrocortisone cream   Topical BID  . insulin aspart  0-15 Units Subcutaneous TID WC  . insulin detemir  15 Units Subcutaneous Daily  . levothyroxine  25 mcg Per Tube QAC breakfast  . lidocaine  1 patch Transdermal Q24H  . midodrine  20 mg Oral TID WC  . multivitamin  1 tablet Oral QHS  . nystatin cream   Topical BID  . pantoprazole  40 mg Oral Daily  . predniSONE  5 mg Oral Q breakfast  . pregabalin  25 mg Oral Daily  . tamsulosin  0.4 mg Oral QHS  . Warfarin - Pharmacist Dosing Inpatient   Does not apply [place], MD [place], PGY-2  11/21/2016, 10:55 AM

## 2016-11-21 NOTE — Progress Notes (Signed)
ANTICOAGULATION CONSULT NOTE -Follow up Pharmacy Consult for Warfarin Indication: atrial fibrillation  No Known Allergies  Patient Measurements: Height: 5\' 8"  (172.7 cm) Weight: 198 lb 6.6 oz (90 kg) IBW/kg (Calculated) : 68.4  Vital Signs: Temp: 97.8 F (36.6 C) (11/09 1113) Temp Source: Oral (11/09 1113) BP: 99/48 (11/09 1113) Pulse Rate: 82 (11/09 1113)  Labs: Recent Labs    11/19/16 0551 11/20/16 0622 11/21/16 0457 11/21/16 0738  HGB 8.6*  --   --  8.6*  HCT 27.4*  --   --  27.7*  PLT 160  --   --  171  LABPROT 19.8* 24.6* 29.6*  --   INR 1.69 2.24 2.84  --   CREATININE 5.36*  --   --  3.70*    Estimated Creatinine Clearance: 16.2 mL/min (A) (by C-G formula based on SCr of 3.7 mg/dL (H)).   Assessment: 32 yoM transferred to CIR on 10/22 after a prolonged hospital stay that included CABG/MAZE with AKI and new ESRD. He has a h/o afib on apixaban PTA, which was switched to warfarin during the last admission.   INR 2.24 > 2.84 today, therapeutic. Pt appears very sensitive to coumadin dose change. CBC stable. Plan for discharge today.  Goal of Therapy:  INR 2-3 Monitor platelets by anticoagulation protocol: Yes   Plan:  Hold coumadin today Restart tomorrow with 2.5 mg daily as ordered. Recommend INR check earlier next week. Pt might need weekly INR check initially d/t INR fluctuation.  Maryanna Shape, PharmD, BCPS  Clinical Pharmacist  Pager: [phone]   11/21/2016 12:06 PM

## 2016-11-21 NOTE — Progress Notes (Signed)
[place] [facility] [place] PHYSICAL MEDICINE & REHABILITATION     PROGRESS NOTE  Subjective/Complaints:  Pt seen laying in bed.  He slept well overnight and is ready for discharge. Discussed with family earlier regarding discharge paperwork/instructions.  ROS: +Right wrist pain. Denies nausea, vomiting, diarrhea, shortness of breath or chest pain   Objective: Vital Signs: Blood pressure (!) 99/48, pulse 82, temperature 97.8 F (36.6 C), temperature source Oral, resp. rate 17, height 5\' 8"  (1.727 m), weight 90 kg (198 lb 6.6 oz), SpO2 100 %. Dg Swallowing Func-speech Pathology  Result Date: 11/20/2016 Objective Swallowing Evaluation: Type of Study: MBS-Modified Barium Swallow Study  Patient Details Name: GRISELDA TOSH MRN: [id number] Date of Birth: [birth year] Today's Date: 11/20/2016 Past Medical History: Past Medical History: Diagnosis Date . Anemia  . Asthma  . Atrial fibrillation ([place]) 12/08/2013 . CHF (congestive heart failure) ([place])  . Chronic joint pain   "from RA" (10/01/2016) . CKD (chronic kidney disease), stage III ([place])  . Constipation   takes stool softener daily . COPD (chronic obstructive pulmonary disease) ([place])  . Coronary artery disease  . Coronary artery disease involving native coronary artery of native heart with angina pectoris ([place]) 09/30/2016 . Dysrhythmia   a-fib . GERD (gastroesophageal reflux disease)   takes Zantac daily  . History of blood transfusion   "related to one of my ORs" . Hyperlipidemia   takes Pravastatin daily . Hypertension   takes Cardizem,Proscar,and Lisinopril daily . Hypothyroidism  . Impaired hearing   wears hearing aids . Joint swelling   "from RA" (10/01/2016) . Peripheral neuropathy  . Pneumonia 2017 . Rheumatoid arthritis ([place])   "all over; take orencia  q 4 wks" (10/01/2016) . S/P CABG x 2 10/09/2016  LIMA to LAD, SVG to OM, EVH via right thigh . S/P Maze operation for atrial fibrillation 10/09/2016  Complete bilateral atrial lesion set using bipolar radiofrequency and  cryothermy ablation with clipping of LA appendage . Type II diabetes mellitus ([place])  Past Surgical History: Past Surgical History: Procedure Laterality Date . ANKLE FUSION Left 2014 . BIOPSY THYROID  2014 . CARDIAC CATHETERIZATION  09/30/2016 . CARPAL TUNNEL RELEASE Bilateral 1990's . CATARACT EXTRACTION W/ INTRAOCULAR LENS  IMPLANT, BILATERAL Bilateral 1990's . COLONOSCOPY   . JOINT REPLACEMENT   . REPAIR TENDONS FOOT Left 1946; 1984  "farming accident; repaired tendons both times" . SHOULDER ARTHROSCOPY W/ ROTATOR CUFF REPAIR Right 1990's . TONSILLECTOMY  1930's . TOTAL KNEE ARTHROPLASTY Right 12/16/2010  Procedure: TOTAL KNEE ARTHROPLASTY; rt Surgeon: Rudean Haskell, MD;  Location: [facility];  Service: Orthopedics;  Laterality: Right; . TOTAL SHOULDER REPLACEMENT Left 03/2010 HPI: Patient is an 83 year old male with history of hypertension, COPD, GERD, pna, chronic diastolic congestive heart failure, stage IV chronic kidney disease, hyperlipidemia, recurrent paroxysmal atrial fibrillation, bronchiectasis, asthma, rheumatoid arthritis, type 2 diabetes mellitus, and anemia. Underwent CABG 9/27. Intubated 9/27-9/28, 10/3-10/8. CXR RIGHT pigtail catheter no longer seen. No pneumothorax. Slight improvement in pulmonary edema. Started on Dys 3, honey thick liquids diet per MBS.  Repeat study ordered today to determine readiness to advance prior to discharge home.    No Data Recorded Assessment / Plan / Recommendation CHL IP CLINICAL IMPRESSIONS 11/20/2016 Clinical Impression Pt presents with modest improvements in swallowing function in comparison to last week.  Pt continues to have penetration with both nectar and thin liquids which clears more completely from the laryngeal vestibule with thickened liquids.  However, penetration subjectively appears decreased in frequency and intensity in comparison to previous study.  Despite slight improvements in function, do not recommend further diet advancement at this time given  pt's tenuous medical state and debility.  Pt may have sips of water in between meals following oral care to continue working towards further diet progression.   SLP Visit Diagnosis Dysphagia, oropharyngeal phase (R13.12)     Impact on safety and function Mild aspiration risk;Moderate aspiration risk   CHL IP TREATMENT RECOMMENDATION 10/29/2016 Treatment Recommendations Therapy as outlined in treatment plan below   Prognosis 10/29/2016 Prognosis for Safe Diet Advancement (No Data) Barriers to Reach Goals -- Barriers/Prognosis Comment -- CHL IP DIET RECOMMENDATION 11/20/2016 SLP Diet Recommendations Regular solids;Nectar thick liquid;Free water protocol after oral care Liquid Administration via -- Medication Administration Whole meds with puree Compensations Slow rate;Small sips/bites;Hard cough after swallow;Multiple dry swallows after each bite/sip Postural Changes Seated upright at 90 degrees   CHL IP OTHER RECOMMENDATIONS 10/29/2016 Recommended Consults -- Oral Care Recommendations Oral care BID Other Recommendations --   CHL IP FOLLOW UP RECOMMENDATIONS 10/31/2016 Follow up Recommendations Inpatient Rehab   CHL IP FREQUENCY AND DURATION 10/29/2016 Speech Therapy Frequency (ACUTE ONLY) min 2x/week Treatment Duration 2 weeks      CHL IP ORAL PHASE 11/20/2016 Oral Phase Impaired Oral - Pudding Teaspoon -- Oral - Pudding Cup -- Oral - Honey Teaspoon -- Oral - Honey Cup -- Oral - Nectar Teaspoon -- Oral - Nectar Cup WFL Oral - Nectar Straw -- Oral - Thin Teaspoon -- Oral - Thin Cup Piecemeal swallowing Oral - Thin Straw Premature spillage Oral - Puree -- Oral - Mech Soft -- Oral - Regular -- Oral - Multi-Consistency -- Oral - Pill -- Oral Phase - Comment --  CHL IP PHARYNGEAL PHASE 11/20/2016 Pharyngeal Phase Impaired Pharyngeal- Pudding Teaspoon -- Pharyngeal -- Pharyngeal- Pudding Cup -- Pharyngeal -- Pharyngeal- Honey Teaspoon -- Pharyngeal -- Pharyngeal- Honey Cup -- Pharyngeal -- Pharyngeal- Nectar Teaspoon --  Pharyngeal -- Pharyngeal- Nectar Cup Reduced airway/laryngeal closure;Reduced anterior laryngeal mobility;Penetration/Aspiration during swallow;Reduced epiglottic inversion Pharyngeal Material enters airway, remains ABOVE vocal cords then ejected out Pharyngeal- Nectar Straw -- Pharyngeal -- Pharyngeal- Thin Teaspoon -- Pharyngeal -- Pharyngeal- Thin Cup Delayed swallow initiation-vallecula;Delayed swallow initiation-pyriform sinuses;Reduced airway/laryngeal closure;Reduced anterior laryngeal mobility;Penetration/Aspiration during swallow;Reduced epiglottic inversion Pharyngeal Material enters airway, remains ABOVE vocal cords and not ejected out;Material enters airway, CONTACTS cords and not ejected out Pharyngeal- Thin Straw Delayed swallow initiation-pyriform sinuses;Reduced epiglottic inversion;Reduced anterior laryngeal mobility;Reduced airway/laryngeal closure;Penetration/Aspiration during swallow Pharyngeal Material enters airway, passes BELOW cords without attempt by patient to eject out (silent aspiration) Pharyngeal- Puree -- Pharyngeal -- Pharyngeal- Mechanical Soft -- Pharyngeal -- Pharyngeal- Regular -- Pharyngeal -- Pharyngeal- Multi-consistency -- Pharyngeal -- Pharyngeal- Pill -- Pharyngeal -- Pharyngeal Comment --  CHL IP CERVICAL ESOPHAGEAL PHASE 11/20/2016 Cervical Esophageal Phase Impaired Pudding Teaspoon -- Pudding Cup -- Honey Teaspoon -- Honey Cup -- Nectar Teaspoon -- Nectar Cup -- Nectar Straw -- Thin Teaspoon -- Thin Cup Reduced cricopharyngeal relaxation;Esophageal backflow into cervical esophagus Thin Straw Reduced cricopharyngeal relaxation;Esophageal backflow into cervical esophagus Puree -- Mechanical Soft -- Regular -- Multi-consistency -- Pill -- Cervical Esophageal Comment -- No flowsheet data found. Page, Elmyra Ricks L 11/20/2016, 4:14 PM              Recent Labs    11/19/16 0551 11/21/16 0738  WBC 11.0* 11.7*  HGB 8.6* 8.6*  HCT 27.4* 27.7*  PLT 160 171   Recent Labs     11/19/16 0551 11/21/16 0738  NA 133* 134*  K 5.1 4.5  CL 95* 96*  GLUCOSE 90 88  BUN 37* 18  CREATININE 5.36* 3.70*  CALCIUM 8.0* 8.1*   CBG (last 3)  Recent Labs    11/21/16 0642 11/21/16 0807 11/21/16 1003  GLUCAP 57* 97 95    Wt Readings from Last 3 Encounters:  11/21/16 90 kg (198 lb 6.6 oz)  11/03/16 92.6 kg (204 lb 2.3 oz)  10/06/16 99 kg (218 lb 5 oz)    Physical Exam:  BP (!) 99/48 (BP Location: Left Arm)   Pulse 82   Temp 97.8 F (36.6 C) (Oral)   Resp 17   Ht 5\' 8"  (1.727 m)   Wt 90 kg (198 lb 6.6 oz)   SpO2 100%   BMI 30.17 kg/m  Constitutional: He appears well-developed. NAD. Frail  HENT: Normocephalic and atraumatic.  Eyes: EOM are normal. No discharge.  Cardiovascular: RRR. No JVD. Respiratory: +Ronchi.  Normal effort  GI: BS +, non-distended   Musculoskeletal: He exhibits no edema. No tenderness. Neurological:  Alert and oriented Motor: 4+/5 B/l UE  4-/5 b/l LE (upper extremities stronger than lower extremities, unchanged) Skin: Skin is warm and dry.  Psychiatric: Normal mood and behavior.  Assessment/Plan: 1. Functional deficits secondary to debility which require 3+ hours per day of interdisciplinary therapy in a comprehensive inpatient rehab setting. Physiatrist is providing close team supervision and 24 hour management of active medical problems listed below. Physiatrist and rehab team continue to assess barriers to discharge/monitor patient progress toward functional and medical goals.  Function:  Bathing Bathing position   Position: Wheelchair/chair at sink  Bathing parts Body parts bathed by patient: Left lower leg, Right lower leg, Right upper leg, Left upper leg, Right arm, Left arm, Chest, Front perineal area, Buttocks Body parts bathed by helper: Back  Bathing assist Assist Level: Supervision or verbal cues      Upper Body Dressing/Undressing Upper body dressing   What is the patient wearing?: Pull over shirt/dress      Pull over shirt/dress - Perfomed by patient: Thread/unthread left sleeve, Put head through opening, Thread/unthread right sleeve, Pull shirt over trunk Pull over shirt/dress - Perfomed by helper: Pull shirt over trunk        Upper body assist Assist Level: Supervision or verbal cues      Lower Body Dressing/Undressing Lower body dressing   What is the patient wearing?: Non-skid slipper socks, Ted Hose, Pants     Pants- Performed by patient: Thread/unthread right pants leg, Thread/unthread left pants leg, Pull pants up/down Pants- Performed by helper: Pull pants up/down Non-skid slipper socks- Performed by patient: Don/doff right sock, Don/doff left sock Non-skid slipper socks- Performed by helper: Don/doff right sock, Don/doff left sock               TED Hose - Performed by helper: Don/doff right TED hose, Don/doff left TED hose  Lower body assist Assist for lower body dressing: Touching or steadying assistance (Pt > 75%)      Toileting Toileting Toileting activity did not occur: No continent bowel/bladder event   Toileting steps completed by helper: Adjust clothing prior to toileting, Performs perineal hygiene, Adjust clothing after toileting Toileting Assistive Devices: Grab bar or rail  Toileting assist Assist level: Supervision or verbal cues   Transfers Chair/bed transfer   Chair/bed transfer method: Stand pivot Chair/bed transfer assist level: Supervision or verbal cues Chair/bed transfer assistive device: Medical sales representative Ambulation activity did not occur: Refused   Max distance: 55  ft Assist level: Supervision or verbal cues   Wheelchair Wheelchair activity did not occur: Safety/medical concerns(sternal precautions) Type: Manual   Assist Level: Dependent (Pt equals 0%)  Cognition Comprehension Comprehension assist level: Follows complex conversation/direction with no assist  Expression Expression assist level: Expresses complex ideas:  With no assist  Social Interaction Social Interaction assist level: Interacts appropriately with others - No medications needed.  Problem Solving Problem solving assist level: Solves basic problems with no assist  Memory Memory assist level: Recognizes or recalls 90% of the time/requires cueing < 10% of the time    Medical Problem List and Plan: 1.  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 Patient has sternal precautions  D/c today  Will see patient for transitional care management in 1-2 weeks 2.  DVT Prophylaxis/Anticoagulation: Chronic Coumadin therapy for history of PAF   INR therapeutic on 11/9 3. Pain Management: Neurontin 300 mg twice a day, Ultram as needed 4. Mood: Provide emotional support 5. Neuropsych: This patient is capable of making decisions on his own behalf. 6. Skin/Wound Care: Routine skin checks  Nystatin cream ordered for fungal rash. 7. Fluids/Electrolytes/Nutrition: Routine I&Os 8.AKI/CKD stage 3-4. Follow-up renal services. Presently receiving hemodialysis Monday Wednesday Friday schedule monitoring for recovery of renal function.S/P right IJ tunneled dialysis catheter/right brachiocephalic AV fistula 10/29/5100  Appreciate Neprho recs, cont HD per Nephro 9. Acute on chronic anemia. Continue iron supplement  Hb 8.6 on 11/9  Cont to monitor 10. Dysphagia. Dysphagia #3 nectar liquids. Monitor hydration.   Advance diet as tolerated 11. Chronic diastolic congestive heart failure. Monitor for any signs of fluid overload.  Filed Weights   11/20/16 2145 11/21/16 0708 11/21/16 1113  Weight: 103.9 kg (229 lb) 91.2 kg (201 lb 1 oz) 90 kg (198 lb 6.6 oz)   Unreliable  CXR reviewed, suggesting CHF  Not requiring supplemental Oxygen anymore, despite requiring 2 L PTA 12. PAF. Chronic Coumadin, Lopressor 12.5 mg twice a day, amiodarone 200 mg every 12 hours, Midodrine 10 mg 3 times a day  Appreciate cards recs, notes reviewed 13. Diabetes  mellitus and peripheral neuropathy. Hemoglobin A1c 7.2. Check blood sugars before meals and at bedtime.  Reduced levemir to 20 u daily on 10/28, decreased to 15 units on 11/8  Will maintain SSI given initiation of steroids  Hypoglycemia this AM, did not receive breakfast, however 14. BPH. Proscar 5 mg daily, Flomax 0.4 mg daily 15. Hyperlipidemia. Lipitor 16. Hypothyroidism. Synthroid 17.  Hypotension   See  above  Related to HD.  Fluid management per nephro. 18.  RA  Monthly infusion PTA.    Discussed with pharmacy home medication/dosage, likely on orencia, not due until later this month, plan to resume his outpatient  Voltaren GEL ordered with improvement  Lidoderm patch also ordered on 10/31  Prednisone 5mg  started on 11/1 19. Leukocytosis, persistent  WBCs 11.7 on 11/9, labs with HD  Repeat CXR reviewed, slight improvement  Continue to monitor  Afebrile  20. Ulnar neuropathy vs steal syndrome vs RA  Gabapentin d/ced, lyrica 25mg  daily with 25 post-HD started on 11/7  Per Vasc, cont to follow, likely not related to fistula  LOS (Days) 18 A FACE TO FACE EVALUATION WAS PERFORMED  Taeden Geller Lorie Phenix 11/21/2016 11:39 AM

## 2016-11-21 NOTE — Discharge Summary (Signed)
NAME:  [name], [name] NO.:  [id number]  MEDICAL RECORD NO.:  [id number]  LOCATION:  4M04C                        FACILITY:  [facility]  PHYSICIAN:  William Lesch, MD        DATE OF BIRTH:  Jun 22, 1930  DATE OF ADMISSION:  11/03/2016 DATE OF DISCHARGE:  11/21/2016                              DISCHARGE SUMMARY   DISCHARGE DIAGNOSES: 1. Debilitation secondary to coronary artery bypass grafting - Maze     procedure on October 09, 2016. 2. Chronic Coumadin therapy. 3. Pain management. 4. End-stage renal disease. 5. Acute on chronic anemia. 6. Dysphagia. 7. Chronic diastolic congestive heart failure. 8. Paroxysmal atrial fibrillation. 9. Diabetes mellitus, peripheral neuropathy. 10.Benign prostatic hypertrophy. 11.Hyperlipidemia. 12.Hypothyroidism. 13.Hypotension. 14.Rheumatoid arthritis.  This is an 83 year old right-handed male, multi-medical, chronic diastolic congestive heart failure, stage 4 chronic renal insufficiency, recurrent PAF, on long-term anticoagulation, rheumatoid arthritis, diabetes mellitus.  Lives with spouse, independent prior to admission. Presented on October 08, 2016, with progressive shortness of breath. Noninvasive echocardiogram with ejection fraction of 45%.  No signs of ischemia.  Elective cardiac catheterization noted CAD.  Underwent CABG with Maze procedure on October 09, 2016, per Dr. Roxy Wallace.  Hospital course, pain management.  Sternal precautions.  Nephrology consulted for acute kidney injury on chronic kidney disease.  The patient had been receiving hemodialysis, followed up per Renal Services.  Plan ongoing as outpatient.  The patient did undergo right IJ tunneled catheter on October 28, 2016, as well as right brachiocephalic AV fistula per Dr. Scot Wallace.  Chronic Coumadin ongoing.  Acute on chronic anemia, 10.2 and monitored.  Swallow study maintained on dysphagia #3 honey thick liquid diet.  Physical and occupational therapy  ongoing.  The patient was admitted for a comprehensive rehab program.  PAST MEDICAL HISTORY:  See discharge diagnoses.  SOCIAL HISTORY:  Lives with spouse, independent prior to admission.  FUNCTIONAL STATUS UPON ADMISSION TO REHAB SERVICES:  Minimal assist 200 feet with a walker, moderate assist sit to stand, mod to max assist activities of daily living.  PHYSICAL EXAMINATION:  VITAL SIGNS:  Blood pressure 87/50, pulse 71, temperature 97, and respirations 14. GENERAL:  This was an alert male, in no acute distress. HEENT:  EOMs intact. NECK:  Supple, nontender.  No JVD. CARDIAC:  Rate controlled. ABDOMEN:  Soft, nontender.  Good bowel sounds. LUNGS:  Clear to auscultation without wheeze.  REHABILITATION HOSPITAL COURSE:  The patient was admitted to inpatient rehab services with therapies initiated on a 3-hour daily basis, consisting of physical therapy, occupational therapy, speech therapy, and rehabilitation nursing.  The following issues were addressed during the patient's rehabilitation stay.  Pertaining to Mr. [name]' debilitation after CABG with Maze procedure, he would follow up with Dr. Roxy Wallace.  No chest pain or shortness of breath.  He remained on chronic Coumadin therapy for PAF, cardiac rate controlled.  Latest INR of 1.86- 1.69 with a goal INR of 2.0-3.0.  He would follow up with Dr. Adrian Wallace for ongoing Coumadin management, [phone].  Pain management with the use of Neurontin as well as Ultram.  Hemodialysis ongoing which had been arranged as outpatient, followed by Renal Services.  Acute  on chronic anemia, 8.6, with iron supplement.  No bleeding episodes.  He was tolerating a mechanical soft nectar thick liquid diet, followed by Speech Therapy.  He exhibited no other signs of fluid overload.  Blood sugars overall controlled.  Hemoglobin A1c of 7.2.  His Levemir insulin had been adjusted.  Lipitor for hyperlipidemia.  Synthroid for hypothyroidism.  He did have a history  of rheumatoid arthritis.  He was receiving monthly infusions as discussed.  He remained on low-dose prednisone therapy.  Follow up with Vascular Surgery for placement of brachiocephalic AV fistula, with no other recommendations.  The patient received weekly collaborative interdisciplinary team conferences to discuss estimated length of stay, family teaching, any barriers to discharge.  The patient ambulates 55 feet rolling walker, working with energy conservation techniques, monitoring of blood pressure, went up with therapies.  Transferred wheelchair to bed, stand pivot using a rolling walker minimal assistance.  Transferred sitting to supine with supervision.  Activities of daily living and homemaking.  Working with family education.  The patient completes peri-washing, donning his lower extremities.  He was able to sit at the sink side and complete upper body bathing and washing lower extremities and feet with supervision. Full family teaching was completed and plan discharge to home.  DISCHARGE MEDICATIONS: 1. Amiodarone 200 mg daily 2. Aspirin 81 mg daily. 3. Lipitor 40 mg p.o. daily. 4. Voltaren gel 4 times a day to affected area. 5. Proscar 5 mg p.o. at bedtime. 6. Mucinex 600 mg p.o. b.i.d. 7. Levemir insulin 20 units daily. 8. Synthroid 25 mcg p.o. daily. 9. Lidoderm patch change daily. 10.Midodrine 10 mg p.o. t.i.d. 11.Rena-Vite 1 tablet daily. 12.Protonix 40 mg p.o. daily. 13.Prednisone 5 mg p.o. daily. 14.Lyrica 25 mg p.o. daily. 15.Flomax 0.4 mg at bedtime. 16.Coumadin as advised with latest dose of 5 mg. 17.Ultram 50 mg every 12 hours as needed pain.  DIET:  The patient's diet was mechanical soft nectar liquids, 1200 mL fluid restriction.  FOLLOWUP:  Followups would be with Dr. Delice Wallace at the [facility] as advised; Dr. Marval Wallace, Renal Services, call for appointment; [name], Cardiothoracic Surgery, call for appointment; Dr. Adrian Wallace;  [name], Vascular Surgery; [name], Medical Management.  SPECIAL INSTRUCTIONS:  Home health nurse to check INR on November 25, 2016.  Results to Dr. Adrian Wallace, Cardiology Services, [phone], fax [phone].  Continue hemodialysis as directed.     Lauraine Rinne, P.A.   ______________________________ William Lesch, MD    DA/MEDQ  D:  11/20/2016  T:  11/21/2016  Job:  [id number]  cc:   Judeth Cornfield. [name], M.D. Donato Heinz, M.D. Valentina Gu. [name], M.D. Laverda Page, MD William Dress, MD William Lesch, MD

## 2016-11-21 NOTE — Progress Notes (Signed)
Hypoglycemic Event  CBG: 61  Treatment: 15 GM carbohydrate snack  Symptoms: Hungry  Follow-up CBG: Time:2328 CBG Result:135  Possible Reasons for Event: Inadequate meal intake  Comments/MD notified:Patient did not receive dinner tray during HD therapy; dinner tray provided to patient on return from HD therapy    Damontre Millea O Kirtan Sada

## 2016-11-21 NOTE — Progress Notes (Addendum)
Contacted HD RN Sal Serrano for status update on meal provided to pt for breakfast and for low CBG of 57; received report that pt meal on hold and pt provided with carbohydrate beverage instead in order to increase CBG level d/t to possibility of decreasing pt's BP and hindering HD therapy. CBG reading of 97 at 0807.  Annita Brod, RN

## 2016-11-21 NOTE — Progress Notes (Signed)
Patient and family discussed all the discharged instructions with Helyn Numbers, PA  Before they left the hospital..

## 2016-11-21 NOTE — Progress Notes (Signed)
Hypoglycemic Event  CBG: 57  Treatment: 15 GM carbohydrate snack  Symptoms: Hungry  Follow-up CBG: EXMD:4709 CBG Result:97  Possible Reasons for Event: Unknown  Comments/MD notified:Pt provided apple juice and report given to HD RN, Tamera, to provide pt breakfast since breakfast trays had not yet arrived, there was no room in the HD schedule to wait for pt to receive breakfast tray first, and pt discharge date is today, 11/21/2016    Keshana Klemz O Keyan Folson

## 2016-11-23 DIAGNOSIS — E1122 Type 2 diabetes mellitus with diabetic chronic kidney disease: Secondary | ICD-10-CM | POA: Diagnosis not present

## 2016-11-23 DIAGNOSIS — Z951 Presence of aortocoronary bypass graft: Secondary | ICD-10-CM | POA: Diagnosis not present

## 2016-11-23 DIAGNOSIS — I5032 Chronic diastolic (congestive) heart failure: Secondary | ICD-10-CM | POA: Diagnosis not present

## 2016-11-23 DIAGNOSIS — Z48812 Encounter for surgical aftercare following surgery on the circulatory system: Secondary | ICD-10-CM | POA: Diagnosis not present

## 2016-11-23 DIAGNOSIS — Z7952 Long term (current) use of systemic steroids: Secondary | ICD-10-CM | POA: Diagnosis not present

## 2016-11-23 DIAGNOSIS — Z7901 Long term (current) use of anticoagulants: Secondary | ICD-10-CM | POA: Diagnosis not present

## 2016-11-23 DIAGNOSIS — D631 Anemia in chronic kidney disease: Secondary | ICD-10-CM | POA: Diagnosis not present

## 2016-11-23 DIAGNOSIS — Z794 Long term (current) use of insulin: Secondary | ICD-10-CM | POA: Diagnosis not present

## 2016-11-23 DIAGNOSIS — J449 Chronic obstructive pulmonary disease, unspecified: Secondary | ICD-10-CM | POA: Diagnosis not present

## 2016-11-23 DIAGNOSIS — R1310 Dysphagia, unspecified: Secondary | ICD-10-CM | POA: Diagnosis not present

## 2016-11-23 DIAGNOSIS — I132 Hypertensive heart and chronic kidney disease with heart failure and with stage 5 chronic kidney disease, or end stage renal disease: Secondary | ICD-10-CM | POA: Diagnosis not present

## 2016-11-23 DIAGNOSIS — Z7982 Long term (current) use of aspirin: Secondary | ICD-10-CM | POA: Diagnosis not present

## 2016-11-23 DIAGNOSIS — I2510 Atherosclerotic heart disease of native coronary artery without angina pectoris: Secondary | ICD-10-CM | POA: Diagnosis not present

## 2016-11-23 DIAGNOSIS — K219 Gastro-esophageal reflux disease without esophagitis: Secondary | ICD-10-CM | POA: Diagnosis not present

## 2016-11-23 DIAGNOSIS — I480 Paroxysmal atrial fibrillation: Secondary | ICD-10-CM | POA: Diagnosis not present

## 2016-11-23 DIAGNOSIS — Z992 Dependence on renal dialysis: Secondary | ICD-10-CM | POA: Diagnosis not present

## 2016-11-23 DIAGNOSIS — E1142 Type 2 diabetes mellitus with diabetic polyneuropathy: Secondary | ICD-10-CM | POA: Diagnosis not present

## 2016-11-23 DIAGNOSIS — N186 End stage renal disease: Secondary | ICD-10-CM | POA: Diagnosis not present

## 2016-11-23 DIAGNOSIS — E785 Hyperlipidemia, unspecified: Secondary | ICD-10-CM | POA: Diagnosis not present

## 2016-11-23 DIAGNOSIS — M069 Rheumatoid arthritis, unspecified: Secondary | ICD-10-CM | POA: Diagnosis not present

## 2016-11-24 ENCOUNTER — Ambulatory Visit: Payer: Medicare Other | Admitting: Thoracic Surgery (Cardiothoracic Vascular Surgery)

## 2016-11-25 DIAGNOSIS — E8770 Fluid overload, unspecified: Secondary | ICD-10-CM | POA: Diagnosis not present

## 2016-11-25 DIAGNOSIS — N2581 Secondary hyperparathyroidism of renal origin: Secondary | ICD-10-CM | POA: Diagnosis not present

## 2016-11-25 DIAGNOSIS — I5032 Chronic diastolic (congestive) heart failure: Secondary | ICD-10-CM | POA: Diagnosis not present

## 2016-11-25 DIAGNOSIS — Z48812 Encounter for surgical aftercare following surgery on the circulatory system: Secondary | ICD-10-CM | POA: Diagnosis not present

## 2016-11-25 DIAGNOSIS — N186 End stage renal disease: Secondary | ICD-10-CM | POA: Diagnosis not present

## 2016-11-25 DIAGNOSIS — E1122 Type 2 diabetes mellitus with diabetic chronic kidney disease: Secondary | ICD-10-CM | POA: Diagnosis not present

## 2016-11-25 DIAGNOSIS — I2510 Atherosclerotic heart disease of native coronary artery without angina pectoris: Secondary | ICD-10-CM | POA: Diagnosis not present

## 2016-11-25 DIAGNOSIS — E0822 Diabetes mellitus due to underlying condition with diabetic chronic kidney disease: Secondary | ICD-10-CM | POA: Diagnosis not present

## 2016-11-25 DIAGNOSIS — I132 Hypertensive heart and chronic kidney disease with heart failure and with stage 5 chronic kidney disease, or end stage renal disease: Secondary | ICD-10-CM | POA: Diagnosis not present

## 2016-11-26 ENCOUNTER — Other Ambulatory Visit: Payer: Self-pay

## 2016-11-26 ENCOUNTER — Emergency Department (HOSPITAL_COMMUNITY): Payer: Medicare Other

## 2016-11-26 ENCOUNTER — Telehealth: Payer: Self-pay | Admitting: *Deleted

## 2016-11-26 ENCOUNTER — Inpatient Hospital Stay (HOSPITAL_COMMUNITY)
Admission: EM | Admit: 2016-11-26 | Discharge: 2016-12-13 | DRG: 871 | Disposition: E | Payer: Medicare Other | Attending: Internal Medicine | Admitting: Internal Medicine

## 2016-11-26 ENCOUNTER — Encounter (HOSPITAL_COMMUNITY): Payer: Self-pay | Admitting: Emergency Medicine

## 2016-11-26 DIAGNOSIS — Z96651 Presence of right artificial knee joint: Secondary | ICD-10-CM | POA: Diagnosis present

## 2016-11-26 DIAGNOSIS — I5032 Chronic diastolic (congestive) heart failure: Secondary | ICD-10-CM | POA: Diagnosis not present

## 2016-11-26 DIAGNOSIS — Z515 Encounter for palliative care: Secondary | ICD-10-CM

## 2016-11-26 DIAGNOSIS — I480 Paroxysmal atrial fibrillation: Secondary | ICD-10-CM | POA: Diagnosis present

## 2016-11-26 DIAGNOSIS — I82612 Acute embolism and thrombosis of superficial veins of left upper extremity: Secondary | ICD-10-CM | POA: Diagnosis present

## 2016-11-26 DIAGNOSIS — R6521 Severe sepsis with septic shock: Secondary | ICD-10-CM | POA: Diagnosis not present

## 2016-11-26 DIAGNOSIS — E860 Dehydration: Secondary | ICD-10-CM

## 2016-11-26 DIAGNOSIS — I5022 Chronic systolic (congestive) heart failure: Secondary | ICD-10-CM | POA: Diagnosis present

## 2016-11-26 DIAGNOSIS — N179 Acute kidney failure, unspecified: Secondary | ICD-10-CM | POA: Diagnosis present

## 2016-11-26 DIAGNOSIS — R197 Diarrhea, unspecified: Secondary | ICD-10-CM

## 2016-11-26 DIAGNOSIS — R1030 Lower abdominal pain, unspecified: Secondary | ICD-10-CM | POA: Diagnosis not present

## 2016-11-26 DIAGNOSIS — Z7982 Long term (current) use of aspirin: Secondary | ICD-10-CM | POA: Diagnosis not present

## 2016-11-26 DIAGNOSIS — I959 Hypotension, unspecified: Secondary | ICD-10-CM | POA: Diagnosis not present

## 2016-11-26 DIAGNOSIS — J90 Pleural effusion, not elsewhere classified: Secondary | ICD-10-CM

## 2016-11-26 DIAGNOSIS — I82C12 Acute embolism and thrombosis of left internal jugular vein: Secondary | ICD-10-CM | POA: Diagnosis present

## 2016-11-26 DIAGNOSIS — Z79890 Hormone replacement therapy (postmenopausal): Secondary | ICD-10-CM

## 2016-11-26 DIAGNOSIS — E1122 Type 2 diabetes mellitus with diabetic chronic kidney disease: Secondary | ICD-10-CM | POA: Diagnosis present

## 2016-11-26 DIAGNOSIS — J9601 Acute respiratory failure with hypoxia: Secondary | ICD-10-CM

## 2016-11-26 DIAGNOSIS — A419 Sepsis, unspecified organism: Secondary | ICD-10-CM | POA: Diagnosis not present

## 2016-11-26 DIAGNOSIS — N2581 Secondary hyperparathyroidism of renal origin: Secondary | ICD-10-CM | POA: Diagnosis present

## 2016-11-26 DIAGNOSIS — G9341 Metabolic encephalopathy: Secondary | ICD-10-CM | POA: Diagnosis present

## 2016-11-26 DIAGNOSIS — Z7901 Long term (current) use of anticoagulants: Secondary | ICD-10-CM

## 2016-11-26 DIAGNOSIS — E1165 Type 2 diabetes mellitus with hyperglycemia: Secondary | ICD-10-CM | POA: Diagnosis present

## 2016-11-26 DIAGNOSIS — D631 Anemia in chronic kidney disease: Secondary | ICD-10-CM | POA: Diagnosis present

## 2016-11-26 DIAGNOSIS — K219 Gastro-esophageal reflux disease without esophagitis: Secondary | ICD-10-CM | POA: Diagnosis present

## 2016-11-26 DIAGNOSIS — K559 Vascular disorder of intestine, unspecified: Secondary | ICD-10-CM | POA: Insufficient documentation

## 2016-11-26 DIAGNOSIS — Z48812 Encounter for surgical aftercare following surgery on the circulatory system: Secondary | ICD-10-CM | POA: Diagnosis not present

## 2016-11-26 DIAGNOSIS — I132 Hypertensive heart and chronic kidney disease with heart failure and with stage 5 chronic kidney disease, or end stage renal disease: Secondary | ICD-10-CM | POA: Diagnosis present

## 2016-11-26 DIAGNOSIS — M7989 Other specified soft tissue disorders: Secondary | ICD-10-CM | POA: Diagnosis not present

## 2016-11-26 DIAGNOSIS — R6520 Severe sepsis without septic shock: Secondary | ICD-10-CM | POA: Diagnosis present

## 2016-11-26 DIAGNOSIS — R140 Abdominal distension (gaseous): Secondary | ICD-10-CM | POA: Diagnosis not present

## 2016-11-26 DIAGNOSIS — N186 End stage renal disease: Secondary | ICD-10-CM

## 2016-11-26 DIAGNOSIS — R05 Cough: Secondary | ICD-10-CM | POA: Diagnosis not present

## 2016-11-26 DIAGNOSIS — Z87891 Personal history of nicotine dependence: Secondary | ICD-10-CM | POA: Diagnosis not present

## 2016-11-26 DIAGNOSIS — Z951 Presence of aortocoronary bypass graft: Secondary | ICD-10-CM

## 2016-11-26 DIAGNOSIS — R221 Localized swelling, mass and lump, neck: Secondary | ICD-10-CM

## 2016-11-26 DIAGNOSIS — I9589 Other hypotension: Secondary | ICD-10-CM | POA: Diagnosis present

## 2016-11-26 DIAGNOSIS — Z66 Do not resuscitate: Secondary | ICD-10-CM | POA: Diagnosis present

## 2016-11-26 DIAGNOSIS — R109 Unspecified abdominal pain: Secondary | ICD-10-CM | POA: Diagnosis not present

## 2016-11-26 DIAGNOSIS — R918 Other nonspecific abnormal finding of lung field: Secondary | ICD-10-CM | POA: Diagnosis not present

## 2016-11-26 DIAGNOSIS — K6389 Other specified diseases of intestine: Secondary | ICD-10-CM | POA: Diagnosis not present

## 2016-11-26 DIAGNOSIS — E875 Hyperkalemia: Secondary | ICD-10-CM | POA: Diagnosis not present

## 2016-11-26 DIAGNOSIS — J690 Pneumonitis due to inhalation of food and vomit: Secondary | ICD-10-CM | POA: Diagnosis present

## 2016-11-26 DIAGNOSIS — Z794 Long term (current) use of insulin: Secondary | ICD-10-CM

## 2016-11-26 DIAGNOSIS — R112 Nausea with vomiting, unspecified: Secondary | ICD-10-CM | POA: Diagnosis not present

## 2016-11-26 DIAGNOSIS — E890 Postprocedural hypothyroidism: Secondary | ICD-10-CM | POA: Diagnosis present

## 2016-11-26 DIAGNOSIS — L8990 Pressure ulcer of unspecified site, unspecified stage: Secondary | ICD-10-CM

## 2016-11-26 DIAGNOSIS — K5000 Crohn's disease of small intestine without complications: Secondary | ICD-10-CM | POA: Diagnosis present

## 2016-11-26 DIAGNOSIS — D72829 Elevated white blood cell count, unspecified: Secondary | ICD-10-CM

## 2016-11-26 DIAGNOSIS — E785 Hyperlipidemia, unspecified: Secondary | ICD-10-CM | POA: Diagnosis present

## 2016-11-26 DIAGNOSIS — J9690 Respiratory failure, unspecified, unspecified whether with hypoxia or hypercapnia: Secondary | ICD-10-CM | POA: Diagnosis not present

## 2016-11-26 DIAGNOSIS — Z86718 Personal history of other venous thrombosis and embolism: Secondary | ICD-10-CM | POA: Diagnosis not present

## 2016-11-26 DIAGNOSIS — E869 Volume depletion, unspecified: Secondary | ICD-10-CM | POA: Diagnosis present

## 2016-11-26 DIAGNOSIS — E1142 Type 2 diabetes mellitus with diabetic polyneuropathy: Secondary | ICD-10-CM | POA: Diagnosis present

## 2016-11-26 DIAGNOSIS — Z96612 Presence of left artificial shoulder joint: Secondary | ICD-10-CM | POA: Diagnosis present

## 2016-11-26 DIAGNOSIS — Z7189 Other specified counseling: Secondary | ICD-10-CM | POA: Diagnosis not present

## 2016-11-26 DIAGNOSIS — Z992 Dependence on renal dialysis: Secondary | ICD-10-CM | POA: Diagnosis not present

## 2016-11-26 DIAGNOSIS — R0602 Shortness of breath: Secondary | ICD-10-CM | POA: Diagnosis not present

## 2016-11-26 DIAGNOSIS — J449 Chronic obstructive pulmonary disease, unspecified: Secondary | ICD-10-CM | POA: Diagnosis present

## 2016-11-26 DIAGNOSIS — K55019 Acute (reversible) ischemia of small intestine, extent unspecified: Secondary | ICD-10-CM | POA: Diagnosis not present

## 2016-11-26 DIAGNOSIS — I2510 Atherosclerotic heart disease of native coronary artery without angina pectoris: Secondary | ICD-10-CM | POA: Diagnosis not present

## 2016-11-26 DIAGNOSIS — M069 Rheumatoid arthritis, unspecified: Secondary | ICD-10-CM | POA: Diagnosis present

## 2016-11-26 DIAGNOSIS — E8770 Fluid overload, unspecified: Secondary | ICD-10-CM | POA: Diagnosis not present

## 2016-11-26 DIAGNOSIS — R791 Abnormal coagulation profile: Secondary | ICD-10-CM | POA: Diagnosis present

## 2016-11-26 LAB — CBC WITH DIFFERENTIAL/PLATELET
Basophils Absolute: 0 10*3/uL (ref 0.0–0.1)
Basophils Relative: 0 %
EOS ABS: 0 10*3/uL (ref 0.0–0.7)
EOS PCT: 0 %
HEMATOCRIT: 34.1 % — AB (ref 39.0–52.0)
Hemoglobin: 10.6 g/dL — ABNORMAL LOW (ref 13.0–17.0)
LYMPHS ABS: 4.7 10*3/uL — AB (ref 0.7–4.0)
Lymphocytes Relative: 11 %
MCH: 30.4 pg (ref 26.0–34.0)
MCHC: 31.1 g/dL (ref 30.0–36.0)
MCV: 97.7 fL (ref 78.0–100.0)
MONO ABS: 1.7 10*3/uL — AB (ref 0.1–1.0)
Monocytes Relative: 4 %
NEUTROS PCT: 85 %
Neutro Abs: 36.3 10*3/uL — ABNORMAL HIGH (ref 1.7–7.7)
PLATELETS: 275 10*3/uL (ref 150–400)
RBC: 3.49 MIL/uL — AB (ref 4.22–5.81)
RDW: 23.5 % — AB (ref 11.5–15.5)
WBC: 42.7 10*3/uL — AB (ref 4.0–10.5)

## 2016-11-26 LAB — COMPREHENSIVE METABOLIC PANEL
ALK PHOS: 97 U/L (ref 38–126)
ALT: 24 U/L (ref 17–63)
AST: 31 U/L (ref 15–41)
Albumin: 2.3 g/dL — ABNORMAL LOW (ref 3.5–5.0)
Anion gap: 16 — ABNORMAL HIGH (ref 5–15)
BILIRUBIN TOTAL: 1.6 mg/dL — AB (ref 0.3–1.2)
BUN: 38 mg/dL — AB (ref 6–20)
CALCIUM: 8.5 mg/dL — AB (ref 8.9–10.3)
CO2: 26 mmol/L (ref 22–32)
CREATININE: 4.81 mg/dL — AB (ref 0.61–1.24)
Chloride: 94 mmol/L — ABNORMAL LOW (ref 101–111)
GFR, EST AFRICAN AMERICAN: 12 mL/min — AB (ref >60)
GFR, EST NON AFRICAN AMERICAN: 10 mL/min — AB (ref >60)
Glucose, Bld: 153 mg/dL — ABNORMAL HIGH (ref 65–99)
Potassium: 5.2 mmol/L — ABNORMAL HIGH (ref 3.5–5.1)
Sodium: 136 mmol/L (ref 135–145)
TOTAL PROTEIN: 6 g/dL — AB (ref 6.5–8.1)

## 2016-11-26 LAB — CLOSTRIDIUM DIFFICILE BY PCR: CDIFFPCR: NEGATIVE

## 2016-11-26 LAB — C DIFFICILE QUICK SCREEN W PCR REFLEX
C DIFFICILE (CDIFF) TOXIN: NEGATIVE
C Diff antigen: POSITIVE — AB

## 2016-11-26 LAB — I-STAT CG4 LACTIC ACID, ED
LACTIC ACID, VENOUS: 2.27 mmol/L — AB (ref 0.5–1.9)
Lactic Acid, Venous: 4.8 mmol/L (ref 0.5–1.9)

## 2016-11-26 LAB — PROTIME-INR
INR: 2.43
Prothrombin Time: 26.2 seconds — ABNORMAL HIGH (ref 11.4–15.2)

## 2016-11-26 MED ORDER — VANCOMYCIN HCL 10 G IV SOLR
2000.0000 mg | Freq: Once | INTRAVENOUS | Status: AC
  Administered 2016-11-26: 2000 mg via INTRAVENOUS
  Filled 2016-11-26: qty 2000

## 2016-11-26 MED ORDER — SODIUM CHLORIDE 0.9 % IV SOLN: 250.0000 mL | INTRAVENOUS | Status: DC | PRN

## 2016-11-26 MED ORDER — SODIUM CHLORIDE 0.9 % IV BOLUS (SEPSIS)
1000.0000 mL | Freq: Once | INTRAVENOUS | Status: AC
  Administered 2016-11-26: 1000 mL via INTRAVENOUS

## 2016-11-26 MED ORDER — HYDROCORTISONE NA SUCCINATE PF 100 MG IJ SOLR
50.0000 mg | Freq: Four times a day (QID) | INTRAMUSCULAR | Status: DC
  Administered 2016-11-27 – 2016-12-02 (×22): 50 mg via INTRAVENOUS
  Filled 2016-11-26 (×3): qty 2
  Filled 2016-11-26 (×4): qty 1
  Filled 2016-11-26 (×7): qty 2
  Filled 2016-11-26: qty 1
  Filled 2016-11-26 (×2): qty 2
  Filled 2016-11-26: qty 1
  Filled 2016-11-26 (×5): qty 2
  Filled 2016-11-26 (×2): qty 1
  Filled 2016-11-26: qty 2

## 2016-11-26 MED ORDER — VANCOMYCIN HCL IN DEXTROSE 1-5 GM/200ML-% IV SOLN: 1000.0000 mg | Freq: Once | INTRAVENOUS | Status: DC

## 2016-11-26 MED ORDER — HEPARIN SODIUM (PORCINE) 5000 UNIT/ML IJ SOLN: 5000.0000 [IU] | Freq: Three times a day (TID) | INTRAMUSCULAR | Status: DC

## 2016-11-26 MED ORDER — DEXTROSE 5 % IV SOLN
1.0000 g | INTRAVENOUS | Status: AC
  Administered 2016-11-27 – 2016-12-02 (×5): 1 g via INTRAVENOUS
  Filled 2016-11-26 (×6): qty 1

## 2016-11-26 MED ORDER — ARFORMOTEROL TARTRATE 15 MCG/2ML IN NEBU
15.0000 ug | INHALATION_SOLUTION | Freq: Two times a day (BID) | RESPIRATORY_TRACT | Status: DC
  Administered 2016-11-27 – 2016-12-05 (×16): 15 ug via RESPIRATORY_TRACT
  Filled 2016-11-26 (×20): qty 2

## 2016-11-26 MED ORDER — DEXTROSE 5 % IV SOLN
2.0000 g | Freq: Once | INTRAVENOUS | Status: AC
  Administered 2016-11-26: 2 g via INTRAVENOUS
  Filled 2016-11-26: qty 2

## 2016-11-26 MED ORDER — BUDESONIDE 0.5 MG/2ML IN SUSP
0.5000 mg | Freq: Two times a day (BID) | RESPIRATORY_TRACT | Status: DC
  Administered 2016-11-27 – 2016-12-05 (×16): 0.5 mg via RESPIRATORY_TRACT
  Filled 2016-11-26 (×20): qty 2

## 2016-11-26 MED ORDER — SODIUM CHLORIDE 0.9 % IV BOLUS (SEPSIS)
1000.0000 mL | Freq: Once | INTRAVENOUS | Status: DC
  Administered 2016-11-26: 1000 mL via INTRAVENOUS

## 2016-11-26 MED ORDER — VANCOMYCIN 50 MG/ML ORAL SOLUTION
125.0000 mg | Freq: Four times a day (QID) | ORAL | Status: DC
  Filled 2016-11-26 (×4): qty 2.5

## 2016-11-26 NOTE — Progress Notes (Signed)
Pharmacy Antibiotic Note  [name] is a 83 y.o. male admitted on 11/17/2016 with sepsis.  Pharmacy has been consulted for vancomycin and cefepime dosing. Also started on vancomycin PO for possible Cdiff. Pt is afebrile but WBC is elevated at 42.7. Pt recently started on hemodialysis but unclear if pt has a regular schedule at this time.   Plan: Vancomycin 2gm IV x 1 then f/u renal plans for further doses Cefepime 2gm IV x 1 then 1gm IV Q24H F/u renal plans, C&S, clinical status and trough at SS     Temp (24hrs), Avg:99.1 F (37.3 C), Min:99.1 F (37.3 C), Max:99.1 F (37.3 C)  Recent Labs  Lab 11/21/16 0738  WBC 11.7*  CREATININE 3.70*    Estimated Creatinine Clearance: 16.2 mL/min (A) (by C-G formula based on SCr of 3.7 mg/dL (H)).    No Known Allergies  Antimicrobials this admission: Vanc 11/14>> Cefepime 11/14>>  Dose adjustments this admission: N/A  Microbiology results: Pending  Thank you for allowing pharmacy to be a part of this patient's care.  Susette Seminara, Rande Lawman 11/27/2016 3:26 PM

## 2016-11-26 NOTE — ED Triage Notes (Signed)
Pt to ed via car from home, with family c/o pt has increasing weakness, diarrhea and vomiting started last night, has been incontinent of stool--  Pt is new dialysis pt since [facility] in [place]. Had surgery sept 27, 2018- spent 8 weeks in hospital-- last dialysis here on Friday, had first outpatient dialysis yesterday. Started having liquid stools again last night. Had a flexiseal during hospital stay per family for same.  Pt is pale/cyanotic, weak, unable to stand on own., short of breath.

## 2016-11-26 NOTE — Consult Note (Signed)
Reason for Consult:abd pain Referring Physician: Dr Anell Barr is an 83 y.o. male.  HPI: 86 yom who underwent CAB 9/27 that led to a couple months in hospital and rehab who just went home last Friday. His postop course was long and complicated by renal failure and failure to thrive.  He has history of hypotension and is on midodrine.  He underwent hd yesterday but was unable to complete due to hypotension.  He then presents today with diarrhea, shortness of breath, fever and abdominal discomfort.  He hasn o prior ab surgery. He was found to have elevated lactate, wbc 42, underwent ct scan that shows some pneumatosis.  cxr with vascular congestion and bibasilar opacities.  Has received 2L NS, abx.  Ccm has seen him and is admitting. I was asked to see him for abd pain and ct findings.    Past Medical History:  Diagnosis Date  . Anemia   . Asthma   . Atrial fibrillation ([place]) 12/08/2013  . CHF (congestive heart failure) ([place])   . Chronic joint pain    "from RA" (10/01/2016)  . CKD (chronic kidney disease), stage III ([place])   . Constipation    takes stool softener daily  . COPD (chronic obstructive pulmonary disease) ([place])   . Coronary artery disease   . Coronary artery disease involving native coronary artery of native heart with angina pectoris ([place]) 09/30/2016  . Dysrhythmia    a-fib  . GERD (gastroesophageal reflux disease)    takes Zantac daily   . History of blood transfusion    "related to one of my ORs"  . Hyperlipidemia    takes Pravastatin daily  . Hypertension    takes Cardizem,Proscar,and Lisinopril daily  . Hypothyroidism   . Impaired hearing    wears hearing aids  . Joint swelling    "from RA" (10/01/2016)  . Peripheral neuropathy   . Pneumonia 2017  . Rheumatoid arthritis ([place])    "all over; take orencia  q 4 wks" (10/01/2016)  . S/P CABG x 2 10/09/2016   LIMA to LAD, SVG to OM, EVH via right thigh  . S/P Maze operation for atrial fibrillation 10/09/2016    Complete bilateral atrial lesion set using bipolar radiofrequency and cryothermy ablation with clipping of LA appendage  . Type II diabetes mellitus ([place])     Past Surgical History:  Procedure Laterality Date  . ANKLE FUSION Left 2014  . BIOPSY THYROID  2014  . CARDIAC CATHETERIZATION  09/30/2016  . CARPAL TUNNEL RELEASE Bilateral 1990's  . CATARACT EXTRACTION W/ INTRAOCULAR LENS  IMPLANT, BILATERAL Bilateral 1990's  . COLONOSCOPY    . JOINT REPLACEMENT    . REPAIR TENDONS FOOT Left 1946; 1984   "farming accident; repaired tendons both times"  . SHOULDER ARTHROSCOPY W/ ROTATOR CUFF REPAIR Right 1990's  . TONSILLECTOMY  1930's  . TOTAL KNEE ARTHROPLASTY Right 12/16/2010   Procedure: TOTAL KNEE ARTHROPLASTY; rt Surgeon: Rudean Haskell, MD;  Location: [facility];  Service: Orthopedics;  Laterality: Right;  . TOTAL SHOULDER REPLACEMENT Left 03/2010    Family History  Problem Relation Age of Onset  . Cancer Mother        breast  . Aneurysm Father     Social History:  reports that he quit smoking about 51 years ago. His smoking use included cigarettes. He has a 15.00 pack-year smoking history. he has never used smokeless tobacco. He reports that he does not drink alcohol or use drugs.  Allergies: No Known Allergies  Medications: I have reviewed the patient's current medications.  Results for orders placed or performed during the hospital encounter of 11/25/2016 (from the past 48 hour(s))  Comprehensive metabolic panel     Status: Abnormal   Collection Time: 12/03/2016  2:55 PM  Result Value Ref Range   Sodium 136 135 - 145 mmol/L   Potassium 5.2 (H) 3.5 - 5.1 mmol/L   Chloride 94 (L) 101 - 111 mmol/L   CO2 26 22 - 32 mmol/L   Glucose, Bld 153 (H) 65 - 99 mg/dL   BUN 38 (H) 6 - 20 mg/dL   Creatinine, Ser 4.81 (H) 0.61 - 1.24 mg/dL   Calcium 8.5 (L) 8.9 - 10.3 mg/dL   Total Protein 6.0 (L) 6.5 - 8.1 g/dL   Albumin 2.3 (L) 3.5 - 5.0 g/dL   AST 31 15 - 41 U/L   ALT 24 17 - 63 U/L    Alkaline Phosphatase 97 38 - 126 U/L   Total Bilirubin 1.6 (H) 0.3 - 1.2 mg/dL   GFR calc non Af Amer 10 (L) >60 mL/min   GFR calc Af Amer 12 (L) >60 mL/min    Comment: (NOTE) The eGFR has been calculated using the CKD EPI equation. This calculation has not been validated in all clinical situations. eGFR's persistently <60 mL/min signify possible Chronic Kidney Disease.    Anion gap 16 (H) 5 - 15  CBC WITH DIFFERENTIAL     Status: Abnormal   Collection Time: 12/11/2016  2:55 PM  Result Value Ref Range   WBC 42.7 (H) 4.0 - 10.5 K/uL   RBC 3.49 (L) 4.22 - 5.81 MIL/uL   Hemoglobin 10.6 (L) 13.0 - 17.0 g/dL   HCT 34.1 (L) 39.0 - 52.0 %   MCV 97.7 78.0 - 100.0 fL   MCH 30.4 26.0 - 34.0 pg   MCHC 31.1 30.0 - 36.0 g/dL   RDW 23.5 (H) 11.5 - 15.5 %   Platelets 275 150 - 400 K/uL   Neutrophils Relative % 85 %   Lymphocytes Relative 11 %   Monocytes Relative 4 %   Eosinophils Relative 0 %   Basophils Relative 0 %   Neutro Abs 36.3 (H) 1.7 - 7.7 K/uL   Lymphs Abs 4.7 (H) 0.7 - 4.0 K/uL   Monocytes Absolute 1.7 (H) 0.1 - 1.0 K/uL   Eosinophils Absolute 0.0 0.0 - 0.7 K/uL   Basophils Absolute 0.0 0.0 - 0.1 K/uL   RBC Morphology POLYCHROMASIA PRESENT    WBC Morphology WHITE COUNT CONFIRMED ON SMEAR     Comment: FEW NEUTROPHIL BANDS NOTED  I-Stat CG4 Lactic Acid, ED  (not at  [facility])     Status: Abnormal   Collection Time: 11/15/2016  3:40 PM  Result Value Ref Range   Lactic Acid, Venous 4.80 (HH) 0.5 - 1.9 mmol/L   Comment NOTIFIED PHYSICIAN   C difficile quick scan w PCR reflex     Status: Abnormal   Collection Time: 11/16/2016  4:06 PM  Result Value Ref Range   C Diff antigen POSITIVE (A) NEGATIVE   C Diff toxin NEGATIVE NEGATIVE   C Diff interpretation Results are indeterminate. See PCR results.   I-Stat CG4 Lactic Acid, ED  (not at  [facility])     Status: Abnormal   Collection Time: 11/20/2016  5:55 PM  Result Value Ref Range   Lactic Acid, Venous 2.27 (HH) 0.5 - 1.9 mmol/L   Comment  NOTIFIED PHYSICIAN  Ct Abdomen Pelvis Wo Contrast  Result Date: 11/30/2016 CLINICAL DATA:  Generalized weakness with nausea EXAM: CT ABDOMEN AND PELVIS WITHOUT CONTRAST TECHNIQUE: Multidetector CT imaging of the abdomen and pelvis was performed following the standard protocol without IV contrast. COMPARISON:  10/16/2016 FINDINGS: Lower chest: Moderate left pleural effusion is noted. Mild left lower lobe atelectatic changes are seen. No other focal infiltrate is noted. Hepatobiliary: No focal liver abnormality is seen. No gallstones, gallbladder wall thickening, or biliary dilatation. Pancreas: Unremarkable. No pancreatic ductal dilatation or surrounding inflammatory changes. Spleen: Normal in size without focal abnormality. Adrenals/Urinary Tract: The adrenal glands are within normal limits. The kidneys demonstrates some cystic change stable from the previous exam. No renal calculi or obstructive changes are noted. The bladder is decompressed. Stomach/Bowel: Colon is well distended with both fluid and fecal material. No obstructive changes are seen. The appendix is not well visualized although no inflammatory changes to suggest appendicitis are seen. Mildly prominent loops of small bowel are noted in the mid abdomen containing fluid which may represent some mild enteritis. No definitive transition zone is identified. The distal aspect of the ileum however there are a few loops which have changes suggestive of pneumatosis. No portal venous gas is noted. Very mild inflammatory changes are noted surrounding the distal ileum. Vascular/Lymphatic: Aortic atherosclerosis. No enlarged abdominal or pelvic lymph nodes. Reproductive: Prostate is unremarkable. Other: No abdominal wall hernia or abnormality. No abdominopelvic ascites. Musculoskeletal: Degenerative changes of lumbar spine are noted. IMPRESSION: Mild small bowel distention without definitive transition zone. A few of the loops in the distal ileum  demonstrate findings suggestive of pneumatosis. This could represent some very early distal small bowel ischemia. No portal venous air is noted. Correlation to the physical exam is recommended. Moderate left pleural effusion with associated atelectatic changes. No other focal abnormality is seen. Critical Value/emergent results were called by telephone at the time of interpretation on 12/06/2016 at 5:05 pm to Dr. Merrily Pew , who verbally acknowledged these results. Electronically Signed   By: Inez Catalina M.D.   On: 12/11/2016 17:06   Dg Chest 2 View  Result Date: 11/16/2016 CLINICAL DATA:  Increasing weakness, diarrhea and vomiting since last night. Began hemodialysis approximately 6 weeks ago. EXAM: CHEST  2 VIEW COMPARISON:  Radiographs 11/13/2016 and 11/07/2016. FINDINGS: Right IJ dialysis catheters appear unchanged at the level of the superior cavoatrial junction. There is stable cardiomegaly status post CABG and left atrial appendage clipping. There are lower lung volumes with increased patchy opacities at both lung bases, likely atelectasis. Vascular congestion is present without overt pulmonary edema, confluent airspace opacity or significant pleural effusion. IMPRESSION: Vascular congestion with increased patchy bibasilar opacities, likely atelectasis. No definite edema. Electronically Signed   By: Richardean Sale M.D.   On: 11/30/2016 16:52    Review of Systems  Constitutional: Positive for fever.  Respiratory: Positive for shortness of breath.   Gastrointestinal: Positive for abdominal pain and diarrhea.  All other systems reviewed and are negative.  Blood pressure (!) 109/59, pulse 81, temperature 99.1 F (37.3 C), temperature source Oral, resp. rate 18, SpO2 95 %. Physical Exam  Vitals reviewed. Constitutional: He is oriented to person, place, and time. He appears distressed.  HENT:  Head: Normocephalic and atraumatic.  Right Ear: External ear normal.  Left Ear: External ear  normal.  Eyes: No scleral icterus.  Neck: Neck supple.  Cardiovascular: Normal rate and regular rhythm.  Respiratory:  Decreased bilateral bases  GI: Soft. There is tenderness (very  mild tenderness centrally, no peritoneal signs).  Neurological: He is alert and oriented to person, place, and time.  Skin: He is diaphoretic.    Assessment/Plan Small bowel pneumotosis  He clearly has some bowel ischemia likely related to hypotension made worse with HD.  He does not have any indication for surgery at this time.  I think if it came to surgery would need to have long talk with he and family about wishes.  I have told them tonight about what that would involve and that a very strong consideration for them would be to not do surgery given his overall status and prognosis.  He is getting checked for c diff which certainly could be exacerbating chronic hypotension and source of wbc.  Agree with abx and fluid hydration.  Npo. Will follow with you.  Tonantzin Mimnaugh 11/30/2016, 6:39 PM

## 2016-11-26 NOTE — ED Notes (Signed)
ED Provider at bedside. 

## 2016-11-26 NOTE — Progress Notes (Signed)
ANTICOAGULATION CONSULT NOTE - Initial Consult  Pharmacy Consult for heparin Indication: atrial fibrillation  No Known Allergies  Patient Measurements:   Heparin Dosing Weight: 86.8kg  Vital Signs: Temp: 99.1 F (37.3 C) (11/14 1444) Temp Source: Oral (11/14 1444) BP: 110/60 (11/14 1830) Pulse Rate: 84 (11/14 1830)  Labs: Recent Labs    11/27/2016 1455  HGB 10.6*  HCT 34.1*  PLT 275  CREATININE 4.81*    Estimated Creatinine Clearance: 12.5 mL/min (A) (by C-G formula based on SCr of 4.81 mg/dL (H)).   Medical History: Past Medical History:  Diagnosis Date  . Anemia   . Asthma   . Atrial fibrillation ([place]) 12/08/2013  . CHF (congestive heart failure) ([place])   . Chronic joint pain    "from RA" (10/01/2016)  . CKD (chronic kidney disease), stage III ([place])   . Constipation    takes stool softener daily  . COPD (chronic obstructive pulmonary disease) ([place])   . Coronary artery disease   . Coronary artery disease involving native coronary artery of native heart with angina pectoris ([place]) 09/30/2016  . Dysrhythmia    a-fib  . GERD (gastroesophageal reflux disease)    takes Zantac daily   . History of blood transfusion    "related to one of my ORs"  . Hyperlipidemia    takes Pravastatin daily  . Hypertension    takes Cardizem,Proscar,and Lisinopril daily  . Hypothyroidism   . Impaired hearing    wears hearing aids  . Joint swelling    "from RA" (10/01/2016)  . Peripheral neuropathy   . Pneumonia 2017  . Rheumatoid arthritis ([place])    "all over; take orencia  q 4 wks" (10/01/2016)  . S/P CABG x 2 10/09/2016   LIMA to LAD, SVG to OM, EVH via right thigh  . S/P Maze operation for atrial fibrillation 10/09/2016   Complete bilateral atrial lesion set using bipolar radiofrequency and cryothermy ablation with clipping of LA appendage  . Type II diabetes mellitus ([facility])    Assessment: 52 yom presented to the ED with increasing weakness and diarrhea. He is on chronic  coumadin for history of afib. Transitioning to IV heparin. Hgb 10.6 and platelets are WNL. No bleeding noted. INR is therapeutic at 2.43.   Goal of Therapy:  Heparin level 0.3-0.7 units/ml Monitor platelets by anticoagulation protocol: Yes   Plan:  Start IV heparin when INR<2 Daily INR  Krupa Stege, Rande Lawman 12/10/2016,7:11 PM

## 2016-11-26 NOTE — ED Notes (Signed)
Pt dialysis pt with restricted access, first IV didn't pull back.  ABX started after 1 blood culture obtained. Dr. Dayna Barker made aware.

## 2016-11-26 NOTE — ED Notes (Signed)
Attempted report x1. 

## 2016-11-26 NOTE — ED Notes (Signed)
Informed Dr. Lacinda Axon of lactic acid

## 2016-11-26 NOTE — Telephone Encounter (Signed)
Olivia Mackie, R.N. with [facility] called and left a message that she was sending [name] to the ED due to her findings during her visit today. She said he had increased weakness, emesis, reports of fever and incontinence of diarrhea. The lungs were not clear with congestion. V.S. [phone].Marland KitchenMarland KitchenMarland Kitchen86-94% O2.

## 2016-11-26 NOTE — ED Provider Notes (Signed)
Emergency Department Provider Note   I have reviewed the triage vital signs and the nursing notes.   HISTORY  Chief Complaint Hypotension and Diarrhea   HPI [name] is a 83 y.o. male with a long comp gated medical history but most recently it sounds like the patient was admitted to the hospital back in September for worsening shortness of breath was found to have multivessel coronary artery disease had a CABG was subsequently complicated by renal failure and pneumothorax was discharged to rehab had been there for approximately a month.  He was discharged from there on Friday.  He had persistent diarrhea throughout this whole time and he started dialysis as an outpatient yesterday which was able to get a full treatment secondary to his hypotension.  He comes here because of worsening shortness of breath.  He also has a cough.  Fever last night of 103.  Overall weakness.  Has some abdominal distention and discomfort along with his diarrhea.  States he feels real tight in the bottom of his abdomen and it is more distended.  Has had some episodes where he felt like he was going to pass out.  No blood in his stool.  No sick contacts other than being in the hospital.  He does take Midridine as needed for hypotension.  No other associated modifying symptoms.   Past Medical History:  Diagnosis Date  . Anemia   . Asthma   . Atrial fibrillation ([place]) 12/08/2013  . CHF (congestive heart failure) ([place])   . Chronic joint pain    "from RA" (10/01/2016)  . CKD (chronic kidney disease), stage III ([place])   . Constipation    takes stool softener daily  . COPD (chronic obstructive pulmonary disease) ([place])   . Coronary artery disease   . Coronary artery disease involving native coronary artery of native heart with angina pectoris ([place]) 09/30/2016  . Dysrhythmia    a-fib  . GERD (gastroesophageal reflux disease)    takes Zantac daily   . History of blood transfusion    "related to one of my ORs"    . Hyperlipidemia    takes Pravastatin daily  . Hypertension    takes Cardizem,Proscar,and Lisinopril daily  . Hypothyroidism   . Impaired hearing    wears hearing aids  . Joint swelling    "from RA" (10/01/2016)  . Peripheral neuropathy   . Pneumonia 2017  . Rheumatoid arthritis ([place])    "all over; take orencia  q 4 wks" (10/01/2016)  . S/P CABG x 2 10/09/2016   LIMA to LAD, SVG to OM, EVH via right thigh  . S/P Maze operation for atrial fibrillation 10/09/2016   Complete bilateral atrial lesion set using bipolar radiofrequency and cryothermy ablation with clipping of LA appendage  . Type II diabetes mellitus [facility])     Patient Active Problem List   Diagnosis Date Noted  . Chronic anticoagulation   . Dependence on renal dialysis ([place])   . Fungal rash of trunk   . Right forearm pain   . Finger pain, right   . Primary osteoarthritis of right elbow   . Hemodialysis-associated hypotension   . Labile blood glucose   . Shortness of breath   . Type 2 diabetes mellitus with peripheral neuropathy (HCC)   . PAF (paroxysmal atrial fibrillation) ([place])   . Debility 11/03/2016  . Coronary artery disease involving native heart without angina pectoris   . AKI (acute kidney injury) ([place])   . Dysphagia   .  Chronic diastolic heart failure ([place])   . Benign prostatic hyperplasia   . Hx of CABG   . S/P CABG (coronary artery bypass graft)   . Acute blood loss anemia   . Anemia of chronic disease   . Chronic obstructive pulmonary disease ([place])   . Cardiogenic shock ([place])   . Spontaneous pneumothorax   . Acute respiratory failure with hypercapnia ([place])   . Acute renal failure ([place])   . S/P CABG x 2 + maze procedure 10/09/2016  . S/P Maze operation for atrial fibrillation 10/09/2016  . Coronary artery disease involving native coronary artery of native heart with angina pectoris ([place]) 09/30/2016  . Fusarium infection ([place]) 01/10/2016  . Dyspnea on exertion 09/12/2015  . Chronic systolic CHF  (congestive heart failure) ([place])   . Chronic kidney disease (CKD), stage IV (severe) ([place])   . Cellulitis of left lower extremity   . Pulmonary hypertension ([place])   . Paroxysmal atrial fibrillation (HCC)   . Other emphysema ([place])   . Bronchiectasis without complication ([place])   . Other specified hypothyroidism   . CKD (chronic kidney disease) stage 4, GFR 15-29 ml/min (HCC) 08/18/2014  . Acute on chronic systolic heart failure ([place]) 08/18/2014  . Bronchiectasis ([place]) 08/18/2014  . Hypothyroidism 08/18/2014  . Bronchiectasis without acute exacerbation ([place]) 12/29/2013  . LVH (left ventricular hypertrophy) due to hypertensive disease 12/10/2013  . Atrial fibrillation ([place]) 12/08/2013  . CHF (congestive heart failure) ([place]) 12/07/2013  . Chronic obstructive airway disease with asthma ([place]) 12/07/2013  . Thyroid goiter 02/01/2013  . Low TSH level 05/18/2012  . Right thyroid nodule 05/18/2012  . Diabetes ([place]) 05/18/2012  . HLD (hyperlipidemia) 05/18/2012  . Rheumatoid arthritis ([place]) 05/18/2012  . Essential hypertension 05/18/2012  . GERD (gastroesophageal reflux disease) 05/18/2012  . Anemia 05/18/2012  . Knee pain 02/12/2012    Past Surgical History:  Procedure Laterality Date  . ANKLE FUSION Left 2014  . BIOPSY THYROID  2014  . CARDIAC CATHETERIZATION  09/30/2016  . CARPAL TUNNEL RELEASE Bilateral 1990's  . CATARACT EXTRACTION W/ INTRAOCULAR LENS  IMPLANT, BILATERAL Bilateral 1990's  . COLONOSCOPY    . JOINT REPLACEMENT    . REPAIR TENDONS FOOT Left 1946; 1984   "farming accident; repaired tendons both times"  . SHOULDER ARTHROSCOPY W/ ROTATOR CUFF REPAIR Right 1990's  . TONSILLECTOMY  1930's  . TOTAL KNEE ARTHROPLASTY Right 12/16/2010   Procedure: TOTAL KNEE ARTHROPLASTY; rt Surgeon: Rudean Haskell, MD;  Location: [place];  Service: Orthopedics;  Laterality: Right;  . TOTAL SHOULDER REPLACEMENT Left 03/2010    Current Outpatient Rx  . Order #: [id number] Class: No Print  .  Order #: [id number] Class: No Print  . Order #: [id number] Class: Print  . Order #: [id number] Class: No Print  . Order #: [id number] Class: Print  . Order #: [id number] Class: No Print  . Order #: [id number] Class: Print  . Order #: [id number] Class: Print  . Order #: [id number] Class: Print  . Order #: [id number] Class: Print  . Order #: [id number] Class: Print  . Order #: [id number] Class: Print  . Order #: [id number] Class: Print  . Order #: [id number] Class: Print  . Order #: [id number] Class: Print  . Order #: [id number] Class: Print  . Order #: [id number] Class: Print  . Order #: [id number] Class: Print  . Order #: [id number] Class: Print  . Order #: [id number] Class: Print  . Order #: [id number] Class: No Print  . Order #: [id number] Class: No Print  . Order #: [id number] Class: Print  . Order #: [id number] Class: Print  . Order #:  [id number] Class: Print  . Order #: [id number] Class: Print    Allergies Patient has no known allergies.  Family History  Problem Relation Age of Onset  . Cancer Mother        breast  . Aneurysm Father     Social History Social History   Tobacco Use  . Smoking status: Former Smoker    Packs/day: 1.00    Years: 15.00    Pack years: 15.00    Types: Cigarettes    Last attempt to quit: 09/01/1965    Years since quitting: 51.2  . Smokeless tobacco: Never Used  . Tobacco comment: quit smoking in 1967  Substance Use Topics  . Alcohol use: No    Alcohol/week: 0.0 oz    Comment: 10/01/2016  "stopped drinking in 1967; about an alcoholic when I quit"  . Drug use: No    Review of Systems  All other systems negative except as documented in the HPI. All pertinent positives and negatives as reviewed in the HPI. ____________________________________________   PHYSICAL EXAM:  VITAL SIGNS: ED Triage Vitals [12/09/2016 1444]  Enc Vitals Group     BP (!) 79/47     Pulse Rate (!) 43     Resp 18     Temp 99.1 F (37.3 C)     Temp Source Oral     SpO2 (!) 88 %    Constitutional:  Alert and oriented. Well appearing and in mild distress. Eyes: Conjunctivae are normal. PERRL. EOMI. Head: Atraumatic. Nose: No congestion/rhinnorhea. Mouth/Throat: Mucous membranes are dry.  Oropharynx non-erythematous. Neck: No stridor.  No meningeal signs.   Cardiovascular: Normal rate, regular rhythm. Hypotensive improves with movement. Good peripheral circulation. Grossly normal heart sounds.   Respiratory: mild resp distress, not speaking in full sentences. Decreased BS with rhonchi bilaterally associated with cough.  Gastrointestinal: Soft and ttp with mild distension. No rebound or guarding.  Musculoskeletal: No lower extremity tenderness but has mild edema. No gross deformities of extremities. Neurologic:  Normal speech and language. No gross focal neurologic deficits are appreciated.  Skin:  Skin is warm, dry and intact. No rash noted.   ____________________________________________   LABS (all labs ordered are listed, but only abnormal results are displayed)  Labs Reviewed  COMPREHENSIVE METABOLIC PANEL - Abnormal; Notable for the following components:      Result Value   Potassium 5.2 (*)    Chloride 94 (*)    Glucose, Bld 153 (*)    BUN 38 (*)    Creatinine, Ser 4.81 (*)    Calcium 8.5 (*)    Total Protein 6.0 (*)    Albumin 2.3 (*)    Total Bilirubin 1.6 (*)    GFR calc non Af Amer 10 (*)    GFR calc Af Amer 12 (*)    Anion gap 16 (*)    All other components within normal limits  CBC WITH DIFFERENTIAL/PLATELET - Abnormal; Notable for the following components:   WBC 42.7 (*)    RBC 3.49 (*)    Hemoglobin 10.6 (*)    HCT 34.1 (*)    RDW 23.5 (*)    Neutro Abs 36.3 (*)    Lymphs Abs 4.7 (*)    Monocytes Absolute 1.7 (*)    All other components within normal limits  I-STAT CG4 LACTIC ACID, ED - Abnormal; Notable for the following components:   Lactic Acid, Venous 4.80 (*)    All other components within normal limits  CULTURE, BLOOD (ROUTINE X 2)  CULTURE,  BLOOD (ROUTINE X 2)  GASTROINTESTINAL PANEL BY PCR, STOOL (REPLACES STOOL CULTURE)  C DIFFICILE QUICK SCREEN W PCR REFLEX  URINALYSIS, ROUTINE W REFLEX MICROSCOPIC  I-STAT CG4 LACTIC ACID, ED   ____________________________________________  EKG   EKG Interpretation  Date/Time:  Wednesday November 26 2016 14:54:24 EST Ventricular Rate:  96 PR Interval:    QRS Duration: 128 QT Interval:  401 QTC Calculation: 507 R Axis:   141 Text Interpretation:  Atrial fibrillation Nonspecific intraventricular conduction delay Anterior infarct, old Borderline T abnormalities, inferior leads similar to 10/18 with some likely lead placemetn chagnes Confirmed by Merrily Pew [phone]) on 11/29/2016 3:43:16 PM       ____________________________________________  RADIOLOGY  Ct Abdomen Pelvis Wo Contrast  Result Date: 11/29/2016 CLINICAL DATA:  Generalized weakness with nausea EXAM: CT ABDOMEN AND PELVIS WITHOUT CONTRAST TECHNIQUE: Multidetector CT imaging of the abdomen and pelvis was performed following the standard protocol without IV contrast. COMPARISON:  10/16/2016 FINDINGS: Lower chest: Moderate left pleural effusion is noted. Mild left lower lobe atelectatic changes are seen. No other focal infiltrate is noted. Hepatobiliary: No focal liver abnormality is seen. No gallstones, gallbladder wall thickening, or biliary dilatation. Pancreas: Unremarkable. No pancreatic ductal dilatation or surrounding inflammatory changes. Spleen: Normal in size without focal abnormality. Adrenals/Urinary Tract: The adrenal glands are within normal limits. The kidneys demonstrates some cystic change stable from the previous exam. No renal calculi or obstructive changes are noted. The bladder is decompressed. Stomach/Bowel: Colon is well distended with both fluid and fecal material. No obstructive changes are seen. The appendix is not well visualized although no inflammatory changes to suggest appendicitis are seen. Mildly  prominent loops of small bowel are noted in the mid abdomen containing fluid which may represent some mild enteritis. No definitive transition zone is identified. The distal aspect of the ileum however there are a few loops which have changes suggestive of pneumatosis. No portal venous gas is noted. Very mild inflammatory changes are noted surrounding the distal ileum. Vascular/Lymphatic: Aortic atherosclerosis. No enlarged abdominal or pelvic lymph nodes. Reproductive: Prostate is unremarkable. Other: No abdominal wall hernia or abnormality. No abdominopelvic ascites. Musculoskeletal: Degenerative changes of lumbar spine are noted. IMPRESSION: Mild small bowel distention without definitive transition zone. A few of the loops in the distal ileum demonstrate findings suggestive of pneumatosis. This could represent some very early distal small bowel ischemia. No portal venous air is noted. Correlation to the physical exam is recommended. Moderate left pleural effusion with associated atelectatic changes. No other focal abnormality is seen. Critical Value/emergent results were called by telephone at the time of interpretation on 11/14/2016 at 5:05 pm to Dr. Merrily Pew , who verbally acknowledged these results. Electronically Signed   By: Inez Catalina M.D.   On: 11/19/2016 17:06   Dg Chest 2 View  Result Date: 11/14/2016 CLINICAL DATA:  Increasing weakness, diarrhea and vomiting since last night. Began hemodialysis approximately 6 weeks ago. EXAM: CHEST  2 VIEW COMPARISON:  Radiographs 11/13/2016 and 11/07/2016. FINDINGS: Right IJ dialysis catheters appear unchanged at the level of the superior cavoatrial junction. There is stable cardiomegaly status post CABG and left atrial appendage clipping. There are lower lung volumes with increased patchy opacities at both lung bases, likely atelectasis. Vascular congestion is present without overt pulmonary edema, confluent airspace opacity or significant pleural  effusion. IMPRESSION: Vascular congestion with increased patchy bibasilar opacities, likely atelectasis. No definite edema. Electronically Signed   By: Richardean Sale M.D.   On: 12/04/2016  16:52    ____________________________________________   PROCEDURES  Procedure(s) performed:   Procedures  CRITICAL CARE Performed by: Merrily Pew Total critical care time: 45 minutes Critical care time was exclusive of separately billable procedures and treating other patients. Critical care was necessary to treat or prevent imminent or life-threatening deterioration. Critical care was time spent personally by me on the following activities: development of treatment plan with patient and/or surrogate as well as nursing, discussions with consultants, evaluation of patient's response to treatment, examination of patient, obtaining history from patient or surrogate, ordering and performing treatments and interventions, ordering and review of laboratory studies, ordering and review of radiographic studies, pulse oximetry and re-evaluation of patient's condition.  ____________________________________________   INITIAL IMPRESSION / ASSESSMENT AND PLAN / ED COURSE  Pertinent labs & imaging results that were available during my care of the patient were reviewed by me and considered in my medical decision making (see chart for details).  Patient with some hypotension is seems to be a little bit below his baseline likely secondary to hypovolemia and/or infection.  Significant diarrhea so we will send it for C. difficile as he had a recent long hospital stay but will also check for other bacterial causes.  Also with cough, sob will xr to eval for e/o pneumonia. UA if able to obtain sample. Plan for CT wo contrast if he becomes more stable. EKG okay.  Parietotemporal last night of 103 so we will cover with antibiotics.  We will start a fluid infusion as it seems like his blood pressure is very dependent on  positioning.   Lactic acid 4.80. Will bolus a couple liters of fluid. Already on abx to cover for possible sepsis.   WBC very high, more concern for Cdiff, discussed with pharmacist, will add on PO Vancomycin for concern of severe Cdiff.   CT scan with pneumatosis in bowel. Discussed with General Surgery, Dr. Donne Hazel who will consult.  Reevaluation after his fluids and RR similar, alert, still with wet cough. Lungs with some rhonchi and crackles. Still needs 1 more liter for sepsis. BP improved. Due to multiple problems, will discuss with CCM for admission to ICU.    ____________________________________________  FINAL CLINICAL IMPRESSION(S) / ED DIAGNOSES  Final diagnoses:  None     MEDICATIONS GIVEN DURING THIS VISIT:  Medications  vancomycin (VANCOCIN) 2,000 mg in sodium chloride 0.9 % 500 mL IVPB (2,000 mg Intravenous New Bag/Given 12/03/2016 1606)  vancomycin (VANCOCIN) 50 mg/mL oral solution 125 mg (not administered)  sodium chloride 0.9 % bolus 1,000 mL (not administered)  ceFEPIme (MAXIPIME) 2 g in dextrose 5 % 50 mL IVPB (0 g Intravenous Stopped 12/10/2016 1702)  sodium chloride 0.9 % bolus 1,000 mL (1,000 mLs Intravenous New Bag/Given 11/28/2016 1559)  sodium chloride 0.9 % bolus 1,000 mL (1,000 mLs Intravenous New Bag/Given 11/22/2016 1559)    NEW OUTPATIENT MEDICATIONS STARTED DURING THIS VISIT:  This SmartLink is deprecated. Use AVSMEDLIST instead to display the medication list for a patient.  Note:  This document was prepared using Dragon voice recognition software and may include unintentional dictation errors.   Merrily Pew, MD 11/29/2016 [phone]

## 2016-11-26 NOTE — Consult Note (Addendum)
PULMONARY / CRITICAL CARE MEDICINE H&P   Name: [name] MRN: [id number] DOB: 11-22-1930    ADMISSION DATE:  11/22/2016 CONSULTATION DATE:  11/23/2016  REFERRING MD:  Dr. Dayna Barker  CHIEF COMPLAINT:  SIRS  HISTORY OF PRESENT ILLNESS:   83 year old male with PMH as below who presents to ER with ongoing diarrhea, worsening shortness of breath, cough, and fever of 103, with abdominal distention and discomfort.  Dialysis yesterday was not fully completed due to hypotension.  Patient is on midodrine for chronic hypotension.   Of note, patient had hospitalization in September for SOB, found to have multivessel CAD s/p CABG 2/53/66 complicated by renal failure and pneumothorax.  Patient discharged to rehab over the last month but discharged home on Friday. Now ESRD on HD    In ER, found to have lactic acid of 4.80, WBC 42.7, K 5.2, stool sent for culture/ Cdiff, CT abd concerning for possible early distal small bowel ischemia.  CXR with vascular congestion and patchy bibasilar opacities.  MAP in ER ranging from 57 to 73. Treated with 2L NS, cefepime and vancomycin.  Worsening SOB after fluid resuscitation.  PCCM called to admit.   PAST MEDICAL HISTORY :  He  has a past medical history of Anemia, Asthma, Atrial fibrillation ([place]) (12/08/2013), CHF (congestive heart failure) ([place]), Chronic joint pain, CKD (chronic kidney disease), stage III ([place]), Constipation, COPD (chronic obstructive pulmonary disease) ([place]), Coronary artery disease, Coronary artery disease involving native coronary artery of native heart with angina pectoris ([place]) (09/30/2016), Dysrhythmia, GERD (gastroesophageal reflux disease), History of blood transfusion, Hyperlipidemia, Hypertension, Hypothyroidism, Impaired hearing, Joint swelling, Peripheral neuropathy, Pneumonia (2017), Rheumatoid arthritis ([place]), S/P CABG x 2 (10/09/2016), S/P Maze operation for atrial fibrillation (10/09/2016), and Type II diabetes mellitus ([place]).  PAST  SURGICAL HISTORY: He  has a past surgical history that includes Total shoulder replacement (Left, 03/2010); Shoulder arthroscopy w/ rotator cuff repair (Right, 1990's); Repair tendons foot (Left, 1946; 1984); Cataract extraction w/ intraocular lens  implant, bilateral (Bilateral, 1990's); Total knee arthroplasty (Right, 12/16/2010); Ankle Fusion (Left, 2014); Tonsillectomy (1930's); Colonoscopy; Carpal tunnel release (Bilateral, 1990's); Biopsy thyroid (2014); Joint replacement; Cardiac catheterization (09/30/2016); ARTERIOVENOUS  FISTULA CREATION right arm (Right, 10/28/2016); INSERTION OF DIALYSIS CATHETER (N/A, 10/28/2016); CORONARY ARTERY BYPASS GRAFTING (CABG) x two, using left internal mammary artery and right leg greater saphenous vein harvested endoscopically (N/A, 10/09/2016); MAZE (N/A, 10/09/2016); TRANSESOPHAGEAL ECHOCARDIOGRAM (TEE) (N/A, 10/09/2016); CLIPPING OF ATRIAL APPENDAGE (10/09/2016); RIGHT/LEFT HEART CATH AND CORONARY ANGIOGRAPHY (N/A, 09/30/2016); LEFT HALLUX AMPUTATION AT THE METATARSOPHALANGEAL JOINT (Left, 11/08/2015); REMOVAL FOREIGN BODY RIGHT INDEX FINGER (Right, 09/06/2013); RIGHT THYROIDECTOMY LOBECTOMY (Right, 02/01/2013); ANKLE FUSION (Left, 10/02/2011); and TOTAL KNEE ARTHROPLASTY (Right, 12/16/2010).  No Known Allergies  No current facility-administered medications on file prior to encounter.    Current Outpatient Medications on File Prior to Encounter  Medication Sig  . abatacept (ORENCIA) 250 MG injection Infusion due again October 2018 but with recent heart surgery, would wait until at least November  . acetaminophen (TYLENOL) 325 MG tablet Take 2 tablets (650 mg total) by mouth every 6 (six) hours as needed for mild pain.  [name] amiodarone (PACERONE) 200 MG tablet Take 1 tablet (200 mg total) daily by mouth.  [name] aspirin 81 MG chewable tablet Chew 1 tablet (81 mg total) by mouth daily.  [name] atorvastatin (LIPITOR) 40 MG tablet Take 1 tablet (40 mg total) daily at 6 PM by mouth.  .  camphor-menthol (SARNA) lotion Apply topically as needed for itching.  [name]  diclofenac sodium (VOLTAREN) 1 % GEL Apply 2 g 4 (four) times daily topically.  . fluticasone furoate-vilanterol (BREO ELLIPTA) 100-25 MCG/INH AEPB Inhale 1 puff daily into the lungs.  . gabapentin (NEURONTIN) 300 MG capsule Take 1 capsule (300 mg total) 2 (two) times daily by mouth.  [name] guaiFENesin (ROBITUSSIN) 100 MG/5ML SOLN Take 20 mLs (400 mg total) every 4 (four) hours as needed by mouth for cough or to loosen phlegm.  . Hydrocortisone (GERHARDT'S BUTT CREAM) CREA Apply 1 application 2 (two) times daily topically.  . insulin detemir (LEVEMIR) 100 unit/ml SOLN Inject 0.15 mLs (15 Units total) at bedtime into the skin.  [name] levothyroxine (SYNTHROID, LEVOTHROID) 25 MCG tablet Take 1 tablet (25 mcg total) daily before breakfast by mouth.  . Maltodextrin-Xanthan Gum (RESOURCE THICKENUP CLEAR) POWD Take 120 g as needed by mouth (thicken liquids to nectar consistency).  . metoprolol tartrate (LOPRESSOR) 25 MG tablet Take 0.5 tablets (12.5 mg total) 2 (two) times daily by mouth.  . midodrine (PROAMATINE) 10 MG tablet Take 2 tablets (20 mg total) 3 (three) times daily with meals by mouth.  . multivitamin (RENA-VIT) TABS tablet Take 1 tablet at bedtime by mouth.  . predniSONE (DELTASONE) 5 MG tablet Take 1 tablet (5 mg total) daily with breakfast by mouth.  . pregabalin (LYRICA) 25 MG capsule Take 1 capsule (25 mg total) daily by mouth.  . ranitidine (ZANTAC) 150 MG tablet Take 1 tablet (150 mg total) daily by mouth.  . warfarin (COUMADIN) 2.5 MG tablet Take 1 tablet (2.5 mg total) daily at 6 PM by mouth.  . feeding supplement, GLUCERNA SHAKE, (GLUCERNA SHAKE) LIQD Take 237 mLs by mouth 2 (two) times daily between meals.  . finasteride (PROSCAR) 5 MG tablet Take 1 tablet (5 mg total) at bedtime by mouth. (Patient not taking: Reported on 11/21/2016)  . lidocaine (LIDODERM) 5 % Place 1 patch daily onto the skin. Remove & Discard patch  within 12 hours or as directed by MD  . tamsulosin (FLOMAX) 0.4 MG CAPS capsule Take 1 capsule (0.4 mg total) at bedtime by mouth.  . traMADol (ULTRAM) 50 MG tablet Take 1 tablet (50 mg total) every 12 (twelve) hours as needed by mouth for severe pain.    FAMILY HISTORY:  His indicated that his mother is deceased. He indicated that his father is deceased.   SOCIAL HISTORY: He  reports that he quit smoking about 51 years ago. His smoking use included cigarettes. He has a 15.00 pack-year smoking history. he has never used smokeless tobacco. He reports that he does not drink alcohol or use drugs.  REVIEW OF SYSTEMS:  Patient says "feel good" Bolds are positive  Constitutional: weight loss, gain, night sweats, Fevers, chills, fatigue .  HEENT: headaches, Sore throat, sneezing, nasal congestion, post nasal drip, Difficulty swallowing, Tooth/dental problems, visual complaints visual changes, ear ache CV:  chest pain, radiates:,Orthopnea, PND, swelling in lower extremities, dizziness, palpitations, syncope.  GI  heartburn, indigestion, abdominal pain, nausea, vomiting, diarrhea, change in bowel habits, loss of appetite, bloody stools.  Resp: cough, productive:, hemoptysis, dyspnea, chest pain, pleuritic.  Skin: rash or itching or icterus GU: dysuria, change in color of urine, urgency or frequency. flank pain, hematuria  MS: joint pain or swelling. decreased range of motion  Psych: change in mood or affect. depression or anxiety.  Neuro: difficulty with speech, weakness, numbness, ataxia    SUBJECTIVE:    VITAL SIGNS: BP (!) 104/53   Pulse 81   Temp 99.1  F (37.3 C) (Oral)   Resp 16   SpO2 97%   HEMODYNAMICS:    VENTILATOR SETTINGS:    INTAKE / OUTPUT: No intake/output data recorded.  PHYSICAL EXAMINATION: General:  Elderly male in NAD Neuro:  Spontaneously awake, alert, oriented to self, place.  HEENT:  [place]/AT, PERRL, no JVD Cardiovascular:  IRIR, no MRG Lungs:  Coarse  bilateral.  Abdomen:  Soft, somewhat distended. Diffusely tender to deep palpation.  Musculoskeletal:  No acute deformity or ROM limitation Skin:  Grossly intact  LABS:  BMET Recent Labs  Lab 11/21/16 0738 12/02/2016 1455  NA 134* 136  K 4.5 5.2*  CL 96* 94*  CO2 28 26  BUN 18 38*  CREATININE 3.70* 4.81*  GLUCOSE 88 153*    Electrolytes Recent Labs  Lab 11/21/16 0738 12/10/2016 1455  CALCIUM 8.1* 8.5*  PHOS 2.8  --     CBC Recent Labs  Lab 11/21/16 0738 11/21/2016 1455  WBC 11.7* 42.7*  HGB 8.6* 10.6*  HCT 27.7* 34.1*  PLT 171 275    Coag's Recent Labs  Lab 11/20/16 0622 11/21/16 0457  INR 2.24 2.84    Sepsis Markers Recent Labs  Lab 12/09/2016 1540 11/24/2016 1755  LATICACIDVEN 4.80* 2.27*    ABG No results for input(s): PHART, PCO2ART, PO2ART in the last 168 hours.  Liver Enzymes Recent Labs  Lab 11/21/16 0738 12/02/2016 1455  AST  --  31  ALT  --  24  ALKPHOS  --  97  BILITOT  --  1.6*  ALBUMIN 2.2* 2.3*    Cardiac Enzymes No results for input(s): TROPONINI, PROBNP in the last 168 hours.  Glucose Recent Labs  Lab 11/20/16 2148 11/20/16 2328 11/21/16 0642 11/21/16 0807 11/21/16 1003 11/21/16 1202  GLUCAP 61* 135* 57* 97 95 107*    Imaging Ct Abdomen Pelvis Wo Contrast  Result Date: 11/25/2016 CLINICAL DATA:  Generalized weakness with nausea EXAM: CT ABDOMEN AND PELVIS WITHOUT CONTRAST TECHNIQUE: Multidetector CT imaging of the abdomen and pelvis was performed following the standard protocol without IV contrast. COMPARISON:  10/16/2016 FINDINGS: Lower chest: Moderate left pleural effusion is noted. Mild left lower lobe atelectatic changes are seen. No other focal infiltrate is noted. Hepatobiliary: No focal liver abnormality is seen. No gallstones, gallbladder wall thickening, or biliary dilatation. Pancreas: Unremarkable. No pancreatic ductal dilatation or surrounding inflammatory changes. Spleen: Normal in size without focal  abnormality. Adrenals/Urinary Tract: The adrenal glands are within normal limits. The kidneys demonstrates some cystic change stable from the previous exam. No renal calculi or obstructive changes are noted. The bladder is decompressed. Stomach/Bowel: Colon is well distended with both fluid and fecal material. No obstructive changes are seen. The appendix is not well visualized although no inflammatory changes to suggest appendicitis are seen. Mildly prominent loops of small bowel are noted in the mid abdomen containing fluid which may represent some mild enteritis. No definitive transition zone is identified. The distal aspect of the ileum however there are a few loops which have changes suggestive of pneumatosis. No portal venous gas is noted. Very mild inflammatory changes are noted surrounding the distal ileum. Vascular/Lymphatic: Aortic atherosclerosis. No enlarged abdominal or pelvic lymph nodes. Reproductive: Prostate is unremarkable. Other: No abdominal wall hernia or abnormality. No abdominopelvic ascites. Musculoskeletal: Degenerative changes of lumbar spine are noted. IMPRESSION: Mild small bowel distention without definitive transition zone. A few of the loops in the distal ileum demonstrate findings suggestive of pneumatosis. This could represent some very early distal small bowel  ischemia. No portal venous air is noted. Correlation to the physical exam is recommended. Moderate left pleural effusion with associated atelectatic changes. No other focal abnormality is seen. Critical Value/emergent results were called by telephone at the time of interpretation on 11/20/2016 at 5:05 pm to Dr. Merrily Pew , who verbally acknowledged these results. Electronically Signed   By: Inez Catalina M.D.   On: 11/14/2016 17:06   Dg Chest 2 View  Result Date: 11/22/2016 CLINICAL DATA:  Increasing weakness, diarrhea and vomiting since last night. Began hemodialysis approximately 6 weeks ago. EXAM: CHEST  2 VIEW  COMPARISON:  Radiographs 11/13/2016 and 11/07/2016. FINDINGS: Right IJ dialysis catheters appear unchanged at the level of the superior cavoatrial junction. There is stable cardiomegaly status post CABG and left atrial appendage clipping. There are lower lung volumes with increased patchy opacities at both lung bases, likely atelectasis. Vascular congestion is present without overt pulmonary edema, confluent airspace opacity or significant pleural effusion. IMPRESSION: Vascular congestion with increased patchy bibasilar opacities, likely atelectasis. No definite edema. Electronically Signed   By: Richardean Sale M.D.   On: 12/08/2016 16:52     STUDIES:  CT abd/pelvis 11/14 > Mild small bowel distention without definitive transition zone. A few of the loops in the distal ileum demonstrate findings suggestive of pneumatosis. This could represent some very early distal small bowel ischemia. No portal venous air is noted. Correlation to the physical exam is recommended. Moderate left pleural effusion with associated atelectatic changes. No other focal abnormality is seen.  CULTURES: Blood 11/14 > Urine 11/14 > GI panel PCR 11/14 > Cdif 11/14 > toxin neg, antigen pos  ANTIBIOTICS: Cefepime 11/14 > Vancomycin 11/14 >  SIGNIFICANT EVENTS: 09/2016 CABG, ICU stay 1 month, 2 weeks rehab dc home 11/9 11/13 half HD due to hypotension 11/14 > admit   LINES/TUBES:   DISCUSSION: 75 M with recent CABG and prolonged ICU stay now ESRD. Admitted for severe sepsis 11/14 with GI likely source.   ASSESSMENT / PLAN:  PULMONARY A: Acute hypoxemic respiratory failure. Likely related to volume overload, ddx includes HCAP and effusion.  L effusion COPD without acute exacerbation  P:   Supplemental O2 as needed to keep SpO2 > 92% If no improvement may need thoracentesis to rule out empyema, although suspect effusion is volume related.  Budesonide, brovana, en lieu of home Breo  CARDIOVASCULAR A:   Severe sepsis CAD s/p CABG 09/2016 Afib s/p MAZE 09/2016. Currently AF with controlled vent response.   P:  ICU monitoring DNR if arrests May need pressors and likely at limit with IVF resuscitation Could try ClearSight eval to determine volume status Holding home metoprolol Holding home midodrine, amiodarone, asa while NPO  RENAL A:   ESRD  Hyperkalemia  P:   Consult nephrology in AM Repeat BMP in AM  GASTROINTESTINAL A:   Likely Colitis C. Diff antigen pos toxin neg Nausea/vomiting/diarrhea Pneumatosis on CT  P:   NPO Poor surgical candidate per Gen Surg ABX as below  HEMATOLOGIC A:   Warfarin coagulopathy  P:  Assess INR Holding warfarin while NPO Heparin per pharmacy  INFECTIOUS A:   Sepsis Etiology not entirely clear. Possibly colitis based on symptoms and pneumatosis, however, CXR does not rule out PNA. Cdiff toxin negative.   P:   Vancomycin and cefepime as above Blood, urine cultures, sputum GI panel RVP  ENDOCRINE A:   Hypothyroid DM RA on prednisone, orencia  P:   Continue synthroid CBG monitoring and SSI Stress steroids  Orencia on hold since september  NEUROLOGIC A:   Acute metabolic encephalopathy  P:   monitor   FAMILY  - Updates: Family updated at length. Understand that he will be admitted to ICU and is critically ill. DNR per their previous discussions.   - Inter-disciplinary family meet or Palliative Care meeting due by:  11/22   Georgann Housekeeper, AGACNP-BC [place] [facility] Pager [phone] or [phone]  11/18/2016 7:03 PM  THIS NOTE IS THE H&P    STAFF NOTE  I, Dr Seward Carol have personally reviewed patient's available data, including medical history, events of note, physical examination and test results as part of my evaluation. I have discussed with NP [facility] and other care providers such as pharmacist, RN and Surgeon.  In addition,  I personally evaluated patient  83 yr old male  wit PMHx Afib s/p MAZE, CHF, ESRD on Hd, COPD, CAD, Hypothyroidism and T2DM presenting with ongoing diarrhea, SOB, cough and fever of 103deg F.  Assessment:  Sepsis secondary to Colitis ( C.diff Ag+ Toxin -) with evidence of small bowel ischemia and pneumatosis on CT Ab/pelv  PLAN NEURO:  Acute Metabolic Encephalopathy  Remains NPO place on Aspiration precautions Keep HOB elevated  Neurohecks  CARDIO:  Sepsis-> Hypotension improved with IVF Also received Hydrocort 50 Q 6 IV MAP goal >33mmHg  Adequately resuscitated at this point if BP drops consider CVC catheter and initiating vasopressors. LA improved from 4.8->2.27 Small bowel ischemia noted on CT- Surgery following Afib s/p MAZE rate controlled - amiodarone held at this time Hold all antihypertensives  PULM: H/o COPD Restarted on Bronchodilators Pulm toilet Supp O2 to keep Sats >92% Noted small left pleural effusion with atelectasis  RENAL ESRD on HD last done on 11/14/16 Via Permacath  Electrolytes K+ 5.2 AG 16 Bicarb 26 Defer to Nephrology regarding next HD and changes in UF goal  ID: Blood cx x 2 Leukocytosis left shift fever tender abdomen on exam Suspect colitis as Sepsis source C.diff negative Continues on Vancomycin and Cefepime  HEME: Anemia of chronic disease On Aranesp 249mcg every Wed and Ferrlicet 175 x 8 doses which started on 10/26 Hgb 10.6 baseline 8 (most likely hemo-concentrated) No signs of active bleeding VTE ppx with Heparin [place] and SCDs  GI NPO Small bowel ischemia on CT noted No indication for surgical intervention at this time Supportive care. Surgery following appreciate their recs  ENDO H/o T2DM Received stress dose steroids ICU Glycemic protocol  BG goal 140-180mg /dl H/o Hypothyroidism Send TSH    Rest per NP Hoffman whose note is outlined above and that I agree with  The patient is critically ill with multiple organ systems failure and requires high complexity decision  making for assessment and support, frequent evaluation and titration of therapies, application of advanced monitoring technologies and extensive interpretation of multiple databases.  Critical Care Time devoted to patient care services described in this note is 16 Minutes. This time reflects time of care of this signee Dr Seward Carol. This critical care time does not reflect procedure time, or teaching time or supervisory time of PA/NP/Med student/Med Resident etc but could involve care discussion time    DISPOSITION: ICU CODE STATUS: DNR FAMILY:discussed currentl clinical condition. Need Goals of Care convo PROGNOSIS: Guarded CC TIME: 55 mins  Dr. Seward Carol [facility]  12/10/2016 7:59 PM

## 2016-11-27 ENCOUNTER — Inpatient Hospital Stay (HOSPITAL_COMMUNITY): Payer: Medicare Other

## 2016-11-27 LAB — GASTROINTESTINAL PANEL BY PCR, STOOL (REPLACES STOOL CULTURE)
ADENOVIRUS F40/41: NOT DETECTED
ASTROVIRUS: NOT DETECTED
CAMPYLOBACTER SPECIES: NOT DETECTED
CYCLOSPORA CAYETANENSIS: NOT DETECTED
Cryptosporidium: NOT DETECTED
ENTEROPATHOGENIC E COLI (EPEC): NOT DETECTED
ENTEROTOXIGENIC E COLI (ETEC): NOT DETECTED
Entamoeba histolytica: NOT DETECTED
Enteroaggregative E coli (EAEC): NOT DETECTED
Giardia lamblia: NOT DETECTED
NOROVIRUS GI/GII: NOT DETECTED
PLESIMONAS SHIGELLOIDES: NOT DETECTED
Rotavirus A: NOT DETECTED
SHIGA LIKE TOXIN PRODUCING E COLI (STEC): NOT DETECTED
Salmonella species: NOT DETECTED
Sapovirus (I, II, IV, and V): NOT DETECTED
Shigella/Enteroinvasive E coli (EIEC): NOT DETECTED
VIBRIO CHOLERAE: NOT DETECTED
VIBRIO SPECIES: NOT DETECTED
Yersinia enterocolitica: NOT DETECTED

## 2016-11-27 LAB — BASIC METABOLIC PANEL
ANION GAP: 14 (ref 5–15)
BUN: 46 mg/dL — AB (ref 6–20)
CHLORIDE: 103 mmol/L (ref 101–111)
CO2: 22 mmol/L (ref 22–32)
Calcium: 8 mg/dL — ABNORMAL LOW (ref 8.9–10.3)
Creatinine, Ser: 4.79 mg/dL — ABNORMAL HIGH (ref 0.61–1.24)
GFR calc Af Amer: 12 mL/min — ABNORMAL LOW (ref >60)
GFR, EST NON AFRICAN AMERICAN: 10 mL/min — AB (ref >60)
Glucose, Bld: 119 mg/dL — ABNORMAL HIGH (ref 65–99)
POTASSIUM: 4.6 mmol/L (ref 3.5–5.1)
SODIUM: 139 mmol/L (ref 135–145)

## 2016-11-27 LAB — CBC
HCT: 31.9 % — ABNORMAL LOW (ref 39.0–52.0)
Hemoglobin: 10 g/dL — ABNORMAL LOW (ref 13.0–17.0)
MCH: 30.3 pg (ref 26.0–34.0)
MCHC: 31.3 g/dL (ref 30.0–36.0)
MCV: 96.7 fL (ref 78.0–100.0)
PLATELETS: 228 10*3/uL (ref 150–400)
RBC: 3.3 MIL/uL — AB (ref 4.22–5.81)
RDW: 23.1 % — ABNORMAL HIGH (ref 11.5–15.5)
WBC: 29.3 10*3/uL — ABNORMAL HIGH (ref 4.0–10.5)

## 2016-11-27 LAB — GLUCOSE, CAPILLARY
GLUCOSE-CAPILLARY: 164 mg/dL — AB (ref 65–99)
GLUCOSE-CAPILLARY: 167 mg/dL — AB (ref 65–99)
Glucose-Capillary: 123 mg/dL — ABNORMAL HIGH (ref 65–99)
Glucose-Capillary: 146 mg/dL — ABNORMAL HIGH (ref 65–99)

## 2016-11-27 LAB — LACTIC ACID, PLASMA: LACTIC ACID, VENOUS: 1.9 mmol/L (ref 0.5–1.9)

## 2016-11-27 LAB — PROTIME-INR
INR: 2.61
PROTHROMBIN TIME: 27.7 s — AB (ref 11.4–15.2)

## 2016-11-27 LAB — MRSA PCR SCREENING: MRSA BY PCR: INVALID — AB

## 2016-11-27 LAB — TROPONIN I: TROPONIN I: 0.21 ng/mL — AB (ref <0.03)

## 2016-11-27 LAB — PROCALCITONIN
PROCALCITONIN: 11.22 ng/mL
Procalcitonin: 9.19 ng/mL

## 2016-11-27 LAB — PHOSPHORUS: Phosphorus: 4.8 mg/dL — ABNORMAL HIGH (ref 2.5–4.6)

## 2016-11-27 LAB — MAGNESIUM: MAGNESIUM: 1.8 mg/dL (ref 1.7–2.4)

## 2016-11-27 MED ORDER — CHLORHEXIDINE GLUCONATE 0.12 % MT SOLN
15.0000 mL | Freq: Two times a day (BID) | OROMUCOSAL | Status: DC
  Administered 2016-11-27 – 2016-12-04 (×12): 15 mL via OROMUCOSAL
  Filled 2016-11-27 (×8): qty 15

## 2016-11-27 MED ORDER — LIDOCAINE-PRILOCAINE 2.5-2.5 % EX CREA: 1.0000 "application " | TOPICAL_CREAM | CUTANEOUS | Status: DC | PRN

## 2016-11-27 MED ORDER — SODIUM CHLORIDE 0.9 % IV SOLN: 100.0000 mL | INTRAVENOUS | Status: DC | PRN

## 2016-11-27 MED ORDER — PENTAFLUOROPROP-TETRAFLUOROETH EX AERO: 1.0000 "application " | INHALATION_SPRAY | CUTANEOUS | Status: DC | PRN

## 2016-11-27 MED ORDER — HEPARIN SODIUM (PORCINE) 1000 UNIT/ML DIALYSIS
1000.0000 [IU] | INTRAMUSCULAR | Status: DC | PRN
  Administered 2016-11-28: 1000 [IU] via INTRAVENOUS_CENTRAL

## 2016-11-27 MED ORDER — LIDOCAINE HCL (PF) 1 % IJ SOLN: 5.0000 mL | INTRAMUSCULAR | Status: DC | PRN

## 2016-11-27 MED ORDER — ALTEPLASE 2 MG IJ SOLR: 2.0000 mg | Freq: Once | INTRAMUSCULAR | Status: DC | PRN

## 2016-11-27 MED ORDER — ORAL CARE MOUTH RINSE
15.0000 mL | Freq: Two times a day (BID) | OROMUCOSAL | Status: DC
  Administered 2016-11-27 – 2016-11-28 (×4): 15 mL via OROMUCOSAL

## 2016-11-27 NOTE — Care Management Note (Signed)
Case Management Note  Patient Details  Name: [name] MRN: [id number] Date of Birth: 11-11-1930  Subjective/Objective:    Pt admitted with SIRS                Action/Plan:  PTA from home however very recently discharged from SNF.  Pt is ESRD on hemodialysis T,Th,S at [facility].  Daughters have been support system since discharge from SNF - family states pt is not self sufficient.  Pt will need PT eval once stable     Expected Discharge Date:                  Expected Discharge Plan:     In-House Referral:  Clinical Social Work  Discharge planning Services  CM Consult  Post Acute Care Choice:    Choice offered to:     DME Arranged:    DME Agency:     HH Arranged:    HH Agency:     Status of Service:     If discussed at [employer], dates discussed:    Additional Comments:  Maryclare Labrador, RN 11/27/2016, 2:38 PM

## 2016-11-27 NOTE — Consult Note (Signed)
[facility] Renal Consultation Note    Indication for Consultation:  Management of ESRD/hemodialysis; anemia, hypertension/volume and secondary hyperparathyroidism PCP:  HPI: [name] is a 83 y.o. male with new ESRD on hemodialysis T,Th,S at [facility]. PMH of DM, CAD, CABG X2/MAZE 10/10/15, CHF, COPD, AFib on coumadin, hypothyroidism, rheumatoid arthritis. L Hallux amputation. He had extensive hospitalization following CABG/Maze procedure complicated AKI/ESRD. He has had one OP HD treatment 11/25/16 which he tolerated poorly.   He presented to ED 11/17/2016 with C/O ongoing diarrhea, abdominal pain and distention, fever,cough, SOB. T-99.1 BP 79/47 HR 80s RR 24. WBC 42.7 Lactic acid 4.8  HGB 10.6. CXR showed vascular congestion with increased patchy bibasilar opacities,likely atelectasis. CT of pelvis showed Mild small bowel distention without definitive transition zone. A few of the loops in the distal ileum demonstrate findings suggestive of pneumatosis which could represent early distal SB ischemia. He has been admitted for sepsis-probable GI sourse. C Diff antigen positive, toxin negative. He had been admitted to medical ICU, started on vanc/cefepime per primary. Blood cultures pending.   Currently, he is resting comfortably with family at bedside. He is on O2 at 3L/M currently says he is "a little short of breath". Somewhat breathless when he talks. He says he is +Fever + chills +SOB + weakness, + abdominal pain + diarrhea.   Past Medical History:  Diagnosis Date  . Anemia   . Asthma   . Atrial fibrillation ([place]) 12/08/2013  . CHF (congestive heart failure) ([place])   . Chronic joint pain    "from RA" (10/01/2016)  . CKD (chronic kidney disease), stage III ([place])   . Constipation    takes stool softener daily  . COPD (chronic obstructive pulmonary disease) ([place])   . Coronary artery disease   . Coronary artery disease involving native coronary artery of native  heart with angina pectoris ([place]) 09/30/2016  . Dysrhythmia    a-fib  . GERD (gastroesophageal reflux disease)    takes Zantac daily   . History of blood transfusion    "related to one of my ORs"  . Hyperlipidemia    takes Pravastatin daily  . Hypertension    takes Cardizem,Proscar,and Lisinopril daily  . Hypothyroidism   . Impaired hearing    wears hearing aids  . Joint swelling    "from RA" (10/01/2016)  . Peripheral neuropathy   . Pneumonia 2017  . Rheumatoid arthritis ([place])    "all over; take orencia  q 4 wks" (10/01/2016)  . S/P CABG x 2 10/09/2016   LIMA to LAD, SVG to OM, EVH via right thigh  . S/P Maze operation for atrial fibrillation 10/09/2016   Complete bilateral atrial lesion set using bipolar radiofrequency and cryothermy ablation with clipping of LA appendage  . Type II diabetes mellitus ([place])    Past Surgical History:  Procedure Laterality Date  . AMPUTATION TOE Left 11/08/2015   Procedure: LEFT HALLUX AMPUTATION AT THE METATARSOPHALANGEAL JOINT;  Surgeon: Wylene Simmer, MD;  Location: [place];  Service: Orthopedics;  Laterality: Left;  . ANKLE FUSION Left 2014  . AV FISTULA PLACEMENT Right 10/28/2016   Procedure: ARTERIOVENOUS  FISTULA CREATION right arm;  Surgeon: Angelia Mould, MD;  Location: [place];  Service: Vascular;  Laterality: Right;  . BIOPSY THYROID  2014  . CARDIAC CATHETERIZATION  09/30/2016  . CARPAL TUNNEL RELEASE Bilateral 1990's  . CATARACT EXTRACTION W/ INTRAOCULAR LENS  IMPLANT, BILATERAL Bilateral 1990's  . CLIPPING OF ATRIAL APPENDAGE  10/09/2016   Procedure: CLIPPING OF ATRIAL APPENDAGE;  Surgeon: Rexene Alberts, MD;  Location: [place];  Service: Open Heart Surgery;;  . COLONOSCOPY    . CORONARY ARTERY BYPASS GRAFT N/A 10/09/2016   Procedure: CORONARY ARTERY BYPASS GRAFTING (CABG) x two, using left internal mammary artery and right leg greater saphenous vein harvested endoscopically;  Surgeon: Rexene Alberts, MD;   Location: [place];  Service: Open Heart Surgery;  Laterality: N/A;  . FOREIGN BODY REMOVAL Right 09/06/2013   Procedure: REMOVAL FOREIGN BODY RIGHT INDEX FINGER;  Surgeon: Daryll Brod, MD;  Location: [place];  Service: Orthopedics;  Laterality: Right;  ANESTHESIA: IV REGIONAL FAB  . INSERTION OF DIALYSIS CATHETER N/A 10/28/2016   Procedure: INSERTION OF DIALYSIS CATHETER;  Surgeon: Angelia Mould, MD;  Location: [facility];  Service: Vascular;  Laterality: N/A;  . JOINT REPLACEMENT    . MAZE N/A 10/09/2016   Procedure: MAZE;  Surgeon: Rexene Alberts, MD;  Location: [place];  Service: Open Heart Surgery;  Laterality: N/A;  . REPAIR TENDONS FOOT Left 1946; 1984   "farming accident; repaired tendons both times"  . RIGHT/LEFT HEART CATH AND CORONARY ANGIOGRAPHY N/A 09/30/2016   Procedure: RIGHT/LEFT HEART CATH AND CORONARY ANGIOGRAPHY;  Surgeon: Nigel Mormon, MD;  Location: [facility];  Service: Cardiovascular;  Laterality: N/A;  . SHOULDER ARTHROSCOPY W/ ROTATOR CUFF REPAIR Right 1990's  . TEE WITHOUT CARDIOVERSION N/A 10/09/2016   Procedure: TRANSESOPHAGEAL ECHOCARDIOGRAM (TEE);  Surgeon: Rexene Alberts, MD;  Location: [place];  Service: Open Heart Surgery;  Laterality: N/A;  . THYROIDECTOMY Right 02/01/2013   Procedure: RIGHT THYROIDECTOMY LOBECTOMY;  Surgeon: Earnstine Regal, MD;  Location: [place];  Service: General;  Laterality: Right;  . TONSILLECTOMY  1930's  . TOTAL KNEE ARTHROPLASTY Right 12/16/2010   Procedure: TOTAL KNEE ARTHROPLASTY; rt Surgeon: Rudean Haskell, MD;  Location: [place];  Service: Orthopedics;  Laterality: Right;  . TOTAL SHOULDER REPLACEMENT Left 03/2010   Family History  Problem Relation Age of Onset  . Cancer Mother        breast  . Aneurysm Father    Social History:  reports that he quit smoking about 51 years ago. His smoking use included cigarettes. He has a 15.00 pack-year smoking history. he has never used smokeless tobacco. He reports  that he does not drink alcohol or use drugs. No Known Allergies Prior to Admission medications   Medication Sig Start Date End Date Taking? Authorizing Provider  abatacept Maureen Chatters) 250 MG injection Infusion due again October 2018 but with recent heart surgery, would wait until at least November 11/03/16  Yes Nani Skillern, PA-C  acetaminophen (TYLENOL) 325 MG tablet Take 2 tablets (650 mg total) by mouth every 6 (six) hours as needed for mild pain. 11/03/16  Yes Lars Pinks M, PA-C  amiodarone (PACERONE) 200 MG tablet Take 1 tablet (200 mg total) daily by mouth. 11/21/16  Yes Angiulli, Lavon Paganini, PA-C  aspirin 81 MG chewable tablet Chew 1 tablet (81 mg total) by mouth daily. 10/01/16  Yes Adrian Prows, MD  atorvastatin (LIPITOR) 40 MG tablet Take 1 tablet (40 mg total) daily at 6 PM by mouth. 11/21/16  Yes Angiulli, Lavon Paganini, PA-C  camphor-menthol [facility]) lotion Apply topically as needed for itching. 11/03/16  Yes Lars Pinks M, PA-C  diclofenac sodium (VOLTAREN) 1 % GEL Apply 2 g 4 (four) times daily topically. 11/21/16  Yes Angiulli, Lavon Paganini, PA-C  fluticasone furoate-vilanterol (  BREO ELLIPTA) 100-25 MCG/INH AEPB Inhale 1 puff daily into the lungs. 11/21/16  Yes Angiulli, Lavon Paganini, PA-C  gabapentin (NEURONTIN) 300 MG capsule Take 1 capsule (300 mg total) 2 (two) times daily by mouth. 11/21/16  Yes Angiulli, Lavon Paganini, PA-C  guaiFENesin (ROBITUSSIN) 100 MG/5ML SOLN Take 20 mLs (400 mg total) every 4 (four) hours as needed by mouth for cough or to loosen phlegm. 11/21/16  Yes Angiulli, Lavon Paganini, PA-C  Hydrocortisone (GERHARDT'S BUTT CREAM) CREA Apply 1 application 2 (two) times daily topically. 11/21/16  Yes Angiulli, Lavon Paganini, PA-C  insulin detemir (LEVEMIR) 100 unit/ml SOLN Inject 0.15 mLs (15 Units total) at bedtime into the skin. 11/21/16  Yes Angiulli, Lavon Paganini, PA-C  levothyroxine (SYNTHROID, LEVOTHROID) 25 MCG tablet Take 1 tablet (25 mcg total) daily before breakfast by mouth.  11/21/16  Yes Angiulli, Lavon Paganini, PA-C  Maltodextrin-Xanthan Gum (RESOURCE THICKENUP CLEAR) POWD Take 120 g as needed by mouth (thicken liquids to nectar consistency). 11/21/16  Yes Angiulli, Lavon Paganini, PA-C  metoprolol tartrate (LOPRESSOR) 25 MG tablet Take 0.5 tablets (12.5 mg total) 2 (two) times daily by mouth. 11/21/16  Yes Angiulli, Lavon Paganini, PA-C  midodrine (PROAMATINE) 10 MG tablet Take 2 tablets (20 mg total) 3 (three) times daily with meals by mouth. 11/21/16  Yes Angiulli, Lavon Paganini, PA-C  multivitamin (RENA-VIT) TABS tablet Take 1 tablet at bedtime by mouth. 11/21/16  Yes Angiulli, Lavon Paganini, PA-C  predniSONE (DELTASONE) 5 MG tablet Take 1 tablet (5 mg total) daily with breakfast by mouth. 11/21/16  Yes Angiulli, Lavon Paganini, PA-C  pregabalin (LYRICA) 25 MG capsule Take 1 capsule (25 mg total) daily by mouth. 11/21/16  Yes Angiulli, Lavon Paganini, PA-C  ranitidine (ZANTAC) 150 MG tablet Take 1 tablet (150 mg total) daily by mouth. 11/21/16  Yes Angiulli, Lavon Paganini, PA-C  warfarin (COUMADIN) 2.5 MG tablet Take 1 tablet (2.5 mg total) daily at 6 PM by mouth. 11/21/16  Yes Angiulli, Lavon Paganini, PA-C  feeding supplement, GLUCERNA SHAKE, (GLUCERNA SHAKE) LIQD Take 237 mLs by mouth 2 (two) times daily between meals. 11/03/16   Nani Skillern, PA-C  finasteride (PROSCAR) 5 MG tablet Take 1 tablet (5 mg total) at bedtime by mouth. Patient not taking: Reported on 12/11/2016 11/21/16   Angiulli, Lavon Paganini, PA-C  lidocaine (LIDODERM) 5 % Place 1 patch daily onto the skin. Remove & Discard patch within 12 hours or as directed by MD 11/21/16   Angiulli, Lavon Paganini, PA-C  tamsulosin (FLOMAX) 0.4 MG CAPS capsule Take 1 capsule (0.4 mg total) at bedtime by mouth. 11/21/16   Angiulli, Lavon Paganini, PA-C  traMADol (ULTRAM) 50 MG tablet Take 1 tablet (50 mg total) every 12 (twelve) hours as needed by mouth for severe pain. 11/21/16   Angiulli, Lavon Paganini, PA-C   Current Facility-Administered Medications  Medication Dose Route  Frequency Provider Last Rate Last Dose  . 0.9 %  sodium chloride infusion  250 mL Intravenous PRN Desai, Rahul P, PA-C      . arformoterol (BROVANA) nebulizer solution 15 mcg  15 mcg Nebulization BID Desai, Rahul P, PA-C   15 mcg at 11/27/16 1133  . budesonide (PULMICORT) nebulizer solution 0.5 mg  0.5 mg Nebulization BID Shearon Stalls, Rahul P, PA-C   0.5 mg at 11/27/16 1135  . ceFEPIme (MAXIPIME) 1 g in dextrose 5 % 50 mL IVPB  1 g Intravenous Q24H Rumbarger, Valeda Malm, RPH      . chlorhexidine (PERIDEX) 0.12 % solution 15 mL  15 mL Mouth Rinse BID Chesley Mires, MD   15 mL at 11/27/16 1000  . hydrocortisone sodium succinate (SOLU-CORTEF) 100 MG injection 50 mg  50 mg Intravenous Q6H Desai, Rahul P, PA-C   50 mg at 11/27/16 0900  . MEDLINE mouth rinse  15 mL Mouth Rinse q12n4p Chesley Mires, MD   15 mL at 11/27/16 1129   Labs: Basic Metabolic Panel: Recent Labs  Lab 11/21/16 0738 11/30/2016 1455 11/27/16 0320  NA 134* 136 139  K 4.5 5.2* 4.6  CL 96* 94* 103  CO2 28 26 22   GLUCOSE 88 153* 119*  BUN 18 38* 46*  CREATININE 3.70* 4.81* 4.79*  CALCIUM 8.1* 8.5* 8.0*  PHOS 2.8  --  4.8*   Liver Function Tests: Recent Labs  Lab 11/21/16 0738 11/13/2016 1455  AST  --  31  ALT  --  24  ALKPHOS  --  97  BILITOT  --  1.6*  PROT  --  6.0*  ALBUMIN 2.2* 2.3*   No results for input(s): LIPASE, AMYLASE in the last 168 hours. No results for input(s): AMMONIA in the last 168 hours. CBC: Recent Labs  Lab 11/21/16 0738 12/09/2016 1455 11/27/16 0320  WBC 11.7* 42.7* 29.3*  NEUTROABS  --  36.3*  --   HGB 8.6* 10.6* 10.0*  HCT 27.7* 34.1* 31.9*  MCV 93.6 97.7 96.7  PLT 171 275 228   Cardiac Enzymes: Recent Labs  Lab 11/21/2016 2306  TROPONINI 0.21*   CBG: Recent Labs  Lab 11/21/16 0807 11/21/16 1003 11/21/16 1202 11/27/16 0114 11/27/16 1107  GLUCAP 97 95 107* 123* 146*   Iron Studies: No results for input(s): IRON, TIBC, TRANSFERRIN, FERRITIN in the last 72 hours. Studies/Results: Ct  Abdomen Pelvis Wo Contrast  Result Date: 12/11/2016 CLINICAL DATA:  Generalized weakness with nausea EXAM: CT ABDOMEN AND PELVIS WITHOUT CONTRAST TECHNIQUE: Multidetector CT imaging of the abdomen and pelvis was performed following the standard protocol without IV contrast. COMPARISON:  10/16/2016 FINDINGS: Lower chest: Moderate left pleural effusion is noted. Mild left lower lobe atelectatic changes are seen. No other focal infiltrate is noted. Hepatobiliary: No focal liver abnormality is seen. No gallstones, gallbladder wall thickening, or biliary dilatation. Pancreas: Unremarkable. No pancreatic ductal dilatation or surrounding inflammatory changes. Spleen: Normal in size without focal abnormality. Adrenals/Urinary Tract: The adrenal glands are within normal limits. The kidneys demonstrates some cystic change stable from the previous exam. No renal calculi or obstructive changes are noted. The bladder is decompressed. Stomach/Bowel: Colon is well distended with both fluid and fecal material. No obstructive changes are seen. The appendix is not well visualized although no inflammatory changes to suggest appendicitis are seen. Mildly prominent loops of small bowel are noted in the mid abdomen containing fluid which may represent some mild enteritis. No definitive transition zone is identified. The distal aspect of the ileum however there are a few loops which have changes suggestive of pneumatosis. No portal venous gas is noted. Very mild inflammatory changes are noted surrounding the distal ileum. Vascular/Lymphatic: Aortic atherosclerosis. No enlarged abdominal or pelvic lymph nodes. Reproductive: Prostate is unremarkable. Other: No abdominal wall hernia or abnormality. No abdominopelvic ascites. Musculoskeletal: Degenerative changes of lumbar spine are noted. IMPRESSION: Mild small bowel distention without definitive transition zone. A few of the loops in the distal ileum demonstrate findings suggestive of  pneumatosis. This could represent some very early distal small bowel ischemia. No portal venous air is noted. Correlation to the physical exam is recommended. Moderate left  pleural effusion with associated atelectatic changes. No other focal abnormality is seen. Critical Value/emergent results were called by telephone at the time of interpretation on 11/19/2016 at 5:05 pm to Dr. Merrily Pew , who verbally acknowledged these results. Electronically Signed   By: Inez Catalina M.D.   On: 11/30/2016 17:06   Dg Chest 2 View  Result Date: 12/02/2016 CLINICAL DATA:  Increasing weakness, diarrhea and vomiting since last night. Began hemodialysis approximately 6 weeks ago. EXAM: CHEST  2 VIEW COMPARISON:  Radiographs 11/13/2016 and 11/07/2016. FINDINGS: Right IJ dialysis catheters appear unchanged at the level of the superior cavoatrial junction. There is stable cardiomegaly status post CABG and left atrial appendage clipping. There are lower lung volumes with increased patchy opacities at both lung bases, likely atelectasis. Vascular congestion is present without overt pulmonary edema, confluent airspace opacity or significant pleural effusion. IMPRESSION: Vascular congestion with increased patchy bibasilar opacities, likely atelectasis. No definite edema. Electronically Signed   By: Richardean Sale M.D.   On: 11/18/2016 16:52   Dg Chest Port 1 View  Result Date: 11/27/2016 CLINICAL DATA:  Respiratory failure EXAM: PORTABLE CHEST 1 VIEW COMPARISON:  11/19/2016 FINDINGS: Mild enlargement of the cardiopericardial silhouette noted with obscuration of the left lung base and hazy opacity in the left mid lung favoring layering effusion. Prior CABG and left atrial appendage clamp. Right IJ dialysis catheter tip: Right atrium. Atherosclerotic calcification of the aortic arch. Left proximal humeral implant. Subsegmental atelectasis at the right lung base. Mildly indistinct pulmonary vasculature. IMPRESSION: 1. Worsening  airspace opacities left lung base likely representing combination of pleural effusion and atelectasis. There is subsegmental atelectasis at the right lung base. 2. Cardiomegaly with pulmonary venous hypertension. 3. Dialysis catheter tip: Right atrium. Electronically Signed   By: Van Clines M.D.   On: 11/27/2016 07:13    ROS: As per HPI otherwise negative.  Physical Exam: Vitals:   11/27/16 1200 11/27/16 1230 11/27/16 1300 11/27/16 1330  BP: (!) 111/56 (!) 108/50 (!) 110/58 115/62  Pulse: 79 69 79 71  Resp: 19 (!) 22 (!) 28 (!) 21  Temp:      TempSrc:      SpO2: 100% 100% 100% 100%  Weight:      Height:         General: Ill appearing caucasian male in NAD Head: Normocephalic, atraumatic, sclera non-icteric, mucus membranes are moist Neck: Supple. JVD with mild elevation. CV: S1,S2, regularly irreg. No M/G/R Lungs: Bilateral breath sounds slightly decreased in bases with few scattered rhonchi upper airway, few bibasilar crackles. No WOB present.  Abdomen: distended with active BS.  M-S:  Strength and tone appear normal for age. Lower extremities:Trace BLE edema.  Neuro: Alert and oriented X 3. Moves all extremities spontaneously. Psych:  Responds to questions appropriately with a normal affect. Dialysis Access: Maturing RUA AVF + bruit RIJ TDC drsg CDI.   Dialysis Orders: [place] T,Th,S 4 hrs 180NRe 400/800 90 kg 2.0 K/ 2.25 Ca UFP 4 linear sodium Heparin 2000 units IV TIW  Assessment/Plan: 1.  Sepsis: Unclear etiology. Probable colitis/possible PNA. On Vanc/cefepime per primary.  2.  Acute hypoxic respiratory failure. O2 sats stable on O2 at 3L/M. Per primary 3.  Volume overload: Vascular congestion without overt pulmonary edema on CXR 12/02/2016. Was unable to have any volume removed during HD 11/25/16 D/T hypotension. Attempt to lower volume on HD today.  4.  ESRD -  T,Th,S. HD today as separate. K+ 4.7 5.  Hypertension/volume  - BP on  soft side. No pressors yet. Gentle UF  2 liters.  6.  Anemia  - HGB 10.0 No ESA needed yet. Follow HGB. Fe 32 Tsat 18 11/25/16 however will hold off Fe load D/T sepsis.  7.  Metabolic bone disease - Add renal function panel to today's labs. No binders/VDRA yet.  8.  Nutrition -NPO except ice chips. Albumin 2.3 9.  Afib: continue coumadin per primary/pharmacy. 10. GOC: Patient is DNR. If patient starts to decline, primary will transition to comfort care.   Rita H. Owens Shark, NP-C 11/27/2016, 1:44 PM  [employer] [phone]  Pt seen, examined and agree w A/P as above. ESRD pt , recent start after complications from his CABG/ Maze procedure.  Has had only 1 OP dialysis and is back in the hospital.  Admitted for abd pain, SOB, cough, fever. CXR shows vasc congestion, exam shows some possible vol excess (rales, JVD, no edema).  Plan gentle UF with bedside HD today/ tonight as pt will tolerate. Not optimistic however, he  looks very frail.  If decompensates primary team will transition to comfort care.  Not CRRT candidate.  Kelly Splinter MD [employer] pager [phone]   11/27/2016, 2:39 PM

## 2016-11-27 NOTE — Progress Notes (Signed)
PULMONARY / CRITICAL CARE MEDICINE H&P   Name: [name] MRN: [id number] DOB: 11-Dec-1930    ADMISSION DATE:  11/18/2016 CONSULTATION DATE:  11/23/2016  REFERRING MD:  Dr. Dayna Barker  CHIEF COMPLAINT:  SIRS  BRIEF SUMMARY:   83 year old male who presented to ER 11/14 with ongoing diarrhea, worsening shortness of breath, cough, fever of 103, abdominal distention and discomfort.  Dialysis yesterday was not fully completed due to hypotension.  Patient is on midodrine for chronic hypotension. Found to have concern for sepis (?abd vs pulm source) admitted for work up.  CT abd concerning for possible early distal small bowel ischemia.  C-Diff Ag + / Toxin negative.  CXR concerning for bibasilar atelectasis / effusion and patchy atelectasis.   Of note, patient had hospitalization in September for SOB, found to have multivessel CAD s/p CABG 1/70/01 complicated by renal failure and pneumothorax.  Patient discharged to rehab over the last month but discharged home on Friday. Now ESRD on HD     SUBJECTIVE:  Daughter at bedside.  Pt reports he feels better.  Continues to have cough.  Diarrhea has slowed, mild intermittent lower abdominal pain. Pt asking for something to drink.  VITAL SIGNS: BP (!) 117/57   Pulse 81   Temp 97.6 F (36.4 C) (Oral)   Resp 15   Ht 5\' 8"  (1.727 m)   Wt 194 lb 3.6 oz (88.1 kg)   SpO2 100%   BMI 29.53 kg/m   HEMODYNAMICS:    VENTILATOR SETTINGS:    INTAKE / OUTPUT: I/O last 3 completed shifts: In: 3250 [IV Piggyback:3250] Out: -   PHYSICAL EXAMINATION: General: elderly male in NAD lying in bed HEENT: MM pink/dry, no jvd, R IJ HD permcath  Neuro: AAOx4, speech clear, MAE, generalized weakness  CV: s1s2 rrr, no m/r/g PULM: even/non-labored, lungs bilaterally coarse rhonchi  GI: soft, non-tender, bsx4 active, no peritoneal signs   Extremities: warm/dry, trace generalized edema  Skin: no rashes or lesions  LABS:  BMET Recent Labs  Lab 11/21/16 0738  11/23/2016 1455 11/27/16 0320  NA 134* 136 139  K 4.5 5.2* 4.6  CL 96* 94* 103  CO2 28 26 22   BUN 18 38* 46*  CREATININE 3.70* 4.81* 4.79*  GLUCOSE 88 153* 119*    Electrolytes Recent Labs  Lab 11/21/16 0738 11/17/2016 1455 11/27/16 0320  CALCIUM 8.1* 8.5* 8.0*  MG  --   --  1.8  PHOS 2.8  --  4.8*    CBC Recent Labs  Lab 11/21/16 0738 11/17/2016 1455 11/27/16 0320  WBC 11.7* 42.7* 29.3*  HGB 8.6* 10.6* 10.0*  HCT 27.7* 34.1* 31.9*  PLT 171 275 228    Coag's Recent Labs  Lab 11/21/16 0457 11/25/2016 1455 11/27/16 0320  INR 2.84 2.43 2.61    Sepsis Markers Recent Labs  Lab 11/24/2016 1540 11/13/2016 1755 11/29/2016 2306 11/27/16 0320  LATICACIDVEN 4.80* 2.27* 1.9  --   PROCALCITON  --   --  11.22 9.19    ABG No results for input(s): PHART, PCO2ART, PO2ART in the last 168 hours.  Liver Enzymes Recent Labs  Lab 11/21/16 0738 11/25/2016 1455  AST  --  31  ALT  --  24  ALKPHOS  --  97  BILITOT  --  1.6*  ALBUMIN 2.2* 2.3*    Cardiac Enzymes Recent Labs  Lab 12/04/2016 2306  TROPONINI 0.21*    Glucose Recent Labs  Lab 11/20/16 2328 11/21/16 0642 11/21/16 0807 11/21/16 1003 11/21/16  1202 11/27/16 0114  GLUCAP 135* 57* 97 95 107* 123*    Imaging Ct Abdomen Pelvis Wo Contrast  Result Date: 12/04/2016 CLINICAL DATA:  Generalized weakness with nausea EXAM: CT ABDOMEN AND PELVIS WITHOUT CONTRAST TECHNIQUE: Multidetector CT imaging of the abdomen and pelvis was performed following the standard protocol without IV contrast. COMPARISON:  10/16/2016 FINDINGS: Lower chest: Moderate left pleural effusion is noted. Mild left lower lobe atelectatic changes are seen. No other focal infiltrate is noted. Hepatobiliary: No focal liver abnormality is seen. No gallstones, gallbladder wall thickening, or biliary dilatation. Pancreas: Unremarkable. No pancreatic ductal dilatation or surrounding inflammatory changes. Spleen: Normal in size without focal abnormality.  Adrenals/Urinary Tract: The adrenal glands are within normal limits. The kidneys demonstrates some cystic change stable from the previous exam. No renal calculi or obstructive changes are noted. The bladder is decompressed. Stomach/Bowel: Colon is well distended with both fluid and fecal material. No obstructive changes are seen. The appendix is not well visualized although no inflammatory changes to suggest appendicitis are seen. Mildly prominent loops of small bowel are noted in the mid abdomen containing fluid which may represent some mild enteritis. No definitive transition zone is identified. The distal aspect of the ileum however there are a few loops which have changes suggestive of pneumatosis. No portal venous gas is noted. Very mild inflammatory changes are noted surrounding the distal ileum. Vascular/Lymphatic: Aortic atherosclerosis. No enlarged abdominal or pelvic lymph nodes. Reproductive: Prostate is unremarkable. Other: No abdominal wall hernia or abnormality. No abdominopelvic ascites. Musculoskeletal: Degenerative changes of lumbar spine are noted. IMPRESSION: Mild small bowel distention without definitive transition zone. A few of the loops in the distal ileum demonstrate findings suggestive of pneumatosis. This could represent some very early distal small bowel ischemia. No portal venous air is noted. Correlation to the physical exam is recommended. Moderate left pleural effusion with associated atelectatic changes. No other focal abnormality is seen. Critical Value/emergent results were called by telephone at the time of interpretation on 12/02/2016 at 5:05 pm to Dr. Merrily Pew , who verbally acknowledged these results. Electronically Signed   By: Inez Catalina M.D.   On: 11/25/2016 17:06   Dg Chest 2 View  Result Date: 12/08/2016 CLINICAL DATA:  Increasing weakness, diarrhea and vomiting since last night. Began hemodialysis approximately 6 weeks ago. EXAM: CHEST  2 VIEW COMPARISON:   Radiographs 11/13/2016 and 11/07/2016. FINDINGS: Right IJ dialysis catheters appear unchanged at the level of the superior cavoatrial junction. There is stable cardiomegaly status post CABG and left atrial appendage clipping. There are lower lung volumes with increased patchy opacities at both lung bases, likely atelectasis. Vascular congestion is present without overt pulmonary edema, confluent airspace opacity or significant pleural effusion. IMPRESSION: Vascular congestion with increased patchy bibasilar opacities, likely atelectasis. No definite edema. Electronically Signed   By: Richardean Sale M.D.   On: 11/25/2016 16:52   Dg Chest Port 1 View  Result Date: 11/27/2016 CLINICAL DATA:  Respiratory failure EXAM: PORTABLE CHEST 1 VIEW COMPARISON:  11/19/2016 FINDINGS: Mild enlargement of the cardiopericardial silhouette noted with obscuration of the left lung base and hazy opacity in the left mid lung favoring layering effusion. Prior CABG and left atrial appendage clamp. Right IJ dialysis catheter tip: Right atrium. Atherosclerotic calcification of the aortic arch. Left proximal humeral implant. Subsegmental atelectasis at the right lung base. Mildly indistinct pulmonary vasculature. IMPRESSION: 1. Worsening airspace opacities left lung base likely representing combination of pleural effusion and atelectasis. There is subsegmental atelectasis at  the right lung base. 2. Cardiomegaly with pulmonary venous hypertension. 3. Dialysis catheter tip: Right atrium. Electronically Signed   By: Van Clines M.D.   On: 11/27/2016 07:13     STUDIES:  CT abd/pelvis 11/14 > Mild small bowel distention without definitive transition zone. A few of the loops in the distal ileum demonstrate findings suggestive of pneumatosis. This could represent some very early distal small bowel ischemia. No portal venous air is noted. Correlation to the physical exam is recommended. Moderate left pleural effusion with associated  atelectatic changes. No other focal abnormality is seen.  CULTURES: Blood 11/14 >> Urine 11/14 >> GI panel PCR 11/14 >> Cdif 11/14 > toxin neg, antigen pos  ANTIBIOTICS: Cefepime 11/14 >> Vancomycin 11/14 >>  SIGNIFICANT EVENTS: 09/2016 CABG, ICU stay 1 month, 2 weeks rehab dc home 11/9 11/13  half HD due to hypotension 11/14  Admit    LINES/TUBES:   DISCUSSION: 83 M with recent CABG and prolonged ICU stay now ESRD. Admitted for severe sepsis 11/14 with GI likely source.   ASSESSMENT / PLAN:  PULMONARY A: Acute hypoxemic respiratory failure. Likely related to volume overload, ddx includes HCAP and effusion.  L effusion COPD without acute exacerbation Suspect Element Aspiration  P:   O2 as needed to support sats > 90% Monitor pleural effusions on CXR > suspect related to volume / ESRD Continue Brovana + Pulmicort  Hold home Breo Flutter valve  Will need SLP consult   CARDIOVASCULAR A:  Severe sepsis CAD s/p CABG 09/2016 Afib s/p MAZE 09/2016. Currently AF with controlled vent response.  P:  ICU monitoring  DNR / DNI in the event of decline. Hold home metoprolol, amiodarone, ASA Continue stress dose steroids, 50 mg IV Q6  RENAL A:   ESRD  Hyperkalemia P:   Trend BMP / urinary output Replace electrolytes as indicated Nephrology consulted, appreciate assistance   GASTROINTESTINAL A:   Likely Colitis C. Diff antigen pos toxin neg Nausea/vomiting/diarrhea Pneumatosis on CT P:   NPO x ice chips Poor surgical candidate per General Surgery  ABX as below SLP eval when ok to take PO  HEMATOLOGIC A:   Warfarin coagulopathy P:  Heparin gtt per pharmacy  Hold home coumadin   INFECTIOUS A:   Sepsis - etiology not entirely clear. Possibly colitis based on symptoms and pneumatosis, however, CXR does not rule out PNA. Cdiff toxin negative.  P:   Continue Vancomycin / Cefepime  Follow cultures Await GI panel, RVP Discontinue oral vancomycin    ENDOCRINE A:   Hypothyroid DM RA on prednisone, orencia P:   Continue stress dose steroids  Continue synthroid  Orencia on hold since September   NEUROLOGIC A:   Acute metabolic encephalopathy - resolved P:   Supportive care PT consult   FAMILY  - Updates: Family updated at bedside.  Discussed plan of care going forward > will push for full medical care, rehab if possible but if he declines family would want DNR/DNI and transition to comfort.  Thus far he has stabilized.  Will continue supportive care.     - Inter-disciplinary family meet or Palliative Care meeting due by:  11/22   Noe Gens, NP-C [facility] Pgr: [phone] or if no answer [phone] 11/27/2016, 9:26 AM

## 2016-11-27 NOTE — Progress Notes (Signed)
Pt C. Diff antigen positive, toxin negative, and PCR negative.  Pt taken off enteric precautions and pt and family educated.  Will continue to monitor pt.

## 2016-11-27 NOTE — Progress Notes (Signed)
Subjective/Chief Complaint: Less abdominal pain this morning No nausea BM yesterday Increased work of breathing/ shortness of breath   Objective: Vital signs in last 24 hours: Temp:  [97.6 F (36.4 C)-99.1 F (37.3 C)] 97.6 F (36.4 C) (11/15 0900) Pulse Rate:  [43-114] 81 (11/15 0900) Resp:  [11-34] 15 (11/15 0900) BP: (79-149)/(41-99) 117/57 (11/15 0900) SpO2:  [88 %-100 %] 100 % (11/15 0900) Weight:  [82.9 kg (182 lb 12.2 oz)-90 kg (198 lb 6.6 oz)] 88.1 kg (194 lb 3.6 oz) (11/15 0500) Last BM Date: 11/21/2016  Intake/Output from previous day: 11/14 0701 - 11/15 0700 In: 3250 [IV Piggyback:3250] Out: -  Intake/Output this shift: No intake/output data recorded.  General appearance: alert, cooperative and no distress GI: soft, minimal distention; hypoactive bowel sounds; mild Right side tenderness; no peritonitis  Lab Results:  Recent Labs    12/07/2016 1455 11/27/16 0320  WBC 42.7* 29.3*  HGB 10.6* 10.0*  HCT 34.1* 31.9*  PLT 275 228   BMET Recent Labs    12/02/2016 1455 11/27/16 0320  NA 136 139  K 5.2* 4.6  CL 94* 103  CO2 26 22  GLUCOSE 153* 119*  BUN 38* 46*  CREATININE 4.81* 4.79*  CALCIUM 8.5* 8.0*   PT/INR Recent Labs    12/07/2016 1455 11/27/16 0320  LABPROT 26.2* 27.7*  INR 2.43 2.61   ABG No results for input(s): PHART, HCO3 in the last 72 hours.  Invalid input(s): PCO2, PO2  Studies/Results: Ct Abdomen Pelvis Wo Contrast  Result Date: 11/22/2016 CLINICAL DATA:  Generalized weakness with nausea EXAM: CT ABDOMEN AND PELVIS WITHOUT CONTRAST TECHNIQUE: Multidetector CT imaging of the abdomen and pelvis was performed following the standard protocol without IV contrast. COMPARISON:  10/16/2016 FINDINGS: Lower chest: Moderate left pleural effusion is noted. Mild left lower lobe atelectatic changes are seen. No other focal infiltrate is noted. Hepatobiliary: No focal liver abnormality is seen. No gallstones, gallbladder wall thickening, or  biliary dilatation. Pancreas: Unremarkable. No pancreatic ductal dilatation or surrounding inflammatory changes. Spleen: Normal in size without focal abnormality. Adrenals/Urinary Tract: The adrenal glands are within normal limits. The kidneys demonstrates some cystic change stable from the previous exam. No renal calculi or obstructive changes are noted. The bladder is decompressed. Stomach/Bowel: Colon is well distended with both fluid and fecal material. No obstructive changes are seen. The appendix is not well visualized although no inflammatory changes to suggest appendicitis are seen. Mildly prominent loops of small bowel are noted in the mid abdomen containing fluid which may represent some mild enteritis. No definitive transition zone is identified. The distal aspect of the ileum however there are a few loops which have changes suggestive of pneumatosis. No portal venous gas is noted. Very mild inflammatory changes are noted surrounding the distal ileum. Vascular/Lymphatic: Aortic atherosclerosis. No enlarged abdominal or pelvic lymph nodes. Reproductive: Prostate is unremarkable. Other: No abdominal wall hernia or abnormality. No abdominopelvic ascites. Musculoskeletal: Degenerative changes of lumbar spine are noted. IMPRESSION: Mild small bowel distention without definitive transition zone. A few of the loops in the distal ileum demonstrate findings suggestive of pneumatosis. This could represent some very early distal small bowel ischemia. No portal venous air is noted. Correlation to the physical exam is recommended. Moderate left pleural effusion with associated atelectatic changes. No other focal abnormality is seen. Critical Value/emergent results were called by telephone at the time of interpretation on 11/19/2016 at 5:05 pm to Dr. Merrily Pew , who verbally acknowledged these results. Electronically Signed  By: Inez Catalina M.D.   On: 12/03/2016 17:06   Dg Chest 2 View  Result Date:  11/25/2016 CLINICAL DATA:  Increasing weakness, diarrhea and vomiting since last night. Began hemodialysis approximately 6 weeks ago. EXAM: CHEST  2 VIEW COMPARISON:  Radiographs 11/13/2016 and 11/07/2016. FINDINGS: Right IJ dialysis catheters appear unchanged at the level of the superior cavoatrial junction. There is stable cardiomegaly status post CABG and left atrial appendage clipping. There are lower lung volumes with increased patchy opacities at both lung bases, likely atelectasis. Vascular congestion is present without overt pulmonary edema, confluent airspace opacity or significant pleural effusion. IMPRESSION: Vascular congestion with increased patchy bibasilar opacities, likely atelectasis. No definite edema. Electronically Signed   By: Richardean Sale M.D.   On: 11/29/2016 16:52   Dg Chest Port 1 View  Result Date: 11/27/2016 CLINICAL DATA:  Respiratory failure EXAM: PORTABLE CHEST 1 VIEW COMPARISON:  11/14/2016 FINDINGS: Mild enlargement of the cardiopericardial silhouette noted with obscuration of the left lung base and hazy opacity in the left mid lung favoring layering effusion. Prior CABG and left atrial appendage clamp. Right IJ dialysis catheter tip: Right atrium. Atherosclerotic calcification of the aortic arch. Left proximal humeral implant. Subsegmental atelectasis at the right lung base. Mildly indistinct pulmonary vasculature. IMPRESSION: 1. Worsening airspace opacities left lung base likely representing combination of pleural effusion and atelectasis. There is subsegmental atelectasis at the right lung base. 2. Cardiomegaly with pulmonary venous hypertension. 3. Dialysis catheter tip: Right atrium. Electronically Signed   By: Van Clines M.D.   On: 11/27/2016 07:13    Anti-infectives: Anti-infectives (From admission, onward)   Start     Dose/Rate Route Frequency Ordered Stop   11/27/16 1800  ceFEPIme (MAXIPIME) 1 g in dextrose 5 % 50 mL IVPB     1 g 100 mL/hr over 30  Minutes Intravenous Every 24 hours 12/03/2016 1745     11/18/2016 1700  vancomycin (VANCOCIN) 50 mg/mL oral solution 125 mg     125 mg Oral Every 6 hours 11/29/2016 1638     12/08/2016 1530  vancomycin (VANCOCIN) IVPB 1000 mg/200 mL premix  Status:  Discontinued     1,000 mg 200 mL/hr over 60 Minutes Intravenous  Once 12/02/2016 1520 11/14/2016 1521   12/08/2016 1530  ceFEPIme (MAXIPIME) 2 g in dextrose 5 % 50 mL IVPB     2 g 100 mL/hr over 30 Minutes Intravenous  Once 12/02/2016 1520 11/28/2016 1702   11/21/2016 1530  vancomycin (VANCOCIN) 2,000 mg in sodium chloride 0.9 % 500 mL IVPB     2,000 mg 250 mL/hr over 120 Minutes Intravenous  Once 11/21/2016 1521 12/12/2016 1829      Assessment/Plan: Small bowel pneumatosis - ischemia secondary to low flow - improved with hydration and abx, but may be causing more pulmonary issues.  C. Diff positive antigen, but negative toxin.  WBC improving.    Would continue bowel rest - may have ice chips.  Would be a very poor surgical candidate if his condition worsens and he develops a surgical indication.   LOS: 1 day    [name] K. 11/27/2016

## 2016-11-27 NOTE — Progress Notes (Signed)
Called [facility], informed nurse Gretchen and md pt npo has order for po vancomycin. Pt on enteric precautions and c-diff negative.

## 2016-11-27 NOTE — Progress Notes (Addendum)
Pharmacy Antibiotic Note  [name] is a 83 y.o. male with a PMH of asthma, Afib, CHF, ESRD, COPD, CAG (CABG), HTN, HLD, hypothyroidism, neuropathy, RA, and T2DM. Patient was admitted on 11/24/2016 with hypotension and diarrhea, suspected sepsis.  Pharmacy has been consulted for Vancomycin and cefepime dosing.  Assessment: Patient has ESRD on TTS HD PTA.  Plan to resume schedule today per Renal.  Patient's CXR shows worsening airspace opacities, either due to pleural effusion or atelectasis. Lactic acid, pro-calcitonin, WBC are improving. Patient has been afebrile.   Plan: Initiate Vancomycin 1000mg  IV q-HD TTS Continue cefepime 1gm IV Q24H Monitor WBC, Temperature, Lactic acid, Pro-calcitonin, Ability to tolerate HD sessions, Cultures, Vancomycin trough, and Toxicities  Height: 5\' 8"  (172.7 cm) Weight: 194 lb 3.6 oz (88.1 kg) IBW/kg (Calculated) : 68.4  Temp (24hrs), Avg:98.1 F (36.7 C), Min:97.6 F (36.4 C), Max:99.1 F (37.3 C)  Recent Labs  Lab 11/21/16 0738 11/30/2016 1455 11/18/2016 1540 12/07/2016 1755 11/17/2016 2306 11/27/16 0320  WBC 11.7* 42.7*  --   --   --  29.3*  CREATININE 3.70* 4.81*  --   --   --  4.79*  LATICACIDVEN  --   --  4.80* 2.27* 1.9  --     Estimated Creatinine Clearance: 12.4 mL/min (A) (by C-G formula based on SCr of 4.79 mg/dL (H)).    No Known Allergies  Antimicrobials this admission: 11/14 Vancomycin >>  11/14 Cefepime >>   Dose adjustments this admission: N/A  Microbiology results: 11/14 BCx: NGTD <24 hours 11/14 Expectorated Sputum: Pending 11/14 Respiratory panel: Pending 11/14 GI panel: Pending 11/14 Cdiff: Antigen positive, Toxin negative 11/14 Cdiff PCR: Negative 11/14 MRSA PCR: Invalid results, culture pending  Thank you for allowing pharmacy to be a part of this patient's care.  Geraldo Pitter  PharmD Candidate 11/27/2016 1:37 PM   Remigio Eisenmenger D. Mina Marble, PharmD, BCPS Pager:  [phone] 11/27/2016, 2:09 PM

## 2016-11-28 ENCOUNTER — Inpatient Hospital Stay (HOSPITAL_COMMUNITY): Payer: Medicare Other

## 2016-11-28 LAB — BASIC METABOLIC PANEL
ANION GAP: 16 — AB (ref 5–15)
BUN: 32 mg/dL — AB (ref 6–20)
CHLORIDE: 99 mmol/L — AB (ref 101–111)
CO2: 22 mmol/L (ref 22–32)
Calcium: 8 mg/dL — ABNORMAL LOW (ref 8.9–10.3)
Creatinine, Ser: 3.61 mg/dL — ABNORMAL HIGH (ref 0.61–1.24)
GFR calc Af Amer: 16 mL/min — ABNORMAL LOW (ref >60)
GFR calc non Af Amer: 14 mL/min — ABNORMAL LOW (ref >60)
GLUCOSE: 158 mg/dL — AB (ref 65–99)
POTASSIUM: 3.8 mmol/L (ref 3.5–5.1)
Sodium: 137 mmol/L (ref 135–145)

## 2016-11-28 LAB — CBC
HEMATOCRIT: 29.4 % — AB (ref 39.0–52.0)
HEMOGLOBIN: 9.1 g/dL — AB (ref 13.0–17.0)
MCH: 29.5 pg (ref 26.0–34.0)
MCHC: 31 g/dL (ref 30.0–36.0)
MCV: 95.5 fL (ref 78.0–100.0)
Platelets: 224 10*3/uL (ref 150–400)
RBC: 3.08 MIL/uL — ABNORMAL LOW (ref 4.22–5.81)
RDW: 22.5 % — AB (ref 11.5–15.5)
WBC: 17.3 10*3/uL — ABNORMAL HIGH (ref 4.0–10.5)

## 2016-11-28 LAB — GLUCOSE, CAPILLARY
GLUCOSE-CAPILLARY: 136 mg/dL — AB (ref 65–99)
GLUCOSE-CAPILLARY: 225 mg/dL — AB (ref 65–99)
Glucose-Capillary: 145 mg/dL — ABNORMAL HIGH (ref 65–99)
Glucose-Capillary: 157 mg/dL — ABNORMAL HIGH (ref 65–99)
Glucose-Capillary: 176 mg/dL — ABNORMAL HIGH (ref 65–99)
Glucose-Capillary: 245 mg/dL — ABNORMAL HIGH (ref 65–99)
Glucose-Capillary: 264 mg/dL — ABNORMAL HIGH (ref 65–99)

## 2016-11-28 LAB — PROTIME-INR
INR: 3.36
Prothrombin Time: 33.8 seconds — ABNORMAL HIGH (ref 11.4–15.2)

## 2016-11-28 LAB — MRSA CULTURE: Culture: NOT DETECTED

## 2016-11-28 LAB — PROCALCITONIN: Procalcitonin: 8.27 ng/mL

## 2016-11-28 MED ORDER — RESOURCE THICKENUP CLEAR PO POWD
ORAL | Status: DC | PRN
  Filled 2016-11-28: qty 125

## 2016-11-28 MED ORDER — AMIODARONE HCL 200 MG PO TABS
200.0000 mg | ORAL_TABLET | Freq: Every day | ORAL | Status: DC
  Administered 2016-11-28 – 2016-12-04 (×6): 200 mg via ORAL
  Filled 2016-11-28 (×6): qty 1

## 2016-11-28 MED ORDER — INSULIN ASPART 100 UNIT/ML ~~LOC~~ SOLN
0.0000 [IU] | SUBCUTANEOUS | Status: DC
  Administered 2016-11-28 – 2016-11-29 (×2): 3 [IU] via SUBCUTANEOUS
  Administered 2016-11-29 (×2): 5 [IU] via SUBCUTANEOUS
  Administered 2016-11-29 – 2016-11-30 (×2): 3 [IU] via SUBCUTANEOUS
  Administered 2016-11-30: 2 [IU] via SUBCUTANEOUS
  Administered 2016-11-30: 3 [IU] via SUBCUTANEOUS
  Administered 2016-11-30: 2 [IU] via SUBCUTANEOUS
  Administered 2016-11-30: 5 [IU] via SUBCUTANEOUS
  Administered 2016-11-30: 8 [IU] via SUBCUTANEOUS
  Administered 2016-12-01: 3 [IU] via SUBCUTANEOUS
  Administered 2016-12-01 (×2): 2 [IU] via SUBCUTANEOUS
  Administered 2016-12-01 – 2016-12-02 (×2): 3 [IU] via SUBCUTANEOUS
  Administered 2016-12-02: 2 [IU] via SUBCUTANEOUS
  Administered 2016-12-02: 3 [IU] via SUBCUTANEOUS
  Administered 2016-12-02: 2 [IU] via SUBCUTANEOUS
  Administered 2016-12-02 – 2016-12-03 (×5): 3 [IU] via SUBCUTANEOUS
  Administered 2016-12-04: 2 [IU] via SUBCUTANEOUS
  Administered 2016-12-04 – 2016-12-05 (×3): 3 [IU] via SUBCUTANEOUS

## 2016-11-28 MED ORDER — ASPIRIN 81 MG PO CHEW
81.0000 mg | CHEWABLE_TABLET | Freq: Every day | ORAL | Status: DC
  Administered 2016-11-28 – 2016-12-04 (×6): 81 mg via ORAL
  Filled 2016-11-28 (×6): qty 1

## 2016-11-28 MED ORDER — VANCOMYCIN HCL IN DEXTROSE 1-5 GM/200ML-% IV SOLN
1000.0000 mg | INTRAVENOUS | Status: AC
  Administered 2016-11-28: 1000 mg via INTRAVENOUS
  Filled 2016-11-28: qty 200

## 2016-11-28 MED ORDER — VANCOMYCIN HCL IN DEXTROSE 1-5 GM/200ML-% IV SOLN: 1000.0000 mg | INTRAVENOUS | Status: DC

## 2016-11-28 NOTE — Progress Notes (Signed)
ANTICOAGULATION CONSULT NOTE - FOLLOW UP  Pharmacy Consult:  Heparin Indication: atrial fibrillation  No Known Allergies  Patient Measurements: Height: 5\' 8"  (172.7 cm) Weight: 189 lb 2.5 oz (85.8 kg) IBW/kg (Calculated) : 68.4 Heparin Dosing Weight: 87 kg  Vital Signs: Temp: 98 F (36.7 C) (11/16 0318) Temp Source: Oral (11/16 0318) BP: 123/67 (11/16 0700) Pulse Rate: 79 (11/16 0700)  Labs: Recent Labs    11/25/2016 1455 11/22/2016 2306 11/27/16 0320 11/28/16 0400  HGB 10.6*  --  10.0* 9.1*  HCT 34.1*  --  31.9* 29.4*  PLT 275  --  228 224  LABPROT 26.2*  --  27.7* 33.8*  INR 2.43  --  2.61 3.36  CREATININE 4.81*  --  4.79* 3.61*  TROPONINI  --  0.21*  --   --     Estimated Creatinine Clearance: 16.2 mL/min (A) (by C-G formula based on SCr of 3.61 mg/dL (H)).    Assessment: 72 YOM presented to the ED with increasing weakness and diarrhea. He is on chronic Coumadin for history of Afib and Pharmacy consulted to transition patient to IV heparin when INR is less than 2.  INR is trending up and currently supra-therapeutic, last Coumadin dose on 11/25/16.  No bleeding reported.   Goal of Therapy:  Heparin level 0.3-0.7 units/ml Monitor platelets by anticoagulation protocol: Yes    Plan:  Start IV heparin when INR<2 Daily INR   Gari Trovato D. Mina Marble, PharmD, BCPS Pager:  [phone] 11/28/2016, 7:33 AM

## 2016-11-28 NOTE — Evaluation (Signed)
Clinical/Bedside Swallow Evaluation Patient Details  Name: [name] MRN: [id number] Date of Birth: 1930-07-22  Today's Date: 11/28/2016 Time: SLP Start Time (ACUTE ONLY): 1330 SLP Stop Time (ACUTE ONLY): 1400 SLP Time Calculation (min) (ACUTE ONLY): 30 min  Past Medical History:  Past Medical History:  Diagnosis Date  . Anemia   . Asthma   . Atrial fibrillation ([place]) 12/08/2013  . CHF (congestive heart failure) ([place])   . Chronic joint pain    "from RA" (10/01/2016)  . CKD (chronic kidney disease), stage III ([place])   . Constipation    takes stool softener daily  . COPD (chronic obstructive pulmonary disease) ([place])   . Coronary artery disease   . Coronary artery disease involving native coronary artery of native heart with angina pectoris ([place]) 09/30/2016  . Dysrhythmia    a-fib  . GERD (gastroesophageal reflux disease)    takes Zantac daily   . History of blood transfusion    "related to one of my ORs"  . Hyperlipidemia    takes Pravastatin daily  . Hypertension    takes Cardizem,Proscar,and Lisinopril daily  . Hypothyroidism   . Impaired hearing    wears hearing aids  . Joint swelling    "from RA" (10/01/2016)  . Peripheral neuropathy   . Pneumonia 2017  . Rheumatoid arthritis ([place])    "all over; take orencia  q 4 wks" (10/01/2016)  . S/P CABG x 2 10/09/2016   LIMA to LAD, SVG to OM, EVH via right thigh  . S/P Maze operation for atrial fibrillation 10/09/2016   Complete bilateral atrial lesion set using bipolar radiofrequency and cryothermy ablation with clipping of LA appendage  . Type II diabetes mellitus ([place])    Past Surgical History:  Past Surgical History:  Procedure Laterality Date  . ANKLE FUSION Left 2014  . ANKLE FUSION Left 10/02/2011   Performed by Wylene Simmer, MD at [facility] OR  . ARTERIOVENOUS  FISTULA CREATION right arm Right 10/28/2016   Performed by Angelia Mould, MD at [place] THYROID  2014  . CARDIAC CATHETERIZATION  09/30/2016  .  CARPAL TUNNEL RELEASE Bilateral 1990's  . CATARACT EXTRACTION W/ INTRAOCULAR LENS  IMPLANT, BILATERAL Bilateral 1990's  . CLIPPING OF ATRIAL APPENDAGE  10/09/2016   Performed by Rexene Alberts, MD at [place]    . CORONARY ARTERY BYPASS GRAFTING (CABG) x two, using left internal mammary artery and right leg greater saphenous vein harvested endoscopically N/A 10/09/2016   Performed by Rexene Alberts, MD at [place]  . INSERTION OF DIALYSIS CATHETER N/A 10/28/2016   Performed by Angelia Mould, MD at [place]    . LEFT HALLUX AMPUTATION AT THE METATARSOPHALANGEAL JOINT Left 11/08/2015   Performed by Wylene Simmer, MD at [facility]  . MAZE N/A 10/09/2016   Performed by Rexene Alberts, MD at [place] Right 09/06/2013   Performed by Daryll Brod, MD at [facility]  . REPAIR TENDONS FOOT Left 1946; 1984   "farming accident; repaired tendons both times"  . RIGHT THYROIDECTOMY LOBECTOMY Right 02/01/2013   Performed by Armandina Gemma, MD at [facility] OR  . RIGHT/LEFT HEART CATH AND CORONARY ANGIOGRAPHY N/A 09/30/2016   Performed by Nigel Mormon, MD at [place] [facility] LAB  . SHOULDER ARTHROSCOPY W/ ROTATOR CUFF REPAIR Right 1990's  . TONSILLECTOMY  1930's  .  TOTAL KNEE ARTHROPLASTY Right 12/16/2010   Procedure: TOTAL KNEE ARTHROPLASTY; rt Surgeon: Rudean Haskell, MD;  Location: [facility];  Service: Orthopedics;  Laterality: Right;  . TOTAL KNEE ARTHROPLASTY Right 12/16/2010   Performed by Rudean Haskell, MD at [place]  . TOTAL SHOULDER REPLACEMENT Left 03/2010  . TRANSESOPHAGEAL ECHOCARDIOGRAM (TEE) N/A 10/09/2016   Performed by Rexene Alberts, MD at [facility] OR   HPI:  83 year old male who presented to ER 11/14 with ongoing diarrhea, worsening shortness of breath, cough, fever of 103, abdominal distention and discomfort. Dialysis yesterday was not fully completed due to hypotension. Patient is on  midodrine for chronic hypotension. Found to have concern for sepis (?abd vs pulm source) admitted for work up. CT abd concerning for possible early distal small bowel ischemia. C-Diff Ag + / Toxin negative. CXR concerning for bibasilar atelectasis / effusion and patchy atelectasis.  Of note, patient had hospitalization in September for SOB, found to have multivessel CAD s/p CABG 04/26/22 complicated by renal failure and pneumothorax. Patient discharged to rehab over the last month but discharged home on Friday.   Assessment / Plan / Recommendation Clinical Impression   Pt is known to therapist from previous hospital admission and presents with multiple factors impacting swallowing safety including worsening respiratory function with increased work of breathing and worsening debility in the setting of acute illness.  Pt has history of dysphagia and has been on nectar thick liquids since 11/12/2016.   Pt and daughter verbalize compliance with recommended diet but daughter does report that pt is at times left alone with his wife who has dementia.  Pt also has memory deficits and daughter questions whether wife is giving pt thin liquids when she is not there to correct her.  Pt is known to tolerate honey thick liquids well both clinically and on objective assessment.  Given pt's tenuous and complicated medical picture, downgrading pt to honey thick liquids appears warranted at this time.  Prognosis for advancement is guarded given his multiple medical issues that have thusfar been difficult to manage and appear to be contributing to his dysphagia.  For now, immediate repeat MBS does not appear to be indicated given that it would likely confirm deterioration in function due to debility.  Furthermore, pt has had 2 recent MBS studies on 10/31 and 11/8 showing minimal change.  Will defer initiation of solids to medical team pending bowel work up.   SLP Visit Diagnosis: Dysphagia, pharyngeal phase (R13.13)     Aspiration Risk  Moderate aspiration risk;Severe aspiration risk    Diet Recommendation Honey-thick liquid   Liquid Administration via: Cup Medication Administration: Crushed with puree Supervision: Full supervision/cueing for compensatory strategies;Patient able to self feed Compensations: Slow rate;Small sips/bites;Hard cough after swallow;Multiple dry swallows after each bite/sip Postural Changes: Seated upright at 90 degrees    Other  Recommendations Oral Care Recommendations: Oral care BID   Follow up Recommendations Skilled Nursing facility      Frequency and Duration min 3x week          Prognosis Prognosis for Safe Diet Advancement: Guarded Barriers to Reach Goals: Other (Comment)(medical complexity)      Swallow Study   General Date of Onset: 12/01/2016 HPI: 83 year old male who presented to ER 11/14 with ongoing diarrhea, worsening shortness of breath, cough, fever of 103, abdominal distention and discomfort. Dialysis yesterday was not fully completed due to hypotension. Patient is on midodrine for chronic hypotension. Found to have concern for  sepis (?abd vs pulm source) admitted for work up. CT abd concerning for possible early distal small bowel ischemia. C-Diff Ag + / Toxin negative. CXR concerning for bibasilar atelectasis / effusion and patchy atelectasis.  Of note, patient had hospitalization in September for SOB, found to have multivessel CAD s/p CABG 0/26/37 complicated by renal failure and pneumothorax. Patient discharged to rehab over the last month but discharged home on Friday. Type of Study: Bedside Swallow Evaluation Previous Swallow Assessment: MBS 11/8 Diet Prior to this Study: Thin liquids Temperature Spikes Noted: Yes(upon admission, no fever over the last 24 hours prior to eva) Respiratory Status: Nasal cannula History of Recent Intubation: Yes Length of Intubations (days): 6 days Date extubated: 10/20/16 Behavior/Cognition:  Alert;Cooperative;Pleasant mood Oral Cavity Assessment: Dry Oral Cavity - Dentition: Dentures, top;Dentures, bottom Vision: Functional for self-feeding Self-Feeding Abilities: Able to feed self Patient Positioning: Upright in bed Baseline Vocal Quality: Breathy;Low vocal intensity Volitional Cough: Congested Volitional Swallow: Able to elicit    Oral/Motor/Sensory Function Overall Oral Motor/Sensory Function: Within functional limits   Ice Chips     Thin Liquid Thin Liquid: Impaired Presentation: Cup Pharyngeal  Phase Impairments: Suspected delayed Swallow;Decreased hyoid-laryngeal movement    Nectar Thick Nectar Thick Liquid: Impaired Pharyngeal Phase Impairments: Suspected delayed Swallow;Decreased hyoid-laryngeal movement   Honey Thick     Puree Puree: Not tested   Solid   GO   Solid: Not tested        Skarlett Sedlacek, Elmyra Ricks L 11/28/2016,2:13 PM

## 2016-11-28 NOTE — Progress Notes (Signed)
[facility] Progress Note     Subjective: CC: feels weak this AM Patient reports he is feeling a little weak and tired this AM, thinks that maybe this is due to dialysis. Reports abdominal pain in the RLQ is only really present when he coughs. Feels like pain is improved from yesterday. Denies nausea or vomiting. Having BMs.   Objective: Vital signs in last 24 hours: Temp:  [97.3 F (36.3 C)-98 F (36.7 C)] 98 F (36.7 C) (11/16 0318) Pulse Rate:  [56-115] 79 (11/16 0700) Resp:  [12-42] 28 (11/16 0700) BP: (98-132)/(49-75) 123/67 (11/16 0700) SpO2:  [85 %-100 %] 100 % (11/16 0739) Weight:  [85.8 kg (189 lb 2.5 oz)-87.8 kg (193 lb 9 oz)] 85.8 kg (189 lb 2.5 oz) (11/16 0215) Last BM Date: 11/27/16  Intake/Output from previous day: 11/15 0701 - 11/16 0700 In: 70 [I.V.:20; IV Piggyback:50] Out: 2000  Intake/Output this shift: No intake/output data recorded.  PE: Gen:  Alert, NAD, pleasant Card:  Regular rate, irregularly irregular rhythm Pulm:  Normal effort Abd: Soft, mildly TTP along right side, distended, bowel sounds hypoactive, no HSM, no rebound or guarding Skin: warm and dry, no rashes   Lab Results:  Recent Labs    11/27/16 0320 11/28/16 0400  WBC 29.3* 17.3*  HGB 10.0* 9.1*  HCT 31.9* 29.4*  PLT 228 224   BMET Recent Labs    11/27/16 0320 11/28/16 0400  NA 139 137  K 4.6 3.8  CL 103 99*  CO2 22 22  GLUCOSE 119* 158*  BUN 46* 32*  CREATININE 4.79* 3.61*  CALCIUM 8.0* 8.0*   PT/INR Recent Labs    11/27/16 0320 11/28/16 0400  LABPROT 27.7* 33.8*  INR 2.61 3.36   CMP     Component Value Date/Time   NA 137 11/28/2016 0400   K 3.8 11/28/2016 0400   CL 99 (L) 11/28/2016 0400   CO2 22 11/28/2016 0400   GLUCOSE 158 (H) 11/28/2016 0400   BUN 32 (H) 11/28/2016 0400   CREATININE 3.61 (H) 11/28/2016 0400   CALCIUM 8.0 (L) 11/28/2016 0400   PROT 6.0 (L) 12/02/2016 1455   ALBUMIN 2.3 (L) 11/27/2016 1455   AST 31 11/25/2016 1455   ALT  24 11/17/2016 1455   ALKPHOS 97 12/12/2016 1455   BILITOT 1.6 (H) 12/07/2016 1455   GFRNONAA 14 (L) 11/28/2016 0400   GFRAA 16 (L) 11/28/2016 0400   Lipase     Component Value Date/Time   LIPASE 23 10/16/2016 1515       Studies/Results: Ct Abdomen Pelvis Wo Contrast  Result Date: 12/10/2016 CLINICAL DATA:  Generalized weakness with nausea EXAM: CT ABDOMEN AND PELVIS WITHOUT CONTRAST TECHNIQUE: Multidetector CT imaging of the abdomen and pelvis was performed following the standard protocol without IV contrast. COMPARISON:  10/16/2016 FINDINGS: Lower chest: Moderate left pleural effusion is noted. Mild left lower lobe atelectatic changes are seen. No other focal infiltrate is noted. Hepatobiliary: No focal liver abnormality is seen. No gallstones, gallbladder wall thickening, or biliary dilatation. Pancreas: Unremarkable. No pancreatic ductal dilatation or surrounding inflammatory changes. Spleen: Normal in size without focal abnormality. Adrenals/Urinary Tract: The adrenal glands are within normal limits. The kidneys demonstrates some cystic change stable from the previous exam. No renal calculi or obstructive changes are noted. The bladder is decompressed. Stomach/Bowel: Colon is well distended with both fluid and fecal material. No obstructive changes are seen. The appendix is not well visualized although no inflammatory changes to suggest appendicitis are seen. Mildly  prominent loops of small bowel are noted in the mid abdomen containing fluid which may represent some mild enteritis. No definitive transition zone is identified. The distal aspect of the ileum however there are a few loops which have changes suggestive of pneumatosis. No portal venous gas is noted. Very mild inflammatory changes are noted surrounding the distal ileum. Vascular/Lymphatic: Aortic atherosclerosis. No enlarged abdominal or pelvic lymph nodes. Reproductive: Prostate is unremarkable. Other: No abdominal wall hernia or  abnormality. No abdominopelvic ascites. Musculoskeletal: Degenerative changes of lumbar spine are noted. IMPRESSION: Mild small bowel distention without definitive transition zone. A few of the loops in the distal ileum demonstrate findings suggestive of pneumatosis. This could represent some very early distal small bowel ischemia. No portal venous air is noted. Correlation to the physical exam is recommended. Moderate left pleural effusion with associated atelectatic changes. No other focal abnormality is seen. Critical Value/emergent results were called by telephone at the time of interpretation on 12/04/2016 at 5:05 pm to Dr. Merrily Pew , who verbally acknowledged these results. Electronically Signed   By: Inez Catalina M.D.   On: 12/06/2016 17:06   Dg Chest 2 View  Result Date: 11/22/2016 CLINICAL DATA:  Increasing weakness, diarrhea and vomiting since last night. Began hemodialysis approximately 6 weeks ago. EXAM: CHEST  2 VIEW COMPARISON:  Radiographs 11/13/2016 and 11/07/2016. FINDINGS: Right IJ dialysis catheters appear unchanged at the level of the superior cavoatrial junction. There is stable cardiomegaly status post CABG and left atrial appendage clipping. There are lower lung volumes with increased patchy opacities at both lung bases, likely atelectasis. Vascular congestion is present without overt pulmonary edema, confluent airspace opacity or significant pleural effusion. IMPRESSION: Vascular congestion with increased patchy bibasilar opacities, likely atelectasis. No definite edema. Electronically Signed   By: Richardean Sale M.D.   On: 11/21/2016 16:52   Dg Chest Port 1 View  Result Date: 11/28/2016 CLINICAL DATA:  83 year old male with new end-stage renal disease on hemodialysis presenting with diarrhea abdominal pain fever cough and shortness of breath. Possible sepsis. EXAM: PORTABLE CHEST 1 VIEW COMPARISON:  11/27/2016 and earlier. FINDINGS: Portable AP semi upright view at 0411 hours.  Stable lung volumes. Regressed veiling opacity at the left lung base with now partial visualization of the left hemidiaphragm. Stable cardiomegaly and mediastinal contours. Stable right chest dialysis catheter. Prior CABG. Stable pulmonary vascularity. The right lung otherwise remains clear. Stable visualized osseous structures. No pneumoperitoneum identified. IMPRESSION: 1. Partial regression of left pleural effusion and lung base opacity. 2. Stable vascular congestion without overt edema. 3. No new cardiopulmonary abnormality. Electronically Signed   By: Genevie Ann M.D.   On: 11/28/2016 07:27   Dg Chest Port 1 View  Result Date: 11/27/2016 CLINICAL DATA:  Respiratory failure EXAM: PORTABLE CHEST 1 VIEW COMPARISON:  11/17/2016 FINDINGS: Mild enlargement of the cardiopericardial silhouette noted with obscuration of the left lung base and hazy opacity in the left mid lung favoring layering effusion. Prior CABG and left atrial appendage clamp. Right IJ dialysis catheter tip: Right atrium. Atherosclerotic calcification of the aortic arch. Left proximal humeral implant. Subsegmental atelectasis at the right lung base. Mildly indistinct pulmonary vasculature. IMPRESSION: 1. Worsening airspace opacities left lung base likely representing combination of pleural effusion and atelectasis. There is subsegmental atelectasis at the right lung base. 2. Cardiomegaly with pulmonary venous hypertension. 3. Dialysis catheter tip: Right atrium. Electronically Signed   By: Van Clines M.D.   On: 11/27/2016 07:13    Anti-infectives: Anti-infectives (From admission, onward)  Start     Dose/Rate Route Frequency Ordered Stop   11/29/16 1200  vancomycin (VANCOCIN) IVPB 1000 mg/200 mL premix     1,000 mg 200 mL/hr over 60 Minutes Intravenous Every T-Th-Sa (Hemodialysis) 11/28/16 0659     11/28/16 0700  vancomycin (VANCOCIN) IVPB 1000 mg/200 mL premix     1,000 mg 200 mL/hr over 60 Minutes Intravenous STAT 11/28/16 0658  11/29/16 0700   11/27/16 1800  ceFEPIme (MAXIPIME) 1 g in dextrose 5 % 50 mL IVPB     1 g 100 mL/hr over 30 Minutes Intravenous Every 24 hours 11/19/2016 1745     12/10/2016 1700  vancomycin (VANCOCIN) 50 mg/mL oral solution 125 mg  Status:  Discontinued     125 mg Oral Every 6 hours 11/23/2016 1638 11/27/16 1019   11/29/2016 1530  vancomycin (VANCOCIN) IVPB 1000 mg/200 mL premix  Status:  Discontinued     1,000 mg 200 mL/hr over 60 Minutes Intravenous  Once 11/23/2016 1520 11/16/2016 1521   11/17/2016 1530  ceFEPIme (MAXIPIME) 2 g in dextrose 5 % 50 mL IVPB     2 g 100 mL/hr over 30 Minutes Intravenous  Once 12/12/2016 1520 11/15/2016 1702   11/29/2016 1530  vancomycin (VANCOCIN) 2,000 mg in sodium chloride 0.9 % 500 mL IVPB     2,000 mg 250 mL/hr over 120 Minutes Intravenous  Once 11/17/2016 1521 12/04/2016 1829       Assessment/Plan Severe Sepsis ESRD CAD/Hx of recent CABG A. 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

## 2016-11-28 NOTE — Progress Notes (Signed)
[facility] Progress Note Patient Name: [name] DOB: Oct 28, 1930 MRN: [id number]   Date of Service  11/28/2016  HPI/Events of Note  Notified by bedside nurse of hyperglycemia. Notable acute renal failure.   eICU Interventions  1. Accu-Cheks every 4 hours 2. Sliding-scale insulin per moderate algorithm ordered      Intervention Category Intermediate Interventions: Hyperglycemia - evaluation and treatment  Tera Partridge 11/28/2016, 8:08 PM

## 2016-11-28 NOTE — Progress Notes (Signed)
Pt transferred to 45M11, VSS/NAD. Family notified of transfer. 45M RN's at bedside to continue care.   Pres[place]illa Sours, Therapist, sports

## 2016-11-28 NOTE — Progress Notes (Signed)
[facility] Progress Note  Subjective: feels better, 2L off w HD yest.  Breathing better.   Vitals:   11/28/16 1100 11/28/16 1136 11/28/16 1205 11/28/16 1300  BP:   115/65 (!) 117/58  Pulse: 82  84 88  Resp: 18  (!) 23 14  Temp:  97.9 F (36.6 C)    TempSrc:  Axillary    SpO2: 100%  100% 100%  Weight:      Height:        Inpatient medications: . amiodarone  200 mg Oral Daily  . arformoterol  15 mcg Nebulization BID  . aspirin  81 mg Oral Daily  . budesonide (PULMICORT) nebulizer solution  0.5 mg Nebulization BID  . chlorhexidine  15 mL Mouth Rinse BID  . hydrocortisone sod succinate (SOLU-CORTEF) inj  50 mg Intravenous Q6H  . mouth rinse  15 mL Mouth Rinse q12n4p   . sodium chloride    . sodium chloride    . sodium chloride    . ceFEPime (MAXIPIME) IV Stopped (11/27/16 1830)   sodium chloride, sodium chloride, sodium chloride, alteplase, heparin, lidocaine (PF), lidocaine-prilocaine, pentafluoroprop-tetrafluoroeth, RESOURCE THICKENUP CLEAR  Exam: General: Ill appearing caucasian male Neck: Supple. JVD with mild elevation. CV: S1,S2, regularly irreg. No M/G/R Lungs: improved, mostly clear on L , R still some basilar crackles Abdomen: distended with active BS.  M-S:  Strength and tone appear normal for age. Lower extremities:Trace BLE edema.  Neuro: Alert and oriented X 3. Dialysis Access: Maturing RUA AVF + bruit RIJ TDC drsg CDI.   Dialysis Orders: [facility] TTS 4h   90kg  2/2.25  Hep 2000  R IJ TDC/ maturing RUA AVF   Assessment: 1.  Sepsis - unclear cause, possible ischemic SB w pneumatosis.  CCS following, abd pain better. Vanc/cefepime per primary.  2.  Acute resp failure/ pulm edema - looks better after HD. Breathing better, abd less distended. Wt's down, under dry wt.  3.  ESRD - recent start. TTS HD.  HD tomorrow 4.  Hypertension/volume  - BP on soft side.  5.  Anemia  - HGB 10.0 No ESA needed yet. Follow HGB. Fe 32 Tsat 18 11/25/16 however  will hold off Fe load D/T sepsis.  6.  Metabolic bone disease - Add renal function panel to today's labs. No binders/VDRA yet.  7.  Nutrition -NPO except ice chips. Albumin 2.3 8.  Afib: continue coumadin per primary/pharmacy. 9. GOC: Patient is DNR.   Plan - as above   Kelly Splinter MD [facility] pager [phone]   11/28/2016, 2:40 PM   Recent Labs  Lab 11/20/2016 1455 11/27/16 0320 11/28/16 0400  NA 136 139 137  K 5.2* 4.6 3.8  CL 94* 103 99*  CO2 26 22 22   GLUCOSE 153* 119* 158*  BUN 38* 46* 32*  CREATININE 4.81* 4.79* 3.61*  CALCIUM 8.5* 8.0* 8.0*  PHOS  --  4.8*  --    Recent Labs  Lab 12/12/2016 1455  AST 31  ALT 24  ALKPHOS 97  BILITOT 1.6*  PROT 6.0*  ALBUMIN 2.3*   Recent Labs  Lab 11/27/2016 1455 11/27/16 0320 11/28/16 0400  WBC 42.7* 29.3* 17.3*  NEUTROABS 36.3*  --   --   HGB 10.6* 10.0* 9.1*  HCT 34.1* 31.9* 29.4*  MCV 97.7 96.7 95.5  PLT 275 228 224   Iron/TIBC/Ferritin/ %Sat    Component Value Date/Time   IRON 21 (L) 11/05/2016 0502   TIBC 190 (L) 11/05/2016 0502  IRONPCTSAT 11 (L) 11/05/2016 0502

## 2016-11-28 NOTE — Progress Notes (Signed)
HD tx completed @ 0200 w/o problem, UF goal met, blood rinsed back, VSS, report given to Scot Jun, RN

## 2016-11-28 NOTE — Progress Notes (Signed)
PULMONARY / CRITICAL CARE MEDICINE H&P   Name: [name] MRN: [id number] DOB: 12-Sep-1930    ADMISSION DATE:  11/23/2016 CONSULTATION DATE:  12/07/2016  REFERRING MD:  Dr. Dayna Barker  CHIEF COMPLAINT:  SIRS  BRIEF SUMMARY:   83 year old male who presented to ER 11/14 with ongoing diarrhea, worsening shortness of breath, cough, fever of 103, abdominal distention and discomfort.  Dialysis yesterday was not fully completed due to hypotension.  Patient is on midodrine for chronic hypotension. Found to have concern for sepis (?abd vs pulm source) admitted for work up.  CT abd concerning for possible early distal small bowel ischemia.  C-Diff Ag + / Toxin negative.  CXR concerning for bibasilar atelectasis / effusion and patchy atelectasis.   Of note, patient had hospitalization in September for SOB, found to have multivessel CAD s/p CABG 7/82/95 complicated by renal failure and pneumothorax.  Patient discharged to rehab over the last month but discharged home on Friday. Now ESRD on HD     SUBJECTIVE:  Pt reports feeling "much better".  States he only has intermittent pain in his RLQ with coughing or when we "push on it".  Has a breakfast tray, feels hungry.  One large loose BM yesterday.  None since.    VITAL SIGNS: BP 128/69   Pulse (!) 111   Temp (!) 97.4 F (36.3 C) (Oral)   Resp (!) 25   Ht 5\' 8"  (1.727 m)   Wt 189 lb 2.5 oz (85.8 kg)   SpO2 100%   BMI 28.76 kg/m   HEMODYNAMICS:    VENTILATOR SETTINGS:    INTAKE / OUTPUT: I/O last 3 completed shifts: In: 24 [I.V.:20; IV Piggyback:50] Out: 2000 [Other:2000]  PHYSICAL EXAMINATION: General: elderly male in NAD, sitting up in bed  HEENT: MM pink/moist, no jvd  Neuro: AAOx4, speech clear, MAE  CV: s1s2 rrr, no m/r/g PULM: even/non-labored, lungs bilaterally clear anterior, diminished bases  AO:ZHYQ, non-tender, bsx4 active  Extremities: warm/dry, no edema  Skin: no rashes or lesions  LABS:  BMET Recent Labs  Lab  11/16/2016 1455 11/27/16 0320 11/28/16 0400  NA 136 139 137  K 5.2* 4.6 3.8  CL 94* 103 99*  CO2 26 22 22   BUN 38* 46* 32*  CREATININE 4.81* 4.79* 3.61*  GLUCOSE 153* 119* 158*    Electrolytes Recent Labs  Lab 11/19/2016 1455 11/27/16 0320 11/28/16 0400  CALCIUM 8.5* 8.0* 8.0*  MG  --  1.8  --   PHOS  --  4.8*  --     CBC Recent Labs  Lab 11/14/2016 1455 11/27/16 0320 11/28/16 0400  WBC 42.7* 29.3* 17.3*  HGB 10.6* 10.0* 9.1*  HCT 34.1* 31.9* 29.4*  PLT 275 228 224    Coag's Recent Labs  Lab 11/27/2016 1455 11/27/16 0320 11/28/16 0400  INR 2.43 2.61 3.36    Sepsis Markers Recent Labs  Lab 12/11/2016 1540 12/10/2016 1755 11/13/2016 2306 11/27/16 0320 11/28/16 0400  LATICACIDVEN 4.80* 2.27* 1.9  --   --   PROCALCITON  --   --  11.22 9.19 8.27    ABG No results for input(s): PHART, PCO2ART, PO2ART in the last 168 hours.  Liver Enzymes Recent Labs  Lab 11/25/2016 1455  AST 31  ALT 24  ALKPHOS 97  BILITOT 1.6*  ALBUMIN 2.3*    Cardiac Enzymes Recent Labs  Lab 11/18/2016 2306  TROPONINI 0.21*    Glucose Recent Labs  Lab 11/27/16 0114 11/27/16 1107 11/27/16 1534 11/27/16 1945 11/28/16  0316 11/28/16 0803  GLUCAP 123* 146* 164* 167* 145* 157*    Imaging Dg Chest Port 1 View  Result Date: 11/28/2016 CLINICAL DATA:  83 year old male with new end-stage renal disease on hemodialysis presenting with diarrhea abdominal pain fever cough and shortness of breath. Possible sepsis. EXAM: PORTABLE CHEST 1 VIEW COMPARISON:  11/27/2016 and earlier. FINDINGS: Portable AP semi upright view at 0411 hours. Stable lung volumes. Regressed veiling opacity at the left lung base with now partial visualization of the left hemidiaphragm. Stable cardiomegaly and mediastinal contours. Stable right chest dialysis catheter. Prior CABG. Stable pulmonary vascularity. The right lung otherwise remains clear. Stable visualized osseous structures. No pneumoperitoneum identified.  IMPRESSION: 1. Partial regression of left pleural effusion and lung base opacity. 2. Stable vascular congestion without overt edema. 3. No new cardiopulmonary abnormality. Electronically Signed   By: Genevie Ann M.D.   On: 11/28/2016 07:27    STUDIES:  CT abd/pelvis 11/14 > Mild small bowel distention without definitive transition zone. A few of the loops in the distal ileum demonstrate findings suggestive of pneumatosis. This could represent some very early distal small bowel ischemia. No portal venous air is noted. Correlation to the physical exam is recommended. Moderate left pleural effusion with associated atelectatic changes. No other focal abnormality is seen.  CULTURES: Blood 11/14 >> GI panel PCR 11/14 >> negative  Cdif 11/14 > toxin neg, antigen pos  ANTIBIOTICS: Cefepime 11/14 >> Vancomycin 11/14 >> 11/16  SIGNIFICANT EVENTS: 09/2016 CABG, ICU stay 1 month, 2 weeks rehab dc home 11/9 11/13  half HD due to hypotension 11/14  Admit    LINES/TUBES:   DISCUSSION: 60 M with recent CABG and prolonged ICU stay now ESRD. Admitted for severe sepsis 11/14 with GI likely source.   ASSESSMENT / PLAN:  PULMONARY A: Acute hypoxemic respiratory failure. Likely related to volume overload, ddx includes HCAP and effusion.  L effusion COPD without acute exacerbation Suspect Element Aspiration  P:   O2 as needed for sats > 90% Monitor CXR for pleural effusion size  Continue Brovana + Pulmicort  Hold home Breo  Flutter valve / pulmonary hygiene  SLP consult   CARDIOVASCULAR A:  Severe sepsis CAD s/p CABG 09/2016 Afib s/p MAZE 09/2016. Currently AF with controlled vent response.  P:  ICU monitoring  DNR / DNI in the event of decline  Continue stress dose steroids  Hold home metoprolol Resume home amiodarone, ASA   RENAL A:   ESRD  Hyperkalemia P:   Nephrology following, appreciate input > not a CRRT candidate per Renal  Trend BMP / urinary output Replace electrolytes as  indicated Avoid nephrotoxic agents, ensure adequate renal perfusion  GASTROINTESTINAL A:   Small Bowel Pneumatosis secondary to Ischemia - presumed due to hypotension / volume depletion  Nausea / Vomiting / Diarrhea - cdiff antigen positive / toxin negative  P:   Sips of clears / ice chips  CCS following, appreciate input.  Agree that he would be a poor surgical candidate.  ABX as below  SLP eval once cleared for intake per surgery   HEMATOLOGIC A:   Warfarin coagulopathy P:  Heparin gtt per pharmacy  Trend CBC Hold home coumadin   INFECTIOUS A:   Sepsis - etiology not entirely clear. Possibly colitis based on symptoms and pneumatosis, however, CXR does not rule out PNA. Cdiff toxin negative.  P:   ABX as above  Discontinue Vancomycin 11/16 Follow cultures  Monitor fever curve / WBC   ENDOCRINE A:  Hypothyroid DM RA on prednisone, orencia P:   Continue stress dose steroids  Synthroid  Orencia on hold since September   NEUROLOGIC A:   Acute metabolic encephalopathy - resolved P:   PT consult  Supportive care  Likely will need SNF rehab  FAMILY  - Updates: No family at bedside am 11/16.  Patient updated on plan of care.  Transfer to SDU / [facility] as of 11/17 am.  11/15 Goals of Care - Daughter updated on plan.  Discussed plan of care going forward > will push for full medical care, rehab if possible but if he declines family would want DNR/DNI and transition to comfort.  Thus far he has stabilized.  Will continue supportive care.     - Inter-disciplinary family meet or Palliative Care meeting due by:  11/22   Noe Gens, NP-C [facility] Pgr: [phone] or if no answer [phone] 11/28/2016, 8:47 AM

## 2016-11-28 NOTE — Evaluation (Signed)
Physical Therapy Evaluation Patient Details Name: [name] MRN: [id number] DOB: [birth year] Today's Date: 11/28/2016   History of Present Illness  83 year old male who presented to ER 11/14 with ongoing diarrhea, worsening shortness of breath, cough, fever of 103, abdominal distention and discomfort.  Dialysis yesterday was not fully completed due to hypotension.  Patient is on midodrine for chronic hypotension. Found to have concern for sepis (?abd vs pulm source) admitted for work up.  CT abd concerning for possible early distal small bowel ischemia.  C-Diff Ag + / Toxin negative.  CXR concerning for bibasilar atelectasis / effusion and patchy atelectasis.  Of note, patient had hospitalization in September for SOB, found to have multivessel CAD s/p CABG 1/32/44 complicated by renal failure and pneumothorax.  Patient discharged to rehab over the last month but discharged home on Friday. PMHx: hypertension, chronic diastolic congestive heart failure, stage IV chronic kidney disease, hyperlipidemia, recurrent PAF on long-term anticoagulation, bronchiectasis with chronic dyspnea on exertion, COPD, asthma, RA, DM, and anemia   Clinical Impression  Pt admitted with above diagnosis. Pt currently with functional limitations due to the deficits listed below (see PT Problem List). Pt was able to take pivotal steps to recliner from bed with bil UE support.  Will need SNF to reach maximal functional level.  Family has difficulty caring for him at home. Will follow acutely. Pt will benefit from skilled PT to increase their independence and safety with mobility to allow discharge to the venue listed below.      Follow Up Recommendations SNF;Supervision/Assistance - 24 hour    Equipment Recommendations  Wheelchair (measurements PT);Wheelchair cushion (measurements PT)    Recommendations for Other Services       Precautions / Restrictions Precautions Precautions: Fall;Sternal Precaution Comments: recent  CABG       Mobility  Bed Mobility Overal bed mobility: Needs Assistance Bed Mobility: Rolling;Sidelying to Sit Rolling: Min assist Sidelying to sit: Mod assist       General bed mobility comments: Needed incr assist for elevation of trunk.   Transfers Overall transfer level: Needs assistance Equipment used: 2 person hand held assist Transfers: Sit to/from [facility] Sit to Stand: Min assist Stand pivot transfers: Min assist;+2 physical assistance       General transfer comment: Bil UE support to pivot to recliner from bed  Ambulation/Gait                Stairs            Wheelchair Mobility    Modified Rankin (Stroke Patients Only)       Balance Overall balance assessment: Needs assistance Sitting-balance support: No upper extremity supported;Feet supported Sitting balance-Leahy Scale: Poor Sitting balance - Comments: slight posterior lean needing min assist to sit EOB. Postural control: Posterior lean Standing balance support: Bilateral upper extremity supported;During functional activity Standing balance-Leahy Scale: Poor Standing balance comment: relies on bil UE support.                              Pertinent Vitals/Pain Pain Assessment: No/denies pain  VSS  Home Living Family/patient expects to be discharged to:: Private residence Living Arrangements: Spouse/significant other Available Help at Discharge: Family;Available 24 hours/day Type of Home: House Home Access: Ramped entrance     Home Layout: One level Home Equipment: Toilet riser;Grab bars - tub/shower;Shower seat - built in;Grab bars - toilet;Cane - single point;Walker - 4 wheels;Transport chair Additional  Comments: daughter reports that rehab recommended transport chair but it is awful.  They want a manual wheelchair.    Prior Function Level of Independence: Needs assistance   Gait / Transfers Assistance Needed: min assist with rollator  ADL's /  Homemaking Assistance Needed: min to mod assist         Hand Dominance   Dominant Hand: Left    Extremity/Trunk Assessment   Upper Extremity Assessment Upper Extremity Assessment: Defer to OT evaluation    Lower Extremity Assessment Lower Extremity Assessment: Generalized weakness    Cervical / Trunk Assessment Cervical / Trunk Assessment: Kyphotic  Communication   Communication: HOH  Cognition Arousal/Alertness: Awake/alert Behavior During Therapy: WFL for tasks assessed/performed Overall Cognitive Status: Within Functional Limits for tasks assessed                                        General Comments      Exercises General Exercises - Lower Extremity Ankle Circles/Pumps: AROM;Both;10 reps;Supine Long Arc Quad: AROM;Both;10 reps;Seated Hip Flexion/Marching: AROM;Both;10 reps;Seated   Assessment/Plan    PT Assessment Patient needs continued PT services  PT Problem List Decreased strength;Decreased activity tolerance;Decreased balance;Decreased mobility;Decreased knowledge of use of DME;Decreased safety awareness;Decreased knowledge of precautions       PT Treatment Interventions DME instruction;Gait training;Stair training;Functional mobility training;Therapeutic activities;Therapeutic exercise;Balance training;Cognitive remediation;Patient/family education    PT Goals (Current goals can be found in the Care Plan section)  Acute Rehab PT Goals Patient Stated Goal: to go home PT Goal Formulation: With patient Time For Goal Achievement: 12/12/16 Potential to Achieve Goals: Good    Frequency Min 2X/week   Barriers to discharge Decreased caregiver support daughter cannot stay 24 hours anymore    Co-evaluation               AM-PAC PT "6 Clicks" Daily Activity  Outcome Measure Difficulty turning over in bed (including adjusting bedclothes, sheets and blankets)?: Unable Difficulty moving from lying on back to sitting on the side of  the bed? : Unable Difficulty sitting down on and standing up from a chair with arms (e.g., wheelchair, bedside commode, etc,.)?: Unable Help needed moving to and from a bed to chair (including a wheelchair)?: A Lot Help needed walking in hospital room?: Total Help needed climbing 3-5 steps with a railing? : Total 6 Click Score: 7    End of Session Equipment Utilized During Treatment: Gait belt;Oxygen Activity Tolerance: Patient limited by fatigue Patient left: in chair;with call bell/phone within reach;with family/visitor present Nurse Communication: Mobility status PT Visit Diagnosis: Unsteadiness on feet (R26.81);Muscle weakness (generalized) (M62.81)    Time: 6599-3570 PT Time Calculation (min) (ACUTE ONLY): 25 min   Charges:   PT Evaluation $PT Eval Moderate Complexity: 1 Mod PT Treatments $Therapeutic Activity: 8-22 mins   PT G Codes:        Jamair Cato,PT Acute Rehabilitation [phone] (pager)   Denice Paradise 11/28/2016, 4:30 PM

## 2016-11-28 NOTE — Progress Notes (Signed)
HD tx initiated via HD cath w/o problem, pull/push/flush equally w/o problem, VSS, will cont to monitor while on HD tx 

## 2016-11-29 ENCOUNTER — Inpatient Hospital Stay (HOSPITAL_COMMUNITY): Payer: Medicare Other

## 2016-11-29 ENCOUNTER — Other Ambulatory Visit: Payer: Self-pay

## 2016-11-29 DIAGNOSIS — M7989 Other specified soft tissue disorders: Secondary | ICD-10-CM

## 2016-11-29 DIAGNOSIS — E860 Dehydration: Secondary | ICD-10-CM

## 2016-11-29 DIAGNOSIS — J90 Pleural effusion, not elsewhere classified: Secondary | ICD-10-CM

## 2016-11-29 DIAGNOSIS — E875 Hyperkalemia: Secondary | ICD-10-CM

## 2016-11-29 LAB — CBC
HCT: 25.9 % — ABNORMAL LOW (ref 39.0–52.0)
HEMOGLOBIN: 8.2 g/dL — AB (ref 13.0–17.0)
MCH: 29.7 pg (ref 26.0–34.0)
MCHC: 31.7 g/dL (ref 30.0–36.0)
MCV: 93.8 fL (ref 78.0–100.0)
PLATELETS: 189 10*3/uL (ref 150–400)
RBC: 2.76 MIL/uL — AB (ref 4.22–5.81)
RDW: 21.8 % — ABNORMAL HIGH (ref 11.5–15.5)
WBC: 13.2 10*3/uL — ABNORMAL HIGH (ref 4.0–10.5)

## 2016-11-29 LAB — COMPREHENSIVE METABOLIC PANEL
ALBUMIN: 2.1 g/dL — AB (ref 3.5–5.0)
ALK PHOS: 75 U/L (ref 38–126)
ALT: 16 U/L — AB (ref 17–63)
AST: 16 U/L (ref 15–41)
Anion gap: 12 (ref 5–15)
BUN: 53 mg/dL — ABNORMAL HIGH (ref 6–20)
CHLORIDE: 97 mmol/L — AB (ref 101–111)
CO2: 25 mmol/L (ref 22–32)
CREATININE: 4.94 mg/dL — AB (ref 0.61–1.24)
Calcium: 8.2 mg/dL — ABNORMAL LOW (ref 8.9–10.3)
GFR calc non Af Amer: 10 mL/min — ABNORMAL LOW (ref >60)
GFR, EST AFRICAN AMERICAN: 11 mL/min — AB (ref >60)
GLUCOSE: 154 mg/dL — AB (ref 65–99)
Potassium: 4 mmol/L (ref 3.5–5.1)
SODIUM: 134 mmol/L — AB (ref 135–145)
Total Bilirubin: 0.8 mg/dL (ref 0.3–1.2)
Total Protein: 5.1 g/dL — ABNORMAL LOW (ref 6.5–8.1)

## 2016-11-29 LAB — PROTIME-INR
INR: 4.96 — AB
Prothrombin Time: 45.8 seconds — ABNORMAL HIGH (ref 11.4–15.2)

## 2016-11-29 LAB — GLUCOSE, CAPILLARY
GLUCOSE-CAPILLARY: 208 mg/dL — AB (ref 65–99)
GLUCOSE-CAPILLARY: 180 mg/dL — AB (ref 65–99)
Glucose-Capillary: 142 mg/dL — ABNORMAL HIGH (ref 65–99)
Glucose-Capillary: 160 mg/dL — ABNORMAL HIGH (ref 65–99)
Glucose-Capillary: 194 mg/dL — ABNORMAL HIGH (ref 65–99)
Glucose-Capillary: 229 mg/dL — ABNORMAL HIGH (ref 65–99)

## 2016-11-29 MED ORDER — ALBUMIN HUMAN 25 % IV SOLN
INTRAVENOUS | Status: AC
  Administered 2016-11-29: 25 g via INTRAVENOUS
  Filled 2016-11-29: qty 100

## 2016-11-29 MED ORDER — ALTEPLASE 2 MG IJ SOLR: 2.0000 mg | Freq: Once | INTRAMUSCULAR | Status: DC | PRN

## 2016-11-29 MED ORDER — LIDOCAINE-PRILOCAINE 2.5-2.5 % EX CREA
1.0000 "application " | TOPICAL_CREAM | CUTANEOUS | Status: DC | PRN
  Filled 2016-11-29: qty 5

## 2016-11-29 MED ORDER — ALBUMIN HUMAN 25 % IV SOLN
25.0000 g | Freq: Once | INTRAVENOUS | Status: AC
  Administered 2016-11-29: 25 g via INTRAVENOUS

## 2016-11-29 MED ORDER — SODIUM CHLORIDE 0.9 % IV SOLN: 100.0000 mL | INTRAVENOUS | Status: DC | PRN

## 2016-11-29 MED ORDER — HEPARIN SODIUM (PORCINE) 1000 UNIT/ML DIALYSIS
1000.0000 [IU] | INTRAMUSCULAR | Status: DC | PRN
  Administered 2016-11-29: 1000 [IU] via INTRAVENOUS_CENTRAL
  Filled 2016-11-29 (×2): qty 1

## 2016-11-29 MED ORDER — MIDODRINE HCL 5 MG PO TABS
ORAL_TABLET | ORAL | Status: AC
Start: 2016-11-29 — End: 2016-11-29
  Administered 2016-11-29: 10 mg via ORAL
  Filled 2016-11-29: qty 2

## 2016-11-29 MED ORDER — HEPARIN (PORCINE) IN NACL 100-0.45 UNIT/ML-% IJ SOLN
1400.0000 [IU]/h | INTRAMUSCULAR | Status: DC
  Administered 2016-11-29: 1200 [IU]/h via INTRAVENOUS
  Filled 2016-11-29: qty 250

## 2016-11-29 MED ORDER — PENTAFLUOROPROP-TETRAFLUOROETH EX AERO: 1.0000 "application " | INHALATION_SPRAY | CUTANEOUS | Status: DC | PRN

## 2016-11-29 MED ORDER — LIDOCAINE HCL (PF) 1 % IJ SOLN: 5.0000 mL | INTRAMUSCULAR | Status: DC | PRN

## 2016-11-29 MED ORDER — MIDODRINE HCL 5 MG PO TABS
10.0000 mg | ORAL_TABLET | Freq: Once | ORAL | Status: AC
  Administered 2016-11-29: 10 mg via ORAL

## 2016-11-29 MED ORDER — HEPARIN SODIUM (PORCINE) 1000 UNIT/ML DIALYSIS
2000.0000 [IU] | Freq: Once | INTRAMUSCULAR | Status: DC
  Filled 2016-11-29: qty 2

## 2016-11-29 NOTE — Progress Notes (Addendum)
ANTICOAGULATION CONSULT NOTE - FOLLOW UP  Pharmacy Consult:  Heparin Indication: atrial fibrillation  No Known Allergies  Patient Measurements: Height: 5\' 8"  (172.7 cm) Weight: 197 lb 12 oz (89.7 kg) IBW/kg (Calculated) : 68.4 Heparin Dosing Weight: 87 kg  Vital Signs: Temp: 97.3 F (36.3 C) (11/17 1206) Temp Source: Oral (11/17 1206) BP: 107/62 (11/17 1206) Pulse Rate: 83 (11/17 1206)  Labs: Recent Labs    12/03/2016 2306 11/27/16 0320 11/28/16 0400 11/29/16 0746  HGB  --  10.0* 9.1* 8.2*  HCT  --  31.9* 29.4* 25.9*  PLT  --  228 224 189  LABPROT  --  27.7* 33.8* 45.8*  INR  --  2.61 3.36 4.96*  CREATININE  --  4.79* 3.61* 4.94*  TROPONINI 0.21*  --   --   --     Estimated Creatinine Clearance: 12.1 mL/min (A) (by C-G formula based on SCr of 4.94 mg/dL (H)).    Assessment: 8 YOM presented to the ED with increasing weakness and diarrhea. He is on chronic Coumadin for history of Afib and Pharmacy consulted to transition patient to IV heparin when INR is less than 2.   INR remains SUPRAtherapeutic and continues to trend up despite holding doses (INR 4.96 << 3.36, goal of 2-3). LFTs noted to be wnl. Last Coumadin dose on 11/25/16.  No bleeding reported.  Goal of Therapy:  Heparin level 0.3-0.7 units/ml Monitor platelets by anticoagulation protocol: Yes   Plan:  1. No heparin today 2. Will plan to initiate Heparin when INR<2 3. Will continue to monitor for any signs/symptoms of bleeding and will follow up with PT/INR in the a.m.   Thank you for allowing pharmacy to be a part of this patient's care.  Alycia Rossetti, PharmD, BCPS Clinical Pharmacist Pager: [phone] Clinical phone for 11/29/2016 from 7a-3:30p: [phone] If after 3:30p, please call main pharmacy at: [phone] 11/29/2016 12:25 PM    --------------------------------------------------------------------------------------- Addendum:  New LUE DVT found via dopplers today despite SUPRAtherapeutic INR -  MD requesting to go ahead and initiate Heparin.  Goal of Therapy: Heparin level 0.3-0.7 units/ml  Plan: 1. Start Heparin at 1200 units/hr (12 ml/hr) 2. Will continue to monitor for any signs/symptoms of bleeding and will follow up with heparin level in 8 hours   Alycia Rossetti, PharmD, BCPS Pager: [phone] 3:24 PM

## 2016-11-29 NOTE — Progress Notes (Signed)
Texted message sent to Dr Ree Kida about order to in and out cath patient.  Orders received.

## 2016-11-29 NOTE — Progress Notes (Signed)
[facility] Progress Note  Subjective: no new c/o, due for HD today.  ABd pain not a big issue now.    Vitals:   11/29/16 0624 11/29/16 0805 11/29/16 0838 11/29/16 1206  BP:   91/67 107/62  Pulse:  91 88 83  Resp:  (!) 30 16 (!) 24  Temp:   (!) 97.5 F (36.4 C) (!) 97.3 F (36.3 C)  TempSrc:   Oral Oral  SpO2:  98% 100% 99%  Weight: 89.7 kg (197 lb 12 oz)     Height:        Inpatient medications: . amiodarone  200 mg Oral Daily  . arformoterol  15 mcg Nebulization BID  . aspirin  81 mg Oral Daily  . budesonide (PULMICORT) nebulizer solution  0.5 mg Nebulization BID  . chlorhexidine  15 mL Mouth Rinse BID  . hydrocortisone sod succinate (SOLU-CORTEF) inj  50 mg Intravenous Q6H  . insulin aspart  0-15 Units Subcutaneous Q4H  . mouth rinse  15 mL Mouth Rinse q12n4p   . sodium chloride    . sodium chloride    . sodium chloride    . ceFEPime (MAXIPIME) IV Stopped (11/28/16 1750)   sodium chloride, sodium chloride, sodium chloride, RESOURCE THICKENUP CLEAR  Exam: General: Ill appearing caucasian male Neck: Supple. JVD with mild elevation. CV: S1,S2, regularly irreg. No M/G/R Lungs: clear bilat , no wheezing Abd: distended with active BS.  Ext: no LE edema  Neuro: Alert and oriented X 3. Dialysis: Maturing RUA AVF + bruit RIJ TDC drsg CDI.   Dialysis Orders: [facility] 4h   90kg  2/2.25  Hep 2000  R IJ TDC/ maturing RUA AVF   Assessment: 1.  Sepsis - ischemic SB w pneumatosis, per CCS. Vanc/cefepime per primary.  2.  SOB/ pulm edema - resolved 3.  ESRD - recent start. TTS HD.  HD today 4.  Hypertension/volume  - at dry wt. Try to avoid hypotension given #1 5.  Anemia of CKD - HGB dropping into 8's.  Will start ESA darbe 60/wk today. Hold off on Fe load D/T sepsis.  6.  MBD of CKD - Add renal function panel to today's labs. No binders/VDRA yet.  7.  Nutrition -NPO except ice chips. Albumin 2.3 8.  Afib: continue coumadin per primary/pharmacy. 9. GOC:  Patient is DNR.   Plan - as above   Kelly Splinter MD [facility] pager [phone]   11/29/2016, 12:26 PM   Recent Labs  Lab 11/27/16 0320 11/28/16 0400 11/29/16 0746  NA 139 137 134*  K 4.6 3.8 4.0  CL 103 99* 97*  CO2 22 22 25   GLUCOSE 119* 158* 154*  BUN 46* 32* 53*  CREATININE 4.79* 3.61* 4.94*  CALCIUM 8.0* 8.0* 8.2*  PHOS 4.8*  --   --    Recent Labs  Lab 11/28/2016 1455 11/29/16 0746  AST 31 16  ALT 24 16*  ALKPHOS 97 75  BILITOT 1.6* 0.8  PROT 6.0* 5.1*  ALBUMIN 2.3* 2.1*   Recent Labs  Lab 11/19/2016 1455 11/27/16 0320 11/28/16 0400 11/29/16 0746  WBC 42.7* 29.3* 17.3* 13.2*  NEUTROABS 36.3*  --   --   --   HGB 10.6* 10.0* 9.1* 8.2*  HCT 34.1* 31.9* 29.4* 25.9*  MCV 97.7 96.7 95.5 93.8  PLT 275 228 224 189   Iron/TIBC/Ferritin/ %Sat    Component Value Date/Time   IRON 21 (L) 11/05/2016 0502   TIBC 190 (L) 11/05/2016 0502  IRONPCTSAT 11 (L) 11/05/2016 0502

## 2016-11-29 NOTE — Progress Notes (Signed)
Dr Ree Kida notified of INR 4.96

## 2016-11-29 NOTE — Progress Notes (Signed)
HD tx initiated via HD cath w/o problem by Cory Munch, RN, pull/push/flush equally w/o problem, VSS w/ soft bp, will cont to monitor while on HD tx

## 2016-11-29 NOTE — Progress Notes (Signed)
LUE venous duplex prelim: DVT noted in the left IJV and SVT in the left cephalic vein. Landry Mellow, RDMS, RVT Called results to patient's RN

## 2016-11-29 NOTE — Progress Notes (Signed)
  Speech Language Pathology Treatment: Dysphagia  Patient Details Name: DRAVON NOTT MRN: [id number] DOB: 1930-03-08 Today's Date: 11/29/2016 Time: 6203-5597 SLP Time Calculation (min) (ACUTE ONLY): 8 min  Assessment / Plan / Recommendation Clinical Impression  Per MD note pt to continue clear liquids. SLP offered honey thickened liquids of choice, pt tolerated hot coffee via teaspoon and orange juice self fed via cup without overt signs of aspiration though vocal quality remain significantly dysphonic. Pt appears stable from prior assessment, continue honey thick liquid for now. Will follow advancement of solids or change to nectar when appropriate.   HPI HPI: 83 year old male who presented to ER 11/14 with ongoing diarrhea, worsening shortness of breath, cough, fever of 103, abdominal distention and discomfort. Dialysis yesterday was not fully completed due to hypotension. Patient is on midodrine for chronic hypotension. Found to have concern for sepis (?abd vs pulm source) admitted for work up. CT abd concerning for possible early distal small bowel ischemia. C-Diff Ag + / Toxin negative. CXR concerning for bibasilar atelectasis / effusion and patchy atelectasis.  Of note, patient had hospitalization in September for SOB, found to have multivessel CAD s/p CABG 04/28/36 complicated by renal failure and pneumothorax. Patient discharged to rehab over the last month but discharged home on Friday.      SLP Plan  Continue with current plan of care       Recommendations  Diet recommendations: Honey-thick liquid Medication Administration: Crushed with puree Supervision: Patient able to self feed;Full supervision/cueing for compensatory strategies Compensations: Slow rate;Small sips/bites;Hard cough after swallow;Multiple dry swallows after each bite/sip Postural Changes and/or Swallow Maneuvers: Seated upright 90 degrees                Oral Care Recommendations: Oral care BID Follow  up Recommendations: Skilled Nursing facility SLP Visit Diagnosis: Dysphagia, pharyngeal phase (R13.13) Plan: Continue with current plan of care       GO               [facility] [place] CCC-SLP [phone]  Lynann Beaver 11/29/2016, 8:45 AM

## 2016-11-29 NOTE — Progress Notes (Signed)
Subjective/Chief Complaint: Reports intermittent RLQ pain Denies nausea Didn't take much po   Objective: Vital signs in last 24 hours: Temp:  [97.4 F (36.3 C)-98 F (36.7 C)] 97.8 F (36.6 C) (11/17 0358) Pulse Rate:  [82-111] 89 (11/17 0358) Resp:  [12-25] 15 (11/17 0358) BP: (83-138)/(55-111) 103/61 (11/17 0358) SpO2:  [96 %-100 %] 98 % (11/17 0358) Weight:  [89.7 kg (197 lb 12 oz)] 89.7 kg (197 lb 12 oz) (11/17 0624) Last BM Date: 11/28/16  Intake/Output from previous day: 11/16 0701 - 11/17 0700 In: 155 [I.V.:5; IV Piggyback:150] Out: -  Intake/Output this shift: No intake/output data recorded.  Exam: Awake and alert Abdomen soft with some RLQ tenderness and guarding  Lab Results:  Recent Labs    11/27/16 0320 11/28/16 0400  WBC 29.3* 17.3*  HGB 10.0* 9.1*  HCT 31.9* 29.4*  PLT 228 224   BMET Recent Labs    11/27/16 0320 11/28/16 0400  NA 139 137  K 4.6 3.8  CL 103 99*  CO2 22 22  GLUCOSE 119* 158*  BUN 46* 32*  CREATININE 4.79* 3.61*  CALCIUM 8.0* 8.0*   PT/INR Recent Labs    11/27/16 0320 11/28/16 0400  LABPROT 27.7* 33.8*  INR 2.61 3.36   ABG No results for input(s): PHART, HCO3 in the last 72 hours.  Invalid input(s): PCO2, PO2  Studies/Results: Dg Chest Port 1 View  Result Date: 11/28/2016 CLINICAL DATA:  83 year old male with new end-stage renal disease on hemodialysis presenting with diarrhea abdominal pain fever cough and shortness of breath. Possible sepsis. EXAM: PORTABLE CHEST 1 VIEW COMPARISON:  11/27/2016 and earlier. FINDINGS: Portable AP semi upright view at 0411 hours. Stable lung volumes. Regressed veiling opacity at the left lung base with now partial visualization of the left hemidiaphragm. Stable cardiomegaly and mediastinal contours. Stable right chest dialysis catheter. Prior CABG. Stable pulmonary vascularity. The right lung otherwise remains clear. Stable visualized osseous structures. No pneumoperitoneum  identified. IMPRESSION: 1. Partial regression of left pleural effusion and lung base opacity. 2. Stable vascular congestion without overt edema. 3. No new cardiopulmonary abnormality. Electronically Signed   By: Genevie Ann M.D.   On: 11/28/2016 07:27    Anti-infectives: Anti-infectives (From admission, onward)   Start     Dose/Rate Route Frequency Ordered Stop   11/29/16 1200  vancomycin (VANCOCIN) IVPB 1000 mg/200 mL premix  Status:  Discontinued     1,000 mg 200 mL/hr over 60 Minutes Intravenous Every T-Th-Sa (Hemodialysis) 11/28/16 0659 11/28/16 0915   11/28/16 0700  vancomycin (VANCOCIN) IVPB 1000 mg/200 mL premix     1,000 mg 200 mL/hr over 60 Minutes Intravenous STAT 11/28/16 0658 11/28/16 0900   11/27/16 1800  ceFEPIme (MAXIPIME) 1 g in dextrose 5 % 50 mL IVPB     1 g 100 mL/hr over 30 Minutes Intravenous Every 24 hours 12/06/2016 1745     11/22/2016 1700  vancomycin (VANCOCIN) 50 mg/mL oral solution 125 mg  Status:  Discontinued     125 mg Oral Every 6 hours 12/03/2016 1638 11/27/16 1019   11/25/2016 1530  vancomycin (VANCOCIN) IVPB 1000 mg/200 mL premix  Status:  Discontinued     1,000 mg 200 mL/hr over 60 Minutes Intravenous  Once 12/09/2016 1520 12/11/2016 1521   12/07/2016 1530  ceFEPIme (MAXIPIME) 2 g in dextrose 5 % 50 mL IVPB     2 g 100 mL/hr over 30 Minutes Intravenous  Once 11/23/2016 1520 11/25/2016 1702   12/06/2016 1530  vancomycin (VANCOCIN)  2,000 mg in sodium chloride 0.9 % 500 mL IVPB     2,000 mg 250 mL/hr over 120 Minutes Intravenous  Once 11/28/2016 1521 11/29/2016 1829      Assessment/Plan:  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

## 2016-11-29 NOTE — Progress Notes (Signed)
PROGRESS NOTE    [name]  WUJ:[id number] DOB: [birth year] DOA: 11/20/2016 PCP: [name], MD   Chief Complaint  Patient presents with  . Hypotension  . Diarrhea    Brief Narrative:  HPI on 12/10/2016 by Dr. Aloha Gell, PCCM 83 year old male who presented to ER 11/14 with ongoing diarrhea, worsening shortness of breath, cough, fever of 103, abdominal distention and discomfort.  Dialysis yesterday was not fully completed due to hypotension.  Patient is on midodrine for chronic hypotension. Found to have concern for sepis (?abd vs pulm source) admitted for work up.  CT abd concerning for possible early distal small bowel ischemia.  C-Diff Ag + / Toxin negative.  CXR concerning for bibasilar atelectasis / effusion and patchy atelectasis.   Of note, patient had hospitalization in September for SOB, found to have multivessel CAD s/p CABG 9/56/21 complicated by renal failure and pneumothorax.  Patient discharged to rehab over the last month but discharged home on Friday. Now ESRD on HD   Interim history Admitted for severe sepsis, likely secondary to GI source. Patient also noted to have acute respiratory failure with pleural effusion which appears to be improving. Nephrology consulted for ESRD. General surgery consulted for small bowel pneumatosis secondary to ischemia and low flow state, recommending conservative management. [facility] assumed care on 11/29/2016.  Assessment & Plan   Sepsis possibly secondary to small bowel pneumatosis/ischemia vs aspiration -Patient noted to have hypotension and volume depletion -Was initially on vancomycin and cefepime, however vanc discontinued on 11/28/2016 -CT abd/pelvis: mild small bowel distention, pneumotosis, ?early distal small bowel ishcemia -General surgery consulted and appreciated, patient not a surgical candidate, continue supportive care -continue clear liquid diet   Acute hypoxic respiratory failure/left pleural  effusion/COPD -Improving -Continue Brovana, Pulmicort, pulmonary hygiene and flutter valve -Continue dialysis for volume control -Chest x-ray: vascular congestion, L>R. Small B/L pleural effusions  Atrial fibrillation -History of MAZE September 2018 -Continue Amiodarone, aspirin -INR 4.96 -coumadin and Metoprolol held  End-stage renal disease -Nephrology consulted and appreciated -Continue hemodialysis  Anemia of chronic disease -hemoglobin currently 8.2, continue to monitor CBC  Acute metabolic encephalopathy -resolved  Hyperkalemia -Resolved, continue to monitor BMP  Coronary artery disease -Status post CABG September 2018 -Currently denies chest pain -Metoprolol held, continue aspirin  Nausea, vomiting, diarrhea -improving -C diff antigen +, however toxin negative -GI pathogen panel negative  Hypothyroidism -Continue synthroid  Diabetes mellitus -Continue ISS and CBG monitoring  Rheumatoid arthritis -continue stress dose steroisd -Orencia held since September  LUE edema -Will obtain US  Deconditioning  -PT recommended SNF  DVT Prophylaxis  SCDs  Code Status: DNR  Family Communication: None at beside, daughter via hone  Disposition Plan: Admitted. SNF at discharge  Cash surgery Nephrology  Procedures  None  Antibiotics   Anti-infectives (From admission, onward)   Start     Dose/Rate Route Frequency Ordered Stop   11/29/16 1200  vancomycin (VANCOCIN) IVPB 1000 mg/200 mL premix  Status:  Discontinued     1,000 mg 200 mL/hr over 60 Minutes Intravenous Every T-Th-Sa (Hemodialysis) 11/28/16 0659 11/28/16 0915   11/28/16 0700  vancomycin (VANCOCIN) IVPB 1000 mg/200 mL premix     1,000 mg 200 mL/hr over 60 Minutes Intravenous STAT 11/28/16 0658 11/28/16 0900   11/27/16 1800  ceFEPIme (MAXIPIME) 1 g in dextrose 5 % 50 mL IVPB     1 g 100 mL/hr over 30 Minutes Intravenous Every 24 hours 12/08/2016 1745     12/07/2016  1700   vancomycin (VANCOCIN) 50 mg/mL oral solution 125 mg  Status:  Discontinued     125 mg Oral Every 6 hours 11/18/2016 1638 11/27/16 1019   12/12/2016 1530  vancomycin (VANCOCIN) IVPB 1000 mg/200 mL premix  Status:  Discontinued     1,000 mg 200 mL/hr over 60 Minutes Intravenous  Once 11/29/2016 1520 11/21/2016 1521   12/12/2016 1530  ceFEPIme (MAXIPIME) 2 g in dextrose 5 % 50 mL IVPB     2 g 100 mL/hr over 30 Minutes Intravenous  Once 11/25/2016 1520 12/10/2016 1702   12/08/2016 1530  vancomycin (VANCOCIN) 2,000 mg in sodium chloride 0.9 % 500 mL IVPB     2,000 mg 250 mL/hr over 120 Minutes Intravenous  Once 12/06/2016 1521 12/04/2016 1829      Subjective:   [name] seen and examined today.  Denies abdominal pain, nausea, vomiting, diarrhea. Denies chest pain. Feels breathing has improved. Complains of left arm swelling.   Objective:   Vitals:   11/29/16 0358 11/29/16 0624 11/29/16 0805 11/29/16 0838  BP: 103/61   91/67  Pulse: 89  91 88  Resp: 15  (!) 30 16  Temp: 97.8 F (36.6 C)   (!) 97.5 F (36.4 C)  TempSrc: Axillary   Oral  SpO2: 98%  98% 100%  Weight:  89.7 kg (197 lb 12 oz)    Height:        Intake/Output Summary (Last 24 hours) at 11/29/2016 1201 Last data filed at 11/29/2016 0900 Gross per 24 hour  Intake 115 ml  Output -  Net 115 ml   Filed Weights   11/27/16 2242 11/28/16 0215 11/29/16 0624  Weight: 87.8 kg (193 lb 9 oz) 85.8 kg (189 lb 2.5 oz) 89.7 kg (197 lb 12 oz)    Exam  General: Well developed, chronically ill appearing, NAD  HEENT: NCAT, mucous membranes moist.   Cardiovascular: S1 S2 auscultated, no rubs, murmurs or gallops. Regular rate and rhythm.  Respiratory: Basilar crackles   Abdomen: Soft, nontender, nondistended, + bowel sounds  Extremities: warm dry without cyanosis clubbing. Trace LE edema. LUE edema  Neuro: AAOx3, nonfocal  Psych: Normal affect and demeanor with intact judgement and insight   Data Reviewed: I have personally reviewed  following labs and imaging studies  CBC: Recent Labs  Lab 11/20/2016 1455 11/27/16 0320 11/28/16 0400 11/29/16 0746  WBC 42.7* 29.3* 17.3* 13.2*  NEUTROABS 36.3*  --   --   --   HGB 10.6* 10.0* 9.1* 8.2*  HCT 34.1* 31.9* 29.4* 25.9*  MCV 97.7 96.7 95.5 93.8  PLT 275 228 224 268   Basic Metabolic Panel: Recent Labs  Lab 12/02/2016 1455 11/27/16 0320 11/28/16 0400 11/29/16 0746  NA 136 139 137 134*  K 5.2* 4.6 3.8 4.0  CL 94* 103 99* 97*  CO2 26 22 22 25   GLUCOSE 153* 119* 158* 154*  BUN 38* 46* 32* 53*  CREATININE 4.81* 4.79* 3.61* 4.94*  CALCIUM 8.5* 8.0* 8.0* 8.2*  MG  --  1.8  --   --   PHOS  --  4.8*  --   --    GFR: Estimated Creatinine Clearance: 12.1 mL/min (A) (by C-G formula based on SCr of 4.94 mg/dL (H)). Liver Function Tests: Recent Labs  Lab 11/29/2016 1455 11/29/16 0746  AST 31 16  ALT 24 16*  ALKPHOS 97 75  BILITOT 1.6* 0.8  PROT 6.0* 5.1*  ALBUMIN 2.3* 2.1*   No results for input(s): LIPASE, AMYLASE  in the last 168 hours. No results for input(s): AMMONIA in the last 168 hours. Coagulation Profile: Recent Labs  Lab 12/04/2016 1455 11/27/16 0320 11/28/16 0400 11/29/16 0746  INR 2.43 2.61 3.36 4.96*   Cardiac Enzymes: Recent Labs  Lab 11/27/2016 2306  TROPONINI 0.21*   BNP (last 3 results) No results for input(s): PROBNP in the last 8760 hours. HbA1C: No results for input(s): HGBA1C in the last 72 hours. CBG: Recent Labs  Lab 11/28/16 1617 11/28/16 2003 11/29/16 0103 11/29/16 0352 11/29/16 0833  GLUCAP 245* 264* 229* 194* 142*   Lipid Profile: No results for input(s): CHOL, HDL, LDLCALC, TRIG, CHOLHDL, LDLDIRECT in the last 72 hours. Thyroid Function Tests: No results for input(s): TSH, T4TOTAL, FREET4, T3FREE, THYROIDAB in the last 72 hours. Anemia Panel: No results for input(s): VITAMINB12, FOLATE, FERRITIN, TIBC, IRON, RETICCTPCT in the last 72 hours. Urine analysis:    Component Value Date/Time   COLORURINE YELLOW  10/06/2016 1353   APPEARANCEUR HAZY (A) 10/06/2016 1353   LABSPEC 1.009 10/06/2016 1353   PHURINE 6.0 10/06/2016 1353   GLUCOSEU NEGATIVE 10/06/2016 1353   HGBUR NEGATIVE 10/06/2016 1353   BILIRUBINUR NEGATIVE 10/06/2016 1353   KETONESUR NEGATIVE 10/06/2016 1353   PROTEINUR NEGATIVE 10/06/2016 1353   UROBILINOGEN 0.2 08/18/2014 1527   NITRITE NEGATIVE 10/06/2016 1353   LEUKOCYTESUR LARGE (A) 10/06/2016 1353   Sepsis Labs: @LABRCNTIP (procalcitonin:4,lacticidven:4)  ) Recent Results (from the past 240 hour(s))  Blood Culture (routine x 2)     Status: None (Preliminary result)   Collection Time: 12/06/2016  3:23 PM  Result Value Ref Range Status   Specimen Description BLOOD LEFT HAND  Final   Special Requests   Final    BOTTLES DRAWN AEROBIC ONLY Blood Culture adequate volume   Culture NO GROWTH 2 DAYS  Final   Report Status PENDING  Incomplete  Gastrointestinal Panel by PCR , Stool     Status: None   Collection Time: 12/01/2016  4:06 PM  Result Value Ref Range Status   Campylobacter species NOT DETECTED NOT DETECTED Final   Plesimonas shigelloides NOT DETECTED NOT DETECTED Final   Salmonella species NOT DETECTED NOT DETECTED Final   Yersinia enterocolitica NOT DETECTED NOT DETECTED Final   Vibrio species NOT DETECTED NOT DETECTED Final   Vibrio cholerae NOT DETECTED NOT DETECTED Final   Enteroaggregative E coli (EAEC) NOT DETECTED NOT DETECTED Final   Enteropathogenic E coli (EPEC) NOT DETECTED NOT DETECTED Final   Enterotoxigenic E coli (ETEC) NOT DETECTED NOT DETECTED Final   Shiga like toxin producing E coli (STEC) NOT DETECTED NOT DETECTED Final   Shigella/Enteroinvasive E coli (EIEC) NOT DETECTED NOT DETECTED Final   Cryptosporidium NOT DETECTED NOT DETECTED Final   Cyclospora cayetanensis NOT DETECTED NOT DETECTED Final   Entamoeba histolytica NOT DETECTED NOT DETECTED Final   Giardia lamblia NOT DETECTED NOT DETECTED Final   Adenovirus F40/41 NOT DETECTED NOT DETECTED  Final   Astrovirus NOT DETECTED NOT DETECTED Final   Norovirus GI/GII NOT DETECTED NOT DETECTED Final   Rotavirus A NOT DETECTED NOT DETECTED Final   Sapovirus (I, II, IV, and V) NOT DETECTED NOT DETECTED Final  C difficile quick scan w PCR reflex     Status: Abnormal   Collection Time: 11/19/2016  4:06 PM  Result Value Ref Range Status   C Diff antigen POSITIVE (A) NEGATIVE Final   C Diff toxin NEGATIVE NEGATIVE Final   C Diff interpretation Results are indeterminate. See PCR results.  Final  Clostridium Difficile by PCR     Status: None   Collection Time: 12/04/2016  4:06 PM  Result Value Ref Range Status   Toxigenic C Difficile by pcr NEGATIVE NEGATIVE Final    Comment: Patient is colonized with non toxigenic C. difficile. May not need treatment unless significant symptoms are present.  MRSA PCR Screening     Status: Abnormal   Collection Time: 11/23/2016  9:34 PM  Result Value Ref Range Status   MRSA by PCR INVALID RESULTS, SPECIMEN SENT FOR CULTURE (A) NEGATIVE Final    Comment:        The GeneXpert MRSA Assay (FDA approved for NASAL specimens only), is one component of a comprehensive MRSA colonization surveillance program. It is not intended to diagnose MRSA infection nor to guide or monitor treatment for MRSA infections.   MRSA culture     Status: None   Collection Time: 11/23/2016  9:34 PM  Result Value Ref Range Status   Specimen Description NASAL SWAB  Final   Special Requests NONE  Final   Culture NO MRSA DETECTED  Final   Report Status 11/28/2016 FINAL  Final  Culture, blood (Routine X 2) w Reflex to ID Panel     Status: None (Preliminary result)   Collection Time: 11/14/2016 11:23 PM  Result Value Ref Range Status   Specimen Description BLOOD LEFT HAND  Final   Special Requests IN PEDIATRIC BOTTLE Blood Culture adequate volume  Final   Culture NO GROWTH 1 DAY  Final   Report Status PENDING  Incomplete      Radiology Studies: Dg Chest Port 1 View  Result Date:  11/29/2016 CLINICAL DATA:  Acute respiratory failure with hypoxia EXAM: PORTABLE CHEST 1 VIEW COMPARISON:  11/28/2016 FINDINGS: Right dialysis catheter remains in place, unchanged. Prior CABG. Cardiomegaly. Vascular congestion and worsening interstitial and alveolar opacities in the lungs, left greater than right, favor asymmetric edema although infection cannot be completely excluded. Small left  has small bilateral effusions. IMPRESSION: Asymmetric airspace disease, left greater than right. Asymmetric edema versus infection. Small bilateral effusions. Electronically Signed   By: Rolm Baptise M.D.   On: 11/29/2016 08:37   Dg Chest Port 1 View  Result Date: 11/28/2016 CLINICAL DATA:  83 year old male with new end-stage renal disease on hemodialysis presenting with diarrhea abdominal pain fever cough and shortness of breath. Possible sepsis. EXAM: PORTABLE CHEST 1 VIEW COMPARISON:  11/27/2016 and earlier. FINDINGS: Portable AP semi upright view at 0411 hours. Stable lung volumes. Regressed veiling opacity at the left lung base with now partial visualization of the left hemidiaphragm. Stable cardiomegaly and mediastinal contours. Stable right chest dialysis catheter. Prior CABG. Stable pulmonary vascularity. The right lung otherwise remains clear. Stable visualized osseous structures. No pneumoperitoneum identified. IMPRESSION: 1. Partial regression of left pleural effusion and lung base opacity. 2. Stable vascular congestion without overt edema. 3. No new cardiopulmonary abnormality. Electronically Signed   By: Genevie Ann M.D.   On: 11/28/2016 07:27     Scheduled Meds: . amiodarone  200 mg Oral Daily  . arformoterol  15 mcg Nebulization BID  . aspirin  81 mg Oral Daily  . budesonide (PULMICORT) nebulizer solution  0.5 mg Nebulization BID  . chlorhexidine  15 mL Mouth Rinse BID  . hydrocortisone sod succinate (SOLU-CORTEF) inj  50 mg Intravenous Q6H  . insulin aspart  0-15 Units Subcutaneous Q4H  .  mouth rinse  15 mL Mouth Rinse q12n4p   Continuous Infusions: . sodium chloride    .  sodium chloride    . sodium chloride    . ceFEPime (MAXIPIME) IV Stopped (11/28/16 1750)     LOS: 3 days   Time Spent in minutes   45 minutes  Hasini Peachey D.O. on 11/29/2016 at 12:01 PM  Between 7am to 7pm - Pager - [phone]  After 7pm go to [email or web address] - password [email or web address]  And look for the night coverage person covering for me after hours  [facility] Office  [phone]

## 2016-11-29 NOTE — Progress Notes (Signed)
HD tx completed @ 2255 w/o problem other than bp issues at the beginning of tx that resolved after Midodrine and Albumin admin per Dr. Jonnie Finner orders, UF goal met, blood rinsed back, VSS, attempted to call report to primary nurse but she was unable to take report at the time, awaiting return call

## 2016-11-29 NOTE — Progress Notes (Addendum)
Texted message Dr Ree Kida patient in vascular with Left DVT Jugular and Left Superficial Cephalica .

## 2016-11-30 ENCOUNTER — Other Ambulatory Visit: Payer: Self-pay

## 2016-11-30 DIAGNOSIS — Z515 Encounter for palliative care: Secondary | ICD-10-CM

## 2016-11-30 DIAGNOSIS — J9601 Acute respiratory failure with hypoxia: Secondary | ICD-10-CM

## 2016-11-30 LAB — PROTIME-INR
INR: 6.67 — AB
INR: 6.13 — AB
PROTHROMBIN TIME: 54 s — AB (ref 11.4–15.2)
Prothrombin Time: 57.7 seconds — ABNORMAL HIGH (ref 11.4–15.2)

## 2016-11-30 LAB — GLUCOSE, CAPILLARY
GLUCOSE-CAPILLARY: 218 mg/dL — AB (ref 65–99)
GLUCOSE-CAPILLARY: 255 mg/dL — AB (ref 65–99)
Glucose-Capillary: 122 mg/dL — ABNORMAL HIGH (ref 65–99)
Glucose-Capillary: 102 mg/dL — ABNORMAL HIGH (ref 65–99)
Glucose-Capillary: 131 mg/dL — ABNORMAL HIGH (ref 65–99)
Glucose-Capillary: 111 mg/dL — ABNORMAL HIGH (ref 65–99)

## 2016-11-30 LAB — BASIC METABOLIC PANEL
Anion gap: 11 (ref 5–15)
BUN: 18 mg/dL (ref 6–20)
CALCIUM: 7.8 mg/dL — AB (ref 8.9–10.3)
CO2: 25 mmol/L (ref 22–32)
CREATININE: 2.5 mg/dL — AB (ref 0.61–1.24)
Chloride: 97 mmol/L — ABNORMAL LOW (ref 101–111)
GFR, EST AFRICAN AMERICAN: 26 mL/min — AB (ref >60)
GFR, EST NON AFRICAN AMERICAN: 22 mL/min — AB (ref >60)
Glucose, Bld: 149 mg/dL — ABNORMAL HIGH (ref 65–99)
Potassium: 3.3 mmol/L — ABNORMAL LOW (ref 3.5–5.1)
SODIUM: 133 mmol/L — AB (ref 135–145)

## 2016-11-30 LAB — CBC
HCT: 25 % — ABNORMAL LOW (ref 39.0–52.0)
HEMOGLOBIN: 7.9 g/dL — AB (ref 13.0–17.0)
MCH: 29.2 pg (ref 26.0–34.0)
MCHC: 31.6 g/dL (ref 30.0–36.0)
MCV: 92.3 fL (ref 78.0–100.0)
PLATELETS: 161 10*3/uL (ref 150–400)
RBC: 2.71 MIL/uL — ABNORMAL LOW (ref 4.22–5.81)
RDW: 21.3 % — ABNORMAL HIGH (ref 11.5–15.5)
WBC: 13.2 10*3/uL — ABNORMAL HIGH (ref 4.0–10.5)

## 2016-11-30 LAB — HEPARIN LEVEL (UNFRACTIONATED)
HEPARIN UNFRACTIONATED: 0.21 [IU]/mL — AB (ref 0.30–0.70)
Heparin Unfractionated: 0.24 IU/mL — ABNORMAL LOW (ref 0.30–0.70)
Heparin Unfractionated: <0.1 IU/mL — ABNORMAL LOW (ref 0.30–0.70)

## 2016-11-30 MED ORDER — POTASSIUM CHLORIDE CRYS ER 20 MEQ PO TBCR
40.0000 meq | EXTENDED_RELEASE_TABLET | Freq: Once | ORAL | Status: AC
  Administered 2016-11-30: 40 meq via ORAL
  Filled 2016-11-30: qty 2

## 2016-11-30 MED ORDER — SODIUM CHLORIDE 0.9 % IV SOLN: 100.0000 mL | INTRAVENOUS | Status: DC | PRN

## 2016-11-30 MED ORDER — DARBEPOETIN ALFA 60 MCG/0.3ML IJ SOSY
60.0000 ug | PREFILLED_SYRINGE | INTRAMUSCULAR | Status: AC
  Administered 2016-12-01: 60 ug via INTRAVENOUS
  Filled 2016-11-30: qty 0.3

## 2016-11-30 MED ORDER — VITAMIN K1 10 MG/ML IJ SOLN
1.0000 mg | Freq: Once | INTRAVENOUS | Status: AC
  Administered 2016-11-30: 1 mg via INTRAVENOUS
  Filled 2016-11-30: qty 0.1

## 2016-11-30 MED ORDER — HEPARIN SODIUM (PORCINE) 1000 UNIT/ML DIALYSIS
2000.0000 [IU] | Freq: Once | INTRAMUSCULAR | Status: AC
  Administered 2016-12-01: 2000 [IU] via INTRAVENOUS_CENTRAL

## 2016-11-30 MED ORDER — ALTEPLASE 2 MG IJ SOLR: 2.0000 mg | Freq: Once | INTRAMUSCULAR | Status: DC | PRN

## 2016-11-30 MED ORDER — LIDOCAINE-PRILOCAINE 2.5-2.5 % EX CREA: 1.0000 "application " | TOPICAL_CREAM | CUTANEOUS | Status: DC | PRN

## 2016-11-30 MED ORDER — DARBEPOETIN ALFA 60 MCG/0.3ML IJ SOSY: 60.0000 ug | PREFILLED_SYRINGE | INTRAMUSCULAR | Status: DC

## 2016-11-30 MED ORDER — HEPARIN (PORCINE) IN NACL 100-0.45 UNIT/ML-% IJ SOLN
1400.0000 [IU]/h | INTRAMUSCULAR | Status: DC
  Administered 2016-11-30 (×2): 1400 [IU]/h via INTRAVENOUS
  Administered 2016-12-01: 1300 [IU]/h via INTRAVENOUS
  Administered 2016-12-03 – 2016-12-04 (×3): 1400 [IU]/h via INTRAVENOUS
  Filled 2016-11-30 (×6): qty 250

## 2016-11-30 MED ORDER — PENTAFLUOROPROP-TETRAFLUOROETH EX AERO: 1.0000 "application " | INHALATION_SPRAY | CUTANEOUS | Status: DC | PRN

## 2016-11-30 MED ORDER — LIDOCAINE HCL (PF) 1 % IJ SOLN: 5.0000 mL | INTRAMUSCULAR | Status: DC | PRN

## 2016-11-30 MED ORDER — HEPARIN SODIUM (PORCINE) 1000 UNIT/ML DIALYSIS: 1000.0000 [IU] | INTRAMUSCULAR | Status: DC | PRN

## 2016-11-30 MED ORDER — VITAMIN K1 1 MG/0.5ML IJ SOLN: 1.0000 mg | Freq: Once | INTRAMUSCULAR | Status: DC

## 2016-11-30 MED ORDER — MIDODRINE HCL 5 MG PO TABS
20.0000 mg | ORAL_TABLET | Freq: Three times a day (TID) | ORAL | Status: DC
  Administered 2016-11-30 – 2016-12-05 (×13): 20 mg via ORAL
  Filled 2016-11-30 (×11): qty 4

## 2016-11-30 NOTE — Progress Notes (Signed)
PROGRESS NOTE    [name]  OVF:[id number] DOB: 12-Aug-1930 DOA: 11/24/2016 PCP: [name], MD   Chief Complaint  Patient presents with  . Hypotension  . Diarrhea    Brief Narrative:  HPI on 11/18/2016 by Dr. Aloha Gell, PCCM 83 year old male who presented to ER 11/14 with ongoing diarrhea, worsening shortness of breath, cough, fever of 103, abdominal distention and discomfort.  Dialysis yesterday was not fully completed due to hypotension.  Patient is on midodrine for chronic hypotension. Found to have concern for sepis (?abd vs pulm source) admitted for work up.  CT abd concerning for possible early distal small bowel ischemia.  C-Diff Ag + / Toxin negative.  CXR concerning for bibasilar atelectasis / effusion and patchy atelectasis.   Of note, patient had hospitalization in September for SOB, found to have multivessel CAD s/p CABG 8/41/66 complicated by renal failure and pneumothorax.  Patient discharged to rehab over the last month but discharged home on Friday. Now ESRD on HD   Interim history Admitted for severe sepsis, likely secondary to GI source. Patient also noted to have acute respiratory failure with pleural effusion which appears to be improving. Nephrology consulted for ESRD. General surgery consulted for small bowel pneumatosis secondary to ischemia and low flow state, recommending conservative management. [facility] assumed care on 11/29/2016.  Assessment & Plan   Sepsis possibly secondary to small bowel pneumatosis/ischemia vs aspiration -Patient noted to have hypotension and volume depletion -Was initially on vancomycin and cefepime, however vanc discontinued on 11/28/2016 -CT abd/pelvis: mild small bowel distention, pneumotosis, ?early distal small bowel ishcemia -General surgery consulted and appreciated, patient not a surgical candidate, continue supportive care -continue clear liquid diet   Acute hypoxic respiratory failure/left pleural  effusion/COPD -Improving -Continue Brovana, Pulmicort, pulmonary hygiene and flutter valve -Continue dialysis for volume control -Chest x-ray: vascular congestion, L>R. Small B/L pleural effusions  Atrial fibrillation -History of MAZE September 2018 -Continue Amiodarone, aspirin -INR 6.67 -coumadin and Metoprolol held  Supratherapeutic INR/coagulopathy -Despite not being on coumadin during this admission, INR continues to rise -? Poor nutrition vs accuracy of testing- as patient did develop a DVT in LUE -discussed with pharmacy, cannot find medications that could cause this -will give VitK 1mg  and continue to monitor -LFTs WNL -Discussed with hematology who recommended making sure that lab draw was from peripheral vein instead of IJ. (Discussed with phlebotomy, and vein stick was done.) Would also try doing a mixing study.  -Continue to monitor   End-stage renal disease -Nephrology consulted and appreciated -Continue hemodialysis  Anemia of chronic disease -hemoglobin currently 7.9, continue to monitor CBC  Acute metabolic encephalopathy -resolved  Hyperkalemia -Resolved, continue to monitor BMP  Coronary artery disease -Status post CABG September 2018 -Currently denies chest pain -Metoprolol held, continue aspirin  Nausea, vomiting, diarrhea -improving -C diff antigen +, however toxin negative -GI pathogen panel negative  Hypothyroidism -Continue synthroid  Diabetes mellitus -Continue ISS and CBG monitoring  Rheumatoid arthritis -continue stress dose steroisd -Orencia held since September  LUE DVT/edema  -LUE venous dupples: DVT left IJV and SVT in left cephalic vein -started on heparin- because despite patient's INR being high, he still developed a DVT  Deconditioning  -PT recommended SNF  Goals of care -Discussed with several family members, 2 daughters and son in law- separately on 11/17 -Given multiple current issues and patient's current state, GOC  should be discussed -Currently, he is DNR, however, patient's family concerned about overall prognosis -Palliative care consulted and  appreciated  DVT Prophylaxis  SCDs  Code Status: DNR  Family Communication: None at beside  Disposition Plan: Admitted. SNF at discharge  [place] [facility] Nephrology Palliative care  Procedures  None  Antibiotics   Anti-infectives (From admission, onward)   Start     Dose/Rate Route Frequency Ordered Stop   11/29/16 1200  vancomycin (VANCOCIN) IVPB 1000 mg/200 mL premix  Status:  Discontinued     1,000 mg 200 mL/hr over 60 Minutes Intravenous Every T-Th-Sa (Hemodialysis) 11/28/16 0659 11/28/16 0915   11/28/16 0700  vancomycin (VANCOCIN) IVPB 1000 mg/200 mL premix     1,000 mg 200 mL/hr over 60 Minutes Intravenous STAT 11/28/16 0658 11/28/16 0900   11/27/16 1800  ceFEPIme (MAXIPIME) 1 g in dextrose 5 % 50 mL IVPB     1 g 100 mL/hr over 30 Minutes Intravenous Every 24 hours 12/03/2016 1745     11/16/2016 1700  vancomycin (VANCOCIN) 50 mg/mL oral solution 125 mg  Status:  Discontinued     125 mg Oral Every 6 hours 11/17/2016 1638 11/27/16 1019   11/23/2016 1530  vancomycin (VANCOCIN) IVPB 1000 mg/200 mL premix  Status:  Discontinued     1,000 mg 200 mL/hr over 60 Minutes Intravenous  Once 11/25/2016 1520 11/14/2016 1521   12/10/2016 1530  ceFEPIme (MAXIPIME) 2 g in dextrose 5 % 50 mL IVPB     2 g 100 mL/hr over 30 Minutes Intravenous  Once 11/23/2016 1520 12/04/2016 1702   12/03/2016 1530  vancomycin (VANCOCIN) 2,000 mg in sodium chloride 0.9 % 500 mL IVPB     2,000 mg 250 mL/hr over 120 Minutes Intravenous  Once 11/18/2016 1521 11/16/2016 1829      Subjective:   [name] seen and examined today.  Has no complaints today. Feels abdominal pain has improved. Denies current nausea or vomiting.  Denies dizziness, chest pain, shortness of.   Objective:   Vitals:   11/30/16 0100 11/30/16 0422 11/30/16 0737 11/30/16 0816  BP: (!) 113/55  109/76  (!) 115/53  Pulse: 73  76 76  Resp: 11  14 17   Temp: (!) 97.4 F (36.3 C) (!) 97.3 F (36.3 C) 97.8 F (36.6 C)   TempSrc: Oral Oral Oral   SpO2: 98%  96% 99%  Weight:  84.4 kg (186 lb 1.1 oz)    Height:        Intake/Output Summary (Last 24 hours) at 11/30/2016 1043 Last data filed at 11/30/2016 0336 Gross per 24 hour  Intake 470.6 ml  Output 1006 ml  Net -535.4 ml   Filed Weights   11/29/16 1920 11/29/16 2302 11/30/16 0422  Weight: 92.4 kg (203 lb 11.3 oz) 91.4 kg (201 lb 8 oz) 84.4 kg (186 lb 1.1 oz)   Exam  General: Well developed, chronically ill appearing, NAD  HEENT: NCAT,  mucous membranes moist.   Cardiovascular: S1 S2 auscultated, no murmurs, irregular  Respiratory: Diminished breath sounds  Abdomen: Soft, nontender, nondistended, + bowel sounds  Extremities: warm dry without cyanosis clubbing. Trace LE edema. LUE edema  Neuro: AAOx3, nonfocal  Psych: pleasant, appropriate mood and affect  Data Reviewed: I have personally reviewed following labs and imaging studies  CBC: Recent Labs  Lab 11/16/2016 1455 11/27/16 0320 11/28/16 0400 11/29/16 0746 11/30/16 0229  WBC 42.7* 29.3* 17.3* 13.2* 13.2*  NEUTROABS 36.3*  --   --   --   --   HGB 10.6* 10.0* 9.1* 8.2* 7.9*  HCT 34.1* 31.9* 29.4* 25.9* 25.0*  MCV 97.7 96.7 95.5 93.8 92.3  PLT 275 228 224 189 283   Basic Metabolic Panel: Recent Labs  Lab 11/21/2016 1455 11/27/16 0320 11/28/16 0400 11/29/16 0746 11/30/16 0229  NA 136 139 137 134* 133*  K 5.2* 4.6 3.8 4.0 3.3*  CL 94* 103 99* 97* 97*  CO2 26 22 22 25 25   GLUCOSE 153* 119* 158* 154* 149*  BUN 38* 46* 32* 53* 18  CREATININE 4.81* 4.79* 3.61* 4.94* 2.50*  CALCIUM 8.5* 8.0* 8.0* 8.2* 7.8*  MG  --  1.8  --   --   --   PHOS  --  4.8*  --   --   --    GFR: Estimated Creatinine Clearance: 23.3 mL/min (A) (by C-G formula based on SCr of 2.5 mg/dL (H)). Liver Function Tests: Recent Labs  Lab 12/07/2016 1455 11/29/16 0746  AST 31 16  ALT  24 16*  ALKPHOS 97 75  BILITOT 1.6* 0.8  PROT 6.0* 5.1*  ALBUMIN 2.3* 2.1*   No results for input(s): LIPASE, AMYLASE in the last 168 hours. No results for input(s): AMMONIA in the last 168 hours. Coagulation Profile: Recent Labs  Lab 11/15/2016 1455 11/27/16 0320 11/28/16 0400 11/29/16 0746 11/30/16 0229  INR 2.43 2.61 3.36 4.96* 6.67*   Cardiac Enzymes: Recent Labs  Lab 12/04/2016 2306  TROPONINI 0.21*   BNP (last 3 results) No results for input(s): PROBNP in the last 8760 hours. HbA1C: No results for input(s): HGBA1C in the last 72 hours. CBG: Recent Labs  Lab 11/29/16 1207 11/29/16 1655 11/29/16 2346 11/30/16 0419 11/30/16 0736  GLUCAP 208* 180* 160* 122* 102*   Lipid Profile: No results for input(s): CHOL, HDL, LDLCALC, TRIG, CHOLHDL, LDLDIRECT in the last 72 hours. Thyroid Function Tests: No results for input(s): TSH, T4TOTAL, FREET4, T3FREE, THYROIDAB in the last 72 hours. Anemia Panel: No results for input(s): VITAMINB12, FOLATE, FERRITIN, TIBC, IRON, RETICCTPCT in the last 72 hours. Urine analysis:    Component Value Date/Time   COLORURINE YELLOW 10/06/2016 1353   APPEARANCEUR HAZY (A) 10/06/2016 1353   LABSPEC 1.009 10/06/2016 1353   PHURINE 6.0 10/06/2016 1353   GLUCOSEU NEGATIVE 10/06/2016 1353   HGBUR NEGATIVE 10/06/2016 1353   BILIRUBINUR NEGATIVE 10/06/2016 1353   KETONESUR NEGATIVE 10/06/2016 1353   PROTEINUR NEGATIVE 10/06/2016 1353   UROBILINOGEN 0.2 08/18/2014 1527   NITRITE NEGATIVE 10/06/2016 1353   LEUKOCYTESUR LARGE (A) 10/06/2016 1353   Sepsis Labs: @LABRCNTIP (procalcitonin:4,lacticidven:4)  ) Recent Results (from the past 240 hour(s))  Blood Culture (routine x 2)     Status: None (Preliminary result)   Collection Time: 11/25/2016  3:23 PM  Result Value Ref Range Status   Specimen Description BLOOD LEFT HAND  Final   Special Requests   Final    BOTTLES DRAWN AEROBIC ONLY Blood Culture adequate volume   Culture NO GROWTH 3 DAYS   Final   Report Status PENDING  Incomplete  Gastrointestinal Panel by PCR , Stool     Status: None   Collection Time: 12/01/2016  4:06 PM  Result Value Ref Range Status   Campylobacter species NOT DETECTED NOT DETECTED Final   Plesimonas shigelloides NOT DETECTED NOT DETECTED Final   Salmonella species NOT DETECTED NOT DETECTED Final   Yersinia enterocolitica NOT DETECTED NOT DETECTED Final   Vibrio species NOT DETECTED NOT DETECTED Final   Vibrio cholerae NOT DETECTED NOT DETECTED Final   Enteroaggregative E coli (EAEC) NOT DETECTED NOT DETECTED Final   Enteropathogenic E coli (EPEC)  NOT DETECTED NOT DETECTED Final   Enterotoxigenic E coli (ETEC) NOT DETECTED NOT DETECTED Final   Shiga like toxin producing E coli (STEC) NOT DETECTED NOT DETECTED Final   Shigella/Enteroinvasive E coli (EIEC) NOT DETECTED NOT DETECTED Final   Cryptosporidium NOT DETECTED NOT DETECTED Final   Cyclospora cayetanensis NOT DETECTED NOT DETECTED Final   Entamoeba histolytica NOT DETECTED NOT DETECTED Final   Giardia lamblia NOT DETECTED NOT DETECTED Final   Adenovirus F40/41 NOT DETECTED NOT DETECTED Final   Astrovirus NOT DETECTED NOT DETECTED Final   Norovirus GI/GII NOT DETECTED NOT DETECTED Final   Rotavirus A NOT DETECTED NOT DETECTED Final   Sapovirus (I, II, IV, and V) NOT DETECTED NOT DETECTED Final  C difficile quick scan w PCR reflex     Status: Abnormal   Collection Time: 11/24/2016  4:06 PM  Result Value Ref Range Status   C Diff antigen POSITIVE (A) NEGATIVE Final   C Diff toxin NEGATIVE NEGATIVE Final   C Diff interpretation Results are indeterminate. See PCR results.  Final  Clostridium Difficile by PCR     Status: None   Collection Time: 11/28/2016  4:06 PM  Result Value Ref Range Status   Toxigenic C Difficile by pcr NEGATIVE NEGATIVE Final    Comment: Patient is colonized with non toxigenic C. difficile. May not need treatment unless significant symptoms are present.  MRSA PCR Screening      Status: Abnormal   Collection Time: 11/16/2016  9:34 PM  Result Value Ref Range Status   MRSA by PCR INVALID RESULTS, SPECIMEN SENT FOR CULTURE (A) NEGATIVE Final    Comment:        The GeneXpert MRSA Assay (FDA approved for NASAL specimens only), is one component of a comprehensive MRSA colonization surveillance program. It is not intended to diagnose MRSA infection nor to guide or monitor treatment for MRSA infections.   MRSA culture     Status: None   Collection Time: 11/21/2016  9:34 PM  Result Value Ref Range Status   Specimen Description NASAL SWAB  Final   Special Requests NONE  Final   Culture NO MRSA DETECTED  Final   Report Status 11/28/2016 FINAL  Final  Culture, blood (Routine X 2) w Reflex to ID Panel     Status: None (Preliminary result)   Collection Time: 11/20/2016 11:23 PM  Result Value Ref Range Status   Specimen Description BLOOD LEFT HAND  Final   Special Requests IN PEDIATRIC BOTTLE Blood Culture adequate volume  Final   Culture NO GROWTH 2 DAYS  Final   Report Status PENDING  Incomplete      Radiology Studies: Dg Chest Port 1 View  Result Date: 11/29/2016 CLINICAL DATA:  Acute respiratory failure with hypoxia EXAM: PORTABLE CHEST 1 VIEW COMPARISON:  11/28/2016 FINDINGS: Right dialysis catheter remains in place, unchanged. Prior CABG. Cardiomegaly. Vascular congestion and worsening interstitial and alveolar opacities in the lungs, left greater than right, favor asymmetric edema although infection cannot be completely excluded. Small left  has small bilateral effusions. IMPRESSION: Asymmetric airspace disease, left greater than right. Asymmetric edema versus infection. Small bilateral effusions. Electronically Signed   By: Rolm Baptise M.D.   On: 11/29/2016 08:37   Dg Chest Port 1 View  Result Date: 11/28/2016 CLINICAL DATA:  83 year old male with new end-stage renal disease on hemodialysis presenting with diarrhea abdominal pain fever cough and shortness of  breath. Possible sepsis. EXAM: PORTABLE CHEST 1 VIEW COMPARISON:  11/27/2016 and earlier.  FINDINGS: Portable AP semi upright view at 0411 hours. Stable lung volumes. Regressed veiling opacity at the left lung base with now partial visualization of the left hemidiaphragm. Stable cardiomegaly and mediastinal contours. Stable right chest dialysis catheter. Prior CABG. Stable pulmonary vascularity. The right lung otherwise remains clear. Stable visualized osseous structures. No pneumoperitoneum identified. IMPRESSION: 1. Partial regression of left pleural effusion and lung base opacity. 2. Stable vascular congestion without overt edema. 3. No new cardiopulmonary abnormality. Electronically Signed   By: Genevie Ann M.D.   On: 11/28/2016 07:27     Scheduled Meds: . amiodarone  200 mg Oral Daily  . arformoterol  15 mcg Nebulization BID  . aspirin  81 mg Oral Daily  . budesonide (PULMICORT) nebulizer solution  0.5 mg Nebulization BID  . chlorhexidine  15 mL Mouth Rinse BID  . hydrocortisone sod succinate (SOLU-CORTEF) inj  50 mg Intravenous Q6H  . insulin aspart  0-15 Units Subcutaneous Q4H  . mouth rinse  15 mL Mouth Rinse q12n4p   Continuous Infusions: . sodium chloride    . ceFEPime (MAXIPIME) IV Stopped (11/29/16 2021)  . heparin Stopped (11/30/16 0425)  . phytonadione (VITAMIN K) IV       LOS: 4 days   Time Spent in minutes   45 minutes  Maddalyn Lutze D.O. on 11/30/2016 at 10:43 AM  Between 7am to 7pm - Pager - [phone]  After 7pm go to [email or web address] - password [email or web address]  And look for the night coverage person covering for me after hours  [facility] Office  [phone]

## 2016-11-30 NOTE — Progress Notes (Addendum)
  Speech Language Pathology Treatment: Dysphagia  Patient Details Name: [name] MRN: [id number] DOB: 07/01/1930 Today's Date: 11/30/2016 Time: 1450-1510 SLP Time Calculation (min) (ACUTE ONLY): 20 min  Assessment / Plan / Recommendation Clinical Impression  Pt showed no overt s/s of aspiration given trials of honey thick liquids via teaspoon or puree consistencies. Pt did have one instance of a wet vocal quality after several sips of honey thick liquids which possibly indicates penetration or aspiration. Pt requesting more variety of PO intake which has been very limited given clear liquid/ honey thick liquid diet orders. Recommend advancing diet to full liquid, continue honey thick liquid consistency so that pt can consume additional puree consistency foods; pt asking for orange sherbet and mashed potatoes. Spoke with Dr. Ree Kida who is in agreement with advancement. F/u next date to check tolerance/ consider further advancement.   HPI HPI: 83 year old male who presented to ER 11/14 with ongoing diarrhea, worsening shortness of breath, cough, fever of 103, abdominal distention and discomfort. Dialysis yesterday was not fully completed due to hypotension. Patient is on midodrine for chronic hypotension. Found to have concern for sepis (?abd vs pulm source) admitted for work up. CT abd concerning for possible early distal small bowel ischemia. C-Diff Ag + / Toxin negative. CXR concerning for bibasilar atelectasis / effusion and patchy atelectasis.  Of note, patient had hospitalization in September for SOB, found to have multivessel CAD s/p CABG 2/83/15 complicated by renal failure and pneumothorax. Patient discharged to rehab over the last month but discharged home on Friday.      SLP Plan  Continue with current plan of care       Recommendations  Diet recommendations: Honey-thick liquid;Other(comment)(full liquid) Liquids provided via: Teaspoon Medication Administration: Crushed  with puree Supervision: Patient able to self feed;Full supervision/cueing for compensatory strategies Compensations: Slow rate;Small sips/bites;Multiple dry swallows after each bite/sip Postural Changes and/or Swallow Maneuvers: Seated upright 90 degrees                Oral Care Recommendations: Oral care BID Follow up Recommendations: Skilled Nursing facility SLP Visit Diagnosis: Dysphagia, pharyngeal phase (R13.13) Plan: Continue with current plan of care       [facility], [place], [place] 11/30/2016, 3:15 PM [phone]

## 2016-11-30 NOTE — Progress Notes (Signed)
ANTICOAGULATION CONSULT NOTE - FOLLOW UP  Pharmacy Consult:  Heparin Indication: atrial fibrillation  No Known Allergies  Patient Measurements: Height: 5\' 8"  (172.7 cm) Weight: 186 lb 1.1 oz (84.4 kg) IBW/kg (Calculated) : 68.4 Heparin Dosing Weight: 87 kg  Vital Signs: Temp: 98.7 F (37.1 C) (11/18 1730) Temp Source: Oral (11/18 1730) BP: 73/42 (11/18 1730) Pulse Rate: 75 (11/18 1730)  Labs: Recent Labs    11/28/16 0400 11/29/16 0746 11/30/16 0229 11/30/16 0849 11/30/16 1859  HGB 9.1* 8.2* 7.9*  --   --   HCT 29.4* 25.9* 25.0*  --   --   PLT 224 189 161  --   --   LABPROT 33.8* 45.8* 57.7* 54.0*  --   INR 3.36 4.96* 6.67* 6.13*  --   HEPARINUNFRC  --   --  0.24* <0.10* 0.21*  CREATININE 3.61* 4.94* 2.50*  --   --     Estimated Creatinine Clearance: 23.3 mL/min (A) (by C-G formula based on SCr of 2.5 mg/dL (H)).  Assessment: 57 YOM presented to the ED with increasing weakness and diarrhea. On Coumadin PTA for history of Afib and Pharmacy consulted to dose IV heparin when INR is less than 2. Last Coumadin dose 11/13.   INR remains supratherapeutic and continues to trend up despite holding doses, last INR 6.13. CBC downtrending slightly, but no bleeding per RN.    New LUE DVT found via dopplers on 11/17 despite supratherapeutic INR - MD requesting to go ahead and initiate Heparin.  Heparin level is low at 0.21 but was stopped to give another drug. Heparin was off when lab was drawn. RN is restarting now.   Goal of Therapy:  Heparin level 0.3-0.7 units/ml Monitor platelets by anticoagulation protocol: Yes   Plan:  Continue heparin gtt at 1400 units/hr Recheck heparin level ins 8 hrs Daily heparin level, INR, and CBC Monitor for s/s bleeding   Salome Arnt, PharmD, BCPS 11/30/2016 7:53 PM

## 2016-11-30 NOTE — Progress Notes (Signed)
Have text Dr Ree Kida and Dr Hulen Skains with questions from daughter after trying to explain multiple times to daughter no changes in patient except holding Heparin until levels change. Dr Hulen Skains called with message to her very busy that no surgery will wait on speech eval to advance diet and Dr Ree Kida called with message covered up also (after returning multiple calls yesterday to family) patient the same and waiting on Heparin levels. Verbalized message to daughter with understanding. Support given to patient.

## 2016-11-30 NOTE — Progress Notes (Signed)
Texte Dr Ree Kida with notice of IV only 1 site Heparin not compatible with antibiotics. Dr Ander Purpura to stop Heparin and give and give antibiotic.

## 2016-11-30 NOTE — Progress Notes (Signed)
[facility] Progress Note    Subjective: Patient is somewhat of an enigma, but not surgically problematic at this point.  INR 6.67 without coumadin for four days.  Objective: Vital signs in last 24 hours: Temp:  [97.3 F (36.3 C)-98 F (36.7 C)] 97.8 F (36.6 C) (11/18 0737) Pulse Rate:  [73-100] 76 (11/18 0737) Resp:  [11-30] 14 (11/18 0737) BP: (77-121)/(41-76) 109/76 (11/18 0737) SpO2:  [96 %-100 %] 96 % (11/18 0737) Weight:  [84.4 kg (186 lb 1.1 oz)-92.4 kg (203 lb 11.3 oz)] 84.4 kg (186 lb 1.1 oz) (11/18 0422) Last BM Date: 11/29/16  Intake/Output from previous day: 11/17 0701 - 11/18 0700 In: 530.6 [P.O.:400; I.V.:130.6] Out: 1006  Intake/Output this shift: No intake/output data recorded.  General: No acute distress.  Lungs: clear  Abd: Distended, good bowel sounds.  Extremities: No changes  Neuro: Intact  Lab Results:  @LABLAST2 (wbc:2,hgb:2,hct:2,plt:2) BMET ) Recent Labs    11/29/16 0746 11/30/16 0229  NA 134* 133*  K 4.0 3.3*  CL 97* 97*  CO2 25 25  GLUCOSE 154* 149*  BUN 53* 18  CREATININE 4.94* 2.50*  CALCIUM 8.2* 7.8*   PT/INR Recent Labs    11/29/16 0746 11/30/16 0229  LABPROT 45.8* 57.7*  INR 4.96* 6.67*   ABG No results for input(s): PHART, HCO3 in the last 72 hours.  Invalid input(s): PCO2, PO2  Studies/Results:   Anti-infectives: Anti-infectives (From admission, onward)   Start     Dose/Rate Route Frequency Ordered Stop   11/29/16 1200  vancomycin (VANCOCIN) IVPB 1000 mg/200 mL premix  Status:  Discontinued     1,000 mg 200 mL/hr over 60 Minutes Intravenous Every T-Th-Sa (Hemodialysis) 11/28/16 0659 11/28/16 0915   11/28/16 0700  vancomycin (VANCOCIN) IVPB 1000 mg/200 mL premix     1,000 mg 200 mL/hr over 60 Minutes Intravenous STAT 11/28/16 0658 11/28/16 0900   11/27/16 1800  ceFEPIme (MAXIPIME) 1 g in dextrose 5 % 50 mL IVPB     1 g 100 mL/hr over 30 Minutes Intravenous Every 24 hours 11/25/2016 1745     12/03/2016 1700   vancomycin (VANCOCIN) 50 mg/mL oral solution 125 mg  Status:  Discontinued     125 mg Oral Every 6 hours 11/25/2016 1638 11/27/16 1019   12/09/2016 1530  vancomycin (VANCOCIN) IVPB 1000 mg/200 mL premix  Status:  Discontinued     1,000 mg 200 mL/hr over 60 Minutes Intravenous  Once 11/30/2016 1520 12/04/2016 1521   11/22/2016 1530  ceFEPIme (MAXIPIME) 2 g in dextrose 5 % 50 mL IVPB     2 g 100 mL/hr over 30 Minutes Intravenous  Once 11/15/2016 1520 11/23/2016 1702   12/01/2016 1530  vancomycin (VANCOCIN) 2,000 mg in sodium chloride 0.9 % 500 mL IVPB     2,000 mg 250 mL/hr over 120 Minutes Intravenous  Once 11/15/2016 1521 11/29/2016 1829      Assessment/Plan: s/p  Advance diet  LOS: 4 days   Kathryne Eriksson. Dahlia Bailiff, MD, FACS [phone] [facility] 11/30/2016

## 2016-11-30 NOTE — Consult Note (Signed)
Consultation Note Date: 11/30/2016   Patient Name: [name]  DOB: November 16, 1930  MRN: [id number]  Age / Sex: 83 y.o., male  PCP: [name], MD Referring Physician: Cristal Ford, DO  Reason for Consultation: Establishing goals of care and Psychosocial/spiritual support  HPI/Patient Profile: 83 y.o. male  with past medical history of CHF with ejection fraction 45-50%, COPD, hypertension, diabetes, coronary artery disease with CABG 0/07/6224 (complicated by renal failure, pneumothorax; patient discharged to rehab then home)  11/25/2016 with diarrhea, increased shortness of breath, fever 103. Patient was only home for 4 days before being readmitted on 12/01/2016.  He was admitted with sepsis, likely GI source.  CT scan concerning for early distal small bowel ischemia.  He has been undergoing hemodialysis during this hospitalization.  Last dialysis treatment was on 11/29/2016.  He required midodrine as well as albumin.  [name]  He has a supra therapeutic INR upon admission. Pt was on coumadin..  INR as of 11/30/2016 6.67.  He has been started on heparin for management of left DVT  Interior jugular, cephalic vein  He has been seen by surgical services secondary to small bowel ischemia concerns.  He has been on conservative management and has been improving.  He is not a surgical candidate secondary to all of his comorbidities.  Consult ordered for goals of care.   Clinical Assessment and Goals of Care: Met with patient, his daughter, Cheri Kearns, and his wife.  Patient reports that he has 3 daughters and 2 living sons. 1 son has been on HD for 6 years. 1 son deceased 2/2 cardiac disease. Patient is able to participate in assessment, goals of care, although he is struggling with the complexity of his healthcare condition, clinical decline.  Initial GOC focused on introduction of palliative medicine services. We also spent  a lot of time reviewing clinical condition, medical data in an attempt for family to better understand what they maybe facing.   SUMMARY OF RECOMMENDATIONS   Confirmed DNR/DNI Pt understands HD maybe permanent and is willing to go forward with HD for as long as his body will allow him to.  Pt is hopeful for some recovery but he and his family are realistic that "he won't get back to where he was" Wants to improve to go to SNF rehab in [place] to be close to home Pt would qualify for hospice in the home/facility but not as long as he is undergoing Hd. Did address this as well as hospice in general with family Palliative medicine to stay involved and assist family with ongoing [place]  Code Status/Advance Care Planning:  DNR    Symptom Management:   Pain: Patient is currently denying abdominal pain.  He is not on any pain medicine except Tylenol.  Would recommend if more aggressive pain management is needed to avoid morphine in the setting of renal failure; low-dose Dilaudid IV or Fentanyl, would be good options, or if patient is able to take oral medications, oxycodone.  Dyspnea: Continue with targeted pulmonary tx. Opioids  could help with severe dypsnea but thus far pt is improving  Palliative Prophylaxis:   Aspiration, Bowel Regimen, Delirium Protocol, Eye Care, Frequent Pain Assessment, Oral Care and Turn Reposition  Additional Recommendations (Limitations, Scope, Preferences):  Full Scope Treatment except for DNR/DNI  Psycho-social/Spiritual:   Desire for further Chaplaincy support:no Prognosis:   Unable to determine  Discharge Planning: To Be Determined      Primary Diagnoses: Present on Admission: . Severe sepsis ([place])   I have reviewed the medical record, interviewed the patient and family, and examined the patient. The following aspects are pertinent.  Past Medical History:  Diagnosis Date  . Anemia   . Asthma   . Atrial fibrillation ([place]) 12/08/2013    . CHF (congestive heart failure) ([place])   . Chronic joint pain    "from RA" (10/01/2016)  . CKD (chronic kidney disease), stage III ([place])   . Constipation    takes stool softener daily  . COPD (chronic obstructive pulmonary disease) ([place])   . Coronary artery disease   . Coronary artery disease involving native coronary artery of native heart with angina pectoris ([place]) 09/30/2016  . Dysrhythmia    a-fib  . GERD (gastroesophageal reflux disease)    takes Zantac daily   . History of blood transfusion    "related to one of my ORs"  . Hyperlipidemia    takes Pravastatin daily  . Hypertension    takes Cardizem,Proscar,and Lisinopril daily  . Hypothyroidism   . Impaired hearing    wears hearing aids  . Joint swelling    "from RA" (10/01/2016)  . Peripheral neuropathy   . Pneumonia 2017  . Rheumatoid arthritis ([place])    "all over; take orencia  q 4 wks" (10/01/2016)  . S/P CABG x 2 10/09/2016   LIMA to LAD, SVG to OM, EVH via right thigh  . S/P Maze operation for atrial fibrillation 10/09/2016   Complete bilateral atrial lesion set using bipolar radiofrequency and cryothermy ablation with clipping of LA appendage  . Type II diabetes mellitus ([place])    Social History   Socioeconomic History  . Marital status: Married    Spouse name: None  . Number of children: None  . Years of education: None  . Highest education level: None  Social Needs  . Financial resource strain: None  . Food insecurity - worry: None  . Food insecurity - inability: None  . Transportation needs - medical: None  . Transportation needs - non-medical: None  Occupational History  . None  Tobacco Use  . Smoking status: Former Smoker    Packs/day: 1.00    Years: 15.00    Pack years: 15.00    Types: Cigarettes    Last attempt to quit: 09/01/1965    Years since quitting: 51.2  . Smokeless tobacco: Never Used  . Tobacco comment: quit smoking in 1967  Substance and Sexual Activity  . Alcohol use: No     Alcohol/week: 0.0 oz    Comment: 10/01/2016  "stopped drinking in 1967; about an alcoholic when I quit"  . Drug use: No  . Sexual activity: No  Other Topics Concern  . None  Social History Narrative  . None   Family History  Problem Relation Age of Onset  . Cancer Mother        breast  . Aneurysm Father    Scheduled Meds: . amiodarone  200 mg Oral Daily  . arformoterol  15 mcg Nebulization BID  . aspirin  81  mg Oral Daily  . budesonide (PULMICORT) nebulizer solution  0.5 mg Nebulization BID  . chlorhexidine  15 mL Mouth Rinse BID  . hydrocortisone sod succinate (SOLU-CORTEF) inj  50 mg Intravenous Q6H  . insulin aspart  0-15 Units Subcutaneous Q4H  . mouth rinse  15 mL Mouth Rinse q12n4p   Continuous Infusions: . sodium chloride    . ceFEPime (MAXIPIME) IV Stopped (11/29/16 2021)  . heparin 1,400 Units/hr (11/30/16 1049)  . phytonadione (VITAMIN K) IV     PRN Meds:.sodium chloride, RESOURCE THICKENUP CLEAR Medications Prior to Admission:  Prior to Admission medications   Medication Sig Start Date End Date Taking? Authorizing Provider  abatacept Maureen Chatters) 250 MG injection Infusion due again October 2018 but with recent heart surgery, would wait until at least November 11/03/16  Yes Nani Skillern, PA-C  acetaminophen (TYLENOL) 325 MG tablet Take 2 tablets (650 mg total) by mouth every 6 (six) hours as needed for mild pain. 11/03/16  Yes Lars Pinks M, PA-C  amiodarone (PACERONE) 200 MG tablet Take 1 tablet (200 mg total) daily by mouth. 11/21/16  Yes Angiulli, Lavon Paganini, PA-C  aspirin 81 MG chewable tablet Chew 1 tablet (81 mg total) by mouth daily. 10/01/16  Yes Adrian Prows, MD  atorvastatin (LIPITOR) 40 MG tablet Take 1 tablet (40 mg total) daily at 6 PM by mouth. 11/21/16  Yes Angiulli, Lavon Paganini, PA-C  camphor-menthol [facility]) lotion Apply topically as needed for itching. 11/03/16  Yes Lars Pinks M, PA-C  diclofenac sodium (VOLTAREN) 1 % GEL Apply 2 g 4  (four) times daily topically. 11/21/16  Yes Angiulli, Lavon Paganini, PA-C  fluticasone furoate-vilanterol (BREO ELLIPTA) 100-25 MCG/INH AEPB Inhale 1 puff daily into the lungs. 11/21/16  Yes Angiulli, Lavon Paganini, PA-C  gabapentin (NEURONTIN) 300 MG capsule Take 1 capsule (300 mg total) 2 (two) times daily by mouth. 11/21/16  Yes Angiulli, Lavon Paganini, PA-C  guaiFENesin (ROBITUSSIN) 100 MG/5ML SOLN Take 20 mLs (400 mg total) every 4 (four) hours as needed by mouth for cough or to loosen phlegm. 11/21/16  Yes Angiulli, Lavon Paganini, PA-C  Hydrocortisone (GERHARDT'S BUTT CREAM) CREA Apply 1 application 2 (two) times daily topically. 11/21/16  Yes Angiulli, Lavon Paganini, PA-C  insulin detemir (LEVEMIR) 100 unit/ml SOLN Inject 0.15 mLs (15 Units total) at bedtime into the skin. 11/21/16  Yes Angiulli, Lavon Paganini, PA-C  levothyroxine (SYNTHROID, LEVOTHROID) 25 MCG tablet Take 1 tablet (25 mcg total) daily before breakfast by mouth. 11/21/16  Yes Angiulli, Lavon Paganini, PA-C  Maltodextrin-Xanthan Gum (RESOURCE THICKENUP CLEAR) POWD Take 120 g as needed by mouth (thicken liquids to nectar consistency). 11/21/16  Yes Angiulli, Lavon Paganini, PA-C  metoprolol tartrate (LOPRESSOR) 25 MG tablet Take 0.5 tablets (12.5 mg total) 2 (two) times daily by mouth. 11/21/16  Yes Angiulli, Lavon Paganini, PA-C  midodrine (PROAMATINE) 10 MG tablet Take 2 tablets (20 mg total) 3 (three) times daily with meals by mouth. 11/21/16  Yes Angiulli, Lavon Paganini, PA-C  multivitamin (RENA-VIT) TABS tablet Take 1 tablet at bedtime by mouth. 11/21/16  Yes Angiulli, Lavon Paganini, PA-C  predniSONE (DELTASONE) 5 MG tablet Take 1 tablet (5 mg total) daily with breakfast by mouth. 11/21/16  Yes Angiulli, Lavon Paganini, PA-C  pregabalin (LYRICA) 25 MG capsule Take 1 capsule (25 mg total) daily by mouth. 11/21/16  Yes Angiulli, Lavon Paganini, PA-C  ranitidine (ZANTAC) 150 MG tablet Take 1 tablet (150 mg total) daily by mouth. 11/21/16  Yes Angiulli, Lavon Paganini, PA-C  warfarin (  COUMADIN) 2.5 MG tablet Take 1  tablet (2.5 mg total) daily at 6 PM by mouth. 11/21/16  Yes Angiulli, Lavon Paganini, PA-C  feeding supplement, GLUCERNA SHAKE, (GLUCERNA SHAKE) LIQD Take 237 mLs by mouth 2 (two) times daily between meals. 11/03/16   Nani Skillern, PA-C  finasteride (PROSCAR) 5 MG tablet Take 1 tablet (5 mg total) at bedtime by mouth. Patient not taking: Reported on 11/27/2016 11/21/16   Angiulli, Lavon Paganini, PA-C  lidocaine (LIDODERM) 5 % Place 1 patch daily onto the skin. Remove & Discard patch within 12 hours or as directed by MD 11/21/16   Angiulli, Lavon Paganini, PA-C  tamsulosin (FLOMAX) 0.4 MG CAPS capsule Take 1 capsule (0.4 mg total) at bedtime by mouth. 11/21/16   Angiulli, Lavon Paganini, PA-C  traMADol (ULTRAM) 50 MG tablet Take 1 tablet (50 mg total) every 12 (twelve) hours as needed by mouth for severe pain. 11/21/16   Angiulli, Lavon Paganini, PA-C   No Known Allergies Review of Systems  Unable to perform ROS: Severe respiratory distress    Physical Exam  Constitutional: He is oriented to person, place, and time. He appears well-developed and well-nourished.  Frail, elderly man in stepdown unit.  He is alert and verbalizes feeling better.  Denies abdominal pain  Neck: Normal range of motion.  Pulmonary/Chest:  Patient short of breath at rest and with conversation.  Subjectively he is reporting that his breathing feels better  Abdominal:  Denies abdominal pain.  Bowel movement 11/30/2016  Musculoskeletal: Normal range of motion.  Neurological: He is alert and oriented to person, place, and time.  Skin: Skin is warm and dry. There is pallor.  Psychiatric: He has a normal mood and affect. His behavior is normal.  Nursing note and vitals reviewed.   Vital Signs: BP (!) 115/53   Pulse 76   Temp 97.8 F (36.6 C) (Oral)   Resp 17   Ht _0  (1.727 m)   Wt 84.4 kg (186 lb 1.1 oz)   SpO2 99%   BMI 28.29 kg/m  Pain Assessment: No/denies pain   Pain Score: 0-No pain   SpO2: SpO2: 99 % O2 Device:SpO2: 99  % O2 Flow Rate: .O2 Flow Rate (L/min): 2 L/min  IO: Intake/output summary:   Intake/Output Summary (Last 24 hours) at 11/30/2016 1124 Last data filed at 11/30/2016 0336 Gross per 24 hour  Intake 470.6 ml  Output 1006 ml  Net -535.4 ml    LBM: Last BM Date: 11/29/16 Baseline Weight: Weight: 90 kg (198 lb 6.6 oz) Most recent weight: Weight: 84.4 kg (186 lb 1.1 oz)     Palliative Assessment/Data:   Flowsheet Rows     Most Recent Value  Intake Tab  Referral Department  Hospitalist  Unit at Time of Referral  Intermediate Care Unit  Palliative Care Primary Diagnosis  Sepsis/Infectious Disease  Date Notified  11/29/16  Palliative Care Type  New Palliative care  Reason for referral  Clarify Goals of Care  Date of Admission  12/11/2016  Date first seen by Palliative Care  11/30/16  # of days Palliative referral response time  1 Day(s)  # of days IP prior to Palliative referral  3  Clinical Assessment  Palliative Performance Scale Score  30%  Pain Max last 24 hours  0  Pain Min Last 24 hours  0  Dyspnea Max Last 24 Hours  6  Dyspnea Min Last 24 hours  1  Nausea Max Last 24 Hours  0  Nausea  Min Last 24 Hours  0  Anxiety Max Last 24 Hours  0  Anxiety Min Last 24 Hours  0  Other Max Last 24 Hours  0  Psychosocial & Spiritual Assessment  Palliative Care Outcomes  Patient/Family meeting held?  Yes  Who was at the meeting?  pt, daughter, wife  Palliative Care follow-up planned  Yes, Facility      Time In: 1200 Time Out: 1330 Time Total: 90 min Greater than 50%  of this time was spent counseling and coordinating care related to the above assessment and plan. Staffed with Dr. Ree Kida  Signed by: Dory Horn, NP   Please contact Palliative Medicine Team phone at [phone] for questions and concerns.  For individual provider: See Shea Evans

## 2016-11-30 NOTE — Progress Notes (Signed)
[facility] Progress Note  Subjective: 1 liter off w HD yest.  Meeting w/ pall care team now.     Vitals:   11/30/16 0100 11/30/16 0422 11/30/16 0737 11/30/16 0816  BP: (!) 113/55  109/76 (!) 115/53  Pulse: 73  76 76  Resp: 11  14 17   Temp: (!) 97.4 F (36.3 C) (!) 97.3 F (36.3 C) 97.8 F (36.6 C)   TempSrc: Oral Oral Oral   SpO2: 98%  96% 99%  Weight:  84.4 kg (186 lb 1.1 oz)    Height:        Inpatient medications: . amiodarone  200 mg Oral Daily  . arformoterol  15 mcg Nebulization BID  . aspirin  81 mg Oral Daily  . budesonide (PULMICORT) nebulizer solution  0.5 mg Nebulization BID  . chlorhexidine  15 mL Mouth Rinse BID  . hydrocortisone sod succinate (SOLU-CORTEF) inj  50 mg Intravenous Q6H  . insulin aspart  0-15 Units Subcutaneous Q4H  . mouth rinse  15 mL Mouth Rinse q12n4p   . sodium chloride    . ceFEPime (MAXIPIME) IV Stopped (11/29/16 2021)  . heparin 1,400 Units/hr (11/30/16 1049)   sodium chloride, RESOURCE THICKENUP CLEAR  Exam: General: Ill appearing caucasian male Neck: Supple. JVD with mild elevation. CV: S1,S2, regularly irreg. No M/G/R Lungs: clear bilat , no wheezing Abd: distended with active BS.  Ext: no LE edema  Neuro: Alert and oriented X 3. Dialysis: Maturing RUA AVF + bruit RIJ TDC drsg CDI.   Dialysis Orders: [facility] 4h   90kg  2/2.25  Hep 2000  R IJ TDC/ maturing RUA AVF   Assessment: 1.  Sepsis - ischemic SB w pneumatosis, per CCS. Vanc/cefepime 2.  SOB/ pulm edema - resolved 3.  ESRD - recent start. TTS HD.  Plan HD Monday on holiday sched 4.  Hypotension, chronic - got vol down, but still dyspneic. Difficult situation as wanting to avoid hypotension (for #1). Will repeat CXR.  UF tomorrow with HD as tolerates.  Adding back home midodrine may help.  5.  Anemia of CKD - HGB dropping into 8's.  Will start ESA darbe 60/wk w/ HD Monday. Last tsat here was low, 11% on Oct 24. Will repeat and replenish as needed.   6.  MBD of CKD - No binders/VDRA yet.  7.  Nutrition -NPO except ice chips. Albumin 2.3 8.  Afib: continue coumadin per primary/pharmacy. 9. GOC: Patient is DNR.   Plan - as above   Kelly Splinter MD [facility] pager [phone]   11/30/2016, 3:26 PM   Recent Labs  Lab 11/27/16 0320 11/28/16 0400 11/29/16 0746 11/30/16 0229  NA 139 137 134* 133*  K 4.6 3.8 4.0 3.3*  CL 103 99* 97* 97*  CO2 22 22 25 25   GLUCOSE 119* 158* 154* 149*  BUN 46* 32* 53* 18  CREATININE 4.79* 3.61* 4.94* 2.50*  CALCIUM 8.0* 8.0* 8.2* 7.8*  PHOS 4.8*  --   --   --    Recent Labs  Lab 11/19/2016 1455 11/29/16 0746  AST 31 16  ALT 24 16*  ALKPHOS 97 75  BILITOT 1.6* 0.8  PROT 6.0* 5.1*  ALBUMIN 2.3* 2.1*   Recent Labs  Lab 12/02/2016 1455  11/28/16 0400 11/29/16 0746 11/30/16 0229  WBC 42.7*   < > 17.3* 13.2* 13.2*  NEUTROABS 36.3*  --   --   --   --   HGB 10.6*   < >  9.1* 8.2* 7.9*  HCT 34.1*   < > 29.4* 25.9* 25.0*  MCV 97.7   < > 95.5 93.8 92.3  PLT 275   < > 224 189 161   < > = values in this interval not displayed.   Iron/TIBC/Ferritin/ %Sat    Component Value Date/Time   IRON 21 (L) 11/05/2016 0502   TIBC 190 (L) 11/05/2016 0502   IRONPCTSAT 11 (L) 11/05/2016 0502

## 2016-11-30 NOTE — Progress Notes (Signed)
ANTICOAGULATION CONSULT NOTE - FOLLOW UP  Pharmacy Consult:  Heparin Indication: atrial fibrillation  No Known Allergies  Patient Measurements: Height: 5\' 8"  (172.7 cm) Weight: 201 lb 8 oz (91.4 kg) IBW/kg (Calculated) : 68.4 Heparin Dosing Weight: 87 kg  Vital Signs: Temp: 97.4 F (36.3 C) (11/17 2344) Temp Source: Oral (11/17 2344) BP: 121/59 (11/17 2302) Pulse Rate: 93 (11/17 2302)  Labs: Recent Labs    11/27/16 0320 11/28/16 0400 11/29/16 0746 11/30/16 0229  HGB 10.0* 9.1* 8.2* 7.9*  HCT 31.9* 29.4* 25.9* 25.0*  PLT 228 224 189 161  LABPROT 27.7* 33.8* 45.8*  --   INR 2.61 3.36 4.96*  --   HEPARINUNFRC  --   --   --  0.24*  CREATININE 4.79* 3.61* 4.94*  --     Estimated Creatinine Clearance: 12.2 mL/min (A) (by C-G formula based on SCr of 4.94 mg/dL (H)).  Assessment: 22 YOM presented to the ED with increasing weakness and diarrhea. On Coumadin PTA for history of Afib and Pharmacy consulted to dose IV heparin when INR is less than 2. Last Coumadin dose 11/13.   INR remains supratherapeutic and continues to trend up despite holding doses, last INR 4.96. CBC downtrending slightly, but no bleeding per RN.    New LUE DVT found via dopplers on 11/17 despite supratherapeutic INR - MD requesting to go ahead and initiate Heparin.  Initial heparin level subtherapeutic at 0.24 and no infusion issues per RN.   Goal of Therapy:  Heparin level 0.3-0.7 units/ml Monitor platelets by anticoagulation protocol: Yes   Plan:  Increase heparin gtt to 1400 units/hr Heparin level in 8 hrs  Daily heparin level, INR, and CBC Monitor for s/s bleeding   Lavonda Jumbo, PharmD Clinical Pharmacist 11/30/16 3:09 AM

## 2016-12-01 ENCOUNTER — Inpatient Hospital Stay (HOSPITAL_COMMUNITY): Payer: Medicare Other

## 2016-12-01 DIAGNOSIS — L8990 Pressure ulcer of unspecified site, unspecified stage: Secondary | ICD-10-CM

## 2016-12-01 LAB — COMPREHENSIVE METABOLIC PANEL
ALT: 14 U/L — AB (ref 17–63)
AST: 13 U/L — AB (ref 15–41)
Albumin: 2.4 g/dL — ABNORMAL LOW (ref 3.5–5.0)
Alkaline Phosphatase: 67 U/L (ref 38–126)
Anion gap: 14 (ref 5–15)
BUN: 35 mg/dL — AB (ref 6–20)
CHLORIDE: 97 mmol/L — AB (ref 101–111)
CO2: 23 mmol/L (ref 22–32)
CREATININE: 3.94 mg/dL — AB (ref 0.61–1.24)
Calcium: 8.4 mg/dL — ABNORMAL LOW (ref 8.9–10.3)
GFR calc Af Amer: 15 mL/min — ABNORMAL LOW (ref >60)
GFR calc non Af Amer: 13 mL/min — ABNORMAL LOW (ref >60)
Glucose, Bld: 159 mg/dL — ABNORMAL HIGH (ref 65–99)
Potassium: 3.9 mmol/L (ref 3.5–5.1)
Sodium: 134 mmol/L — ABNORMAL LOW (ref 135–145)
Total Bilirubin: 1 mg/dL (ref 0.3–1.2)
Total Protein: 5.4 g/dL — ABNORMAL LOW (ref 6.5–8.1)

## 2016-12-01 LAB — HEPARIN LEVEL (UNFRACTIONATED)
HEPARIN UNFRACTIONATED: 0.46 [IU]/mL (ref 0.30–0.70)
HEPARIN UNFRACTIONATED: 0.76 [IU]/mL — AB (ref 0.30–0.70)
Heparin Unfractionated: >2.2 IU/mL — ABNORMAL HIGH (ref 0.30–0.70)

## 2016-12-01 LAB — CULTURE, BLOOD (ROUTINE X 2)
CULTURE: NO GROWTH
SPECIAL REQUESTS: ADEQUATE

## 2016-12-01 LAB — PHOSPHORUS: PHOSPHORUS: 4.6 mg/dL (ref 2.5–4.6)

## 2016-12-01 LAB — CBC
HCT: 27.7 % — ABNORMAL LOW (ref 39.0–52.0)
Hemoglobin: 8.8 g/dL — ABNORMAL LOW (ref 13.0–17.0)
MCH: 30.1 pg (ref 26.0–34.0)
MCHC: 31.8 g/dL (ref 30.0–36.0)
MCV: 94.9 fL (ref 78.0–100.0)
PLATELETS: 216 10*3/uL (ref 150–400)
RBC: 2.92 MIL/uL — AB (ref 4.22–5.81)
RDW: 21.4 % — ABNORMAL HIGH (ref 11.5–15.5)
WBC: 10.4 10*3/uL (ref 4.0–10.5)

## 2016-12-01 LAB — GLUCOSE, CAPILLARY
GLUCOSE-CAPILLARY: 147 mg/dL — AB (ref 65–99)
GLUCOSE-CAPILLARY: 135 mg/dL — AB (ref 65–99)
Glucose-Capillary: 166 mg/dL — ABNORMAL HIGH (ref 65–99)
Glucose-Capillary: 174 mg/dL — ABNORMAL HIGH (ref 65–99)
Glucose-Capillary: 181 mg/dL — ABNORMAL HIGH (ref 65–99)

## 2016-12-01 LAB — IRON AND TIBC
Iron: 21 ug/dL — ABNORMAL LOW (ref 45–182)
Saturation Ratios: 15 % — ABNORMAL LOW (ref 17.9–39.5)
TIBC: 137 ug/dL — ABNORMAL LOW (ref 250–450)
UIBC: 116 ug/dL

## 2016-12-01 LAB — PROTIME-INR
INR: 1.26
PROTHROMBIN TIME: 15.7 s — AB (ref 11.4–15.2)

## 2016-12-01 MED ORDER — CALCITRIOL 0.25 MCG PO CAPS
0.2500 ug | ORAL_CAPSULE | Freq: Every day | ORAL | Status: DC
  Administered 2016-12-01 – 2016-12-04 (×3): 0.25 ug via ORAL
  Filled 2016-12-01 (×2): qty 1

## 2016-12-01 MED ORDER — MIDODRINE HCL 5 MG PO TABS
ORAL_TABLET | ORAL | Status: AC
  Administered 2016-12-01: 20 mg via ORAL
  Filled 2016-12-01: qty 4

## 2016-12-01 MED ORDER — CALCITRIOL 0.25 MCG PO CAPS
ORAL_CAPSULE | ORAL | Status: AC
  Administered 2016-12-01: 0.25 ug via ORAL
  Filled 2016-12-01: qty 1

## 2016-12-01 MED ORDER — WARFARIN SODIUM 5 MG PO TABS
2.5000 mg | ORAL_TABLET | Freq: Once | ORAL | Status: AC
  Administered 2016-12-01: 2.5 mg via ORAL
  Filled 2016-12-01: qty 1

## 2016-12-01 MED ORDER — SODIUM CHLORIDE 0.9 % IV SOLN
125.0000 mg | Freq: Every day | INTRAVENOUS | Status: AC
  Administered 2016-12-01 – 2016-12-05 (×5): 125 mg via INTRAVENOUS
  Filled 2016-12-01 (×8): qty 10

## 2016-12-01 MED ORDER — DARBEPOETIN ALFA 60 MCG/0.3ML IJ SOSY
PREFILLED_SYRINGE | INTRAMUSCULAR | Status: AC
  Administered 2016-12-01: 60 ug via INTRAVENOUS
  Filled 2016-12-01: qty 0.3

## 2016-12-01 MED ORDER — WARFARIN - PHARMACIST DOSING INPATIENT
Freq: Every day | Status: DC
  Administered 2016-12-01: 1
  Administered 2016-12-02: 19:00:00

## 2016-12-01 NOTE — Progress Notes (Signed)
PT Cancellation Note  Patient Details Name: [name] MRN: [id number] DOB: May 20, 1930   Cancelled Treatment:    Reason Eval/Treat Not Completed: Patient at procedure or test/unavailable(HD in am.  Will check back as able. )   [name] 12/01/2016, 11:06 AM [facility] [phone] (pager)

## 2016-12-01 NOTE — Progress Notes (Signed)
Subjective: Overall feeling better.  Having bowel movements.  ate some mashed potatoes but not very hungry.  No vomiting. Afebrile.  Heart rate 73.  Seems regular. Coumadin on hold for supratherapeutic INR.  Labs reveal hemoglobin 8.8, stable.  WBC now normal at 10.4.  Potassium 3.9.  Creatinine 3.9 for slightly elevated from yesterday. INR pending.  Was 6.13 yesterday. On Maxipime.  Heparin infusion.  Dialysis.  Objective: Vital signs in last 24 hours: Temp:  [97.4 F (36.3 C)-98.7 F (37.1 C)] 97.9 F (36.6 C) (11/19 0410) Pulse Rate:  [73-76] 73 (11/19 0500) Resp:  [12-23] 13 (11/19 0500) BP: (73-170)/(42-76) 158/55 (11/19 0500) SpO2:  [95 %-100 %] 99 % (11/19 0500) Weight:  [83.2 kg (183 lb 6.8 oz)] 83.2 kg (183 lb 6.8 oz) (11/19 0410) Last BM Date: 12/01/16  Intake/Output from previous day: No intake/output data recorded. Intake/Output this shift: No intake/output data recorded.   General: No acute distress. Lungs: clear Abd: Less Distended, good bowel sounds, nonobstructive.  Basically nontender.  No mass or hernia Extremities: No changes  Neuro: Intact     Lab Results:  Recent Labs    11/30/16 0229 12/01/16 0501  WBC 13.2* 10.4  HGB 7.9* 8.8*  HCT 25.0* 27.7*  PLT 161 216   BMET Recent Labs    11/30/16 0229 12/01/16 0501  NA 133* 134*  K 3.3* 3.9  CL 97* 97*  CO2 25 23  GLUCOSE 149* 159*  BUN 18 35*  CREATININE 2.50* 3.94*  CALCIUM 7.8* 8.4*   PT/INR Recent Labs    11/30/16 0229 11/30/16 0849  LABPROT 57.7* 54.0*  INR 6.67* 6.13*   ABG No results for input(s): PHART, HCO3 in the last 72 hours.  Invalid input(s): PCO2, PO2  Studies/Results:   Anti-infectives: Anti-infectives (From admission, onward)   Start     Dose/Rate Route Frequency Ordered Stop   11/29/16 1200  vancomycin (VANCOCIN) IVPB 1000 mg/200 mL premix  Status:  Discontinued     1,000 mg 200 mL/hr over 60 Minutes Intravenous Every T-Th-Sa (Hemodialysis) 11/28/16  0659 11/28/16 0915   11/28/16 0700  vancomycin (VANCOCIN) IVPB 1000 mg/200 mL premix     1,000 mg 200 mL/hr over 60 Minutes Intravenous STAT 11/28/16 0658 11/28/16 0900   11/27/16 1800  ceFEPIme (MAXIPIME) 1 g in dextrose 5 % 50 mL IVPB     1 g 100 mL/hr over 30 Minutes Intravenous Every 24 hours 12/04/2016 1745     11/18/2016 1700  vancomycin (VANCOCIN) 50 mg/mL oral solution 125 mg  Status:  Discontinued     125 mg Oral Every 6 hours 11/14/2016 1638 11/27/16 1019   11/17/2016 1530  vancomycin (VANCOCIN) IVPB 1000 mg/200 mL premix  Status:  Discontinued     1,000 mg 200 mL/hr over 60 Minutes Intravenous  Once 12/04/2016 1520 11/25/2016 1521   12/02/2016 1530  ceFEPIme (MAXIPIME) 2 g in dextrose 5 % 50 mL IVPB     2 g 100 mL/hr over 30 Minutes Intravenous  Once 12/08/2016 1520 11/15/2016 1702   12/02/2016 1530  vancomycin (VANCOCIN) 2,000 mg in sodium chloride 0.9 % 500 mL IVPB     2,000 mg 250 mL/hr over 120 Minutes Intravenous  Once 11/23/2016 1521 12/04/2016 1829      Assessment/Plan:  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 failure -Improving  Atrial fibrillation -History MA ZE September 2018 -Supra therapeutic INR.  Coumadin on hold.  Not well explained -Heparin drip for now  ESRD -HD Diabetes mellitus Deconditioning.  Likely SNF DVT, left upper extremity. Rheumatoid arthritis -On steroids... This obviously complicates bedside clinical evaluation of abdominal pain.     LOS: 5 days    Adin Hector 12/01/2016

## 2016-12-01 NOTE — Care Management Important Message (Signed)
Important Message  Patient Details  Name: [name] MRN: [id number] Date of Birth: August 26, 1930   Medicare Important Message Given:  Yes    Nathen May 12/01/2016, 9:51 AM

## 2016-12-01 NOTE — NC FL2 (Signed)
[place] LEVEL OF CARE SCREENING TOOL     IDENTIFICATION  Patient Name: [name] Birthdate: May 10, 1930 Sex: male Admission Date (Current Location): 12/01/2016  [facility] [place] Number:  [employer] and Address:  [facility], [place] [address], [place], [place] [address]      Provider Number: [id number]  Attending Physician Name and Address:  Cristal Ford, DO  Relative Name and Phone Number:       Current Level of Care: Hospital Recommended Level of Care: [place] Prior Approval Number:    Date Approved/Denied:   PASRR Number: [id number]  Discharge Plan: SNF    Current Diagnoses: Patient Active Problem List   Diagnosis Date Noted  . Pressure injury of skin 12/01/2016  . Palliative care by specialist   . Severe sepsis ([place]) 11/27/2016  . Septic shock ([place])   . Diarrhea of presumed infectious origin   . Small bowel ischemia ([place])   . Chronic anticoagulation   . Dependence on renal dialysis ([place])   . Fungal rash of trunk   . Right forearm pain   . Finger pain, right   . Primary osteoarthritis of right elbow   . Hemodialysis-associated hypotension   . Labile blood glucose   . Shortness of breath   . Type 2 diabetes mellitus with peripheral neuropathy (HCC)   . PAF (paroxysmal atrial fibrillation) ([place])   . Debility 11/03/2016  . Coronary artery disease involving native heart without angina pectoris   . AKI (acute kidney injury) ([place])   . Dysphagia   . Chronic diastolic heart failure ([place])   . Benign prostatic hyperplasia   . Hx of CABG   . S/P CABG (coronary artery bypass graft)   . Acute blood loss anemia   . Anemia of chronic disease   . Chronic obstructive pulmonary disease ([place])   . Cardiogenic shock ([place])   . Spontaneous pneumothorax   . Acute respiratory failure with hypoxia ([place])   . Acute renal failure ([place])   . S/P CABG x 2 + maze procedure 10/09/2016  . S/P Maze operation  for atrial fibrillation 10/09/2016  . Coronary artery disease involving native coronary artery of native heart with angina pectoris ([place]) 09/30/2016  . Fusarium infection ([place]) 01/10/2016  . Dyspnea on exertion 09/12/2015  . Chronic systolic CHF (congestive heart failure) ([place])   . Chronic kidney disease (CKD), stage IV (severe) ([place])   . Cellulitis of left lower extremity   . Pulmonary hypertension ([place])   . Paroxysmal atrial fibrillation (HCC)   . Other emphysema ([place])   . Bronchiectasis without complication ([place])   . Other specified hypothyroidism   . CKD (chronic kidney disease) stage 4, GFR 15-29 ml/min (HCC) 08/18/2014  . Acute on chronic systolic heart failure ([place]) 08/18/2014  . Bronchiectasis ([place]) 08/18/2014  . Hypothyroidism 08/18/2014  . Bronchiectasis without acute exacerbation ([place]) 12/29/2013  . LVH (left ventricular hypertrophy) due to hypertensive disease 12/10/2013  . Atrial fibrillation ([place]) 12/08/2013  . CHF (congestive heart failure) ([place]) 12/07/2013  . Chronic obstructive airway disease with asthma ([place]) 12/07/2013  . Thyroid goiter 02/01/2013  . Low TSH level 05/18/2012  . Right thyroid nodule 05/18/2012  . Diabetes ([place]) 05/18/2012  . HLD (hyperlipidemia) 05/18/2012  . Rheumatoid arthritis ([place]) 05/18/2012  . Essential hypertension 05/18/2012  . GERD (gastroesophageal reflux disease) 05/18/2012  . Anemia 05/18/2012  . Knee pain 02/12/2012    Orientation RESPIRATION BLADDER Height & Weight  Self, Time, Situation, Place  O2(2L [place]) Continent Weight: 183 lb 6.8 oz (83.2 kg) Height:  5\' 8"  (172.7 cm)  BEHAVIORAL SYMPTOMS/MOOD NEUROLOGICAL BOWEL NUTRITION STATUS      Incontinent Diet(see DC summary)  AMBULATORY STATUS COMMUNICATION OF NEEDS Skin   Extensive Assist Verbally PU Stage and Appropriate Care   PU Stage 2 Dressing: (on buttocks- foam dressing change)                   Personal Care Assistance Level of Assistance  Bathing, Dressing  Bathing Assistance: Maximum assistance   Dressing Assistance: Maximum assistance     Functional Limitations Info             SPECIAL CARE FACTORS FREQUENCY  PT (By licensed PT), OT (By licensed OT)     PT Frequency: 5/wk OT Frequency: 5/wk            Contractures      Additional Factors Info  Code Status, Allergies, Insulin Sliding Scale Code Status Info: DNR Allergies Info: NKA   Insulin Sliding Scale Info: 6/day       Current Medications (12/01/2016):  This is the current hospital active medication list Current Facility-Administered Medications  Medication Dose Route Frequency Provider Last Rate Last Dose  . 0.9 %  sodium chloride infusion  250 mL Intravenous PRN Desai, Rahul P, PA-C      . 0.9 %  sodium chloride infusion  100 mL Intravenous PRN Roney Jaffe, MD      . 0.9 %  sodium chloride infusion  100 mL Intravenous PRN Roney Jaffe, MD      . alteplase (CATHFLO ACTIVASE) injection 2 mg  2 mg Intracatheter Once PRN Roney Jaffe, MD      . amiodarone (PACERONE) tablet 200 mg  200 mg Oral Daily Ollis, Brandi L, NP   200 mg at 12/01/16 0909  . arformoterol (BROVANA) nebulizer solution 15 mcg  15 mcg Nebulization BID Shearon Stalls, Rahul P, PA-C   15 mcg at 12/01/16 0801  . aspirin chewable tablet 81 mg  81 mg Oral Daily Ollis, Brandi L, NP   81 mg at 12/01/16 0909  . budesonide (PULMICORT) nebulizer solution 0.5 mg  0.5 mg Nebulization BID Shearon Stalls, Rahul P, PA-C   0.5 mg at 12/01/16 0806  . calcitRIOL (ROCALTROL) capsule 0.25 mcg  0.25 mcg Oral Daily Corliss Parish, MD      . ceFEPIme (MAXIPIME) 1 g in dextrose 5 % 50 mL IVPB  1 g Intravenous Q24H Rumbarger, Valeda Malm, RPH   Stopped at 11/29/16 2021  . chlorhexidine (PERIDEX) 0.12 % solution 15 mL  15 mL Mouth Rinse BID Chesley Mires, MD   15 mL at 12/01/16 0909  . Darbepoetin Alfa (ARANESP) injection 60 mcg  60 mcg Intravenous Q Mon-HD Roney Jaffe, MD      . Derrill Memo ON 12/09/2016] Darbepoetin Alfa (ARANESP)  injection 60 mcg  60 mcg Intravenous Q Tue-HD Roney Jaffe, MD      . ferric gluconate (NULECIT) 125 mg in sodium chloride 0.9 % 100 mL IVPB  125 mg Intravenous Daily Corliss Parish, MD      . heparin ADULT infusion 100 units/mL (25000 units/252mL sodium chloride 0.45%)  1,400 Units/hr Intravenous Continuous Jani Gravel, MD 14 mL/hr at 11/30/16 1922 1,400 Units/hr at 11/30/16 1922  . heparin injection 1,000 Units  1,000 Units Dialysis PRN Roney Jaffe, MD      . heparin injection 2,000 Units  2,000 Units Dialysis Once in dialysis [place],  Herbie [place], MD      . hydrocortisone sodium succinate (SOLU-CORTEF) 100 MG injection 50 mg  50 mg Intravenous Q6H Desai, Rahul P, PA-C   50 mg at 12/01/16 0856  . insulin aspart (novoLOG) injection 0-15 Units  0-15 Units Subcutaneous Q4H Javier Glazier, MD   2 Units at 12/01/16 0500  . lidocaine (PF) (XYLOCAINE) 1 % injection 5 mL  5 mL Intradermal PRN Roney Jaffe, MD      . lidocaine-prilocaine (EMLA) cream 1 application  1 application Topical PRN Roney Jaffe, MD      . MEDLINE mouth rinse  15 mL Mouth Rinse q12n4p Chesley Mires, MD   Stopped at 11/30/16 1724  . midodrine (PROAMATINE) tablet 20 mg  20 mg Oral TID WC Roney Jaffe, MD   20 mg at 12/01/16 0856  . pentafluoroprop-tetrafluoroeth (GEBAUERS) aerosol 1 application  1 application Topical PRN Roney Jaffe, MD      . RESOURCE THICKENUP CLEAR   Oral PRN Chesley Mires, MD         Discharge Medications: Please see discharge summary for a list of discharge medications.  Relevant Imaging Results:  Relevant Lab Results:   Additional Information SS#: [id number]; dialysis TTS at [facility]  Jorge Ny,

## 2016-12-01 NOTE — Progress Notes (Signed)
ANTICOAGULATION CONSULT NOTE - FOLLOW UP  Pharmacy Consult:  Heparin/warfarin Indication: atrial fibrillation/ new LUE DVT  No Known Allergies  Patient Measurements: Height: 5\' 8"  (172.7 cm) Weight: 199 lb 4.7 oz (90.4 kg) IBW/kg (Calculated) : 68.4 Heparin Dosing Weight: 87 kg  Vital Signs: Temp: 97.7 F (36.5 C) (11/19 1559) Temp Source: Oral (11/19 1559) BP: 139/51 (11/19 1600) Pulse Rate: 77 (11/19 1600)  Labs: Recent Labs    11/29/16 0746  11/30/16 0229 11/30/16 0849  12/01/16 0501 12/01/16 0816 12/01/16 0930 12/01/16 1505  HGB 8.2*  --  7.9*  --   --  8.8*  --   --   --   HCT 25.9*  --  25.0*  --   --  27.7*  --   --   --   PLT 189  --  161  --   --  216  --   --   --   LABPROT 45.8*  --  57.7* 54.0*  --   --  15.7*  --   --   INR 4.96*  --  6.67* 6.13*  --   --  1.26  --   --   HEPARINUNFRC  --    < > 0.24* <0.10*   < > 0.46  --  >2.20* 0.76*  CREATININE 4.94*  --  2.50*  --   --  3.94*  --   --   --    < > = values in this interval not displayed.    Estimated Creatinine Clearance: 15.2 mL/min (A) (by C-G formula based on SCr of 3.94 mg/dL (H)).  Assessment: 70 YOM presented to the ED with increasing weakness and diarrhea. On Coumadin PTA for history of Afib and Pharmacy consulted to dose IV heparin when INR is less than 2. Last Coumadin dose 11/13.   INR was supratherapeutic 6.13 > now 1.2 s/p Vit K 1mg  IV  x1. CBC downtrending slightly, but no bleeding per RN.    New LUE DVT found via dopplers on 11/17 despite supratherapeutic INR - MD requesting to go ahead and initiate Heparin.  Heparin level is at the top of goal at 0.7.  Will back down drip rate slightly.  Also restart warfarin tonight.  PTA warfarin 2.5mg  daily   Goal of Therapy:  Heparin level 0.3-0.7 units/ml  INR 2-3 Monitor platelets by anticoagulation protocol: Yes   Plan:  Decrease heparin gtt 1300 uts/hr Warfarin 2.5mg  x1 Daily heparin level, INR, and CBC Monitor for s/s bleeding    Bonnita Nasuti Pharm.D. CPP, BCPS Clinical Pharmacist [phone] 12/01/2016 4:46 PM

## 2016-12-01 NOTE — Progress Notes (Signed)
Physical Therapy Treatment Patient Details Name: [name] MRN: [id number] DOB: 1930-07-28 Today's Date: 12/01/2016    History of Present Illness 83 year old male who presented to ER 11/14 with ongoing diarrhea, worsening shortness of breath, cough, fever of 103, abdominal distention and discomfort.  Dialysis yesterday was not fully completed due to hypotension.  Patient is on midodrine for chronic hypotension. Found to have concern for sepis (?abd vs pulm source) admitted for work up.  CT abd concerning for possible early distal small bowel ischemia.  C-Diff Ag + / Toxin negative.  CXR concerning for bibasilar atelectasis / effusion and patchy atelectasis.  Of note, patient had hospitalization in September for SOB, found to have multivessel CAD s/p CABG 0/76/22 complicated by renal failure and pneumothorax.  Patient discharged to rehab over the last month but discharged home on Friday. PMHx: hypertension, chronic diastolic congestive heart failure, stage IV chronic kidney disease, hyperlipidemia, recurrent PAF on long-term anticoagulation, bronchiectasis with chronic dyspnea on exertion, COPD, asthma, RA, DM, and anemia     PT Comments    Pt admitted with above diagnosis. Pt currently with functional limitations due to balance and endurance deficits. Pt was able to take a few steps to chair.  Very weak after dialysis.  Will continue PT.  Pt will benefit from skilled PT to increase their independence and safety with mobility to allow discharge to the venue listed below.     Follow Up Recommendations  SNF;Supervision/Assistance - 24 hour     Equipment Recommendations  Wheelchair (measurements PT);Wheelchair cushion (measurements PT)    Recommendations for Other Services       Precautions / Restrictions Precautions Precautions: Fall;Sternal Precaution Comments: recent CABG  Restrictions Weight Bearing Restrictions: No RUE Weight Bearing: Weight bearing as tolerated    Mobility  Bed  Mobility Overal bed mobility: Needs Assistance Bed Mobility: Rolling;Sidelying to Sit Rolling: Min assist Sidelying to sit: Mod assist       General bed mobility comments: Needed incr assist for elevation of trunk.   Transfers Overall transfer level: Needs assistance Equipment used: Rolling walker (2 wheeled) Transfers: Sit to/from [facility] Sit to Stand: Min assist;+2 physical assistance;From elevated surface         General transfer comment: Bil UE support needed with c/o knee buckling  Ambulation/Gait Ambulation/Gait assistance: Min assist;+2 physical assistance Ambulation Distance (Feet): 5 Feet Assistive device: Rolling walker (2 wheeled) Gait Pattern/deviations: Step-through pattern;Decreased stride length;Trunk flexed Gait velocity: decreased Gait velocity interpretation: Below normal speed for age/gender General Gait Details: Pt was able to ambulate with RW with good safety overall.  Felt like his LEs were weak.  Steadying assist needed.     Stairs            Wheelchair Mobility    Modified Rankin (Stroke Patients Only)       Balance Overall balance assessment: Needs assistance Sitting-balance support: No upper extremity supported;Feet supported Sitting balance-Leahy Scale: Poor Sitting balance - Comments: slight posterior lean needing min assist to sit EOB. Postural control: Posterior lean Standing balance support: Bilateral upper extremity supported;During functional activity Standing balance-Leahy Scale: Poor Standing balance comment: relies on bil UE support.                             Cognition Arousal/Alertness: Awake/alert Behavior During Therapy: WFL for tasks assessed/performed Overall Cognitive Status: Within Functional Limits for tasks assessed  Exercises General Exercises - Lower Extremity Ankle Circles/Pumps: AROM;Both;10 reps;Supine Long Arc Quad:  AROM;Both;10 reps;Seated Hip Flexion/Marching: AROM;Both;10 reps;Seated    General Comments        Pertinent Vitals/Pain Pain Assessment: No/denies pain  VSS  Home Living                      Prior Function            PT Goals (current goals can now be found in the care plan section) Acute Rehab PT Goals Patient Stated Goal: to go home Progress towards PT goals: Progressing toward goals    Frequency    Min 2X/week      PT Plan Current plan remains appropriate    Co-evaluation              AM-PAC PT "6 Clicks" Daily Activity  Outcome Measure  Difficulty turning over in bed (including adjusting bedclothes, sheets and blankets)?: Unable Difficulty moving from lying on back to sitting on the side of the bed? : Unable Difficulty sitting down on and standing up from a chair with arms (e.g., wheelchair, bedside commode, etc,.)?: Unable Help needed moving to and from a bed to chair (including a wheelchair)?: A Lot Help needed walking in hospital room?: Total Help needed climbing 3-5 steps with a railing? : Total 6 Click Score: 7    End of Session Equipment Utilized During Treatment: Gait belt;Oxygen Activity Tolerance: Patient limited by fatigue Patient left: in chair;with call bell/phone within reach;with family/visitor present Nurse Communication: Mobility status PT Visit Diagnosis: Unsteadiness on feet (R26.81);Muscle weakness (generalized) (M62.81)     Time: 2542-7062 PT Time Calculation (min) (ACUTE ONLY): 13 min  Charges:  $Therapeutic Activity: 8-22 mins                    G Codes:       Josie Mesa,PT Acute Rehabilitation [phone] (pager)    Denice Paradise 12/01/2016, 3:21 PM

## 2016-12-01 NOTE — Progress Notes (Signed)
Patient was seen on dialysis and the procedure was supervised.  BFR 400  Via PC BP is  151/62.   Patient appears to be tolerating treatment well  [name] A 12/01/2016

## 2016-12-01 NOTE — Progress Notes (Signed)
PROGRESS NOTE    [name]  VFI:[id number] DOB: Aug 26, 1930 DOA: 12/04/2016 PCP: [name], MD   Chief Complaint  Patient presents with  . Hypotension  . Diarrhea    Brief Narrative:  HPI on 11/17/2016 by Dr. Aloha Gell, PCCM 83 year old male who presented to ER 11/14 with ongoing diarrhea, worsening shortness of breath, cough, fever of 103, abdominal distention and discomfort.  Dialysis yesterday was not fully completed due to hypotension.  Patient is on midodrine for chronic hypotension. Found to have concern for sepis (?abd vs pulm source) admitted for work up.  CT abd concerning for possible early distal small bowel ischemia.  C-Diff Ag + / Toxin negative.  CXR concerning for bibasilar atelectasis / effusion and patchy atelectasis.   Of note, patient had hospitalization in September for SOB, found to have multivessel CAD s/p CABG 04/29/58 complicated by renal failure and pneumothorax.  Patient discharged to rehab over the last month but discharged home on Friday. Now ESRD on HD   Interim history Admitted for severe sepsis, likely secondary to GI source. Patient also noted to have acute respiratory failure with pleural effusion which appears to be improving. Nephrology consulted for ESRD. General surgery consulted for small bowel pneumatosis secondary to ischemia and low flow state, recommending conservative management. [facility] assumed care on 11/29/2016.  Assessment & Plan   Sepsis possibly secondary to small bowel pneumatosis/ischemia vs aspiration Resolving -Patient noted to have hypotension and volume depletion -Was initially on vancomycin and cefepime, however vanc discontinued on 11/28/2016 -CT abd/pelvis: mild small bowel distention, pneumotosis, ?early distal small bowel ishcemia -General surgery consulted and appreciated, patient not a surgical candidate, continue supportive care -continue clear liquid diet   Acute hypoxic respiratory failure/left pleural  effusion/COPD -Improving -Continue Brovana, Pulmicort, pulmonary hygiene -Continue dialysis for volume control -Chest x-ray: vascular congestion, L>R. Small B/L pleural effusions -O2 prn  Atrial fibrillation Rate controlled -History of MAZE September 2018 -Continue Amiodarone, aspirin -INR 6.67, repeat 1.26 -Plan to re-start coumadin, Metoprolol held  Supratherapeutic INR/coagulopathy Likely falsely elevated as repeat is 1.26 after 1mg  of Vit K only -Despite not being on coumadin during this admission -? Accuracy of testing- as patient did develop a DVT in LUE -LFTs WNL -Discussed with hematology who recommended making sure that lab draw was from peripheral vein instead of IJ. (Discussed with phlebotomy, and vein stick was done.) -Mixing study pending -Daily INR  End-stage renal disease -Nephrology consulted and appreciated -Continue hemodialysis  Anemia of chronic disease -hemoglobin stable, continue to monitor CBC  Acute metabolic encephalopathy -resolved  Hyperkalemia -Resolved, continue to monitor BMP  Coronary artery disease -Status post CABG September 2018 -Currently denies chest pain -Metoprolol held, continue aspirin  Nausea, vomiting, diarrhea -improving -C diff antigen +, however toxin negative -GI pathogen panel negative  Hypothyroidism -Continue synthroid  Diabetes mellitus -Continue ISS and CBG monitoring  Rheumatoid arthritis -continue stress dose steroisd -Orencia held since September  LUE DVT/edema  -LUE venous dupples: DVT left IJV and SVT in left cephalic vein -started on heparin, would re-start coumadin  Deconditioning  -PT recommended SNF  Goals of care -Discussed with several family members, 2 daughters and son in law- separately on 11/17 -Given multiple current issues and patient's current state, GOC should be discussed -Currently, he is DNR, however, patient's family concerned about overall prognosis -Palliative care consulted  and appreciated -Palliative care on board  DVT Prophylaxis  Heparin  Code Status: DNR  Family Communication: 2 daughters, wife at bedside,  all questions answered.  Disposition Plan: Admitted. SNF at discharge  [facility] Nephrology Palliative care  Procedures  None  Antibiotics   Anti-infectives (From admission, onward)   Start     Dose/Rate Route Frequency Ordered Stop   11/29/16 1200  vancomycin (VANCOCIN) IVPB 1000 mg/200 mL premix  Status:  Discontinued     1,000 mg 200 mL/hr over 60 Minutes Intravenous Every T-Th-Sa (Hemodialysis) 11/28/16 0659 11/28/16 0915   11/28/16 0700  vancomycin (VANCOCIN) IVPB 1000 mg/200 mL premix     1,000 mg 200 mL/hr over 60 Minutes Intravenous STAT 11/28/16 0658 11/28/16 0900   11/27/16 1800  ceFEPIme (MAXIPIME) 1 g in dextrose 5 % 50 mL IVPB     1 g 100 mL/hr over 30 Minutes Intravenous Every 24 hours 12/01/2016 1745     12/11/2016 1700  vancomycin (VANCOCIN) 50 mg/mL oral solution 125 mg  Status:  Discontinued     125 mg Oral Every 6 hours 12/01/2016 1638 11/27/16 1019   11/20/2016 1530  vancomycin (VANCOCIN) IVPB 1000 mg/200 mL premix  Status:  Discontinued     1,000 mg 200 mL/hr over 60 Minutes Intravenous  Once 11/22/2016 1520 11/28/2016 1521   11/16/2016 1530  ceFEPIme (MAXIPIME) 2 g in dextrose 5 % 50 mL IVPB     2 g 100 mL/hr over 30 Minutes Intravenous  Once 12/08/2016 1520 12/03/2016 1702   11/22/2016 1530  vancomycin (VANCOCIN) 2,000 mg in sodium chloride 0.9 % 500 mL IVPB     2,000 mg 250 mL/hr over 120 Minutes Intravenous  Once 11/23/2016 1521 12/02/2016 1829      Subjective:   [name] seen and examined today.  Has no complaints today. Feels abdominal pain has improved. Denies current nausea or vomiting.  Denies dizziness, chest pain, shortness of breath.   Objective:   Vitals:   12/01/16 1200 12/01/16 1230 12/01/16 1409 12/01/16 1500  BP: (!) 143/71 (!) 145/69 (!) 159/54 (!) 156/62  Pulse: 78 77 77 78  Resp: 17  18 19 18   Temp:   (!) 97.3 F (36.3 C)   TempSrc:   Oral   SpO2: 99% 100% 98% 96%  Weight:      Height:        Intake/Output Summary (Last 24 hours) at 12/01/2016 1550 Last data filed at 12/01/2016 1500 Gross per 24 hour  Intake 604.57 ml  Output -  Net 604.57 ml   Filed Weights   11/30/16 0422 12/01/16 0410 12/01/16 0910  Weight: 84.4 kg (186 lb 1.1 oz) 83.2 kg (183 lb 6.8 oz) 90.4 kg (199 lb 4.7 oz)   Exam  General: Well developed, chronically ill appearing, NAD  HEENT: NCAT,  mucous membranes moist.   Cardiovascular: S1 S2 auscultated, no murmurs, irregular  Respiratory: Diminished breath sounds  Abdomen: Soft, nontender, nondistended, + bowel sounds  Extremities: warm dry without cyanosis clubbing. Trace LE edema  Neuro: AAOx3, nonfocal  Psych: pleasant, appropriate mood and affect  Data Reviewed: I have personally reviewed following labs and imaging studies  CBC: Recent Labs  Lab 12/03/2016 1455 11/27/16 0320 11/28/16 0400 11/29/16 0746 11/30/16 0229 12/01/16 0501  WBC 42.7* 29.3* 17.3* 13.2* 13.2* 10.4  NEUTROABS 36.3*  --   --   --   --   --   HGB 10.6* 10.0* 9.1* 8.2* 7.9* 8.8*  HCT 34.1* 31.9* 29.4* 25.9* 25.0* 27.7*  MCV 97.7 96.7 95.5 93.8 92.3 94.9  PLT 275 228 224 189 161 216  Basic Metabolic Panel: Recent Labs  Lab 11/27/16 0320 11/28/16 0400 11/29/16 0746 11/30/16 0229 12/01/16 0501  NA 139 137 134* 133* 134*  K 4.6 3.8 4.0 3.3* 3.9  CL 103 99* 97* 97* 97*  CO2 22 22 25 25 23   GLUCOSE 119* 158* 154* 149* 159*  BUN 46* 32* 53* 18 35*  CREATININE 4.79* 3.61* 4.94* 2.50* 3.94*  CALCIUM 8.0* 8.0* 8.2* 7.8* 8.4*  MG 1.8  --   --   --   --   PHOS 4.8*  --   --   --  4.6   GFR: Estimated Creatinine Clearance: 15.2 mL/min (A) (by C-G formula based on SCr of 3.94 mg/dL (H)). Liver Function Tests: Recent Labs  Lab 12/12/2016 1455 11/29/16 0746 12/01/16 0501  AST 31 16 13*  ALT 24 16* 14*  ALKPHOS 97 75 67  BILITOT 1.6* 0.8 1.0    PROT 6.0* 5.1* 5.4*  ALBUMIN 2.3* 2.1* 2.4*   No results for input(s): LIPASE, AMYLASE in the last 168 hours. No results for input(s): AMMONIA in the last 168 hours. Coagulation Profile: Recent Labs  Lab 11/28/16 0400 11/29/16 0746 11/30/16 0229 11/30/16 0849 12/01/16 0816  INR 3.36 4.96* 6.67* 6.13* 1.26   Cardiac Enzymes: Recent Labs  Lab 11/22/2016 2306  TROPONINI 0.21*   BNP (last 3 results) No results for input(s): PROBNP in the last 8760 hours. HbA1C: No results for input(s): HGBA1C in the last 72 hours. CBG: Recent Labs  Lab 11/30/16 1618 11/30/16 2013 11/30/16 2326 12/01/16 0413 12/01/16 1358  GLUCAP 255* 131* 111* 147* 135*   Lipid Profile: No results for input(s): CHOL, HDL, LDLCALC, TRIG, CHOLHDL, LDLDIRECT in the last 72 hours. Thyroid Function Tests: No results for input(s): TSH, T4TOTAL, FREET4, T3FREE, THYROIDAB in the last 72 hours. Anemia Panel: Recent Labs    12/01/16 0501  TIBC 137*  IRON 21*   Urine analysis:    Component Value Date/Time   COLORURINE YELLOW 10/06/2016 1353   APPEARANCEUR HAZY (A) 10/06/2016 1353   LABSPEC 1.009 10/06/2016 1353   PHURINE 6.0 10/06/2016 1353   GLUCOSEU NEGATIVE 10/06/2016 1353   HGBUR NEGATIVE 10/06/2016 1353   BILIRUBINUR NEGATIVE 10/06/2016 1353   KETONESUR NEGATIVE 10/06/2016 1353   PROTEINUR NEGATIVE 10/06/2016 1353   UROBILINOGEN 0.2 08/18/2014 1527   NITRITE NEGATIVE 10/06/2016 1353   LEUKOCYTESUR LARGE (A) 10/06/2016 1353   Sepsis Labs: @LABRCNTIP (procalcitonin:4,lacticidven:4)  ) Recent Results (from the past 240 hour(s))  Blood Culture (routine x 2)     Status: None   Collection Time: 11/30/2016  3:23 PM  Result Value Ref Range Status   Specimen Description BLOOD LEFT HAND  Final   Special Requests   Final    BOTTLES DRAWN AEROBIC ONLY Blood Culture adequate volume   Culture NO GROWTH 5 DAYS  Final   Report Status 12/01/2016 FINAL  Final  Gastrointestinal Panel by PCR , Stool      Status: None   Collection Time: 12/07/2016  4:06 PM  Result Value Ref Range Status   Campylobacter species NOT DETECTED NOT DETECTED Final   Plesimonas shigelloides NOT DETECTED NOT DETECTED Final   Salmonella species NOT DETECTED NOT DETECTED Final   Yersinia enterocolitica NOT DETECTED NOT DETECTED Final   Vibrio species NOT DETECTED NOT DETECTED Final   Vibrio cholerae NOT DETECTED NOT DETECTED Final   Enteroaggregative E coli (EAEC) NOT DETECTED NOT DETECTED Final   Enteropathogenic E coli (EPEC) NOT DETECTED NOT DETECTED Final   Enterotoxigenic E  coli (ETEC) NOT DETECTED NOT DETECTED Final   Shiga like toxin producing E coli (STEC) NOT DETECTED NOT DETECTED Final   Shigella/Enteroinvasive E coli (EIEC) NOT DETECTED NOT DETECTED Final   Cryptosporidium NOT DETECTED NOT DETECTED Final   Cyclospora cayetanensis NOT DETECTED NOT DETECTED Final   Entamoeba histolytica NOT DETECTED NOT DETECTED Final   Giardia lamblia NOT DETECTED NOT DETECTED Final   Adenovirus F40/41 NOT DETECTED NOT DETECTED Final   Astrovirus NOT DETECTED NOT DETECTED Final   Norovirus GI/GII NOT DETECTED NOT DETECTED Final   Rotavirus A NOT DETECTED NOT DETECTED Final   Sapovirus (I, II, IV, and V) NOT DETECTED NOT DETECTED Final  C difficile quick scan w PCR reflex     Status: Abnormal   Collection Time: 12/07/2016  4:06 PM  Result Value Ref Range Status   C Diff antigen POSITIVE (A) NEGATIVE Final   C Diff toxin NEGATIVE NEGATIVE Final   C Diff interpretation Results are indeterminate. See PCR results.  Final  Clostridium Difficile by PCR     Status: None   Collection Time: 12/11/2016  4:06 PM  Result Value Ref Range Status   Toxigenic C Difficile by pcr NEGATIVE NEGATIVE Final    Comment: Patient is colonized with non toxigenic C. difficile. May not need treatment unless significant symptoms are present.  MRSA PCR Screening     Status: Abnormal   Collection Time: 12/07/2016  9:34 PM  Result Value Ref Range Status     MRSA by PCR INVALID RESULTS, SPECIMEN SENT FOR CULTURE (A) NEGATIVE Final    Comment:        The GeneXpert MRSA Assay (FDA approved for NASAL specimens only), is one component of a comprehensive MRSA colonization surveillance program. It is not intended to diagnose MRSA infection nor to guide or monitor treatment for MRSA infections.   MRSA culture     Status: None   Collection Time: 12/07/2016  9:34 PM  Result Value Ref Range Status   Specimen Description NASAL SWAB  Final   Special Requests NONE  Final   Culture NO MRSA DETECTED  Final   Report Status 11/28/2016 FINAL  Final  Culture, blood (Routine X 2) w Reflex to ID Panel     Status: None (Preliminary result)   Collection Time: 11/23/2016 11:23 PM  Result Value Ref Range Status   Specimen Description BLOOD LEFT HAND  Final   Special Requests IN PEDIATRIC BOTTLE Blood Culture adequate volume  Final   Culture NO GROWTH 4 DAYS  Final   Report Status PENDING  Incomplete      Radiology Studies: Dg Chest Port 1 View  Result Date: 11/29/2016 CLINICAL DATA:  Acute respiratory failure with hypoxia EXAM: PORTABLE CHEST 1 VIEW COMPARISON:  11/28/2016 FINDINGS: Right dialysis catheter remains in place, unchanged. Prior CABG. Cardiomegaly. Vascular congestion and worsening interstitial and alveolar opacities in the lungs, left greater than right, favor asymmetric edema although infection cannot be completely excluded. Small left  has small bilateral effusions. IMPRESSION: Asymmetric airspace disease, left greater than right. Asymmetric edema versus infection. Small bilateral effusions. Electronically Signed   By: Rolm Baptise M.D.   On: 11/29/2016 08:37   Dg Chest Port 1 View  Result Date: 11/28/2016 CLINICAL DATA:  83 year old male with new end-stage renal disease on hemodialysis presenting with diarrhea abdominal pain fever cough and shortness of breath. Possible sepsis. EXAM: PORTABLE CHEST 1 VIEW COMPARISON:  11/27/2016 and earlier.  FINDINGS: Portable AP semi upright view at 0411  hours. Stable lung volumes. Regressed veiling opacity at the left lung base with now partial visualization of the left hemidiaphragm. Stable cardiomegaly and mediastinal contours. Stable right chest dialysis catheter. Prior CABG. Stable pulmonary vascularity. The right lung otherwise remains clear. Stable visualized osseous structures. No pneumoperitoneum identified. IMPRESSION: 1. Partial regression of left pleural effusion and lung base opacity. 2. Stable vascular congestion without overt edema. 3. No new cardiopulmonary abnormality. Electronically Signed   By: Genevie Ann M.D.   On: 11/28/2016 07:27     Scheduled Meds: . amiodarone  200 mg Oral Daily  . arformoterol  15 mcg Nebulization BID  . aspirin  81 mg Oral Daily  . budesonide (PULMICORT) nebulizer solution  0.5 mg Nebulization BID  . calcitRIOL  0.25 mcg Oral Daily  . chlorhexidine  15 mL Mouth Rinse BID  . [START ON 12/09/2016] darbepoetin (ARANESP) injection - DIALYSIS  60 mcg Intravenous Q Tue-HD  . hydrocortisone sod succinate (SOLU-CORTEF) inj  50 mg Intravenous Q6H  . insulin aspart  0-15 Units Subcutaneous Q4H  . mouth rinse  15 mL Mouth Rinse q12n4p  . midodrine  20 mg Oral TID WC   Continuous Infusions: . sodium chloride    . ceFEPime (MAXIPIME) IV Stopped (11/29/16 2021)  . ferric gluconate (FERRLECIT/NULECIT) IV Stopped (12/01/16 1231)  . heparin 1,400 Units/hr (11/30/16 1922)     LOS: 5 days   Time Spent in minutes   25 minutes  Alma Friendly on 12/01/2016 at 3:50 PM  Between 7am to 7pm - Pager - [phone]  After 7pm go to [email or web address] - password [email or web address]  And look for the night coverage person covering for me after hours  [facility] Office  [phone]

## 2016-12-01 NOTE — Progress Notes (Signed)
[facility] Progress Note  Subjective: no acute events overnight- discussion with pall care- is dnr but wants to cont with HD- belly  better- no need for surg intervention     Vitals:   12/01/16 0700 12/01/16 0800 12/01/16 0803 12/01/16 0806  BP: (!) 151/57 (!) 147/55    Pulse: 75 78    Resp: 18 18    Temp:      TempSrc:      SpO2: 100% 99% 100% 100%  Weight:      Height:        Inpatient medications: . amiodarone  200 mg Oral Daily  . arformoterol  15 mcg Nebulization BID  . aspirin  81 mg Oral Daily  . budesonide (PULMICORT) nebulizer solution  0.5 mg Nebulization BID  . chlorhexidine  15 mL Mouth Rinse BID  . darbepoetin (ARANESP) injection - DIALYSIS  60 mcg Intravenous Q Mon-HD  . [START ON 12/09/2016] darbepoetin (ARANESP) injection - DIALYSIS  60 mcg Intravenous Q Tue-HD  . heparin  2,000 Units Dialysis Once in dialysis  . hydrocortisone sod succinate (SOLU-CORTEF) inj  50 mg Intravenous Q6H  . insulin aspart  0-15 Units Subcutaneous Q4H  . mouth rinse  15 mL Mouth Rinse q12n4p  . midodrine  20 mg Oral TID WC   . sodium chloride    . sodium chloride    . sodium chloride    . ceFEPime (MAXIPIME) IV Stopped (11/29/16 2021)  . heparin 1,400 Units/hr (11/30/16 1922)   sodium chloride, sodium chloride, sodium chloride, alteplase, heparin, lidocaine (PF), lidocaine-prilocaine, pentafluoroprop-tetrafluoroeth, RESOURCE THICKENUP CLEAR  Exam: General: Ill appearing caucasian male Neck: Supple. JVD with mild elevation. CV: S1,S2, regularly irreg. No M/G/R Lungs: clear bilat , no wheezing Abd: distended with active BS. Non tender  Ext: no LE edema  Neuro: Alert and oriented X 3. Dialysis: Maturing RUA AVF + bruit RIJ TDC drsg CDI.   Dialysis Orders: [facility] 4h   90kg  2/2.25  Hep 2000  R IJ TDC/ maturing RUA AVF   Assessment: 1.  Sepsis - ischemic SB w pneumatosis, per CCS. Vanc/cefepime- WBC is decreasing  2.  SOB/ pulm edema - resolved- on 2 L  [place]- looks like more volume could be removed with HD 3.  ESRD - recent start. TTS HD.  Plan HD Monday on holiday sched 4.  Hypotension, chronic - got vol down, but still dyspneic. Difficult situation as wanting to avoid hypotension (for #1). Will repeat CXR.  UF today with HD as tolerates.  Adding back home midodrine may help- 20 TID.  5.  Anemia of CKD - HGB dropping into 8's.  started ESA darbe 60/wk w/ HD Monday. Last tsat here was low, 11% on Oct 24. Will replenish  6.  MBD of CKD - No binders/VDRA yet. last PTH over 400- will add calcitriol- 0.25 daily here but will convert at DC to TIW 7.  Nutrition -NPO except ice chips- now getting some clears. Albumin 2.4 8.  Afib: continue coumadin per primary/pharmacy. 9. GOC: Patient is DNR. Wants to continue HD- seems motivated to get better   Lindalee Huizinga A  12/01/2016, 8:23 AM   Recent Labs  Lab 11/27/16 0320  11/29/16 0746 11/30/16 0229 12/01/16 0501  NA 139   < > 134* 133* 134*  K 4.6   < > 4.0 3.3* 3.9  CL 103   < > 97* 97* 97*  CO2 22   < > 25 25 23   GLUCOSE 119*   < >  154* 149* 159*  BUN 46*   < > 53* 18 35*  CREATININE 4.79*   < > 4.94* 2.50* 3.94*  CALCIUM 8.0*   < > 8.2* 7.8* 8.4*  PHOS 4.8*  --   --   --  4.6   < > = values in this interval not displayed.   Recent Labs  Lab 11/16/2016 1455 11/29/16 0746 12/01/16 0501  AST 31 16 13*  ALT 24 16* 14*  ALKPHOS 97 75 67  BILITOT 1.6* 0.8 1.0  PROT 6.0* 5.1* 5.4*  ALBUMIN 2.3* 2.1* 2.4*   Recent Labs  Lab 11/28/2016 1455  11/29/16 0746 11/30/16 0229 12/01/16 0501  WBC 42.7*   < > 13.2* 13.2* 10.4  NEUTROABS 36.3*  --   --   --   --   HGB 10.6*   < > 8.2* 7.9* 8.8*  HCT 34.1*   < > 25.9* 25.0* 27.7*  MCV 97.7   < > 93.8 92.3 94.9  PLT 275   < > 189 161 216   < > = values in this interval not displayed.   Iron/TIBC/Ferritin/ %Sat    Component Value Date/Time   IRON 21 (L) 12/01/2016 0501   TIBC 137 (L) 12/01/2016 0501   IRONPCTSAT 15 (L) 12/01/2016 0501

## 2016-12-01 NOTE — Progress Notes (Signed)
  Speech Language Pathology Treatment: Dysphagia  Patient Details Name: [name] MRN: [id number] DOB: 1930-07-20 Today's Date: 12/01/2016 Time: 9509-3267 SLP Time Calculation (min) (ACUTE ONLY): 24 min  Assessment / Plan / Recommendation Clinical Impression  Pt consumed magic cup by very small spoonfuls with no overt signs of intolerance; however, advanced presentation of honey thick liquids by cup resulted in immediate coughing that is concerning for reduced airway protection. He appears to be on the most appropriate diet with given precautions - particularly liquid consumption by spoon.   His family present voiced concerns about his vocal quality and his ongoing dysphagia since his intubation during his last admission. His one daughter especially is eager for him to advance his liquid intake because he is "getting sick of" the honey thick liquids he is receiving on his tray. I reinforced that he is likely not ready for a repeat swallow study given his signs of potential aspiration even with cup sips of honey. I also provided education about the use of thickener to make a variety of liquids the appropriate consistency, which could give him some more flavor options.   It may be beneficial to do a FEES when he is ready for repeat testing, particularly since his voice remains so dysphonic. If his swallowing and voice changes continue to persist, he may even benefit from ENT consult, particularly since he had not been showing significant gains on repeat imaging before. For now, I would continue with the same diet and precautions, as well as using swallowing exercises that he had been recommended while on CIR.   HPI HPI: 83 year old male who presented to ER 11/14 with ongoing diarrhea, worsening shortness of breath, cough, fever of 103, abdominal distention and discomfort. Dialysis yesterday was not fully completed due to hypotension. Patient is on midodrine for chronic hypotension. Found to have  concern for sepis (?abd vs pulm source) admitted for work up. CT abd concerning for possible early distal small bowel ischemia. C-Diff Ag + / Toxin negative. CXR concerning for bibasilar atelectasis / effusion and patchy atelectasis.  Of note, patient had hospitalization in September for SOB, found to have multivessel CAD s/p CABG 02/06/56 complicated by renal failure and pneumothorax. Patient discharged to rehab over the last month but discharged home on Friday.      SLP Plan  Continue with current plan of care       Recommendations  Diet recommendations: Honey-thick liquid(full liquids per MD) Liquids provided via: Teaspoon Medication Administration: Crushed with puree Supervision: Patient able to self feed;Full supervision/cueing for compensatory strategies Compensations: Slow rate;Small sips/bites;Multiple dry swallows after each bite/sip Postural Changes and/or Swallow Maneuvers: Seated upright 90 degrees                Oral Care Recommendations: Oral care BID Follow up Recommendations: Skilled Nursing facility SLP Visit Diagnosis: Dysphagia, pharyngeal phase (R13.13) Plan: Continue with current plan of care       GO                Germain Osgood 12/01/2016, 4:03 PM  Germain Osgood, M.A. CCC-SLP [phone]

## 2016-12-01 NOTE — Progress Notes (Signed)
SLP Cancellation Note  Patient Details Name: CAYMAN KIELBASA MRN: [id number] DOB: [birth year]   Cancelled treatment:       Reason Eval/Treat Not Completed: Patient at procedure or test/unavailable (HD). Will f/u as able.   Germain Osgood 12/01/2016, 10:16 AM  Germain Osgood, M.A. CCC-SLP [phone]

## 2016-12-01 NOTE — Progress Notes (Signed)
Pt 's family called me to room. They are worried that their dad/s RT neck I am able to see Rt HD cath able to pal[pate RT HD cath under the skin - no noted swelling small amt of Ecchymosis at the insertion site - no crepitus , no c/o pain, no difficulty breathing.  Call to MD - MD came and examined pt - orders received

## 2016-12-01 NOTE — Progress Notes (Signed)
ANTICOAGULATION CONSULT NOTE - Follow Up Consult  Pharmacy Consult for heparin Indication: Afib and acute DVT despite supratherapeutic INR  Labs: Recent Labs    11/29/16 0746  11/30/16 0229 11/30/16 0849 11/30/16 1859 12/01/16 0501  HGB 8.2*  --  7.9*  --   --  8.8*  HCT 25.9*  --  25.0*  --   --  27.7*  PLT 189  --  161  --   --  216  LABPROT 45.8*  --  57.7* 54.0*  --   --   INR 4.96*  --  6.67* 6.13*  --   --   HEPARINUNFRC  --    < > 0.24* <0.10* 0.21* 0.46  CREATININE 4.94*  --  2.50*  --   --  3.94*   < > = values in this interval not displayed.    Assessment/Plan:  83yo male therapeutic on heparin after rate change. Will continue gtt at current rate and confirm stable with additional level.   Wynona Neat, PharmD, BCPS  12/01/2016,7:13 AM

## 2016-12-02 DIAGNOSIS — Z7189 Other specified counseling: Secondary | ICD-10-CM

## 2016-12-02 DIAGNOSIS — N186 End stage renal disease: Secondary | ICD-10-CM

## 2016-12-02 DIAGNOSIS — Z992 Dependence on renal dialysis: Secondary | ICD-10-CM

## 2016-12-02 LAB — BASIC METABOLIC PANEL
ANION GAP: 11 (ref 5–15)
BUN: 26 mg/dL — ABNORMAL HIGH (ref 6–20)
CALCIUM: 8.4 mg/dL — AB (ref 8.9–10.3)
CHLORIDE: 98 mmol/L — AB (ref 101–111)
CO2: 25 mmol/L (ref 22–32)
Creatinine, Ser: 3.03 mg/dL — ABNORMAL HIGH (ref 0.61–1.24)
GFR calc non Af Amer: 18 mL/min — ABNORMAL LOW (ref >60)
GFR, EST AFRICAN AMERICAN: 20 mL/min — AB (ref >60)
Glucose, Bld: 147 mg/dL — ABNORMAL HIGH (ref 65–99)
Potassium: 3.8 mmol/L (ref 3.5–5.1)
Sodium: 134 mmol/L — ABNORMAL LOW (ref 135–145)

## 2016-12-02 LAB — PTT FACTOR INHIBITOR (MIXING STUDY)
APTT 1 1 NORMAL PLASMA: 31.9 s — AB (ref 22.9–30.2)
APTT 11 NP INCUB. MIX CTL: 33.2 s — AB (ref 22.9–30.2)
APTT 11 NP MIX, 60 MIN, INCUB.: 32.9 s — AB (ref 22.9–30.2)
aPTT: 79.1 s — ABNORMAL HIGH (ref 22.9–30.2)

## 2016-12-02 LAB — GLUCOSE, CAPILLARY
GLUCOSE-CAPILLARY: 119 mg/dL — AB (ref 65–99)
GLUCOSE-CAPILLARY: 145 mg/dL — AB (ref 65–99)
GLUCOSE-CAPILLARY: 179 mg/dL — AB (ref 65–99)
GLUCOSE-CAPILLARY: 175 mg/dL — AB (ref 65–99)
Glucose-Capillary: 145 mg/dL — ABNORMAL HIGH (ref 65–99)
Glucose-Capillary: 230 mg/dL — ABNORMAL HIGH (ref 65–99)
Glucose-Capillary: 198 mg/dL — ABNORMAL HIGH (ref 65–99)

## 2016-12-02 LAB — CBC
HCT: 28.7 % — ABNORMAL LOW (ref 39.0–52.0)
HEMOGLOBIN: 9 g/dL — AB (ref 13.0–17.0)
MCH: 29.6 pg (ref 26.0–34.0)
MCHC: 31.4 g/dL (ref 30.0–36.0)
MCV: 94.4 fL (ref 78.0–100.0)
Platelets: 260 10*3/uL (ref 150–400)
RBC: 3.04 MIL/uL — AB (ref 4.22–5.81)
RDW: 21.5 % — ABNORMAL HIGH (ref 11.5–15.5)
WBC: 11.9 10*3/uL — ABNORMAL HIGH (ref 4.0–10.5)

## 2016-12-02 LAB — CULTURE, BLOOD (ROUTINE X 2)
CULTURE: NO GROWTH
SPECIAL REQUESTS: ADEQUATE

## 2016-12-02 LAB — HEPARIN LEVEL (UNFRACTIONATED)
Heparin Unfractionated: 0.32 IU/mL (ref 0.30–0.70)
Heparin Unfractionated: <0.1 IU/mL — ABNORMAL LOW (ref 0.30–0.70)

## 2016-12-02 LAB — PT FACTOR INHIBITOR (MIXING STUDY)
1 HR INCUB PT 1:1NP: 12.6 s — ABNORMAL HIGH (ref 9.6–11.5)
PT 1:1NP: 11.8 s — ABNORMAL HIGH (ref 9.6–11.5)
PT: 58.3 s — AB (ref 9.6–11.5)

## 2016-12-02 LAB — PROTIME-INR
INR: 1.31
PROTHROMBIN TIME: 16.2 s — AB (ref 11.4–15.2)

## 2016-12-02 MED ORDER — GLUCERNA SHAKE PO LIQD
237.0000 mL | Freq: Two times a day (BID) | ORAL | Status: DC
  Administered 2016-12-04 (×2): 237 mL via ORAL

## 2016-12-02 MED ORDER — WARFARIN SODIUM 5 MG PO TABS
2.5000 mg | ORAL_TABLET | Freq: Once | ORAL | Status: AC
  Administered 2016-12-02: 2.5 mg via ORAL
  Filled 2016-12-02: qty 1

## 2016-12-02 MED ORDER — ONDANSETRON HCL 4 MG/2ML IJ SOLN
4.0000 mg | Freq: Four times a day (QID) | INTRAMUSCULAR | Status: DC | PRN
  Administered 2016-12-03 – 2016-12-05 (×5): 4 mg via INTRAVENOUS
  Filled 2016-12-02 (×5): qty 2

## 2016-12-02 MED ORDER — HYDROCORTISONE NA SUCCINATE PF 100 MG IJ SOLR
50.0000 mg | Freq: Two times a day (BID) | INTRAMUSCULAR | Status: DC
  Administered 2016-12-03 – 2016-12-05 (×5): 50 mg via INTRAVENOUS
  Filled 2016-12-02 (×5): qty 2

## 2016-12-02 NOTE — Clinical Social Work Note (Signed)
Clinical Social Work Assessment  Patient Details  Name: [name] MRN: [id number] Date of Birth: May 17, 1930  Date of referral:  12/02/16               Reason for consult:  Facility Placement                Permission sought to share information with:  Chartered certified accountant granted to share information::  Yes, Verbal Permission Granted  Name::     Publishing rights manager::  SNF  Relationship::  wife  Contact Information:     Housing/Transportation Living arrangements for the past 2 months:  Single Family Home Source of Information:  Patient, Spouse, Adult Children Patient Interpreter Needed:  None Criminal Activity/Legal Involvement Pertinent to Current Situation/Hospitalization:  No - Comment as needed Significant Relationships:  Adult Children, Spouse Lives with:  Spouse Do you feel safe going back to the place where you live?  No Need for family participation in patient care:  No (Coment)  Care giving concerns:  Pt lives at home with spouse but currently needing higher level of assistance than normal.   Facilities manager / plan:  CSW spoke with pt and pt family at bedside concerning PT recommendation for SNF.  CSW explained SNF and SNF referral process.  Employment status:  Retired Forensic scientist:  Medicare PT Recommendations:  [place] / Referral to community resources:  [facility]  Patient/Family's Response to care:  Pt and family agreeable to SNF at this time- hopeful for placement near home so he can be close to family.  Patient/Family's Understanding of and Emotional Response to Diagnosis, Current Treatment, and Prognosis:  Pt and pt family has good understanding of current condition and hopeful for patient improvement.  Realistic about pt condition and interested in continuing conversation with palliative.  Emotional Assessment Appearance:  Appears stated age Attitude/Demeanor/Rapport:    Affect  (typically observed):  Appropriate, Pleasant Orientation:  Oriented to Self, Oriented to Place, Oriented to  Time, Oriented to Situation Alcohol / Substance use:  Not Applicable Psych involvement (Current and /or in the community):  No (Comment)  Discharge Needs  Concerns to be addressed:  Care Coordination Readmission within the last 30 days:  Yes Current discharge risk:  Physical Impairment Barriers to Discharge:  Continued Medical Work up   Jorge Ny, LCSW 12/02/2016, 11:50 AM

## 2016-12-02 NOTE — Progress Notes (Signed)
Initial Nutrition Assessment  DOCUMENTATION CODES:   Not applicable  INTERVENTION:   Glucerna Shake po BID (thickened to honey thick consistency), each supplement provides 220 kcal and 10 grams of protein  Snacks BID  NUTRITION DIAGNOSIS:   Increased nutrient needs related to wound healing, acute illness as evidenced by estimated needs.  GOAL:   Patient will meet greater than or equal to 90% of their needs  MONITOR:   PO intake, Supplement acceptance, Labs, Diet advancement, Weight trends, I & O's, Skin  REASON FOR ASSESSMENT:   Low Braden   ASSESSMENT:   Pt with PMH of HLD, HTN, GERD, A. FIB, CHF, CAD s/p CABG and MAZE (10/09/16), COPD, type II DM,  presents with severe sepsis likely GI related as CT scan concerning for early distal small bowel ischemia.     Pt with ESRD recent start to HD, yesterday 1 L removed. Palliative care team consulted   Spoke with pt and multiple family members at bedside.  Per chart review pt consuming 10% of meals at this time.  Pt reports a decreased appetite and feels limited to his restricted diet, poor fluid consumption. Pt's family requested pudding at meal times and thickened broths to aid in intake. Upon chart review last admission pt received thickened Glucerna, pt requesting again.   Labs reviewed; CBG 111-181, Na 134, Chloride 98, BUN 26, Albumin 2.4 (12/01/16), Hemoglobin 9.0 Medications reviewed;Solu-Cortef, aspirin, calcitriol, sliding scale insulin, Coumadin  NUTRITION - FOCUSED PHYSICAL EXAM:  Unable to complete at this time  Diet Order:  DIET SOFT Room service appropriate? Yes; Fluid consistency: Honey Thick  EDUCATION NEEDS:   No education needs have been identified at this time  Skin:  Skin Assessment: Skin Integrity Issues: Skin Integrity Issues:: Stage II, Incisions Stage II: buttocks Incisions: chest, R leg, R arm  Last BM:  12/01/16  Height:   Ht Readings from Last 1 Encounters:  12/02/16 5\' 8"  (1.727 m)     Weight:   Wt Readings from Last 1 Encounters:  12/02/16 192 lb 3.9 oz (87.2 kg)    Ideal Body Weight:  70 kg  BMI:  Body mass index is 29.23 kg/m.  Estimated Nutritional Needs:   Kcal:  2947-6546  Protein:  115-125 grams  Fluid:  >/= 1.7 L/d  Parks Ranger, MS, RDN, LDN 12/02/2016 2:03 PM

## 2016-12-02 NOTE — Care Management Note (Addendum)
Case Management Note  Patient Details  Name: [name] MRN: [id number] Date of Birth: August 18, 1930  Subjective/Objective:    Severe Sepsis, Acute Resp Failure, pl effusion, ESRD, small bowel pneumatosis secondary to ischemia , LE DVT, on clears, advancing slowly, supra therapeutic inr, no indication for surgery, conts on iv abx, hep drip, solucortef iv, plan is for SNF, CSW following.  [facility] is not taking HD patients just yet.                 Action/Plan: NCM will follow along with CSW.  Expected Discharge Date:                  Expected Discharge Plan:  Skilled Nursing Facility  In-House Referral:  Clinical Social Work  Discharge planning Services  CM Consult  Post Acute Care Choice:    Choice offered to:     DME Arranged:    DME Agency:     HH Arranged:    [facility] Agency:     Status of Service:  Completed, signed off  If discussed at [employer], dates discussed:    Additional Comments:  Zenon Mayo, RN 12/02/2016, 3:27 PM

## 2016-12-02 NOTE — Progress Notes (Signed)
CC:  Tolerating diet  Subjective: He has some soreness Right side, but better.  Tolerating PO's and bowel function.    Objective: Vital signs in last 24 hours: Temp:  [96.4 F (35.8 C)-99.2 F (37.3 C)] 97.7 F (36.5 C) (11/20 0813) Pulse Rate:  [74-78] 76 (11/20 0813) Resp:  [15-20] 16 (11/20 0813) BP: (92-175)/(51-76) 92/76 (11/20 0813) SpO2:  [91 %-100 %] 100 % (11/20 0813) Weight:  [87.2 kg (192 lb 3.9 oz)-87.7 kg (193 lb 5.5 oz)] 87.2 kg (192 lb 3.9 oz) (11/20 0500) Last BM Date: 12/01/16  Intake/Output from previous day: 11/19 0701 - 11/20 0700 In: 1054.1 [P.O.:400; I.V.:544.1; IV Piggyback:110] Out: 2000  Intake/Output this shift: No intake/output data recorded.  General appearance: alert, cooperative and no distress GI: soft, still sore right side.  No distension, + BS, and BM  Lab Results:  Recent Labs    12/01/16 0501 12/02/16 0605  WBC 10.4 11.9*  HGB 8.8* 9.0*  HCT 27.7* 28.7*  PLT 216 260    BMET Recent Labs    12/01/16 0501 12/02/16 0605  NA 134* 134*  K 3.9 3.8  CL 97* 98*  CO2 23 25  GLUCOSE 159* 147*  BUN 35* 26*  CREATININE 3.94* 3.03*  CALCIUM 8.4* 8.4*   PT/INR Recent Labs    12/01/16 0816 12/02/16 0605  LABPROT 15.7* 16.2*  INR 1.26 1.31    Recent Labs  Lab 11/13/2016 1455 11/29/16 0746 12/01/16 0501  AST 31 16 13*  ALT 24 16* 14*  ALKPHOS 97 75 67  BILITOT 1.6* 0.8 1.0  PROT 6.0* 5.1* 5.4*  ALBUMIN 2.3* 2.1* 2.4*     Lipase     Component Value Date/Time   LIPASE 23 10/16/2016 1515     Medications: . amiodarone  200 mg Oral Daily  . arformoterol  15 mcg Nebulization BID  . aspirin  81 mg Oral Daily  . budesonide (PULMICORT) nebulizer solution  0.5 mg Nebulization BID  . calcitRIOL  0.25 mcg Oral Daily  . chlorhexidine  15 mL Mouth Rinse BID  . [START ON 12/09/2016] darbepoetin (ARANESP) injection - DIALYSIS  60 mcg Intravenous Q Tue-HD  . hydrocortisone sod succinate (SOLU-CORTEF) inj  50 mg  Intravenous Q6H  . insulin aspart  0-15 Units Subcutaneous Q4H  . mouth rinse  15 mL Mouth Rinse q12n4p  . midodrine  20 mg Oral TID WC  . warfarin  2.5 mg Oral ONCE-1800  . Warfarin - Pharmacist Dosing Inpatient   Does not apply q1800   . sodium chloride    . ceFEPime (MAXIPIME) IV 1 g (12/01/16 1707)  . ferric gluconate (FERRLECIT/NULECIT) IV Stopped (12/01/16 1231)  . heparin 1,300 Units/hr (12/01/16 1430)   Anti-infectives (From admission, onward)   Start     Dose/Rate Route Frequency Ordered Stop   11/29/16 1200  vancomycin (VANCOCIN) IVPB 1000 mg/200 mL premix  Status:  Discontinued     1,000 mg 200 mL/hr over 60 Minutes Intravenous Every T-Th-Sa (Hemodialysis) 11/28/16 0659 11/28/16 0915   11/28/16 0700  vancomycin (VANCOCIN) IVPB 1000 mg/200 mL premix     1,000 mg 200 mL/hr over 60 Minutes Intravenous STAT 11/28/16 0658 11/28/16 0900   11/27/16 1800  ceFEPIme (MAXIPIME) 1 g in dextrose 5 % 50 mL IVPB     1 g 100 mL/hr over 30 Minutes Intravenous Every 24 hours 11/18/2016 1745     11/14/2016 1700  vancomycin (VANCOCIN) 50 mg/mL oral solution 125 mg  Status:  Discontinued     125 mg Oral Every 6 hours 11/16/2016 1638 11/27/16 1019   11/13/2016 1530  vancomycin (VANCOCIN) IVPB 1000 mg/200 mL premix  Status:  Discontinued     1,000 mg 200 mL/hr over 60 Minutes Intravenous  Once 11/16/2016 1520 12/06/2016 1521   11/25/2016 1530  ceFEPIme (MAXIPIME) 2 g in dextrose 5 % 50 mL IVPB     2 g 100 mL/hr over 30 Minutes Intravenous  Once 11/16/2016 1520 11/15/2016 1702   12/01/2016 1530  vancomycin (VANCOCIN) 2,000 mg in sodium chloride 0.9 % 500 mL IVPB     2,000 mg 250 mL/hr over 120 Minutes Intravenous  Once 12/10/2016 1521 11/28/2016 1829      Assessment/Plan Small bowel pneumatosis secondary to ischemia and low flow state, presumed  Sepsis  COPD/hypoxic respiratory failure  Atrial fibrillation - IV heparin  CABG 09/2016  ESRD  - HD  TTS  Hypotension - stable  Diabetes -  controlled  DVT/RUE  - IV heparin  Rheumatoid arthritis - on steroids  FEN:  Soft diet ID:  Vancomycin 11/14-11/18/18 DVT:  Heparin drip - INR 1.31   PLan:  Improving, diet being advanced, having BM's.  No surgical issues at this point.  We currently do not have anything to add.  Please call if we can help.    LOS: 6 days    [name] 12/02/2016 [phone]

## 2016-12-02 NOTE — Progress Notes (Signed)
Daily Progress Note   Patient Name: [name]       Date: 12/02/2016 DOB: 19-Aug-1930  Age: 83 y.o. MRN#: [id number] Attending Physician: Alma Friendly, MD Primary Care Physician: [name], MD Admit Date: 11/18/2016  Reason for Consultation/Follow-up: Establishing goals of care  Subjective: Pt reports he feels better. Still some RLQ pain in abd, but improving. Tolerating food. Looking forward to leaving the hospital soon - daughter is out touring rehab facilities. Denies pain/shortness of breath.  Length of Stay: 6  Current Medications: Scheduled Meds:  . amiodarone  200 mg Oral Daily  . arformoterol  15 mcg Nebulization BID  . aspirin  81 mg Oral Daily  . budesonide (PULMICORT) nebulizer solution  0.5 mg Nebulization BID  . calcitRIOL  0.25 mcg Oral Daily  . chlorhexidine  15 mL Mouth Rinse BID  . [START ON 12/09/2016] darbepoetin (ARANESP) injection - DIALYSIS  60 mcg Intravenous Q Tue-HD  . feeding supplement (GLUCERNA SHAKE)  237 mL Oral BID BM  . hydrocortisone sod succinate (SOLU-CORTEF) inj  50 mg Intravenous Q6H  . insulin aspart  0-15 Units Subcutaneous Q4H  . mouth rinse  15 mL Mouth Rinse q12n4p  . midodrine  20 mg Oral TID WC  . warfarin  2.5 mg Oral ONCE-1800  . Warfarin - Pharmacist Dosing Inpatient   Does not apply q1800    Continuous Infusions: . sodium chloride    . ceFEPime (MAXIPIME) IV 1 g (12/01/16 1707)  . ferric gluconate (FERRLECIT/NULECIT) IV Stopped (12/01/16 1231)  . heparin 1,400 Units/hr (12/02/16 1330)    PRN Meds: sodium chloride, RESOURCE THICKENUP CLEAR  Physical Exam  Constitutional: He is oriented to person, place, and time. He appears well-developed and well-nourished. No distress.  HENT:  Head: Normocephalic and atraumatic.    Cardiovascular: Normal rate.  Pulmonary/Chest: Effort normal. No respiratory distress.  Neurological: He is alert and oriented to person, place, and time.  Skin: Skin is warm and dry. Capillary refill takes less than 2 seconds. He is not diaphoretic.  Psychiatric: He has a normal mood and affect. His behavior is normal.            Vital Signs: BP (!) 152/70 (BP Location: Right Leg)   Pulse 77   Temp (!) 97.4 F (36.3 C) (Oral)   Resp 19   Ht _0  (1.727 m)   Wt 87.2 kg (192 lb 3.9 oz)   SpO2 97%   BMI 29.23 kg/m  SpO2: SpO2: 97 % O2 Device: O2 Device: Nasal Cannula O2 Flow Rate: O2 Flow Rate (L/min): 2 L/min  Intake/output summary:   Intake/Output Summary (Last 24 hours) at 12/02/2016 1455 Last data filed at 12/02/2016 0230 Gross per 24 hour  Intake 463.5 ml  Output 0 ml  Net 463.5 ml   LBM: Last BM Date: 12/01/16 Baseline Weight: Weight: 90 kg (198 lb 6.6 oz) Most recent weight: Weight: 87.2 kg (192 lb 3.9 oz)       Palliative Assessment/Data: 50%    Flowsheet Rows     Most Recent Value  Intake Tab  Referral Department  Hospitalist  Unit at Time of Referral  Intermediate Care Unit  Palliative Care Primary  Diagnosis  Sepsis/Infectious Disease  Date Notified  11/29/16  Palliative Care Type  New Palliative care  Reason for referral  Clarify Goals of Care, Psychosocial or Spiritual support  Date of Admission  12/06/2016  Date first seen by Palliative Care  11/30/16  # of days Palliative referral response time  1 Day(s)  # of days IP prior to Palliative referral  3  Clinical Assessment  Palliative Performance Scale Score  30%  Pain Max last 24 hours  0  Pain Min Last 24 hours  0  Dyspnea Max Last 24 Hours  6  Dyspnea Min Last 24 hours  1  Nausea Max Last 24 Hours  0  Nausea Min Last 24 Hours  0  Anxiety Max Last 24 Hours  0  Anxiety Min Last 24 Hours  0  Other Max Last 24 Hours  0  Psychosocial & Spiritual Assessment  Palliative Care Outcomes   Patient/Family meeting held?  Yes  Who was at the meeting?  pt, daughter, wife  Palliative Care follow-up planned  Yes, Facility      Patient Active Problem List   Diagnosis Date Noted  . Pressure injury of skin 12/01/2016  . Palliative care by specialist   . Severe sepsis ([place]) 11/29/2016  . Septic shock ([place])   . Diarrhea of presumed infectious origin   . Small bowel ischemia ([place])   . Chronic anticoagulation   . Dependence on renal dialysis ([place])   . Fungal rash of trunk   . Right forearm pain   . Finger pain, right   . Primary osteoarthritis of right elbow   . Hemodialysis-associated hypotension   . Labile blood glucose   . Shortness of breath   . Type 2 diabetes mellitus with peripheral neuropathy (HCC)   . PAF (paroxysmal atrial fibrillation) ([place])   . Debility 11/03/2016  . Coronary artery disease involving native heart without angina pectoris   . AKI (acute kidney injury) ([place])   . Dysphagia   . Chronic diastolic heart failure ([place])   . Benign prostatic hyperplasia   . Hx of CABG   . S/P CABG (coronary artery bypass graft)   . Acute blood loss anemia   . Anemia of chronic disease   . Chronic obstructive pulmonary disease ([place])   . Cardiogenic shock ([place])   . Spontaneous pneumothorax   . Acute respiratory failure with hypoxia ([place])   . Acute renal failure ([place])   . S/P CABG x 2 + maze procedure 10/09/2016  . S/P Maze operation for atrial fibrillation 10/09/2016  . Coronary artery disease involving native coronary artery of native heart with angina pectoris ([place]) 09/30/2016  . Fusarium infection ([place]) 01/10/2016  . Dyspnea on exertion 09/12/2015  . Chronic systolic CHF (congestive heart failure) ([place])   . Chronic kidney disease (CKD), stage IV (severe) ([place])   . Cellulitis of left lower extremity   . Pulmonary hypertension ([place])   . Paroxysmal atrial fibrillation (HCC)   . Other emphysema ([place])   . Bronchiectasis without complication ([place])   . Other  specified hypothyroidism   . CKD (chronic kidney disease) stage 4, GFR 15-29 ml/min (HCC) 08/18/2014  . Acute on chronic systolic heart failure ([place]) 08/18/2014  . Bronchiectasis ([place]) 08/18/2014  . Hypothyroidism 08/18/2014  . Bronchiectasis without acute exacerbation ([place]) 12/29/2013  . LVH (left ventricular hypertrophy) due to hypertensive disease 12/10/2013  . Atrial fibrillation ([place]) 12/08/2013  . CHF (congestive heart failure) ([place]) 12/07/2013  . Chronic obstructive airway  disease with asthma ([place]) 12/07/2013  . Thyroid goiter 02/01/2013  . Low TSH level 05/18/2012  . Right thyroid nodule 05/18/2012  . Diabetes ([place]) 05/18/2012  . HLD (hyperlipidemia) 05/18/2012  . Rheumatoid arthritis ([place]) 05/18/2012  . Essential hypertension 05/18/2012  . GERD (gastroesophageal reflux disease) 05/18/2012  . Anemia 05/18/2012  . Knee pain 02/12/2012    Palliative Care Assessment & Plan   HPI: 83 y.o. male  with past medical history of CHF with ejection fraction 45-50%, COPD, HTN, DM, CAD w/ CABG 8/42/1031 (complicated by renal failure, pneumothorax; patient discharged to rehab then home). Admitted 11/16/2016 with diarrhea, increased shortness of breath, and fever. Patient was only home for 4 days before being readmitted. He was admitted with sepsis, likely GI source. CT scan concerning for early distal small bowel ischemia. He has been undergoing hemodialysis during this hospitalization. Last dialysis treatment was on 12/01/2016. He has been started on heparin for management of DVT left IJ and SVT in L cephalic vein.  He has been seen by surgical services secondary to small bowel ischemia concerns.  He has been on conservative management and has been improving.  He is not a surgical candidate secondary to all of his comorbidities.  PMT consult ordered for goals of care.   Assessment: Met w/ pt and wife at bedside. They are pleased with patient's improvement. Report that he is tolerating HD  well, plans to proceed with HD. Plan to be discharged tomorrow to SNF. Agreeable to palliative follow up at SNF. Hospice is not an option as long as patient would like to pursue HD.  Recommendations/Plan:  Please write for palliative follow up at SNF upon discharge  Goals of Care and Additional Recommendations:  Limitations on Scope of Treatment: Full Scope Treatment - DNR  Code Status:  DNR  Prognosis:   Unable to determine  Discharge Planning:  [place] for rehab with Palliative care service follow-up  Care plan was discussed with patient and wife  Thank you for allowing the Palliative Medicine Team to assist in the care of this patient.   Total Time 25 minutes Prolonged Time Billed  no       Greater than 50%  of this time was spent counseling and coordinating care related to the above assessment and plan.  Juel Burrow, DNP, AGNP-C Palliative Medicine Team Team Phone # [phone]

## 2016-12-02 NOTE — Progress Notes (Signed)
ANTICOAGULATION CONSULT NOTE - Follow Up Consult  Pharmacy Consult for Heparin and Warfarin Indication: atrial fibrillation, new LUE DVT  No Known Allergies  Patient Measurements: Height: 5\' 8"  (172.7 cm) Weight: 192 lb 3.9 oz (87.2 kg) IBW/kg (Calculated) : 68.4 Heparin Dosing Weight: 86 kg  Vital Signs: Temp: 98 F (36.7 C) (11/20 1922) Temp Source: Axillary (11/20 1922) BP: 163/52 (11/20 1922) Pulse Rate: 74 (11/20 1922)  Labs: Recent Labs    11/30/16 0229 11/30/16 0849 11/30/16 1233  12/01/16 0501 12/01/16 0816  12/01/16 1505 12/02/16 0605 12/02/16 1926  HGB 7.9*  --   --   --  8.8*  --   --   --  9.0*  --   HCT 25.0*  --   --   --  27.7*  --   --   --  28.7*  --   PLT 161  --   --   --  216  --   --   --  260  --   APTT  --   --  79.1*  --   --   --   --   --   --   --   LABPROT 57.7* 54.0*  --   --   --  15.7*  --   --  16.2*  --   INR 6.67* 6.13*  --   --   --  1.26  --   --  1.31  --   HEPARINUNFRC 0.24* <0.10*  --    < > 0.46  --    < > 0.76* 0.32 <0.10*  CREATININE 2.50*  --   --   --  3.94*  --   --   --  3.03*  --    < > = values in this interval not displayed.    Estimated Creatinine Clearance: 19.5 mL/min (A) (by C-G formula based on SCr of 3.03 mg/dL (H)).   Medications:  Scheduled:   Infusions:  . sodium chloride    . ceFEPime (MAXIPIME) IV 1 g (12/02/16 1837)  . ferric gluconate (FERRLECIT/NULECIT) IV 125 mg (12/02/16 1519)  . heparin 1,400 Units/hr (12/02/16 1330)    Assessment: 83 year old male on chronic anticoagulation with Coumadin for atrial fibrillation. He also developed a LUE DVT this admission while his INR was supratherapeutic. He is currently receiving a heparin bridge to Coumadin.  Heparin level this evening is undetectable however per discussion with RN - the drip was held for 1 hour right around the lab draw in order to run antibiotics in. The heparin drip was resumed around 1930. Will re-time a level from drip re-start.    Goal of Therapy:  Heparin level 0.3-0.7 units/ml Monitor platelets by anticoagulation protocol: Yes   Plan:  1. Continue Heparin at 1400 units/hr 2. Will continue to monitor for any signs/symptoms of bleeding and will follow up with heparin level in 8 hours   Thank you for allowing pharmacy to be a part of this patient's care.  Alycia Rossetti, PharmD, BCPS Clinical Pharmacist Pager: [phone] If after 3:30p, please call main pharmacy at: [phone] 12/02/2016 8:50 PM

## 2016-12-02 NOTE — Progress Notes (Signed)
Physical Therapy Treatment Patient Details Name: [name] MRN: [id number] DOB: 1930/10/06 Today's Date: 12/02/2016    History of Present Illness 83 year old male who presented to ER 11/14 with ongoing diarrhea, worsening shortness of breath, cough, fever of 103, abdominal distention and discomfort.  Dialysis yesterday was not fully completed due to hypotension.  Patient is on midodrine for chronic hypotension. Found to have concern for sepis (?abd vs pulm source) admitted for work up.  CT abd concerning for possible early distal small bowel ischemia.  C-Diff Ag + / Toxin negative.  CXR concerning for bibasilar atelectasis / effusion and patchy atelectasis.  Of note, patient had hospitalization in September for SOB, found to have multivessel CAD s/p CABG 1/49/70 complicated by renal failure and pneumothorax.  Patient discharged to rehab over the last month but discharged home on Friday. PMHx: hypertension, chronic diastolic congestive heart failure, stage IV chronic kidney disease, hyperlipidemia, recurrent PAF on long-term anticoagulation, bronchiectasis with chronic dyspnea on exertion, COPD, asthma, RA, DM, and anemia     PT Comments    Pt admitted with above diagnosis. Pt currently with functional limitations due to the deficits listed below (see PT Problem List). Pt was able to ambulate today with chair follow.  Limited distance as pt reports he gets weak fast.  Pulse ox not picking up well during the walk.  O2 at 6LO2 to keep sats in mid to high 80's if registering.  Back to 2LO2 after walk and sats >90%.  Other VSS.  Progressing although slowly.   Pt will benefit from skilled PT to increase their independence and safety with mobility to allow discharge to the venue listed below.     Follow Up Recommendations  SNF;Supervision/Assistance - 24 hour     Equipment Recommendations  Wheelchair (measurements PT);Wheelchair cushion (measurements PT)    Recommendations for Other Services        Precautions / Restrictions Precautions Precautions: Fall;Sternal Precaution Comments: recent CABG  Restrictions Weight Bearing Restrictions: No RUE Weight Bearing: Weight bearing as tolerated    Mobility  Bed Mobility Overal bed mobility: Needs Assistance Bed Mobility: Rolling;Sidelying to Sit Rolling: Min assist Sidelying to sit: Mod assist       General bed mobility comments: Needed incr assist for elevation of trunk.   Transfers Overall transfer level: Needs assistance Equipment used: Rolling walker (2 wheeled) Transfers: Sit to/from [facility] Sit to Stand: Min assist;+2 physical assistance;From elevated surface Stand pivot transfers: Min assist;+2 physical assistance       General transfer comment: Bil UE support needed with c/o knee buckling  Ambulation/Gait Ambulation/Gait assistance: Min assist;+2 safety/equipment Ambulation Distance (Feet): 24 Feet(12 feet x 2) Assistive device: Rolling walker (2 wheeled) Gait Pattern/deviations: Step-through pattern;Decreased stride length;Trunk flexed Gait velocity: decreased Gait velocity interpretation: Below normal speed for age/gender General Gait Details: Pt was able to ambulate with RW with min assist and cues.  Needs max encouragement.    Felt like his LEs were weak.  Steadying assist needed.     Stairs            Wheelchair Mobility    Modified Rankin (Stroke Patients Only)       Balance Overall balance assessment: Needs assistance Sitting-balance support: Feet supported;Bilateral upper extremity supported Sitting balance-Leahy Scale: Poor Sitting balance - Comments: slight posterior lean needing min guard assist to sit EOB. Postural control: Posterior lean Standing balance support: Bilateral upper extremity supported;During functional activity Standing balance-Leahy Scale: Poor Standing balance comment: relies  on bil UE support.                             Cognition  Arousal/Alertness: Awake/alert Behavior During Therapy: WFL for tasks assessed/performed Overall Cognitive Status: Within Functional Limits for tasks assessed                                        Exercises General Exercises - Lower Extremity Ankle Circles/Pumps: AROM;Both;10 reps;Supine Long Arc Quad: AROM;Both;10 reps;Seated Hip Flexion/Marching: AROM;Both;10 reps;Seated    General Comments        Pertinent Vitals/Pain Pain Assessment: No/denies pain    Home Living                      Prior Function            PT Goals (current goals can now be found in the care plan section) Acute Rehab PT Goals Patient Stated Goal: to go home Progress towards PT goals: Progressing toward goals    Frequency    Min 2X/week      PT Plan Current plan remains appropriate    Co-evaluation              AM-PAC PT "6 Clicks" Daily Activity  Outcome Measure  Difficulty turning over in bed (including adjusting bedclothes, sheets and blankets)?: Unable Difficulty moving from lying on back to sitting on the side of the bed? : Unable Difficulty sitting down on and standing up from a chair with arms (e.g., wheelchair, bedside commode, etc,.)?: Unable Help needed moving to and from a bed to chair (including a wheelchair)?: A Lot Help needed walking in hospital room?: A Lot Help needed climbing 3-5 steps with a railing? : Total 6 Click Score: 8    End of Session Equipment Utilized During Treatment: Gait belt;Oxygen Activity Tolerance: Patient limited by fatigue Patient left: in chair;with call bell/phone within reach;with family/visitor present Nurse Communication: Mobility status PT Visit Diagnosis: Unsteadiness on feet (R26.81);Muscle weakness (generalized) (M62.81)     Time: 5102-5852 PT Time Calculation (min) (ACUTE ONLY): 14 min  Charges:  $Gait Training: 8-22 mins                    G Codes:       Rue Tinnel,PT Acute  Rehabilitation [phone] (pager)    Denice Paradise 12/02/2016, 10:11 AM

## 2016-12-02 NOTE — Progress Notes (Signed)
PROGRESS NOTE    [name]  PJA:[id number] DOB: 1930/01/29 DOA: 11/21/2016 PCP: [name], MD   Chief Complaint  Patient presents with  . Hypotension  . Diarrhea    Brief Narrative:  HPI on 12/08/2016 by Dr. Aloha Gell, PCCM 83 year old male who presented to ER 11/14 with ongoing diarrhea, worsening shortness of breath, cough, fever of 103, abdominal distention and discomfort.  Dialysis yesterday was not fully completed due to hypotension.  Patient is on midodrine for chronic hypotension. Found to have concern for sepis (?abd vs pulm source) admitted for work up.  CT abd concerning for possible early distal small bowel ischemia.  C-Diff Ag + / Toxin negative.  CXR concerning for bibasilar atelectasis / effusion and patchy atelectasis.   Of note, patient had hospitalization in September for SOB, found to have multivessel CAD s/p CABG 3/41/93 complicated by renal failure and pneumothorax.  Patient discharged to rehab over the last month but discharged home on Friday. Now ESRD on HD   Interim history Admitted for severe sepsis, likely secondary to GI source. Patient also noted to have acute respiratory failure with pleural effusion which appears to be improving. Nephrology consulted for ESRD. General surgery consulted for small bowel pneumatosis secondary to ischemia and low flow state, recommending conservative management. [facility] assumed care on 11/29/2016.  Assessment & Plan   Sepsis possibly secondary to small bowel pneumatosis/ischemia vs aspiration Resolving -Patient noted to have hypotension and volume depletion -Was initially on vancomycin and cefepime, however vanc discontinued on 11/28/2016. Last dose of IV cefepime for a total of 7 days on 12/02/16 -CT abd/pelvis: mild small bowel distention, pneumotosis, ?early distal small bowel ishcemia -General surgery consulted and appreciated, patient not a surgical candidate, continue supportive care, signed off -continue soft  diet and advance as tolerated  Acute hypoxic respiratory failure/left pleural effusion/COPD -Improving -Continue Brovana, Pulmicort, pulmonary hygiene -Continue dialysis for volume control -Chest x-ray: vascular congestion, L>R. Small B/L pleural effusions -O2 prn  Atrial fibrillation Rate controlled -History of MAZE September 2018 -Continue Amiodarone, aspirin -INR 6.67, repeat 1.26, continues to be subtherapeutic -Start coumadin, continue heparin till pt is therapeutic -Metoprolol held  Supratherapeutic INR/coagulopathy Currently subtherapeutic Likely falsely elevated as repeat is 1.26 after just 1mg  of Vit K only -Despite not being on coumadin during this admission -? Accuracy of testing- as patient did develop a DVT in LUE -LFTs WNL -Discussed with hematology who recommended making sure that lab draw was from peripheral vein instead of IJ. (Discussed with phlebotomy, and vein stick was done.) -Mixing study pending -Daily INR  End-stage renal disease -Nephrology consulted and appreciated -Continue hemodialysis  Anemia of chronic disease -hemoglobin stable, continue to monitor CBC  Acute metabolic encephalopathy -resolved  Hyperkalemia -Resolved, continue to monitor BMP  Coronary artery disease -Status post CABG September 2018 -Currently denies chest pain -Metoprolol held, continue aspirin  Nausea, vomiting, diarrhea -improving -C diff antigen +, however toxin negative -GI pathogen panel negative  Hypothyroidism -Continue synthroid  Diabetes mellitus -Continue ISS and CBG monitoring  Rheumatoid arthritis -continue stress dose steroid, tapering down now on 50 mg q12, consider switching to PO pred -Orencia held since September  LUE DVT/edema  -LUE venous dupples: DVT left IJV and SVT in left cephalic vein -started on heparin, coumadin  Deconditioning  -PT recommended SNF  Goals of care -Discussed with several family members, 2 daughters and son in  law- separately on 11/17, 11/19, 11/20 -Given multiple current issues and patient's current state, GOC  discussed -Currently, he is DNR -Palliative care on board, ok with palliative medicine, but still wants to continue having dialysis for as long as his body can take. Pt doesn't qualify for hospice as he still wants dialysis  DVT Prophylaxis  Heparin, started coumadin  Code Status: DNR  Family Communication: 2 daughters, wife at bedside, all questions answered.  Disposition Plan: Admitted. SNF at discharge, awaiting placement  Consultants PCCM General surgery Nephrology Palliative care  Procedures  None  Antibiotics   Anti-infectives (From admission, onward)   Start     Dose/Rate Route Frequency Ordered Stop   11/29/16 1200  vancomycin (VANCOCIN) IVPB 1000 mg/200 mL premix  Status:  Discontinued     1,000 mg 200 mL/hr over 60 Minutes Intravenous Every T-Th-Sa (Hemodialysis) 11/28/16 0659 11/28/16 0915   11/28/16 0700  vancomycin (VANCOCIN) IVPB 1000 mg/200 mL premix     1,000 mg 200 mL/hr over 60 Minutes Intravenous STAT 11/28/16 0658 11/28/16 0900   11/27/16 1800  ceFEPIme (MAXIPIME) 1 g in dextrose 5 % 50 mL IVPB     1 g 100 mL/hr over 30 Minutes Intravenous Every 24 hours 11/14/2016 1745 12/02/16 2359   11/13/2016 1700  vancomycin (VANCOCIN) 50 mg/mL oral solution 125 mg  Status:  Discontinued     125 mg Oral Every 6 hours 12/01/2016 1638 11/27/16 1019   11/22/2016 1530  vancomycin (VANCOCIN) IVPB 1000 mg/200 mL premix  Status:  Discontinued     1,000 mg 200 mL/hr over 60 Minutes Intravenous  Once 11/14/2016 1520 11/27/2016 1521   12/04/2016 1530  ceFEPIme (MAXIPIME) 2 g in dextrose 5 % 50 mL IVPB     2 g 100 mL/hr over 30 Minutes Intravenous  Once 11/24/2016 1520 12/02/2016 1702   11/13/2016 1530  vancomycin (VANCOCIN) 2,000 mg in sodium chloride 0.9 % 500 mL IVPB     2,000 mg 250 mL/hr over 120 Minutes Intravenous  Once 12/08/2016 1521 12/08/2016 1829      Subjective:   [name] seen  and examined today.  Has no complaints today. Feels abdominal pain has improved. Denies current nausea or vomiting.  Denies dizziness, chest pain, shortness of breath.   Objective:   Vitals:   12/02/16 0300 12/02/16 0500 12/02/16 0813 12/02/16 1232  BP: (!) 175/70  92/76 (!) 152/70  Pulse: 74  76 77  Resp: 15  16 19   Temp: 99 F (37.2 C)  97.7 F (36.5 C) (!) 97.4 F (36.3 C)  TempSrc: Axillary  Oral Oral  SpO2: 94%  100% 97%  Weight:  87.2 kg (192 lb 3.9 oz)    Height:  5\' 8"  (1.727 m)      Intake/Output Summary (Last 24 hours) at 12/02/2016 1520 Last data filed at 12/02/2016 0230 Gross per 24 hour  Intake 449.5 ml  Output 0 ml  Net 449.5 ml   Filed Weights   12/01/16 0910 12/01/16 1255 12/02/16 0500  Weight: 90.4 kg (199 lb 4.7 oz) 87.7 kg (193 lb 5.5 oz) 87.2 kg (192 lb 3.9 oz)   Exam  General: Well developed, chronically ill appearing, NAD  HEENT: NCAT,  mucous membranes moist.   Cardiovascular: S1 S2 auscultated, no murmurs, irregular  Respiratory: Diminished breath sounds  Abdomen: Soft, nontender, nondistended, + bowel sounds  Extremities: warm dry without cyanosis clubbing. Trace LE edema  Neuro: AAOx3, nonfocal  Psych: pleasant, appropriate mood and affect  Data Reviewed: I have personally reviewed following labs and imaging studies  CBC: Recent Labs  Lab  12/04/2016 1455  11/28/16 0400 11/29/16 0746 11/30/16 0229 12/01/16 0501 12/02/16 0605  WBC 42.7*   < > 17.3* 13.2* 13.2* 10.4 11.9*  NEUTROABS 36.3*  --   --   --   --   --   --   HGB 10.6*   < > 9.1* 8.2* 7.9* 8.8* 9.0*  HCT 34.1*   < > 29.4* 25.9* 25.0* 27.7* 28.7*  MCV 97.7   < > 95.5 93.8 92.3 94.9 94.4  PLT 275   < > 224 189 161 216 260   < > = values in this interval not displayed.   Basic Metabolic Panel: Recent Labs  Lab 11/27/16 0320 11/28/16 0400 11/29/16 0746 11/30/16 0229 12/01/16 0501 12/02/16 0605  NA 139 137 134* 133* 134* 134*  K 4.6 3.8 4.0 3.3* 3.9 3.8  CL 103  99* 97* 97* 97* 98*  CO2 22 22 25 25 23 25   GLUCOSE 119* 158* 154* 149* 159* 147*  BUN 46* 32* 53* 18 35* 26*  CREATININE 4.79* 3.61* 4.94* 2.50* 3.94* 3.03*  CALCIUM 8.0* 8.0* 8.2* 7.8* 8.4* 8.4*  MG 1.8  --   --   --   --   --   PHOS 4.8*  --   --   --  4.6  --    GFR: Estimated Creatinine Clearance: 19.5 mL/min (A) (by C-G formula based on SCr of 3.03 mg/dL (H)). Liver Function Tests: Recent Labs  Lab 12/08/2016 1455 11/29/16 0746 12/01/16 0501  AST 31 16 13*  ALT 24 16* 14*  ALKPHOS 97 75 67  BILITOT 1.6* 0.8 1.0  PROT 6.0* 5.1* 5.4*  ALBUMIN 2.3* 2.1* 2.4*   No results for input(s): LIPASE, AMYLASE in the last 168 hours. No results for input(s): AMMONIA in the last 168 hours. Coagulation Profile: Recent Labs  Lab 11/29/16 0746 11/30/16 0229 11/30/16 0849 12/01/16 0816 12/02/16 0605  INR 4.96* 6.67* 6.13* 1.26 1.31   Cardiac Enzymes: Recent Labs  Lab 11/24/2016 2306  TROPONINI 0.21*   BNP (last 3 results) No results for input(s): PROBNP in the last 8760 hours. HbA1C: No results for input(s): HGBA1C in the last 72 hours. CBG: Recent Labs  Lab 12/01/16 1602 12/01/16 2051 12/01/16 2337 12/02/16 0346 12/02/16 0811  GLUCAP 174* 181* 166* 145* 145*   Lipid Profile: No results for input(s): CHOL, HDL, LDLCALC, TRIG, CHOLHDL, LDLDIRECT in the last 72 hours. Thyroid Function Tests: No results for input(s): TSH, T4TOTAL, FREET4, T3FREE, THYROIDAB in the last 72 hours. Anemia Panel: Recent Labs    12/01/16 0501  TIBC 137*  IRON 21*   Urine analysis:    Component Value Date/Time   COLORURINE YELLOW 10/06/2016 1353   APPEARANCEUR HAZY (A) 10/06/2016 1353   LABSPEC 1.009 10/06/2016 1353   PHURINE 6.0 10/06/2016 1353   GLUCOSEU NEGATIVE 10/06/2016 1353   HGBUR NEGATIVE 10/06/2016 1353   BILIRUBINUR NEGATIVE 10/06/2016 1353   KETONESUR NEGATIVE 10/06/2016 1353   PROTEINUR NEGATIVE 10/06/2016 1353   UROBILINOGEN 0.2 08/18/2014 1527   NITRITE NEGATIVE  10/06/2016 1353   LEUKOCYTESUR LARGE (A) 10/06/2016 1353   Sepsis Labs: @LABRCNTIP (procalcitonin:4,lacticidven:4)  ) Recent Results (from the past 240 hour(s))  Blood Culture (routine x 2)     Status: None   Collection Time: 11/23/2016  3:23 PM  Result Value Ref Range Status   Specimen Description BLOOD LEFT HAND  Final   Special Requests   Final    BOTTLES DRAWN AEROBIC ONLY Blood Culture adequate volume  Culture NO GROWTH 5 DAYS  Final   Report Status 12/01/2016 FINAL  Final  Gastrointestinal Panel by PCR , Stool     Status: None   Collection Time: 11/14/2016  4:06 PM  Result Value Ref Range Status   Campylobacter species NOT DETECTED NOT DETECTED Final   Plesimonas shigelloides NOT DETECTED NOT DETECTED Final   Salmonella species NOT DETECTED NOT DETECTED Final   Yersinia enterocolitica NOT DETECTED NOT DETECTED Final   Vibrio species NOT DETECTED NOT DETECTED Final   Vibrio cholerae NOT DETECTED NOT DETECTED Final   Enteroaggregative E coli (EAEC) NOT DETECTED NOT DETECTED Final   Enteropathogenic E coli (EPEC) NOT DETECTED NOT DETECTED Final   Enterotoxigenic E coli (ETEC) NOT DETECTED NOT DETECTED Final   Shiga like toxin producing E coli (STEC) NOT DETECTED NOT DETECTED Final   Shigella/Enteroinvasive E coli (EIEC) NOT DETECTED NOT DETECTED Final   Cryptosporidium NOT DETECTED NOT DETECTED Final   Cyclospora cayetanensis NOT DETECTED NOT DETECTED Final   Entamoeba histolytica NOT DETECTED NOT DETECTED Final   Giardia lamblia NOT DETECTED NOT DETECTED Final   Adenovirus F40/41 NOT DETECTED NOT DETECTED Final   Astrovirus NOT DETECTED NOT DETECTED Final   Norovirus GI/GII NOT DETECTED NOT DETECTED Final   Rotavirus A NOT DETECTED NOT DETECTED Final   Sapovirus (I, II, IV, and V) NOT DETECTED NOT DETECTED Final  C difficile quick scan w PCR reflex     Status: Abnormal   Collection Time: 11/20/2016  4:06 PM  Result Value Ref Range Status   C Diff antigen POSITIVE (A) NEGATIVE  Final   C Diff toxin NEGATIVE NEGATIVE Final   C Diff interpretation Results are indeterminate. See PCR results.  Final  Clostridium Difficile by PCR     Status: None   Collection Time: 11/13/2016  4:06 PM  Result Value Ref Range Status   Toxigenic C Difficile by pcr NEGATIVE NEGATIVE Final    Comment: Patient is colonized with non toxigenic C. difficile. May not need treatment unless significant symptoms are present.  MRSA PCR Screening     Status: Abnormal   Collection Time: 11/20/2016  9:34 PM  Result Value Ref Range Status   MRSA by PCR INVALID RESULTS, SPECIMEN SENT FOR CULTURE (A) NEGATIVE Final    Comment:        The GeneXpert MRSA Assay (FDA approved for NASAL specimens only), is one component of a comprehensive MRSA colonization surveillance program. It is not intended to diagnose MRSA infection nor to guide or monitor treatment for MRSA infections.   MRSA culture     Status: None   Collection Time: 11/20/2016  9:34 PM  Result Value Ref Range Status   Specimen Description NASAL SWAB  Final   Special Requests NONE  Final   Culture NO MRSA DETECTED  Final   Report Status 11/28/2016 FINAL  Final  Culture, blood (Routine X 2) w Reflex to ID Panel     Status: None   Collection Time: 11/23/2016 11:23 PM  Result Value Ref Range Status   Specimen Description BLOOD LEFT HAND  Final   Special Requests IN PEDIATRIC BOTTLE Blood Culture adequate volume  Final   Culture NO GROWTH 5 DAYS  Final   Report Status 12/02/2016 FINAL  Final      Radiology Studies: Dg Chest Port 1 View  Result Date: 11/29/2016 CLINICAL DATA:  Acute respiratory failure with hypoxia EXAM: PORTABLE CHEST 1 VIEW COMPARISON:  11/28/2016 FINDINGS: Right dialysis catheter remains in  place, unchanged. Prior CABG. Cardiomegaly. Vascular congestion and worsening interstitial and alveolar opacities in the lungs, left greater than right, favor asymmetric edema although infection cannot be completely excluded. Small left   has small bilateral effusions. IMPRESSION: Asymmetric airspace disease, left greater than right. Asymmetric edema versus infection. Small bilateral effusions. Electronically Signed   By: Rolm Baptise M.D.   On: 11/29/2016 08:37   Dg Chest Port 1 View  Result Date: 11/28/2016 CLINICAL DATA:  83 year old male with new end-stage renal disease on hemodialysis presenting with diarrhea abdominal pain fever cough and shortness of breath. Possible sepsis. EXAM: PORTABLE CHEST 1 VIEW COMPARISON:  11/27/2016 and earlier. FINDINGS: Portable AP semi upright view at 0411 hours. Stable lung volumes. Regressed veiling opacity at the left lung base with now partial visualization of the left hemidiaphragm. Stable cardiomegaly and mediastinal contours. Stable right chest dialysis catheter. Prior CABG. Stable pulmonary vascularity. The right lung otherwise remains clear. Stable visualized osseous structures. No pneumoperitoneum identified. IMPRESSION: 1. Partial regression of left pleural effusion and lung base opacity. 2. Stable vascular congestion without overt edema. 3. No new cardiopulmonary abnormality. Electronically Signed   By: Genevie Ann M.D.   On: 11/28/2016 07:27     Scheduled Meds: . amiodarone  200 mg Oral Daily  . arformoterol  15 mcg Nebulization BID  . aspirin  81 mg Oral Daily  . budesonide (PULMICORT) nebulizer solution  0.5 mg Nebulization BID  . calcitRIOL  0.25 mcg Oral Daily  . chlorhexidine  15 mL Mouth Rinse BID  . [START ON 12/09/2016] darbepoetin (ARANESP) injection - DIALYSIS  60 mcg Intravenous Q Tue-HD  . feeding supplement (GLUCERNA SHAKE)  237 mL Oral BID BM  . hydrocortisone sod succinate (SOLU-CORTEF) inj  50 mg Intravenous Q6H  . insulin aspart  0-15 Units Subcutaneous Q4H  . mouth rinse  15 mL Mouth Rinse q12n4p  . midodrine  20 mg Oral TID WC  . warfarin  2.5 mg Oral ONCE-1800  . Warfarin - Pharmacist Dosing Inpatient   Does not apply q1800   Continuous Infusions: . sodium  chloride    . ceFEPime (MAXIPIME) IV 1 g (12/01/16 1707)  . ferric gluconate (FERRLECIT/NULECIT) IV 125 mg (12/02/16 1519)  . heparin 1,400 Units/hr (12/02/16 1330)     LOS: 6 days   Time Spent in minutes   25 minutes  Alma Friendly on 12/02/2016 at 3:20 PM  Between 7am to 7pm - Pager - [phone]  After 7pm go to [email or web address] - password [email or web address]  And look for the night coverage person covering for me after hours  [facility] Office  [phone]

## 2016-12-02 NOTE — Progress Notes (Signed)
ANTICOAGULATION CONSULT NOTE - Follow Up Consult  Pharmacy Consult for Heparin and Warfarin Indication: atrial fibrillation, new LUE DVT  No Known Allergies  Patient Measurements: Height: 5\' 8"  (172.7 cm) Weight: 192 lb 3.9 oz (87.2 kg) IBW/kg (Calculated) : 68.4 Heparin Dosing Weight: 86 kg  Vital Signs: Temp: 97.7 F (36.5 C) (11/20 0813) Temp Source: Oral (11/20 0813) BP: 92/76 (11/20 0813) Pulse Rate: 76 (11/20 0813)  Labs: Recent Labs    11/30/16 0229 11/30/16 0849  12/01/16 0501 12/01/16 0816 12/01/16 0930 12/01/16 1505 12/02/16 0605  HGB 7.9*  --   --  8.8*  --   --   --  9.0*  HCT 25.0*  --   --  27.7*  --   --   --  28.7*  PLT 161  --   --  216  --   --   --  260  LABPROT 57.7* 54.0*  --   --  15.7*  --   --  16.2*  INR 6.67* 6.13*  --   --  1.26  --   --  1.31  HEPARINUNFRC 0.24* <0.10*   < > 0.46  --  >2.20* 0.76* 0.32  CREATININE 2.50*  --   --  3.94*  --   --   --  3.03*   < > = values in this interval not displayed.    Estimated Creatinine Clearance: 19.5 mL/min (A) (by C-G formula based on SCr of 3.03 mg/dL (H)).   Medications:  Scheduled:   Infusions:  . sodium chloride    . ceFEPime (MAXIPIME) IV 1 g (12/01/16 1707)  . ferric gluconate (FERRLECIT/NULECIT) IV Stopped (12/01/16 1231)  . heparin 1,300 Units/hr (12/01/16 1430)    Assessment: 83 year old male on chronic anticoagulation with Coumadin for atrial fibrillation. He also developed a LUE DVT this admission while his INR was supratherapeutic. He is currently receiving a heparin bridge to Coumadin. His heparin level is at the lower end of the therapeutic range today. His INR remains subtherapeutic. This is expected as Coumadin was held for 5 days, he received Vitamin K, and his Coumadin was just restarted yesterday. His CBC is stable with no bleeding reported.  Goal of Therapy:  Heparin level 0.3-0.7 units/ml Monitor platelets by anticoagulation protocol: Yes   Plan:  Increase  heparin to 1400 units/hr Check heparin level in 8hrs Daily heparin level and CBC Coumadin 2.5mg  today Daily PT/INR Monitor for bleeding complications  Norva Riffle 12/02/2016,9:32 AM

## 2016-12-02 NOTE — Progress Notes (Signed)
Baltazar Najjar paged to make aware that pt is having nausea and vomiting with no prns available on MAR.

## 2016-12-02 NOTE — Progress Notes (Signed)
[facility] Progress Note  Subjective: HD yest- removed 2000- tolerated well- BP did not drop   Vitals:   12/01/16 2056 12/01/16 2337 12/02/16 0300 12/02/16 0500  BP: (!) 165/72 100/72 (!) 175/70   Pulse: 74 74 74   Resp: 16 15 15    Temp: 99.2 F (37.3 C) 99.2 F (37.3 C) 99 F (37.2 C)   TempSrc: Axillary Axillary Axillary   SpO2: 91% 95% 94%   Weight:    87.2 kg (192 lb 3.9 oz)  Height:        Inpatient medications: . amiodarone  200 mg Oral Daily  . arformoterol  15 mcg Nebulization BID  . aspirin  81 mg Oral Daily  . budesonide (PULMICORT) nebulizer solution  0.5 mg Nebulization BID  . calcitRIOL  0.25 mcg Oral Daily  . chlorhexidine  15 mL Mouth Rinse BID  . [START ON 12/09/2016] darbepoetin (ARANESP) injection - DIALYSIS  60 mcg Intravenous Q Tue-HD  . hydrocortisone sod succinate (SOLU-CORTEF) inj  50 mg Intravenous Q6H  . insulin aspart  0-15 Units Subcutaneous Q4H  . mouth rinse  15 mL Mouth Rinse q12n4p  . midodrine  20 mg Oral TID WC  . Warfarin - Pharmacist Dosing Inpatient   Does not apply q1800   . sodium chloride    . ceFEPime (MAXIPIME) IV 1 g (12/01/16 1707)  . ferric gluconate (FERRLECIT/NULECIT) IV Stopped (12/01/16 1231)  . heparin 1,300 Units/hr (12/01/16 1430)   sodium chloride, RESOURCE THICKENUP CLEAR  Exam: General: Ill appearing caucasian male Neck: Supple. JVD with mild elevation. CV: S1,S2, regularly irreg. No M/G/R Lungs: clear bilat , no wheezing Abd: distended with active BS. Non tender  Ext: no LE edema  Neuro: Alert and oriented X 3. Dialysis: Maturing RUA AVF + bruit RIJ TDC drsg CDI.   Dialysis Orders: [facility] 4h   90kg  2/2.25  Hep 2000  R IJ TDC/ maturing RUA AVF (placed 10/16)    Assessment: 1.  Sepsis - ischemic SB w pneumatosis, per CCS. Vanc/cefepime- WBC  decreasing - up slightly today 2.  SOB/ pulm edema - resolved- on 2 L [place]- looks like more volume could be removed with HD but trying not to drop BP-  removed 2000 yest 3.  ESRD - recent start. TTS HD.  Plan HD M/W/Sat on holiday sched- next tomorrow 4.  Hypotension, chronic - vol down, but still dyspneic. Difficult situation as wanting to avoid hypotension (for #1).  UF today with HD as tolerates.  Adding back home midodrine 20 TID 5.  Anemia of CKD - HGB dropping into 8's.  started ESA darbe 60/wk w/ HD Monday. Last tsat here was low, 11% on Oct 24. Will replenish  6.  MBD of CKD - No binders/VDRA yet. last PTH over 400- will add calcitriol- 0.25 daily here but will convert at DC to TIW 7.  Nutrition -NPO except ice chips- now getting some clears. Albumin 2.4 8.  Afib: continue coumadin per primary/pharmacy. 9. GOC: Patient is DNR. Wants to continue HD- seems motivated to get better   Johnmichael Melhorn A  12/02/2016, 8:09 AM   Recent Labs  Lab 11/27/16 0320  11/29/16 0746 11/30/16 0229 12/01/16 0501  NA 139   < > 134* 133* 134*  K 4.6   < > 4.0 3.3* 3.9  CL 103   < > 97* 97* 97*  CO2 22   < > 25 25 23   GLUCOSE 119*   < > 154* 149*  159*  BUN 46*   < > 53* 18 35*  CREATININE 4.79*   < > 4.94* 2.50* 3.94*  CALCIUM 8.0*   < > 8.2* 7.8* 8.4*  PHOS 4.8*  --   --   --  4.6   < > = values in this interval not displayed.   Recent Labs  Lab 12/04/2016 1455 11/29/16 0746 12/01/16 0501  AST 31 16 13*  ALT 24 16* 14*  ALKPHOS 97 75 67  BILITOT 1.6* 0.8 1.0  PROT 6.0* 5.1* 5.4*  ALBUMIN 2.3* 2.1* 2.4*   Recent Labs  Lab 11/16/2016 1455  11/30/16 0229 12/01/16 0501 12/02/16 0605  WBC 42.7*   < > 13.2* 10.4 11.9*  NEUTROABS 36.3*  --   --   --   --   HGB 10.6*   < > 7.9* 8.8* 9.0*  HCT 34.1*   < > 25.0* 27.7* 28.7*  MCV 97.7   < > 92.3 94.9 94.4  PLT 275   < > 161 216 260   < > = values in this interval not displayed.   Iron/TIBC/Ferritin/ %Sat    Component Value Date/Time   IRON 21 (L) 12/01/2016 0501   TIBC 137 (L) 12/01/2016 0501   IRONPCTSAT 15 (L) 12/01/2016 0501

## 2016-12-03 ENCOUNTER — Other Ambulatory Visit: Payer: Self-pay | Admitting: Thoracic Surgery (Cardiothoracic Vascular Surgery)

## 2016-12-03 DIAGNOSIS — Z7189 Other specified counseling: Secondary | ICD-10-CM

## 2016-12-03 DIAGNOSIS — Z951 Presence of aortocoronary bypass graft: Secondary | ICD-10-CM

## 2016-12-03 LAB — BASIC METABOLIC PANEL
Anion gap: 12 (ref 5–15)
BUN: 42 mg/dL — AB (ref 6–20)
CALCIUM: 8.4 mg/dL — AB (ref 8.9–10.3)
CO2: 25 mmol/L (ref 22–32)
CREATININE: 4.11 mg/dL — AB (ref 0.61–1.24)
Chloride: 96 mmol/L — ABNORMAL LOW (ref 101–111)
GFR calc non Af Amer: 12 mL/min — ABNORMAL LOW (ref >60)
GFR, EST AFRICAN AMERICAN: 14 mL/min — AB (ref >60)
Glucose, Bld: 153 mg/dL — ABNORMAL HIGH (ref 65–99)
Potassium: 3.6 mmol/L (ref 3.5–5.1)
SODIUM: 133 mmol/L — AB (ref 135–145)

## 2016-12-03 LAB — PROTIME-INR
INR: 1.56
PROTHROMBIN TIME: 18.5 s — AB (ref 11.4–15.2)

## 2016-12-03 LAB — HEPARIN LEVEL (UNFRACTIONATED)
HEPARIN UNFRACTIONATED: 0.5 [IU]/mL (ref 0.30–0.70)
Heparin Unfractionated: 0.26 IU/mL — ABNORMAL LOW (ref 0.30–0.70)
Heparin Unfractionated: 0.33 IU/mL (ref 0.30–0.70)

## 2016-12-03 LAB — GLUCOSE, CAPILLARY
GLUCOSE-CAPILLARY: 120 mg/dL — AB (ref 65–99)
GLUCOSE-CAPILLARY: 121 mg/dL — AB (ref 65–99)
GLUCOSE-CAPILLARY: 155 mg/dL — AB (ref 65–99)
GLUCOSE-CAPILLARY: 34 mg/dL — AB (ref 65–99)
Glucose-Capillary: 158 mg/dL — ABNORMAL HIGH (ref 65–99)
Glucose-Capillary: 155 mg/dL — ABNORMAL HIGH (ref 65–99)

## 2016-12-03 LAB — CBC
HCT: 28.2 % — ABNORMAL LOW (ref 39.0–52.0)
Hemoglobin: 8.9 g/dL — ABNORMAL LOW (ref 13.0–17.0)
MCH: 29.7 pg (ref 26.0–34.0)
MCHC: 31.6 g/dL (ref 30.0–36.0)
MCV: 94 fL (ref 78.0–100.0)
Platelets: 304 10*3/uL (ref 150–400)
RBC: 3 MIL/uL — ABNORMAL LOW (ref 4.22–5.81)
RDW: 21.3 % — AB (ref 11.5–15.5)
WBC: 11.2 10*3/uL — ABNORMAL HIGH (ref 4.0–10.5)

## 2016-12-03 MED ORDER — HEPARIN SODIUM (PORCINE) 1000 UNIT/ML DIALYSIS: 20.0000 [IU]/kg | INTRAMUSCULAR | Status: DC | PRN

## 2016-12-03 MED ORDER — MIDODRINE HCL 5 MG PO TABS
ORAL_TABLET | ORAL | Status: AC
  Administered 2016-12-03: 20 mg via ORAL
  Filled 2016-12-03: qty 4

## 2016-12-03 MED ORDER — DEXTROSE 50 % IV SOLN
INTRAVENOUS | Status: AC
  Administered 2016-12-03: 50 mL
  Filled 2016-12-03: qty 50

## 2016-12-03 MED ORDER — WARFARIN SODIUM 5 MG PO TABS
2.5000 mg | ORAL_TABLET | Freq: Once | ORAL | Status: AC
  Administered 2016-12-03: 2.5 mg via ORAL
  Filled 2016-12-03 (×2): qty 1

## 2016-12-03 MED ORDER — ACETAMINOPHEN 325 MG PO TABS
650.0000 mg | ORAL_TABLET | Freq: Four times a day (QID) | ORAL | Status: DC | PRN
  Administered 2016-12-03: 650 mg via ORAL
  Filled 2016-12-03: qty 2

## 2016-12-03 MED ORDER — MIDODRINE HCL 5 MG PO TABS
ORAL_TABLET | ORAL | Status: AC
  Filled 2016-12-03: qty 1

## 2016-12-03 NOTE — Progress Notes (Signed)
ANTICOAGULATION CONSULT NOTE - Follow Up Consult  Pharmacy Consult for Heparin and Warfarin Indication: atrial fibrillation, new LUE DVT  No Known Allergies  Patient Measurements: Height: 5\' 8"  (172.7 cm) Weight: 188 lb 11.4 oz (85.6 kg) IBW/kg (Calculated) : 68.4 Heparin Dosing Weight: 86 kg  Vital Signs: Temp: 97.7 F (36.5 C) (11/21 1300) Temp Source: Oral (11/21 1300) BP: 120/52 (11/21 1300) Pulse Rate: 104 (11/21 1300)  Labs: Recent Labs    12/01/16 0501 12/01/16 0816  12/02/16 0605 12/02/16 1926 12/03/16 0356 12/03/16 1300  HGB 8.8*  --   --  9.0*  --  8.9*  --   HCT 27.7*  --   --  28.7*  --  28.2*  --   PLT 216  --   --  260  --  304  --   LABPROT  --  15.7*  --  16.2*  --  18.5*  --   INR  --  1.26  --  1.31  --  1.56  --   HEPARINUNFRC 0.46  --    < > 0.32 <0.10* 0.26* 0.50  CREATININE 3.94*  --   --  3.03*  --  4.11*  --    < > = values in this interval not displayed.    Estimated Creatinine Clearance: 14.2 mL/min (A) (by C-G formula based on SCr of 4.11 mg/dL (H)).   Medications:  Scheduled:   Infusions:  . sodium chloride    . ferric gluconate (FERRLECIT/NULECIT) IV 125 mg (12/03/16 1013)  . heparin 1,400 Units/hr (12/03/16 5885)    Assessment: 83 year old male on chronic anticoagulation with Coumadin for atrial fibrillation. He also developed a LUE DVT this admission while his INR was supratherapeutic. He is currently receiving a heparin bridge to Coumadin. His heparin level is therapeutic today. His INR remains subtherapeutic. This is expected as Coumadin was held for 5 days, he received Vitamin K, and his Coumadin was just restarted on 11/19. His CBC is stable with no bleeding reported.  Goal of Therapy:  Heparin level 0.3-0.7 units/ml Monitor platelets by anticoagulation protocol: Yes   Plan:  continue heparin at 1400 units/hr Check heparin level in 8hrs to confirm Daily heparin level and CBC Coumadin 2.5mg  today Daily PT/INR Monitor  for bleeding complications  Norva Riffle 12/03/2016,2:49 PM

## 2016-12-03 NOTE — Progress Notes (Signed)
ANTICOAGULATION CONSULT NOTE - Follow Up Consult  Pharmacy Consult for Heparin Indication: atrial fibrillation, new LUE DVT  No Known Allergies  Patient Measurements: Height: 5\' 8"  (172.7 cm) Weight: 188 lb 11.4 oz (85.6 kg) IBW/kg (Calculated) : 68.4 Heparin Dosing Weight: 86 kg  Vital Signs: Temp: 98.8 F (37.1 C) (11/21 1944) Temp Source: Oral (11/21 1944) BP: 136/73 (11/21 1607) Pulse Rate: 104 (11/21 1300)  Labs: Recent Labs    12/01/16 0501 12/01/16 0816  12/02/16 0605  12/03/16 0356 12/03/16 1300 12/03/16 1920  HGB 8.8*  --   --  9.0*  --  8.9*  --   --   HCT 27.7*  --   --  28.7*  --  28.2*  --   --   PLT 216  --   --  260  --  304  --   --   LABPROT  --  15.7*  --  16.2*  --  18.5*  --   --   INR  --  1.26  --  1.31  --  1.56  --   --   HEPARINUNFRC 0.46  --    < > 0.32   < > 0.26* 0.50 0.33  CREATININE 3.94*  --   --  3.03*  --  4.11*  --   --    < > = values in this interval not displayed.    Estimated Creatinine Clearance: 14.2 mL/min (A) (by C-G formula based on SCr of 4.11 mg/dL (H)).  Assessment: 83 year old male on chronic anticoagulation with Coumadin for atrial fibrillation. He also developed a LUE DVT this admission while his INR was supratherapeutic. He is currently receiving a heparin bridge to Coumadin.  Heparin level this evening remains therapeutic at 0.33 units/mL, though did decrease from earlier today. No bleeding noted.  Goal of Therapy:  Heparin level 0.3-0.7 units/ml Monitor platelets by anticoagulation protocol: Yes   Plan:  Increase heparin to 1500 units/hr Daily heparin level and CBC   Amalie Koran D. Eddie Payette, PharmD, BCPS Clinical Pharmacist [phone] 12/03/2016 8:01 PM

## 2016-12-03 NOTE — Progress Notes (Signed)
[facility] Progress Note  Subjective: seen on HD- no c/o's - BP is good- goal of 3 liters  Vitals:   12/03/16 0740 12/03/16 0745 12/03/16 0800 12/03/16 0830  BP: (!) 133/45 (!) 137/56 137/67 140/67  Pulse: 75 77 77 76  Resp:      Temp:      TempSrc:      SpO2:      Weight:      Height:        Inpatient medications: . amiodarone  200 mg Oral Daily  . arformoterol  15 mcg Nebulization BID  . aspirin  81 mg Oral Daily  . budesonide (PULMICORT) nebulizer solution  0.5 mg Nebulization BID  . calcitRIOL  0.25 mcg Oral Daily  . chlorhexidine  15 mL Mouth Rinse BID  . [START ON 12/09/2016] darbepoetin (ARANESP) injection - DIALYSIS  60 mcg Intravenous Q Tue-HD  . feeding supplement (GLUCERNA SHAKE)  237 mL Oral BID BM  . hydrocortisone sod succinate (SOLU-CORTEF) inj  50 mg Intravenous Q12H  . insulin aspart  0-15 Units Subcutaneous Q4H  . mouth rinse  15 mL Mouth Rinse q12n4p  . midodrine  20 mg Oral TID WC  . Warfarin - Pharmacist Dosing Inpatient   Does not apply q1800   . sodium chloride    . ferric gluconate (FERRLECIT/NULECIT) IV 125 mg (12/02/16 1519)  . heparin 1,400 Units/hr (12/03/16 0648)   sodium chloride, heparin, ondansetron (ZOFRAN) IV, RESOURCE THICKENUP CLEAR  Exam: General: Ill appearing caucasian male Neck: Supple. JVD with mild elevation. CV: S1,S2, regularly irreg. No M/G/R Lungs: clear bilat , no wheezing Abd: distended with active BS. Non tender  Ext: no LE edema  Neuro: Alert and oriented X 3. Dialysis: Maturing RUA AVF + bruit RIJ TDC drsg CDI.   Dialysis Orders: [facility] 4h   90kg  2/2.25  Hep 2000  R IJ TDC/ maturing RUA AVF (placed 10/16)    Assessment: 1.  Sepsis - ischemic SB w pneumatosis, per CCS. Vanc/cefepime- WBC  Stable- now off abx 2.  SOB/ pulm edema - improved- on 2 L [place]- looks like more volume could be removed with HD but trying not to drop BP- removed 2000 Mon- going for 3000 today 3.  ESRD - recent start. TTS  HD.  Plan HD M/W/Sat on holiday sched- next Saturday  4.  Hypotension, chronic - vol down, but still dyspneic. Difficult situation as wanting to avoid hypotension (for #1).  UF today with HD as tolerates.  Added back home midodrine 20 TID 5.  Anemia of CKD - HGB dropping into 8's.  started ESA darbe 60/wk w/ HD Monday. Last tsat here was low, 11% on Oct 24. Giving iron 6.  MBD of CKD - No binders/VDRA yet. last PTH over 400- will add calcitriol- 0.25 daily here but will convert at DC to TIW 7.  Nutrition -NPO except ice chips- now getting some clears. Albumin 2.4 8.  Afib: continue coumadin per primary/pharmacy. 9. GOC: Patient is DNR. Wants to continue HD- seems motivated to get better.  I had heard discharge soon but not sure - he is ready to go from our standpoint after HD is done  [name] A  12/03/2016, 10:18 AM   Recent Labs  Lab 11/27/16 0320  12/01/16 0501 12/02/16 0605 12/03/16 0356  NA 139   < > 134* 134* 133*  K 4.6   < > 3.9 3.8 3.6  CL 103   < > 97* 98*  96*  CO2 22   < > 23 25 25   GLUCOSE 119*   < > 159* 147* 153*  BUN 46*   < > 35* 26* 42*  CREATININE 4.79*   < > 3.94* 3.03* 4.11*  CALCIUM 8.0*   < > 8.4* 8.4* 8.4*  PHOS 4.8*  --  4.6  --   --    < > = values in this interval not displayed.   Recent Labs  Lab 12/01/2016 1455 11/29/16 0746 12/01/16 0501  AST 31 16 13*  ALT 24 16* 14*  ALKPHOS 97 75 67  BILITOT 1.6* 0.8 1.0  PROT 6.0* 5.1* 5.4*  ALBUMIN 2.3* 2.1* 2.4*   Recent Labs  Lab 11/24/2016 1455  12/01/16 0501 12/02/16 0605 12/03/16 0356  WBC 42.7*   < > 10.4 11.9* 11.2*  NEUTROABS 36.3*  --   --   --   --   HGB 10.6*   < > 8.8* 9.0* 8.9*  HCT 34.1*   < > 27.7* 28.7* 28.2*  MCV 97.7   < > 94.9 94.4 94.0  PLT 275   < > 216 260 304   < > = values in this interval not displayed.   Iron/TIBC/Ferritin/ %Sat    Component Value Date/Time   IRON 21 (L) 12/01/2016 0501   TIBC 137 (L) 12/01/2016 0501   IRONPCTSAT 15 (L) 12/01/2016 0501

## 2016-12-03 NOTE — Progress Notes (Deleted)
ANTICOAGULATION CONSULT NOTE - Follow Up Consult  Pharmacy Consult for Heparin Indication: atrial fibrillation, new LUE DVT  No Known Allergies  Patient Measurements: Height: 5\' 8"  (172.7 cm) Weight: 188 lb 11.4 oz (85.6 kg) IBW/kg (Calculated) : 68.4 Heparin Dosing Weight: 86 kg  Vital Signs: Temp: 98.8 F (37.1 C) (11/21 1944) Temp Source: Oral (11/21 1944) BP: 136/73 (11/21 1607) Pulse Rate: 104 (11/21 1300)  Labs: Recent Labs    12/01/16 0501 12/01/16 0816  12/02/16 0605  12/03/16 0356 12/03/16 1300 12/03/16 1920  HGB 8.8*  --   --  9.0*  --  8.9*  --   --   HCT 27.7*  --   --  28.7*  --  28.2*  --   --   PLT 216  --   --  260  --  304  --   --   LABPROT  --  15.7*  --  16.2*  --  18.5*  --   --   INR  --  1.26  --  1.31  --  1.56  --   --   HEPARINUNFRC 0.46  --    < > 0.32   < > 0.26* 0.50 0.33  CREATININE 3.94*  --   --  3.03*  --  4.11*  --   --    < > = values in this interval not displayed.    Estimated Creatinine Clearance: 14.2 mL/min (A) (by C-G formula based on SCr of 4.11 mg/dL (H)).  Assessment: 83 year old male on chronic anticoagulation with Coumadin for atrial fibrillation. He also developed a LUE DVT this admission while his INR was supratherapeutic. He is currently receiving a heparin bridge to Coumadin.  Heparin level this evening remains therapeutic at 0.33 units/mL. No bleeding noted.  Goal of Therapy:  Heparin level 0.3-0.7 units/ml Monitor platelets by anticoagulation protocol: Yes   Plan:  Continue heparin at 1400 units/hr Daily heparin level and CBC   Dash Cardarelli D. Jamesia Linnen, PharmD, BCPS Clinical Pharmacist [phone] 12/03/2016 8:00 PM

## 2016-12-03 NOTE — Progress Notes (Signed)
ANTICOAGULATION CONSULT NOTE - Follow Up Consult  Pharmacy Consult for heparin Indication: Afib and DVT  Labs: Recent Labs    11/30/16 1233  12/01/16 0501 12/01/16 0816  12/02/16 0605 12/02/16 1926 12/03/16 0356  HGB  --    < > 8.8*  --   --  9.0*  --  8.9*  HCT  --   --  27.7*  --   --  28.7*  --  28.2*  PLT  --   --  216  --   --  260  --  304  APTT 79.1*  --   --   --   --   --   --   --   LABPROT  --   --   --  15.7*  --  16.2*  --  18.5*  INR  --   --   --  1.26  --  1.31  --  1.56  HEPARINUNFRC  --    < > 0.46  --    < > 0.32 <0.10* 0.26*  CREATININE  --   --  3.94*  --   --  3.03*  --  4.11*   < > = values in this interval not displayed.    Assessment/Plan:  83yo male slightly subtherapeutic on heparin after resumed though was previously above goal at this rate; may need more time to accumulate. Will continue gtt at current rate and check additional level.   Wynona Neat, PharmD, BCPS  12/03/2016,5:50 AM

## 2016-12-03 NOTE — Progress Notes (Signed)
CSW following for SNF when pt medically stable.  Family wanting pt to go to [facility] is working on transferring dialysis to [facility] so that [facility] can assist with transport.  CSW will continue to follow  Jorge Ny, LCSW Clinical Social Worker [phone]

## 2016-12-03 NOTE — Consult Note (Signed)
   [facility] CM Inpatient Consult   12/03/2016  [name] 03/23/1930 [id number]  Chart reviewed for re-admission in the Medicare ACO.  Patient is 83 year old who recently returned with renal failure, ESRD on Hemodialysis [HD].  Admitted with severe sepsis and current disposition is for Skilled nursing care.  Chart reviewed and noted Palliative consult for goals of care.  Patient would like ongoing HD and current plans to go to [facility] in [place].  Will follow as needed and as appropriate for community care management needs. For questions,   Natividad Brood, RN BSN [facility] Liaison  [phone] business mobile phone Toll free office [phone]

## 2016-12-03 NOTE — Progress Notes (Signed)
SLP Cancellation Note  Patient Details Name: [name] MRN: [id number] DOB: Nov 11, 1930   Cancelled treatment:       Reason Eval/Treat Not Completed: Patient at procedure or test/unavailable(HD)   Germain Osgood 12/03/2016, 8:31 AM  Germain Osgood, M.A. CCC-SLP [phone]

## 2016-12-03 NOTE — Progress Notes (Signed)
PROGRESS NOTE    [name]  VQM:[id number] DOB: 11-02-1930 DOA: 11/15/2016 PCP: [name], MD   Chief Complaint  Patient presents with  . Hypotension  . Diarrhea    Brief Narrative:  HPI on 11/25/2016 by Dr. Aloha Gell, PCCM 83 year old male who presented to ER 11/14 with ongoing diarrhea, worsening shortness of breath, cough, fever of 103, abdominal distention and discomfort.  Dialysis yesterday was not fully completed due to hypotension.  Patient is on midodrine for chronic hypotension. Found to have concern for sepis (?abd vs pulm source) admitted for work up.  CT abd concerning for possible early distal small bowel ischemia.  C-Diff Ag + / Toxin negative.  CXR concerning for bibasilar atelectasis / effusion and patchy atelectasis.   Of note, patient had hospitalization in September for SOB, found to have multivessel CAD s/p CABG 9/32/67 complicated by renal failure and pneumothorax.  Patient discharged to rehab over the last month but discharged home on Friday. Now ESRD on HD   Interim history Admitted for severe sepsis, likely secondary to GI source. Patient also noted to have acute respiratory failure with pleural effusion which appears to be improving. Nephrology consulted for ESRD. General surgery consulted for small bowel pneumatosis secondary to ischemia and low flow state, recommending conservative management. [facility] assumed care on 11/29/2016.  Assessment & Plan   Sepsis possibly secondary to small bowel pneumatosis/ischemia vs aspiration -Patient noted to have hypotension and volume depletion -Was initially on vancomycin and cefepime, however vanc discontinued on 11/28/2016 -Cefepime discontinued on 12/02/2016, patient received 7 day course  -CT abd/pelvis: mild small bowel distention, pneumotosis, ?early distal small bowel ishcemia -General surgery consulted and appreciated, patient not a surgical candidate, continue supportive care and has signed off -continue  soft diet and advance as tolerated  Acute hypoxic respiratory failure/left pleural effusion/COPD -Improving -Continue Brovana, Pulmicort, pulmonary hygiene and flutter valve, and supplemental O2 as needed -Continue dialysis for volume control -Chest x-ray: vascular congestion, L>R. Small B/L pleural effusions  Atrial fibrillation -History of MAZE September 2018 -Continue Amiodarone, aspirin -Metoprolol held -Coumadin restarted  Supratherapeutic INR/coagulopathy -Despite not being on coumadin during this admission, INR continues to rise -? Poor nutrition vs accuracy of testing- as patient did develop a DVT in LUE -INR had risen to 6.67, was given 1mg  Vit K, INR dropped to 1.26 -Restarted on coumadin/heparin per pharmacy -aPTT mixing study shows likely an inhibitor cause -patient may need further work up as an outpatient   End-stage renal disease -Nephrology consulted and appreciated -Continue hemodialysis  Anemia of chronic disease -hemoglobin currently 8.9, continue to monitor CBC  Acute metabolic encephalopathy -resolved  Hyperkalemia -Resolved, continue to monitor BMP  Coronary artery disease -Status post CABG September 2018 -Currently denies chest pain -Metoprolol held, continue aspirin  Nausea, vomiting, diarrhea -improving -C diff antigen +, however toxin negative -GI pathogen panel negative  Hypothyroidism -Continue synthroid  Diabetes mellitus -Continue ISS and CBG monitoring  Rheumatoid arthritis -continue stress dose steroids, currently on 50mg  BID- may transition to oral  -Orencia held since September  LUE DVT/edema  -LUE venous dupples: DVT left IJV and SVT in left cephalic vein -Despite patient's INR being high, he still developed a DVT -currently on heparin/coumadin  Deconditioning  -PT recommended SNF  Goals of care -Discussed with several family members, 2 daughters and son in law- separately on 11/17 -Given multiple current issues and  patient's current state, GOC should be discussed -Currently, he is DNR, however, patient's family concerned  about overall prognosis -Palliative care consulted and appreciated -patient would like to continue hemodialysis as long as his body can tolerate it- therefore does not qualify for hospice  DVT Prophylaxis  Heparin/coumadin  Code Status: DNR  Family Communication: None at beside  Disposition Plan: Admitted. SNF at discharge  [facility] Nephrology Palliative care  Procedures  None  Antibiotics   Anti-infectives (From admission, onward)   Start     Dose/Rate Route Frequency Ordered Stop   11/29/16 1200  vancomycin (VANCOCIN) IVPB 1000 mg/200 mL premix  Status:  Discontinued     1,000 mg 200 mL/hr over 60 Minutes Intravenous Every T-Th-Sa (Hemodialysis) 11/28/16 0659 11/28/16 0915   11/28/16 0700  vancomycin (VANCOCIN) IVPB 1000 mg/200 mL premix     1,000 mg 200 mL/hr over 60 Minutes Intravenous STAT 11/28/16 0658 11/28/16 0900   11/27/16 1800  ceFEPIme (MAXIPIME) 1 g in dextrose 5 % 50 mL IVPB     1 g 100 mL/hr over 30 Minutes Intravenous Every 24 hours 12/09/2016 1745 12/02/16 2359   11/15/2016 1700  vancomycin (VANCOCIN) 50 mg/mL oral solution 125 mg  Status:  Discontinued     125 mg Oral Every 6 hours 11/14/2016 1638 11/27/16 1019   11/22/2016 1530  vancomycin (VANCOCIN) IVPB 1000 mg/200 mL premix  Status:  Discontinued     1,000 mg 200 mL/hr over 60 Minutes Intravenous  Once 11/25/2016 1520 12/01/2016 1521   12/12/2016 1530  ceFEPIme (MAXIPIME) 2 g in dextrose 5 % 50 mL IVPB     2 g 100 mL/hr over 30 Minutes Intravenous  Once 11/14/2016 1520 11/24/2016 1702   12/03/2016 1530  vancomycin (VANCOCIN) 2,000 mg in sodium chloride 0.9 % 500 mL IVPB     2,000 mg 250 mL/hr over 120 Minutes Intravenous  Once 11/21/2016 1521 11/15/2016 1829      Subjective:   [name] seen and examined today in hemodialysis.  He has no complaints today. Would like to eat something.  Denies chest pain, shortness of breath, dizziness, headache. Feels his abdominal pain has improved. States he had some spitting up of mucus last night.   Objective:   Vitals:   12/03/16 0740 12/03/16 0745 12/03/16 0800 12/03/16 0830  BP: (!) 133/45 (!) 137/56 137/67 140/67  Pulse: 75 77 77 76  Resp:      Temp:      TempSrc:      SpO2:      Weight:      Height:        Intake/Output Summary (Last 24 hours) at 12/03/2016 1024 Last data filed at 12/03/2016 0250 Gross per 24 hour  Intake 899.66 ml  Output 2 ml  Net 897.66 ml   Filed Weights   12/02/16 0500 12/03/16 0500 12/03/16 0730  Weight: 87.2 kg (192 lb 3.9 oz) 87.6 kg (193 lb 2 oz) 88.6 kg (195 lb 5.2 oz)   Exam  General: Well developed, chronically ill appearing, NAD  HEENT: NCAT, mucous membranes moist.   Cardiovascular: S1 S2 auscultated, no murmurs  Respiratory: Diminished breath sounds, rhonchi  Abdomen: Soft, nontender, nondistended, + bowel sounds  Extremities: warm dry without cyanosis clubbing. Trace LE edema  Neuro: AAOx3, nonfocal  Psych: Normal affect and demeanor with intact judgement and insight  Data Reviewed: I have personally reviewed following labs and imaging studies  CBC: Recent Labs  Lab 12/02/2016 1455  11/29/16 0746 11/30/16 0229 12/01/16 0501 12/02/16 0605 12/03/16 0356  WBC 42.7*   < >  13.2* 13.2* 10.4 11.9* 11.2*  NEUTROABS 36.3*  --   --   --   --   --   --   HGB 10.6*   < > 8.2* 7.9* 8.8* 9.0* 8.9*  HCT 34.1*   < > 25.9* 25.0* 27.7* 28.7* 28.2*  MCV 97.7   < > 93.8 92.3 94.9 94.4 94.0  PLT 275   < > 189 161 216 260 304   < > = values in this interval not displayed.   Basic Metabolic Panel: Recent Labs  Lab 11/27/16 0320  11/29/16 0746 11/30/16 0229 12/01/16 0501 12/02/16 0605 12/03/16 0356  NA 139   < > 134* 133* 134* 134* 133*  K 4.6   < > 4.0 3.3* 3.9 3.8 3.6  CL 103   < > 97* 97* 97* 98* 96*  CO2 22   < > 25 25 23 25 25   GLUCOSE 119*   < > 154* 149* 159* 147*  153*  BUN 46*   < > 53* 18 35* 26* 42*  CREATININE 4.79*   < > 4.94* 2.50* 3.94* 3.03* 4.11*  CALCIUM 8.0*   < > 8.2* 7.8* 8.4* 8.4* 8.4*  MG 1.8  --   --   --   --   --   --   PHOS 4.8*  --   --   --  4.6  --   --    < > = values in this interval not displayed.   GFR: Estimated Creatinine Clearance: 14.5 mL/min (A) (by C-G formula based on SCr of 4.11 mg/dL (H)). Liver Function Tests: Recent Labs  Lab 11/28/2016 1455 11/29/16 0746 12/01/16 0501  AST 31 16 13*  ALT 24 16* 14*  ALKPHOS 97 75 67  BILITOT 1.6* 0.8 1.0  PROT 6.0* 5.1* 5.4*  ALBUMIN 2.3* 2.1* 2.4*   No results for input(s): LIPASE, AMYLASE in the last 168 hours. No results for input(s): AMMONIA in the last 168 hours. Coagulation Profile: Recent Labs  Lab 11/30/16 0229 11/30/16 0849 12/01/16 0816 12/02/16 0605 12/03/16 0356  INR 6.67* 6.13* 1.26 1.31 1.56   Cardiac Enzymes: Recent Labs  Lab 12/01/2016 2306  TROPONINI 0.21*   BNP (last 3 results) No results for input(s): PROBNP in the last 8760 hours. HbA1C: No results for input(s): HGBA1C in the last 72 hours. CBG: Recent Labs  Lab 12/02/16 1219 12/02/16 1549 12/02/16 1951 12/02/16 2331 12/03/16 0332  GLUCAP 179* 175* 230* 198* 158*   Lipid Profile: No results for input(s): CHOL, HDL, LDLCALC, TRIG, CHOLHDL, LDLDIRECT in the last 72 hours. Thyroid Function Tests: No results for input(s): TSH, T4TOTAL, FREET4, T3FREE, THYROIDAB in the last 72 hours. Anemia Panel: Recent Labs    12/01/16 0501  TIBC 137*  IRON 21*   Urine analysis:    Component Value Date/Time   COLORURINE YELLOW 10/06/2016 1353   APPEARANCEUR HAZY (A) 10/06/2016 1353   LABSPEC 1.009 10/06/2016 1353   PHURINE 6.0 10/06/2016 1353   GLUCOSEU NEGATIVE 10/06/2016 1353   HGBUR NEGATIVE 10/06/2016 1353   BILIRUBINUR NEGATIVE 10/06/2016 1353   KETONESUR NEGATIVE 10/06/2016 1353   PROTEINUR NEGATIVE 10/06/2016 1353   UROBILINOGEN 0.2 08/18/2014 1527   NITRITE NEGATIVE  10/06/2016 1353   LEUKOCYTESUR LARGE (A) 10/06/2016 1353   Sepsis Labs: @LABRCNTIP (procalcitonin:4,lacticidven:4)  ) Recent Results (from the past 240 hour(s))  Blood Culture (routine x 2)     Status: None   Collection Time: 11/25/2016  3:23 PM  Result Value Ref Range  Status   Specimen Description BLOOD LEFT HAND  Final   Special Requests   Final    BOTTLES DRAWN AEROBIC ONLY Blood Culture adequate volume   Culture NO GROWTH 5 DAYS  Final   Report Status 12/01/2016 FINAL  Final  Gastrointestinal Panel by PCR , Stool     Status: None   Collection Time: 11/25/2016  4:06 PM  Result Value Ref Range Status   Campylobacter species NOT DETECTED NOT DETECTED Final   Plesimonas shigelloides NOT DETECTED NOT DETECTED Final   Salmonella species NOT DETECTED NOT DETECTED Final   Yersinia enterocolitica NOT DETECTED NOT DETECTED Final   Vibrio species NOT DETECTED NOT DETECTED Final   Vibrio cholerae NOT DETECTED NOT DETECTED Final   Enteroaggregative E coli (EAEC) NOT DETECTED NOT DETECTED Final   Enteropathogenic E coli (EPEC) NOT DETECTED NOT DETECTED Final   Enterotoxigenic E coli (ETEC) NOT DETECTED NOT DETECTED Final   Shiga like toxin producing E coli (STEC) NOT DETECTED NOT DETECTED Final   Shigella/Enteroinvasive E coli (EIEC) NOT DETECTED NOT DETECTED Final   Cryptosporidium NOT DETECTED NOT DETECTED Final   Cyclospora cayetanensis NOT DETECTED NOT DETECTED Final   Entamoeba histolytica NOT DETECTED NOT DETECTED Final   Giardia lamblia NOT DETECTED NOT DETECTED Final   Adenovirus F40/41 NOT DETECTED NOT DETECTED Final   Astrovirus NOT DETECTED NOT DETECTED Final   Norovirus GI/GII NOT DETECTED NOT DETECTED Final   Rotavirus A NOT DETECTED NOT DETECTED Final   Sapovirus (I, II, IV, and V) NOT DETECTED NOT DETECTED Final  C difficile quick scan w PCR reflex     Status: Abnormal   Collection Time: 11/13/2016  4:06 PM  Result Value Ref Range Status   C Diff antigen POSITIVE (A) NEGATIVE  Final   C Diff toxin NEGATIVE NEGATIVE Final   C Diff interpretation Results are indeterminate. See PCR results.  Final  Clostridium Difficile by PCR     Status: None   Collection Time: 11/23/2016  4:06 PM  Result Value Ref Range Status   Toxigenic C Difficile by pcr NEGATIVE NEGATIVE Final    Comment: Patient is colonized with non toxigenic C. difficile. May not need treatment unless significant symptoms are present.  MRSA PCR Screening     Status: Abnormal   Collection Time: 11/16/2016  9:34 PM  Result Value Ref Range Status   MRSA by PCR INVALID RESULTS, SPECIMEN SENT FOR CULTURE (A) NEGATIVE Final    Comment:        The GeneXpert MRSA Assay (FDA approved for NASAL specimens only), is one component of a comprehensive MRSA colonization surveillance program. It is not intended to diagnose MRSA infection nor to guide or monitor treatment for MRSA infections.   MRSA culture     Status: None   Collection Time: 12/11/2016  9:34 PM  Result Value Ref Range Status   Specimen Description NASAL SWAB  Final   Special Requests NONE  Final   Culture NO MRSA DETECTED  Final   Report Status 11/28/2016 FINAL  Final  Culture, blood (Routine X 2) w Reflex to ID Panel     Status: None   Collection Time: 11/25/2016 11:23 PM  Result Value Ref Range Status   Specimen Description BLOOD LEFT HAND  Final   Special Requests IN PEDIATRIC BOTTLE Blood Culture adequate volume  Final   Culture NO GROWTH 5 DAYS  Final   Report Status 12/02/2016 FINAL  Final      Radiology Studies: Dg  Chest Port 1 View  Result Date: 11/29/2016 CLINICAL DATA:  Acute respiratory failure with hypoxia EXAM: PORTABLE CHEST 1 VIEW COMPARISON:  11/28/2016 FINDINGS: Right dialysis catheter remains in place, unchanged. Prior CABG. Cardiomegaly. Vascular congestion and worsening interstitial and alveolar opacities in the lungs, left greater than right, favor asymmetric edema although infection cannot be completely excluded. Small left   has small bilateral effusions. IMPRESSION: Asymmetric airspace disease, left greater than right. Asymmetric edema versus infection. Small bilateral effusions. Electronically Signed   By: Rolm Baptise M.D.   On: 11/29/2016 08:37   Dg Chest Port 1 View  Result Date: 11/28/2016 CLINICAL DATA:  83 year old male with new end-stage renal disease on hemodialysis presenting with diarrhea abdominal pain fever cough and shortness of breath. Possible sepsis. EXAM: PORTABLE CHEST 1 VIEW COMPARISON:  11/27/2016 and earlier. FINDINGS: Portable AP semi upright view at 0411 hours. Stable lung volumes. Regressed veiling opacity at the left lung base with now partial visualization of the left hemidiaphragm. Stable cardiomegaly and mediastinal contours. Stable right chest dialysis catheter. Prior CABG. Stable pulmonary vascularity. The right lung otherwise remains clear. Stable visualized osseous structures. No pneumoperitoneum identified. IMPRESSION: 1. Partial regression of left pleural effusion and lung base opacity. 2. Stable vascular congestion without overt edema. 3. No new cardiopulmonary abnormality. Electronically Signed   By: Genevie Ann M.D.   On: 11/28/2016 07:27     Scheduled Meds: . amiodarone  200 mg Oral Daily  . arformoterol  15 mcg Nebulization BID  . aspirin  81 mg Oral Daily  . budesonide (PULMICORT) nebulizer solution  0.5 mg Nebulization BID  . calcitRIOL  0.25 mcg Oral Daily  . chlorhexidine  15 mL Mouth Rinse BID  . [START ON 12/09/2016] darbepoetin (ARANESP) injection - DIALYSIS  60 mcg Intravenous Q Tue-HD  . feeding supplement (GLUCERNA SHAKE)  237 mL Oral BID BM  . hydrocortisone sod succinate (SOLU-CORTEF) inj  50 mg Intravenous Q12H  . insulin aspart  0-15 Units Subcutaneous Q4H  . mouth rinse  15 mL Mouth Rinse q12n4p  . midodrine  20 mg Oral TID WC  . Warfarin - Pharmacist Dosing Inpatient   Does not apply q1800   Continuous Infusions: . sodium chloride    . ferric gluconate  (FERRLECIT/NULECIT) IV 125 mg (12/02/16 1519)  . heparin 1,400 Units/hr (12/03/16 5035)     LOS: 7 days   Time Spent in minutes   30 minutes  Kenadi Miltner D.O. on 12/03/2016 at 10:24 AM  Between 7am to 7pm - Pager - [phone]  After 7pm go to [email or web address] - password [email or web address]  And look for the night coverage person covering for me after hours  [facility] Office  [phone]

## 2016-12-04 ENCOUNTER — Inpatient Hospital Stay (HOSPITAL_COMMUNITY): Payer: Medicare Other

## 2016-12-04 LAB — BASIC METABOLIC PANEL
ANION GAP: 11 (ref 5–15)
Anion gap: 12 (ref 5–15)
BUN: 25 mg/dL — ABNORMAL HIGH (ref 6–20)
BUN: 34 mg/dL — AB (ref 6–20)
CALCIUM: 8 mg/dL — AB (ref 8.9–10.3)
CHLORIDE: 98 mmol/L — AB (ref 101–111)
CO2: 25 mmol/L (ref 22–32)
CO2: 25 mmol/L (ref 22–32)
CREATININE: 3.53 mg/dL — AB (ref 0.61–1.24)
Calcium: 8 mg/dL — ABNORMAL LOW (ref 8.9–10.3)
Chloride: 99 mmol/L — ABNORMAL LOW (ref 101–111)
Creatinine, Ser: 2.95 mg/dL — ABNORMAL HIGH (ref 0.61–1.24)
GFR calc Af Amer: 17 mL/min — ABNORMAL LOW (ref >60)
GFR, EST AFRICAN AMERICAN: 21 mL/min — AB (ref >60)
GFR, EST NON AFRICAN AMERICAN: 18 mL/min — AB (ref >60)
GFR, EST NON AFRICAN AMERICAN: 15 mL/min — AB (ref >60)
GLUCOSE: 158 mg/dL — AB (ref 65–99)
Glucose, Bld: 107 mg/dL — ABNORMAL HIGH (ref 65–99)
POTASSIUM: 4.6 mmol/L (ref 3.5–5.1)
Potassium: 4.6 mmol/L (ref 3.5–5.1)
SODIUM: 135 mmol/L (ref 135–145)
Sodium: 135 mmol/L (ref 135–145)

## 2016-12-04 LAB — CBC
HCT: 26.1 % — ABNORMAL LOW (ref 39.0–52.0)
HEMATOCRIT: 28.8 % — AB (ref 39.0–52.0)
Hemoglobin: 9.1 g/dL — ABNORMAL LOW (ref 13.0–17.0)
Hemoglobin: 8.1 g/dL — ABNORMAL LOW (ref 13.0–17.0)
MCH: 30.4 pg (ref 26.0–34.0)
MCH: 29.6 pg (ref 26.0–34.0)
MCHC: 31.6 g/dL (ref 30.0–36.0)
MCHC: 31 g/dL (ref 30.0–36.0)
MCV: 96.3 fL (ref 78.0–100.0)
MCV: 95.3 fL (ref 78.0–100.0)
PLATELETS: 265 10*3/uL (ref 150–400)
Platelets: 273 10*3/uL (ref 150–400)
RBC: 2.99 MIL/uL — ABNORMAL LOW (ref 4.22–5.81)
RBC: 2.74 MIL/uL — AB (ref 4.22–5.81)
RDW: 22.2 % — AB (ref 11.5–15.5)
RDW: 22.4 % — ABNORMAL HIGH (ref 11.5–15.5)
WBC: 27.4 10*3/uL — ABNORMAL HIGH (ref 4.0–10.5)
WBC: 22.5 10*3/uL — ABNORMAL HIGH (ref 4.0–10.5)

## 2016-12-04 LAB — PROTIME-INR
INR: 2.26
INR: 2.84
Prothrombin Time: 24.8 seconds — ABNORMAL HIGH (ref 11.4–15.2)
Prothrombin Time: 29.6 seconds — ABNORMAL HIGH (ref 11.4–15.2)

## 2016-12-04 LAB — GLUCOSE, CAPILLARY
GLUCOSE-CAPILLARY: 195 mg/dL — AB (ref 65–99)
GLUCOSE-CAPILLARY: 185 mg/dL — AB (ref 65–99)
Glucose-Capillary: 105 mg/dL — ABNORMAL HIGH (ref 65–99)
Glucose-Capillary: 159 mg/dL — ABNORMAL HIGH (ref 65–99)

## 2016-12-04 LAB — HEPARIN LEVEL (UNFRACTIONATED): HEPARIN UNFRACTIONATED: 0.37 [IU]/mL (ref 0.30–0.70)

## 2016-12-04 MED ORDER — WARFARIN SODIUM 1 MG PO TABS
1.0000 mg | ORAL_TABLET | Freq: Once | ORAL | Status: AC
  Administered 2016-12-04: 1 mg via ORAL
  Filled 2016-12-04: qty 1

## 2016-12-04 MED ORDER — LEVOTHYROXINE SODIUM 25 MCG PO TABS
25.0000 ug | ORAL_TABLET | Freq: Every day | ORAL | Status: DC
  Administered 2016-12-05: 25 ug via ORAL
  Filled 2016-12-04: qty 1

## 2016-12-04 MED ORDER — GUAIFENESIN-DM 100-10 MG/5ML PO SYRP
5.0000 mL | ORAL_SOLUTION | ORAL | Status: DC | PRN
  Administered 2016-12-04: 5 mL via ORAL
  Filled 2016-12-04: qty 5

## 2016-12-04 MED ORDER — PROMETHAZINE HCL 25 MG/ML IJ SOLN
12.5000 mg | Freq: Four times a day (QID) | INTRAMUSCULAR | Status: DC | PRN
  Administered 2016-12-04 – 2016-12-05 (×3): 12.5 mg via INTRAVENOUS
  Filled 2016-12-04 (×3): qty 1

## 2016-12-04 NOTE — Progress Notes (Signed)
[name]  Subjective: HD yest- removed 3 liters tolerated well- no new c/os today   Vitals:   12/04/16 0400 12/04/16 0407 12/04/16 0748 12/04/16 0800  BP: (!) 128/46  (!) 129/49   Pulse: 77  87   Resp: 13  (!) 25   Temp: 98.6 F (37 C) 98.6 F (37 C) 97.6 F (36.4 C)   TempSrc: Axillary Axillary Oral   SpO2: 100%  100% 100%  Weight:  84.6 kg (186 lb 8.2 oz)    Height:        Inpatient medications: . amiodarone  200 mg Oral Daily  . arformoterol  15 mcg Nebulization BID  . aspirin  81 mg Oral Daily  . budesonide (PULMICORT) nebulizer solution  0.5 mg Nebulization BID  . calcitRIOL  0.25 mcg Oral Daily  . chlorhexidine  15 mL Mouth Rinse BID  . [START ON 12/09/2016] darbepoetin (ARANESP) injection - DIALYSIS  60 mcg Intravenous Q Tue-HD  . feeding supplement (GLUCERNA SHAKE)  237 mL Oral BID BM  . hydrocortisone sod succinate (SOLU-CORTEF) inj  50 mg Intravenous Q12H  . insulin aspart  0-15 Units Subcutaneous Q4H  . mouth rinse  15 mL Mouth Rinse q12n4p  . midodrine  20 mg Oral TID WC  . Warfarin - Pharmacist Dosing Inpatient   Does not apply q1800   . sodium chloride    . ferric gluconate (FERRLECIT/NULECIT) IV 125 mg (12/03/16 1013)  . heparin 1,400 Units/hr (12/04/16 0053)   sodium chloride, acetaminophen, ondansetron (ZOFRAN) IV, RESOURCE THICKENUP CLEAR  Exam: General: Ill appearing caucasian male Neck: Supple. JVD with mild elevation. CV: S1,S2, regularly irreg. No M/G/R Lungs: clear bilat , no wheezing Abd: distended with active BS. Non tender  Ext: no LE edema  Neuro: Alert and oriented X 3. Dialysis: Maturing RUA AVF + bruit RIJ TDC drsg CDI.   Dialysis Orders: [facility] 4h   90kg  2/2.25  Hep 2000  R IJ TDC/ maturing RUA AVF (placed 10/16)    Assessment: 1.  Sepsis - ischemic SB w pneumatosis, per [facility]. Vanc/cefepime- WBC  Stable- now off abx- WBC doubled overnight - hopefully due to steroids ?  2.  SOB/ pulm edema -  improved- on 2 L [place]- looks like more volume could be removed with HD but trying not to drop BP- removed 3000 yest 3.  ESRD - recent start. TTS HD.  Plan HD M/W/Sat on holiday sched- next Saturday.  I guess plans are to transfer him from [place] to [place] if goes to [facility]- I do not know the status of this  4.  Hypotension, chronic - vol down, but still dyspneic. Difficult situation as wanting to avoid hypotension (for #1).  UF  with HD as tolerates.  Added back home midodrine 20 TID 5.  Anemia of CKD - HGB dropping into 8's- now 9.1.  started ESA darbe 60/wk w/ HD Monday. Last tsat here was low, 11% on Oct 24. Giving iron as well 6.  MBD of CKD - No binders/VDRA yet. last PTH over 400- have added calcitriol- 0.25 daily here but will convert at DC to TIW 7.  Nutrition -NPO except ice chips- now getting some clears. Albumin 2.4 8.  Afib: continue coumadin per primary/pharmacy. 9. GOC: Patient is DNR. Wants to continue HD- seems motivated to get better.  I had heard discharge soon but not sure - he is ready to go from our standpoint   [name] A  12/04/2016, 8:14  AM   Recent Labs  Lab 12/01/16 0501 12/02/16 0605 12/03/16 0356 12/04/16 0329  NA 134* 134* 133* 135  K 3.9 3.8 3.6 4.6  CL 97* 98* 96* 99*  CO2 23 25 25 25   GLUCOSE 159* 147* 153* 107*  BUN 35* 26* 42* 25*  CREATININE 3.94* 3.03* 4.11* 2.95*  CALCIUM 8.4* 8.4* 8.4* 8.0*  PHOS 4.6  --   --   --    Recent Labs  Lab 11/29/16 0746 12/01/16 0501  AST 16 13*  ALT 16* 14*  ALKPHOS 75 67  BILITOT 0.8 1.0  PROT 5.1* 5.4*  ALBUMIN 2.1* 2.4*   Recent Labs  Lab 12/02/16 0605 12/03/16 0356 12/04/16 0329  WBC 11.9* 11.2* 27.4*  HGB 9.0* 8.9* 9.1*  HCT 28.7* 28.2* 28.8*  MCV 94.4 94.0 96.3  PLT 260 304 273   Iron/TIBC/Ferritin/ %Sat    Component Value Date/Time   IRON 21 (L) 12/01/2016 0501   TIBC 137 (L) 12/01/2016 0501   IRONPCTSAT 15 (L) 12/01/2016 0501

## 2016-12-04 NOTE — Progress Notes (Signed)
ANTICOAGULATION CONSULT NOTE - Follow Up Consult  Pharmacy Consult for Heparin and Coumadin Indication: atrial fibrillation and new LUE DVT  No Known Allergies  Patient Measurements: Height: 5\' 8"  (172.7 cm) Weight: 186 lb 8.2 oz (84.6 kg) IBW/kg (Calculated) : 68.4 Heparin Dosing Weight: 84.6 kg  Vital Signs: Temp: 97.5 F (36.4 C) (11/22 1148) Temp Source: Oral (11/22 1148) BP: 144/49 (11/22 1214) Pulse Rate: 93 (11/22 1214)  Labs: Recent Labs    12/03/16 0356 12/03/16 1300 12/03/16 1920 12/04/16 0329 12/04/16 1129  HGB 8.9*  --   --  9.1* 8.1*  HCT 28.2*  --   --  28.8* 26.1*  PLT 304  --   --  273 265  LABPROT 18.5*  --   --  24.8* 29.6*  INR 1.56  --   --  2.26 2.84  HEPARINUNFRC 0.26* 0.50 0.33 0.37  --   CREATININE 4.11*  --   --  2.95* 3.53*    Estimated Creatinine Clearance: 16.5 mL/min (A) (by C-G formula based on SCr of 3.53 mg/dL (H)).  Assessment: 83 year old male on chronic anticoagulation with Coumadin for atrial fibrillation. He also developed a LUE DVT this admission while his INR was supratherapeutic. He is currently receiving a heparin bridge to Coumadin.    Heparin level is therapeutic (0.37) on 1500 units/hr.   INR up to 2.26 today, after Coumadin 2.5 mg x 3 days. First day therapeutic again. Significant increase in INR from 1.56 yesterday.  Spoke with Dr. Ree Kida, labs were repeated.  INR now up to 2.84.      PTA Coumadin regimen:  2.5 mg daily.  INR 2.43 on admit 12/06/2016.  Goal of Therapy:  INR 2-3 Heparin level 0.3-0.7 units/ml Monitor platelets by anticoagulation protocol: Yes   Plan:   Continue heparin drip at 1500 units/hr  Reduce today's Coumadin dose to 1 mg.  Daily heparin level, PT/INR and CBC.  Arty Baumgartner, [place] Pager: [phone] 12/04/2016,3:34 PM

## 2016-12-04 NOTE — Progress Notes (Signed)
PROGRESS NOTE    [name]  OFB:[id number] DOB: 09/09/1930 DOA: 12/11/2016 PCP: [name], MD   Chief Complaint  Patient presents with  . Hypotension  . Diarrhea    Brief Narrative:  HPI on 12/04/2016 by Dr. Aloha Gell, PCCM 83 year old male who presented to ER 11/14 with ongoing diarrhea, worsening shortness of breath, cough, fever of 103, abdominal distention and discomfort.  Dialysis yesterday was not fully completed due to hypotension.  Patient is on midodrine for chronic hypotension. Found to have concern for sepis (?abd vs pulm source) admitted for work up.  CT abd concerning for possible early distal small bowel ischemia.  C-Diff Ag + / Toxin negative.  CXR concerning for bibasilar atelectasis / effusion and patchy atelectasis.   Of note, patient had hospitalization in September for SOB, found to have multivessel CAD s/p CABG 7/82/42 complicated by renal failure and pneumothorax.  Patient discharged to rehab over the last month but discharged home on Friday. Now ESRD on HD   Interim history Admitted for severe sepsis, likely secondary to GI source. Patient also noted to have acute respiratory failure with pleural effusion which appears to be improving. Nephrology consulted for ESRD. General surgery consulted for small bowel pneumatosis secondary to ischemia and low flow state, recommending conservative management. [facility] assumed care on 11/29/2016.  Assessment & Plan   Sepsis possibly secondary to small bowel pneumatosis/ischemia vs aspiration -Patient noted to have hypotension and volume depletion -Was initially on vancomycin and cefepime, however vanc discontinued on 11/28/2016 -Cefepime discontinued on 12/02/2016, patient received 7 day course  -CT abd/pelvis: mild small bowel distention, pneumotosis, ?early distal small bowel ishcemia -General surgery consulted and appreciated, patient not a surgical candidate, continue supportive care and has signed off -continue  soft diet and advance as tolerated  Leukocytosis  -patient completed course of antibiotics -currently no fever, or new complaints -steroids being weaned -WBC increased today to 27.4 from 11.2 (question if this accurate) -will repeat CBC and monitor -obtain repeat CXR/abd xray  Acute hypoxic respiratory failure/left pleural effusion/COPD -Improving -Continue Brovana, Pulmicort, pulmonary hygiene and flutter valve, and supplemental O2 as needed -Continue dialysis for volume control -Chest x-ray: vascular congestion, L>R. Small B/L pleural effusions  Atrial fibrillation -History of MAZE September 2018 -Continue Amiodarone, aspirin -Metoprolol held -Coumadin restarted  Supratherapeutic INR/coagulopathy -Despite not being on coumadin during this admission, INR continues to rise -? Poor nutrition vs accuracy of testing- as patient did develop a DVT in LUE -INR had risen to 6.67, was given 1mg  Vit K, INR dropped to 1.26 -Restarted on coumadin/heparin per pharmacy, INR today 2.26 -aPTT mixing study shows likely an inhibitor cause -patient may need further work up as an outpatient   End-stage renal disease -Nephrology consulted and appreciated -Continue hemodialysis  Anemia of chronic disease -hemoglobin currently 9.1, continue to monitor CBC  Acute metabolic encephalopathy -resolved  Hyperkalemia -Resolved, continue to monitor BMP  Coronary artery disease -Status post CABG September 2018 -Currently denies chest pain -Metoprolol held, continue aspirin  Nausea, vomiting, diarrhea -improving -C diff antigen +, however toxin negative -GI pathogen panel negative  Hypothyroidism -Continue synthroid  Diabetes mellitus -Continue ISS and CBG monitoring  Rheumatoid arthritis -continue stress dose steroids, currently on 50mg  BID- will continue to wean -Orencia held since September  LUE DVT/edema  -LUE venous dupples: DVT left IJV and SVT in left cephalic vein -Despite  patient's INR being high, he still developed a DVT -currently on heparin/coumadin  Deconditioning  -PT recommended  SNF  Goals of care -Discussed with several family members, 2 daughters and son in law- separately on 11/17 -Given multiple current issues and patient's current state, GOC should be discussed -Currently, he is DNR, however, patient's family concerned about overall prognosis -Palliative care consulted and appreciated -patient would like to continue hemodialysis as long as his body can tolerate it- therefore does not qualify for hospice  DVT Prophylaxis  Heparin/coumadin  Code Status: DNR  Family Communication: None at beside  Disposition Plan: Admitted. SNF at discharge  [facility] Nephrology Palliative care  Procedures  None  Antibiotics   Anti-infectives (From admission, onward)   Start     Dose/Rate Route Frequency Ordered Stop   11/29/16 1200  vancomycin (VANCOCIN) IVPB 1000 mg/200 mL premix  Status:  Discontinued     1,000 mg 200 mL/hr over 60 Minutes Intravenous Every T-Th-Sa (Hemodialysis) 11/28/16 0659 11/28/16 0915   11/28/16 0700  vancomycin (VANCOCIN) IVPB 1000 mg/200 mL premix     1,000 mg 200 mL/hr over 60 Minutes Intravenous STAT 11/28/16 0658 11/28/16 0900   11/27/16 1800  ceFEPIme (MAXIPIME) 1 g in dextrose 5 % 50 mL IVPB     1 g 100 mL/hr over 30 Minutes Intravenous Every 24 hours 11/15/2016 1745 12/02/16 2359   12/11/2016 1700  vancomycin (VANCOCIN) 50 mg/mL oral solution 125 mg  Status:  Discontinued     125 mg Oral Every 6 hours 12/08/2016 1638 11/27/16 1019   12/02/2016 1530  vancomycin (VANCOCIN) IVPB 1000 mg/200 mL premix  Status:  Discontinued     1,000 mg 200 mL/hr over 60 Minutes Intravenous  Once 11/23/2016 1520 11/20/2016 1521   12/10/2016 1530  ceFEPIme (MAXIPIME) 2 g in dextrose 5 % 50 mL IVPB     2 g 100 mL/hr over 30 Minutes Intravenous  Once 11/23/2016 1520 11/14/2016 1702   12/04/2016 1530  vancomycin (VANCOCIN) 2,000 mg  in sodium chloride 0.9 % 500 mL IVPB     2,000 mg 250 mL/hr over 120 Minutes Intravenous  Once 12/03/2016 1521 11/14/2016 1829      Subjective:   [name] seen and examined today.  Has no complaints today. Denies dizziness, chest pain, shortness of breath, abdominal pain, N/V/D/C, new weakness, numbess, tingling.   Objective:   Vitals:   12/04/16 0400 12/04/16 0407 12/04/16 0748 12/04/16 0800  BP: (!) 128/46  (!) 129/49 (!) 141/56  Pulse: 77  87 92  Resp: 13  (!) 25 13  Temp: 98.6 F (37 C) 98.6 F (37 C) 97.6 F (36.4 C)   TempSrc: Axillary Axillary Oral   SpO2: 100%  100% 100%  Weight:  84.6 kg (186 lb 8.2 oz)    Height:        Intake/Output Summary (Last 24 hours) at 12/04/2016 1111 Last data filed at 12/04/2016 0815 Gross per 24 hour  Intake 581.84 ml  Output 3000 ml  Net -2418.16 ml   Filed Weights   12/03/16 0730 12/03/16 1142 12/04/16 0407  Weight: 88.6 kg (195 lb 5.2 oz) 85.6 kg (188 lb 11.4 oz) 84.6 kg (186 lb 8.2 oz)   Exam  General: Well developed, chronically ill appearing, NAD  HEENT: NCAT, mucous membranes moist.   Cardiovascular: S1 S2 auscultated, no murmurs, irregular  Respiratory: Diminished breath sounds, +scattered rhonchi  Abdomen: Soft, nontender, nondistended, + bowel sounds  Extremities: warm dry without cyanosis clubbing  Neuro: AAOx3, nonfocal  Psych: Appropriate mood and affect  Data Reviewed: I have personally reviewed  following labs and imaging studies  CBC: Recent Labs  Lab 11/30/16 0229 12/01/16 0501 12/02/16 0605 12/03/16 0356 12/04/16 0329  WBC 13.2* 10.4 11.9* 11.2* 27.4*  HGB 7.9* 8.8* 9.0* 8.9* 9.1*  HCT 25.0* 27.7* 28.7* 28.2* 28.8*  MCV 92.3 94.9 94.4 94.0 96.3  PLT 161 216 260 304 947   Basic Metabolic Panel: Recent Labs  Lab 11/30/16 0229 12/01/16 0501 12/02/16 0605 12/03/16 0356 12/04/16 0329  NA 133* 134* 134* 133* 135  K 3.3* 3.9 3.8 3.6 4.6  CL 97* 97* 98* 96* 99*  CO2 25 23 25 25 25   GLUCOSE  149* 159* 147* 153* 107*  BUN 18 35* 26* 42* 25*  CREATININE 2.50* 3.94* 3.03* 4.11* 2.95*  CALCIUM 7.8* 8.4* 8.4* 8.4* 8.0*  PHOS  --  4.6  --   --   --    GFR: Estimated Creatinine Clearance: 19.7 mL/min (A) (by C-G formula based on SCr of 2.95 mg/dL (H)). Liver Function Tests: Recent Labs  Lab 11/29/16 0746 12/01/16 0501  AST 16 13*  ALT 16* 14*  ALKPHOS 75 67  BILITOT 0.8 1.0  PROT 5.1* 5.4*  ALBUMIN 2.1* 2.4*   No results for input(s): LIPASE, AMYLASE in the last 168 hours. No results for input(s): AMMONIA in the last 168 hours. Coagulation Profile: Recent Labs  Lab 11/30/16 0849 12/01/16 0816 12/02/16 0605 12/03/16 0356 12/04/16 0329  INR 6.13* 1.26 1.31 1.56 2.26   Cardiac Enzymes: No results for input(s): CKTOTAL, CKMB, CKMBINDEX, TROPONINI in the last 168 hours. BNP (last 3 results) No results for input(s): PROBNP in the last 8760 hours. HbA1C: No results for input(s): HGBA1C in the last 72 hours. CBG: Recent Labs  Lab 12/03/16 1601 12/03/16 1940 12/03/16 2322 12/03/16 2355 12/04/16 0406  GLUCAP 155* 155* 34* 120* 105*   Lipid Profile: No results for input(s): CHOL, HDL, LDLCALC, TRIG, CHOLHDL, LDLDIRECT in the last 72 hours. Thyroid Function Tests: No results for input(s): TSH, T4TOTAL, FREET4, T3FREE, THYROIDAB in the last 72 hours. Anemia Panel: No results for input(s): VITAMINB12, FOLATE, FERRITIN, TIBC, IRON, RETICCTPCT in the last 72 hours. Urine analysis:    Component Value Date/Time   COLORURINE YELLOW 10/06/2016 1353   APPEARANCEUR HAZY (A) 10/06/2016 1353   LABSPEC 1.009 10/06/2016 1353   PHURINE 6.0 10/06/2016 1353   GLUCOSEU NEGATIVE 10/06/2016 1353   HGBUR NEGATIVE 10/06/2016 1353   BILIRUBINUR NEGATIVE 10/06/2016 1353   KETONESUR NEGATIVE 10/06/2016 1353   PROTEINUR NEGATIVE 10/06/2016 1353   UROBILINOGEN 0.2 08/18/2014 1527   NITRITE NEGATIVE 10/06/2016 1353   LEUKOCYTESUR LARGE (A) 10/06/2016 1353   Sepsis  Labs: @LABRCNTIP (procalcitonin:4,lacticidven:4)  ) Recent Results (from the past 240 hour(s))  Blood Culture (routine x 2)     Status: None   Collection Time: 12/06/2016  3:23 PM  Result Value Ref Range Status   Specimen Description BLOOD LEFT HAND  Final   Special Requests   Final    BOTTLES DRAWN AEROBIC ONLY Blood Culture adequate volume   Culture NO GROWTH 5 DAYS  Final   Report Status 12/01/2016 FINAL  Final  Gastrointestinal Panel by PCR , Stool     Status: None   Collection Time: 12/01/2016  4:06 PM  Result Value Ref Range Status   Campylobacter species NOT DETECTED NOT DETECTED Final   Plesimonas shigelloides NOT DETECTED NOT DETECTED Final   Salmonella species NOT DETECTED NOT DETECTED Final   Yersinia enterocolitica NOT DETECTED NOT DETECTED Final   Vibrio species NOT DETECTED  NOT DETECTED Final   Vibrio cholerae NOT DETECTED NOT DETECTED Final   Enteroaggregative E coli (EAEC) NOT DETECTED NOT DETECTED Final   Enteropathogenic E coli (EPEC) NOT DETECTED NOT DETECTED Final   Enterotoxigenic E coli (ETEC) NOT DETECTED NOT DETECTED Final   Shiga like toxin producing E coli (STEC) NOT DETECTED NOT DETECTED Final   Shigella/Enteroinvasive E coli (EIEC) NOT DETECTED NOT DETECTED Final   Cryptosporidium NOT DETECTED NOT DETECTED Final   Cyclospora cayetanensis NOT DETECTED NOT DETECTED Final   Entamoeba histolytica NOT DETECTED NOT DETECTED Final   Giardia lamblia NOT DETECTED NOT DETECTED Final   Adenovirus F40/41 NOT DETECTED NOT DETECTED Final   Astrovirus NOT DETECTED NOT DETECTED Final   Norovirus GI/GII NOT DETECTED NOT DETECTED Final   Rotavirus A NOT DETECTED NOT DETECTED Final   Sapovirus (I, II, IV, and V) NOT DETECTED NOT DETECTED Final  C difficile quick scan w PCR reflex     Status: Abnormal   Collection Time: 12/01/2016  4:06 PM  Result Value Ref Range Status   C Diff antigen POSITIVE (A) NEGATIVE Final   C Diff toxin NEGATIVE NEGATIVE Final   C Diff  interpretation Results are indeterminate. See PCR results.  Final  Clostridium Difficile by PCR     Status: None   Collection Time: 11/30/2016  4:06 PM  Result Value Ref Range Status   Toxigenic C Difficile by pcr NEGATIVE NEGATIVE Final    Comment: Patient is colonized with non toxigenic C. difficile. May not need treatment unless significant symptoms are present.  MRSA PCR Screening     Status: Abnormal   Collection Time: 11/22/2016  9:34 PM  Result Value Ref Range Status   MRSA by PCR INVALID RESULTS, SPECIMEN SENT FOR CULTURE (A) NEGATIVE Final    Comment:        The GeneXpert MRSA Assay (FDA approved for NASAL specimens only), is one component of a comprehensive MRSA colonization surveillance program. It is not intended to diagnose MRSA infection nor to guide or monitor treatment for MRSA infections.   MRSA culture     Status: None   Collection Time: 11/17/2016  9:34 PM  Result Value Ref Range Status   Specimen Description NASAL SWAB  Final   Special Requests NONE  Final   Culture NO MRSA DETECTED  Final   Report Status 11/28/2016 FINAL  Final  Culture, blood (Routine X 2) w Reflex to ID Panel     Status: None   Collection Time: 12/02/2016 11:23 PM  Result Value Ref Range Status   Specimen Description BLOOD LEFT HAND  Final   Special Requests IN PEDIATRIC BOTTLE Blood Culture adequate volume  Final   Culture NO GROWTH 5 DAYS  Final   Report Status 12/02/2016 FINAL  Final      Radiology Studies: Dg Chest Port 1 View  Result Date: 11/29/2016 CLINICAL DATA:  Acute respiratory failure with hypoxia EXAM: PORTABLE CHEST 1 VIEW COMPARISON:  11/28/2016 FINDINGS: Right dialysis catheter remains in place, unchanged. Prior CABG. Cardiomegaly. Vascular congestion and worsening interstitial and alveolar opacities in the lungs, left greater than right, favor asymmetric edema although infection cannot be completely excluded. Small left  has small bilateral effusions. IMPRESSION: Asymmetric  airspace disease, left greater than right. Asymmetric edema versus infection. Small bilateral effusions. Electronically Signed   By: Rolm Baptise M.D.   On: 11/29/2016 08:37   Dg Chest Port 1 View  Result Date: 11/28/2016 CLINICAL DATA:  83 year old male with new  end-stage renal disease on hemodialysis presenting with diarrhea abdominal pain fever cough and shortness of breath. Possible sepsis. EXAM: PORTABLE CHEST 1 VIEW COMPARISON:  11/27/2016 and earlier. FINDINGS: Portable AP semi upright view at 0411 hours. Stable lung volumes. Regressed veiling opacity at the left lung base with now partial visualization of the left hemidiaphragm. Stable cardiomegaly and mediastinal contours. Stable right chest dialysis catheter. Prior CABG. Stable pulmonary vascularity. The right lung otherwise remains clear. Stable visualized osseous structures. No pneumoperitoneum identified. IMPRESSION: 1. Partial regression of left pleural effusion and lung base opacity. 2. Stable vascular congestion without overt edema. 3. No new cardiopulmonary abnormality. Electronically Signed   By: Genevie Ann M.D.   On: 11/28/2016 07:27     Scheduled Meds: . amiodarone  200 mg Oral Daily  . arformoterol  15 mcg Nebulization BID  . aspirin  81 mg Oral Daily  . budesonide (PULMICORT) nebulizer solution  0.5 mg Nebulization BID  . calcitRIOL  0.25 mcg Oral Daily  . chlorhexidine  15 mL Mouth Rinse BID  . [START ON 12/09/2016] darbepoetin (ARANESP) injection - DIALYSIS  60 mcg Intravenous Q Tue-HD  . feeding supplement (GLUCERNA SHAKE)  237 mL Oral BID BM  . hydrocortisone sod succinate (SOLU-CORTEF) inj  50 mg Intravenous Q12H  . insulin aspart  0-15 Units Subcutaneous Q4H  . [START ON 2016/12/27] levothyroxine  25 mcg Oral QAC breakfast  . mouth rinse  15 mL Mouth Rinse q12n4p  . midodrine  20 mg Oral TID WC  . Warfarin - Pharmacist Dosing Inpatient   Does not apply q1800   Continuous Infusions: . sodium chloride    . ferric  gluconate (FERRLECIT/NULECIT) IV 125 mg (12/03/16 1013)  . heparin 1,400 Units/hr (12/04/16 0815)     LOS: 8 days   Time Spent in minutes   45 minutes  Kamira Mellette D.O. on 12/04/2016 at 11:11 AM  Between 7am to 7pm - Pager - [phone]  After 7pm go to [email or web address] - password [email or web address]  And look for the night coverage person covering for me after hours  [facility] Office  [phone]

## 2016-12-05 ENCOUNTER — Inpatient Hospital Stay (HOSPITAL_COMMUNITY): Payer: Medicare Other

## 2016-12-05 DIAGNOSIS — D72829 Elevated white blood cell count, unspecified: Secondary | ICD-10-CM

## 2016-12-05 DIAGNOSIS — R1030 Lower abdominal pain, unspecified: Secondary | ICD-10-CM

## 2016-12-05 DIAGNOSIS — Z992 Dependence on renal dialysis: Secondary | ICD-10-CM

## 2016-12-05 DIAGNOSIS — R109 Unspecified abdominal pain: Secondary | ICD-10-CM

## 2016-12-05 DIAGNOSIS — N186 End stage renal disease: Secondary | ICD-10-CM

## 2016-12-05 LAB — CBC
HEMATOCRIT: 27.7 % — AB (ref 39.0–52.0)
Hemoglobin: 8.7 g/dL — ABNORMAL LOW (ref 13.0–17.0)
MCH: 29.7 pg (ref 26.0–34.0)
MCHC: 31.4 g/dL (ref 30.0–36.0)
MCV: 94.5 fL (ref 78.0–100.0)
PLATELETS: 288 10*3/uL (ref 150–400)
RBC: 2.93 MIL/uL — ABNORMAL LOW (ref 4.22–5.81)
RDW: 22.1 % — AB (ref 11.5–15.5)
WBC: 31.3 10*3/uL — ABNORMAL HIGH (ref 4.0–10.5)

## 2016-12-05 LAB — BASIC METABOLIC PANEL
Anion gap: 13 (ref 5–15)
BUN: 52 mg/dL — AB (ref 6–20)
CO2: 26 mmol/L (ref 22–32)
CREATININE: 4.2 mg/dL — AB (ref 0.61–1.24)
Calcium: 8.5 mg/dL — ABNORMAL LOW (ref 8.9–10.3)
Chloride: 96 mmol/L — ABNORMAL LOW (ref 101–111)
GFR calc Af Amer: 14 mL/min — ABNORMAL LOW (ref >60)
GFR, EST NON AFRICAN AMERICAN: 12 mL/min — AB (ref >60)
GLUCOSE: 118 mg/dL — AB (ref 65–99)
Potassium: 5.5 mmol/L — ABNORMAL HIGH (ref 3.5–5.1)
SODIUM: 135 mmol/L (ref 135–145)

## 2016-12-05 LAB — GLUCOSE, CAPILLARY
GLUCOSE-CAPILLARY: 139 mg/dL — AB (ref 65–99)
GLUCOSE-CAPILLARY: 113 mg/dL — AB (ref 65–99)
GLUCOSE-CAPILLARY: 170 mg/dL — AB (ref 65–99)
Glucose-Capillary: 114 mg/dL — ABNORMAL HIGH (ref 65–99)
Glucose-Capillary: 108 mg/dL — ABNORMAL HIGH (ref 65–99)

## 2016-12-05 LAB — LACTIC ACID, PLASMA: Lactic Acid, Venous: 2.7 mmol/L (ref 0.5–1.9)

## 2016-12-05 LAB — HEPATITIS B SURFACE AG, CONFIRM: HBSAG CONFIRMATION: POSITIVE — AB

## 2016-12-05 LAB — PROTIME-INR
INR: 3.67
PROTHROMBIN TIME: 36.1 s — AB (ref 11.4–15.2)

## 2016-12-05 LAB — HEPARIN LEVEL (UNFRACTIONATED): HEPARIN UNFRACTIONATED: 0.35 [IU]/mL (ref 0.30–0.70)

## 2016-12-05 LAB — HEPATITIS B SURFACE ANTIGEN

## 2016-12-05 MED ORDER — HYDROCORTISONE NA SUCCINATE PF 100 MG IJ SOLR: 20.0000 mg | Freq: Two times a day (BID) | INTRAMUSCULAR | Status: DC

## 2016-12-05 MED ORDER — SODIUM CHLORIDE 0.9 % IV BOLUS (SEPSIS)
1000.0000 mL | Freq: Once | INTRAVENOUS | Status: AC
  Administered 2016-12-05: 1000 mL via INTRAVENOUS

## 2016-12-05 MED ORDER — LORAZEPAM 2 MG/ML IJ SOLN: 0.5000 mg | INTRAMUSCULAR | Status: DC | PRN

## 2016-12-05 MED ORDER — VANCOMYCIN HCL 10 G IV SOLR
1750.0000 mg | Freq: Once | INTRAVENOUS | Status: DC
  Filled 2016-12-05: qty 1750

## 2016-12-05 MED ORDER — PIPERACILLIN-TAZOBACTAM 3.375 G IVPB: 3.3750 g | Freq: Two times a day (BID) | INTRAVENOUS | Status: DC

## 2016-12-05 MED ORDER — GLYCOPYRROLATE 0.2 MG/ML IJ SOLN
0.2000 mg | INTRAMUSCULAR | Status: DC | PRN
  Administered 2016-12-05: 0.2 mg via INTRAVENOUS
  Filled 2016-12-05: qty 1

## 2016-12-05 MED ORDER — HYDROMORPHONE HCL 1 MG/ML IJ SOLN
0.5000 mg | INTRAMUSCULAR | Status: DC | PRN
  Administered 2016-12-05 (×2): 0.5 mg via INTRAVENOUS
  Filled 2016-12-05 (×2): qty 0.5

## 2016-12-05 MED ORDER — VANCOMYCIN HCL IN DEXTROSE 1-5 GM/200ML-% IV SOLN: 1000.0000 mg | INTRAVENOUS | Status: DC

## 2016-12-05 MED ORDER — HYDROMORPHONE HCL 1 MG/ML IJ SOLN
0.2500 mg | INTRAMUSCULAR | Status: DC | PRN
  Administered 2016-12-05: 0.5 mg via INTRAVENOUS
  Filled 2016-12-05: qty 0.5

## 2016-12-05 MED ORDER — SODIUM CHLORIDE 0.9 % IV SOLN
INTRAVENOUS | Status: DC
  Administered 2016-12-05: 10:00:00 via INTRAVENOUS

## 2016-12-05 MED ORDER — LORAZEPAM 2 MG/ML IJ SOLN
1.0000 mg | INTRAMUSCULAR | Status: DC | PRN
  Administered 2016-12-05: 1 mg via INTRAVENOUS
  Filled 2016-12-05: qty 1

## 2016-12-05 MED ORDER — PIPERACILLIN-TAZOBACTAM 3.375 G IVPB 30 MIN
3.3750 g | Freq: Once | INTRAVENOUS | Status: DC
  Filled 2016-12-05: qty 50

## 2016-12-08 ENCOUNTER — Ambulatory Visit: Payer: Self-pay | Admitting: Thoracic Surgery (Cardiothoracic Vascular Surgery)

## 2016-12-10 LAB — CULTURE, BLOOD (ROUTINE X 2)
CULTURE: NO GROWTH
Culture: NO GROWTH

## 2016-12-13 NOTE — Progress Notes (Signed)
[facility] Progress Note  Subjective: Looks terrible- rapid breathing- in pain - belly more distended  Vitals:   Dec 27, 2016 0512 2016-12-27 0600 December 27, 2016 0700 2016-12-27 0744  BP: 116/71 114/65 112/74 115/60  Pulse: (!) 110 (!) 112 (!) 108 (!) 106  Resp: 20 18 18 20   Temp:    97.6 F (36.4 C)  TempSrc:    Oral  SpO2: 100% 95% 100% 100%  Weight:      Height:        Inpatient medications: . amiodarone  200 mg Oral Daily  . arformoterol  15 mcg Nebulization BID  . aspirin  81 mg Oral Daily  . budesonide (PULMICORT) nebulizer solution  0.5 mg Nebulization BID  . calcitRIOL  0.25 mcg Oral Daily  . chlorhexidine  15 mL Mouth Rinse BID  . [START ON 12/09/2016] darbepoetin (ARANESP) injection - DIALYSIS  60 mcg Intravenous Q Tue-HD  . feeding supplement (GLUCERNA SHAKE)  237 mL Oral BID BM  . hydrocortisone sod succinate (SOLU-CORTEF) inj  20 mg Intravenous Q12H  . insulin aspart  0-15 Units Subcutaneous Q4H  . levothyroxine  25 mcg Oral QAC breakfast  . mouth rinse  15 mL Mouth Rinse q12n4p  . midodrine  20 mg Oral TID WC  . Warfarin - Pharmacist Dosing Inpatient   Does not apply q1800   . sodium chloride    . ferric gluconate (FERRLECIT/NULECIT) IV Stopped (12/04/16 1221)  . heparin 1,400 Units/hr (12/04/16 1818)   sodium chloride, acetaminophen, guaiFENesin-dextromethorphan, ondansetron (ZOFRAN) IV, promethazine, RESOURCE THICKENUP CLEAR  Exam: General: Ill appearing caucasian male- rapid breathing- asking for help  Neck: Supple. JVD with mild elevation. CV: S1,S2, regularly irreg. No M/G/R Lungs: clear bilat , no wheezing Abd: distended more tender Ext: no LE edema  Neuro: Alert and oriented X 3. Dialysis: Maturing RUA AVF + bruit RIJ TDC drsg CDI.   Dialysis Orders: [facility] 4h   90kg  2/2.25  Hep 2000  R IJ TDC/ maturing RUA AVF (placed 10/16)    Assessment: 1.  Sepsis - ischemic SB w pneumatosis, per [facility]. Vanc/cefepime-  now off abx- probably need abx  restarted due to sxms recurred -WBC increasing- reconsult surgery ?? 2.  SOB/ pulm edema - improved- on 2 L [place]- looks like more volume could be removed with HD but trying not to drop BP-  3.  ESRD - recent start. TTS HD.  Plan HD M/W/Sat on holiday sched- next due Saturday.  K is up some this AM- "I cant do HD today" will check K later and attempt to medically treat 4.  Hypotension, chronic - vol down, but still dyspneic. Difficult situation as wanting to avoid hypotension (for #1).  UF  with HD as tolerates.  Added back home midodrine 20 TID- had some high BPs-then lower- seems like is having exacerbation of this bowel ischemia- not a good prognostic factor 5.  Anemia of CKD - HGB dropping into 8's-  9.1.  started ESA darbe 60/wk w/ HD Monday. Last tsat here was low, 11% on Oct 24. Giving iron as well 6.  MBD of CKD - No binders/VDRA yet. last PTH over 400- have added calcitriol- 0.25 daily here but will convert at DC to TIW 7.  Nutrition -NPO except ice chips- now getting some clears. Albumin 2.4- will suffer more with this set back 8.  Afib: continue coumadin per primary/pharmacy. 9    GOC: Patient is DNR. Wants to continue HD- seemed motivated to get better  and was looking good.  Not today, looks drastically different   [name] A  Jan 04, 2017, 9:09 AM   Recent Labs  Lab 12/01/16 0501  12/04/16 0329 12/04/16 1129 01/04/17 0349  NA 134*   < > 135 135 135  K 3.9   < > 4.6 4.6 5.5*  CL 97*   < > 99* 98* 96*  CO2 23   < > 25 25 26   GLUCOSE 159*   < > 107* 158* 118*  BUN 35*   < > 25* 34* 52*  CREATININE 3.94*   < > 2.95* 3.53* 4.20*  CALCIUM 8.4*   < > 8.0* 8.0* 8.5*  PHOS 4.6  --   --   --   --    < > = values in this interval not displayed.   Recent Labs  Lab 11/29/16 0746 12/01/16 0501  AST 16 13*  ALT 16* 14*  ALKPHOS 75 67  BILITOT 0.8 1.0  PROT 5.1* 5.4*  ALBUMIN 2.1* 2.4*   Recent Labs  Lab 12/04/16 0329 12/04/16 1129 04-Jan-2017 0349  WBC 27.4* 22.5*  31.3*  HGB 9.1* 8.1* 8.7*  HCT 28.8* 26.1* 27.7*  MCV 96.3 95.3 94.5  PLT 273 265 288   Iron/TIBC/Ferritin/ %Sat    Component Value Date/Time   IRON 21 (L) 12/01/2016 0501   TIBC 137 (L) 12/01/2016 0501   IRONPCTSAT 15 (L) 12/01/2016 0501

## 2016-12-13 NOTE — Death Summary Note (Signed)
Death Summary  CORWYN VORA XFG:[id number] DOB: 1930/04/06 DOA: 2016-12-08  PCP: [name], MD  Admit date: 12/08/2016 Date of Death: 2016/12/17 Time of Death: 3:55pm Notification: [name], MD notified of death of Dec 17, 2016   History of present illness:  HPI on 2016-12-08 by Dr. Aloha Gell, PCCM 83 year old male who presented to ER 2022-12-09 with ongoing diarrhea, worsening shortness of breath, cough, fever of 103, abdominal distention and discomfort. Found to have concern for sepis (?abd vs pulm source) admitted for work up. CT abd concerning for possible early distal small bowel ischemia. C-Diff Ag + / Toxin negative. CXR concerning for bibasilar atelectasis / effusion and patchy atelectasis. Pt was subsequently managed for severe sepsis, likely secondary to GI source. Patient also noted to have acute respiratory failure with pleural effusion which appears to be improving. Nephrology consulted for ESRD. General surgery consulted for small bowel pneumatosis secondary to ischemia and low flow state, recommending conservative management. TR[facility] assumed care on 11/29/2016  Patient had been slowing declining over the past few days, with notable rise in WBC. Today, pt noted to be critically ill appearing, in mild distress/uncomfortable/restless. Pt still reported abdominal pain, nausea, with 1 episode of vomiting. Denies any chest pain, worsening SOB, fever/chills. Patient currently has gone downhill, despite all management as listed below  Palliative team was already on board and pt had been made DNR. Discussed with patient and family, they all agreed for comfort care and was transitioned to hospice. Pt was awaiting transfer to outpt hospice prior to demise.   Final Diagnoses:  1. Severe sepsis possibly secondary to small bowel pneumatosis/ischemia vs aspiration   All other diagnoses while inpatient Acute hypoxic respiratory failure/left pleural effusion/COPD Atrial  fibrillation Supratherapeutic INR/coagulopathy End-stage renal disease on hemodialysis Anemia of chronic disease Acute metabolic encephalopathy Hyperkalemia Hypothyroidism Diabetes mellitus Rheumatoid arthritis  LUE DVT/edema       The results of significant diagnostics from this hospitalization (including imaging, microbiology, ancillary and laboratory) are listed below for reference.    Significant Diagnostic Studies: Ct Abdomen Pelvis Wo Contrast  Result Date: 2016-12-08 CLINICAL DATA:  Generalized weakness with nausea EXAM: CT ABDOMEN AND PELVIS WITHOUT CONTRAST TECHNIQUE: Multidetector CT imaging of the abdomen and pelvis was performed following the standard protocol without IV contrast. COMPARISON:  10/16/2016 FINDINGS: Lower chest: Moderate left pleural effusion is noted. Mild left lower lobe atelectatic changes are seen. No other focal infiltrate is noted. Hepatobiliary: No focal liver abnormality is seen. No gallstones, gallbladder wall thickening, or biliary dilatation. Pancreas: Unremarkable. No pancreatic ductal dilatation or surrounding inflammatory changes. Spleen: Normal in size without focal abnormality. Adrenals/Urinary Tract: The adrenal glands are within normal limits. The kidneys demonstrates some cystic change stable from the previous exam. No renal calculi or obstructive changes are noted. The bladder is decompressed. Stomach/Bowel: Colon is well distended with both fluid and fecal material. No obstructive changes are seen. The appendix is not well visualized although no inflammatory changes to suggest appendicitis are seen. Mildly prominent loops of small bowel are noted in the mid abdomen containing fluid which may represent some mild enteritis. No definitive transition zone is identified. The distal aspect of the ileum however there are a few loops which have changes suggestive of pneumatosis. No portal venous gas is noted. Very mild inflammatory changes are noted  surrounding the distal ileum. Vascular/Lymphatic: Aortic atherosclerosis. No enlarged abdominal or pelvic lymph nodes. Reproductive: Prostate is unremarkable. Other: No abdominal wall hernia or abnormality. No abdominopelvic ascites. Musculoskeletal: Degenerative  changes of lumbar spine are noted. IMPRESSION: Mild small bowel distention without definitive transition zone. A few of the loops in the distal ileum demonstrate findings suggestive of pneumatosis. This could represent some very early distal small bowel ischemia. No portal venous air is noted. Correlation to the physical exam is recommended. Moderate left pleural effusion with associated atelectatic changes. No other focal abnormality is seen. Critical Value/emergent results were called by telephone at the time of interpretation on 11/16/2016 at 5:05 pm to Dr. Merrily Pew , who verbally acknowledged these results. Electronically Signed   By: Inez Catalina M.D.   On: 12/12/2016 17:06   Dg Chest 2 View  Result Date: 12/09/2016 CLINICAL DATA:  Increasing weakness, diarrhea and vomiting since last night. Began hemodialysis approximately 6 weeks ago. EXAM: CHEST  2 VIEW COMPARISON:  Radiographs 11/13/2016 and 11/07/2016. FINDINGS: Right IJ dialysis catheters appear unchanged at the level of the superior cavoatrial junction. There is stable cardiomegaly status post CABG and left atrial appendage clipping. There are lower lung volumes with increased patchy opacities at both lung bases, likely atelectasis. Vascular congestion is present without overt pulmonary edema, confluent airspace opacity or significant pleural effusion. IMPRESSION: Vascular congestion with increased patchy bibasilar opacities, likely atelectasis. No definite edema. Electronically Signed   By: Richardean Sale M.D.   On: 12/02/2016 16:52   Dg Chest 2 View  Result Date: 11/13/2016 CLINICAL DATA:  Leukocytosis with productive cough. EXAM: CHEST  2 VIEW COMPARISON:  11/07/2016. FINDINGS:  Enlarged but stable cardiomediastinal silhouette. Dual lumen catheter from RIGHT IJ approach stable, with tips at the cavoatrial junction. Mild vascular congestion with slight improvement in interstitial edema. No definite consolidation. Atelectatic change at the LEFT base. IMPRESSION: Slight improvement aeration. Mild vascular congestion, with slight improvement in interstitial edema. Stable cardiac enlargement. Electronically Signed   By: Staci Righter M.D.   On: 11/13/2016 10:33   Dg Chest 2 View  Result Date: 11/07/2016 CLINICAL DATA:  Congestive heart failure EXAM: CHEST  2 VIEW COMPARISON:  November 03, 2016 FINDINGS: There is stable interstitial pulmonary edema. There is atelectatic change in the left base. There is no airspace consolidation. There is cardiomegaly with mild pulmonary venous hypertension. Central catheter tip is in the superior vena cava. No pneumothorax. No adenopathy. There is aortic atherosclerosis. There is evidence of coronary artery bypass grafting. There is a left atrial appendage clamp. There is a total shoulder prosthesis on the left. There is postoperative change in the right shoulder. IMPRESSION: Stable cardiomegaly with interstitial edema. No new opacity. There is atelectatic change in left base. No consolidation. Central catheter tip in superior vena cava.  No pneumothorax. There is aortic atherosclerosis. Aortic Atherosclerosis (ICD10-I70.0). Electronically Signed   By: Lowella Grip III M.D.   On: 11/07/2016 10:45   Dg Abd 1 View  Result Date: 11/09/2016 CLINICAL DATA:  Nausea for multiple months. EXAM: ABDOMEN - 1 VIEW COMPARISON:  10/20/2016 FINDINGS: There is mild gaseous distension of partially redundant: Throughout the abdomen, with the degree of distension less than on the prior study. A small to moderate amount of stool is present in the colon. No dilated small bowel loops are seen to suggest obstruction. No intraperitoneal free air is identified, with  sensitivity reduced by supine technique. No acute osseous abnormality is identified. IMPRESSION: Mild gaseous distension of the colon, less than on the prior study. Electronically Signed   By: Logan Bores M.D.   On: 11/09/2016 10:33   Dg Chest [facility]  Result Date: 12/04/2016 CLINICAL DATA:  Fever EXAM: PORTABLE CHEST 1 VIEW COMPARISON:  December 01, 2016 FINDINGS: Central catheter tip is in superior vena cava. No pneumothorax. There is atelectatic change in the left base with minimal left pleural effusion. The lungs elsewhere are clear. There is stable cardiomegaly with pulmonary vascularity within normal limits. There is aortic atherosclerosis. There is a left atrial appendage clamp as well as evidence of prior coronary artery bypass grafting. There is postoperative change in the lower neck in the thyroid region. There is a total shoulder replacement on the left. IMPRESSION: Left base atelectasis with small left pleural effusion. Lungs elsewhere clear. There is stable cardiomegaly. Central catheter tip is in the superior vena cava. No pneumothorax. There is aortic atherosclerosis. Aortic Atherosclerosis (ICD10-I70.0). Electronically Signed   By: Lowella Grip III M.D.   On: 12/04/2016 12:27   Dg Chest Port 1 View  Result Date: 12/01/2016 CLINICAL DATA:  Right side of Neck swellingMD wants to visualize the right upper HD catheter due to pt.s swelling on right side of neck. EXAM: PORTABLE CHEST 1 VIEW COMPARISON:  11/29/2016 and older exams. FINDINGS: There stable changes from prior cardiac surgery. Cardiac silhouette is mildly enlarged. No mediastinal or hilar masses. Right internal jugular tunneled dual lumen central venous catheter. Tip projects in the lower superior vena cava. This is stable from the prior study. There are prominent bronchovascular markings. Hazy opacities noted in the lower lungs, left greater than right, likely combination of small pleural effusions and atelectasis. The  left-sided hazy opacity has improved from the most recent prior exam. No pneumothorax. Skeletal structures are demineralized. Left shoulder prosthesis is normally aligned and stable. IMPRESSION: 1. No change in the position of the right internal jugular dual-lumen tunneled central venous catheter. Tip remains in the lower superior vena cava. 2. Mild improvement in lung aeration. There is less lung base opacity, which remains greater on the left. This is most likely a combination of small effusions and atelectasis. 3. No overt pulmonary edema. Electronically Signed   By: Lajean Manes M.D.   On: 12/01/2016 20:00   Dg Chest Port 1 View  Result Date: 11/29/2016 CLINICAL DATA:  Acute respiratory failure with hypoxia EXAM: PORTABLE CHEST 1 VIEW COMPARISON:  11/28/2016 FINDINGS: Right dialysis catheter remains in place, unchanged. Prior CABG. Cardiomegaly. Vascular congestion and worsening interstitial and alveolar opacities in the lungs, left greater than right, favor asymmetric edema although infection cannot be completely excluded. Small left has small bilateral effusions. IMPRESSION: Asymmetric airspace disease, left greater than right. Asymmetric edema versus infection. Small bilateral effusions. Electronically Signed   By: Rolm Baptise M.D.   On: 11/29/2016 08:37   Dg Chest Port 1 View  Result Date: 11/28/2016 CLINICAL DATA:  83 year old male with new end-stage renal disease on hemodialysis presenting with diarrhea abdominal pain fever cough and shortness of breath. Possible sepsis. EXAM: PORTABLE CHEST 1 VIEW COMPARISON:  11/27/2016 and earlier. FINDINGS: Portable AP semi upright view at 0411 hours. Stable lung volumes. Regressed veiling opacity at the left lung base with now partial visualization of the left hemidiaphragm. Stable cardiomegaly and mediastinal contours. Stable right chest dialysis catheter. Prior CABG. Stable pulmonary vascularity. The right lung otherwise remains clear. Stable visualized  osseous structures. No pneumoperitoneum identified. IMPRESSION: 1. Partial regression of left pleural effusion and lung base opacity. 2. Stable vascular congestion without overt edema. 3. No new cardiopulmonary abnormality. Electronically Signed   By: Genevie Ann M.D.   On: 11/28/2016 07:27  Dg Chest Port 1 View  Result Date: 11/27/2016 CLINICAL DATA:  Respiratory failure EXAM: PORTABLE CHEST 1 VIEW COMPARISON:  11/27/2016 FINDINGS: Mild enlargement of the cardiopericardial silhouette noted with obscuration of the left lung base and hazy opacity in the left mid lung favoring layering effusion. Prior CABG and left atrial appendage clamp. Right IJ dialysis catheter tip: Right atrium. Atherosclerotic calcification of the aortic arch. Left proximal humeral implant. Subsegmental atelectasis at the right lung base. Mildly indistinct pulmonary vasculature. IMPRESSION: 1. Worsening airspace opacities left lung base likely representing combination of pleural effusion and atelectasis. There is subsegmental atelectasis at the right lung base. 2. Cardiomegaly with pulmonary venous hypertension. 3. Dialysis catheter tip: Right atrium. Electronically Signed   By: Van Clines M.D.   On: 11/27/2016 07:13   Dg Abd Portable 1v  Result Date: 12/04/2016 CLINICAL DATA:  Nausea and vomiting EXAM: PORTABLE ABDOMEN - 1 VIEW COMPARISON:  CT abdomen and pelvis November 26, 2016. FINDINGS: There is no appreciable bowel dilatation or air-fluid level to suggest bowel obstruction. No free air. There is moderate stool in the colon. No abnormal calcifications. IMPRESSION: No bowel obstruction or free air evident. Electronically Signed   By: Lowella Grip III M.D.   On: 12/04/2016 12:28   Dg Abd Portable 2v  Result Date: 2017/01/01 CLINICAL DATA:  83 year old male with abdominal distention and pain. Distended small bowel without transition point on CT Abdomen and Pelvis earlier this month. Sepsis. End-stage renal disease.  EXAM: PORTABLE ABDOMEN - 2 VIEW COMPARISON:  12/04/2016 and earlier. FINDINGS: Portable upright AP view of the chest and upper abdomen at 1030 hours. No pneumoperitoneum. No pneumothorax. Right chest dual lumen dialysis type catheter. Prior CABG. Patchy opacity at the left lung base. Continued mildly dilated gas-filled small bowel loops in the left mid abdomen. Similar volume of colonic gas since the CT on 11/24/2016. Abdominal and pelvic visceral contours appear stable. Left upper quadrant surgical clips again noted. Stable visualized osseous structures. IMPRESSION: 1. No free air. 2. Bowel gas pattern not significantly improved since 11/28/2016, with differential considerations of partial small bowel obstruction and small bowel ileus. 3. Patchy left lung base opacity could reflect atelectasis or pneumonia. Electronically Signed   By: Genevie Ann M.D.   On: 01/01/2017 11:00   Dg Swallowing Func-speech Pathology  Result Date: 11/20/2016 Objective Swallowing Evaluation: Type of Study: MBS-Modified Barium Swallow Study  Patient Details Name: SIMON LLAMAS MRN: [id number] Date of Birth: September 09, 1930 Today's Date: 11/20/2016 Past Medical History: Past Medical History: Diagnosis Date . Anemia  . Asthma  . Atrial fibrillation ([place]) 12/08/2013 . CHF (congestive heart failure) ([place])  . Chronic joint pain   "from RA" (10/01/2016) . CKD (chronic kidney disease), stage III ([place])  . Constipation   takes stool softener daily . COPD (chronic obstructive pulmonary disease) ([place])  . Coronary artery disease  . Coronary artery disease involving native coronary artery of native heart with angina pectoris ([place]) 09/30/2016 . Dysrhythmia   a-fib . GERD (gastroesophageal reflux disease)   takes Zantac daily  . History of blood transfusion   "related to one of my ORs" . Hyperlipidemia   takes Pravastatin daily . Hypertension   takes Cardizem,Proscar,and Lisinopril daily . Hypothyroidism  . Impaired hearing   wears hearing aids . Joint swelling    "from RA" (10/01/2016) . Peripheral neuropathy  . Pneumonia 2017 . Rheumatoid arthritis ([place])   "all over; take orencia  q 4 wks" (10/01/2016) . S/P CABG x 2 10/09/2016  LIMA to LAD, SVG to OM, EVH via right thigh . S/P Maze operation for atrial fibrillation 10/09/2016  Complete bilateral atrial lesion set using bipolar radiofrequency and cryothermy ablation with clipping of LA appendage . Type II diabetes mellitus ([place])  Past Surgical History: Past Surgical History: Procedure Laterality Date . ANKLE FUSION Left 2014 . BIOPSY THYROID  2014 . CARDIAC CATHETERIZATION  09/30/2016 . CARPAL TUNNEL RELEASE Bilateral 1990's . CATARACT EXTRACTION W/ INTRAOCULAR LENS  IMPLANT, BILATERAL Bilateral 1990's . COLONOSCOPY   . JOINT REPLACEMENT   . REPAIR TENDONS FOOT Left 1946; 1984  "farming accident; repaired tendons both times" . SHOULDER ARTHROSCOPY W/ ROTATOR CUFF REPAIR Right 1990's . TONSILLECTOMY  1930's . TOTAL KNEE ARTHROPLASTY Right 12/16/2010  Procedure: TOTAL KNEE ARTHROPLASTY; rt Surgeon: Rudean Haskell, MD;  Location: [place];  Service: Orthopedics;  Laterality: Right; . TOTAL SHOULDER REPLACEMENT Left 03/2010 HPI: Patient is an 83 year old male with history of hypertension, COPD, GERD, pna, chronic diastolic congestive heart failure, stage IV chronic kidney disease, hyperlipidemia, recurrent paroxysmal atrial fibrillation, bronchiectasis, asthma, rheumatoid arthritis, type 2 diabetes mellitus, and anemia. Underwent CABG 9/27. Intubated 9/27-9/28, 10/3-10/8. CXR RIGHT pigtail catheter no longer seen. No pneumothorax. Slight improvement in pulmonary edema. Started on Dys 3, honey thick liquids diet per MBS.  Repeat study ordered today to determine readiness to advance prior to discharge home.    No Data Recorded Assessment / Plan / Recommendation CHL IP CLINICAL IMPRESSIONS 11/20/2016 Clinical Impression Pt presents with modest improvements in swallowing function in comparison to last week.  Pt continues to have  penetration with both nectar and thin liquids which clears more completely from the laryngeal vestibule with thickened liquids.  However, penetration subjectively appears decreased in frequency and intensity in comparison to previous study.  Despite slight improvements in function, do not recommend further diet advancement at this time given pt's tenuous medical state and debility.  Pt may have sips of water in between meals following oral care to continue working towards further diet progression.   SLP Visit Diagnosis Dysphagia, oropharyngeal phase (R13.12)     Impact on safety and function Mild aspiration risk;Moderate aspiration risk   CHL IP TREATMENT RECOMMENDATION 10/29/2016 Treatment Recommendations Therapy as outlined in treatment plan below   Prognosis 10/29/2016 Prognosis for Safe Diet Advancement (No Data) Barriers to Reach Goals -- Barriers/Prognosis Comment -- CHL IP DIET RECOMMENDATION 11/20/2016 SLP Diet Recommendations Regular solids;Nectar thick liquid;Free water protocol after oral care Liquid Administration via -- Medication Administration Whole meds with puree Compensations Slow rate;Small sips/bites;Hard cough after swallow;Multiple dry swallows after each bite/sip Postural Changes Seated upright at 90 degrees   CHL IP OTHER RECOMMENDATIONS 10/29/2016 Recommended Consults -- Oral Care Recommendations Oral care BID Other Recommendations --   CHL IP FOLLOW UP RECOMMENDATIONS 10/31/2016 Follow up Recommendations Inpatient Rehab   CHL IP FREQUENCY AND DURATION 10/29/2016 Speech Therapy Frequency (ACUTE ONLY) min 2x/week Treatment Duration 2 weeks      CHL IP ORAL PHASE 11/20/2016 Oral Phase Impaired Oral - Pudding Teaspoon -- Oral - Pudding Cup -- Oral - Honey Teaspoon -- Oral - Honey Cup -- Oral - Nectar Teaspoon -- Oral - Nectar Cup WFL Oral - Nectar Straw -- Oral - Thin Teaspoon -- Oral - Thin Cup Piecemeal swallowing Oral - Thin Straw Premature spillage Oral - Puree -- Oral - Mech Soft -- Oral -  Regular -- Oral - Multi-Consistency -- Oral - Pill -- Oral Phase - Comment --  CHL IP PHARYNGEAL PHASE 11/20/2016 Pharyngeal  Phase Impaired Pharyngeal- Pudding Teaspoon -- Pharyngeal -- Pharyngeal- Pudding Cup -- Pharyngeal -- Pharyngeal- Honey Teaspoon -- Pharyngeal -- Pharyngeal- Honey Cup -- Pharyngeal -- Pharyngeal- Nectar Teaspoon -- Pharyngeal -- Pharyngeal- Nectar Cup Reduced airway/laryngeal closure;Reduced anterior laryngeal mobility;Penetration/Aspiration during swallow;Reduced epiglottic inversion Pharyngeal Material enters airway, remains ABOVE vocal cords then ejected out Pharyngeal- Nectar Straw -- Pharyngeal -- Pharyngeal- Thin Teaspoon -- Pharyngeal -- Pharyngeal- Thin Cup Delayed swallow initiation-vallecula;Delayed swallow initiation-pyriform sinuses;Reduced airway/laryngeal closure;Reduced anterior laryngeal mobility;Penetration/Aspiration during swallow;Reduced epiglottic inversion Pharyngeal Material enters airway, remains ABOVE vocal cords and not ejected out;Material enters airway, CONTACTS cords and not ejected out Pharyngeal- Thin Straw Delayed swallow initiation-pyriform sinuses;Reduced epiglottic inversion;Reduced anterior laryngeal mobility;Reduced airway/laryngeal closure;Penetration/Aspiration during swallow Pharyngeal Material enters airway, passes BELOW cords without attempt by patient to eject out (silent aspiration) Pharyngeal- Puree -- Pharyngeal -- Pharyngeal- Mechanical Soft -- Pharyngeal -- Pharyngeal- Regular -- Pharyngeal -- Pharyngeal- Multi-consistency -- Pharyngeal -- Pharyngeal- Pill -- Pharyngeal -- Pharyngeal Comment --  CHL IP CERVICAL ESOPHAGEAL PHASE 11/20/2016 Cervical Esophageal Phase Impaired Pudding Teaspoon -- Pudding Cup -- Honey Teaspoon -- Honey Cup -- Nectar Teaspoon -- Nectar Cup -- Nectar Straw -- Thin Teaspoon -- Thin Cup Reduced cricopharyngeal relaxation;Esophageal backflow into cervical esophagus Thin Straw Reduced cricopharyngeal relaxation;Esophageal  backflow into cervical esophagus Puree -- Mechanical Soft -- Regular -- Multi-consistency -- Pill -- Cervical Esophageal Comment -- No flowsheet data found. Page, Selinda Orion 11/20/2016, 4:14 PM              Dg Swallowing Func-speech Pathology  Result Date: 11/12/2016 Objective Swallowing Evaluation: Type of Study: MBS-Modified Barium Swallow Study Patient Details Name: TRAMEL WESTBROOK MRN: [id number] Date of Birth: 1930-10-11 Today's Date: 11/12/2016 Past Medical History: Past Medical History: Diagnosis Date . Anemia  . Asthma  . Atrial fibrillation ([place]) 12/08/2013 . CHF (congestive heart failure) ([place])  . Chronic joint pain   "from RA" (10/01/2016) . CKD (chronic kidney disease), stage III ([place])  . Constipation   takes stool softener daily . COPD (chronic obstructive pulmonary disease) ([place])  . Coronary artery disease  . Coronary artery disease involving native coronary artery of native heart with angina pectoris ([place]) 09/30/2016 . Dysrhythmia   a-fib . GERD (gastroesophageal reflux disease)   takes Zantac daily  . History of blood transfusion   "related to one of my ORs" . Hyperlipidemia   takes Pravastatin daily . Hypertension   takes Cardizem,Proscar,and Lisinopril daily . Hypothyroidism  . Impaired hearing   wears hearing aids . Joint swelling   "from RA" (10/01/2016) . Peripheral neuropathy  . Pneumonia 2017 . Rheumatoid arthritis ([place])   "all over; take orencia  q 4 wks" (10/01/2016) . S/P CABG x 2 10/09/2016  LIMA to LAD, SVG to OM, EVH via right thigh . S/P Maze operation for atrial fibrillation 10/09/2016  Complete bilateral atrial lesion set using bipolar radiofrequency and cryothermy ablation with clipping of LA appendage . Type II diabetes mellitus ([place])  Past Surgical History: Past Surgical History: Procedure Laterality Date . AMPUTATION TOE Left 11/08/2015  Procedure: LEFT HALLUX AMPUTATION AT THE METATARSOPHALANGEAL JOINT;  Surgeon: Wylene Simmer, MD;  Location: [facility];  Service:  Orthopedics;  Laterality: Left; . ANKLE FUSION Left 2014 . AV FISTULA PLACEMENT Right 10/28/2016  Procedure: ARTERIOVENOUS  FISTULA CREATION right arm;  Surgeon: Angelia Mould, MD;  Location: [place];  Service: Vascular;  Laterality: Right; . BIOPSY THYROID  2014 . CARDIAC CATHETERIZATION  09/30/2016 . CARPAL TUNNEL RELEASE Bilateral 1990's .  CATARACT EXTRACTION W/ INTRAOCULAR LENS  IMPLANT, BILATERAL Bilateral 1990's . CLIPPING OF ATRIAL APPENDAGE  10/09/2016  Procedure: CLIPPING OF ATRIAL APPENDAGE;  Surgeon: Rexene Alberts, MD;  Location: [place];  Service: Open Heart Surgery;; . COLONOSCOPY   . CORONARY ARTERY BYPASS GRAFT N/A 10/09/2016  Procedure: CORONARY ARTERY BYPASS GRAFTING (CABG) x two, using left internal mammary artery and right leg greater saphenous vein harvested endoscopically;  Surgeon: Rexene Alberts, MD;  Location: [place];  Service: Open Heart Surgery;  Laterality: N/A; . FOREIGN BODY REMOVAL Right 09/06/2013  Procedure: REMOVAL FOREIGN BODY RIGHT INDEX FINGER;  Surgeon: Daryll Brod, MD;  Location: [place];  Service: Orthopedics;  Laterality: Right;  ANESTHESIA: IV REGIONAL FAB . INSERTION OF DIALYSIS CATHETER N/A 10/28/2016  Procedure: INSERTION OF DIALYSIS CATHETER;  Surgeon: Angelia Mould, MD;  Location: [facility];  Service: Vascular;  Laterality: N/A; . JOINT REPLACEMENT   . MAZE N/A 10/09/2016  Procedure: MAZE;  Surgeon: Rexene Alberts, MD;  Location: [place];  Service: Open Heart Surgery;  Laterality: N/A; . REPAIR TENDONS FOOT Left 1946; 1984  "farming accident; repaired tendons both times" . RIGHT/LEFT HEART CATH AND CORONARY ANGIOGRAPHY N/A 09/30/2016  Procedure: RIGHT/LEFT HEART CATH AND CORONARY ANGIOGRAPHY;  Surgeon: Nigel Mormon, MD;  Location: [place] [facility];  Service: Cardiovascular;  Laterality: N/A; . SHOULDER ARTHROSCOPY W/ ROTATOR CUFF REPAIR Right 1990's . TEE WITHOUT CARDIOVERSION N/A 10/09/2016  Procedure: TRANSESOPHAGEAL ECHOCARDIOGRAM  (TEE);  Surgeon: Rexene Alberts, MD;  Location: [place];  Service: Open Heart Surgery;  Laterality: N/A; . THYROIDECTOMY Right 02/01/2013  Procedure: RIGHT THYROIDECTOMY LOBECTOMY;  Surgeon: Earnstine Regal, MD;  Location: [place];  Service: General;  Laterality: Right; . TONSILLECTOMY  1930's . TOTAL KNEE ARTHROPLASTY Right 12/16/2010  Procedure: TOTAL KNEE ARTHROPLASTY; rt Surgeon: Rudean Haskell, MD;  Location: [place];  Service: Orthopedics;  Laterality: Right; . TOTAL SHOULDER REPLACEMENT Left 03/2010 HPI: Patient is an 83 year old male with history of hypertension, COPD, GERD, pna, chronic diastolic congestive heart failure, stage IV chronic kidney disease, hyperlipidemia, recurrent paroxysmal atrial fibrillation, bronchiectasis, asthma, rheumatoid arthritis, type 2 diabetes mellitus, and anemia. Underwent CABG 9/27. Intubated 9/27-9/28, 10/3-10/8. CXR RIGHT pigtail catheter no longer seen. No pneumothorax. Slight improvement in pulmonary edema. Started on Dys 3, honey thick liquids diet per MBS.  Repeat study ordered today to determine readiness to advance given improved presentation at bedside.   No Data Recorded Assessment / Plan / Recommendation CHL IP CLINICAL IMPRESSIONS 11/12/2016 Clinical Impression Pt presents with improvements in swallowing function in comparison to initial MBS but continues to demonstrate a mild-moderate oropharyngeal dysphagia most remarkable for decreased laryngeal closure across all boluses.  Pt had silent aspiration of large, consecutive boluses of honey thick liquids which were mitigated with supervision cues for rate and portion control.  Pt also had high penetration into the laryngeal vestibule with nectar thick liquids which cleared with supervision cues for volitional throat clear followed by second swallow.  Penetration with thin liquids was deeper into the vestibule and did not clear as effectively with the abovementioned strategies or with trials of chin tuck posture.  Suspect  respiratory deficits are primary factor influencing pt's swallowing dysfunction.  Recommend advancing pt to nectar thick liquids with full supervision for use of the following swallow precautions: upright during meals, small bites/sips, clear throat after every sip of liquids and swallow twice.  Prognosis for advancement is good with ongoing ST interventions for pharyngeal strengthening, EMST,  and management of safe diet progression.   SLP Visit Diagnosis Dysphagia, oropharyngeal phase (R13.12) Attention and concentration deficit following -- Frontal lobe and executive function deficit following -- Impact on safety and function Moderate aspiration risk     CHL IP DIET RECOMMENDATION 11/12/2016 SLP Diet Recommendations Dysphagia 3 (Mech soft) solids;Nectar thick liquid Liquid Administration via Cup Medication Administration Whole meds with puree Compensations Slow rate;Small sips/bites;Hard cough after swallow;Multiple dry swallows after each bite/sip Postural Changes Seated upright at 90 degrees            CHL IP ORAL PHASE 11/12/2016 Oral Phase Impaired Oral - Pudding Teaspoon -- Oral - Pudding Cup -- Oral - Honey Teaspoon -- Oral - Honey Cup WFL Oral - Nectar Teaspoon -- Oral - Nectar Cup WFL Oral - Nectar Straw -- Oral - Thin Teaspoon -- Oral - Thin Cup Piecemeal swallowing Oral - Thin Straw Premature spillage Oral - Puree -- Oral - Mech Soft -- Oral - Regular -- Oral - Multi-Consistency -- Oral - Pill -- Oral Phase - Comment --  CHL IP PHARYNGEAL PHASE 11/12/2016 Pharyngeal Phase Impaired Pharyngeal- Pudding Teaspoon -- Pharyngeal -- Pharyngeal- Pudding Cup -- Pharyngeal -- Pharyngeal- Honey Teaspoon -- Pharyngeal -- Pharyngeal- Honey Cup Penetration/Aspiration during swallow;Reduced airway/laryngeal closure;Reduced epiglottic inversion;Reduced laryngeal elevation Pharyngeal Material enters airway, passes BELOW cords without attempt by patient to eject out (silent aspiration) Pharyngeal- Nectar Teaspoon --  Pharyngeal -- Pharyngeal- Nectar Cup Reduced laryngeal elevation;Reduced airway/laryngeal closure;Penetration/Aspiration during swallow Pharyngeal Material enters airway, remains ABOVE vocal cords and not ejected out Pharyngeal- Nectar Straw -- Pharyngeal -- Pharyngeal- Thin Teaspoon -- Pharyngeal -- Pharyngeal- Thin Cup Reduced airway/laryngeal closure;Reduced laryngeal elevation;Penetration/Aspiration during swallow Pharyngeal Material enters airway, remains ABOVE vocal cords and not ejected out Pharyngeal- Thin Straw Reduced laryngeal elevation;Reduced airway/laryngeal closure;Penetration/Aspiration during swallow Pharyngeal Material enters airway, CONTACTS cords and not ejected out Pharyngeal- Puree -- Pharyngeal -- Pharyngeal- Mechanical Soft -- Pharyngeal -- Pharyngeal- Regular -- Pharyngeal -- Pharyngeal- Multi-consistency -- Pharyngeal -- Pharyngeal- Pill -- Pharyngeal -- Pharyngeal Comment --  CHL IP CERVICAL ESOPHAGEAL PHASE 11/12/2016 Cervical Esophageal Phase WFL Pudding Teaspoon -- Pudding Cup -- Honey Teaspoon -- Honey Cup -- Nectar Teaspoon -- Nectar Cup -- Nectar Straw -- Thin Teaspoon -- Thin Cup -- Thin Straw -- Puree -- Mechanical Soft -- Regular -- Multi-consistency -- Pill -- Cervical Esophageal Comment -- No flowsheet data found. Page, Selinda Orion 11/12/2016, 10:24 AM               Microbiology: Recent Results (from the past 240 hour(s))  Blood Culture (routine x 2)     Status: None   Collection Time: 12/07/2016  3:23 PM  Result Value Ref Range Status   Specimen Description BLOOD LEFT HAND  Final   Special Requests   Final    BOTTLES DRAWN AEROBIC ONLY Blood Culture adequate volume   Culture NO GROWTH 5 DAYS  Final   Report Status 12/01/2016 FINAL  Final  Gastrointestinal Panel by PCR , Stool     Status: None   Collection Time: 11/27/2016  4:06 PM  Result Value Ref Range Status   Campylobacter species NOT DETECTED NOT DETECTED Final   Plesimonas shigelloides NOT DETECTED NOT DETECTED  Final   Salmonella species NOT DETECTED NOT DETECTED Final   Yersinia enterocolitica NOT DETECTED NOT DETECTED Final   Vibrio species NOT DETECTED NOT DETECTED Final   Vibrio cholerae NOT DETECTED NOT DETECTED Final   Enteroaggregative E coli (EAEC) NOT DETECTED NOT DETECTED Final  Enteropathogenic E coli (EPEC) NOT DETECTED NOT DETECTED Final   Enterotoxigenic E coli (ETEC) NOT DETECTED NOT DETECTED Final   Shiga like toxin producing E coli (STEC) NOT DETECTED NOT DETECTED Final   Shigella/Enteroinvasive E coli (EIEC) NOT DETECTED NOT DETECTED Final   Cryptosporidium NOT DETECTED NOT DETECTED Final   Cyclospora cayetanensis NOT DETECTED NOT DETECTED Final   Entamoeba histolytica NOT DETECTED NOT DETECTED Final   Giardia lamblia NOT DETECTED NOT DETECTED Final   Adenovirus F40/41 NOT DETECTED NOT DETECTED Final   Astrovirus NOT DETECTED NOT DETECTED Final   Norovirus GI/GII NOT DETECTED NOT DETECTED Final   Rotavirus A NOT DETECTED NOT DETECTED Final   Sapovirus (I, II, IV, and V) NOT DETECTED NOT DETECTED Final  C difficile quick scan w PCR reflex     Status: Abnormal   Collection Time: 12/10/2016  4:06 PM  Result Value Ref Range Status   C Diff antigen POSITIVE (A) NEGATIVE Final   C Diff toxin NEGATIVE NEGATIVE Final   C Diff interpretation Results are indeterminate. See PCR results.  Final  Clostridium Difficile by PCR     Status: None   Collection Time: 11/22/2016  4:06 PM  Result Value Ref Range Status   Toxigenic C Difficile by pcr NEGATIVE NEGATIVE Final    Comment: Patient is colonized with non toxigenic C. difficile. May not need treatment unless significant symptoms are present.  MRSA PCR Screening     Status: Abnormal   Collection Time: 11/25/2016  9:34 PM  Result Value Ref Range Status   MRSA by PCR INVALID RESULTS, SPECIMEN SENT FOR CULTURE (A) NEGATIVE Final    Comment:        The GeneXpert MRSA Assay (FDA approved for NASAL specimens only), is one component of  a comprehensive MRSA colonization surveillance program. It is not intended to diagnose MRSA infection nor to guide or monitor treatment for MRSA infections.   MRSA culture     Status: None   Collection Time: 11/15/2016  9:34 PM  Result Value Ref Range Status   Specimen Description NASAL SWAB  Final   Special Requests NONE  Final   Culture NO MRSA DETECTED  Final   Report Status 11/28/2016 FINAL  Final  Culture, blood (Routine X 2) w Reflex to ID Panel     Status: None   Collection Time: 11/18/2016 11:23 PM  Result Value Ref Range Status   Specimen Description BLOOD LEFT HAND  Final   Special Requests IN PEDIATRIC BOTTLE Blood Culture adequate volume  Final   Culture NO GROWTH 5 DAYS  Final   Report Status 12/02/2016 FINAL  Final     Labs: Basic Metabolic Panel: Recent Labs  Lab 12/01/16 0501 12/02/16 0605 12/03/16 0356 12/04/16 0329 12/04/16 1129 01-Jan-2017 0349  NA 134* 134* 133* 135 135 135  K 3.9 3.8 3.6 4.6 4.6 5.5*  CL 97* 98* 96* 99* 98* 96*  CO2 23 25 25 25 25 26   GLUCOSE 159* 147* 153* 107* 158* 118*  BUN 35* 26* 42* 25* 34* 52*  CREATININE 3.94* 3.03* 4.11* 2.95* 3.53* 4.20*  CALCIUM 8.4* 8.4* 8.4* 8.0* 8.0* 8.5*  PHOS 4.6  --   --   --   --   --    Liver Function Tests: Recent Labs  Lab 11/29/16 0746 12/01/16 0501  AST 16 13*  ALT 16* 14*  ALKPHOS 75 67  BILITOT 0.8 1.0  PROT 5.1* 5.4*  ALBUMIN 2.1* 2.4*   No results  for input(s): LIPASE, AMYLASE in the last 168 hours. No results for input(s): AMMONIA in the last 168 hours. CBC: Recent Labs  Lab 12/02/16 0605 12/03/16 0356 12/04/16 0329 12/04/16 1129 12/09/16 0349  WBC 11.9* 11.2* 27.4* 22.5* 31.3*  HGB 9.0* 8.9* 9.1* 8.1* 8.7*  HCT 28.7* 28.2* 28.8* 26.1* 27.7*  MCV 94.4 94.0 96.3 95.3 94.5  PLT 260 304 273 265 288   Cardiac Enzymes: No results for input(s): CKTOTAL, CKMB, CKMBINDEX, TROPONINI in the last 168 hours. D-Dimer No results for input(s): DDIMER in the last 72  hours. BNP: Invalid input(s): POCBNP CBG: Recent Labs  Lab 12/04/16 2012 12/04/16 2320 12-09-2016 0322 2016/12/09 0746 12/09/16 1151  GLUCAP 195* 185* 108* 113* 170*   Anemia work up No results for input(s): VITAMINB12, FOLATE, FERRITIN, TIBC, IRON, RETICCTPCT in the last 72 hours. Urinalysis    Component Value Date/Time   COLORURINE YELLOW 10/06/2016 1353   APPEARANCEUR HAZY (A) 10/06/2016 1353   LABSPEC 1.009 10/06/2016 1353   PHURINE 6.0 10/06/2016 1353   GLUCOSEU NEGATIVE 10/06/2016 1353   HGBUR NEGATIVE 10/06/2016 1353   BILIRUBINUR NEGATIVE 10/06/2016 1353   KETONESUR NEGATIVE 10/06/2016 1353   PROTEINUR NEGATIVE 10/06/2016 1353   UROBILINOGEN 0.2 08/18/2014 1527   NITRITE NEGATIVE 10/06/2016 1353   LEUKOCYTESUR LARGE (A) 10/06/2016 1353   Sepsis Labs Invalid input(s): PROCALCITONIN,  WBC,  LACTICIDVEN     SIGNED:  Alma Friendly, MD  [facility] Dec 09, 2016, 4:05 PM Pager   If 7PM-7AM, please contact night-coverage [email or web address] Password [email or web address]

## 2016-12-13 NOTE — Progress Notes (Addendum)
3:50pm CSW informed [facility] has approved pt transfer- CSW went to unit to schedule DC summary and was informed by RN that patient is no longer stable for transfer and is actively dying  CSW signing off  2pm CSW received consult that patient and family have now decided on residential hospice and are hopeful for [place] faxed referral to [facility]- they have bed today and can accept patient as long as approved by medical director  Jorge Ny, [facility] Social Worker [phone]

## 2016-12-13 NOTE — Progress Notes (Addendum)
Daily Progress Note   Patient Name: [name]       Date: 12/11/16 DOB: [birth year]  Age: 83 y.o. MRN#: [id number] Attending Physician: Alma Friendly, MD Primary Care Physician: [name], MD Admit Date: 12/12/2016  Reason for Consultation/Follow-up: Establishing goals of care, Inpatient hospice referral, Non pain symptom management, Pain control, Psychosocial/spiritual support and Terminal Care  Subjective: Met with patient and family; chart reviewed.  Patient has continued to decline over the past week and now overnight is acutely worse..  Worsening leukocytosis, white blood cell count now 31.3; lactic acid 2.7 as well as ongoing hyportension.  He is verbalizing more abdominal pain, throwing up any medications, anything by mouth.  Per his daughter Cheri Kearns, he has been telling her that he does not want to live like this anymore.  Met with daughter Abigail Butts, daughter Cheri Kearns via phone, wife and son-in-law are at the bedside who all verbalize wanting patient to be comfortable.  They recognize that he is not a surgical candidate nor can he tolerate further dialysis and are ready to move toward straight comfort care.  Length of Stay: 9  Current Medications: Scheduled Meds:  . amiodarone  200 mg Oral Daily  . arformoterol  15 mcg Nebulization BID  . aspirin  81 mg Oral Daily  . budesonide (PULMICORT) nebulizer solution  0.5 mg Nebulization BID  . chlorhexidine  15 mL Mouth Rinse BID  . feeding supplement (GLUCERNA SHAKE)  237 mL Oral BID BM  . hydrocortisone sod succinate (SOLU-CORTEF) inj  20 mg Intravenous Q12H  . insulin aspart  0-15 Units Subcutaneous Q4H  . mouth rinse  15 mL Mouth Rinse q12n4p  . midodrine  20 mg Oral TID WC    Continuous Infusions: . sodium chloride    .  sodium chloride 100 mL/hr at 12/11/2016 0959    PRN Meds: sodium chloride, acetaminophen, glycopyrrolate, guaiFENesin-dextromethorphan, HYDROmorphone (DILAUDID) injection, LORazepam, ondansetron (ZOFRAN) IV, promethazine, RESOURCE THICKENUP CLEAR  Physical Exam  Constitutional:  Acutely ill-appearing elderly man.  Confused, verbalizing abdominal pain and short of breath  HENT:  Head: Normocephalic and atraumatic.  Neck: Normal range of motion.  Cardiovascular:  Hyportensive  Pulmonary/Chest:  Mild increased work of breathing at rest  Abdominal: He exhibits distension. There is tenderness.  Genitourinary:  Genitourinary Comments: Foley catheter  Neurological: He  is alert.  Oriented to self and that he is in the hospital  Skin: Skin is warm and dry. There is pallor.  Psychiatric:  Anxious  Nursing note and vitals reviewed.           Vital Signs: BP (!) 137/33   Pulse (!) 106   Temp 98.6 F (37 C) (Axillary)   Resp 20   Ht '5\' 8"'$  (1.727 m)   Wt 84 kg (185 lb 3 oz)   SpO2 100%   BMI 28.16 kg/m  SpO2: SpO2: 100 % O2 Device: O2 Device: Nasal Cannula O2 Flow Rate: O2 Flow Rate (L/min): 2 L/min  Intake/output summary:   Intake/Output Summary (Last 24 hours) at 12/19/16 1307 Last data filed at 12-19-16 0800 Gross per 24 hour  Intake 678.26 ml  Output -  Net 678.26 ml   LBM: Last BM Date: 12/03/16 Baseline Weight: Weight: 90 kg (198 lb 6.6 oz) Most recent weight: Weight: 84 kg (185 lb 3 oz)       Palliative Assessment/Data:    Flowsheet Rows     Most Recent Value  Intake Tab  Referral Department  Hospitalist  Unit at Time of Referral  Intermediate Care Unit  Palliative Care Primary Diagnosis  Sepsis/Infectious Disease  Date Notified  11/29/16  Palliative Care Type  New Palliative care  Reason for referral  Clarify Goals of Care, Psychosocial or Spiritual support  Date of Admission  12/11/2016  Date first seen by Palliative Care  11/30/16  # of days  Palliative referral response time  1 Day(s)  # of days IP prior to Palliative referral  3  Clinical Assessment  Palliative Performance Scale Score  50%  Pain Max last 24 hours  0  Pain Min Last 24 hours  0  Dyspnea Max Last 24 Hours  6  Dyspnea Min Last 24 hours  1  Nausea Max Last 24 Hours  0  Nausea Min Last 24 Hours  0  Anxiety Max Last 24 Hours  0  Anxiety Min Last 24 Hours  0  Other Max Last 24 Hours  0  Psychosocial & Spiritual Assessment  Palliative Care Outcomes  Patient/Family meeting held?  Yes  Who was at the meeting?  pt, daughter, wife  Palliative Care Outcomes  Clarified goals of care, Counseled regarding hospice  Palliative Care follow-up planned  Yes, Facility      Patient Active Problem List   Diagnosis Date Noted  . ESRD on dialysis ([place])   . Goals of care, counseling/discussion   . Pressure injury of skin 12/01/2016  . Palliative care by specialist   . Severe sepsis ([place]) 11/14/2016  . Septic shock ([place])   . Diarrhea of presumed infectious origin   . Small bowel ischemia ([place])   . Chronic anticoagulation   . Dependence on renal dialysis ([place])   . Fungal rash of trunk   . Right forearm pain   . Finger pain, right   . Primary osteoarthritis of right elbow   . Hemodialysis-associated hypotension   . Labile blood glucose   . Shortness of breath   . Type 2 diabetes mellitus with peripheral neuropathy (HCC)   . PAF (paroxysmal atrial fibrillation) ([place])   . Debility 11/03/2016  . Coronary artery disease involving native heart without angina pectoris   . AKI (acute kidney injury) ([place])   . Dysphagia   . Chronic diastolic heart failure ([place])   . Benign prostatic hyperplasia   . Hx of CABG   .  S/P CABG (coronary artery bypass graft)   . Acute blood loss anemia   . Anemia of chronic disease   . Chronic obstructive pulmonary disease ([place])   . Cardiogenic shock ([place])   . Spontaneous pneumothorax   . Acute respiratory failure with hypoxia ([place])   . Acute  renal failure ([place])   . S/P CABG x 2 + maze procedure 10/09/2016  . S/P Maze operation for atrial fibrillation 10/09/2016  . Coronary artery disease involving native coronary artery of native heart with angina pectoris ([place]) 09/30/2016  . Fusarium infection ([place]) 01/10/2016  . Dyspnea on exertion 09/12/2015  . Chronic systolic CHF (congestive heart failure) ([place])   . Chronic kidney disease (CKD), stage IV (severe) ([place])   . Cellulitis of left lower extremity   . Pulmonary hypertension ([place])   . Paroxysmal atrial fibrillation (HCC)   . Other emphysema ([place])   . Bronchiectasis without complication ([place])   . Other specified hypothyroidism   . CKD (chronic kidney disease) stage 4, GFR 15-29 ml/min (HCC) 08/18/2014  . Acute on chronic systolic heart failure ([place]) 08/18/2014  . Bronchiectasis ([place]) 08/18/2014  . Hypothyroidism 08/18/2014  . Bronchiectasis without acute exacerbation ([place]) 12/29/2013  . LVH (left ventricular hypertrophy) due to hypertensive disease 12/10/2013  . Atrial fibrillation ([place]) 12/08/2013  . CHF (congestive heart failure) ([place]) 12/07/2013  . Chronic obstructive airway disease with asthma ([place]) 12/07/2013  . Thyroid goiter 02/01/2013  . Low TSH level 05/18/2012  . Right thyroid nodule 05/18/2012  . Diabetes ([place]) 05/18/2012  . HLD (hyperlipidemia) 05/18/2012  . Rheumatoid arthritis ([place]) 05/18/2012  . Essential hypertension 05/18/2012  . GERD (gastroesophageal reflux disease) 05/18/2012  . Anemia 05/18/2012  . Knee pain 02/12/2012    Palliative Care Assessment & Plan   Patient Profile: 83 y.o.malewith past medical history of CHF with ejection fraction 45-50%, COPD, HTN, DM, CAD w/ CABG 0/97/3532 (complicated by renal failure, pneumothorax; patient discharged to rehab then home). Admitted11/14/2018with diarrhea, increased shortness of breath, and fever. Patient was only home for 4 days before being readmitted. He was admitted with sepsis, likely GI source.  CT scan concerning for early distal small bowel ischemia.He has been undergoing hemodialysis during this hospitalization.  . He has been started on heparin for management of DVT left IJ and SVT in L cephalic vein.  He has been seen by surgical services secondary to small bowel ischemia concerns. . He is not a surgical candidate secondary to all of his co morbidities now with worsening leucocytosis, AMS, increased abd pain and hypotension  PMT consult ordered for goals of care.      Assessment: Patient is still alert but he is confused, complaining of abdominal pain as well as ongoing nausea and vomiting.  His clinical status has worsened markedly overnight, with lactic acid now 2.7, WBC 31.3, potassium 5.5, creatinine 4.2.  He has been too ill to undergo hemodialysis today  Recommendations/Plan: Patient is discontinuing dialysis and pursuing comfort care  Family recognizes that patient is at end-of-life  Family wishes for residential hospice, [facility].  I have notified them and they do have a bed today if medical records can be faxed and approval obtained.  Stat consult placed to social work  We will discontinue antibiotics, lab draws as well as oral medications.  Patient can no longer take oral medications secondary to pain as well as continued nausea vomiting secondary to ischemic bowel   Manage pain with as needed Dilaudid 0.25 mg to  0.5 mg every 2 hours as needed for pain or shortness of breath  Anxiety: Continue with Ativan 0.5-1 mg every 4 hours as needed  Secretions: We will add Robinul 0.2 mg every 4 hours as needed for secretions.  Monitor for need for scheduled dosing  Goals of Care and Additional Recommendations:  Limitations on Scope of Treatment: Full Comfort Care  Code Status:    Code Status Orders  (From admission, onward)        Start     Ordered   11/16/2016 1859  Do not attempt resuscitation (DNR)  Continuous    Question Answer  Comment  In the event of cardiac or respiratory ARREST Do not call a "code blue"   In the event of cardiac or respiratory ARREST Do not perform Intubation, CPR, defibrillation or ACLS   In the event of cardiac or respiratory ARREST Use medication by any route, position, wound care, and other measures to relive pain and suffering. May use oxygen, suction and manual treatment of airway obstruction as needed for comfort.   Comments Pressors OK      12/11/2016 1858    Code Status History    Date Active Date Inactive Code Status Order ID Comments User Context   11/29/2016 18:37 12/10/2016 18:58 DNR [id number]  Corey Harold, NP ED   11/03/2016 17:06 11/21/2016 16:23 Full Code [id number]  Cathlyn Parsons, PA-C Inpatient   11/03/2016 17:06 11/03/2016 17:06 Full Code [id number]  Cathlyn Parsons, PA-C Inpatient   10/09/2016 15:01 11/03/2016 16:39 Full Code [id number]  Rexene Alberts, MD Inpatient   09/30/2016 19:44 10/01/2016 19:46 Full Code [id number]  Nigel Mormon, MD Inpatient   12/01/2015 12:00 12/02/2015 16:00 Full Code [id number]  Burgess Estelle, MD Inpatient   08/18/2014 16:31 08/22/2014 13:56 Full Code [id number]  Caren Griffins, MD ED   12/07/2013 19:13 12/13/2013 20:38 Partial Code [id number]  Bethena Roys, MD Inpatient   02/01/2013 12:49 02/02/2013 15:17 Full Code [id number]  Armandina Gemma, MD Inpatient       Prognosis:   < 2 weeks in the setting of acute kidney injury, stopping dialysis, hypotension, ischemic bowel, DVT in left internal jugular and cephalic vein; sepsis  Discharge Planning:  Hospice facility  Care plan was discussed with Dr. Horris Latino  Thank you for allowing the Palliative Medicine Team to assist in the care of this patient.   Time In: 1200 Time Out: 1320 Total Time 80 min Prolonged Time Billed  yes       Greater than 50%  of this time was spent counseling and coordinating care related to the above assessment and plan.  Dory Horn,  NP  Please contact Palliative Medicine Team phone at [phone] for questions and concerns.

## 2016-12-13 NOTE — Progress Notes (Signed)
Patient passed at 1555. Patient was a DNR and made comfort care today. Family at bedside at time of death. MD notified. Patient will be transferred to [facility].

## 2016-12-13 NOTE — Progress Notes (Signed)
ANTICOAGULATION CONSULT NOTE - Follow Up Consult  Pharmacy Consult for Coumadin Indication: atrial fibrillation and new LUE DVT  No Known Allergies  Patient Measurements: Height: 5\' 8"  (172.7 cm) Weight: 185 lb 3 oz (84 kg) IBW/kg (Calculated) : 68.4 Heparin Dosing Weight: 84.6 kg  Vital Signs: Temp: 97.6 F (36.4 C) (11/23 0744) Temp Source: Oral (11/23 0744) BP: 115/60 (11/23 0744) Pulse Rate: 106 (11/23 0744)  Labs: Recent Labs    12/03/16 1920 12/04/16 0329 12/04/16 1129 Dec 19, 2016 0349  HGB  --  9.1* 8.1* 8.7*  HCT  --  28.8* 26.1* 27.7*  PLT  --  273 265 288  LABPROT  --  24.8* 29.6* 36.1*  INR  --  2.26 2.84 3.67  HEPARINUNFRC 0.33 0.37  --  0.35  CREATININE  --  2.95* 3.53* 4.20*    Estimated Creatinine Clearance: 13.8 mL/min (A) (by C-G formula based on SCr of 4.2 mg/dL (H)).  Assessment: 83 year old male on chronic anticoagulation with Coumadin for atrial fibrillation. He also developed a LUE DVT this admission while his INR was supratherapeutic. He is currently receiving a heparin bridge to Coumadin.  INR is up to 3.67 today. Significant INR increase. Discussed with Dr. Horris Latino - today is Day #4 of overlap but with deteriorating clinical condition he is at risk of a bleeding complication. Will stop heparin now.  He is clinically worse today and his WBC is up to 31.3. Request received to restart broad-spectrum coverage with Vancomycin and Zosyn. Will dose for an anticipated HD schedule of TTS.  Goal of Therapy:  INR 2-3 Monitor platelets by anticoagulation protocol: Yes   Plan:  Discontinue Heparin No Coumadin today Daily PT/INR monitoring  Vancomycin 1750mg  IV x 1 load, then 1g IV after each HD session Zosyn 3.375g IV x 1 dose now, then 3.375g IV q12h extended infusion Monitor for HD plans and tolerance Follow available micro data  Norva Riffle December 19, 2016, 9:49 AM

## 2016-12-13 DEATH — deceased

## 2016-12-17 ENCOUNTER — Encounter (HOSPITAL_COMMUNITY): Payer: Medicare Other

## 2016-12-17 ENCOUNTER — Encounter: Payer: Medicare Other | Admitting: Vascular Surgery

## 2016-12-19 ENCOUNTER — Inpatient Hospital Stay: Payer: Medicare Other | Admitting: Physical Medicine & Rehabilitation

## 2018-05-11 ENCOUNTER — Other Ambulatory Visit: Payer: Self-pay | Admitting: *Deleted

## 2018-06-30 IMAGING — DX DG CHEST 1V PORT
1 series · 1 of 1 positions shown · non-contrast
Comparison: Portable chest x-ray October 16, 2016

CLINICAL DATA: Status post placement of a right subclavian venous
catheter.

EXAM:
PORTABLE CHEST 1 VIEW

[chest]
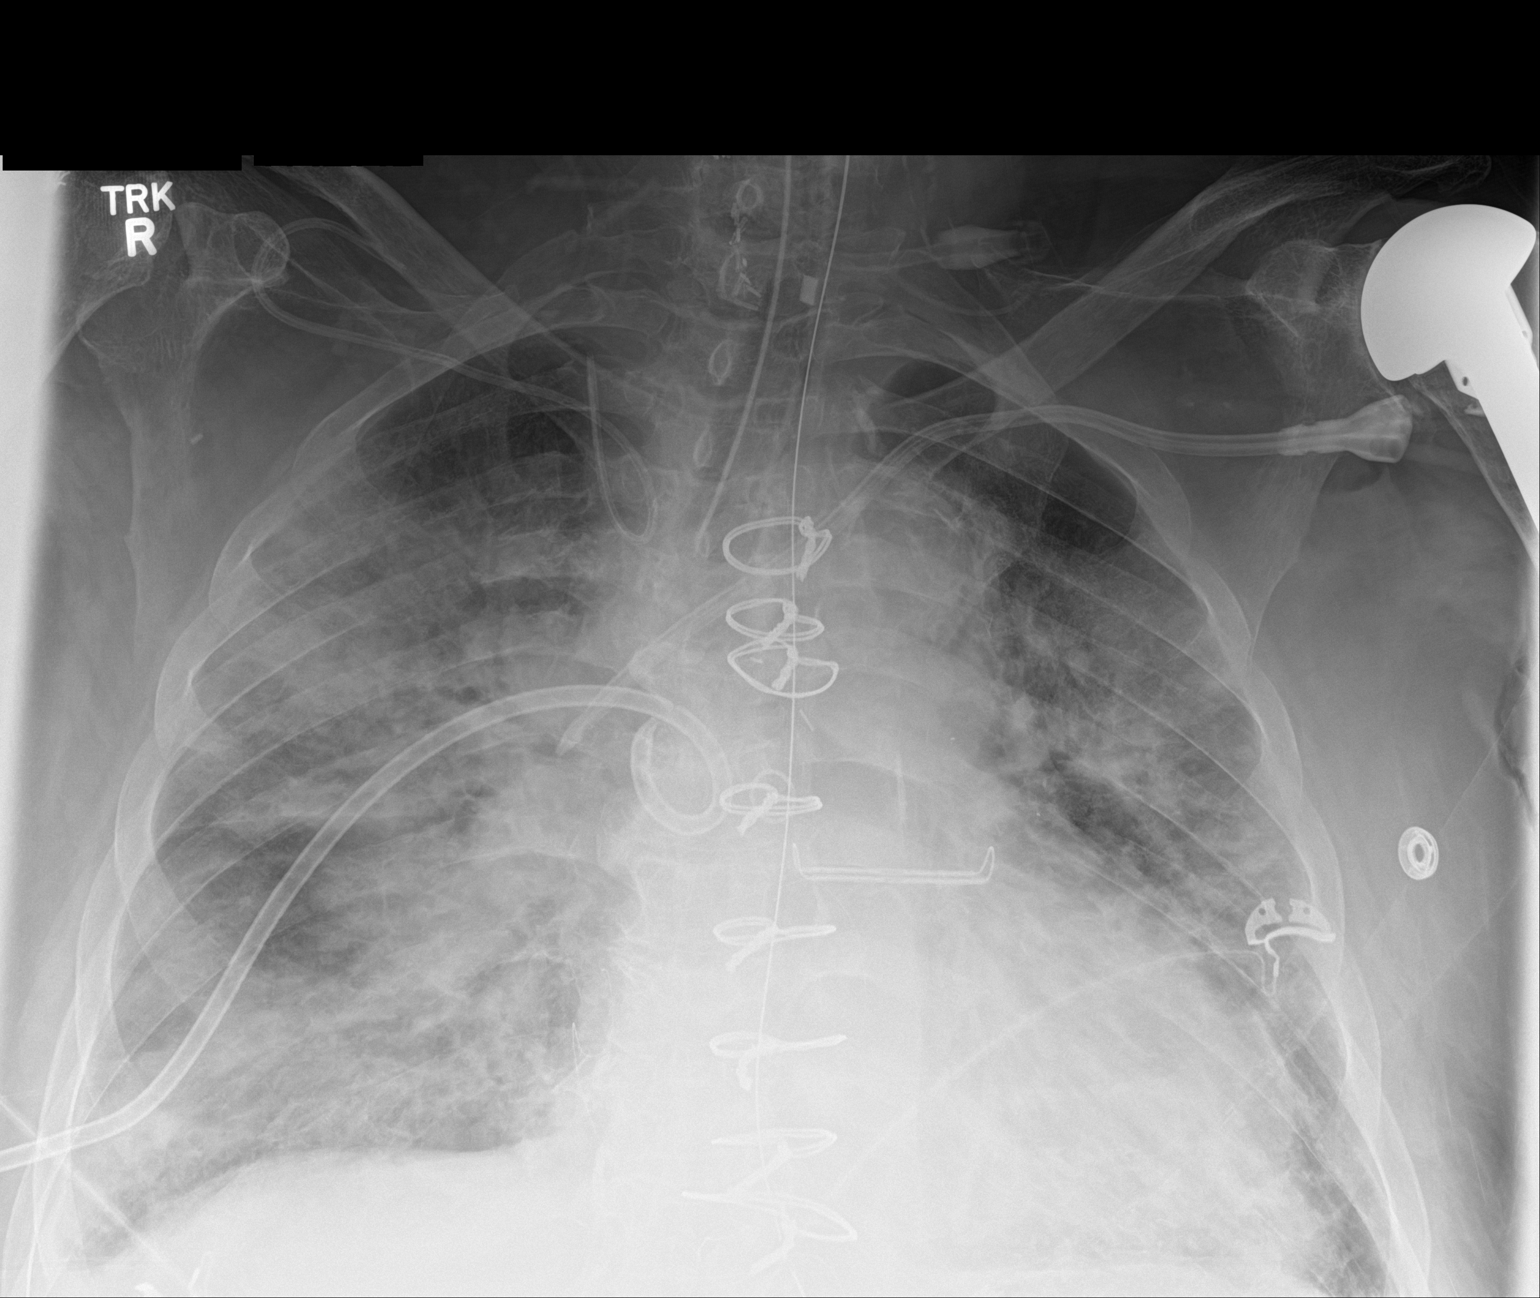

[1 of 1 positions shown; findings below may reference images not displayed]

FINDINGS: The subclavian venous catheter tip is oriented in a cephalad
direction. There is no postprocedure pneumothorax. The fluffy
alveolar opacities persist bilaterally. The right chest tube is in
stable position. The dialysis catheter, endotracheal tube, and
esophagogastric tubes are in stable position.
IMPRESSION: The right subclavian venous catheter tip has re- curved upon itself
and is oriented in a superior direction into the inferior aspect of
the right internal jugular vein. Withdrawal and repositioning is
needed.

The chest and the other support apparatus appear stable.

These results were called by telephone at the time of interpretation
on 10/16/2016 at [DATE] to Shikolenko Alumaa, RN,, who verbally
acknowledged these results.

## 2018-06-30 IMAGING — DX DG CHEST 1V PORT
1 series · 1 of 1 positions shown · non-contrast
Comparison: October 16, 2016 study obtained earlier in the day

CLINICAL DATA: Respiratory distress

EXAM:
PORTABLE CHEST 1 VIEW

[chest]
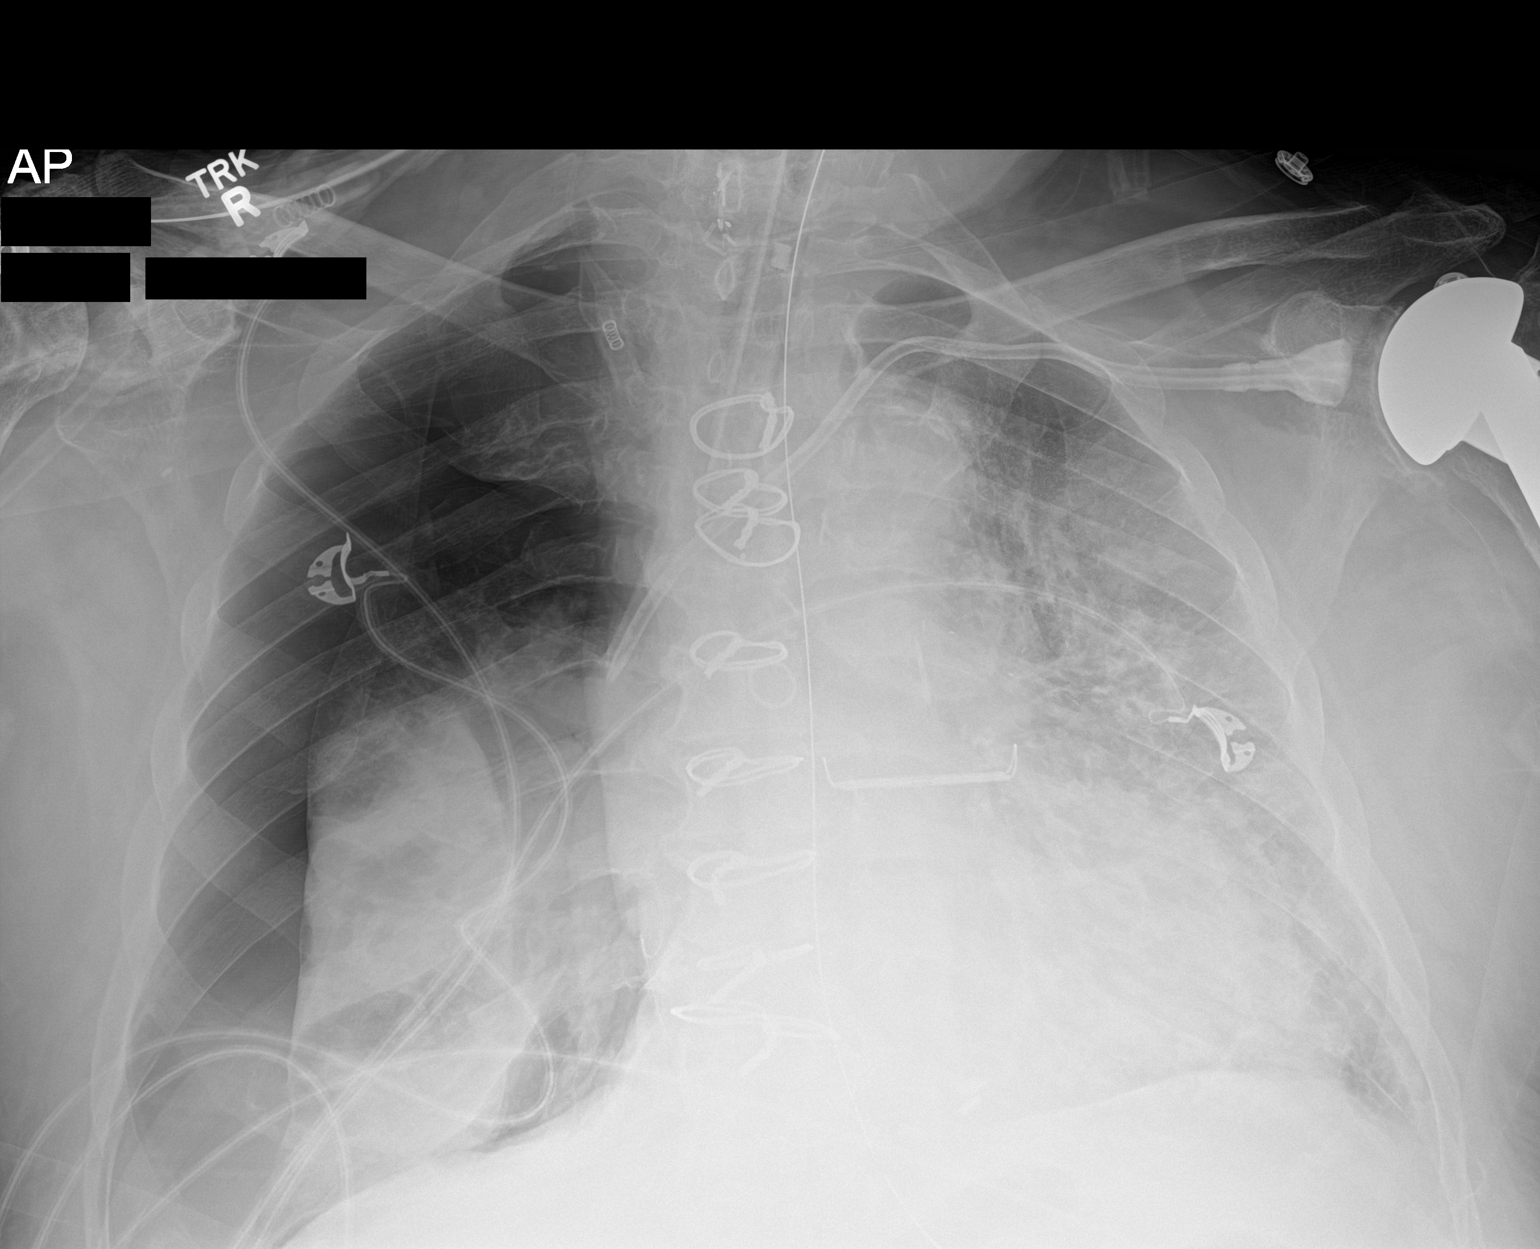

[1 of 1 positions shown; findings below may reference images not displayed]

FINDINGS: Endotracheal tube tip is 1.4 cm above the carina. Central catheter
tip is in the superior vena cava. There is a large pneumothorax on
the right with mild tension component.

There is widespread alveolar opacity bilaterally, likely due to
edema, possibly with a degree of underlying pneumonia and/or ARDS.
There is cardiomegaly with pulmonary venous hypertension. Patient is
status post coronary artery bypass grafting with left atrial
appendage clamp present. There is a total shoulder replacement on
left. There is carotid artery calcification on the left. There are
surgical clips in the right cervical-thoracic junction.
IMPRESSION: 1.  Large right-sided pneumothorax with tension component.

2.  Tube and catheter positions as described.

3. Widespread alveolar opacity, likely diffuse edema, potentially
with underlying pneumonia and/ or ARDS. More than one of these
entities may exist concurrently.

4. Evidence of underlying cardiomegaly with pulmonary venous
hypertension. There is aortic atherosclerosis. There is left carotid
artery calcification.

Critical Value/emergent results were called by telephone at the time
of interpretation on 10/16/2016 at [DATE] to Zhonglei Sossa, RN ,
who verbally acknowledged these results. She report to me that chest
tube was being placed at the time of the call.

Aortic Atherosclerosis ([id number]).

## 2018-06-30 IMAGING — DX DG CHEST 1V PORT
1 series · 1 of 1 positions shown · non-contrast
Comparison: [DATE]/[id number] hours

CLINICAL DATA: Right chest tube placement

EXAM:
PORTABLE CHEST 1 VIEW

[chest]
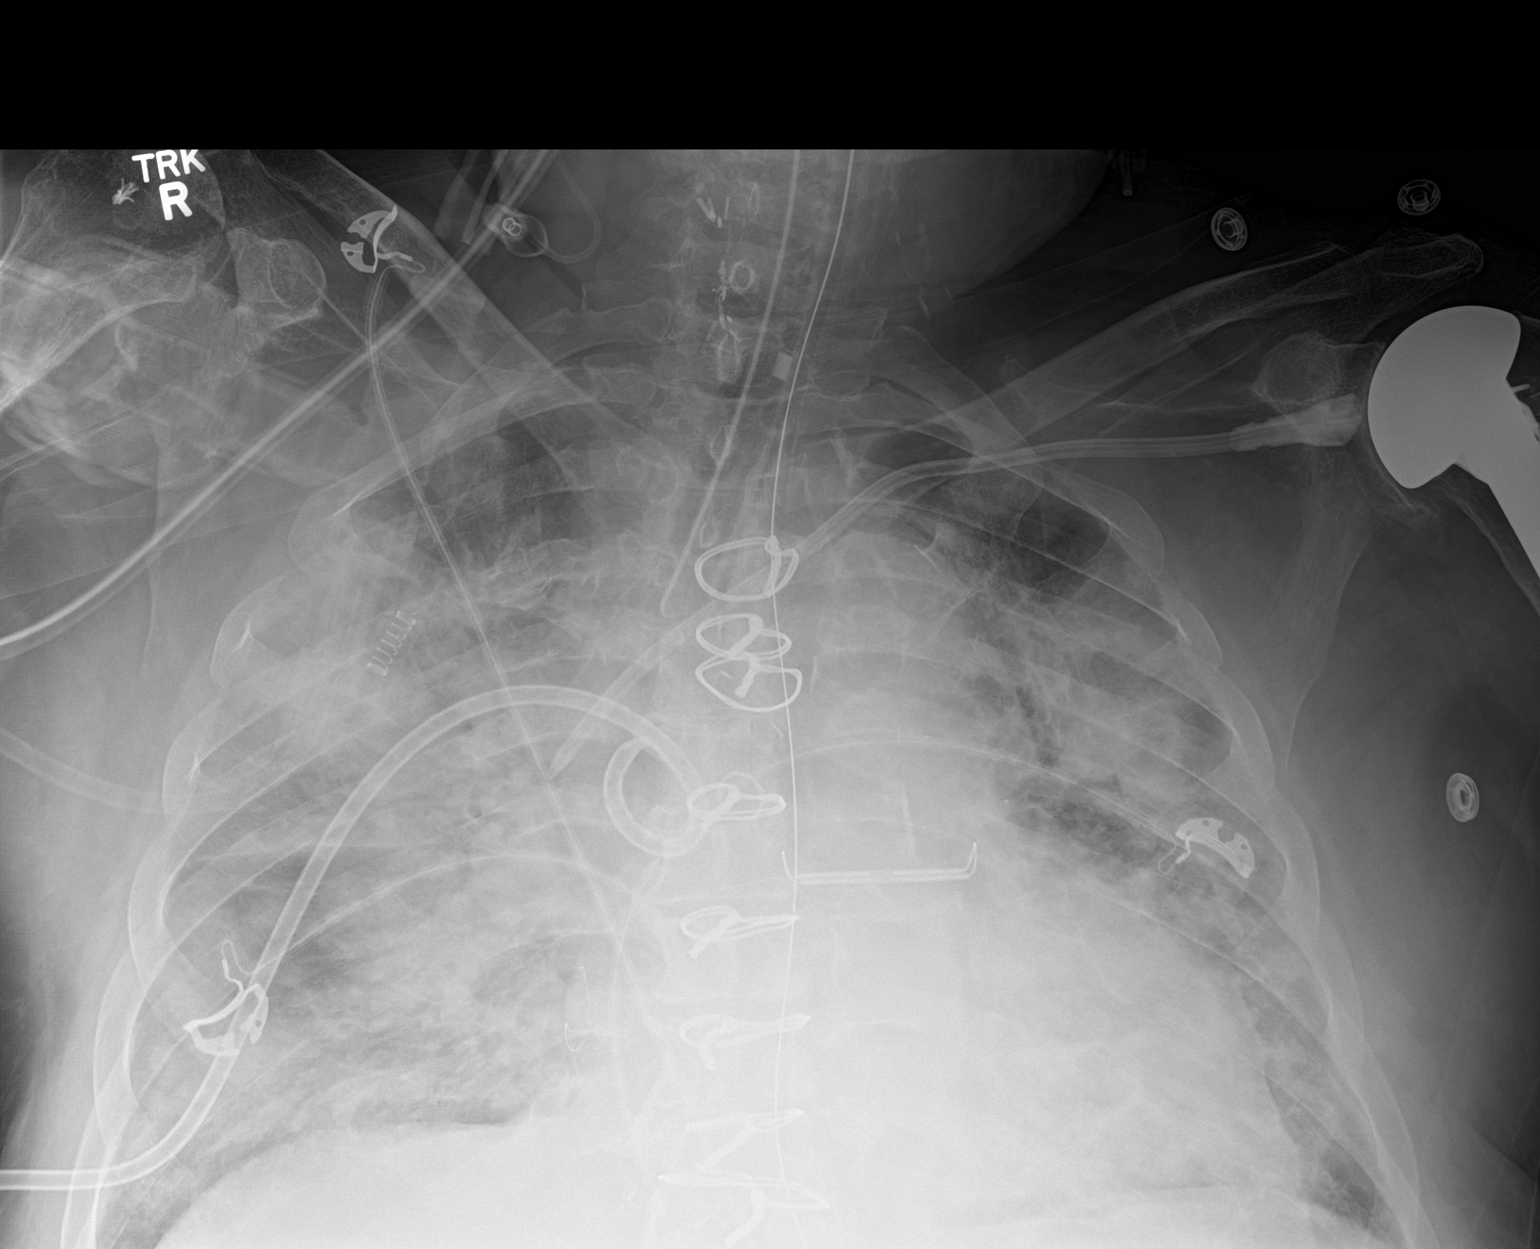

[1 of 1 positions shown; findings below may reference images not displayed]

FINDINGS: Right sided pigtail chest tube has been placed which now projects
over the right mid hemithorax. The right pneumothorax has resolved.
Diffuse bilateral airspace disease is not significantly changed.
Endotracheal and NG tubes are stable. Left subclavian dialysis
catheter is stable. The heart is upper normal in size. No definite
pleural effusion.
IMPRESSION: Right chest tube placed and the right pneumothorax has resolved.

Stable diffuse bilateral airspace disease.

## 2018-06-30 IMAGING — DX DG CHEST 1V PORT
1 series · 1 of 1 positions shown · non-contrast
Comparison: Chest x-ray 10/15/2016.

CLINICAL DATA: 85-year-old male with history of respiratory
failure.

EXAM:
PORTABLE CHEST 1 VIEW

[chest ap]
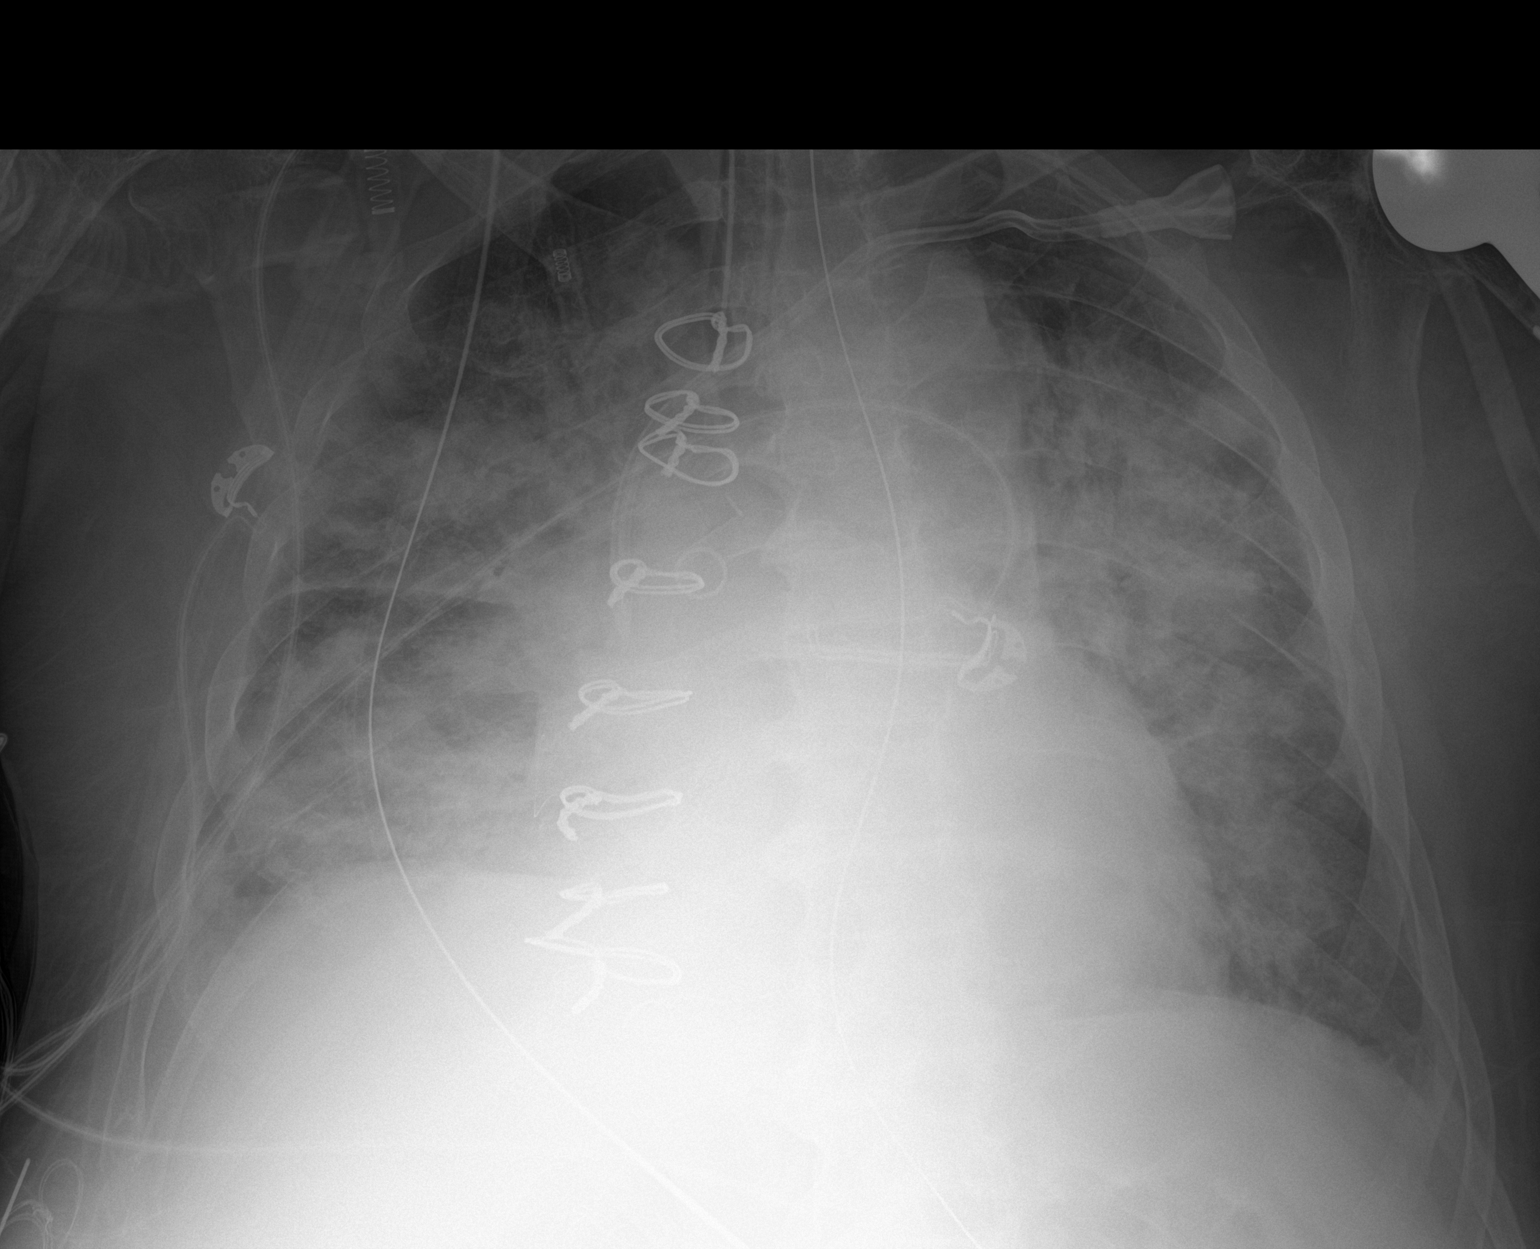

[1 of 1 positions shown; findings below may reference images not displayed]

FINDINGS: An endotracheal tube is in place with tip 5.2 cm above the carina. A
nasogastric tube is seen extending into the stomach, however, the
tip of the nasogastric tube extends below the lower margin of the
image. There is a right-sided internal jugular central venous
catheter with tip terminating in the internal jugular vein. There is
a left-sided subclavian Vas-Cath with tip terminating in the
superior cavoatrial junction. Lung volumes are low. Worsening patchy
multifocal interstitial and airspace disease throughout the lungs
bilaterally concerning for worsening pulmonary edema. Small
bilateral pleural effusions (right greater than left). Mild
cardiomegaly. The patient is rotated to the right on today's exam,
resulting in distortion of the mediastinal contours and reduced
diagnostic sensitivity and specificity for mediastinal pathology.
Status post median sternotomy for CABG. Left atrial appendage
ligation clip also noted.
IMPRESSION: 1. Support apparatus and postoperative changes, as above.
2. The appearance the chest suggests worsening congestive heart
failure, as above.

## 2018-07-01 IMAGING — DX DG ABD PORTABLE 1V
1 series · 1 of 1 positions shown · non-contrast
Comparison: 10/16/2016

CLINICAL DATA: Follow-up ileus

EXAM:
PORTABLE ABDOMEN - 1 VIEW

[abdomen kub]
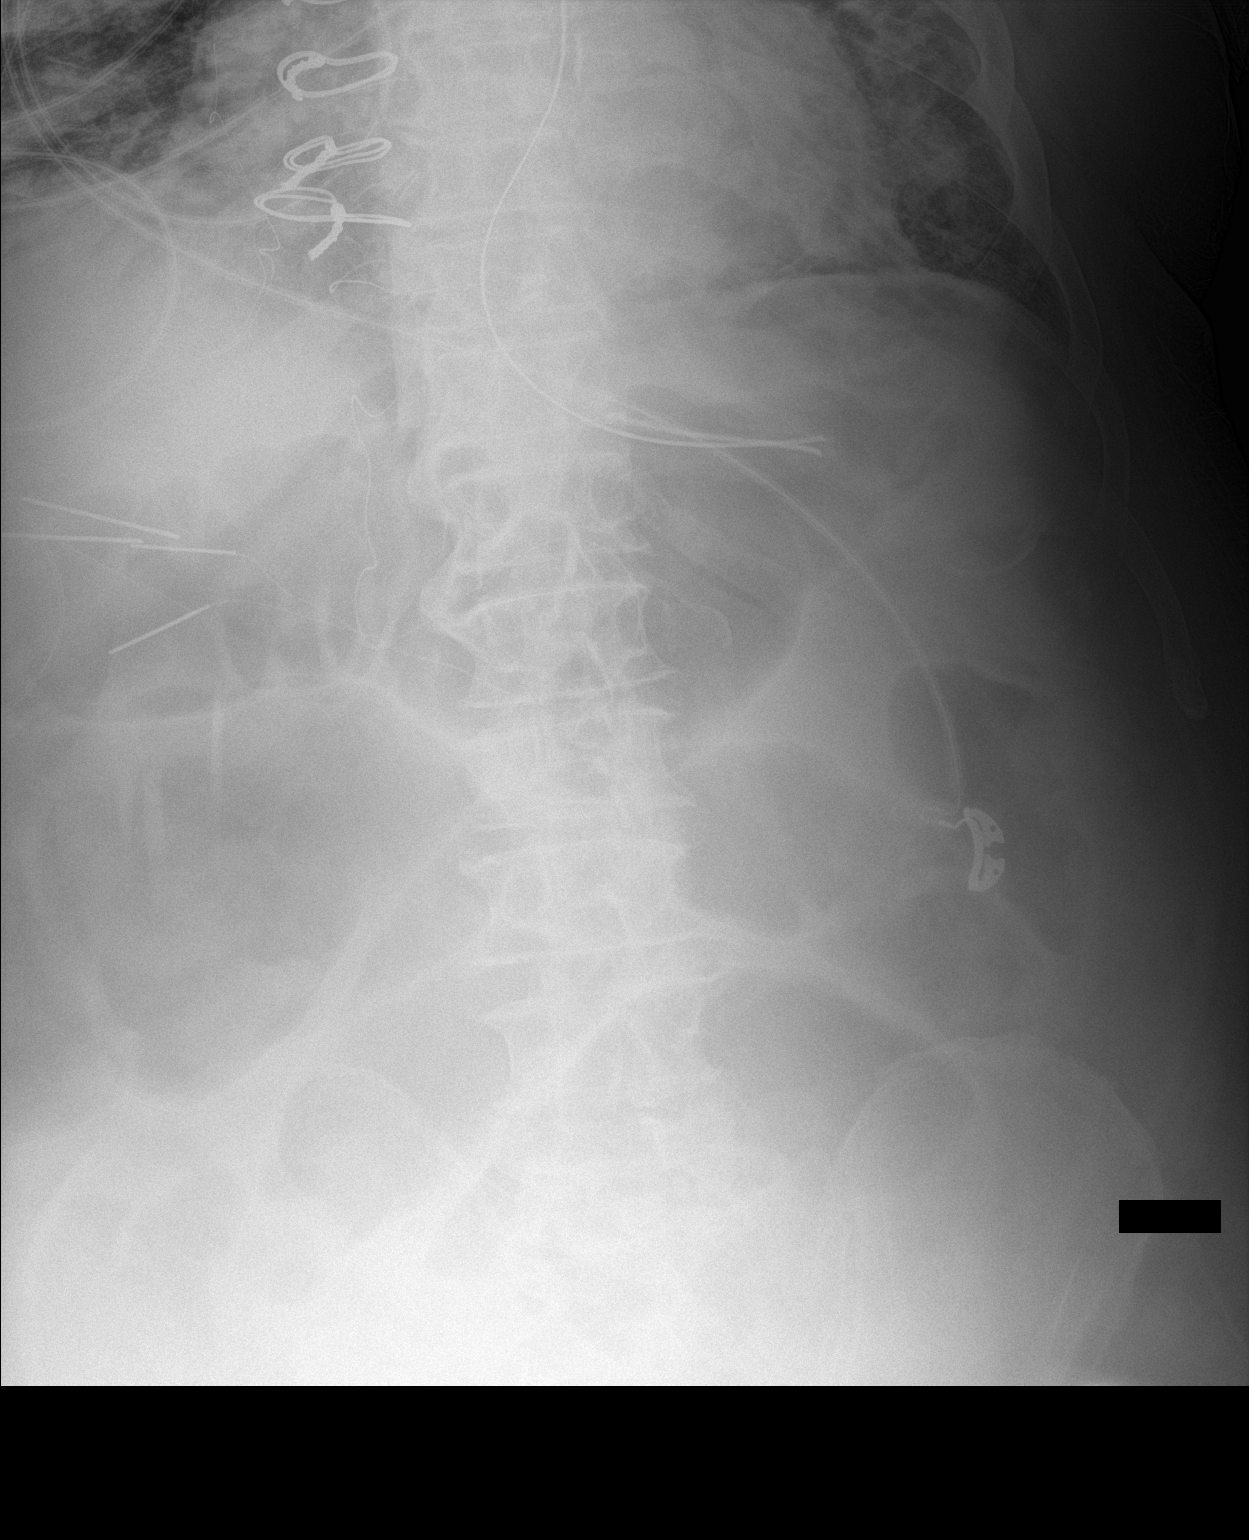

[1 of 1 positions shown; findings below may reference images not displayed]

FINDINGS: Scattered large and small bowel gas is noted. No obstructive changes
are seen. No free air is noted. Nasogastric catheter is noted within
the stomach. Degenerative changes of lumbar spine are seen.
IMPRESSION: Scattered bowel gas particularly within the colon which could
represent an ileus. Correlation with physical exam is recommended.
